# Patient Record
Sex: Female | Born: 1935 | Race: White | Hispanic: No | Marital: Married | Smoking: Former smoker
Health system: Southern US, Community
[De-identification: ages and names within clinical notes are randomized; demographics above are authoritative.]

## PROBLEM LIST (undated history)

## (undated) DIAGNOSIS — J439 Emphysema, unspecified: Secondary | ICD-10-CM

## (undated) DIAGNOSIS — J45909 Unspecified asthma, uncomplicated: Secondary | ICD-10-CM

## (undated) DIAGNOSIS — C3490 Malignant neoplasm of unspecified part of unspecified bronchus or lung: Secondary | ICD-10-CM

## (undated) DIAGNOSIS — E039 Hypothyroidism, unspecified: Secondary | ICD-10-CM

## (undated) DIAGNOSIS — F419 Anxiety disorder, unspecified: Secondary | ICD-10-CM

## (undated) DIAGNOSIS — E785 Hyperlipidemia, unspecified: Secondary | ICD-10-CM

## (undated) DIAGNOSIS — J189 Pneumonia, unspecified organism: Secondary | ICD-10-CM

## (undated) DIAGNOSIS — IMO0002 Reserved for concepts with insufficient information to code with codable children: Secondary | ICD-10-CM

## (undated) DIAGNOSIS — J42 Unspecified chronic bronchitis: Secondary | ICD-10-CM

## (undated) DIAGNOSIS — R197 Diarrhea, unspecified: Secondary | ICD-10-CM

## (undated) DIAGNOSIS — K219 Gastro-esophageal reflux disease without esophagitis: Secondary | ICD-10-CM

## (undated) DIAGNOSIS — N312 Flaccid neuropathic bladder, not elsewhere classified: Secondary | ICD-10-CM

## (undated) DIAGNOSIS — J449 Chronic obstructive pulmonary disease, unspecified: Secondary | ICD-10-CM

## (undated) DIAGNOSIS — Z9981 Dependence on supplemental oxygen: Secondary | ICD-10-CM

## (undated) DIAGNOSIS — M1990 Unspecified osteoarthritis, unspecified site: Secondary | ICD-10-CM

## (undated) HISTORY — DX: Unspecified osteoarthritis, unspecified site: M19.90

## (undated) HISTORY — PX: DILATION AND CURETTAGE OF UTERUS: SHX78

## (undated) HISTORY — DX: Emphysema, unspecified: J43.9

## (undated) HISTORY — DX: Hypothyroidism, unspecified: E03.9

## (undated) HISTORY — DX: Malignant neoplasm of unspecified part of unspecified bronchus or lung: C34.90

## (undated) HISTORY — DX: Unspecified asthma, uncomplicated: J45.909

## (undated) HISTORY — DX: Flaccid neuropathic bladder, not elsewhere classified: N31.2

## (undated) HISTORY — DX: Reserved for concepts with insufficient information to code with codable children: IMO0002

## (undated) HISTORY — PX: BACK SURGERY: SHX140

## (undated) HISTORY — PX: CATARACT EXTRACTION W/ INTRAOCULAR LENS  IMPLANT, BILATERAL: SHX1307

## (undated) HISTORY — DX: Hyperlipidemia, unspecified: E78.5

## (undated) HISTORY — DX: Chronic obstructive pulmonary disease, unspecified: J44.9

## (undated) HISTORY — PX: ABDOMINAL HYSTERECTOMY: SHX81

---

## 1969-02-18 HISTORY — PX: LUMBAR DISC SURGERY: SHX700

## 1999-08-19 ENCOUNTER — Encounter: Payer: Self-pay | Admitting: Obstetrics and Gynecology

## 1999-08-19 ENCOUNTER — Encounter: Admission: RE | Admit: 1999-08-19 | Discharge: 1999-08-19 | Payer: Self-pay | Admitting: Obstetrics and Gynecology

## 1999-11-01 ENCOUNTER — Other Ambulatory Visit: Admission: RE | Admit: 1999-11-01 | Discharge: 1999-11-01 | Payer: Self-pay | Admitting: Obstetrics and Gynecology

## 2000-11-27 ENCOUNTER — Encounter: Admission: RE | Admit: 2000-11-27 | Discharge: 2000-11-27 | Payer: Self-pay | Admitting: Obstetrics and Gynecology

## 2000-11-27 ENCOUNTER — Encounter: Payer: Self-pay | Admitting: Obstetrics and Gynecology

## 2001-06-05 ENCOUNTER — Encounter: Payer: Self-pay | Admitting: Internal Medicine

## 2001-06-05 ENCOUNTER — Encounter: Admission: RE | Admit: 2001-06-05 | Discharge: 2001-06-05 | Payer: Self-pay | Admitting: Internal Medicine

## 2001-11-28 ENCOUNTER — Encounter: Payer: Self-pay | Admitting: Obstetrics and Gynecology

## 2001-11-28 ENCOUNTER — Encounter: Admission: RE | Admit: 2001-11-28 | Discharge: 2001-11-28 | Payer: Self-pay | Admitting: Obstetrics and Gynecology

## 2001-12-04 ENCOUNTER — Encounter: Admission: RE | Admit: 2001-12-04 | Discharge: 2001-12-04 | Payer: Self-pay | Admitting: Obstetrics and Gynecology

## 2001-12-04 ENCOUNTER — Encounter: Payer: Self-pay | Admitting: Obstetrics and Gynecology

## 2002-12-10 ENCOUNTER — Encounter: Admission: RE | Admit: 2002-12-10 | Discharge: 2002-12-10 | Payer: Self-pay | Admitting: Obstetrics and Gynecology

## 2002-12-10 ENCOUNTER — Encounter: Payer: Self-pay | Admitting: Obstetrics and Gynecology

## 2003-06-21 DIAGNOSIS — C3490 Malignant neoplasm of unspecified part of unspecified bronchus or lung: Secondary | ICD-10-CM

## 2003-06-21 HISTORY — PX: LYMPH NODE DISSECTION: SHX5087

## 2003-06-21 HISTORY — DX: Malignant neoplasm of unspecified part of unspecified bronchus or lung: C34.90

## 2003-08-22 ENCOUNTER — Encounter: Admission: RE | Admit: 2003-08-22 | Discharge: 2003-08-22 | Payer: Self-pay | Admitting: Internal Medicine

## 2003-09-11 ENCOUNTER — Ambulatory Visit (HOSPITAL_COMMUNITY): Admission: RE | Admit: 2003-09-11 | Discharge: 2003-09-11 | Payer: Self-pay | Admitting: Thoracic Surgery

## 2003-09-19 HISTORY — PX: VIDEO ASSISTED THORACOSCOPY (VATS)/THOROCOTOMY: SHX6173

## 2003-09-22 ENCOUNTER — Encounter (INDEPENDENT_AMBULATORY_CARE_PROVIDER_SITE_OTHER): Payer: Self-pay | Admitting: *Deleted

## 2003-09-22 ENCOUNTER — Inpatient Hospital Stay (HOSPITAL_COMMUNITY): Admission: RE | Admit: 2003-09-22 | Discharge: 2003-09-28 | Payer: Self-pay | Admitting: Thoracic Surgery

## 2003-10-03 ENCOUNTER — Encounter: Admission: RE | Admit: 2003-10-03 | Discharge: 2003-10-03 | Payer: Self-pay | Admitting: Thoracic Surgery

## 2003-10-23 ENCOUNTER — Encounter: Admission: RE | Admit: 2003-10-23 | Discharge: 2003-10-23 | Payer: Self-pay | Admitting: Thoracic Surgery

## 2003-10-26 ENCOUNTER — Inpatient Hospital Stay (HOSPITAL_COMMUNITY): Admission: EM | Admit: 2003-10-26 | Discharge: 2003-10-29 | Payer: Self-pay | Admitting: Emergency Medicine

## 2003-11-06 ENCOUNTER — Encounter: Admission: RE | Admit: 2003-11-06 | Discharge: 2003-11-06 | Payer: Self-pay | Admitting: Thoracic Surgery

## 2003-11-13 ENCOUNTER — Encounter: Admission: RE | Admit: 2003-11-13 | Discharge: 2003-11-13 | Payer: Self-pay | Admitting: Thoracic Surgery

## 2003-12-04 ENCOUNTER — Encounter: Admission: RE | Admit: 2003-12-04 | Discharge: 2003-12-04 | Payer: Self-pay | Admitting: Thoracic Surgery

## 2003-12-15 ENCOUNTER — Encounter: Admission: RE | Admit: 2003-12-15 | Discharge: 2003-12-15 | Payer: Self-pay | Admitting: Internal Medicine

## 2004-01-14 ENCOUNTER — Encounter: Admission: RE | Admit: 2004-01-14 | Discharge: 2004-01-14 | Payer: Self-pay | Admitting: Thoracic Surgery

## 2004-03-15 ENCOUNTER — Ambulatory Visit (HOSPITAL_COMMUNITY): Admission: RE | Admit: 2004-03-15 | Discharge: 2004-03-15 | Payer: Self-pay | Admitting: Oncology

## 2004-03-16 ENCOUNTER — Encounter: Admission: RE | Admit: 2004-03-16 | Discharge: 2004-03-16 | Payer: Self-pay | Admitting: Thoracic Surgery

## 2004-03-20 HISTORY — PX: VIDEO BRONCHOSCOPY: SHX5072

## 2004-03-31 ENCOUNTER — Ambulatory Visit (HOSPITAL_COMMUNITY): Admission: RE | Admit: 2004-03-31 | Discharge: 2004-03-31 | Payer: Self-pay | Admitting: Thoracic Surgery

## 2004-03-31 ENCOUNTER — Encounter (INDEPENDENT_AMBULATORY_CARE_PROVIDER_SITE_OTHER): Payer: Self-pay | Admitting: Specialist

## 2004-04-07 ENCOUNTER — Ambulatory Visit (HOSPITAL_COMMUNITY): Admission: RE | Admit: 2004-04-07 | Discharge: 2004-04-07 | Payer: Self-pay | Admitting: Oncology

## 2004-04-28 ENCOUNTER — Ambulatory Visit: Payer: Self-pay | Admitting: Oncology

## 2004-06-29 ENCOUNTER — Encounter: Admission: RE | Admit: 2004-06-29 | Discharge: 2004-06-29 | Payer: Self-pay | Admitting: Thoracic Surgery

## 2004-07-20 ENCOUNTER — Ambulatory Visit: Payer: Self-pay | Admitting: Oncology

## 2004-07-21 ENCOUNTER — Ambulatory Visit (HOSPITAL_COMMUNITY): Admission: RE | Admit: 2004-07-21 | Discharge: 2004-07-21 | Payer: Self-pay | Admitting: Oncology

## 2004-08-27 ENCOUNTER — Encounter: Admission: RE | Admit: 2004-08-27 | Discharge: 2004-08-27 | Payer: Self-pay | Admitting: Internal Medicine

## 2004-09-28 ENCOUNTER — Encounter: Admission: RE | Admit: 2004-09-28 | Discharge: 2004-09-28 | Payer: Self-pay | Admitting: Thoracic Surgery

## 2004-11-23 ENCOUNTER — Ambulatory Visit: Payer: Self-pay | Admitting: Oncology

## 2004-11-24 ENCOUNTER — Ambulatory Visit (HOSPITAL_COMMUNITY): Admission: RE | Admit: 2004-11-24 | Discharge: 2004-11-24 | Payer: Self-pay | Admitting: Oncology

## 2004-12-27 ENCOUNTER — Encounter: Admission: RE | Admit: 2004-12-27 | Discharge: 2004-12-27 | Payer: Self-pay | Admitting: Internal Medicine

## 2005-03-29 ENCOUNTER — Ambulatory Visit: Payer: Self-pay | Admitting: Oncology

## 2005-03-30 ENCOUNTER — Encounter: Admission: RE | Admit: 2005-03-30 | Discharge: 2005-03-30 | Payer: Self-pay | Admitting: Thoracic Surgery

## 2005-04-01 ENCOUNTER — Encounter: Admission: RE | Admit: 2005-04-01 | Discharge: 2005-04-01 | Payer: Self-pay | Admitting: Oncology

## 2005-04-05 IMAGING — CT NM MISC PROCEDURE
5 series · 25 of 25 positions shown · IV contrast ([ID])
Comparison: None.

CLINICAL DATA: Right upper lobe pulmonary nodule seen on CT.
 FDG PET-CT TUMOR IMAGING (SKULL BASE TO THIGHS)
TECHNIQUE: 16.5 mCi F-18 FDG were administered via the left arm.  Full-ring PET imaging was performed from the skull base through the mid-thighs 55 minutes after injection.  CT data was obtained and used for attenuation correction and anatomic localization only.  (This was not acquired as a diagnostic CT examination.)

[Series 1: pet ac · axial · 3.3mm · 4.69mm/px · z∈[-870,+0]mm · 7 of 267 slices shown]
[im 1/267]
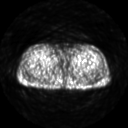
[im 45/267]
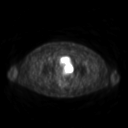
[im 89/267]
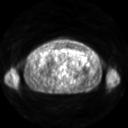
[im 134/267]
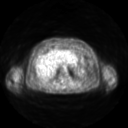
[im 178/267]
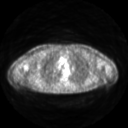
[im 222/267]
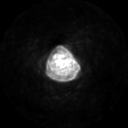
[im 267/267]
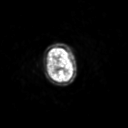

[Series 2: ct images · axial · 3.8mm · 0.98mm/px · z∈[-870,+0]mm · 8 of 267 slices shown]
[im 1/267  soft-tissue]
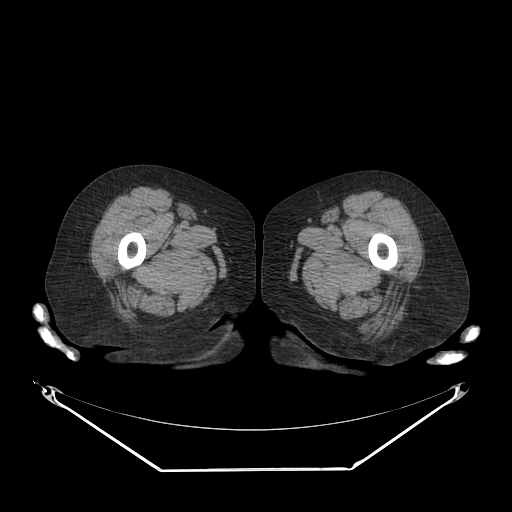
[im 39/267  soft-tissue]
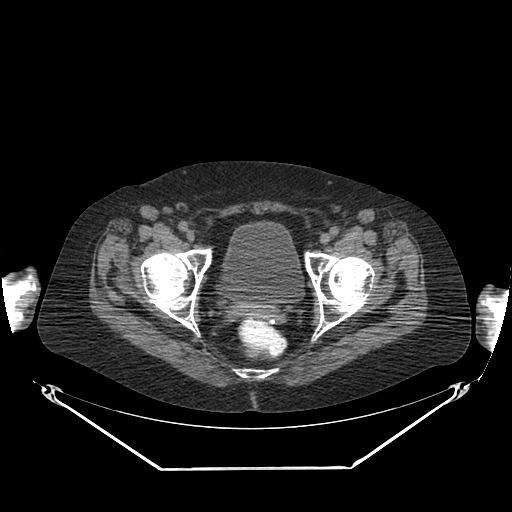
[im 77/267  soft-tissue]
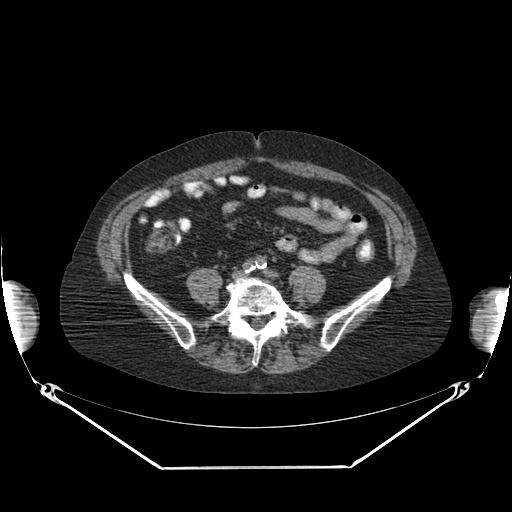
[im 115/267  soft-tissue]
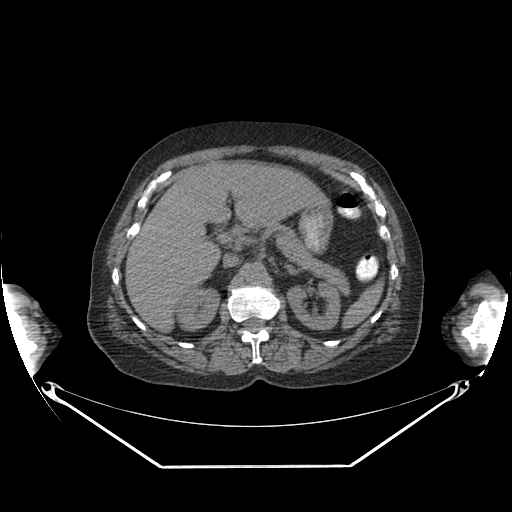
[im 153/267  soft-tissue]
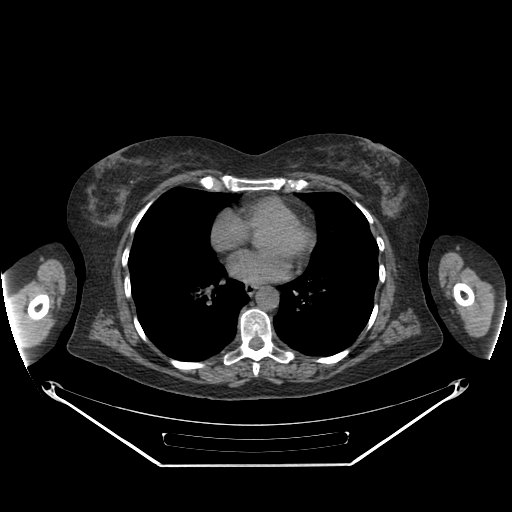
[im 191/267  soft-tissue]
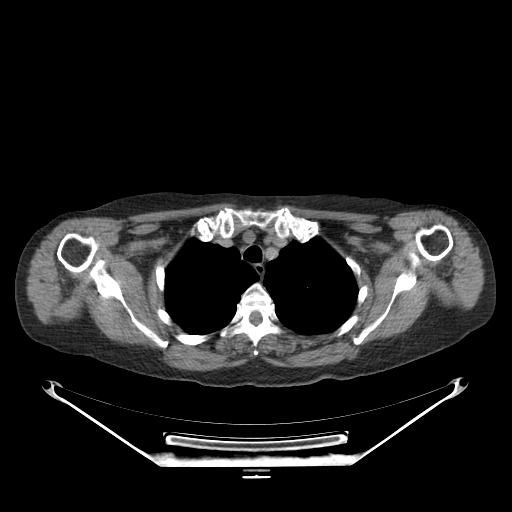
[im 229/267  soft-tissue]
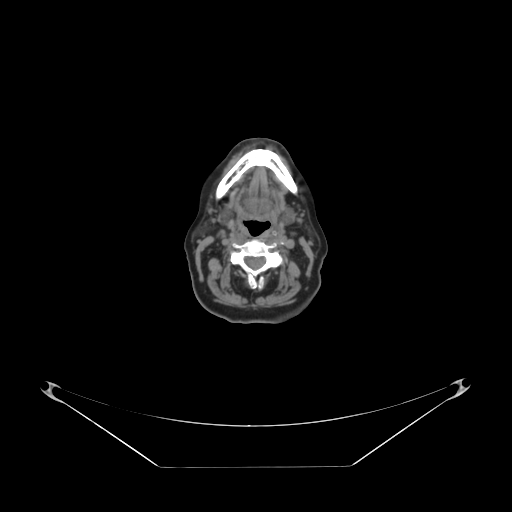
[im 267/267  soft-tissue]
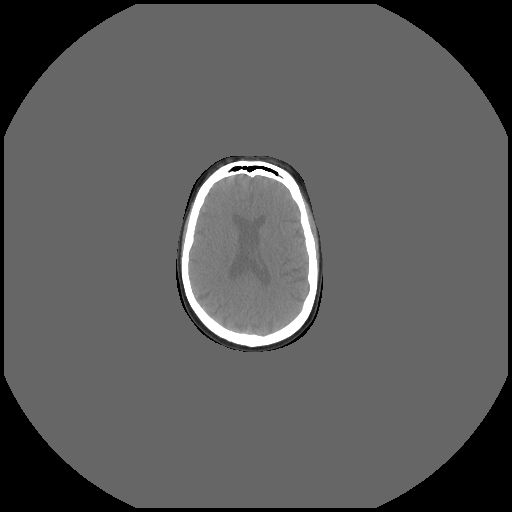

[Series 2: pet nac · axial · 3.3mm · 4.69mm/px · z∈[-870,+0]mm · 8 of 267 slices shown]
[im 1/267]
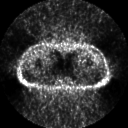
[im 39/267]
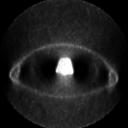
[im 77/267]
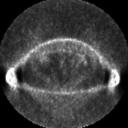
[im 115/267]
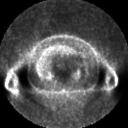
[im 153/267]
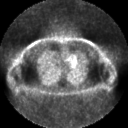
[im 191/267]
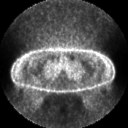
[im 229/267]
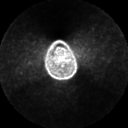
[im 267/267]
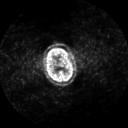

[Series 100: reformatted · axial · 3.8mm · 0.49mm/px · 1 of 1 slices shown]
[im 1/1  soft-tissue]
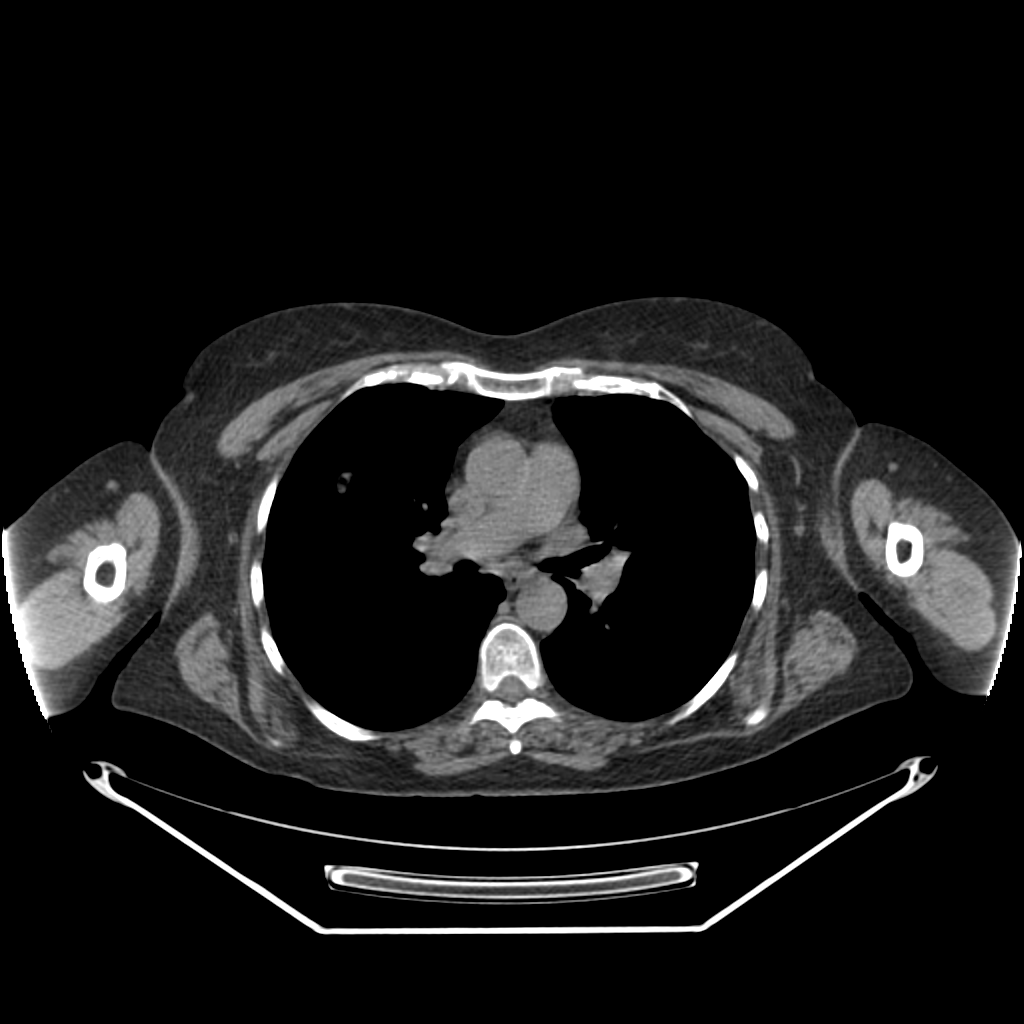

[Series 100: average · axial · 3.3mm · 1.17mm/px · 1 of 2 slices shown]
[im 1/2]
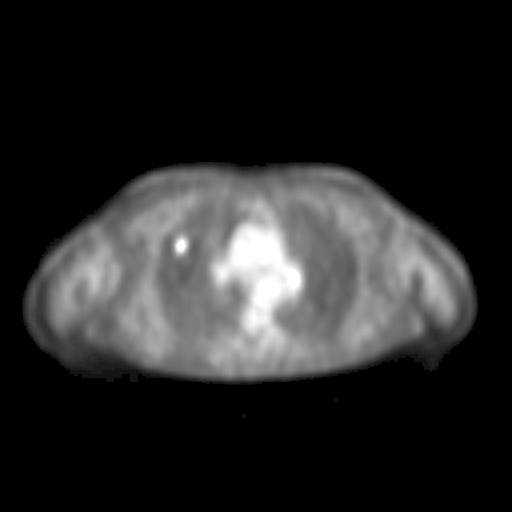

[25 of 25 positions shown; findings below may reference images not displayed]

FINDINGS: A focal area of increased FDG activity is seen corresponding to the spiculated right upper lobe nodule seen by CT.  This has a maximum SUV of 7.7 g/mL, and is suspicious for malignancy.
 There is no evidence of abnormal FDG activity within the hilar or mediastinal regions or elsewhere in the thorax.  There is no evidence of abnormal FDG activity elsewhere within the neck, abdomen, or pelvis. 
 IMPRESSION
 1.  Malignant-range FDG uptake in spiculated right upper lobe nodule seen on prior CT.  This is suspicious for primary bronchogenic carcinoma. 
 2.  No evidence of local or distant metastatic disease.

## 2005-04-16 IMAGING — CR DG CHEST 2V
2 series · 2 of 2 positions shown · non-contrast
Comparison: none

CLINICAL DATA: Right upper lobe mass, cough, bronchitis.  Recent ex-smoker.
 TWO VIEW CHEST
 Normal sized heart.  Hyperexpanded lungs with mild diffuse accentuation of the interstitial markings and mild central peribronchial thickening.  The recently demonstrated right upper lobe nodule, seen at CT on 08/22/03, is poorly visualized, measuring 1.3 cm in maximum diameter.  Mild thoracic spine degenerative changes.
 IMPRESSION
 1.  1.3 cm nodule in the inferior aspect of the right upper lobe. 
 2.  Changes of COPD and chronic bronchitis.

[view not recorded (1 of 2)]
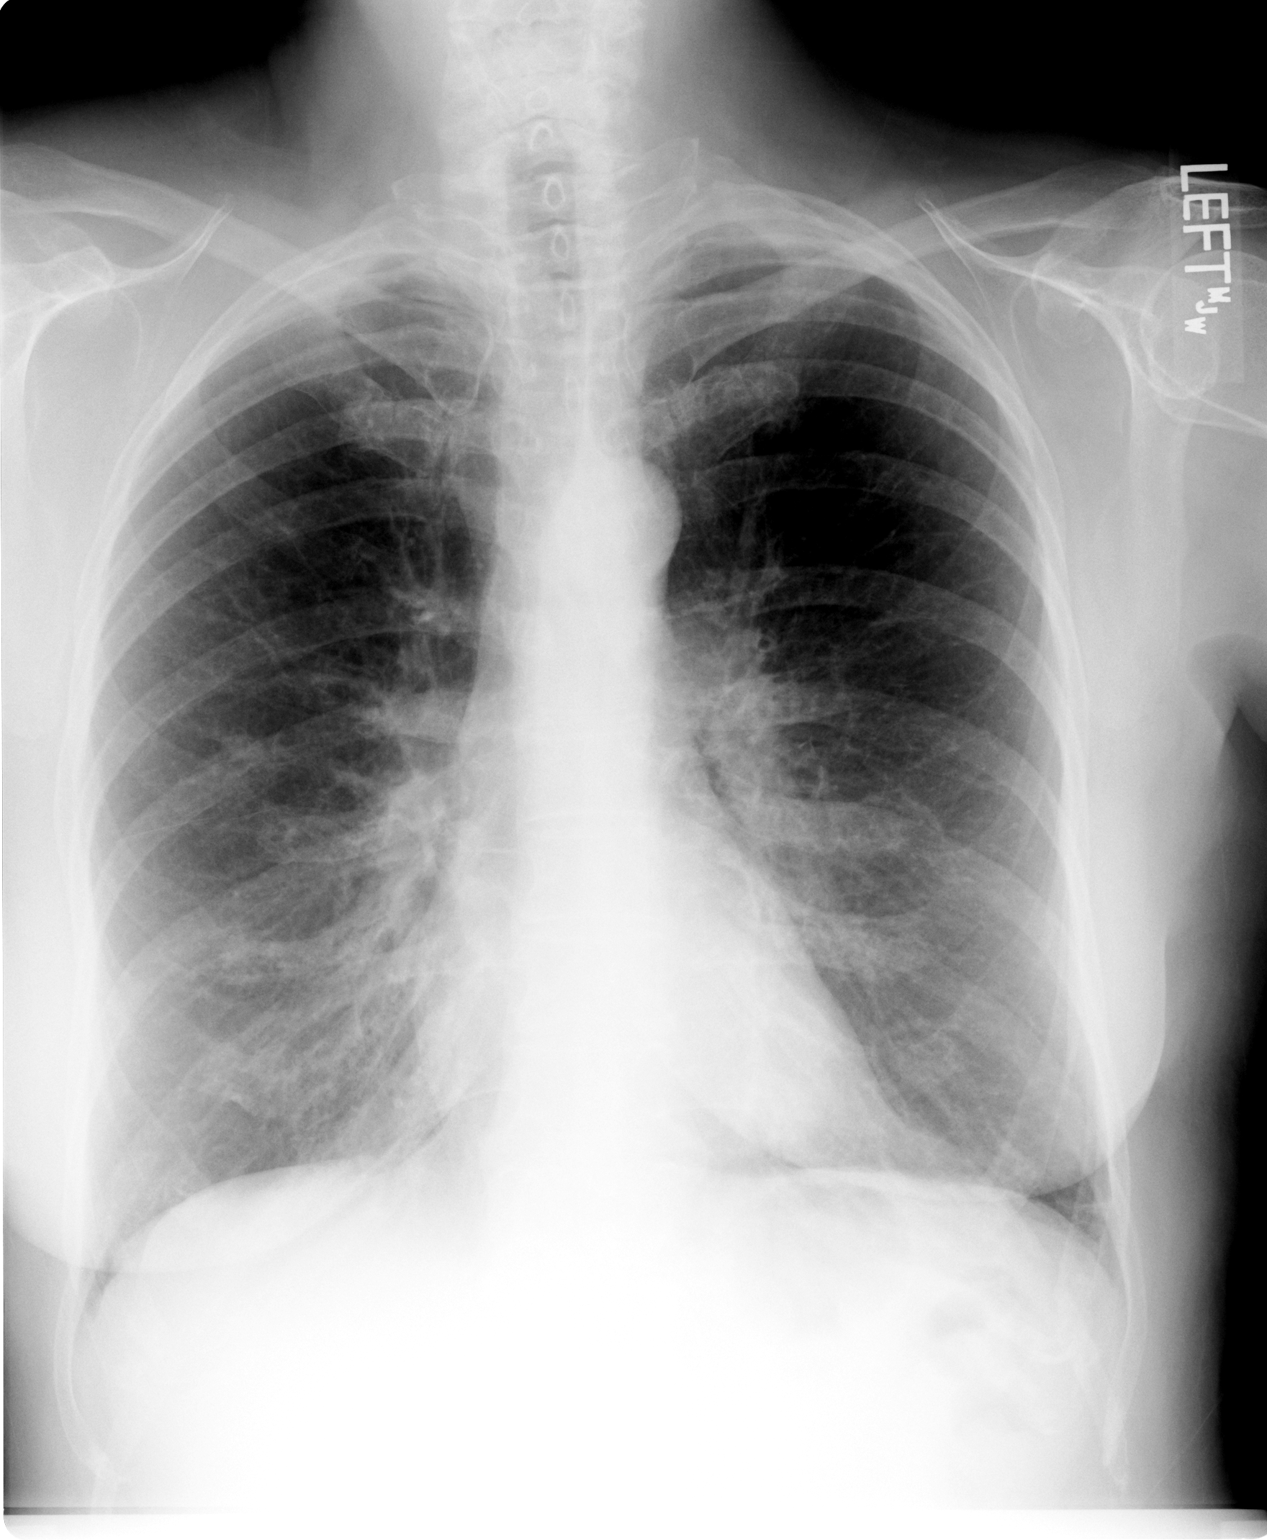

[view not recorded (2 of 2)]
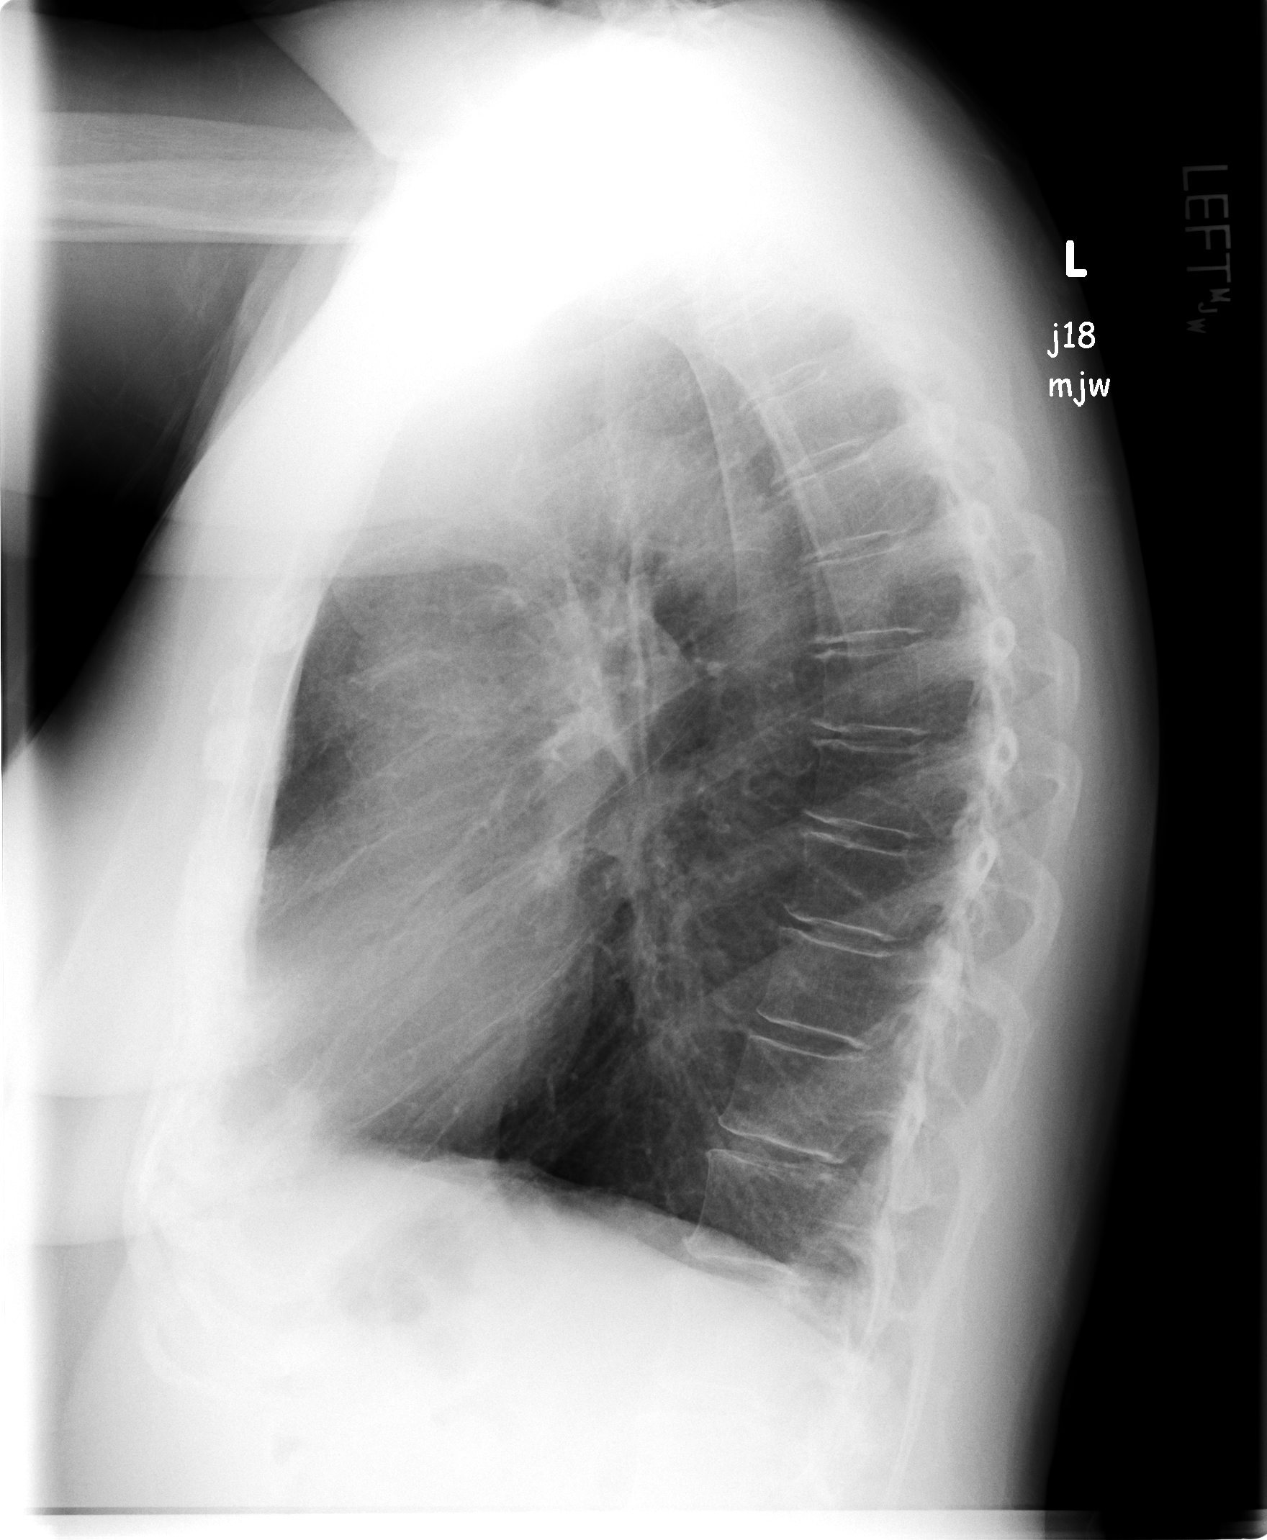

[2 of 2 positions shown; findings below may reference images not displayed]

## 2005-04-16 IMAGING — CR DG CHEST 1V PORT
1 series · 1 of 1 positions shown · non-contrast
Comparison: none

CLINICAL DATA: Post right VATS. 
 PORTABLE CHEST ? 09/22/03 ([DATE] hours)
 Compared to a portable film from earlier today, right VATS has been performed.  Two right chest tubes are present.   No definite pneumothorax is noted. 
 IMPRESSION 
 Two right chest tubes are present status-post right VATS. No pneumothorax.

[view not recorded]
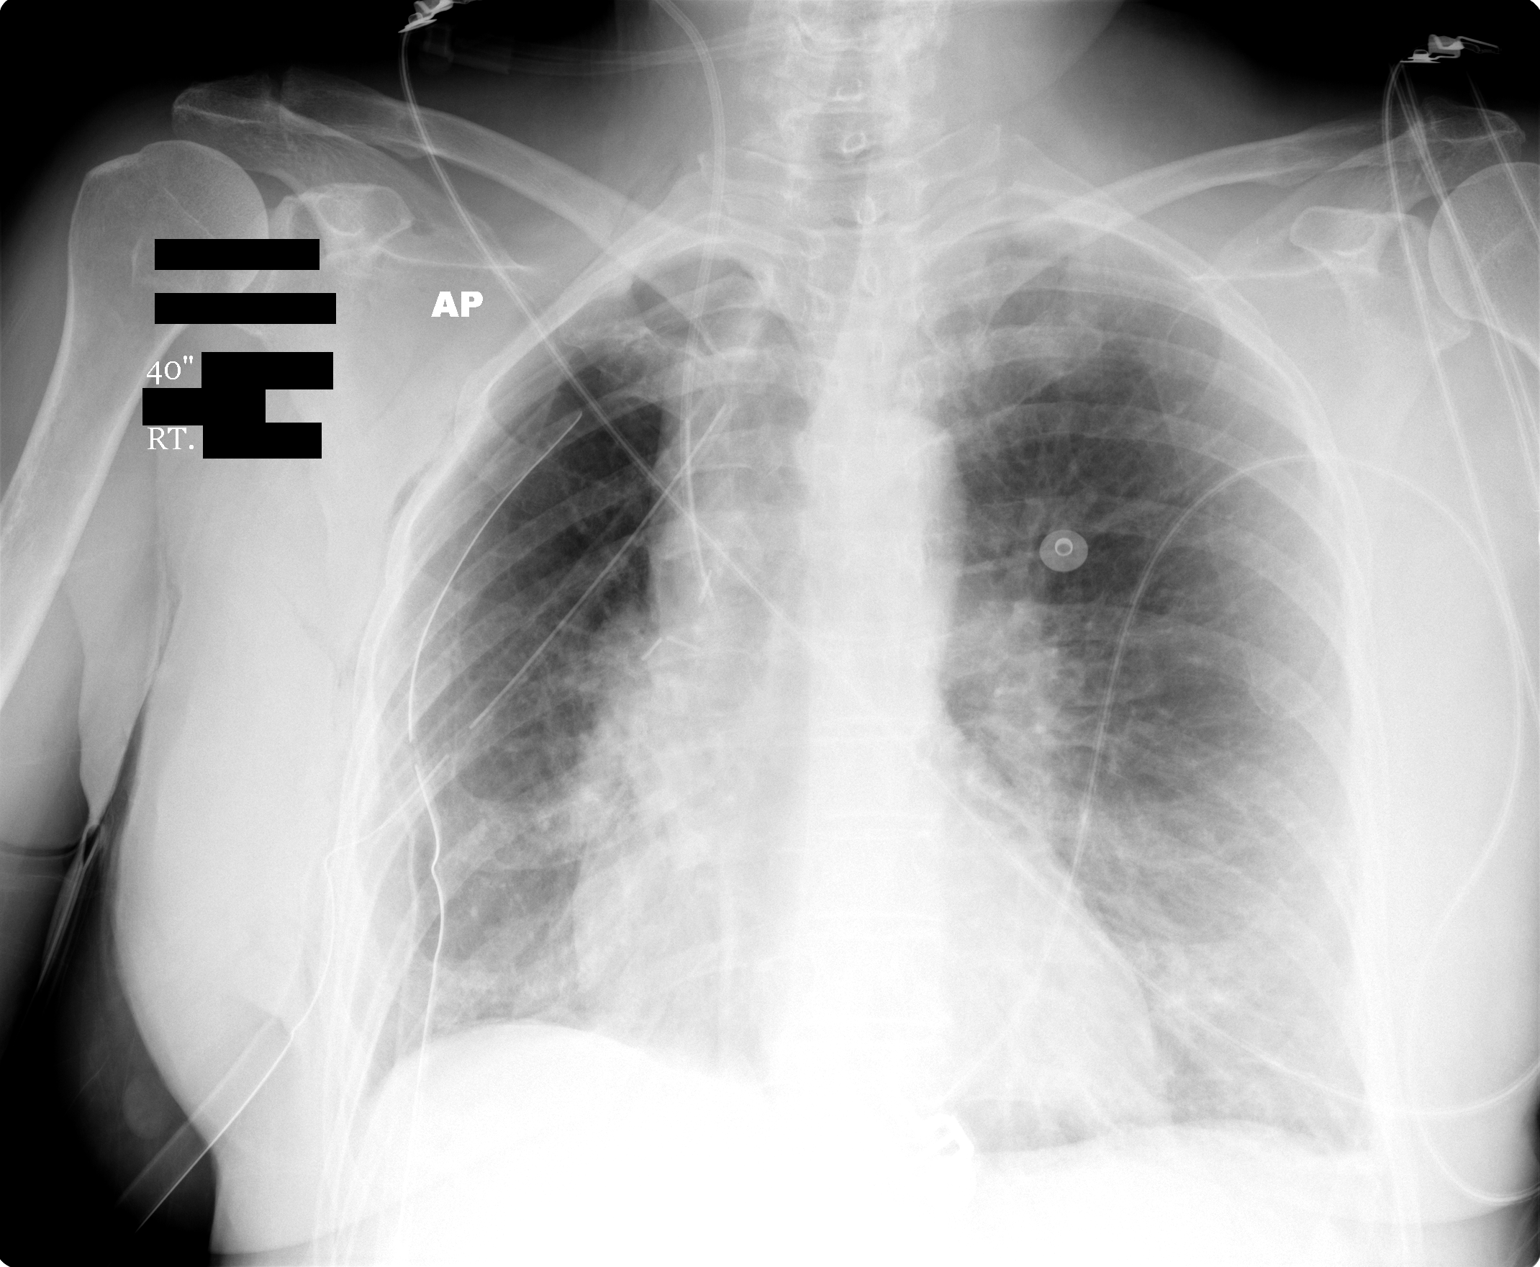

[1 of 1 positions shown; findings below may reference images not displayed]

## 2005-04-17 IMAGING — CR DG CHEST 1V PORT
1 series · 1 of 1 positions shown · non-contrast
Comparison: Portable chest yesterday.

CLINICAL DATA: Postop right upper lobectomy for lung mass.
 PORTABLE CHEST, ONE VIEW ? 09/23/03, 2092 HOURS

[view not recorded]
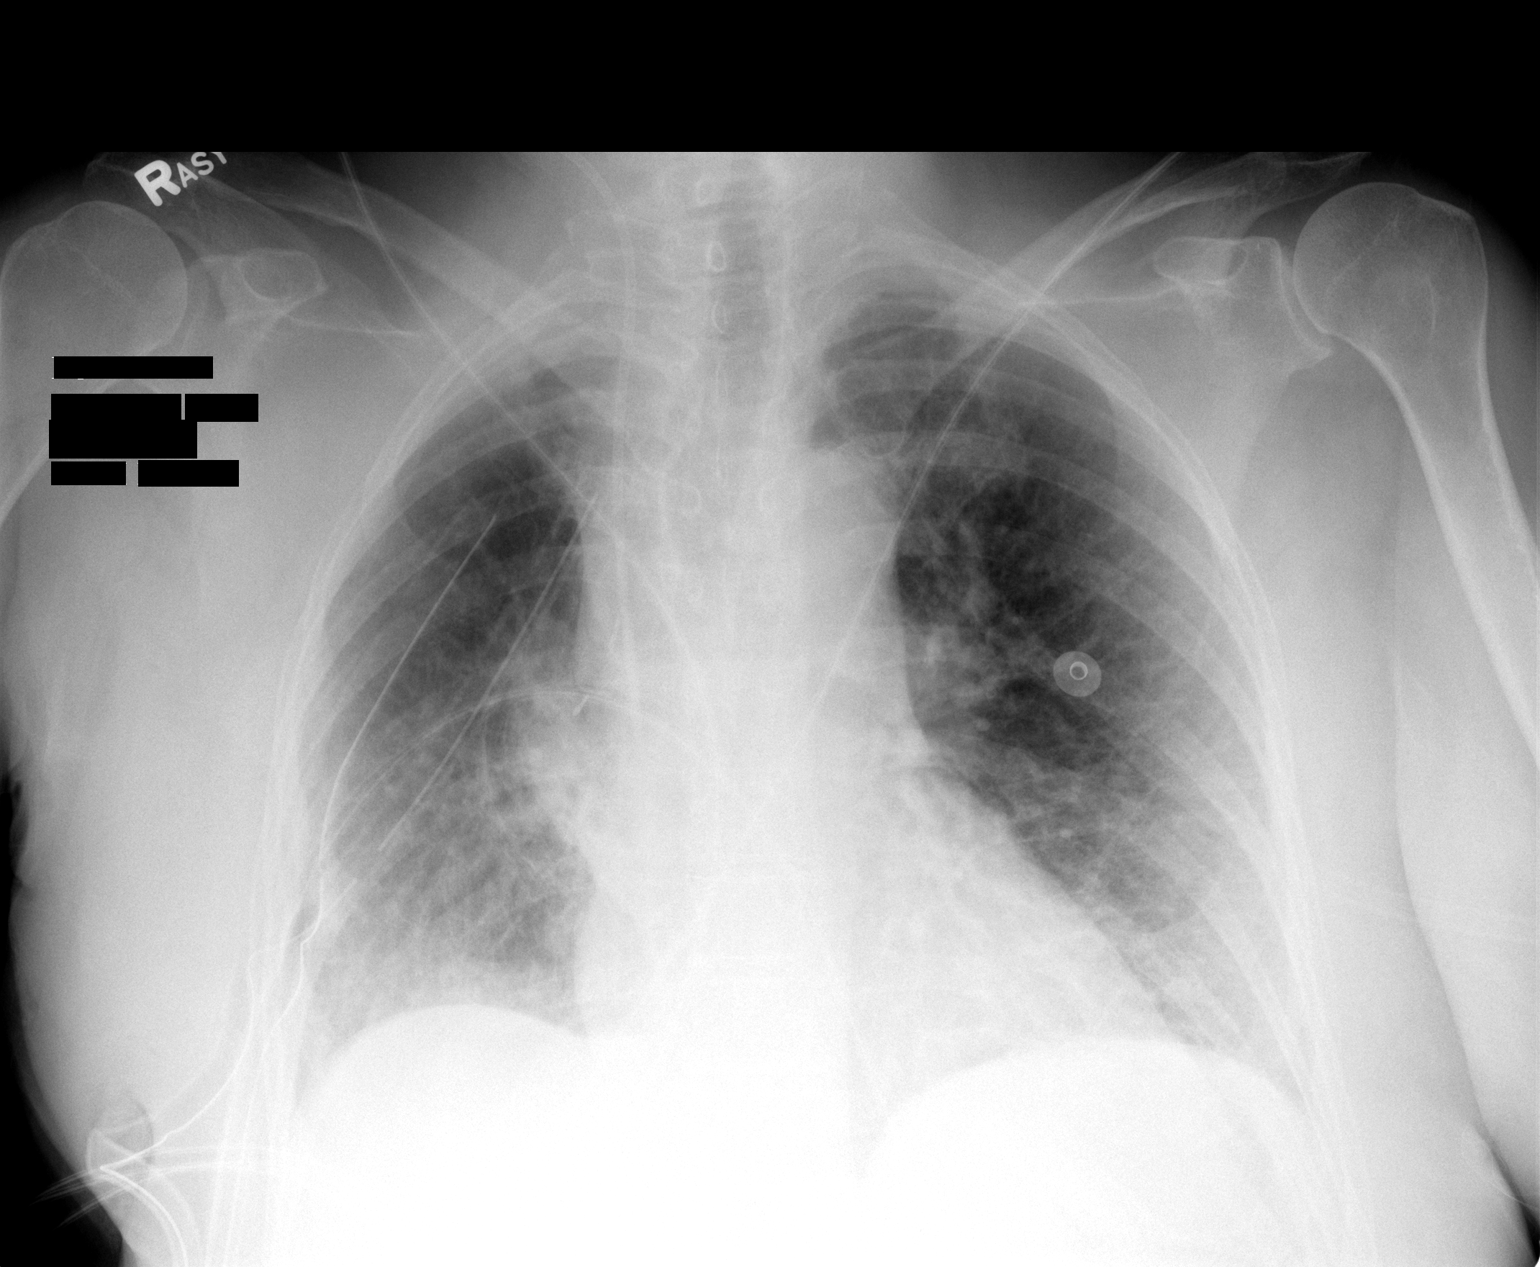

[1 of 1 positions shown; findings below may reference images not displayed]

Two right chest tubes remain in place with no pneumothorax.  Inspiration is suboptimal on the current examination.  I believe this accounts for the crowded bronchovascular markings diffusely.  There are no confluent areas of consolidation.  Right jugular central venous catheter tip remains in the SVC.
IMPRESSION: Tubes and lines satisfactory.  No pneumothorax.  Suboptimal inspiration causing mild bibasilar atelectasis.

## 2005-04-18 IMAGING — CR DG CHEST 1V PORT
1 series · 1 of 1 positions shown · non-contrast
Comparison: none

CLINICAL DATA: Status-post lung surgery.  Followup.
 PORTABLE CHEST, ONE VIEW ([DATE] HOURS) 
 Compared to 09/23/03.  Two right thoracostomy tubes without pneumothorax.  Right jugular central venous catheter tip SVC.  Volume loss right chest with mediastinal shift to right.  Diffuse infiltrates bilaterally, probably mild edema. 
 IMPRESSION
 Probable mild diffuse edema.  No pneumothorax.

[view not recorded]
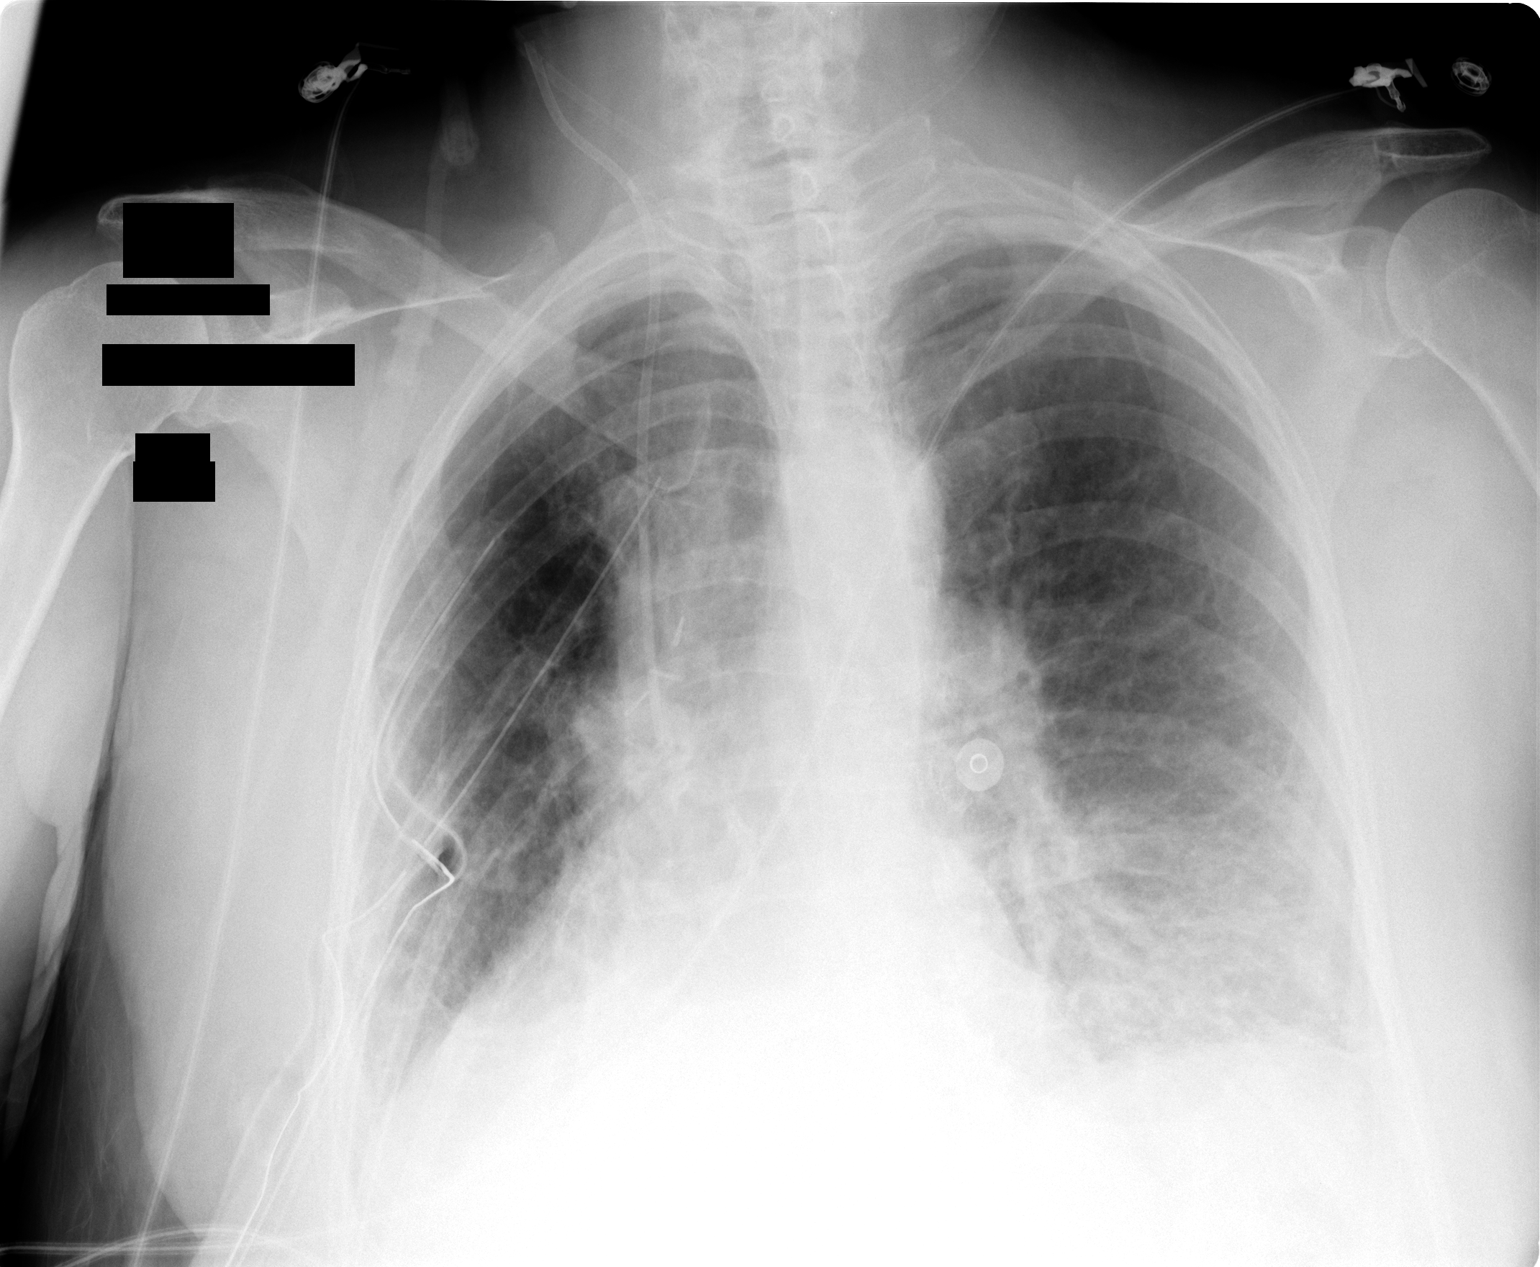

[1 of 1 positions shown; findings below may reference images not displayed]

## 2005-04-19 IMAGING — CR DG CHEST 1V PORT
1 series · 1 of 1 positions shown · non-contrast
Comparison: 09/24/03.

CLINICAL DATA: Right upper lobe mass.  Check on chest tubes.
 CHEST, PORTABLE ONE VIEW ? 09/25/03, 0020

[view not recorded]
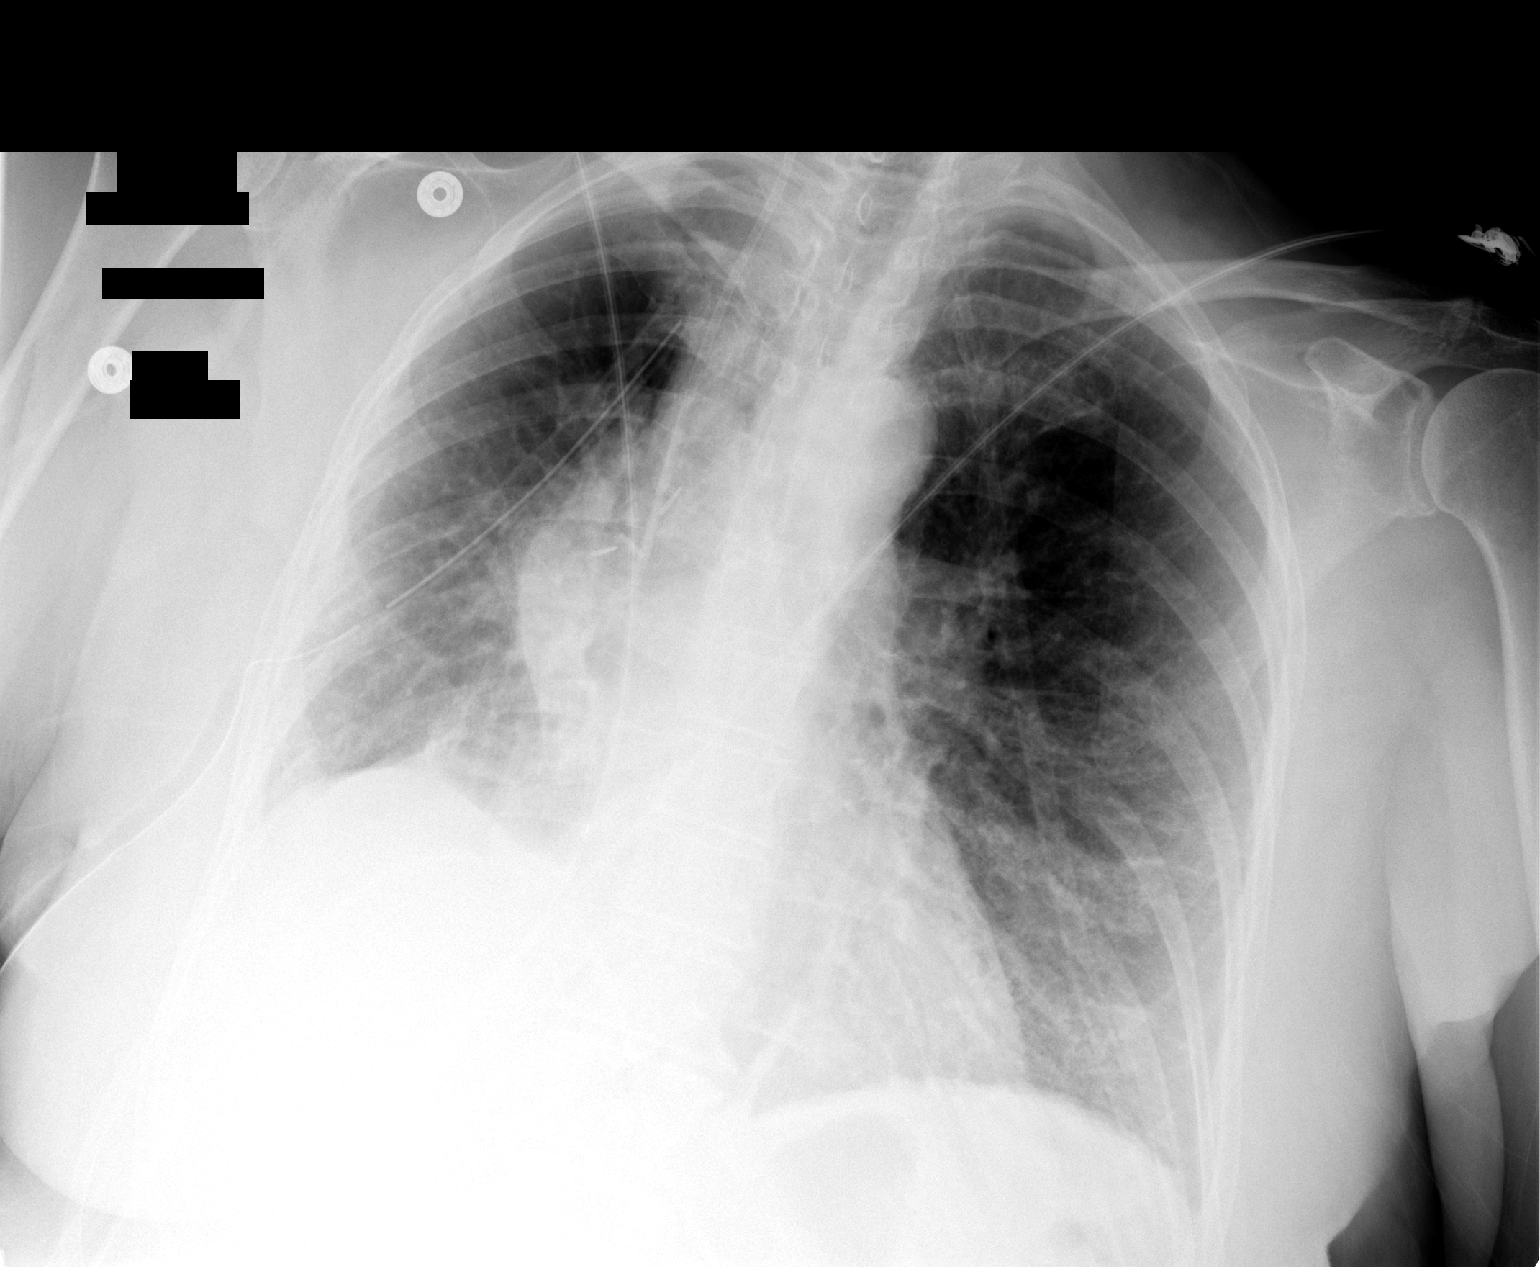

[1 of 1 positions shown; findings below may reference images not displayed]

Right-sided chest tube remains in place, unchanged.  No pneumothorax.  Postoperative changes are seen in the right hilum.  Slight improved aeration in the right lung base, compatible with a decrease in atelectasis.  There may be mild edema still present.
IMPRESSION: Slight improved aeration in the right base.  Otherwise no change.

## 2005-04-19 IMAGING — CR DG CHEST 1V PORT
1 series · 1 of 1 positions shown · non-contrast
Comparison: none

CLINICAL DATA: 70-year-old, chest tube removal.
 PORTABLE CHEST
 A single portable view of the chest is compared to an earlier film from the same date.  The right-sided chest tube has been removed.  No definite pneumothorax is seen.  The right IJ catheter is stable.  The heart and mediastinal contours are stable.
 IMPRESSION 
 Removal of the right-sided chest tube with no definite pneumothorax.

[view not recorded]
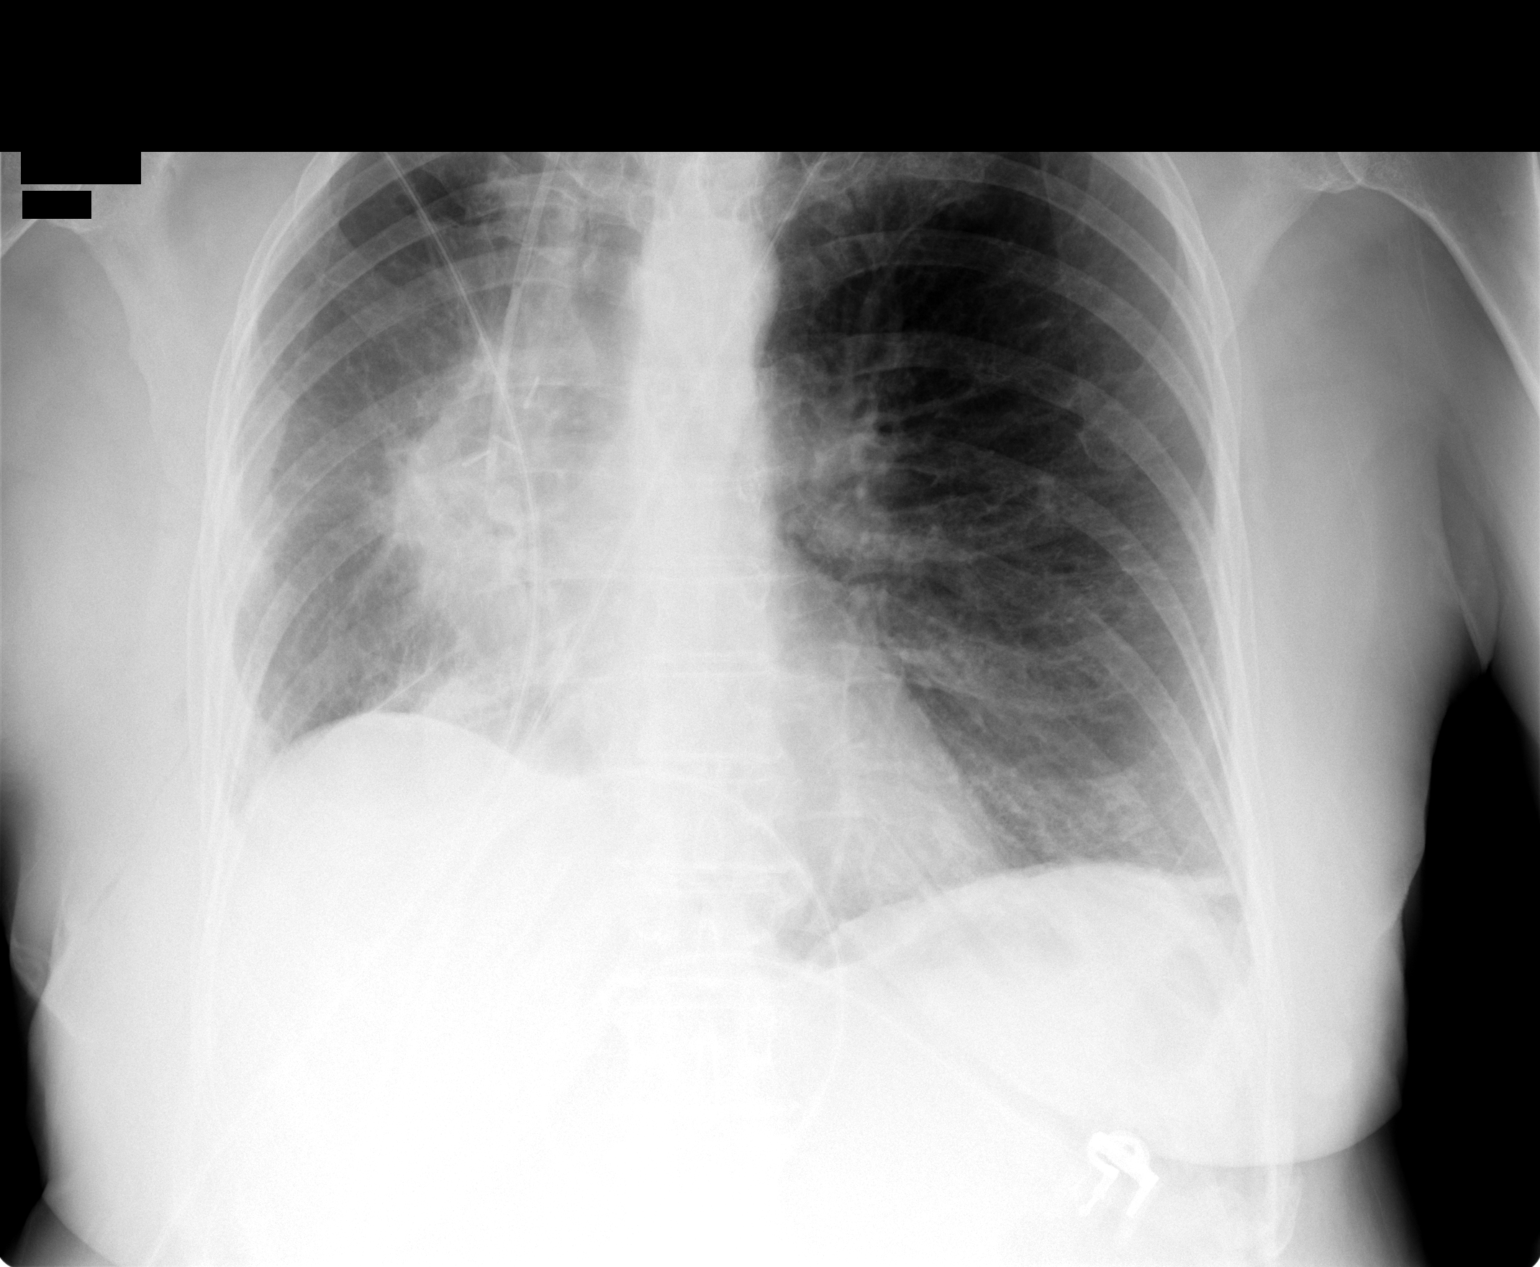

[1 of 1 positions shown; findings below may reference images not displayed]

## 2005-04-20 IMAGING — CR DG CHEST 2V
2 series · 2 of 2 positions shown · non-contrast
Comparison: none

CLINICAL DATA: Lung lesion. Follow-up.
 TWO VIEW CHEST 
 Comparison 09/25/03.
 Right jugular central venous catheter unchanged.  Heart size normal.  Volume loss right chest, right basilar atelectasis and enlarged hilum. Bronchitic changes and underlying COPD. Right pleural effusion. Overall no significant interval change.  Small loculated hilar pneumothorax at posterior upper right chest stable.
 IMPRESSION
 No interval change.

[view not recorded (1 of 2)]
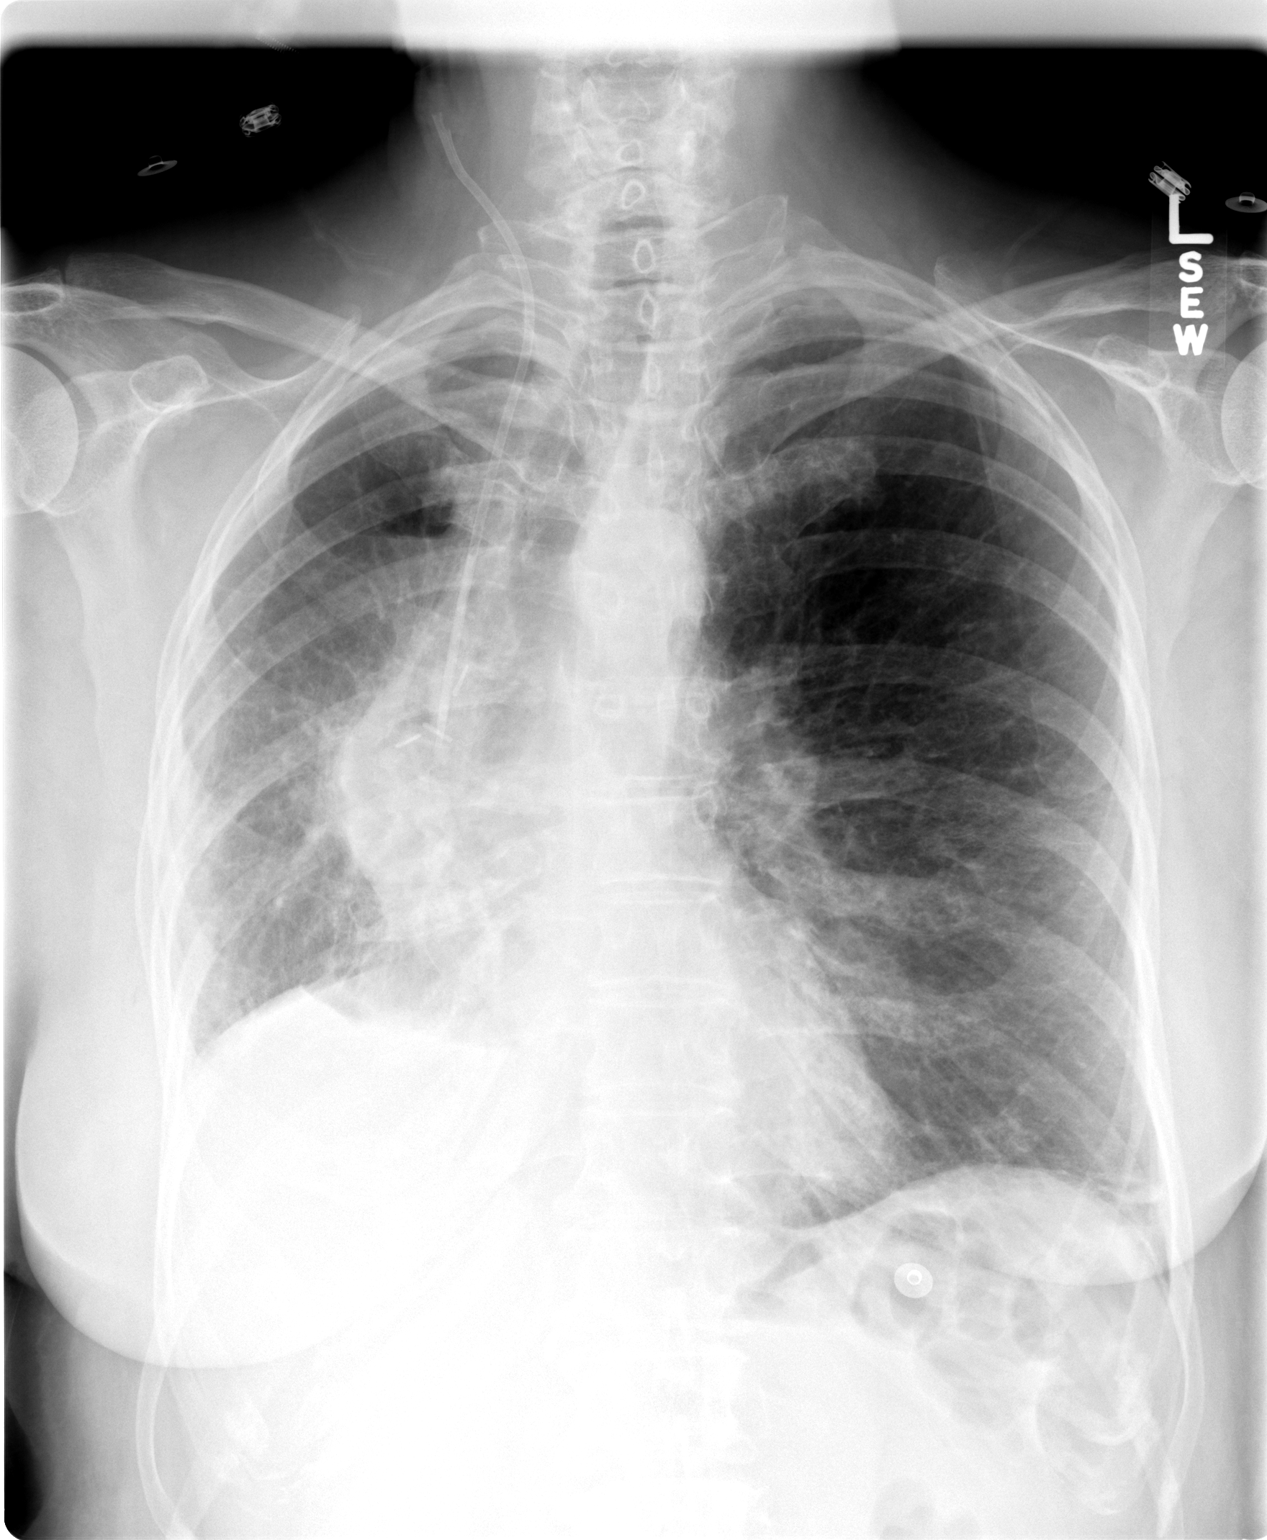

[view not recorded (2 of 2)]
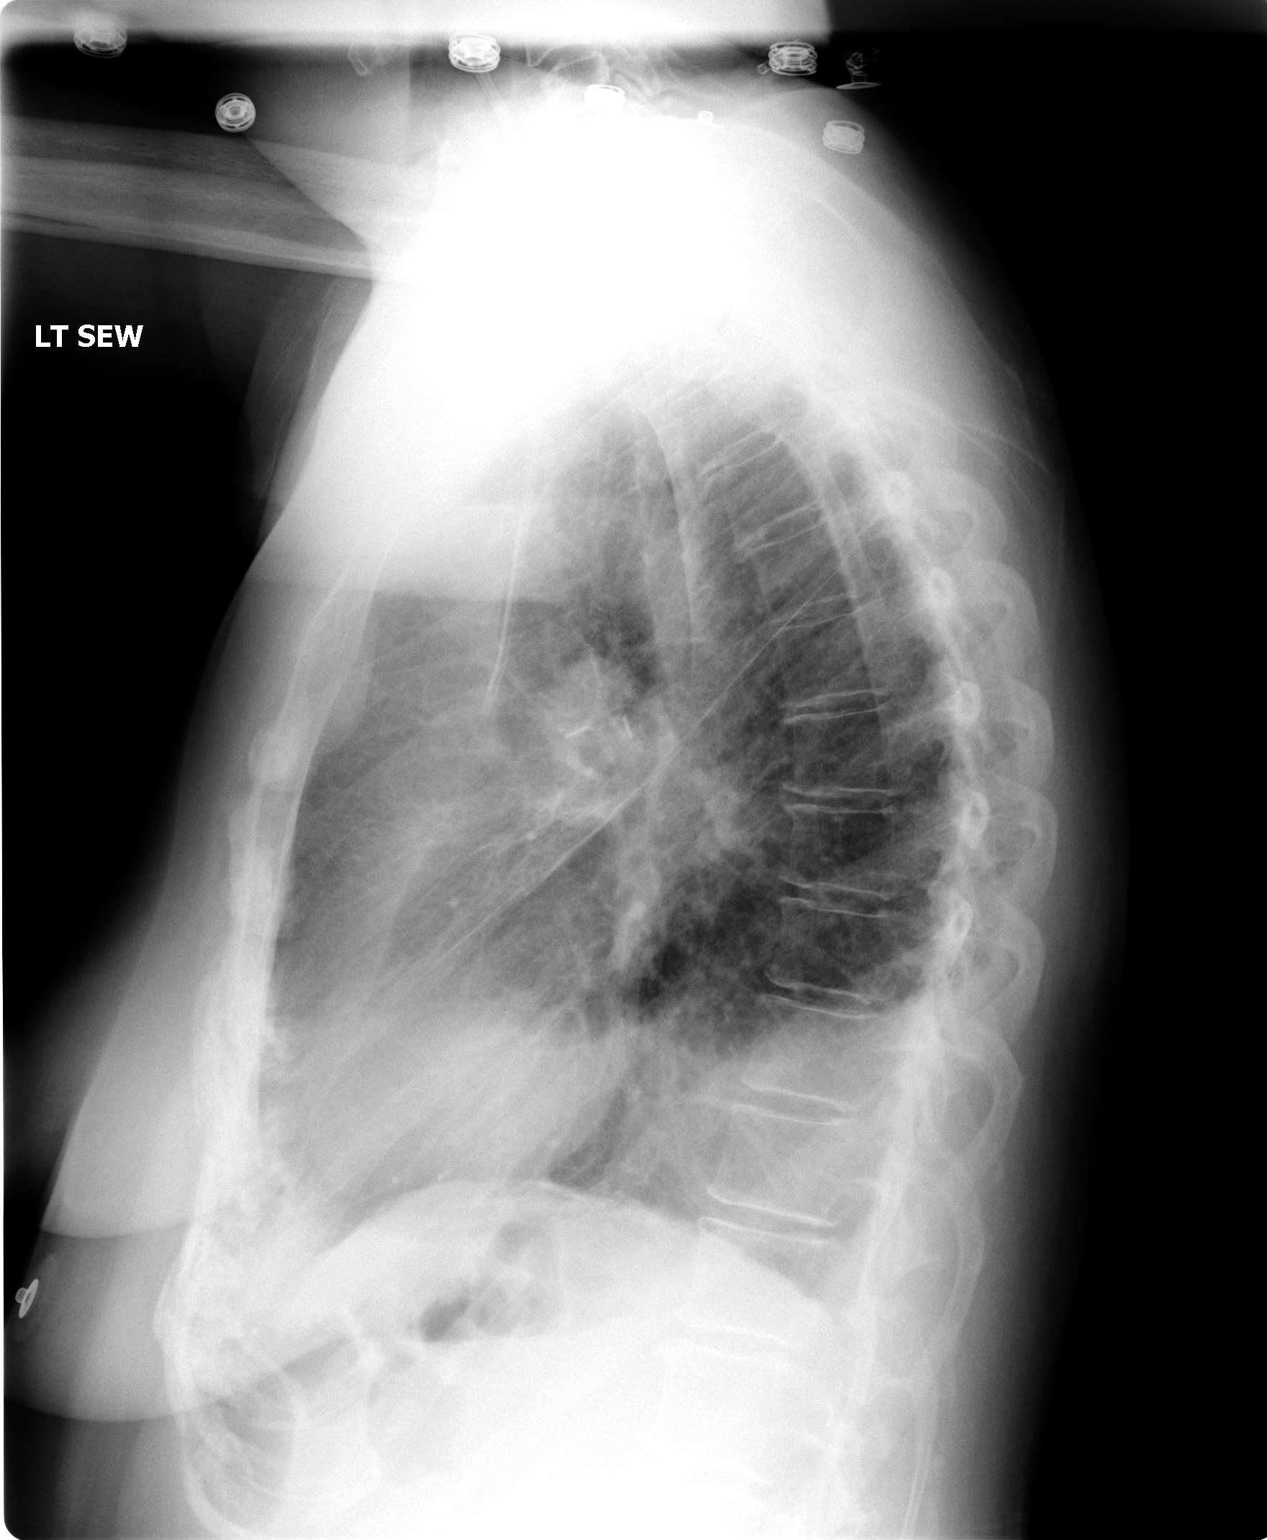

[2 of 2 positions shown; findings below may reference images not displayed]

## 2005-04-20 IMAGING — CT CT HEAD WO/W CM
2 of 3 series · 17 of 30 positions shown, 20 images · IV contrast (100 ML OMNI 300)
Comparison: none

CLINICAL DATA: Diagnosis of squamous cell carcinoma of the lung.
 COMPUTERIZED CRANIAL TOMOGRAPHY, WITH AND WITHOUT CONTRAST ?09/26/03
 Scans were performed before and after intravenous administration of 100 cc of Omnipaque 300.  
 There is no evidence of hemorrhage, infarction, mass lesion, or other significant abnormality.  There is no pathologic enhancement after intravenous contrast administration.  The ventricles are normal in size and configuration.  The internal auditory canals are slightly asymmetric but there is no evidence of a mass or an enhancement in those regions after contrast administration. 
 IMPRESSION 
 No evidence of metastatic disease to the brain or other significant abnormality.

[Series 2: brain · axial · 0.47mm/px · z∈[+147,+268]mm · 10 of 40 slices shown, 13 images (1 of 2)]
[im 4/40  brain]
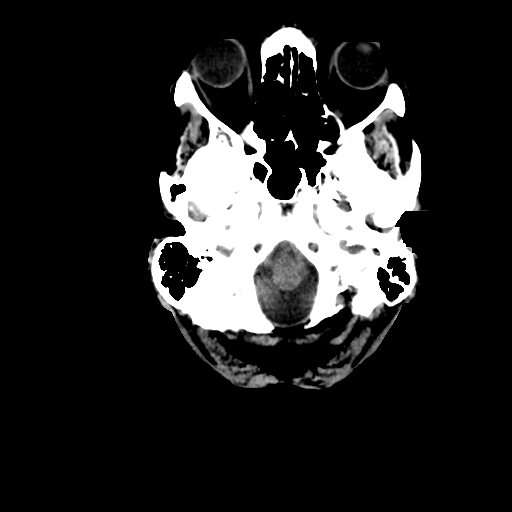
[im 4/40  bone]
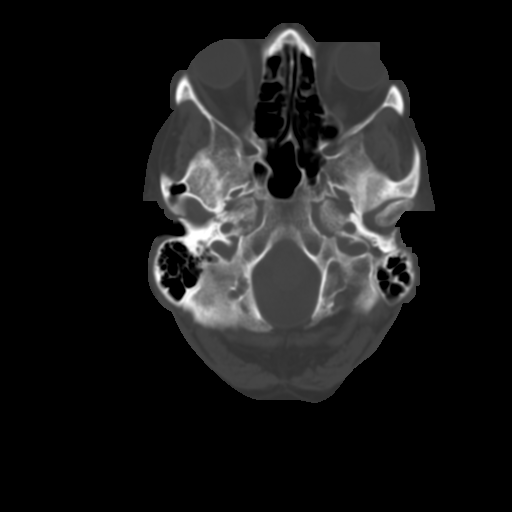
[im 8/40  brain]
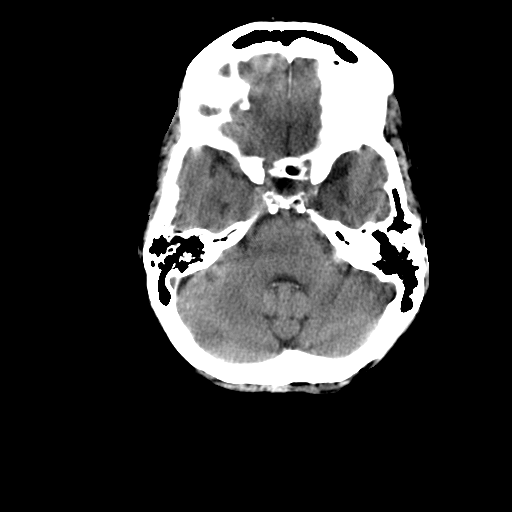
[im 11/40  brain]
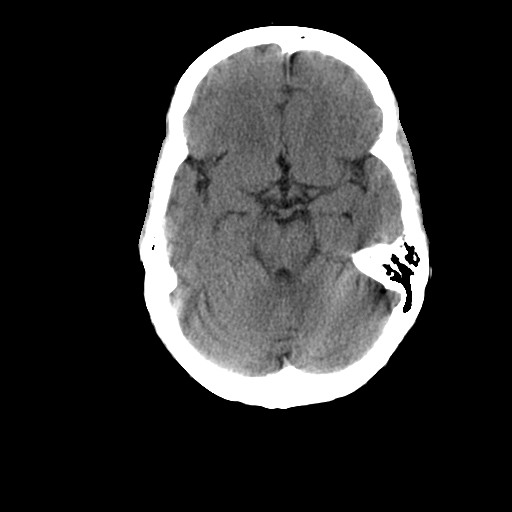
[im 15/40  brain]
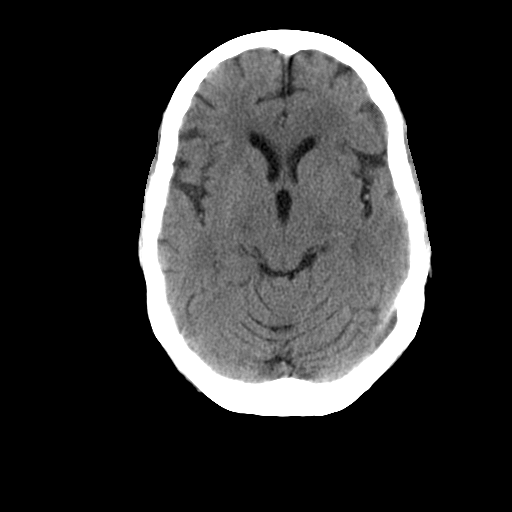
[im 18/40  brain]
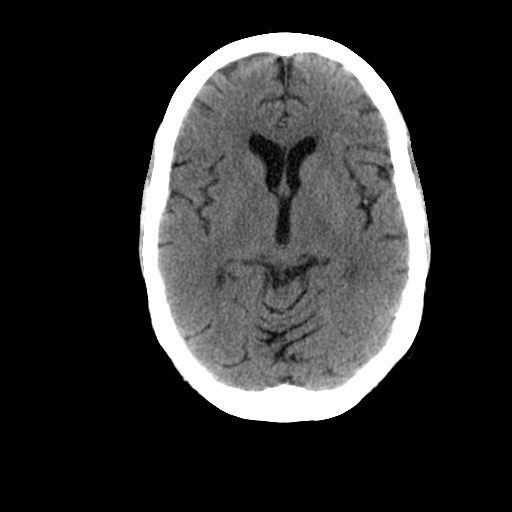
[im 18/40  bone]
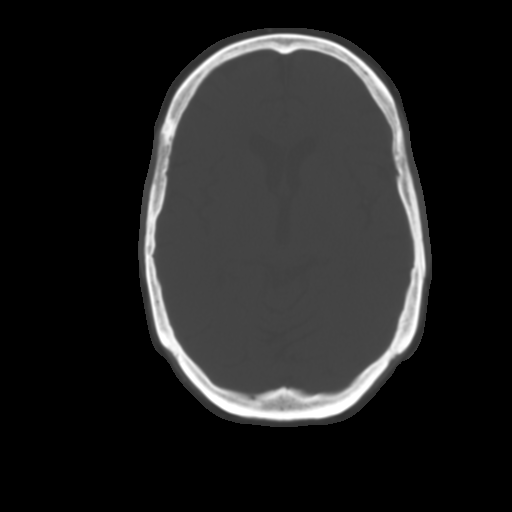
[im 22/40  brain]
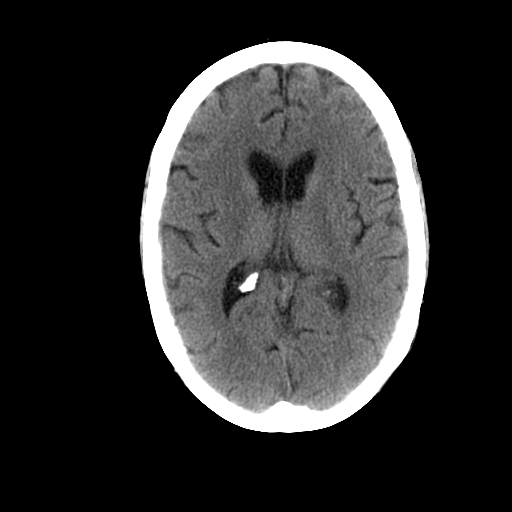
[im 25/40  brain]
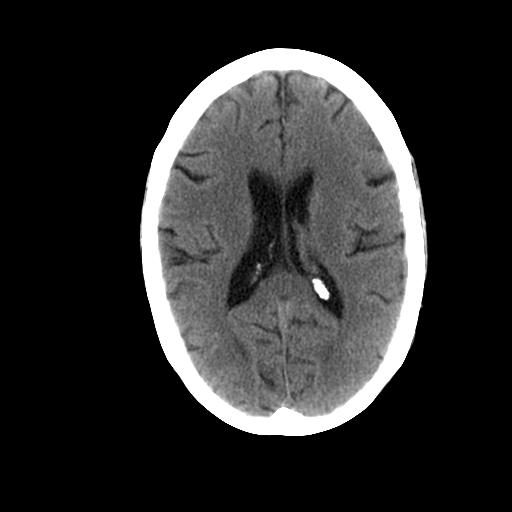
[im 29/40  brain]
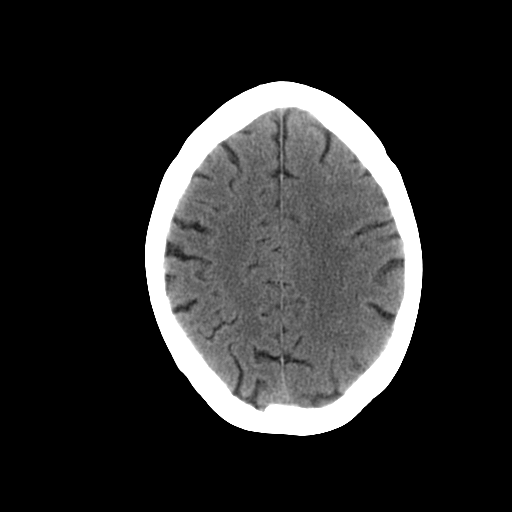
[im 32/40  brain]
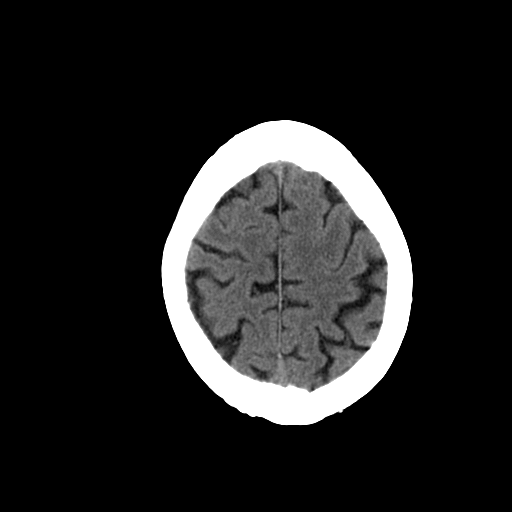
[im 32/40  bone]
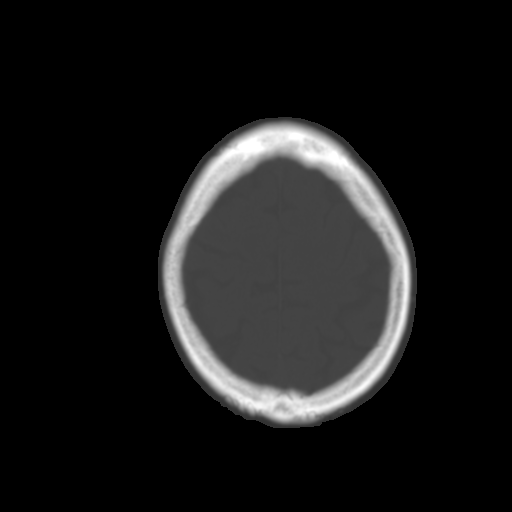
[im 36/40  brain]
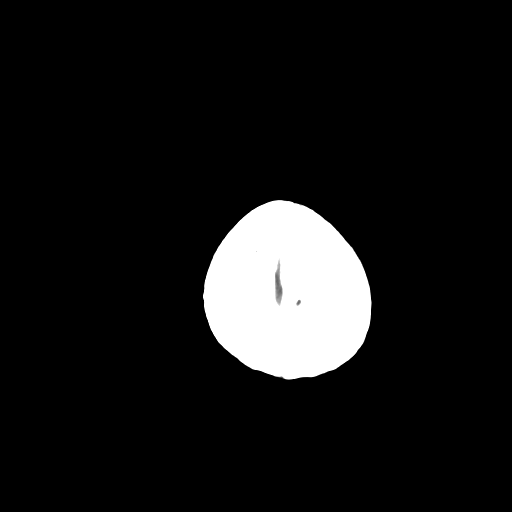

[Series 3: brain · axial · 0.47mm/px · z∈[+147,+268]mm · 7 of 32 slices shown (2 of 2)]
[im 4/32  brain]
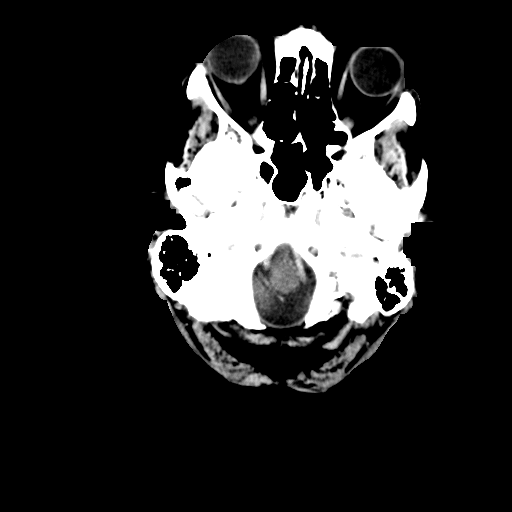
[im 8/32  brain]
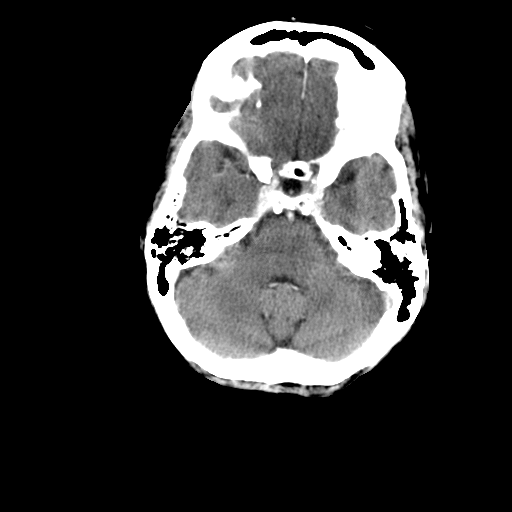
[im 12/32  brain]
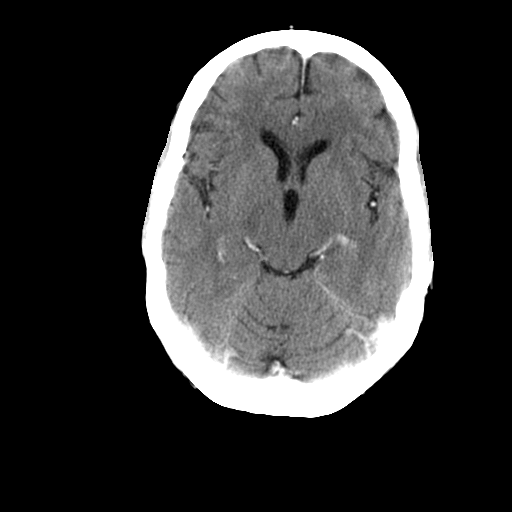
[im 16/32  brain]
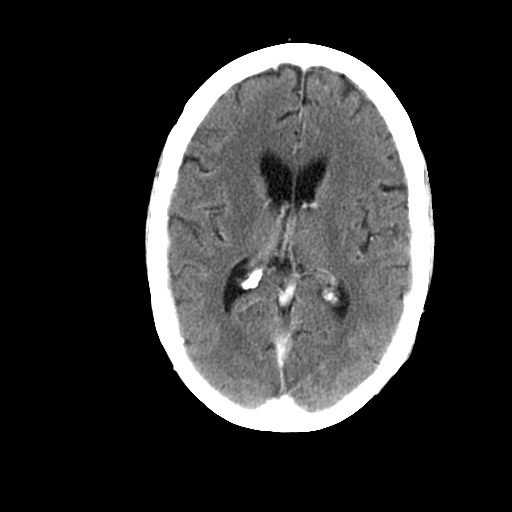
[im 20/32  brain]
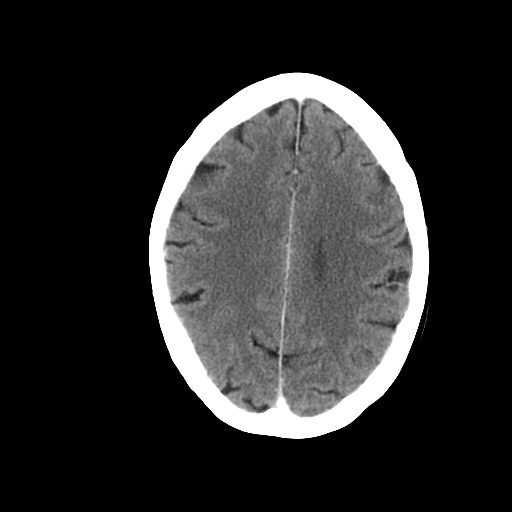
[im 24/32  brain]
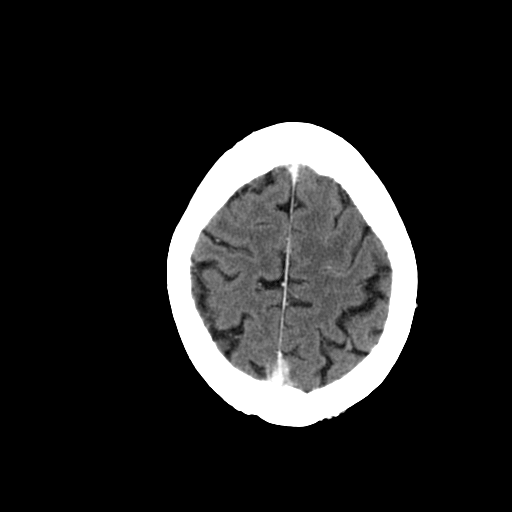
[im 28/32  brain]
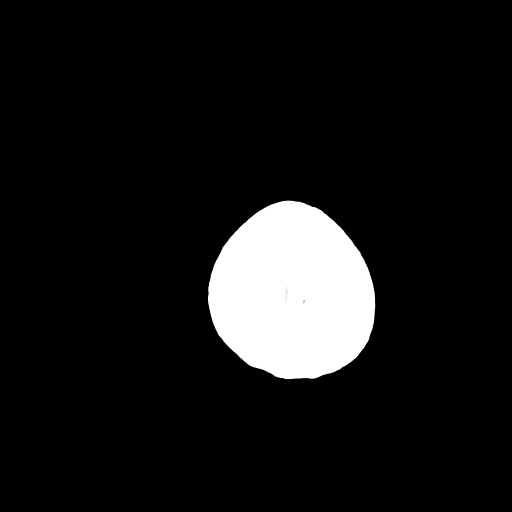

[17 of 30 positions shown; findings below may reference images not displayed]

## 2005-04-21 IMAGING — CR DG CHEST 2V
2 series · 2 of 2 positions shown · non-contrast
Comparison: none

CLINICAL DATA: Right upper lobe mass, status post lobectomy/wedge resection.
 TWO-VIEW CHEST, 09/27/03
 Comparing 09/26/03.

[view not recorded (1 of 2)]
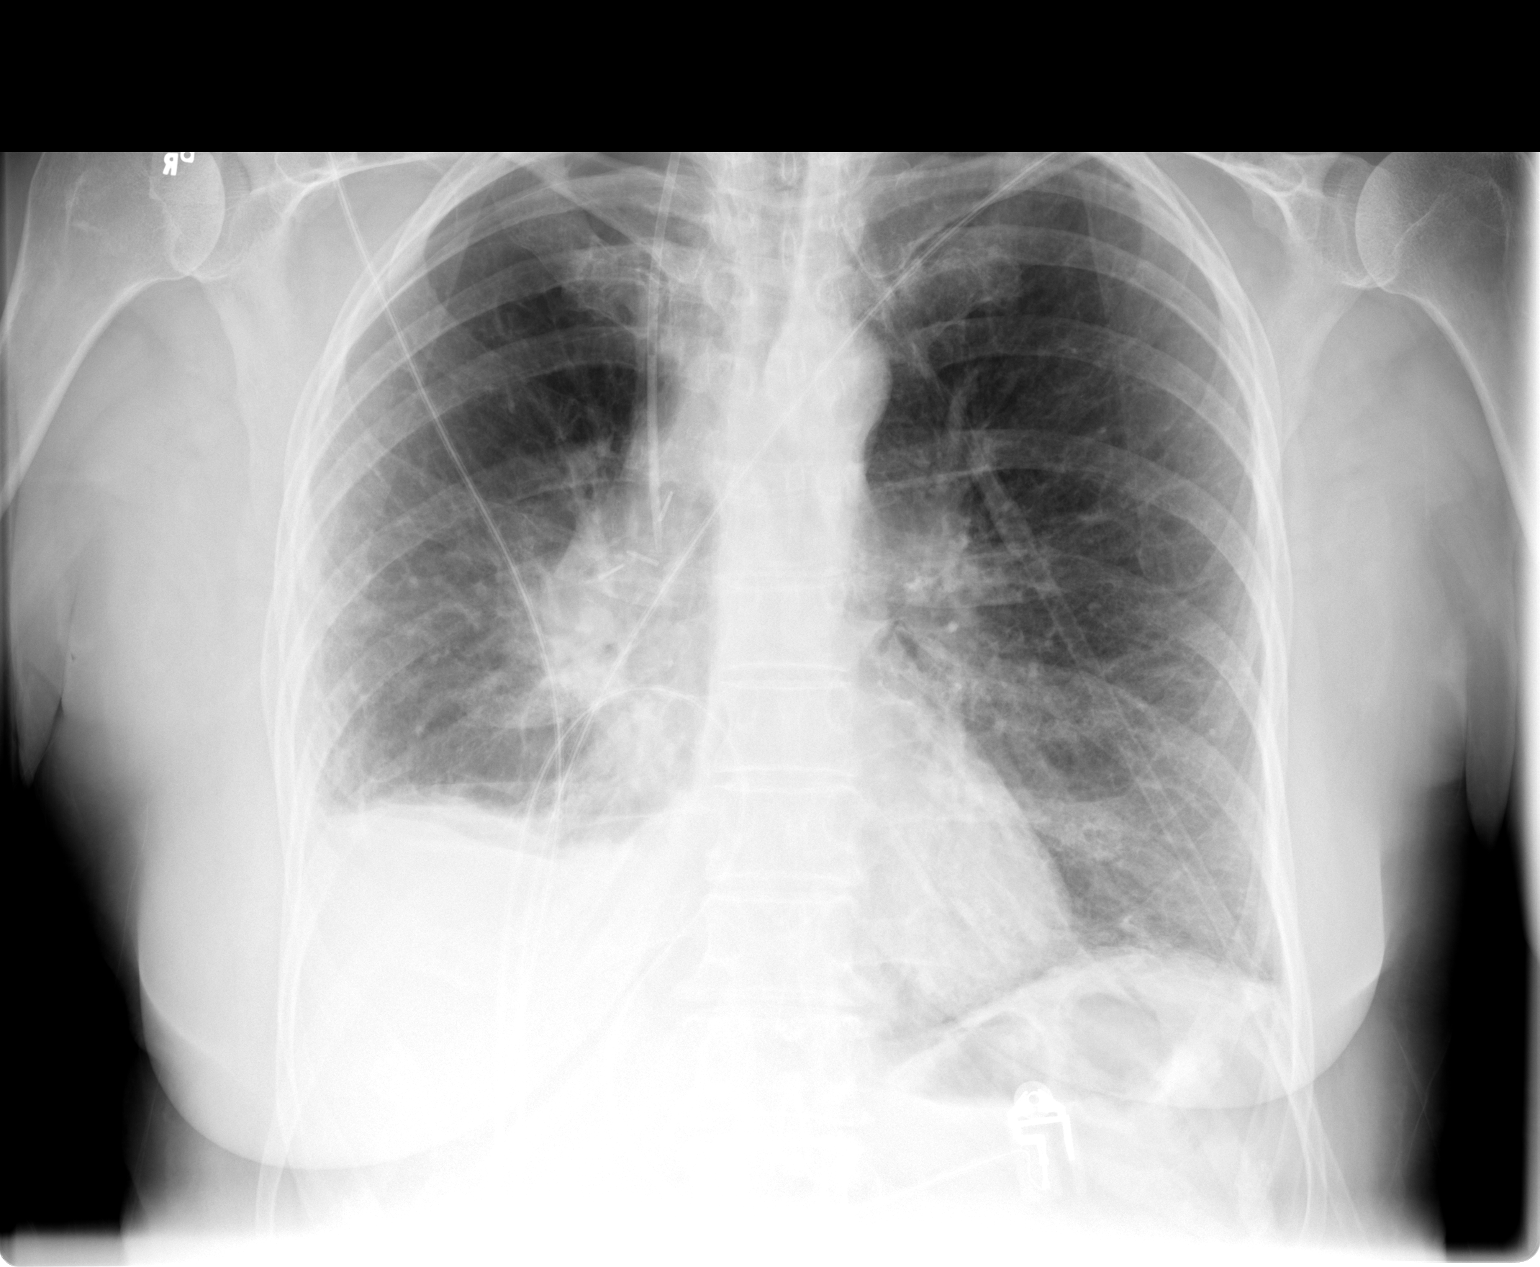

[view not recorded (2 of 2)]
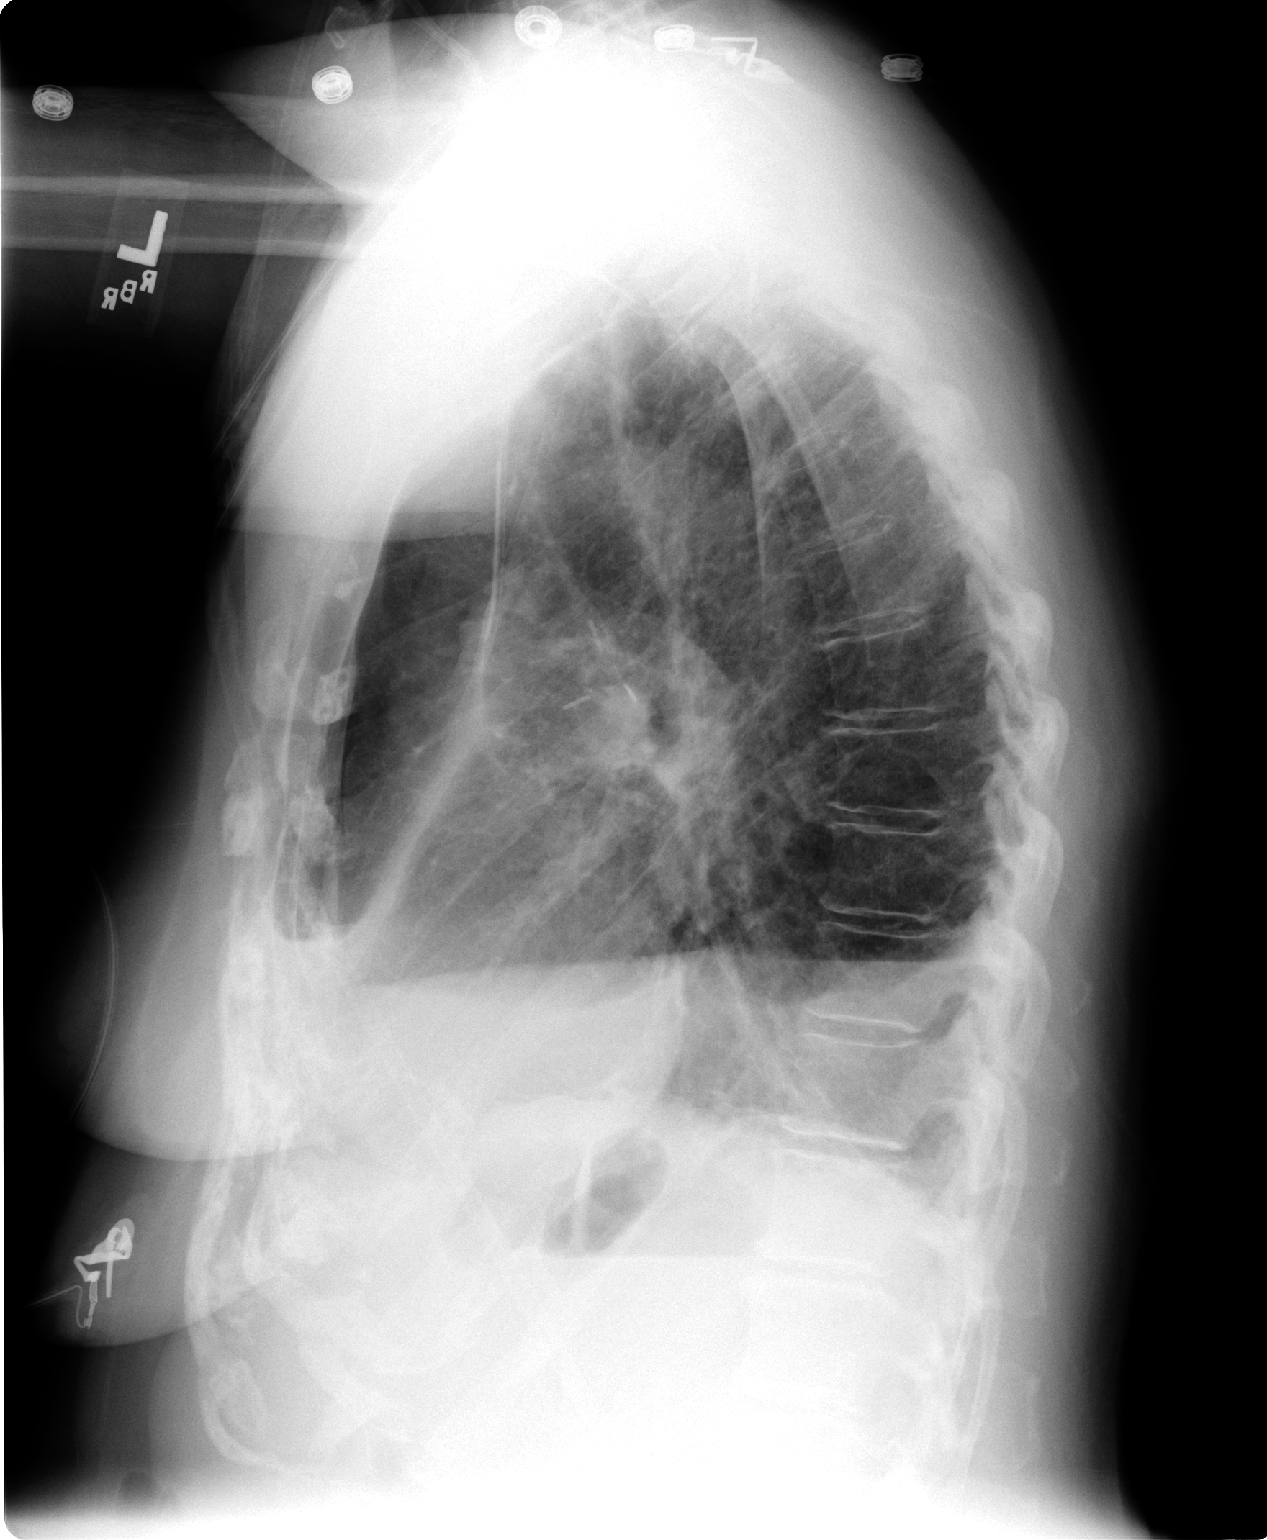

[2 of 2 positions shown; findings below may reference images not displayed]

FINDINGS: There is a right hydropneumothorax, with fluid in the right pleural space at the lung base, now assuming air-fluid levels and with the air component of the pneumothorax at about 10 percent.  The implication is that the previously loculated pneumothorax no longer appears loculated. 
 The right-sided central line remains in place.  Postoperative findings at the right hilum.  There is some right basilar atelectasis associated with the pleural effusion and also some subsegmental atelectasis at the left lung base.
 IMPRESSION 
 The dominant change is that the previously identified pneumothorax no longer appears loculated, and in association with the pleural effusion has formed some air-fluid levels at the right lung base.  Postoperative findings on the right.
 Mild interstitial prominence, stable.  
 Left basilar subsegmental atelectasis.

## 2005-04-27 IMAGING — CR DG CHEST 2V
2 series · 2 of 2 positions shown · non-contrast
Comparison: none

CLINICAL DATA: Lung lesion removal. 
 TWO VIEW CHEST: 
 Two views of the chest compared to prior films from 09/27/03.  There is loss of volume in the right lung with elevation of the right hemidiaphragm and a persistent hydropneumothorax on the right side.  The left lung remains relatively clear.

[view not recorded (1 of 2)]
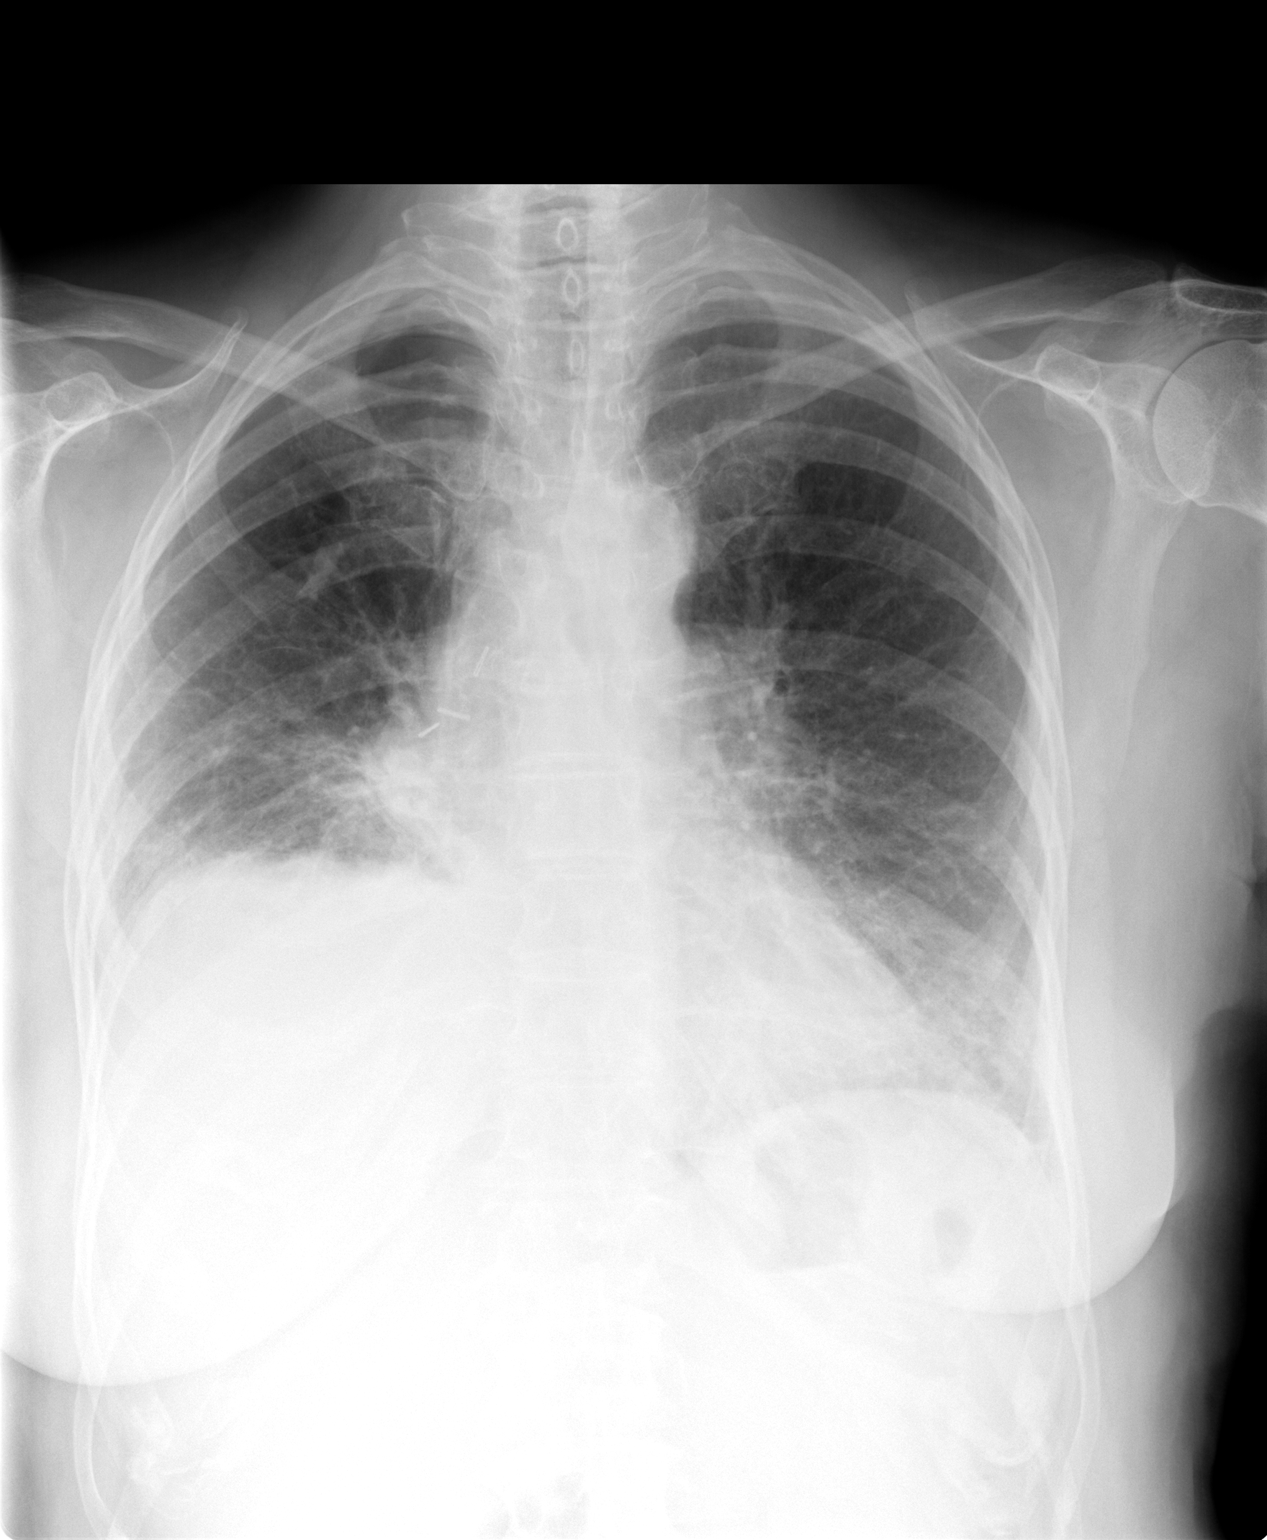

[view not recorded (2 of 2)]
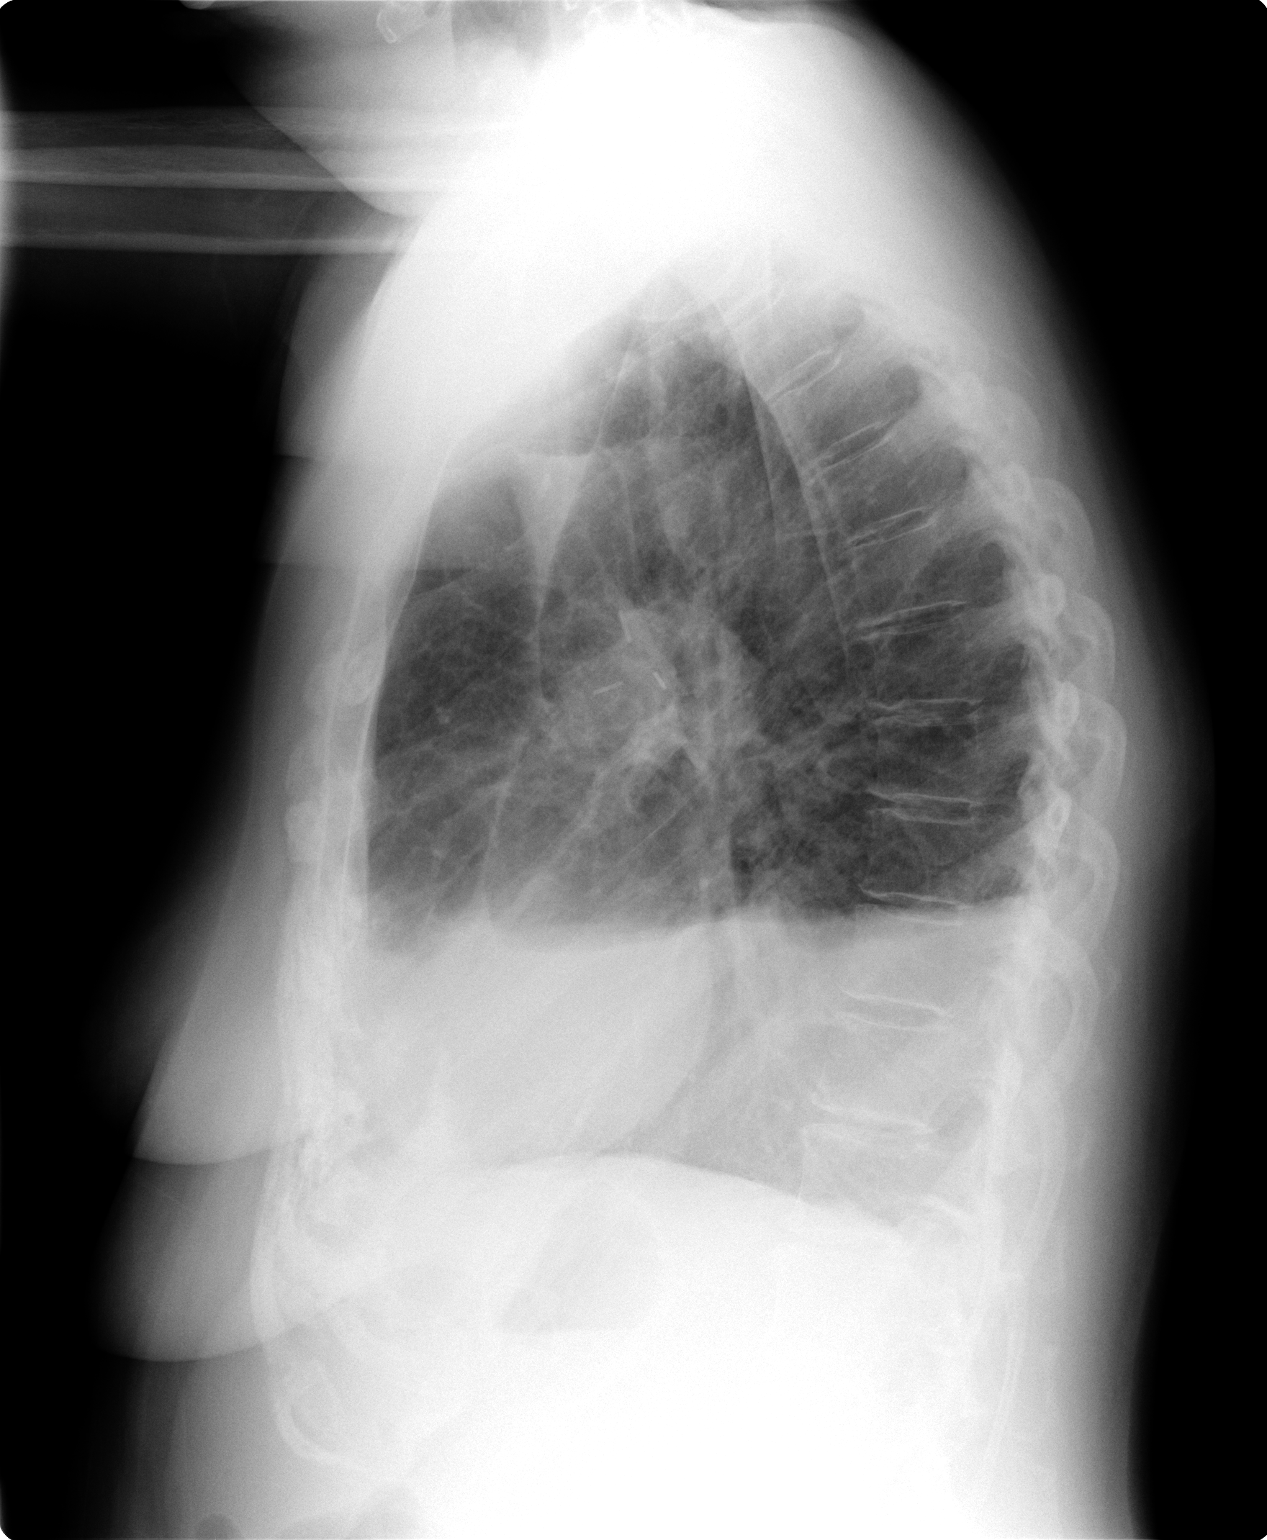

[2 of 2 positions shown; findings below may reference images not displayed]

IMPRESSION: 1.  Persistent complex right sided hydropneumothorax.  There is persistent loss of volume in the right lung with basilar atelectasis.  
 2.  Left lung remains relatively clear.

## 2005-05-17 IMAGING — CT CT CHEST W/ CM
1 of 2 series · 14 of 30 positions shown, 18 images · IV contrast (75 ML OMNI 300)
Comparison: none

CLINICAL DATA: Evaluate right pleural effusion.
CT CHEST WITH CONTRAST
Multidetector helical scans through the chest were performed after IV contrast media was given.  75 cc of Omnipaque 300 were given as the contrast media.  This study is compared to a prior CT of 08/22/03. 
In the interval, the nodule in the right upper lung field has been resected.  There is now a moderate to large right pleural effusion present with some of it possibly loculated anteromedially in the right lower hemithorax.  Also, the previously noted subcarinal nodes appear to have increased in the interval.  On image #27, the subcarinal node measures 16 x 20 mm compared to 9 mm previously.  Also, superior mediastinal paratracheal nodes appear larger.  One of the largest nodes lies between the right common carotid artery and right subclavian artery on image #17 measuring 7 x 16 mm.  Also, right paratracheal nodes higher in the right chest appear larger with a node on image #9 measuring 15 x 10 mm and not being discernible on the prior study.  On lung window images, no new lung lesion is seen.  There is some volume loss on the right as a result of the moderate to large right pleural effusion.  The portion of the upper abdomen that is visualized appears normal. 
IMPRESSION
1.  Moderate to large right pleural effusion which may be partially loculated anteromedially at the right lung base.  
2.  Definite increase in size of mediastinal nodes, as described above.  These could be hyperplastic, but neoplastic involvement is a definite consideration in view of the patient's history.

[Series 2: — · axial · 0.70mm/px · z∈[-255,+25]mm · 14 of 66 slices shown, 18 images]
[im 5/66  mediastinal]
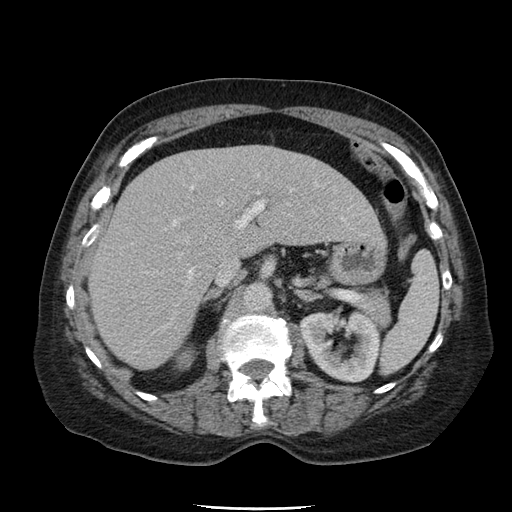
[im 5/66  lung]
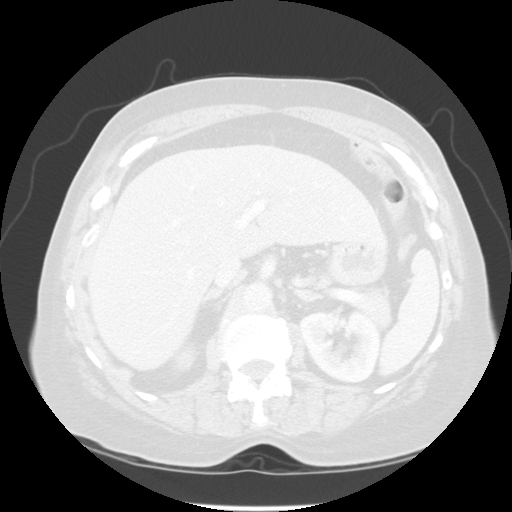
[im 10/66  lung]
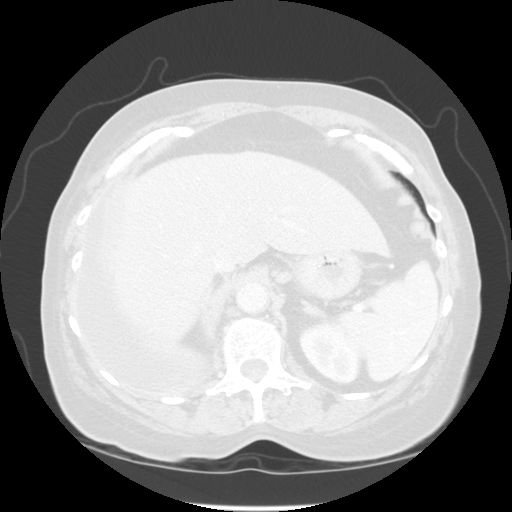
[im 14/66  lung]
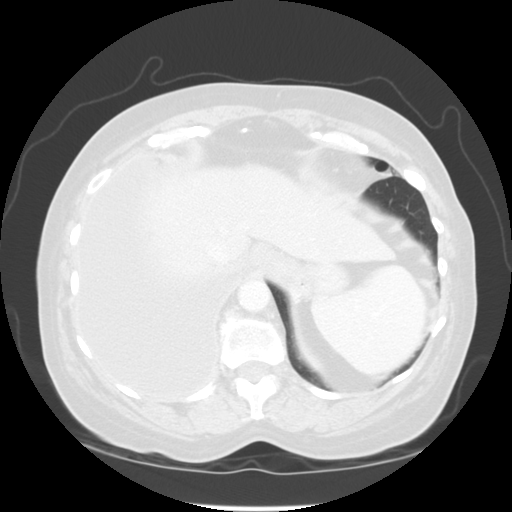
[im 19/66  lung]
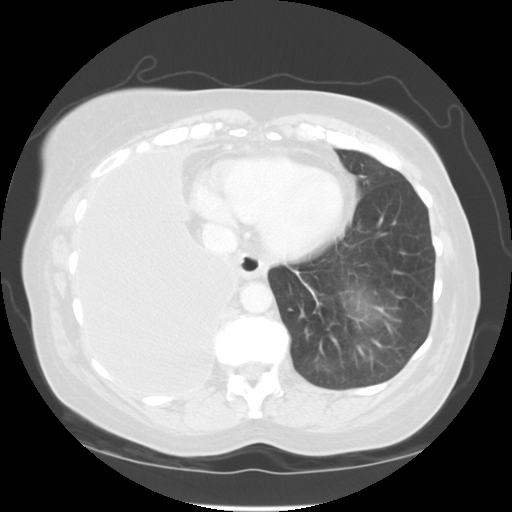
[im 24/66  mediastinal]
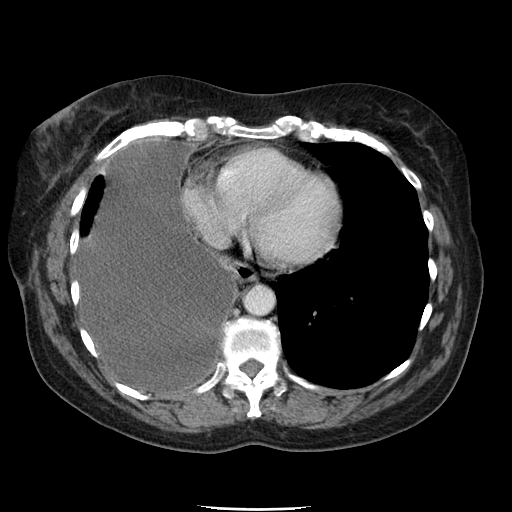
[im 24/66  lung]
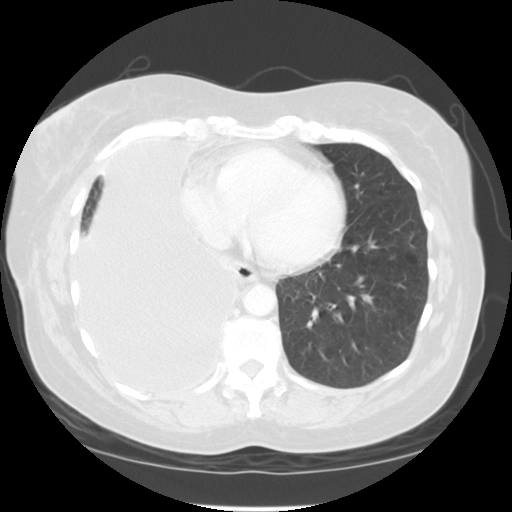
[im 28/66  lung]
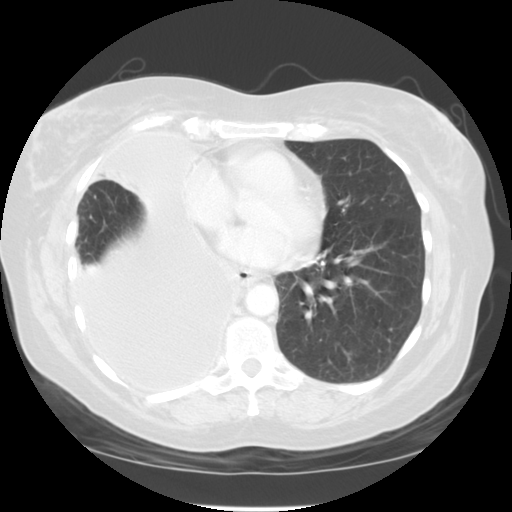
[im 32/66  lung]
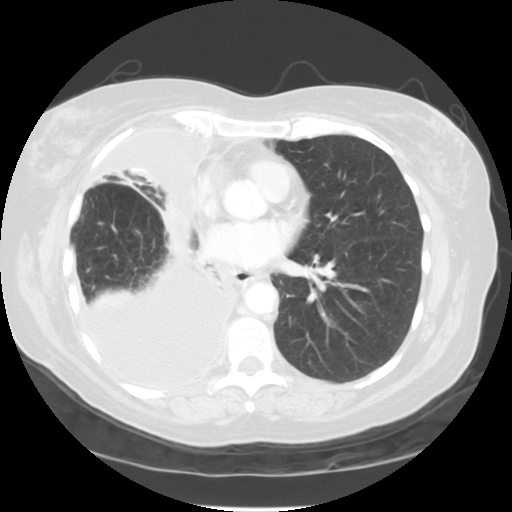
[im 33/66  lung]
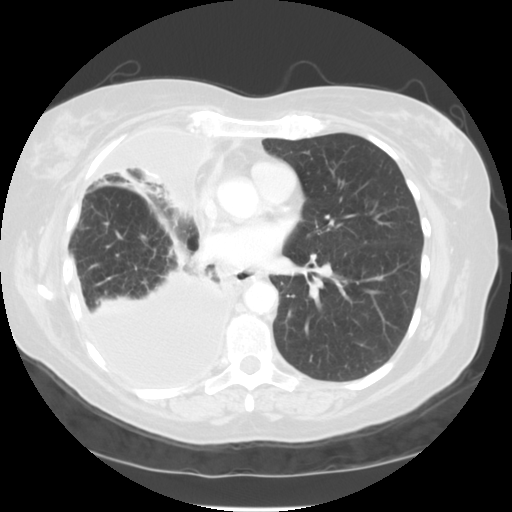
[im 38/66  mediastinal]
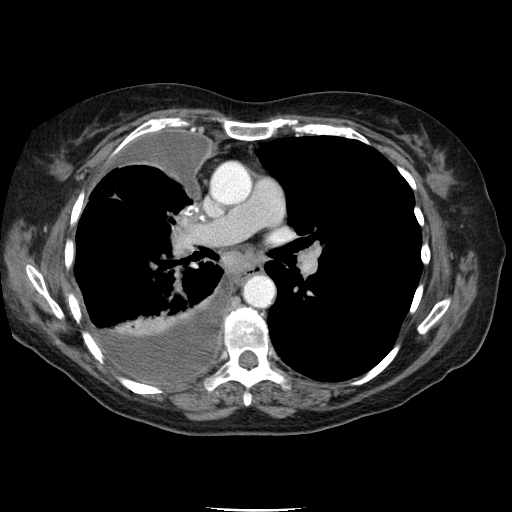
[im 38/66  lung]
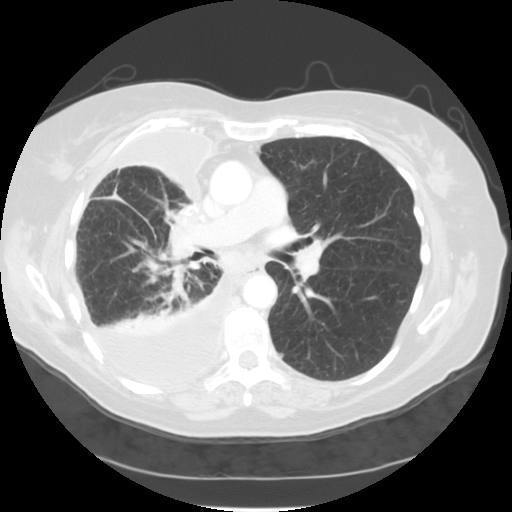
[im 42/66  lung]
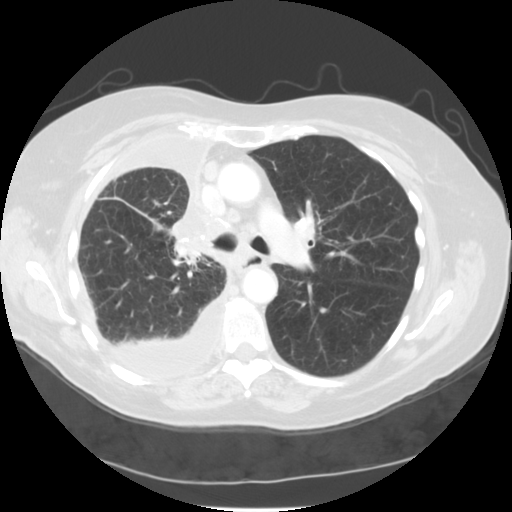
[im 47/66  lung]
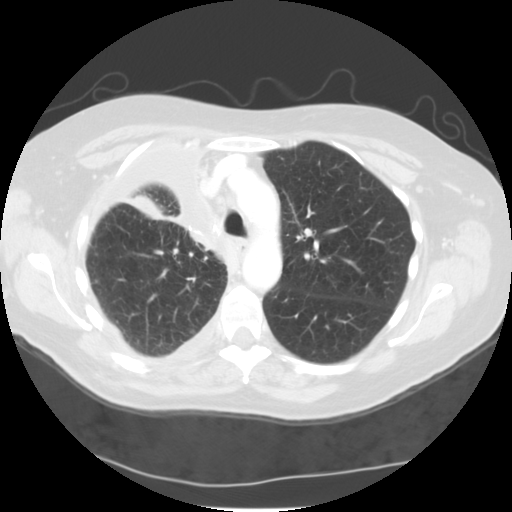
[im 52/66  lung]
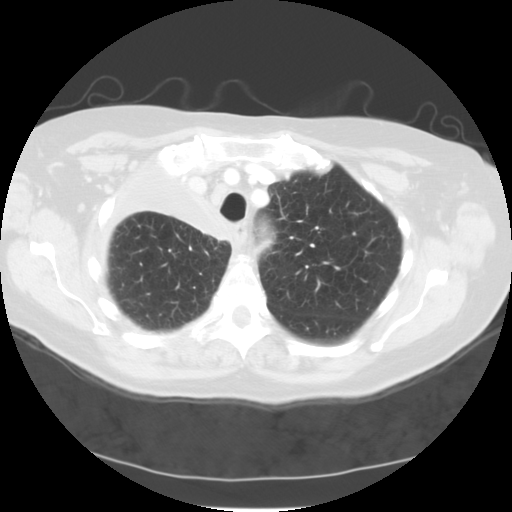
[im 56/66  mediastinal]
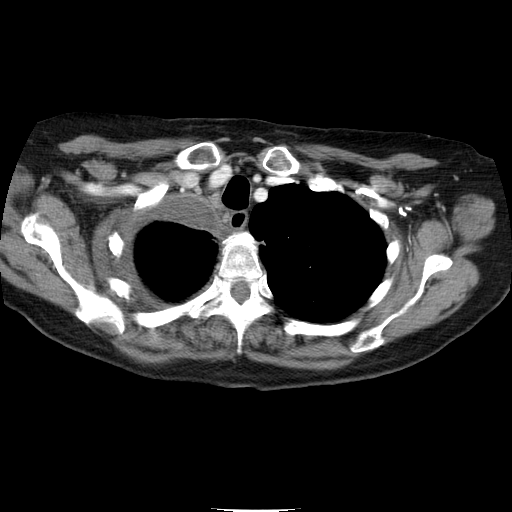
[im 56/66  lung]
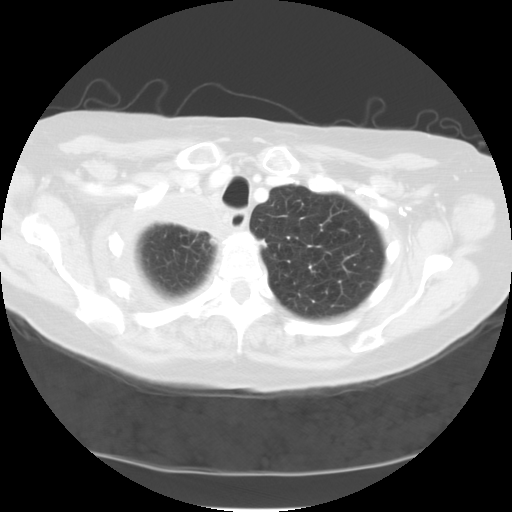
[im 61/66  lung]
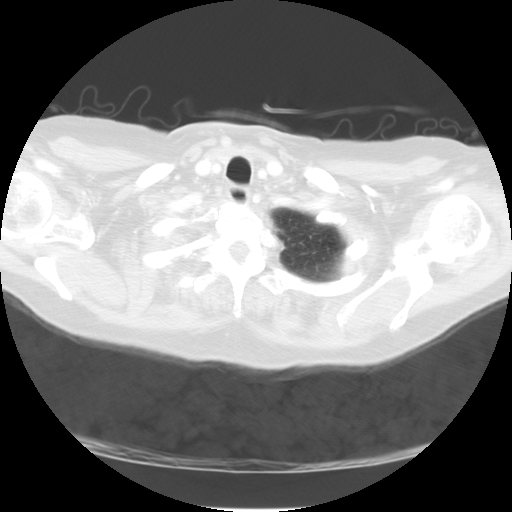

[14 of 30 positions shown; findings below may reference images not displayed]

## 2005-05-20 IMAGING — CR DG CHEST 2V
2 series · 2 of 2 positions shown · non-contrast
Comparison: none

CLINICAL DATA: Pleural effusion.  Chest pain. 
 CHEST 2 VIEWS 
 Comparison to a CT of 10/23/03 and a chest x-ray of 10/02/03.
 The left chest remains clear, showing only some scarring but no focal pulmonary pathology or pleural fluid.  On the right, the patient has had lobectomy.  There continues to be a large amount of pleural fluid surrounding the remaining lung.  There is more pleural fluid than was seen on 10/03/03.  I don?t think there has likely been much change since the CT scan of three days ago.  
 IMPRESSION
 1.  Persistent pleural fluid in the right chest.

[view not recorded (1 of 2)]
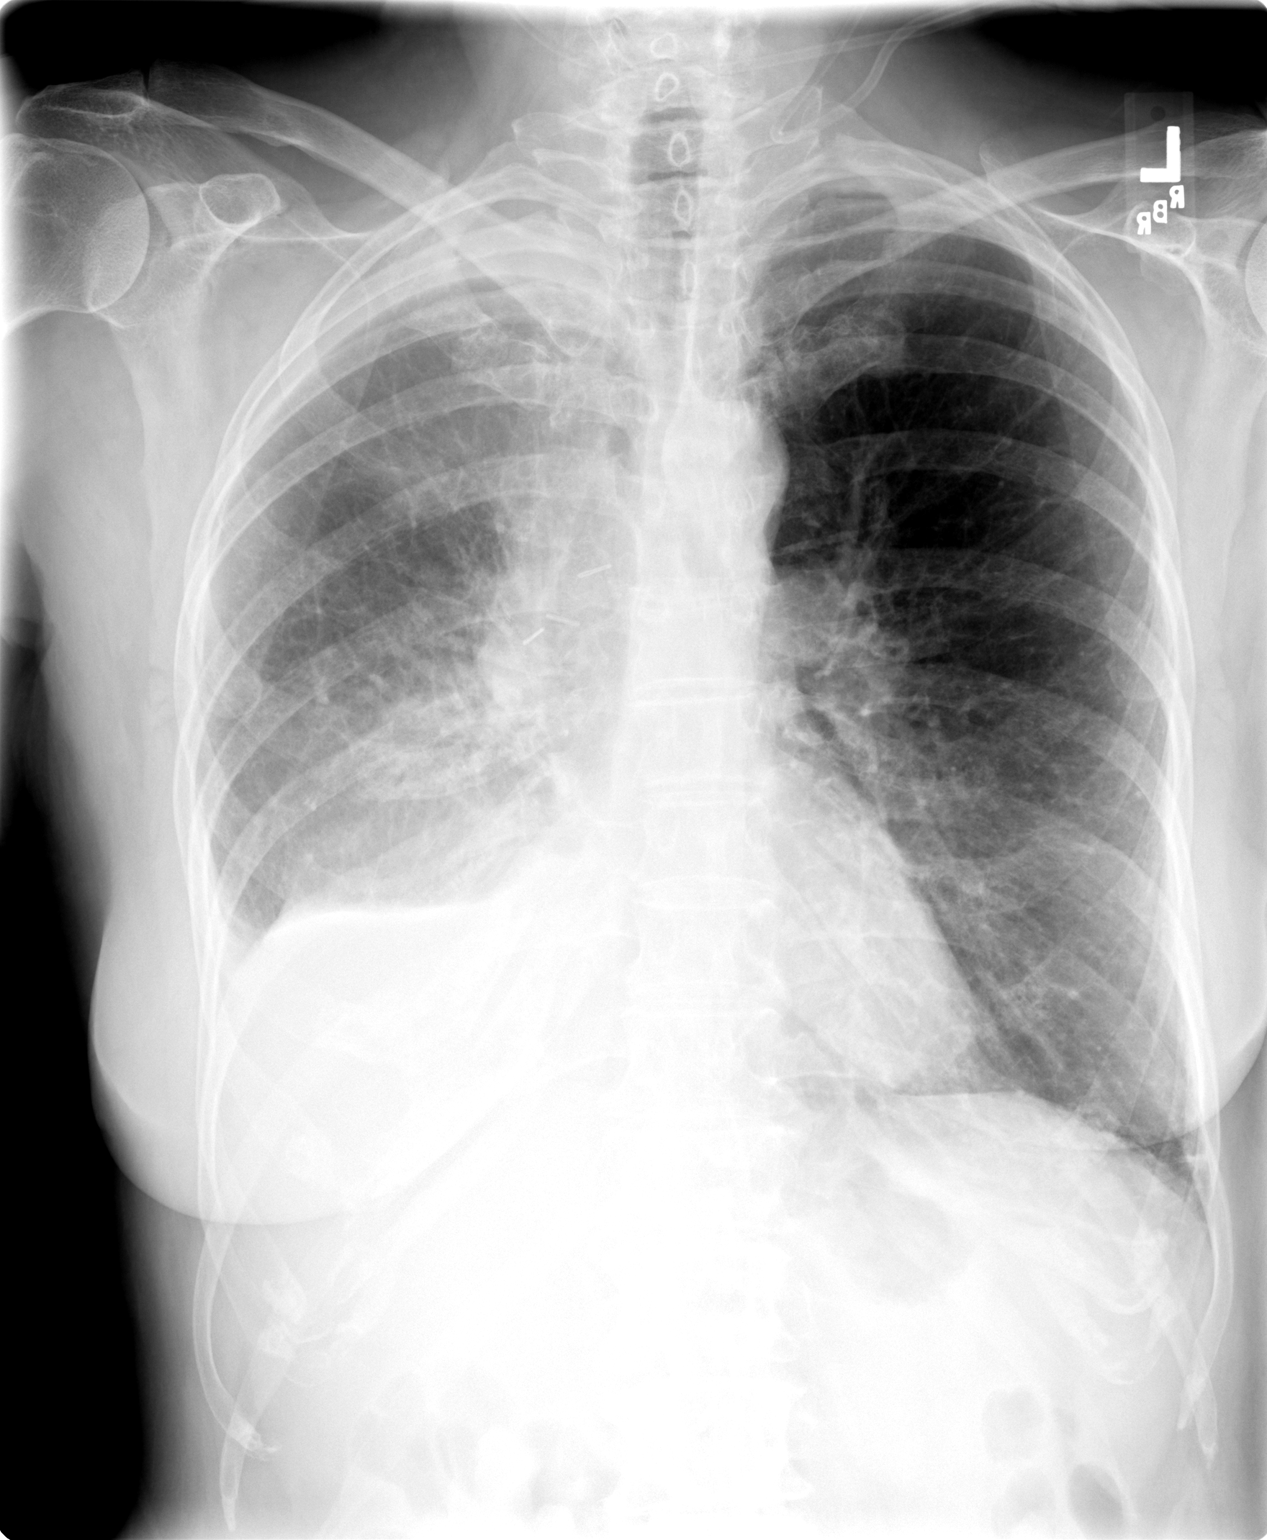

[view not recorded (2 of 2)]
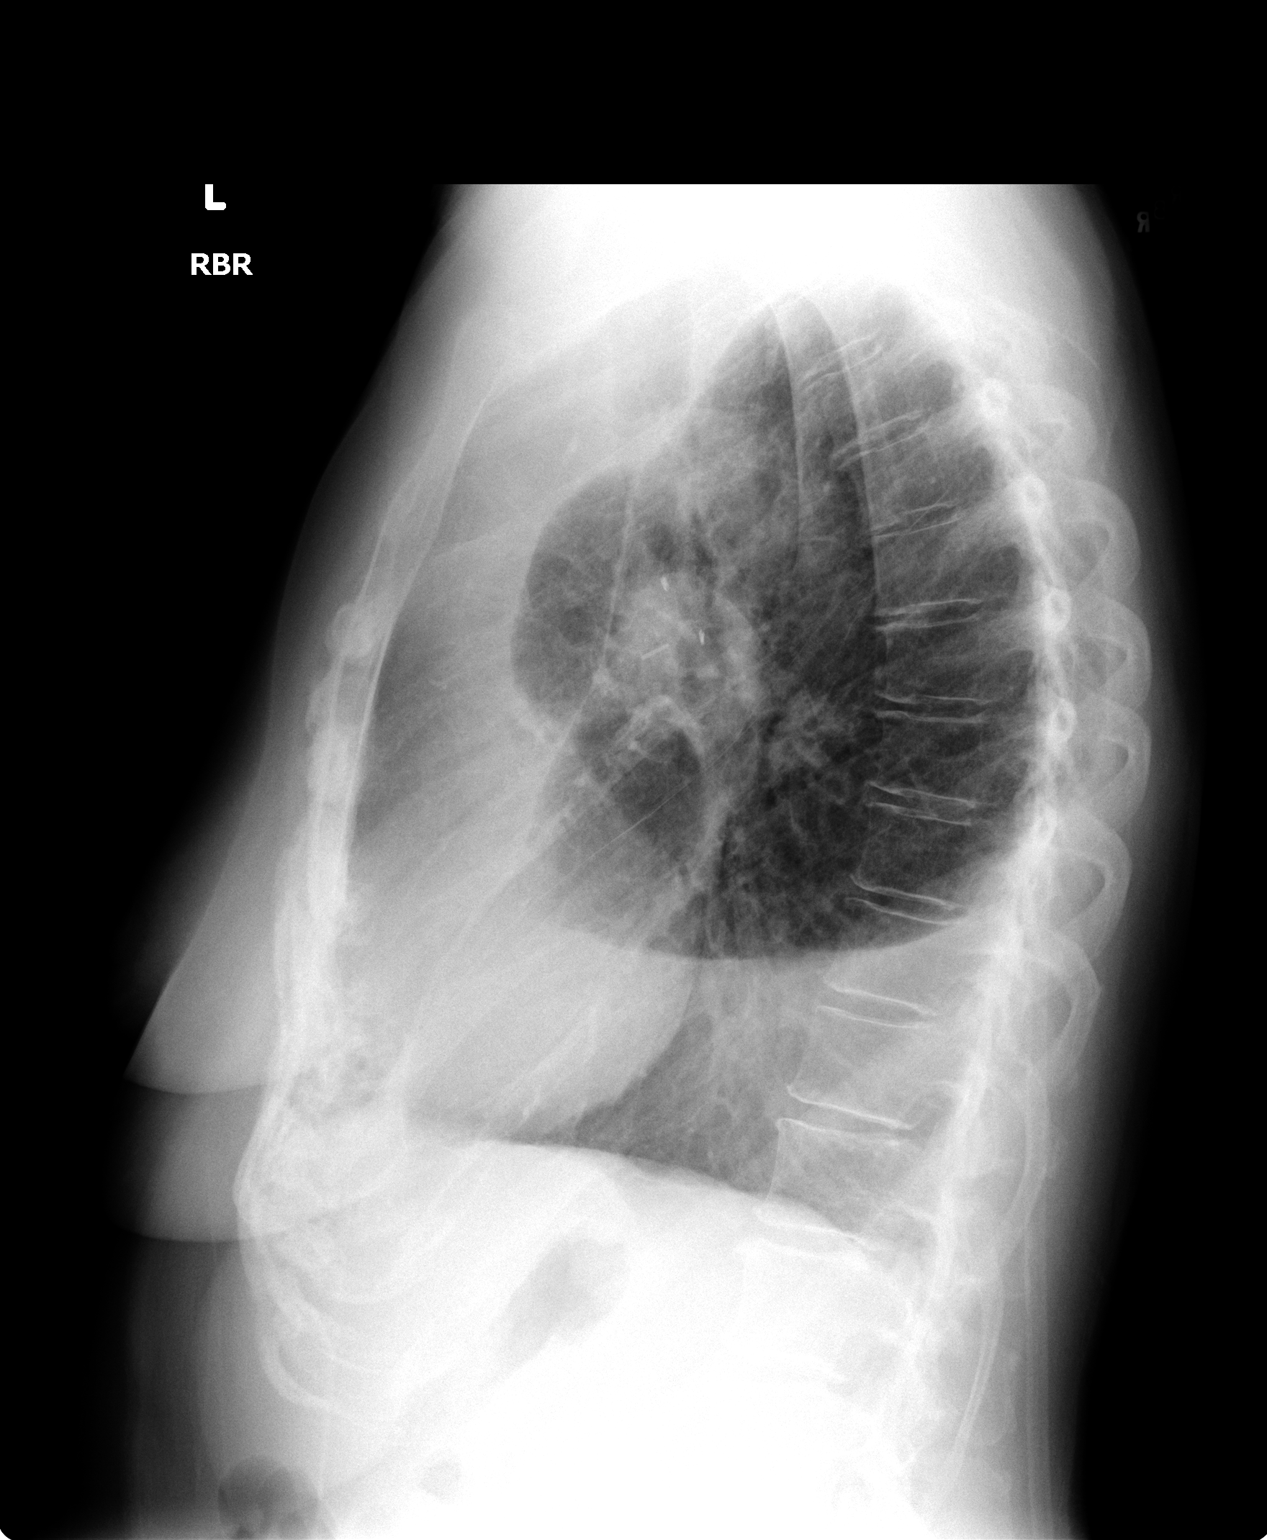

[2 of 2 positions shown; findings below may reference images not displayed]

## 2005-05-22 IMAGING — CR DG CHEST 2V
2 series · 2 of 2 positions shown · non-contrast
Comparison: 10/26/03.

CLINICAL DATA: follow-up pleural effusion
 TWO VIEW CHEST

[view not recorded (1 of 2)]
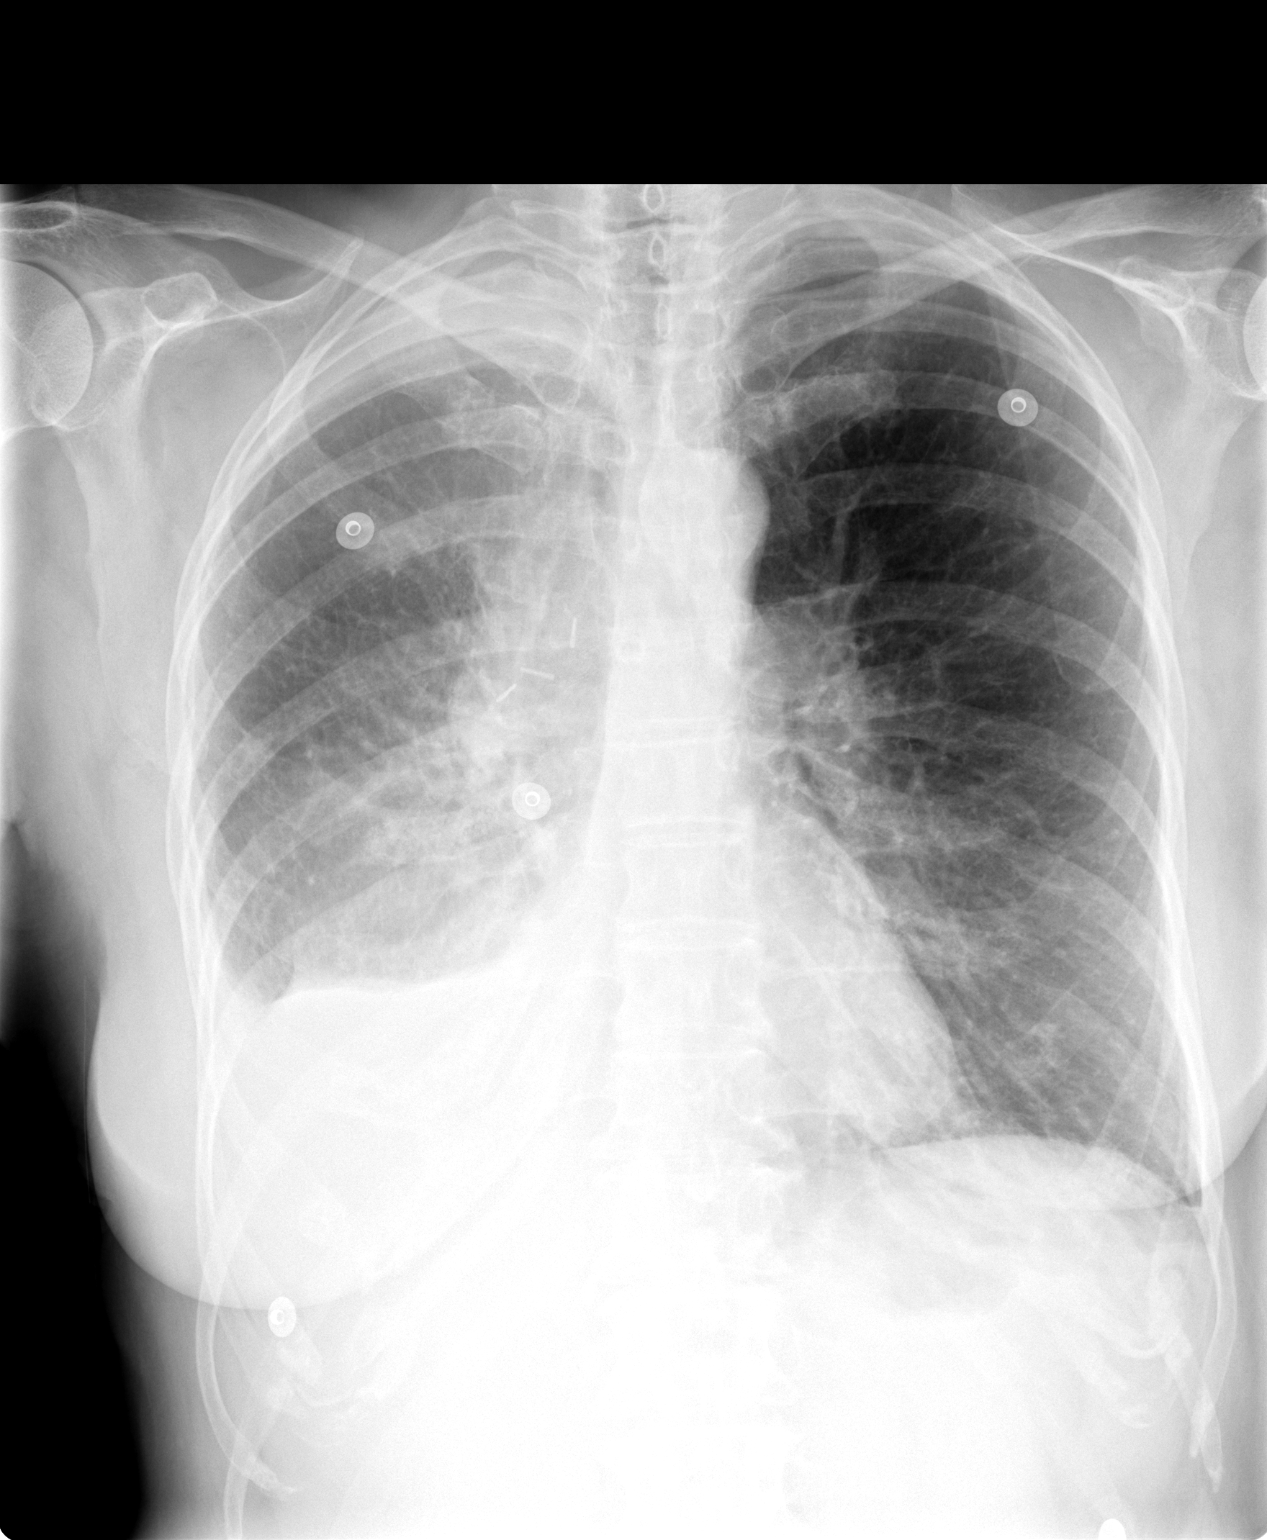

[view not recorded (2 of 2)]
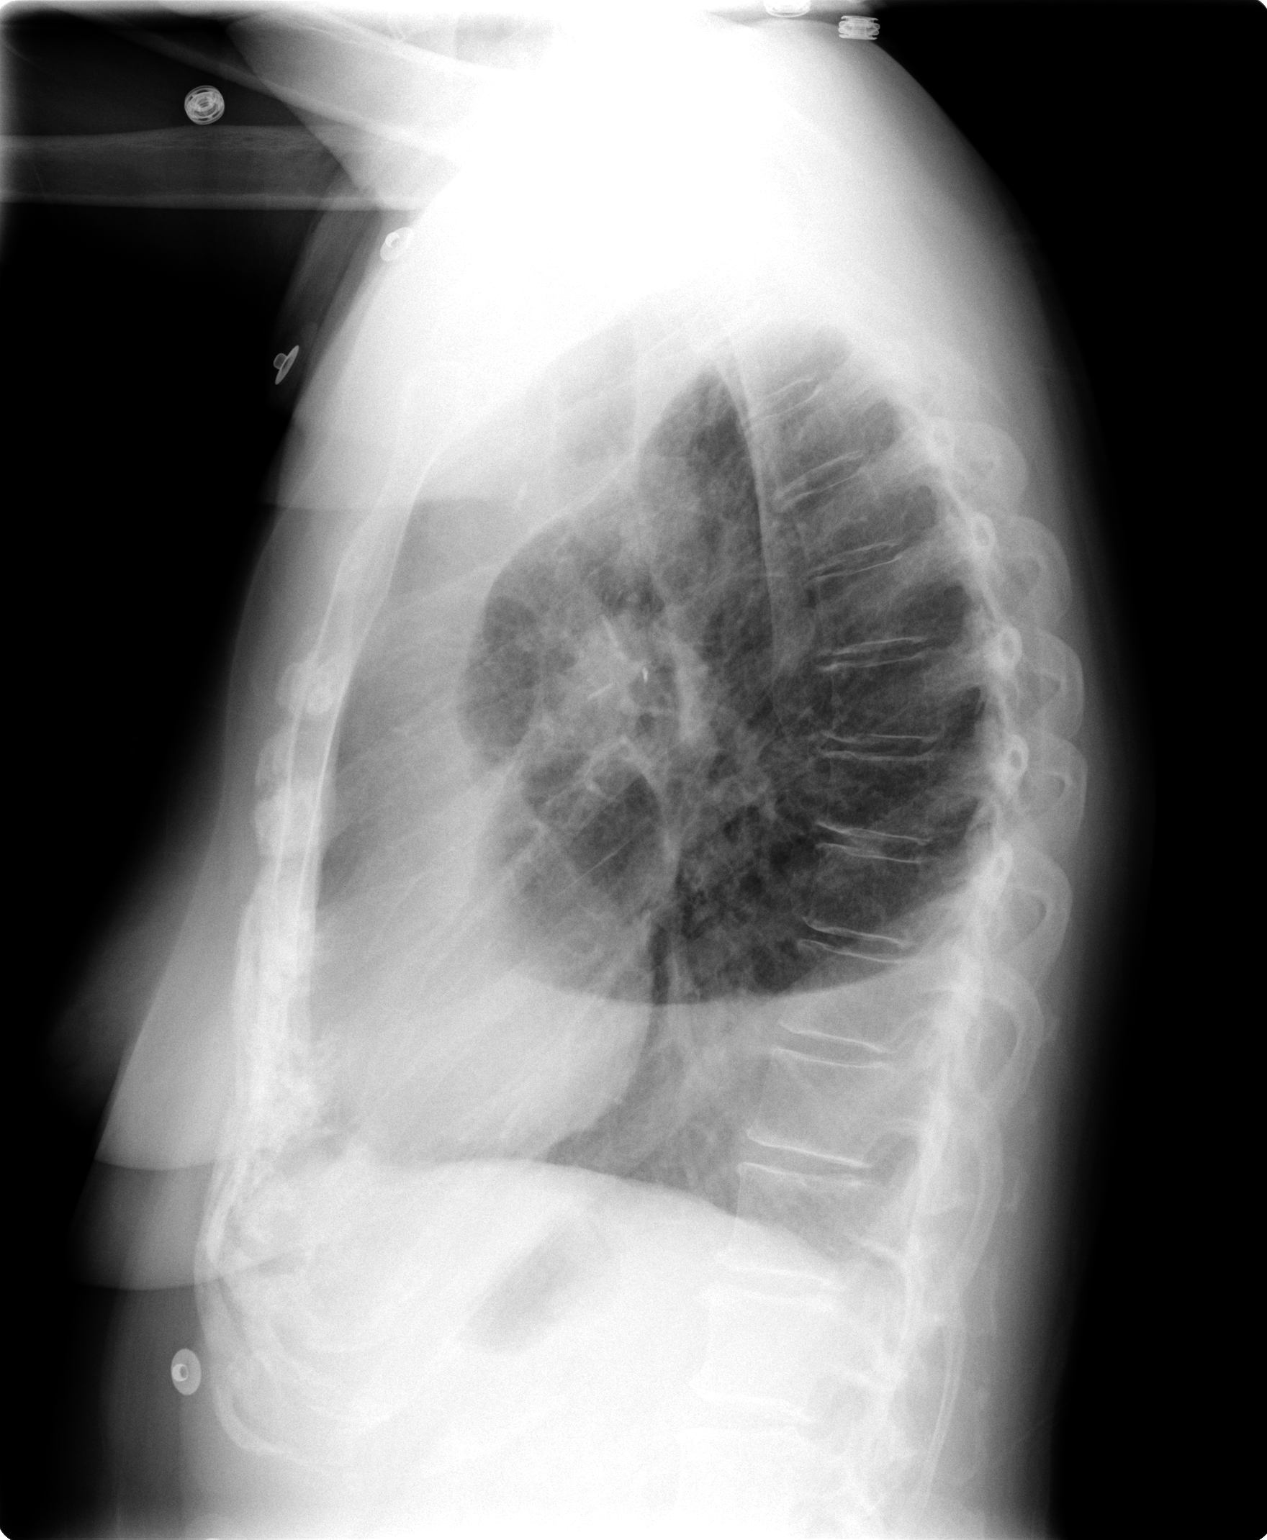

[2 of 2 positions shown; findings below may reference images not displayed]

There is a persistent right pleural effusion which is not significantly changed.  There are right basilar atelectatic changes stable. The left lung is clear. Previous right lung surgery.
 IMPRESSION
 No significant change in aeration with right pleural effusion again noted.

## 2005-05-31 IMAGING — CR DG CHEST 2V
2 series · 2 of 2 positions shown · non-contrast
Comparison: none

CLINICAL DATA: Lung lesion. Status-post right lobectomy for lung cancer six weeks ago. 
TWO VIEW CHEST
Comparing chest CT 10/23/03 and prior chest x-ray of 10/28/03.

[view not recorded (1 of 2)]
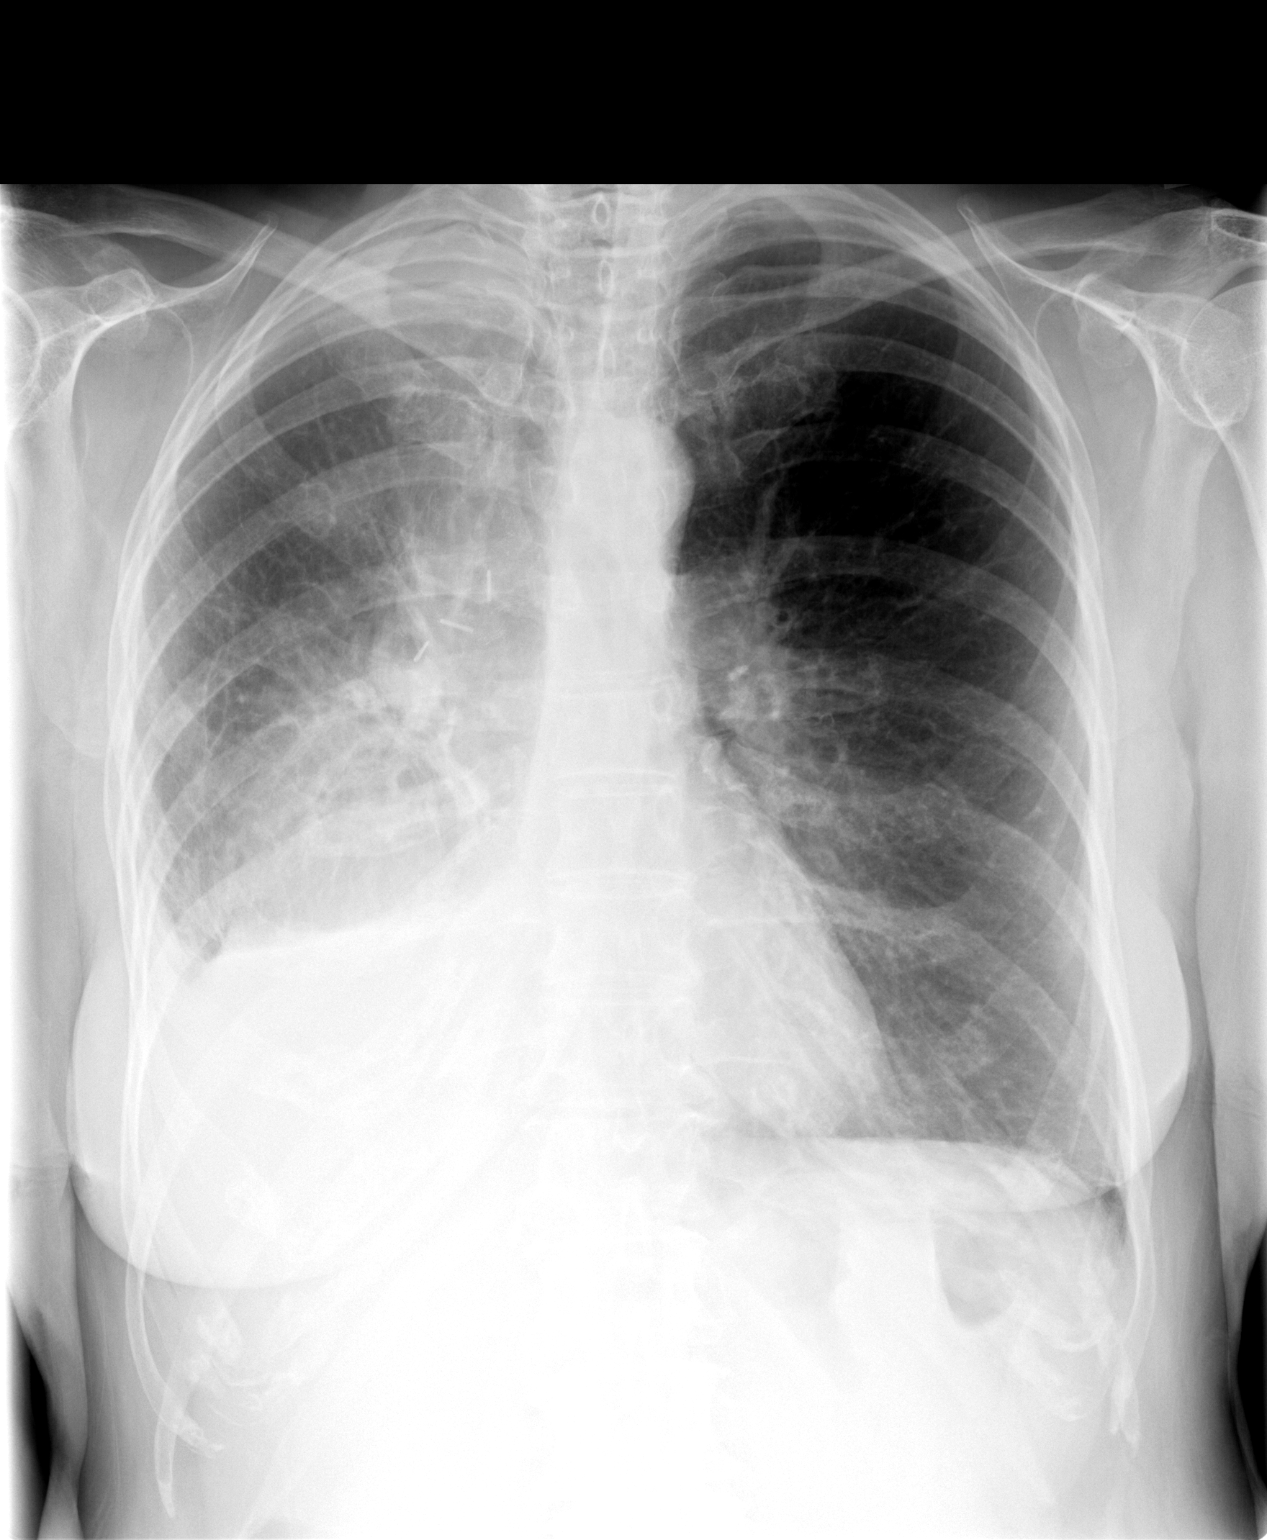

[view not recorded (2 of 2)]
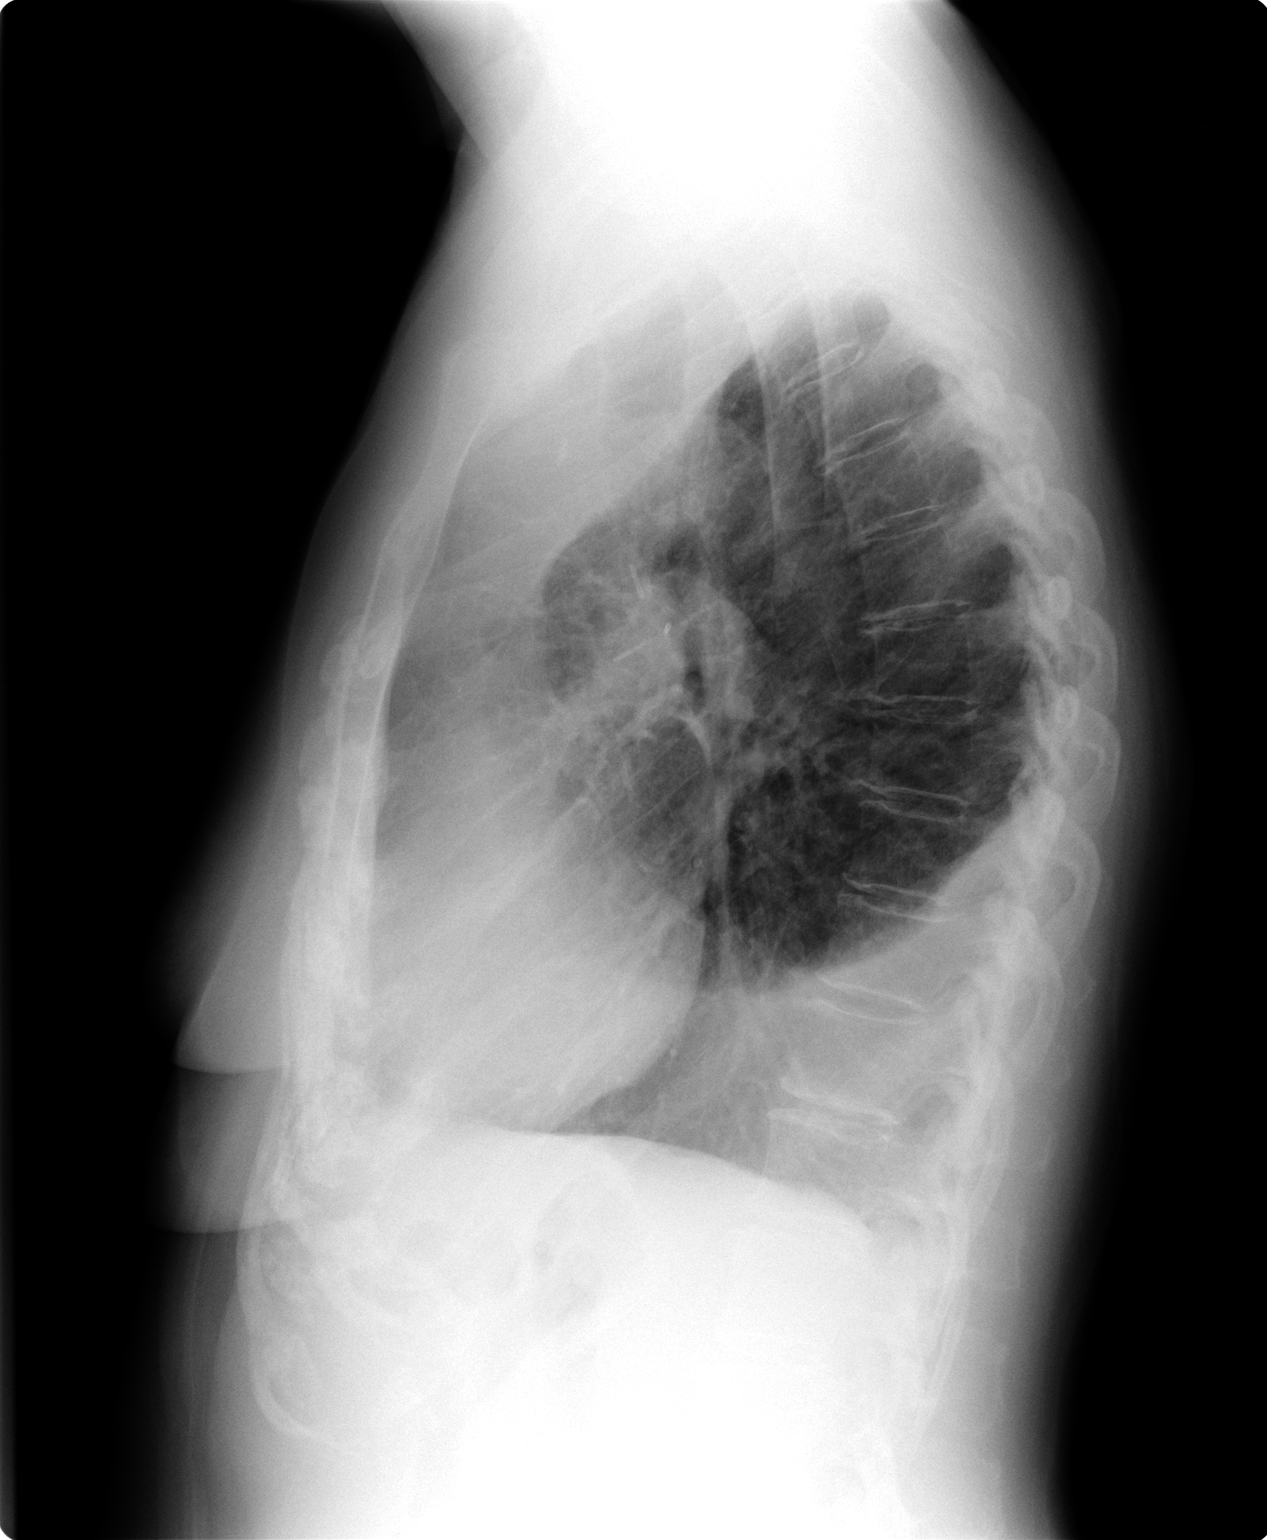

[2 of 2 positions shown; findings below may reference images not displayed]

FINDINGS: Right lobectomy noted.  Stable right pleural effusion.  Some of this is likely loculated anteriorly.  There is associated passive atelectasis.
IMPRESSION
1.  Status-post right lobectomy with prominent right pleural effusion which is stable.

## 2005-06-07 IMAGING — CR DG CHEST 2V
2 series · 2 of 2 positions shown · non-contrast
Comparison: none

CLINICAL DATA: Lung lesion, status post surgery eight weeks ago.
 CHEST, TWO VIEWS
 Comparison 11/06/03. 
 Postoperative changes are noted on the right lung.  Elevation of the right hemidiaphragm and moderate right effusion again noted. No change since prior study.  Left lung remains clear.   Heart is normal size. 
 IMPRESSION
 Stable postoperative changes on the right.  Stable moderate right effusion.

[view not recorded (1 of 2)]
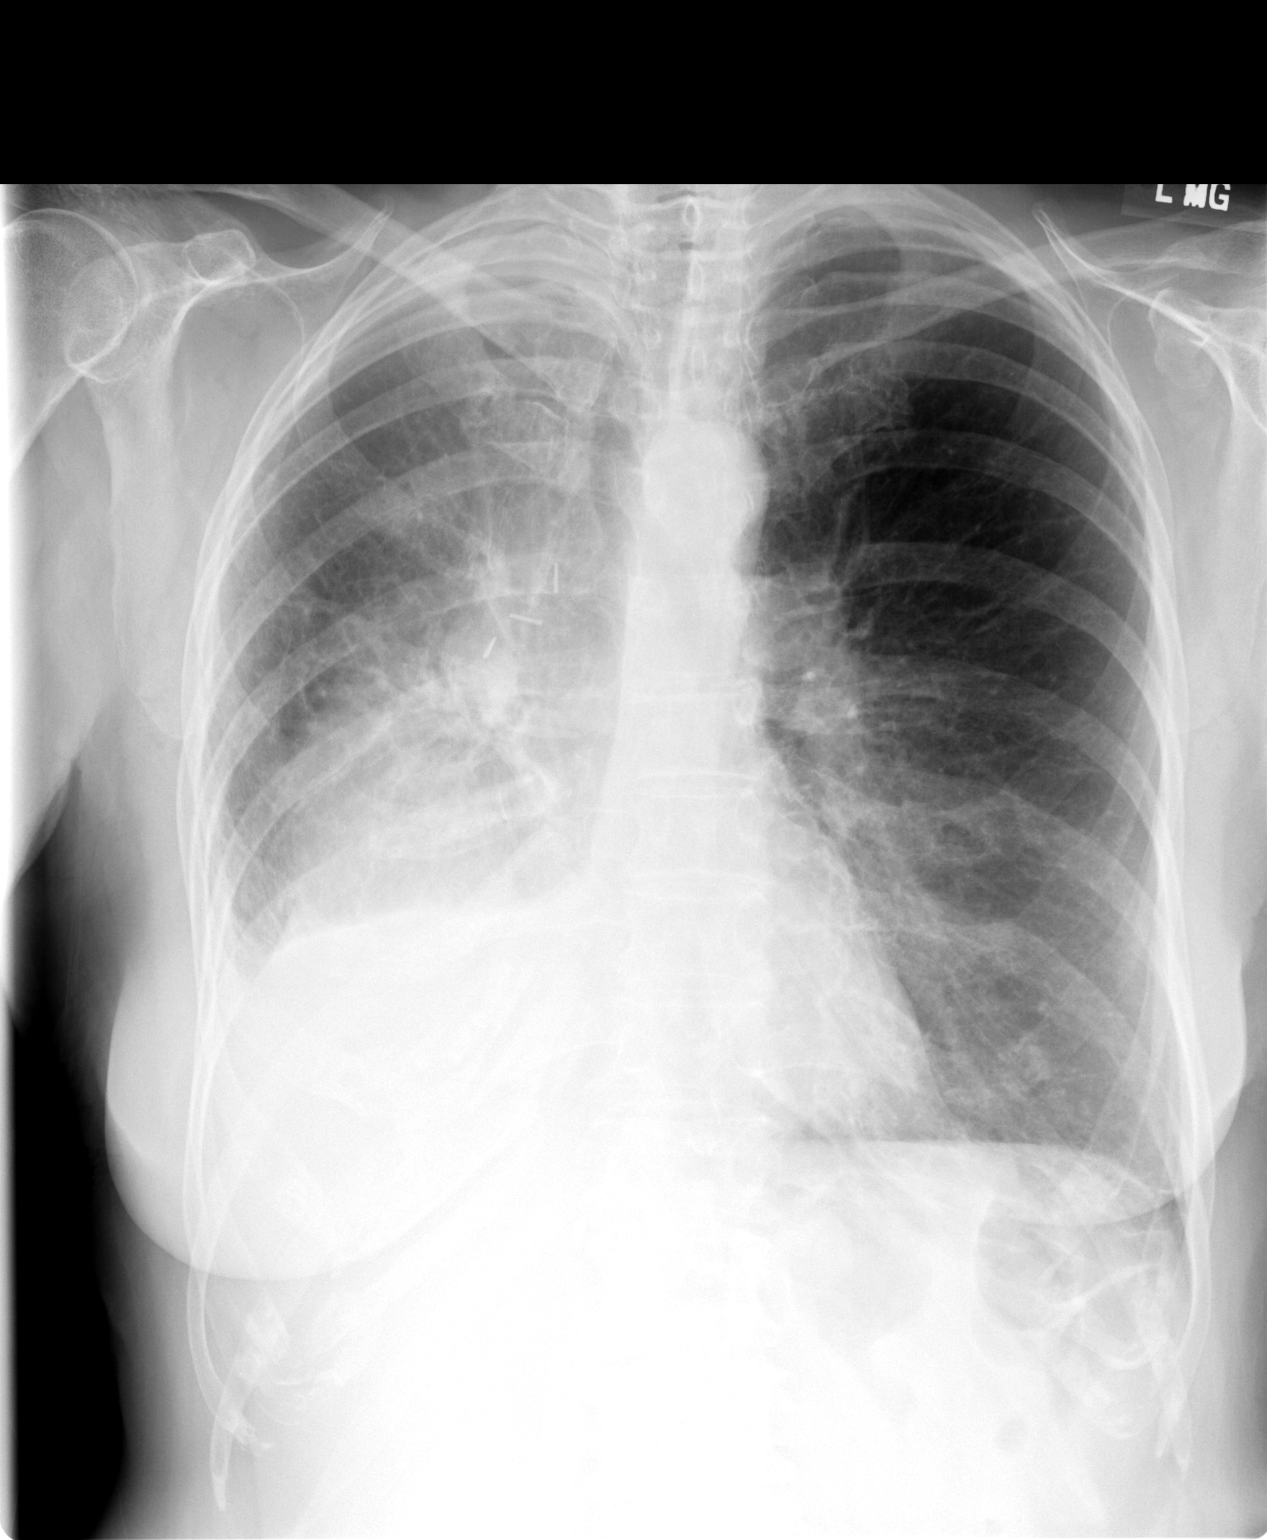

[view not recorded (2 of 2)]
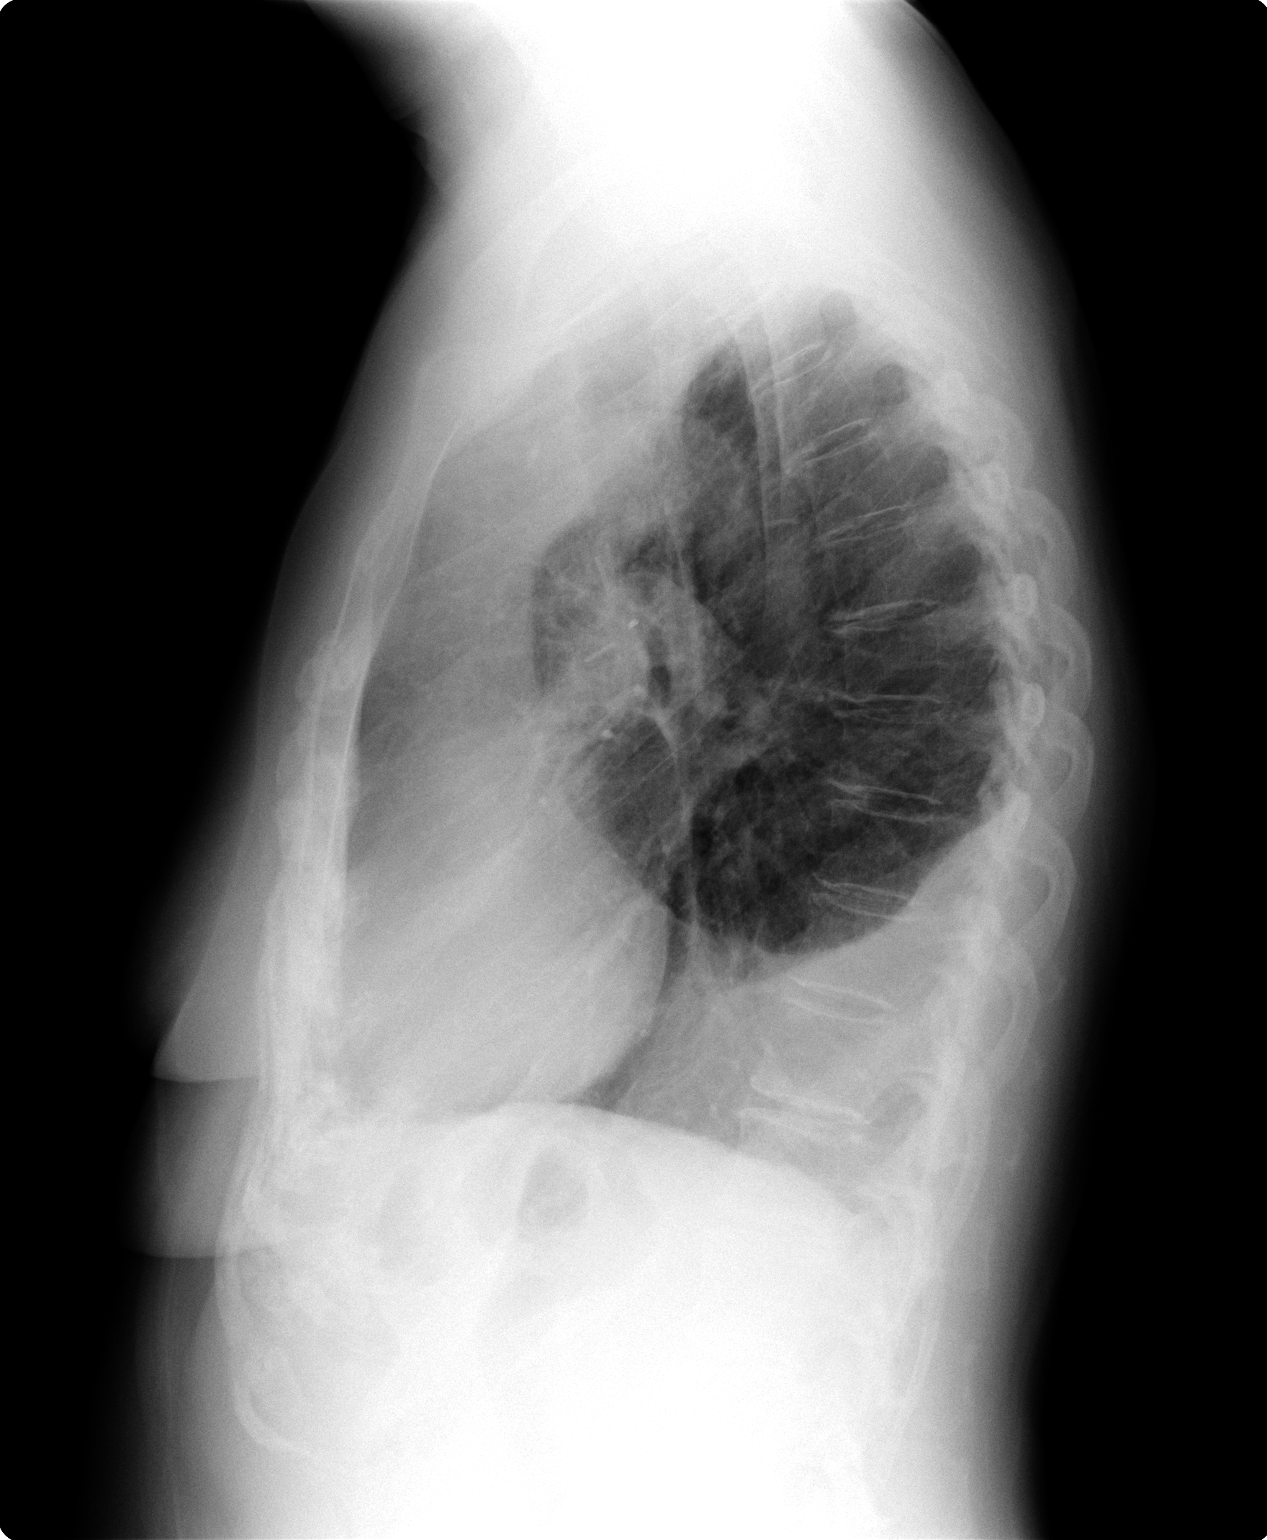

[2 of 2 positions shown; findings below may reference images not displayed]

## 2005-06-28 IMAGING — CR DG CHEST 2V
2 series · 2 of 2 positions shown · non-contrast
Comparison: none

CLINICAL DATA: Lung lesion. 
 CHEST 
 Two views of the chest are compared to a chest x-ray of 11/13/03. There is little change in volume loss on the right with a small right pleural effusion and surgical clips overlying the right hilum. The left lung is clear.  Heart size is stable. 
 IMPRESSION
 No change in opacity at the right lung base consistent with volume loss and effusion.

[view not recorded (1 of 2)]
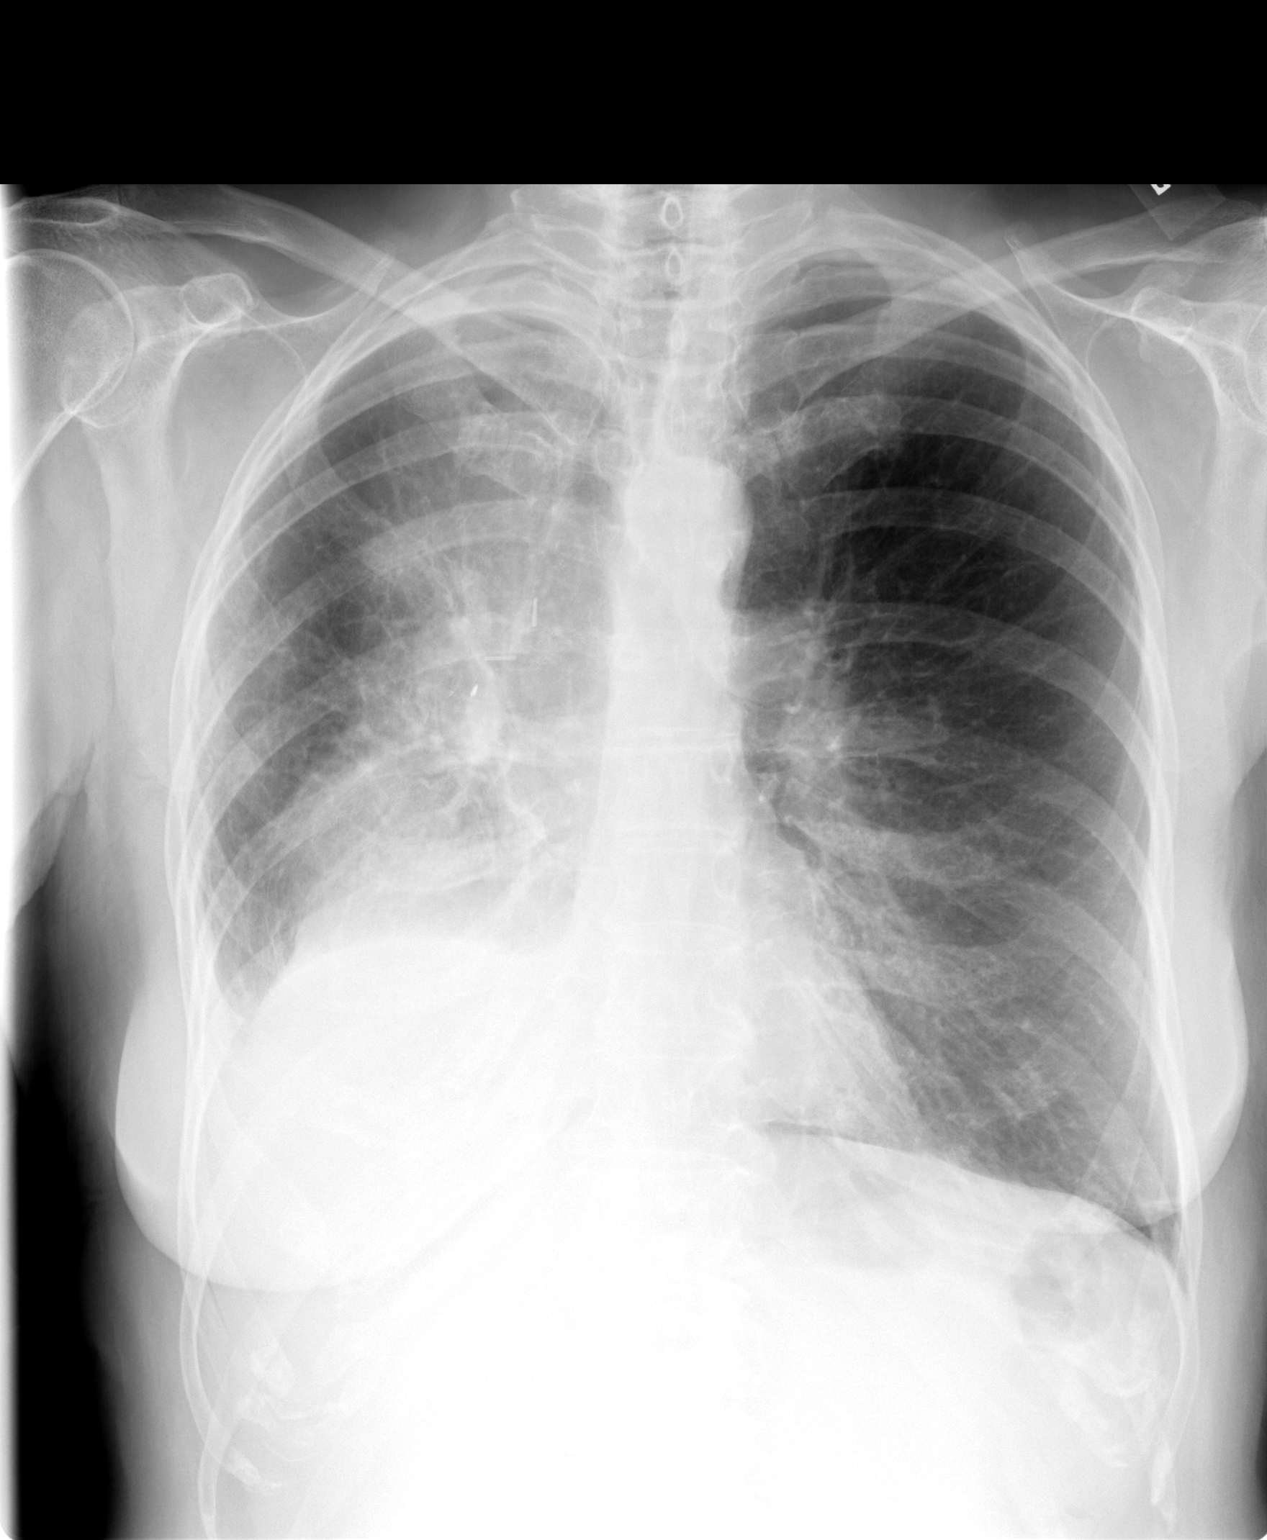

[view not recorded (2 of 2)]
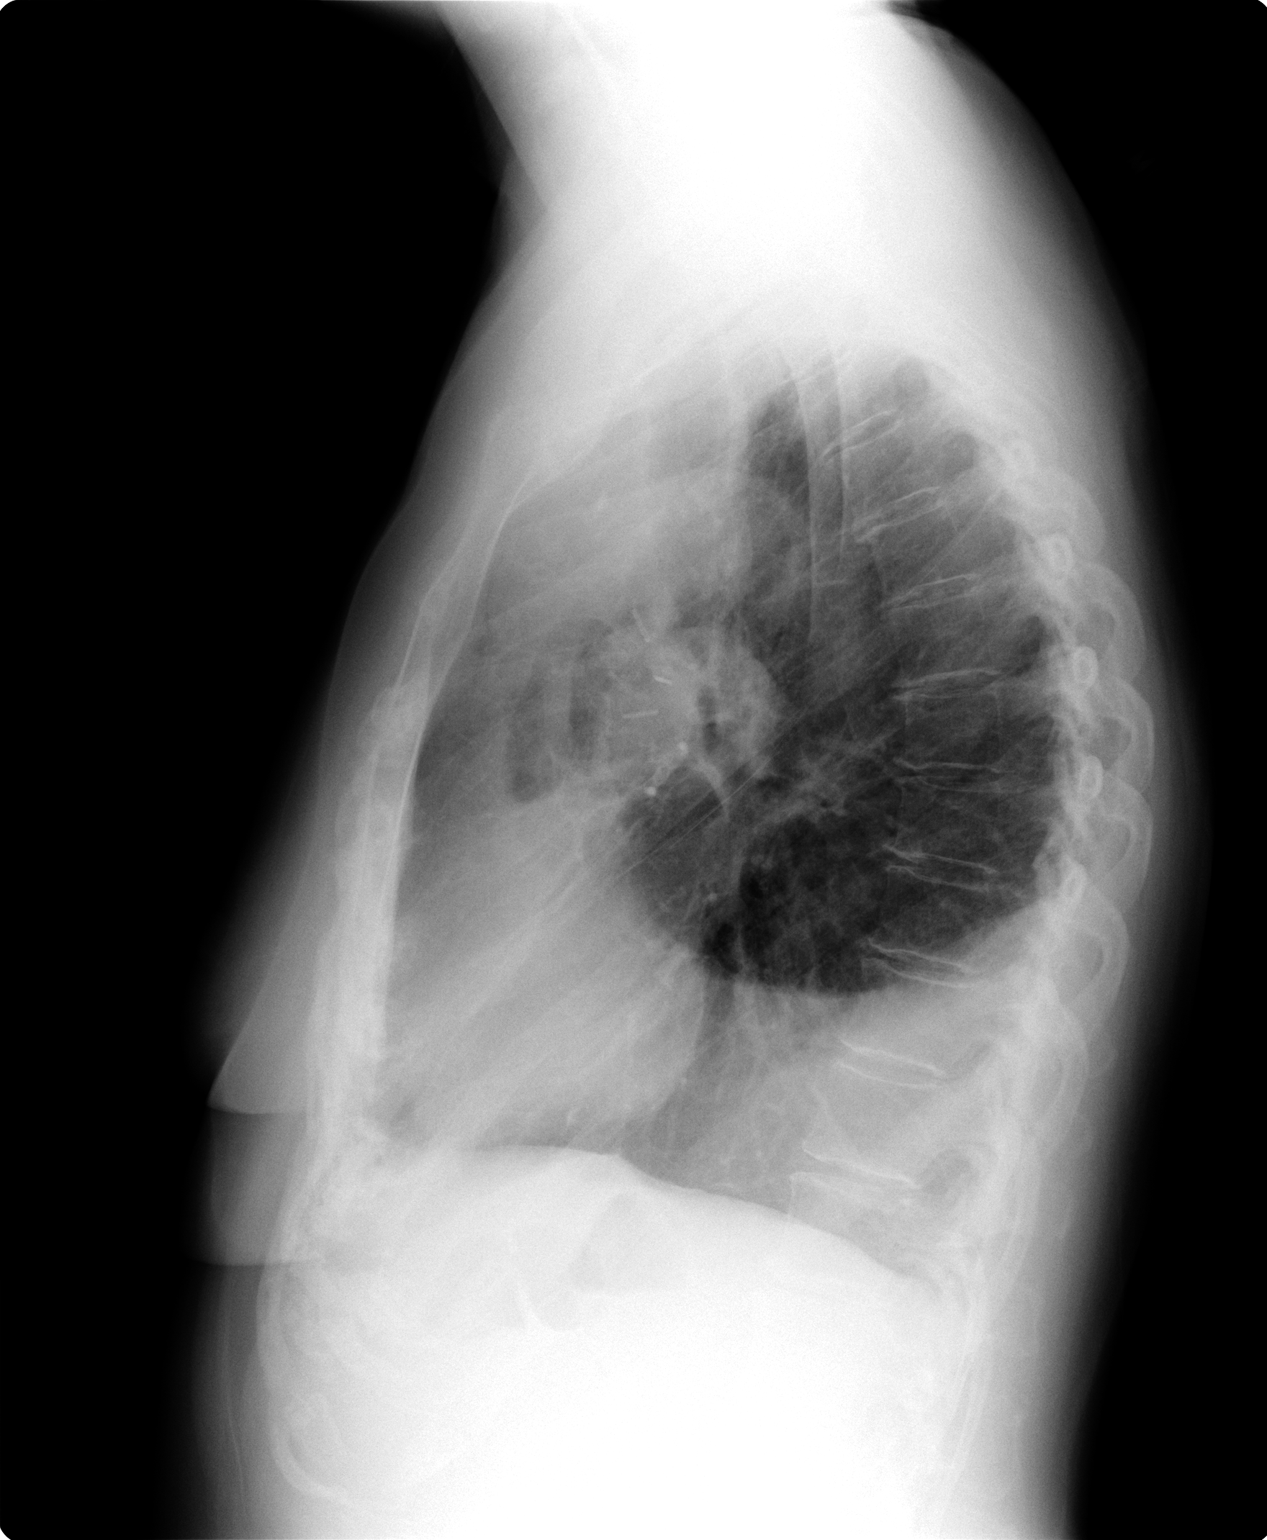

[2 of 2 positions shown; findings below may reference images not displayed]

## 2005-08-08 IMAGING — CR DG CHEST 2V
2 series · 2 of 2 positions shown · non-contrast
Comparison: none

CLINICAL DATA: Right lung cancer ? surgery in Friday September, 2003.  Some cough and shortness of breath.
 CHEST, TWO VIEWS
 Comparison 12/04/03.
 Overall improvement of aeration of the right lung but there is still some residual atelectasis.  Left lung clear.  
 IMPRESSION
 Postoperative changes on the right with some improvement of aeration.  No definite acute process.

[view not recorded (1 of 2)]
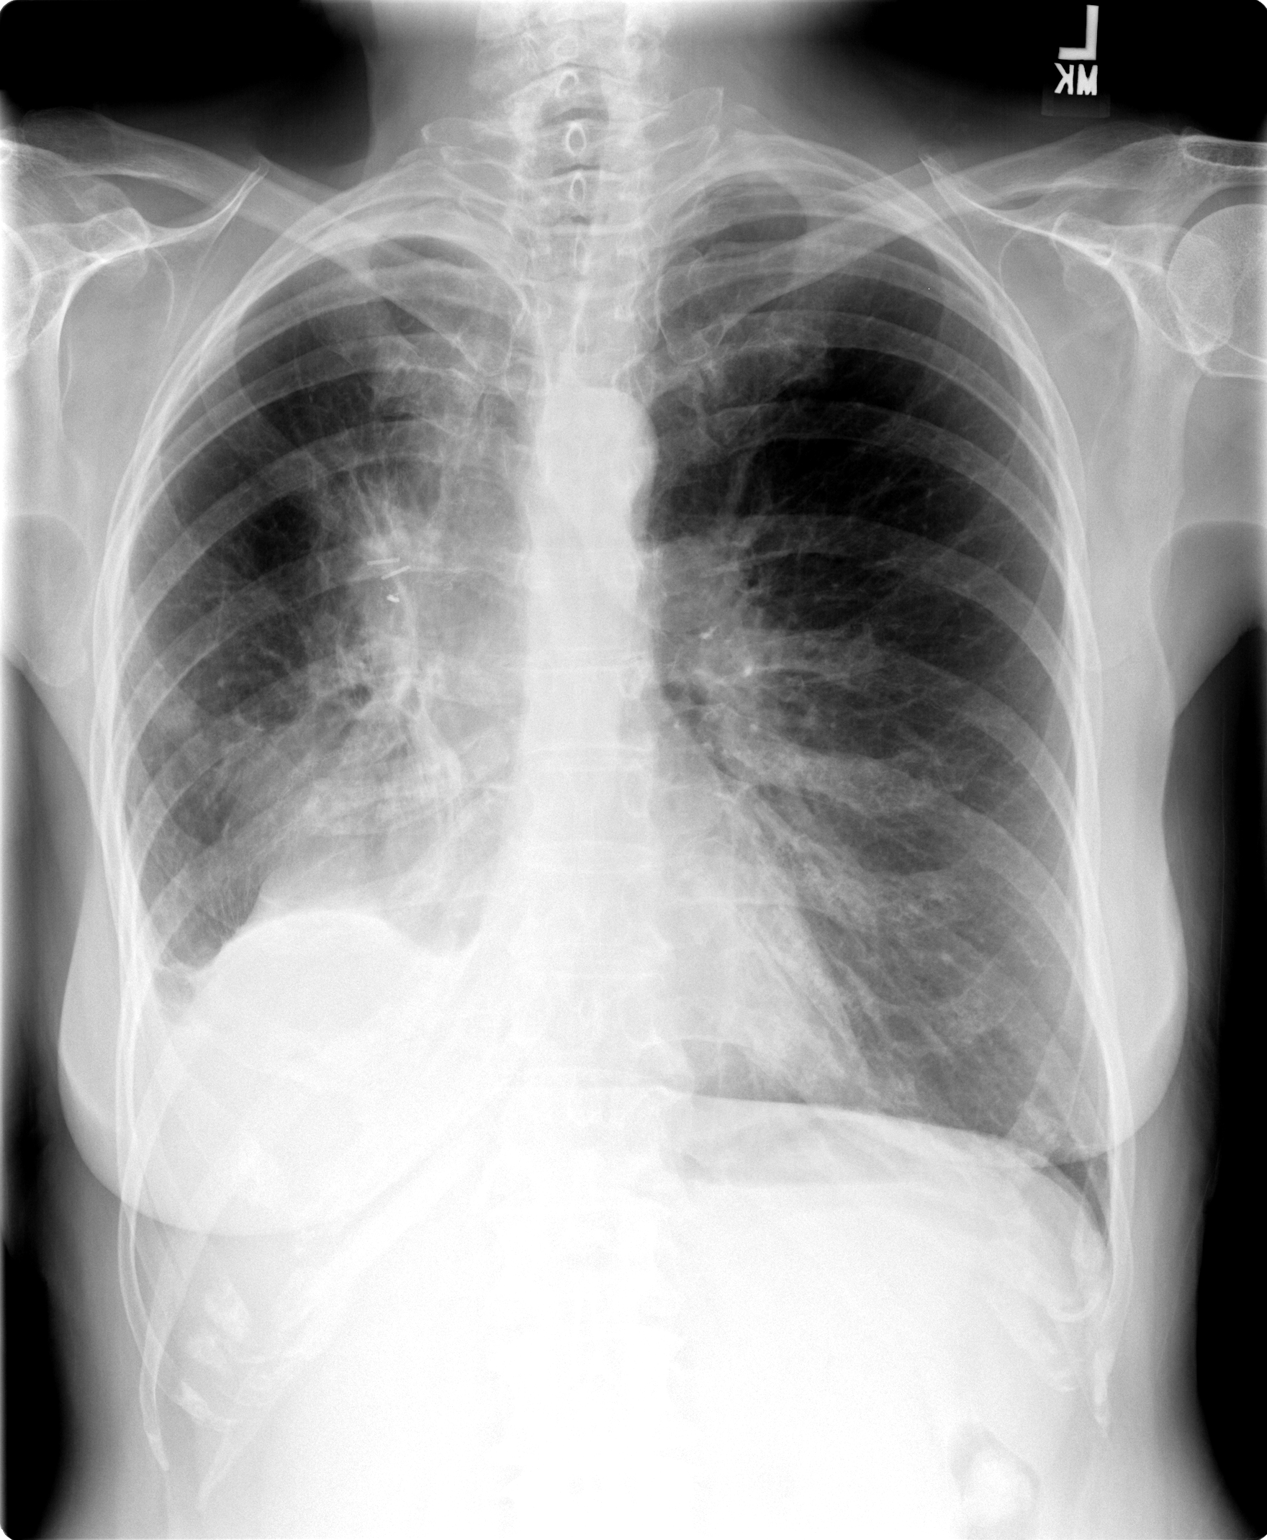

[view not recorded (2 of 2)]
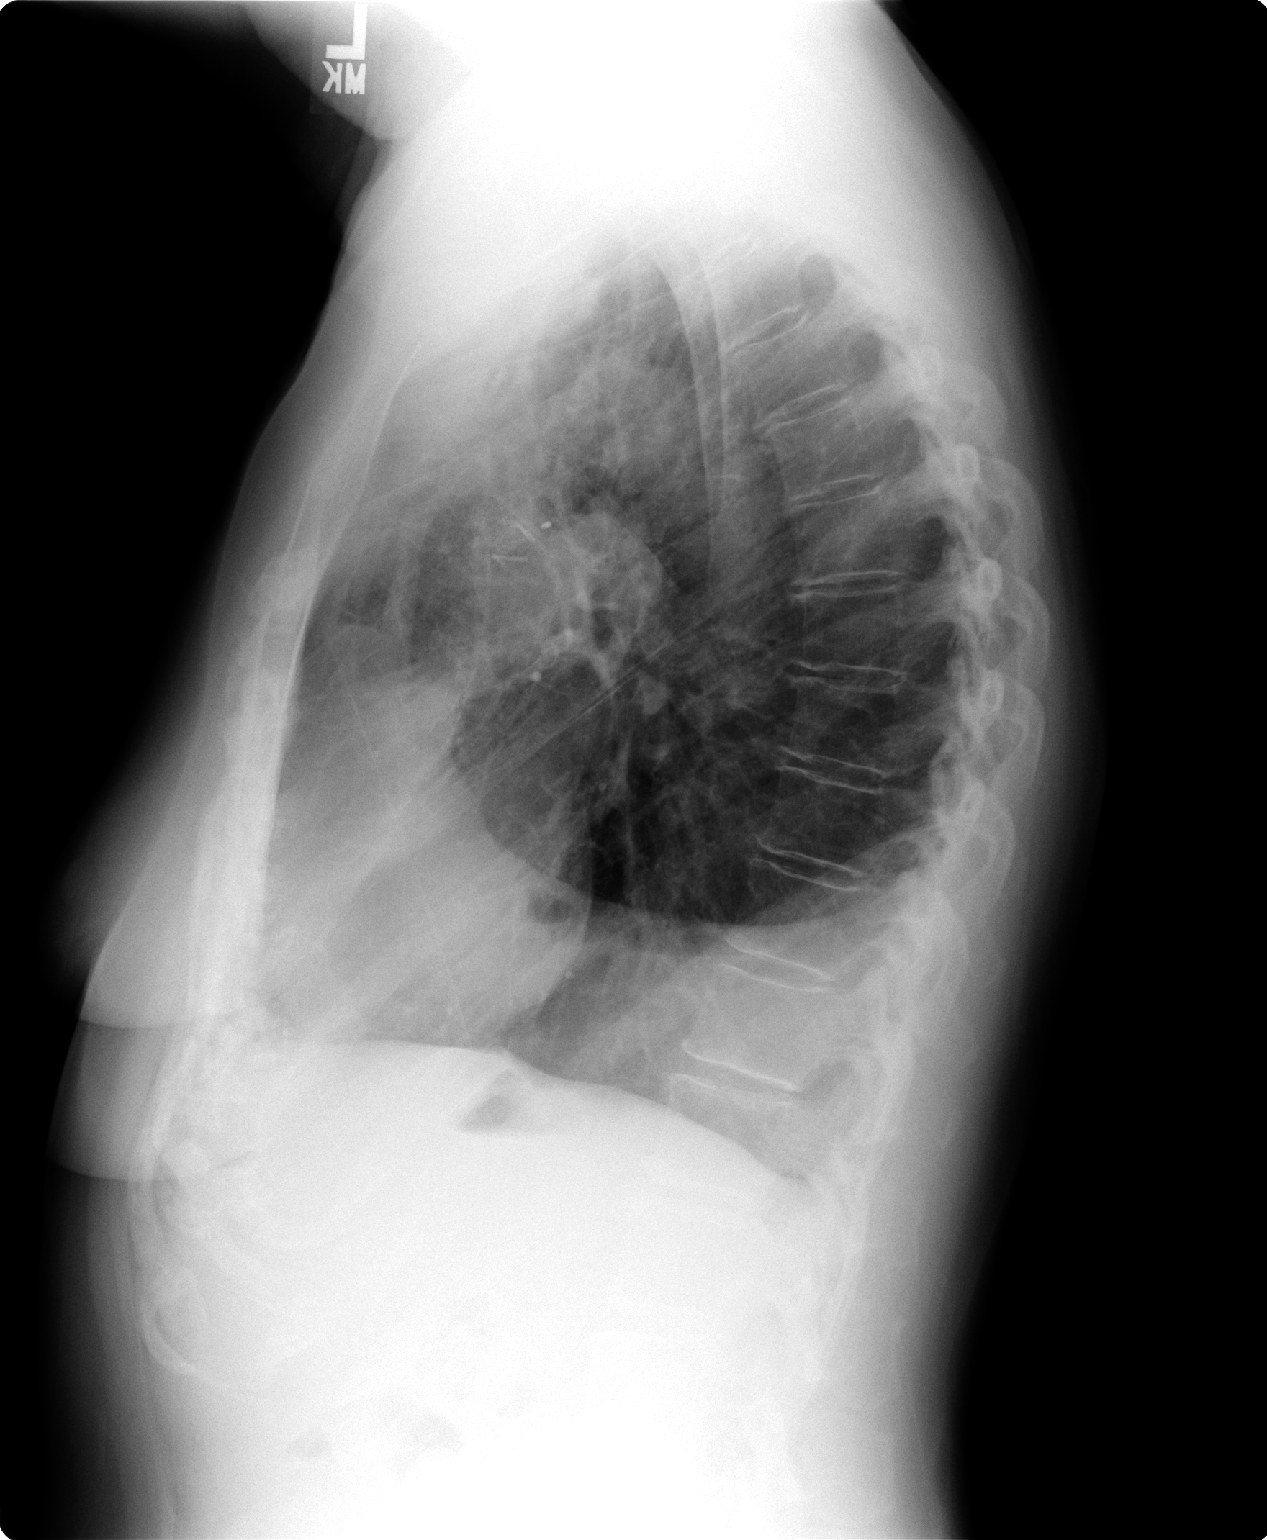

[2 of 2 positions shown; findings below may reference images not displayed]

## 2005-09-27 ENCOUNTER — Ambulatory Visit: Payer: Self-pay | Admitting: Oncology

## 2005-09-28 ENCOUNTER — Encounter: Admission: RE | Admit: 2005-09-28 | Discharge: 2005-09-28 | Payer: Self-pay | Admitting: Thoracic Surgery

## 2005-09-28 LAB — COMPREHENSIVE METABOLIC PANEL
ALT: 19 U/L (ref 0–40)
CO2: 31 mEq/L (ref 19–32)
Calcium: 9 mg/dL (ref 8.4–10.5)
Chloride: 101 mEq/L (ref 96–112)
Creatinine, Ser: 0.8 mg/dL (ref 0.4–1.2)

## 2005-09-28 LAB — CBC WITH DIFFERENTIAL/PLATELET
Basophils Absolute: 0 10*3/uL (ref 0.0–0.1)
Eosinophils Absolute: 0.1 10*3/uL (ref 0.0–0.5)
HCT: 36.7 % (ref 34.8–46.6)
LYMPH%: 28.7 % (ref 14.0–48.0)
MCV: 98.6 fL (ref 81.0–101.0)
MONO#: 0.6 10*3/uL (ref 0.1–0.9)
MONO%: 9.9 % (ref 0.0–13.0)
NEUT#: 3.6 10*3/uL (ref 1.5–6.5)
NEUT%: 59.6 % (ref 39.6–76.8)
Platelets: 278 10*3/uL (ref 145–400)
WBC: 6 10*3/uL (ref 3.9–10.0)

## 2005-09-28 LAB — LACTATE DEHYDROGENASE: LDH: 178 U/L (ref 94–250)

## 2005-10-08 IMAGING — CT CT CHEST W/ CM
1 of 2 series · 14 of 29 positions shown, 18 images · IV contrast (omnipaque)
Comparison: 01/14/04.

CLINICAL DATA: Lung cancer. 
CT OF THE CHEST WITH CONTRAST 03/15/04
TECHNIQUE: Contiguous axial CT images were obtained from the lung apices through the bases following intravenous administration of 100 cc of Omnipaque 300 IV contrast.

[Series 4: — · axial · 0.65mm/px · z∈[-274,+26]mm · 14 of 72 slices shown, 18 images]
[im 6/72  mediastinal]
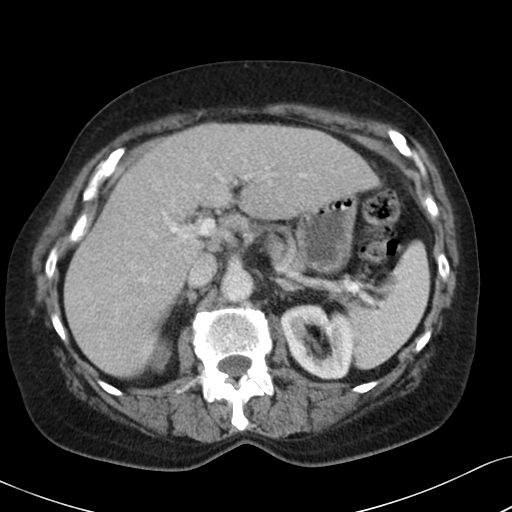
[im 6/72  lung]
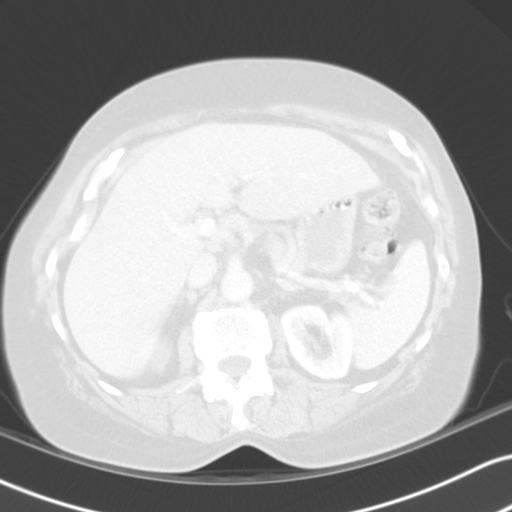
[im 11/72  lung]
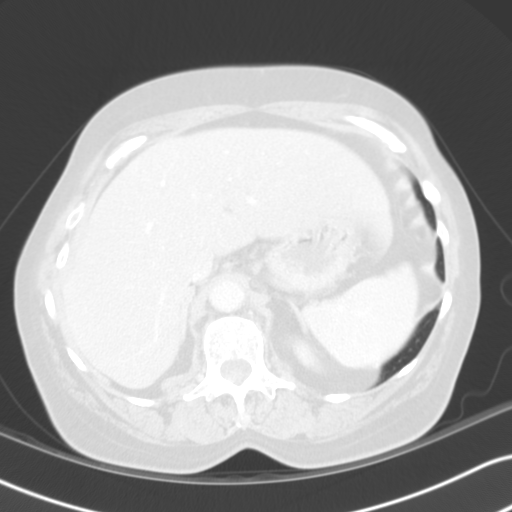
[im 16/72  lung]
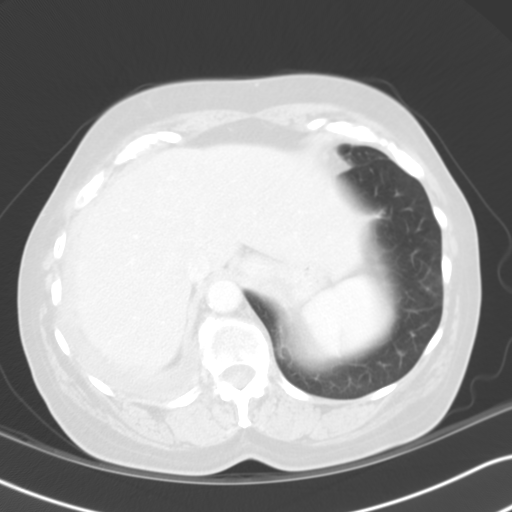
[im 21/72  lung]
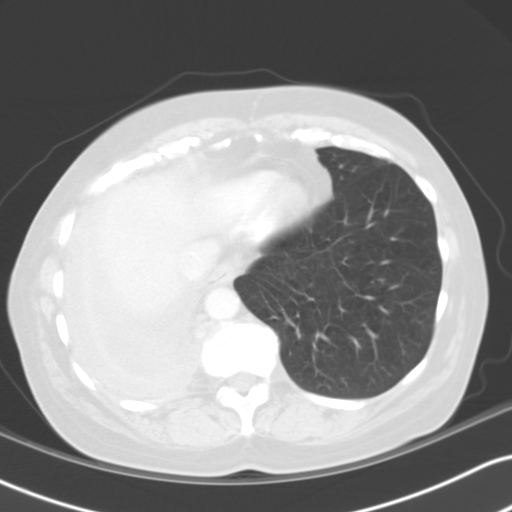
[im 26/72  mediastinal]
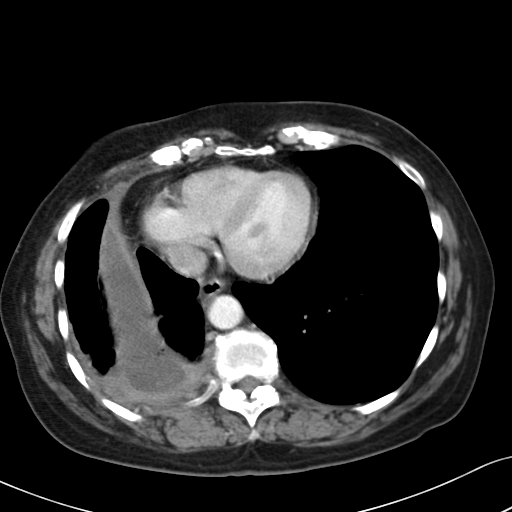
[im 26/72  lung]
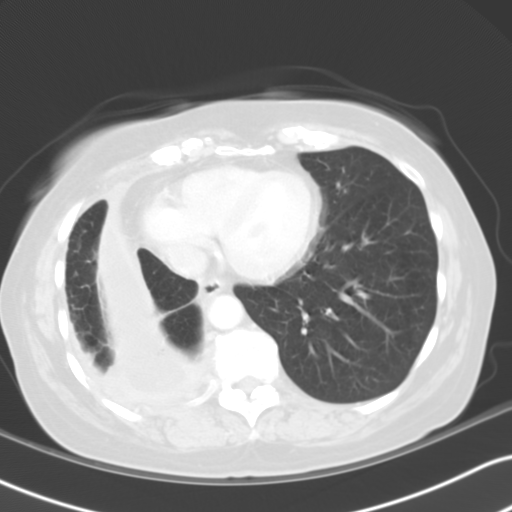
[im 31/72  lung]
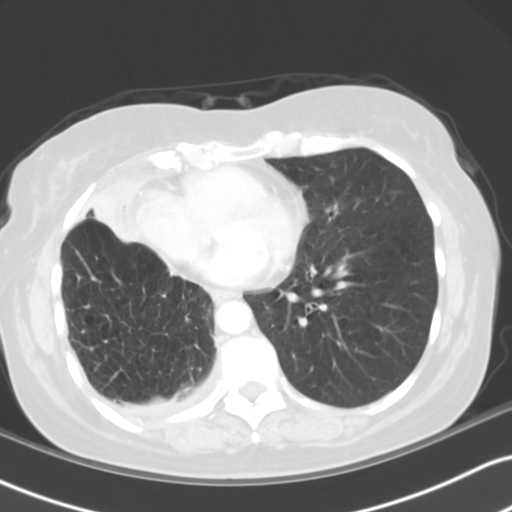
[im 35/72  lung]
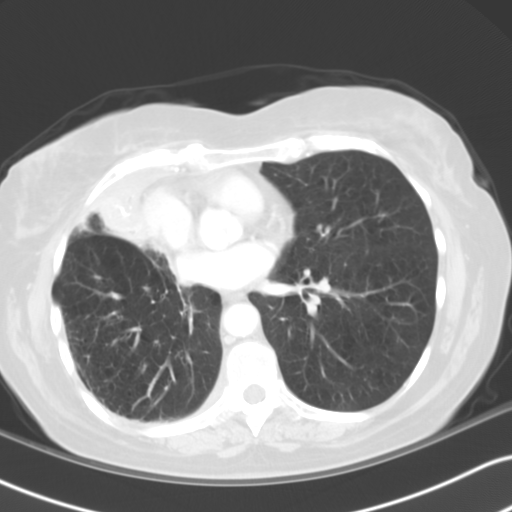
[im 36/72  lung]
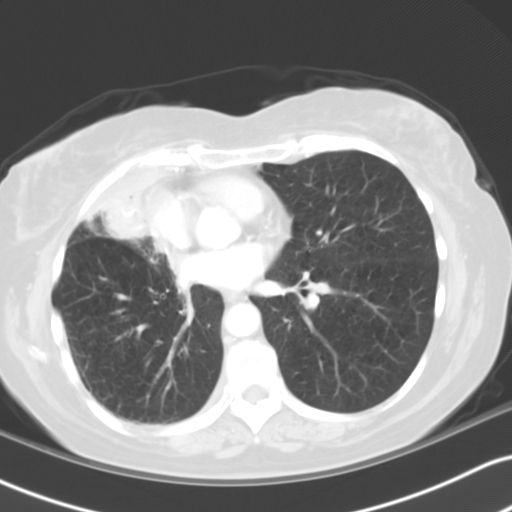
[im 41/72  mediastinal]
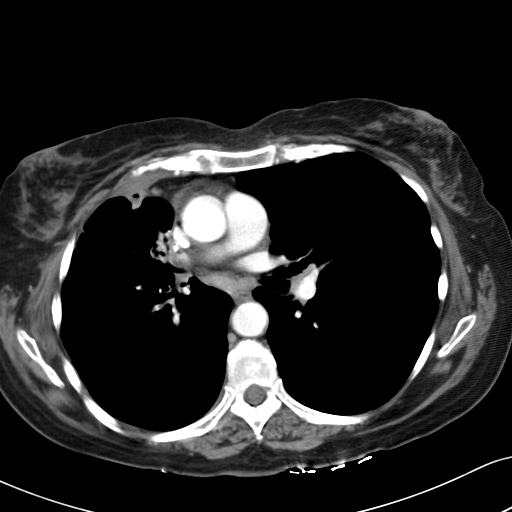
[im 41/72  lung]
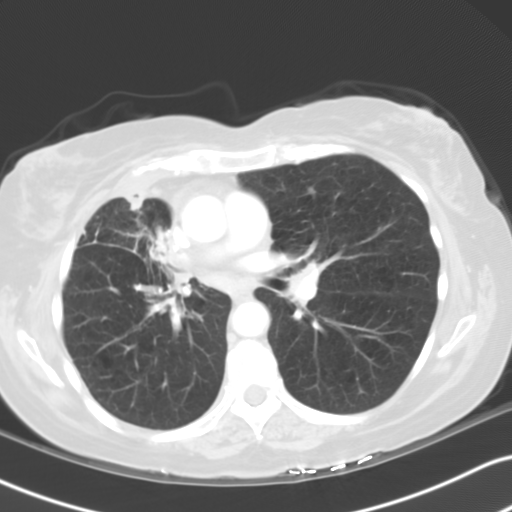
[im 46/72  lung]
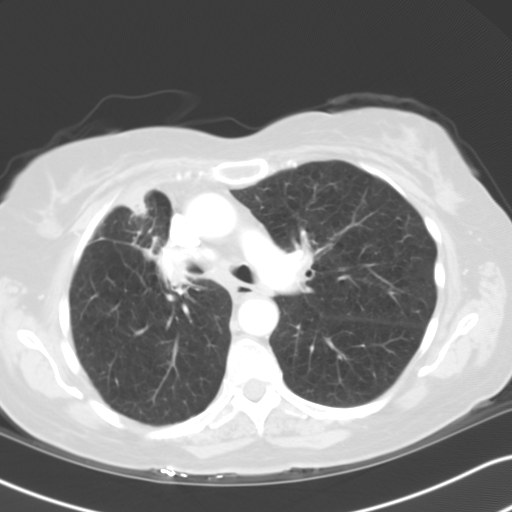
[im 51/72  lung]
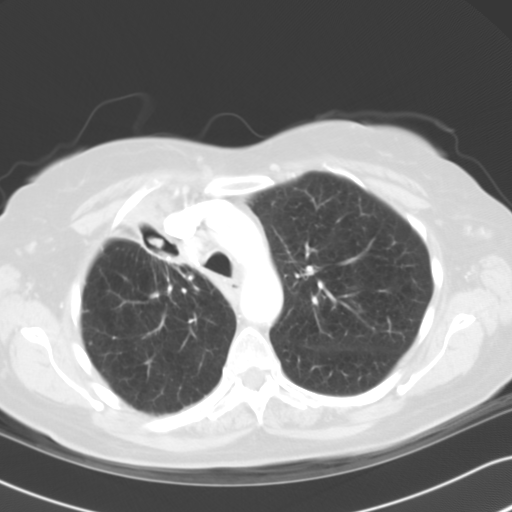
[im 56/72  lung]
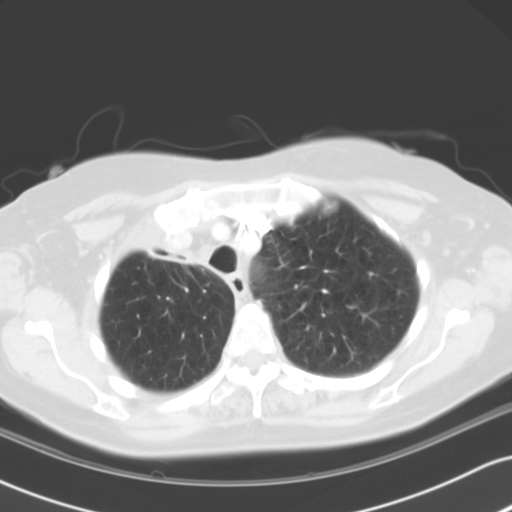
[im 61/72  mediastinal]
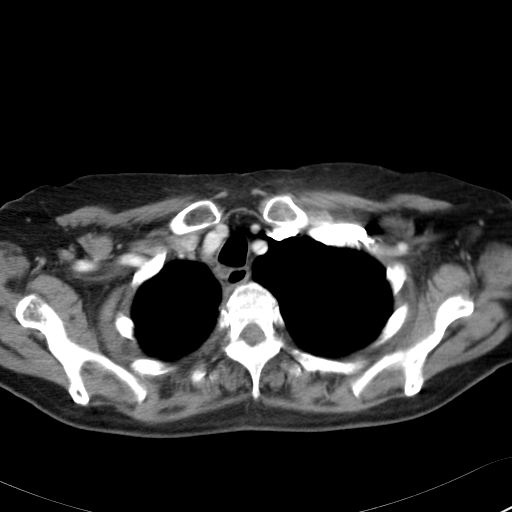
[im 61/72  lung]
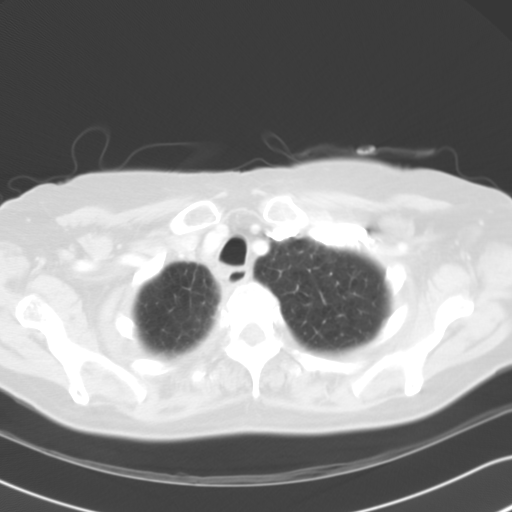
[im 66/72  lung]
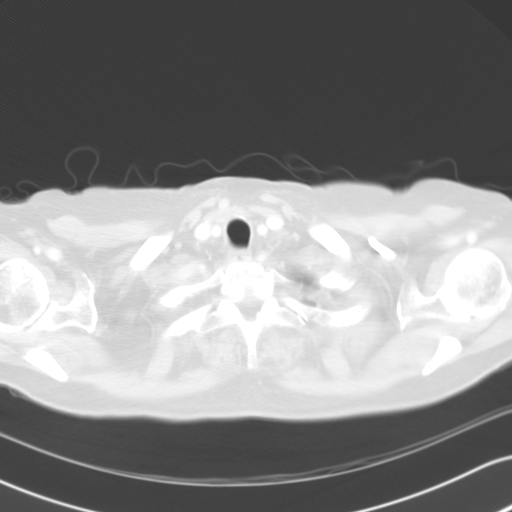

[14 of 29 positions shown; findings below may reference images not displayed]

FINDINGS: There is a stable 3 mm pleural-based nodule along the left lower lobe medially on image #34 of series 5.  Underlying centrilobular emphysema is present.  Postoperative findings noted on the right side.  
There is reduction in size of the right pleural effusion compared to the prior exam.  However, there is now a suggestion of loculation inferiorly.  In addition, in what is assumed to represent either right middle lobe or less likely residuum of the right upper lobe, there is a cavity with an internal filling defect, for example as shown on image #23 of series 5.  I cannot exclude a lung sequestrum within a cavitary lesion as can be encountered in aspergillosis or possibly nocardiosis.  The adjacent mediastinal lymph nodes including right paratracheal lymph nodes appear small and reactive and are reduced in size compared to the prior exam.  There is a suggestion of some tiny loculations of gas along a loculation of the right pleural effusion.  No obvious extrathoracic sinus tract is noted. 
IMPRESSION
1.  Reduced size of right pleural effusion, but the pleural effusion currently appears loculated.  There is an apparent cavity with a filling defect which could represent atelectatic lung projecting within the cavity, but could also represent a sequestrum or fungus ball.  We do not demonstrate the typical halo sign of invasive aspergillosis at this time.
2.  Emphysema.

## 2005-10-09 IMAGING — CR DG CHEST 2V
2 series · 2 of 2 positions shown · non-contrast
Comparison: 01/14/04.

CLINICAL DATA: Lung lesion status post right sided surgery, [DATE].  Now with congestion, shortness of breath, and cough.
 CHEST, TWO VIEWS ? 03/16/04

[view not recorded (1 of 2)]
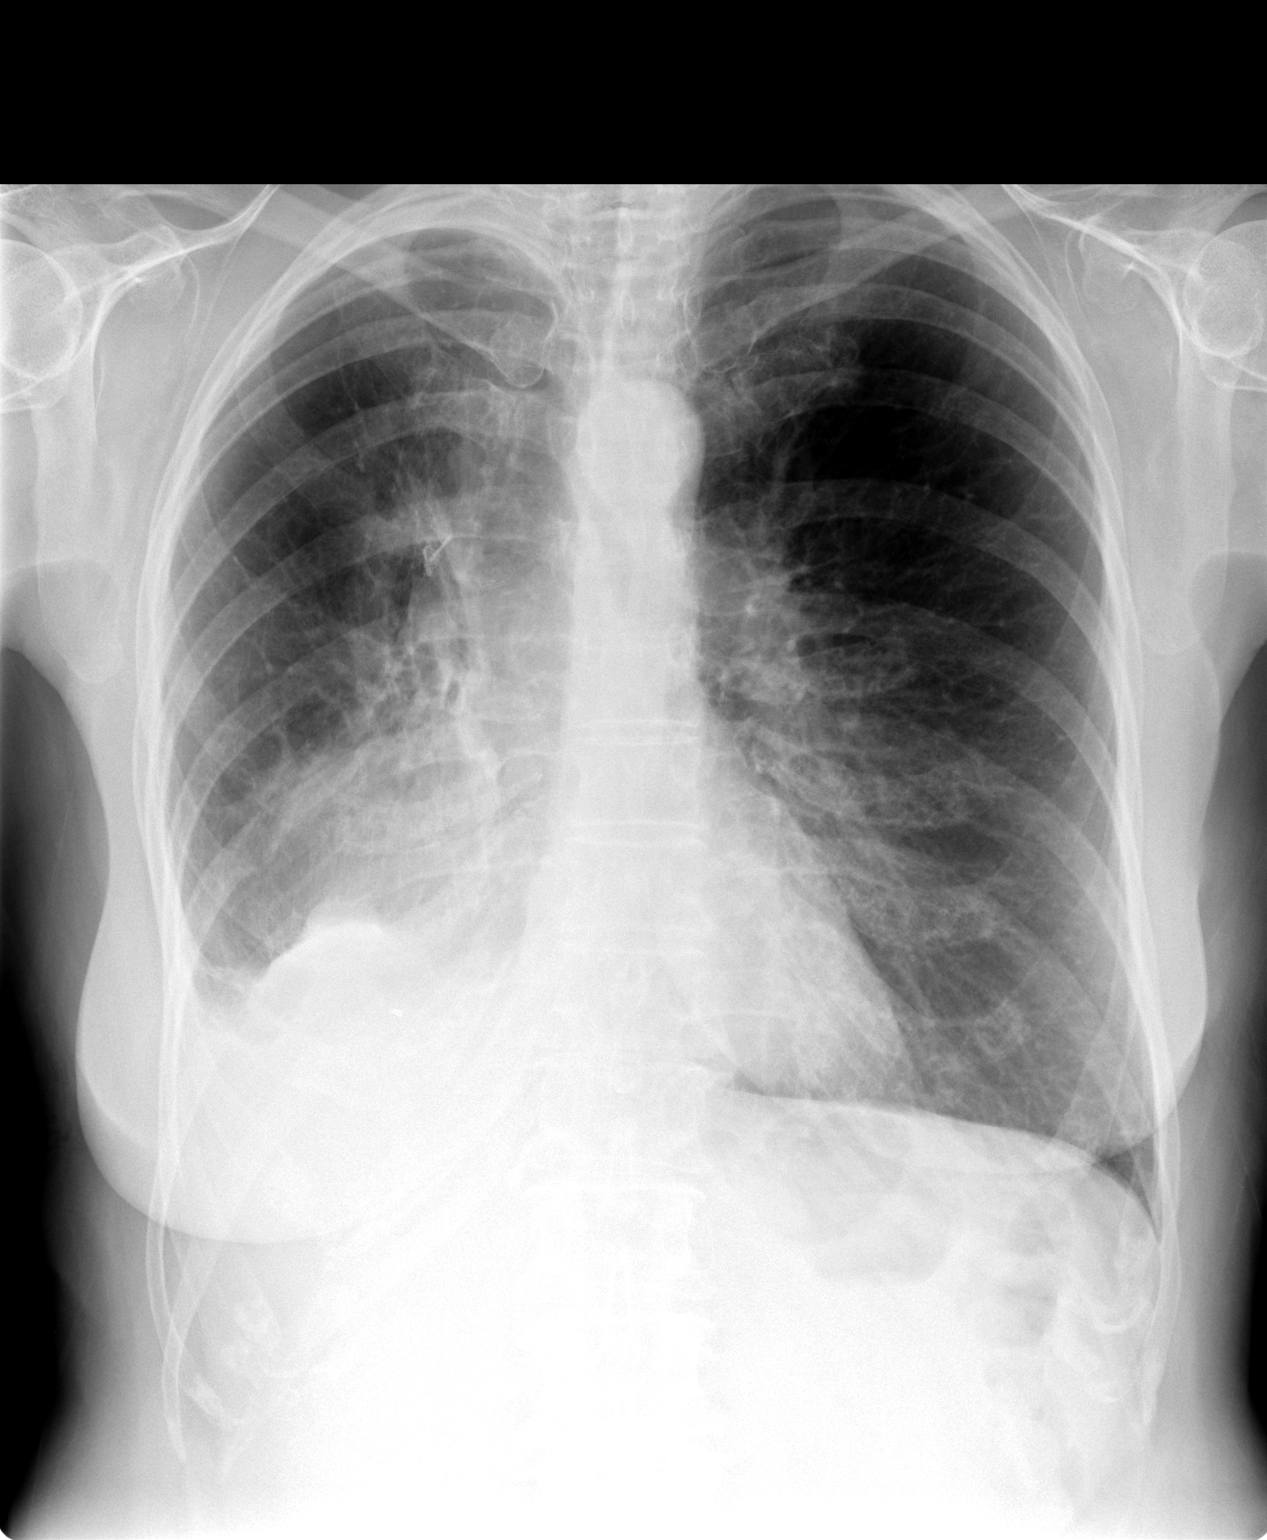

[view not recorded (2 of 2)]
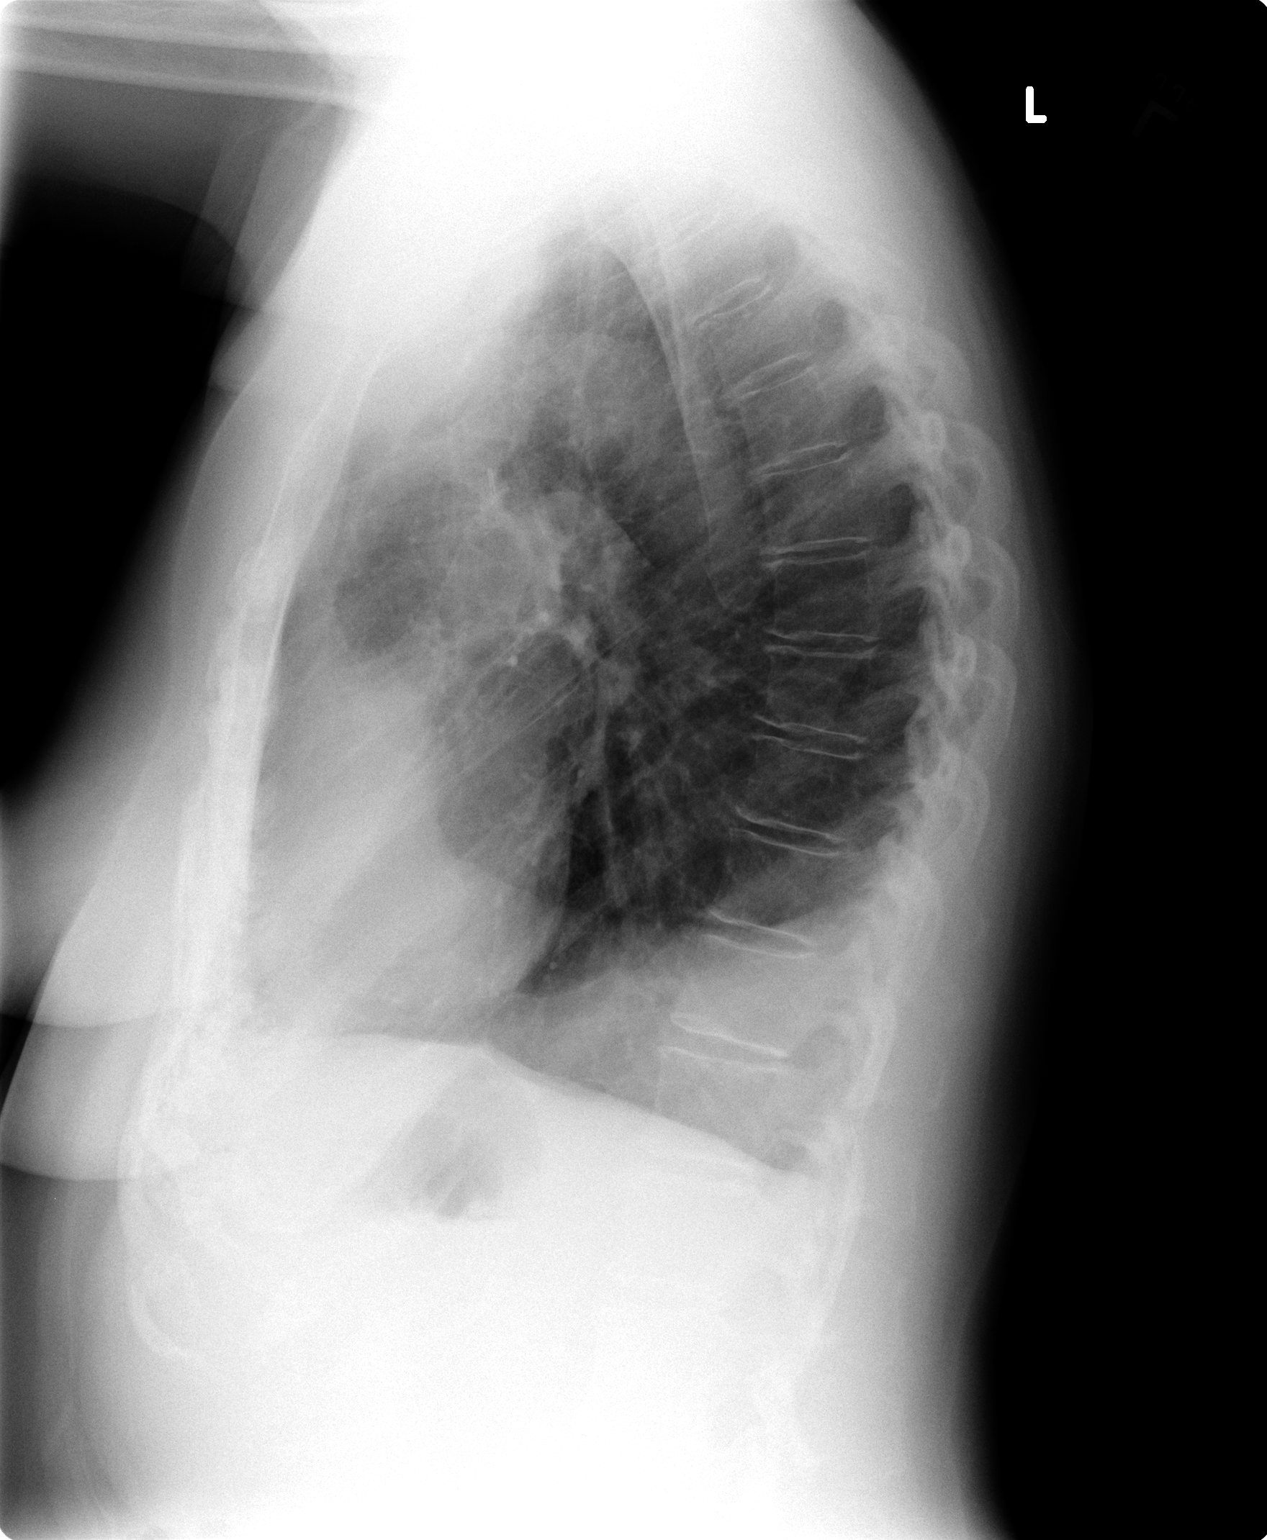

[2 of 2 positions shown; findings below may reference images not displayed]

Postoperative changes are again noted on the right.  Somewhat rounded density is now noted at the right base.  Cannot completely exclude right basilar nodule or mass.  COPD changes present.  
 IMPRESSION
 1.  Postoperative changes on the right.
 2.  New rounded density in the right base.  Cannot completely exclude mass.  This could be further evaluated with chest CT.

## 2005-10-24 IMAGING — CR DG CHEST 2V
2 series · 2 of 2 positions shown · non-contrast
Comparison: none

CLINICAL DATA: Preop for right lung lesion.
 TWO VIEW CHEST 
 Comparison 03/16/04. 
 Extensive right basilar pleural parenchymal changes as before.  There is still a possible rounded density at the right base.  Left lung remains clear.  Underlying chronic lung changes stable. 
 IMPRESSION
 Extensive pleural parenchymal changes on the right ? no significant change.

[view not recorded (1 of 2)]
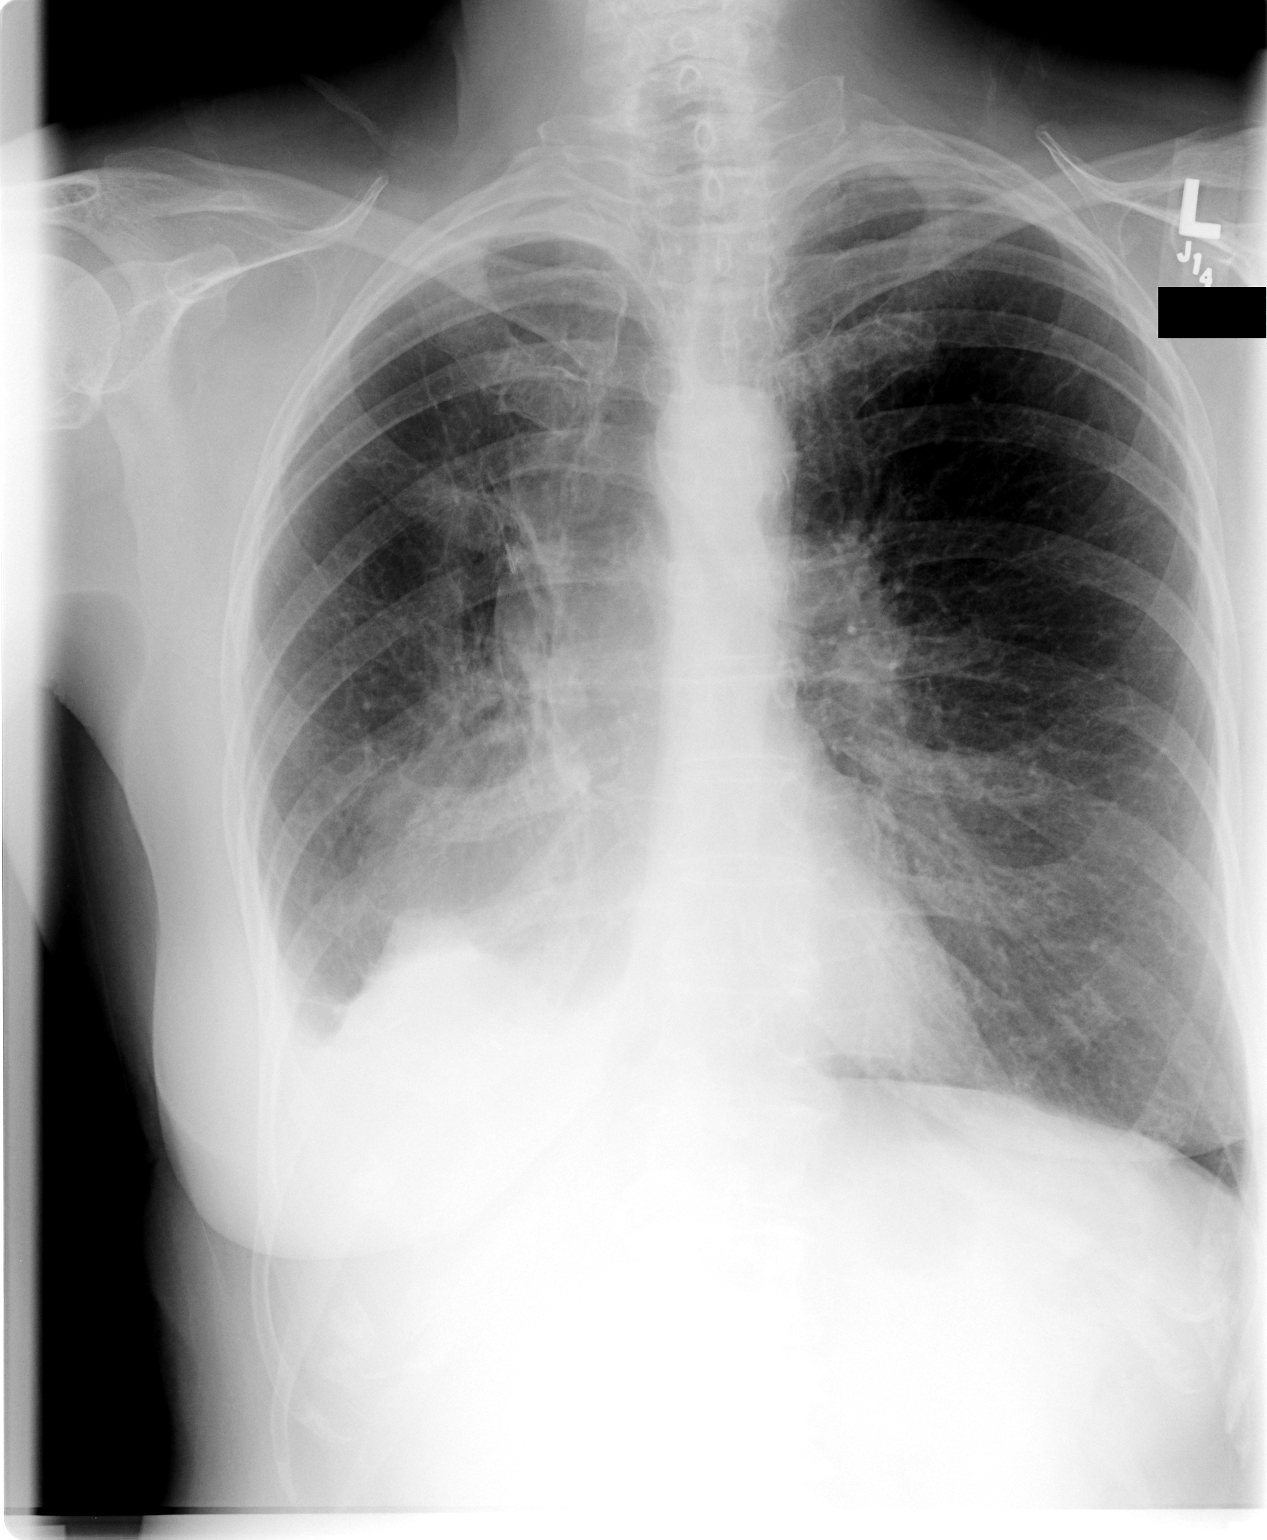

[view not recorded (2 of 2)]
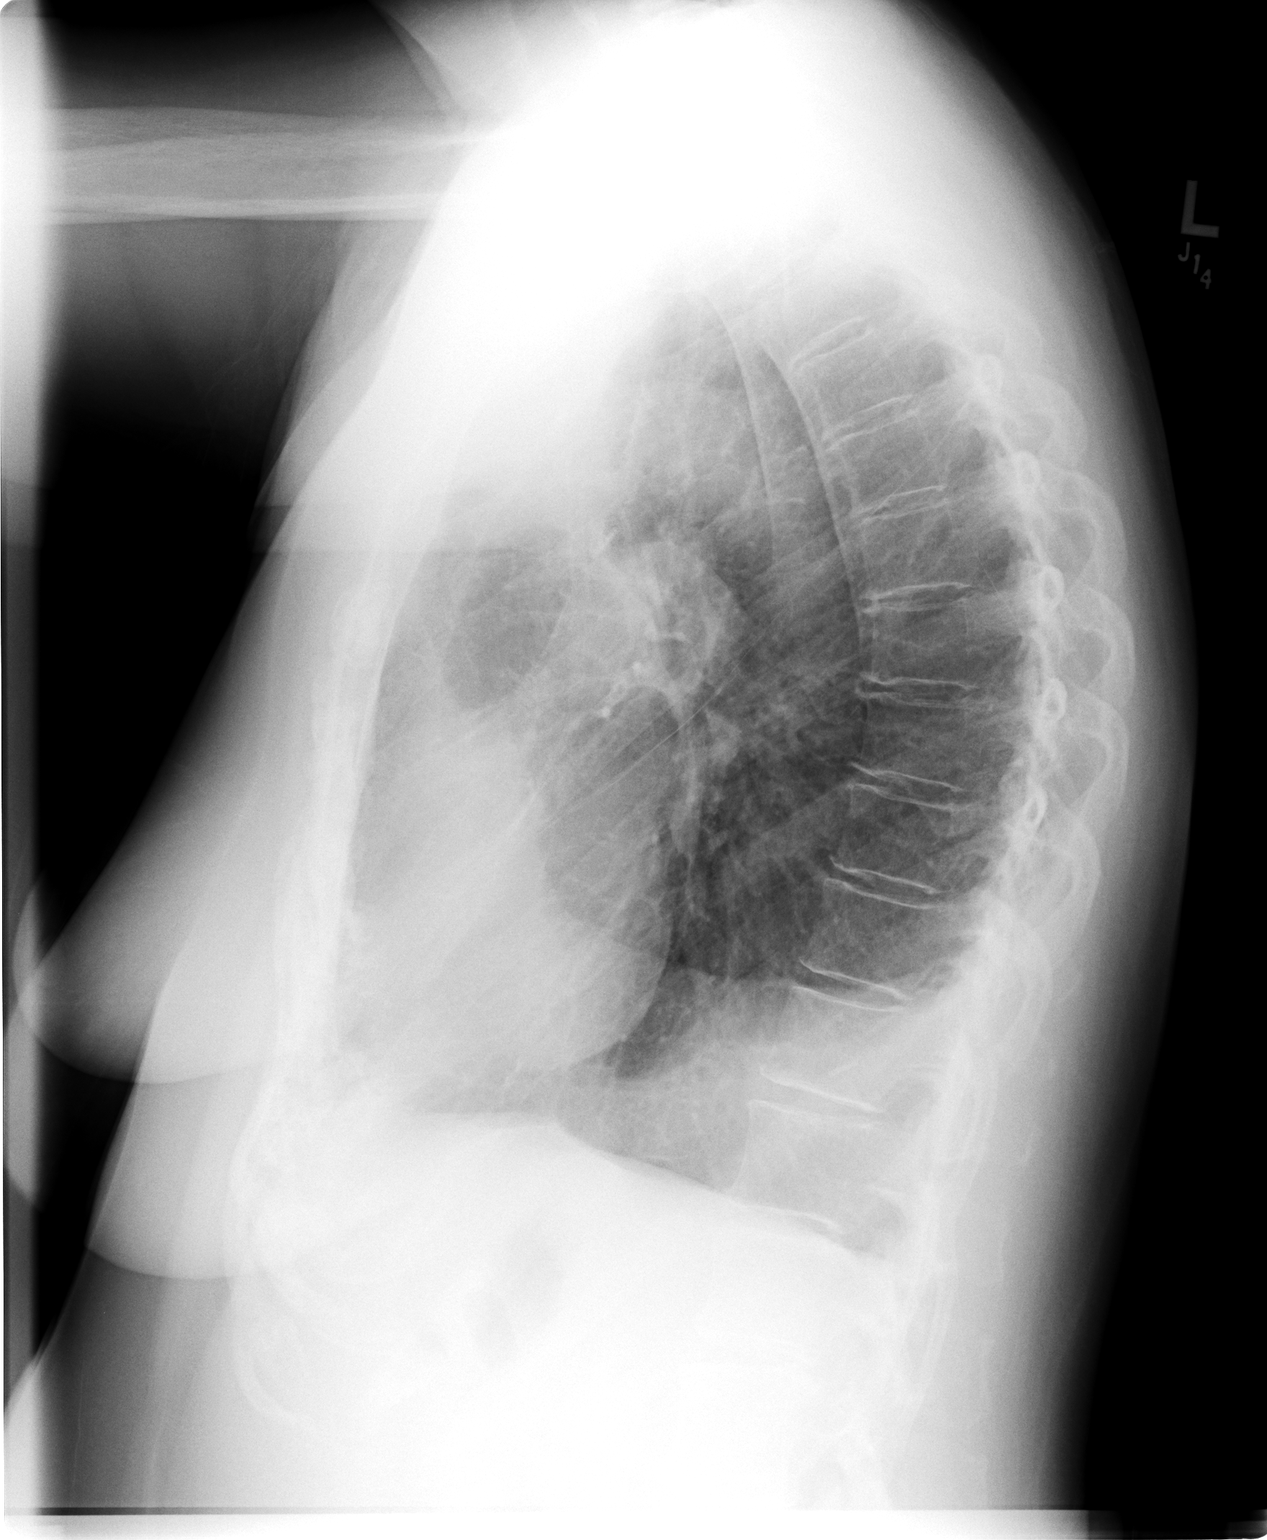

[2 of 2 positions shown; findings below may reference images not displayed]

## 2005-10-24 IMAGING — CR DG ELBOW 2V*R*
2 series · 2 of 2 positions shown · non-contrast
Comparison: none

CLINICAL DATA: Right elbow pain.  Lung lesion.
 RIGHT ELBOW TWO VIEWS
 Two views were done with portable apparatus.  No fractures or lytic lesions to bone are identified.  No joint effusion.  There is some degenerative spurring of the proximal ulna.  
 Soft tissues normal.
 IMPRESSION
 Degenerative changes--no fracture or lytic/destructive lesions.

[view not recorded (1 of 2)]
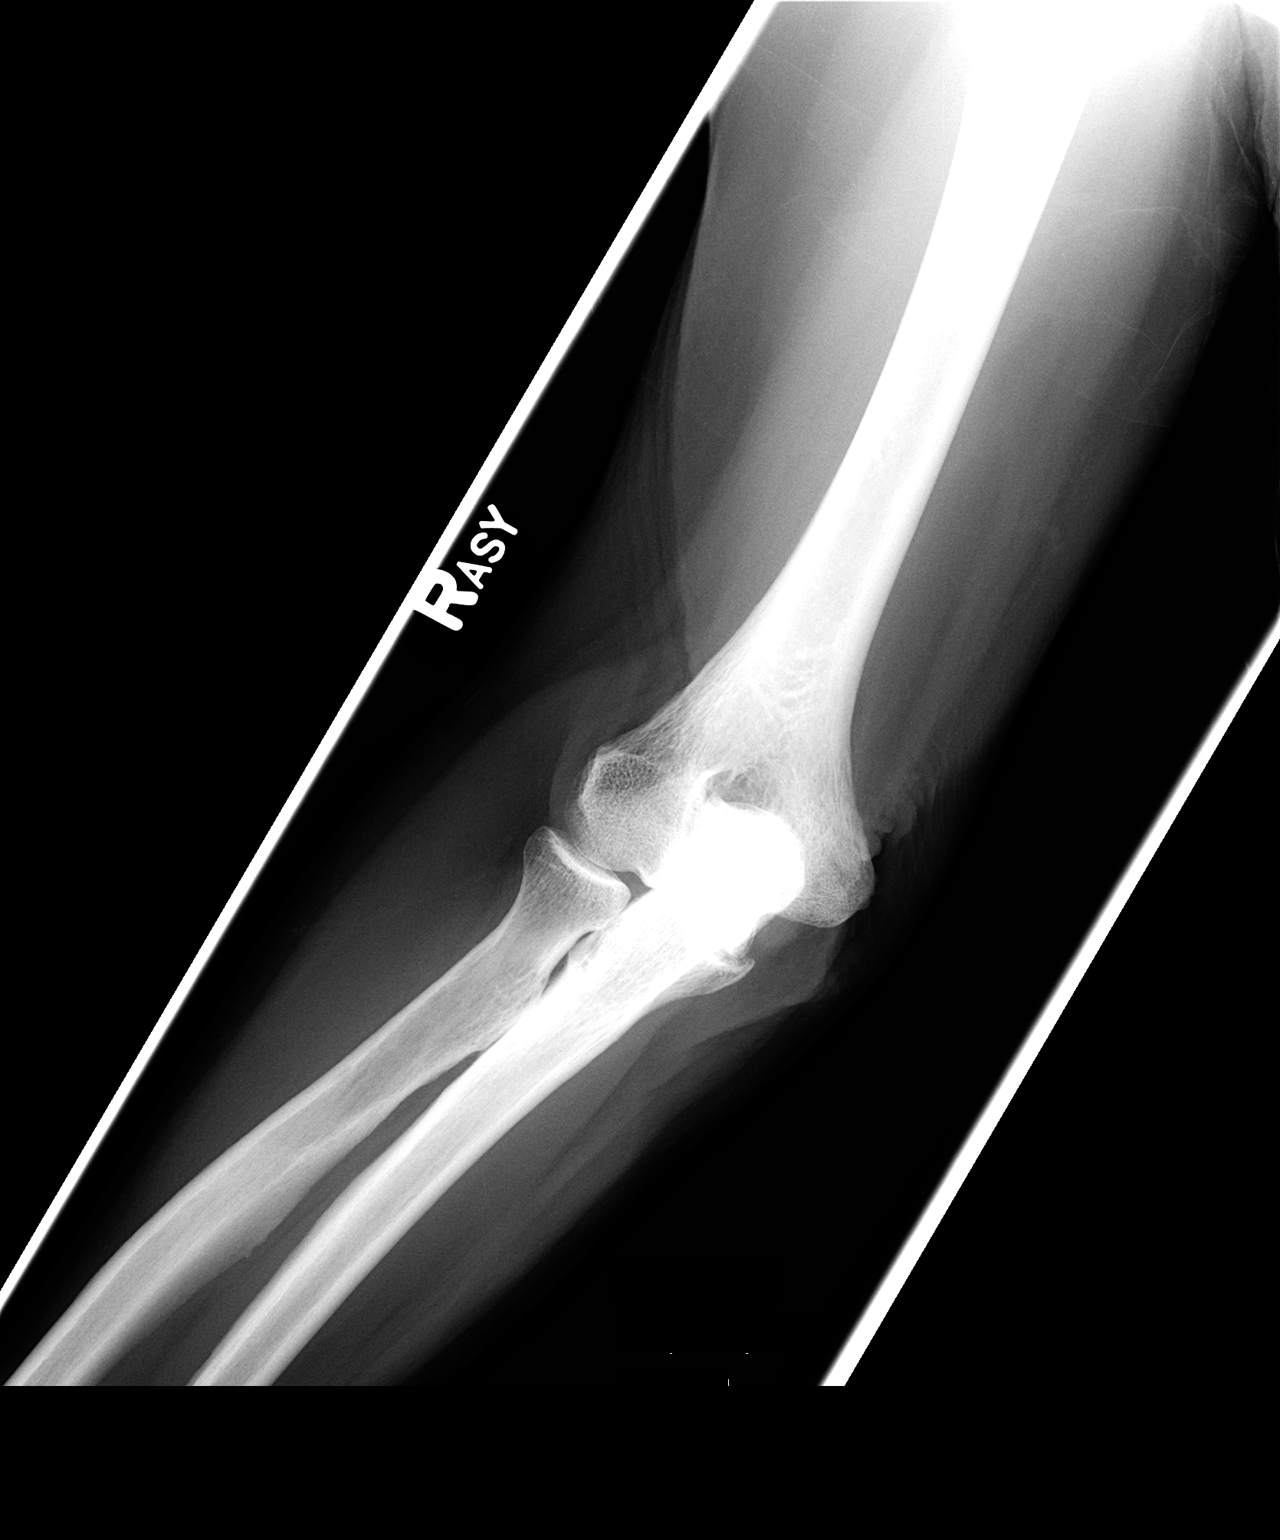

[view not recorded (2 of 2)]
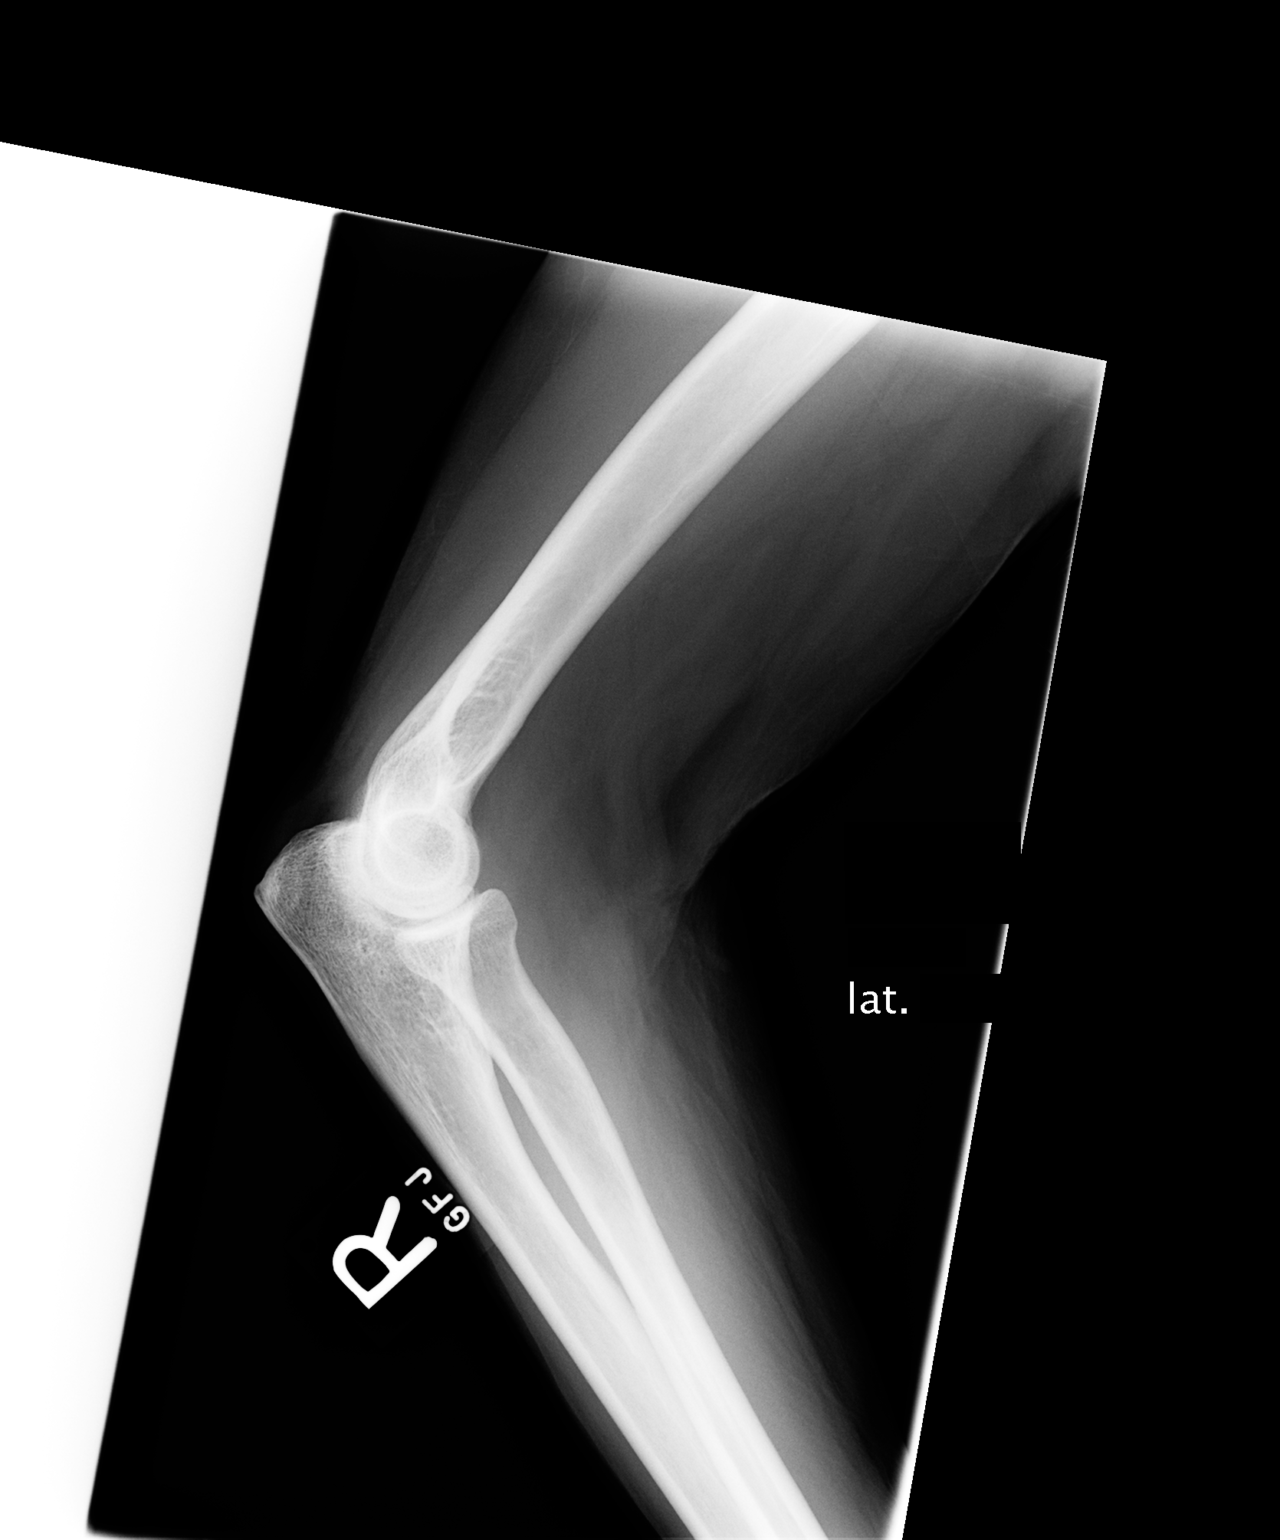

[2 of 2 positions shown; findings below may reference images not displayed]

## 2006-01-02 ENCOUNTER — Encounter: Admission: RE | Admit: 2006-01-02 | Discharge: 2006-01-02 | Payer: Self-pay | Admitting: Internal Medicine

## 2006-02-13 IMAGING — CR DG CHEST 2V
2 series · 2 of 2 positions shown · non-contrast
Comparison: none

CLINICAL DATA: Follow-up carcinoma of the lung.  Patient has had shortness of breath and upper respiratory symptoms for 4-5 days.  Patient is a non-smoker.  
 TWO VIEWS OF THE CHEST:
 PA and lateral views of the chest are made and are compared to the previous studies of 06/29/2004 and show again the opacities at the right inferior hilus, right base.  There is also blunting of the right costophrenic angle.  There is diffuse peribronchial thickening with some flattening of the hemidiaphragms.  Mild increase in AP diameter of the chest suggests a degree of chronic obstructive lung change.  No definite acute infiltrate, consolidation, or pneumothorax is seen.  Heart appears to be within the normal limit.  Aorta is minimally elongated and calcified.  The bones show osteopenia.

[view not recorded (1 of 2)]
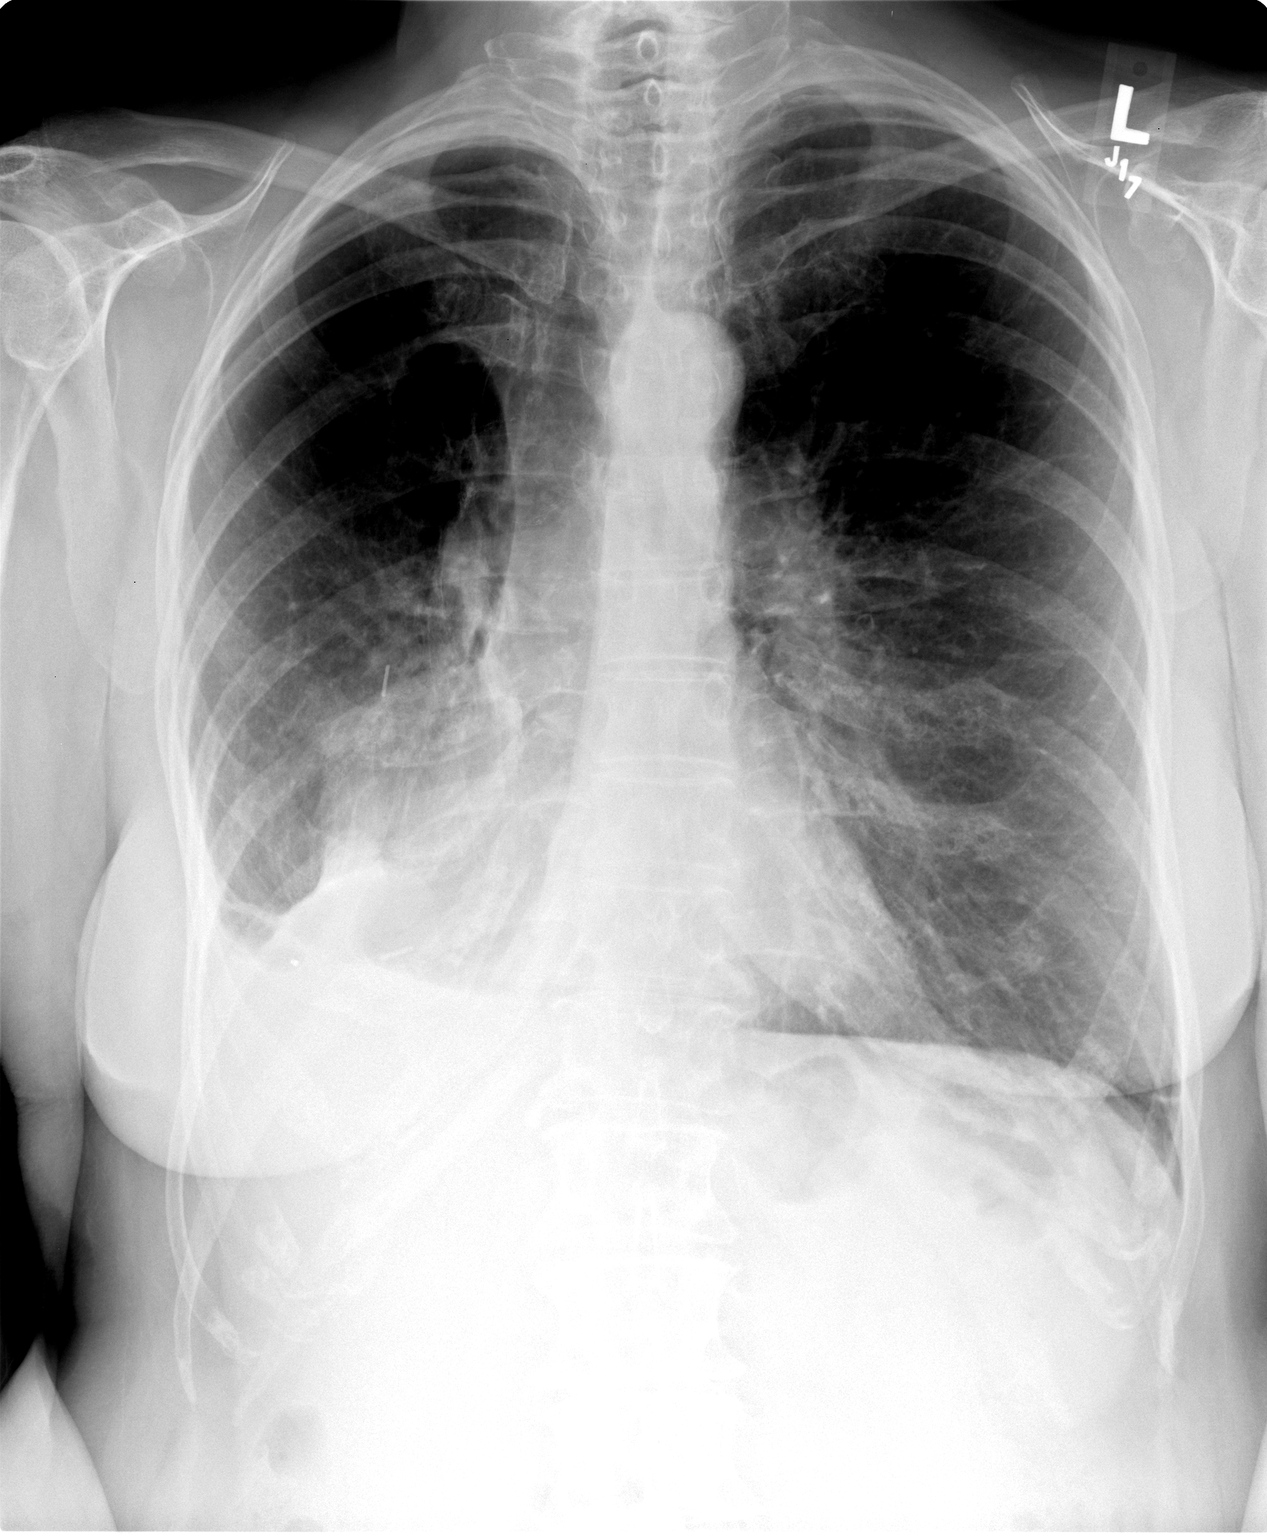

[view not recorded (2 of 2)]
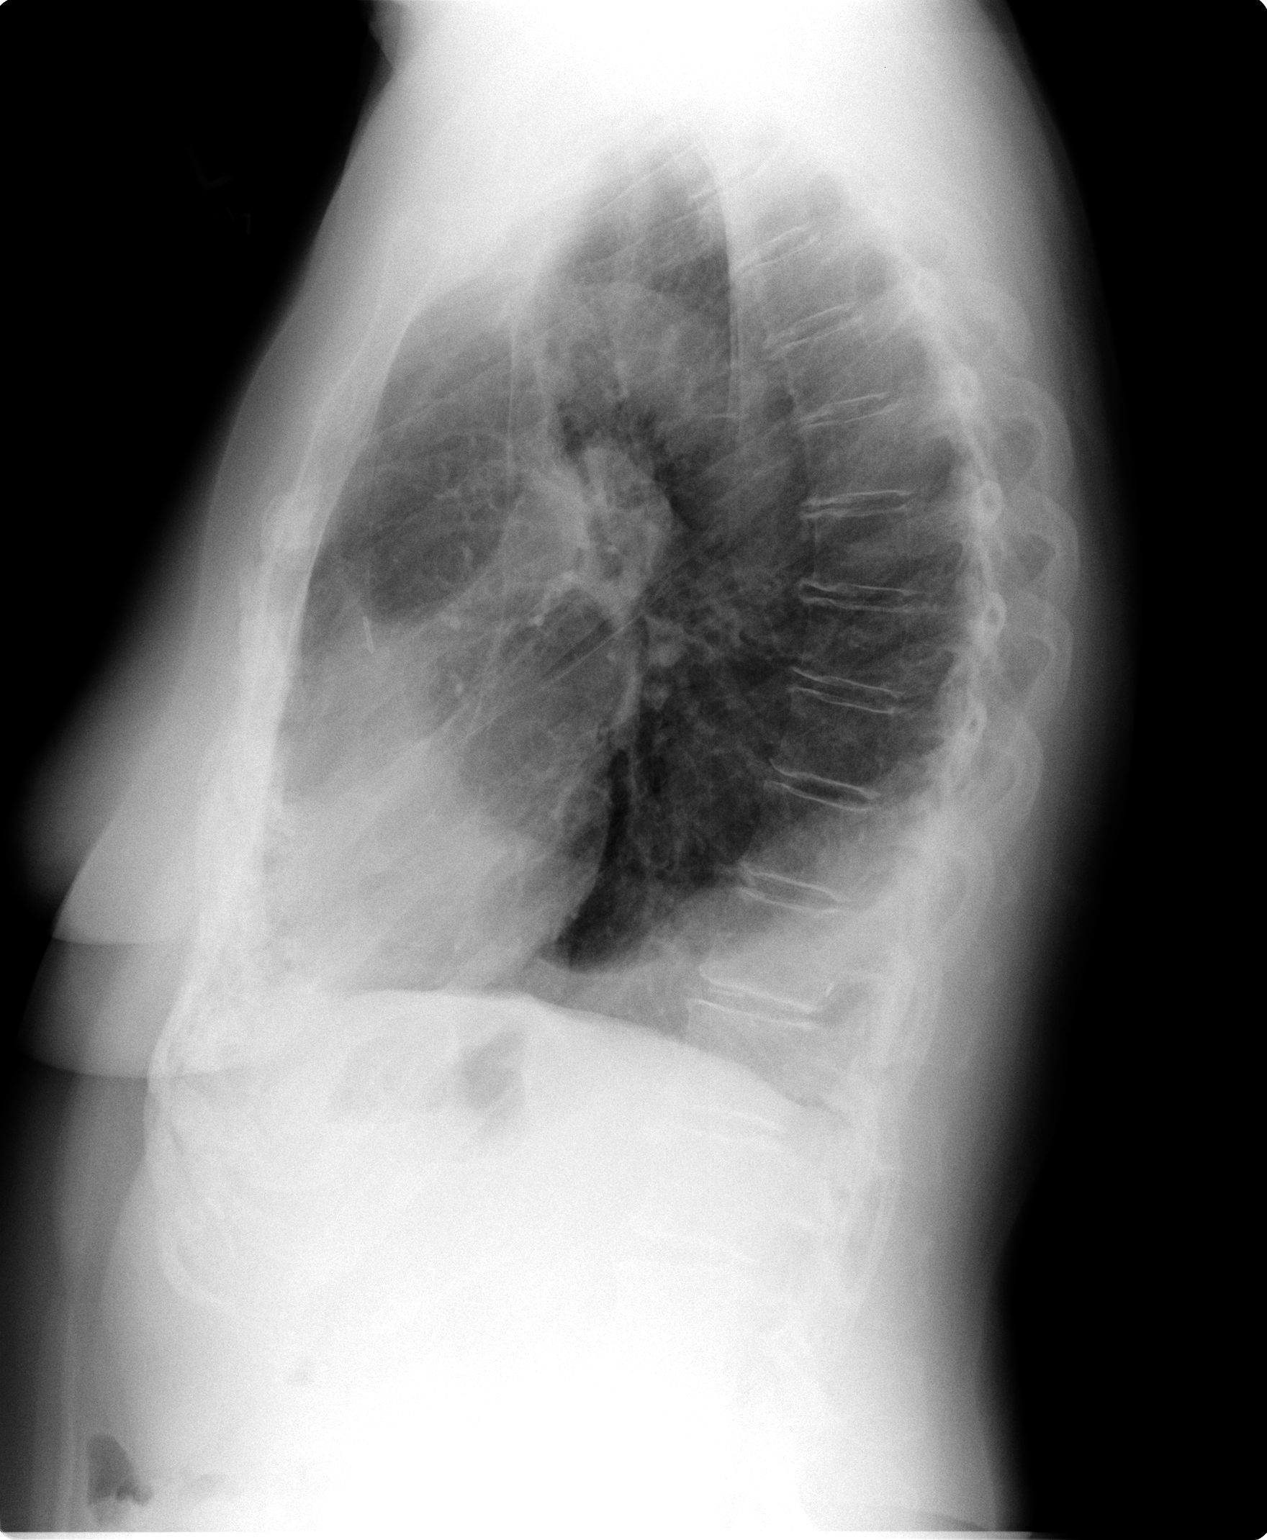

[2 of 2 positions shown; findings below may reference images not displayed]

IMPRESSION: No significant interval change.  Again seen are areas of scarring and density associated with the right lower lung and diffuse peribronchial thickening compatible with a chronic bronchitis and mild COPD.

## 2006-03-27 ENCOUNTER — Ambulatory Visit: Payer: Self-pay | Admitting: Oncology

## 2006-03-29 LAB — CBC WITH DIFFERENTIAL/PLATELET
EOS%: 0.2 % (ref 0.0–7.0)
Eosinophils Absolute: 0 10*3/uL (ref 0.0–0.5)
LYMPH%: 13.8 % — ABNORMAL LOW (ref 14.0–48.0)
MCH: 32.8 pg (ref 26.0–34.0)
MCV: 95.8 fL (ref 81.0–101.0)
MONO%: 9.3 % (ref 0.0–13.0)
NEUT#: 9.1 10*3/uL — ABNORMAL HIGH (ref 1.5–6.5)
Platelets: 262 10*3/uL (ref 145–400)
RBC: 3.96 10*6/uL (ref 3.70–5.32)
RDW: 13.7 % (ref 11.3–14.5)

## 2006-03-29 LAB — COMPREHENSIVE METABOLIC PANEL
AST: 65 U/L — ABNORMAL HIGH (ref 0–37)
Alkaline Phosphatase: 97 U/L (ref 39–117)
BUN: 7 mg/dL (ref 6–23)
Glucose, Bld: 117 mg/dL — ABNORMAL HIGH (ref 70–99)
Potassium: 4.2 mEq/L (ref 3.5–5.3)
Sodium: 133 mEq/L — ABNORMAL LOW (ref 135–145)
Total Bilirubin: 0.7 mg/dL (ref 0.3–1.2)

## 2006-04-05 ENCOUNTER — Encounter: Admission: RE | Admit: 2006-04-05 | Discharge: 2006-04-05 | Payer: Self-pay | Admitting: Thoracic Surgery

## 2006-04-23 IMAGING — CT CT CHEST W/O CM
1 of 2 series · 14 of 30 positions shown, 18 images · IV contrast (agent unspecified)
Comparison: none

CLINICAL DATA: History of lung CA.  Followup.
TECHNIQUE: Multidetector helical scans through the chest were performed in the unenhanced state. 
This scan is compared to a prior CT of the chest of 02/24/04. 
CT CHEST WITHOUT CONTRAST:
The air collection within the anterior right upper hemithorax containing soft tissue internally appears to have resolved in the interval.  Pleural thickening is noted along the right anterior lower hemithorax and posteriorly at the right lung base with only a small fluid collection remaining which appears loculated.  No mediastinal or hilar adenopathy is seen with only a few small pretracheal nodes present.  Changes of COPD again are noted diffusely.

[Series 2: — · axial · 0.70mm/px · z∈[-301,-1]mm · 14 of 70 slices shown, 18 images]
[im 5/70  mediastinal]
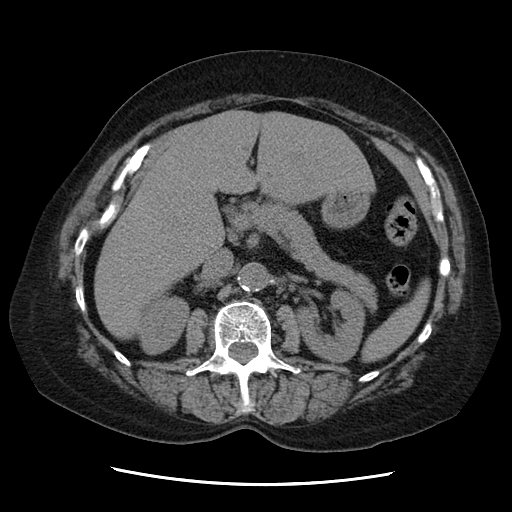
[im 5/70  lung]
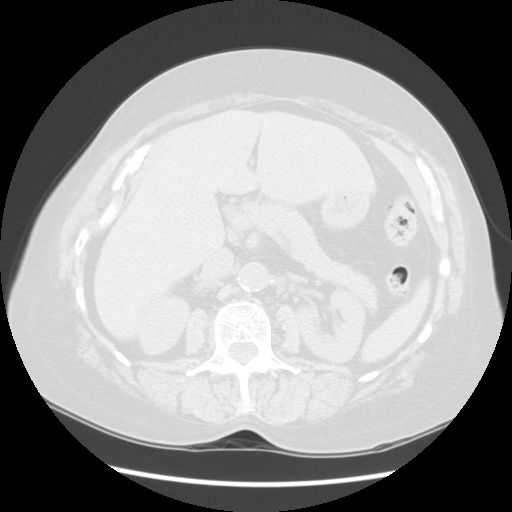
[im 10/70  lung]
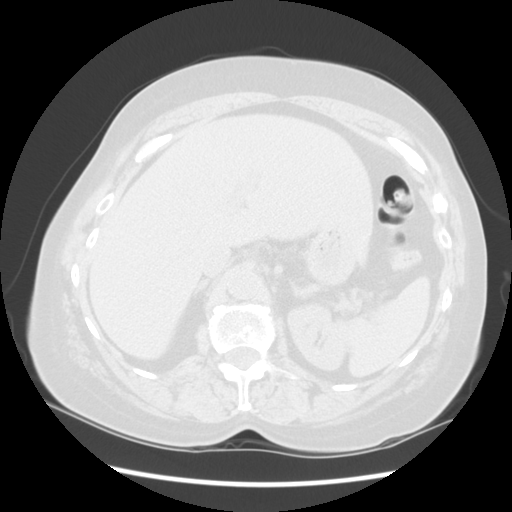
[im 15/70  lung]
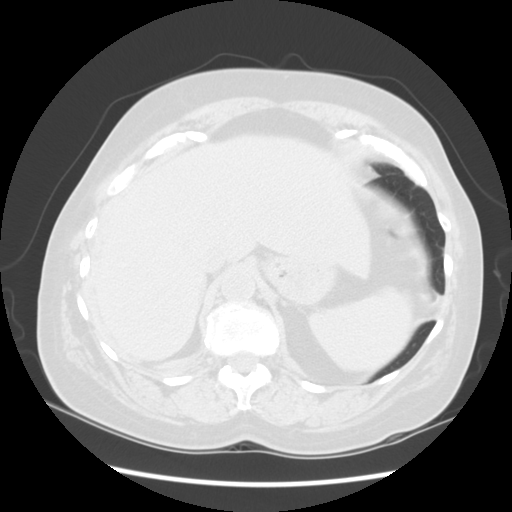
[im 20/70  lung]
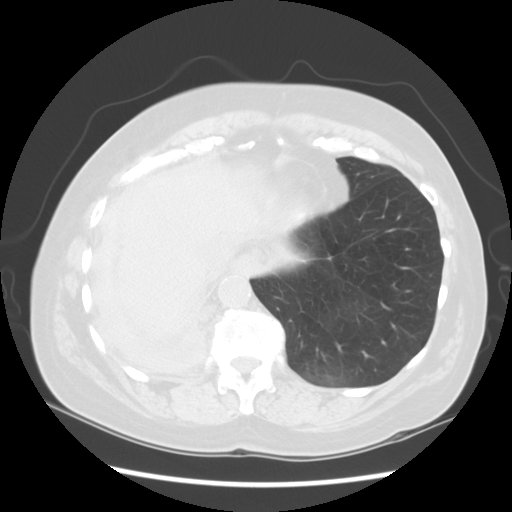
[im 25/70  mediastinal]
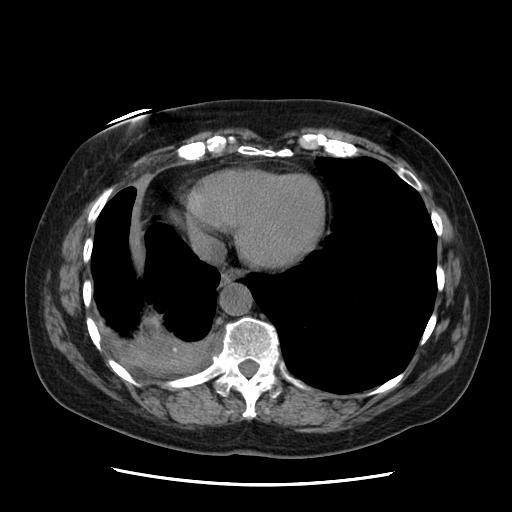
[im 25/70  lung]
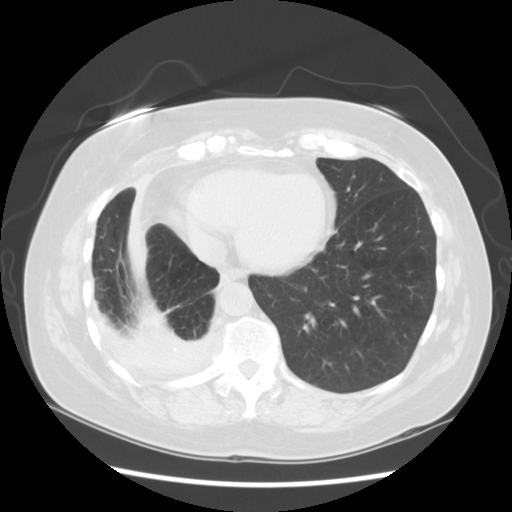
[im 30/70  lung]
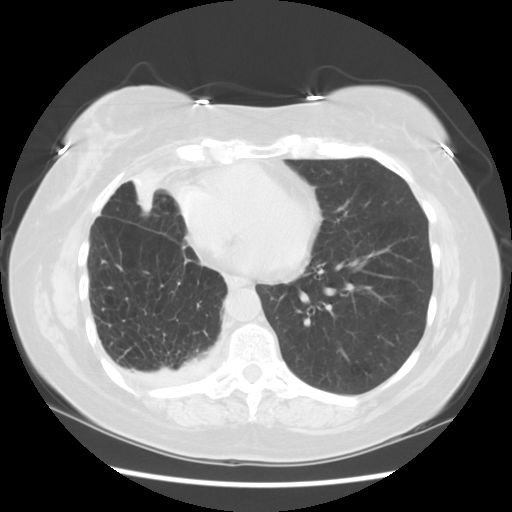
[im 34/70  lung]
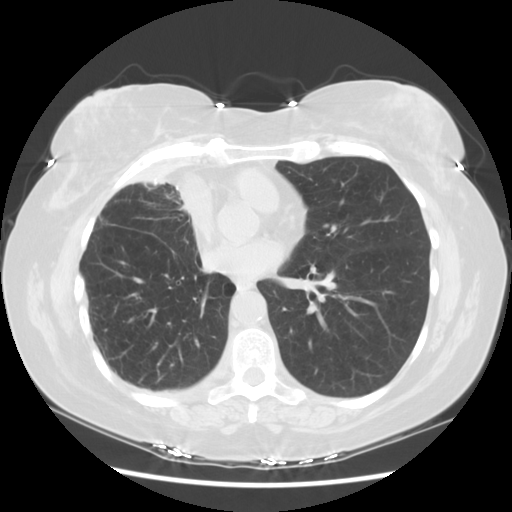
[im 35/70  lung]
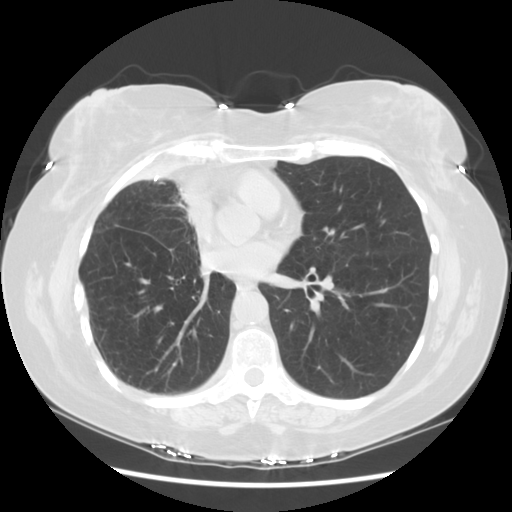
[im 40/70  mediastinal]
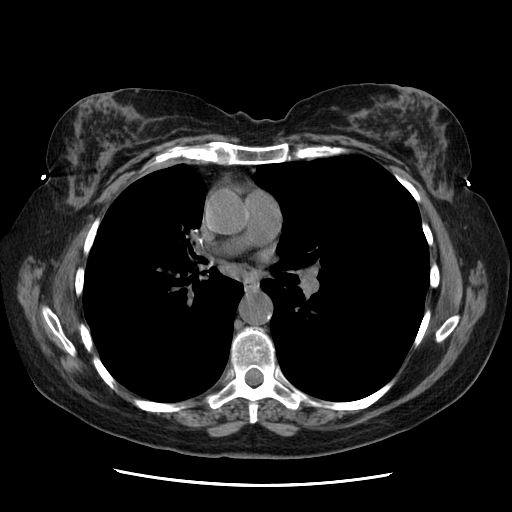
[im 40/70  lung]
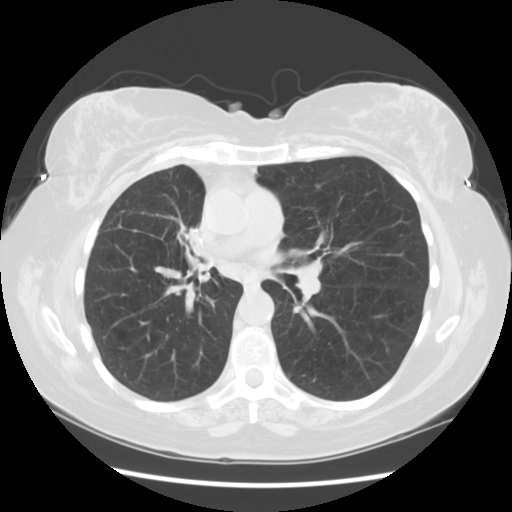
[im 45/70  lung]
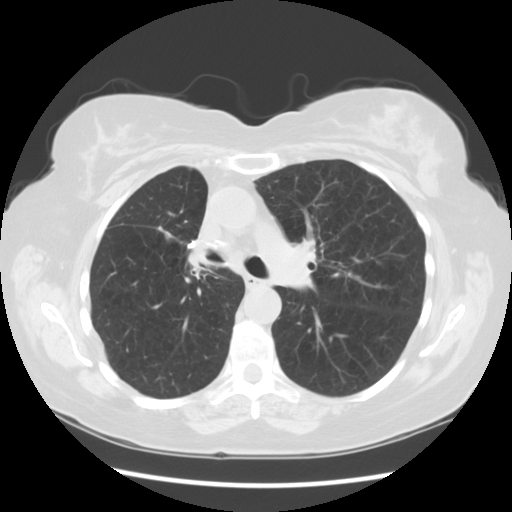
[im 50/70  lung]
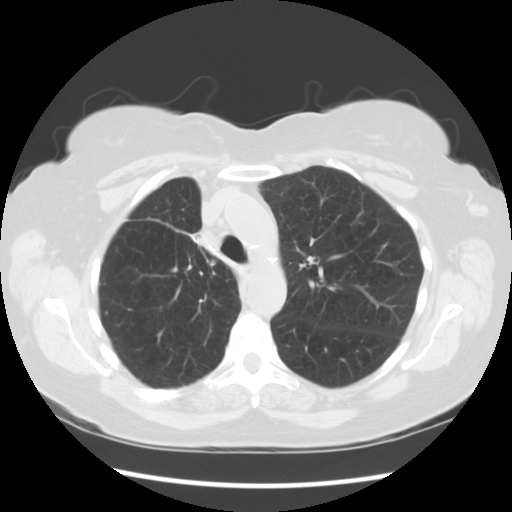
[im 55/70  lung]
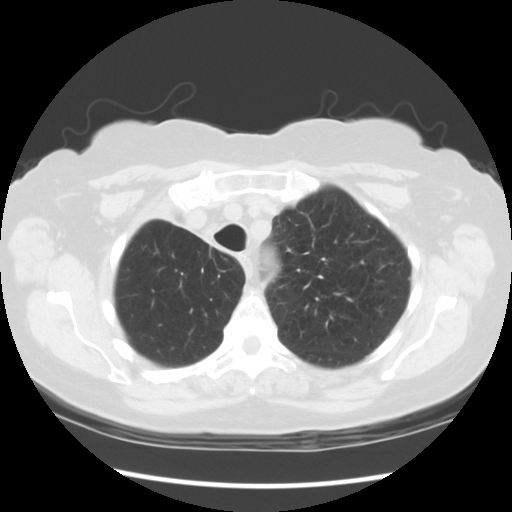
[im 60/70  mediastinal]
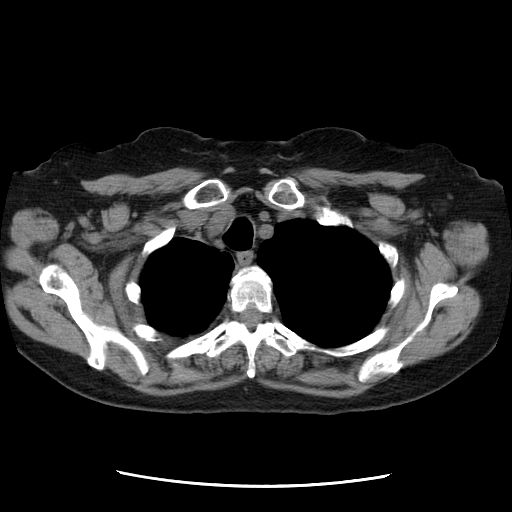
[im 60/70  lung]
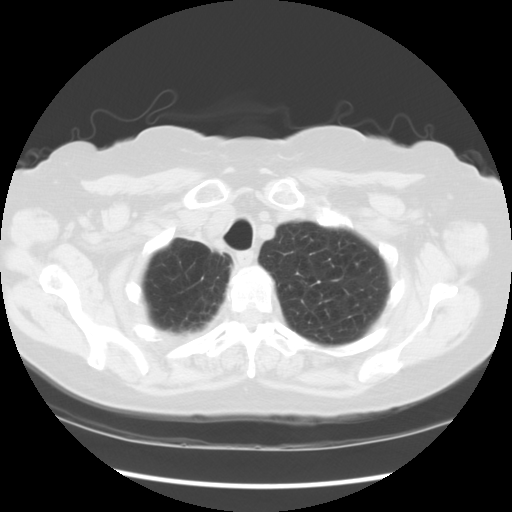
[im 65/70  lung]
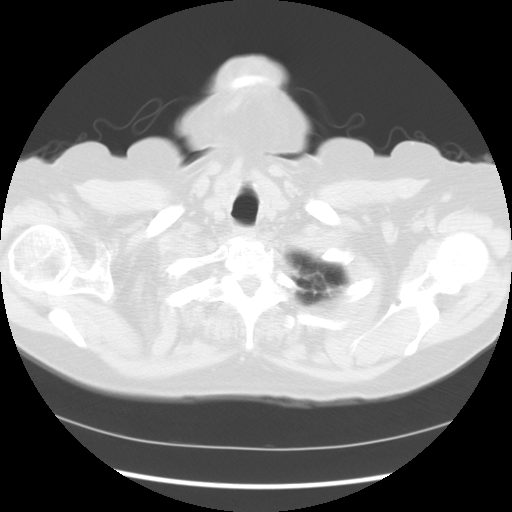

[14 of 30 positions shown; findings below may reference images not displayed]

IMPRESSION: Resolution of air collection in the right upper anterior hemithorax which contained soft tissue density on CT of 03/05/04.  Pleural scarring is noted on the right with only a small loculated effusion at the right lung base.  Left lung is clear.

## 2006-05-26 ENCOUNTER — Encounter: Admission: RE | Admit: 2006-05-26 | Discharge: 2006-05-26 | Payer: Self-pay | Admitting: Gastroenterology

## 2006-06-19 IMAGING — CR DG CHEST 2V
2 series · 2 of 2 positions shown · non-contrast
Comparison: CT chest 09/28/04.

CLINICAL DATA: Lung cancer.  
 2-VIEW CHEST RADIOGRAPH:

[view not recorded (1 of 2)]
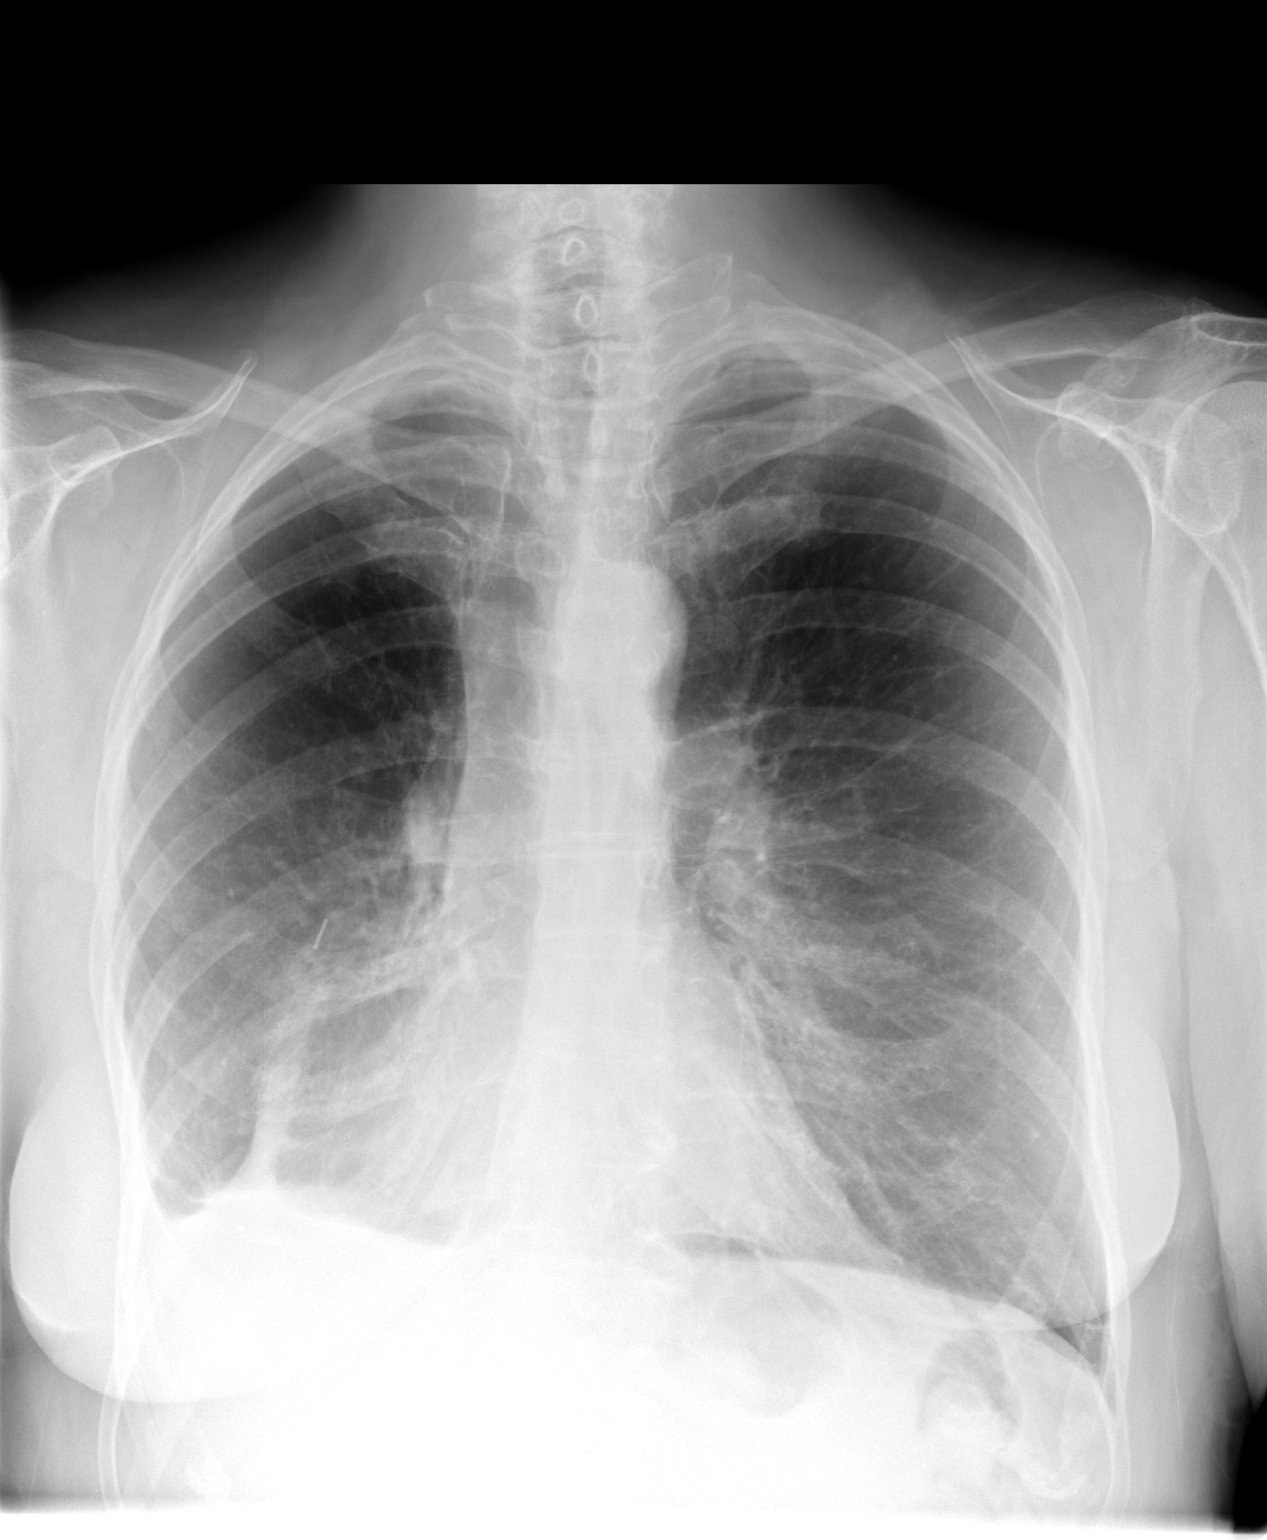

[view not recorded (2 of 2)]
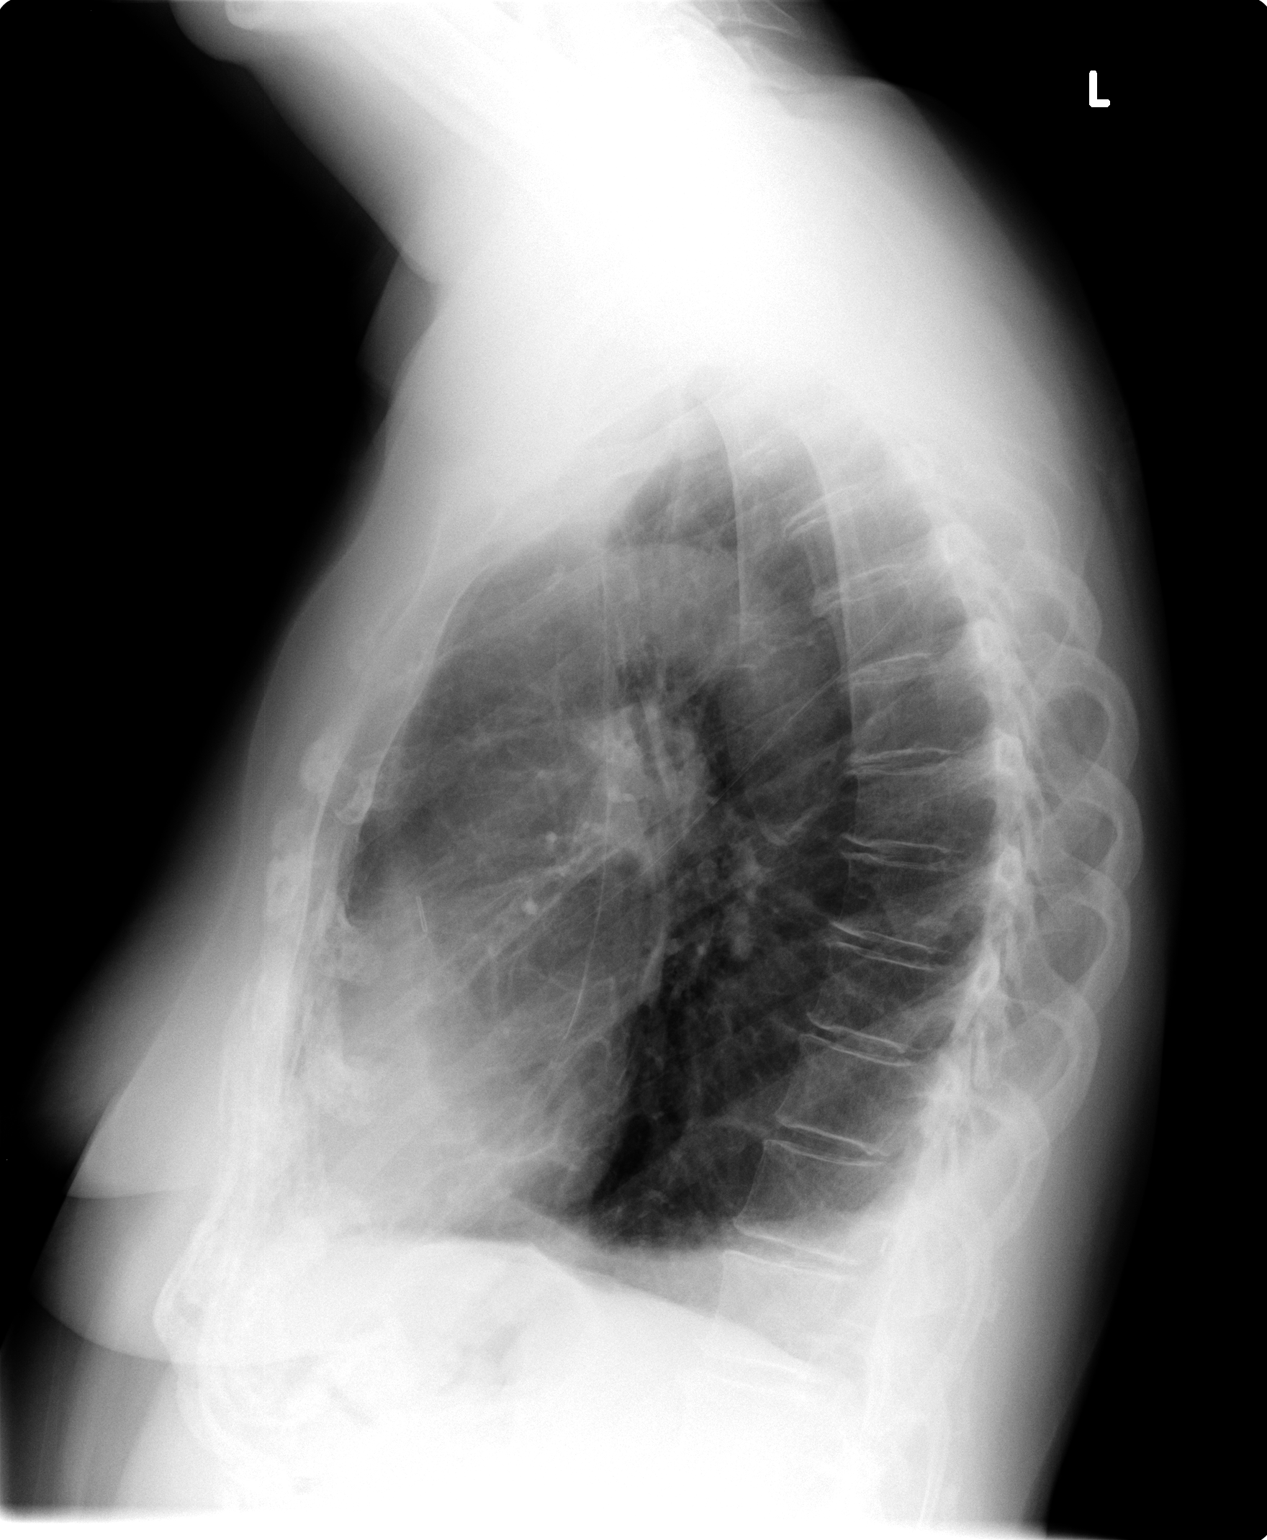

[2 of 2 positions shown; findings below may reference images not displayed]

FINDINGS: Prior wedge resection noted.  There is persistent scarring at the right lung base along with stable pleural thickening at the right lung base.  Underlying emphysema is present.  There is also underlying chronic interstitial opacity.
IMPRESSION: Stable right basilar scarring compared to the prior CT of 09/28/04.

## 2006-07-27 ENCOUNTER — Ambulatory Visit: Payer: Self-pay | Admitting: Oncology

## 2006-08-01 LAB — CBC WITH DIFFERENTIAL/PLATELET
BASO%: 0.3 % (ref 0.0–2.0)
Basophils Absolute: 0 10*3/uL (ref 0.0–0.1)
EOS%: 1.7 % (ref 0.0–7.0)
HGB: 13.3 g/dL (ref 11.6–15.9)
MCH: 33.7 pg (ref 26.0–34.0)
MCHC: 35.4 g/dL (ref 32.0–36.0)
RDW: 12.8 % (ref 11.3–14.5)
lymph#: 1.7 10*3/uL (ref 0.9–3.3)

## 2006-08-01 LAB — COMPREHENSIVE METABOLIC PANEL
ALT: 28 U/L (ref 0–35)
AST: 48 U/L — ABNORMAL HIGH (ref 0–37)
Albumin: 3.9 g/dL (ref 3.5–5.2)
Calcium: 9.8 mg/dL (ref 8.4–10.5)
Chloride: 98 mEq/L (ref 96–112)
Potassium: 4.3 mEq/L (ref 3.5–5.3)
Sodium: 136 mEq/L (ref 135–145)
Total Protein: 7.3 g/dL (ref 6.0–8.3)

## 2006-08-02 ENCOUNTER — Ambulatory Visit (HOSPITAL_COMMUNITY): Admission: RE | Admit: 2006-08-02 | Discharge: 2006-08-02 | Payer: Self-pay | Admitting: Oncology

## 2006-08-30 ENCOUNTER — Ambulatory Visit: Payer: Self-pay | Admitting: Thoracic Surgery

## 2006-10-23 IMAGING — CR DG CHEST 2V
2 series · 2 of 2 positions shown · non-contrast
Comparison: 11/24/04.

CLINICAL DATA: History of lung cancer. 
 CHEST, TWO VIEWS:

[view not recorded (1 of 2)]
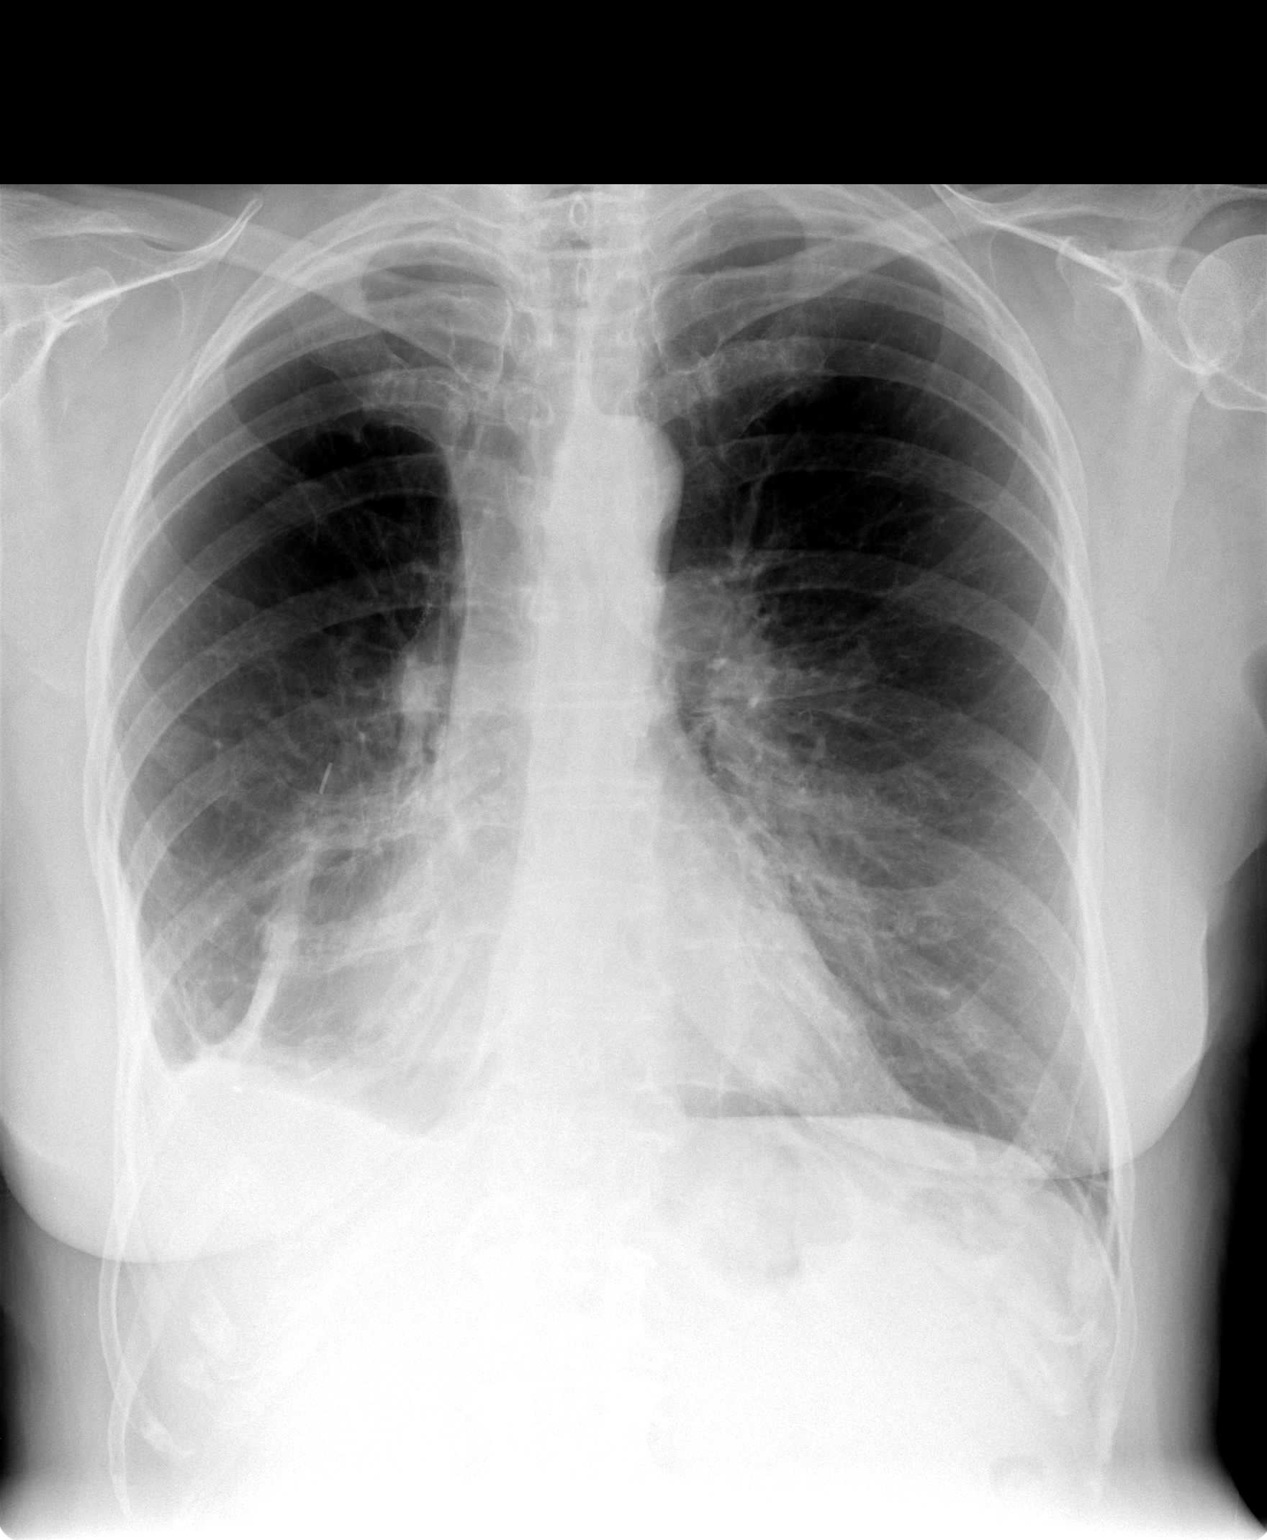

[view not recorded (2 of 2)]
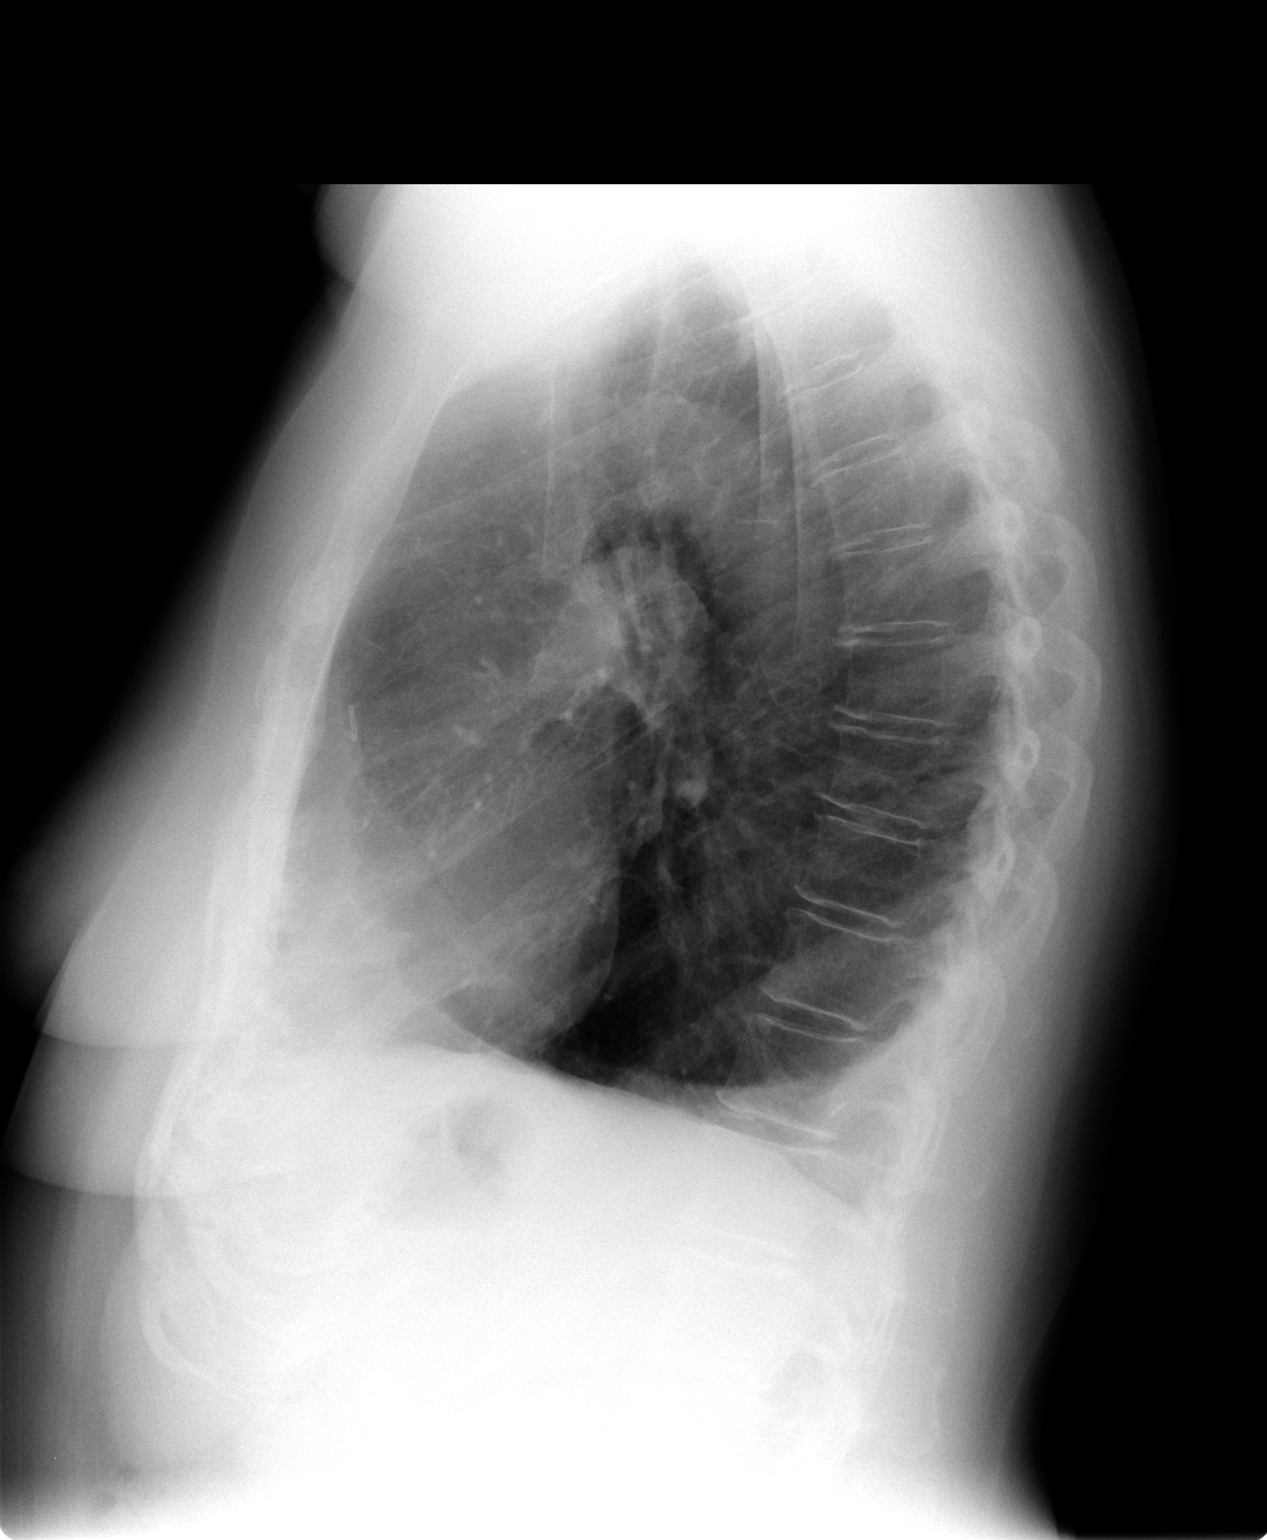

[2 of 2 positions shown; findings below may reference images not displayed]

Post operative changes on the right with scarring in the right lung base similar to the prior study.  There is COPD and hyperinflation.  There is no recurrent mass or heart failure.
IMPRESSION: Stable exam without evidence of recurrent tumor.

## 2006-10-25 IMAGING — CT CT CHEST W/O CM
1 series · 16 of 33 positions shown, 20 images · IV contrast (agent unspecified)
Comparison: 09/28/04.

CLINICAL DATA: Lung cancer status post right partial lobectomy.   Now with some shortness of breath.  
CT CHEST W/O CONTRAST:
TECHNIQUE: Multidetector CT imaging of the chest was performed following the standard protocol without IV contrast. 
No pathologically enlarged axillary lymphadenopathy.  
No pathologically enlarged right paratracheal, precarinal, prevascular, AP window, or hilar lymphadenopathy. 
Post surgical changes and pleural thickening again noted at the right lower lung zone.  
There are marked emphysematous changes affecting both lungs. 
Tiny 1mm nodule within the left lower lobe (image 38), stable. 
Tiny right lower lobe nodules are also unchanged (image 24).

[Series 2: — · axial · 0.70mm/px · z∈[-324,+11]mm · 16 of 73 slices shown, 20 images]
[im 3/73  mediastinal]
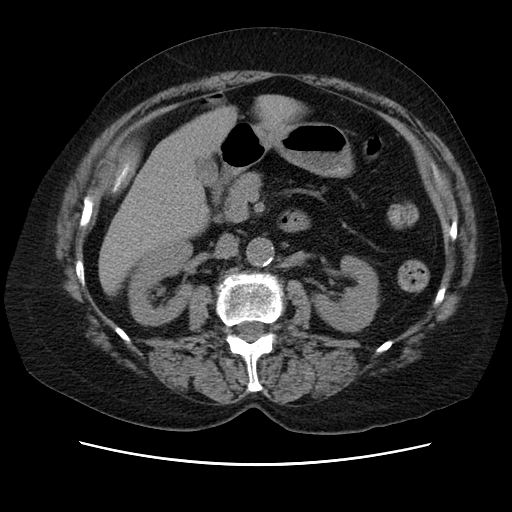
[im 3/73  lung]
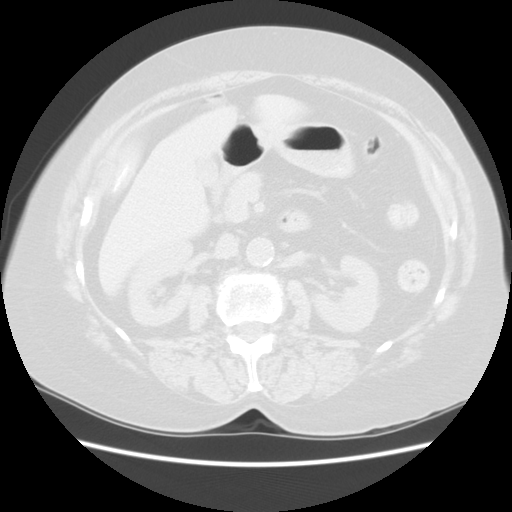
[im 9/73  lung]
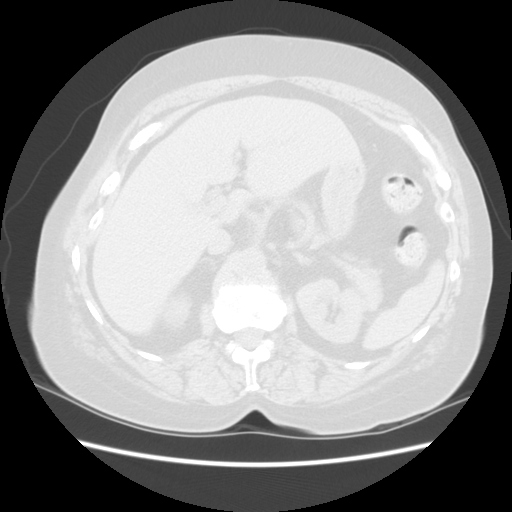
[im 14/73  lung]
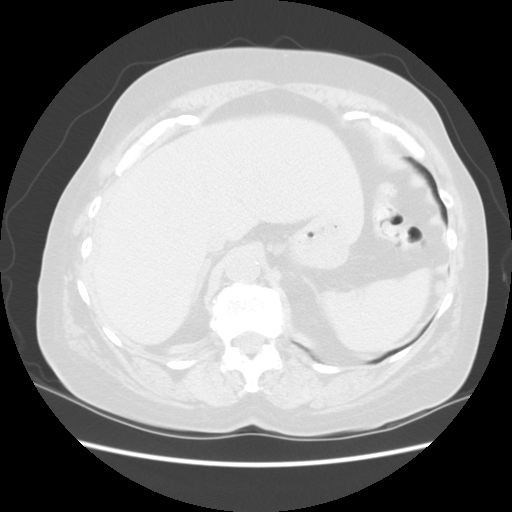
[im 17/73  lung]
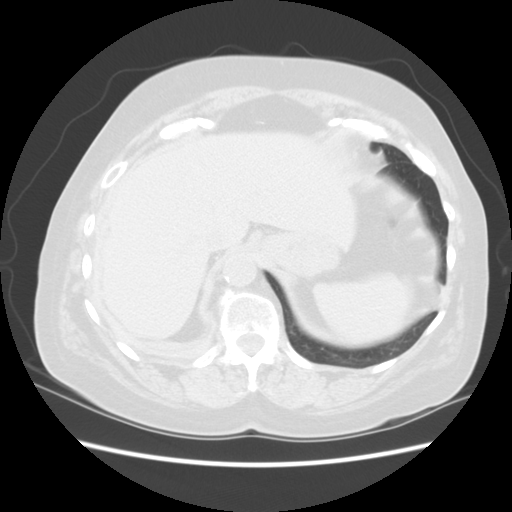
[im 22/73  mediastinal]
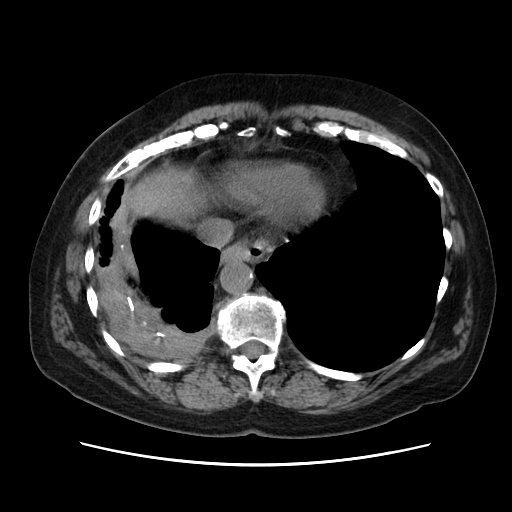
[im 22/73  lung]
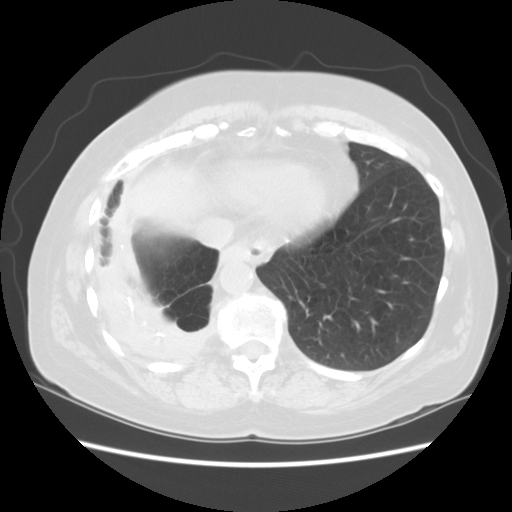
[im 27/73  lung]
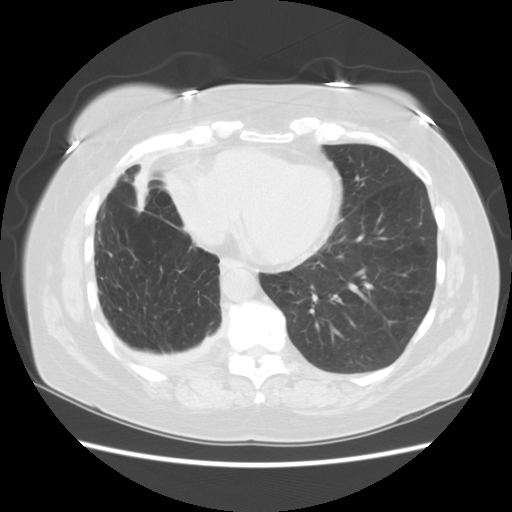
[im 30/73  lung]
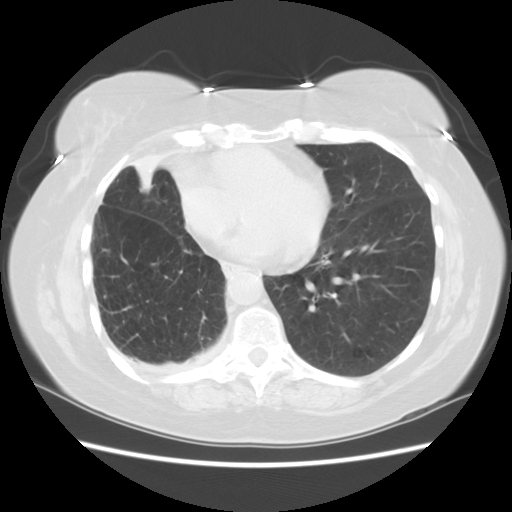
[im 35/73  lung]
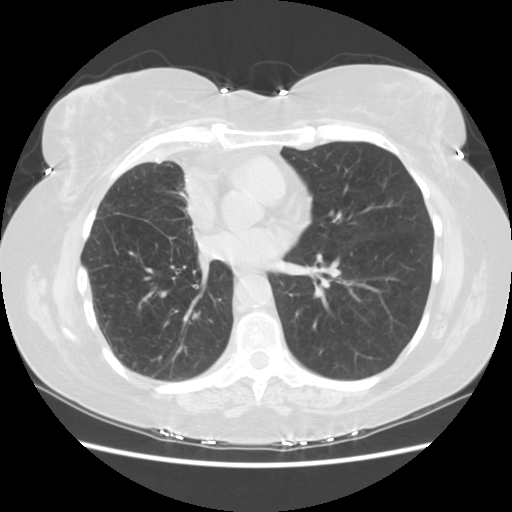
[im 39/73  mediastinal]
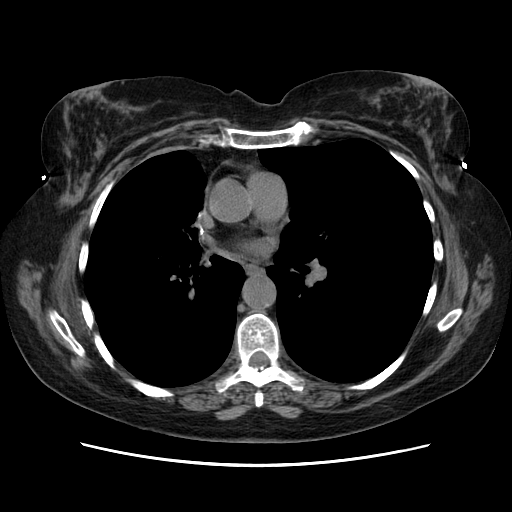
[im 39/73  lung]
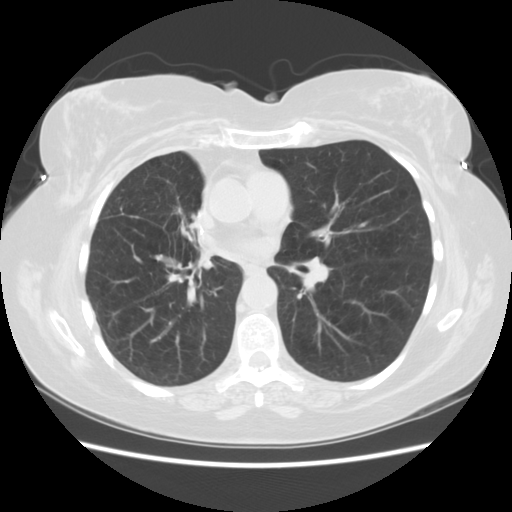
[im 43/73  lung]
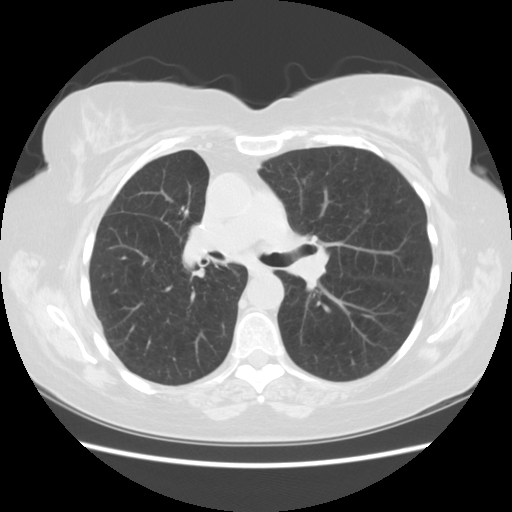
[im 46/73  lung]
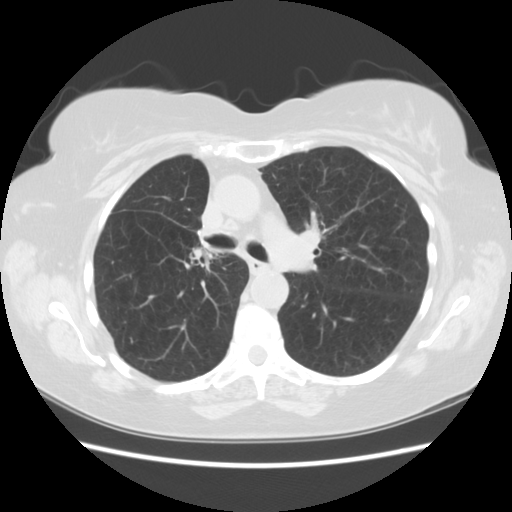
[im 51/73  lung]
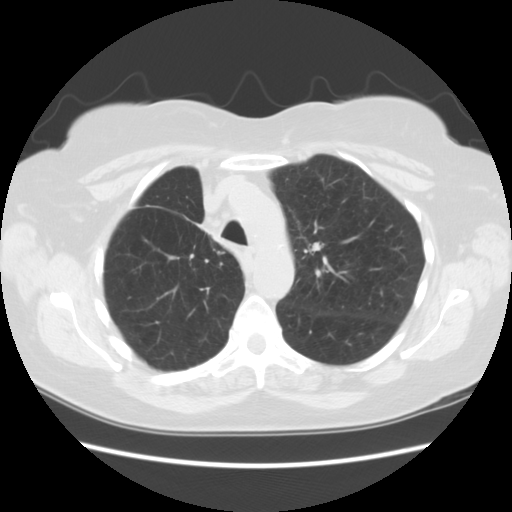
[im 57/73  mediastinal]
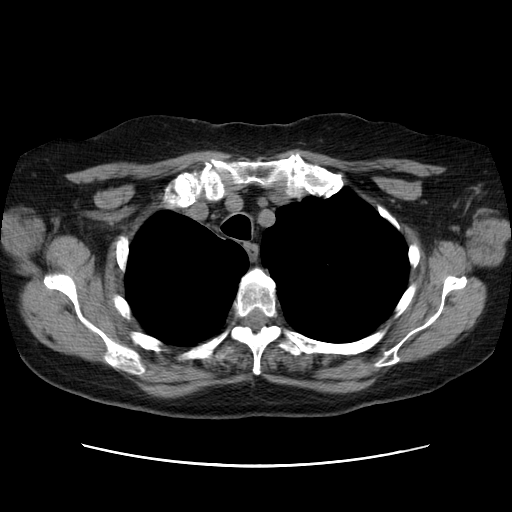
[im 57/73  lung]
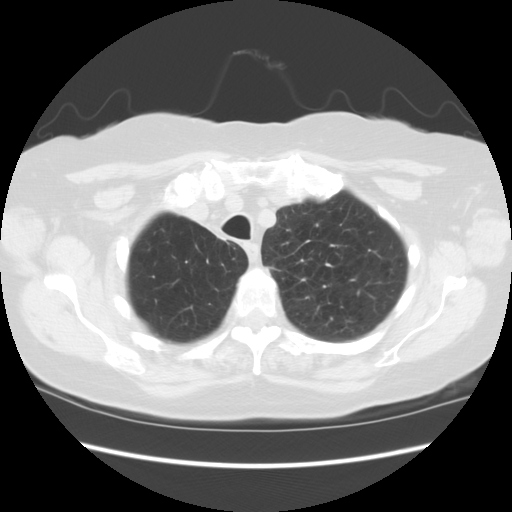
[im 59/73  lung]
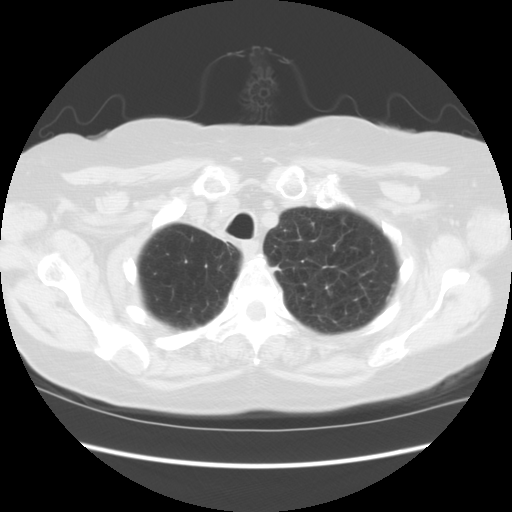
[im 65/73  lung]
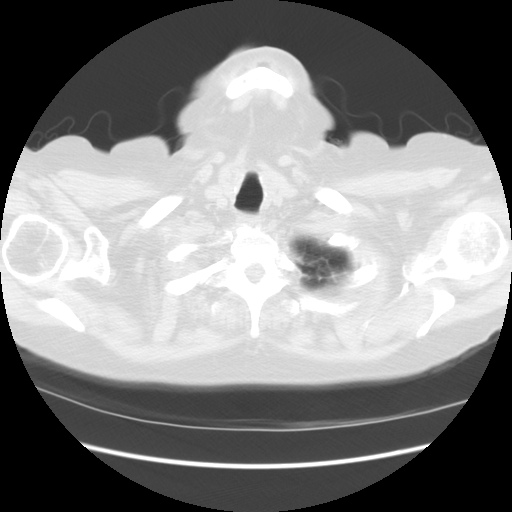
[im 70/73  lung]
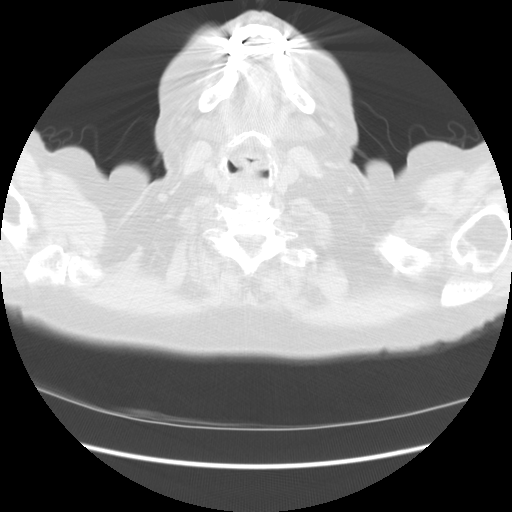

[16 of 33 positions shown; findings below may reference images not displayed]

IMPRESSION: Stable exam.

## 2006-11-23 ENCOUNTER — Ambulatory Visit: Payer: Self-pay | Admitting: Oncology

## 2006-11-28 ENCOUNTER — Ambulatory Visit (HOSPITAL_COMMUNITY): Admission: RE | Admit: 2006-11-28 | Discharge: 2006-11-28 | Payer: Self-pay | Admitting: Oncology

## 2006-11-28 LAB — COMPREHENSIVE METABOLIC PANEL
CO2: 31 mEq/L (ref 19–32)
Calcium: 8.8 mg/dL (ref 8.4–10.5)
Chloride: 97 mEq/L (ref 96–112)
Creatinine, Ser: 0.88 mg/dL (ref 0.40–1.20)
Glucose, Bld: 108 mg/dL — ABNORMAL HIGH (ref 70–99)
Sodium: 134 mEq/L — ABNORMAL LOW (ref 135–145)
Total Bilirubin: 0.8 mg/dL (ref 0.3–1.2)
Total Protein: 7.6 g/dL (ref 6.0–8.3)

## 2006-11-28 LAB — CBC WITH DIFFERENTIAL/PLATELET
Basophils Absolute: 0 10*3/uL (ref 0.0–0.1)
EOS%: 1.8 % (ref 0.0–7.0)
LYMPH%: 24.6 % (ref 14.0–48.0)
MCH: 33.2 pg (ref 26.0–34.0)
MCV: 94.8 fL (ref 81.0–101.0)
MONO%: 13.5 % — ABNORMAL HIGH (ref 0.0–13.0)
RBC: 4.01 10*6/uL (ref 3.70–5.32)
RDW: 12.8 % (ref 11.3–14.5)

## 2006-11-28 LAB — LACTATE DEHYDROGENASE: LDH: 147 U/L (ref 94–250)

## 2006-12-20 ENCOUNTER — Ambulatory Visit: Payer: Self-pay | Admitting: Thoracic Surgery

## 2007-02-02 ENCOUNTER — Encounter: Admission: RE | Admit: 2007-02-02 | Discharge: 2007-02-02 | Payer: Self-pay | Admitting: Internal Medicine

## 2007-02-07 ENCOUNTER — Encounter: Admission: RE | Admit: 2007-02-07 | Discharge: 2007-02-07 | Payer: Self-pay | Admitting: Internal Medicine

## 2007-04-23 IMAGING — CR DG CHEST 2V
2 series · 2 of 2 positions shown · non-contrast
Comparison: CT chest 04/01/05 and chest x-ray 03/30/05.

CLINICAL DATA: Post-op lung lesion.  
 TWO VIEW CHEST:

[w chest pa]
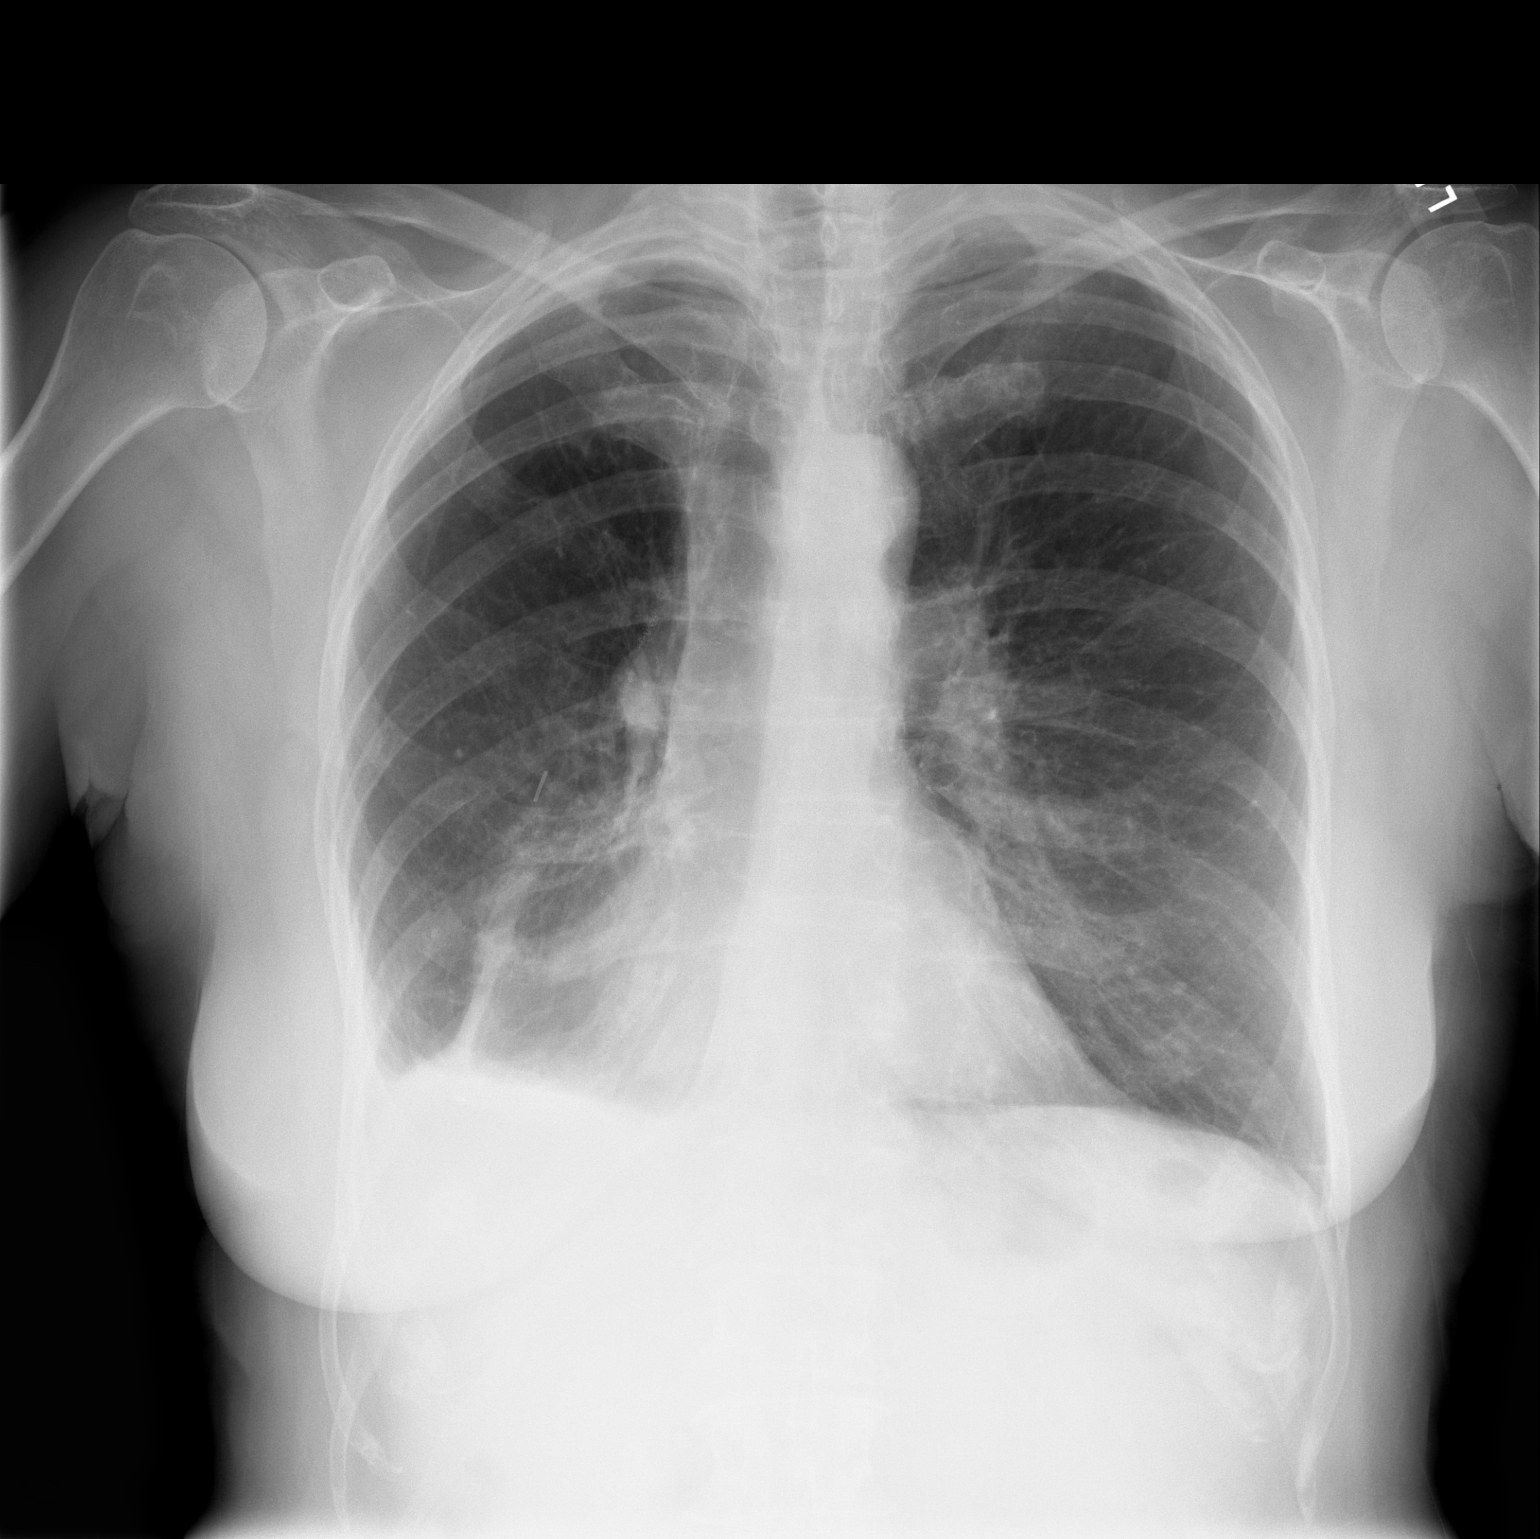

[w chest lat]
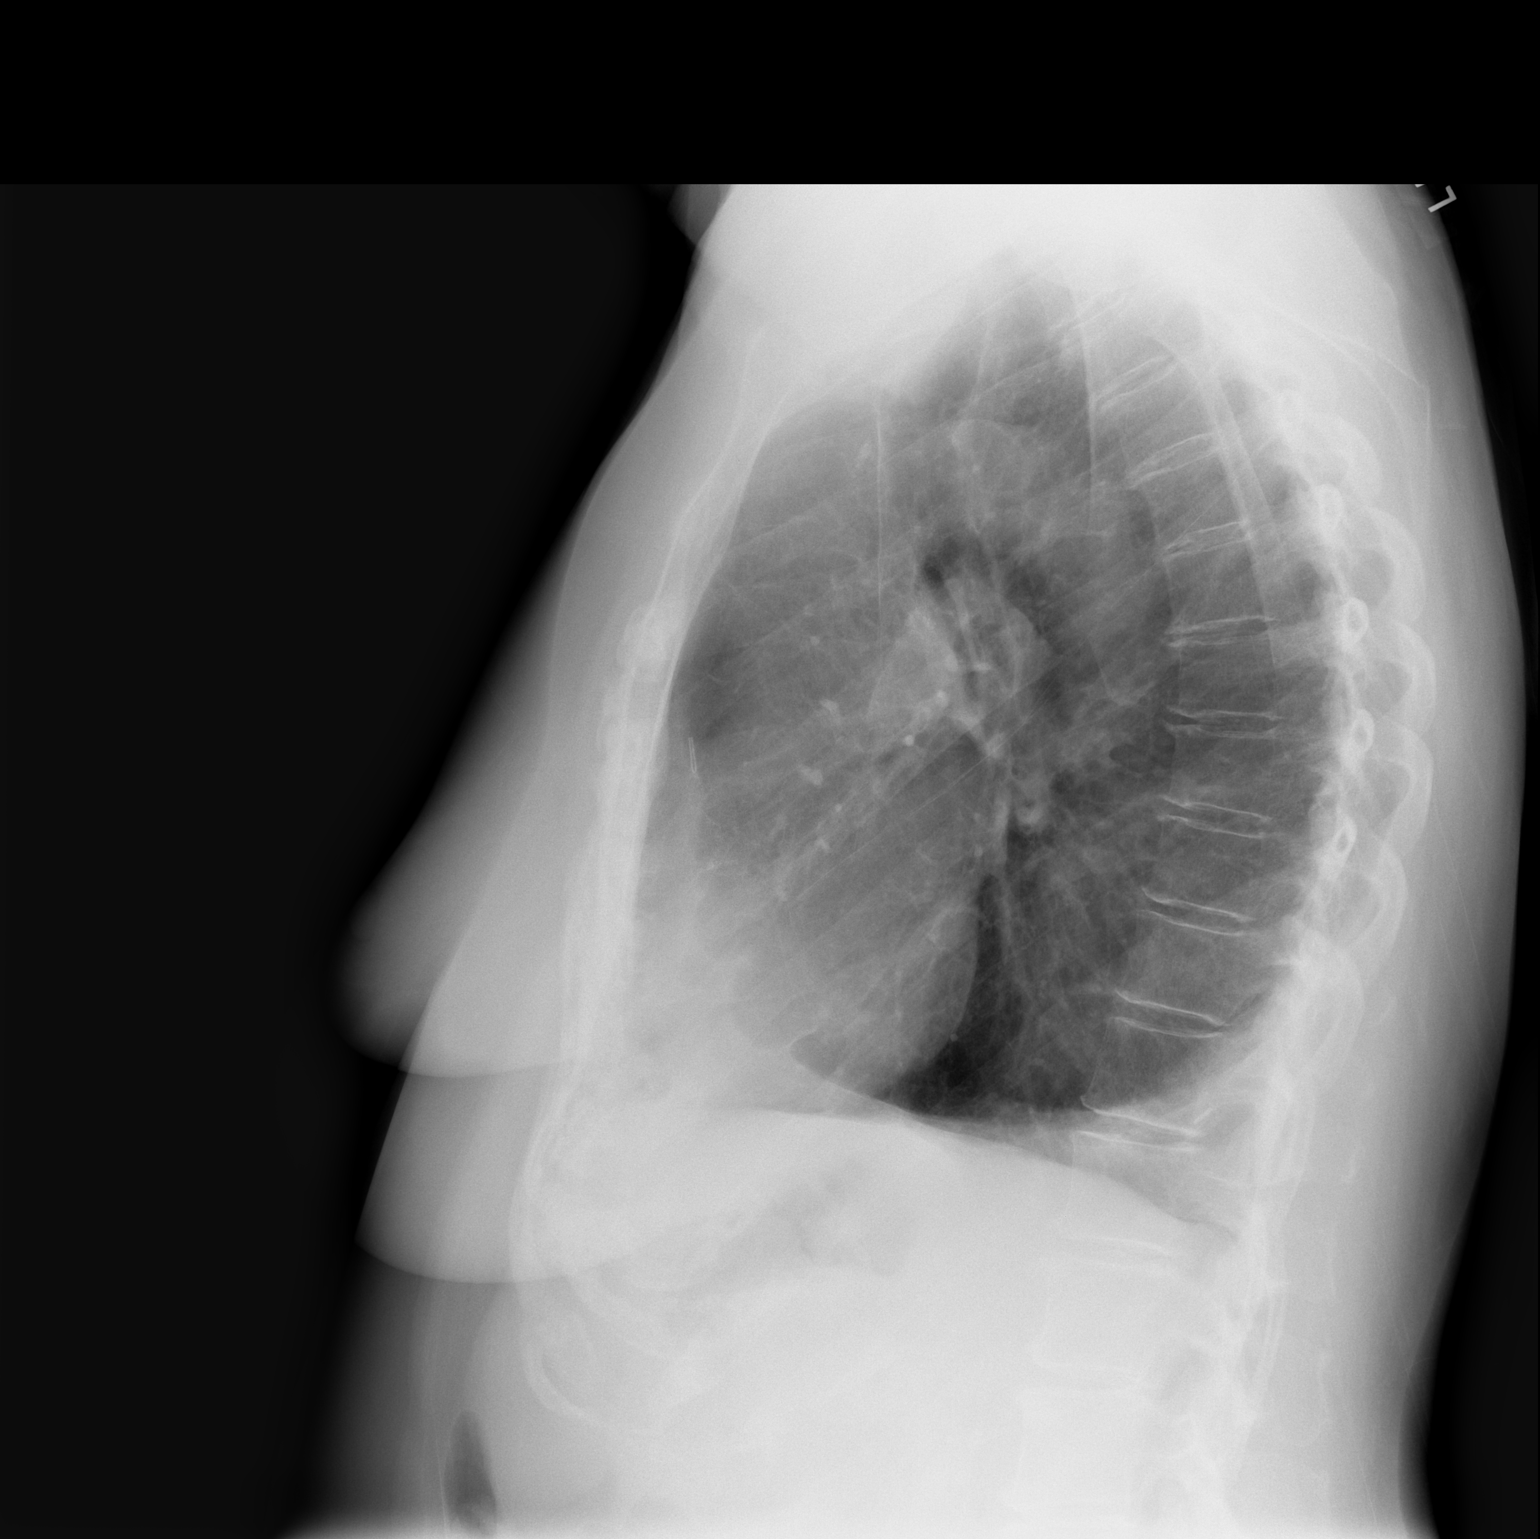

[2 of 2 positions shown; findings below may reference images not displayed]

FINDINGS: Post operative changes and volume loss are again seen in the right hemithorax.  Left lung is clear.
IMPRESSION: Post operative changes in the right hemithorax without acute finding.

## 2007-05-24 ENCOUNTER — Ambulatory Visit: Payer: Self-pay | Admitting: Oncology

## 2007-05-28 ENCOUNTER — Ambulatory Visit (HOSPITAL_COMMUNITY): Admission: RE | Admit: 2007-05-28 | Discharge: 2007-05-28 | Payer: Self-pay | Admitting: Oncology

## 2007-05-28 LAB — COMPREHENSIVE METABOLIC PANEL
ALT: 20 U/L (ref 0–35)
AST: 50 U/L — ABNORMAL HIGH (ref 0–37)
Albumin: 3.2 g/dL — ABNORMAL LOW (ref 3.5–5.2)
CO2: 31 mEq/L (ref 19–32)
Calcium: 8.7 mg/dL (ref 8.4–10.5)
Chloride: 101 mEq/L (ref 96–112)
Creatinine, Ser: 0.86 mg/dL (ref 0.40–1.20)
Potassium: 4.1 mEq/L (ref 3.5–5.3)
Sodium: 136 mEq/L (ref 135–145)
Total Protein: 7.1 g/dL (ref 6.0–8.3)

## 2007-05-28 LAB — CBC WITH DIFFERENTIAL/PLATELET
BASO%: 0.5 % (ref 0.0–2.0)
EOS%: 3.1 % (ref 0.0–7.0)
HCT: 38 % (ref 34.8–46.6)
MCHC: 35.3 g/dL (ref 32.0–36.0)
MONO#: 0.4 10*3/uL (ref 0.1–0.9)
NEUT%: 57.1 % (ref 39.6–76.8)
RDW: 13.2 % (ref 11.3–14.5)
WBC: 4.6 10*3/uL (ref 3.9–10.0)
lymph#: 1.4 10*3/uL (ref 0.9–3.3)

## 2007-05-28 LAB — LACTATE DEHYDROGENASE: LDH: 229 U/L (ref 94–250)

## 2007-09-07 ENCOUNTER — Ambulatory Visit: Payer: Self-pay | Admitting: Oncology

## 2007-09-11 ENCOUNTER — Ambulatory Visit (HOSPITAL_COMMUNITY): Admission: RE | Admit: 2007-09-11 | Discharge: 2007-09-11 | Payer: Self-pay | Admitting: Oncology

## 2007-09-11 ENCOUNTER — Encounter: Payer: Self-pay | Admitting: Internal Medicine

## 2007-09-18 ENCOUNTER — Ambulatory Visit (HOSPITAL_COMMUNITY): Admission: RE | Admit: 2007-09-18 | Discharge: 2007-09-18 | Payer: Self-pay | Admitting: Oncology

## 2007-09-18 ENCOUNTER — Encounter: Payer: Self-pay | Admitting: Internal Medicine

## 2007-09-25 ENCOUNTER — Encounter: Payer: Self-pay | Admitting: Internal Medicine

## 2007-10-10 ENCOUNTER — Ambulatory Visit: Payer: Self-pay | Admitting: Internal Medicine

## 2007-10-10 DIAGNOSIS — J438 Other emphysema: Secondary | ICD-10-CM | POA: Insufficient documentation

## 2007-10-10 DIAGNOSIS — C3410 Malignant neoplasm of upper lobe, unspecified bronchus or lung: Secondary | ICD-10-CM | POA: Insufficient documentation

## 2007-10-10 DIAGNOSIS — J302 Other seasonal allergic rhinitis: Secondary | ICD-10-CM

## 2007-10-29 IMAGING — CT CT CHEST W/O CM
2 of 3 series · 15 of 36 positions shown, 18 images · IV contrast (agent unspecified)
Comparison: 04/01/05.

CLINICAL DATA: Follow up right lung CA.  Lobectomy 2993. 
CT CHEST WITHOUT CONTRAST:
TECHNIQUE: Multidetector CT imaging of the chest was performed following the standard protocol without IV contrast.

[Series 3: routine chest · axial · 0.64mm/px · z∈[-269,+11]mm · 12 of 66 slices shown, 15 images]
[im 5/66  mediastinal]
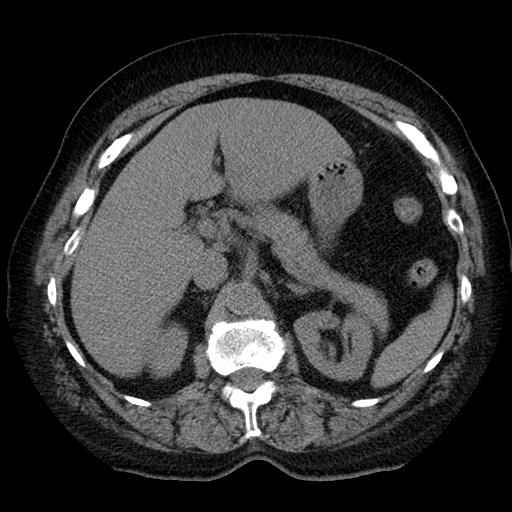
[im 5/66  lung]
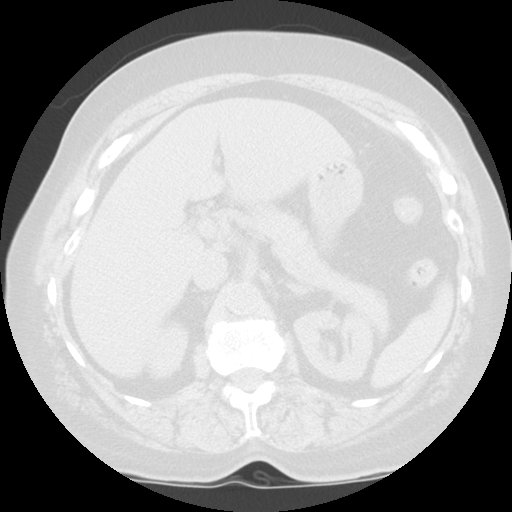
[im 10/66  lung]
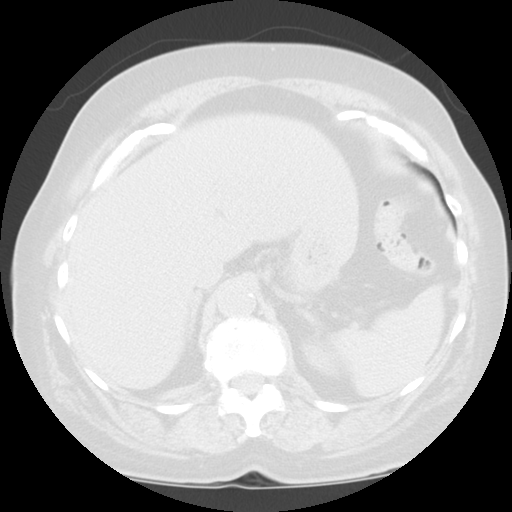
[im 15/66  lung]
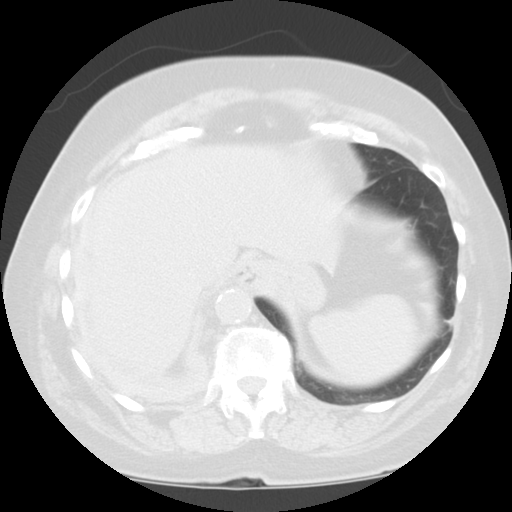
[im 20/66  lung]
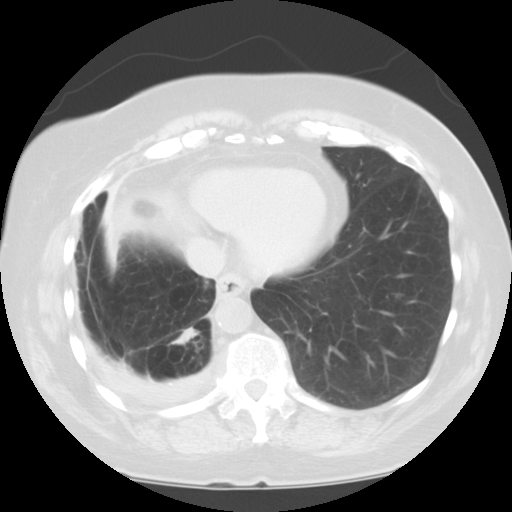
[im 25/66  mediastinal]
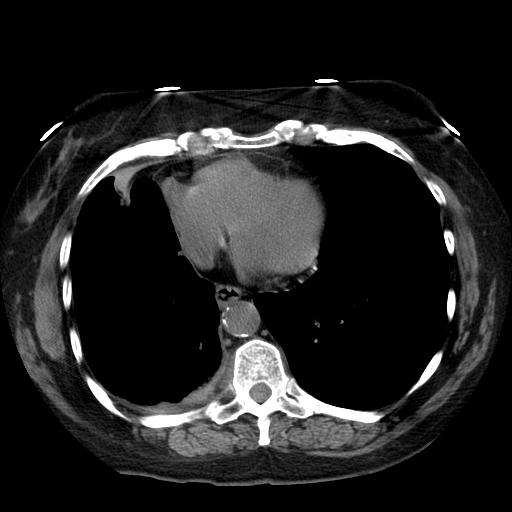
[im 25/66  lung]
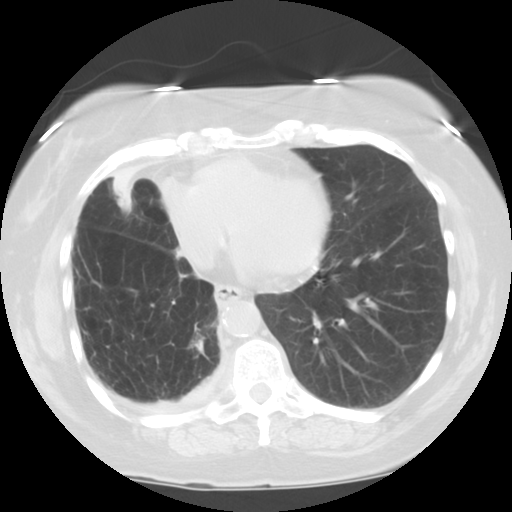
[im 29/66  lung]
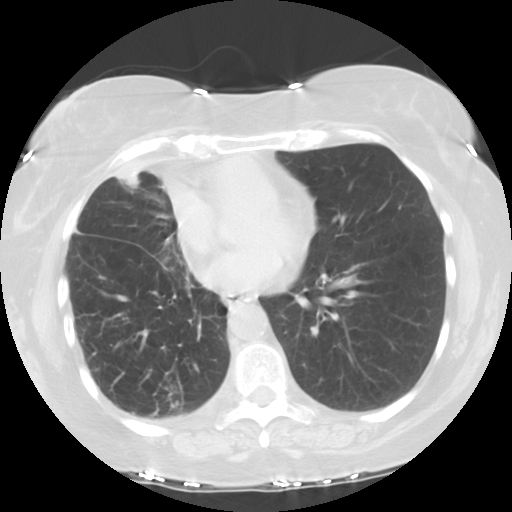
[im 37/66  lung]
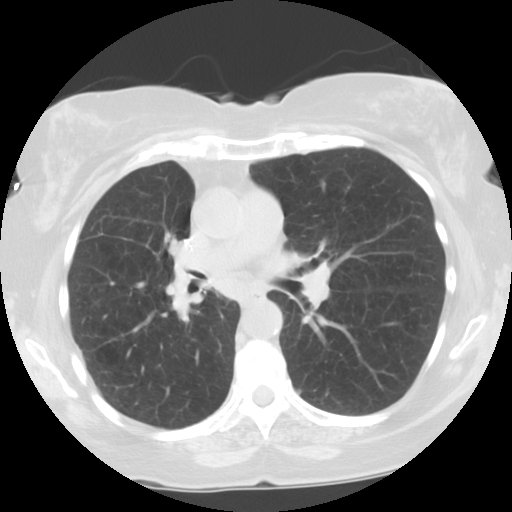
[im 41/66  lung]
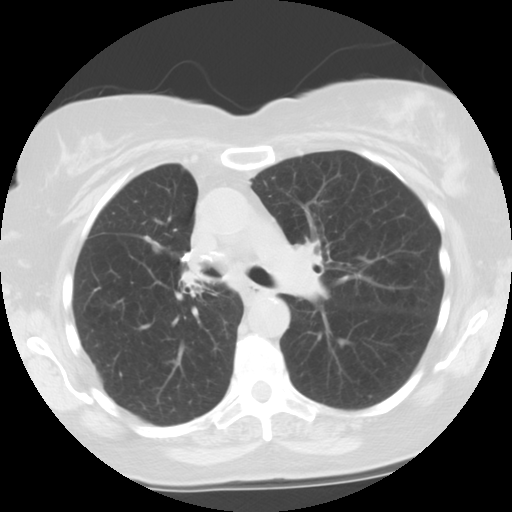
[im 46/66  mediastinal]
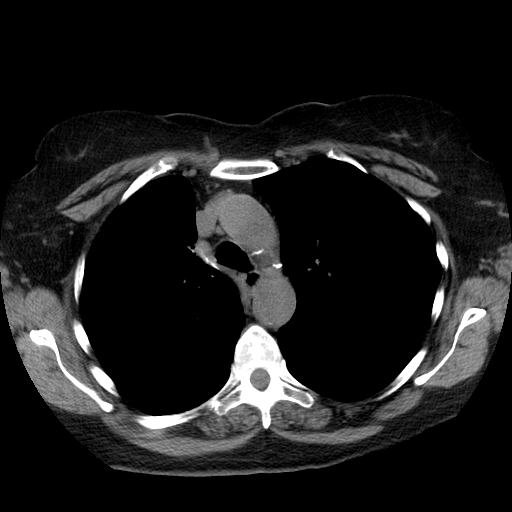
[im 46/66  lung]
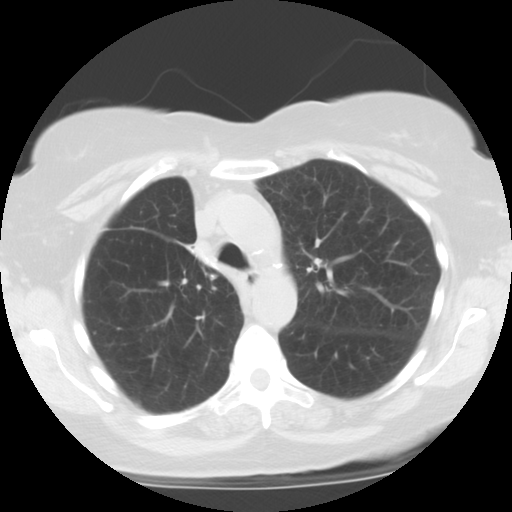
[im 51/66  lung]
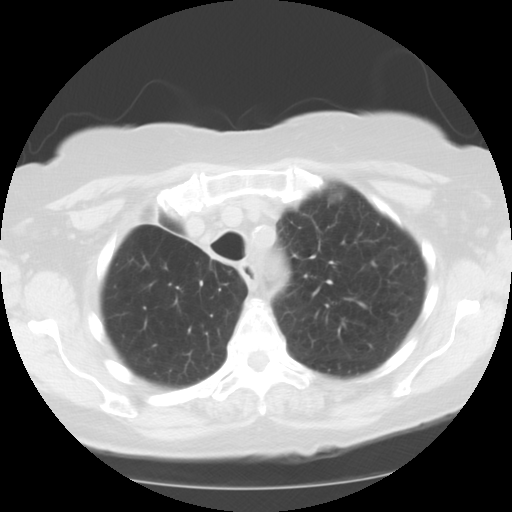
[im 56/66  lung]
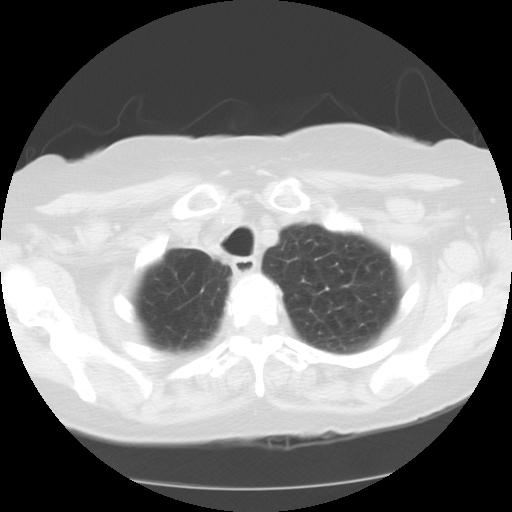
[im 61/66  lung]
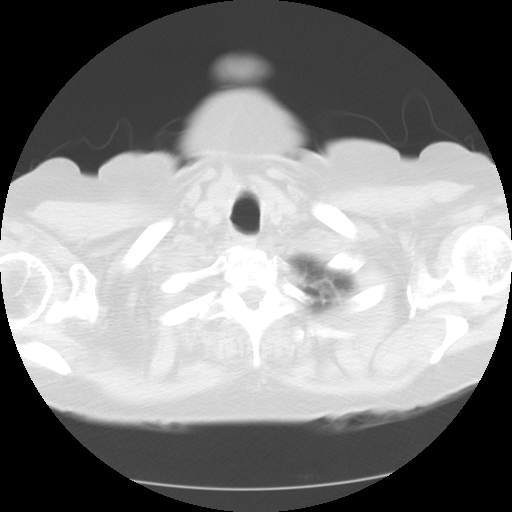

[Series 602: sagittal body · sagittal · 0.64mm/px · 3 of 133 slices shown]
[im 27/133  lung]
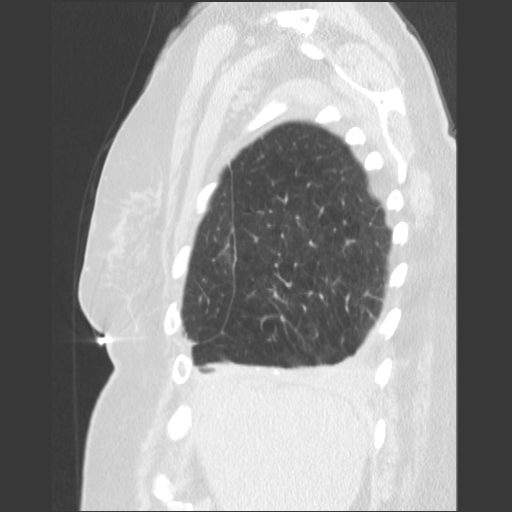
[im 53/133  lung]
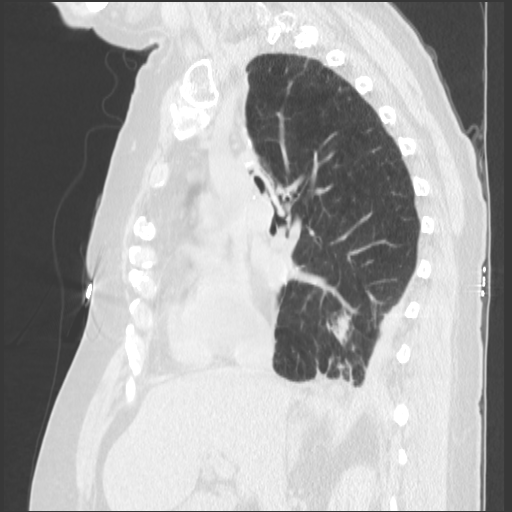
[im 80/133  lung]
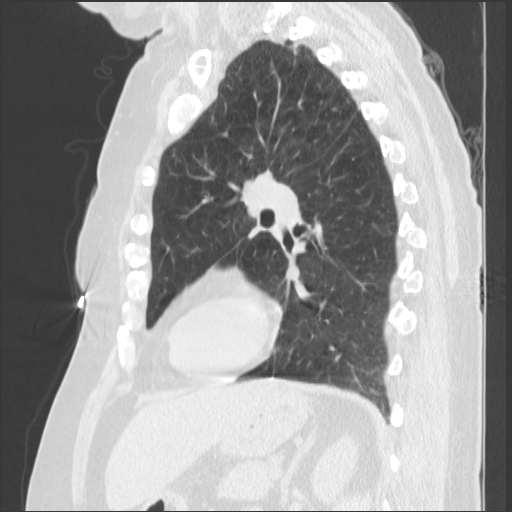

[15 of 36 positions shown; findings below may reference images not displayed]

FINDINGS: Centrilobular emphysema.  Post surgical pleural thickening right lower lung zone.   Previously noted minute nodule left lower lobe is unchanged (see image #34).  Two minute nodules lateral aspect of the right upper lung zone unchanged.  Ill-defined parenchymal densities in the medial aspect of the right lower lung zone and also the right lower lobe appear more impressive than noted on prior exam .An active inflammatory focus process cannot be excluded.  Ill-defined nodular like density measuring approximately 8 x 12mm in the medial aspect of the right lower lobe is a new finding (see image #43).   The etiology for this finding is uncertain.  Ill-defined pleuroparenchymal density at the right posterior base represents a new finding as well.
IMPRESSION: There are some new densities in the right lung particularly the right lower lung zone, the etiology of which is uncertain.  There is a density on image #43 which has a nodular like appearance and therefore the possibility of lung carcinoma cannot be excluded.  For follow up I at least recommend CT scan in the not to distant future for example at 4 to 6 months. Alternatively, consider a PET scan.

## 2007-10-31 ENCOUNTER — Ambulatory Visit: Payer: Self-pay | Admitting: Internal Medicine

## 2007-12-06 ENCOUNTER — Telehealth: Payer: Self-pay | Admitting: Internal Medicine

## 2007-12-12 ENCOUNTER — Ambulatory Visit: Payer: Self-pay | Admitting: Internal Medicine

## 2007-12-14 ENCOUNTER — Ambulatory Visit: Payer: Self-pay | Admitting: Oncology

## 2007-12-18 ENCOUNTER — Encounter: Payer: Self-pay | Admitting: Internal Medicine

## 2007-12-18 LAB — CBC WITH DIFFERENTIAL/PLATELET
Basophils Absolute: 0 10*3/uL (ref 0.0–0.1)
Eosinophils Absolute: 0.1 10*3/uL (ref 0.0–0.5)
HCT: 36.9 % (ref 34.8–46.6)
LYMPH%: 34.6 % (ref 14.0–48.0)
MCV: 96 fL (ref 81.0–101.0)
MONO#: 0.6 10*3/uL (ref 0.1–0.9)
MONO%: 9.8 % (ref 0.0–13.0)
NEUT#: 3.2 10*3/uL (ref 1.5–6.5)
NEUT%: 54.1 % (ref 39.6–76.8)
Platelets: 239 10*3/uL (ref 145–400)
WBC: 5.9 10*3/uL (ref 3.9–10.0)

## 2007-12-18 LAB — COMPREHENSIVE METABOLIC PANEL
BUN: 8 mg/dL (ref 6–23)
CO2: 27 mEq/L (ref 19–32)
Creatinine, Ser: 0.84 mg/dL (ref 0.40–1.20)
Glucose, Bld: 93 mg/dL (ref 70–99)
Sodium: 138 mEq/L (ref 135–145)
Total Bilirubin: 0.7 mg/dL (ref 0.3–1.2)
Total Protein: 7.8 g/dL (ref 6.0–8.3)

## 2007-12-20 ENCOUNTER — Encounter: Payer: Self-pay | Admitting: Internal Medicine

## 2007-12-20 ENCOUNTER — Ambulatory Visit (HOSPITAL_COMMUNITY): Admission: RE | Admit: 2007-12-20 | Discharge: 2007-12-20 | Payer: Self-pay | Admitting: Oncology

## 2007-12-25 ENCOUNTER — Encounter: Payer: Self-pay | Admitting: Internal Medicine

## 2008-03-03 ENCOUNTER — Ambulatory Visit: Payer: Self-pay | Admitting: Internal Medicine

## 2008-03-03 ENCOUNTER — Encounter: Admission: RE | Admit: 2008-03-03 | Discharge: 2008-03-03 | Payer: Self-pay | Admitting: Internal Medicine

## 2008-03-05 ENCOUNTER — Encounter: Payer: Self-pay | Admitting: Internal Medicine

## 2008-03-12 ENCOUNTER — Ambulatory Visit: Payer: Self-pay | Admitting: Internal Medicine

## 2008-03-12 DIAGNOSIS — J45909 Unspecified asthma, uncomplicated: Secondary | ICD-10-CM | POA: Insufficient documentation

## 2008-03-12 DIAGNOSIS — E039 Hypothyroidism, unspecified: Secondary | ICD-10-CM

## 2008-03-13 ENCOUNTER — Encounter: Payer: Self-pay | Admitting: Internal Medicine

## 2008-05-07 ENCOUNTER — Ambulatory Visit: Payer: Self-pay | Admitting: Internal Medicine

## 2008-05-22 ENCOUNTER — Ambulatory Visit: Payer: Self-pay | Admitting: Internal Medicine

## 2008-05-28 ENCOUNTER — Ambulatory Visit: Payer: Self-pay | Admitting: Internal Medicine

## 2008-06-03 ENCOUNTER — Ambulatory Visit: Payer: Self-pay | Admitting: Internal Medicine

## 2008-06-03 DIAGNOSIS — H28 Cataract in diseases classified elsewhere: Secondary | ICD-10-CM | POA: Insufficient documentation

## 2008-06-03 DIAGNOSIS — R350 Frequency of micturition: Secondary | ICD-10-CM | POA: Insufficient documentation

## 2008-06-03 LAB — CONVERTED CEMR LAB
Basophils Relative: 0.2 % (ref 0.0–3.0)
CO2: 32 meq/L (ref 19–32)
Chloride: 99 meq/L (ref 96–112)
Creatinine, Ser: 1 mg/dL (ref 0.4–1.2)
Direct LDL: 172.6 mg/dL
Eosinophils Absolute: 0.1 10*3/uL (ref 0.0–0.7)
Glucose, Bld: 109 mg/dL — ABNORMAL HIGH (ref 70–99)
HCT: 41 % (ref 36.0–46.0)
HDL: 75.1 mg/dL (ref >39.0)
Hemoglobin, Urine: NEGATIVE
Hemoglobin: 14.1 g/dL (ref 12.0–15.0)
Hgb A1c MFr Bld: 5.1 % (ref 4.6–6.0)
Ketones, ur: NEGATIVE mg/dL
Monocytes Absolute: 0.8 10*3/uL (ref 0.1–1.0)
Neutro Abs: 4.5 10*3/uL (ref 1.4–7.7)
Neutrophils Relative %: 63.2 % (ref 43.0–77.0)
RBC / HPF: NONE SEEN
RBC: 4.18 M/uL (ref 3.87–5.11)
RDW: 11.8 % (ref 11.5–14.6)
Total Bilirubin: 0.9 mg/dL (ref 0.3–1.2)
Total CHOL/HDL Ratio: 3.5
Total Protein, Urine: NEGATIVE mg/dL
Urine Glucose: NEGATIVE mg/dL
Urobilinogen, UA: 0.2 (ref 0.0–1.0)
WBC: 7.2 10*3/uL (ref 4.5–10.5)

## 2008-06-22 IMAGING — CT CT CHEST W/ CM
1 of 2 series · 14 of 30 positions shown, 18 images · IV contrast (omnipaque)
Comparison: 08/02/2006 and 04/05/2006

CLINICAL DATA: Lung cancer diagnosed in 5008. Status post partial right lobectomy. Follow up. 
CHEST CT WITH CONTRAST:
TECHNIQUE: Multidetector CT imaging of the chest was performed following the standard protocol during bolus administration of intravenous contrast.
Contrast:  80 cc Omnipaque 300

[Series 2: chest_routine 5.0 b40f st · axial · 0.66mm/px · z∈[-344,-39]mm · 14 of 73 slices shown, 18 images]
[im 6/73  mediastinal]
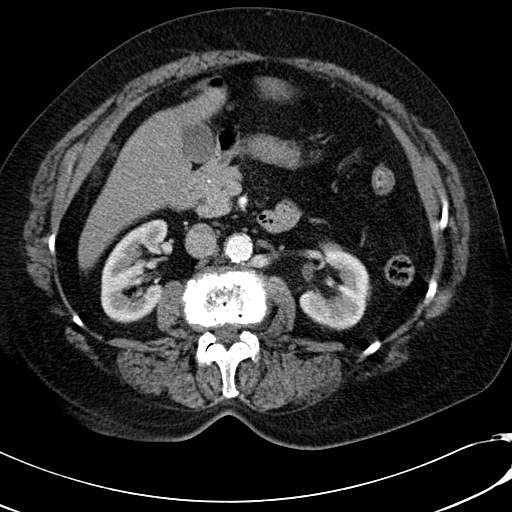
[im 6/73  lung]
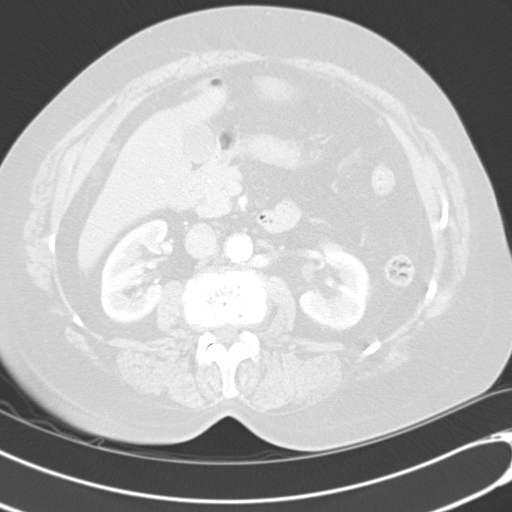
[im 11/73  lung]
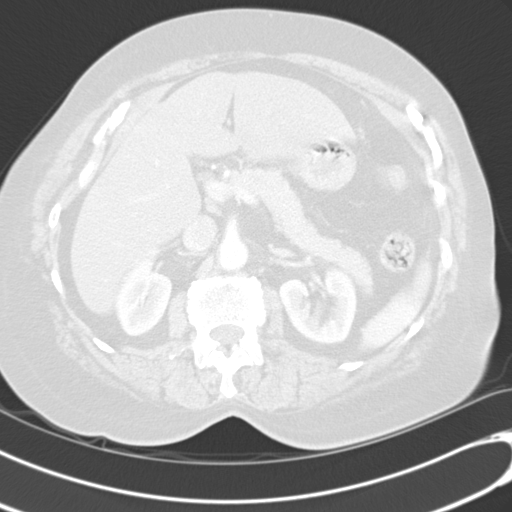
[im 16/73  lung]
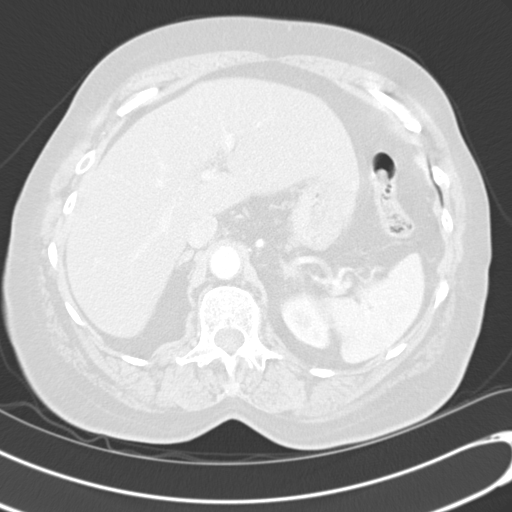
[im 21/73  lung]
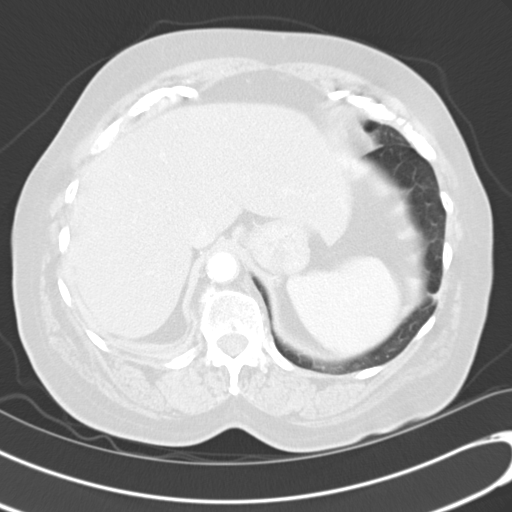
[im 26/73  mediastinal]
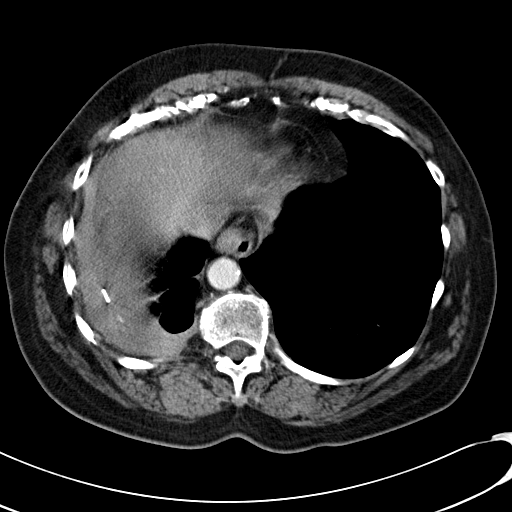
[im 26/73  lung]
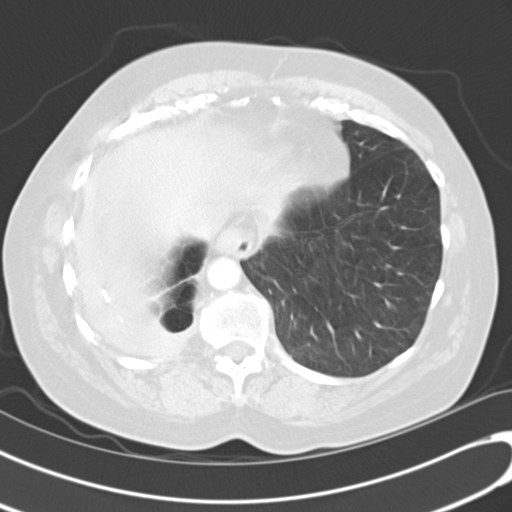
[im 31/73  lung]
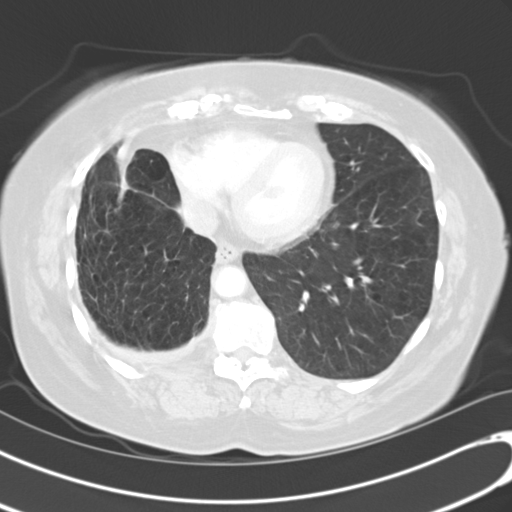
[im 35/73  lung]
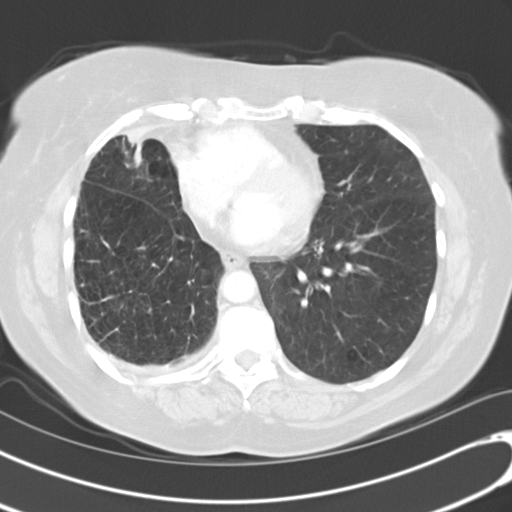
[im 37/73  lung]
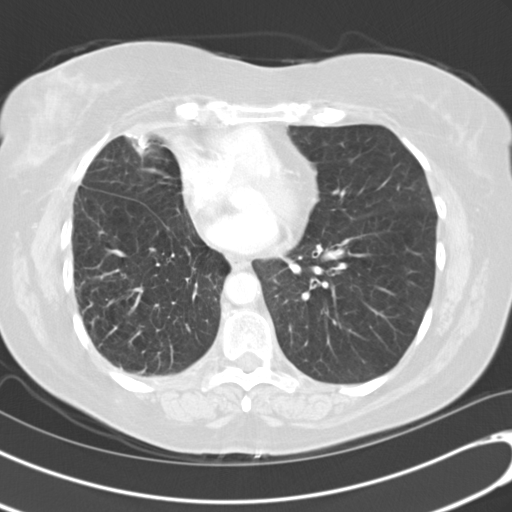
[im 42/73  mediastinal]
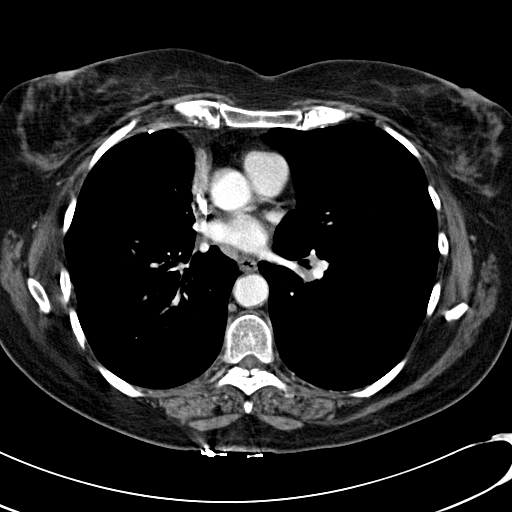
[im 42/73  lung]
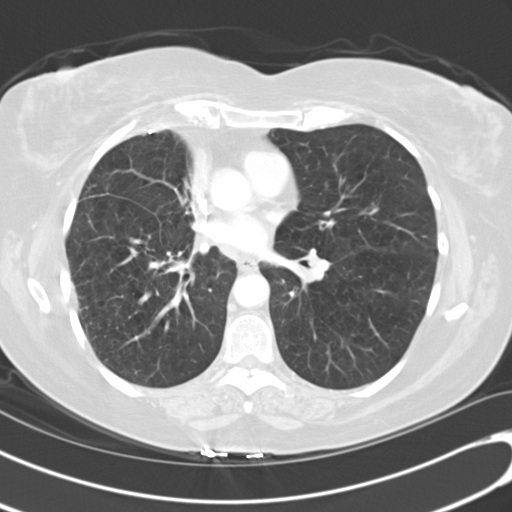
[im 47/73  lung]
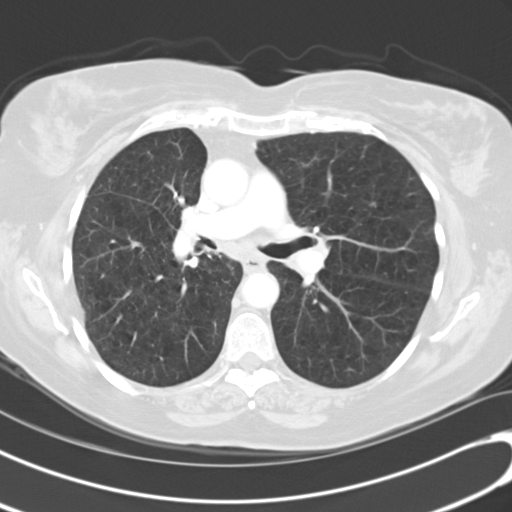
[im 52/73  lung]
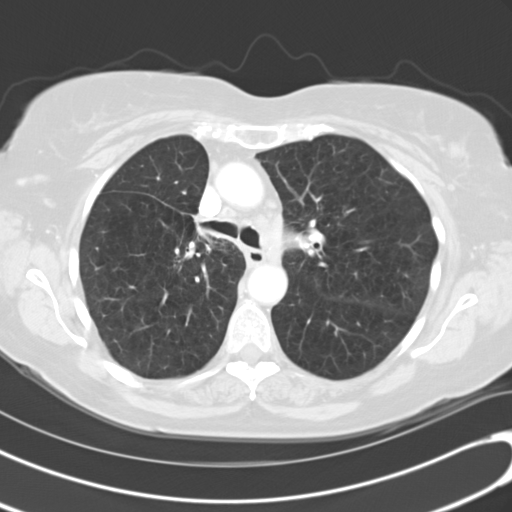
[im 57/73  lung]
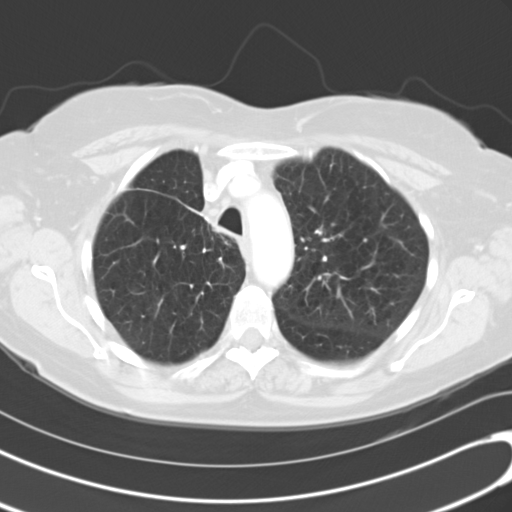
[im 62/73  mediastinal]
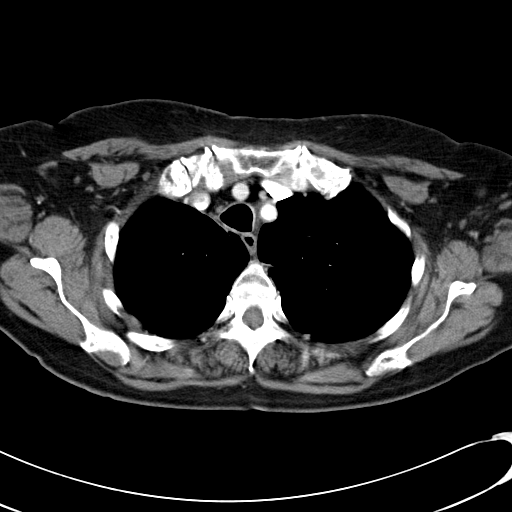
[im 62/73  lung]
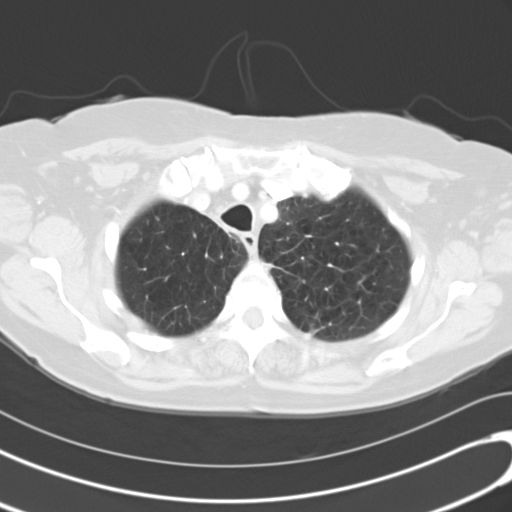
[im 67/73  lung]
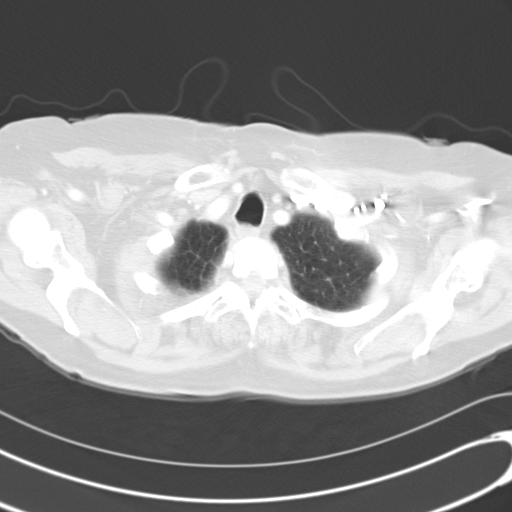

[14 of 30 positions shown; findings below may reference images not displayed]

FINDINGS: There are stable postsurgical changes in the right hemithorax status post right upper lobe resection. Nodular thickening of the minor fissure measuring up to 9 mm in diameter on image 25 is unchanged. There are no pulmonary nodules. Emphysematous changes throughout the lungs are unchanged. The right-sided pleural thickening appears stable. There is no pleural effusion or pericardial effusion.
A prominent 1.9 x 1.0 cm subcarinal lymph node on image 27 is unchanged. There are also prominent lymph nodes in the AP window measuring up to 7 mm in short axis diameter. These appear stable as well. There is no progressive adenopathy.
There is a stable small hiatal hernia. No adrenal mass or abnormality of the visualized liver is demonstrated.
IMPRESSION: Stable postoperative chest with pleural thickening on the right and stable prominent mediastinal lymph nodes.  No evidence of local recurrence or definite metastatic disease.

## 2008-06-23 ENCOUNTER — Ambulatory Visit: Payer: Self-pay | Admitting: Oncology

## 2008-06-25 ENCOUNTER — Ambulatory Visit (HOSPITAL_COMMUNITY): Admission: RE | Admit: 2008-06-25 | Discharge: 2008-06-25 | Payer: Self-pay | Admitting: Oncology

## 2008-06-25 ENCOUNTER — Encounter: Payer: Self-pay | Admitting: Internal Medicine

## 2008-06-25 LAB — CBC WITH DIFFERENTIAL/PLATELET
BASO%: 0.4 % (ref 0.0–2.0)
Basophils Absolute: 0 10*3/uL (ref 0.0–0.1)
EOS%: 1.9 % (ref 0.0–7.0)
Eosinophils Absolute: 0.1 10*3/uL (ref 0.0–0.5)
HCT: 39.2 % (ref 34.8–46.6)
HGB: 13.3 g/dL (ref 11.6–15.9)
LYMPH%: 31.6 % (ref 14.0–48.0)
MCH: 33.2 pg (ref 26.0–34.0)
MCHC: 33.9 g/dL (ref 32.0–36.0)
MCV: 97.9 fL (ref 81.0–101.0)
MONO#: 0.6 10*3/uL (ref 0.1–0.9)
MONO%: 12.1 % (ref 0.0–13.0)
NEUT#: 2.6 10*3/uL (ref 1.5–6.5)
NEUT%: 54 % (ref 39.6–76.8)
Platelets: 206 10*3/uL (ref 145–400)
RBC: 4.01 10*6/uL (ref 3.70–5.32)
RDW: 12.7 % (ref 11.3–14.5)
WBC: 4.8 10*3/uL (ref 3.9–10.0)
lymph#: 1.5 10*3/uL (ref 0.9–3.3)

## 2008-06-25 LAB — COMPREHENSIVE METABOLIC PANEL
AST: 41 U/L — ABNORMAL HIGH (ref 0–37)
Alkaline Phosphatase: 77 U/L (ref 39–117)
BUN: 5 mg/dL — ABNORMAL LOW (ref 6–23)
Glucose, Bld: 106 mg/dL — ABNORMAL HIGH (ref 70–99)
Potassium: 4.2 mEq/L (ref 3.5–5.3)
Sodium: 136 mEq/L (ref 135–145)
Total Bilirubin: 0.6 mg/dL (ref 0.3–1.2)

## 2008-06-30 ENCOUNTER — Encounter: Payer: Self-pay | Admitting: Internal Medicine

## 2008-09-23 ENCOUNTER — Ambulatory Visit: Payer: Self-pay | Admitting: Internal Medicine

## 2008-09-24 ENCOUNTER — Encounter: Payer: Self-pay | Admitting: Internal Medicine

## 2008-09-30 ENCOUNTER — Telehealth (INDEPENDENT_AMBULATORY_CARE_PROVIDER_SITE_OTHER): Payer: Self-pay | Admitting: *Deleted

## 2008-10-27 ENCOUNTER — Telehealth: Payer: Self-pay | Admitting: Internal Medicine

## 2008-10-29 ENCOUNTER — Ambulatory Visit: Payer: Self-pay | Admitting: Internal Medicine

## 2008-10-29 DIAGNOSIS — E7800 Pure hypercholesterolemia, unspecified: Secondary | ICD-10-CM | POA: Insufficient documentation

## 2008-10-29 LAB — CONVERTED CEMR LAB
AST: 51 units/L — ABNORMAL HIGH (ref 0–37)
Alkaline Phosphatase: 89 units/L (ref 39–117)
Basophils Absolute: 0.3 10*3/uL — ABNORMAL HIGH (ref 0.0–0.1)
Bilirubin, Direct: 0.2 mg/dL (ref 0.0–0.3)
Chloride: 100 meq/L (ref 96–112)
Cholesterol: 241 mg/dL — ABNORMAL HIGH (ref 0–200)
Eosinophils Absolute: 0 10*3/uL (ref 0.0–0.7)
Hemoglobin: 13.8 g/dL (ref 12.0–15.0)
Hgb A1c MFr Bld: 5.1 % (ref 4.6–6.5)
Ketones, ur: NEGATIVE mg/dL
Lymphocytes Relative: 19.4 % (ref 12.0–46.0)
MCHC: 34.9 g/dL (ref 30.0–36.0)
MCV: 98.2 fL (ref 78.0–100.0)
Monocytes Absolute: 0.6 10*3/uL (ref 0.1–1.0)
Monocytes Relative: 9.4 % (ref 3.0–12.0)
Neutrophils Relative %: 66 % (ref 43.0–77.0)
Platelets: 181 10*3/uL (ref 150.0–400.0)
Potassium: 4.2 meq/L (ref 3.5–5.1)
Specific Gravity, Urine: 1.01 (ref 1.000–1.030)
TSH: 4.19 microintl units/mL (ref 0.35–5.50)
Total Bilirubin: 0.9 mg/dL (ref 0.3–1.2)
Total Protein, Urine: NEGATIVE mg/dL
Urine Glucose: NEGATIVE mg/dL
Urobilinogen, UA: 0.2 (ref 0.0–1.0)
VLDL: 34.4 mg/dL (ref 0.0–40.0)

## 2008-11-04 ENCOUNTER — Telehealth: Payer: Self-pay | Admitting: Internal Medicine

## 2008-11-15 ENCOUNTER — Encounter: Payer: Self-pay | Admitting: Internal Medicine

## 2008-11-24 ENCOUNTER — Ambulatory Visit: Payer: Self-pay | Admitting: Internal Medicine

## 2008-11-27 ENCOUNTER — Ambulatory Visit: Payer: Self-pay | Admitting: Internal Medicine

## 2008-11-27 LAB — CONVERTED CEMR LAB: HDL goal, serum: 40 mg/dL

## 2008-12-01 ENCOUNTER — Telehealth: Payer: Self-pay | Admitting: Internal Medicine

## 2008-12-19 ENCOUNTER — Ambulatory Visit: Payer: Self-pay | Admitting: Oncology

## 2008-12-24 ENCOUNTER — Encounter: Payer: Self-pay | Admitting: Internal Medicine

## 2008-12-24 ENCOUNTER — Ambulatory Visit (HOSPITAL_COMMUNITY): Admission: RE | Admit: 2008-12-24 | Discharge: 2008-12-24 | Payer: Self-pay | Admitting: Oncology

## 2008-12-24 LAB — COMPREHENSIVE METABOLIC PANEL
Alkaline Phosphatase: 70 U/L (ref 39–117)
BUN: 5 mg/dL — ABNORMAL LOW (ref 6–23)
Creatinine, Ser: 0.89 mg/dL (ref 0.40–1.20)
Glucose, Bld: 126 mg/dL — ABNORMAL HIGH (ref 70–99)
Sodium: 137 mEq/L (ref 135–145)
Total Bilirubin: 0.9 mg/dL (ref 0.3–1.2)

## 2008-12-24 LAB — CBC WITH DIFFERENTIAL/PLATELET
Basophils Absolute: 0 10*3/uL (ref 0.0–0.1)
Eosinophils Absolute: 0.1 10*3/uL (ref 0.0–0.5)
HCT: 38.3 % (ref 34.8–46.6)
LYMPH%: 26.3 % (ref 14.0–49.7)
MCV: 98.6 fL (ref 79.5–101.0)
MONO%: 10.4 % (ref 0.0–14.0)
NEUT#: 3.3 10*3/uL (ref 1.5–6.5)
NEUT%: 61.6 % (ref 38.4–76.8)
Platelets: 209 10*3/uL (ref 145–400)
RBC: 3.89 10*6/uL (ref 3.70–5.45)

## 2008-12-29 ENCOUNTER — Telehealth: Payer: Self-pay | Admitting: Internal Medicine

## 2008-12-29 ENCOUNTER — Encounter: Payer: Self-pay | Admitting: Internal Medicine

## 2008-12-30 ENCOUNTER — Telehealth: Payer: Self-pay | Admitting: Internal Medicine

## 2009-03-03 ENCOUNTER — Telehealth: Payer: Self-pay | Admitting: Family Medicine

## 2009-03-06 ENCOUNTER — Encounter: Admission: RE | Admit: 2009-03-06 | Discharge: 2009-03-06 | Payer: Self-pay | Admitting: Internal Medicine

## 2009-03-11 ENCOUNTER — Encounter: Admission: RE | Admit: 2009-03-11 | Discharge: 2009-03-11 | Payer: Self-pay | Admitting: Internal Medicine

## 2009-03-11 ENCOUNTER — Encounter: Payer: Self-pay | Admitting: Internal Medicine

## 2009-04-01 ENCOUNTER — Ambulatory Visit: Payer: Self-pay | Admitting: Internal Medicine

## 2009-04-01 LAB — CONVERTED CEMR LAB
ALT: 26 units/L (ref 0–35)
AST: 49 units/L — ABNORMAL HIGH (ref 0–37)
Albumin: 4 g/dL (ref 3.5–5.2)
Alkaline Phosphatase: 80 units/L (ref 39–117)
Basophils Absolute: 0.1 10*3/uL (ref 0.0–0.1)
Bilirubin Urine: NEGATIVE
CO2: 33 meq/L — ABNORMAL HIGH (ref 19–32)
Calcium: 9.6 mg/dL (ref 8.4–10.5)
Eosinophils Relative: 1.6 % (ref 0.0–5.0)
Glucose, Bld: 93 mg/dL (ref 70–99)
Hemoglobin, Urine: NEGATIVE
Hgb A1c MFr Bld: 5.2 % (ref 4.6–6.5)
Lymphs Abs: 2 10*3/uL (ref 0.7–4.0)
RBC: 3.81 M/uL — ABNORMAL LOW (ref 3.87–5.11)
RDW: 12.5 % (ref 11.5–14.6)
Specific Gravity, Urine: <=1.005 (ref 1.000–1.030)
Total Bilirubin: 0.9 mg/dL (ref 0.3–1.2)
Total Protein, Urine: NEGATIVE mg/dL
Urine Glucose: NEGATIVE mg/dL
WBC: 7.3 10*3/uL (ref 4.5–10.5)
pH: 7 (ref 5.0–8.0)

## 2009-04-02 ENCOUNTER — Encounter: Payer: Self-pay | Admitting: Internal Medicine

## 2009-04-05 IMAGING — CT CT CHEST W/ CM
2 of 3 series · 15 of 36 positions shown, 18 images · IV contrast (omnipaque)
Comparison: CT 05/28/07.

CLINICAL DATA: Shortness of breath, follow-up lung nodule, history of right upper lobectomy for lung cancer. 
CHEST CT WITH CONTRAST ? 09/11/07:
TECHNIQUE: Multidetector CT imaging of the chest was performed following the standard protocol during bolus administration of intravenous contrast.
Contrast:  80 cc Omnipaque 300.

[Series 2: chest routine 5.0 b40f · axial · 0.73mm/px · z∈[-312,-28]mm · 12 of 67 slices shown, 15 images]
[im 5/67  mediastinal]
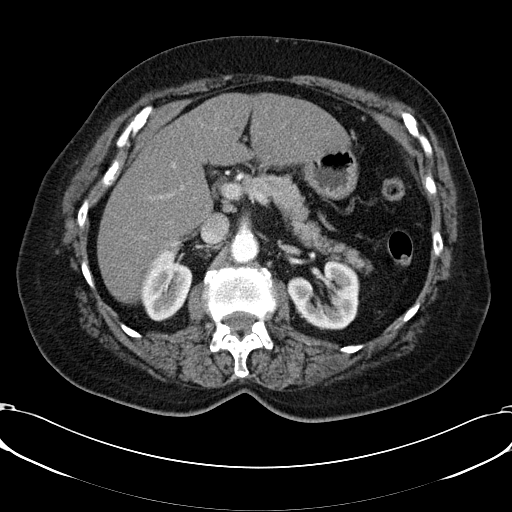
[im 5/67  lung]
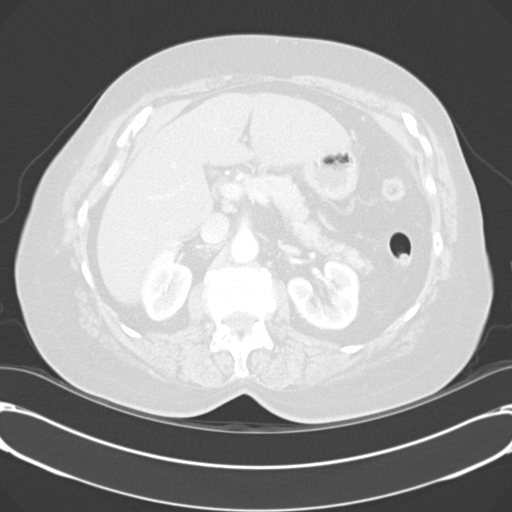
[im 10/67  lung]
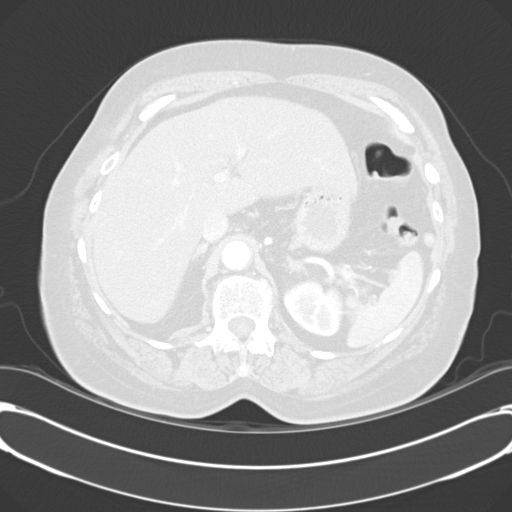
[im 15/67  lung]
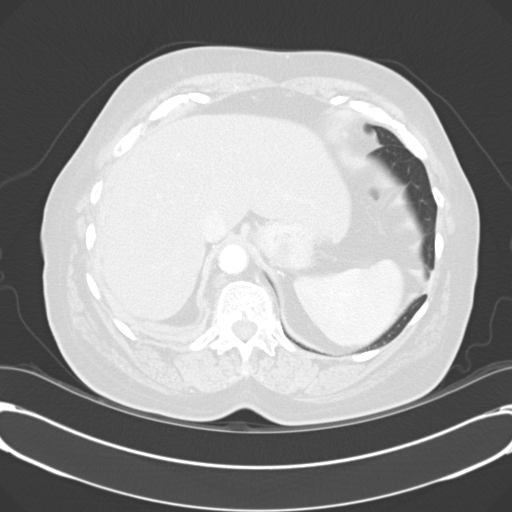
[im 20/67  lung]
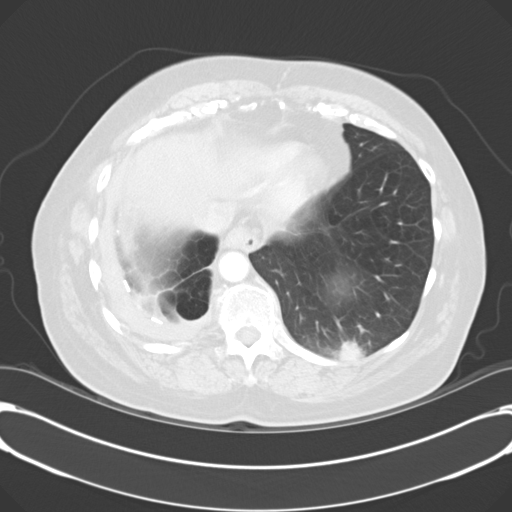
[im 25/67  mediastinal]
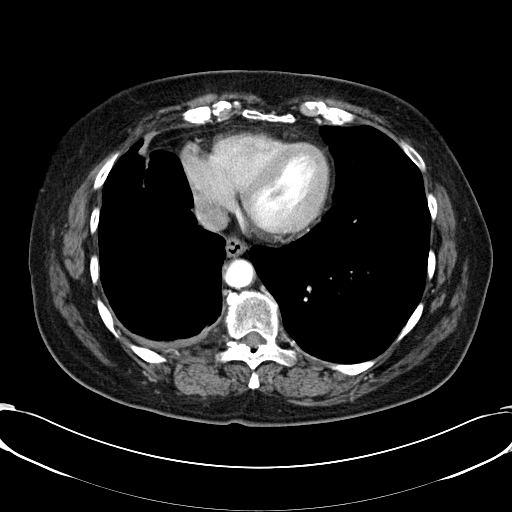
[im 25/67  lung]
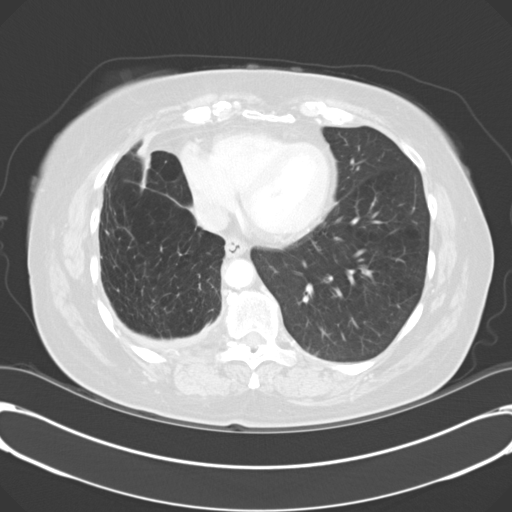
[im 30/67  lung]
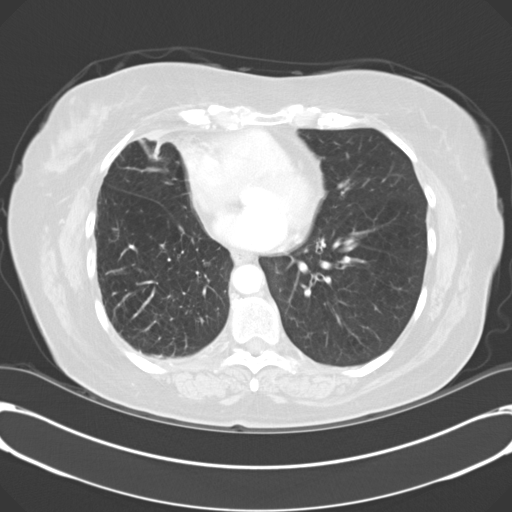
[im 37/67  lung]
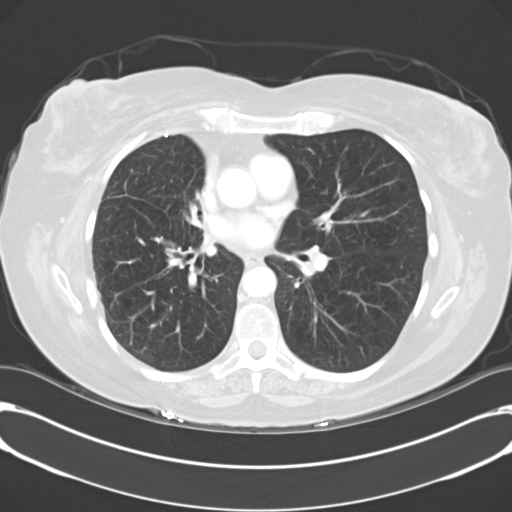
[im 42/67  lung]
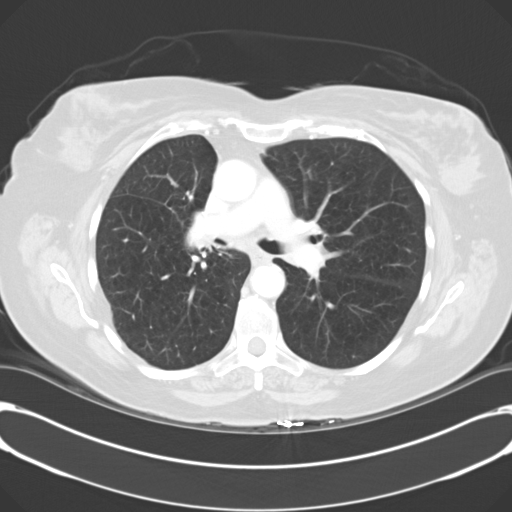
[im 47/67  mediastinal]
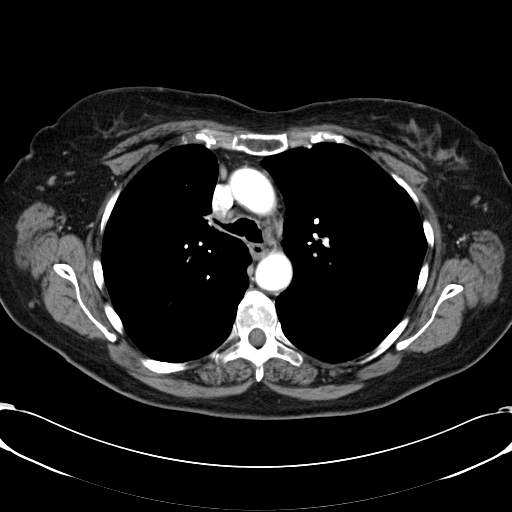
[im 47/67  lung]
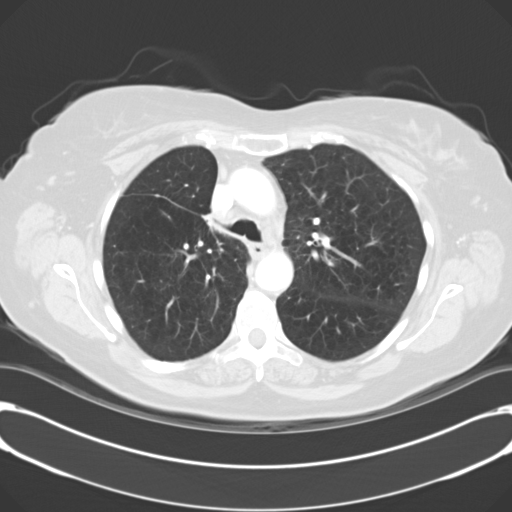
[im 52/67  lung]
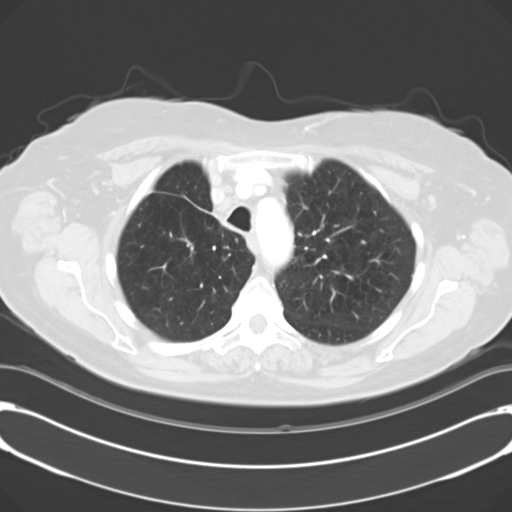
[im 57/67  lung]
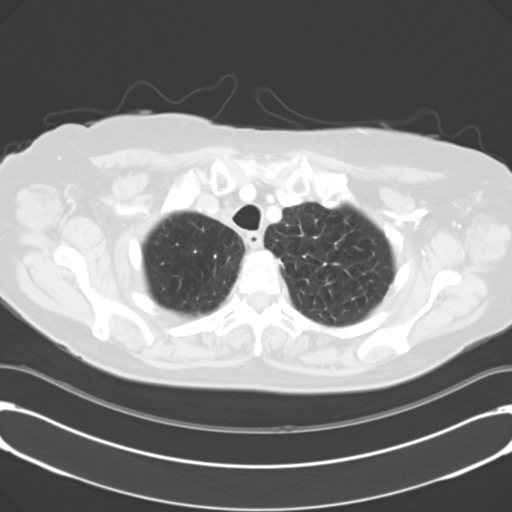
[im 62/67  lung]
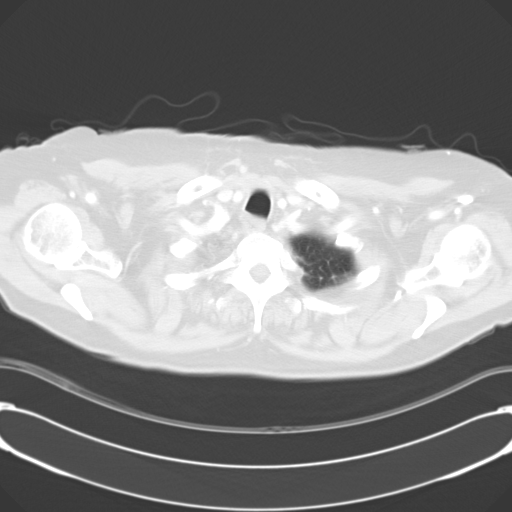

[Series 602: <mpr thick range> · coronal · 0.73mm/px · 3 of 84 slices shown]
[im 17/84  lung]
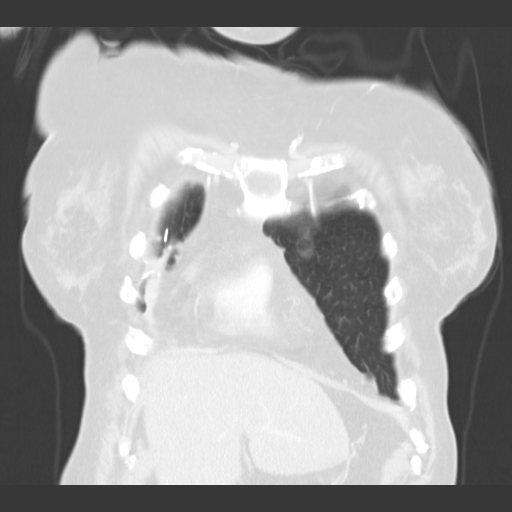
[im 34/84  lung]
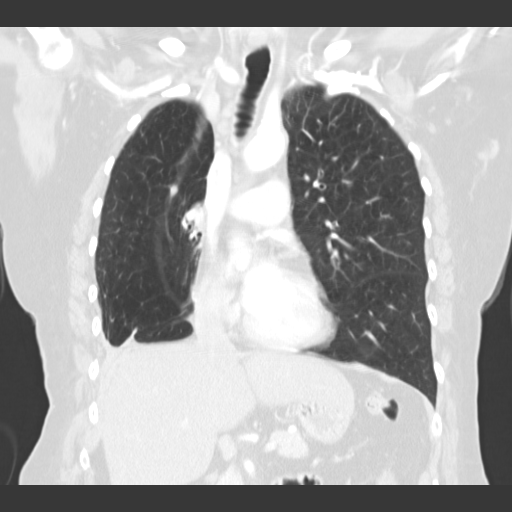
[im 50/84  lung]
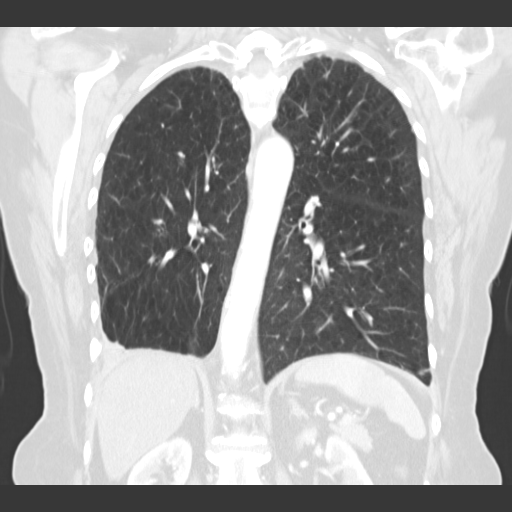

[15 of 36 positions shown; findings below may reference images not displayed]

FINDINGS: The previously seen nodule in the superior segment left lower lobe is no longer identified.  However, there is a new peripheral left lower lobe spiculated mass measuring 1.7 x 1.5 cm.  This abuts the pleura but makes acute angles with it, suggesting pulmonary parenchymal origin.  Diffuse emphysematous changes are again identified.  There is stable nodularity along the right major fissure on image 25.  Central airways are patent.  Small pretracheal lymph node is stable.  Heart size is normal.   No pericardial effusion.  Right pleural thickening is stable.   No axillary lymphadenopathy. On incomplete imaging of the upper abdomen, the adrenal glands are normal.  Degenerative changes are noted in the spine.  No lytic or sclerotic osseous lesion is seen to suggest metastasis.
IMPRESSION: 1.  New spiculated left lower lobe pulmonary parenchymal mass, worrisome for metastasis or new primary bronchogenic carcinoma.  Infectious/inflammatory etiology would be much less likely.  Correlate with any clinical sign/symptom suggesting infection and/or consider PET CT for further evaluation.  
2.  The previously seen left lower lobe nodule is not identified on today?s exam. 
The findings were called to Dr. Kolxi by Dr. Cryer on 09/11/07 at [DATE] a.m.

## 2009-04-12 IMAGING — PT NM PET TUM IMG SKULL BASE T - THIGH
7 series · 25 of 25 positions shown · non-contrast
Comparison: Whole body bone scan 04/07/2004, PET CT 09/11/2003 and
chest CT dated 09/11/2007.

CLINICAL DATA: Staging recurrent lung cancer.  Postop right upper
lobe resection in 3666.

NUCLEAR MEDICINE PET CT - SKULL BASE THROUGH THIGHS
TECHNIQUE: 18.7 mCi F-18 FDG was injected intravenously via the
right antecubital fossa.  Full-ring PET imaging was performed from
the skull base through the mid-thighs 80  minutes after injection.
CT data was obtained and used for attenuation correction and
anatomic localization only.  (This was not acquired as a diagnostic
CT examination.)

[Series 1: pet ac · axial · 3.3mm · 4.69mm/px · z∈[-870,+0]mm · 5 of 267 slices shown]
[im 1/267]
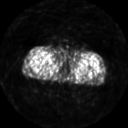
[im 67/267]
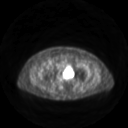
[im 134/267]
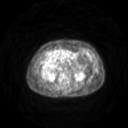
[im 200/267]
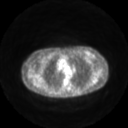
[im 267/267]
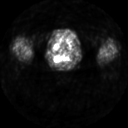

[Series 2: ct images · axial · 3.8mm · 0.98mm/px · z∈[-870,+0]mm · 5 of 267 slices shown]
[im 1/267]
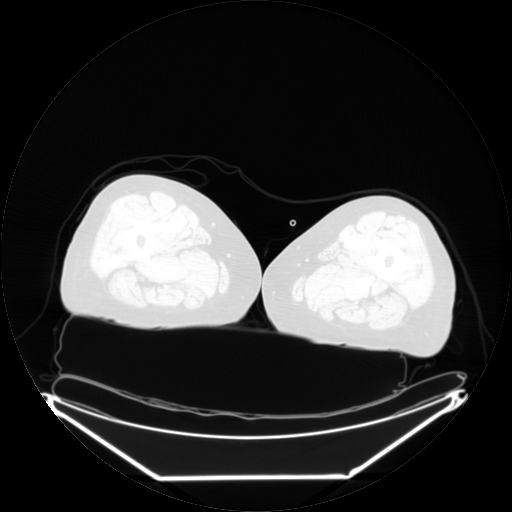
[im 67/267]
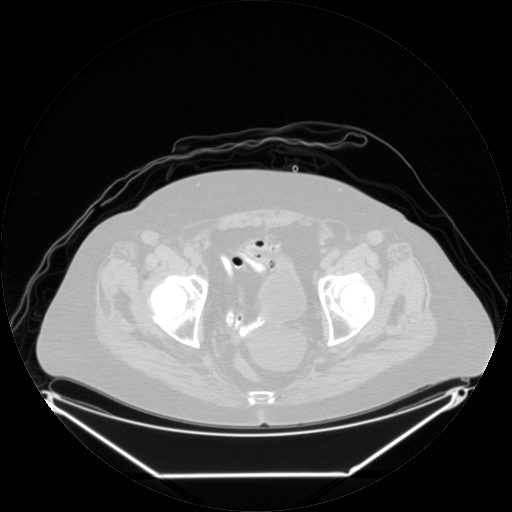
[im 134/267]
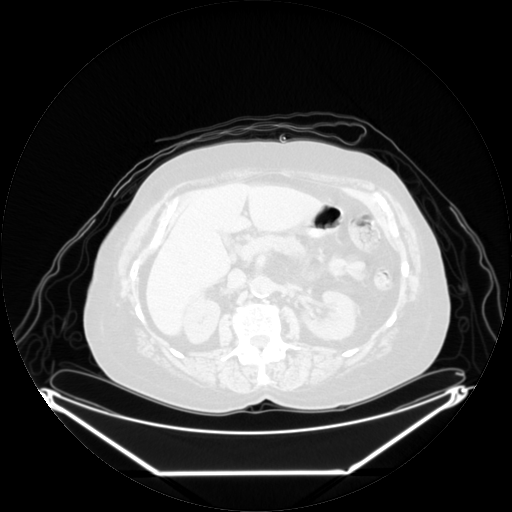
[im 200/267]
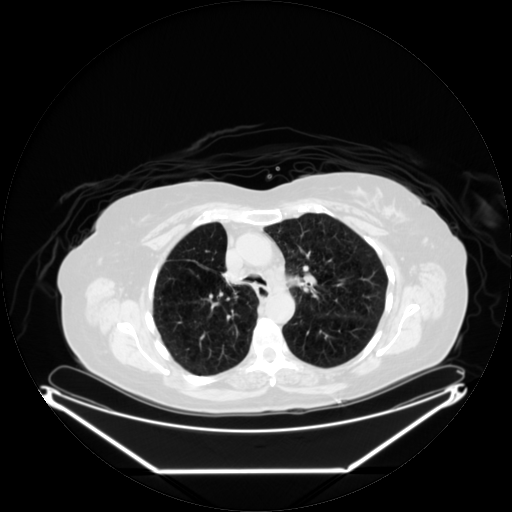
[im 267/267]
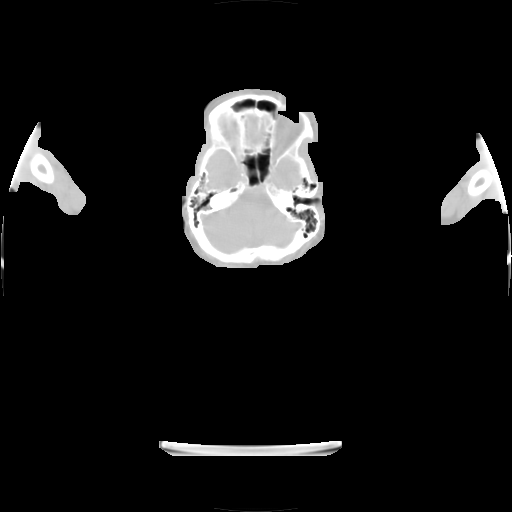

[Series 2: pet nac · axial · 3.3mm · 4.69mm/px · z∈[-870,+0]mm · 5 of 267 slices shown]
[im 1/267]
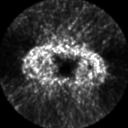
[im 67/267]
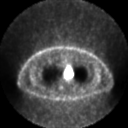
[im 134/267]
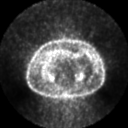
[im 200/267]
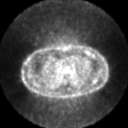
[im 267/267]
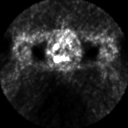

[Series 123: mip · coronal · 3.3mm · 4.69mm/px · 1 of 30 slices shown]
[im 1/30]
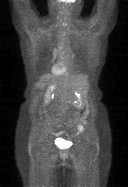

[Series 150: reformatted · axial · 3.3mm · 1.02mm/px · 1 of 3 slices shown (1 of 3)]
[im 1/3]
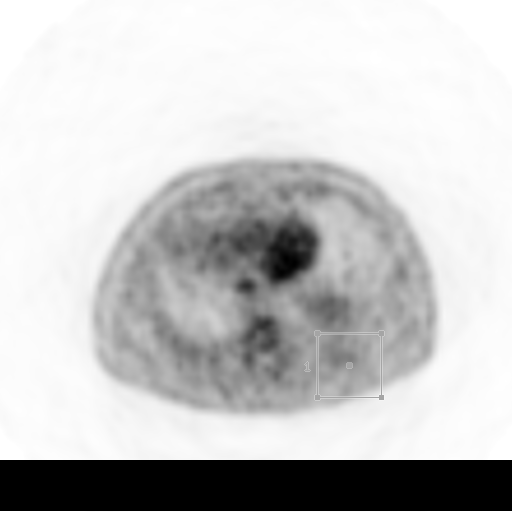

[Series 151: reformatted · axial · 3.3mm · 3.91mm/px · z∈[-870,+0]mm · 6 of 265 slices shown (2 of 3)]
[im 1/265]
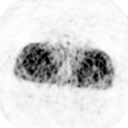
[im 53/265]
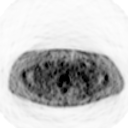
[im 106/265]
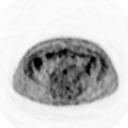
[im 159/265]
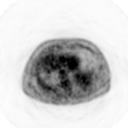
[im 212/265]
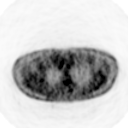
[im 265/265]
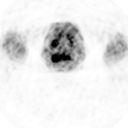

[Series 153: reformatted · coronal · 4.7mm · 6.98mm/px · 2 of 77 slices shown (3 of 3)]
[im 1/77]
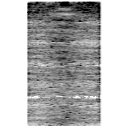
[im 77/77]
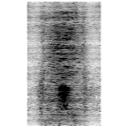

[25 of 25 positions shown; findings below may reference images not displayed]

FINDINGS: There is no abnormal metabolic activity associated with
the subpleural nodule in the left lower lobe demonstrated on the
recent CT.  This measures approximately 1.7 cm in diameter on image
106 . No other abnormal pulmonary parenchymal activity is present.
There is no abnormal activity associated with the postoperative
pleural thickening on the right.  No hypermetabolic mediastinal or
hilar nodal activity is demonstrated.  There is slightly prominent
left hilar activity with a maximal SUV of 3.0.  This is
nonspecific.

 There is no hypermetabolic nodal activity in the neck, abdomen or
pelvis.  No hepatic or adrenal metastases are demonstrated.

On the CT images through the pelvis, there is a low density
(probably cystic) lesion on the left measuring 6.0 x 4.6 cm on
image 195.  There is no apparent solid component or abnormal
metabolic activity associated with this lesion.  With the aid of
retrospection, there was a smaller low density lesion in this area
on the prior PET CT from August 2003, but that measured 2.8 cm in
greatest dimension at that time.
IMPRESSION: 1.  The subpleural nodule in the left lower lobe demonstrates no
hypermetabolic activity.  This supports a benign etiology, possibly
"rounded atelectasis."  CT followup in 6 months is recommended.
2.  No typical metastases are identified.  Low-level left hilar
activity is nonspecific and without associated enlarged lymph nodes
on the recent diagnostic CT.
3.  Enlarging low density structure in the left pelvis demonstrates
no hypermetabolic activity and may reflect a peritoneal cyst or
other extraovarian process.  However, cystic neoplasm of the ovary
is difficult to completely exclude, and pelvic ultrasound
correlation is recommended.

## 2009-05-06 ENCOUNTER — Ambulatory Visit: Payer: Self-pay | Admitting: Internal Medicine

## 2009-05-11 ENCOUNTER — Ambulatory Visit: Payer: Self-pay | Admitting: Internal Medicine

## 2009-05-11 ENCOUNTER — Telehealth: Payer: Self-pay | Admitting: Internal Medicine

## 2009-05-25 ENCOUNTER — Ambulatory Visit: Payer: Self-pay | Admitting: Internal Medicine

## 2009-06-04 ENCOUNTER — Encounter: Payer: Self-pay | Admitting: Internal Medicine

## 2009-06-04 ENCOUNTER — Ambulatory Visit (HOSPITAL_COMMUNITY): Admission: RE | Admit: 2009-06-04 | Discharge: 2009-06-04 | Payer: Self-pay | Admitting: Oncology

## 2009-06-11 ENCOUNTER — Ambulatory Visit (HOSPITAL_COMMUNITY): Admission: RE | Admit: 2009-06-11 | Discharge: 2009-06-11 | Payer: Self-pay | Admitting: Oncology

## 2009-06-15 ENCOUNTER — Telehealth: Payer: Self-pay | Admitting: Internal Medicine

## 2009-06-20 HISTORY — PX: OTHER SURGICAL HISTORY: SHX169

## 2009-07-03 ENCOUNTER — Telehealth: Payer: Self-pay | Admitting: Internal Medicine

## 2009-07-14 IMAGING — CT CT CHEST W/ CM
2 of 3 series · 15 of 36 positions shown, 18 images · IV contrast (omniscan)
Comparison: PET CT 09/18/2007, chest CT 09/11/2007

CLINICAL DATA: Prior right upper lobectomy for lung cancer.  Follow
up left lower lobe lung mass which was not hypermetabolic on PET CT
following abnormal chest CT 09/11/2007

CT CHEST WITH CONTRAST
TECHNIQUE: Multidetector CT imaging of the chest was performed
following the standard protocol during bolus administration of
intravenous contrast.
Contrast: 80 ml Omniscan 300 IV contrast

[Series 2: chest routine 5.0 b40f · axial · 0.67mm/px · z∈[-359,-54]mm · 12 of 73 slices shown, 15 images]
[im 6/73  mediastinal]
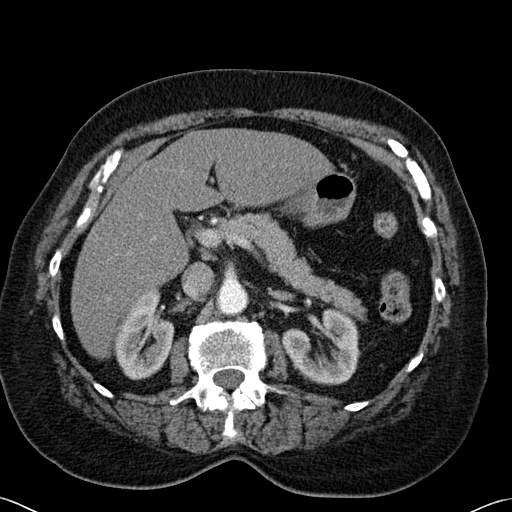
[im 6/73  lung]
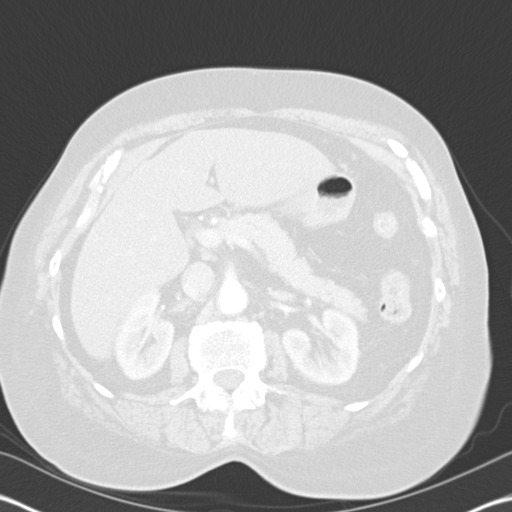
[im 11/73  lung]
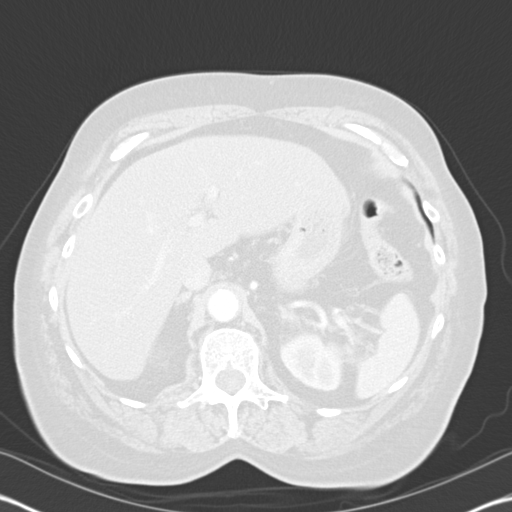
[im 17/73  lung]
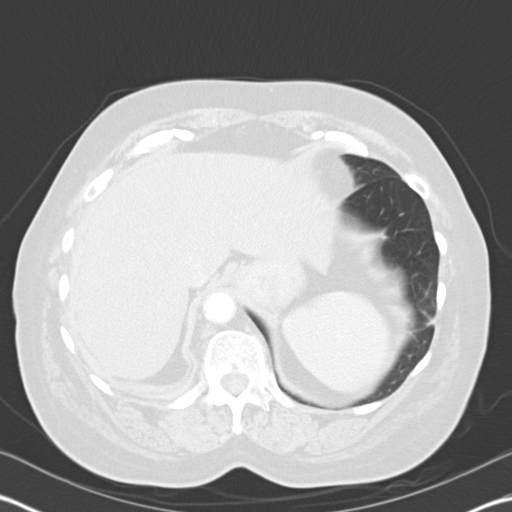
[im 22/73  lung]
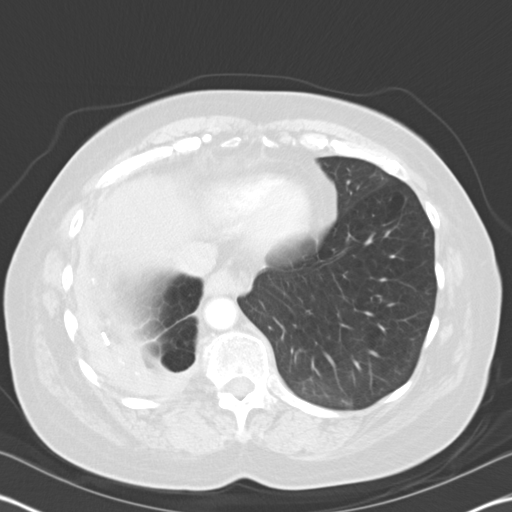
[im 27/73  mediastinal]
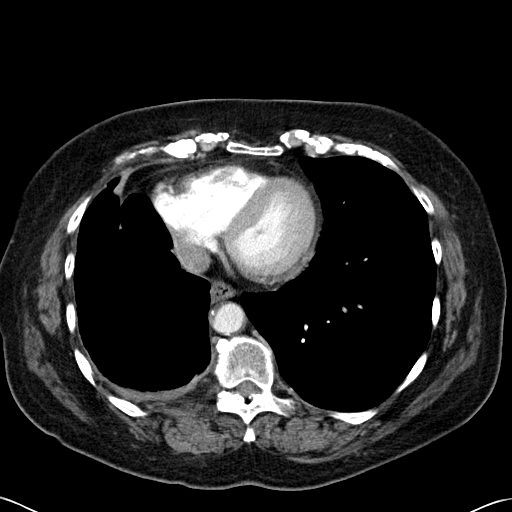
[im 27/73  lung]
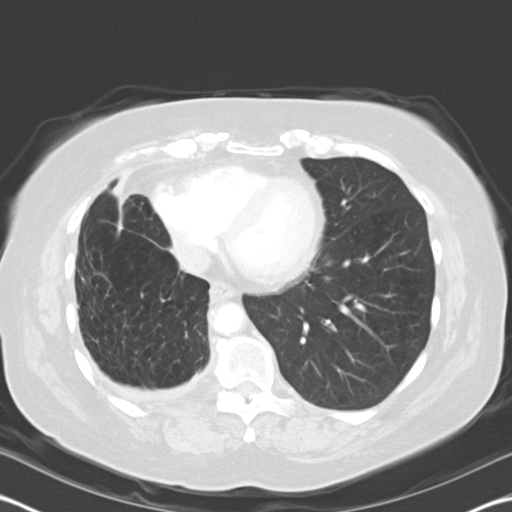
[im 33/73  lung]
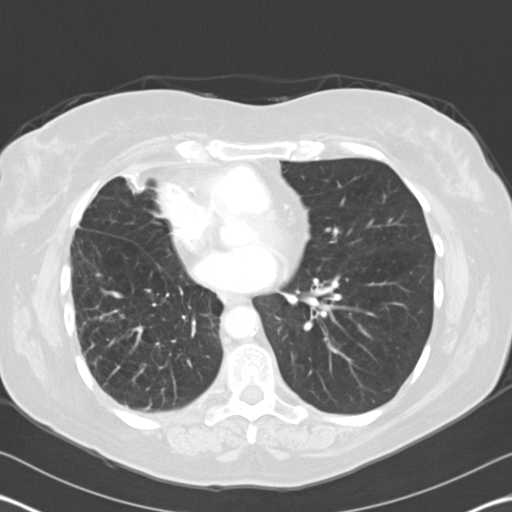
[im 41/73  lung]
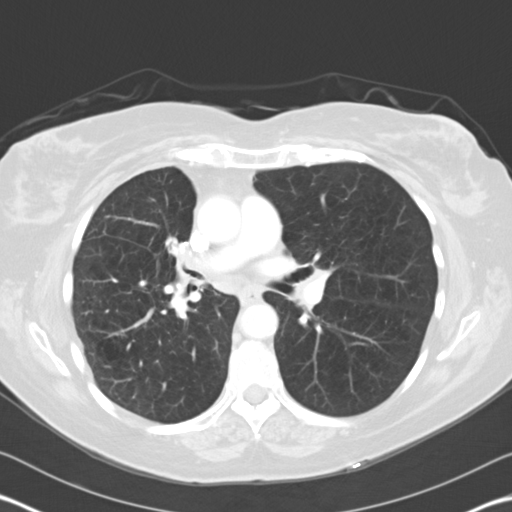
[im 46/73  lung]
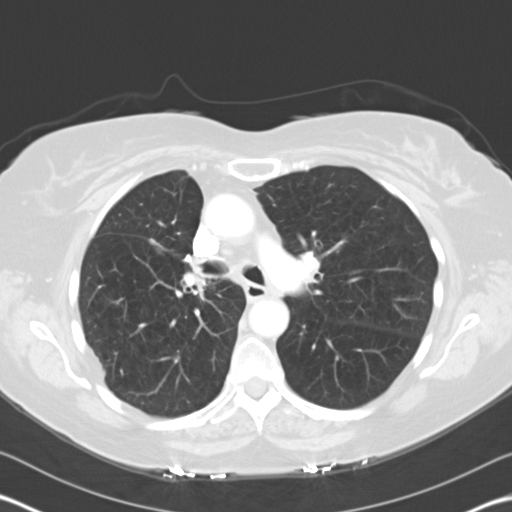
[im 51/73  mediastinal]
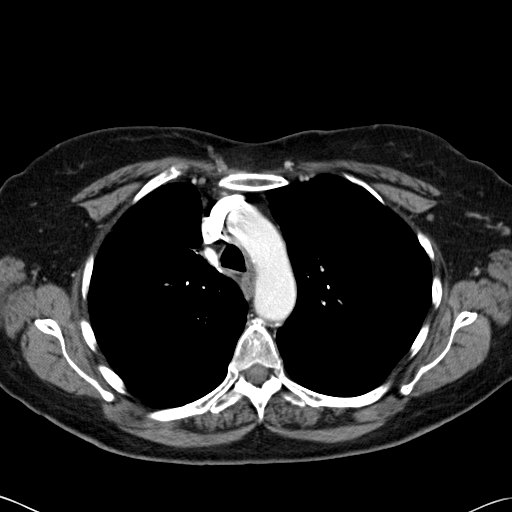
[im 51/73  lung]
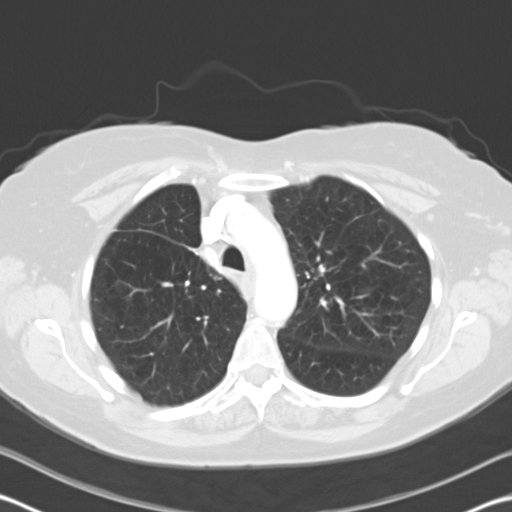
[im 57/73  lung]
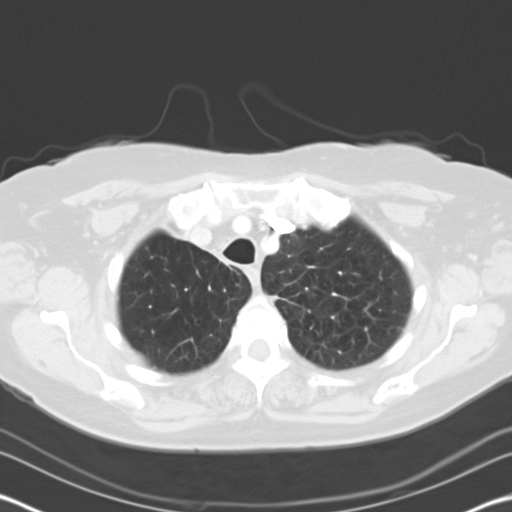
[im 62/73  lung]
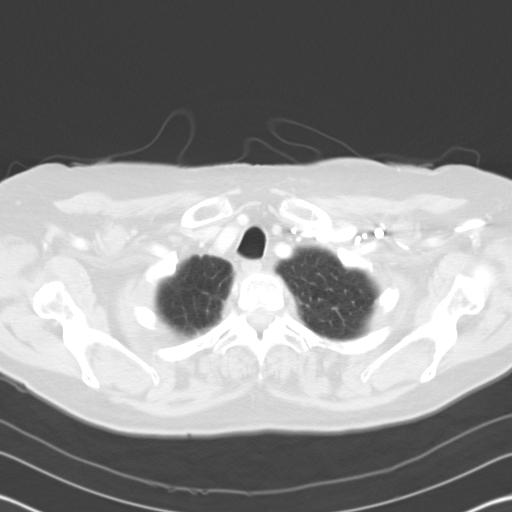
[im 67/73  lung]
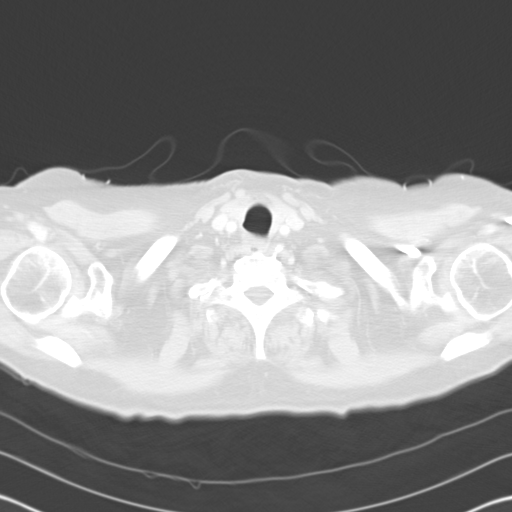

[Series 602: <mpr thick range> · coronal · 0.71mm/px · 3 of 83 slices shown]
[im 17/83  lung]
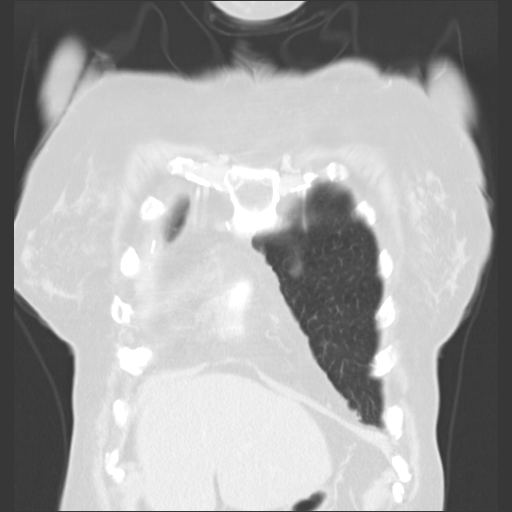
[im 33/83  lung]
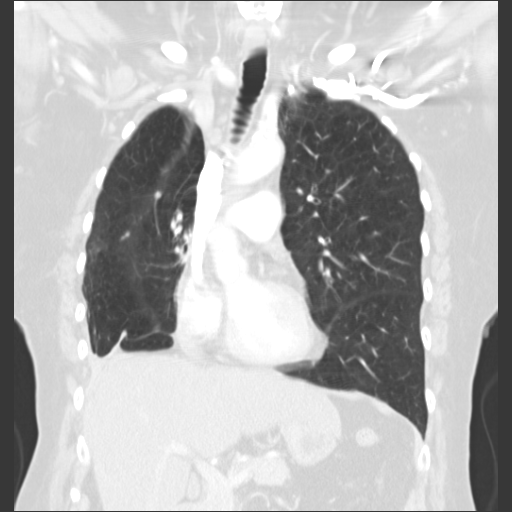
[im 50/83  lung]
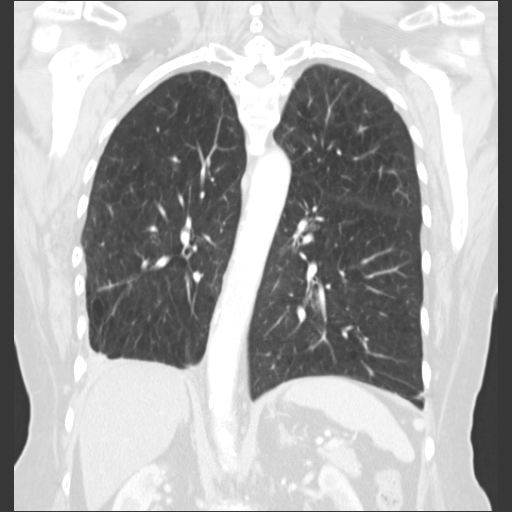

[15 of 36 positions shown; findings below may reference images not displayed]

FINDINGS: The previously seen mass like pleural parenchymal nodular
opacity in the left lower lobe has nearly resolved, with only
residual plate like opacity.  Emphysematous change is identified.
Postoperative changes are noted in the right hemithorax.  1.0 x
cm nodule abutting the right major fissure is stable.  Central
airways are patent.

Heart size is normal.  No pericardial or pleural effusion.  No
mediastinal or hilar lymphadenopathy.  Great vessels are normal
caliber.

No lytic or sclerotic osseous lesion identified.  Disc degenerative
change identified.
IMPRESSION: Near complete interval resolution of previously seen left lower
lobe mass like opacity, likely reflecting atelectasis or resolved
infectious/inflammatory etiology.

Right-sided postsurgical change, stable nodule abutting the major
fissure.

## 2009-07-15 ENCOUNTER — Telehealth: Payer: Self-pay | Admitting: Internal Medicine

## 2009-07-15 DIAGNOSIS — J984 Other disorders of lung: Secondary | ICD-10-CM

## 2009-07-22 ENCOUNTER — Encounter: Payer: Self-pay | Admitting: Internal Medicine

## 2009-07-22 ENCOUNTER — Ambulatory Visit: Admission: RE | Admit: 2009-07-22 | Discharge: 2009-07-22 | Payer: Self-pay | Admitting: Gynecologic Oncology

## 2009-07-23 ENCOUNTER — Ambulatory Visit (HOSPITAL_COMMUNITY): Admission: RE | Admit: 2009-07-23 | Discharge: 2009-07-23 | Payer: Self-pay | Admitting: Gynecologic Oncology

## 2009-07-30 ENCOUNTER — Ambulatory Visit: Payer: Self-pay | Admitting: Internal Medicine

## 2009-07-30 LAB — CONVERTED CEMR LAB
Blood in Urine, dipstick: NEGATIVE
Glucose, Urine, Semiquant: NEGATIVE
Ketones, urine, test strip: NEGATIVE
Nitrite: NEGATIVE
Protein, U semiquant: NEGATIVE
Urobilinogen, UA: 0.2

## 2009-08-06 ENCOUNTER — Encounter: Admission: RE | Admit: 2009-08-06 | Discharge: 2009-08-06 | Payer: Self-pay | Admitting: Internal Medicine

## 2009-08-10 ENCOUNTER — Telehealth: Payer: Self-pay | Admitting: Internal Medicine

## 2009-08-10 DIAGNOSIS — N312 Flaccid neuropathic bladder, not elsewhere classified: Secondary | ICD-10-CM

## 2009-09-04 ENCOUNTER — Encounter: Payer: Self-pay | Admitting: Internal Medicine

## 2009-09-11 ENCOUNTER — Encounter: Payer: Self-pay | Admitting: Internal Medicine

## 2009-09-11 ENCOUNTER — Encounter: Admission: RE | Admit: 2009-09-11 | Discharge: 2009-09-11 | Payer: Self-pay | Admitting: Urology

## 2009-09-29 ENCOUNTER — Encounter: Payer: Self-pay | Admitting: Internal Medicine

## 2009-09-30 ENCOUNTER — Encounter: Admission: RE | Admit: 2009-09-30 | Discharge: 2009-09-30 | Payer: Self-pay | Admitting: Internal Medicine

## 2009-10-01 ENCOUNTER — Ambulatory Visit: Payer: Self-pay | Admitting: Internal Medicine

## 2009-10-01 DIAGNOSIS — J4489 Other specified chronic obstructive pulmonary disease: Secondary | ICD-10-CM | POA: Insufficient documentation

## 2009-10-01 DIAGNOSIS — J449 Chronic obstructive pulmonary disease, unspecified: Secondary | ICD-10-CM

## 2009-10-07 ENCOUNTER — Telehealth (INDEPENDENT_AMBULATORY_CARE_PROVIDER_SITE_OTHER): Payer: Self-pay | Admitting: *Deleted

## 2009-10-20 ENCOUNTER — Encounter: Payer: Self-pay | Admitting: Internal Medicine

## 2009-10-30 ENCOUNTER — Ambulatory Visit: Payer: Self-pay | Admitting: Internal Medicine

## 2009-10-30 LAB — CONVERTED CEMR LAB
AST: 33 units/L (ref 0–37)
Albumin: 3.4 g/dL — ABNORMAL LOW (ref 3.5–5.2)
Alkaline Phosphatase: 102 units/L (ref 39–117)
Basophils Relative: 1 % (ref 0.0–3.0)
CO2: 33 meq/L — ABNORMAL HIGH (ref 19–32)
Calcium: 9.8 mg/dL (ref 8.4–10.5)
Eosinophils Relative: 1.7 % (ref 0.0–5.0)
GFR calc non Af Amer: 78.95 mL/min (ref >60)
HDL: 74.6 mg/dL (ref >39.00)
Hemoglobin: 13.1 g/dL (ref 12.0–15.0)
Lymphocytes Relative: 22.1 % (ref 12.0–46.0)
Monocytes Relative: 9.2 % (ref 3.0–12.0)
Neutro Abs: 5.1 10*3/uL (ref 1.4–7.7)
RBC: 3.91 M/uL (ref 3.87–5.11)
Sodium: 138 meq/L (ref 135–145)
Total CHOL/HDL Ratio: 2
Total Protein: 7.1 g/dL (ref 6.0–8.3)

## 2009-11-01 ENCOUNTER — Encounter: Payer: Self-pay | Admitting: Internal Medicine

## 2009-11-06 ENCOUNTER — Ambulatory Visit: Payer: Self-pay | Admitting: Internal Medicine

## 2009-11-13 ENCOUNTER — Telehealth: Payer: Self-pay | Admitting: Internal Medicine

## 2009-11-17 ENCOUNTER — Ambulatory Visit: Payer: Self-pay | Admitting: Internal Medicine

## 2009-11-24 ENCOUNTER — Encounter: Payer: Self-pay | Admitting: Internal Medicine

## 2009-11-30 IMAGING — CR DG CHEST 2V
2 series · 2 of 2 positions shown · non-contrast
Comparison: CT chest 12/20/2007 and CT chest 09/11/2007

CLINICAL DATA: Cough, chest pain, shortness of breath and
hemoptysis.  History lung cancer.

CHEST - 2 VIEW

[view not recorded (1 of 2)]
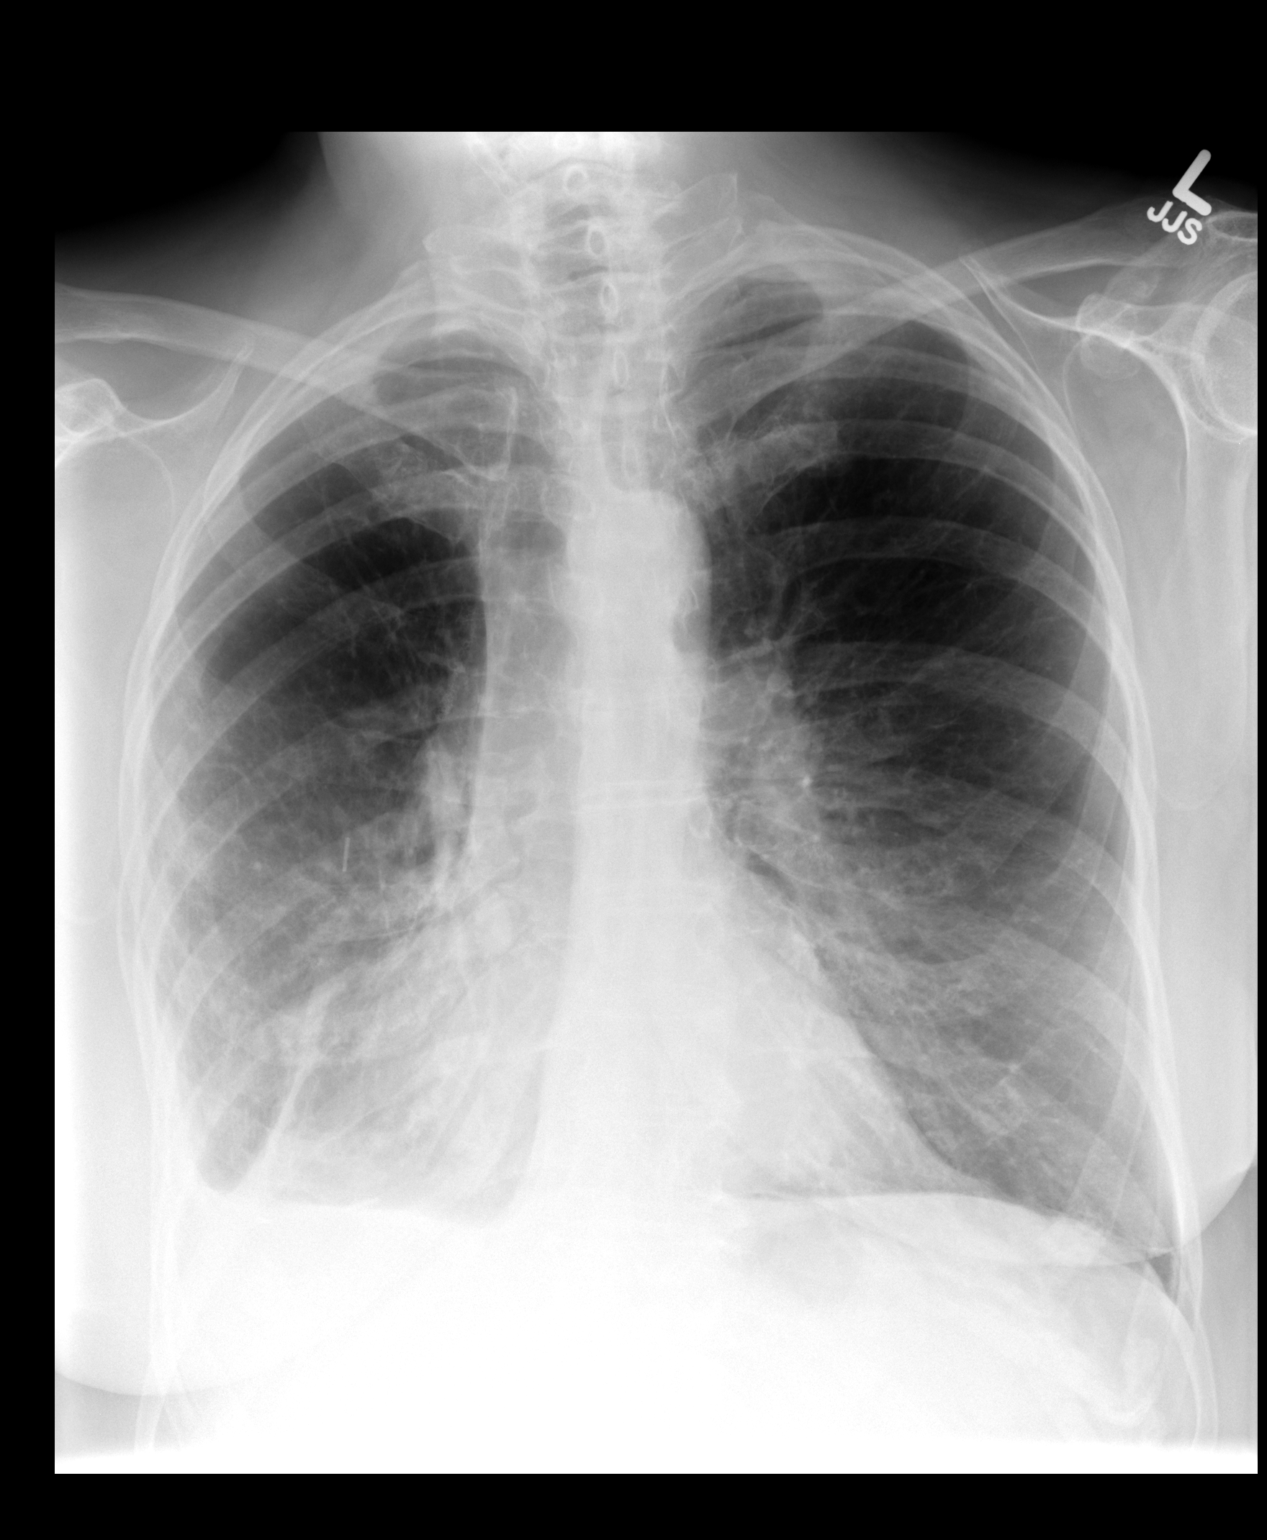

[view not recorded (2 of 2)]
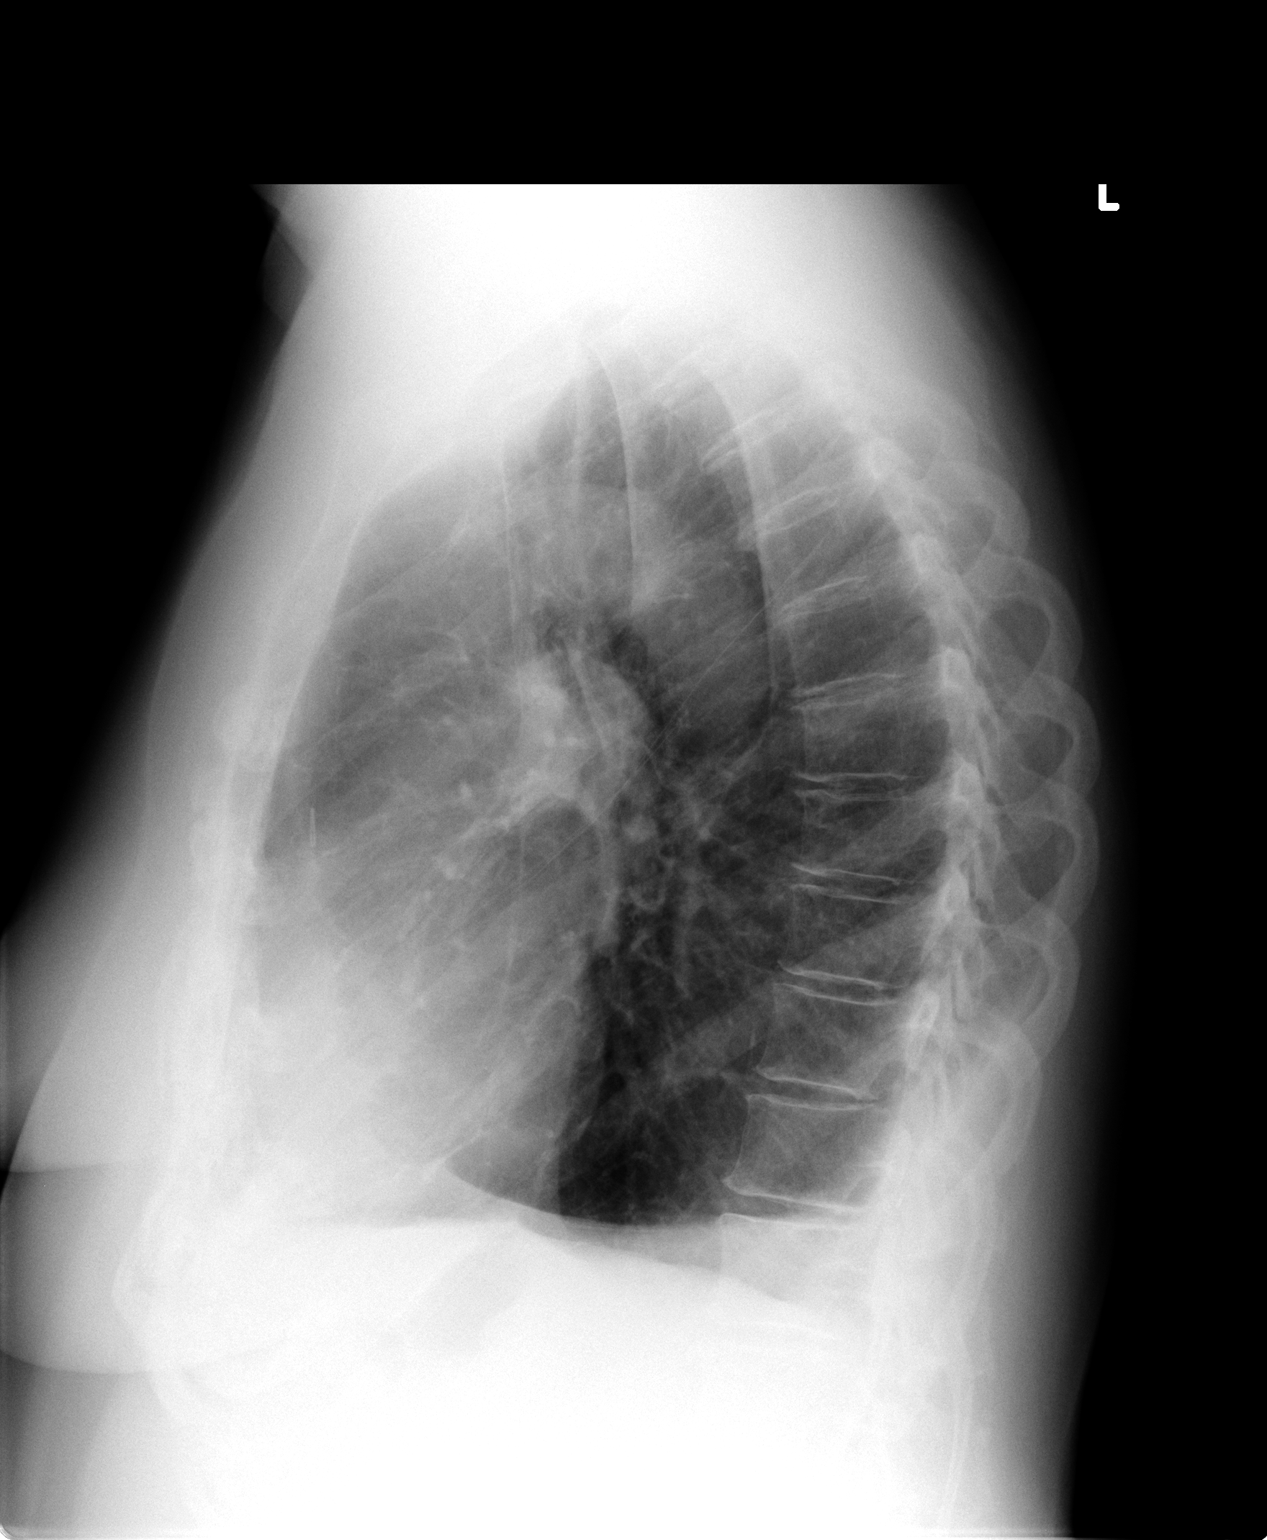

[2 of 2 positions shown; findings below may reference images not displayed]

FINDINGS: Trachea is midline, given slight patient rotation.
Postoperative changes of right upper lobectomy are seen.  When
compared with CT chest 12/20/2007, there is increased opacity in
the right middle lobe.  Lungs are emphysematous.  No pleural fluid.
IMPRESSION: 1.  Suspect right middle lobe air space disease superimposed on
postoperative changes of right upper lobectomy and emphysema.

## 2009-12-15 IMAGING — CR DG CHEST 2V
2 series · 2 of 2 positions shown · non-contrast
Comparison: 05/07/2008

CLINICAL DATA: Right side chest pain.  Cough.  History of surgery
for lung CA.  Previous smoker.  COPD.

CHEST - 2 VIEW

[view not recorded (1 of 2)]
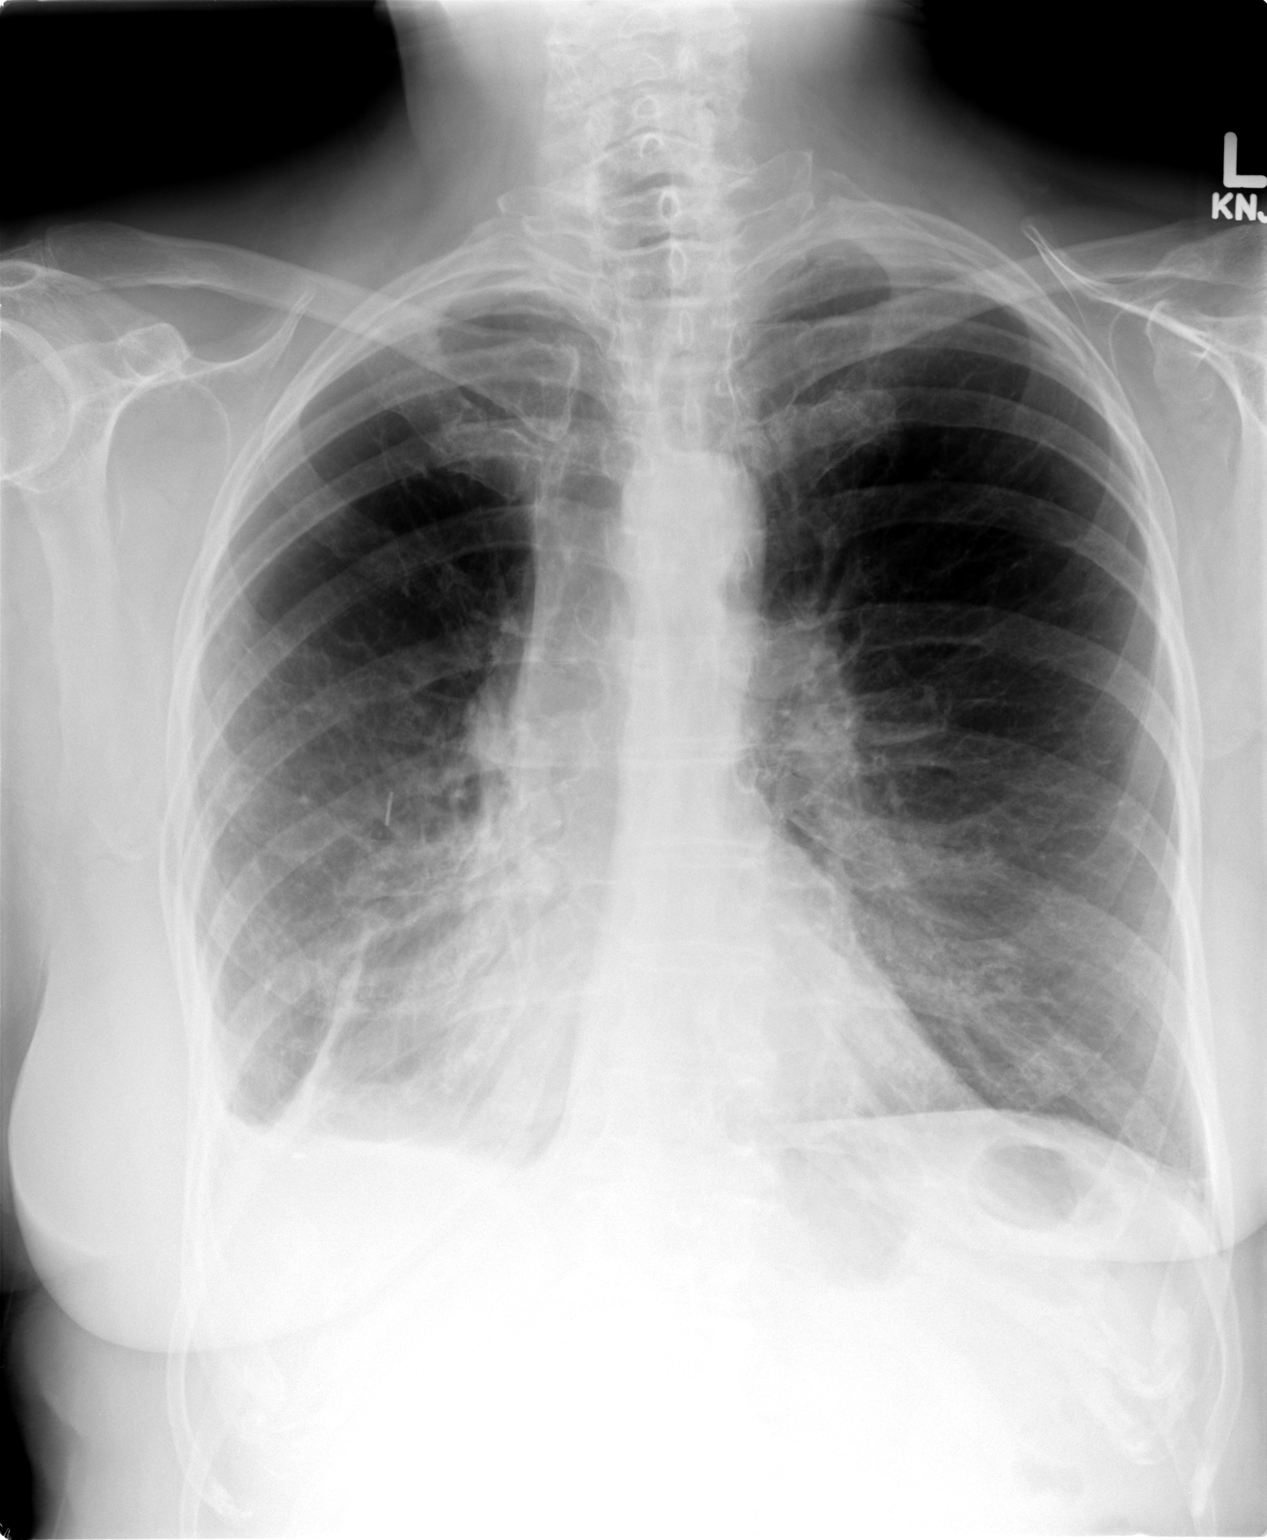

[view not recorded (2 of 2)]
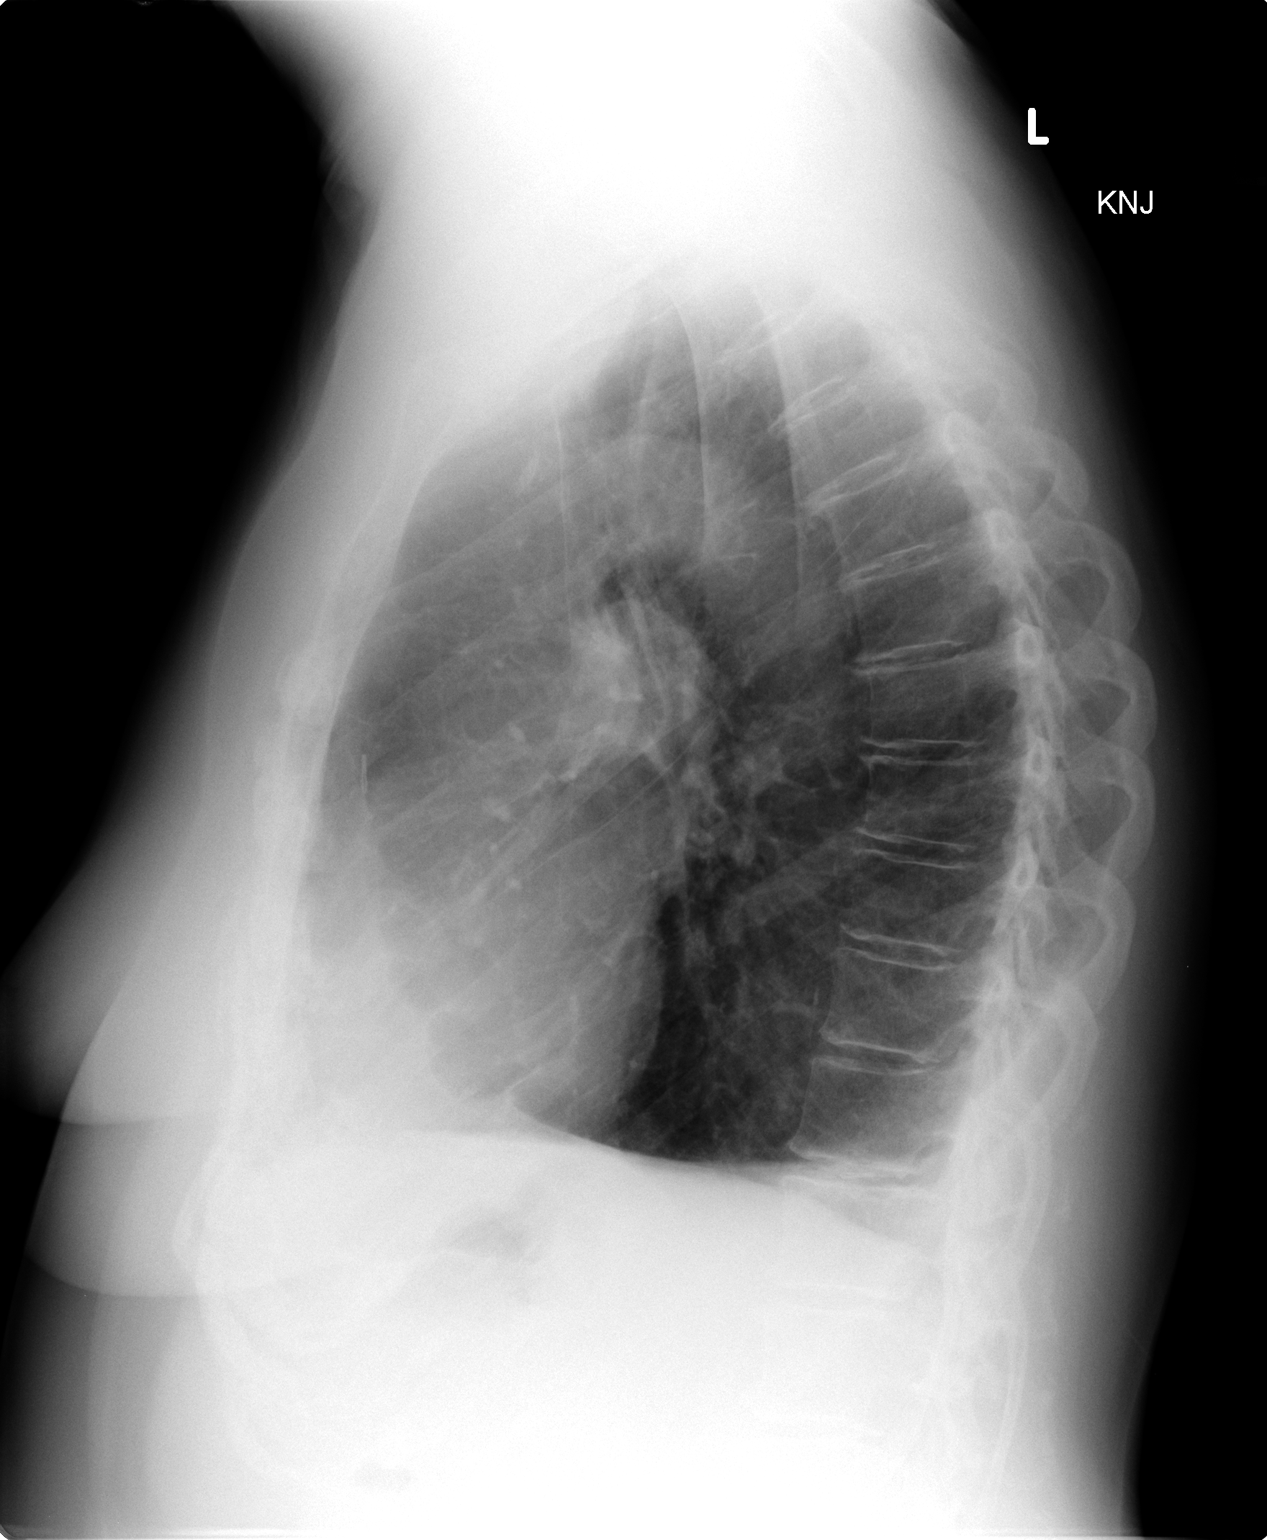

[2 of 2 positions shown; findings below may reference images not displayed]

FINDINGS: Pleuroparenchymal scarring on the right.  Improved
aeration of the lungs since 05/07/2008.  No acute chest findings.
Blunting of the right costophrenic angle is compatible with pleural
scarring/thickening.
IMPRESSION: No acute chest findings.  Chronic pleuroparenchymal changes of the
right lower chest.

## 2009-12-17 ENCOUNTER — Ambulatory Visit: Payer: Self-pay | Admitting: Oncology

## 2009-12-22 ENCOUNTER — Encounter: Payer: Self-pay | Admitting: Internal Medicine

## 2009-12-22 ENCOUNTER — Ambulatory Visit (HOSPITAL_COMMUNITY): Admission: RE | Admit: 2009-12-22 | Discharge: 2009-12-22 | Payer: Self-pay | Admitting: Oncology

## 2009-12-22 LAB — CBC WITH DIFFERENTIAL/PLATELET
Basophils Absolute: 0 10*3/uL (ref 0.0–0.1)
Eosinophils Absolute: 0 10*3/uL (ref 0.0–0.5)
HCT: 38 % (ref 34.8–46.6)
HGB: 12.8 g/dL (ref 11.6–15.9)
MONO#: 0.5 10*3/uL (ref 0.1–0.9)
NEUT%: 50.9 % (ref 38.4–76.8)
WBC: 5.2 10*3/uL (ref 3.9–10.3)
lymph#: 2 10*3/uL (ref 0.9–3.3)

## 2009-12-22 LAB — COMPREHENSIVE METABOLIC PANEL
ALT: 23 U/L (ref 0–35)
BUN: 7 mg/dL (ref 6–23)
CO2: 28 mEq/L (ref 19–32)
Calcium: 10.2 mg/dL (ref 8.4–10.5)
Chloride: 98 mEq/L (ref 96–112)
Creatinine, Ser: 0.75 mg/dL (ref 0.40–1.20)

## 2009-12-22 LAB — LACTATE DEHYDROGENASE: LDH: 213 U/L (ref 94–250)

## 2009-12-29 ENCOUNTER — Encounter: Payer: Self-pay | Admitting: Internal Medicine

## 2010-01-12 ENCOUNTER — Telehealth: Payer: Self-pay | Admitting: Internal Medicine

## 2010-01-18 IMAGING — CT CT CHEST W/ CM
1 of 2 series · 14 of 32 positions shown, 18 images · IV contrast (agent unspecified)
Comparison: Plain film 05/22/2008.  Chest CT 12/20/2007.  PET
09/18/2007.

CLINICAL DATA: Lung cancer.  Chronic shortness of breath and cough.
Right lung resection only.

CT CHEST WITH CONTRAST
TECHNIQUE: Multidetector CT imaging of the chest was performed
following the standard protocol during bolus administration of
intravenous contrast.
Contrast: 80 ml Umnipaque-KHH

[Series 2: chest_routine 5.0 b40f st · axial · 0.79mm/px · z∈[-342,-37]mm · 14 of 73 slices shown, 18 images]
[im 6/73  mediastinal]
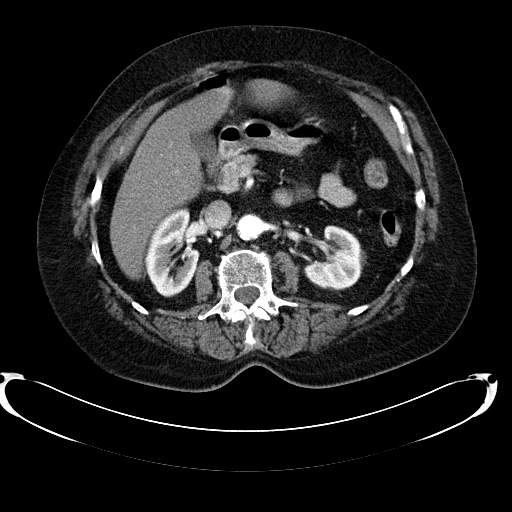
[im 6/73  lung]
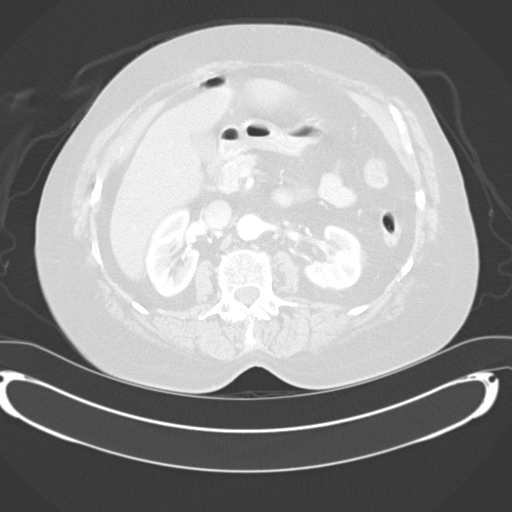
[im 12/73  lung]
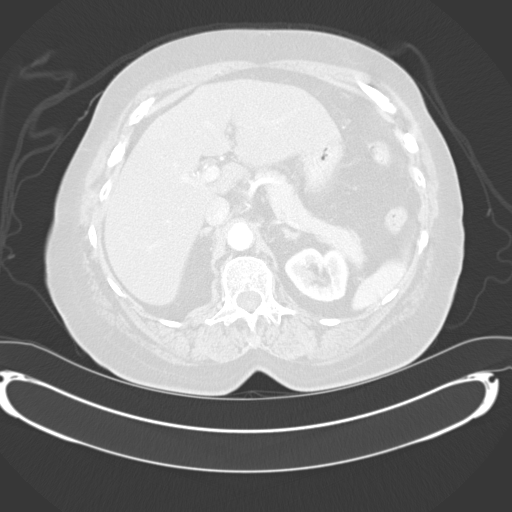
[im 17/73  lung]
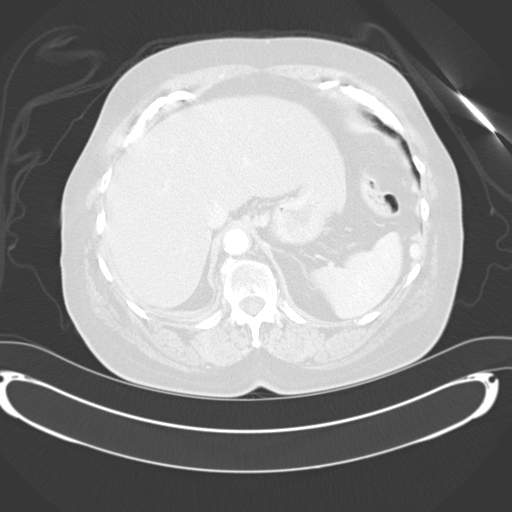
[im 23/73  lung]
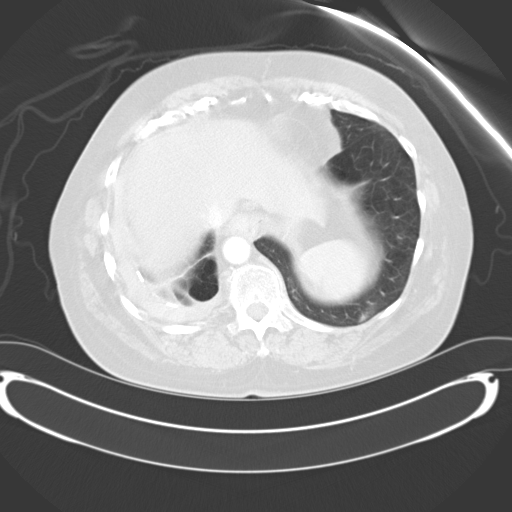
[im 28/73  mediastinal]
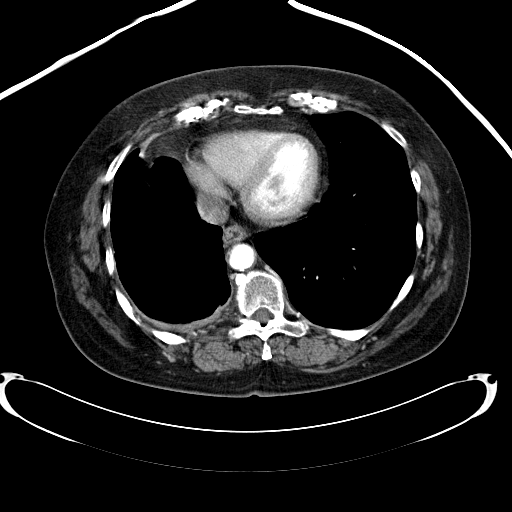
[im 28/73  lung]
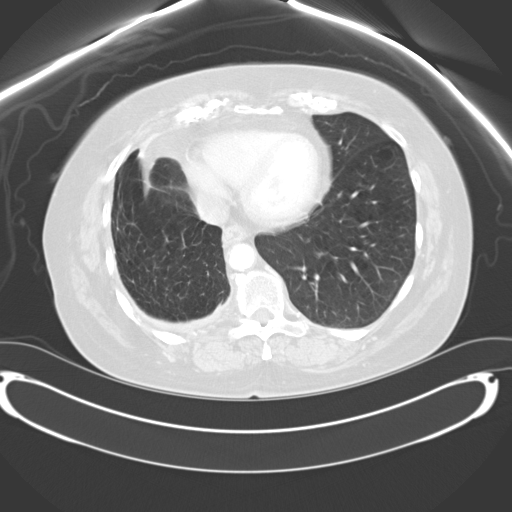
[im 34/73  lung]
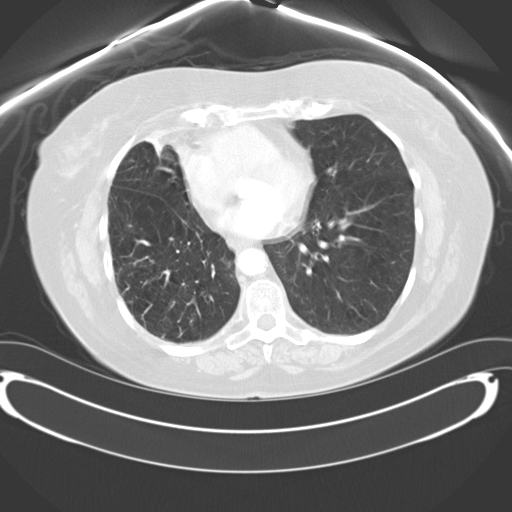
[im 36/73  lung]
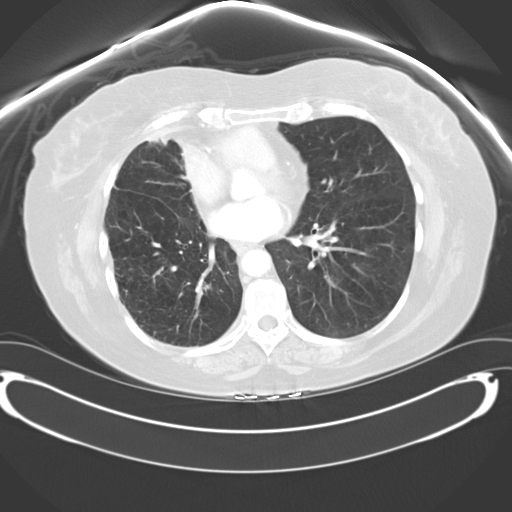
[im 37/73  lung]
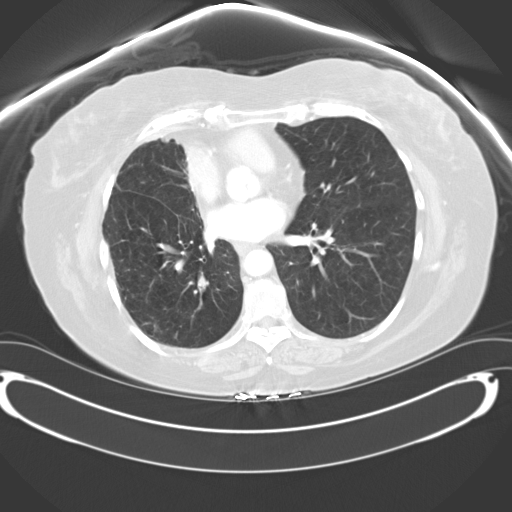
[im 39/73  mediastinal]
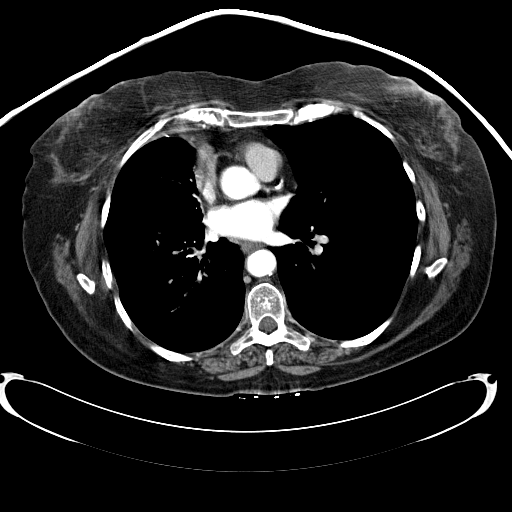
[im 39/73  lung]
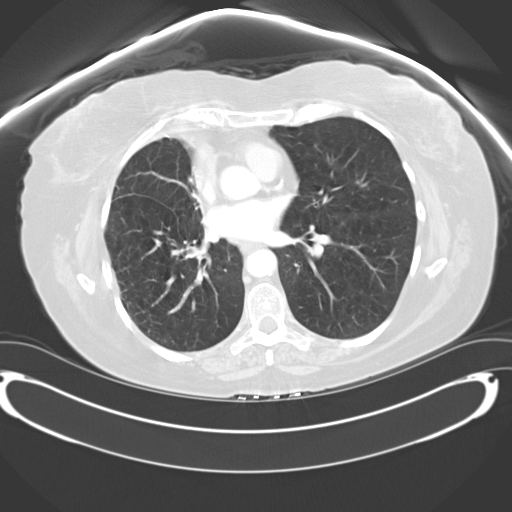
[im 45/73  lung]
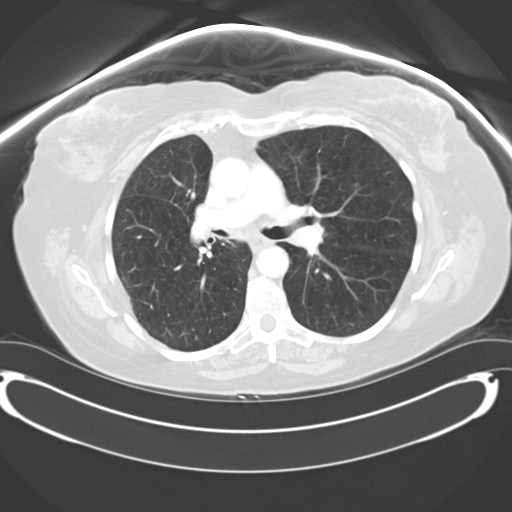
[im 50/73  lung]
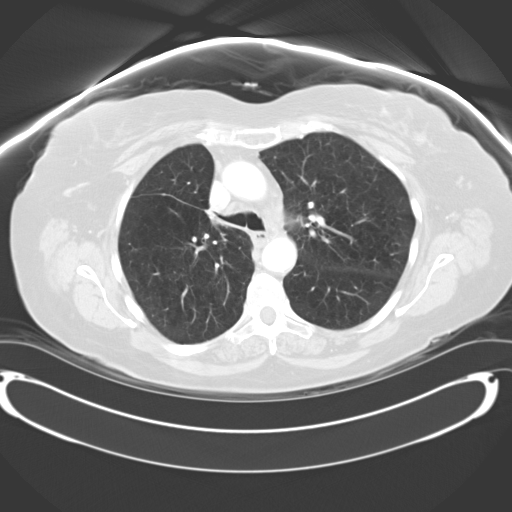
[im 56/73  lung]
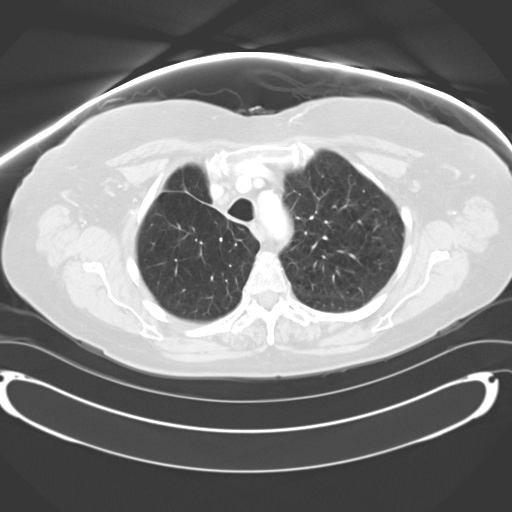
[im 61/73  mediastinal]
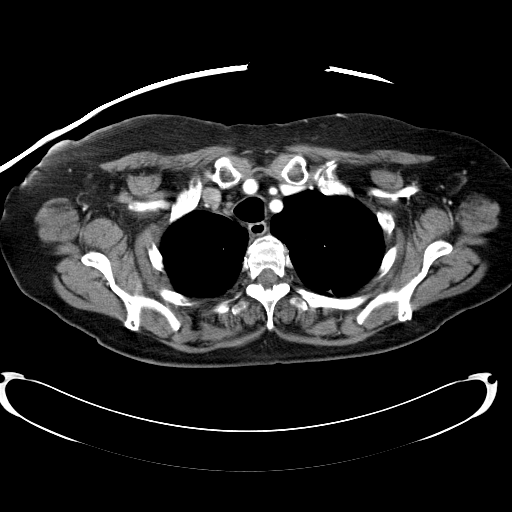
[im 61/73  lung]
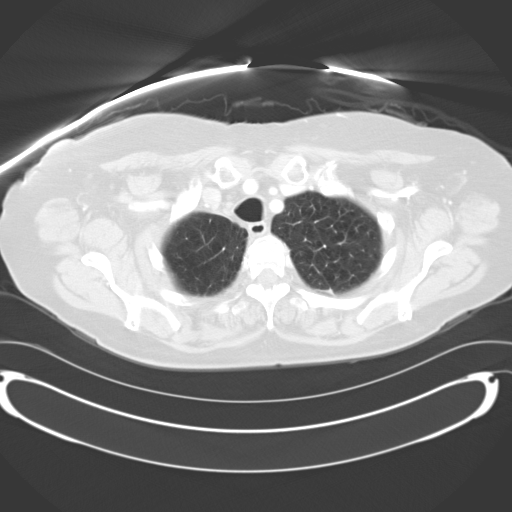
[im 67/73  lung]
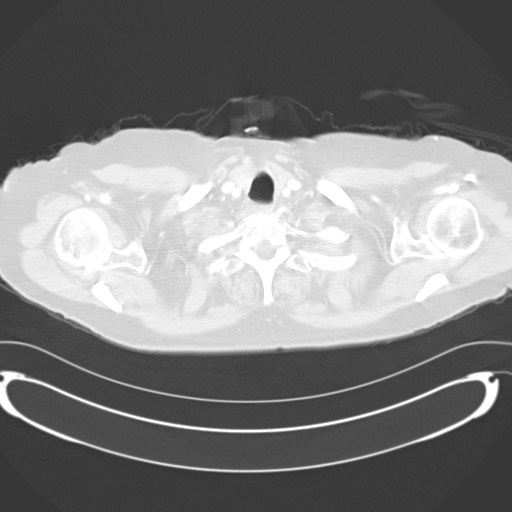

[14 of 32 positions shown; findings below may reference images not displayed]

FINDINGS: Lung windows demonstrate moderate centrilobular
emphysema.  Surgical sutures at the right upper lobe.

8 x 8 mm nodule along the right major fissure is similar to 1.0 x
0.6 on the prior exam.  Image 27.
Minimal subpleural irregularity in the posterior right lung on
image 23 which is stable.
Patchy areas of scarring in the left lower lobe are unchanged.
Superior segment left lower lobe subpleural nodule, stable on image
32 at approximately 2 mm.

Soft tissue windows demonstrate normal heart size.  No pericardial
or left-sided pleural effusion.  LAD coronary artery
atherosclerosis.

Small right-sided pleural effusion which is unchanged.  Small
mediastinal lymph nodes which are stable. No retroperitoneal or
retrocrural adenopathy. Tiny hiatal hernia.

Limited abdominal imaging demonstrates moderate fatty infiltration
of the liver.  Subtle hypoattenuation adjacent the falciform
ligament within the medial segment left liver lobe on image 64.
Felt to be similar to on the 05/28/2007 exam and may represent a
too small to characterize lesion or an area of focal fat
deposition.

Numerous right-sided renal arteries.  Retroaortic left renal vein
which is a normal anatomic variant.  Normal adrenal glands.
IMPRESSION: 1. No acute process or evidence of metastatic disease in the chest.
2.  Surgical changes in the right lung with a perifissural stable
nodule.
3.  Fatty infiltration of the liver with a left hepatic lobe too
small to characterize lesion versus focal fat deposition.  Felt to
be similar over prior exams. Recommend attention on follow-up.

## 2010-02-05 ENCOUNTER — Ambulatory Visit (HOSPITAL_COMMUNITY): Admission: RE | Admit: 2010-02-05 | Discharge: 2010-02-05 | Payer: Self-pay | Admitting: Gynecologic Oncology

## 2010-02-08 ENCOUNTER — Telehealth: Payer: Self-pay | Admitting: Internal Medicine

## 2010-02-10 ENCOUNTER — Ambulatory Visit: Payer: Self-pay | Admitting: Internal Medicine

## 2010-02-10 LAB — CONVERTED CEMR LAB
ALT: 30 units/L (ref 0–35)
Albumin: 3.6 g/dL (ref 3.5–5.2)
Alkaline Phosphatase: 89 units/L (ref 39–117)
Basophils Relative: 0.4 % (ref 0.0–3.0)
Bilirubin Urine: NEGATIVE
CO2: 32 meq/L (ref 19–32)
Calcium: 9.7 mg/dL (ref 8.4–10.5)
Chloride: 94 meq/L — ABNORMAL LOW (ref 96–112)
Direct LDL: 112.2 mg/dL
Eosinophils Absolute: 0 10*3/uL (ref 0.0–0.7)
Eosinophils Relative: 0.3 % (ref 0.0–5.0)
Hemoglobin, Urine: NEGATIVE
Hemoglobin: 12.6 g/dL (ref 12.0–15.0)
Ketones, ur: NEGATIVE mg/dL
Lymphocytes Relative: 23 % (ref 12.0–46.0)
MCHC: 34.3 g/dL (ref 30.0–36.0)
MCV: 100.2 fL — ABNORMAL HIGH (ref 78.0–100.0)
Neutro Abs: 5.9 10*3/uL (ref 1.4–7.7)
Nitrite: NEGATIVE
RBC: 3.68 M/uL — ABNORMAL LOW (ref 3.87–5.11)
Sodium: 135 meq/L (ref 135–145)
Total CHOL/HDL Ratio: 3
Total Protein, Urine: NEGATIVE mg/dL
Total Protein: 7 g/dL (ref 6.0–8.3)
Urine Glucose: NEGATIVE mg/dL
WBC: 8.5 10*3/uL (ref 4.5–10.5)
pH: 7 (ref 5.0–8.0)

## 2010-02-11 ENCOUNTER — Encounter: Payer: Self-pay | Admitting: Internal Medicine

## 2010-03-01 ENCOUNTER — Encounter: Payer: Self-pay | Admitting: Internal Medicine

## 2010-03-09 ENCOUNTER — Encounter: Payer: Self-pay | Admitting: Internal Medicine

## 2010-03-16 ENCOUNTER — Ambulatory Visit: Payer: Self-pay | Admitting: Internal Medicine

## 2010-03-17 ENCOUNTER — Telehealth (INDEPENDENT_AMBULATORY_CARE_PROVIDER_SITE_OTHER): Payer: Self-pay | Admitting: *Deleted

## 2010-03-29 ENCOUNTER — Telehealth (INDEPENDENT_AMBULATORY_CARE_PROVIDER_SITE_OTHER): Payer: Self-pay | Admitting: *Deleted

## 2010-03-31 ENCOUNTER — Encounter: Admission: RE | Admit: 2010-03-31 | Discharge: 2010-03-31 | Payer: Self-pay | Admitting: Internal Medicine

## 2010-03-31 LAB — HM MAMMOGRAPHY

## 2010-05-06 ENCOUNTER — Ambulatory Visit: Payer: Self-pay | Admitting: Internal Medicine

## 2010-05-06 LAB — CONVERTED CEMR LAB
AST: 47 units/L — ABNORMAL HIGH (ref 0–37)
Albumin: 3.6 g/dL (ref 3.5–5.2)
Alkaline Phosphatase: 89 units/L (ref 39–117)
Bilirubin Urine: NEGATIVE
Bilirubin, Direct: 0.1 mg/dL (ref 0.0–0.3)
CO2: 30 meq/L (ref 19–32)
Chloride: 94 meq/L — ABNORMAL LOW (ref 96–112)
Glucose, Bld: 101 mg/dL — ABNORMAL HIGH (ref 70–99)
Hemoglobin, Urine: NEGATIVE
Nitrite: NEGATIVE
Potassium: 4.4 meq/L (ref 3.5–5.1)
Sodium: 134 meq/L — ABNORMAL LOW (ref 135–145)
Total Protein, Urine: NEGATIVE mg/dL
Total Protein: 7.1 g/dL (ref 6.0–8.3)
Urine Glucose: NEGATIVE mg/dL
pH: 7.5 (ref 5.0–8.0)

## 2010-05-07 ENCOUNTER — Encounter: Payer: Self-pay | Admitting: Internal Medicine

## 2010-05-26 ENCOUNTER — Telehealth: Payer: Self-pay | Admitting: Internal Medicine

## 2010-05-31 ENCOUNTER — Telehealth: Payer: Self-pay | Admitting: Internal Medicine

## 2010-06-16 ENCOUNTER — Ambulatory Visit: Payer: Self-pay | Admitting: Oncology

## 2010-06-22 ENCOUNTER — Ambulatory Visit (HOSPITAL_COMMUNITY)
Admission: RE | Admit: 2010-06-22 | Discharge: 2010-06-22 | Payer: Self-pay | Source: Home / Self Care | Attending: Oncology | Admitting: Oncology

## 2010-06-22 LAB — COMPREHENSIVE METABOLIC PANEL
ALT: 23 U/L (ref 0–35)
AST: 47 U/L — ABNORMAL HIGH (ref 0–37)
Albumin: 4.1 g/dL (ref 3.5–5.2)
Alkaline Phosphatase: 74 U/L (ref 39–117)
BUN: 8 mg/dL (ref 6–23)
CO2: 31 mEq/L (ref 19–32)
Calcium: 10.9 mg/dL — ABNORMAL HIGH (ref 8.4–10.5)
Chloride: 97 mEq/L (ref 96–112)
Creatinine, Ser: 0.89 mg/dL (ref 0.40–1.20)
Glucose, Bld: 108 mg/dL — ABNORMAL HIGH (ref 70–99)
Potassium: 4.3 mEq/L (ref 3.5–5.3)
Sodium: 137 mEq/L (ref 135–145)
Total Bilirubin: 0.6 mg/dL (ref 0.3–1.2)
Total Protein: 7.6 g/dL (ref 6.0–8.3)

## 2010-06-22 LAB — LACTATE DEHYDROGENASE: LDH: 178 U/L (ref 94–250)

## 2010-06-22 LAB — CBC WITH DIFFERENTIAL/PLATELET
BASO%: 0.2 % (ref 0.0–2.0)
Basophils Absolute: 0 10*3/uL (ref 0.0–0.1)
EOS%: 0.4 % (ref 0.0–7.0)
Eosinophils Absolute: 0 10*3/uL (ref 0.0–0.5)
LYMPH%: 24.7 % (ref 14.0–49.7)
MCHC: 34 g/dL (ref 31.5–36.0)
WBC: 7.1 10*3/uL (ref 3.9–10.3)

## 2010-06-24 ENCOUNTER — Encounter: Payer: Self-pay | Admitting: Internal Medicine

## 2010-06-28 ENCOUNTER — Telehealth: Payer: Self-pay | Admitting: Internal Medicine

## 2010-06-29 ENCOUNTER — Encounter: Payer: Self-pay | Admitting: Internal Medicine

## 2010-06-30 LAB — PTH, INTACT AND CALCIUM
Calcium, Total (PTH): 9.9 mg/dL (ref 8.4–10.5)
PTH: 66.1 pg/mL (ref 14.0–72.0)

## 2010-07-10 ENCOUNTER — Other Ambulatory Visit: Payer: Self-pay | Admitting: Oncology

## 2010-07-10 DIAGNOSIS — Z85118 Personal history of other malignant neoplasm of bronchus and lung: Secondary | ICD-10-CM

## 2010-07-11 ENCOUNTER — Encounter: Payer: Self-pay | Admitting: Thoracic Surgery

## 2010-07-11 ENCOUNTER — Encounter: Payer: Self-pay | Admitting: Urology

## 2010-07-19 IMAGING — CT CT CHEST W/ CM
2 of 3 series · 15 of 36 positions shown, 18 images · IV contrast (agent unspecified)
Comparison: 06/25/2008 and PET of 09/18/2007.

CLINICAL DATA: History of lung cancer.  Surgery only.  Right-sided
chest pain.

CT CHEST WITH CONTRAST
TECHNIQUE: Multidetector CT imaging of the chest was performed
following the standard protocol during bolus administration of
intravenous contrast.
Contrast: 80 ml Lmnipaque-7CC

[Series 2: chest with st · axial · 0.77mm/px · z∈[-307,-37]mm · 12 of 64 slices shown, 15 images]
[im 5/64  mediastinal]
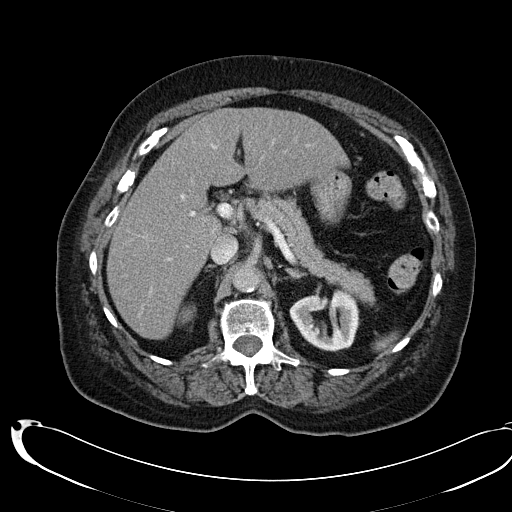
[im 5/64  lung]
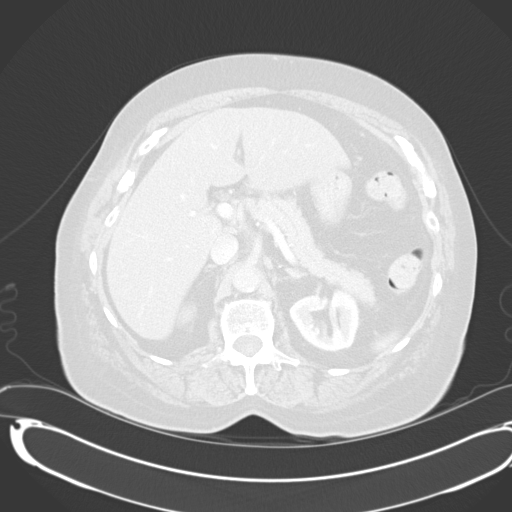
[im 10/64  lung]
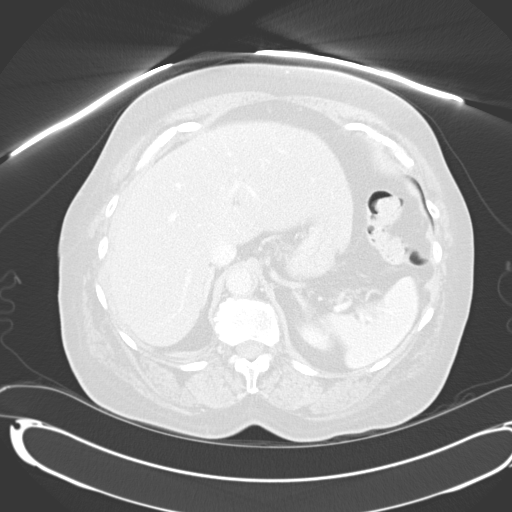
[im 15/64  lung]
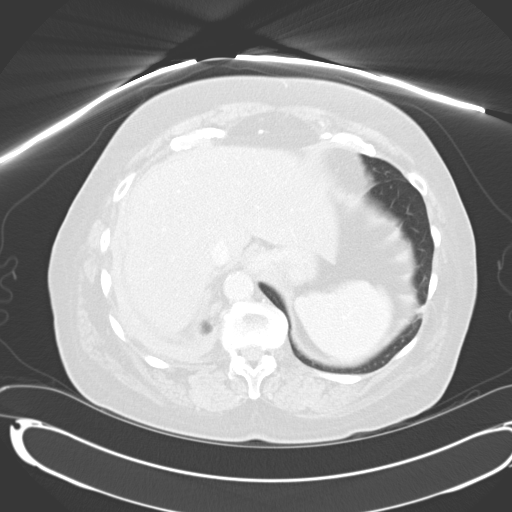
[im 19/64  lung]
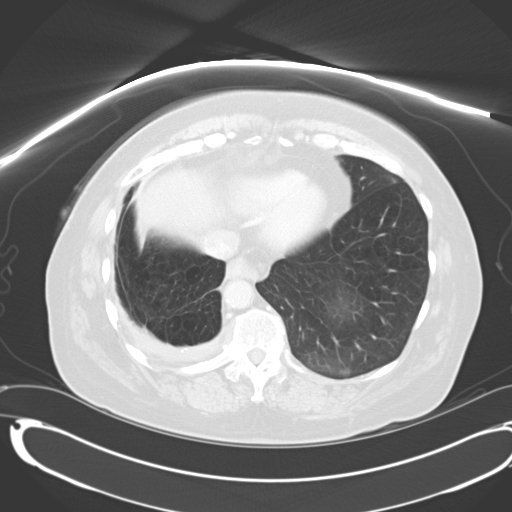
[im 24/64  mediastinal]
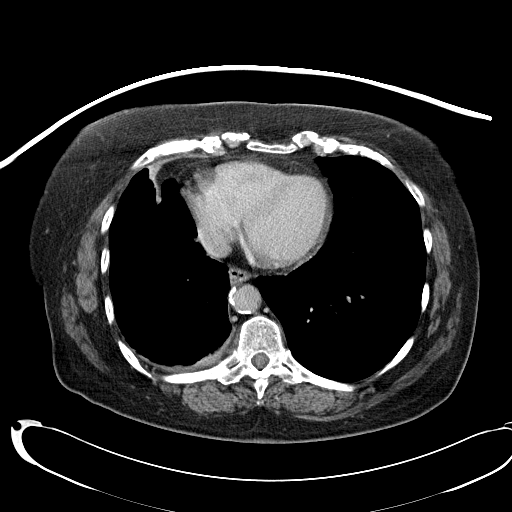
[im 24/64  lung]
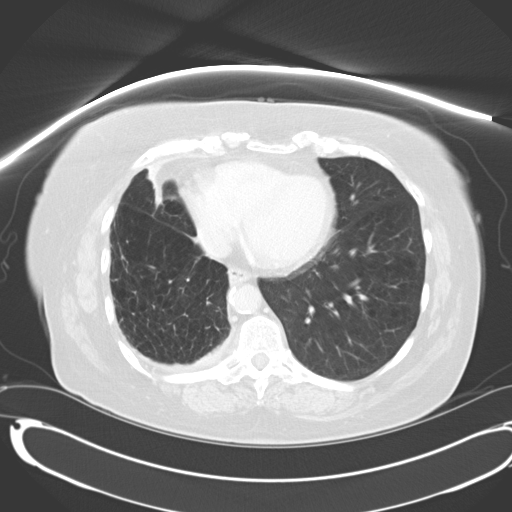
[im 29/64  lung]
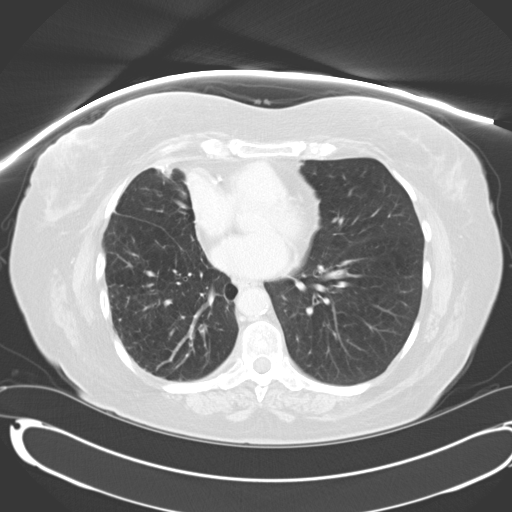
[im 36/64  lung]
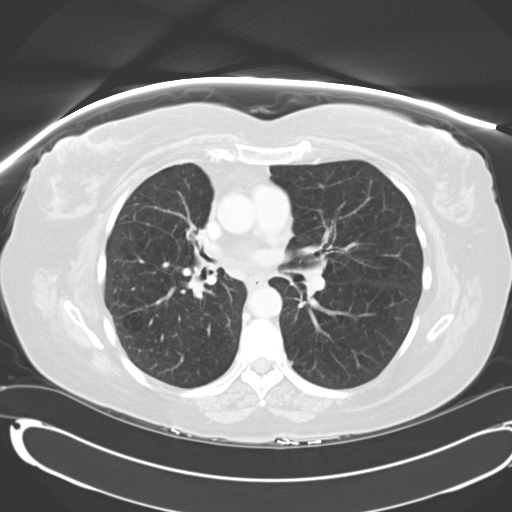
[im 40/64  lung]
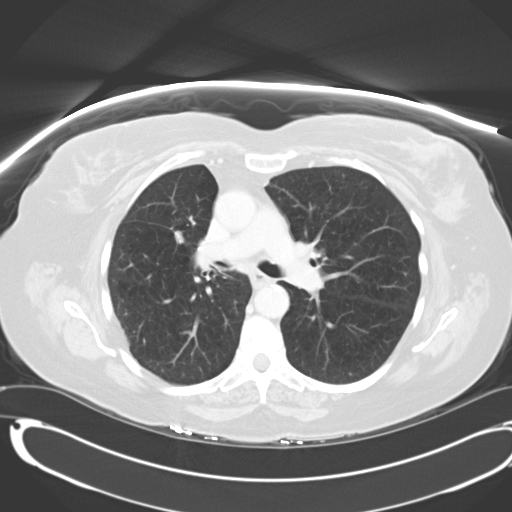
[im 45/64  mediastinal]
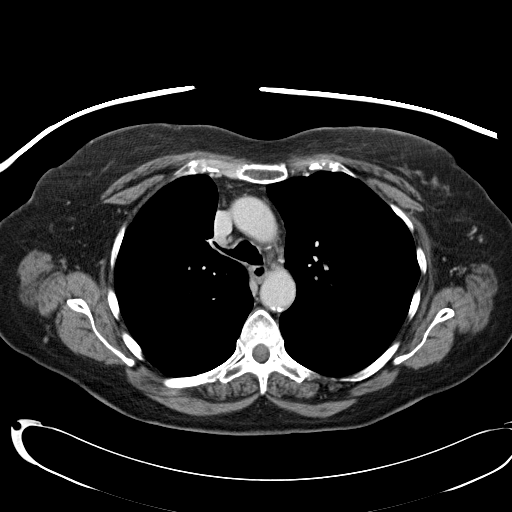
[im 45/64  lung]
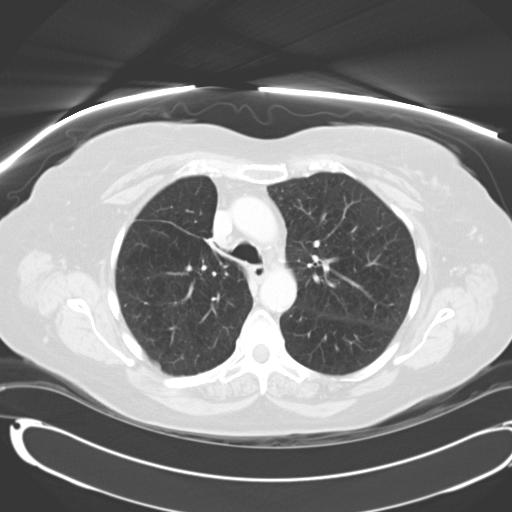
[im 50/64  lung]
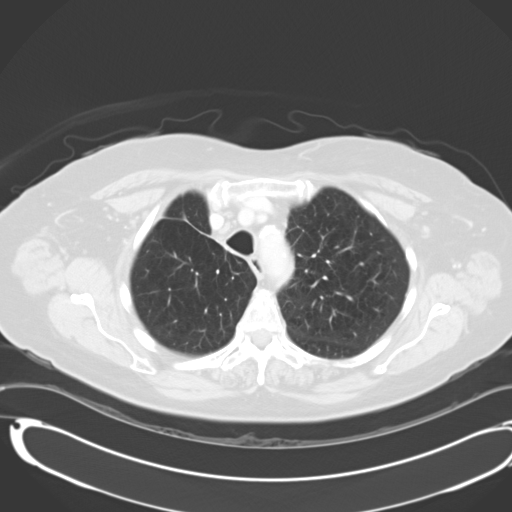
[im 54/64  lung]
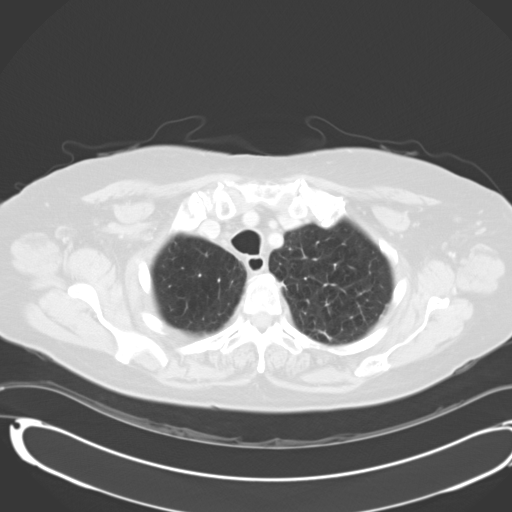
[im 59/64  lung]
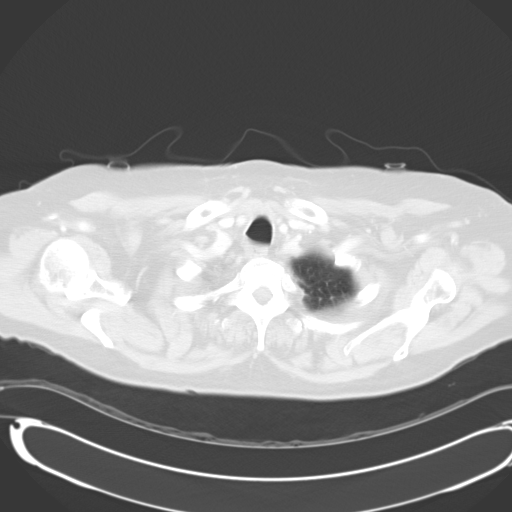

[Series 602: <mpr thick range> · coronal · 0.77mm/px · 3 of 78 slices shown]
[im 16/78  lung]
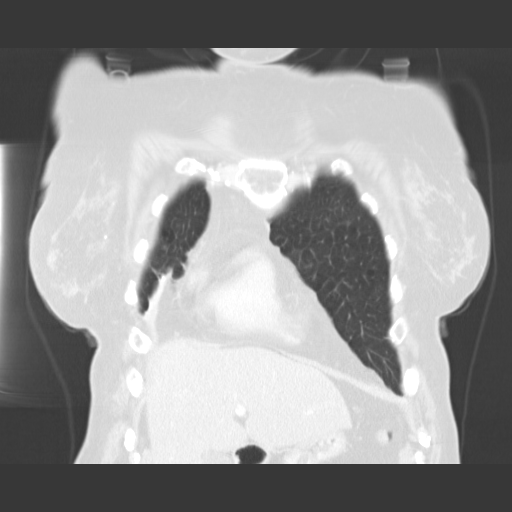
[im 31/78  lung]
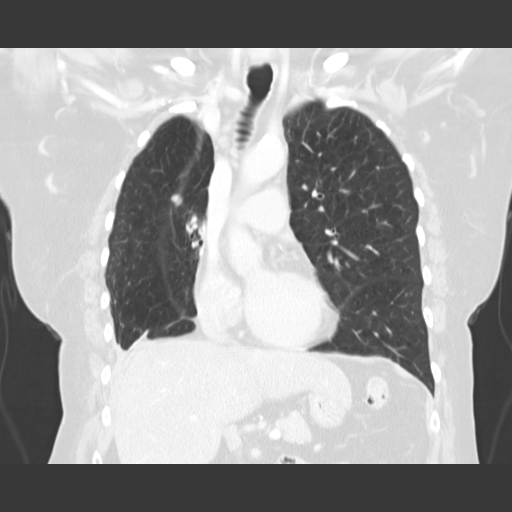
[im 47/78  lung]
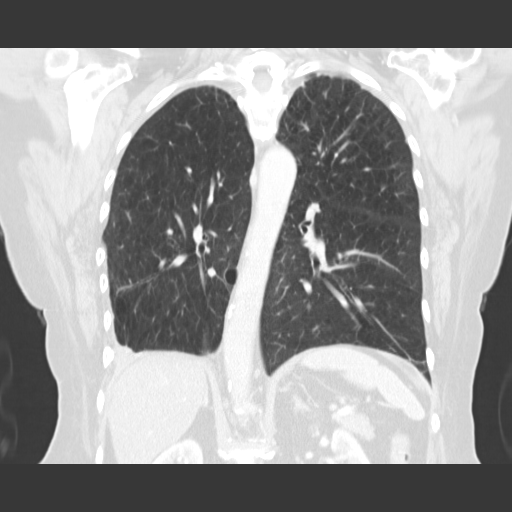

[15 of 36 positions shown; findings below may reference images not displayed]

FINDINGS: Lung windows demonstrate surgical changes of at least
partial right upper lobectomy.  Moderate centrilobular emphysema.
Similar configuration of the fissural based nodule anteriorly on
the right which measures 1.0 x 0.9 cm.  Felt to be similar on the
prior exam (when remeasured).

Soft tissue windows demonstrate normal heart size.  Right-sided
pleural fluid/thickening which is similar to on the prior exam.
Right and LAD coronary artery atherosclerosis again identified.

Small mediastinal lymph nodes which are similar. No mediastinal or
hilar adenopathy.

Limited abdominal imaging demonstrates mild fatty infiltration of
the liver.  A small right retrocrural lymph node is similar back to
12/20/2007 and likely reactive.  The previously described
hypoattenuation adjacent the falciform ligament is similar and
likely related to focal fat deposition.  Tiny hiatal hernia.
Normal adrenal glands. No acute osseous abnormality.
IMPRESSION: 1. No significant change since the prior exam.
2. No acute process or evidence of metastatic disease in the chest.
3.  Surgical changes on the right with a similar fissural based
nodule.
4.  Fatty infiltration of the liver with similar appearance of
probable more focal fat adjacent the falciform ligament.

## 2010-07-21 ENCOUNTER — Other Ambulatory Visit: Payer: Medicare Other

## 2010-07-21 ENCOUNTER — Other Ambulatory Visit: Payer: Self-pay | Admitting: Internal Medicine

## 2010-07-21 ENCOUNTER — Ambulatory Visit (INDEPENDENT_AMBULATORY_CARE_PROVIDER_SITE_OTHER): Payer: Medicare Other | Admitting: Internal Medicine

## 2010-07-21 ENCOUNTER — Encounter: Payer: Self-pay | Admitting: Internal Medicine

## 2010-07-21 ENCOUNTER — Telehealth: Payer: Self-pay | Admitting: Internal Medicine

## 2010-07-21 DIAGNOSIS — R110 Nausea: Secondary | ICD-10-CM

## 2010-07-21 DIAGNOSIS — R1084 Generalized abdominal pain: Secondary | ICD-10-CM

## 2010-07-21 LAB — CBC WITH DIFFERENTIAL/PLATELET
Basophils Relative: 0.6 % (ref 0.0–3.0)
Eosinophils Relative: 0.2 % (ref 0.0–5.0)
Lymphocytes Relative: 27.1 % (ref 12.0–46.0)
MCV: 100.7 fl — ABNORMAL HIGH (ref 78.0–100.0)
Monocytes Absolute: 0.7 10*3/uL (ref 0.1–1.0)
Neutrophils Relative %: 64 % (ref 43.0–77.0)
RBC: 4.12 Mil/uL (ref 3.87–5.11)
WBC: 8.9 10*3/uL (ref 4.5–10.5)

## 2010-07-21 LAB — BASIC METABOLIC PANEL
Chloride: 96 mEq/L (ref 96–112)
Creatinine, Ser: 1.1 mg/dL (ref 0.4–1.2)
Potassium: 4.8 mEq/L (ref 3.5–5.1)
Sodium: 137 mEq/L (ref 135–145)

## 2010-07-21 LAB — URINALYSIS, ROUTINE W REFLEX MICROSCOPIC
Ketones, ur: NEGATIVE
Total Protein, Urine: NEGATIVE
Urine Glucose: NEGATIVE
pH: 6.5 (ref 5.0–8.0)

## 2010-07-21 LAB — HEPATIC FUNCTION PANEL
ALT: 29 U/L (ref 0–35)
Alkaline Phosphatase: 86 U/L (ref 39–117)
Bilirubin, Direct: 0.3 mg/dL (ref 0.0–0.3)
Total Protein: 7.5 g/dL (ref 6.0–8.3)

## 2010-07-22 NOTE — Progress Notes (Signed)
Summary: xanax refill  Phone Note Refill Request Message from:  Fax from Pharmacy on July 03, 2009 3:18 PM  Refills Requested: Medication #1:  ALPRAZOLAM 0.5 MG  TABS one tablet as needed   Supply Requested: 1 month   Last Refilled: 05/25/2009  Is this ok to refill ?  [facility] [address]  Initial call taken by: Rock Nephew CMA,  July 03, 2009 3:19 PM  Follow-up for Phone Call        yes Follow-up by: [name] MD,  July 03, 2009 3:34 PM    Prescriptions: ALPRAZOLAM 0.5 MG  TABS (ALPRAZOLAM) one tablet as needed  #60 x 3   Entered by:   Rock Nephew CMA   Authorized by:   [name] MD   Signed by:   Rock Nephew CMA on 07/07/2009   Method used:   Telephoned to ...       [facility]  [address] [id number] ([facility]ail)       [address]       [place], [place]  [address]       Ph: [phone] or [phone]       Fax: [phone]   RxID:   [id number]

## 2010-07-22 NOTE — Medication Information (Signed)
Summary: [facility]   Imported By: Lennie Odor 06/29/2010 14:07:58  _____________________________________________________________________  External Attachment:    Type:   Image     Comment:   External Document

## 2010-07-22 NOTE — Letter (Signed)
Summary: [facility]  [facility]   Imported By: Sherian Rein 08/03/2009 10:28:34  _____________________________________________________________________  External Attachment:    Type:   Image     Comment:   External Document

## 2010-07-22 NOTE — Assessment & Plan Note (Signed)
Summary: PER PT CATARACT SURGERY CLEARANCE---STC   Vital Signs:  Patient profile:   77 year old female Height:      67 inches Weight:      178.50 pounds BMI:     28.06 O2 Sat:      92 % on Room air Temp:     97.7 degrees F oral Pulse rate:   99 / minute Pulse rhythm:   regular Resp:     16 per minute BP sitting:   110 / 68  (left arm) Cuff size:   large  Vitals Entered By: Rock Nephew CMA (February 10, 2010 2:12 PM)  Nutrition Counseling: Patient's BMI is greater than 25 and therefore counseled on weight management options.  O2 Flow:  Room air  Primary Care Provider:  [name] MD   History of Present Illness:  Follow-Up Visit      This is a 77 year old woman who presents for Follow-up visit.  The patient complains of SOB and DOE, but denies chest pain, palpitations, dizziness, syncope, low blood sugar symptoms, high blood sugar symptoms, edema, PND, and orthopnea.  Since the last visit the patient notes no new problems or concerns.  The patient reports taking meds as prescribed and dietary compliance.  When questioned about possible medication side effects, the patient notes none.  She needs preop clearance to undergo cataract surgery.  Preventive Screening-Counseling & Management  Alcohol-Tobacco     Alcohol drinks/day: 0     Smoking Status: quit     Year Quit: 2003     Passive Smoke Exposure: no     Tobacco Counseling: to remain off tobacco products  Hep-HIV-STD-Contraception     Hepatitis Risk: no risk noted     HIV Risk: no risk noted     STD Risk: no risk noted      Drug Use:  no.    Medications Prior to Update: 1)  Alprazolam 0.5 Mg  Tabs (Alprazolam) .... Two Tablet As Needed 2)  Calcium 600/vitamin D 600-400 Mg-Unit  Tabs (Calcium Carbonate-Vitamin D) .... Take 1 Tablet By Mouth Once A Day 3)  Centrum Silver   Tabs (Multiple Vitamins-Minerals) .... Take 1 Tablet By Mouth Once A Day 4)  Levoxyl 100 Mcg  Tabs (Levothyroxine Sodium) .... Take 1 Tablet  By Mouth Once A Day 5)  Oxygen .... 2l At Bedtime and As Needed 6)  Xopenex 1.25 Mg/61ml  Nebu (Levalbuterol Hcl) .... Inhale 1 Vial Via Hhn Three Times A Day and As Needed 7)  Aspirin Adult Low Strength 81 Mg Tbec (Aspirin) .... Take 1 By Mouth Once Daily 8)  Lovastatin 40 Mg Tabs (Lovastatin) .... Once Daily 9)  Advair Diskus 250-50 Mcg/dose Aepb (Fluticasone-Salmeterol) .... One Puff Two Times A Day For Asthma 10)  Doxycycline Hyclate 100 Mg Tabs (Doxycycline Hyclate) .... One By Mouth Two Times A Day For 10 Days 11)  Qvar 80 Mcg/act Aers (Beclomethasone Dipropionate) .... Inhale 2 Puffs Two Times A Day and Rinse Mouth Well After Use  Current Medications (verified): 1)  Alprazolam 0.5 Mg  Tabs (Alprazolam) .... Two Tablet As Needed 2)  Calcium 600/vitamin D 600-400 Mg-Unit  Tabs (Calcium Carbonate-Vitamin D) .... Take 1 Tablet By Mouth Once A Day 3)  Centrum Silver   Tabs (Multiple Vitamins-Minerals) .... Take 1 Tablet By Mouth Once A Day 4)  Levoxyl 100 Mcg  Tabs (Levothyroxine Sodium) .... Take 1 Tablet By Mouth Once A Day 5)  Oxygen .... 2l At Bedtime  and As Needed 6)  Xopenex 1.25 Mg/47ml  Nebu (Levalbuterol Hcl) .... Inhale 1 Vial Via Hhn Three Times A Day and As Needed 7)  Aspirin Adult Low Strength 81 Mg Tbec (Aspirin) .... Take 1 By Mouth Once Daily 8)  Lovastatin 40 Mg Tabs (Lovastatin) .... Once Daily 9)  Qvar 80 Mcg/act Aers (Beclomethasone Dipropionate) .... Inhale 2 Puffs Two Times A Day and Rinse Mouth Well After Use  Allergies (verified): 1)  ! Penicillin 2)  ! Sulfa 3)  ! Codeine 4)  ! Nitrofurantoin Monohyd Macro (Nitrofurantoin Monohyd Macro)  Past History:  Past Medical History: Last updated: 10/01/2009 Allergic Rhinitis COPD Hx bronchogenic carcinoma  resected RUL RUL nodule Pelvic cyst Bladder atony  Past Surgical History: Last updated: 10/01/2009 Right upper lobectomy 2005- Dr. Edwyna Shell hysterectomy spine surgery Needle aspiration pelvic cyst- Dr  Retta Diones 2011  Family History: Last updated: 10/10/2007 father-heart disease brother and sister-rheumatism brother died- leukemiaa written for her at a [employer] [facility] is a  Social History: Last updated: 10/29/2008 Pt is married with children. Pt is retired from school system, [employer]. Quit smoking 2001.  Smoked for 42 yrs x 1ppd. Alcohol use-no Drug use-no Regular exercise-no  Risk Factors: Alcohol Use: 0 (02/10/2010) Exercise: no (10/29/2008)  Risk Factors: Smoking Status: quit (02/10/2010) Passive Smoke Exposure: no (02/10/2010)  Family History: Reviewed history from 10/10/2007 and no changes required. father-heart disease brother and sister-rheumatism brother died- leukemiaa written for her at a [employer] is a  Social History: Reviewed history from 10/29/2008 and no changes required. Pt is married with children. Pt is retired from school system, [employer]. Quit smoking 2001.  Smoked for 42 yrs x 1ppd. Alcohol use-no Drug use-no Regular exercise-no HIV Risk:  no risk noted STD Risk:  no risk noted  Review of Systems       The patient complains of weight gain.  The patient denies anorexia, fever, weight loss, chest pain, syncope, peripheral edema, prolonged cough, headaches, hemoptysis, abdominal pain, hematuria, suspicious skin lesions, transient blindness, and enlarged lymph nodes.   GU:  Complains of nocturia, urinary frequency, and urinary hesitancy; denies abnormal vaginal bleeding, discharge, dysuria, and hematuria. Endo:  Denies cold intolerance, excessive hunger, excessive thirst, excessive urination, heat intolerance, polyuria, and weight change.  Physical Exam  General:  alert, well-developed, well-nourished, well-hydrated, appropriate dress, healthy-appearing, cooperative to examination, and good hygiene.   Head:  normocephalic and atraumatic.   Mouth:  Oral mucosa and oropharynx without lesions or exudates.  Teeth in good repair. Neck:  No deformities,  masses, or tenderness noted. Lungs:  She has late, bilateral end-expiratory wheezes but good air movement and no decreased breath sounds. There are no rales. Heart:  normal rate, regular rhythm, no murmur, no gallop, and no rub.   Abdomen:  soft, non-tender, normal bowel sounds, no distention, no masses, no guarding, no rigidity, no rebound tenderness, no hepatomegaly, and no splenomegaly.   Msk:  normal ROM, no joint tenderness, no joint swelling, no joint warmth, no redness over joints, no joint deformities, no joint instability, and no crepitation.   Pulses:  R and L carotid,radial,femoral,dorsalis pedis and posterior tibial pulses are full and equal bilaterally Extremities:  No clubbing, cyanosis, edema, or deformity noted with normal full range of motion of all joints.   Neurologic:  No cranial nerve deficits noted. Station and gait are normal. Plantar reflexes are down-going bilaterally. DTRs are symmetrical throughout. Sensory, motor and coordinative functions appear intact. Skin:  turgor normal, color normal, no rashes,  no suspicious lesions, no ecchymoses, no petechiae, no purpura, no ulcerations, and no edema.   Cervical Nodes:  no anterior cervical adenopathy and no posterior cervical adenopathy.   Axillary Nodes:  no R axillary adenopathy and no L axillary adenopathy.   Inguinal Nodes:  no R inguinal adenopathy and no L inguinal adenopathy.   Psych:  Cognition and judgment appear intact. Alert and cooperative with normal attention span and concentration. No apparent delusions, illusions, hallucinations   Impression & Recommendations:  Problem # 1:  CATARACT ASSOCIATED WITH OTHER SYNDROMES (ICD-366.44) she is low risk for cataract surgery with local anesthesia and IV sedation, see note that I sent to [facility]  Problem # 2:  ATONY OF BLADDER (ICD-596.4) Assessment: Unchanged she has a little more than 400cc of urine in her bladder after the she tried to empty it after her  recent CT scan, this has been managed by Urology Orders: Venipuncture (04540) TLB-Lipid Panel (80061-LIPID) TLB-BMP (Basic Metabolic Panel-BMET) (80048-METABOL) TLB-CBC Platelet - w/Differential (85025-CBCD) TLB-Hepatic/Liver Function Pnl (80076-HEPATIC) TLB-TSH (Thyroid Stimulating Hormone) (84443-TSH) TLB-A1C / Hgb A1C (Glycohemoglobin) (83036-A1C) TLB-Udip w/ Micro (81001-URINE)  Problem # 3:  HYPERGLYCEMIA, BORDERLINE (ICD-790.29) Assessment: Unchanged  Orders: Venipuncture ([id number]) TLB-Lipid Panel (80061-LIPID) TLB-BMP (Basic Metabolic Panel-BMET) (80048-METABOL) TLB-CBC Platelet - w/Differential (85025-CBCD) TLB-Hepatic/Liver Function Pnl (80076-HEPATIC) TLB-TSH (Thyroid Stimulating Hormone) (84443-TSH) TLB-A1C / Hgb A1C (Glycohemoglobin) (83036-A1C) TLB-Udip w/ Micro (81001-URINE)  Labs Reviewed: Creat: 0.8 (10/30/2009)     Problem # 4:  EMPHYSEMA (ICD-492.8) Assessment: Unchanged  Problem # 5:  PREOPERATIVE EXAMINATION (ICD-V72.84) Assessment: New  Complete Medication List: 1)  Alprazolam 0.5 Mg Tabs (Alprazolam) .... Two tablet as needed 2)  Calcium 600/vitamin D 600-400 Mg-unit Tabs (Calcium carbonate-vitamin d) .... Take 1 tablet by mouth once a day 3)  Centrum Silver Tabs (Multiple vitamins-minerals) .... Take 1 tablet by mouth once a day 4)  Levoxyl 100 Mcg Tabs (Levothyroxine sodium) .... Take 1 tablet by mouth once a day 5)  Oxygen  .... 2l at bedtime and as needed 6)  Xopenex 1.25 Mg/8ml Nebu (Levalbuterol hcl) .... Inhale 1 vial via hhn three times a day and as needed 7)  Aspirin Adult Low Strength 81 Mg Tbec (Aspirin) .... Take 1 by mouth once daily 8)  Lovastatin 40 Mg Tabs (Lovastatin) .... Once daily 9)  Qvar 80 Mcg/act Aers (Beclomethasone dipropionate) .... Inhale 2 puffs two times a day and rinse mouth well after use  Patient Instructions: 1)  Please schedule a follow-up appointment in 3 months. 2)  It is important that you exercise regularly at  least 20 minutes 5 times a week. If you develop chest pain, have severe difficulty breathing, or feel very tired , stop exercising immediately and seek medical attention. 3)  You need to lose weight. Consider a lower calorie diet and regular exercise.  4)  Check your blood sugars regularly. If your readings are usually above 200 or below 70 you should contact our office. 5)  It is important that your Diabetic A1c level is checked every 3 months. 6)  See your eye doctor yearly to check for diabetic eye damage. 7)  Check your feet each night for sore areas, calluses or signs of infection. 8)  Check your Blood Pressure regularly. If it is above 130/80: you should make an appointment.

## 2010-07-22 NOTE — Progress Notes (Signed)
Summary: xopenex refill  Phone Note Call from Patient Call back at Home Phone [phone]   Caller: Patient Call For: young Summary of Call: pt wants 4 boxes (the "full rx") for xopenex since her co-pay is 50.00 each time they bring this out to her. [facility] [place] Initial call taken by: Tivis Ringer, CNA,  March 29, 2010 2:27 PM  Follow-up for Phone Call        New RX called to [facility] for full 1 month supply on Xopenex 1.25. The patient is aware. Follow-up by: Michel Bickers CMA,  March 29, 2010 5:02 PM    Prescriptions: XOPENEX 1.25 MG/3ML  NEBU (LEVALBUTEROL HCL) Inhale 1 vial via [facility] three times a day and as needed  #100 x 11   Entered by:   Michel Bickers CMA   Authorized by:   Waymon Budge MD   Signed by:   Michel Bickers CMA on 03/29/2010   Method used:   Telephoned to ...       [facility]* (retail)       [address]       [place], [place]  [address]       Ph: [phone]       Fax: [phone]   RxID:   [id number]

## 2010-07-22 NOTE — Letter (Signed)
Summary: Lipid Letter  [facility]  [address] [place], [place] [address]   Phone: [phone]  Fax: [phone]    11/01/2009  [name] [address] [place], [place]  [address]  Dear [name]:  We have carefully reviewed your last lipid profile from 10/30/2009 and the results are noted below with a summary of recommendations for lipid management.    Cholesterol:       163     Goal: <200   HDL "good" Cholesterol:   01.60     Goal: >40   LDL "bad" Cholesterol:   71     Goal: <100   Triglycerides:       86.0     Goal: <150        TLC Diet (Therapeutic Lifestyle Change): Saturated Fats & Transfatty acids should be kept < 7% of total calories ***Reduce Saturated Fats Polyunstaurated Fat can be up to 10% of total calories Monounsaturated Fat Fat can be up to 20% of total calories Total Fat should be no greater than 25-35% of total calories Carbohydrates should be 50-60% of total calories Protein should be approximately 15% of total calories Fiber should be at least 20-30 grams a day ***Increased fiber may help lower LDL Total Cholesterol should be < 200mg /day Consider adding plant stanol/sterols to diet (example: Benacol spread) ***A higher intake of unsaturated fat may reduce Triglycerides and Increase HDL    Adjunctive Measures (may lower LIPIDS and reduce risk of Heart Attack) include: Aerobic Exercise (20-30 minutes 3-4 times a week) Limit Alcohol Consumption Weight Reduction Aspirin 75-81 mg a day by mouth (if not allergic or contraindicated) Dietary Fiber 20-30 grams a day by mouth     Current Medications: 1)    Alprazolam 0.5 Mg  Tabs (Alprazolam) .... One tablet as needed 2)    Calcium 600/vitamin D 600-400 Mg-unit  Tabs (Calcium carbonate-vitamin d) .... Take 1 tablet by mouth once a day 3)    Centrum Silver   Tabs (Multiple vitamins-minerals) .... Take 1 tablet by mouth once a day 4)    Levoxyl 100 Mcg  Tabs (Levothyroxine sodium) ....  Take 1 tablet by mouth once a day 5)    Oxygen  .... 2l at bedtime and as needed 6)    Xopenex 1.25 Mg/63ml  Nebu (Levalbuterol hcl) .... Inhale 1 vial via hhn three times a day and as needed 7)    Aspirin Adult Low Strength 81 Mg Tbec (Aspirin) .... Take 1 by mouth once daily 8)    Lovastatin 40 Mg Tabs (Lovastatin) .... Once daily 9)    Prednisone 10 Mg Tabs (Prednisone) .[name].. 1 daily 10)    Advair Diskus 250-50 Mcg/dose Aepb (Fluticasone-salmeterol) .... One puff two times a day for asthma 11)    Avelox 400 Mg Tabs (Moxifloxacin hcl) .... One by mouth once daily for 7 days  If you have any questions, please call. We appreciate being able to work with you.   Sincerely,    [facility] [name] MD

## 2010-07-22 NOTE — Assessment & Plan Note (Signed)
Summary: 3 MTH FU---STC   Vital Signs:  Patient profile:   77 year old female Menstrual status:  hysterectomy Height:      67 inches Weight:      180 pounds BMI:     28.29 O2 Sat:      94 % on Room air Temp:     97.6 degrees F oral Pulse rate:   64 / minute Pulse rhythm:   regular Resp:     16 per minute BP sitting:   124 / 70  (left arm) Cuff size:   large  Vitals Entered By: Rock Nephew CMA (May 06, 2010 2:49 PM)  Nutrition Counseling: Patient's BMI is greater than 25 and therefore counseled on weight management options.  O2 Flow:  Room air CC: follow-up visit, Lipid Management Is Patient Diabetic? No Pain Assessment Patient in pain? no          Menstrual Status hysterectomy   Primary Care Provider:  [name] MD  CC:  follow-up visit and Lipid Management.  History of Present Illness:  Follow-Up Visit      This is a 77 year old woman who presents for Follow-up visit.  The patient denies chest pain, palpitations, dizziness, syncope, low blood sugar symptoms, high blood sugar symptoms, edema, SOB, DOE, PND, and orthopnea.  Since the last visit the patient notes no new problems or concerns.  The patient reports taking meds as prescribed, monitoring BP, monitoring blood sugars, and dietary compliance.  When questioned about possible medication side effects, the patient notes none.    Lipid Management History:      Positive NCEP/ATP III risk factors include female age 75 years old or older, diabetes, and hypertension.  Negative NCEP/ATP III risk factors include no history of early menopause without estrogen hormone replacement, HDL cholesterol greater than 60, no family history for ischemic heart disease, non-tobacco-user status, no ASHD (atherosclerotic heart disease), no prior stroke/TIA, no peripheral vascular disease, and no history of aortic aneurysm.        The patient states that she knows about the "Therapeutic Lifestyle Change" diet.  Her compliance  with the TLC diet is good.  The patient expresses understanding of adjunctive measures for cholesterol lowering.  Adjunctive measures started by the patient include aerobic exercise, fiber, limit alcohol consumpton, and weight reduction.  She expresses no side effects from her lipid-lowering medication.  The patient denies any symptoms to suggest myopathy or liver disease.     Preventive Screening-Counseling & Management  Alcohol-Tobacco     Alcohol drinks/day: 0     Alcohol Counseling: not indicated; patient does not drink     Smoking Status: quit     Year Quit: 2003     Passive Smoke Exposure: no     Tobacco Counseling: to remain off tobacco products  Hep-HIV-STD-Contraception     Hepatitis Risk: no risk noted     HIV Risk: no risk noted     STD Risk: no risk noted      Drug Use:  no.    Clinical Review Panels:  Prevention   Last Mammogram:  BI-RADS CATEGORY 3:  Probably benign finding(s) - short interval^MM DIGITAL DIAGNOSTIC BILAT (03/31/2010)  Immunizations   Last Tetanus Booster:  Td (11/17/2009)   Last Flu Vaccine:  Fluvax 3+ (03/16/2010)   Last H1N1 Vaccine 1:  Given (05/28/2008)   Last Pneumovax:  Pneumovax (02/21/2006)  Lipid Management   Cholesterol:  206 (02/10/2010)   LDL (bad choesterol):  71 (10/30/2009)  HDL (good cholesterol):  80.10 (02/10/2010)  Diabetes Management   HgBA1C:  5.3 (02/10/2010)   Creatinine:  0.7 (02/10/2010)   Last Flu Vaccine:  Fluvax 3+ (03/16/2010)   Last Pneumovax:  Pneumovax (02/21/2006)  CBC   WBC:  8.5 (02/10/2010)   RBC:  3.68 (02/10/2010)   Hgb:  12.6 (02/10/2010)   Hct:  36.9 (02/10/2010)   Platelets:  261.0 (02/10/2010)   MCV  100.2 (02/10/2010)   MCHC  34.3 (02/10/2010)   RDW  14.6 (02/10/2010)   PMN:  69.1 (02/10/2010)   Lymphs:  23.0 (02/10/2010)   Monos:  7.2 (02/10/2010)   Eosinophils:  0.3 (02/10/2010)   Basophil:  0.4 (02/10/2010)  Complete Metabolic Panel   Glucose:  92 (02/10/2010)   Sodium:  135  (02/10/2010)   Potassium:  4.2 (02/10/2010)   Chloride:  94 (02/10/2010)   CO2:  32 (02/10/2010)   BUN:  6 (02/10/2010)   Creatinine:  0.7 (02/10/2010)   Albumin:  3.6 (02/10/2010)   Total Protein:  7.0 (02/10/2010)   Calcium:  9.7 (02/10/2010)   Total Bili:  0.6 (02/10/2010)   Alk Phos:  89 (02/10/2010)   SGPT (ALT):  30 (02/10/2010)   SGOT (AST):  44 (02/10/2010)   Medications Prior to Update: 1)  Alprazolam 0.5 Mg  Tabs (Alprazolam) .... Two Tablet As Needed 2)  Calcium 600/vitamin D 600-400 Mg-Unit  Tabs (Calcium Carbonate-Vitamin D) .... Take 1 Tablet By Mouth Once A Day 3)  Centrum Silver   Tabs (Multiple Vitamins-Minerals) .... Take 1 Tablet By Mouth Once A Day 4)  Levoxyl 100 Mcg  Tabs (Levothyroxine Sodium) .... Take 1 Tablet By Mouth Once A Day 5)  Oxygen .... 2l At Bedtime and As Needed 6)  Xopenex 1.25 Mg/46ml  Nebu (Levalbuterol Hcl) .... Inhale 1 Vial Via Hhn Three Times A Day and As Needed 7)  Aspirin Adult Low Strength 81 Mg Tbec (Aspirin) .... Take 1 By Mouth Once Daily 8)  Lovastatin 40 Mg Tabs (Lovastatin) .... Once Daily 9)  Qvar 80 Mcg/act Aers (Beclomethasone Dipropionate) .... Inhale 2 Puffs Two Times A Day and Rinse Mouth Well After Use 10)  Tessalon Perles 100 Mg Caps (Benzonatate) .[name].. 1-2 Four Times A Day As Needed Cough 11)  Prednisone 10 Mg Tabs (Prednisone) .[name].. 1 Daily X 7 Days 12)  Zithromax Z-Pak 250 Mg Tabs (Azithromycin) .... 2 Today Then One Daily  Current Medications (verified): 1)  Alprazolam 0.5 Mg  Tabs (Alprazolam) .... Two Tablet As Needed 2)  Calcium 600/vitamin D 600-400 Mg-Unit  Tabs (Calcium Carbonate-Vitamin D) .... Take 1 Tablet By Mouth Once A Day 3)  Centrum Silver   Tabs (Multiple Vitamins-Minerals) .... Take 1 Tablet By Mouth Once A Day 4)  Levoxyl 100 Mcg  Tabs (Levothyroxine Sodium) .... Take 1 Tablet By Mouth Once A Day 5)  Oxygen .... 2l At Bedtime and As Needed 6)  Xopenex 1.25 Mg/1ml  Nebu (Levalbuterol Hcl) .... Inhale 1  Vial Via Hhn Three Times A Day and As Needed 7)  Aspirin Adult Low Strength 81 Mg Tbec (Aspirin) .... Take 1 By Mouth Once Daily 8)  Lovastatin 40 Mg Tabs (Lovastatin) .... Once Daily 9)  Qvar 80 Mcg/act Aers (Beclomethasone Dipropionate) .... Inhale 2 Puffs Two Times A Day and Rinse Mouth Well After Use 10)  Tessalon Perles 100 Mg Caps (Benzonatate) .[name].. 1-2 Four Times A Day As Needed Cough 11)  Prednisone 10 Mg Tabs (Prednisone) .[name].. 1 Daily X 7 Days 12)  Zithromax Z-Pak 250 Mg Tabs (Azithromycin) .... 2 Today Then One Daily  Allergies (verified): 1)  ! Penicillin 2)  ! Sulfa 3)  ! Codeine 4)  ! Nitrofurantoin Monohyd Macro (Nitrofurantoin Monohyd Macro) 5)  ! Ampicillin  Past History:  Past Medical History: Last updated: 10/01/2009 Allergic Rhinitis COPD Hx bronchogenic carcinoma  resected RUL RUL nodule Pelvic cyst Bladder atony  Past Surgical History: Last updated: 10/01/2009 Right upper lobectomy 2005- Dr. Edwyna Shell hysterectomy spine surgery Needle aspiration pelvic cyst- Dr Retta Diones 2011  Family History: Last updated: 10/10/2007 father-heart disease brother and sister-rheumatism brother died- leukemiaa written for her at a [employer] is a  Social History: Last updated: 10/29/2008 Pt is married with children. Pt is retired from school system, [employer]. Quit smoking 2001.  Smoked for 42 yrs x 1ppd. Alcohol use-no Drug use-no Regular exercise-no  Risk Factors: Alcohol Use: 0 (05/06/2010) Exercise: no (10/29/2008)  Risk Factors: Smoking Status: quit (05/06/2010) Passive Smoke Exposure: no (05/06/2010)  Family History: Reviewed history from 10/10/2007 and no changes required. father-heart disease brother and sister-rheumatism brother died- leukemiaa written for her at a [employer] is a  Social History: Reviewed history from 10/29/2008 and no changes required. Pt is married with children. Pt is retired from school system, [employer]. Quit smoking 2001.   Smoked for 42 yrs x 1ppd. Alcohol use-no Drug use-no Regular exercise-no  Review of Systems  The patient denies anorexia, fever, weight loss, chest pain, dyspnea on exertion, peripheral edema, prolonged cough, headaches, hemoptysis, abdominal pain, hematuria, difficulty walking, and depression.   GU:  Complains of urinary frequency; denies discharge, dysuria, hematuria, nocturia, and urinary hesitancy. Endo:  Complains of polyuria; denies cold intolerance, excessive hunger, excessive thirst, excessive urination, heat intolerance, and weight change.  Physical Exam  General:  alert, well-developed, well-nourished, well-hydrated, appropriate dress, healthy-appearing, cooperative to examination, and good hygiene.   Head:  normocephalic, atraumatic, no abnormalities observed, and no abnormalities palpated.   Mouth:  good dentition, no gingival abnormalities, no dental plaque, and pharynx pink and moist.   Neck:  supple, full ROM, no masses, no thyromegaly, no JVD, normal carotid upstroke, no carotid bruits, and no cervical lymphadenopathy.   Lungs:  normal respiratory effort, no intercostal retractions, no accessory muscle use, and normal breath sounds.   Heart:  normal rate, regular rhythm, no murmur, no gallop, and no rub.   Abdomen:  soft, non-tender, normal bowel sounds, no distention, no masses, no guarding, no rigidity, no rebound tenderness, no hepatomegaly, and no splenomegaly.   Msk:  normal ROM, no joint tenderness, no joint swelling, no joint warmth, no redness over joints, no joint deformities, no joint instability, and no crepitation.   Pulses:  R and L carotid,radial,femoral,dorsalis pedis and posterior tibial pulses are full and equal bilaterally Extremities:  No clubbing, cyanosis, edema, or deformity noted with normal full range of motion of all joints.   Neurologic:  No cranial nerve deficits noted. Station and gait are normal. Plantar reflexes are down-going bilaterally. DTRs are  symmetrical throughout. Sensory, motor and coordinative functions appear intact. Skin:  turgor normal, color normal, no rashes, no suspicious lesions, no ecchymoses, no petechiae, no purpura, no ulcerations, and no edema.   Cervical Nodes:  no anterior cervical adenopathy and no posterior cervical adenopathy.   Psych:  Cognition and judgment appear intact. Alert and cooperative with normal attention span and concentration. No apparent delusions, illusions, hallucinations   Impression & Recommendations:  Problem # 1:  HYPERGLYCEMIA, BORDERLINE (ICD-790.29) Assessment Unchanged  Orders: Venipuncture (14782) TLB-BMP (Basic Metabolic Panel-BMET) (80048-METABOL) TLB-Hepatic/Liver Function Pnl (80076-HEPATIC) TLB-TSH (Thyroid Stimulating Hormone) (84443-TSH) TLB-A1C / Hgb A1C (Glycohemoglobin) (83036-A1C) TLB-Udip w/ Micro (81001-URINE) T-Urine Culture (Spectrum Order) [id number])  Labs Reviewed: Creat: 0.7 (02/10/2010)     Problem # 2:  HYPOTHYROIDISM (ICD-244.9) Assessment: Unchanged  Her updated medication list for this problem includes:    Levoxyl 100 Mcg Tabs (Levothyroxine sodium) .[name]... Take 1 tablet by mouth once a day  Orders: Venipuncture (78469) TLB-BMP (Basic Metabolic Panel-BMET) (80048-METABOL) TLB-Hepatic/Liver Function Pnl (80076-HEPATIC) TLB-TSH (Thyroid Stimulating Hormone) (84443-TSH) TLB-A1C / Hgb A1C (Glycohemoglobin) (83036-A1C) TLB-Udip w/ Micro (81001-URINE) T-Urine Culture (Spectrum Order) [id number])  Labs Reviewed: TSH: 0.98 (02/10/2010)    HgBA1c: 5.3 (02/10/2010) Chol: 206 (02/10/2010)   HDL: 80.10 (02/10/2010)   LDL: 71 (10/30/2009)   TG: 114.0 (02/10/2010)  Problem # 3:  PURE HYPERCHOLESTEROLEMIA (ICD-272.0) Assessment: Unchanged  Her updated medication list for this problem includes:    Lovastatin 40 Mg Tabs (Lovastatin) ..... Once daily  Orders: Venipuncture (44010) TLB-BMP (Basic Metabolic Panel-BMET) (80048-METABOL) TLB-Hepatic/Liver  Function Pnl (80076-HEPATIC) TLB-TSH (Thyroid Stimulating Hormone) (84443-TSH) TLB-A1C / Hgb A1C (Glycohemoglobin) (83036-A1C) TLB-Udip w/ Micro (81001-URINE) T-Urine Culture (Spectrum Order) [id number])  Labs Reviewed: SGOT: 44 (02/10/2010)   SGPT: 30 (02/10/2010)  Lipid Goals: Chol Goal: 200 (11/27/2008)   HDL Goal: 40 (11/27/2008)   LDL Goal: 100 (11/27/2008)   TG Goal: 150 (11/27/2008)  10 Yr Risk Heart Disease: 9 % Prior 10 Yr Risk Heart Disease: Not enough information (11/27/2008)   HDL:80.10 (02/10/2010), 74.60 (10/30/2009)  LDL:71 (10/30/2009), DEL (34/74/2595)  Chol:206 (02/10/2010), 163 (10/30/2009)  Trig:114.0 (02/10/2010), 86.0 (10/30/2009)  Complete Medication List: 1)  Alprazolam 0.5 Mg Tabs (Alprazolam) .... Two tablet as needed 2)  Calcium 600/vitamin D 600-400 Mg-unit Tabs (Calcium carbonate-vitamin d) .... Take 1 tablet by mouth once a day 3)  Centrum Silver Tabs (Multiple vitamins-minerals) .... Take 1 tablet by mouth once a day 4)  Levoxyl 100 Mcg Tabs (Levothyroxine sodium) .... Take 1 tablet by mouth once a day 5)  Oxygen  .... 2l at bedtime and as needed 6)  Xopenex 1.25 Mg/23ml Nebu (Levalbuterol hcl) .... Inhale 1 vial via hhn three times a day and as needed 7)  Aspirin Adult Low Strength 81 Mg Tbec (Aspirin) .... Take 1 by mouth once daily 8)  Lovastatin 40 Mg Tabs (Lovastatin) .... Once daily 9)  Qvar 80 Mcg/act Aers (Beclomethasone dipropionate) .... Inhale 2 puffs two times a day and rinse mouth well after use 10)  Tessalon Perles 100 Mg Caps (Benzonatate) .[name].. 1-2 four times a day as needed cough 11)  Prednisone 10 Mg Tabs (Prednisone) .[name].. 1 daily x 7 days 12)  Zithromax Z-pak 250 Mg Tabs (Azithromycin) .... 2 today then one daily  Lipid Assessment/Plan:      Based on NCEP/ATP III, the patient's risk factor category is "history of diabetes".  The patient's lipid goals are as follows: Total cholesterol goal is 200; LDL cholesterol goal is 100; HDL  cholesterol goal is 40; Triglyceride goal is 150.    Patient Instructions: 1)  Please schedule a follow-up appointment in 3 months. 2)  It is important that you exercise regularly at least 20 minutes 5 times a week. If you develop chest pain, have severe difficulty breathing, or feel very tired , stop exercising immediately and seek medical attention. 3)  You need to lose weight. Consider a lower calorie diet and regular exercise.  4)  Check your blood sugars regularly. If your  readings are usually above 200 or below 70 you should contact our office. 5)  It is important that your Diabetic A1c level is checked every 3 months. 6)  See your eye doctor yearly to check for diabetic eye damage. 7)  Check your feet each night for sore areas, calluses or signs of infection. 8)  Check your Blood Pressure regularly. If it is above 130/80: you should make an appointment.   Orders Added: 1)  Venipuncture [36415] 2)  TLB-BMP (Basic Metabolic Panel-BMET) [80048-METABOL] 3)  TLB-Hepatic/Liver Function Pnl [80076-HEPATIC] 4)  TLB-TSH (Thyroid Stimulating Hormone) [84443-TSH] 5)  TLB-A1C / Hgb A1C (Glycohemoglobin) [83036-A1C] 6)  TLB-Udip w/ Micro [81001-URINE] 7)  T-Urine Culture (Spectrum Order) [[id number]] 8)  Est. Patient Level III [09811]

## 2010-07-22 NOTE — Progress Notes (Signed)
  Phone Note Refill Request Message from:  Patient on Nov 13, 2009 9:31 AM  Refills Requested: Medication #1:  ALPRAZOLAM 0.5 MG  TABS one tablet as needed   Supply Requested: 6 months   Last Refilled: 07/07/2009   Notes: last given #60 Is this ok? [facility] [address]   Follow-up for Phone Call        yes Follow-up by: [name] MD,  Nov 13, 2009 10:12 AM    Prescriptions: ALPRAZOLAM 0.5 MG  TABS (ALPRAZOLAM) one tablet as needed  #60 x 5   Entered by:   Rock Nephew CMA   Authorized by:   [name] MD   Signed by:   Rock Nephew CMA on 11/13/2009   Method used:   Telephoned to ...       [facility]  [address] [id number] ([facility]ail)       [address]       [place], [place]  [address]       Ph: [phone] or [phone]       Fax: [phone]   RxID:   [id number]

## 2010-07-22 NOTE — Assessment & Plan Note (Signed)
Summary: 1 month/ mbw   Copy to:  [place] Primary Provider/Referring Provider:  [name] MD  CC:  1 month follow up visit-Breathing-no changes; gives out of breathe easily still.[name]  History of Present Illness:  May 25, 2009- Abnormal CXR, Hx RUL CA, COPD, Allergic Rhinitis ...............Marland Kitchenhusband here Since last here she has felt better- Fever, chills and productive cough have resolved. Her "usual cough" remains. No more DOE than her usual with brisk acitivity. Denies hemoptysis now, or adenopathy. She took Avelox. CT 05/11/09- compared with 12/24/08- Increased subcarinal lymph node and questionable adenopathy R hilum, with incr bronchial wall thickening and mild nodularity RLL- infectious/ inflammatory.  October 01, 2009- Abnormal CXR, Hx RUL CA, COPD, Allergic rhinitis, RUL nodule/ PET 12/10...dtr here Since last here had benign pelvic cyst drained- Dr Retta Diones. Had "probably benign" right breast nodule on MM. Cough and congestion more blamed now on pollen if outside. More easily dyspneic if without O2 during day and she sleeps with it at 2 L. Denies fever, palpitation. After morning meds she coughs up clear mucus. No waking for breathing. She is on Spiriva. Being treated for impaired bladder emptying. Not using her rescue inhaler.  Nov 06, 2009- Abnl CXR, Hx RUL Ca, COPD, Allergic rhinitis, RUL nodule/ PET.....dtr here Since last here, she feels better. She doesn't use the Xopenex neb more than once daily.  She isn't clear why Dr Yetta Barre changed Qvar to Advair which makes her shakey without improving her dyspnea. She denies productive cough, wheeze or chest pain. CXR 10/01/09- stable with some scarring at right lung base.   Current Medications (verified): 1)  Alprazolam 0.5 Mg  Tabs (Alprazolam) .... One Tablet As Needed 2)  Calcium 600/vitamin D 600-400 Mg-Unit  Tabs (Calcium Carbonate-Vitamin D) .... Take 1 Tablet By Mouth Once A Day 3)  Centrum Silver   Tabs (Multiple  Vitamins-Minerals) .... Take 1 Tablet By Mouth Once A Day 4)  Levoxyl 100 Mcg  Tabs (Levothyroxine Sodium) .... Take 1 Tablet By Mouth Once A Day 5)  Oxygen .... 2l At Bedtime and As Needed 6)  Xopenex 1.25 Mg/34ml  Nebu (Levalbuterol Hcl) .... Inhale 1 Vial Via Hhn Three Times A Day and As Needed 7)  Aspirin Adult Low Strength 81 Mg Tbec (Aspirin) .... Take 1 By Mouth Once Daily 8)  Lovastatin 40 Mg Tabs (Lovastatin) .... Once Daily 9)  Advair Diskus 250-50 Mcg/dose Aepb (Fluticasone-Salmeterol) .... One Puff Two Times A Day For Asthma  Allergies (verified): 1)  ! Penicillin 2)  ! Sulfa 3)  ! Codeine 4)  ! Nitrofurantoin Monohyd Macro (Nitrofurantoin Monohyd Macro)  Past History:  Past Medical History: Last updated: 10/01/2009 Allergic Rhinitis COPD Hx bronchogenic carcinoma  resected RUL RUL nodule Pelvic cyst Bladder atony  Past Surgical History: Last updated: 10/01/2009 Right upper lobectomy 2005- Dr. Edwyna Shell hysterectomy spine surgery Needle aspiration pelvic cyst- Dr Retta Diones 2011  Family History: Last updated: 10/10/2007 father-heart disease brother and sister-rheumatism brother died- leukemiaa written for her at a [employer] is a  Social History: Last updated: 10/29/2008 Pt is married with children. Pt is retired from school system, [employer]. Quit smoking 2001.  Smoked for 42 yrs x 1ppd. Alcohol use-no Drug use-no Regular exercise-no  Risk Factors: Alcohol Use: 0 (07/30/2009) Exercise: no (10/29/2008)  Risk Factors: Smoking Status: quit (07/30/2009) Passive Smoke Exposure: no (07/30/2009)  Review of Systems      See HPI       The patient complains of dyspnea on exertion.  The patient denies anorexia, fever, weight loss, weight gain, vision loss, decreased hearing, hoarseness, chest pain, syncope, peripheral edema, prolonged cough, headaches, hemoptysis, abdominal pain, and severe indigestion/heartburn.    Vital Signs:  Patient profile:   77 year  old female Height:      67 inches Weight:      181 pounds BMI:     28.45 O2 Sat:      92 % on Room air Pulse rate:   102 / minute BP sitting:   110 / 60  (left arm) Cuff size:   regular  Vitals Entered By: Reynaldo Minium CMA (Nov 06, 2009 2:51 PM)  O2 Flow:  Room air  Physical Exam  Additional Exam:  General: A/Ox3; pleasant and cooperative, NAD, on room air- resting sat 92% SKIN: no rash, lesions NODES: no lymphadenopathy HEENT: [place]/AT, EOM- WNL, Conjuctivae- clear, PERRLA, TM-WNL, Nose- clear, Throat- clear and wnl, talkative, Mallampati  II NECK: Supple w/ fair ROM, JVD- none, normal carotid impulses w/o bruits Thyroid- CHEST: clear to P&A, but coughed once after deep breath and sounded wheezey with that. HEART: RRR, no m/g/r heard ABDOMEN: XLK:GMWN, nl pulses, no edema  NEURO: Grossly intact to observation        Impression & Recommendations:  Problem # 1:  COPD (ICD-496) She will decide if Advair is cough effective by using it for a few weeks then dropping back to Qvar. She still has a little cough which sounds wheezey, so she will probably need some regualar bronchodilator at least at times.  Problem # 2:  LUNG NODULE (ICD-518.89) This appears to be a stable old scar. We reviewed CXR together.  Other Orders: Est. Patient Level III (02725)  Patient Instructions: 1)  Please schedule a follow-up appointment in 6 months. 2)  Please call sooner if needed. Let us know if you prefer Advair or Qvar.

## 2010-07-22 NOTE — Progress Notes (Signed)
Summary: RESULTS  Phone Note Call from Patient Call back at Home Phone [phone]   Reason for Call: Lab or Test Results Summary of Call: Patient is requesting results of u/s.  Initial call taken by: Lamar Sprinkles, CMA,  August 10, 2009 2:41 PM  Follow-up for Phone Call        there is still a moderate amount of fluid in the left pelvis Follow-up by: [name] MD,  August 10, 2009 2:46 PM  Additional Follow-up for Phone Call Additional follow up Details #1::        Pt informed, she continues to have siginfigant pain when standing and walking. Describes it also as a pulling feeling. While at u/s the tech had pt empty bladder. Tech told her that the bladder did not empty and she was told to try to empty it again. Does pt need to referral to specialist?  Additional Follow-up by: Lamar Sprinkles, CMA,  August 10, 2009 6:46 PM  New Problems: ATONY OF BLADDER (ICD-596.4)   Additional Follow-up for Phone Call Additional follow up Details #2::    yes, urology Follow-up by: [name] MD,  August 11, 2009 7:53 AM  New Problems: ATONY OF BLADDER (ICD-596.4)  Appended Document: RESULTS Called to notify patient and number is busy.  Appended Document: RESULTS Patient notified.

## 2010-07-22 NOTE — Progress Notes (Signed)
Summary: RESULTS  Phone Note Call from Patient Call back at Home Phone [phone]   Summary of Call: Patient is requesting results of labs.  Initial call taken by: Lamar Sprinkles, CMA,  May 26, 2010 3:34 PM  Follow-up for Phone Call        they look good Follow-up by: [name] MD,  May 26, 2010 3:37 PM  Additional Follow-up for Phone Call Additional follow up Details #1::        Pt c/o increase in muscle pain since being on lovastatin.  She says she informed MD about this before and was told to continue med.   Should she continue med? When is she due for next lipid check?  Additional Follow-up by: Lamar Sprinkles, CMA,  May 26, 2010 5:36 PM    Additional Follow-up for Phone Call Additional follow up Details #2::    1. no, stop lovastatin 2. in 3-4 months Follow-up by: [name] MD,  May 27, 2010 7:45 AM  Additional Follow-up for Phone Call Additional follow up Details #3:: Details for Additional Follow-up Action Taken: Pt informed, transferred to scheduler for office visit  Additional Follow-up by: Lamar Sprinkles, CMA,  May 27, 2010 12:34 PM  New/Updated Medications: LOVASTATIN 40 MG TABS (LOVASTATIN) once daily -HOLD MED 05/26/10

## 2010-07-22 NOTE — Progress Notes (Signed)
Summary: pending surgery/ info for dr young  Phone Note Call from Patient Call back at [facility] Phone [phone]   Caller: Patient Call For: young Summary of Call: pt will be on a respirator (per pt's conversation w/ dr Drucilla Chalet) per pt. when having surgery on 2/2 at [facility] (cyst on lower abdomen). pt wants to make sure that dr young was sent this info from dr Ambrose Mantle. his ph# is [phone]. pt says that she had left a msg w/ dr Yetta Barre - her pcp as well, but wanted to make sure dr young had this info as well. pt says that dr Rockney Ghee (from [facility]) will perform this surgery at [facility].  Initial call taken by: Tivis Ringer, CNA,  July 15, 2009 4:12 PM  Follow-up for Phone Call        have you reviewed any notes from Dr.Henley about the pt upcoming surgery? Please advise. If not I can have them resent. Thanks. Carron Curie CMA  July 15, 2009 4:15 PM   Additional Follow-up for Phone Call Additional follow up Details #1::        I don't find any notes from Dr Ambrose Mantle. Additional Follow-up by: Waymon Budge MD,  July 16, 2009 1:20 PM  New Problems: LUNG NODULE (ICD-518.89)   Additional Follow-up for Phone Call Additional follow up Details #2::    Pt wants CY to review the U/S that was done by Dr. Yetta Barre. Pt just wanted to make CY aware of upcoming surgery on 07/22/09 and if he had any concerns about her breathing issues during surgery. I have printed the U/S report for CY to review. Carron Curie CMA  July 16, 2009 2:29 PM   Additional Follow-up for Phone Call Additional follow up Details #3:: Details for Additional Follow-up Action Taken: I spoke with her. She isn't clear, but it sounds as if she is to meet a visiting doctor Feb 2 and they will make decisons about management of her left adnexal mass. I discussed with her the CXR, CT and PET with question of subcarinal adenopathy and small R?UL mass in this woman with COPD and hx RLL resection for ca 6 years ago. She  understands increased risk of pulmonary complications with anticipated surgery. she will follow up for lungs afterward. She also sees Dr Yetta Barre and Dr Cyndie Chime. Additional Follow-up by: Waymon Budge MD,  July 17, 2009 1:48 PM  New Problems: LUNG NODULE [phone])

## 2010-07-22 NOTE — Progress Notes (Signed)
Summary: test results  Phone Note Call from Patient Call back at Home Phone [phone]   Caller: Patient Call For: young Summary of Call: Wants cxr results. Initial call taken by: Darletta Moll,  October 07, 2009 2:29 PM  Follow-up for Phone Call        Spoke with pt and notified hr of cxr results per Dr Roxy Cedar append.  Pt verbalized understanding. Follow-up by: Vernie Murders,  October 07, 2009 2:44 PM

## 2010-07-22 NOTE — Letter (Signed)
Summary: [facility]  [facility]   Imported By: Lester Harrisburg 03/05/2010 12:55:03  _____________________________________________________________________  External Attachment:    Type:   Image     Comment:   External Document

## 2010-07-22 NOTE — Progress Notes (Signed)
Summary: RF Prudy Feeler  Phone Note Call from Patient Call back at [facility] Phone [phone]   Summary of Call: Patient is requesting a call.  Initial call taken by: Lamar Sprinkles, CMA,  May 31, 2010 12:34 PM  Follow-up for Phone Call        Patient is requesting refill of alprazolam. Ok to call into [facility] [address]  Follow-up by: Lamar Sprinkles, CMA,  May 31, 2010 1:35 PM  Additional Follow-up for Phone Call Additional follow up Details #1::        yes Additional Follow-up by: [name] MD,  May 31, 2010 2:17 PM    Additional Follow-up for Phone Call Additional follow up Details #2::    called in, Pt informed  Follow-up by: Lamar Sprinkles, CMA,  May 31, 2010 2:20 PM  Prescriptions: ALPRAZOLAM 0.5 MG  TABS (ALPRAZOLAM) two tablet as needed  #60 x 5   Entered by:   Lamar Sprinkles, CMA   Authorized by:   [name] MD   Signed by:   Lamar Sprinkles, CMA on 05/31/2010   Method used:   Telephoned to ...       [id number] (retail)       [address]       [place], [place]  [address]       Ph: [phone] or [phone]       Fax: [phone]   RxID:   [id number]

## 2010-07-22 NOTE — Letter (Signed)
Summary: Lipid Letter  [facility]  [address] [place], [place] [address]   Phone: [phone]  Fax: [phone]    02/11/2010  [name] [address] [place], [place]  [address]  Dear [name]:  We have carefully reviewed your last lipid profile from 10/30/2009 and the results are noted below with a summary of recommendations for lipid management.    Cholesterol:       206     Goal: <200   HDL "good" Cholesterol:   25.36     Goal: >40   LDL "bad" Cholesterol:   112     Goal: <100   Triglycerides:       114.0     Goal: <150        TLC Diet (Therapeutic Lifestyle Change): Saturated Fats & Transfatty acids should be kept < 7% of total calories ***Reduce Saturated Fats Polyunstaurated Fat can be up to 10% of total calories Monounsaturated Fat Fat can be up to 20% of total calories Total Fat should be no greater than 25-35% of total calories Carbohydrates should be 50-60% of total calories Protein should be approximately 15% of total calories Fiber should be at least 20-30 grams a day ***Increased fiber may help lower LDL Total Cholesterol should be < 200mg /day Consider adding plant stanol/sterols to diet (example: Benacol spread) ***A higher intake of unsaturated fat may reduce Triglycerides and Increase HDL    Adjunctive Measures (may lower LIPIDS and reduce risk of Heart Attack) include: Aerobic Exercise (20-30 minutes 3-4 times a week) Limit Alcohol Consumption Weight Reduction Aspirin 75-81 mg a day by mouth (if not allergic or contraindicated) Dietary Fiber 20-30 grams a day by mouth     Current Medications: 1)    Alprazolam 0.5 Mg  Tabs (Alprazolam) .... Two tablet as needed 2)    Calcium 600/vitamin D 600-400 Mg-unit  Tabs (Calcium carbonate-vitamin d) .... Take 1 tablet by mouth once a day 3)    Centrum Silver   Tabs (Multiple vitamins-minerals) .... Take 1 tablet by mouth once a day 4)    Levoxyl 100 Mcg  Tabs (Levothyroxine sodium) ....  Take 1 tablet by mouth once a day 5)    Oxygen  .... 2l at bedtime and as needed 6)    Xopenex 1.25 Mg/50ml  Nebu (Levalbuterol hcl) .... Inhale 1 vial via hhn three times a day and as needed 7)    Aspirin Adult Low Strength 81 Mg Tbec (Aspirin) .... Take 1 by mouth once daily 8)    Lovastatin 40 Mg Tabs (Lovastatin) .... Once daily 9)    Qvar 80 Mcg/act Aers (Beclomethasone dipropionate) .... Inhale 2 puffs two times a day and rinse mouth well after use  If you have any questions, please call. We appreciate being able to work with you.   Sincerely,     [facility] [name] MD

## 2010-07-22 NOTE — Letter (Signed)
Summary: [facility]   Imported By: Sherian Rein 10/23/2009 09:08:07  _____________________________________________________________________  External Attachment:    Type:   Image     Comment:   External Document

## 2010-07-22 NOTE — Letter (Signed)
Summary: CMN for Oxygen / [facility]  CMN for Oxygen / [facility]   Imported By: Lennie Odor 03/18/2010 15:56:24  _____________________________________________________________________  External Attachment:    Type:   Image     Comment:   External Document

## 2010-07-22 NOTE — Letter (Addendum)
Summary: [facility]  [facility]   Imported By: Sherian Rein 07/08/2010 09:06:09  _____________________________________________________________________  External Attachment:    Type:   Image     Comment:   External Document

## 2010-07-22 NOTE — Letter (Signed)
Summary: Results Follow-up Letter  [facility]  [address] [place], [place] [address]   Phone: [phone]  Fax: [phone]    11/01/2009  [address] [place], [place]  [address]  Dear Ms. Pieri,   The following are the results of your recent test(s):  Test     Result     Kidney     normal Liver       one slightly elevated enzyme CBC       normal Thyroid     normal Blood sugars   normal  _________________________________________________________  Please call for an appointment as directed _________________________________________________________ _________________________________________________________ _________________________________________________________  Sincerely,  Sanda Linger MD [facility]

## 2010-07-22 NOTE — Assessment & Plan Note (Signed)
Summary: HEADACHE--STIFF NECK---D/T---STC   Vital Signs:  Patient profile:   77 year old female Height:      67 inches Weight:      182 pounds BMI:     28.61 O2 Sat:      98 % on Room air Temp:     97.6 degrees F oral Pulse rate:   89 / minute Pulse rhythm:   regular Resp:     16 per minute BP sitting:   112 / 66  (left arm) Cuff size:   large  Vitals Entered By: Rock Nephew CMA (Oct 30, 2009 10:15 AM)  Nutrition Counseling: Patient's BMI is greater than 25 and therefore counseled on weight management options.  O2 Flow:  Room air CC: cough w/ green mucus, URI symptoms Is Patient Diabetic? No   Primary Care Provider:  [name] MD  CC:  cough w/ green mucus and URI symptoms.  History of Present Illness:  URI Symptoms      This is a 77 year old woman who presents with URI symptoms.  The symptoms began 3 days ago.  The severity is described as moderate.  The patient reports sore throat and productive cough, but denies nasal congestion, clear nasal discharge, purulent nasal discharge, earache, and sick contacts.  Associated symptoms include dyspnea and wheezing.  The patient denies fever, stiff neck, rash, vomiting, diarrhea, use of an antipyretic, and response to antipyretic.  The patient denies seasonal symptoms, headache, muscle aches, and severe fatigue.  The patient denies the following risk factors for Strep sinusitis: unilateral facial pain, unilateral nasal discharge, double sickening, Strep exposure, tender adenopathy, and absence of cough.    Current Medications (verified): 1)  Alprazolam 0.5 Mg  Tabs (Alprazolam) .... One Tablet As Needed 2)  Calcium 600/vitamin D 600-400 Mg-Unit  Tabs (Calcium Carbonate-Vitamin D) .... Take 1 Tablet By Mouth Once A Day 3)  Centrum Silver   Tabs (Multiple Vitamins-Minerals) .... Take 1 Tablet By Mouth Once A Day 4)  Levoxyl 100 Mcg  Tabs (Levothyroxine Sodium) .... Take 1 Tablet By Mouth Once A Day 5)  Oxygen .... 2l At Bedtime  and As Needed 6)  Xopenex 1.25 Mg/26ml  Nebu (Levalbuterol Hcl) .... Inhale 1 Vial Via Hhn Three Times A Day and As Needed 7)  Qvar 80 Mcg/act  Aers (Beclomethasone Dipropionate) .... 2 Puffs and Rinse Twice Daily 8)  Aspirin Adult Low Strength 81 Mg Tbec (Aspirin) .... Take 1 By Mouth Once Daily 9)  Lovastatin 40 Mg Tabs (Lovastatin) .... Once Daily 10)  Prednisone 10 Mg Tabs (Prednisone) .[name].. 1 Daily  Allergies: 1)  ! Penicillin 2)  ! Sulfa 3)  ! Codeine 4)  ! Nitrofurantoin Monohyd Macro (Nitrofurantoin Monohyd Macro)  Past History:  Past Medical History: Reviewed history from 10/01/2009 and no changes required. Allergic Rhinitis COPD Hx bronchogenic carcinoma  resected RUL RUL nodule Pelvic cyst Bladder atony  Past Surgical History: Reviewed history from 10/01/2009 and no changes required. Right upper lobectomy 2005- Dr. Edwyna Shell hysterectomy spine surgery Needle aspiration pelvic cyst- Dr Retta Diones 2011  Family History: Reviewed history from 10/10/2007 and no changes required. father-heart disease brother and sister-rheumatism brother died- leukemiaa written for her at a [facility] Dear is a  Social History: Reviewed history from 10/29/2008 and no changes required. Pt is married with children. Pt is retired from school system, [employer]. Quit smoking 2001.  Smoked for 42 yrs x 1ppd. Alcohol use-no Drug use-no Regular exercise-no  Review of Systems  The patient complains of dyspnea on exertion and prolonged cough.  The patient denies anorexia, fever, weight loss, chest pain, peripheral edema, headaches, hemoptysis, abdominal pain, hematuria, difficulty walking, depression, and enlarged lymph nodes.   Resp:  Complains of cough, shortness of breath, sputum productive, and wheezing; denies chest discomfort, chest pain with inspiration, coughing up blood, excessive snoring, and pleuritic. Endo:  Denies cold intolerance, excessive hunger, excessive thirst, excessive  urination, heat intolerance, polyuria, and weight change.  Physical Exam  General:  alert, well-developed, well-nourished, well-hydrated, appropriate dress, healthy-appearing, cooperative to examination, and good hygiene.   Head:  normocephalic and atraumatic.   Ears:  R ear normal and L ear normal.   Nose:  no external deformity, no nasal discharge, no mucosal pallor, no mucosal edema, no airflow obstruction, no intranasal foreign body, no nasal polyps, no active bleeding or clots, no sinus percussion tenderness, and no septum abnormalities.   Mouth:  Oral mucosa and oropharynx without lesions or exudates.  Teeth in good repair. Neck:  supple, full ROM, no masses, no thyromegaly, and no thyroid nodules or tenderness.   Lungs:  She has bilateral, expiratory rhonchi but good air movement and no rales.  normal respiratory effort, no intercostal retractions, no accessory muscle use, no dullness, and no crackles.   Heart:  normal rate, regular rhythm, no murmur, no gallop, and no rub.   Abdomen:  soft, non-tender, normal bowel sounds, no distention, no masses, no guarding, no rigidity, no rebound tenderness, no hepatomegaly, and no splenomegaly.   Msk:  normal ROM, no joint tenderness, no joint swelling, no joint warmth, no redness over joints, no joint deformities, no joint instability, no crepitation, and no muscle atrophy.   Pulses:  R and L carotid,radial,femoral,dorsalis pedis and posterior tibial pulses are full and equal bilaterally Extremities:  No clubbing, cyanosis, edema, or deformity noted with normal full range of motion of all joints.   Neurologic:  No cranial nerve deficits noted. Station and gait are normal. Plantar reflexes are down-going bilaterally. DTRs are symmetrical throughout. Sensory, motor and coordinative functions appear intact. Skin:  Intact without suspicious lesions or rashes Cervical Nodes:  No lymphadenopathy noted Psych:  Cognition and judgment appear intact. Alert  and cooperative with normal attention span and concentration. No apparent delusions, illusions, hallucinations   Impression & Recommendations:  Problem # 1:  COPD (ICD-496) Assessment Deteriorated  The following medications were removed from the medication list:    Qvar 80 Mcg/act Aers (Beclomethasone dipropionate) .[name]... 2 puffs and rinse twice daily    Spiriva Handihaler 18 Mcg Caps (Tiotropium bromide monohydrate) ..... One puff once daily Her updated medication list for this problem includes:    Xopenex 1.25 Mg/42ml Nebu (Levalbuterol hcl) ..... Inhale 1 vial via hhn three times a day and as needed    Advair Diskus 250-50 Mcg/dose Aepb (Fluticasone-salmeterol) ..... One puff two times a day for asthma  Problem # 2:  HYPERGLYCEMIA, BORDERLINE (ICD-790.29) Assessment: Unchanged  Orders: Venipuncture ([address]) TLB-Lipid Panel (80061-LIPID) TLB-BMP (Basic Metabolic Panel-BMET) (80048-METABOL) TLB-CBC Platelet - w/Differential (85025-CBCD) TLB-Hepatic/Liver Function Pnl (80076-HEPATIC) TLB-TSH (Thyroid Stimulating Hormone) (84443-TSH) TLB-A1C / Hgb A1C (Glycohemoglobin) (83036-A1C)  Labs Reviewed: Creat: 0.9 (04/01/2009)     Problem # 3:  PURE HYPERCHOLESTEROLEMIA (ICD-272.0) Assessment: Unchanged  Her updated medication list for this problem includes:    Lovastatin 40 Mg Tabs (Lovastatin) ..... Once daily  Orders: Venipuncture ([address]) TLB-Lipid Panel (80061-LIPID) TLB-BMP (Basic Metabolic Panel-BMET) (80048-METABOL) TLB-CBC Platelet - w/Differential (85025-CBCD) TLB-Hepatic/Liver Function Pnl (80076-HEPATIC) TLB-TSH (  Thyroid Stimulating Hormone) (84443-TSH) TLB-A1C / Hgb A1C (Glycohemoglobin) (83036-A1C)  Labs Reviewed: SGOT: 49 (04/01/2009)   SGPT: 26 (04/01/2009)  Lipid Goals: Chol Goal: 200 (11/27/2008)   HDL Goal: 40 (11/27/2008)   LDL Goal: 100 (11/27/2008)   TG Goal: 150 (11/27/2008)  Prior 10 Yr Risk Heart Disease: Not enough information (11/27/2008)   HDL:61.00  (10/29/2008), 75.1 (06/03/2008)  LDL:DEL (06/03/2008)  Chol:241 (10/29/2008), 262 (06/03/2008)  Trig:172.0 (10/29/2008), 109 (06/03/2008)  Problem # 4:  HYPOTHYROIDISM (ICD-244.9) Assessment: Unchanged  Her updated medication list for this problem includes:    Levoxyl 100 Mcg Tabs (Levothyroxine sodium) .[name]... Take 1 tablet by mouth once a day  Orders: Venipuncture ([address]) TLB-Lipid Panel (80061-LIPID) TLB-BMP (Basic Metabolic Panel-BMET) (80048-METABOL) TLB-CBC Platelet - w/Differential (85025-CBCD) TLB-Hepatic/Liver Function Pnl (80076-HEPATIC) TLB-TSH (Thyroid Stimulating Hormone) (84443-TSH) TLB-A1C / Hgb A1C (Glycohemoglobin) (83036-A1C)  Labs Reviewed: TSH: 3.87 (04/01/2009)    HgBA1c: 5.2 (04/01/2009) Chol: 241 (10/29/2008)   HDL: 61.00 (10/29/2008)   LDL: DEL (06/03/2008)   TG: 172.0 (10/29/2008)  Problem # 5:  ASTHMA, UNSPECIFIED, UNSPECIFIED STATUS (ICD-493.90) Assessment: Deteriorated  The following medications were removed from the medication list:    Qvar 80 Mcg/act Aers (Beclomethasone dipropionate) .[name]... 2 puffs and rinse twice daily    Spiriva Handihaler 18 Mcg Caps (Tiotropium bromide monohydrate) ..... One puff once daily Her updated medication list for this problem includes:    Xopenex 1.25 Mg/35ml Nebu (Levalbuterol hcl) ..... Inhale 1 vial via hhn three times a day and as needed    Prednisone 10 Mg Tabs (Prednisone) .[name]... 1 daily    Advair Diskus 250-50 Mcg/dose Aepb (Fluticasone-salmeterol) ..... One puff two times a day for asthma  Problem # 6:  BRONCHITIS-ACUTE (ICD-466.0) Assessment: New  The following medications were removed from the medication list:    Qvar 80 Mcg/act Aers (Beclomethasone dipropionate) .[name]... 2 puffs and rinse twice daily    Spiriva Handihaler 18 Mcg Caps (Tiotropium bromide monohydrate) ..... One puff once daily Her updated medication list for this problem includes:    Xopenex 1.25 Mg/51ml Nebu (Levalbuterol hcl) ..... Inhale 1 vial  via hhn three times a day and as needed    Advair Diskus 250-50 Mcg/dose Aepb (Fluticasone-salmeterol) ..... One puff two times a day for asthma    Avelox 400 Mg Tabs (Moxifloxacin hcl) ..... One by mouth once daily for 7 days  Complete Medication List: 1)  Alprazolam 0.5 Mg Tabs (Alprazolam) .... One tablet as needed 2)  Calcium 600/vitamin D 600-400 Mg-unit Tabs (Calcium carbonate-vitamin d) .... Take 1 tablet by mouth once a day 3)  Centrum Silver Tabs (Multiple vitamins-minerals) .... Take 1 tablet by mouth once a day 4)  Levoxyl 100 Mcg Tabs (Levothyroxine sodium) .... Take 1 tablet by mouth once a day 5)  Oxygen  .... 2l at bedtime and as needed 6)  Xopenex 1.25 Mg/10ml Nebu (Levalbuterol hcl) .... Inhale 1 vial via hhn three times a day and as needed 7)  Aspirin Adult Low Strength 81 Mg Tbec (Aspirin) .... Take 1 by mouth once daily 8)  Lovastatin 40 Mg Tabs (Lovastatin) .... Once daily 9)  Prednisone 10 Mg Tabs (Prednisone) .[name].. 1 daily 10)  Advair Diskus 250-50 Mcg/dose Aepb (Fluticasone-salmeterol) .... One puff two times a day for asthma 11)  Avelox 400 Mg Tabs (Moxifloxacin hcl) .... One by mouth once daily for 7 days  Patient Instructions: 1)  Please schedule a follow-up appointment in 2 weeks. Prescriptions: AVELOX 400 MG TABS (MOXIFLOXACIN HCL) One by  mouth once daily for 7 days  #7 x 0   Entered and Authorized by:   [name] MD   Signed by:   [name] MD on 10/30/2009   Method used:   Samples Given   RxID:   [id number] ADVAIR DISKUS 250-50 MCG/DOSE AEPB (FLUTICASONE-SALMETEROL) One puff two times a day for asthma  #6 inhs x 0   Entered and Authorized by:   [name] MD   Signed by:   [name] MD on 10/30/2009   Method used:   Samples Given   RxID:   [id number]

## 2010-07-22 NOTE — Letter (Signed)
Summary: [facility]  [facility]   Imported By: Lennie Odor 10/07/2009 11:41:30  _____________________________________________________________________  External Attachment:    Type:   Image     Comment:   External Document

## 2010-07-22 NOTE — Progress Notes (Signed)
Summary: Confirm rad reports  Phone Note Call from Patient Call back at Home Phone [phone]   Summary of Call: Spoke with patient and she wanted to confirm that Dr. Yetta Barre saw her radiology reports. I notified patient that he did sign those that he had seen them. I also advised patient to speak with her pulmonary MD in regards to her lungs and surgery/procedure on 07/22/09. Patient is aware of her office visit in feb 2011 with jones and will wait for return call from pulmonary. Initial call taken by: Lucious Groves,  July 15, 2009 4:24 PM

## 2010-07-22 NOTE — Progress Notes (Signed)
Summary: CXR results  Phone Note Call from Patient Call back at Home Phone [phone]   Caller: Patient Call For: YOUNG Summary of Call: results cxr.  Initial call taken by: Tivis Ringer, CNA,  March 17, 2010 12:02 PM  Follow-up for Phone Call        Pt informed of CXR results as stated in append from CY on 9.27.11 -- She verbalized understanding. Follow-up by: Gweneth Dimitri RN,  March 17, 2010 12:07 PM

## 2010-07-22 NOTE — Assessment & Plan Note (Signed)
Summary: increased sob/tightness in chest/mg   Copy to:  [facility] Primary Rendi Mapel/Referring Deyana Wnuk:  [name] MD  CC:  Accute visit-SOB, wheezing, nausea, not hungry, and sore in chest from coughing-productive-yellow..  History of Present Illness:  October 01, 2009- Abnormal CXR, Hx RUL CA, COPD, Allergic rhinitis, RUL nodule/ PET 12/10...dtr here Since last here had benign pelvic cyst drained- Dr Retta Diones. Had "probably benign" right breast nodule on MM. Cough and congestion more blamed now on pollen if outside. More easily dyspneic if without O2 during day and she sleeps with it at 2 L. Denies fever, palpitation. After morning meds she coughs up clear mucus. No waking for breathing. She is on Spiriva. Being treated for impaired bladder emptying. Not using her rescue inhaler.  Nov 06, 2009- Abnl CXR, Hx RUL Ca, COPD, Allergic rhinitis, RUL nodule/ PET.....dtr here Since last here, she feels better. She doesn't use the Xopenex neb more than once daily.  She isn't clear why Dr Yetta Barre changed Qvar to Advair which makes her shakey without improving her dyspnea. She denies productive cough, wheeze or chest pain. CXR 10/01/09- stable with some scarring at right lung base.  March 16, 2010-   Abnl CXR, Hx RUL Ca, COPD, Allergic rhinitis, RUL nodule/ PET                   .[name]..............Marland Kitchenhusand here Increased cough and more short of breath, needing to use her oxygen more, over past week. Blames Qvar for easy bruising. Coughed as she got into bed 3 nights ago. Coughed till she retched up her supper. This morning coughed and refluxed her coffee. Morning chest congestion. Using Xopenex neb once daily.; Stopped Advair- made her mouth sore. Pain to right of lower sternum x 4-5 days.   Asthma History    Asthma Control Assessment:    Age range: 12+ years    Symptoms: >2 days/week    Nighttime Awakenings: 0-2/month    Interferes w/ normal activity: some limitations    SABA use (not for  EIB): >2 days/week    Asthma Control Assessment: Not Well Controlled   Preventive Screening-Counseling & Management  Alcohol-Tobacco     Alcohol drinks/day: 0     Smoking Status: quit     Year Quit: 2003     Passive Smoke Exposure: no     Tobacco Counseling: to remain off tobacco products  Current Medications (verified): 1)  Alprazolam 0.5 Mg  Tabs (Alprazolam) .... Two Tablet As Needed 2)  Calcium 600/vitamin D 600-400 Mg-Unit  Tabs (Calcium Carbonate-Vitamin D) .... Take 1 Tablet By Mouth Once A Day 3)  Centrum Silver   Tabs (Multiple Vitamins-Minerals) .... Take 1 Tablet By Mouth Once A Day 4)  Levoxyl 100 Mcg  Tabs (Levothyroxine Sodium) .... Take 1 Tablet By Mouth Once A Day 5)  Oxygen .... 2l At Bedtime and As Needed 6)  Xopenex 1.25 Mg/86ml  Nebu (Levalbuterol Hcl) .... Inhale 1 Vial Via Hhn Three Times A Day and As Needed 7)  Aspirin Adult Low Strength 81 Mg Tbec (Aspirin) .... Take 1 By Mouth Once Daily 8)  Lovastatin 40 Mg Tabs (Lovastatin) .... Once Daily 9)  Qvar 80 Mcg/act Aers (Beclomethasone Dipropionate) .... Inhale 2 Puffs Two Times A Day and Rinse Mouth Well After Use  Allergies (verified): 1)  ! Penicillin 2)  ! Sulfa 3)  ! Codeine 4)  ! Nitrofurantoin Monohyd Macro (Nitrofurantoin Monohyd Macro)  Past History:  Past Medical History: Last updated: 10/01/2009 Allergic Rhinitis  COPD Hx bronchogenic carcinoma  resected RUL RUL nodule Pelvic cyst Bladder atony  Past Surgical History: Last updated: 10/01/2009 Right upper lobectomy 2005- Dr. Edwyna Shell hysterectomy spine surgery Needle aspiration pelvic cyst- Dr Retta Diones 2011  Family History: Last updated: 10/10/2007 father-heart disease brother and sister-rheumatism brother died- leukemiaa written for her at a [employer] is a  Social History: Last updated: 10/29/2008 Pt is married with children. Pt is retired from school system, [employer]. Quit smoking 2001.  Smoked for 42 yrs x 1ppd. Alcohol  use-no Drug use-no Regular exercise-no  Risk Factors: Alcohol Use: 0 (03/16/2010) Exercise: no (10/29/2008)  Risk Factors: Smoking Status: quit (03/16/2010) Passive Smoke Exposure: no (03/16/2010)  Review of Systems      See HPI       The patient complains of shortness of breath with activity, productive cough, non-productive cough, chest pain, and acid heartburn.  The patient denies shortness of breath at rest, coughing up blood, irregular heartbeats, indigestion, loss of appetite, weight change, abdominal pain, difficulty swallowing, sore throat, tooth/dental problems, nasal congestion/difficulty breathing through nose, and sneezing.    Vital Signs:  Patient profile:   77 year old female Height:      67 inches Weight:      182 pounds BMI:     28.61 O2 Sat:      94 % on Room air Pulse rate:   101 / minute BP sitting:   140 / 70  (left arm) Cuff size:   regular  Vitals Entered By: Reynaldo Minium CMA (March 16, 2010 3:24 PM)  O2 Flow:  Room air CC: Accute visit-SOB, wheezing,nausea, not hungry, sore in chest from coughing-productive-yellow.   Physical Exam  Additional Exam:  General: A/Ox3; pleasant and cooperative, NAD, on room air- resting sat 94% SKIN: no rash, lesions NODES: no lymphadenopathy HEENT: [place]/AT, EOM- WNL, Conjuctivae- clear, PERRLA, TM-WNL, Nose- clear, Throat- clear and wnl, talkative, Mallampati  II NECK: Supple w/ fair ROM, JVD- none, normal carotid impulses w/o bruits Thyroid- CHEST: wheeze, tender to presure lower sternum HEART: RRR, no m/g/r heard ABDOMEN:soft AVW:UJWJ, nl pulses, no edema  NEURO: Grossly intact to observation        Impression & Recommendations:  Problem # 1:  COPD (ICD-496) Question if she may have aspirated to explain the increased cough and tussive anterior chest wall pain. Will get CXR and give tesalon. She is either refluxing to cause cough, coughibg until she retches, or both. Reflux precautions and cough control  discussed. Flu vax discussion.  Problem # 2:  ATONY OF BLADDER (ICD-596.4) Avoiding Spiriva because of this.  Problem # 3:  LUNG CANCER, UPPER LOBE (ICD-162.3) We have not yet seen a recurrence.  Medications Added to Medication List This Visit: 1)  Tessalon Perles 100 Mg Caps (Benzonatate) .[name].. 1-2 four times a day as needed cough  Other Orders: Est. Patient Level IV (19147) Flu Vaccine 80yrs + MEDICARE PATIENTS (W2956) Administration Flu vaccine - MCR (G0008) T-2 View CXR (71020TC)  Patient Instructions: 1)  Keep November appointment- earlier if needed 2)  Flu vax 3)  Script for cough medicine sent to your drug store Prescriptions: TESSALON PERLES 100 MG CAPS (BENZONATATE) 1-2 four times a day as needed cough  #30 x 2   Entered and Authorized by:   Waymon Budge MD   Signed by:   Waymon Budge MD on 03/16/2010   Method used:   [facility]  [address] [id number]       [address]       [place], [place]  [address]       Ph: [phone] or [phone]       Fax: [phone]   RxID:   [id number]   Flu Vaccine Consent Questions     Do you have a history of severe allergic reactions to this vaccine? no    Any prior history of allergic reactions to egg and/or gelatin? no    Do you have a sensitivity to the preservative Thimersol? no    Do you have a past history of Guillan-Barre Syndrome? no    Do you currently have an acute febrile illness? no    Have you ever had a severe reaction to latex? no    Vaccine information given and explained to patient? yes    Are you currently pregnant? no    Lot Number:[id number]   Exp Date:12/18/2010   Site Given  Left Deltoid IMflu Reynaldo Minium CMA  March 16, 2010 5:35 PM

## 2010-07-22 NOTE — Progress Notes (Signed)
  Phone Note Call from Patient Call back at Home Phone [phone]   Summary of Call: Patient is requesting a call back regarding something we billed.  Initial call taken by: Lamar Sprinkles, CMA,  January 12, 2010 3:48 PM  Follow-up for Phone Call        Patient had billing charge, referred to [facility] billing for answers to questions Follow-up by: Lamar Sprinkles, CMA,  January 13, 2010 2:02 PM

## 2010-07-22 NOTE — Progress Notes (Signed)
  Phone Note Refill Request Message from:  Fax from Pharmacy on June 28, 2010 11:47 AM  Refills Requested: Medication #1:  ALPRAZOLAM 0.5 MG  TABS two tablet as needed   Dosage confirmed as above?Dosage Confirmed   Supply Requested: 3 months  Medication #2:  LEVOXYL 100 MCG  TABS Take 1 tablet by mouth once a day   Dosage confirmed as above?Dosage Confirmed   Supply Requested: 3 months rx faxed to [facility] [phone]     Prescriptions: ALPRAZOLAM 0.5 MG  TABS (ALPRAZOLAM) two tablet as needed  #180 x 3   Entered by:   Rock Nephew CMA   Authorized by:   [name] MD   Signed by:   Rock Nephew CMA on 06/28/2010   Method used:   Handwritten   RxID:   [id number] LEVOXYL 100 MCG  TABS (LEVOTHYROXINE SODIUM) Take 1 tablet by mouth once a day  #90 x 4   Entered by:   Rock Nephew CMA   Authorized by:   [name] MD   Signed by:   Rock Nephew CMA on 06/28/2010   Method used:   Handwritten   RxID:   [id number]   Appended Document:  Spoke with [facility] who stated that controls can only be filled for six mos. Gave new info of 57ms/1rf instead of the original 3/la

## 2010-07-22 NOTE — Assessment & Plan Note (Signed)
Summary: 2 MO ROV  / # [name]   Vital Signs:  Patient profile:   77 year old female Height:      67 inches Weight:      184 pounds BMI:     28.92 O2 Sat:      90 % on Room air Temp:     97.8 degrees F oral Pulse rate:   81 / minute Pulse rhythm:   regular Resp:     16 per minute BP sitting:   126 / 70  (left arm) Cuff size:   large  Vitals Entered By: Rock Nephew CMA (July 30, 2009 1:17 PM)  Nutrition Counseling: Patient's BMI is greater than 25 and therefore counseled on weight management options.  O2 Flow:  Room air CC: low back pain, urinary freq   Primary Care Provider:  [name] MD  CC:  low back pain and urinary freq.  History of Present Illness: She returns for a f/up after a CT guided drainage of a benign (normal pathology) cyst in her left adnexa. She still has discomfort in the area and is concerned it may have accumulated again.  Preventive Screening-Counseling & Management  Alcohol-Tobacco     Alcohol drinks/day: 0     Smoking Status: quit     Year Quit: 2003     Passive Smoke Exposure: no  Hep-HIV-STD-Contraception     HIV Risk: no  Current Medications (verified): 1)  Alprazolam 0.5 Mg  Tabs (Alprazolam) .... One Tablet As Needed 2)  Calcium 600/vitamin D 600-400 Mg-Unit  Tabs (Calcium Carbonate-Vitamin D) .... Take 1 Tablet By Mouth Once A Day 3)  Estrace 0.5 Mg Tabs (Estradiol) .... Qd 4)  Centrum Silver   Tabs (Multiple Vitamins-Minerals) .... Take 1 Tablet By Mouth Once A Day 5)  Levoxyl 100 Mcg  Tabs (Levothyroxine Sodium) .... Take 1 Tablet By Mouth Once A Day 6)  Oxygen .... 2l At Bedtime and As Needed 7)  Xopenex 1.25 Mg/15ml  Nebu (Levalbuterol Hcl) .... Inhale 1 Vial Via Hhn Three Times A Day and As Needed 8)  Qvar 80 Mcg/act  Aers (Beclomethasone Dipropionate) .... 2 Puffs and Rinse Twice Daily 9)  Spiriva Handihaler 18 Mcg Caps (Tiotropium Bromide Monohydrate) .... One Puff Once Daily 10)  Aspirin Adult Low Strength 81 Mg Tbec  (Aspirin) .... Take 1 By Mouth Once Daily 11)  Lovastatin 40 Mg Tabs (Lovastatin) .... Once Daily  Allergies (verified): 1)  ! Penicillin 2)  ! Sulfa 3)  ! Codeine  Past History:  Past Medical History: Reviewed history from 10/10/2007 and no changes required. Allergic Rhinitis Emphysema bronchogenic carcinoma  Past Surgical History: Reviewed history from 10/10/2007 and no changes required. Right upper lobectomy 2005- Dr. Edwyna Shell hysterectomy spine surgery  Family History: Reviewed history from 10/10/2007 and no changes required. father-heart disease brother and sister-rheumatism brother died- leukemiaa written for her at a [employer] is a  Social History: Reviewed history from 10/29/2008 and no changes required. Pt is married with children. Pt is retired from school system, [employer]. Quit smoking 2001.  Smoked for 42 yrs x 1ppd. Alcohol use-no Drug use-no Regular exercise-no  Review of Systems  The patient denies anorexia, fever, weight loss, chest pain, prolonged cough, hemoptysis, hematuria, abnormal bleeding, and enlarged lymph nodes.   General:  Denies chills, fatigue, fever, loss of appetite, malaise, sweats, and weight loss. GI:  Complains of abdominal pain; denies diarrhea, loss of appetite, nausea, vomiting, vomiting blood, and yellowish skin color. GU:  Complains of urinary frequency; denies discharge, dysuria, hematuria, nocturia, and urinary hesitancy.  Physical Exam  General:  alert, well-developed, well-nourished, well-hydrated, appropriate dress, healthy-appearing, cooperative to examination, and good hygiene.   Mouth:  Oral mucosa and oropharynx without lesions or exudates.  Teeth in good repair. Neck:  supple, full ROM, no masses, no thyromegaly, no JVD, no carotid bruits, and no cervical lymphadenopathy.   Lungs:  normal respiratory effort, no intercostal retractions, no accessory muscle use, normal breath sounds, no dullness, no crackles, and no  wheezes.   Heart:  normal rate, regular rhythm, no murmur, no gallop, and no rub.   Abdomen:  soft, non-tender, normal bowel sounds, no distention, no masses, no guarding, no rigidity, no rebound tenderness, no hepatomegaly, and no splenomegaly.   Msk:  normal ROM, no joint tenderness, no joint swelling, no joint warmth, and no redness over joints.   Extremities:  No clubbing, cyanosis, edema, or deformity noted with normal full range of motion of all joints.   Skin:  the sight of the needle insertion is seen over the left gluteal area at the ilio-sacral junction and is a small (2cm) area of ecchymosis with no ttp, erythema, warmth, or exudate. Axillary Nodes:  no R axillary adenopathy and no L axillary adenopathy.   Psych:  Cognition and judgment appear intact. Alert and cooperative with normal attention span and concentration. No apparent delusions, illusions, hallucinations   Impression & Recommendations:  Problem # 1:  ADNEXAL MASS, LEFT (ICD-789.39) Assessment New pathology is negative but will re-scan to see if it has reaccumulated Orders: Radiology Referral (Radiology)  Problem # 2:  ELEVATED BLOOD PRESSURE WITHOUT DIAGNOSIS OF HYPERTENSION (ICD-796.2) Assessment: Improved  BP today: 126/70 Prior BP: 136/82 (05/25/2009)  Prior 10 Yr Risk Heart Disease: Not enough information (11/27/2008)  Labs Reviewed: Creat: 0.9 (04/01/2009) Chol: 241 (10/29/2008)   HDL: 61.00 (10/29/2008)   LDL: DEL (06/03/2008)   TG: 172.0 (10/29/2008)  Instructed in low sodium diet (DASH Handout) and behavior modification.    Complete Medication List: 1)  Alprazolam 0.5 Mg Tabs (Alprazolam) .... One tablet as needed 2)  Calcium 600/vitamin D 600-400 Mg-unit Tabs (Calcium carbonate-vitamin d) .... Take 1 tablet by mouth once a day 3)  Estrace 0.5 Mg Tabs (Estradiol) .... Qd 4)  Centrum Silver Tabs (Multiple vitamins-minerals) .... Take 1 tablet by mouth once a day 5)  Levoxyl 100 Mcg Tabs  (Levothyroxine sodium) .... Take 1 tablet by mouth once a day 6)  Oxygen  .... 2l at bedtime and as needed 7)  Xopenex 1.25 Mg/44ml Nebu (Levalbuterol hcl) .... Inhale 1 vial via hhn three times a day and as needed 8)  Qvar 80 Mcg/act Aers (Beclomethasone dipropionate) .... 2 puffs and rinse twice daily 9)  Spiriva Handihaler 18 Mcg Caps (Tiotropium bromide monohydrate) .... One puff once daily 10)  Aspirin Adult Low Strength 81 Mg Tbec (Aspirin) .... Take 1 by mouth once daily 11)  Lovastatin 40 Mg Tabs (Lovastatin) .... Once daily  Other Orders: UA Dipstick w/o Micro (manual) (81191)  Patient Instructions: 1)  Please schedule a follow-up appointment in 2 weeks.  Laboratory Results   Urine Tests    Routine Urinalysis   Color: lt. yellow Appearance: Clear Glucose: negative   (Normal Range: Negative) Bilirubin: negative   (Normal Range: Negative) Ketone: negative   (Normal Range: Negative) Spec. Gravity: <1.005   (Normal Range: 1.003-1.035) Blood: negative   (Normal Range: Negative) pH: 8.0   (Normal Range: 5.0-8.0) Protein: negative   (  Normal Range: Negative) Urobilinogen: 0.2   (Normal Range: 0-1) Nitrite: negative   (Normal Range: Negative) Leukocyte Esterace: negative   (Normal Range: Negative)

## 2010-07-22 NOTE — Assessment & Plan Note (Signed)
Summary: 3 months/apc   Copy to:  [facility] Primary Provider/Referring Provider:  [name] MD  CC:  follow up visit-cough-clear and wheezing x 3 days.[name]  History of Present Illness: 05/06/09-URI Symptoms      This is a 77 year old woman who presents with URI symptoms.  The symptoms began 5 days ago.  The severity is described as moderate.  The patient reports nasal congestion, purulent nasal discharge, sore throat, productive cough, and sick contacts, but denies earache.  Associated symptoms include fever, fever of 100.5-103 degrees, wheezing, and response to antipyretic.  The patient denies stiff neck, dyspnea, rash, vomiting, and diarrhea.  The patient denies headache, muscle aches, and severe fatigue.  The patient denies the following risk factors for Strep sinusitis: unilateral facial pain, unilateral nasal discharge, poor response to decongestant, double sickening, and tooth pain.    May 25, 2009- Abnormal CXR, Hx RUL CA, COPD, Allergic Rhinitis ...............Marland Kitchenhusband here Since last here she has felt better- Fever, chills and productive cough have resolved. Her "usual cough" remains. No more DOE than her usual with brisk acitivity. Denies hemoptysis now, or adenopathy. She took Avelox. CT 05/11/09- compared with 12/24/08- Increased subcarinal lymph node and questionable adenopathy R hilum, with incr bronchial wall thickening and mild nodularity RLL- infectious/ inflammatory.  October 01, 2009- Abnormal CXR, Hx RUL CA, COPD, Allergic rhinitis, RUL nodule/ PET 12/10...dtr here Since last here had benign pelvic cyst drained- Dr Retta Diones. Had "probably benign" right breast nodule on MM. Cough and congestion more blamed now on pollen if outside. More easily dyspneic if without O2 during day and she sleeps with it at 2 L. Denies fever, palpitation. After morning meds she coughs up clear mucus. No waking for breathing. She is on Spiriva. Being treated for impaired bladder emptying. Not  using her rescue inhaler.       Current Medications (verified): 1)  Alprazolam 0.5 Mg  Tabs (Alprazolam) .... One Tablet As Needed 2)  Calcium 600/vitamin D 600-400 Mg-Unit  Tabs (Calcium Carbonate-Vitamin D) .... Take 1 Tablet By Mouth Once A Day 3)  Centrum Silver   Tabs (Multiple Vitamins-Minerals) .... Take 1 Tablet By Mouth Once A Day 4)  Levoxyl 100 Mcg  Tabs (Levothyroxine Sodium) .... Take 1 Tablet By Mouth Once A Day 5)  Oxygen .... 2l At Bedtime and As Needed 6)  Xopenex 1.25 Mg/34ml  Nebu (Levalbuterol Hcl) .... Inhale 1 Vial Via Hhn Three Times A Day and As Needed 7)  Qvar 80 Mcg/act  Aers (Beclomethasone Dipropionate) .... 2 Puffs and Rinse Twice Daily 8)  Spiriva Handihaler 18 Mcg Caps (Tiotropium Bromide Monohydrate) .... One Puff Once Daily 9)  Aspirin Adult Low Strength 81 Mg Tbec (Aspirin) .... Take 1 By Mouth Once Daily 10)  Lovastatin 40 Mg Tabs (Lovastatin) .... Once Daily  Allergies (verified): 1)  ! Penicillin 2)  ! Sulfa 3)  ! Codeine  Past History:  Family History: Last updated: 10/10/2007 father-heart disease brother and sister-rheumatism brother died- leukemiaa written for her at a [employer] is a  Social History: Last updated: 10/29/2008 Pt is married with children. Pt is retired from school system, [employer]. Quit smoking 2001.  Smoked for 42 yrs x 1ppd. Alcohol use-no Drug use-no Regular exercise-no  Risk Factors: Alcohol Use: 0 (07/30/2009) Exercise: no (10/29/2008)  Risk Factors: Smoking Status: quit (07/30/2009) Passive Smoke Exposure: no (07/30/2009)  Past Medical History: Allergic Rhinitis COPD Hx bronchogenic carcinoma  resected RUL RUL nodule Pelvic cyst Bladder atony  Past Surgical History: Right upper lobectomy 2005- Dr. Edwyna Shell hysterectomy spine surgery Needle aspiration pelvic cyst- Dr Retta Diones 2011  Review of Systems      See HPI       The patient complains of dyspnea on exertion and prolonged cough.  The  patient denies anorexia, fever, weight loss, weight gain, vision loss, decreased hearing, hoarseness, chest pain, syncope, peripheral edema, headaches, hemoptysis, abdominal pain, and severe indigestion/heartburn.    Vital Signs:  Patient profile:   77 year old female Height:      67 inches Weight:      187.50 pounds BMI:     29.47 O2 Sat:      94 % on Room air Pulse rate:   88 / minute BP sitting:   160 / 84  (left arm) Cuff size:   regular  Vitals Entered By: Reynaldo Minium CMA (October 01, 2009 4:06 PM)  O2 Flow:  Room air  Physical Exam  Additional Exam:  General: A/Ox3; pleasant and cooperative, NAD, on room air- resting sat 94% SKIN: no rash, lesions NODES: no lymphadenopathy HEENT: [place]/AT, EOM- WNL, Conjuctivae- clear, PERRLA, TM-WNL, Nose- clear, Throat- clear and wnl, talkative, Mallampati  II NECK: Supple w/ fair ROM, JVD- none, normal carotid impulses w/o bruits Thyroid- CHEST: bilateral inspiratory wheeze. HEART: RRR, no m/g/r heard ABDOMEN: XLK:GMWN, nl pulses, no edema  NEURO: Grossly intact to observation        Impression & Recommendations:  Problem # 1:  LUNG NODULE (ICD-518.89) Key concern is possible lung cancer recurrence. We will repeat CXR for this and consider need for CT when she returns.  Orders: Est. Patient Level III (02725) T-2 View CXR (71020TC)  Problem # 2:  COPD (ICD-496) I doubt that Spiriva is aggravating her bladder atony problem, but will let her try off Spiriva for comparison. Since she already has some wheeze, I am concerned about leaving her unprotected. I will have her take low dose prednisone til the bladder issue is resovlved.  Theophylline trial could be considered.  Medications Added to Medication List This Visit: 1)  Prednisone 10 Mg Tabs (Prednisone) .[name].. 1 daily  Other Orders: Prescription Created Electronically [phone])  Patient Instructions: 1)  Please schedule a follow-up appointment in 1 month. 2)  A chest x-ray  has been recommended.  Your imaging study may require preauthorization.  3)  Try off Spiriva for a week to see how much that affects your breathing and your bladder. 4)  If your breathing seems worse, start the prednisone- script sent to drug store. Prescriptions: PREDNISONE 10 MG TABS (PREDNISONE) 1 daily  #30 x 0   Entered and Authorized by:   Waymon Budge MD   Signed by:   Waymon Budge MD on 10/01/2009   Method used:   [facility]  [address] [id number]       [address]       [place], [place]  [address]       Ph: [phone] or [phone]       Fax: [phone]   RxID:   [id number]

## 2010-07-22 NOTE — Assessment & Plan Note (Signed)
Summary: 6 months/apc   Copy to:  [facility] Primary Provider/Referring Provider:  [name] MD  CC:  6 month follow up visit-COPD; recent stomach virus-last about 3-4 days; breathing about the same-I noticed some labored breathing when pt entered room today.[name]  History of Present Illness:  Nov 06, 2009- Abnl CXR, Hx RUL Ca, COPD, Allergic rhinitis, RUL nodule/ PET.....dtr here Since last here, she feels better. She doesn't use the Xopenex neb more than once daily.  She isn't clear why Dr Yetta Barre changed Qvar to Advair which makes her shakey without improving her dyspnea. She denies productive cough, wheeze or chest pain. CXR 10/01/09- stable with some scarring at right lung base.  March 16, 2010-   Abnl CXR, Hx RUL Ca, COPD, Allergic rhinitis, RUL nodule/ PET                   .[name]..............Marland Kitchenhusand here Increased cough and more short of breath, needing to use her oxygen more, over past week. Blames Qvar for easy bruising. Coughed as she got into bed 3 nights ago. Coughed till she retched up her supper. This morning coughed and refluxed her coffee. Morning chest congestion. Using Xopenex neb once daily.; Stopped Advair- made her mouth sore. Pain to right of lower sternum x 4-5 days.   May 06, 2010- Abnl CXR, Hx RUL Ca, COPD, Allergic rhinitis, RUL nodule/ PET  ....husband here Nurse-CC: 6 month follow up visit-COPD; recent stomach virus-last about 3-4 days; breathing about the same-I noticed some labored breathing when pt entered room today. Breathing stable, though not as good im current rain. Had incidental gastroenteritis- lasted a week.  CXR-  Stable COPD, w/ right pleural thickening and some fibrosis, not changed. Got flu shot.  Daily morning cough- clear phlegm. Uses neb 1-2x/ day. Avoids Spiriva due to hx bladder atony. Uses O2 at night and as needed- needing it more lately.     Preventive Screening-Counseling & Management  Alcohol-Tobacco     Alcohol drinks/day:  0     Smoking Status: quit     Year Quit: 2003     Passive Smoke Exposure: no     Tobacco Counseling: to remain off tobacco products  Current Medications (verified): 1)  Alprazolam 0.5 Mg  Tabs (Alprazolam) .... Two Tablet As Needed 2)  Calcium 600/vitamin D 600-400 Mg-Unit  Tabs (Calcium Carbonate-Vitamin D) .... Take 1 Tablet By Mouth Once A Day 3)  Centrum Silver   Tabs (Multiple Vitamins-Minerals) .... Take 1 Tablet By Mouth Once A Day 4)  Levoxyl 100 Mcg  Tabs (Levothyroxine Sodium) .... Take 1 Tablet By Mouth Once A Day 5)  Oxygen .... 2l At Bedtime and As Needed 6)  Xopenex 1.25 Mg/12ml  Nebu (Levalbuterol Hcl) .... Inhale 1 Vial Via Hhn Three Times A Day and As Needed 7)  Aspirin Adult Low Strength 81 Mg Tbec (Aspirin) .... Take 1 By Mouth Once Daily 8)  Lovastatin 40 Mg Tabs (Lovastatin) .... Once Daily 9)  Qvar 80 Mcg/act Aers (Beclomethasone Dipropionate) .... Inhale 2 Puffs Two Times A Day and Rinse Mouth Well After Use 10)  Tessalon Perles 100 Mg Caps (Benzonatate) .[name].. 1-2 Four Times A Day As Needed Cough  Allergies (verified): 1)  ! Penicillin 2)  ! Sulfa 3)  ! Codeine 4)  ! Nitrofurantoin Monohyd Macro (Nitrofurantoin Monohyd Macro) 5)  ! Ampicillin  Past History:  Past Medical History: Last updated: 10/01/2009 Allergic Rhinitis COPD Hx bronchogenic carcinoma  resected RUL RUL nodule Pelvic cyst  Bladder atony  Family History: Last updated: 10/10/2007 father-heart disease brother and sister-rheumatism brother died- leukemiaa written for her at a [employer] is a  Social History: Last updated: 10/29/2008 Pt is married with children. Pt is retired from school system, [employer]. Quit smoking 2001.  Smoked for 42 yrs x 1ppd. Alcohol use-no Drug use-no Regular exercise-no  Risk Factors: Alcohol Use: 0 (05/06/2010) Exercise: no (10/29/2008)  Risk Factors: Smoking Status: quit (05/06/2010) Passive Smoke Exposure: no (05/06/2010)  Past Surgical  History: Right upper lobectomy 2005- Dr. Edwyna Shell hysterectomy spine surgery Needle aspiration pelvic cyst- Dr Retta Diones 2011 cataract surgery.  Review of Systems      See HPI       The patient complains of shortness of breath with activity, productive cough, and fever.  The patient denies non-productive cough, coughing up blood, chest pain, irregular heartbeats, acid heartburn, indigestion, loss of appetite, weight change, abdominal pain, difficulty swallowing, sore throat, tooth/dental problems, headaches, nasal congestion/difficulty breathing through nose, and sneezing.    Vital Signs:  Patient profile:   77 year old female Height:      67 inches Weight:      182.25 pounds BMI:     28.65 O2 Sat:      95 % on Room air Pulse rate:   83 / minute BP sitting:   112 / 72  (left arm) Cuff size:   regular  Vitals Entered By: Reynaldo Minium CMA (May 06, 2010 1:41 PM)  O2 Flow:  Room air CC: 6 month follow up visit-COPD; recent stomach virus-last about 3-4 days; breathing about the same-I noticed some labored breathing when pt entered room today.   Physical Exam  Additional Exam:  General: A/Ox3; pleasant and cooperative, NAD, on room air- resting sat 94% SKIN: no rash, lesions NODES: no lymphadenopathy HEENT: [place]/AT, EOM- WNL, Conjuctivae- clear, PERRLA, TM-WNL, Nose- clear, Throat- clear and wnl, talkative, Mallampati  II NECK: Supple w/ fair ROM, JVD- none, normal carotid impulses w/o bruits Thyroid- CHEST: wheeze, tender to presure lower sternum HEART: RRR, no m/g/r heard ABDOMEN:soft ZOX:WRUE, nl pulses, no edema  NEURO: Grossly intact to observation        EKG  Procedure date:  03/16/2010  Findings:      DG CHEST 2 VIEW - [id number]   Clinical Data: History of COPD, emphysema, and lung nodule.  Follow- up.   CHEST - 2 VIEW   Comparison: 12/22/2009 study.   Findings: The cardiac silhouette is normal size and shape. There is a surgical clip in the right  hilar region.  Chronic volume loss, fibrosis, and pleural thickening are present in the right inferior hemithorax.  Left lung is free of infiltrates.  No pneumothorax is seen.  No left pleural disease is evident.  There is generalized hyperinflation configuration with flattening of the diaphragm on lateral image.  Nonaneurysmal aortic calcification is present. There is minimal degenerative spondylosis.  There is osteopenic appearance of the bones.  No adenopathy is seen.   IMPRESSION: Hyperinflation consistent with obstructive pulmonary disease. Chronic volume loss, pulmonary fibrosis, and pleural thickening in the inferior right hemithorax are stable.  No new lesion is evident.   Read By:  Crawford Givens,  M.D.     Released By:  Crawford Givens,  M.D.   Impression & Recommendations:  Problem # 1:  COPD (ICD-496) Persistent moderate bronchitis w/o obvious infection. I will let her try a small boost in steroid dose for a few days and give  a standby antibiotitic.  General concerns for stability of COPD with incoming winter weather were discussed. She did have flu vax in September, pneumovax 2007.  Problem # 2:  LUNG NODULE (ICD-518.89) With hx of lung cancer, we continue surveillance. No apparent change on latest CXR, reviewed. Imaging shows COPD and fibrosis. She continues follow-up with Dr Cyndie Chime for Oncology.  Medications Added to Medication List This Visit: 1)  Prednisone 10 Mg Tabs (Prednisone) .[name].. 1 daily x 7 days 2)  Zithromax Z-pak 250 Mg Tabs (Azithromycin) .... 2 today then one daily  Other Orders: Est. Patient Level III (60454)  Patient Instructions: 1)  Please schedule a follow-up appointment in 3 months. 2)  Script for prednisone taper and for antibiotic sent to drug store 3)  cc Dr Cyndie Chime Prescriptions: [name] Z-PAK 250 MG TABS (AZITHROMYCIN) 2 today then one daily  #1 pak x 0   Entered and Authorized by:   Waymon Budge MD   Signed by:   Waymon Budge  MD on 05/06/2010   Method used:   [facility]  [address] [id number]       [address]       [place], [place]  [address]       Ph: [phone] or [phone]       Fax: [phone]   RxID:   [id number] PREDNISONE 10 MG TABS (PREDNISONE) 1 daily x 7 days  #7 x 0   Entered and Authorized by:   Waymon Budge MD   Signed by:   Waymon Budge MD on 05/06/2010   Method used:   [facility]  [address] [id number]       [address]       [place], [place]  [address]       Ph: [phone] or [phone]       Fax: [phone]   RxID:   [id number]      EKG  Procedure date:  03/16/2010  Findings:      DG CHEST 2 VIEW - [id number]   Clinical Data: History of COPD, emphysema, and lung nodule.  Follow- up.   CHEST - 2 VIEW   Comparison: 12/22/2009 study.   Findings: The cardiac silhouette is normal size and shape. There is a surgical clip in the right hilar region.  Chronic volume loss, fibrosis, and pleural thickening are present in the right inferior hemithorax.  Left lung is free of infiltrates.  No pneumothorax is seen.  No left pleural disease is evident.  There is generalized hyperinflation configuration with flattening of the diaphragm on lateral image.  Nonaneurysmal aortic calcification is present. There is minimal degenerative spondylosis.  There is osteopenic appearance of the bones.  No adenopathy is seen.   IMPRESSION: Hyperinflation consistent with obstructive pulmonary disease. Chronic volume loss, pulmonary fibrosis, and pleural thickening in the inferior right hemithorax are stable.  No new lesion is evident.   Read By:  Crawford Givens,  M.D.     Released By:  Crawford Givens,  M.D.

## 2010-07-22 NOTE — Letter (Signed)
[facility]   Imported By: Sherian Rein 09/11/2009 10:15:17  _____________________________________________________________________  External Attachment:    Type:   Image     Comment:   External Document

## 2010-07-22 NOTE — Assessment & Plan Note (Signed)
Summary: SORE THROAT/ COUGH/NO FEVER / NWS   Vital Signs:  Patient profile:   77 year old female Height:      67 inches Weight:      177.50 pounds BMI:     27.90 O2 Sat:      93 % on Room air Temp:     97.1 degrees F oral Pulse rate:   100 / minute Pulse rhythm:   regular Resp:     20 per minute BP sitting:   102 / 60  (left arm) Cuff size:   large  Vitals Entered By: Rock Nephew CMA (Nov 17, 2009 10:40 AM)  O2 Flow:  Room air  Primary Care Provider:  [name] MD  CC:  URI symptoms.  History of Present Illness:  URI Symptoms      This is a 77 year old woman who presents with URI symptoms.  The symptoms began 1 week ago.  The severity is described as moderate.  The patient reports sore throat and productive cough, but denies nasal congestion, clear nasal discharge, purulent nasal discharge, earache, and sick contacts.  Associated symptoms include dyspnea and wheezing.  The patient denies fever, stiff neck, rash, vomiting, diarrhea, use of an antipyretic, and response to antipyretic.  The patient also reports sneezing and seasonal symptoms.  The patient denies headache, muscle aches, and severe fatigue.  The patient denies the following risk factors for Strep sinusitis: unilateral facial pain, unilateral nasal discharge, double sickening, tooth pain, Strep exposure, tender adenopathy, and absence of cough.    Dog bite on left forearm from 5 days ago. She thinks it is healing well. It was the family dog.  Preventive Screening-Counseling & Management  Alcohol-Tobacco     Alcohol drinks/day: 0     Smoking Status: quit     Year Quit: 2003     Passive Smoke Exposure: no  Hep-HIV-STD-Contraception     Hepatitis Risk: no risk noted     HIV Risk: no      Drug Use:  no.    Clinical Review Panels:  Lipid Management   Cholesterol:  163 (10/30/2009)   LDL (bad choesterol):  71 (10/30/2009)   HDL (good cholesterol):  74.60 (10/30/2009)  Diabetes Management   HgBA1C:   5.5 (10/30/2009)   Creatinine:  0.8 (10/30/2009)   Last Flu Vaccine:  Fluvax 3+ (04/01/2009)   Last Pneumovax:  Pneumovax (02/21/2006)  CBC   WBC:  7.8 (10/30/2009)   RBC:  3.91 (10/30/2009)   Hgb:  13.1 (10/30/2009)   Hct:  38.1 (10/30/2009)   Platelets:  234.0 (10/30/2009)   MCV  97.5 (10/30/2009)   MCHC  34.4 (10/30/2009)   RDW  13.4 (10/30/2009)   PMN:  66.0 (10/30/2009)   Lymphs:  22.1 (10/30/2009)   Monos:  9.2 (10/30/2009)   Eosinophils:  1.7 (10/30/2009)   Basophil:  1.0 (10/30/2009)  Complete Metabolic Panel   Glucose:  83 (10/30/2009)   Sodium:  138 (10/30/2009)   Potassium:  4.3 (10/30/2009)   Chloride:  95 (10/30/2009)   CO2:  33 (10/30/2009)   BUN:  8 (10/30/2009)   Creatinine:  0.8 (10/30/2009)   Albumin:  3.4 (10/30/2009)   Total Protein:  7.1 (10/30/2009)   Calcium:  9.8 (10/30/2009)   Total Bili:  0.6 (10/30/2009)   Alk Phos:  102 (10/30/2009)   SGPT (ALT):  48 (10/30/2009)   SGOT (AST):  33 (10/30/2009)   Medications Prior to Update: 1)  Alprazolam 0.5 Mg  Tabs (  Alprazolam) .... One Tablet As Needed 2)  Calcium 600/vitamin D 600-400 Mg-Unit  Tabs (Calcium Carbonate-Vitamin D) .... Take 1 Tablet By Mouth Once A Day 3)  Centrum Silver   Tabs (Multiple Vitamins-Minerals) .... Take 1 Tablet By Mouth Once A Day 4)  Levoxyl 100 Mcg  Tabs (Levothyroxine Sodium) .... Take 1 Tablet By Mouth Once A Day 5)  Oxygen .... 2l At Bedtime and As Needed 6)  Xopenex 1.25 Mg/70ml  Nebu (Levalbuterol Hcl) .... Inhale 1 Vial Via Hhn Three Times A Day and As Needed 7)  Aspirin Adult Low Strength 81 Mg Tbec (Aspirin) .... Take 1 By Mouth Once Daily 8)  Lovastatin 40 Mg Tabs (Lovastatin) .... Once Daily 9)  Advair Diskus 250-50 Mcg/dose Aepb (Fluticasone-Salmeterol) .... One Puff Two Times A Day For Asthma  Current Medications (verified): 1)  Alprazolam 0.5 Mg  Tabs (Alprazolam) .... Two Tablet As Needed 2)  Calcium 600/vitamin D 600-400 Mg-Unit  Tabs (Calcium  Carbonate-Vitamin D) .... Take 1 Tablet By Mouth Once A Day 3)  Centrum Silver   Tabs (Multiple Vitamins-Minerals) .... Take 1 Tablet By Mouth Once A Day 4)  Levoxyl 100 Mcg  Tabs (Levothyroxine Sodium) .... Take 1 Tablet By Mouth Once A Day 5)  Oxygen .... 2l At Bedtime and As Needed 6)  Xopenex 1.25 Mg/43ml  Nebu (Levalbuterol Hcl) .... Inhale 1 Vial Via Hhn Three Times A Day and As Needed 7)  Aspirin Adult Low Strength 81 Mg Tbec (Aspirin) .... Take 1 By Mouth Once Daily 8)  Lovastatin 40 Mg Tabs (Lovastatin) .... Once Daily 9)  Advair Diskus 250-50 Mcg/dose Aepb (Fluticasone-Salmeterol) .... One Puff Two Times A Day For Asthma 10)  Doxycycline Hyclate 100 Mg Tabs (Doxycycline Hyclate) .... One By Mouth Two Times A Day For 10 Days  Allergies (verified): 1)  ! Penicillin 2)  ! Sulfa 3)  ! Codeine 4)  ! Nitrofurantoin Monohyd Macro (Nitrofurantoin Monohyd Macro)  Past History:  Past Medical History: Reviewed history from 10/01/2009 and no changes required. Allergic Rhinitis COPD Hx bronchogenic carcinoma  resected RUL RUL nodule Pelvic cyst Bladder atony  Past Surgical History: Reviewed history from 10/01/2009 and no changes required. Right upper lobectomy 2005- Dr. Edwyna Shell hysterectomy spine surgery Needle aspiration pelvic cyst- Dr Retta Diones 2011  Family History: Reviewed history from 10/10/2007 and no changes required. father-heart disease brother and sister-rheumatism brother died- leukemiaa written for her at a [employer] is a  Social History: Reviewed history from 10/29/2008 and no changes required. Pt is married with children. Pt is retired from school system, [employer]. Quit smoking 2001.  Smoked for 42 yrs x 1ppd. Alcohol use-no Drug use-no Regular exercise-no Hepatitis Risk:  no risk noted  Review of Systems General:  Denies chills, fatigue, fever, loss of appetite, malaise, sweats, and weight loss. Resp:  Complains of cough, shortness of breath, sputum  productive, and wheezing; denies chest discomfort, chest pain with inspiration, coughing up blood, and pleuritic. MS:  Denies joint pain, joint redness, joint swelling, loss of strength, muscle aches, and thoracic pain. Endo:  Denies cold intolerance, excessive hunger, excessive thirst, excessive urination, heat intolerance, polyuria, and weight change.  Physical Exam  General:  alert, well-developed, well-nourished, well-hydrated, appropriate dress, healthy-appearing, cooperative to examination, and good hygiene.   Head:  normocephalic and atraumatic.   Ears:  R ear normal and L ear normal.   Nose:  no external deformity, no nasal discharge, no mucosal pallor, no mucosal edema, no airflow obstruction, no intranasal  foreign body, no nasal polyps, no active bleeding or clots, no sinus percussion tenderness, and no septum abnormalities.   Mouth:  Oral mucosa and oropharynx without lesions or exudates.  Teeth in good repair. Neck:  No deformities, masses, or tenderness noted. Lungs:  She has late, bilateral end-expiratory wheezes but good air movement and no decreased breath sounds. There are no rales. Heart:  normal rate, regular rhythm, no murmur, no gallop, and no rub.   Abdomen:  soft, non-tender, normal bowel sounds, no distention, no masses, no guarding, no rigidity, no rebound tenderness, no hepatomegaly, and no splenomegaly.   Msk:  left forearm shows a healing would that measures 3 cm and is granulating with no erythema, exudate, induration, fluctuance, warmth, or streaking. normal ROM, no joint tenderness, no joint swelling, no joint warmth, no redness over joints, no joint deformities, no joint instability, no crepitation, and no muscle atrophy.   Pulses:  R and L carotid,radial,femoral,dorsalis pedis and posterior tibial pulses are full and equal bilaterally Extremities:  No clubbing, cyanosis, edema, or deformity noted with normal full range of motion of all joints.   Neurologic:  No  cranial nerve deficits noted. Station and gait are normal. Plantar reflexes are down-going bilaterally. DTRs are symmetrical throughout. Sensory, motor and coordinative functions appear intact. Skin:  Intact without suspicious lesions or rashes Cervical Nodes:  no anterior cervical adenopathy and no posterior cervical adenopathy.   Axillary Nodes:  no R axillary adenopathy and no L axillary adenopathy.   Inguinal Nodes:  no R inguinal adenopathy and no L inguinal adenopathy.   Psych:  Cognition and judgment appear intact. Alert and cooperative with normal attention span and concentration. No apparent delusions, illusions, hallucinations   Impression & Recommendations:  Problem # 1:  DOG BITE (ICD-E906.0) Assessment New  Tdap, start Doxy.  Orders: Prescription Created Electronically [phone])  Problem # 2:  BRONCHITIS-ACUTE (ICD-466.0) Assessment: Deteriorated  Her updated medication list for this problem includes:    Xopenex 1.25 Mg/42ml Nebu (Levalbuterol hcl) ..... Inhale 1 vial via hhn three times a day and as needed    Advair Diskus 250-50 Mcg/dose Aepb (Fluticasone-salmeterol) ..... One puff two times a day for asthma    Doxycycline Hyclate 100 Mg Tabs (Doxycycline hyclate) ..... One by mouth two times a day for 10 days  Orders: Prescription Created Electronically [phone])  Problem # 3:  COPD (ICD-496) Assessment: Deteriorated give depo-medrol IM Her updated medication list for this problem includes:    Xopenex 1.25 Mg/51ml Nebu (Levalbuterol hcl) ..... Inhale 1 vial via hhn three times a day and as needed    Advair Diskus 250-50 Mcg/dose Aepb (Fluticasone-salmeterol) ..... One puff two times a day for asthma  Problem # 4:  ASTHMA, UNSPECIFIED, UNSPECIFIED STATUS (ICD-493.90) Assessment: Deteriorated  Her updated medication list for this problem includes:    Xopenex 1.25 Mg/52ml Nebu (Levalbuterol hcl) ..... Inhale 1 vial via hhn three times a day and as needed    Advair  Diskus 250-50 Mcg/dose Aepb (Fluticasone-salmeterol) ..... One puff two times a day for asthma  Problem # 5:  HYPOTHYROIDISM (ICD-244.9) Assessment: Unchanged  Her updated medication list for this problem includes:    Levoxyl 100 Mcg Tabs (Levothyroxine sodium) .[name]... Take 1 tablet by mouth once a day  Labs Reviewed: TSH: 1.19 (10/30/2009)    HgBA1c: 5.5 (10/30/2009) Chol: 163 (10/30/2009)   HDL: 74.60 (10/30/2009)   LDL: 71 (10/30/2009)   TG: 86.0 (10/30/2009)  Complete Medication List: 1)  Alprazolam 0.5 Mg  Tabs (Alprazolam) .... Two tablet as needed 2)  Calcium 600/vitamin D 600-400 Mg-unit Tabs (Calcium carbonate-vitamin d) .... Take 1 tablet by mouth once a day 3)  Centrum Silver Tabs (Multiple vitamins-minerals) .... Take 1 tablet by mouth once a day 4)  Levoxyl 100 Mcg Tabs (Levothyroxine sodium) .... Take 1 tablet by mouth once a day 5)  Oxygen  .... 2l at bedtime and as needed 6)  Xopenex 1.25 Mg/78ml Nebu (Levalbuterol hcl) .... Inhale 1 vial via hhn three times a day and as needed 7)  Aspirin Adult Low Strength 81 Mg Tbec (Aspirin) .... Take 1 by mouth once daily 8)  Lovastatin 40 Mg Tabs (Lovastatin) .... Once daily 9)  Advair Diskus 250-50 Mcg/dose Aepb (Fluticasone-salmeterol) .... One puff two times a day for asthma 10)  Doxycycline Hyclate 100 Mg Tabs (Doxycycline hyclate) .... One by mouth two times a day for 10 days  Patient Instructions: 1)  Please schedule a follow-up appointment in 2 weeks. 2)  Take your antibiotic as prescribed until ALL of it is gone, but stop if you develop a rash or swelling and contact our office as soon as possible. Prescriptions: ALPRAZOLAM 0.5 MG  TABS (ALPRAZOLAM) two tablet as needed  #60 x 5   Entered by:   Rock Nephew CMA   Authorized by:   [name] MD   Signed by:   Rock Nephew CMA on 11/17/2009   Method used:   [facility] ...       [id number]       [address]       [place], [place]  [address]       Ph: [phone] or [phone]       Fax: [phone]   RxID:   [id number] DOXYCYCLINE HYCLATE 100 MG TABS (DOXYCYCLINE HYCLATE) One by mouth two times a day for 10 days  #20 x 1   Entered and Authorized by:   [name] MD   Signed by:   [name] MD on 11/17/2009   Method used:   [facility]  [address] [id number]       [address]       [place], [place]  [address]       Ph: [phone] or [phone]       Fax: [phone]   RxID:   [id number]   Appended Document: SORE THROAT/ COUGH/NO FEVER / NWS    Clinical Lists Changes  Orders: Added new Service order of TD Toxoids IM 7 YR + (42706) - Signed Added new Service order of Admin 1st Vaccine (23762) - Signed Added new Service order of Admin of Therapeutic Inj  intramuscular or subcutaneous (83151) - Signed Added new Service order of Depo- Medrol 40mg  (J1030) - Signed Added new Service order of Depo- Medrol 80mg  (J1040) - Signed Observations: Added new observation of TD BOOST VIS: 05/08/07 version given Nov 17, 2009. (11/17/2009 13:09) Added new observation of TD BOOSTERLO: [id number] (11/17/2009 13:09) Added new observation of TD BOOST EXP: 07/03/2011 (11/17/2009 13:09) Added new observation of TD BOOSTERBY: Lakisha Archie CMA (11/17/2009 13:09) Added new observation of TD BOOSTERRT: IM (11/17/2009 13:09) Added new observation of TDBOOSTERDSE: 0.5 ml (11/17/2009 13:09) Added new observation of TD BOOSTERMF: Sanofi Pasteur (11/17/2009 13:09) Added new observation of TD BOOST SIT: left deltoid (11/17/2009 13:09)  Added new observation of TD BOOSTER: Td (11/17/2009 13:09)       Immunizations Administered:  Tetanus Vaccine:    Vaccine Type: Td    Site: left deltoid    Mfr: Sanofi Pasteur    Dose: 0.5 ml    Route: IM    Given by: Rock Nephew CMA    Exp. Date: 07/03/2011    Lot #: [id number]    VIS given: 05/08/07 version  given Nov 17, 2009.    Medication Administration  Injection # 1:    Medication: Depo- Medrol 80mg     Diagnosis: BRONCHITIS-ACUTE (ICD-466.0)    Route: IM    Site: R deltoid    Exp Date: 09/2012    Lot #: [id number]    Mfr: pfizer    Patient tolerated injection without complications    Given by: Rock Nephew CMA (Nov 17, 2009 1:10 PM)  Injection # 2:    Medication: Depo- Medrol 40mg     Diagnosis: BRONCHITIS-ACUTE (ICD-466.0)    Route: IM    Site: R deltoid    Exp Date: 09/2012    Lot #: [id number]    Mfr: pfizer    Patient tolerated injection without complications    Given by: Rock Nephew CMA (Nov 17, 2009 1:10 PM)  Orders Added: 1)  TD Toxoids IM 7 YR + [90714] 2)  Admin 1st Vaccine [90471] 3)  Admin of Therapeutic Inj  intramuscular or subcutaneous [96372] 4)  Depo- Medrol 40mg  [J1030] 5)  Depo- Medrol 80mg  [J1040]

## 2010-07-22 NOTE — Letter (Signed)
Summary: [facility]   Imported By: Sherian Rein 11/27/2009 13:39:47  _____________________________________________________________________  External Attachment:    Type:   Image     Comment:   External Document

## 2010-07-22 NOTE — Letter (Signed)
Summary: Results Follow-up Letter  [facility]  [address] [place], [place] [address]   Phone: [phone]  Fax: [phone]    02/11/2010  [address] [place], [place]  [address]  Dear Ms. Dovel,   The following are the results of your recent test(s):  Test     Result     CBC       normal Liver       one slightly elevated liver enzyme Kidney     normal Thyroid     normal Blood sugars   normal Urine       normal   _________________________________________________________  Please call for an appointment as directed _________________________________________________________ _________________________________________________________ _________________________________________________________  Sincerely,  Sanda Linger MD [facility]

## 2010-07-22 NOTE — Letter (Signed)
[facility]   Imported By: Sherian Rein 01/11/2010 12:13:04  _____________________________________________________________________  External Attachment:    Type:   Image     Comment:   External Document

## 2010-07-22 NOTE — Letter (Signed)
Summary: Medical Clearance / [facility]  Medical Clearance / [facility]   Imported By: Lennie Odor 02/11/2010 15:40:53  _____________________________________________________________________  External Attachment:    Type:   Image     Comment:   External Document

## 2010-07-23 ENCOUNTER — Encounter: Payer: Self-pay | Admitting: Internal Medicine

## 2010-07-23 ENCOUNTER — Ambulatory Visit (INDEPENDENT_AMBULATORY_CARE_PROVIDER_SITE_OTHER): Payer: Medicare Other | Admitting: Internal Medicine

## 2010-07-23 ENCOUNTER — Ambulatory Visit (INDEPENDENT_AMBULATORY_CARE_PROVIDER_SITE_OTHER)
Admission: RE | Admit: 2010-07-23 | Discharge: 2010-07-23 | Disposition: A | Discharge status: Home / Self Care | Payer: Medicare Other | Source: Ambulatory Visit | Attending: Internal Medicine | Admitting: Internal Medicine

## 2010-07-23 DIAGNOSIS — N390 Urinary tract infection, site not specified: Secondary | ICD-10-CM

## 2010-07-23 DIAGNOSIS — R1084 Generalized abdominal pain: Secondary | ICD-10-CM

## 2010-07-23 DIAGNOSIS — E039 Hypothyroidism, unspecified: Secondary | ICD-10-CM

## 2010-07-23 DIAGNOSIS — R109 Unspecified abdominal pain: Secondary | ICD-10-CM

## 2010-07-23 MED ORDER — IOHEXOL 300 MG/ML  SOLN
100.0000 mL | Freq: Once | INTRAMUSCULAR | Status: AC | PRN
  Administered 2010-07-23: 100 mL via INTRAVENOUS

## 2010-07-28 NOTE — Progress Notes (Signed)
Summary: EVAL w/PLOT TODAY  Phone Note Call from Patient   Summary of Call: Pt c/o left sided pain, increased over last 2 days. No CP or sob - has some increase in pain w/inspiration. Scheduled for eval today and instructed to call office back or go to ER w/any severe symptoms or change in symptoms.  Initial call taken by: Lamar Sprinkles, CMA,  July 21, 2010 10:36 AM

## 2010-07-28 NOTE — Assessment & Plan Note (Signed)
Summary: left side pain/SD   Vital Signs:  Patient profile:   77 year old female Menstrual status:  hysterectomy Height:      67 inches Weight:      179 pounds BMI:     28.14 O2 Sat:      97 % on Room air Temp:     98.5 degrees F oral Pulse rate:   68 / minute Pulse rhythm:   regular Resp:     16 per minute BP sitting:   120 / 68  (left arm) Cuff size:   regular  Vitals Entered By: Lanier Prude, CMA(AAMA) (July 21, 2010 2:07 PM)  O2 Flow:  Room air CC: Lt side abd pain X 2 days Is Patient Diabetic? No Comments pt is not taking Calcium/vit d, Tessalon pearles, Prednisone or Zpak   Primary Care Provider:  [name] MD  CC:  Lt side abd pain X 2 days.  History of Present Illness: C/o L abd pain x 2 d - worse today, nausea. The pain was severe, not better w/OTC meds.  She had a soft BM yesterday; she took Pepto. No chills  Current Medications (verified): 1)  Alprazolam 0.5 Mg  Tabs (Alprazolam) .... Two Tablet As Needed 2)  Calcium 600/vitamin D 600-400 Mg-Unit  Tabs (Calcium Carbonate-Vitamin D) .... Take 1 Tablet By Mouth Once A Day 3)  Centrum Silver   Tabs (Multiple Vitamins-Minerals) .... Take 1 Tablet By Mouth Once A Day 4)  Levoxyl 100 Mcg  Tabs (Levothyroxine Sodium) .... Take 1 Tablet By Mouth Once A Day 5)  Oxygen .... 2l At Bedtime and As Needed 6)  Xopenex 1.25 Mg/9ml  Nebu (Levalbuterol Hcl) .... Inhale 1 Vial Via Hhn Three Times A Day and As Needed 7)  Aspirin Adult Low Strength 81 Mg Tbec (Aspirin) .... Take 1 By Mouth Once Daily 8)  Lovastatin 40 Mg Tabs (Lovastatin) .... Once Daily -Hold Med 05/26/10 9)  Qvar 80 Mcg/act Aers (Beclomethasone Dipropionate) .... Inhale 2 Puffs Two Times A Day and Rinse Mouth Well After Use 10)  Tessalon Perles 100 Mg Caps (Benzonatate) .[name].. 1-2 Four Times A Day As Needed Cough 11)  Prednisone 10 Mg Tabs (Prednisone) .[name].. 1 Daily X 7 Days 12)  Zithromax Z-Pak 250 Mg Tabs (Azithromycin) .... 2 Today Then One  Daily  Allergies (verified): 1)  ! Penicillin 2)  ! Sulfa 3)  ! Codeine 4)  ! Nitrofurantoin Monohyd Macro (Nitrofurantoin Monohyd Macro) 5)  ! Ampicillin  Past History:  Past Medical History: Last updated: 10/01/2009 Allergic Rhinitis COPD Hx bronchogenic carcinoma  resected RUL RUL nodule Pelvic cyst Bladder atony  Social History: Last updated: 10/29/2008 Pt is married with children. Pt is retired from school system, [employer]. Quit smoking 2001.  Smoked for 42 yrs x 1ppd. Alcohol use-no Drug use-no Regular exercise-no  Past Surgical History: Right upper lobectomy 2005- Dr. Edwyna Shell hysterectomy - complete spine surgery Needle aspiration pelvic cyst- Dr Retta Diones 2011 cataract surgery.  Review of Systems       The patient complains of dyspnea on exertion.  The patient denies chest pain, syncope, abdominal pain, melena, hematochezia, severe indigestion/heartburn, hematuria, and incontinence.    Physical Exam  General:  alert, well-developed, well-hydrated, appropriate dress, healthy-appearing, cooperative to examination, and good hygiene.  overweight-appearing.  Dyspneic, talkative. Eyes:  vision grossly intact, pupils equal, and pupils round.   Mouth:  good dentition, no gingival abnormalities, no dental plaque, and pharynx pink and moist.  Neck:  supple, full ROM, no masses, no thyromegaly, no JVD, normal carotid upstroke, no carotid bruits, and no cervical lymphadenopathy.   Lungs:  normal respiratory effort, no intercostal retractions, no accessory muscle use, and normal breath sounds.   Heart:  normal rate, regular rhythm, no murmur, no gallop, and no rub.   Abdomen:  soft,tender in LLQ and LUQ , normal bowel sounds, no distention, no masses, no guarding, no rigidity, no rebound tenderness, no hepatomegaly, and no splenomegaly.   Msk:  normal ROM, no joint tenderness, no joint swelling, no joint warmth, no redness over joints, no joint deformities, no joint  instability, and no crepitation.   Extremities:  No clubbing, cyanosis, edema, or deformity noted with normal full range of motion of all joints.   Skin:  turgor normal, color normal, no rashes, no suspicious lesions, no ecchymoses, no petechiae, no purpura, no ulcerations, and no edema.   Psych:  Oriented X3.     Impression & Recommendations:  Problem # 1:  ABDOMINAL PAIN, GENERALIZED (ICD-789.07) L sided Assessment New  Poss diverticulitis vs UTI vs renal colic vs other  Orders: TLB-BMP (Basic Metabolic Panel-BMET) (80048-METABOL) TLB-CBC Platelet - w/Differential (85025-CBCD) TLB-Hepatic/Liver Function Pnl (80076-HEPATIC) TLB-Lipase (83690-LIPASE) TLB-Sedimentation Rate (ESR) (85652-ESR) TLB-Udip ONLY (81003-UDIP) Radiology Referral (Radiology) Empiric antibiotic   Problem # 2:  NAUSEA (ICD-787.02) Assessment: New Promethazine prn Orders: TLB-BMP (Basic Metabolic Panel-BMET) (80048-METABOL) TLB-CBC Platelet - w/Differential (85025-CBCD) TLB-Hepatic/Liver Function Pnl (80076-HEPATIC) TLB-Lipase (83690-LIPASE) TLB-Sedimentation Rate (ESR) (85652-ESR) TLB-Udip ONLY (81003-UDIP) Radiology Referral (Radiology)  Complete Medication List: 1)  Alprazolam 0.5 Mg Tabs (Alprazolam) .... Two tablet as needed 2)  Calcium 600/vitamin D 600-400 Mg-unit Tabs (Calcium carbonate-vitamin d) .... Take 1 tablet by mouth once a day 3)  Centrum Silver Tabs (Multiple vitamins-minerals) .... Take 1 tablet by mouth once a day 4)  Levoxyl 100 Mcg Tabs (Levothyroxine sodium) .... Take 1 tablet by mouth once a day 5)  Oxygen  .... 2l at bedtime and as needed 6)  Xopenex 1.25 Mg/55ml Nebu (Levalbuterol hcl) .... Inhale 1 vial via hhn three times a day and as needed 7)  Aspirin Adult Low Strength 81 Mg Tbec (Aspirin) .... Take 1 by mouth once daily 8)  Lovastatin 40 Mg Tabs (Lovastatin) .... Once daily -hold med 05/26/10 9)  Qvar 80 Mcg/act Aers (Beclomethasone dipropionate) .... Inhale 2 puffs two  times a day and rinse mouth well after use 10)  Tessalon Perles 100 Mg Caps (Benzonatate) .[name].. 1-2 four times a day as needed cough 11)  Prednisone 10 Mg Tabs (Prednisone) .[name].. 1 daily x 7 days 12)  Zithromax Z-pak 250 Mg Tabs (Azithromycin) .... 2 today then one daily 13)  Ciprofloxacin Hcl 500 Mg Tabs (Ciprofloxacin hcl) .[name].. 1 by mouth bid 14)  Promethazine Hcl 25 Mg Tabs (Promethazine hcl) .[name].. 1-2 by mouth four times a day as needed nausea 15)  Hydrocodone-acetaminophen 5-325 Mg Tabs (Hydrocodone-acetaminophen) .[name].. 1-2 by mouth two times a day as needed pain  Patient Instructions: 1)  Please schedule a follow-up appointment on Fri - Dr Yetta Barre 2)  Call if you are not better in a reasonable amount of time or if worse. Go to ER if feeling really bad!  3)  Low residue diet x 10 days Prescriptions: HYDROCODONE-ACETAMINOPHEN 5-325 MG TABS (HYDROCODONE-ACETAMINOPHEN) 1-2 by mouth two times a day as needed pain  #20 x 0   Entered and Authorized by:   Tresa Garter MD   Signed by:   Georgina Quint Plotnikov  MD on 07/21/2010   Method used:   Print then Give to Patient   RxID:   [id number] PROMETHAZINE HCL 25 MG TABS (PROMETHAZINE HCL) 1-2 by mouth four times a day as needed nausea  #60 x 1   Entered and Authorized by:   Tresa Garter MD   Signed by:   Tresa Garter MD on 07/21/2010   Method used:   Electronically to        [facility]  [id number] (retail)       [address]       [place], [place]  [address]       Ph: [phone] or [phone]       Fax: [phone]   RxID:   [id number] CIPROFLOXACIN HCL 500 MG TABS (CIPROFLOXACIN HCL) 1 by mouth bid  #20 x 1   Entered and Authorized by:   Tresa Garter MD   Signed by:   Tresa Garter MD on 07/21/2010   Method used:   Electronically to        [facility]  [address] [id number] (retail)       [address]       [place], [place]  [address]       Ph: [phone] or  [phone]       Fax: [phone]   RxID:   [id number]    Orders Added: 1)  TLB-BMP (Basic Metabolic Panel-BMET) [80048-METABOL] 2)  TLB-CBC Platelet - w/Differential [85025-CBCD] 3)  TLB-Hepatic/Liver Function Pnl [80076-HEPATIC] 4)  TLB-Lipase [83690-LIPASE] 5)  TLB-Sedimentation Rate (ESR) [85652-ESR] 6)  TLB-Udip ONLY [81003-UDIP] 7)  Radiology Referral [Radiology] 8)  Est. Patient Level IV [61607]

## 2010-07-28 NOTE — Assessment & Plan Note (Signed)
Summary: FRI  FU---STC   Vital Signs:  Patient profile:   77 year old female Menstrual status:  hysterectomy Height:      67 inches Weight:      179 pounds O2 Sat:      98 % on Room air Temp:     97.5 degrees F oral Pulse rate:   90 / minute Resp:     16 per minute BP sitting:   112 / 68  (left arm) Cuff size:   regular  Vitals Entered By: Rock Nephew CMA (July 23, 2010 2:15 PM)  O2 Flow:  Room air CC: follow-up visit Is Patient Diabetic? No Pain Assessment Patient in pain? no       Does patient need assistance? Functional Status Self care Ambulation Normal   Primary Care Provider:  [name] MD  CC:  follow-up visit.  History of Present Illness: She returns for f/up and she tells me that her left flank pain is better. I reviewed her labs and CT scan and the only significant finding is WBC's and leukoctye esterase in the urine. She has mild dysuria.  Preventive Screening-Counseling & Management  Alcohol-Tobacco     Alcohol drinks/day: 0     Alcohol Counseling: not indicated; patient does not drink     Smoking Status: quit     Year Quit: 2003     Passive Smoke Exposure: no     Tobacco Counseling: to remain off tobacco products  Hep-HIV-STD-Contraception     Hepatitis Risk: no risk noted     HIV Risk: no risk noted     STD Risk: no risk noted      Drug Use:  no.    Clinical Review Panels:  Prevention   Last Mammogram:  BI-RADS CATEGORY 3:  Probably benign finding(s) - short interval^MM DIGITAL DIAGNOSTIC BILAT (03/31/2010)  Immunizations   Last Tetanus Booster:  Td (11/17/2009)   Last Flu Vaccine:  Fluvax 3+ (03/16/2010)   Last H1N1 Vaccine 1:  Given (05/28/2008)   Last Pneumovax:  Pneumovax (02/21/2006)  Lipid Management   Cholesterol:  206 (02/10/2010)   LDL (bad choesterol):  71 (10/30/2009)   HDL (good cholesterol):  80.10 (02/10/2010)  Diabetes Management   HgBA1C:  5.3 (05/06/2010)   Creatinine:  1.1 (07/21/2010)   Last Flu  Vaccine:  Fluvax 3+ (03/16/2010)   Last Pneumovax:  Pneumovax (02/21/2006)  CBC   WBC:  8.9 (07/21/2010)   RBC:  4.12 (07/21/2010)   Hgb:  14.3 (07/21/2010)   Hct:  41.5 (07/21/2010)   Platelets:  233.0 (07/21/2010)   MCV  100.7 (07/21/2010)   MCHC  34.3 (07/21/2010)   RDW  15.1 (07/21/2010)   PMN:  64.0 (07/21/2010)   Lymphs:  27.1 (07/21/2010)   Monos:  8.1 (07/21/2010)   Eosinophils:  0.2 (07/21/2010)   Basophil:  0.6 (07/21/2010)  Complete Metabolic Panel   Glucose:  104 (07/21/2010)   Sodium:  137 (07/21/2010)   Potassium:  4.8 (07/21/2010)   Chloride:  96 (07/21/2010)   CO2:  33 (07/21/2010)   BUN:  10 (07/21/2010)   Creatinine:  1.1 (07/21/2010)   Albumin:  3.8 (07/21/2010)   Total Protein:  7.5 (07/21/2010)   Calcium:  9.9 (07/21/2010)   Total Bili:  0.9 (07/21/2010)   Alk Phos:  86 (07/21/2010)   SGPT (ALT):  29 (07/21/2010)   SGOT (AST):  45 (07/21/2010)   Medications Prior to Update: 1)  Alprazolam 0.5 Mg  Tabs (Alprazolam) .... Two  Tablet As Needed 2)  Calcium 600/vitamin D 600-400 Mg-Unit  Tabs (Calcium Carbonate-Vitamin D) .... Take 1 Tablet By Mouth Once A Day 3)  Centrum Silver   Tabs (Multiple Vitamins-Minerals) .... Take 1 Tablet By Mouth Once A Day 4)  Levoxyl 100 Mcg  Tabs (Levothyroxine Sodium) .... Take 1 Tablet By Mouth Once A Day 5)  Oxygen .... 2l At Bedtime and As Needed 6)  Xopenex 1.25 Mg/57ml  Nebu (Levalbuterol Hcl) .... Inhale 1 Vial Via Hhn Three Times A Day and As Needed 7)  Aspirin Adult Low Strength 81 Mg Tbec (Aspirin) .... Take 1 By Mouth Once Daily 8)  Lovastatin 40 Mg Tabs (Lovastatin) .... Once Daily -Hold Med 05/26/10 9)  Qvar 80 Mcg/act Aers (Beclomethasone Dipropionate) .... Inhale 2 Puffs Two Times A Day and Rinse Mouth Well After Use 10)  Tessalon Perles 100 Mg Caps (Benzonatate) .[name].. 1-2 Four Times A Day As Needed Cough 11)  Prednisone 10 Mg Tabs (Prednisone) .[name].. 1 Daily X 7 Days 12)  Zithromax Z-Pak 250 Mg Tabs  (Azithromycin) .... 2 Today Then One Daily 13)  Ciprofloxacin Hcl 500 Mg Tabs (Ciprofloxacin Hcl) .[name].. 1 By Mouth Bid 14)  Promethazine Hcl 25 Mg Tabs (Promethazine Hcl) .[name].. 1-2 By Mouth Four Times A Day As Needed Nausea 15)  Hydrocodone-Acetaminophen 5-325 Mg Tabs (Hydrocodone-Acetaminophen) .[name].. 1-2 By Mouth Two Times A Day As Needed Pain  Current Medications (verified): 1)  Alprazolam 0.5 Mg  Tabs (Alprazolam) .... Two Tablet As Needed 2)  Calcium 600/vitamin D 600-400 Mg-Unit  Tabs (Calcium Carbonate-Vitamin D) .... Take 1 Tablet By Mouth Once A Day 3)  Centrum Silver   Tabs (Multiple Vitamins-Minerals) .... Take 1 Tablet By Mouth Once A Day 4)  Levoxyl 100 Mcg  Tabs (Levothyroxine Sodium) .... Take 1 Tablet By Mouth Once A Day 5)  Oxygen .... 2l At Bedtime and As Needed 6)  Xopenex 1.25 Mg/92ml  Nebu (Levalbuterol Hcl) .... Inhale 1 Vial Via Hhn Three Times A Day and As Needed 7)  Aspirin Adult Low Strength 81 Mg Tbec (Aspirin) .... Take 1 By Mouth Once Daily 8)  Qvar 80 Mcg/act Aers (Beclomethasone Dipropionate) .... Inhale 2 Puffs Two Times A Day and Rinse Mouth Well After Use 9)  Tessalon Perles 100 Mg Caps (Benzonatate) .[name].. 1-2 Four Times A Day As Needed Cough 10)  Ciprofloxacin Hcl 500 Mg Tabs (Ciprofloxacin Hcl) .[name].. 1 By Mouth Bid 11)  Promethazine Hcl 25 Mg Tabs (Promethazine Hcl) .[name].. 1-2 By Mouth Four Times A Day As Needed Nausea 12)  Hydrocodone-Acetaminophen 5-325 Mg Tabs (Hydrocodone-Acetaminophen) .[name].. 1-2 By Mouth Two Times A Day As Needed Pain  Allergies: 1)  ! Penicillin 2)  ! Sulfa 3)  ! Codeine 4)  ! Nitrofurantoin Monohyd Macro (Nitrofurantoin Monohyd Macro) 5)  ! Ampicillin 6)  ! Zithromax  Past History:  Past Medical History: Last updated: 10/01/2009 Allergic Rhinitis COPD Hx bronchogenic carcinoma  resected RUL RUL nodule Pelvic cyst Bladder atony  Past Surgical History: Last updated: 07/21/2010 Right upper lobectomy 2005- Dr. Edwyna Shell hysterectomy  - complete spine surgery Needle aspiration pelvic cyst- Dr Retta Diones 2011 cataract surgery.  Family History: Last updated: 10/10/2007 father-heart disease brother and sister-rheumatism brother died- leukemiaa written for her at a [employer] is a  Social History: Last updated: 10/29/2008 Pt is married with children. Pt is retired from school system, [employer]. Quit smoking 2001.  Smoked for 42 yrs x 1ppd. Alcohol use-no Drug use-no Regular exercise-no  Risk Factors: Alcohol  Use: 0 (07/23/2010) Exercise: no (10/29/2008)  Risk Factors: Smoking Status: quit (07/23/2010) Passive Smoke Exposure: no (07/23/2010)  Family History: Reviewed history from 10/10/2007 and no changes required. father-heart disease brother and sister-rheumatism brother died- leukemiaa written for her at a [employer] is a  Social History: Reviewed history from 10/29/2008 and no changes required. Pt is married with children. Pt is retired from school system, [employer]. Quit smoking 2001.  Smoked for 42 yrs x 1ppd. Alcohol use-no Drug use-no Regular exercise-no  Review of Systems  The patient denies anorexia, fever, weight loss, weight gain, chest pain, syncope, dyspnea on exertion, peripheral edema, prolonged cough, headaches, hemoptysis, abdominal pain, hematuria, incontinence, suspicious skin lesions, and enlarged lymph nodes.   GI:  Complains of abdominal pain; denies bloody stools, change in bowel habits, constipation, diarrhea, indigestion, loss of appetite, nausea, vomiting, vomiting blood, and yellowish skin color. GU:  Complains of dysuria; denies abnormal vaginal bleeding, decreased libido, discharge, hematuria, incontinence, nocturia, urinary frequency, and urinary hesitancy.  Physical Exam  General:  alert, well-developed, well-nourished, and well-hydrated.   Head:  normocephalic, atraumatic, no abnormalities observed, and no abnormalities palpated.   Eyes:  vision grossly intact, pupils  equal, and no injection or icterus. Mouth:  Oral mucosa and oropharynx without lesions or exudates.  Teeth in good repair. Neck:  supple, full ROM, no masses, no thyromegaly, no JVD, normal carotid upstroke, no carotid bruits, and no cervical lymphadenopathy.   Lungs:  normal respiratory effort, no intercostal retractions, no accessory muscle use, and normal breath sounds.   Heart:  normal rate, regular rhythm, no murmur, no gallop, and no rub.   Abdomen:  soft, non-tender, normal bowel sounds, no distention, no masses, no guarding, no rigidity, no rebound tenderness, no abdominal hernia, no inguinal hernia, no hepatomegaly, and no splenomegaly.   Msk:  normal ROM, no joint tenderness, no joint swelling, no joint warmth, no redness over joints, no joint deformities, no joint instability, and no crepitation.   Pulses:  R and L carotid,radial,femoral,dorsalis pedis and posterior tibial pulses are full and equal bilaterally Extremities:  No clubbing, cyanosis, edema, or deformity noted with normal full range of motion of all joints.   Neurologic:  No cranial nerve deficits noted. Station and gait are normal. Plantar reflexes are down-going bilaterally. DTRs are symmetrical throughout. Sensory, motor and coordinative functions appear intact. Skin:  turgor normal, color normal, no rashes, no suspicious lesions, no ecchymoses, no petechiae, no purpura, no ulcerations, and no edema.   Cervical Nodes:  no anterior cervical adenopathy and no posterior cervical adenopathy.   Axillary Nodes:  no R axillary adenopathy and no L axillary adenopathy.   Inguinal Nodes:  no R inguinal adenopathy and no L inguinal adenopathy.   Psych:  Oriented X3, memory intact for recent and remote, normally interactive, good eye contact, not anxious appearing, not depressed appearing, not agitated, and not suicidal.     Impression & Recommendations:  Problem # 1:  ABDOMINAL PAIN, GENERALIZED (ICD-789.07) Assessment  Improved  Problem # 2:  UTI (ICD-599.0) Assessment: Improved  The following medications were removed from the medication list:    Zithromax Z-pak 250 Mg Tabs (Azithromycin) .[name]... 2 today then one daily Her updated medication list for this problem includes:    Ciprofloxacin Hcl 500 Mg Tabs (Ciprofloxacin hcl) .[name]... 1 by mouth bid  Encouraged to push clear liquids, get enough rest, and take acetaminophen as needed. To be seen in 10 days if no improvement, sooner if worse.  Problem #  3:  HYPOTHYROIDISM (ICD-244.9) Assessment: Unchanged  Her updated medication list for this problem includes:    Levoxyl 100 Mcg Tabs (Levothyroxine sodium) .[name]... Take 1 tablet by mouth once a day  Labs Reviewed: TSH: 2.65 (05/06/2010)    HgBA1c: 5.3 (05/06/2010) Chol: 206 (02/10/2010)   HDL: 80.10 (02/10/2010)   LDL: 71 (10/30/2009)   TG: 114.0 (02/10/2010)  Complete Medication List: 1)  Alprazolam 0.5 Mg Tabs (Alprazolam) .... Two tablet as needed 2)  Calcium 600/vitamin D 600-400 Mg-unit Tabs (Calcium carbonate-vitamin d) .... Take 1 tablet by mouth once a day 3)  Centrum Silver Tabs (Multiple vitamins-minerals) .... Take 1 tablet by mouth once a day 4)  Levoxyl 100 Mcg Tabs (Levothyroxine sodium) .... Take 1 tablet by mouth once a day 5)  Oxygen  .... 2l at bedtime and as needed 6)  Xopenex 1.25 Mg/34ml Nebu (Levalbuterol hcl) .... Inhale 1 vial via hhn three times a day and as needed 7)  Aspirin Adult Low Strength 81 Mg Tbec (Aspirin) .... Take 1 by mouth once daily 8)  Qvar 80 Mcg/act Aers (Beclomethasone dipropionate) .... Inhale 2 puffs two times a day and rinse mouth well after use 9)  Tessalon Perles 100 Mg Caps (Benzonatate) .[name].. 1-2 four times a day as needed cough 10)  Ciprofloxacin Hcl 500 Mg Tabs (Ciprofloxacin hcl) .[name].. 1 by mouth bid 11)  Promethazine Hcl 25 Mg Tabs (Promethazine hcl) .[name].. 1-2 by mouth four times a day as needed nausea 12)  Hydrocodone-acetaminophen 5-325 Mg Tabs  (Hydrocodone-acetaminophen) .[name].. 1-2 by mouth two times a day as needed pain  Patient Instructions: 1)  Please schedule a follow-up appointment in 1 month. 2)  Drink as much fluid as you can tolerate for the next few days. 3)  It is important that you exercise regularly at least 20 minutes 5 times a week. If you develop chest pain, have severe difficulty breathing, or feel very tired , stop exercising immediately and seek medical attention. 4)  You need to lose weight. Consider a lower calorie diet and regular exercise.  5)  Take your antibiotic as prescribed until ALL of it is gone, but stop if you develop a rash or swelling and contact our office as soon as possible.   Orders Added: 1)  Est. Patient Level III [64332]

## 2010-08-05 ENCOUNTER — Encounter: Payer: Self-pay | Admitting: Internal Medicine

## 2010-08-05 ENCOUNTER — Ambulatory Visit (INDEPENDENT_AMBULATORY_CARE_PROVIDER_SITE_OTHER): Payer: Medicare Other | Admitting: Internal Medicine

## 2010-08-05 DIAGNOSIS — J449 Chronic obstructive pulmonary disease, unspecified: Secondary | ICD-10-CM

## 2010-08-05 DIAGNOSIS — C3410 Malignant neoplasm of upper lobe, unspecified bronchus or lung: Secondary | ICD-10-CM

## 2010-08-05 DIAGNOSIS — J309 Allergic rhinitis, unspecified: Secondary | ICD-10-CM

## 2010-08-17 NOTE — Assessment & Plan Note (Signed)
Summary: rov 3 months//kp   Copy to:  [facility] Primary Provider/Referring Provider:  [name] MD  CC:  Follow up visit-asthma and allergies; doing okay overall.[name]  History of Present Illness: March 16, 2010-   Abnl CXR, Hx RUL Ca, COPD, Allergic rhinitis, RUL nodule/ PET                   .[name]..............Michelle Kitchenhusand here Increased cough and more short of breath, needing to use her oxygen more, over past week. Blames Qvar for easy bruising. Coughed as she got into bed 3 nights ago. Coughed till she retched up her supper. This morning coughed and refluxed her coffee. Morning chest congestion. Using Xopenex neb once daily.; Stopped Advair- made her mouth sore. Pain to right of lower sternum x 4-5 days.   May 06, 2010- Abnl CXR, Hx RUL Ca, COPD, Allergic rhinitis, RUL nodule/ PET  ....husband here Nurse-CC: 6 month follow up visit-COPD; recent stomach virus-last about 3-4 days; breathing about the same-I noticed some labored breathing when pt entered room today. Breathing stable, though not as good im current rain. Had incidental gastroenteritis- lasted a week.  CXR-  Stable COPD, w/ right pleural thickening and some fibrosis, not changed. Got flu shot.  Daily morning cough- clear phlegm. Uses neb 1-2x/ day. Avoids Spiriva due to hx bladder atony. Uses O2 at night and as needed- needing it more lately.   August 05, 2010- Abnl CXR, Hx RUL Ca, COPD, Allergic rhinitis, RUL nodule/ PET ....daughter here Nurse-CC: Follow up visit-asthma and allergies; doing okay overall. CXR- 03/16/10- IMPRESSION: Hyperinflation consistent with obstructive pulmonary disease. Chronic volume loss, pulmonary fibrosis, and pleural thickening in the inferior right hemithorax are stable.  No new lesion is evident.  Read By:  Call, Onalee Hua,  M.D.     .She feels better than last ime with me. Will sit and wear home O2 when needed. Didn't need rescue inhaler more than a few times. Albuterol makes her nervous,  but does ok using her Xopenex neb once or twice. Discussed trying xopenex HFA. Daily morning cough with phlegm- mostly clear. Can't walk far w/o oxygen. Denies chest pain or palpitation.     Preventive Screening-Counseling & Management  Alcohol-Tobacco     Alcohol drinks/day: 0     Alcohol Counseling: not indicated; patient does not drink     Smoking Status: quit     Year Quit: 2003     Passive Smoke Exposure: no     Tobacco Counseling: to remain off tobacco products  Current Medications (verified): 1)  Alprazolam 0.5 Mg  Tabs (Alprazolam) .... Two Tablet As Needed 2)  Centrum Silver   Tabs (Multiple Vitamins-Minerals) .... Take 1 Tablet By Mouth Once A Day 3)  Levoxyl 100 Mcg  Tabs (Levothyroxine Sodium) .... Take 1 Tablet By Mouth Once A Day 4)  Oxygen .... 2l At Bedtime and As Needed 5)  Xopenex 1.25 Mg/68ml  Nebu (Levalbuterol Hcl) .... Inhale 1 Vial Via Hhn Three Times A Day and As Needed 6)  Aspirin Adult Low Strength 81 Mg Tbec (Aspirin) .... Take 1 By Mouth Once Daily 7)  Qvar 80 Mcg/act Aers (Beclomethasone Dipropionate) .... Inhale 2 Puffs Two Times A Day and Rinse Mouth Well After Use 8)  Tessalon Perles 100 Mg Caps (Benzonatate) .[name].. 1-2 Four Times A Day As Needed Cough 9)  Promethazine Hcl 25 Mg Tabs (Promethazine Hcl) .[name].. 1-2 By Mouth Four Times A Day As Needed Nausea 10)  Hydrocodone-Acetaminophen 5-325 Mg Tabs (  Hydrocodone-Acetaminophen) .[name].. 1-2 By Mouth Two Times A Day As Needed Pain  Allergies (verified): 1)  ! Penicillin 2)  ! Sulfa 3)  ! Codeine 4)  ! Nitrofurantoin Monohyd Macro (Nitrofurantoin Monohyd Macro) 5)  ! Ampicillin 6)  ! Zithromax  Past History:  Past Medical History: Last updated: 10/01/2009 Allergic Rhinitis COPD Hx bronchogenic carcinoma  resected RUL RUL nodule Pelvic cyst Bladder atony  Past Surgical History: Last updated: 07/21/2010 Right upper lobectomy 2005- Dr. Edwyna Shell hysterectomy - complete spine surgery Needle aspiration  pelvic cyst- Dr Retta Diones 2011 cataract surgery.  Family History: Last updated: 10/10/2007 father-heart disease brother and sister-rheumatism brother died- leukemiaa written for her at a [employer] [facility] is a  Social History: Last updated: 10/29/2008 Pt is married with children. Pt is retired from school system, [employer]. Quit smoking 2001.  Smoked for 42 yrs x 1ppd. Alcohol use-no Drug use-no Regular exercise-no  Risk Factors: Alcohol Use: 0 (08/05/2010) Exercise: no (10/29/2008)  Risk Factors: Smoking Status: quit (08/05/2010) Passive Smoke Exposure: no (08/05/2010)  Review of Systems      See HPI       The patient complains of shortness of breath with activity and productive cough.  The patient denies shortness of breath at rest, non-productive cough, coughing up blood, chest pain, irregular heartbeats, acid heartburn, indigestion, loss of appetite, weight change, abdominal pain, difficulty swallowing, sore throat, tooth/dental problems, headaches, nasal congestion/difficulty breathing through nose, and sneezing.    Vital Signs:  Patient profile:   77 year old female Menstrual status:  hysterectomy Height:      67 inches Weight:      182.50 pounds BMI:     28.69 O2 Sat:      94 % on Room air Pulse rate:   86 / minute BP sitting:   136 / 72  (left arm) Cuff size:   regular  Vitals Entered By: Reynaldo Minium CMA (August 05, 2010 3:06 PM)  O2 Flow:  Room air CC: Follow up visit-asthma and allergies; doing okay overall.   Physical Exam  Additional Exam:  General: A/Ox3; pleasant and cooperative, NAD, on room air- resting sat 94% SKIN: no rash, lesions NODES: no lymphadenopathy HEENT: [place]/AT, EOM- WNL, Conjuctivae- clear, PERRLA, TM-WNL, Nose- clear, Throat- clear and wnl, talkative, Mallampati  II NECK: Supple w/ fair ROM, JVD- none, normal carotid impulses w/o bruits Thyroid- CHEST: wheeze, tender to presure lower sternum HEART: RRR, no m/g/r  heard ABDOMEN:soft ZOX:WRUE, nl pulses, no edema -trim ankles NEURO: Grossly intact to observation        Impression & Recommendations:  Problem # 1:  COPD (ICD-496) I am hearing wheeze today and it will be good for her to have a rescue inhaler.  She would like to talk about alternatives to her DME company.  She uses oxygen at night and as needed with exertion.   Problem # 2:  LUNG CANCER, UPPER LOBE (ICD-162.3) Hx scarring right lung after cancer treatment,  but not actively bothering her.   Medications Added to Medication List This Visit: 1)  Xopenex Hfa 45 Mcg/act Aero (Levalbuterol tartrate) .... 2 puffs, four times a day as needed rescue inhaler  Other Orders: Est. Patient Level III (45409)  Patient Instructions: 1)  Please schedule a follow-up appointment in 6 months. 2)  See [facility] about alternatives to your home oxygen company 3)  Script  sample Xopenex HFA inhaler- 2 puffs four times a day as needed rescue inhaler 4)  You can continue to use your  nebulizer machine as needed.  Prescriptions: XOPENEX HFA 45 MCG/ACT AERO (LEVALBUTEROL TARTRATE) 2 puffs, four times a day as needed rescue inhaler  #1 x prn   Entered and Authorized by:   Waymon Budge MD   Signed by:   Waymon Budge MD on 08/05/2010   Method used:   Print then Give to Patient   RxID:   [id number]

## 2010-08-25 ENCOUNTER — Encounter: Payer: Self-pay | Admitting: Internal Medicine

## 2010-08-25 ENCOUNTER — Other Ambulatory Visit: Payer: Self-pay | Admitting: Internal Medicine

## 2010-08-25 ENCOUNTER — Ambulatory Visit (INDEPENDENT_AMBULATORY_CARE_PROVIDER_SITE_OTHER): Payer: Medicare Other | Admitting: Internal Medicine

## 2010-08-25 ENCOUNTER — Other Ambulatory Visit: Payer: Medicare Other

## 2010-08-25 DIAGNOSIS — E039 Hypothyroidism, unspecified: Secondary | ICD-10-CM

## 2010-08-25 DIAGNOSIS — R7309 Other abnormal glucose: Secondary | ICD-10-CM

## 2010-08-25 DIAGNOSIS — R350 Frequency of micturition: Secondary | ICD-10-CM

## 2010-08-25 DIAGNOSIS — N390 Urinary tract infection, site not specified: Secondary | ICD-10-CM

## 2010-08-25 DIAGNOSIS — J45909 Unspecified asthma, uncomplicated: Secondary | ICD-10-CM

## 2010-08-25 DIAGNOSIS — E7800 Pure hypercholesterolemia, unspecified: Secondary | ICD-10-CM

## 2010-08-25 DIAGNOSIS — J449 Chronic obstructive pulmonary disease, unspecified: Secondary | ICD-10-CM

## 2010-08-25 DIAGNOSIS — E785 Hyperlipidemia, unspecified: Secondary | ICD-10-CM

## 2010-08-25 DIAGNOSIS — J209 Acute bronchitis, unspecified: Secondary | ICD-10-CM

## 2010-08-25 DIAGNOSIS — J438 Other emphysema: Secondary | ICD-10-CM

## 2010-08-25 LAB — BASIC METABOLIC PANEL
BUN: 8 mg/dL (ref 6–23)
CO2: 32 mEq/L (ref 19–32)
GFR: 67.4 mL/min (ref >60.00)
Glucose, Bld: 105 mg/dL — ABNORMAL HIGH (ref 70–99)
Potassium: 4.4 mEq/L (ref 3.5–5.1)

## 2010-08-25 LAB — CBC WITH DIFFERENTIAL/PLATELET
Basophils Absolute: 0 10*3/uL (ref 0.0–0.1)
Eosinophils Absolute: 0.1 10*3/uL (ref 0.0–0.7)
HCT: 42.2 % (ref 36.0–46.0)
Lymphs Abs: 2.3 10*3/uL (ref 0.7–4.0)
MCHC: 34.2 g/dL (ref 30.0–36.0)
Monocytes Absolute: 0.7 10*3/uL (ref 0.1–1.0)
Monocytes Relative: 9.7 % (ref 3.0–12.0)
Platelets: 223 10*3/uL (ref 150.0–400.0)
RDW: 14.4 % (ref 11.5–14.6)

## 2010-08-25 LAB — HEPATIC FUNCTION PANEL
AST: 66 U/L — ABNORMAL HIGH (ref 0–37)
Total Bilirubin: 0.8 mg/dL (ref 0.3–1.2)

## 2010-08-25 LAB — URINALYSIS, ROUTINE W REFLEX MICROSCOPIC
Nitrite: NEGATIVE
Specific Gravity, Urine: 1.015 (ref 1.000–1.030)
Urobilinogen, UA: 0.2 (ref 0.0–1.0)
pH: 6 (ref 5.0–8.0)

## 2010-08-25 LAB — LIPID PANEL
HDL: 73.6 mg/dL (ref >39.00)
Triglycerides: 180 mg/dL — ABNORMAL HIGH (ref 0.0–149.0)

## 2010-08-25 LAB — TSH: TSH: 2.09 u[IU]/mL (ref 0.35–5.50)

## 2010-08-31 NOTE — Assessment & Plan Note (Addendum)
Summary: FU / CD # NWS   Vital Signs:  Patient profile:   77 year old female Menstrual status:  hysterectomy Height:      67 inches Weight:      180 pounds BMI:     28.29 O2 Sat:      98 % on Room air Temp:     98.0 degrees F oral Pulse rate:   88 / minute Pulse rhythm:   regular Resp:     20 per minute BP sitting:   118 / 52  (left arm) Cuff size:   regular  Vitals Entered By: Lanier Prude, Beverly Gust) (August 25, 2010 10:03 AM)  Nutrition Counseling: Patient's BMI is greater than 25 and therefore counseled on weight management options.  O2 Flow:  Room air CC: f/u, URI symptoms, Lipid Management Is Patient Diabetic? No Pain Assessment Patient in pain? no      Comments pt is not taking Hydroco/APAP   Primary Care Provider:  [name] MD  CC:  f/u, URI symptoms, and Lipid Management.  History of Present Illness:  URI Symptoms      This is a 77 year old woman who presents with URI symptoms.  The symptoms began 2 days ago.  The severity is described as moderate.  The patient reports sore throat and productive cough, but denies nasal congestion, clear nasal discharge, purulent nasal discharge, dry cough, earache, and sick contacts.  Associated symptoms include dyspnea and wheezing.  The patient denies fever, stiff neck, rash, vomiting, diarrhea, use of an antipyretic, and response to antipyretic.  The patient denies headache, muscle aches, and severe fatigue.  The patient denies the following risk factors for Strep sinusitis: unilateral facial pain, unilateral nasal discharge, poor response to decongestant, double sickening, tooth pain, Strep exposure, tender adenopathy, and absence of cough.    Lipid Management History:      Positive NCEP/ATP III risk factors include female age 75 years old or older, diabetes, and hypertension.  Negative NCEP/ATP III risk factors include no history of early menopause without estrogen hormone replacement, HDL cholesterol greater than 60,  no family history for ischemic heart disease, non-tobacco-user status, no ASHD (atherosclerotic heart disease), no prior stroke/TIA, no peripheral vascular disease, and no history of aortic aneurysm.        The patient states that she knows about the "Therapeutic Lifestyle Change" diet.  Her compliance with the TLC diet is good.  The patient expresses understanding of adjunctive measures for cholesterol lowering.  Adjunctive measures started by the patient include fiber, ASA, limit alcohol consumpton, and weight reduction.  She expresses no side effects from her lipid-lowering medication.  The patient denies any symptoms to suggest myopathy or liver disease.     Current Medications (verified): 1)  Alprazolam 0.5 Mg  Tabs (Alprazolam) .... Two Tablet As Needed 2)  Centrum Silver   Tabs (Multiple Vitamins-Minerals) .... Take 1 Tablet By Mouth Once A Day 3)  Levoxyl 100 Mcg  Tabs (Levothyroxine Sodium) .... Take 1 Tablet By Mouth Once A Day 4)  Oxygen .... 2l At Bedtime and As Needed 5)  Xopenex 1.25 Mg/23ml  Nebu (Levalbuterol Hcl) .... Inhale 1 Vial Via Hhn Three Times A Day and As Needed 6)  Xopenex Hfa 45 Mcg/act Aero (Levalbuterol Tartrate) .... 2 Puffs, Four Times A Day As Needed Rescue Inhaler 7)  Aspirin Adult Low Strength 81 Mg Tbec (Aspirin) .... Take 1 By Mouth Once Daily 8)  Qvar 80 Mcg/act Aers (Beclomethasone Dipropionate) .Marland KitchenMarland KitchenMarland Kitchen  Inhale 2 Puffs Two Times A Day and Rinse Mouth Well After Use 9)  Tessalon Perles 100 Mg Caps (Benzonatate) .[name].. 1-2 Four Times A Day As Needed Cough 10)  Promethazine Hcl 25 Mg Tabs (Promethazine Hcl) .[name].. 1-2 By Mouth Four Times A Day As Needed Nausea 11)  Hydrocodone-Acetaminophen 5-325 Mg Tabs (Hydrocodone-Acetaminophen) .[name].. 1-2 By Mouth Two Times A Day As Needed Pain  Allergies (verified): 1)  ! Penicillin 2)  ! Sulfa 3)  ! Codeine 4)  ! Nitrofurantoin Monohyd Macro (Nitrofurantoin Monohyd Macro) 5)  ! Ampicillin 6)  ! Zithromax  Past History:  Past  Medical History: Last updated: 10/01/2009 Allergic Rhinitis COPD Hx bronchogenic carcinoma  resected RUL RUL nodule Pelvic cyst Bladder atony  Past Surgical History: Last updated: 07/21/2010 Right upper lobectomy 2005- Dr. Edwyna Shell hysterectomy - complete spine surgery Needle aspiration pelvic cyst- Dr Retta Diones 2011 cataract surgery.  Family History: Last updated: 10/10/2007 father-heart disease brother and sister-rheumatism brother died- leukemiaa written for her at a [employer] [facility] is a  Social History: Last updated: 10/29/2008 Pt is married with children. Pt is retired from school system, [employer]. Quit smoking 2001.  Smoked for 42 yrs x 1ppd. Alcohol use-no Drug use-no Regular exercise-no  Risk Factors: Alcohol Use: 0 (08/05/2010) Exercise: no (10/29/2008)  Risk Factors: Smoking Status: quit (08/05/2010) Passive Smoke Exposure: no (08/05/2010)  Family History: Reviewed history from 10/10/2007 and no changes required. father-heart disease brother and sister-rheumatism brother died- leukemiaa written for her at a [employer] is a  Social History: Reviewed history from 10/29/2008 and no changes required. Pt is married with children. Pt is retired from school system, [employer]. Quit smoking 2001.  Smoked for 42 yrs x 1ppd. Alcohol use-no Drug use-no Regular exercise-no  Review of Systems  The patient denies anorexia, fever, weight loss, weight gain, chest pain, syncope, dyspnea on exertion, peripheral edema, headaches, hemoptysis, abdominal pain, hematuria, suspicious skin lesions, transient blindness, difficulty walking, depression, enlarged lymph nodes, and angioedema.   GU:  Complains of dysuria and urinary frequency; denies abnormal vaginal bleeding, decreased libido, discharge, genital sores, hematuria, incontinence, nocturia, and urinary hesitancy. Endo:  Denies cold intolerance, excessive hunger, excessive thirst, excessive urination, heat intolerance,  polyuria, and weight change.  Physical Exam  General:  alert, well-developed, well-nourished, and well-hydrated.   Head:  normocephalic, atraumatic, no abnormalities observed, and no abnormalities palpated.   Eyes:  vision grossly intact, pupils equal, and no injection or icterus. Mouth:  Oral mucosa and oropharynx without lesions or exudates.  Teeth in good repair. Neck:  supple, full ROM, no masses, no thyromegaly, no JVD, normal carotid upstroke, no carotid bruits, and no cervical lymphadenopathy.   Lungs:  R wheezes/rhonchi and L wheezes/rhonchi.  she has good air movement and with O2 she has no resp. distress. normal respiratory effort, no intercostal retractions, no accessory muscle use, no dullness, R wheezes, and L wheezes.   Heart:  normal rate, regular rhythm, no murmur, no gallop, and no rub.   Abdomen:  soft, non-tender, normal bowel sounds, no distention, no masses, no guarding, no rigidity, no rebound tenderness, no abdominal hernia, no inguinal hernia, no hepatomegaly, and no splenomegaly.   Msk:  normal ROM, no joint tenderness, no joint swelling, no joint warmth, no redness over joints, no joint deformities, no joint instability, and no crepitation.   Pulses:  R and L carotid,radial,femoral,dorsalis pedis and posterior tibial pulses are full and equal bilaterally Extremities:  No clubbing, cyanosis, edema, or deformity noted with normal full  range of motion of all joints.   Neurologic:  No cranial nerve deficits noted. Station and gait are normal. Plantar reflexes are down-going bilaterally. DTRs are symmetrical throughout. Sensory, motor and coordinative functions appear intact. Skin:  Intact without suspicious lesions or rashes Cervical Nodes:  No lymphadenopathy noted Psych:  Cognition and judgment appear intact. Alert and cooperative with normal attention span and concentration. No apparent delusions, illusions, hallucinations Additional Exam:  she received a jet neb of  albuterol 2.5 mg and ipatroprium and her wheezs have diminished and rhonchi have resolved. she has good air movement.   Impression & Recommendations:  Problem # 1:  BRONCHITIS-ACUTE (ICD-466.0) Assessment New  Her updated medication list for this problem includes:    Xopenex 1.25 Mg/24ml Nebu (Levalbuterol hcl) ..... Inhale 1 vial via hhn three times a day and as needed    Xopenex Hfa 45 Mcg/act Aero (Levalbuterol tartrate) .[name]... 2 puffs, four times a day as needed rescue inhaler    Qvar 80 Mcg/act Aers (Beclomethasone dipropionate) ..... Inhale 2 puffs two times a day and rinse mouth well after use    Tessalon Perles 100 Mg Caps (Benzonatate) .[name]... 1-2 four times a day as needed cough    Avelox 400 Mg Tabs (Moxifloxacin hcl) ..... One by mouth once daily for 10 days  Orders: Nebulizer Tx (16109)  Problem # 2:  UTI (ICD-599.0) Assessment: Deteriorated  Her updated medication list for this problem includes:    Avelox 400 Mg Tabs (Moxifloxacin hcl) ..... One by mouth once daily for 10 days  Orders: Venipuncture (60454) TLB-Lipid Panel (80061-LIPID) TLB-BMP (Basic Metabolic Panel-BMET) (80048-METABOL) TLB-CBC Platelet - w/Differential (85025-CBCD) TLB-Hepatic/Liver Function Pnl (80076-HEPATIC) TLB-TSH (Thyroid Stimulating Hormone) (84443-TSH) T-Urine Culture (Spectrum Order) ([id number]) TLB-Udip w/ Micro (81001-URINE)  Problem # 3:  COPD (ICD-496) Assessment: Deteriorated  try depo-medrol IM Her updated medication list for this problem includes:    Xopenex 1.25 Mg/84ml Nebu (Levalbuterol hcl) ..... Inhale 1 vial via hhn three times a day and as needed    Xopenex Hfa 45 Mcg/act Aero (Levalbuterol tartrate) .[name]... 2 puffs, four times a day as needed rescue inhaler    Qvar 80 Mcg/act Aers (Beclomethasone dipropionate) ..... Inhale 2 puffs two times a day and rinse mouth well after use  Orders: Nebulizer Tx (29562)  Problem # 4:  PURE HYPERCHOLESTEROLEMIA  (ICD-272.0) Assessment: Unchanged  Orders: Venipuncture (13086) TLB-Lipid Panel (80061-LIPID) TLB-BMP (Basic Metabolic Panel-BMET) (80048-METABOL) TLB-CBC Platelet - w/Differential (85025-CBCD) TLB-Hepatic/Liver Function Pnl (80076-HEPATIC) TLB-TSH (Thyroid Stimulating Hormone) (84443-TSH) T-Urine Culture (Spectrum Order) [id number]) TLB-Udip w/ Micro (81001-URINE)  Labs Reviewed: SGOT: 45 (07/21/2010)   SGPT: 29 (07/21/2010)  Lipid Goals: Chol Goal: 200 (11/27/2008)   HDL Goal: 40 (11/27/2008)   LDL Goal: 100 (11/27/2008)   TG Goal: 150 (11/27/2008)  10 Yr Risk Heart Disease: 6 % Prior 10 Yr Risk Heart Disease: 9 % (05/06/2010)   HDL:80.10 (02/10/2010), 74.60 (10/30/2009)  LDL:71 (10/30/2009), DEL (28/41/3244)  Chol:206 (02/10/2010), 163 (10/30/2009)  Trig:114.0 (02/10/2010), 86.0 (10/30/2009)  Problem # 5:  HYPOTHYROIDISM (ICD-244.9) Assessment: Unchanged  Her updated medication list for this problem includes:    Levoxyl 100 Mcg Tabs (Levothyroxine sodium) .[name]... Take 1 tablet by mouth once a day  Orders: Venipuncture (01027) TLB-Lipid Panel (80061-LIPID) TLB-BMP (Basic Metabolic Panel-BMET) (80048-METABOL) TLB-CBC Platelet - w/Differential (85025-CBCD) TLB-Hepatic/Liver Function Pnl (80076-HEPATIC) TLB-TSH (Thyroid Stimulating Hormone) (84443-TSH) T-Urine Culture (Spectrum Order) ([id number]) TLB-Udip w/ Micro (81001-URINE)  Labs Reviewed: TSH: 2.65 (05/06/2010)    HgBA1c: 5.3 (05/06/2010) Chol: 206 (02/10/2010)  HDL: 80.10 (02/10/2010)   LDL: 71 (10/30/2009)   TG: 114.0 (02/10/2010)  Complete Medication List: 1)  Alprazolam 0.5 Mg Tabs (Alprazolam) .... Two tablet as needed 2)  Centrum Silver Tabs (Multiple vitamins-minerals) .... Take 1 tablet by mouth once a day 3)  Levoxyl 100 Mcg Tabs (Levothyroxine sodium) .... Take 1 tablet by mouth once a day 4)  Oxygen  .... 2l at bedtime and as needed 5)  Xopenex 1.25 Mg/69ml Nebu (Levalbuterol hcl) .... Inhale 1 vial  via hhn three times a day and as needed 6)  Xopenex Hfa 45 Mcg/act Aero (Levalbuterol tartrate) .... 2 puffs, four times a day as needed rescue inhaler 7)  Aspirin Adult Low Strength 81 Mg Tbec (Aspirin) .... Take 1 by mouth once daily 8)  Qvar 80 Mcg/act Aers (Beclomethasone dipropionate) .... Inhale 2 puffs two times a day and rinse mouth well after use 9)  Tessalon Perles 100 Mg Caps (Benzonatate) .[name].. 1-2 four times a day as needed cough 10)  Promethazine Hcl 25 Mg Tabs (Promethazine hcl) .[name].. 1-2 by mouth four times a day as needed nausea 11)  Hydrocodone-acetaminophen 5-325 Mg Tabs (Hydrocodone-acetaminophen) .[name].. 1-2 by mouth two times a day as needed pain 12)  Avelox 400 Mg Tabs (Moxifloxacin hcl) .... One by mouth once daily for 10 days  Lipid Assessment/Plan:      Based on NCEP/ATP III, the patient's risk factor category is "history of diabetes".  The patient's lipid goals are as follows: Total cholesterol goal is 200; LDL cholesterol goal is 100; HDL cholesterol goal is 40; Triglyceride goal is 150.    Patient Instructions: 1)  Please schedule a follow-up appointment in 2 weeks. 2)  Take your antibiotic as prescribed until ALL of it is gone, but stop if you develop a rash or swelling and contact our office as soon as possible. 3)  Acute bronchitis symptoms for less than 10 days are not helped by antibiotics. take over the counter cough medications. call if no improvment in  5-7 days, sooner if increasing cough, fever, or new symptoms( shortness of breath, chest pain). Prescriptions: AVELOX 400 MG TABS (MOXIFLOXACIN HCL) One by mouth once daily for 10 days  #10 x 0   Entered and Authorized by:   [name] MD   Signed by:   [name] MD on 08/25/2010   Method used:   Electronically to        [facility]  [address] [id number] ([facility])       [address]       [place], [place]  [address]       Ph: [phone] or [phone]       Fax: [phone]   RxID:    [id number]    Medication Administration  Injection # 1:    Medication: Depo- Medrol 80mg     Route: IM    Site: LUOQ gluteus    Exp Date: 12/18/2012    Lot #: [id number]    Mfr: Pharmacia    Comments: pt rec 120mg     Patient tolerated injection without complications    Given by: Lanier Prude, CMA(AAMA) (August 25, 2010 11:26 AM)  Injection # 2:    Medication: Depo- Medrol 40mg     Comments: same as above    Patient tolerated injection without complications    Given by: Lanier Prude, CMA(AAMA) (August 25, 2010 11:26 AM)  Orders Added: 1)  Venipuncture [56387] 2)  TLB-Lipid  Panel [80061-LIPID] 3)  TLB-BMP (Basic Metabolic Panel-BMET) [80048-METABOL] 4)  TLB-CBC Platelet - w/Differential [85025-CBCD] 5)  TLB-Hepatic/Liver Function Pnl [80076-HEPATIC] 6)  TLB-TSH (Thyroid Stimulating Hormone) [84443-TSH] 7)  T-Urine Culture (Spectrum Order) [[id number]] 8)  TLB-Udip w/ Micro [81001-URINE] 9)  Est. Patient Level IV [09811] 10)  Nebulizer Tx [91478]     Appended Document: FU / CD # NWS    Clinical Lists Changes  Orders: Added new Service order of Nebulizer Tx (29562) - Signed       Medication Administration  Injection # 1:    Medication: DuoNeb     Comments: SAMPLES in office  Orders Added: 1)  Nebulizer Tx [phone]

## 2010-08-31 NOTE — Letter (Signed)
Summary: Lipid Letter  [facility]  [address] [place], [place] [address]   Phone: [phone]  Fax: [phone]    08/25/2010  [name] [address] [place], [place]  [address]  Dear Ms. Pitner:  We have carefully reviewed your last lipid profile from 10/30/2009 and the results are noted below with a summary of recommendations for lipid management.    Cholesterol:       245     Goal: <200   HDL "good" Cholesterol:   27.25     Goal: >40   LDL "bad" Cholesterol:   153     Goal: <100 wow   Triglycerides:       180.0     Goal: <150        TLC Diet (Therapeutic Lifestyle Change): Saturated Fats & Transfatty acids should be kept < 7% of total calories ***Reduce Saturated Fats Polyunstaurated Fat can be up to 10% of total calories Monounsaturated Fat Fat can be up to 20% of total calories Total Fat should be no greater than 25-35% of total calories Carbohydrates should be 50-60% of total calories Protein should be approximately 15% of total calories Fiber should be at least 20-30 grams a day ***Increased fiber may help lower LDL Total Cholesterol should be < 200mg /day Consider adding plant stanol/sterols to diet (example: Benacol spread) ***A higher intake of unsaturated fat may reduce Triglycerides and Increase HDL    Adjunctive Measures (may lower LIPIDS and reduce risk of Heart Attack) include: Aerobic Exercise (20-30 minutes 3-4 times a week) Limit Alcohol Consumption Weight Reduction Aspirin 75-81 mg a day by mouth (if not allergic or contraindicated) Dietary Fiber 20-30 grams a day by mouth     Current Medications: 1)    Alprazolam 0.5 Mg  Tabs (Alprazolam) .... Two tablet as needed 2)    Centrum Silver   Tabs (Multiple vitamins-minerals) .... Take 1 tablet by mouth once a day 3)    Levoxyl 100 Mcg  Tabs (Levothyroxine sodium) .... Take 1 tablet by mouth once a day 4)    Oxygen  .... 2l at bedtime and as needed 5)    Xopenex 1.25 Mg/38ml  Nebu  (Levalbuterol hcl) .... Inhale 1 vial via hhn three times a day and as needed 6)    Xopenex Hfa 45 Mcg/act Aero (Levalbuterol tartrate) .... 2 puffs, four times a day as needed rescue inhaler 7)    Aspirin Adult Low Strength 81 Mg Tbec (Aspirin) .... Take 1 by mouth once daily 8)    Qvar 80 Mcg/act Aers (Beclomethasone dipropionate) .... Inhale 2 puffs two times a day and rinse mouth well after use 9)    Tessalon Perles 100 Mg Caps (Benzonatate) .[name].. 1-2 four times a day as needed cough 10)    Promethazine Hcl 25 Mg Tabs (Promethazine hcl) .[name].. 1-2 by mouth four times a day as needed nausea 11)    Hydrocodone-acetaminophen 5-325 Mg Tabs (Hydrocodone-acetaminophen) .[name].. 1-2 by mouth two times a day as needed pain 12)    Avelox 400 Mg Tabs (Moxifloxacin hcl) .... One by mouth once daily for 10 days  If you have any questions, please call. We appreciate being able to work with you.   Sincerely,    [facility] [name] MD

## 2010-09-03 LAB — APTT: aPTT: 30 seconds (ref 24–37)

## 2010-09-03 LAB — CBC
Platelets: 249 10*3/uL (ref 150–400)
RBC: 3.8 MIL/uL — ABNORMAL LOW (ref 3.87–5.11)
WBC: 7.4 10*3/uL (ref 4.0–10.5)

## 2010-09-03 LAB — PROTIME-INR
INR: 0.94 (ref 0.00–1.49)
Prothrombin Time: 12.8 seconds (ref 11.6–15.2)

## 2010-09-08 ENCOUNTER — Ambulatory Visit (INDEPENDENT_AMBULATORY_CARE_PROVIDER_SITE_OTHER): Payer: Medicare Other | Admitting: Internal Medicine

## 2010-09-08 ENCOUNTER — Encounter: Payer: Self-pay | Admitting: Internal Medicine

## 2010-09-08 ENCOUNTER — Other Ambulatory Visit: Payer: Medicare Other

## 2010-09-08 VITALS — BP 126/80 | HR 95 | Temp 97.7°F | Wt 177.0 lb

## 2010-09-08 DIAGNOSIS — E785 Hyperlipidemia, unspecified: Secondary | ICD-10-CM | POA: Insufficient documentation

## 2010-09-08 DIAGNOSIS — N390 Urinary tract infection, site not specified: Secondary | ICD-10-CM

## 2010-09-08 DIAGNOSIS — J449 Chronic obstructive pulmonary disease, unspecified: Secondary | ICD-10-CM | POA: Insufficient documentation

## 2010-09-08 LAB — CA 125: CA 125: 21.1 U/mL (ref 0.0–30.2)

## 2010-09-08 MED ORDER — ROFLUMILAST 500 MCG PO TABS: 500.0000 ug | ORAL_TABLET | Freq: Every day | ORAL | Status: DC

## 2010-09-08 MED ORDER — ROSUVASTATIN CALCIUM 20 MG PO TABS: 20.0000 mg | ORAL_TABLET | Freq: Every day | ORAL | Status: DC

## 2010-09-08 NOTE — Progress Notes (Signed)
Addended by: Rock Nephew on: 09/08/2010 03:23 PM   Modules accepted: Orders

## 2010-09-08 NOTE — Patient Instructions (Signed)

## 2010-09-08 NOTE — Progress Notes (Signed)
  Subjective:    Patient ID: [name], female    DOB: 03-09-1933, 77 y.o.   MRN: [id number]  Urinary Tract Infection  This is a chronic problem. The current episode started in the past 7 days. The problem occurs intermittently. The problem has been unchanged. The quality of the pain is described as burning. The pain is at a severity of 2/10. The patient is experiencing no pain. There has been no fever. She is not sexually active. There is no history of pyelonephritis. Associated symptoms include frequency and urgency. Pertinent negatives include no chills, discharge, flank pain, hematuria, hesitancy, nausea, sweats or vomiting. She has tried antibiotics for the symptoms. The treatment provided mild relief. Her past medical history is significant for recurrent UTIs and a urological procedure.  Hyperlipidemia This is a chronic problem. The current episode started more than 1 year ago. The problem is uncontrolled. Recent lipid tests were reviewed and are high. Exacerbating diseases include obesity. She has no history of liver disease. Factors aggravating her hyperlipidemia include fatty foods. Pertinent negatives include no chest pain, focal sensory loss, focal weakness, leg pain, myalgias or shortness of breath. She is currently on no antihyperlipidemic treatment. The current treatment provides mild improvement of lipids. Compliance problems include medication cost and adherence to diet.  Risk factors for coronary artery disease include no known risk factors.      Review of Systems  Constitutional: Negative for chills.  Respiratory: Positive for cough and wheezing. Negative for apnea, choking, chest tightness, shortness of breath and stridor.   Cardiovascular: Negative for chest pain.  Gastrointestinal: Negative for nausea and vomiting.  Genitourinary: Positive for urgency and frequency. Negative for hesitancy, hematuria and flank pain.  Musculoskeletal: Negative for myalgias.  Neurological:  Negative for focal weakness.       Objective:   Physical Exam  Constitutional: She is oriented to person, place, and time. She appears well-developed. No distress.  HENT:  Head: Normocephalic and atraumatic.  Eyes: Conjunctivae and EOM are normal. Pupils are equal, round, and reactive to light.  Neck: Normal range of motion. Neck supple. No thyromegaly present.  Cardiovascular: Normal rate, regular rhythm, normal heart sounds and intact distal pulses.  Exam reveals no gallop and no friction rub.   No murmur heard. Pulmonary/Chest: Effort normal. No accessory muscle usage. Not tachypneic. No respiratory distress. She has no wheezes. She has rhonchi in the right middle field and the left middle field. She has no rales. She exhibits no tenderness.  Abdominal: She exhibits no distension and no mass. There is no hepatosplenomegaly. There is no tenderness. There is no rebound, no guarding and no CVA tenderness.  Musculoskeletal: She exhibits no edema and no tenderness.  Lymphadenopathy:    She has no cervical adenopathy.  Neurological: She is alert and oriented to person, place, and time. She has normal reflexes. She displays normal reflexes. No cranial nerve deficit. She exhibits normal muscle tone. Coordination normal.  Skin: Skin is warm and dry. No rash noted. She is not diaphoretic. No erythema. No pallor.  Psychiatric: She has a normal mood and affect. Her behavior is normal. Judgment and thought content normal.          Assessment & Plan:

## 2010-09-08 NOTE — Assessment & Plan Note (Signed)
Her recent UA was positive for wbc's, will check a culture today

## 2010-09-23 ENCOUNTER — Telehealth: Payer: Self-pay | Admitting: Internal Medicine

## 2010-09-23 NOTE — Telephone Encounter (Signed)
Pt requests results of labs.

## 2010-09-28 NOTE — Telephone Encounter (Signed)
High cholesterol, otherwise normal

## 2010-10-22 ENCOUNTER — Ambulatory Visit (INDEPENDENT_AMBULATORY_CARE_PROVIDER_SITE_OTHER): Payer: Medicare Other | Admitting: Internal Medicine

## 2010-10-22 ENCOUNTER — Other Ambulatory Visit (INDEPENDENT_AMBULATORY_CARE_PROVIDER_SITE_OTHER): Payer: Medicare Other

## 2010-10-22 ENCOUNTER — Encounter: Payer: Self-pay | Admitting: Internal Medicine

## 2010-10-22 VITALS — BP 112/62 | HR 80 | Temp 98.7°F | Resp 20 | Wt 171.0 lb

## 2010-10-22 DIAGNOSIS — N390 Urinary tract infection, site not specified: Secondary | ICD-10-CM

## 2010-10-22 DIAGNOSIS — J449 Chronic obstructive pulmonary disease, unspecified: Secondary | ICD-10-CM

## 2010-10-22 DIAGNOSIS — J4489 Other specified chronic obstructive pulmonary disease: Secondary | ICD-10-CM

## 2010-10-22 DIAGNOSIS — E039 Hypothyroidism, unspecified: Secondary | ICD-10-CM

## 2010-10-22 LAB — URINALYSIS, ROUTINE W REFLEX MICROSCOPIC
Hgb urine dipstick: NEGATIVE
Ketones, ur: NEGATIVE
Urine Glucose: NEGATIVE
Urobilinogen, UA: 0.2 (ref 0.0–1.0)

## 2010-10-22 NOTE — Progress Notes (Signed)
Subjective:    Patient ID: [name], female    DOB: 1932-09-02, 77 y.o.   MRN: [id number]  Urinary Tract Infection  This is a recurrent problem. The current episode started 1 to 4 weeks ago. The problem occurs intermittently. The problem has been unchanged. The quality of the pain is described as burning. The pain is at a severity of 0/10. The patient is experiencing no pain. There has been no fever. She is not sexually active. There is no history of pyelonephritis. Associated symptoms include frequency and nausea (she has had this intermittently since she started Daliresp). Pertinent negatives include no chills, discharge, flank pain, hematuria, hesitancy, possible pregnancy, sweats, urgency or vomiting. She has tried nothing for the symptoms.      Review of Systems  Constitutional: Negative for fever, chills, diaphoresis, activity change, appetite change, fatigue and unexpected weight change.  HENT: Negative for facial swelling, neck pain and neck stiffness.   Respiratory: Positive for shortness of breath (chronic, stable, and unchanged) and wheezing (at her baseline with no recent exacerbations). Negative for apnea, cough, choking, chest tightness and stridor.   Cardiovascular: Negative for chest pain, palpitations and leg swelling.  Gastrointestinal: Positive for nausea (she has had this intermittently since she started Daliresp). Negative for vomiting, abdominal pain, diarrhea, constipation, blood in stool and abdominal distention.  Genitourinary: Positive for dysuria and frequency. Negative for hesitancy, urgency, hematuria, flank pain, decreased urine volume, enuresis, difficulty urinating, genital sores, menstrual problem, pelvic pain and dyspareunia.  Musculoskeletal: Negative for back pain, joint swelling, arthralgias and gait problem.  Skin: Negative for color change, pallor and rash.  Neurological: Positive for dizziness (she attributes this to Daliresp). Negative for  seizures, syncope, facial asymmetry, speech difficulty, weakness, light-headedness, numbness and headaches.  Hematological: Negative for adenopathy. Does not bruise/bleed easily.  Psychiatric/Behavioral: Negative for hallucinations, behavioral problems, confusion, self-injury, dysphoric mood, decreased concentration and agitation. The patient is not nervous/anxious and is not hyperactive.        Objective:   Physical Exam  Constitutional: She is oriented to person, place, and time. She appears well-developed and well-nourished. No distress.  HENT:  Head: Normocephalic and atraumatic.  Right Ear: External ear normal.  Left Ear: External ear normal.  Nose: Nose normal.  Mouth/Throat: Oropharynx is clear and moist. No oropharyngeal exudate.  Eyes: Conjunctivae and EOM are normal. Pupils are equal, round, and reactive to light. Right eye exhibits no discharge. Left eye exhibits no discharge. No scleral icterus.  Neck: Normal range of motion. Neck supple. No JVD present. No tracheal deviation present. No thyromegaly present.  Cardiovascular: Normal rate, regular rhythm, normal heart sounds and intact distal pulses.  Exam reveals no gallop and no friction rub.   No murmur heard. Pulmonary/Chest: Effort normal. No accessory muscle usage or stridor. Not tachypneic. No respiratory distress. She has no decreased breath sounds. She has no wheezes. She has rhonchi in the right middle field and the left middle field. She has no rales. She exhibits no tenderness.  Abdominal: Soft. Bowel sounds are normal. She exhibits no distension and no mass. There is no tenderness. There is no rebound and no guarding.  Musculoskeletal: Normal range of motion. She exhibits no edema and no tenderness.  Lymphadenopathy:    She has no cervical adenopathy.  Neurological: She is alert and oriented to person, place, and time. She has normal reflexes. No cranial nerve deficit. Coordination normal.  Skin: Skin is warm and dry.  No rash noted. She is not diaphoretic.  No erythema. No pallor.  Psychiatric: She has a normal mood and affect. Her behavior is normal. Judgment and thought content normal.        Lab Results  Component Value Date   WBC 7.2 08/25/2010   HGB 14.4 08/25/2010   HCT 42.2 08/25/2010   PLT 223.0 08/25/2010   CHOL 245* 08/25/2010   TRIG 180.0* 08/25/2010   HDL 73.60 08/25/2010   LDLDIRECT 153.5 08/25/2010   ALT 31 08/25/2010   AST 66* 08/25/2010   NA 140 08/25/2010   K 4.4 08/25/2010   CL 99 08/25/2010   CREATININE 0.9 08/25/2010   BUN 8 08/25/2010   CO2 32 08/25/2010   TSH 2.09 08/25/2010   INR 0.94 02/05/2010   HGBA1C 5.3 05/06/2010    Assessment & Plan:

## 2010-10-22 NOTE — Patient Instructions (Signed)
Chronic Obstructive Pulmonary Disease (COPD) Chronic obstructive pulmonary disease (COPD) is a condition in which airflow from the lungs is restricted. The lungs can never return to normal, but there are measures you can take which will improve them and make you feel better. CAUSES  Smoking.   Breathing in irritants (pollution, cigarette smoke, strong odors, aerosol sprays, paint fumes).   History of lung infections.  TREATMENT  Treatment focuses on making you comfortable (supportive care).  HOME CARE INSTRUCTIONS  If you smoke, stop smoking. The carbon monoxide buildup in the blood robs you of your already short oxygen supply.   Take medicines (antibiotics) that kill germs as directed.   Avoid antihistamines and cough syrups. They dry up your system and slow down the elimination of secretions. This decreases respiratory capacity and may lead to infections.   Drink enough water and fluids to keep your urine clear or pale yellow. This loosens secretions.   Use humidifiers at home and at your bedside if they do not make breathing difficult.   Receive all protective vaccines your caregiver suggests, especially pneumococcal and influenza.   Use home oxygen as suggested.  SEEK MEDICAL CARE IF:  You develop pus-like mucus (sputum).   You have an oral temperature above 100.5.   Breathing is more labored or exercise becomes difficult to do.   You are running out of the medicine you take for your breathing.  SEEK IMMEDIATE MEDICAL CARE IF:  You have a rapid heart rate.   You have agitation, confusion, tremors, or are in a stupor (family members may need to observe this).   It becomes difficult to breathe.   You develop chest pain.  You have an oral temperature above 100.5Urinary Tract Infection (UTI) Infections of the urinary tract can start in several places. A bladder infection (cystitis), a kidney infection (pyelonephritis), and a prostate infection (prostatitis) are different  types of urinary tract infections. They usually get better if treated with medicines (antibiotics) that kill germs. Take all the medicine until it is gone. You or your child may feel better in a few days, but TAKE ALL MEDICINE or the infection may not respond and may become more difficult to treat. HOME CARE INSTRUCTIONS Drink enough water and fluids to keep the urine clear or pale yellow. Cranberry juice is especially recommended, in addition to large amounts of water.  Avoid caffeine, tea, and carbonated beverages. They tend to irritate the bladder.  Alcohol may irritate the prostate.  Only take over-the-counter or prescription medicines for pain, discomfort, or fever as directed by your caregiver.  FINDING OUT THE RESULTS OF YOUR TEST Not all test results are available during your visit. If your or your child's test results are not back during the visit, make an appointment with your caregiver to find out the results. Do not assume everything is normal if you have not heard from your caregiver or the medical facility. It is important for you to follow up on all test results. TO PREVENT FURTHER INFECTIONS: Empty the bladder often. Avoid holding urine for long periods of time.  After a bowel movement, women should cleanse from front to back. Use each tissue only once.  Empty the bladder before and after sexual intercourse.  SEEK MEDICAL CARE IF: There is back pain.  You or your child has an oral temperature above 100.5.  Your baby is older than 3 months with a rectal temperature of 100.5 F (38.1 C) or higher for more than 1 day.  Your  or your child's problems (symptoms) are no better in 3 days. Return sooner if you or your child is getting worse.  SEEK IMMEDIATE MEDICAL CARE IF: There is severe back pain or lower abdominal pain.  You or your child develops chills.  You or your child has an oral temperature above 100.5, not controlled by medicine.  Your baby is older than 3 months with a  rectal temperature of 102 F (38.9 C) or higher.  Your baby is 77 months old or younger with a rectal temperature of 100.4 F (38 C) or higher.  There is nausea or vomiting.  There is continued burning or discomfort with urination.  MAKE SURE YOU: Understand these instructions.  Will watch this condition.  Will get help right away if you or your child is not doing well or gets worse.  Document Released: 03/16/2005 Document Re-Released: 08/31/2009  [facility] Patient Information 2011 [place], [place]., not controlled by medicine.  MAKE SURE YOU:   Understand these instructions.   Will watch your condition.   Will get help right away if you are not doing well or get worse.  Document Released: 03/16/2005 Document Re-Released: 08/31/2009 [facility] Patient Information 2011 [place], [place].

## 2010-10-24 ENCOUNTER — Encounter: Payer: Self-pay | Admitting: Internal Medicine

## 2010-10-24 NOTE — Assessment & Plan Note (Signed)
Her most recent TSH shows that she is euthyroid

## 2010-10-24 NOTE — Assessment & Plan Note (Signed)
Check UA and culture 

## 2010-10-24 NOTE — Assessment & Plan Note (Signed)
Stop daliresp due to side effects, continue the other meds

## 2010-10-26 LAB — CULTURE, URINE COMPREHENSIVE: Colony Count: 40000

## 2010-11-02 NOTE — Letter (Signed)
December 20, 2006   Genene Churn. Cyndie Chime, MD.   Re:  [name]             DOB:  26-May-1933   Dear [name]:   I saw [name] back in the office today.  Her CT scan showed no  evidence of recurrence over three years since her surgery.  She is doing  remarkably well.  Her blood pressure is 138/77, pulse 99, respirations  18, and SATs are 92%.  Lungs were clear to auscultation and percussion.  The incision was well-healed, and there was no supraclavicular or  axillary adenopathy.  Apparently you will see her again in six months  with a CT scan, and I will happy to see her again if she has any future  problems, but I will let you continue the long term followup.  I  appreciate the opportunity of seeing [name].   [name], M.D.  Electronically Signed   DPB/MEDQ  D:  12/20/2006  T:  12/20/2006  Job:  [id number]   cc:   [name], M.D.

## 2010-11-05 NOTE — Op Note (Signed)
[name], [name]             ACCOUNT NO.:  [id number]   MEDICAL RECORD NO.:  [id number]          PATIENT TYPE:  OIB   LOCATION:  2899                         FACILITY:  [facility]   PHYSICIAN:  Ines Bloomer, M.D. DATE OF BIRTH:  06-20-1933   DATE OF PROCEDURE:  03/31/2004  DATE OF DISCHARGE:                                 OPERATIVE REPORT   PREOPERATIVE DIAGNOSIS:  Persistent cough, status post right upper  lobectomy.   POSTOPERATIVE DIAGNOSIS:  Persistent cough, status post right upper  lobectomy.   OPERATION PERFORMED:  Video bronchoscopy.   SURGEON:  Ines Bloomer, M.D.   ANESTHESIA:  1% Xylocaine, Cetacaine and general anesthesia.   DESCRIPTION OF PROCEDURE:  After local anesthesia with Cetacaine, Xylocaine  and IV sedation, the video bronchoscope was passed into the endotracheal  tube.  The carina was in the midline, the left main stem, left upper lobe  and left lower lobe orifices.  On the right side there was some distortion  of the bronchus intermedius but it could easily pass through the bronchus  intermedius and see the right middle lobe and right lower lobe.  Then the  stump of the right upper lobe was evaluated and there were two orifices.  The patient had previously had an anterior branch and then an apical  posterior branch to the right upper lobe, and they had been ligated  separately with the anterior branch coming off separately from the other  branch of the trachea.  Both of these were seen but the patient became very  sleepy from the sedation and had to be intubated for a short period of time  so the fiberoptic bronchoscope was passed through the endotracheal tube and  both orifices were probed and pictures were taken.  There was no evidence of  any type of bronchopleural fistula.  The stumps appeared to be healing  satisfactorily.  The video bronchoscope was removed and cultures were taken.  The patient was then transferred to the recovery room in  stable condition.       DPB/MEDQ  D:  03/31/2004  T:  03/31/2004  Job:  [id number]

## 2010-11-05 NOTE — Discharge Summary (Signed)
[name], [name]                       ACCOUNT NO.:  [id number]   MEDICAL RECORD NO.:  [id number]                   PATIENT TYPE:  INP   LOCATION:  2002                                 FACILITY:  [facility]   PHYSICIAN:  Ines Bloomer, M.D.              DATE OF BIRTH:  October 15, 1932   DATE OF ADMISSION:  10/26/2003  DATE OF DISCHARGE:  10/29/2003                                 DISCHARGE SUMMARY   ADMITTING DIAGNOSIS:  Symptomatic right pleural effusion.   PAST MEDICAL HISTORY:  1. Squamous cell carcinoma of the lung, status post right upper lobectomy     April 2005 by Dr. Edwyna Shell.  2. Hypothyroidism.  3. History of ruptured lumbar disk with surgical repair.  4. Other surgical history includes a hysterectomy at age 48.   ALLERGIES:  1. PENICILLIN.  2. SULFA.  3. CODEINE.  4. AMPICILLIN.   DISCHARGE DIAGNOSIS:  Postsurgical right pleural effusion, improved.   BRIEF HISTORY:  [name] is a 77 year old Caucasian female.  She was  hospitalized last month for right upper lobe lung mass which was resected  September 22, 2003 by Dr. Edwyna Shell.  Final pathology was returned as squamous cell  carcinoma, staged at stage IA.  She was followed up by Dr. Edwyna Shell at the  office and a CT scan revealed right pleural fluid collection.  A  thoracentesis was attempted in the office but no fluid was obtained.  On Oct 26, 2003, she noted increased shortness of breath and discomfort.  She  presented to the emergency department at [facility] and was  evaluated by Dr. Tyrone Sage.  He felt she required admission for further  evaluation of her pleural effusion as well as optimizing her pain control.   HOSPITAL COURSE:  On Oct 26, 2003, [name] was admitted to [facility] under the care of [place].  Laboratory studies were obtained as were  serial chest x-rays for the next three days.  She was also started on  supplemental oxygen therapy to maintain her oxygen saturations greater than  90%.  [name]' general condition improved over the next 48 hours.  Her  chest x-ray on May 9 was reviewed by Dr. Edwyna Shell and he felt it was improved.  Her supplemental oxygen had been discontinued.  O2 saturation was 96% on  room air.  This morning, May 10, on morning rounds Dr. Edwyna Shell reviewed her  chest x-ray and this was stable.  She clinically is feeling much better.  She is ambulating without difficulty without oxygen.  She is tolerating her  diet.  Her pain is well controlled.  Dr. Edwyna Shell plans to repeat her  laboratory studies in the morning and then discharge home.   CONDITION ON DISCHARGE:  Improved.   DISCHARGE MEDICATIONS:  She is to resume her home medicines of:  1. Synthroid 100 mcg daily.  2. Premarin, her dose prior to admission.  3.  Lasix 40 mg daily.   PAIN MANAGEMENT:  She may continue to use Tylenol Extra Strength 1-2 tablets  p.r.n.   ACTIVITY:  Limited only by no heavy lifting.   DIET:  Not restricted.   WOUND CARE:  Not applicable.   FOLLOW UP:  Dr. Edwyna Shell would like to see her next week at the [facility] office on  Thursday, May 19, at 4:30 in the afternoon.  She will be asked to have a  chest x-ray at [facility] at 3:30 in the afternoon and to bring her chest x-ray  with her for Dr. Edwyna Shell to review.      Toribio Harbour, N.P.                  Ines Bloomer, M.D.    CTK/MEDQ  D:  10/28/2003  T:  10/29/2003  Job:  [id number]

## 2010-11-05 NOTE — H&P (Signed)
NAMEFINLEY, [name]                       ACCOUNT NO.:  [id number]   MEDICAL RECORD NO.:  [id number]                   PATIENT TYPE:  INP   LOCATION:  3315                                 FACILITY:  [facility]   PHYSICIAN:  Ines Bloomer, M.D.              DATE OF BIRTH:  1932/10/21   DATE OF ADMISSION:  10/26/2003  DATE OF DISCHARGE:                                HISTORY & PHYSICAL   CHIEF COMPLAINT:  Right-sided pain with decreased appetite, increasing  weakness, nausea and also noted by home nurse to be pale.   HISTORY OF THE PRESENT ILLNESS:  This is a 77 year old who was hospitalized  last month with a right upper lobe mass diagnosed as squamous cell carcinoma  and underwent a right upper lobe lobectomy by Dr. Edwyna Shell on September 22, 2003.  She was found on pathology to be a stage 1A, T1N0M0.  She initially did well  after discharge for the first two to three weeks; however, did require some  home oxygen.  Her home nurse noted approximately two weeks ago that she was  much more pale and reportedly per the patient, she underwent some blood  tests at Dr. Annie Sable which noted that she had a hyponatremia as well as  was noted to be somewhat anemic.  She is not aware of the exact figures  involved.  She continued to have difficulty with right-sided chest  discomfort.  She also noted nausea and some vomiting.  She has a cough;  however, it is nonproductive.  She did have one episode of a low-grade fever  but for the most part has had no significant constitutional symptoms.  She  was seen by Dr. Merril Abbe last week and a chest x-ray was obtained and this  revealed a right-sided possible effusion.  In consultation with Dr. Edwyna Shell a  CT scan was then obtained and the patient brought this scan last week to Dr.  Edwyna Shell and the an attempt at thoracentesis was made but apparently he was  unable to get but a very small amount of fluid.  On today's day she is  having more pain and called Dr.  Tyrone Sage, the on call physician, who told  her to come to the emergency room and she is to be admitted for further  evaluation and treatment.   ALLERGIES:  1. PENICILLIN.  2. SULFA.  3. CODEINE.  4. AMOXYCILLIN.   PAST MEDICAL HISTORY/PAST SURGICAL HISTORY:  1. Hypothyroidism.  2. History of a ruptured lumbar disc, status post surgical repair.  3. History of a hysterectomy at age 31.  4. Right upper lobe lobectomy and thoracotomy for squamous cell cancer as     described above in April of 2005.   MEDICATIONS CURRENTLY:  1. Ultram 50 mg q. 6.  2. Tylenol p.r.n.  3. Synthroid 100 mcg q. day.  4. Premarin 0.9 q. day.  5. Lasix 40 mg q. day.  FAMILY HISTORY:  Father is deceased from cardiac disease as well as a  history of a CVA, he had both MI and congestive heart failure prior to his  death.  His one brother who had a cerebral hemorrhage felt to be secondary  to hypertension.  One brother who has coronary artery disease and has had a  bypass and one sister who has arthritis.   SOCIAL HISTORY:  She is married with two children.  Prior to her thoracotomy  she was using about 2-3 ounces of alcohol a day.  She does have a heavy  tobacco history of approximately 45 years but quit recently and has been  trying to use the nicotine patch.   REVIEW OF SYMPTOMS:  Is essentially otherwise noncontributory.  She did have  one episode during the past two weeks that required catheterization for  urinary retention.  She just for the most part feels very poorly.   PHYSICAL EXAMINATION:  VITAL SIGNS: Temperature 97.6, blood pressure 145/99,  pulse 96, respirations 22.  GENERAL: This is an ill-appearing female in no acute distress.  She does  appear pale.  NECK: Supple.  No jugular venous distention.  No lymphadenopathy.  No  tracheal deviation.  LUNGS: Clear; however, less air exchange is noted on the right compared with  the left.  CARDIAC: Regular rate and rhythm.  S1 and S2.   ABDOMINAL EXAM: Soft, nontender.  No masses.  No organomegaly.  NEURO: Grossly nonfocal.  GENITOURINARY/RECTAL: Deferred.  EXTREMITIES: No clubbing, cyanosis or edema.  She has palpable pulses  throughout.  Incision is well-healed.  There is no drainage.   ASSESSMENT:  Large pleural effusion on the right following thoracotomy for  right upper lobe lobectomy.  Possibly could be loculated or an empyema.   PLAN:  For admission for further evaluation and treatment with baseline lab  studies and followup x-rays.      Rowe Clack, P.A.-C.                    Ines Bloomer, M.D.    Sherryll Burger  D:  10/26/2003  T:  10/27/2003  Job:  [id number]   cc:   Ines Bloomer, M.D.  [address]  [place]  [place] [address]

## 2010-11-05 NOTE — Op Note (Signed)
[name], [name]                       ACCOUNT NO.:  [id number]   MEDICAL RECORD NO.:  [id number]                   PATIENT TYPE:  INP   LOCATION:  3306                                 FACILITY:  [facility]   PHYSICIAN:  Ines Bloomer, M.D.              DATE OF BIRTH:  [birth year]   DATE OF PROCEDURE:  09/22/2003  DATE OF DISCHARGE:                                 OPERATIVE REPORT   PREOPERATIVE DIAGNOSIS:  Right upper lobe mass.   POSTOPERATIVE DIAGNOSIS:  Squamous cell cancer of right upper lobe.   OPERATION PERFORMED:  Right video assisted thoracoscopic surgery, right  thoracotomy, right upper lobectomy with node  dissection.   SURGEON:  Ines Bloomer, M.D.   ASSISTANT:  Rowe Clack, P.A.-C.   ANESTHESIA:  General.   INDICATIONS FOR PROCEDURE:  This patient was found to have right upper lobe  lesion that was about 1 cm in size.  PET scan was positive as __________  lesion.  Brought to the operating room for resection.   DESCRIPTION OF PROCEDURE:  After general anesthesia, the patient was turned  to the right lateral thoracotomy position was prepped and draped in the  usual sterile manner.  A dual lumen tube was inserted.  Two trocar sites  were made in the anterior and posterior axillary line at the 7th intercostal  space.  Two trocars were inserted.  A 30 degree scope was inserted.  No  spread of the cancer could be seen.  The lesion appeared to be deep in the  left upper lobe.  A posterolateral thoracotomy was made after removing the  scope, the latissimus was divided with the electrocautery.  The serratus was  reflected anteriorly and the fifth intercostal space was entered and two  Tuffiers were placed at right angles.  The lesion was palpated in the  anterior segment of the left upper lobe.  The minor fissure was partially  divided with two applications of the EZ-45 stapler and the superior portion  of the major fissure was divided with one application.  Attention was turned  posteriorly and the posterior mediastinum was dissected out, dissecting out  the posterior branch of the right upper lobe which was ligated with 2-0  silk, doubly clipped and divided.  Some 11R nodes were dissected free.  The  dissection was brought superiorly, dissecting out two branches, the apical  posterior branch and the anterior branch.  The anterior branch actually came  off separately __________  and had a branch going between the apical  posterior branch of the bronchus and the anterior and posterior branch of  the bronchus.  The apical posterior branch of the pulmonary artery was  looped with a vascular tape, stapled and divided.  Attention was then turned  to the superior pulmonary vein and the apical posterior branch of the  superior pulmonary vein was stapled and divided.  Finally, the rest of the  superior pulmonary vein to the right upper lobe was looped with a vascular  tape, stapled and divided with an autosuture stapler.  After all branches  had been divided, the attention was turned to the major fissure and  dissection was carried down to the pulmonary artery and the superior portion  of the major fissure was dissected out and looped with a staple and divided  with the EZ-45 stapler.  Then the bronchus was dissected out.  As mentioned,  the bronchus was at an apical posterior branch and an anterior posterior  branch and these had to be stapled separately using the EZ-45 stapler to  staple the superior one and the TL-30 Ethicon stapler to staple the inferior  one and then dividing the inferior one distal to the staple line with a  knife.  Then the rest of the minor fissure was divided with the EZ-45  stapler.  There was another small branch going to the upper lobe which was  ligated with 2-0 silk, clipped and divided.  Part of the suture line was  overrun with 3-0 Vicryl and then Bioglue was applied to the staple line and  the suture line as well as  the bronchus.  The lung was re-expanded.  The  inferior pulmonary ligament was taken down with electrocautery.  Two chest  tubes were brought into the trocar sites and tied in place with 0 silk.  The  chest was closed with three pericostals.  The On-Q catheters were brought in  medially and placed in the paravertebral area. A Marcaine block was done in  the usual fashion.  The pericostals were then tied down with 0 chromic and  muscle was closed with #1 Vicryl, subcutaneous tissue with 2-0 Vicryl and  Dermabond for the skin.  The patient was then transferred to the recovery  room in stable condition.                                               Ines Bloomer, M.D.    DPB/MEDQ  D:  09/22/2003  T:  09/23/2003  Job:  [id number]

## 2010-11-05 NOTE — Consult Note (Signed)
[name], [name]                       ACCOUNT NO.:  [id number]   MEDICAL RECORD NO.:  [id number]                   PATIENT TYPE:  INP   LOCATION:  3306                                 FACILITY:  [facility]   PHYSICIAN:  Genene Churn. Cyndie Chime, M.D.          DATE OF BIRTH:  December 22, 1932   DATE OF CONSULTATION:  09/23/2003  DATE OF DISCHARGE:                                   CONSULTATION   REFERRING PHYSICIAN:  Dr. Ines Bloomer.   REASON FOR CONSULT:  Lung cancer.   HISTORY OF PRESENT ILLNESS:  [name] is a pleasant 77 year old female  asked to see in consultation by Dr. Edwyna Shell, for evaluation of lung cancer.  The patient had a recent history of recurrent bronchitis, since November  2004.  She was treated with antibiotics and steroids, as well as MDI without  changes.  A chest x-ray was finally performed, demonstrating a right upper  lobe nodule, confirmed with a CT of the chest with contrast on August 22, 2003.  This revealed an 11 x 12-mm irregular nodule in the right upper lobe,  noncalcified, suspicious for malignancy; also seen, a small area of nodular  thickening in the right major fissure, as well as a 7-mm right hilar lymph  node and a 9-mm subcarinal node.  She was referred to Dr. Edwyna Shell.  He  ordered a followup PET scan, which showed a malignant uptake in the  spiculated right upper lobe nodule suspicious for primary bronchogenic  carcinoma.  No evidence of local or distal metastasis.  Her maximum SUV was  7.7.  He then recommended right upper lobectomy with node dissection.  Frozen specimen was consistent with squamous cell carcinoma; the formal  pathology reported a 1.4-cm poorly differentiated squamous cell carcinoma of  the right upper lobe, with multiple nodes at levels 10, 11 and 12 negative,  no vascular invasion, T1, N0, M0, stage IA.   PAST MEDICAL HISTORY:  1. Lung cancer, as above.  2. Hypothyroidism.  3. Recurrent bronchitis and sinusitis since November  2004.  4. Chronic back pain.   SURGERIES/PROCEDURES:  1. Status post right upper lobectomy with node dissection, Dr. Edwyna Shell, September 19, 2003.  2. Status post back surgery.   ALLERGIES:  PENICILLIN, SULFA, CODEINE and AMPICILLIN.   CURRENT MEDICATIONS:  1. Dalmane 15 ounces p.o. nightly p.r.n.  2. Nicotine patch q.24 h.  3. Synthroid 100 mcg daily.   REVIEW OF SYSTEMS:  She denies any fever or chills.  No fatigue.  No  significant weight loss or decrease in appetite.  No headaches or blurred  vision.  She does have some dyspnea on exertion but no shortness of breath  at rest.  No productive cough.  No hematemesis.  No chest pain or  palpitations.  No abdominal pain, nausea, vomiting, diarrhea, constipation,  hematochezia, dysphagia, dysuria, gross hematuria, calf tenderness,  dysesthesias or peripheral edema.   FAMILY HISTORY:  Mother died at 18 of possible heart disease.  Father died  of massive MI; he also had a history of CHF and CVA.  One sister alive with  arthritis.  One brother alive with a history of CVA and CAD, status post  CABG.  One brother died with cerebral hemorrhage.   SOCIAL HISTORY:  The patient is married.  She has 2 children.  She is  retired from __________.  She quit 1 pack a day of cigarettes which she  smoked for 45 years; at the time of hospitalization, she was on a half-pack  a day.  Her main caretaker is her spouse, [name], telephone number -- [phone].  She drinks 2-3 cocktails a day.  The patient lives in [place].   PHYSICAL EXAMINATION:  GENERAL:  On physical exam, this is a well-developed,  well-nourished 77 year old white female in no acute distress, alert and  oriented x3.  VITAL SIGNS:  Blood pressure 90/50, pulse 75, respirations 16, temperature  97.6.  Preop weight 176, height 5 feet 6 inches.  Pulse oximetry 92% on 2 L.  HEENT:  Normocephalic, atraumatic.  PERRLA.  Oral mucosa dry.  No visible  thrush.  NECK:  Neck supple.  No  JVD.   No cervical or supraclavicular masses.  CHEST:  Chest symmetrical on inspiration.  Lungs with decreased breath  sounds at the right at the surgical site.  She also has some audible  wheezing, no rhonchi, no rales.  BREASTS:  No axillary masses.  Breasts without masses.  CARDIOVASCULAR:  Regular rate and rhythm without murmurs, rubs, or gallops.  ABDOMEN:  Abdomen soft and nontender.  Bowel sounds x4.  No palpable spleen  or liver.  GU AND RECTAL:  Deferred.  She has a Foley.  EXTREMITIES:  No clubbing, cyanosis, or edema.  SKIN:  Skin without visible bleeding.  NEUROLOGIC:  Nonfocal.   LABORATORIES:  Hemoglobin 12, hematocrit 34.7, white count 11.5, platelets  208,000, MCV 98.3.  PT 12.8, PTT 31, INR 0.9.  Sodium 133, potassium 4.5,  BUN 3, creatinine 0.9, glucose 128, total bilirubin 0.5, alkaline  phosphatase 93, AST 44, ALT 25, total protein 6.6, albumin 3.3, calcium 8.1.   ASSESSMENT AND PLAN:  Dr. Genene Churn. [place] has seen and evaluated this  patient with the recent diagnosis of T1, N0, M0 stage IA poorly  differentiated squamous cell carcinoma of the right upper lobe with multiple  nodes at levels 10, 11 and 12 negative, and no vascular invasion.  Dr.  Cyndie Chime recommends observation alone.  No adjuvant chemotherapy or  radiotherapy will be indicated.  The daughter was at the room when the  pathology was discussed by Dr. Cyndie Chime.   COMMENT:  Thank you very much for allowing [place] to participate in the care of  [name].     [name], P.A.                        Genene Churn. Cyndie Chime, M.D.    SW/MEDQ  D:  09/25/2003  T:  09/25/2003  Job:  [id number]   cc:   Ike Bene, M.D.  [address]  [place]  [place] [address]  Fax: [phone]   Malachi Pro. Ambrose Mantle, M.D.  [address]  [place] [address]  Fax: [phone]   Ines Bloomer, M.D.  [address]  [place]  [place] [address]

## 2010-11-05 NOTE — H&P (Signed)
NAME:  [name], [name]                       ACCOUNT NO.:  [id number]   MEDICAL RECORD NO.:  [id number]                   PATIENT TYPE:  INP   LOCATION:                                       FACILITY:  [facility]   PHYSICIAN:  Ines Bloomer, M.D.              DATE OF BIRTH:  Sep 05, 1932   DATE OF ADMISSION:  09/22/2003  DATE OF DISCHARGE:                                HISTORY & PHYSICAL   CHIEF COMPLAINT:  Right upper lobe lung lesion.   HISTORY OF PRESENT ILLNESS:  The patient is a 77 year old white female who  has had intermittent episodes of recurrent bronchitis since around  Thanksgiving of 2004.  She was initially seen by Dr. Merril Abbe and was  treated with antibiotics and subsequently improved.  However, she states  that once the antibiotics were completed, she once again developed symptoms  of cough with purulent sputum production and shortness of breath.  Her  symptoms persisted, and in January she returned to Dr. Merril Abbe for  followup and was started on Cipro.  She completed her course of Cipro  without significant improvement and was seen back in followup.  She  underwent a chest x-ray which showed a right upper lobe lung nodule.  Her  antibiotics was switched to Avelox, and she was started on a steroid taper  as well as meter dosed inhaler of albuterol.  She underwent a followup CT  scan which confirmed an 11 x 12 mm irregular right upper lobe lung nodule  with some enlargement of hilar and subcarinal lymph nodes.  Because of these  findings, she was referred to Dr. Dewayne Shorter for further evaluation.  The CT  scan was reviewed and was felt to be suspicious for malignancy.  She was  sent for a PET scan, and this showed increased uptake in the area of the  right upper lobe nodule with a SUV of 7.7, which is concerning for  malignancy.  She continues to have coughing spells with some clear and some  purulent sputum production, as well as shortness of breath.  She denies  fevers, chills, weight loss, hemoptysis, or dyspnea on exertion.  She states  that she is not as short of breath as she was when she initially presented.  She does have a history of smoking, and continues to smoke about a 1/2 pack  of cigarettes per day.  Pulmonary function studies performed in our office  showed a FVC of 2.19, or 66% of predicted, and an FEV1 of 1.33, or 51% of  predicted.  It was Dr. Scheryl Darter opinion that she should undergo a right VATS  at this time with a probable right upper lobectomy.   PAST MEDICAL HISTORY:  1. Hypothyroidism.  2. History of a ruptured lumbar disk.  She denies any history of coronary artery disease, hypertension, diabetes  mellitus, hyperlipidemia, CVA, cancer, or COPD.   PAST SURGICAL HISTORY:  1.  Back surgery in the remote past for a ruptured disk.  2. Hysterectomy at age 66.   CURRENT MEDICATIONS:  1. Premarin 0.9 mg daily.  2. Synthroid 100 mcg daily.  3. Multivitamin daily.   ALLERGIES:  1. PENICILLIN (she does not recall the reaction).  2. SULFA, causes GI upset.  3. CODEINE, causes GI upset.  4. AMPICILLIN, causes GI upset.   FAMILY HISTORY:  Her father died of a myocardial infarction and also had a  history of a CVA and congestive heart failure.  She had one brother who died  of a cerebral hemorrhage who also had uncontrolled hypertension.  She has  one brother who has coronary artery disease and is status post CABG, and has  also had a CVA.  She has one sister who has arthritis, but no other chronic  medical problems.   SOCIAL HISTORY:  She is married and has two children.  She resides with her  husband in [place].  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  She previously smoked one pack of cigarettes per day, but is now  down to about 1/2 pack per day, and has smoked for 45 years.  She reports  consuming 2 to 3 alcoholic beverages per day.   REVIEW OF SYSTEMS:  See history of present illness for  pertinent positives  and negatives.  Also, she reports that she still has sinus drainage  despite several rounds of antibiotics.  She also has problems with  indigestion and intestinal gas.  She has intermittent back pain as well as  left leg numbness which comes and goes and is thought to be secondary to her  previous back problems.  She denies fevers, chills, weight loss, fatigue,  TIA symptoms, visual symptoms, amaurosis fugax, dysphagia, chest pain,  palpitations, abdominal pain, nausea, vomiting, diarrhea, constipation,  reflux symptoms, hemoptysis, hematemesis, hematochezia, melena, dysuria,  nocturia, hematuria, lower extremity edema, lower extremity claudication  symptoms, rest pain, intolerance to heat or cold, anxiety or depression.   PHYSICAL EXAMINATION:  VITAL SIGNS:  Blood pressure 120/72, pulse is 80 and  regular, respirations 18 and unlabored.  GENERAL:  This is a well-developed, well-nourished white female in no acute  distress.  HEENT:  Normocephalic, atraumatic.  Pupils equal, round, reactive to light  and accommodation.  Extraocular movements were intact.  TMs and canals are  clear, although both TMs are slightly retracted.  Nares patent bilaterally.  Oropharynx is clear with moist mucous membranes.  NECK:  Supple without lymphadenopathy, thyromegaly, or carotid bruits.  HEART:  Regular rate and rhythm without murmurs, rubs, or gallops.  LUNGS:  Clear to auscultation.  ABDOMEN:  Soft, nontender, nondistended, with active bowel sounds in all  quadrants.  No masses or hepatosplenomegaly.  EXTREMITIES:  No cyanosis, clubbing, or edema.  She has 2+ palpable femoral,  dorsalis pedis, and posterior tibial pulses bilaterally.  NEUROLOGIC:  Cranial nerves II-XII grossly intact.  She is alert and  oriented x3.  Gait is within normal limits.   ASSESSMENT AND PLAN:  This is a 77 year old female with a history of tobacco use who presents with a right upper lobe lung lesion.   She will be admitted  to [facility] on September 22, 2003, by Dr. Dewayne Shorter, and will undergo  a right VATS with a probable right upper lobectomy.      Coral Ceo, P.A.  Ines Bloomer, M.D.    GC/MEDQ  D:  09/17/2003  T:  09/17/2003  Job:  [id number]   cc:   Ike Bene, M.D.  [address]  [place]  [place] [address]  Fax: [phone]

## 2010-11-05 NOTE — Discharge Summary (Signed)
NAMEFALLAN, MCCAREY                       ACCOUNT NO.:  [id number]   MEDICAL RECORD NO.:  [id number]                   PATIENT TYPE:  INP   LOCATION:  3306                                 FACILITY:  [facility]   PHYSICIAN:  Ines Bloomer, M.D.              DATE OF BIRTH:  1932-07-10   DATE OF ADMISSION:  09/22/2003  DATE OF DISCHARGE:  09/28/2003                                 DISCHARGE SUMMARY   HISTORY OF PRESENT ILLNESS:  The patient is a 77 year old white female who  has had intermittent episodes of recurrent bronchitis since around  Thanksgiving of 2004.  She was initially seen by Dr. Merril Abbe, and was  treated with antibiotics, and subsequently improved.  However, once the  antibiotics were completed, she once again developed symptoms of cough with  purulent sputum and shortness of breath.  Her symptoms persisted, and in  January she returned to Dr. Merril Abbe for follow up and was started on  Cipro.  She completed a course of Cipro without significant improvement, and  was seen back in follow up.  A chest x-ray showed a right upper lobe lung  nodule.  Her antibiotics were switched to Avelox, and she was started on a  steroid taper, as well as a metered dose inhaler of albuterol.  A CT scan  was performed which confirmed an 11 x 12 mm irregular right upper lobe lung  nodule with some enlargement of hilar and subcarinal lymph nodes.  Due to  these findings, she was referred to Dr. Dewayne Shorter for further evaluation.   The CT scan was reviewed, and was felt to be suspicious for malignancy.  Subsequent PET scan showed an increased uptake in the area of the right  upper lobe nodule with an SUV of 7.7.  It was Dr. Scheryl Darter opinion that she  should undergo right video assisted thoracoscopy with probable right upper  lobe lobectomy, and she was admitted to this hospitalization for the  procedure.   PAST MEDICAL HISTORY:  1. Hypothyroidism.  2. History of ruptured lumbar disk.   PAST SURGICAL HISTORY:  1. Back surgery.  2. Hysterectomy at age 80.   MEDICATIONS ON ADMISSION:  1. Premarin 0.9 mg daily.  2. Synthroid 100 mcg daily.  3. Multivitamin daily.   ALLERGIES AND INTOLERANCES:  1. PENICILLIN; she does not recall the reaction.  2. SULFA, GI upset.  3. CODEINE, GI upset.  4. AMPICILLIN, GI upset.   FAMILY HISTORY/SOCIAL HISTORY/REVIEW OF SYMPTOMS/PHYSICAL EXAMINATION:  Please see the history and physical done at the time of admission.   HOSPITAL COURSE:  The patient was admitted and electively taken to the  operating room, where she underwent the following procedure - right video  assisted thoracoscopy with thoracotomy, right upper lobe lobectomy, with  lymph node sampling.  The patient tolerated the procedure well, and was  taken to the post anesthesia care unit in stable condition.   POSTOPERATIVE  HOSPITAL COURSE:  The patient did well, responding to the  surgery.  She remained hemodynamically stable.  All routine lines, monitors,  and drainage devices were discontinued in the standard fashion.  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  She was categorized as a T1, N0, M0, stage IA, and it  was Dr. Patsy Lager opinion that observation alone would be warranted at  this time.  The patient was attempted to be weaned from oxygen, but still  required some, and home health care arrangements were made for both nursing,  as well as home oxygen, and the patient would be followed as an outpatient,  as she was deemed otherwise quite stable for discharge.   MEDICATIONS ON DISCHARGE:  1. Resume her home medications.  2. Avelox 400 mg daily for 5 days.  3. She was encouraged to continue nicotine patches as needed.  4. For pain, Ultram 50 mg 1-2 q.6h. as needed.   INSTRUCTIONS:  The patient received written instructions in  regard to  medications, activity, diet, wound care, and follow up.   FOLLOW UP:  Dr. Edwyna Shell on Thursday, October 02, 2003, with a chest x-ray.   CONDITION ON DISCHARGE:  Stable.   FINAL DIAGNOSIS:  Lung carcinoma.   OTHER DIAGNOSES:  As previously listed.      Rowe Clack, P.A.-C.                    Ines Bloomer, M.D.    Sherryll Burger  D:  11/10/2003  T:  11/11/2003  Job:  [id number]   cc:   Genene Churn. Cyndie Chime, M.D.  [address] [facility]  [place]  [place] [address]  Fax: [phone]

## 2010-11-29 IMAGING — CR DG CHEST 2V
2 series · 2 of 2 positions shown · non-contrast
Comparison: 12/24/2008 chest CT scan and 05/22/2008 chest x-ray.

CLINICAL DATA: Cough.  History of lung cancer.  COPD.  Follow-up
evaluation.

CHEST - 2 VIEW

[view not recorded (1 of 2)]
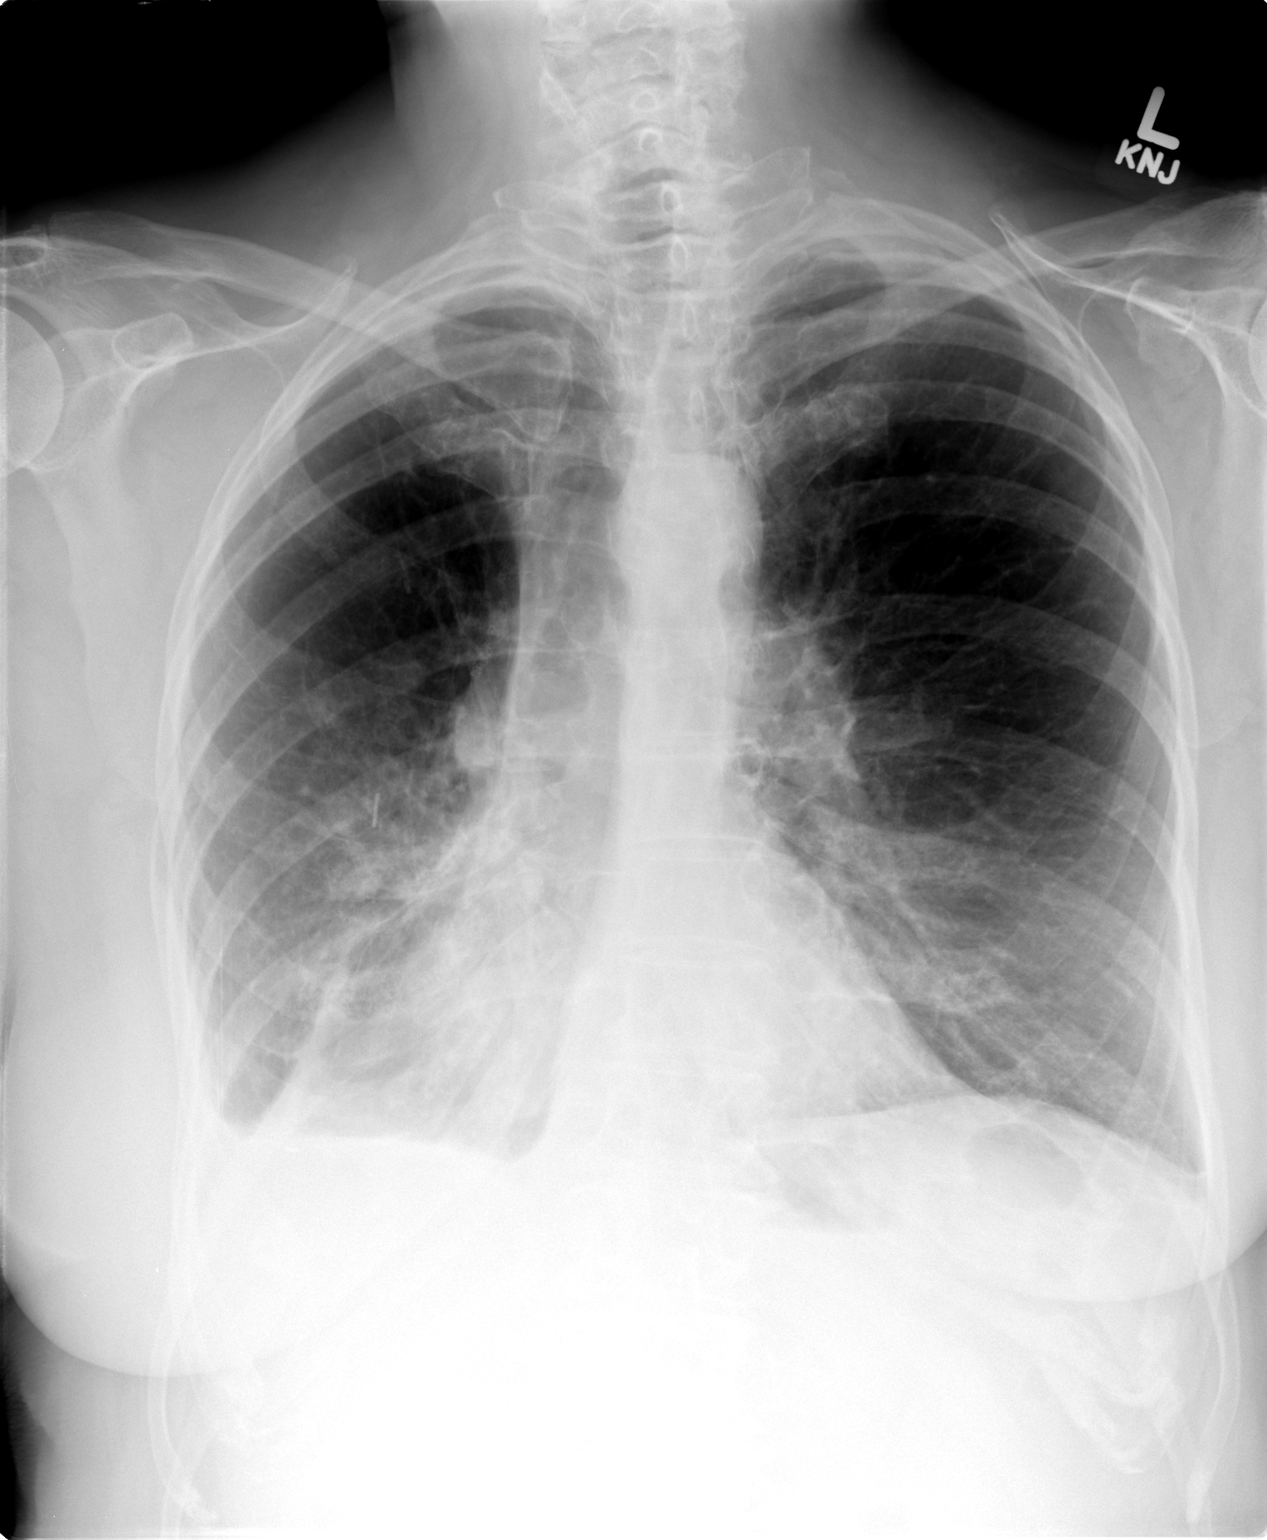

[view not recorded (2 of 2)]
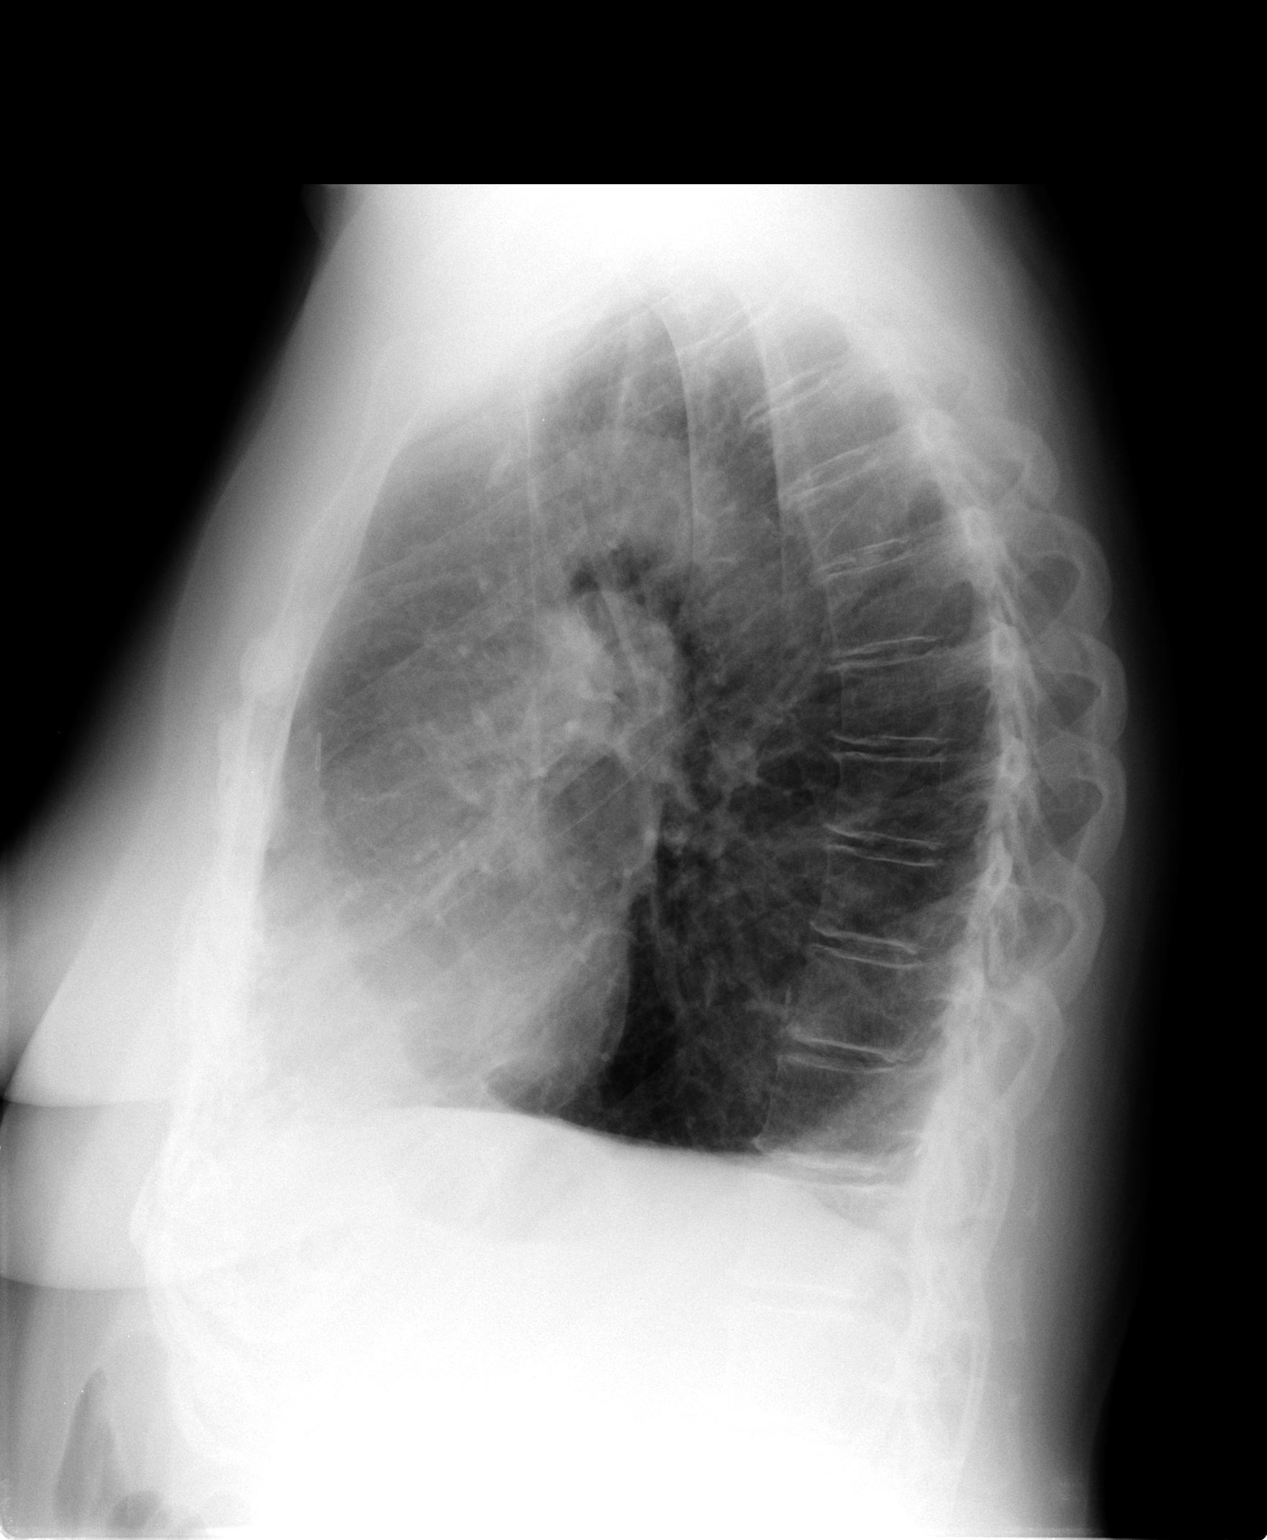

[2 of 2 positions shown; findings below may reference images not displayed]

FINDINGS: There are changes of COPD - stable.  There are scarring
changes noted within the right lung base - stable.  There is a
vague area of nodularity seen within the right mid to upper lung
zone which appears more prominent on today's chest x-ray.  Consider
chest CT scan for further evaluation of this.  The left lung is
unchanged.  There is no evidence for mediastinal or hilar
adenopathy.
IMPRESSION: Scarring changes within the right lung consistent with the
patient's previous right thoracotomy.  New vague area of nodularity
noted within the right mid to upper lung zone.  Consider chest CT
scan for further evaluation of this.

## 2010-12-04 IMAGING — CT CT CHEST W/ CM
2 of 4 series · 15 of 36 positions shown, 18 images · IV contrast (Omnipaque 300)
Comparison: Chest x-ray 05/06/2009 and CT chest 12/24/2008

CLINICAL DATA: Abnormal chest x-ray.  Lung cancer.  Cough, fever,
nausea and vomiting.  Hemoptysis and substernal pain.

CT CHEST WITH CONTRAST
TECHNIQUE: Multidetector CT imaging of the chest was performed
following the standard protocol during bolus administration of
intravenous contrast.
Contrast: 80 ml Mmnipaque-NGG

[Series 2: chest routine with · axial · 0.80mm/px · z∈[-327,-47]mm · 12 of 68 slices shown, 15 images]
[im 6/68  mediastinal]
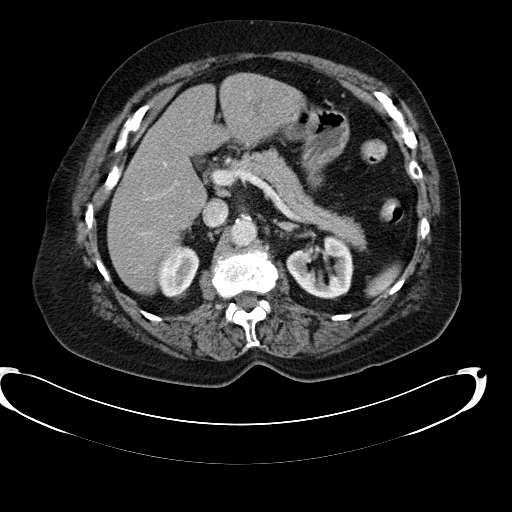
[im 6/68  lung]
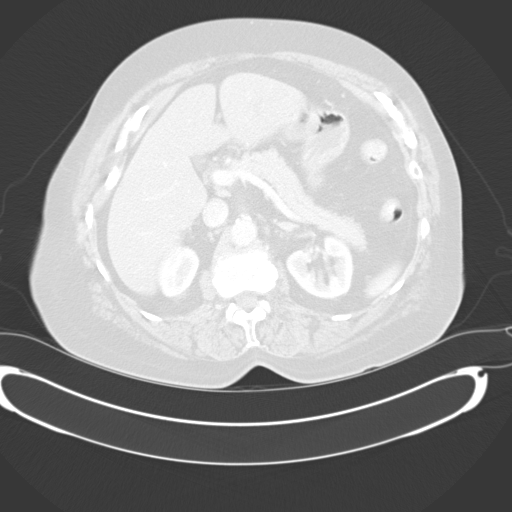
[im 11/68  lung]
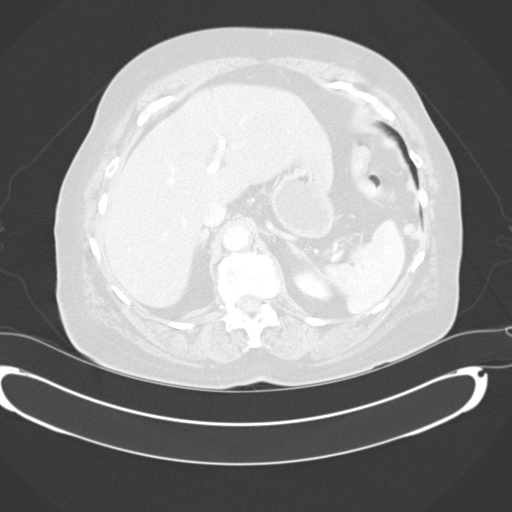
[im 16/68  lung]
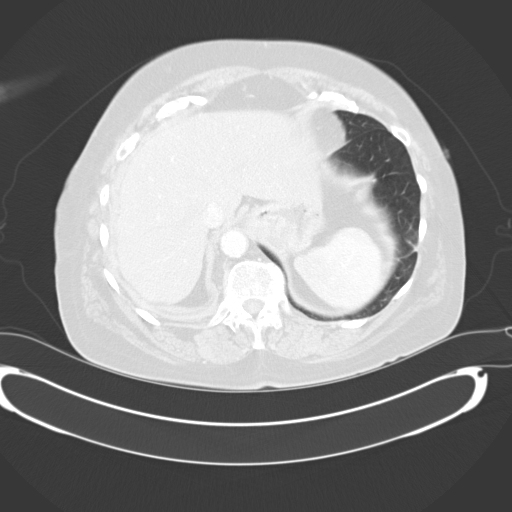
[im 21/68  lung]
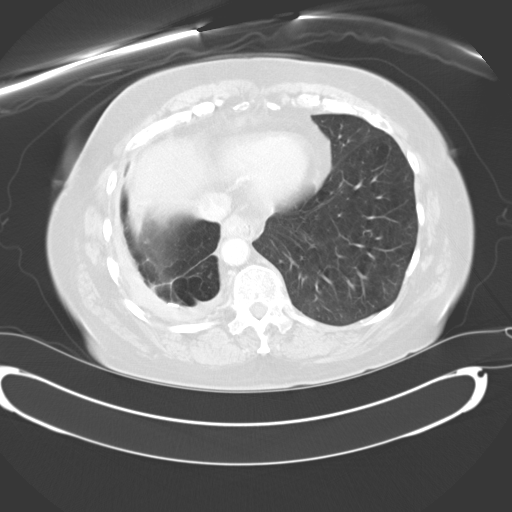
[im 26/68  mediastinal]
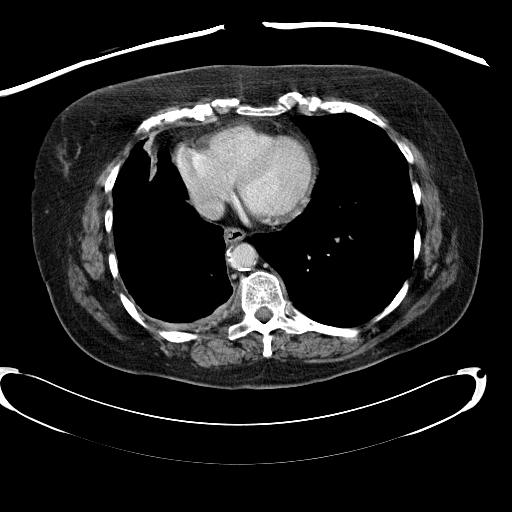
[im 26/68  lung]
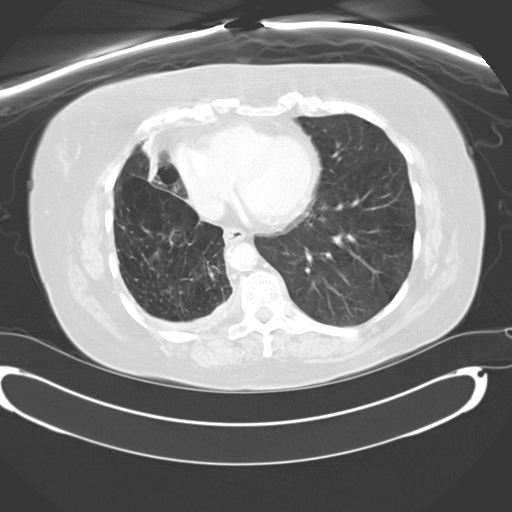
[im 31/68  lung]
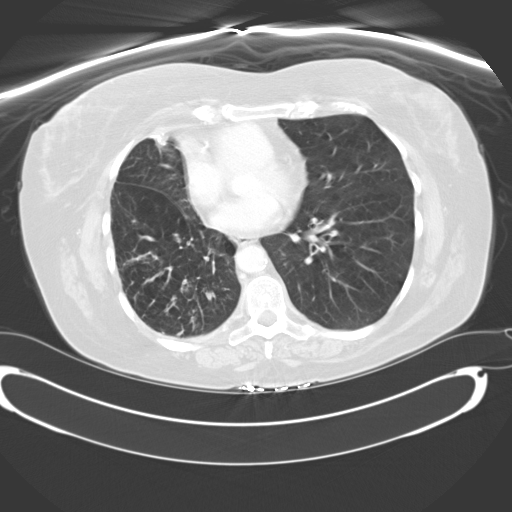
[im 37/68  lung]
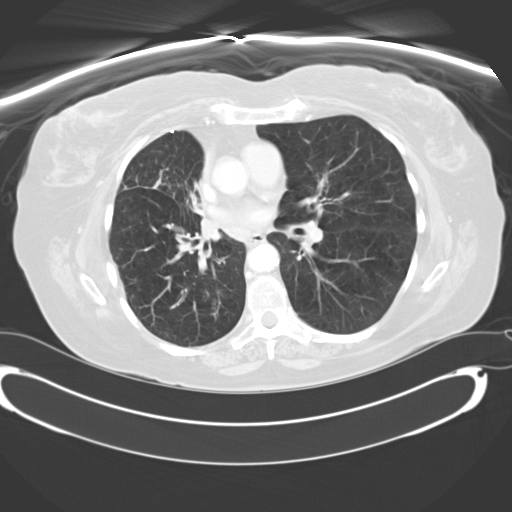
[im 42/68  lung]
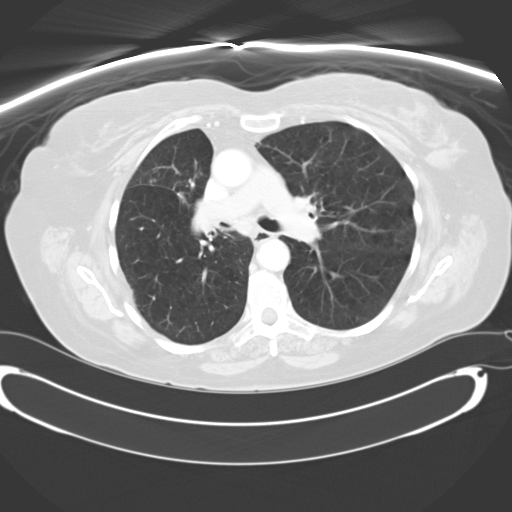
[im 47/68  mediastinal]
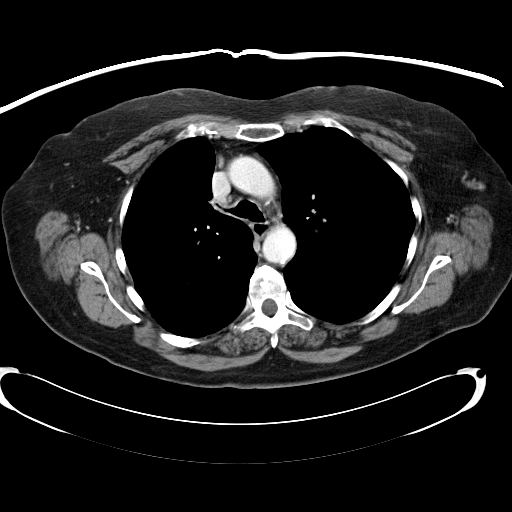
[im 47/68  lung]
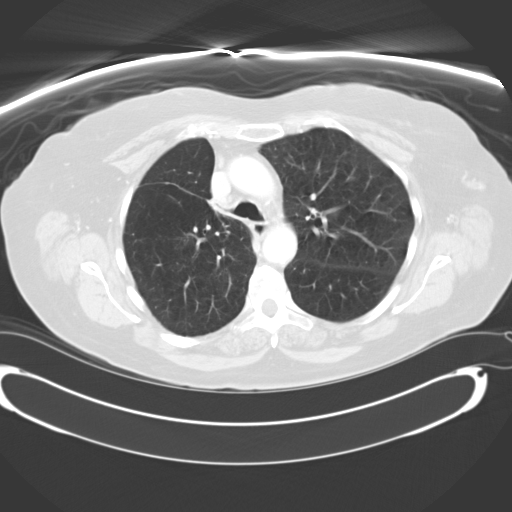
[im 52/68  lung]
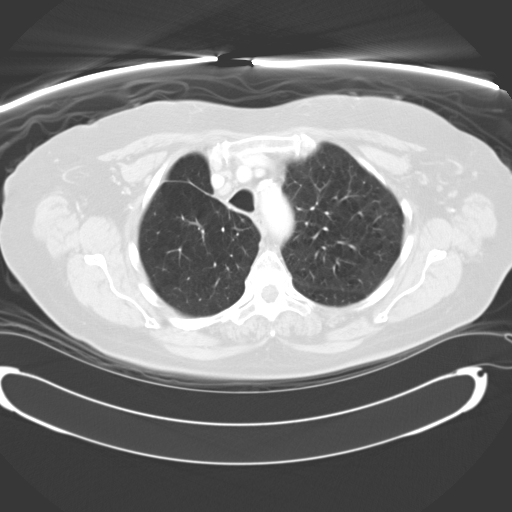
[im 57/68  lung]
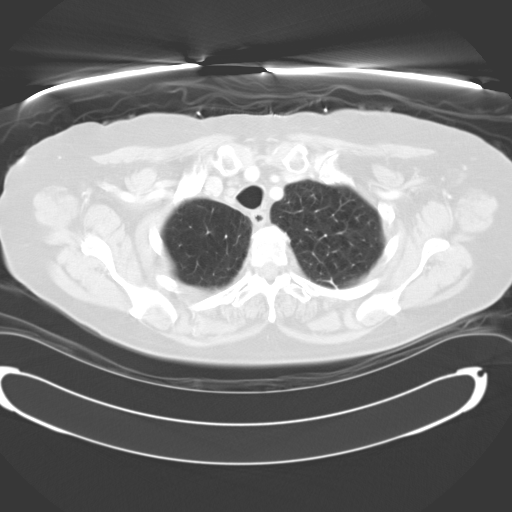
[im 62/68  lung]
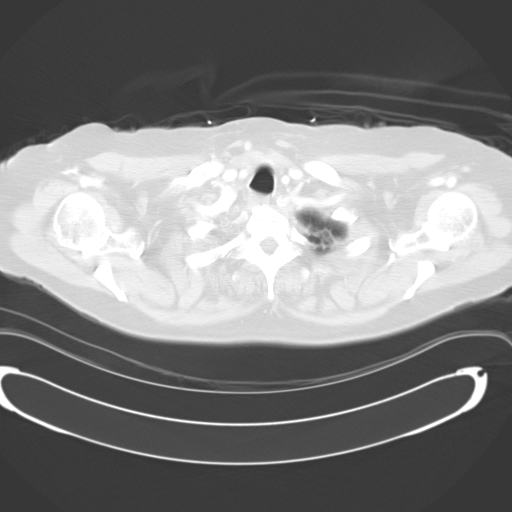

[Series 602: <mpr range> · coronal · 0.80mm/px · 3 of 133 slices shown]
[im 27/133  lung]
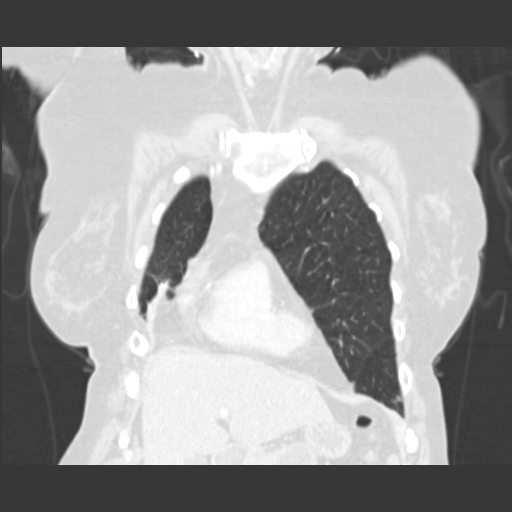
[im 53/133  lung]
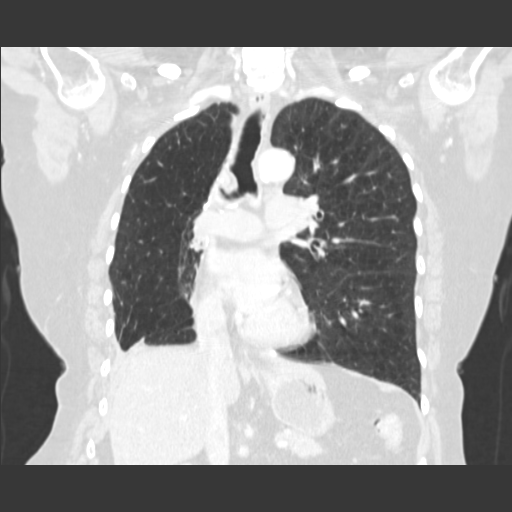
[im 80/133  lung]
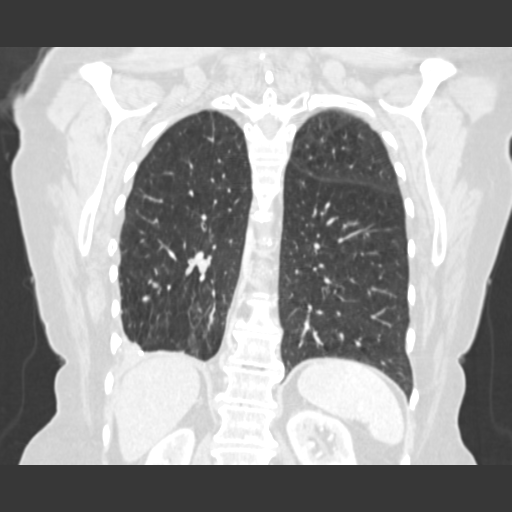

[15 of 36 positions shown; findings below may reference images not displayed]

FINDINGS: Subcarinal lymph node has increased in size, measuring 12
mm in short axis (previously 7 mm).  There may be developing right
hilar adenopathy, measuring approximately 8 mm.  Other mediastinal
lymph nodes are small in size.  No axillary adenopathy.  Heart size
normal.  No pericardial effusion.  Coronary artery calcification.
Small hiatal hernia.

Postop changes are seen in the right hemithorax.  Nodularity is
seen along the right major fissure, measuring up to 9 x 7 mm,
stable.  Pleural thickening is seen at the base of the right
hemithorax.  Lungs are emphysematous.  Bronchial wall thickening
and mild nodularity in the right lower lobe appear slightly
worsened than on 12/24/2008.  Cannot exclude an 8 mm nodule in the
right lower lobe (image 35).  No pleural fluid.

Incidental imaging of the upper abdomen shows a tiny low density
lesion in the periphery of the left hepatic lobe, stable.  No
worrisome lytic or sclerotic lesions.
IMPRESSION: 1.  Increase in size of subcarinal lymph node. Question developing
right hilar adenopathy.
2.  Postoperative changes in the right hemithorax with stable
nodularity along the right major fissure.
3.  Cannot exclude a small nodule in the right lower lobe.
Attention on follow-up exams is warranted.
4.  Increasing bronchial wall thickening and mild
peribronchovascular nodularity in the right lower lobe may be
infectious or inflammatory.

## 2010-12-07 ENCOUNTER — Ambulatory Visit (INDEPENDENT_AMBULATORY_CARE_PROVIDER_SITE_OTHER): Payer: Medicare Other | Admitting: Internal Medicine

## 2010-12-07 ENCOUNTER — Other Ambulatory Visit (INDEPENDENT_AMBULATORY_CARE_PROVIDER_SITE_OTHER): Payer: Medicare Other | Admitting: Internal Medicine

## 2010-12-07 ENCOUNTER — Encounter: Payer: Self-pay | Admitting: Internal Medicine

## 2010-12-07 ENCOUNTER — Other Ambulatory Visit: Payer: Medicare Other

## 2010-12-07 ENCOUNTER — Ambulatory Visit (INDEPENDENT_AMBULATORY_CARE_PROVIDER_SITE_OTHER)
Admission: RE | Admit: 2010-12-07 | Discharge: 2010-12-07 | Disposition: A | Discharge status: Home / Self Care | Payer: Medicare Other | Source: Ambulatory Visit | Attending: Internal Medicine | Admitting: Internal Medicine

## 2010-12-07 VITALS — BP 132/70 | HR 93 | Temp 97.4°F | Ht 67.0 in | Wt 173.8 lb

## 2010-12-07 DIAGNOSIS — J441 Chronic obstructive pulmonary disease with (acute) exacerbation: Secondary | ICD-10-CM

## 2010-12-07 DIAGNOSIS — R300 Dysuria: Secondary | ICD-10-CM

## 2010-12-07 DIAGNOSIS — Z0000 Encounter for general adult medical examination without abnormal findings: Secondary | ICD-10-CM

## 2010-12-07 LAB — URINALYSIS, ROUTINE W REFLEX MICROSCOPIC
Bilirubin Urine: NEGATIVE
Specific Gravity, Urine: 1.02 (ref 1.000–1.030)
Urobilinogen, UA: 0.2 (ref 0.0–1.0)
pH: 6 (ref 5.0–8.0)

## 2010-12-07 MED ORDER — METHYLPREDNISOLONE ACETATE 80 MG/ML IJ SUSP
120.0000 mg | Freq: Once | INTRAMUSCULAR | Status: AC
  Administered 2010-12-07: 120 mg via INTRAMUSCULAR

## 2010-12-07 MED ORDER — LEVOFLOXACIN 500 MG PO TABS: 500.0000 mg | ORAL_TABLET | Freq: Every day | ORAL | Status: AC

## 2010-12-07 NOTE — Progress Notes (Signed)
  Subjective:    Patient ID: [name], female    DOB: [birth year], 77 y.o.   MRN: [id number]  HPI  Multiple concerns complains of cough - change in nature 3 days ago - green compared to usual clear Onset 3 days ago No fever or CP but increase in dyspnea on exertion and wheeze Reports compliance with COPd meds and rx'd -   Also frequent urination with "bubbles" No hematuria or dysuria No flank pain, similar symptoms with prior UTI  Past Medical History  Diagnosis Date  . Cancer   . Allergic rhinitis   . COPD (chronic obstructive pulmonary disease)   . BC (bronchogenic carcinoma)     Resected RUL  . Lung nodule   . Pelvic cyst   . Bladder atony   . Unspecified hypothyroidism   . Hyperlipidemia     Review of Systems  Constitutional: Positive for fatigue. Negative for fever.  Respiratory: Negative for shortness of breath.   Cardiovascular: Negative for palpitations.       Objective:   Physical Exam BP 132/70  Pulse 93  Temp(Src) 97.4 F (36.3 C) (Oral)  Ht 5\' 7"  (1.702 m)  Wt 173 lb 12.8 oz (78.835 kg)  BMI 27.22 kg/m2  SpO2 98% Physical Exam  Constitutional: She is oriented to person, place, and time. She appears well-developed and well-nourished. No distress.  dtr at side HENT: Head: Normocephalic and atraumatic. Ears; B TMs ok, no erythema or effusion; Nose: Nose normal.  Mouth/Throat: Oropharynx is clear and moist. No oropharyngeal exudate.   Cardiovascular: Normal rate, regular rhythm and normal heart sounds.  No murmur heard. No BLE edema. Pulmonary/Chest: Effort normal and breath sounds rhonchorous R>L base. No respiratory distress. She has exp wheezes. Psychiatric: She has a normal mood and affect. Her behavior is normal. Judgment and thought content normal.   Lab Results  Component Value Date   WBC 7.2 08/25/2010   HGB 14.4 08/25/2010   HCT 42.2 08/25/2010   PLT 223.0 08/25/2010   CHOL 245* 08/25/2010   TRIG 180.0* 08/25/2010   HDL 73.60 08/25/2010   LDLDIRECT 153.5 08/25/2010   ALT 31 08/25/2010   AST 66* 08/25/2010   NA 140 08/25/2010   K 4.4 08/25/2010   CL 99 08/25/2010   CREATININE 0.9 08/25/2010   BUN 8 08/25/2010   CO2 32 08/25/2010   TSH 2.09 08/25/2010   INR 0.94 02/05/2010   HGBA1C 5.3 05/06/2010       Last CXR 1/12 - report reviewed, chronic bronch   Assessment & Plan:  Acute COPD exac with chronic bronchitis - check CXR now and add empiric Levaquin - steroid shot for bronchospam - cont home COPD meds and follow up pulm/onc as planned  Urinary symptoms - check UA - abx as rx'd for pulm infx would cover UTI if present - advised hydration

## 2010-12-07 NOTE — Patient Instructions (Addendum)
It was good to see you today. Test(s) ordered today. Your results will be called to you after review (48-72hours after test completion). If any changes need to be made, you will be notified at that time. Levaquin for infection and steroid shot given today - Your prescription(s) have been submitted to your pharmacy. Please take as directed and contact our office if you believe you are having problem(s) with the medication(s). Continue home medications + oxygen as ongoing Please schedule followup in 6-8 weeks with Dr. Yetta Barre to review, call sooner if problems.

## 2010-12-18 IMAGING — CR DG CHEST 2V
2 series · 2 of 2 positions shown · non-contrast
Comparison: 05/06/2009

CLINICAL DATA: Pneumonia, lung cancer status post resection,
emphysema, 486,

CHEST - 2 VIEW

[view not recorded (1 of 2)]
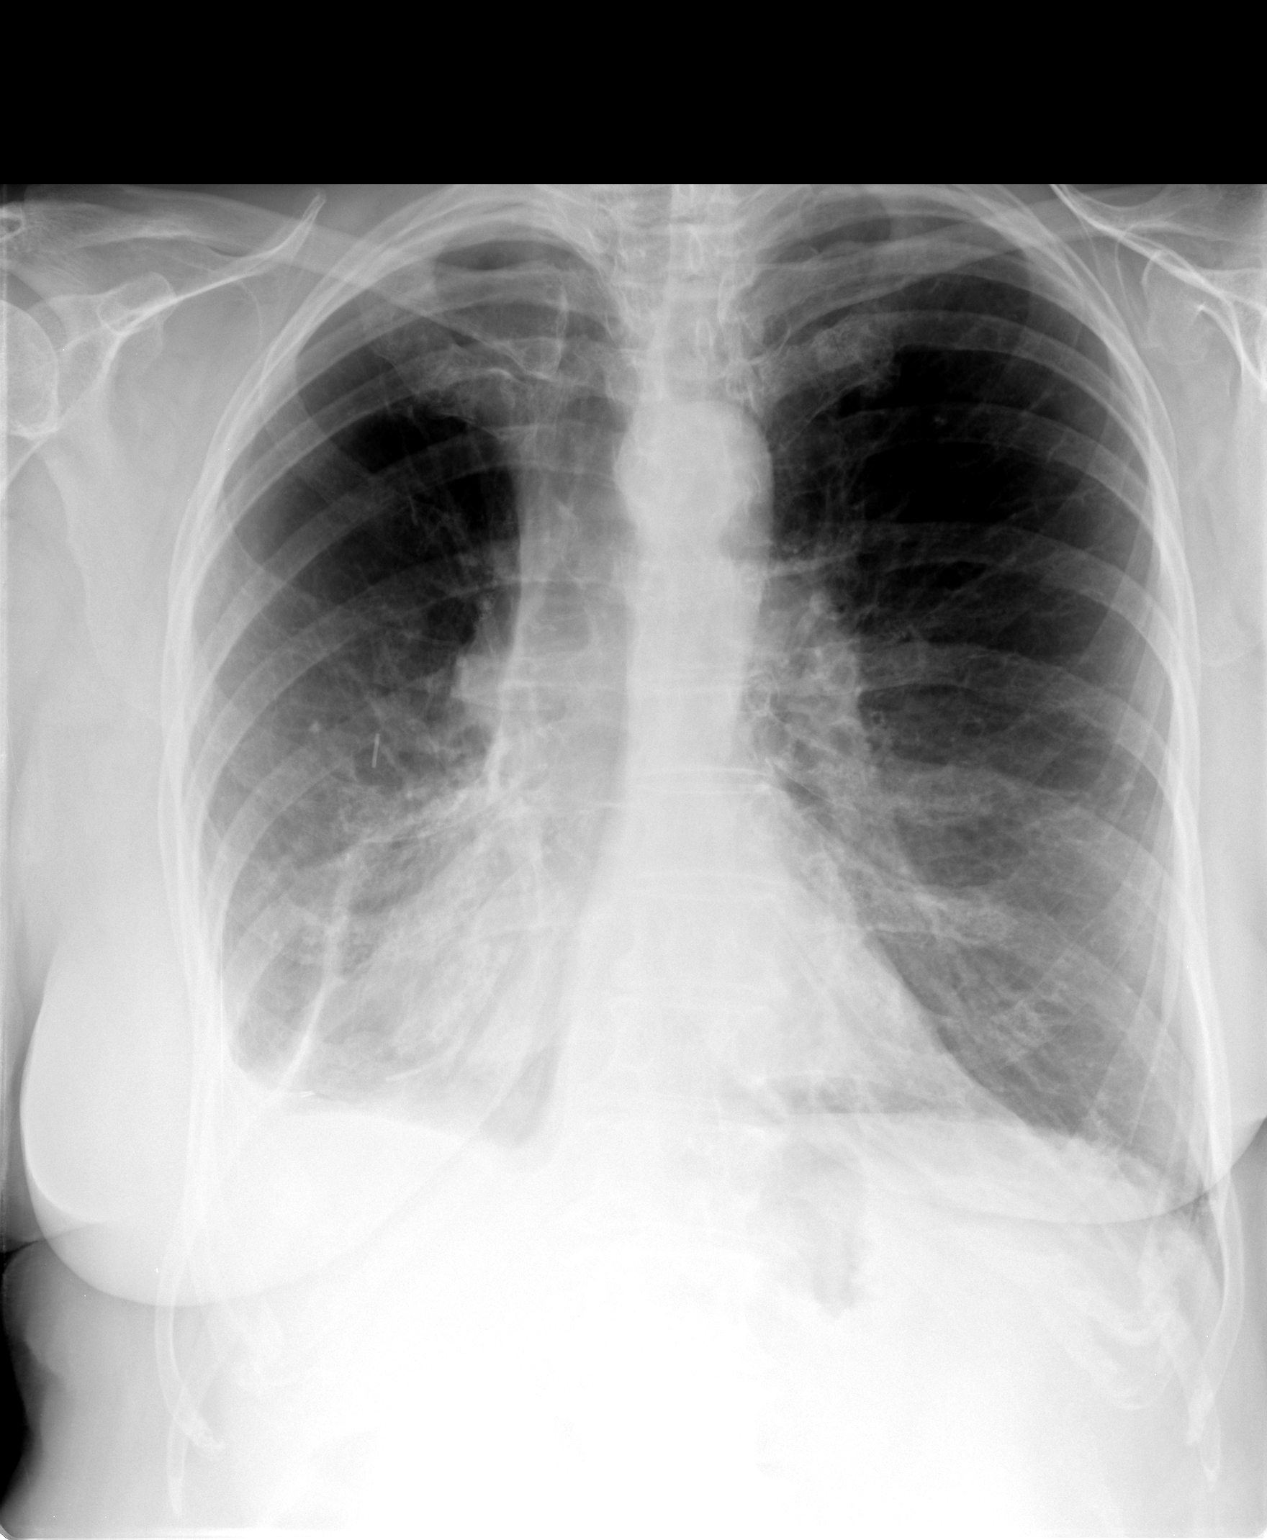

[view not recorded (2 of 2)]
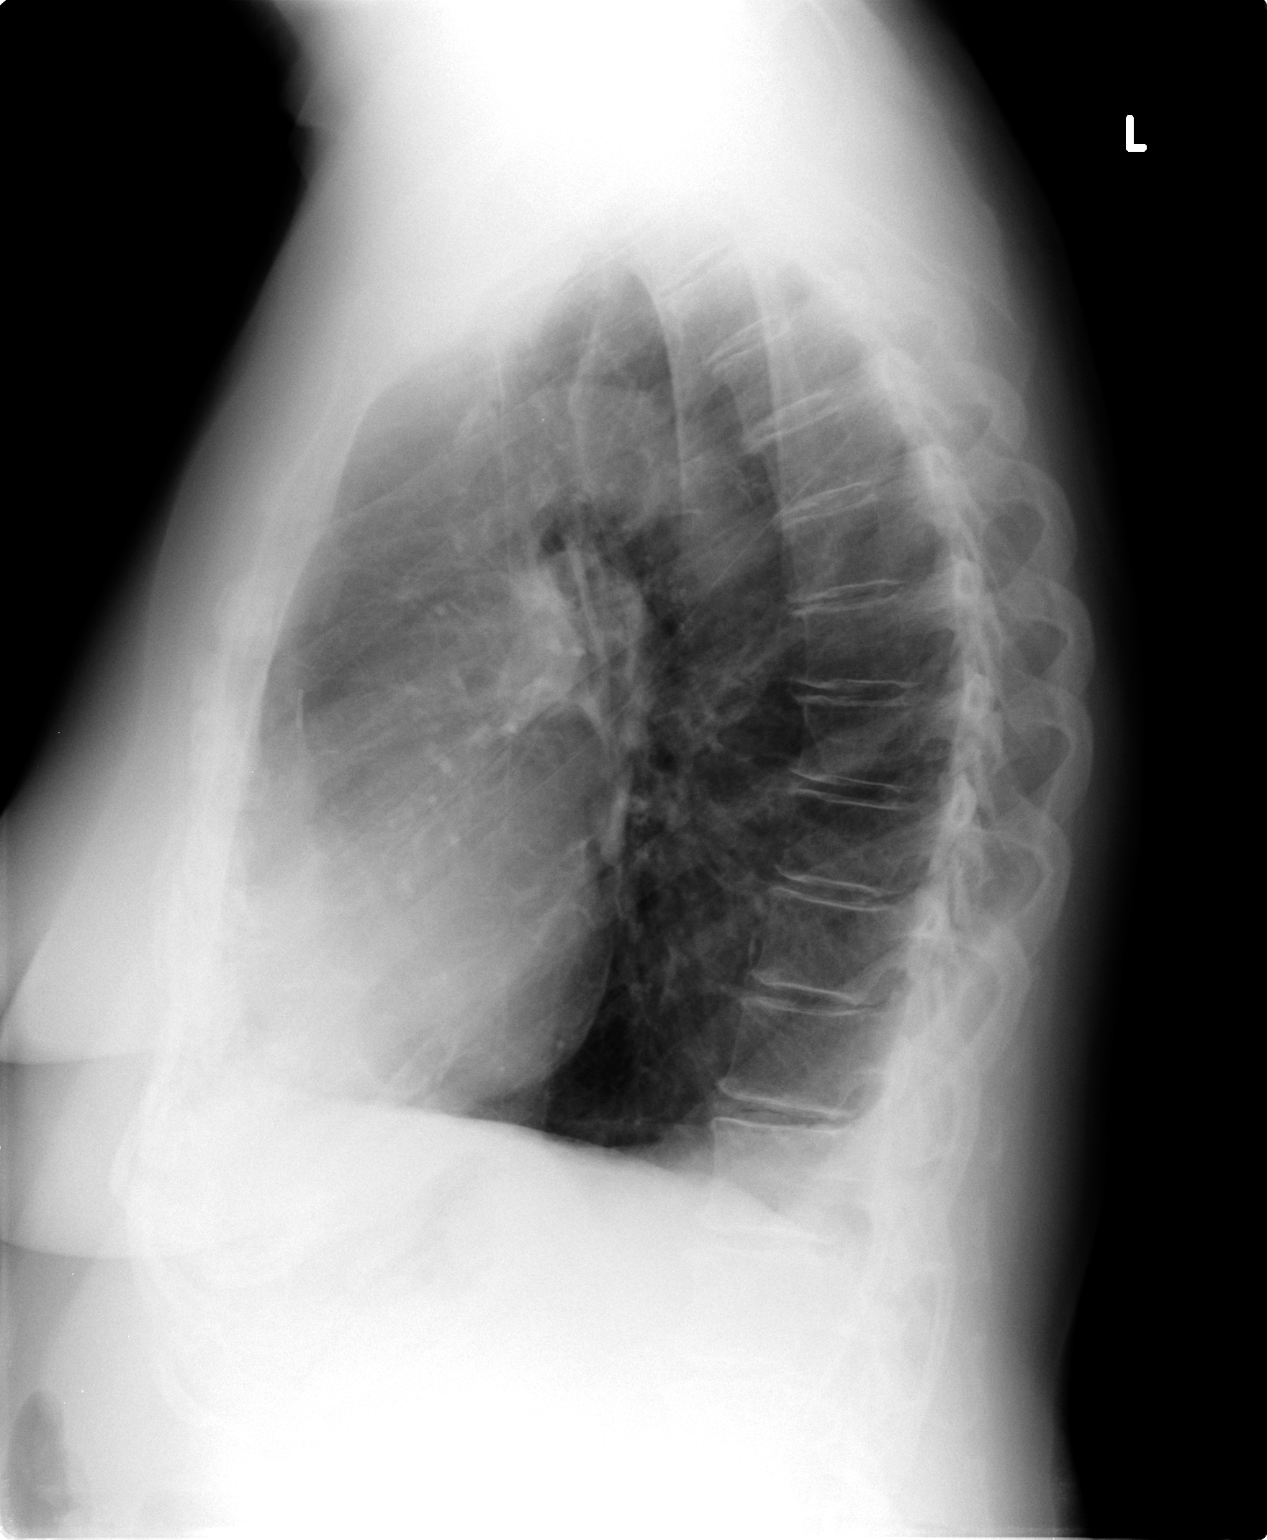

[2 of 2 positions shown; findings below may reference images not displayed]

FINDINGS: Borderline cardiac enlargement.
Postsurgical changes lower right chest with volume loss and slight
mediastinal shift to the right.
Associated pleural thickening and/or fluid right base stable.
Mediastinal contours and pulmonary vascularity otherwise normal.
Underlying COPD.
No definite acute infiltrate or pleural effusion.
Bones appear diffusely demineralized.
IMPRESSION: Minimal cardiac enlargement.
COPD with stable postsurgical changes lower right hemithorax.
No acute abnormalities.

## 2010-12-21 ENCOUNTER — Encounter (HOSPITAL_BASED_OUTPATIENT_CLINIC_OR_DEPARTMENT_OTHER): Payer: Medicare Other | Admitting: Oncology

## 2010-12-21 ENCOUNTER — Other Ambulatory Visit: Payer: Self-pay | Admitting: Oncology

## 2010-12-21 ENCOUNTER — Ambulatory Visit (HOSPITAL_COMMUNITY)
Admission: RE | Admit: 2010-12-21 | Discharge: 2010-12-21 | Disposition: A | Discharge status: Home / Self Care | Payer: Medicare Other | Source: Ambulatory Visit | Attending: Oncology | Admitting: Oncology

## 2010-12-21 ENCOUNTER — Encounter (HOSPITAL_COMMUNITY): Payer: Self-pay

## 2010-12-21 DIAGNOSIS — Z79899 Other long term (current) drug therapy: Secondary | ICD-10-CM | POA: Insufficient documentation

## 2010-12-21 DIAGNOSIS — M47817 Spondylosis without myelopathy or radiculopathy, lumbosacral region: Secondary | ICD-10-CM | POA: Insufficient documentation

## 2010-12-21 DIAGNOSIS — Z7982 Long term (current) use of aspirin: Secondary | ICD-10-CM | POA: Insufficient documentation

## 2010-12-21 DIAGNOSIS — M47814 Spondylosis without myelopathy or radiculopathy, thoracic region: Secondary | ICD-10-CM | POA: Insufficient documentation

## 2010-12-21 DIAGNOSIS — R059 Cough, unspecified: Secondary | ICD-10-CM | POA: Insufficient documentation

## 2010-12-21 DIAGNOSIS — Z85118 Personal history of other malignant neoplasm of bronchus and lung: Secondary | ICD-10-CM

## 2010-12-21 DIAGNOSIS — C3490 Malignant neoplasm of unspecified part of unspecified bronchus or lung: Secondary | ICD-10-CM | POA: Insufficient documentation

## 2010-12-21 DIAGNOSIS — Z902 Acquired absence of lung [part of]: Secondary | ICD-10-CM | POA: Insufficient documentation

## 2010-12-21 DIAGNOSIS — R05 Cough: Secondary | ICD-10-CM | POA: Insufficient documentation

## 2010-12-21 DIAGNOSIS — J438 Other emphysema: Secondary | ICD-10-CM | POA: Insufficient documentation

## 2010-12-21 DIAGNOSIS — R0789 Other chest pain: Secondary | ICD-10-CM | POA: Insufficient documentation

## 2010-12-21 LAB — CMP (CANCER CENTER ONLY)
ALT(SGPT): 22 U/L (ref 10–47)
AST: 34 U/L (ref 11–38)
Albumin: 3.6 g/dL (ref 3.3–5.5)
Alkaline Phosphatase: 74 U/L (ref 26–84)
Potassium: 4 mEq/L (ref 3.3–4.7)
Sodium: 143 mEq/L (ref 128–145)
Total Protein: 7.5 g/dL (ref 6.4–8.1)

## 2010-12-21 LAB — CBC WITH DIFFERENTIAL/PLATELET
EOS%: 0.6 % (ref 0.0–7.0)
MCH: 34.5 pg — ABNORMAL HIGH (ref 25.1–34.0)
MCV: 100.9 fL (ref 79.5–101.0)
MONO%: 9.3 % (ref 0.0–14.0)
NEUT#: 4.1 10*3/uL (ref 1.5–6.5)
RBC: 3.68 10*6/uL — ABNORMAL LOW (ref 3.70–5.45)
RDW: 14.3 % (ref 11.2–14.5)

## 2010-12-28 ENCOUNTER — Encounter (HOSPITAL_BASED_OUTPATIENT_CLINIC_OR_DEPARTMENT_OTHER): Payer: Medicare Other | Admitting: Oncology

## 2010-12-28 DIAGNOSIS — J449 Chronic obstructive pulmonary disease, unspecified: Secondary | ICD-10-CM

## 2010-12-28 DIAGNOSIS — Z85118 Personal history of other malignant neoplasm of bronchus and lung: Secondary | ICD-10-CM

## 2010-12-28 DIAGNOSIS — E039 Hypothyroidism, unspecified: Secondary | ICD-10-CM

## 2010-12-28 IMAGING — CT NM PET TUM IMG RESTAG (PS) SKULL BASE T - THIGH
6 series · 25 of 25 positions shown · IV contrast ([ID])
Comparison: PET CT 09/18/2007, chest CT 05/11/2009

CLINICAL DATA: Subsequent treatment strategy for lung cancer,
enlarging mediastinal lymph nodes.

NUCLEAR MEDICINE PET CT RESTAGING (PS) SKULL BASE TO THIGH
TECHNIQUE: 17.6 mCi F-18 FDG was injected intravenously via the
right antecubital IV site.  Full-ring PET imaging was performed
from the skull base through the mid-thighs 57  minutes after
injection.  CT data was obtained and used for attenuation
correction and anatomic localization only.  (This was not acquired
as a diagnostic CT examination.)
Fasting Blood Glucose:  98

[Series 1: pet ac · axial · 3.3mm · 4.69mm/px · z∈[-878,-8]mm · 5 of 267 slices shown]
[im 1/267]
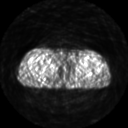
[im 67/267]
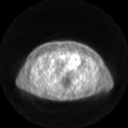
[im 134/267]
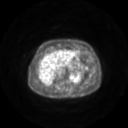
[im 200/267]
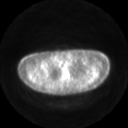
[im 267/267]
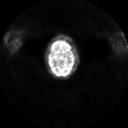

[Series 2: ct images · axial · 3.8mm · 0.98mm/px · z∈[-878,-8]mm · 6 of 267 slices shown]
[im 1/267]
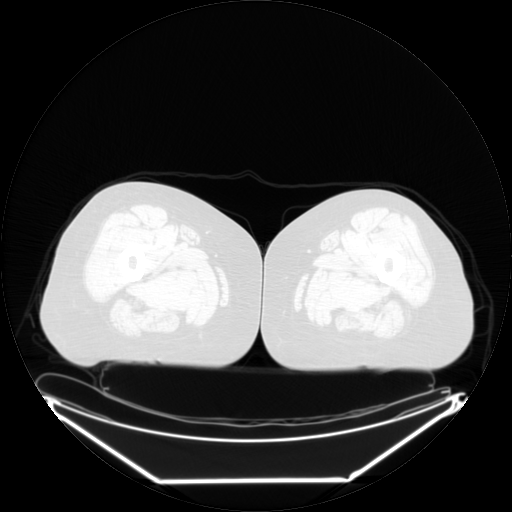
[im 54/267]
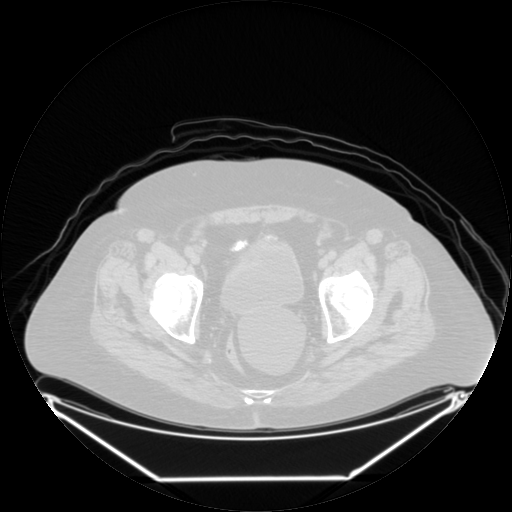
[im 107/267]
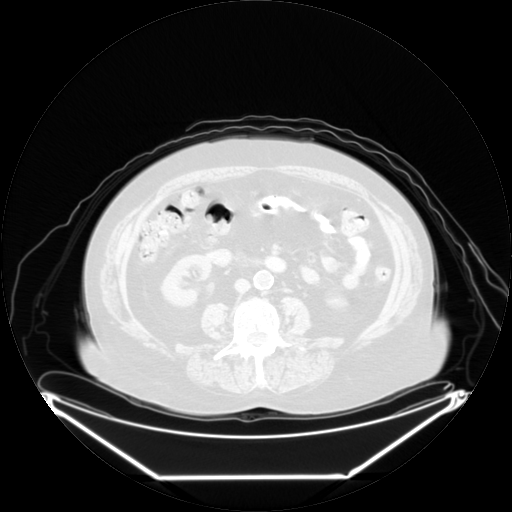
[im 160/267]
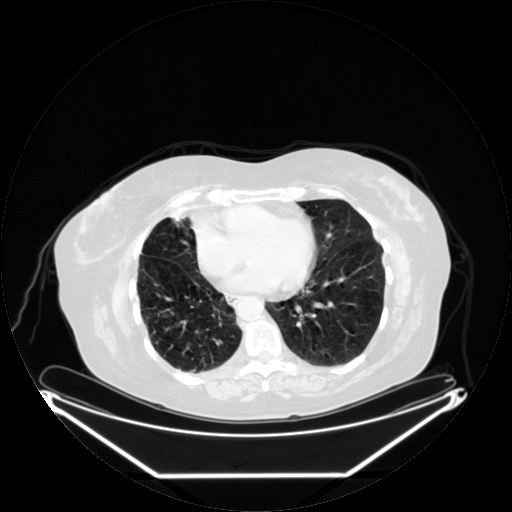
[im 213/267]
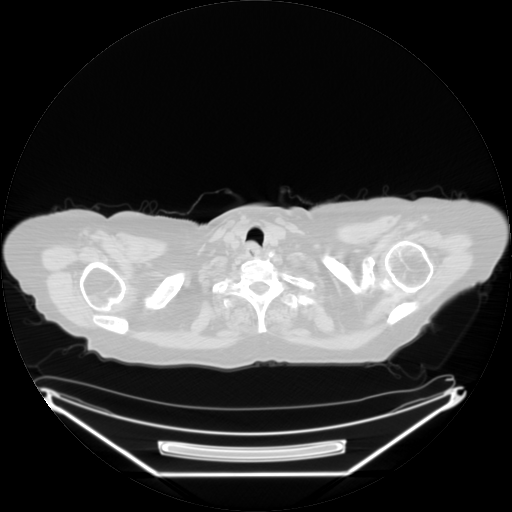
[im 267/267  brain]
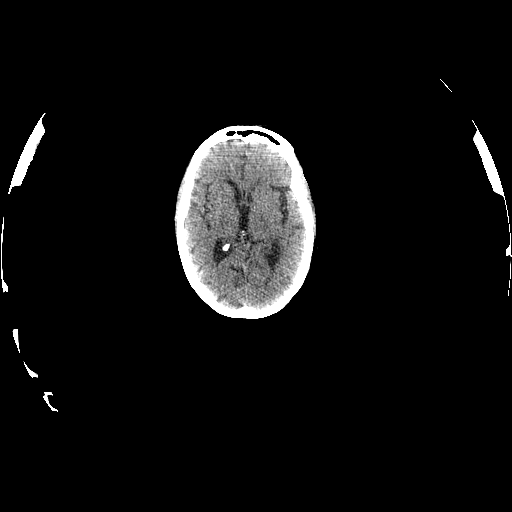

[Series 2: pet nac · axial · 3.3mm · 4.69mm/px · z∈[-878,-8]mm · 6 of 267 slices shown]
[im 1/267]
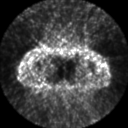
[im 54/267]
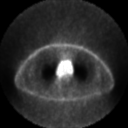
[im 107/267]
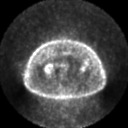
[im 160/267]
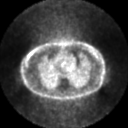
[im 213/267]
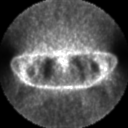
[im 267/267]
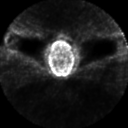

[Series 123: mip · coronal · 3.3mm · 4.69mm/px · 1 of 30 slices shown]
[im 1/30]
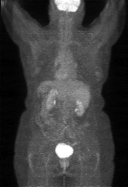

[Series 151: reformatted · axial · 3.3mm · 3.91mm/px · z∈[-878,-8]mm · 6 of 265 slices shown (1 of 2)]
[im 1/265]
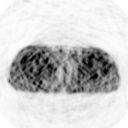
[im 53/265]
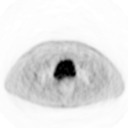
[im 106/265]
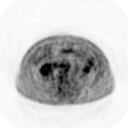
[im 159/265]
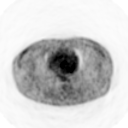
[im 212/265]
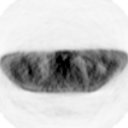
[im 265/265]
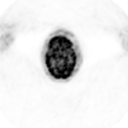

[Series 153: reformatted · coronal · 4.7mm · 6.98mm/px · 1 of 68 slices shown (2 of 2)]
[im 1/68]
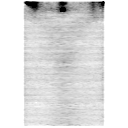

[25 of 25 positions shown; findings below may reference images not displayed]

FINDINGS: There is minimal focal increased FDG uptake in the right
upper hemithorax, likely corresponding to an adjacent pulmonary
nodule, when allowing for probable misregistration artifact from
patient motion, S U V maximum 1.3.  No other area of definite focal
abnormal F D G uptake is noted in the neck, chest, abdomen, or
pelvis.

Physiologic F D G uptake noted at the base of brain, nondilated
renal collecting systems, bowel, and myocardium.

Review of CT images obtained for attenuation correction purposes
demonstrates stable right pleural thickening and evidence of prior
right upper lobectomy.  Diffuse emphysematous changes are noted.
8.1 x 4.9 cm cystic pelvic mass abutting the posterior aspect of
the vaginal wall/left adnexa again noted, increased since the prior
study.
IMPRESSION: Minimal focal F D G uptake in the right upper hemithorax likely
corresponding to a nodule seen in this area as on prior studies,
allowing for motion/misregistration artifact.  This is nonspecific,
especially given the small size of this nodule, but the change is
concerning for possible malignancy.  No other area of abnormal F D
G uptake is noted.

Increase in size of left pelvic cystic lesion.  Due to its size,
surgical consultation is now recommended.

## 2011-01-03 ENCOUNTER — Telehealth: Payer: Self-pay

## 2011-01-03 MED ORDER — ALPRAZOLAM 0.5 MG PO TABS: 1.0000 mg | ORAL_TABLET | ORAL | Status: DC | PRN

## 2011-01-03 NOTE — Telephone Encounter (Signed)
yes

## 2011-01-03 NOTE — Telephone Encounter (Signed)
Received fax request from [facility] is this ok to refill? Alprazolam 0.5mg  bid 90 day supply

## 2011-01-03 NOTE — Telephone Encounter (Signed)
Refill approved and fax

## 2011-01-04 IMAGING — US US PELVIS COMPLETE MODIFY
1 series · 14 of 25 positions shown · non-contrast
Comparison: Pet CT scan 06/04/2009.

CLINICAL DATA: Cystic pelvic mass noted on PET CT scan.

ULTRASOUND PELVIS COMPLETE - MODIFY,TRANSVAGINAL ULTRASOUND OF
PELVIS
TECHNIQUE: Routine

[Series 1: us pelvis complete modify · 0.38mm/px · 14 of 34 slices shown]
[im 1/34]
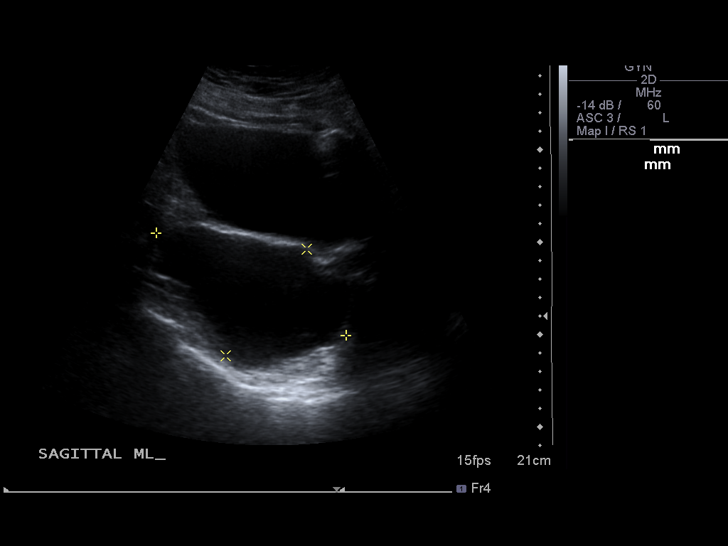
[im 3/34]
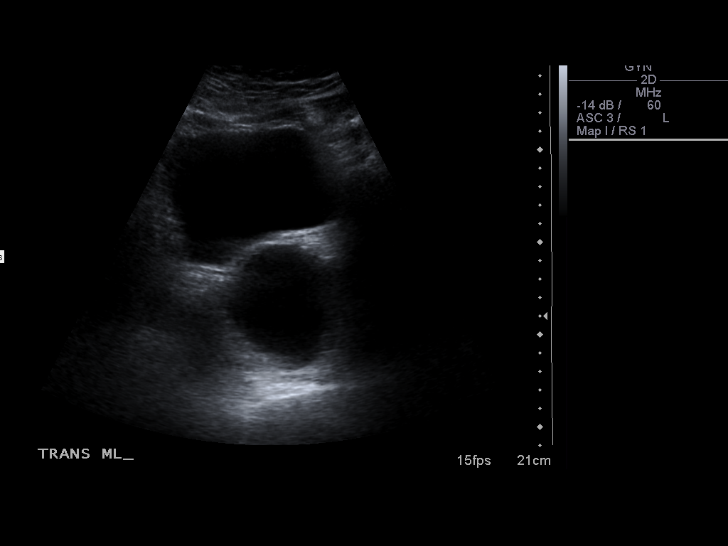
[im 6/34]
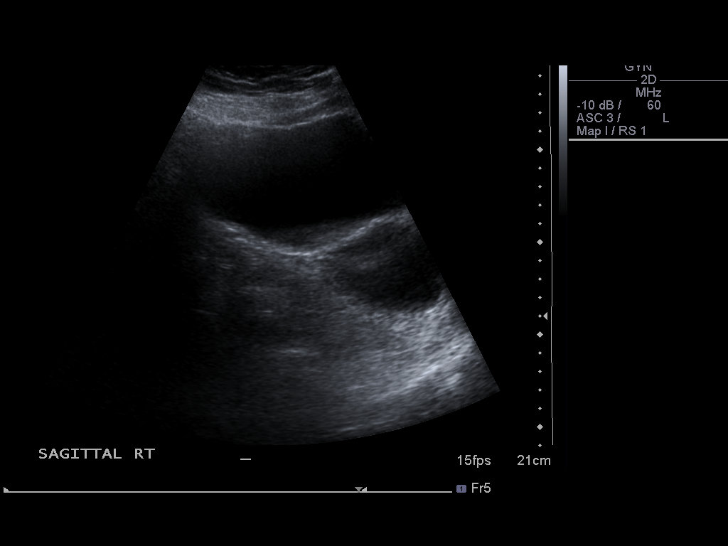
[im 9/34]
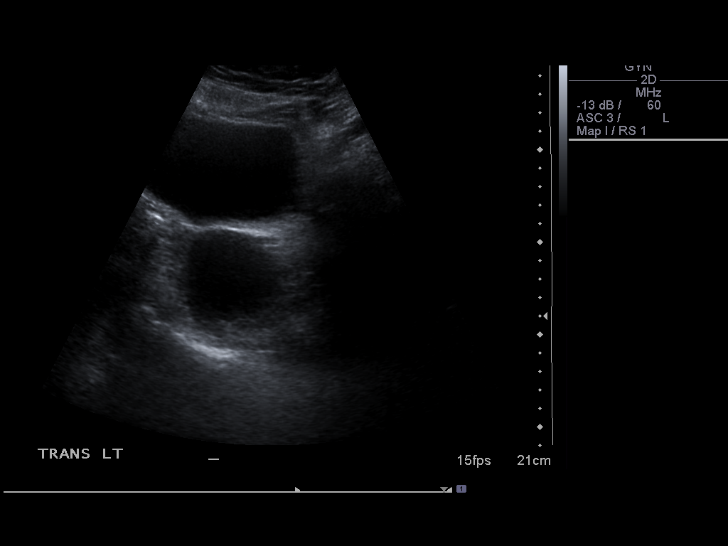
[im 12/34]
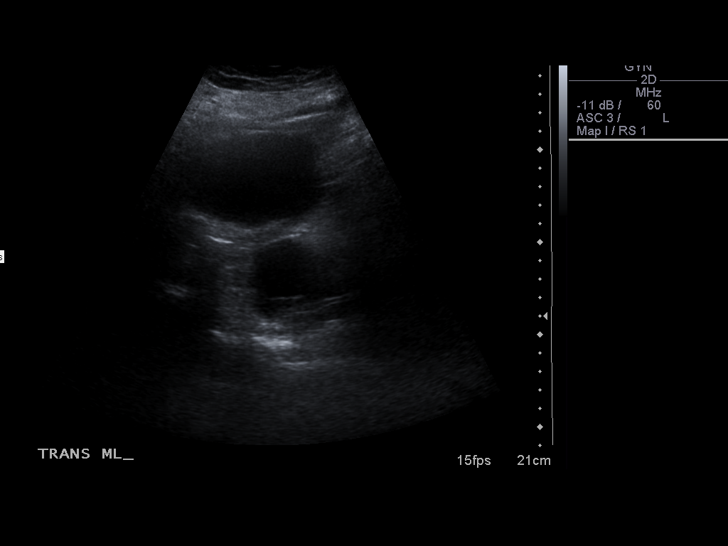
[im 13/34]
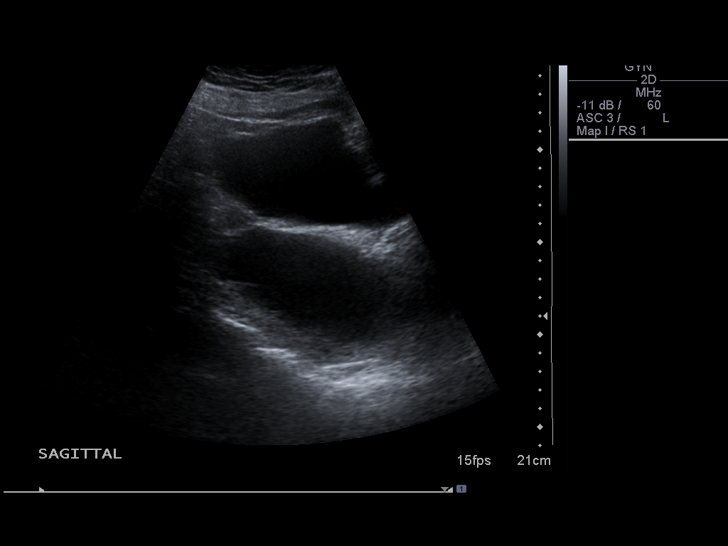
[im 16/34]
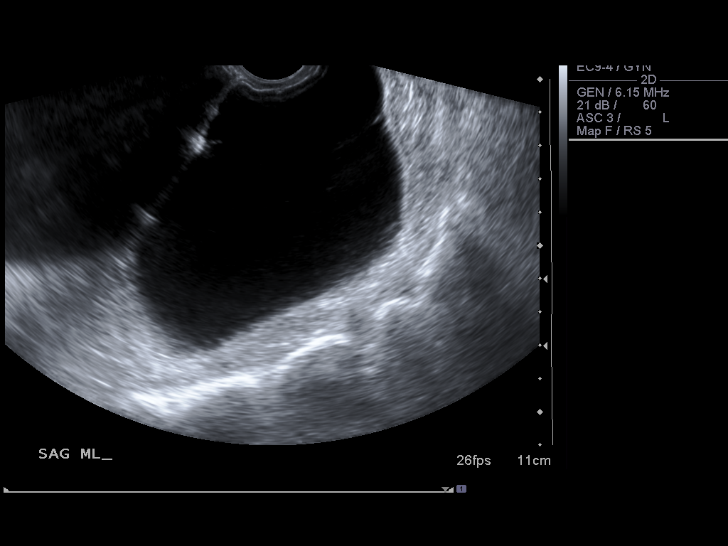
[im 18/34]
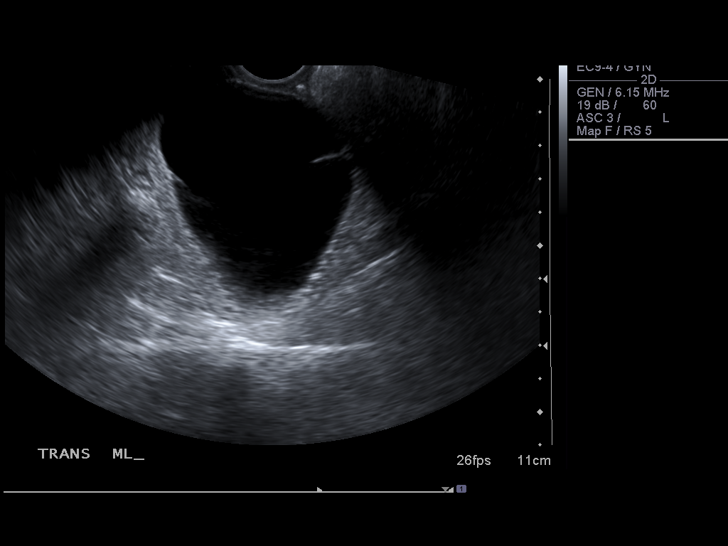
[im 21/34]
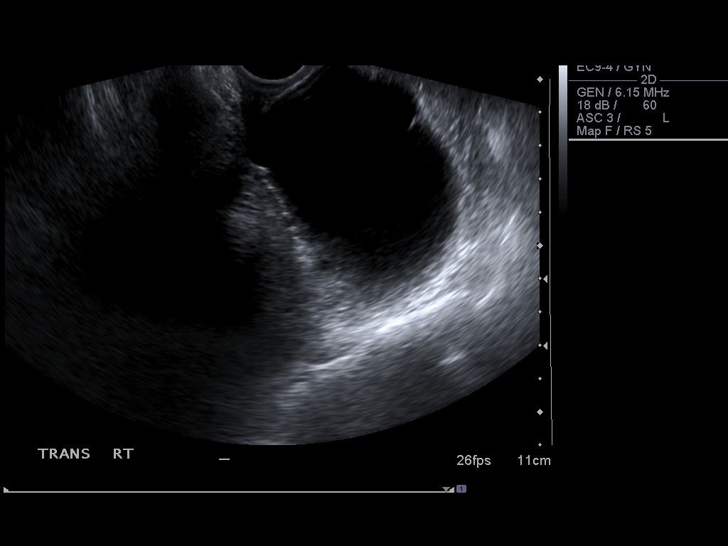
[im 23/34]
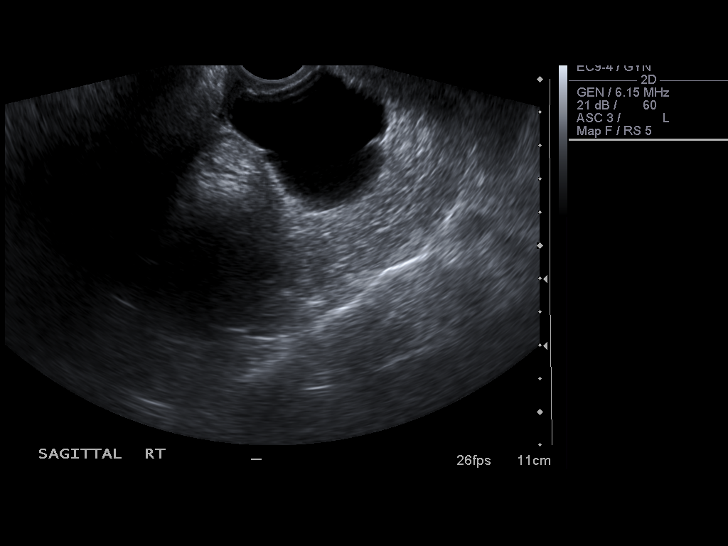
[im 25/34]
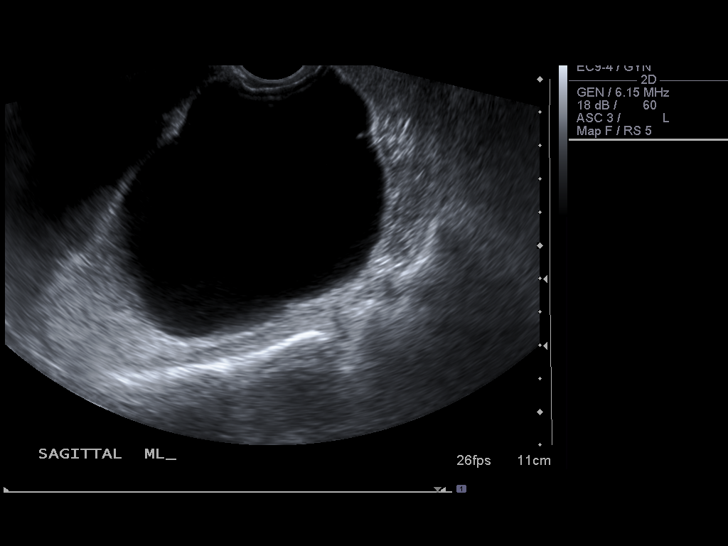
[im 28/34]
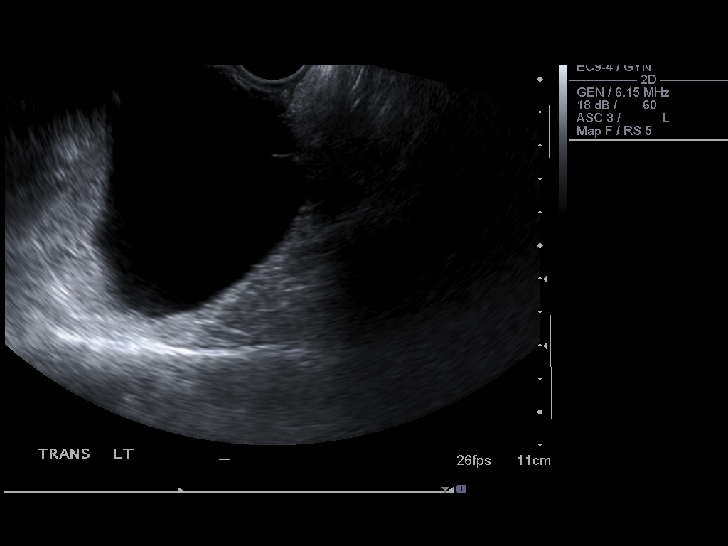
[im 31/34]
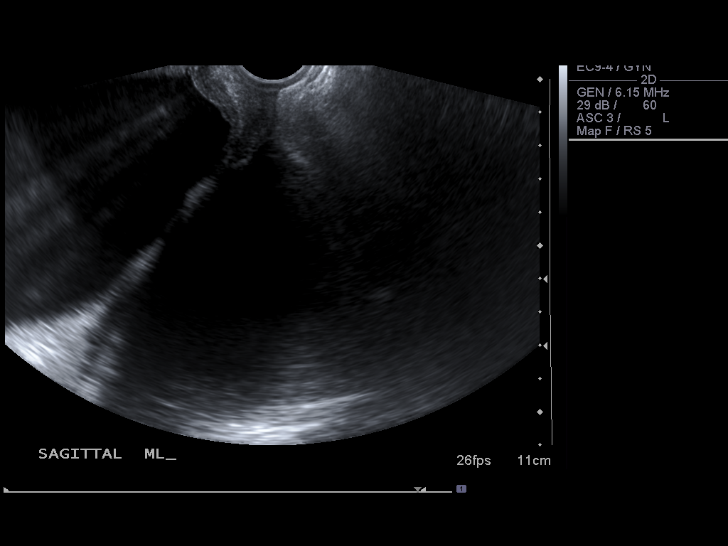
[im 34/34]
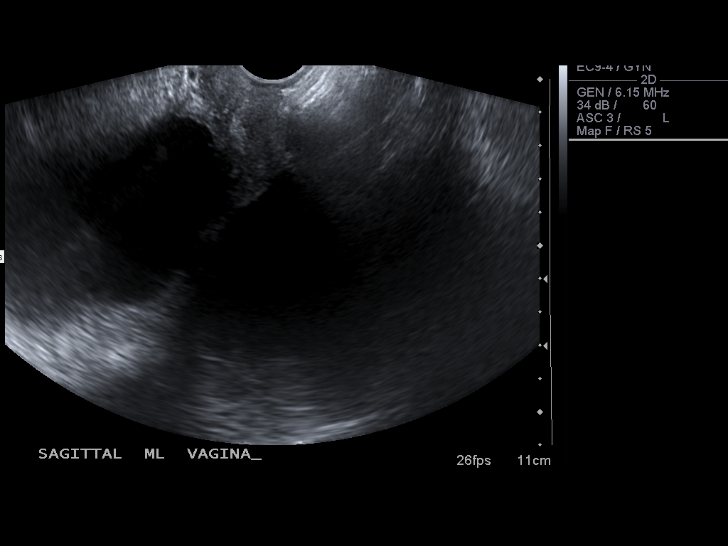

[14 of 25 positions shown; findings below may reference images not displayed]

FINDINGS: Status post hysterectomy.  Status post right ovarian
resection.

In the left adnexa, there is a large cyst measuring 10.2 x 6.8 x
6.0 cm.  It has several septations but otherwise appears to be a
simple cyst with no mural nodules, layering debris, or wall
hypervascularity.  No definite left ovarian tissue is
sonographically evident. If clinically warranted, this lesion could
be percutaneously aspirated from a transgluteal approach.
IMPRESSION: 1.  Status post hysterectomy and right nephrectomy.
3.  There is a large cystic lesion in the left adnexa as described
above.

## 2011-01-07 ENCOUNTER — Telehealth: Payer: Self-pay

## 2011-01-07 NOTE — Telephone Encounter (Signed)
Received call from [facility] verifing xanax refills. I advised that this was approved on 7/16 180# and 3 rf. Per pharmacist this can only be refilled x 1. I advised that system would be updated

## 2011-01-14 ENCOUNTER — Encounter: Payer: Self-pay | Admitting: Internal Medicine

## 2011-01-14 ENCOUNTER — Other Ambulatory Visit: Payer: Self-pay

## 2011-01-14 ENCOUNTER — Ambulatory Visit (INDEPENDENT_AMBULATORY_CARE_PROVIDER_SITE_OTHER): Payer: Medicare Other | Admitting: Internal Medicine

## 2011-01-14 DIAGNOSIS — J45901 Unspecified asthma with (acute) exacerbation: Secondary | ICD-10-CM

## 2011-01-14 DIAGNOSIS — J209 Acute bronchitis, unspecified: Secondary | ICD-10-CM | POA: Insufficient documentation

## 2011-01-14 DIAGNOSIS — J441 Chronic obstructive pulmonary disease with (acute) exacerbation: Secondary | ICD-10-CM

## 2011-01-14 DIAGNOSIS — J45909 Unspecified asthma, uncomplicated: Secondary | ICD-10-CM

## 2011-01-14 DIAGNOSIS — E785 Hyperlipidemia, unspecified: Secondary | ICD-10-CM

## 2011-01-14 DIAGNOSIS — J449 Chronic obstructive pulmonary disease, unspecified: Secondary | ICD-10-CM

## 2011-01-14 DIAGNOSIS — J988 Other specified respiratory disorders: Secondary | ICD-10-CM

## 2011-01-14 DIAGNOSIS — J4489 Other specified chronic obstructive pulmonary disease: Secondary | ICD-10-CM

## 2011-01-14 MED ORDER — DEXAMETHASONE 1.5 MG PO KIT: 1.0000 | PACK | ORAL | Status: DC

## 2011-01-14 MED ORDER — ROSUVASTATIN CALCIUM 20 MG PO TABS: 20.0000 mg | ORAL_TABLET | Freq: Every day | ORAL | Status: DC

## 2011-01-14 MED ORDER — MOXIFLOXACIN HCL 400 MG PO TABS: 400.0000 mg | ORAL_TABLET | Freq: Every day | ORAL | Status: AC

## 2011-01-14 MED ORDER — METHYLPREDNISOLONE 4 MG PO KIT: PACK | ORAL | Status: AC

## 2011-01-14 MED ORDER — MOXIFLOXACIN HCL 400 MG PO TABS: 400.0000 mg | ORAL_TABLET | Freq: Every day | ORAL | Status: DC

## 2011-01-14 MED ORDER — BUDESONIDE-FORMOTEROL FUMARATE 160-4.5 MCG/ACT IN AERO: 2.0000 | INHALATION_SPRAY | Freq: Two times a day (BID) | RESPIRATORY_TRACT | Status: DC

## 2011-01-14 NOTE — Assessment & Plan Note (Signed)
She is not doing well on Qvar so I changed her to symbicort today

## 2011-01-14 NOTE — Progress Notes (Signed)
Subjective:    Patient ID: [name], female    DOB: 04/06/1933, 77 y.o.   MRN: [id number]  URI  This is a new problem. The current episode started in the past 7 days. The problem has been gradually worsening. There has been no fever. Associated symptoms include coughing (productive of yellow/green phelgm), a sore throat and wheezing. Pertinent negatives include no abdominal pain, chest pain, congestion, diarrhea, dysuria, ear pain, headaches, joint pain, joint swelling, nausea, neck pain, plugged ear sensation, rash, rhinorrhea, sinus pain, sneezing, swollen glands or vomiting. She has tried inhaler use for the symptoms. The treatment provided mild relief.      Review of Systems  Constitutional: Positive for chills. Negative for fever, diaphoresis, activity change, appetite change, fatigue and unexpected weight change.  HENT: Positive for sore throat. Negative for hearing loss, ear pain, congestion, facial swelling, rhinorrhea, sneezing, mouth sores, trouble swallowing, neck pain, neck stiffness, voice change, sinus pressure, tinnitus and ear discharge.   Eyes: Negative for photophobia and visual disturbance.  Respiratory: Positive for cough (productive of yellow/green phelgm), shortness of breath and wheezing. Negative for apnea, choking, chest tightness and stridor.   Cardiovascular: Negative for chest pain, palpitations and leg swelling.  Gastrointestinal: Negative for nausea, vomiting, abdominal pain and diarrhea.  Genitourinary: Negative.  Negative for dysuria.  Musculoskeletal: Negative for myalgias, back pain, joint pain, joint swelling, arthralgias and gait problem.  Skin: Negative for color change, pallor, rash and wound.  Neurological: Negative.  Negative for headaches.  Hematological: Negative for adenopathy. Does not bruise/bleed easily.  Psychiatric/Behavioral: Negative.        Objective:   Physical Exam  Vitals reviewed. Constitutional: She is oriented to person,  place, and time. She appears well-developed and well-nourished. No distress.  HENT:  Head: Normocephalic and atraumatic.  Right Ear: External ear normal.  Left Ear: External ear normal.  Nose: Nose normal.  Mouth/Throat: Oropharynx is clear and moist. No oropharyngeal exudate.  Eyes: Conjunctivae and EOM are normal. Pupils are equal, round, and reactive to light. Right eye exhibits no discharge. Left eye exhibits no discharge. No scleral icterus.  Neck: Normal range of motion. Neck supple. No JVD present. No tracheal deviation present. No thyromegaly present.  Cardiovascular: Normal rate, regular rhythm, normal heart sounds and intact distal pulses.  Exam reveals no gallop and no friction rub.   No murmur heard. Pulmonary/Chest: Effort normal. No accessory muscle usage or stridor. Not tachypneic. No respiratory distress. She has no decreased breath sounds. She has wheezes in the right middle field and the left middle field. She has rhonchi in the right middle field and the left middle field. She has no rales. She exhibits no tenderness.       After she received a neb treatment with 2.5 mg of albuterol she continues to have good air movement with no wheezes and rare exp rhonchi  Abdominal: Soft. Bowel sounds are normal. She exhibits no distension and no mass. There is no tenderness. There is no rebound and no guarding.  Musculoskeletal: Normal range of motion. She exhibits no edema and no tenderness.  Lymphadenopathy:    She has no cervical adenopathy.  Neurological: She is alert and oriented to person, place, and time. She has normal reflexes. She displays normal reflexes. No cranial nerve deficit. She exhibits normal muscle tone. Coordination normal.  Skin: Skin is warm and dry. No rash noted. She is not diaphoretic. No erythema. No pallor.  Psychiatric: She has a normal mood and affect. Her  behavior is normal. Judgment and thought content normal.      Lab Results  Component Value Date    WBC 7.2 08/25/2010   HGB 12.7 12/21/2010   HCT 37.2 12/21/2010   PLT 231 12/21/2010   CHOL 245* 08/25/2010   TRIG 180.0* 08/25/2010   HDL 73.60 08/25/2010   LDLDIRECT 153.5 08/25/2010   ALT 31 08/25/2010   AST 34 12/21/2010   NA 143 12/21/2010   K 4.0 12/21/2010   CL 97* 12/21/2010   CREATININE 0.9 12/21/2010   BUN 10 12/21/2010   CO2 29 12/21/2010   TSH 2.09 08/25/2010   INR 0.94 02/05/2010   HGBA1C 5.3 05/06/2010      Assessment & Plan:   No problem-specific assessment & plan notes found for this encounter.

## 2011-01-14 NOTE — Assessment & Plan Note (Signed)
meds changed today

## 2011-01-14 NOTE — Assessment & Plan Note (Signed)
She is producing purulent phlegm so I have started her on Avelox for bact infection

## 2011-01-14 NOTE — Assessment & Plan Note (Signed)
She is doing well on crestor 

## 2011-01-14 NOTE — Assessment & Plan Note (Signed)
She has had a flare of asthma with wheezing so I gave her an albuterol treatment and have asker her to stop a dexpak to decrease the inflammation

## 2011-01-14 NOTE — Patient Instructions (Signed)
Asthma, Adult Asthma is caused by narrowing of the air passages in the lungs. It may be triggered by pollen, dust, animal dander, molds, some foods, respiratory infections, exposure to smoke, exercise, emotional stress or other allergens (things that cause allergic reactions or allergies). Repeat attacks are common. HOME CARE INSTRUCTIONS  Use prescription medications as ordered by your caregiver.   Avoid pollen, dust, animal dander, molds, smoke and other things that cause attacks at home and at work.   You may have fewer attacks if you decrease dust in your home. Electrostatic air cleaners may help.   It may help to replace your pillows or mattress with materials less likely to cause allergies.   Talk to your caregiver about an action plan for managing asthma attacks at home, including, the use of a peak flow meter which measures the severity of your asthma attack. An action plan can help minimize or stop the attack without having to seek medical care.   If you are not on a fluid restriction, drink 8 to 10 glasses of water each day.   Always have a plan prepared for seeking medical attention, including, calling your physician, accessing local emergency care, and calling 911 (in the [place] for a severe attack.   Discuss possible exercise routines with your caregiver.   If animal dander is the cause of asthma, you may need to get rid of pets.  SEEK MEDICAL CARE IF:  You have wheezing and shortness of breath even if taking medicine to prevent attacks.   An oral temperature above 100.5 develops.   You have muscle aches, chest pain or thickening of sputum.   Your sputum changes from clear or white to yellow, green, gray or bloody.   You have any problems that may be related to the medicine you are taking (such as a rash, itching, swelling or trouble breathing).  SEEK IMMEDIATE MEDICAL CARE IF:  Your usual medicines do not stop your wheezing or there is increased coughing and/or  shortness of breath.   You have increased difficulty breathing.  You have an oral temperature above 100.5Bronchitis Bronchitis is the body's way of reacting to injury and/or infection (inflammation) of the bronchi. Bronchi are the air tubes that extend from the windpipe into the lungs. If the inflammation becomes severe, it may cause shortness of breath.  CAUSES Inflammation may be caused by: A virus.  Germs (bacteria).  Dust.  Allergens.  Pollutants and many other irritants.  The cells lining the bronchial tree are covered with tiny hairs (cilia). These constantly beat upward, away from the lungs, toward the mouth. This keeps the lungs free of pollutants. When these cells become too irritated and are unable to do their job, mucus begins to develop. This causes the characteristic cough of bronchitis. The cough clears the lungs when the cilia are unable to do their job. Without either of these protective mechanisms, the mucus would settle in the lungs. Then you would develop pneumonia. Smoking is a common cause of bronchitis and can contribute to pneumonia. Stopping this habit is the single most important thing you can do to help yourself. TREATMENT Your caregiver may prescribe an antibiotic if the cough is caused by bacteria. Also, medicines that open up your airways make it easier to breathe. Your caregiver may also recommend or prescribe an expectorant. It will loosen the mucus to be coughed up. Only take over-the-counter or prescription medicines for pain, discomfort, or fever as directed by your caregiver.  Removing whatever causes the   problem (smoking, for example) is critical to preventing the problem from getting worse.  Cough suppressants may be prescribed for relief of cough symptoms.  Inhaled medicines may be prescribed to help with symptoms now and to help prevent problems from returning.  For those with recurrent (chronic) bronchitis, there may be a need for steroid medicines.  SEEK  IMMEDIATE MEDICAL CARE IF: During treatment, you develop more pus-like mucus (purulent sputum).  You or your child has an oral temperature above 100.5, not controlled by medicine.  Your baby is older than 3 months with a rectal temperature of 102 F (38.9 C) or higher.  Your baby is 3 months old or younger with a rectal temperature of 100.4 F (38 C) or higher.  You become progressively more ill.  You have increased difficulty breathing, wheezing, or shortness of breath.  It is necessary to seek immediate medical care if you are elderly or sick from any other disease. MAKE SURE YOU: Understand these instructions.  Will watch your condition.  Will get help right away if you are not doing well or get worse.  Document Released: 06/06/2005 Document Re-Released: 08/31/2009  ExitCare Patient Information 2011 [employer] not controlled by medicine.  MAKE SURE YOU:  Understand these instructions.   Will watch your condition.   Will get help right away if you are not doing well or get worse.  Document Released: 06/06/2005 Document Re-Released: 06/28/2009 Exit[employer] Patient Information 2011 [employer] 

## 2011-01-17 ENCOUNTER — Telehealth: Payer: Self-pay | Admitting: *Deleted

## 2011-01-17 NOTE — Telephone Encounter (Signed)
Patient requesting a call back regarding last OV.

## 2011-01-18 NOTE — Telephone Encounter (Signed)
Spoke w/pt -   1.She says she thought that there was a "shot" on the table when she had her breathing treatment and she left w/o getting injection? Did you want patient to get steroid injection at OV?   2. C/o dizziness since being on symbicort - has fallen x 2. Cough was not productive yesterday but today she is coughing up green mucus again.

## 2011-01-19 ENCOUNTER — Ambulatory Visit: Payer: Medicare Other | Admitting: Internal Medicine

## 2011-01-19 NOTE — Telephone Encounter (Signed)
No shot needed, she was given an Rx for oral steroids

## 2011-01-19 NOTE — Telephone Encounter (Signed)
Pt continues to cough up "green stuff" more today and still wheezing. No fever or SOB, still has 5 more days of avelox left. Advised her to take her rx's & mucinex as directed. She will call office back if no improvement by Friday.

## 2011-01-28 ENCOUNTER — Other Ambulatory Visit: Payer: Self-pay | Admitting: Internal Medicine

## 2011-01-28 ENCOUNTER — Other Ambulatory Visit (INDEPENDENT_AMBULATORY_CARE_PROVIDER_SITE_OTHER): Payer: Medicare Other

## 2011-01-28 ENCOUNTER — Ambulatory Visit (INDEPENDENT_AMBULATORY_CARE_PROVIDER_SITE_OTHER): Payer: Medicare Other | Admitting: Internal Medicine

## 2011-01-28 ENCOUNTER — Encounter: Payer: Self-pay | Admitting: Internal Medicine

## 2011-01-28 VITALS — BP 130/62 | HR 90 | Temp 98.8°F | Resp 20

## 2011-01-28 DIAGNOSIS — E785 Hyperlipidemia, unspecified: Secondary | ICD-10-CM

## 2011-01-28 DIAGNOSIS — E039 Hypothyroidism, unspecified: Secondary | ICD-10-CM

## 2011-01-28 DIAGNOSIS — G629 Polyneuropathy, unspecified: Secondary | ICD-10-CM

## 2011-01-28 DIAGNOSIS — G609 Hereditary and idiopathic neuropathy, unspecified: Secondary | ICD-10-CM

## 2011-01-28 DIAGNOSIS — J449 Chronic obstructive pulmonary disease, unspecified: Secondary | ICD-10-CM

## 2011-01-28 DIAGNOSIS — R7309 Other abnormal glucose: Secondary | ICD-10-CM

## 2011-01-28 LAB — COMPREHENSIVE METABOLIC PANEL
ALT: 32 U/L (ref 0–35)
CO2: 32 mEq/L (ref 19–32)
GFR: 92.6 mL/min (ref >60.00)
Potassium: 3.8 mEq/L (ref 3.5–5.1)
Sodium: 138 mEq/L (ref 135–145)
Total Bilirubin: 0.7 mg/dL (ref 0.3–1.2)
Total Protein: 7.6 g/dL (ref 6.0–8.3)

## 2011-01-28 LAB — CBC WITH DIFFERENTIAL/PLATELET
Basophils Relative: 1.1 % (ref 0.0–3.0)
Eosinophils Relative: 0.3 % (ref 0.0–5.0)
HCT: 40.8 % (ref 36.0–46.0)
Hemoglobin: 13.5 g/dL (ref 12.0–15.0)
MCV: 101.9 fl — ABNORMAL HIGH (ref 78.0–100.0)
Monocytes Absolute: 0.8 10*3/uL (ref 0.1–1.0)
Neutrophils Relative %: 70.9 % (ref 43.0–77.0)
RBC: 4 Mil/uL (ref 3.87–5.11)
WBC: 8.1 10*3/uL (ref 4.5–10.5)

## 2011-01-28 LAB — VITAMIN B12: Vitamin B-12: 868 pg/mL (ref 211–911)

## 2011-01-28 LAB — LIPID PANEL: HDL: 121.2 mg/dL (ref >39.00)

## 2011-01-28 NOTE — Progress Notes (Signed)
Subjective:    Patient ID: [name], female    DOB: [birth year], 77 y.o.   MRN: [id number]  HPI She returns for f/up and tells me that her lungs feel much better but today her main complaint is numbness and tingling in her feet and lower legs - this has been worsening for several months.  Review of Systems  Constitutional: Negative for fever, chills, diaphoresis, activity change, appetite change, fatigue and unexpected weight change.  HENT: Negative.   Eyes: Negative.   Respiratory: Positive for shortness of breath. Negative for apnea, choking, chest tightness, wheezing and stridor. Cough: clear phlegm.   Cardiovascular: Negative for chest pain, palpitations and leg swelling.  Gastrointestinal: Negative.   Genitourinary: Negative.   Musculoskeletal: Negative.   Neurological: Positive for numbness. Negative for dizziness, tremors, seizures, syncope, facial asymmetry, speech difficulty, weakness, light-headedness and headaches.  Hematological: Negative for adenopathy. Does not bruise/bleed easily.  Psychiatric/Behavioral: Negative.        Objective:   Physical Exam  Vitals reviewed. Constitutional: She appears well-developed and well-nourished. No distress.  HENT:  Head: Normocephalic and atraumatic.  Right Ear: External ear normal.  Left Ear: External ear normal.  Nose: Nose normal.  Mouth/Throat: Oropharynx is clear and moist. No oropharyngeal exudate.  Eyes: Conjunctivae and EOM are normal. Pupils are equal, round, and reactive to light. Right eye exhibits no discharge. Left eye exhibits no discharge. No scleral icterus.  Neck: Normal range of motion. Neck supple. No JVD present. No tracheal deviation present. No thyromegaly present.  Cardiovascular: Normal rate, regular rhythm, normal heart sounds and intact distal pulses.  Exam reveals no gallop and no friction rub.   No murmur heard. Pulmonary/Chest: Effort normal and breath sounds normal. No stridor. No respiratory  distress. She has no wheezes. She has no rales. She exhibits no tenderness.  Abdominal: Soft. Bowel sounds are normal. She exhibits no distension and no mass. There is no tenderness. There is no rebound and no guarding.  Musculoskeletal: Normal range of motion. She exhibits no edema and no tenderness.  Lymphadenopathy:    She has no cervical adenopathy.  Neurological: She is alert. She has normal strength. She is not disoriented. She displays no atrophy, no tremor and normal reflexes. No cranial nerve deficit or sensory deficit. She exhibits normal muscle tone. She displays a negative Romberg sign. She displays no seizure activity. Coordination and gait normal.  Reflex Scores:      Tricep reflexes are 1+ on the right side and 1+ on the left side.      Bicep reflexes are 1+ on the right side and 1+ on the left side.      Brachioradialis reflexes are 1+ on the right side and 1+ on the left side.      Patellar reflexes are 1+ on the right side and 1+ on the left side.      Achilles reflexes are 1+ on the right side and 1+ on the left side. Skin: Skin is warm and dry. No rash noted. She is not diaphoretic. No erythema. No pallor.  Psychiatric: She has a normal mood and affect. Her behavior is normal. Judgment and thought content normal.      Lab Results  Component Value Date   WBC 7.2 08/25/2010   HGB 12.7 12/21/2010   HCT 37.2 12/21/2010   PLT 231 12/21/2010   CHOL 245* 08/25/2010   TRIG 180.0* 08/25/2010   HDL 73.60 08/25/2010   LDLDIRECT 153.5 08/25/2010   ALT 31 08/25/2010  AST 34 12/21/2010   NA 143 12/21/2010   K 4.0 12/21/2010   CL 97* 12/21/2010   CREATININE 0.9 12/21/2010   BUN 10 12/21/2010   CO2 29 12/21/2010   TSH 2.09 08/25/2010   INR 0.94 02/05/2010   HGBA1C 5.3 05/06/2010      Assessment & Plan:

## 2011-01-28 NOTE — Patient Instructions (Signed)
Neuropathy     You have symptoms of neuropathy. Neuropathy means your peripheral nerves are not working normally. There are many different causes of peripheral nerve disorders. These include:   Injury   Infections   Diabetes   Vitamin deficiency  Poor circulation   Alcoholism   Exposure to toxins   Drug effects   Tumors, as well as liver and kidney disease, can also cause neuropathy. Blood tests and special studies of nerve function may help confirm the diagnosis. The symptoms of neuropathy are similar, no matter what the cause. Some of these are:   Tingling, burning, pain, and numbness in the extremities.   Weakness and loss of muscle tone and size.     Treatment includes adopting healthy life habits. A good diet, vitamin supplements and mild pain medication may be needed. Avoid known toxins such as alcohol, tobacco, and recreational drugs.  Neuropathy symptoms may not improve very quickly. While it can cause chronic symptoms, neuropathy is rarely severe or fatal unless complicated by other diseases. Anti-convulsant medicines are helpful in some types of neuropathy.  Make a follow-up appointment with your caregiver to be sure you are getting better with treatment.  Call your caregiver if you are not better after one week or if you have worse symptoms.      SEEK IMMEDIATE MEDICAL CARE IF:   You have breathing problems.   You have severe or uncontrolled pain.   You notice extreme weakness or you feel faint.     Document Released: 07/14/2004  Document Re-Released: 04/03/2007  ExitCare Patient Information 2011 [employer]

## 2011-01-29 ENCOUNTER — Encounter: Payer: Self-pay | Admitting: Internal Medicine

## 2011-01-29 NOTE — Assessment & Plan Note (Signed)
I will check labs for secondary causes and will ask her to do a NCS/EMG to get some diagnostic information

## 2011-01-29 NOTE — Assessment & Plan Note (Signed)
I will check her FLP today 

## 2011-01-29 NOTE — Assessment & Plan Note (Signed)
She is due for an A1C test 

## 2011-01-29 NOTE — Assessment & Plan Note (Signed)
I will check her TSH today 

## 2011-01-29 NOTE — Assessment & Plan Note (Signed)
It sounds like this is stable +/- improved

## 2011-01-31 ENCOUNTER — Telehealth: Payer: Self-pay

## 2011-01-31 NOTE — Telephone Encounter (Signed)
Patient called [facility] on triage requesting a call back.

## 2011-02-01 LAB — PROTEIN ELECTROPHORESIS, SERUM
Alpha-1-Globulin: 4.8 % (ref 2.9–4.9)
Alpha-2-Globulin: 12.2 % — ABNORMAL HIGH (ref 7.1–11.8)
Beta 2: 4.1 % (ref 3.2–6.5)
Gamma Globulin: 19.2 % — ABNORMAL HIGH (ref 11.1–18.8)

## 2011-02-01 LAB — METHYLMALONIC ACID, SERUM: Methylmalonic Acid, Quantitative: 148 nmol/L (ref 87–318)

## 2011-02-01 MED ORDER — MOXIFLOXACIN HCL 400 MG PO TABS: 400.0000 mg | ORAL_TABLET | Freq: Every day | ORAL | Status: AC

## 2011-02-01 NOTE — Telephone Encounter (Signed)
Patient notified and rx sent to pharmacy/[facility]

## 2011-02-01 NOTE — Telephone Encounter (Signed)
Spoke with patient who states that at appt she thought that antibiotic would not be needing. She now c/o brown/yellow mucous and would now like to have an antibiotic called in for her pharmacy. Thanks

## 2011-02-02 ENCOUNTER — Other Ambulatory Visit: Payer: Self-pay | Admitting: Internal Medicine

## 2011-02-02 ENCOUNTER — Encounter: Payer: Self-pay | Admitting: Internal Medicine

## 2011-02-02 DIAGNOSIS — G629 Polyneuropathy, unspecified: Secondary | ICD-10-CM

## 2011-02-03 ENCOUNTER — Ambulatory Visit (INDEPENDENT_AMBULATORY_CARE_PROVIDER_SITE_OTHER)
Admission: RE | Admit: 2011-02-03 | Discharge: 2011-02-03 | Disposition: A | Discharge status: Home / Self Care | Payer: Medicare Other | Source: Ambulatory Visit | Attending: Internal Medicine | Admitting: Internal Medicine

## 2011-02-03 ENCOUNTER — Encounter: Payer: Self-pay | Admitting: Internal Medicine

## 2011-02-03 ENCOUNTER — Ambulatory Visit (INDEPENDENT_AMBULATORY_CARE_PROVIDER_SITE_OTHER): Payer: Medicare Other | Admitting: Internal Medicine

## 2011-02-03 ENCOUNTER — Other Ambulatory Visit: Payer: Medicare Other

## 2011-02-03 VITALS — BP 118/74 | HR 94 | Ht 67.0 in | Wt 168.8 lb

## 2011-02-03 DIAGNOSIS — J441 Chronic obstructive pulmonary disease with (acute) exacerbation: Secondary | ICD-10-CM

## 2011-02-03 DIAGNOSIS — B370 Candidal stomatitis: Secondary | ICD-10-CM

## 2011-02-03 DIAGNOSIS — J45901 Unspecified asthma with (acute) exacerbation: Secondary | ICD-10-CM

## 2011-02-03 MED ORDER — METHYLPREDNISOLONE ACETATE 80 MG/ML IJ SUSP
80.0000 mg | Freq: Once | INTRAMUSCULAR | Status: AC
  Administered 2011-02-03: 80 mg via INTRAMUSCULAR

## 2011-02-03 MED ORDER — BENZONATATE 100 MG PO CAPS: 100.0000 mg | ORAL_CAPSULE | Freq: Four times a day (QID) | ORAL | Status: DC | PRN

## 2011-02-03 MED ORDER — HYDROCODONE-HOMATROPINE 5-1.5 MG/5ML PO SYRP: 2.5000 mL | ORAL_SOLUTION | Freq: Four times a day (QID) | ORAL | Status: AC | PRN

## 2011-02-03 NOTE — Progress Notes (Signed)
Subjective:    Patient ID: [name], female    DOB: 10-01-1932, 77 y.o.   MRN: [id number]  HPI [birth year]77 year old female former smoker followed for history of right upper lobe cancer, COPD, allergic rhinitis right upper lobe/PET Here with husband Last here -August 05, 2010- note reviewed Increased  coughing 2 weeks, but struggling all summer. Better at beach. Here few days ago to see Dr Yetta Barre.   Had significantly productive cough- purulent, sometimes  with black spots. Uses nebulizer 2-3x/day, O2 2l/ [employer]. On Avelox now with Symbicort from Dr Yetta Barre. Mouth is getting sore. Still actively wheezing and short of breath.  CXR December 07, 2010- clear except old surgical changes.  She is not sure if she began this flare with cold or allergy. Low fever once or twice in past month. CT w/cm- 12/21/10- normal heart size coronary calcification. Prior right upper lobectomy. Stable right millimeter nodule at right minor fissure. No suspicious nodules. Centrilobular emphysema.  SATURATION QUALIFICATIONS: Patient Saturations on Room Air while Ambulating = 85% Patient Saturations on 2 Liters of oxygen while Ambulating = 96%[name],CMA   Review of Systems Constitutional:   No-   weight loss, night sweats, , chills, fatigue, lassitude. HEENT:   No-  headaches, difficulty swallowing, tooth/dental problems, sore throat,       No-  sneezing, itching, ear ache, nasal congestion, post nasal drip,  CV:  No-   chest pain, orthopnea, PND, swelling in lower extremities, anasarca, dizziness, palpitations Resp:    shortness of breath with exertion or at rest.                 productive cough,   non-productive cough,  No-  coughing up of blood.              some   change in color of mucus.    Skin: No-   rash or lesions. GI:  No-   heartburn, indigestion, abdominal pain, nausea, vomiting, diarrhea,                 change in bowel habits, loss of appetite GU: No-   dysuria, change in color of  urine, no urgency or frequency.  No- flank pain. MS:  No-   joint pain or swelling.  No- decreased range of motion.  No- back pain. Neuro- grossly normal to observation, Or:  Psych:  No- change in mood or affect. No depression or anxiety.  No memory loss.     Objective:   Physical Exam General- Alert, Oriented, Affect-appropriate, Distress- none acute, but   ill appearing ;  PO2 2L  96% at rest Skin- rash-none, lesions- none, excoriation- none Lymphadenopathy- none Head- atraumatic            Eyes- Gross vision intact, PERRLA, conjunctivae clear secretions            Ears- Hearing, canals normal            Nose- Clear, no-Septal dev, mucus, polyps, erosion, perforation             Throat- Mallampati II , mucosa clear , drainage- none, tonsils- atrophic Neck- flexible , trachea midline, no stridor , thyroid nl, carotid no bruit Chest - symmetrical excursion , unlabored           Heart/CV- RRR , no murmur , no gallop  , no rub, nl s1 s2                           -  JVD- none , edema- none, stasis changes- none, varices- none           Lung- bilateral wheeze, harsh cough , dullness-none, rub- none           Chest wall-  Abd- tender-no, distended-no, bowel sounds-present, HSM- no Br/ Gen/ Rectal- Not done, not indicated Extrem- cyanosis- none, clubbing, none, atrophy- none, strength- nl Neuro- grossly intact to observation         Assessment & Plan:

## 2011-02-03 NOTE — Patient Instructions (Addendum)
Script Duke's Magic Mouthwash    150 ml     1 tablespoon swish and swallow 4 x daily  Order- CXR  Asthma with COPD             Find report of chest CT done at [facility] in June- done and noted  Sputum culture - routine, Fungal and AFB with smear and sensitivities  Today- Depo 80  Finish the current Avelox

## 2011-02-03 NOTE — Assessment & Plan Note (Addendum)
Nonspecific, possibly multifactorial exacerbation, not responding easily.  Plan- sputum cultures           Depo 80           CXR- dx asthma with COPD

## 2011-02-06 LAB — RESPIRATORY CULTURE OR RESPIRATORY AND SPUTUM CULTURE: Organism ID, Bacteria: NORMAL

## 2011-02-11 ENCOUNTER — Telehealth: Payer: Self-pay | Admitting: Internal Medicine

## 2011-02-11 NOTE — Telephone Encounter (Signed)
Pt aware of results 

## 2011-02-11 NOTE — Progress Notes (Signed)
Quick Note:  Pt aware of results. ______ 

## 2011-02-14 ENCOUNTER — Encounter: Payer: Self-pay | Admitting: Internal Medicine

## 2011-02-15 IMAGING — CT CT GUIDANCE NEEDLE PLACEMENT
1 of 5 series · 14 of 32 positions shown, 19 images · non-contrast
Comparison: none

CLINICAL DATA: 10 cm symptomatic cystic lesion of the posterior
left pelvis associated with the left adnexa.  The patient presents
for percutaneous drainage of the cyst.

[Series 2: rtn pelvis st · axial · 0.98mm/px · z∈[-326,-132]mm · 14 of 45 slices shown, 19 images]
[im 3/45  soft-tissue]
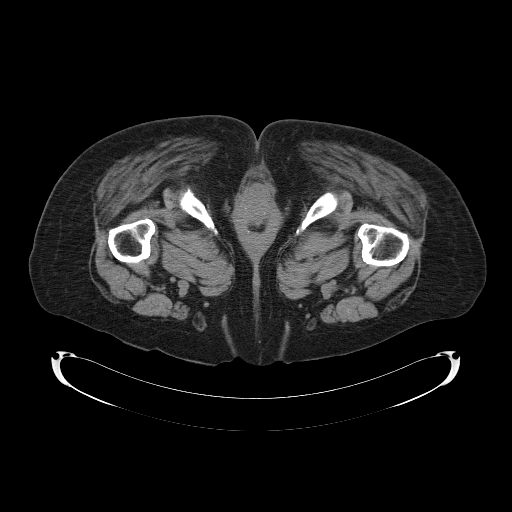
[im 3/45  bone]
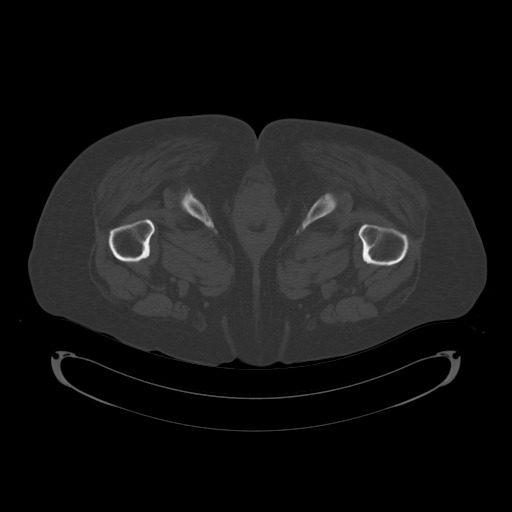
[im 6/45  soft-tissue]
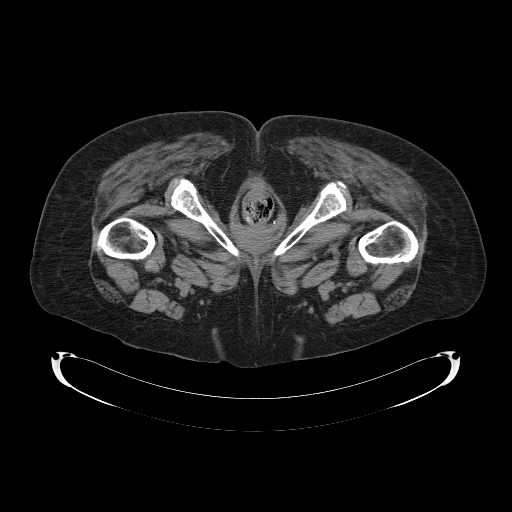
[im 11/45  soft-tissue]
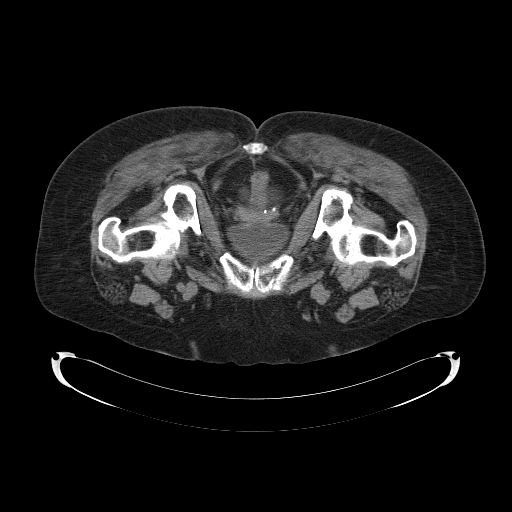
[im 13/45  soft-tissue]
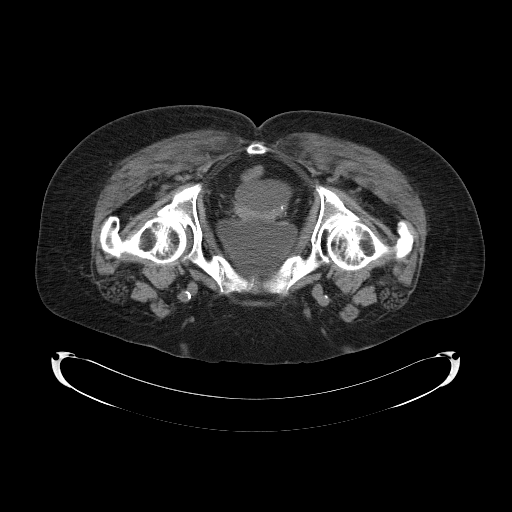
[im 16/45  soft-tissue]
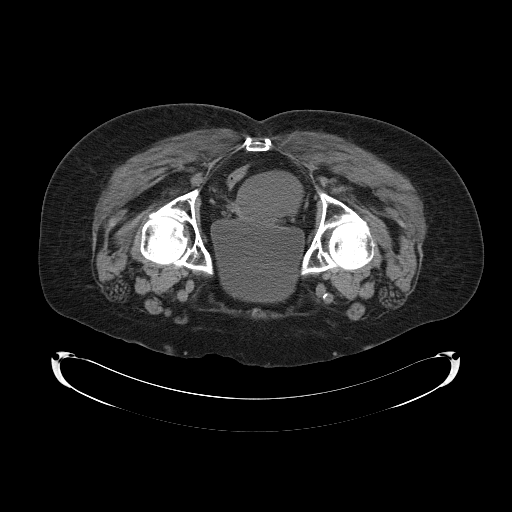
[im 19/45  soft-tissue]
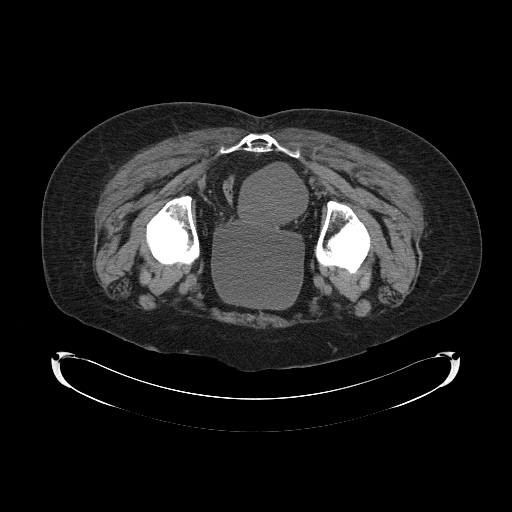
[im 24/45  soft-tissue]
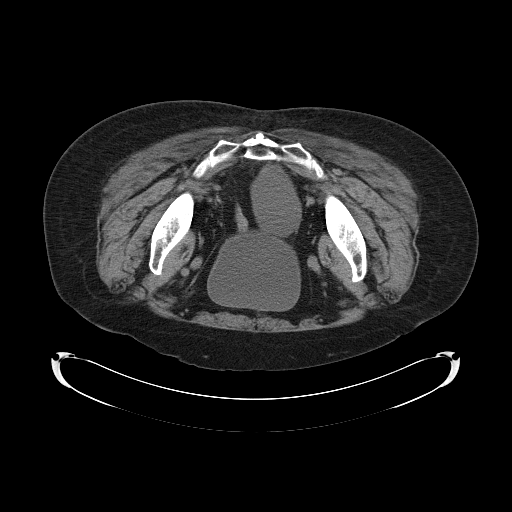
[im 26/45  soft-tissue]
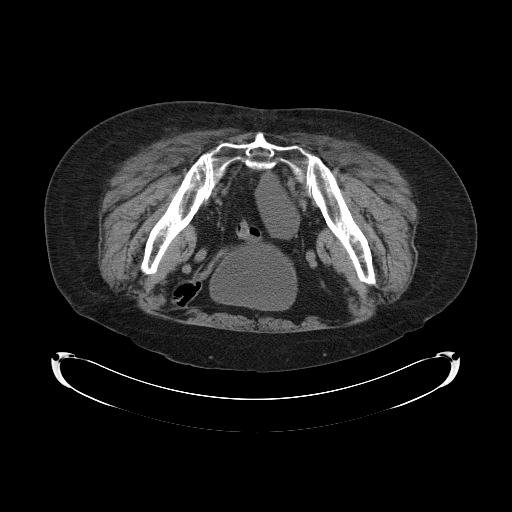
[im 29/45  soft-tissue]
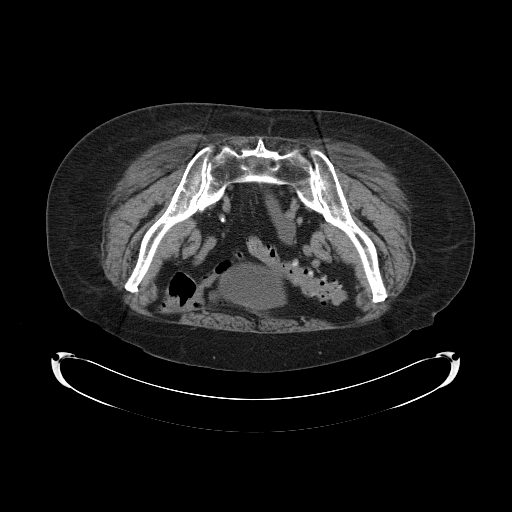
[im 29/45  bone]
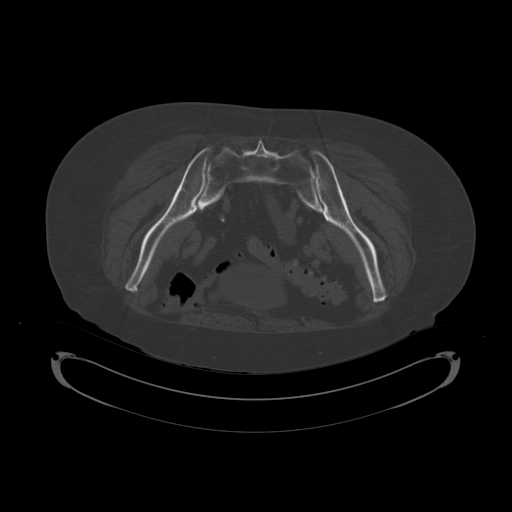
[im 32/45  soft-tissue]
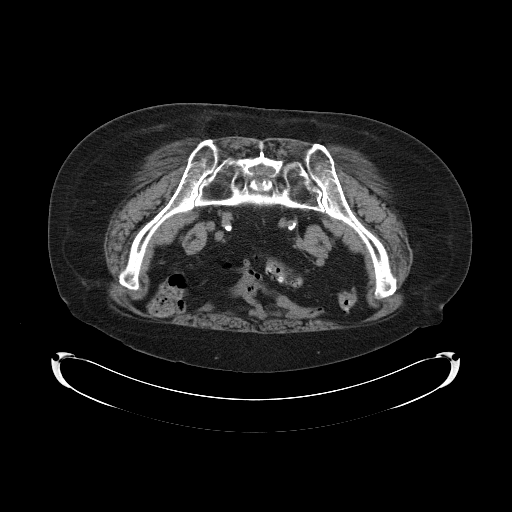
[im 34/45  soft-tissue]
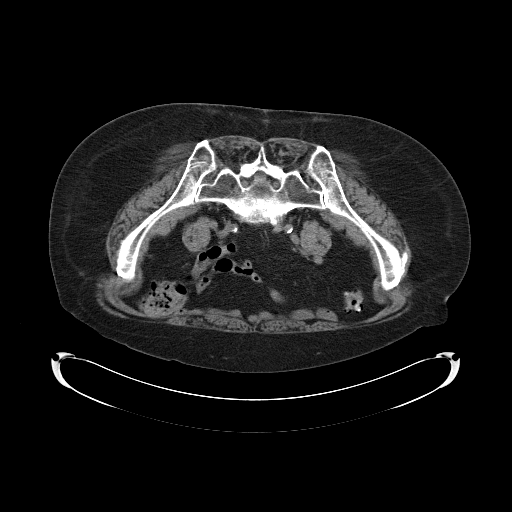
[im 34/45  lung]
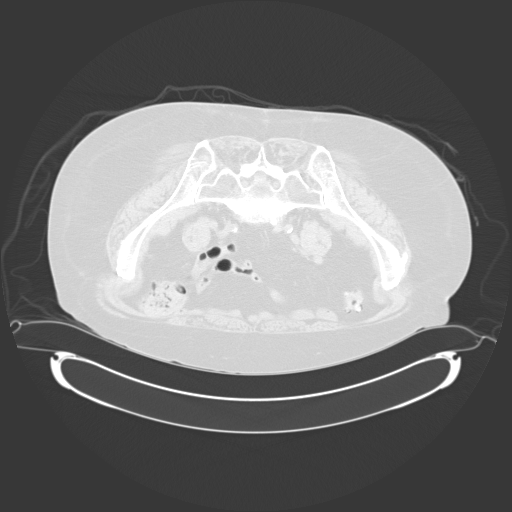
[im 37/45  lung]
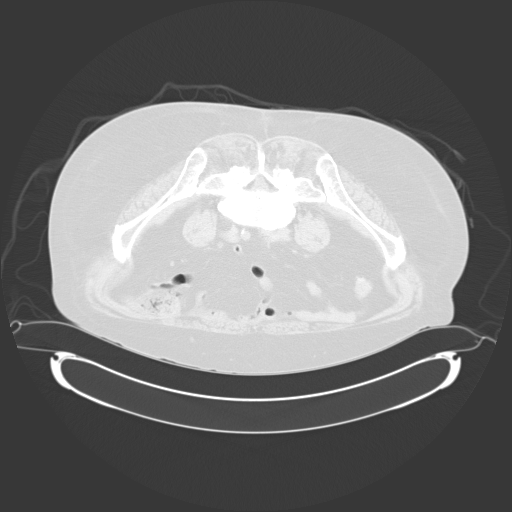
[im 39/45  soft-tissue]
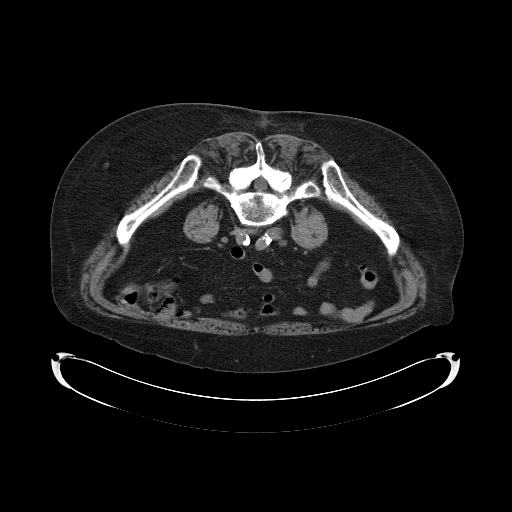
[im 39/45  lung]
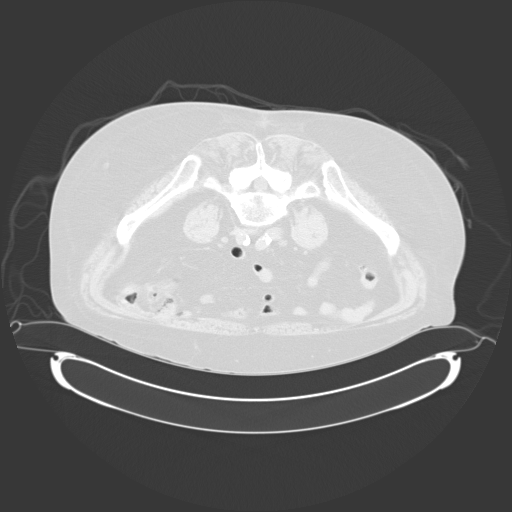
[im 42/45  soft-tissue]
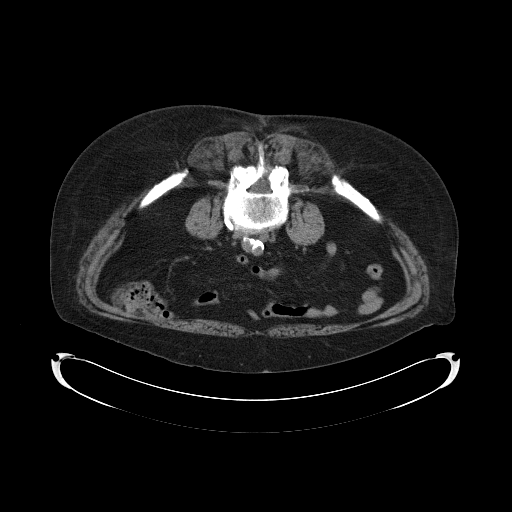
[im 42/45  lung]
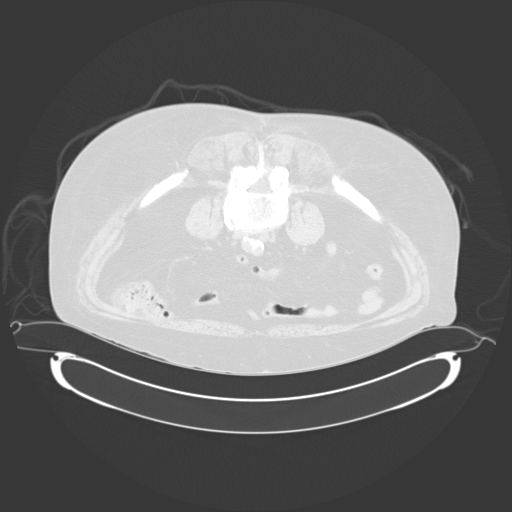

[14 of 32 positions shown; findings below may reference images not displayed]

CT GUIDED DRAINAGE OF PELVIC CYSTIC LESION

Sedation: Versed 2.0 mg IV, Fentanyl 100 mcg IV

Total Moderate Sedation Time: 10 minutes.

Procedure:  The procedure, risks, benefits, and alternatives were
explained to the patient.  Questions regarding the procedure were
encouraged and answered. The patient understands and consents to
the procedure.

The left transgluteal region was prepped with betadine in a sterile
fashion, and a sterile drape was applied covering the operative
field.  A sterile gown and sterile gloves were used for the
procedure. Local anesthesia was provided with 1% Lidocaine.

A 5-Jyotika Schubert centesis catheter was utilized for drainage.  This
was advanced over a 19 gauge needle into the cyst of the posterior
pelvis via a medial left transgluteal approach.  Aspiration of
fluid was performed after confirming catheter position by CT.  As
much fluid as could be removed was performed.  The catheter was
then removed.  All of the fluid was sent for cytologic analysis.

Complications: None
FINDINGS: Aspiration yielded dark brown fluid.  A total of 200 ml
of fluid was able to be removed resulting in near complete collapse
of the cyst.  There were no complications.
IMPRESSION: Aspiration of the posterior left pelvic cystic lesion yielding 200
ml of dark brown fluid.  This resulted in near complete
decompression of the cyst cavity.  All of the aspirated fluid was
sent for cytologic analysis.

## 2011-03-01 IMAGING — US US PELVIS COMPLETE
1 series · 13 of 25 positions shown · non-contrast
Comparison: 06/11/2009

CLINICAL DATA: Status post percutaneous drainage of left adnexal
fluid collection on 07/23/2009 with pain since aspiration.  Status
post hysterectomy and bilateral oophorectomy.

TRANSABDOMINAL AND TRANSVAGINAL ULTRASOUND OF PELVIS
TECHNIQUE: Both transabdominal and transvaginal ultrasound
examinations of the pelvis were performed including evaluation of
the adnexal regions, and pelvic cul-de-sac.

[Series 1: us pelvis complete · 0.47mm/px · 13 of 44 slices shown]
[im 1/44]
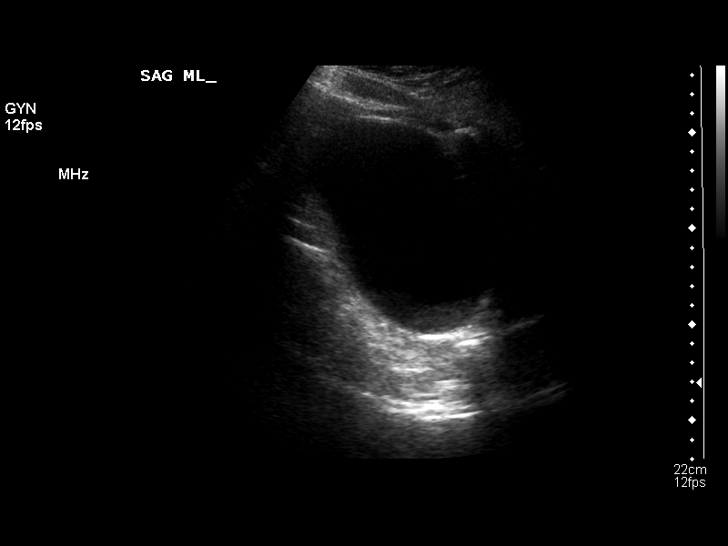
[im 4/44]
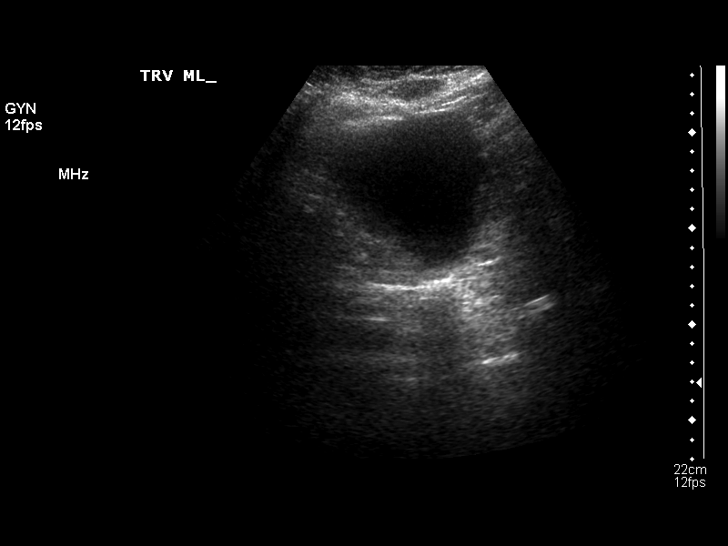
[im 8/44]
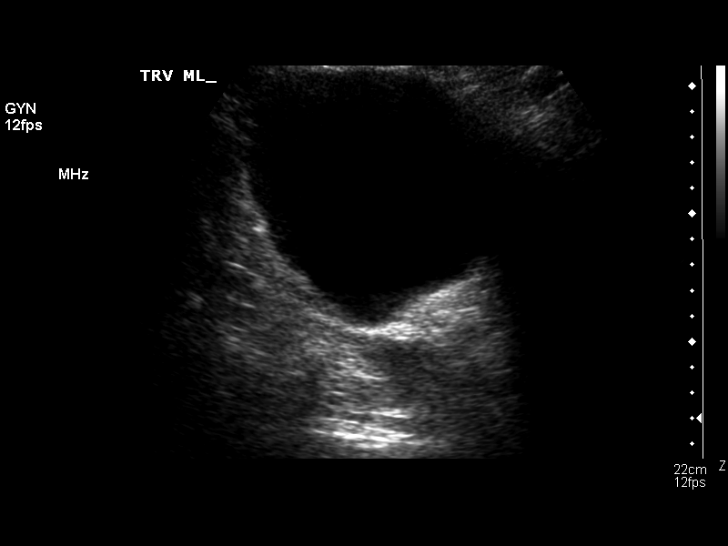
[im 11/44]
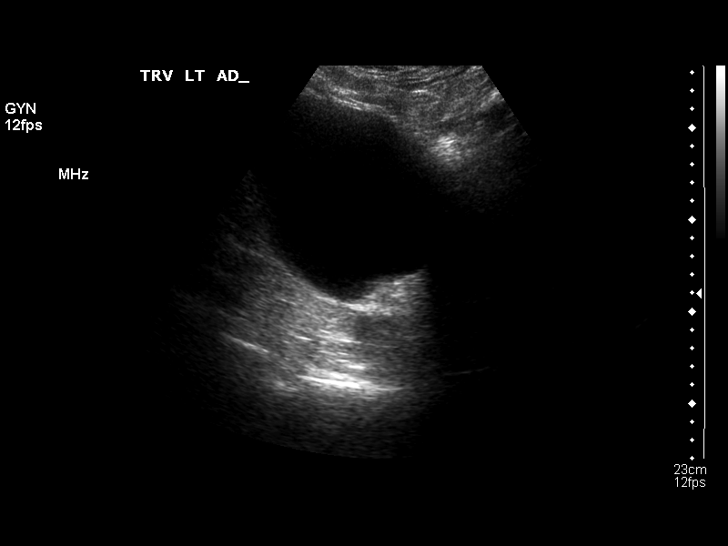
[im 15/44]
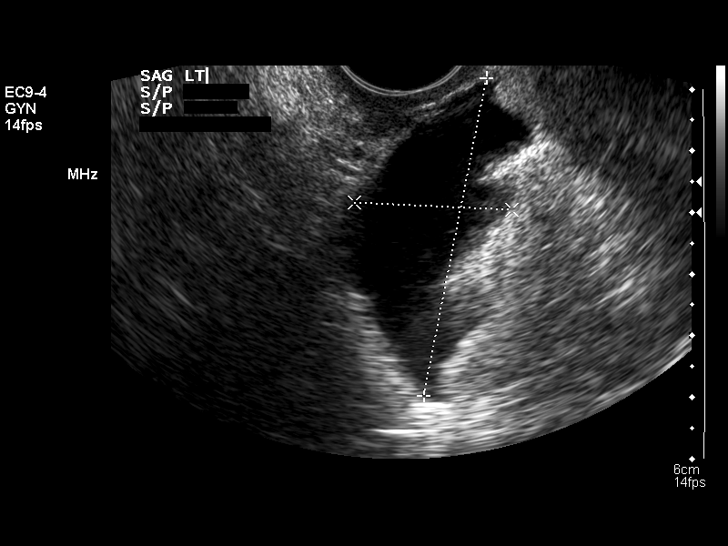
[im 18/44]
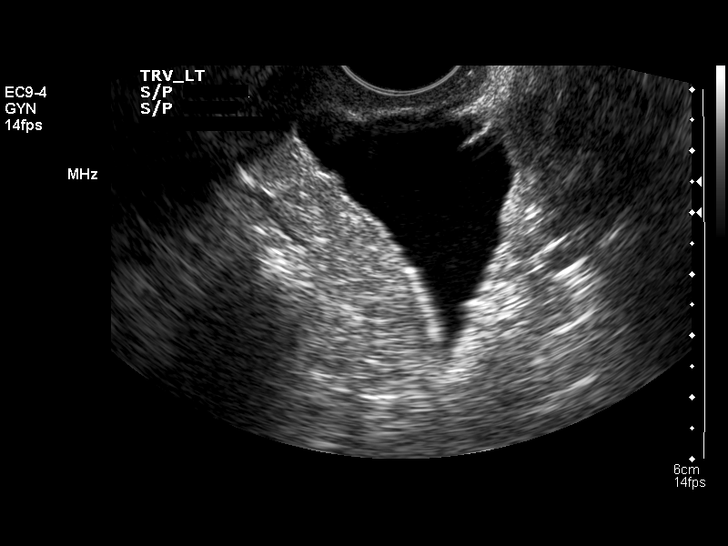
[im 22/44]
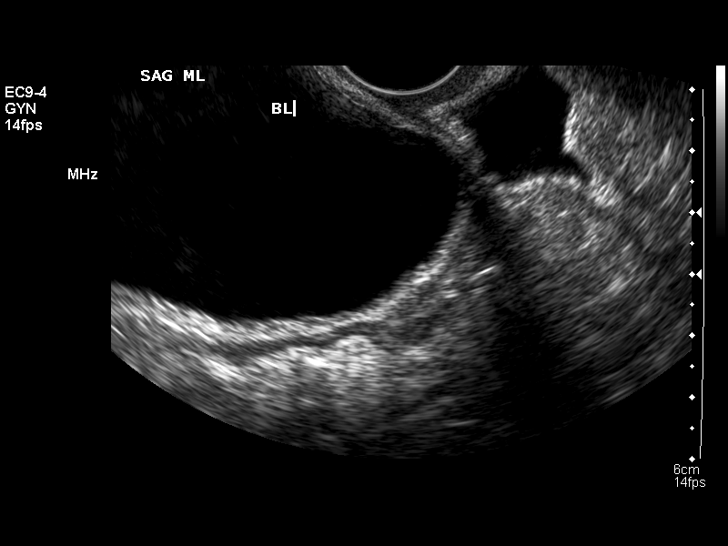
[im 26/44]
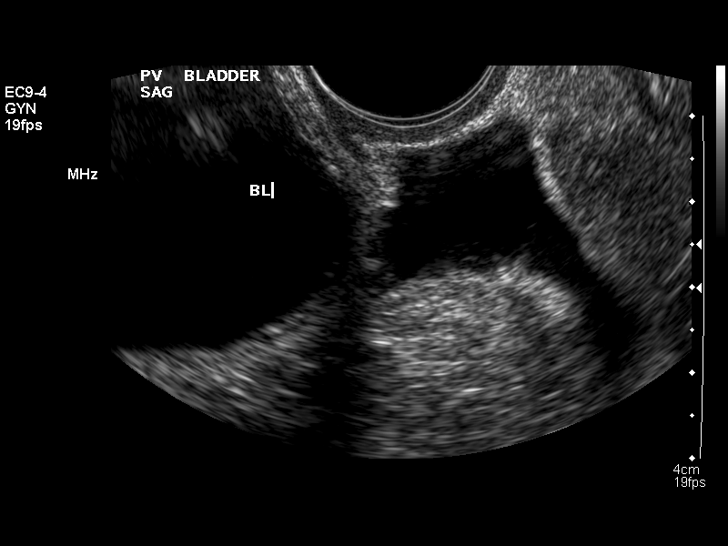
[im 29/44]
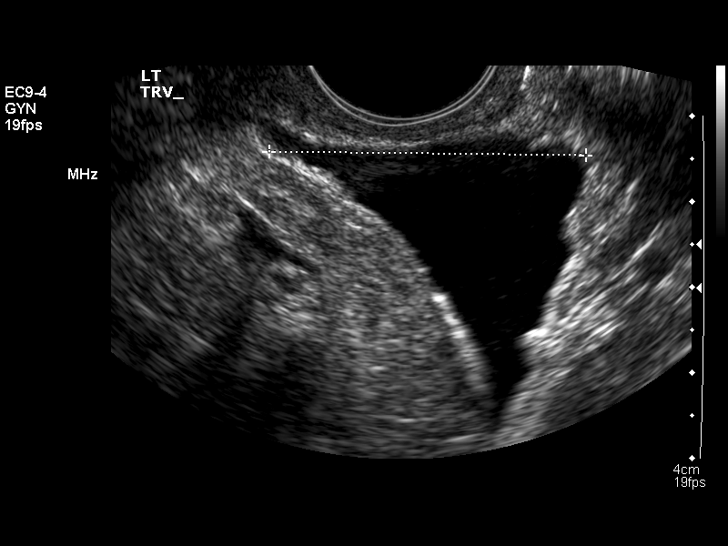
[im 33/44]
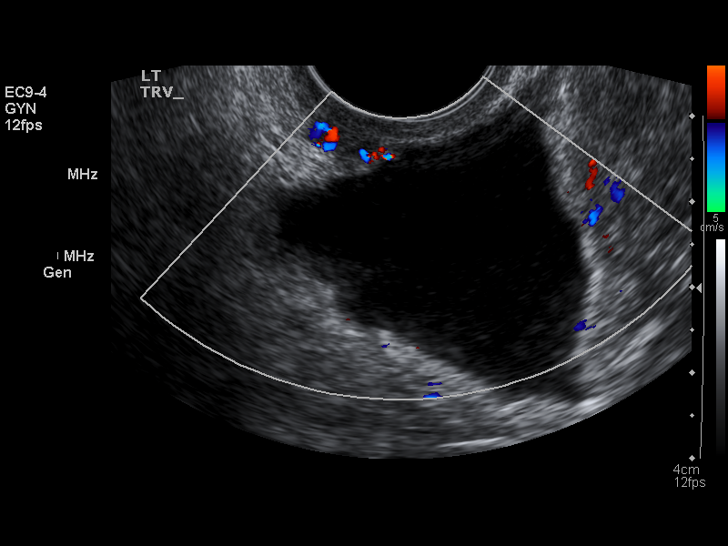
[im 36/44]
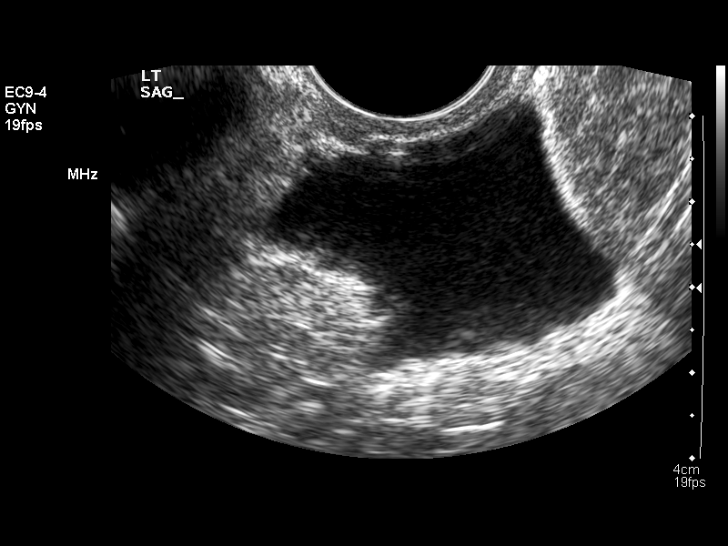
[im 40/44]
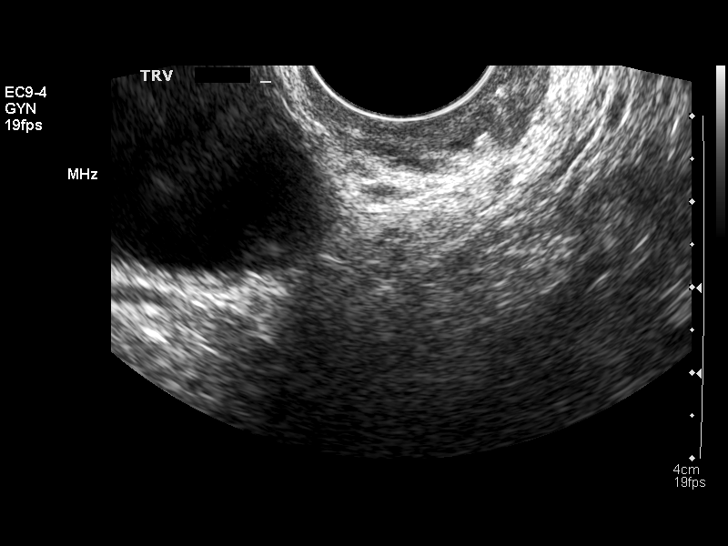
[im 44/44]
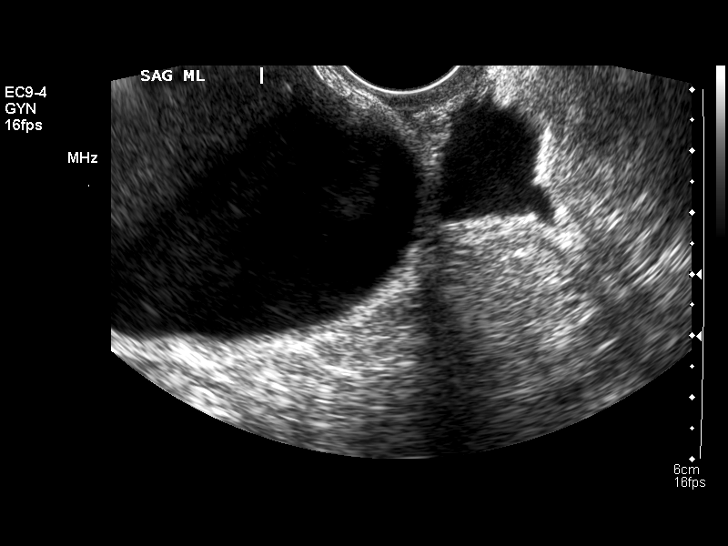

[13 of 25 positions shown; findings below may reference images not displayed]

FINDINGS: Uterus is surgically absent.  A normal vaginal cuff is noted

Endometrium not applicable

Right Ovary is surgically absent

Left Ovary is surgically absent

Other Findings:  In the left adnexa there is a simple fluid
collection identified which measures 4.1 x 3.6 x 3.7 cm.  This does
not have a clearly defined surrounding rim and sonographically
appears to represent free fluid.  This may represent residua from
the patient's prior area of aspiration on 06/11/2009 and result
from a collapsed loculated area of fluid.  No echoes are identified
within the fluid and no internal septations are seen to suggest
this is complex in nature.

Pelvic masses are noted
IMPRESSION: Moderate amount of simple fluid in the left adnexa as described
above.  This may correlate with some residual fluid from the area
of prior aspiration.  No features are present to suggest this
contains protein or infected fluid.

## 2011-03-18 ENCOUNTER — Other Ambulatory Visit: Payer: Self-pay | Admitting: Internal Medicine

## 2011-03-18 DIAGNOSIS — R921 Mammographic calcification found on diagnostic imaging of breast: Secondary | ICD-10-CM

## 2011-03-22 ENCOUNTER — Ambulatory Visit (INDEPENDENT_AMBULATORY_CARE_PROVIDER_SITE_OTHER): Payer: Medicare Other | Admitting: Internal Medicine

## 2011-03-22 ENCOUNTER — Encounter: Payer: Self-pay | Admitting: Internal Medicine

## 2011-03-22 VITALS — BP 134/72 | HR 95 | Temp 98.4°F | Ht 67.0 in | Wt 167.4 lb

## 2011-03-22 DIAGNOSIS — G629 Polyneuropathy, unspecified: Secondary | ICD-10-CM

## 2011-03-22 DIAGNOSIS — Z23 Encounter for immunization: Secondary | ICD-10-CM

## 2011-03-22 DIAGNOSIS — E785 Hyperlipidemia, unspecified: Secondary | ICD-10-CM

## 2011-03-22 DIAGNOSIS — E039 Hypothyroidism, unspecified: Secondary | ICD-10-CM

## 2011-03-22 DIAGNOSIS — G609 Hereditary and idiopathic neuropathy, unspecified: Secondary | ICD-10-CM

## 2011-03-22 NOTE — Assessment & Plan Note (Signed)
Lab Results  Component Value Date   TSH 1.62 01/28/2011   The current medical regimen is effective;  continue present plan and medications.

## 2011-03-22 NOTE — Progress Notes (Signed)
  Subjective:    Patient ID: [name], female    DOB: [birth year], 77 y.o.   MRN: [id number]  HPI Here for follow up test results Continued leg symptoms described as "cool and numb" rather than pain - intermittent "not bad now" No back pain  ?re statin meds  Past Medical History  Diagnosis Date  . Allergic rhinitis   . COPD (chronic obstructive pulmonary disease)   . BC (bronchogenic carcinoma) 2005    Resected RUL  . Pelvic cyst   . Bladder atony   . Unspecified hypothyroidism   . Hyperlipidemia     Review of Systems  Constitutional: Negative for fever and fatigue.  Respiratory: Negative for cough and choking.   Cardiovascular: Negative for chest pain and leg swelling.  Musculoskeletal: Negative for joint swelling and gait problem.       Objective:   Physical Exam BP 134/72  Pulse 95  Temp(Src) 98.4 F (36.9 C) (Oral)  Ht 5\' 7"  (1.702 m)  Wt 167 lb 6.4 oz (75.932 kg)  BMI 26.22 kg/m2  SpO2 95% Wt Readings from Last 3 Encounters:  03/22/11 167 lb 6.4 oz (75.932 kg)  02/03/11 168 lb 12.8 oz (76.567 kg)  01/14/11 171 lb (77.565 kg)   Constitutional: [name] appears well-developed and well-nourished. No distress. Dtr at side Neck: Normal range of motion. Neck supple. No JVD present. No thyromegaly present.  Cardiovascular: Normal rate, regular rhythm and normal heart sounds.  No murmur heard. No BLE edema. Pulmonary/Chest: Effort normal and breath sounds diminished. No respiratory distress. [name] has no wheezes. Musculoskeletal: Back: full range of motion of thoracic and lumbar spine. Non tender to palpation. Negative straight leg raise. DTR's are symmetrically intact. Sensation intact in all dermatomes of the lower extremities. Full strength to manual muscle testing  - able to heel toe walk without difficulty and ambulates with normal gait. Skin: Skin is warm and dry. No rash noted. No erythema. + petechia and ecchymoses   Psychiatric: [name] has a normal mood and  affect. Her behavior is normal. Judgment and thought content normal.   Lab Results  Component Value Date   WBC 8.1 01/28/2011   HGB 13.5 01/28/2011   HCT 40.8 01/28/2011   PLT 198.0 01/28/2011   CHOL 202* 01/28/2011   TRIG 92.0 01/28/2011   HDL 121.20 01/28/2011   LDLDIRECT 66.9 01/28/2011   ALT 32 01/28/2011   AST 34 01/28/2011   NA 138 01/28/2011   K 3.8 01/28/2011   CL 99 01/28/2011   CREATININE 0.7 01/28/2011   BUN 6 01/28/2011   CO2 32 01/28/2011   TSH 1.62 01/28/2011   INR 0.94 02/05/2010   HGBA1C 5.4 01/28/2011      Assessment & Plan:  See problem list. Medications and labs reviewed today.

## 2011-03-22 NOTE — Assessment & Plan Note (Addendum)
Prev on Lipitor - stopped due to myalgia - increase lipids prompted resume of med tx - On crestor - inc muscle fatigue so decrease dose (protected by high HDL) Consider resume Lipitor if $$ issues

## 2011-03-22 NOTE — Patient Instructions (Signed)
It was good to see you today. We have reviewed your prior records including labs and tests today Medications reviewed, reduce dose of Crestor to 10mg  (1/2 tab 20mg ) daily - no other changes for now Flu shot today Please schedule followup in 3-4 months, call sooner if problems.

## 2011-03-22 NOTE — Assessment & Plan Note (Signed)
Labs and NCS results reviewed 01/2011: indicates no periph neuropathy but L>R S1 radiculopathy Consider additional imaging if increasing or recurrent leg symptoms but little concern to pt at this time Time spent with pt/family today 25 minutes, greater than 50% time spent counseling patient on test results and medication review. Also review of prior records

## 2011-03-24 NOTE — Progress Notes (Signed)
Quick Note:  Pt aware of results. ______ 

## 2011-04-04 ENCOUNTER — Ambulatory Visit
Admission: RE | Admit: 2011-04-04 | Discharge: 2011-04-04 | Disposition: A | Discharge status: Home / Self Care | Payer: Medicare Other | Source: Ambulatory Visit | Attending: Internal Medicine | Admitting: Internal Medicine

## 2011-04-04 DIAGNOSIS — R921 Mammographic calcification found on diagnostic imaging of breast: Secondary | ICD-10-CM

## 2011-04-06 IMAGING — US US PELVIS COMPLETE
1 series · 13 of 25 positions shown · non-contrast
Comparison: [DATE] and [DATE]

CLINICAL DATA: Pelvic pain with standing and walking for 1 month.
Post total abdominal hysterectomy.  Cystic pelvic structure
aspirated and May 2010.

TRANSABDOMINAL AND TRANSVAGINAL ULTRASOUND OF PELVIS
TECHNIQUE: Both transabdominal and transvaginal ultrasound
examinations of the pelvis were performed including evaluation of
the uterus, ovaries, adnexal regions, and pelvic cul-de-sac.

[Series 1: us pelvis complete · 0.42mm/px · 41 acquisitions, 13 frames shown]
[im 1/41]
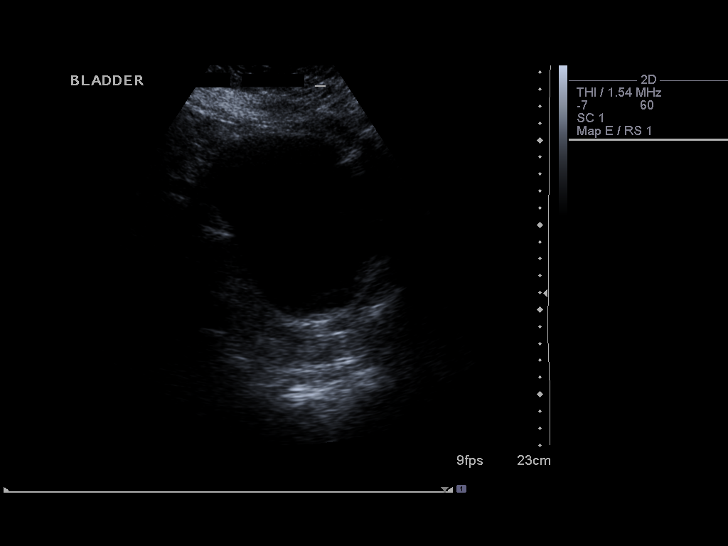
[im 4/41]
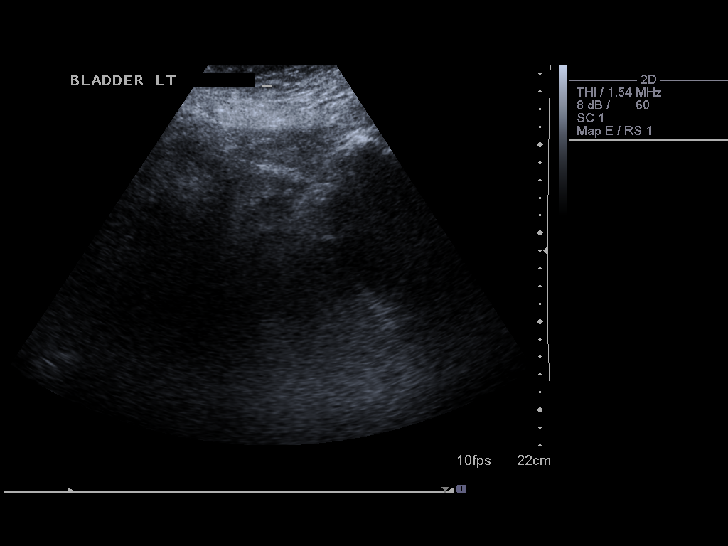
[im 7/41]
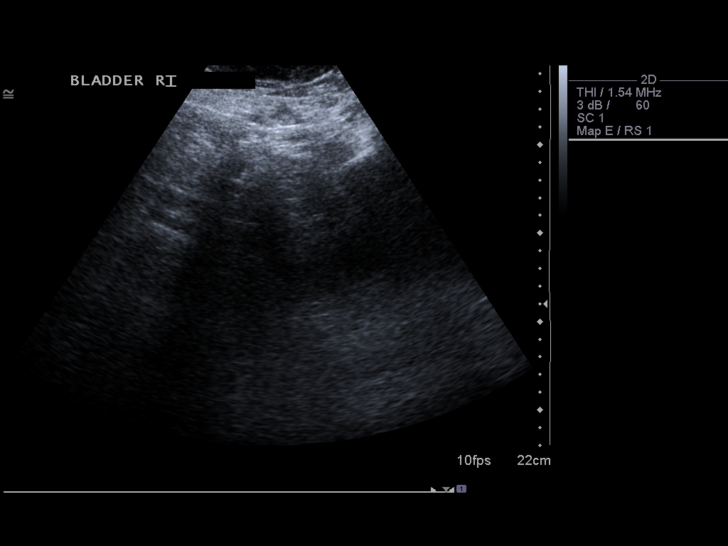
[im 11/41]
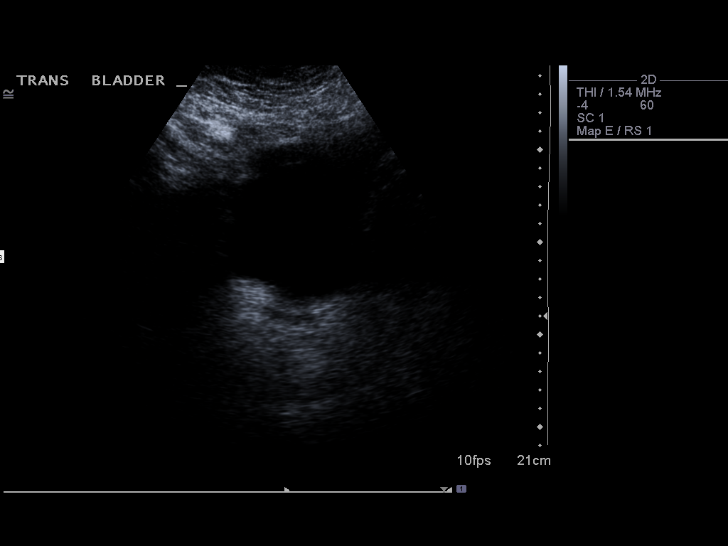
[im 14/41]
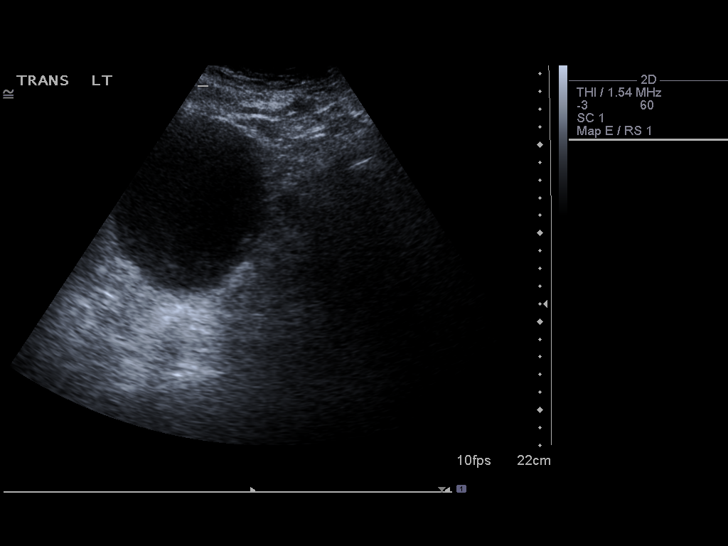
[im 17/41]
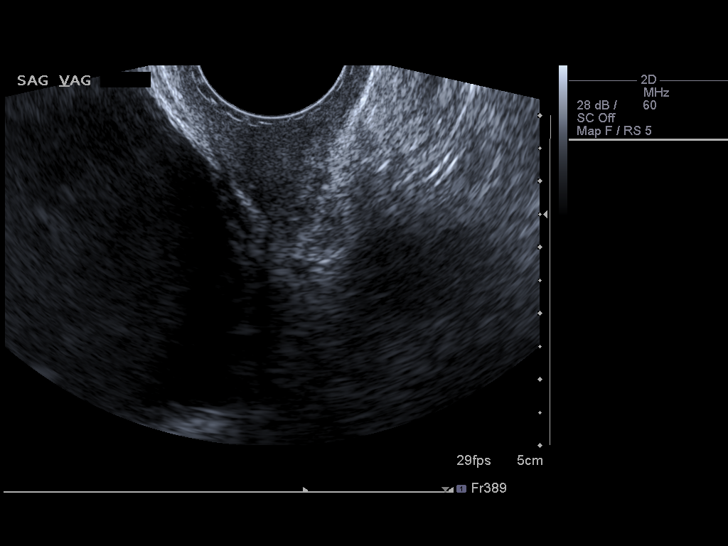
[im 21/41]
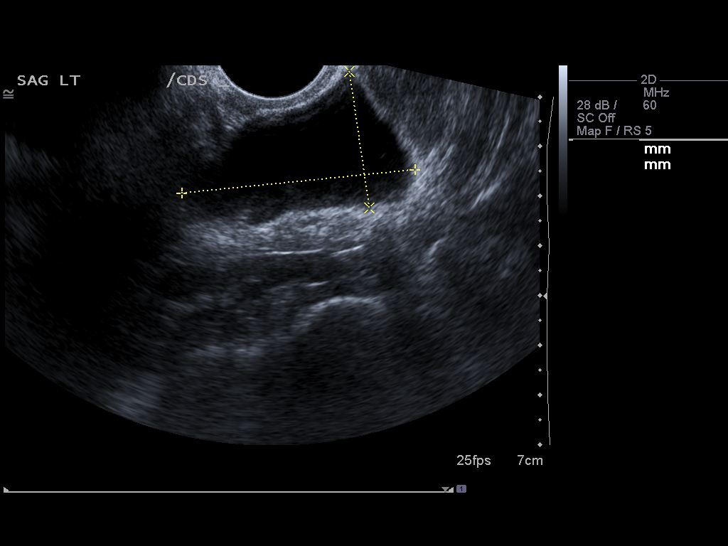
[im 24/41]
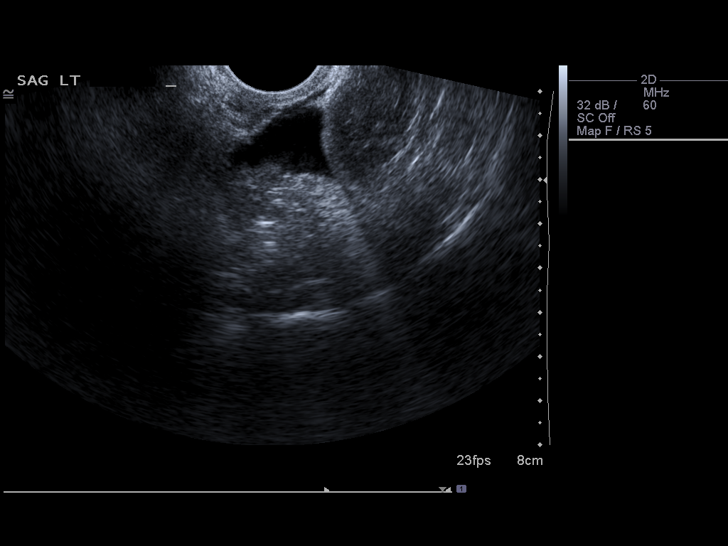
[im 27/41]
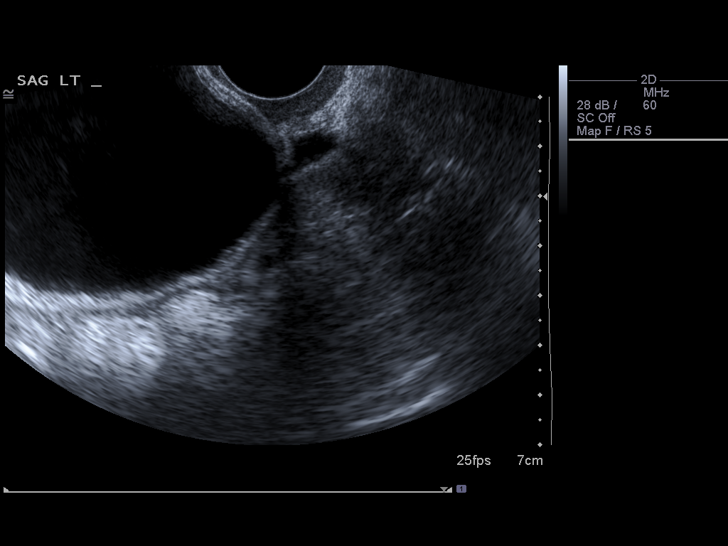
[im 31/41]
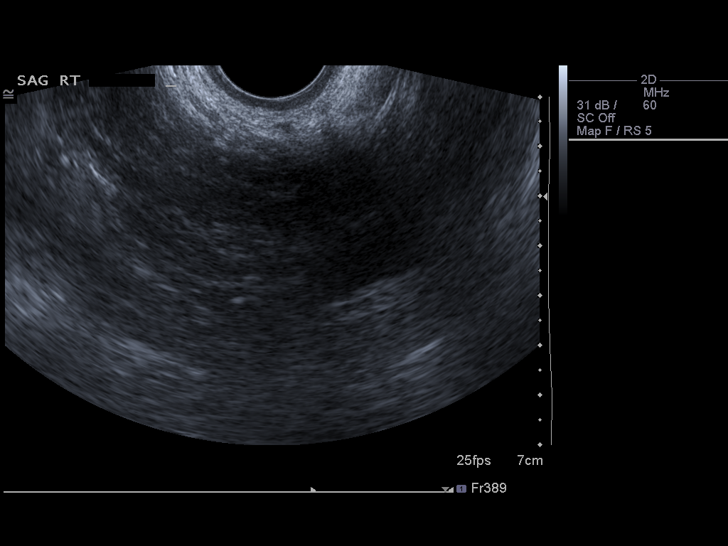
[im 34/41]
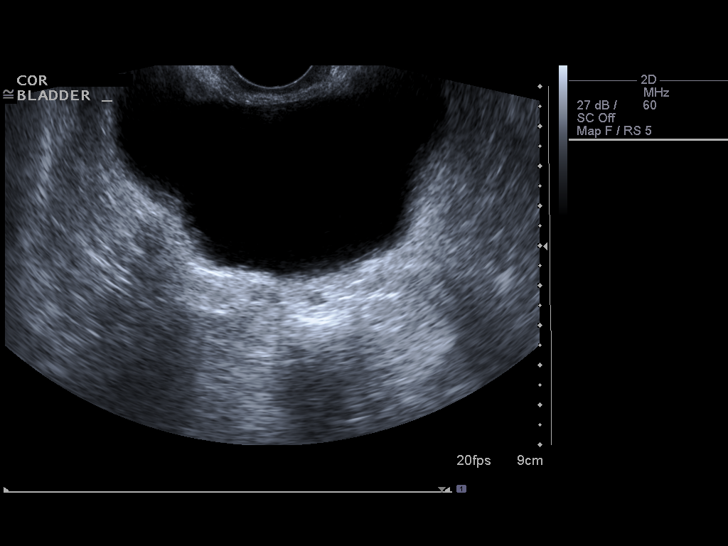
[im 37/41]
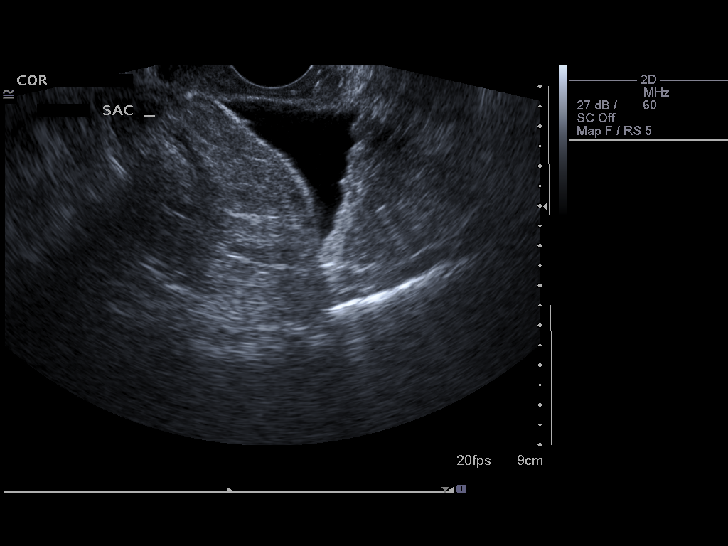
[im 41/41]
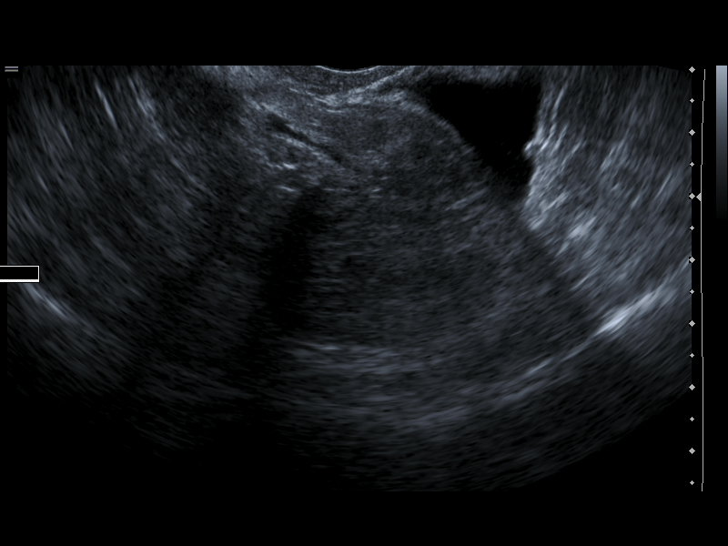

[13 of 25 positions shown; findings below may reference images not displayed]

FINDINGS: Uterus is surgically absent.  A normal vaginal cuff is seen.

Endometrium not applicable

Right Ovary surgically absent

Left Ovary surgically absent

Other Findings:  There is a moderate amount of simple free fluid
noted in the cul-de-sac extending into the left adnexal region
measuring 3.4 x 4.7 x 2.8 cm.   This may represent residua from the
previously aspirated cystic structure  could represent a collapsed
loculated area of fluid, although the fluid appears to correspond
to the position of the adjacent structures and may be free in
nature.  The overall volume is similar to that seen on the prior
exam on the shape has changed somewhat. No internal echoes are
identified and no thickening of the surrounding wall are seen to
suggest associated infection sonographically.

No pelvic masses are seen
IMPRESSION: Persistent pelvic fluid collection which is largely unchanged in
volume in comparison with the prior exam.  This may be residual
fluid from the patient's prior aspiration.  No definite adverse
features are noted.

It should be noted that the patient's bladder appears quite full on
the endovaginal exam and may correspond with an inability to
completely empty the bladder.

## 2011-04-19 ENCOUNTER — Telehealth: Payer: Self-pay | Admitting: Internal Medicine

## 2011-04-19 NOTE — Telephone Encounter (Signed)
Per CY-okay to resume the QVAR she had before and give prn refills.

## 2011-04-19 NOTE — Telephone Encounter (Signed)
I spoke with the pt and she states that Dr. Yetta Barre placed her on Symbicort in July 2012. She is about to run out of the medication and wants to know if it is ok to go back on qvar. She states she has some samples left of qvar from when she was on it previously. She states she does not feel like symbicort worked as well for her as qvar. Please advise if ok for pt to go abck on qvar. Thanks. Carron Curie, CMA Allergies  Allergen Reactions  . Daliresp (Roflumilast) Nausea Only  . Spiriva Handihaler     Urinary retention  . Ampicillin     REACTION: GI upset  . Azithromycin     REACTION: diarrhea  . Codeine   . Nitrofurantoin     REACTION: Upset GI  . Penicillins   . Sulfonamide Derivatives

## 2011-04-19 NOTE — Telephone Encounter (Signed)
Pt is aware of okay to go back on QVAR 80 per CY and has appt on 05-24-11 at 2 pm with CY to follow up visit.

## 2011-04-22 ENCOUNTER — Telehealth: Payer: Self-pay | Admitting: *Deleted

## 2011-04-22 NOTE — Telephone Encounter (Signed)
Message copied by Vernie Murders on Fri Apr 22, 2011  5:59 PM ------      Message from: Rene Paci A      Created: Thu Jan 13, 2011  9:56 PM      Regarding: RE: NEW PATIENT       Ok with me if ok with TJ            ----- Message -----         From: Lamar Sprinkles, CMA         Sent: 01/13/2011   4:14 PM           To: Rene Paci, MD      Subject: NEW PATIENT                                              Patient requesting to transfer care from Dr Yetta Barre to Dr Felicity Coyer.

## 2011-04-26 IMAGING — CR DG CHEST 2V
2 series · 2 of 2 positions shown · non-contrast
Comparison: Chest x-ray of 05/25/2009

CLINICAL DATA: Cough, wheezing, history of lung carcinoma

CHEST - 2 VIEW

[view not recorded (1 of 2)]
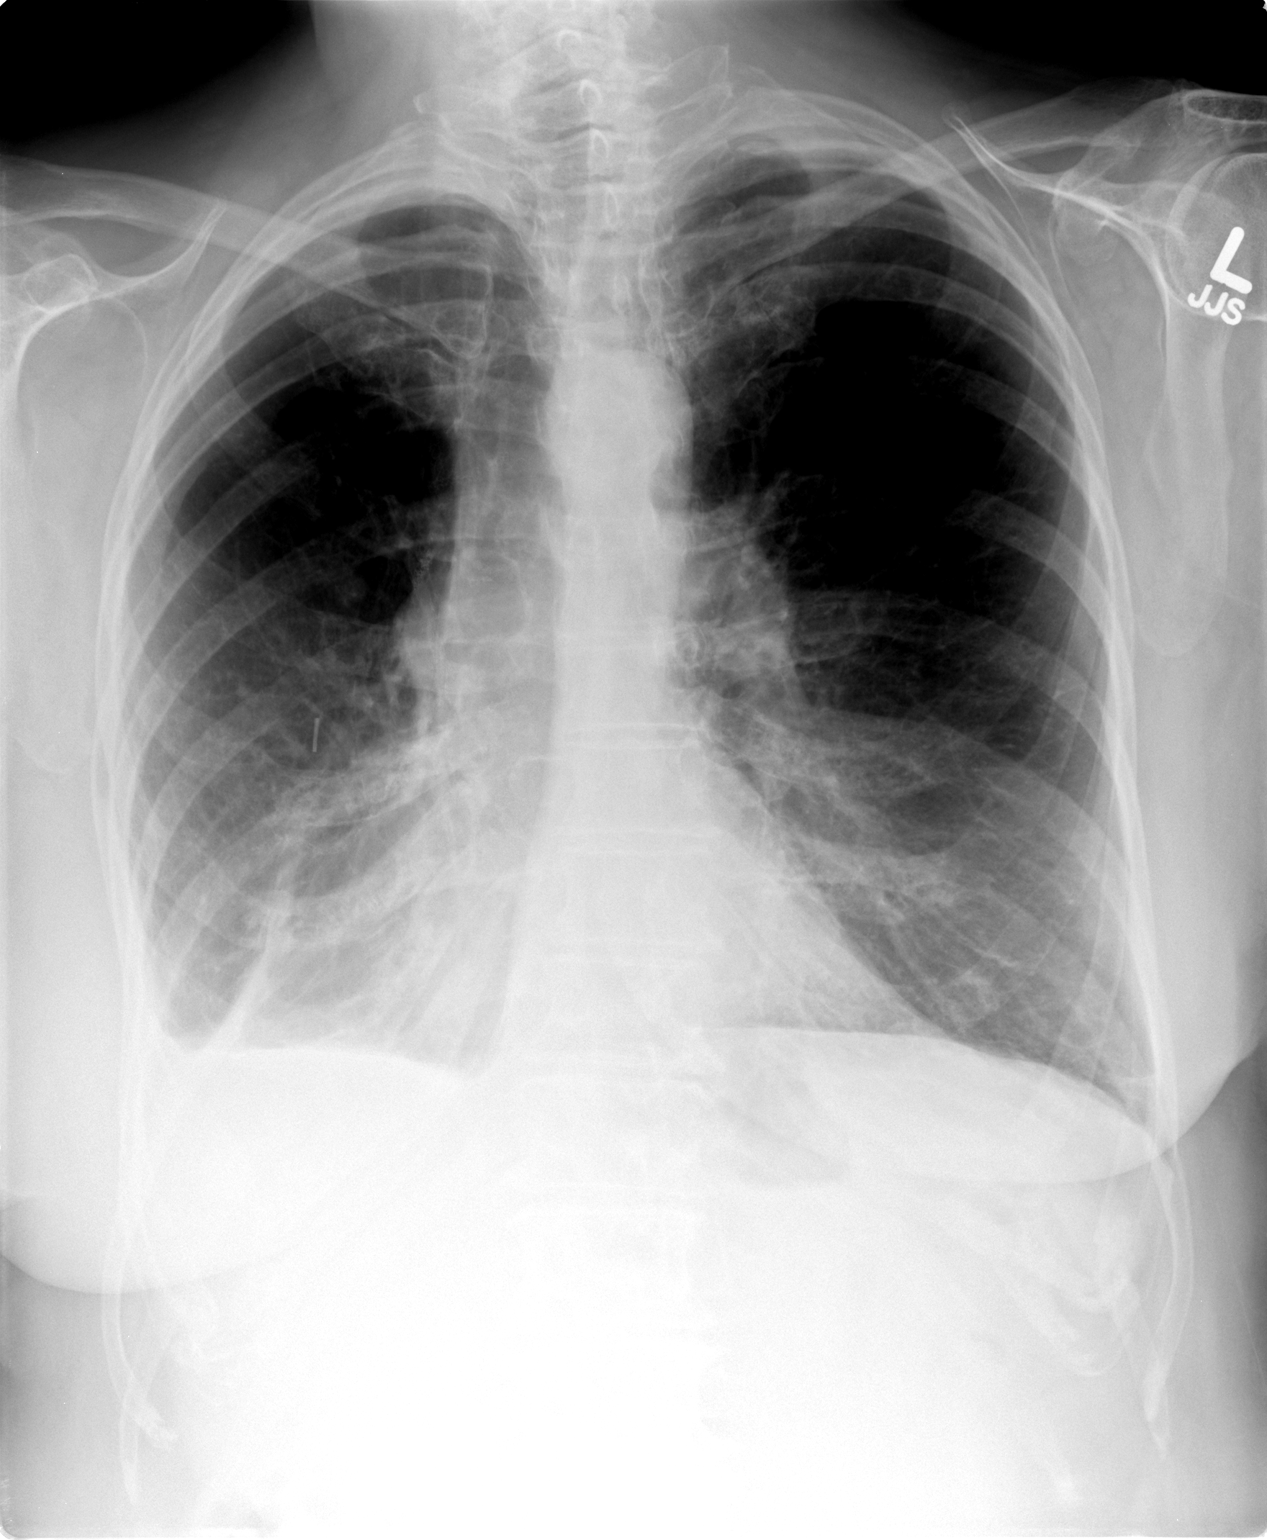

[view not recorded (2 of 2)]
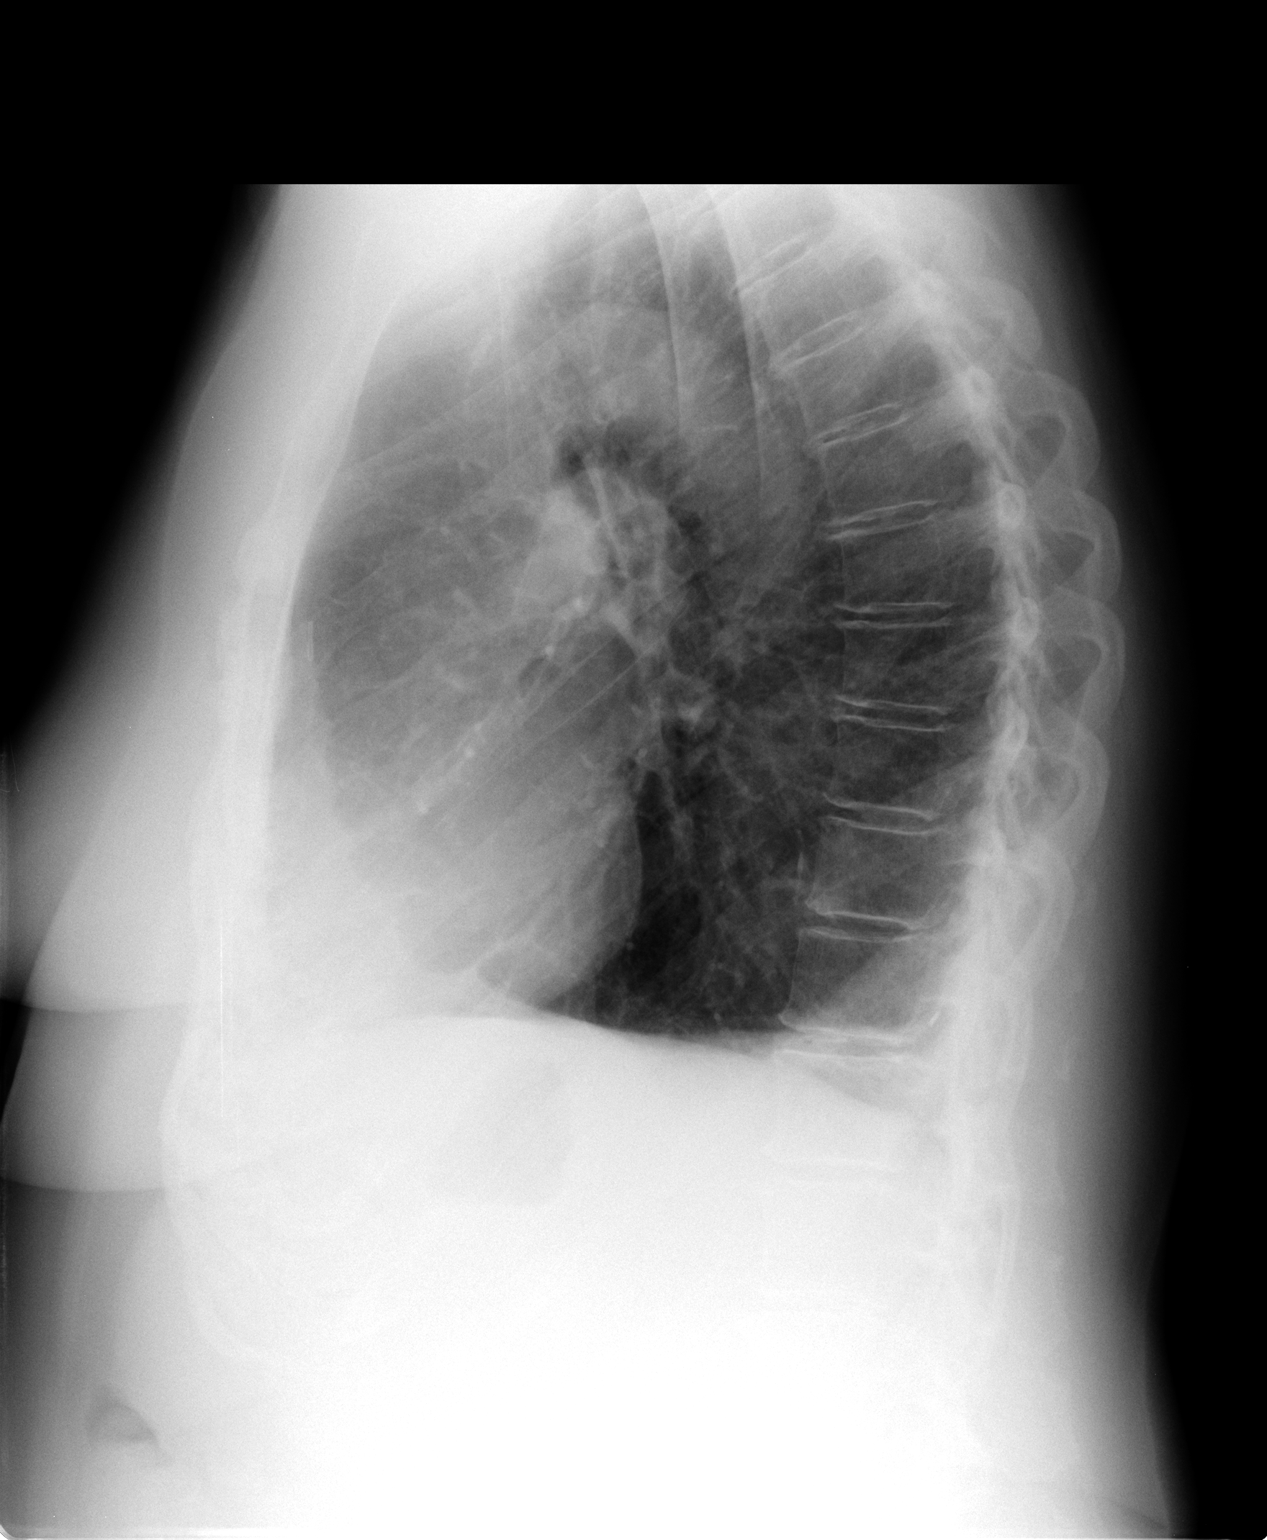

[2 of 2 positions shown; findings below may reference images not displayed]

FINDINGS: Pleuroparenchymal scarring at the right lung base appears
stable. A poorly defined nodular past in the right mid lung appears
stable compared to prior chest x-ray. The left lung is clear.
Mediastinal contours are stable.  The heart is within normal limits
in size.  No bony abnormalities seen.
IMPRESSION: Stable pleural and parenchymal scarring at the right lung base.  No
active process.

## 2011-05-11 ENCOUNTER — Telehealth: Payer: Self-pay | Admitting: *Deleted

## 2011-05-11 MED ORDER — ALPRAZOLAM 0.5 MG PO TABS: 1.0000 mg | ORAL_TABLET | Freq: Every evening | ORAL | Status: DC | PRN

## 2011-05-11 NOTE — Telephone Encounter (Signed)
30 day supply with 5 refills to send to her local pharmacy

## 2011-05-11 NOTE — Telephone Encounter (Signed)
Notified pt md ok refills faxed [facility]...05/11/11@4 :49pm/LMB

## 2011-05-11 NOTE — Telephone Encounter (Signed)
Haven't received mail order on her alprazolam since July. Pt states she is canceling services with them because she can never speak with anyone. Pt states she is completely out requesting md to [facility]....05/11/11@3 :30pm/LMB

## 2011-05-24 ENCOUNTER — Ambulatory Visit (INDEPENDENT_AMBULATORY_CARE_PROVIDER_SITE_OTHER): Payer: Medicare Other | Admitting: Internal Medicine

## 2011-05-24 ENCOUNTER — Encounter: Payer: Self-pay | Admitting: Internal Medicine

## 2011-05-24 ENCOUNTER — Ambulatory Visit (INDEPENDENT_AMBULATORY_CARE_PROVIDER_SITE_OTHER)
Admission: RE | Admit: 2011-05-24 | Discharge: 2011-05-24 | Disposition: A | Discharge status: Home / Self Care | Payer: Medicare Other | Source: Ambulatory Visit | Attending: Internal Medicine | Admitting: Internal Medicine

## 2011-05-24 ENCOUNTER — Other Ambulatory Visit: Payer: Self-pay | Admitting: Internal Medicine

## 2011-05-24 VITALS — BP 120/72 | HR 98 | Ht 65.0 in | Wt 171.8 lb

## 2011-05-24 DIAGNOSIS — J441 Chronic obstructive pulmonary disease with (acute) exacerbation: Secondary | ICD-10-CM

## 2011-05-24 DIAGNOSIS — J40 Bronchitis, not specified as acute or chronic: Secondary | ICD-10-CM

## 2011-05-24 MED ORDER — DOXYCYCLINE HYCLATE 100 MG PO TABS: ORAL_TABLET | ORAL | Status: DC

## 2011-05-24 MED ORDER — LEVALBUTEROL HCL 0.63 MG/3ML IN NEBU
0.6300 mg | INHALATION_SOLUTION | Freq: Once | RESPIRATORY_TRACT | Status: AC
  Administered 2011-05-24: 0.63 mg via RESPIRATORY_TRACT

## 2011-05-24 MED ORDER — METHYLPREDNISOLONE ACETATE 80 MG/ML IJ SUSP
80.0000 mg | Freq: Once | INTRAMUSCULAR | Status: AC
  Administered 2011-05-24: 80 mg via INTRAMUSCULAR

## 2011-05-24 NOTE — Patient Instructions (Signed)
Neb xop 0.63  Depo 80  Script doxycycline antibiotic  Continue Qvar for now. We can decide later whether to continue that or go back to Symbicort.  Order CXR

## 2011-05-24 NOTE — Progress Notes (Signed)
Patient ID: [name], female    DOB: 21-May-1933, 77 y.o.   MRN: [id number]  HPI 816/12-74 year old female former smoker followed for history of right upper lobe cancer, COPD, allergic rhinitis  Here with husband Last here -August 05, 2010- note reviewed Increased  coughing 2 weeks, but struggling all summer. Better at beach. Here few days ago to see Dr Yetta Barre.   Had significantly productive cough- purulent, sometimes  with black spots. Uses nebulizer 2-3x/day, O2 2l/ [employer]. On Avelox now with Symbicort from Dr Yetta Barre. Mouth is getting sore. Still actively wheezing and short of breath.  CXR December 07, 2010- clear except old surgical changes.  She is not sure if she began this flare with cold or allergy. Low fever once or twice in past month. CT w/cm- 12/21/10- normal heart size coronary calcification. Prior right upper lobectomy. Stable right millimeter nodule at right minor fissure. No suspicious nodules. Centrilobular emphysema.  05/24/11- 77 year old female former smoker followed for history of right upper lobe cancer, COPD, allergic rhinitis. Here with husband Has had flu shot. She was feeling well until one week ago when she developed sore throat, temperature to 99.7, chills and malaise, cough productive of purulent blood-streaked sputum. Has home nebulizer being used only once daily with Xopenex. She denies swollen glands, fluid retention, GI upset or chest pain.  SATURATION QUALIFICATIONS: Patient Saturations on Room Air while Ambulating = 85% Patient Saturations on 2 Liters of oxygen while Ambulating = 96%[name],CMA   Review of Systems Constitutional:   No-   weight loss, night sweats, , chills, fatigue, lassitude. HEENT:   No-  headaches, difficulty swallowing, tooth/dental problems, +sore throat,       No-  sneezing, itching, ear ache, +nasal congestion, post nasal drip,  CV:  No-   chest pain, orthopnea, PND, swelling in lower extremities, anasarca,  dizziness, palpitations Resp:    +shortness of breath with exertion or at rest.                 +productive cough,   non-productive cough,    coughing up of blood- trace.              some   change in color of mucus.    Skin: No-   rash or lesions. GI:  No-   heartburn, indigestion, abdominal pain, nausea, vomiting, diarrhea,                 change in bowel habits, loss of appetite GU: No-   dysuria, change in color of urine, no urgency or frequency.  No- flank pain. MS:  No-   joint pain or swelling.  No- decreased range of motion.  No- back pain. Neuro- grossly normal to observation, Or:  Psych:  No- change in mood or affect. No depression or anxiety.  No memory loss.     Objective:   Physical Exam General- Alert, Oriented, Affect-appropriate, Distress- none acute, but   ill appearing ;  O2 2L  96% at rest Skin- rash-none, lesions- none, excoriation- none Lymphadenopathy- none Head- atraumatic            Eyes- Gross vision intact, PERRLA, conjunctivae clear secretions            Ears- Hearing, canals normal            Nose- Clear, no-Septal dev, mucus, polyps, erosion, perforation             Throat- Mallampati IV , mucosa clear ,  drainage- none, tonsils- atrophic Neck- flexible , trachea midline, no stridor , thyroid nl, carotid no bruit Chest - symmetrical excursion , unlabored           Heart/CV- RRR , no murmur , no gallop  , no rub, nl s1 s2                           - JVD- none , edema- none, stasis changes- none, varices- none           Lung- bilateral wheeze, harsh cough , dullness-none, rub- none           Chest wall-  Abd- tender-no, distended-no, bowel sounds-present, HSM- no Br/ Gen/ Rectal- Not done, not indicated Extrem- cyanosis- none, clubbing, none, atrophy- none, strength- nl Neuro- grossly intact to observation

## 2011-05-27 ENCOUNTER — Telehealth: Payer: Self-pay | Admitting: Internal Medicine

## 2011-05-27 NOTE — Assessment & Plan Note (Signed)
Acute bronchitic exacerbation of COPD. Both with the blood reflects hemorrhagic bronchitis. Plan-chest x-ray, doxycycline, nebulizer Xopenex, Depo-Medrol 80 mg, continue Qvar

## 2011-05-27 NOTE — Telephone Encounter (Signed)
Spoke with pt and notified of cxr results per Dr Maple Hudson. She verbalized understanding. States that she is only some better since last ov. Still has prod cough with yellow sputum. No hemoptysis. SOB the same. No fever. She states that she is taking her doxy as prescribed. I advised will call back on Monday 03/31/11 if not improving and also to seek emergent care over the w/e if worsens. Pt verbalized understanding and agrees with this plan.

## 2011-06-01 NOTE — Telephone Encounter (Signed)
Pt states she took the last dose of Doxy on Tues., 05/31/11. Her sputum is now clear but cough and congestion is not gone but better. Pt verbalized understanding that the abx will continue to work in her system for several more days. She said that for now, she will wait to see how she feels in a few days and if not continuing to improve she will call back to leave a new msg.

## 2011-06-02 ENCOUNTER — Telehealth: Payer: Self-pay | Admitting: Pulmonary Disease

## 2011-06-02 MED ORDER — BECLOMETHASONE DIPROPIONATE 80 MCG/ACT IN AERS: 2.0000 | INHALATION_SPRAY | Freq: Two times a day (BID) | RESPIRATORY_TRACT | Status: DC

## 2011-06-02 NOTE — Telephone Encounter (Signed)
Ok to refill Qvar 80     # 1, refill prn     2 puffs and rinse, twice daily

## 2011-06-02 NOTE — Telephone Encounter (Signed)
Patient finished antibiotic also wants refill for Qvar or Symbicort. Please advise. Thanks, sara

## 2011-06-02 NOTE — Telephone Encounter (Signed)
Pt aware of Rx sent to [facility] [address] for 1 month supply at a time until patient can figure out who to use for 90 day supply.

## 2011-06-27 ENCOUNTER — Ambulatory Visit (INDEPENDENT_AMBULATORY_CARE_PROVIDER_SITE_OTHER)
Admission: RE | Admit: 2011-06-27 | Discharge: 2011-06-27 | Disposition: A | Discharge status: Home / Self Care | Payer: Medicare Other | Source: Ambulatory Visit | Attending: Internal Medicine | Admitting: Internal Medicine

## 2011-06-27 ENCOUNTER — Telehealth: Payer: Self-pay | Admitting: Internal Medicine

## 2011-06-27 ENCOUNTER — Other Ambulatory Visit (INDEPENDENT_AMBULATORY_CARE_PROVIDER_SITE_OTHER): Payer: Medicare Other

## 2011-06-27 DIAGNOSIS — R042 Hemoptysis: Secondary | ICD-10-CM

## 2011-06-27 LAB — CBC WITH DIFFERENTIAL/PLATELET
Basophils Relative: 1.3 % (ref 0.0–3.0)
Eosinophils Relative: 0.8 % (ref 0.0–5.0)
Lymphocytes Relative: 19.5 % (ref 12.0–46.0)
MCV: 98.3 fl (ref 78.0–100.0)
Monocytes Absolute: 0.7 10*3/uL (ref 0.1–1.0)
Monocytes Relative: 8.6 % (ref 3.0–12.0)
Neutrophils Relative %: 69.8 % (ref 43.0–77.0)
RBC: 3.9 Mil/uL (ref 3.87–5.11)
WBC: 8 10*3/uL (ref 4.5–10.5)

## 2011-06-27 NOTE — Telephone Encounter (Signed)
I spoke with pt and she c/o cough w/ white phkem mixed with string bright red blood in it x 2 days, chest hurts when she breathes, nasal congestion, PND, nausea but no vomiting. Pt denies ant fever, chills, sweats, body aches. Pt states she took mucniex 2 days ago and when she did that is when her phlem turned form brown to white w/ blood in it. Pt is scheduled to come in and see CDY tomorrow at 2:15 but is requesting to be seen today if possible. Please advise Dr. Maple Hudson, thanks  Allergies  Allergen Reactions  . Daliresp (Roflumilast) Nausea Only  . Spiriva Handihaler     Urinary retention  . Ampicillin     REACTION: GI upset  . Azithromycin     REACTION: diarrhea  . Codeine   . Nitrofurantoin     REACTION: Upset GI  . Penicillins   . Sulfonamide Derivatives

## 2011-06-27 NOTE — Telephone Encounter (Signed)
Per CY-okay to have patient come by today and do CXR and CBC-diff; keep appt with CY tomorrow. If gets worse through the day or night go to closest ER or Urgent Care.   CBC-DIFF CXR  Dx hemoptysis

## 2011-06-27 NOTE — Telephone Encounter (Signed)
I spoke with pt and is aware of cdy recs. Pt states she was going to go ahead and come to have cxr and lab done. Pt aware if she worsens then she needs to go to ED or UC. She voiced her understanding. Orders have been placed.

## 2011-06-28 ENCOUNTER — Ambulatory Visit (INDEPENDENT_AMBULATORY_CARE_PROVIDER_SITE_OTHER): Payer: Medicare Other | Admitting: Internal Medicine

## 2011-06-28 ENCOUNTER — Encounter: Payer: Self-pay | Admitting: Internal Medicine

## 2011-06-28 VITALS — BP 132/68 | HR 82 | Ht 65.0 in | Wt 171.0 lb

## 2011-06-28 DIAGNOSIS — J309 Allergic rhinitis, unspecified: Secondary | ICD-10-CM

## 2011-06-28 DIAGNOSIS — J441 Chronic obstructive pulmonary disease with (acute) exacerbation: Secondary | ICD-10-CM

## 2011-06-28 MED ORDER — DOXYCYCLINE HYCLATE 100 MG PO TABS: ORAL_TABLET | ORAL | Status: DC

## 2011-06-28 MED ORDER — METHYLPREDNISOLONE ACETATE 80 MG/ML IJ SUSP
80.0000 mg | Freq: Once | INTRAMUSCULAR | Status: AC
  Administered 2011-06-28: 80 mg via INTRAMUSCULAR

## 2011-06-28 NOTE — Progress Notes (Signed)
Patient ID: [name], female    DOB: September 21, 1932, 78 y.o.   MRN: [id number]  HPI 816/12-79 year old female former smoker followed for history of right upper lobe cancer, COPD, allergic rhinitis  Here with husband Last here -August 05, 2010- note reviewed Increased  coughing 2 weeks, but struggling all summer. Better at beach. Here few days ago to see Dr Yetta Barre.   Had significantly productive cough- purulent, sometimes  with black spots. Uses nebulizer 2-3x/day, O2 2l/ [employer]. On Avelox now with Symbicort from Dr Yetta Barre. Mouth is getting sore. Still actively wheezing and short of breath.  CXR December 07, 2010- clear except old surgical changes.  She is not sure if she began this flare with cold or allergy. Low fever once or twice in past month. CT w/cm- 12/21/10- normal heart size coronary calcification. Prior right upper lobectomy. Stable right millimeter nodule at right minor fissure. No suspicious nodules. Centrilobular emphysema.  05/24/11- 78 year old female former smoker followed for history of right upper lobe cancer, COPD, allergic rhinitis. Here with husband Has had flu shot. She was feeling well until one week ago when she developed sore throat, temperature to 99.7, chills and malaise, cough productive of purulent blood-streaked sputum. Has home nebulizer being used only once daily with Xopenex. She denies swollen glands, fluid retention, GI upset or chest pain.  06/28/11- 78 year old female former smoker followed for history of right upper lobe cancer, COPD, allergic rhinitis. Here with husband Reports- wheezing, chest congestion, runny nose, cough with green/red mucus tinged with blood.  She got well after last visit and been sick again. Did have flu vaccine. Now 3 or 4 days of rhinorrhea, chills, productive cough with discolored sputum. 3 days ago had brief episode of pink in sputum with hard cough and. Cough and sputum production are less today. Occasional tussive soreness  at the xiphoid. Frequent heartburn. Doxycycline and seemed to help but is completed now. Does have home nebulizer.  Had chest x-ray January 7: *RADIOLOGY REPORT* 06/27/11- Clinical Data: 78 year old with hemoptysis.  CHEST - 2 VIEW  Comparison: Chest radiograph 05/24/2011  Findings: Chronic volume loss and postoperative changes in the  right hemithorax. The upper lungs are clear with increased  lucency. Findings suggest emphysematous changes. Stable linear  density at the right lung base is suggestive for scar. The heart  and mediastinum are stable. Slightly increased interstitial  densities in the right mid chest are similar to the prior  examination. No focal airspace disease.  IMPRESSION:  Stable chest radiograph findings. No acute changes.  Original Report Authenticated By: Richarda Overlie, M.D.    Review of Systems Constitutional:   No-   weight loss, night sweats, , chills, fatigue, lassitude. HEENT:   No-  headaches, difficulty swallowing, tooth/dental problems, +sore throat,     No-  sneezing, itching, ear ache, +nasal congestion, post nasal drip,  CV:  +  chest pain, orthopnea, PND, swelling in lower extremities, anasarca, dizziness, palpitations Resp:    +shortness of breath with exertion or at rest.                 +productive cough,   non-productive cough,    coughing up of blood- trace.              some   change in color of mucus.    Skin: No-   rash or lesions. GI:  No-   heartburn, indigestion, abdominal pain, nausea, vomiting, diarrhea,  change in bowel habits, loss of appetite GU:  MS:  No-   joint pain or swelling.  No- decreased range of motion.  No- back pain. Neuro- grossly normal to observation, Or:  Psych:  No- change in mood or affect. No depression or anxiety.  No memory loss.     Objective:   Physical Exam General- Alert, Oriented, Affect-appropriate, Distress- none acute, but   ill appearing ;  O2 2L  96% at rest Skin- rash-none, lesions- none,  excoriation- none Lymphadenopathy- none Head- atraumatic            Eyes- Gross vision intact, PERRLA, conjunctivae clear secretions            Ears- Hearing, canals normal            Nose- Clear, no-Septal dev, mucus, polyps, erosion, perforation             Throat- Mallampati IV , mucosa clear , drainage- none, tonsils- atrophic Neck- flexible , trachea midline, no stridor , thyroid nl, carotid no bruit Chest - symmetrical excursion , unlabored           Heart/CV- RRR , no murmur , no gallop  , no rub, nl s1 s2                           - JVD- none , edema- none, stasis changes- none, varices- none           Lung- bilateral wheeze, harsh cough , dullness-none, rub- none           Chest wall-  Abd-  Br/ Gen/ Rectal- Not done, not indicated Extrem- cyanosis- none, clubbing, none, atrophy- none, strength- nl Neuro- grossly intact to observation

## 2011-06-28 NOTE — Progress Notes (Signed)
Quick Note:  Pt aware via visit with CY on 06-28-2011 ______

## 2011-06-28 NOTE — Progress Notes (Signed)
Quick Note:  Pt aware via visit with CY on 06-28-2011 ______ 

## 2011-06-28 NOTE — Patient Instructions (Addendum)
Depo 80  Script doxycycline sent  Please let me know if you don't get better, and especially if you still keep seeing blood in the sputum

## 2011-06-29 ENCOUNTER — Ambulatory Visit (INDEPENDENT_AMBULATORY_CARE_PROVIDER_SITE_OTHER): Payer: Medicare Other | Admitting: Internal Medicine

## 2011-06-29 ENCOUNTER — Encounter: Payer: Self-pay | Admitting: Internal Medicine

## 2011-06-29 ENCOUNTER — Other Ambulatory Visit (INDEPENDENT_AMBULATORY_CARE_PROVIDER_SITE_OTHER): Payer: Medicare Other

## 2011-06-29 VITALS — BP 100/62 | HR 92 | Temp 97.5°F | Wt 169.1 lb

## 2011-06-29 DIAGNOSIS — W19XXXA Unspecified fall, initial encounter: Secondary | ICD-10-CM

## 2011-06-29 DIAGNOSIS — G629 Polyneuropathy, unspecified: Secondary | ICD-10-CM

## 2011-06-29 DIAGNOSIS — E039 Hypothyroidism, unspecified: Secondary | ICD-10-CM

## 2011-06-29 DIAGNOSIS — S0003XA Contusion of scalp, initial encounter: Secondary | ICD-10-CM

## 2011-06-29 DIAGNOSIS — I951 Orthostatic hypotension: Secondary | ICD-10-CM

## 2011-06-29 DIAGNOSIS — G609 Hereditary and idiopathic neuropathy, unspecified: Secondary | ICD-10-CM

## 2011-06-29 DIAGNOSIS — S1093XA Contusion of unspecified part of neck, initial encounter: Secondary | ICD-10-CM

## 2011-06-29 LAB — BASIC METABOLIC PANEL
CO2: 34 mEq/L — ABNORMAL HIGH (ref 19–32)
Chloride: 99 mEq/L (ref 96–112)
Glucose, Bld: 106 mg/dL — ABNORMAL HIGH (ref 70–99)
Potassium: 4.2 mEq/L (ref 3.5–5.1)
Sodium: 140 mEq/L (ref 135–145)

## 2011-06-29 LAB — TSH: TSH: 0.76 u[IU]/mL (ref 0.35–5.50)

## 2011-06-29 NOTE — Progress Notes (Signed)
  Subjective:    Patient ID: [name], female    DOB: December 12, 1932, 78 y.o.   MRN: [id number]  HPI  Here for follow up  Complains of accidental fall this morning Hit head and left arm against wall> no head or eye pain but persisting bruise/swelling over L eye Frequent orthostatic symptoms with position changes Prior fall 06/10/11 after standing from toliet> nose/forehead hit floor falling forward No confusion, vision changes but increasing "unsteady feeling and falling events" No incontinence or seizure - no loss of consciousness before or during fall  Also reviewed chronic medical issues: COPD - follows with pulm for same - wearing O2 qhs - the patient reports compliance with medication(s) as prescribed. Denies adverse side effects.  Dyslipidemia - on statin - the patient reports compliance with medication(s) as prescribed. Denies adverse side effects.  Hypothyroid - the patient reports compliance with medication(s) as prescribed. Denies adverse side effects.   Past Medical History  Diagnosis Date  . Allergic rhinitis   . COPD (chronic obstructive pulmonary disease)   . BC (bronchogenic carcinoma) 2005    Resected RUL  . Pelvic cyst   . Bladder atony   . Unspecified hypothyroidism   . Hyperlipidemia     Review of Systems  Constitutional: Negative for fever and fatigue.  Respiratory: Negative for cough and choking.   Cardiovascular: Negative for chest pain and leg swelling.  Musculoskeletal: Negative for joint swelling and gait problem.       Objective:   Physical Exam  BP 100/62  Pulse 92  Temp(Src) 97.5 F (36.4 C) (Oral)  Wt 169 lb 1.9 oz (76.712 kg)  SpO2 95% Wt Readings from Last 3 Encounters:  06/29/11 169 lb 1.9 oz (76.712 kg)  06/28/11 171 lb (77.565 kg)  05/24/11 171 lb 12.8 oz (77.928 kg)   Constitutional: She appears well-developed and well-nourished. No distress. Dtr at side Neck: Normal range of motion. Neck supple. No JVD present. No  thyromegaly present.  Cardiovascular: Normal rate, regular rhythm and normal heart sounds.  No murmur heard. No BLE edema. Pulmonary/Chest: Effort normal and breath sounds diminished. No respiratory distress. She has no wheezes. Skin: large hematoma over L upper eyelid/brow region - No erythema. + petechia and ecchymoses   Neuro: AAOx4, CN2-12, poor balance but speech fluent without dysarthria, follows commands and MAE - walks with cane and dtr assisting balance Psychiatric: She has a minimally anxious mood and affect. Her behavior is normal. Judgment and thought content normal.   Lab Results  Component Value Date   WBC 8.0 06/27/2011   HGB 12.7 06/27/2011   HCT 38.3 06/27/2011   PLT 225.0 06/27/2011   CHOL 202* 01/28/2011   TRIG 92.0 01/28/2011   HDL 121.20 01/28/2011   LDLDIRECT 66.9 01/28/2011   ALT 32 01/28/2011   AST 34 01/28/2011   NA 138 01/28/2011   K 3.8 01/28/2011   CL 99 01/28/2011   CREATININE 0.7 01/28/2011   BUN 6 01/28/2011   CO2 32 01/28/2011   TSH 1.62 01/28/2011   INR 0.94 02/05/2010   HGBA1C 5.4 01/28/2011      Assessment & Plan:  Falls, orthostatic hypotension (symptomatic) - Scalp hematoma -   Check head ct Check labs Start daily florinef if labs/head ct normal  See problem list. Medications and labs reviewed today.

## 2011-06-29 NOTE — Patient Instructions (Signed)
It was good to see you today. we'll make referral to head ct . Our office will contact you regarding appointment(s) once made. Test(s) ordered today. Your results will be called to you after review (48-72hours after test completion). If any changes need to be made, you will be notified at that time. Start florinef daily if labs normal -  Please schedule followup in 4 months, call sooner if problems.

## 2011-06-30 ENCOUNTER — Ambulatory Visit (INDEPENDENT_AMBULATORY_CARE_PROVIDER_SITE_OTHER)
Admission: RE | Admit: 2011-06-30 | Discharge: 2011-06-30 | Disposition: A | Discharge status: Home / Self Care | Payer: Medicare Other | Source: Ambulatory Visit | Attending: Cardiology | Admitting: Cardiology

## 2011-06-30 DIAGNOSIS — W19XXXA Unspecified fall, initial encounter: Secondary | ICD-10-CM

## 2011-06-30 DIAGNOSIS — S1093XA Contusion of unspecified part of neck, initial encounter: Secondary | ICD-10-CM

## 2011-06-30 DIAGNOSIS — S0003XA Contusion of scalp, initial encounter: Secondary | ICD-10-CM

## 2011-06-30 DIAGNOSIS — I951 Orthostatic hypotension: Secondary | ICD-10-CM

## 2011-06-30 NOTE — Assessment & Plan Note (Signed)
Labs and NCS results reviewed 01/2011: indicates no periph neuropathy but L>R S1 radiculopathy Consider additional imaging if increasing or recurrent leg symptoms but little concern to pt at this time Given recurrent fall events, may consider neuro eval if labs/head ct ok and no improvement on florinef for orthostatic component

## 2011-06-30 NOTE — Assessment & Plan Note (Signed)
Lab Results  Component Value Date   TSH 0.76 06/29/2011   The current medical regimen is effective;  continue present plan and medications.  

## 2011-07-01 ENCOUNTER — Telehealth: Payer: Self-pay | Admitting: Internal Medicine

## 2011-07-01 ENCOUNTER — Other Ambulatory Visit: Payer: Self-pay | Admitting: Internal Medicine

## 2011-07-01 MED ORDER — FLUDROCORTISONE ACETATE 0.1 MG PO TABS: 0.1000 mg | ORAL_TABLET | Freq: Every day | ORAL | Status: DC

## 2011-07-01 NOTE — Telephone Encounter (Signed)
I spoke with [facility] and was advised they do have pt's rx. I advised pt of this and she voiced her understanding and had no questions

## 2011-07-01 NOTE — Telephone Encounter (Signed)
Pt called back re: xopenex. She says she just spoke to someone at [facility] and was told that this rx was denied. Pt wants to know "why dr young doesn't want her to take this" or if a new rx for xopenex will be called in. Pt needs this delivered to her today if that's the case so she needs a call back asap. Hazel Sams

## 2011-07-02 NOTE — Assessment & Plan Note (Addendum)
Ongoing asthmatic bronchitis following a series of viral respiratory infections. Very slight hemoptysis needs to be watched but with her hard coughing, it is probably benign hemorrhagic bronchitis. Plan-Depo-Medrol, refill prescription doxycycline.

## 2011-07-05 ENCOUNTER — Other Ambulatory Visit: Payer: Self-pay | Admitting: Internal Medicine

## 2011-07-05 ENCOUNTER — Telehealth: Payer: Self-pay | Admitting: Internal Medicine

## 2011-07-05 NOTE — Telephone Encounter (Signed)
Spoke with pt and she states that she still has not received xopenex nebs. I called the pharmacy again and was advised that she needs PA done. Will await fax.

## 2011-07-07 NOTE — Telephone Encounter (Signed)
Looked through the [employer] and still have not received fax from [employer].  Called [facility] and waited on hold for over 5 mins for the pharmacist to get them to refax the PA #.  Will call back.

## 2011-07-08 MED ORDER — LEVALBUTEROL HCL 1.25 MG/3ML IN NEBU: INHALATION_SOLUTION | RESPIRATORY_TRACT | Status: DC

## 2011-07-08 NOTE — Telephone Encounter (Signed)
I called and spoke with the pharmacist, April. She states that the Xopenex needs to be filed under pt's MCR Part B and they do not do that. The patient will need to either get this medication at [facility] or through her DME company.  Patient notified that she will need to get the Xopenex Nebs at [facility] or DME and she wants this sent to [facility] on [address]. RX printed and given to Florentina Addison so CDY can sign and we will fax this to [facility].

## 2011-07-11 ENCOUNTER — Telehealth: Payer: Self-pay | Admitting: Internal Medicine

## 2011-07-11 NOTE — Telephone Encounter (Signed)
The pt called and stated she still has a knot on her head and is hoping to get advice on what to do. She states she fell two weeks ago and was evaluated, but is concerned the knot has not gone down.    Thanks!

## 2011-07-12 ENCOUNTER — Telehealth: Payer: Self-pay | Admitting: Internal Medicine

## 2011-07-12 NOTE — Telephone Encounter (Signed)
Pt stated that when her husband tried to pick up Rx, they were going to charge her more than $300.00 for it.  Would like to know if we called in the generic or the name brand.  Requests we get this fixed because she can't afford the $300.00.  Pt wants this fixed today & to be called by Florentina Addison.  Antionette Fairy

## 2011-07-12 NOTE — Telephone Encounter (Signed)
Spoke with pt. She states that when she picked up her rx for xopenex 07/08/11, they only gave her 1 box and stated that they would have the rest in stock for her today. Her spouse went to pick up the remainder of the rx and was advised that med will cost 300$ and "needs an okay from Dr. Maple Hudson".  I have called and spoke with pharmacist and was advised that xopenex needs a PA Will await fax and call to initiate.  Pt aware of the above

## 2011-07-13 NOTE — Telephone Encounter (Signed)
Hematoma may take up to 6 weeks to resolve - small knot may remain even after that time - if problems such as increasing size or pain please come for ROV - thanks

## 2011-07-13 NOTE — Telephone Encounter (Signed)
I spoke with [facility] and is going to re fax the PA form to the triage fax #. Will await fax

## 2011-07-14 ENCOUNTER — Other Ambulatory Visit: Payer: Self-pay

## 2011-07-14 MED ORDER — LEVOTHYROXINE SODIUM 100 MCG PO TABS: 100.0000 ug | ORAL_TABLET | Freq: Every day | ORAL | Status: DC

## 2011-07-14 NOTE — Telephone Encounter (Signed)
I spoke with [facility] pharmacy-pt has Part B medicare coverage and should be ran under that first; if denied then we can try for PA but need that documented first. Pt will go to pharmacy now; understands to have pharmacy call me if denied so I can continue with PA process.

## 2011-07-14 NOTE — Telephone Encounter (Signed)
Spoke with patient-aware that CY will need to fill out and then we will fax back to insurance company. Aware that [employer] has 72 hours to decide.

## 2011-07-14 NOTE — Telephone Encounter (Signed)
Pt wants a status update.  [name]

## 2011-07-14 NOTE — Telephone Encounter (Signed)
Form received and placed on CY look-at to sign. Carron Curie, CMA

## 2011-07-15 NOTE — Telephone Encounter (Signed)
I spoke with pt and she states and she states pt states she states her husband took her card down to the [facility] but is not sure what happened. She states she has to speak with her husband regarding this. She states her husband did not show her medicare part b card and will do that this weekend

## 2011-07-17 IMAGING — CR DG CHEST 2V
2 series · 2 of 2 positions shown · non-contrast
Comparison: 10/01/2009

CLINICAL DATA: Lung cancer

CHEST - 2 VIEW

[w chest pa]
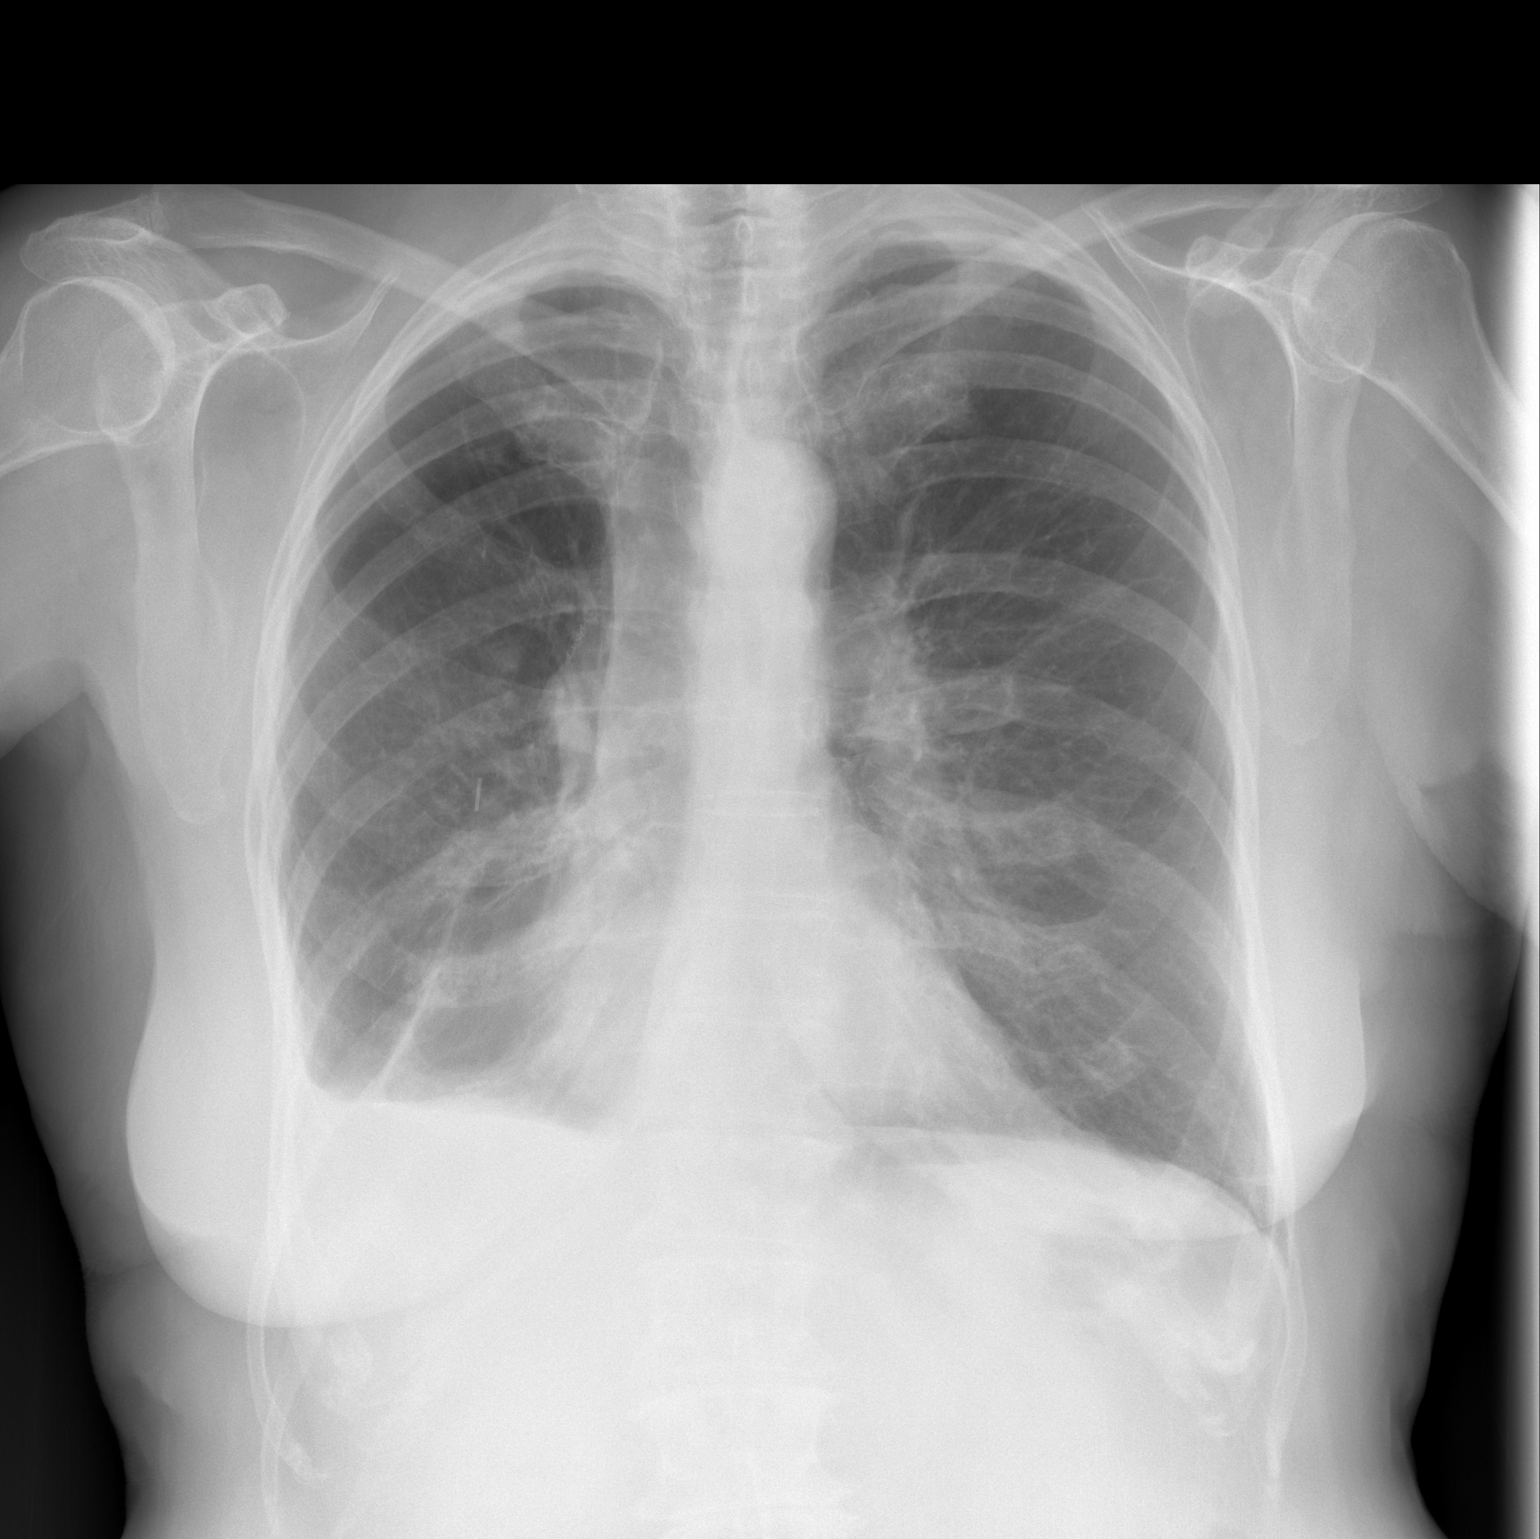

[w chest lat]
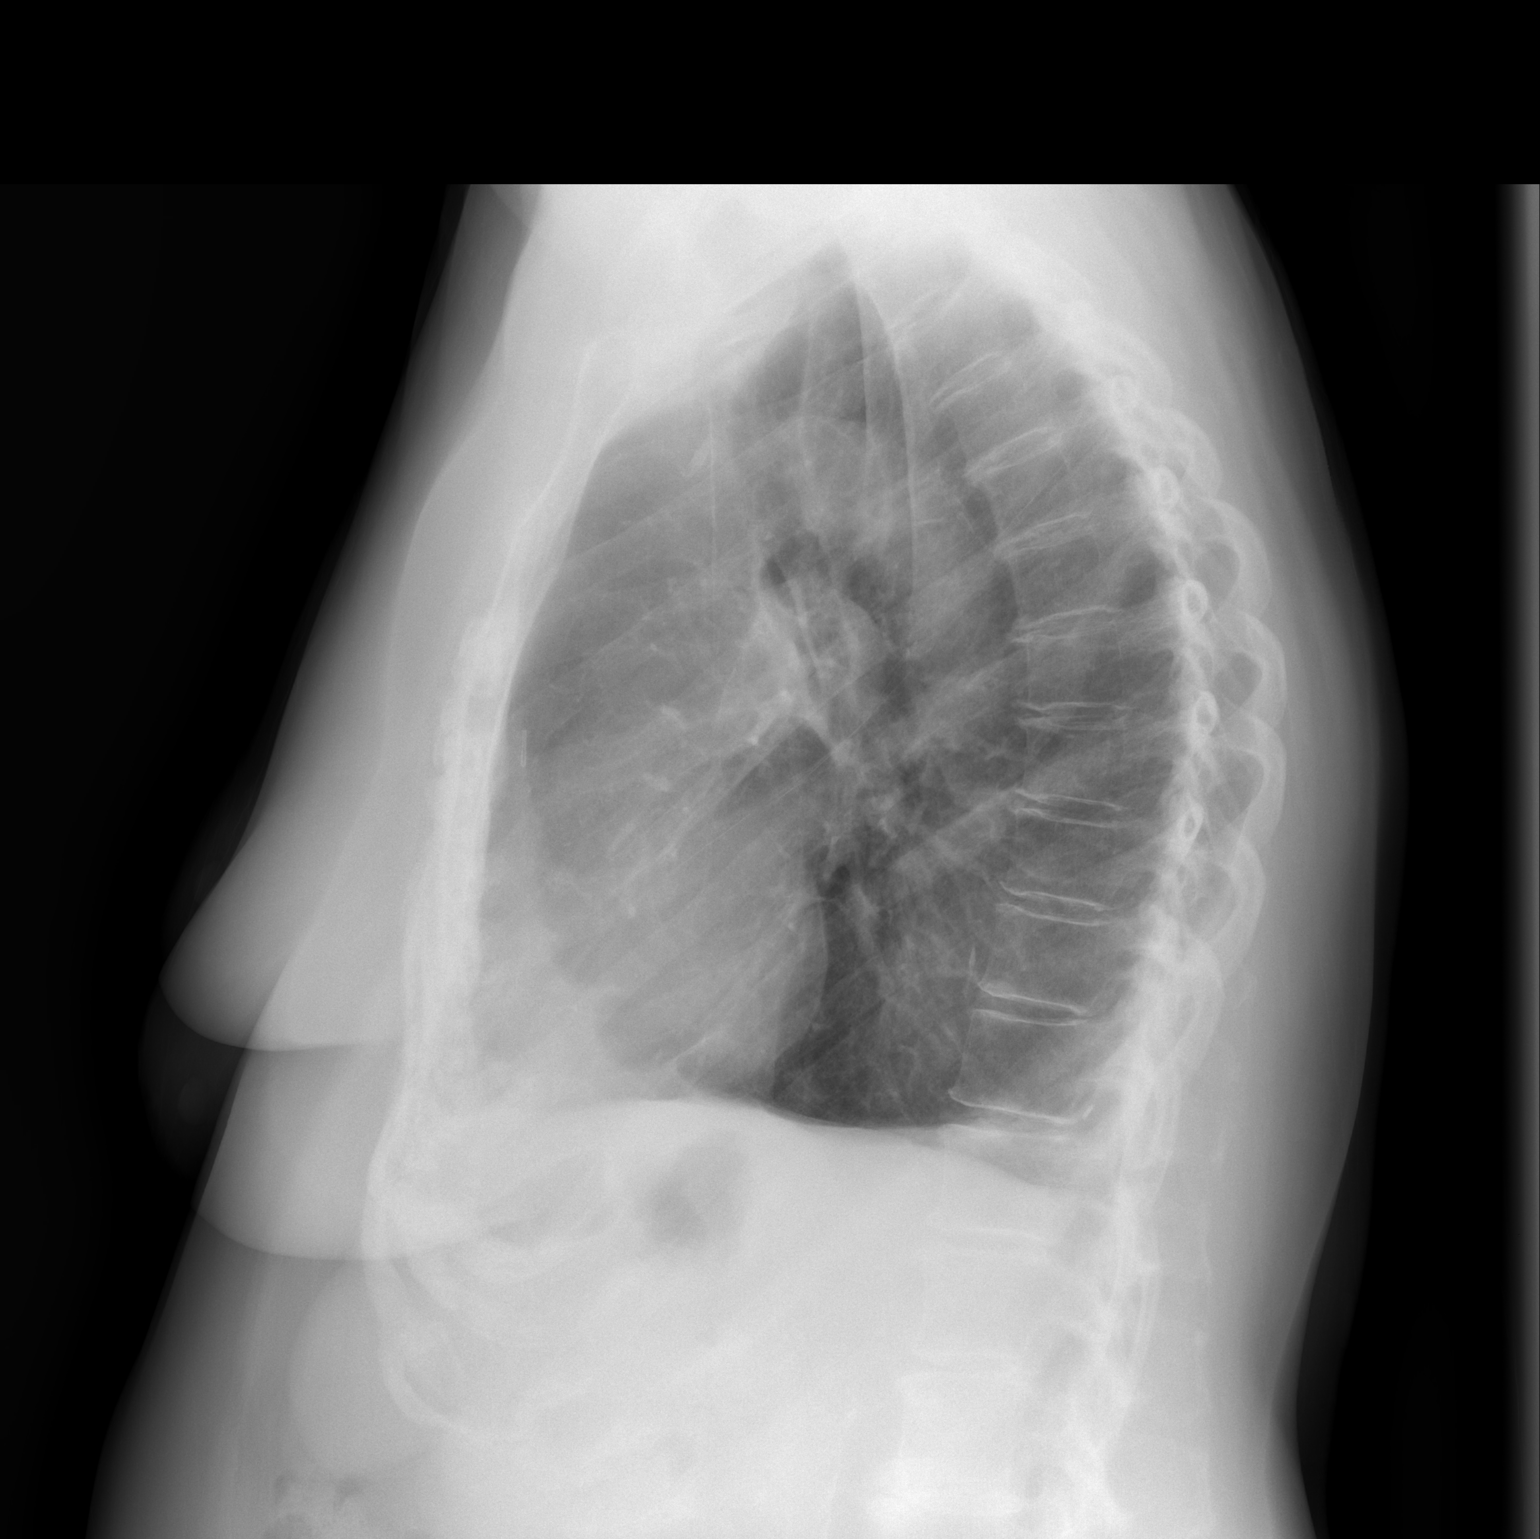

[2 of 2 positions shown; findings below may reference images not displayed]

FINDINGS: Heart size is normal.

Stable pleural and parenchymal scarring in the right lung base.

The left lung is clear.

No airspace consolidation identified.

There is no edema.
IMPRESSION: 1.  Stable pleural and parenchymal scarring in the right lung base.

## 2011-07-18 NOTE — Telephone Encounter (Signed)
I called to check with patient-she has not been to her pharmacy yet to show the Medicare Part B card due to weather. She will do this asap and call me back with approval or denial.

## 2011-07-19 NOTE — Telephone Encounter (Signed)
Patient calling Katie back.  She states pharmacy faxed form to CY to fill out for approval.  Requesting to speak to [facility] about this.  [phone]

## 2011-07-22 NOTE — Telephone Encounter (Signed)
Spoke with pharmacy-I gave information to [facility] pharmacy about patients Medicare Part B coverage-this went through and patient has copay of $19.26. Pt is aware and states her husband is on his way to pick up medication.

## 2011-07-22 NOTE — Telephone Encounter (Signed)
Per Florentina Addison she is handling this.

## 2011-07-28 ENCOUNTER — Telehealth: Payer: Self-pay

## 2011-07-28 DIAGNOSIS — R300 Dysuria: Secondary | ICD-10-CM

## 2011-07-28 NOTE — Telephone Encounter (Signed)
Pt called stating Florinef is causing severe itching all over her body.  Pt is unsure of the reason that she was prescribed this medication but is requesting to discontinue medication, at least on a trial basis to determine if it is the cause of itching. Pt is also requesting an order for a UA for dysuria and itching.   FYI - pt says she is still experiencing dizziness but has not had another fainting spell.

## 2011-07-29 NOTE — Telephone Encounter (Signed)
Ok to try off florinef -  it was prescribed to help treat dizzy spells/passing out events by treating her low BP Ok for UA - will call with results and possible tx after review - thanks

## 2011-07-29 NOTE — Telephone Encounter (Signed)
Pt advised to HOLD medication x 1 week and report back. UA ordered, pt informed

## 2011-08-02 ENCOUNTER — Telehealth: Payer: Self-pay | Admitting: *Deleted

## 2011-08-02 DIAGNOSIS — E785 Hyperlipidemia, unspecified: Secondary | ICD-10-CM

## 2011-08-02 MED ORDER — ROSUVASTATIN CALCIUM 20 MG PO TABS: 10.0000 mg | ORAL_TABLET | Freq: Every day | ORAL | Status: DC

## 2011-08-02 NOTE — Telephone Encounter (Signed)
Notified pt with md response & recommendations. Inform pt sent crestor to her pharmacy... 08/02/11@2 :37/LMB

## 2011-08-02 NOTE — Telephone Encounter (Signed)
Left msg on vm stating coming to have urine check. Want to have cholesterol check also, not sure when was the last time she had check. Pt states she is out of her meds... 08/02/11@11 :52am/LMB

## 2011-08-02 NOTE — Telephone Encounter (Signed)
Ok to check UA, will call after review lipids checked in 01/2011 - will plan to check lipids at next OV  ok to refill meds

## 2011-08-03 ENCOUNTER — Ambulatory Visit (INDEPENDENT_AMBULATORY_CARE_PROVIDER_SITE_OTHER): Payer: Medicare Other | Admitting: Internal Medicine

## 2011-08-03 ENCOUNTER — Other Ambulatory Visit (INDEPENDENT_AMBULATORY_CARE_PROVIDER_SITE_OTHER): Payer: Medicare Other

## 2011-08-03 ENCOUNTER — Encounter: Payer: Self-pay | Admitting: Internal Medicine

## 2011-08-03 VITALS — BP 128/62 | HR 80 | Temp 96.4°F | Wt 169.1 lb

## 2011-08-03 DIAGNOSIS — R32 Unspecified urinary incontinence: Secondary | ICD-10-CM

## 2011-08-03 DIAGNOSIS — E785 Hyperlipidemia, unspecified: Secondary | ICD-10-CM

## 2011-08-03 DIAGNOSIS — R300 Dysuria: Secondary | ICD-10-CM

## 2011-08-03 DIAGNOSIS — Z124 Encounter for screening for malignant neoplasm of cervix: Secondary | ICD-10-CM

## 2011-08-03 LAB — URINALYSIS, ROUTINE W REFLEX MICROSCOPIC
Ketones, ur: NEGATIVE
Specific Gravity, Urine: <=1.005 (ref 1.000–1.030)
Total Protein, Urine: NEGATIVE
Urine Glucose: NEGATIVE
pH: 7 (ref 5.0–8.0)

## 2011-08-03 MED ORDER — ATORVASTATIN CALCIUM 40 MG PO TABS: 40.0000 mg | ORAL_TABLET | Freq: Every day | ORAL | Status: DC

## 2011-08-03 NOTE — Patient Instructions (Signed)
It was good to see you today. We have reviewed your prior records including labs and tests today Stop crestor and resume atorvastatin for cholesterol - Your prescription(s) have been submitted to your pharmacy. Please take as directed and contact our office if you believe you are having problem(s) with the medication(s). we'll make referral to gynecologist as discussed . Our office will contact you regarding appointment(s) once made.  Give time for the buise and "knot" to fade - ice or tylenol as needed

## 2011-08-03 NOTE — Assessment & Plan Note (Signed)
Prev on Lipitor - stopped due to myalgia - increase lipids prompted resume of med tx - On crestor - inc muscle fatigue so decrease dose (protected by high HDL) will resume Lipitor now due to $$ issues 01/2011 labs reviewed with pt today

## 2011-08-03 NOTE — Progress Notes (Signed)
  Subjective:    Patient ID: [name], female    DOB: 11-May-1933, 78 y.o.   MRN: [id number]  HPI  Here for follow up  accidental fall x 2 06/28/11 and 06/10/11 - no falls in interim  Mild hematoma persisting over L eye, tender to touch Frequent orthostatic symptoms with position changes> but intolerant of midodrine trial No confusion, vision changes but increasing "unsteady feeling and falling events" No incontinence or seizure - no loss of consciousness before or during fall  Also reviewed chronic medical issues: COPD - follows with pulm for same - wearing O2 qhs - the patient reports compliance with medication(s) as prescribed. Denies adverse side effects.  Dyslipidemia - on statin - the patient reports compliance with medication(s) as prescribed. Denies adverse side effects. Request change to generic  Hypothyroid - the patient reports compliance with medication(s) as prescribed. Denies adverse side effects.   Past Medical History  Diagnosis Date  . Allergic rhinitis   . COPD (chronic obstructive pulmonary disease)   . BC (bronchogenic carcinoma) 2005    Resected RUL  . Pelvic cyst   . Bladder atony   . Unspecified hypothyroidism   . Hyperlipidemia     Review of Systems  Constitutional: Negative for fever and fatigue.  Respiratory: Negative for cough and choking.   Cardiovascular: Negative for chest pain and leg swelling.  Musculoskeletal: Negative for joint swelling and gait problem.       Objective:   Physical Exam  BP 128/62  Pulse 80  Temp(Src) 96.4 F (35.8 C) (Oral)  Wt 169 lb 1.9 oz (76.712 kg)  SpO2 98% Wt Readings from Last 3 Encounters:  08/03/11 169 lb 1.9 oz (76.712 kg)  06/29/11 169 lb 1.9 oz (76.712 kg)  06/28/11 171 lb (77.565 kg)   Constitutional: She appears well-developed and well-nourished. No distress. Spouse at side Neck: Normal range of motion. Neck supple. No JVD present. No thyromegaly present.  Cardiovascular: Normal rate,  regular rhythm and normal heart sounds.  No murmur heard. No BLE edema. Pulmonary/Chest: Effort normal and breath sounds diminished. No respiratory distress. She has no wheezes. Skin: resolving hematoma over L upper eyelid/brow region - No erythema, petechia and ecchymoses   Neuro: AAOx4, CN2-12,  Normal balance, speech fluent without dysarthria, follows commands and MAE - walks independent Psychiatric: She has a minimally anxious mood and affect. Her behavior is normal. Judgment and thought content normal.   Lab Results  Component Value Date   WBC 8.0 06/27/2011   HGB 12.7 06/27/2011   HCT 38.3 06/27/2011   PLT 225.0 06/27/2011   CHOL 202* 01/28/2011   TRIG 92.0 01/28/2011   HDL 121.20 01/28/2011   LDLDIRECT 66.9 01/28/2011   ALT 32 01/28/2011   AST 34 01/28/2011   NA 140 06/29/2011   K 4.2 06/29/2011   CL 99 06/29/2011   CREATININE 0.8 06/29/2011   BUN 9 06/29/2011   CO2 34* 06/29/2011   TSH 0.76 06/29/2011   INR 0.94 02/05/2010   HGBA1C 5.4 01/28/2011      Assessment & Plan:   See problem list. Medications and labs reviewed today.

## 2011-08-19 ENCOUNTER — Telehealth: Payer: Self-pay

## 2011-08-19 MED ORDER — ALPRAZOLAM 0.5 MG PO TABS: 1.0000 mg | ORAL_TABLET | Freq: Every evening | ORAL | Status: DC | PRN

## 2011-08-19 NOTE — Telephone Encounter (Signed)
[facility] to [facility], pt informed.

## 2011-08-19 NOTE — Telephone Encounter (Signed)
Pt has refills for [facility]am [facility] [address] but she is no longer using this pharmacy. Pt is requesting remaining refills (5) to be sent to [facility]. Molli Knock to change?

## 2011-08-19 NOTE — Telephone Encounter (Signed)
Yes - [facility] and ask to [facility] remaining [facility]ills, then call in remaining refills to [facility] as requested  Also please change her preferred pharmacy in our EMR

## 2011-08-31 IMAGING — CT CT PELVIS W/O CM
1 of 2 series · 14 of 32 positions shown, 19 images · non-contrast
Comparison: 07/23/2009

CLINICAL DATA: History of prior aspiration and drainage of
posterior left pelvic cyst located to the left of the rectum on
07/23/2009.  Aspiration at that time yielded 200 ml of fluid.  The
patient has recurrent pelvic discomfort with possible recurrence of
the cyst.  She presents for evaluation prior to potential drainage
and sclerosis of the residual cyst cavity.

CT PELVIS WITHOUT CONTRAST
TECHNIQUE: Multidetector CT imaging of the pelvis was performed
following the standard protocol without intravenous contrast.

[Series 2: rtn pelvis st · axial · 0.60mm/px · z∈[+984,+1139]mm · 14 of 37 slices shown, 19 images]
[im 3/37  soft-tissue]
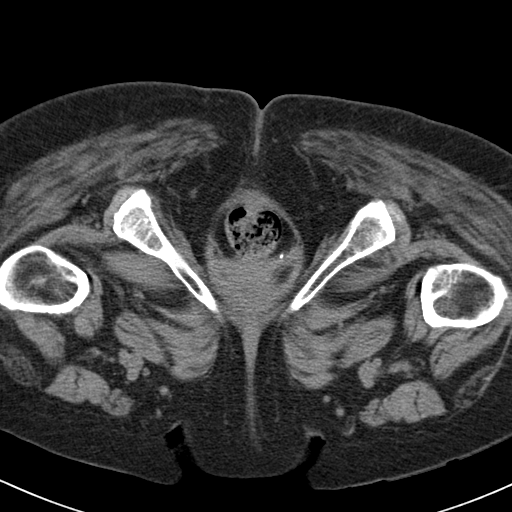
[im 3/37  bone]
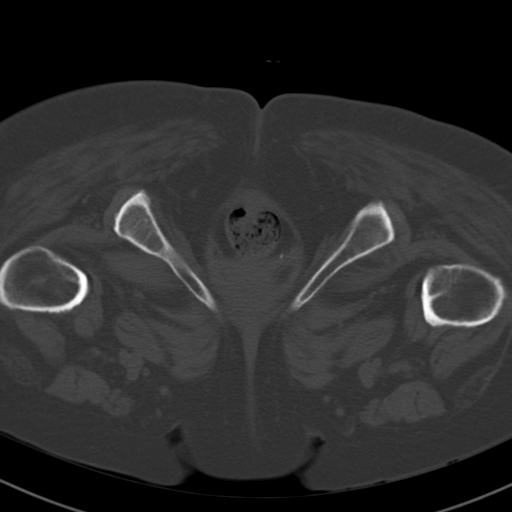
[im 5/37  soft-tissue]
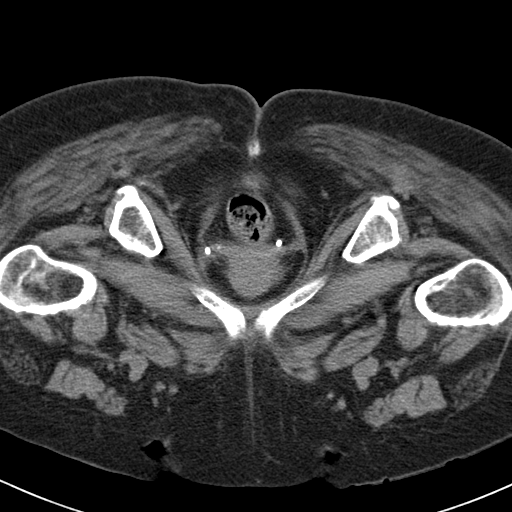
[im 7/37  soft-tissue]
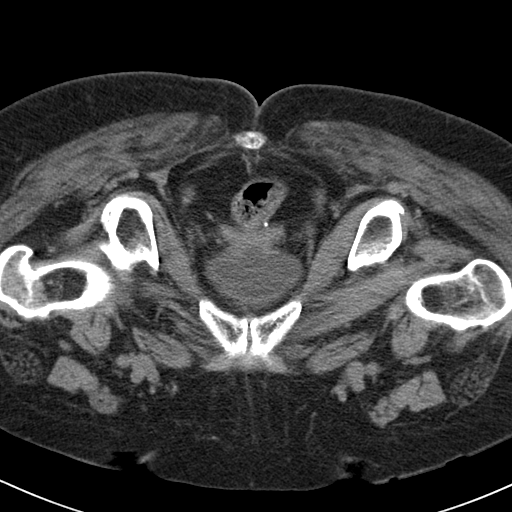
[im 12/37  soft-tissue]
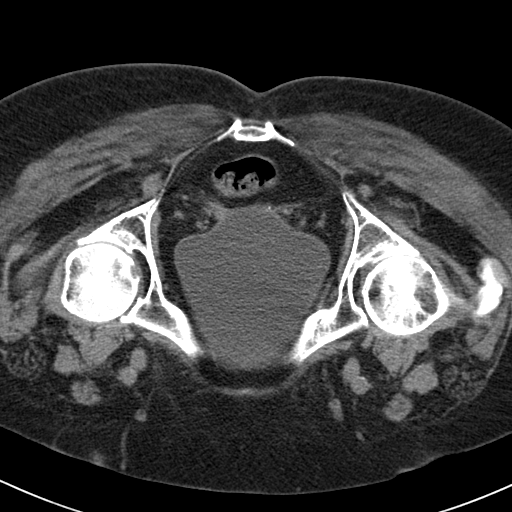
[im 14/37  soft-tissue]
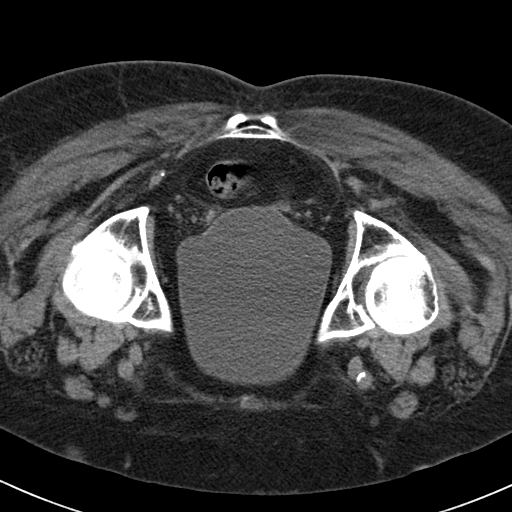
[im 16/37  soft-tissue]
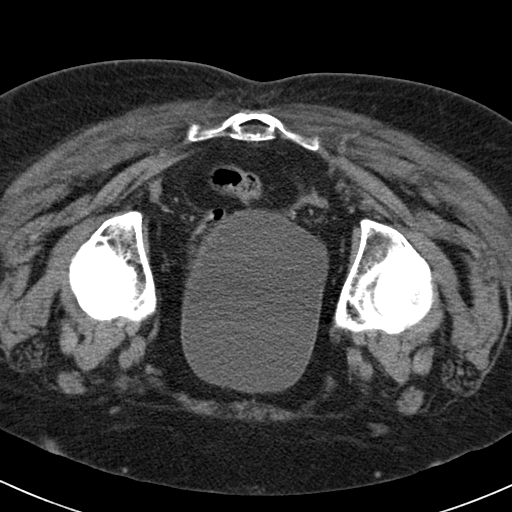
[im 19/37  soft-tissue]
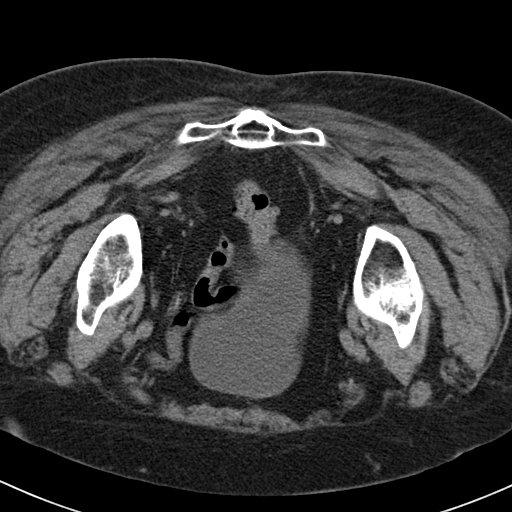
[im 21/37  soft-tissue]
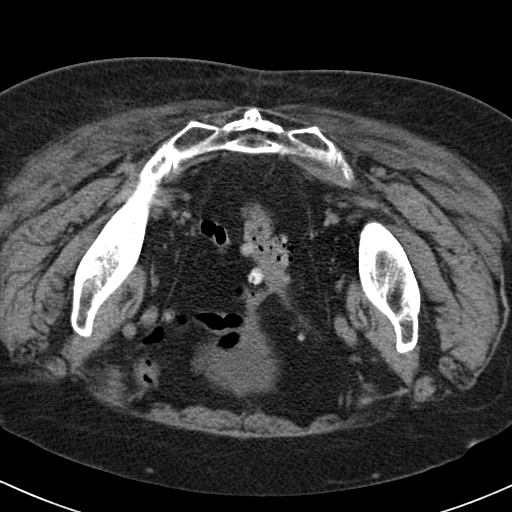
[im 23/37  soft-tissue]
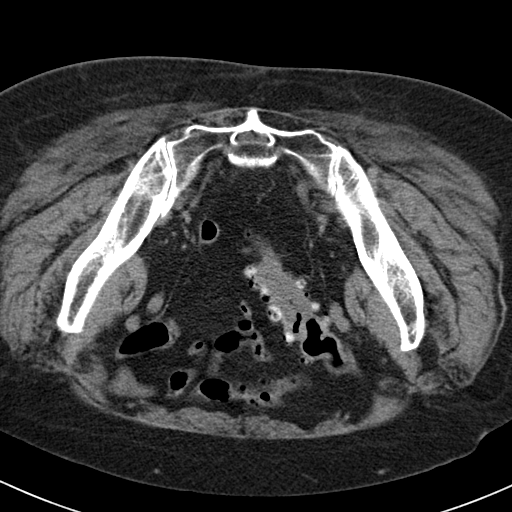
[im 23/37  bone]
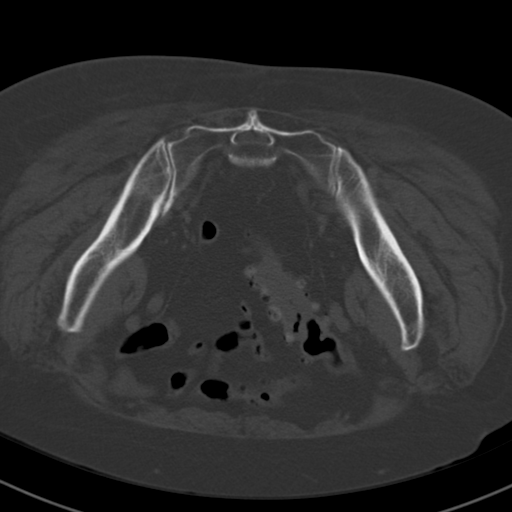
[im 25/37  soft-tissue]
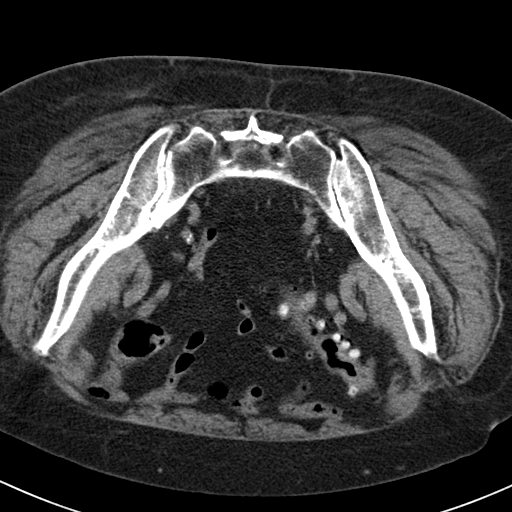
[im 28/37  lung]
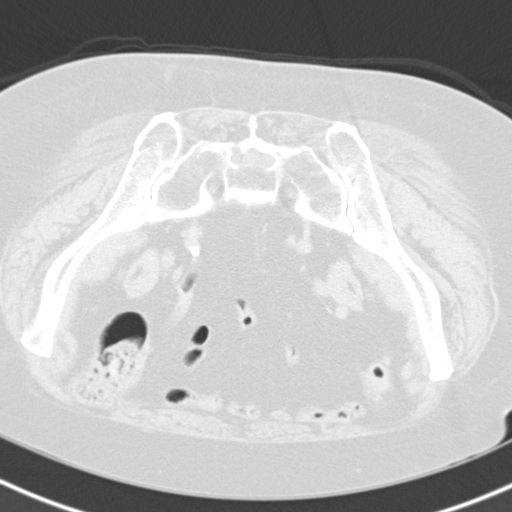
[im 30/37  soft-tissue]
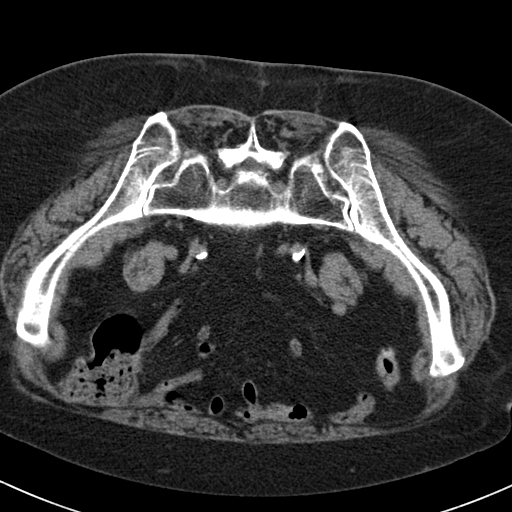
[im 30/37  lung]
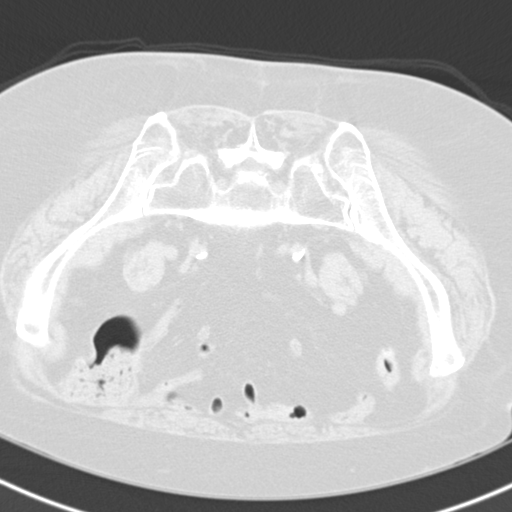
[im 32/37  soft-tissue]
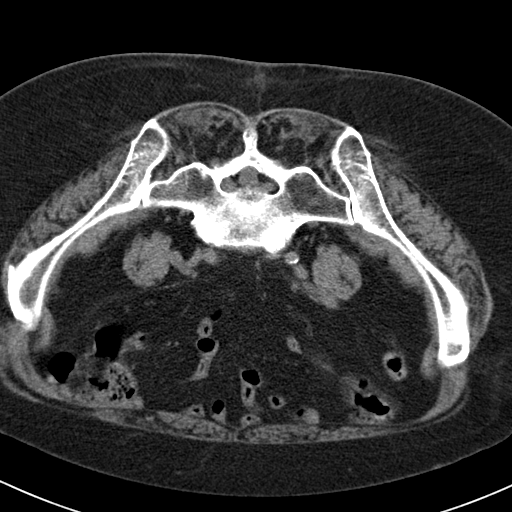
[im 32/37  lung]
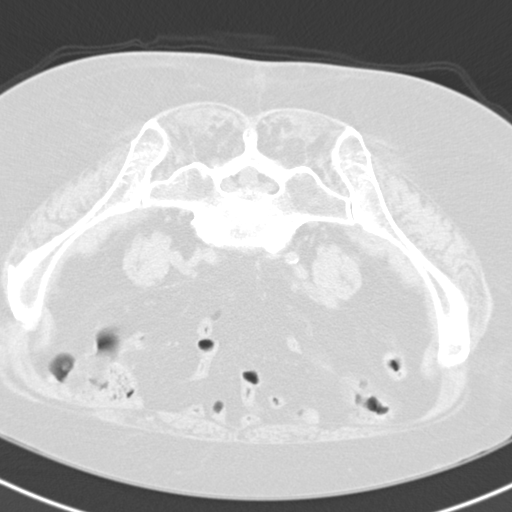
[im 34/37  soft-tissue]
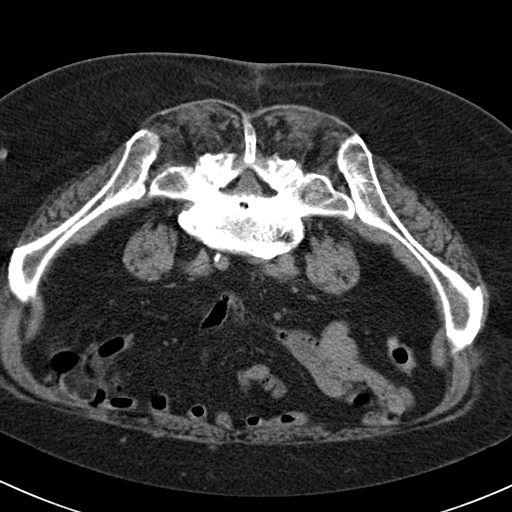
[im 34/37  lung]
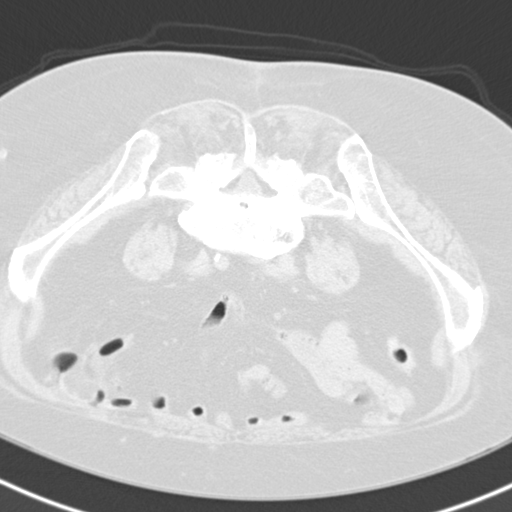

[14 of 32 positions shown; findings below may reference images not displayed]

FINDINGS: The patient was initially scanned in a prone position
after being asked to void.  She was then additionally scanned in a
supine position after attempting to void.

There is no evidence of recurrent cystic fluid collection in the
posterior pelvis at the site of previously drained collection.
During initial scanning, the bladder was noted to be moderately
distended.  The patient reattempted to void.  However, she was
unable to urinate at all.  Based on estimated dimensions of the
bladder of 9.1 x 11.2 x 8.2 cm, estimated volume of urine in the
bladder is 435 ml.

The patient states that she has had previous urinary retention and
urinary tract infection which is follow by Dr. Ver.
IMPRESSION: 1.  No recurrence of the previously drained posterior pelvic cystic
fluid collection.  Aspiration therefore did not have to be
performed.
2.  Evidence of urinary retention with moderately distended bladder
present during scanning.  The patient could not void additionally
and estimated volume of retained urine at the time of the scan in
the bladder is 435 ml.

## 2011-09-14 ENCOUNTER — Telehealth: Payer: Self-pay | Admitting: Internal Medicine

## 2011-09-14 NOTE — Telephone Encounter (Signed)
Pt aware that it is okay to take loratadine with her other medications.

## 2011-09-23 ENCOUNTER — Ambulatory Visit: Payer: Medicare Other | Admitting: Internal Medicine

## 2011-10-09 IMAGING — CR DG CHEST 2V
2 series · 2 of 2 positions shown · non-contrast
Comparison: 12/22/2009 study.

CLINICAL DATA: History of COPD, emphysema, and lung nodule.  Follow-
up.

CHEST - 2 VIEW

[view not recorded (1 of 2)]
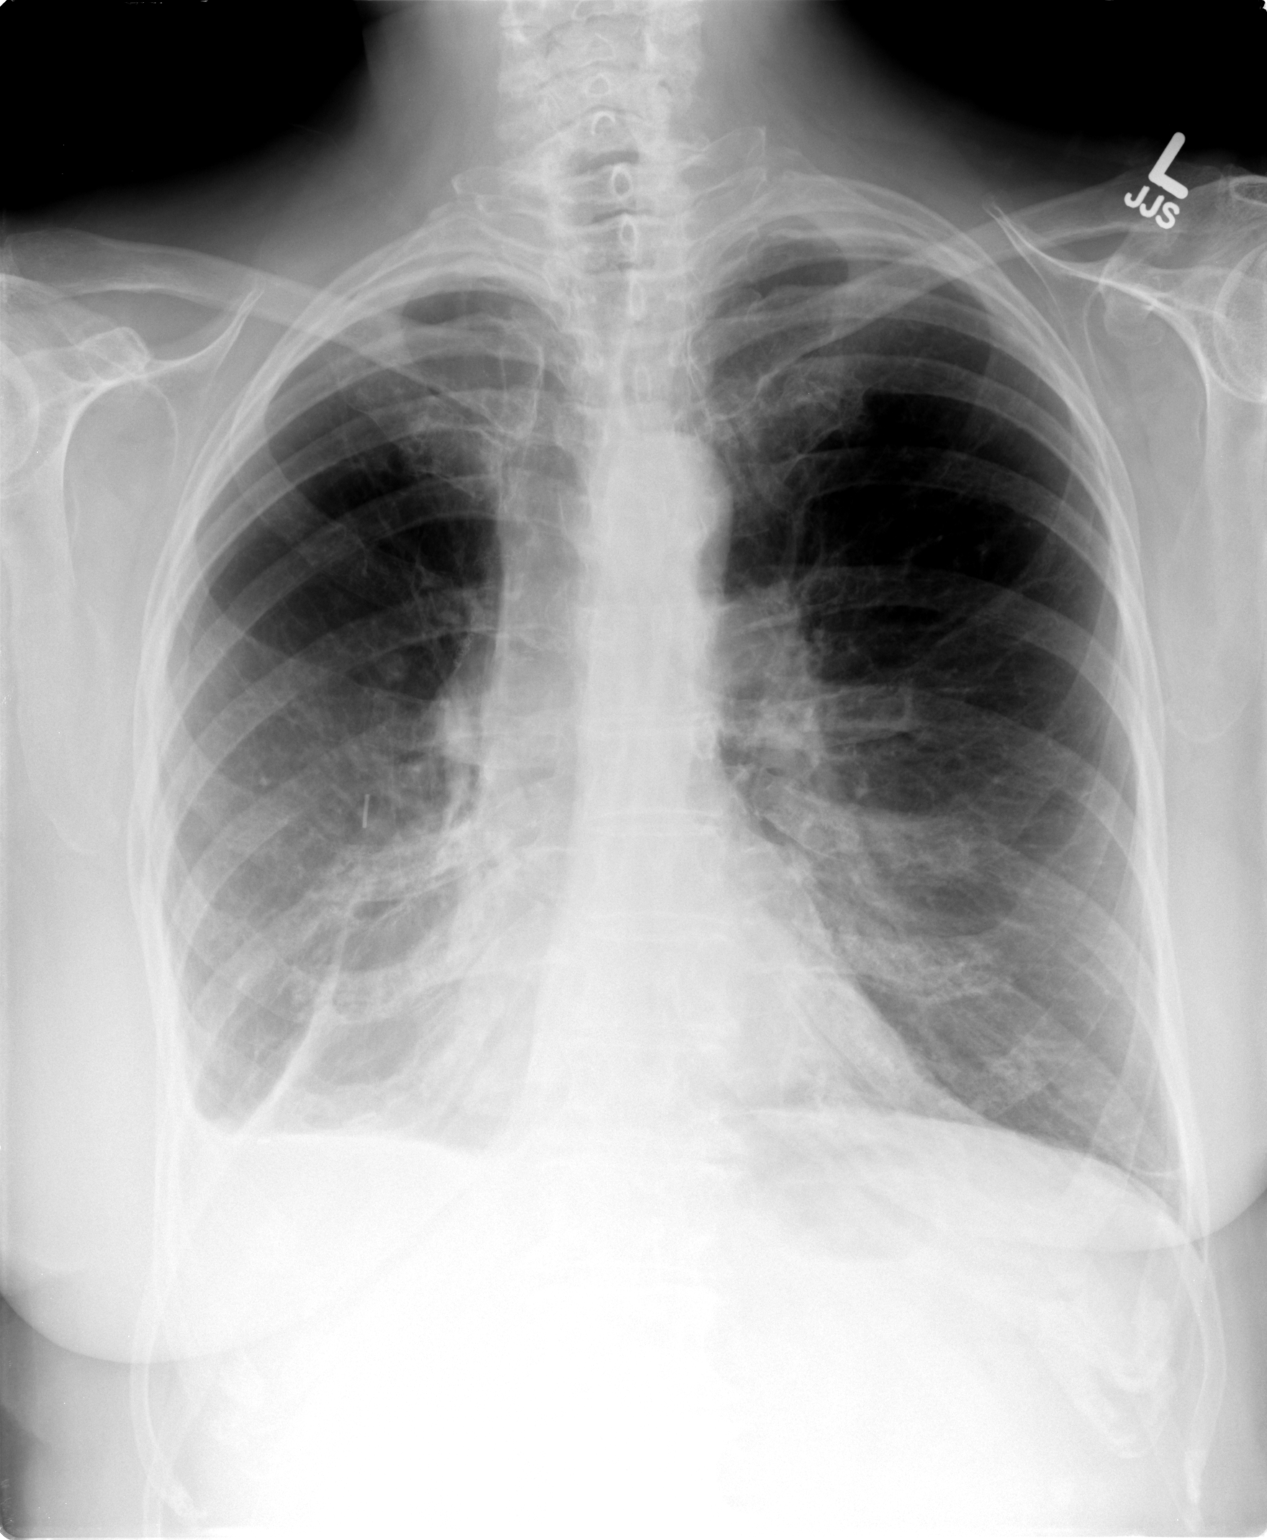

[view not recorded (2 of 2)]
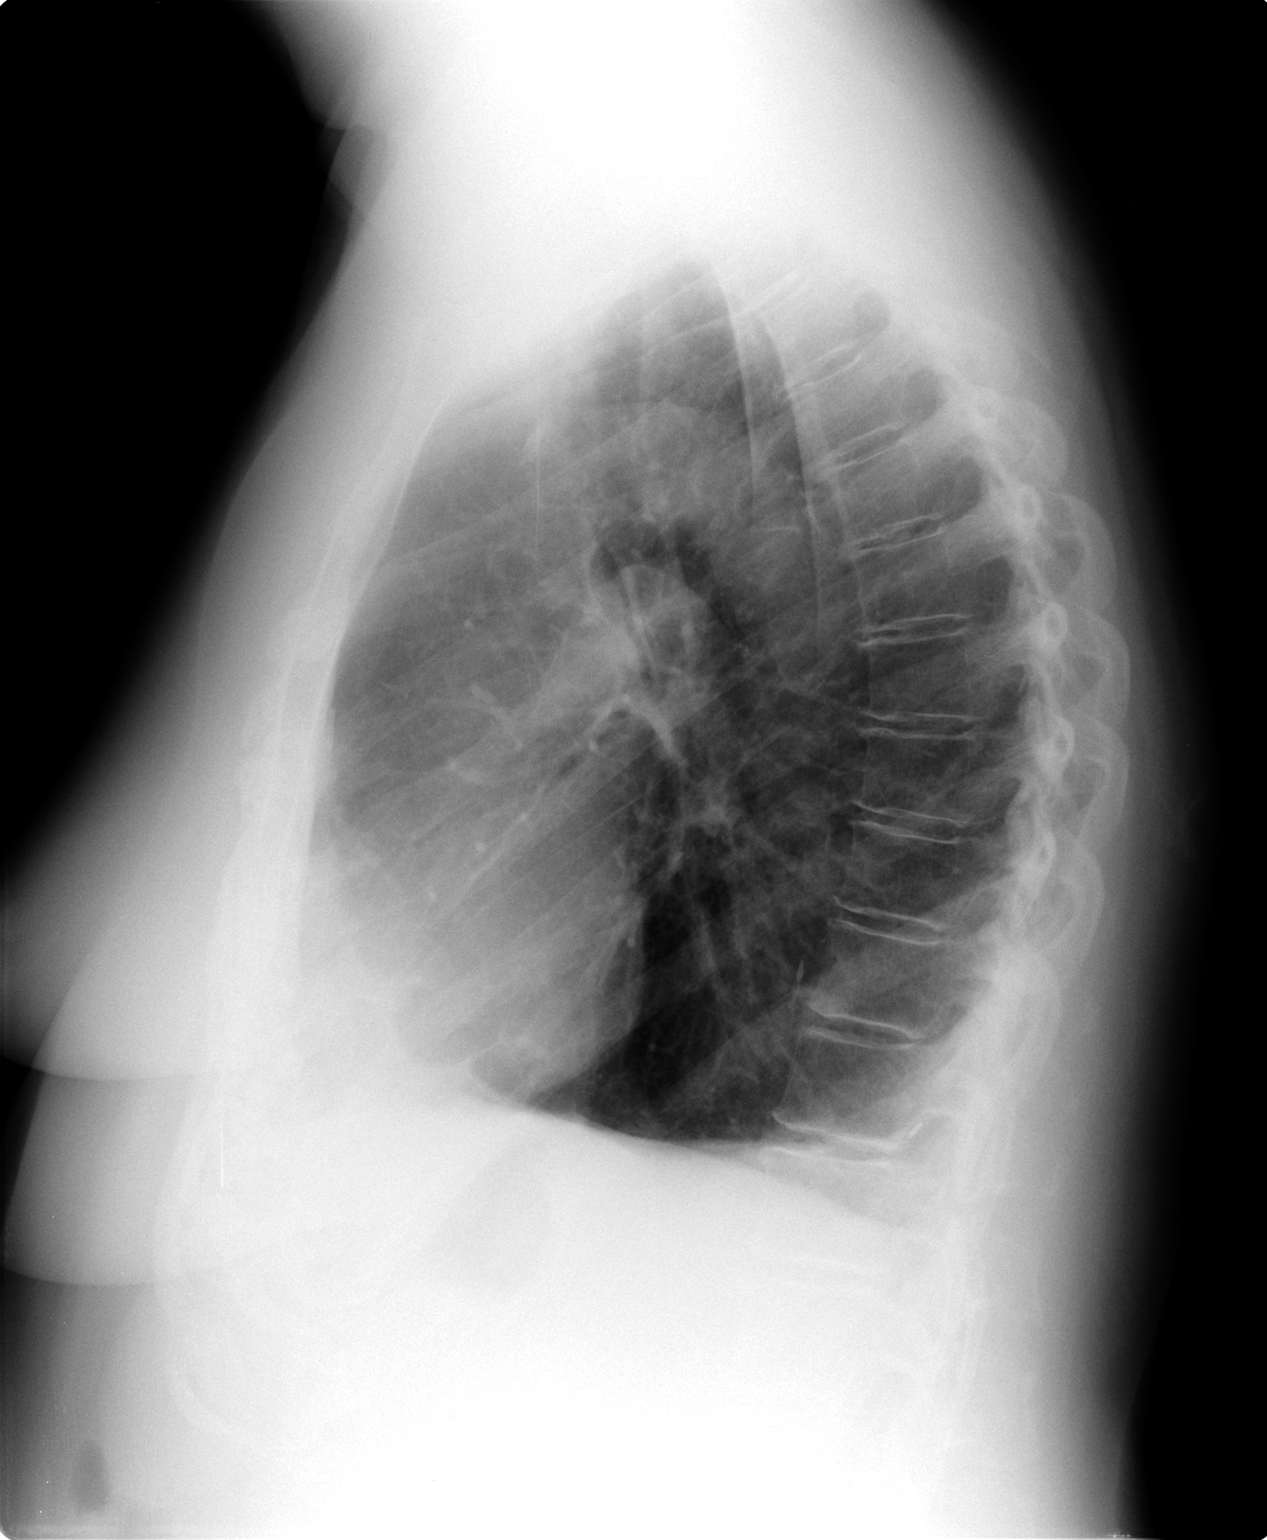

[2 of 2 positions shown; findings below may reference images not displayed]

FINDINGS: The cardiac silhouette is normal size and shape. There is
a surgical clip in the right hilar region.  Chronic volume loss,
fibrosis, and pleural thickening are present in the right inferior
hemithorax.  Left lung is free of infiltrates.  No pneumothorax is
seen.  No left pleural disease is evident.  There is generalized
hyperinflation configuration with flattening of the diaphragm on
lateral image.  Nonaneurysmal aortic calcification is present.
There is minimal degenerative spondylosis.  There is osteopenic
appearance of the bones.  No adenopathy is seen.
IMPRESSION: Hyperinflation consistent with obstructive pulmonary disease.
Chronic volume loss, pulmonary fibrosis, and pleural thickening in
the inferior right hemithorax are stable.  No new lesion is
evident.

## 2011-10-13 ENCOUNTER — Telehealth: Payer: Self-pay | Admitting: Oncology

## 2011-10-13 ENCOUNTER — Ambulatory Visit (INDEPENDENT_AMBULATORY_CARE_PROVIDER_SITE_OTHER): Payer: Medicare Other | Admitting: Internal Medicine

## 2011-10-13 ENCOUNTER — Other Ambulatory Visit: Payer: Self-pay | Admitting: Oncology

## 2011-10-13 ENCOUNTER — Encounter: Payer: Self-pay | Admitting: Internal Medicine

## 2011-10-13 VITALS — BP 136/74 | HR 91 | Ht 65.0 in | Wt 170.6 lb

## 2011-10-13 DIAGNOSIS — C3410 Malignant neoplasm of upper lobe, unspecified bronchus or lung: Secondary | ICD-10-CM

## 2011-10-13 DIAGNOSIS — J4489 Other specified chronic obstructive pulmonary disease: Secondary | ICD-10-CM

## 2011-10-13 DIAGNOSIS — J309 Allergic rhinitis, unspecified: Secondary | ICD-10-CM

## 2011-10-13 DIAGNOSIS — J449 Chronic obstructive pulmonary disease, unspecified: Secondary | ICD-10-CM

## 2011-10-13 MED ORDER — METHYLPREDNISOLONE ACETATE 80 MG/ML IJ SUSP
80.0000 mg | Freq: Once | INTRAMUSCULAR | Status: AC
  Administered 2011-10-13: 80 mg via INTRAMUSCULAR

## 2011-10-13 NOTE — Patient Instructions (Signed)
[address]  [address]ing

## 2011-10-13 NOTE — Progress Notes (Signed)
Patient ID: [name], female    DOB: [birth year], 78 y.o.   MRN: [id number]  HPI [birth year]-78 year old female former smoker followed for history of right upper lobe cancer, COPD, allergic rhinitis  Here with husband Last here -August 05, 2010- note reviewed Increased  coughing 2 weeks, but struggling all summer. Better at beach. Here few days ago to see Dr Yetta Barre.   Had significantly productive cough- purulent, sometimes  with black spots. Uses nebulizer 2-3x/day, O2 2l/ [employer]. On Avelox now with Symbicort from Dr Yetta Barre. Mouth is getting sore. Still actively wheezing and short of breath.  CXR December 07, 2010- clear except old surgical changes.  She is not sure if she began this flare with cold or allergy. Low fever once or twice in past month. CT w/cm- 12/21/10- normal heart size coronary calcification. Prior right upper lobectomy. Stable right millimeter nodule at right minor fissure. No suspicious nodules. Centrilobular emphysema.  05/24/11- 78 year old female former smoker followed for history of right upper lobe cancer, COPD, allergic rhinitis. Here with husband Has had flu shot. She was feeling well until one week ago when she developed sore throat, temperature to 99.7, chills and malaise, cough productive of purulent blood-streaked sputum. Has home nebulizer being used only once daily with Xopenex. She denies swollen glands, fluid retention, GI upset or chest pain.  06/28/11- 78 year old female former smoker followed for history of right upper lobe cancer, COPD, allergic rhinitis. Here with husband Reports- wheezing, chest congestion, runny nose, cough with green/red mucus tinged with blood.  She got well after last visit and been sick again. Did have flu vaccine. Now 3 or 4 days of rhinorrhea, chills, productive cough with discolored sputum. 3 days ago had brief episode of pink in sputum with hard cough and. Cough and sputum production are less today. Occasional tussive soreness  at the xiphoid. Frequent heartburn. Doxycycline and seemed to help but is completed now. Does have home nebulizer.  Had chest x-ray January 7: *RADIOLOGY REPORT* 06/27/11- Clinical Data: 78 year old with hemoptysis.  CHEST - 2 VIEW  Comparison: Chest radiograph 05/24/2011  Findings: Chronic volume loss and postoperative changes in the  right hemithorax. The upper lungs are clear with increased  lucency. Findings suggest emphysematous changes. Stable linear  density at the right lung base is suggestive for scar. The heart  and mediastinum are stable. Slightly increased interstitial  densities in the right mid chest are similar to the prior  examination. No focal airspace disease.  IMPRESSION:  Stable chest radiograph findings. No acute changes.  Original Report Authenticated By: Richarda Overlie, M.D.   10/13/11- 78 year old female former smoker followed for history of right upper lobe cancer, COPD, allergic rhinitis. Here with husband Always has SOB, wheezing, cough, and congestion-has gotten worse due to pollen-eyes bothering her and runny nose(sneezing) Increased congestion in head and nose. She had had a pneumonia in December and then had fallen at home in January, bruising her face. Using nebulizer twice a day. Complains portable oxygen is too heavy and is thinking about changing to [facility]  Review of Systems-see HPI Constitutional:   No-   weight loss, night sweats, , chills, fatigue, lassitude. HEENT:   No-  headaches, difficulty swallowing, tooth/dental problems, +sore throat,     No-  sneezing, itching, ear ache, +nasal congestion, post nasal drip,  CV:  +  chest pain, orthopnea, PND, swelling in lower extremities, anasarca, dizziness, palpitations Resp:    +shortness of breath with exertion or at  rest.                 +productive cough,   non-productive cough,    coughing up of blood- trace.              some   change in color of mucus.    Skin: No-   rash or lesions. GI:   No-   heartburn, indigestion, abdominal pain, nausea, vomiting,  GU:  MS:  No-   joint pain or swelling.   Neuro- nothing unusual Psych:  No- change in mood or affect. No depression or anxiety.  No memory loss.     Objective:   Physical Exam General- Alert, Oriented, Affect-appropriate, Distress- none acute, but   ill appearing ;  O2 2L  96% at rest Skin- rash-none, lesions- none, excoriation- none Lymphadenopathy- none Head- atraumatic            Eyes- Gross vision intact, PERRLA, conjunctivae clear secretions            Ears- Hearing, canals normal            Nose- Clear, no-Septal dev, mucus, polyps, erosion, perforation             Throat- Mallampati IV , mucosa clear , drainage- none, tonsils- atrophic Neck- flexible , trachea midline, no stridor , thyroid nl, carotid no bruit Chest - symmetrical excursion , unlabored           Heart/CV- RRR , no murmur , no gallop  , no rub, nl s1 s2                           - JVD- none , edema- none, stasis changes- none, varices- none           Lung- bilateral wheeze, slight cough , dullness-none, rub- none           Chest wall-  Abd-  Br/ Gen/ Rectal- Not done, not indicated Extrem- cyanosis- none, clubbing, none, atrophy- none, strength- nl Neuro- grossly intact to observation

## 2011-10-13 NOTE — Telephone Encounter (Signed)
Called pt, left message, regarding Chest xray , same day as lab, also reminded pt of appt with MD after a few days.

## 2011-10-15 NOTE — Assessment & Plan Note (Signed)
Seasonal allergic rhinitis due to concurrent elevated tree and grass pollens. Plan-Depo-Medrol

## 2011-10-15 NOTE — Assessment & Plan Note (Signed)
Exacerbation of COPD reflecting pollens. I don't think she has an infection. Plan-Depo-Medrol

## 2011-10-17 ENCOUNTER — Ambulatory Visit: Payer: Medicare Other | Admitting: Internal Medicine

## 2011-10-19 ENCOUNTER — Ambulatory Visit (INDEPENDENT_AMBULATORY_CARE_PROVIDER_SITE_OTHER): Payer: Medicare Other | Admitting: Internal Medicine

## 2011-10-19 ENCOUNTER — Encounter: Payer: Self-pay | Admitting: Internal Medicine

## 2011-10-19 ENCOUNTER — Ambulatory Visit (INDEPENDENT_AMBULATORY_CARE_PROVIDER_SITE_OTHER)
Admission: RE | Admit: 2011-10-19 | Discharge: 2011-10-19 | Disposition: A | Discharge status: Home / Self Care | Payer: Medicare Other | Source: Ambulatory Visit | Attending: Internal Medicine | Admitting: Internal Medicine

## 2011-10-19 VITALS — BP 122/70 | HR 77 | Temp 97.9°F | Ht 66.0 in | Wt 164.7 lb

## 2011-10-19 DIAGNOSIS — E785 Hyperlipidemia, unspecified: Secondary | ICD-10-CM

## 2011-10-19 DIAGNOSIS — M542 Cervicalgia: Secondary | ICD-10-CM

## 2011-10-19 DIAGNOSIS — E039 Hypothyroidism, unspecified: Secondary | ICD-10-CM

## 2011-10-19 MED ORDER — CYCLOBENZAPRINE HCL 5 MG PO TABS: 2.5000 mg | ORAL_TABLET | Freq: Every evening | ORAL | Status: DC | PRN

## 2011-10-19 NOTE — Patient Instructions (Signed)
It was good to see you today. We have reviewed your interval medical history including labs and tests today. Medications reviewed, use low dose muscle relaxer for your neck pain at night as needed -no other changes at this time. Test(s) xray neck ordered today. Your results will be called to you after review (48-72hours after test completion). If any changes need to be made, you will be notified at that time. Your prescription(s) have been submitted to your pharmacy. Please take as directed and contact our office if you believe you are having problem(s) with the medication(s). Please schedule followup in 3-4 months for thyroid and cholesterol check, call sooner if problems.

## 2011-10-19 NOTE — Assessment & Plan Note (Signed)
Prev on Lipitor - stopped due to myalgia - increase lipids prompted resume of med tx - then crestor caused muscle fatigue so decrease dose (protected by high HDL) resumed generic Lipitor 07/2011 now due to $$ issues 01/2011 labs reviewed with pt/family today

## 2011-10-19 NOTE — Progress Notes (Signed)
  Subjective:    Patient ID: [name], female    DOB: Dec 21, 1932, 78 y.o.   MRN: [id number]  HPI  Here for follow up- reviewed chronic medical issues:  COPD - recent flares reviewed, follows with pulm for same - wearing O2 qhs - the patient reports compliance with medication(s) as prescribed. Denies adverse side effects.  Dyslipidemia - on statin - the patient reports compliance with medication(s) as prescribed. Denies adverse side effects. Request change to generic  Hypothyroid - the patient reports compliance with medication(s) as prescribed. Denies adverse side effects.  accidental fall x 2 06/28/11 and 06/10/11 - no falls in interim  Frequent orthostatic symptoms with position changes> but intolerant of prior midodrine trial No confusion, vision changes, no incontinence or seizure - no loss of consciousness before or during fall - residual left sided neck spasm and tightness    Past Medical History  Diagnosis Date  . Allergic rhinitis   . COPD (chronic obstructive pulmonary disease)   . BC (bronchogenic carcinoma) 2005    Resected RUL  . Pelvic cyst   . Bladder atony   . Unspecified hypothyroidism   . Hyperlipidemia     Review of Systems  Constitutional: Negative for fever and fatigue.  Respiratory: Negative for cough and choking.   Cardiovascular: Negative for chest pain and leg swelling.  Musculoskeletal: Negative for joint swelling and gait problem.       Objective:   Physical Exam  BP 122/70  Pulse 77  Temp(Src) 97.9 F (36.6 C) (Oral)  Ht 5\' 6"  (1.676 m)  Wt 164 lb 11.2 oz (74.707 kg)  BMI 26.58 kg/m2  SpO2 91% Wt Readings from Last 3 Encounters:  10/19/11 164 lb 11.2 oz (74.707 kg)  10/13/11 170 lb 9.6 oz (77.384 kg)  08/03/11 169 lb 1.9 oz (76.712 kg)   Constitutional: She appears well-developed and well-nourished. No distress. Spouse at side Neck: Normal range of motion. Neck supple, but mild spasm on L side. No JVD present. No thyromegaly  present.  Cardiovascular: Normal rate, regular rhythm and normal heart sounds.  No murmur heard. No BLE edema. Pulmonary/Chest: Effort normal and breath sounds diminished. No respiratory distress. She has mild insp and exp wheezes.  Neuro: AAOx4, CN2-12,  Normal balance, speech fluent without dysarthria, follows commands and MAE - walks independent Psychiatric: She has a minimally anxious mood and affect. Her behavior is normal. Judgment and thought content normal.   Lab Results  Component Value Date   WBC 8.0 06/27/2011   HGB 12.7 06/27/2011   HCT 38.3 06/27/2011   PLT 225.0 06/27/2011   CHOL 202* 01/28/2011   TRIG 92.0 01/28/2011   HDL 121.20 01/28/2011   LDLDIRECT 66.9 01/28/2011   ALT 32 01/28/2011   AST 34 01/28/2011   NA 140 06/29/2011   K 4.2 06/29/2011   CL 99 06/29/2011   CREATININE 0.8 06/29/2011   BUN 9 06/29/2011   CO2 34* 06/29/2011   TSH 0.76 06/29/2011   INR 0.94 02/05/2010   HGBA1C 5.4 01/28/2011      Assessment & Plan:   See problem list. Medications and labs reviewed today.  Neck pain - suspect spasm related to probable DDD - given falls 06/2011, will check DG c-spine. Add low dose muscle relaxer qhs prn

## 2011-10-19 NOTE — Assessment & Plan Note (Signed)
Lab Results  Component Value Date   TSH 0.76 06/29/2011   The current medical regimen is effective;  continue present plan and medications.  

## 2011-11-16 ENCOUNTER — Other Ambulatory Visit: Payer: Self-pay | Admitting: Internal Medicine

## 2011-11-16 NOTE — Telephone Encounter (Signed)
[facility]ed [facility] to [facility]... 11/16/11@2 :24pm/LMB

## 2011-11-21 ENCOUNTER — Telehealth: Payer: Self-pay | Admitting: Internal Medicine

## 2011-11-21 MED ORDER — DOXYCYCLINE HYCLATE 100 MG PO TABS: ORAL_TABLET | ORAL | Status: DC

## 2011-11-21 NOTE — Telephone Encounter (Signed)
Per CY-okay to give Doxycycline 100 mg #8 take 2 today then 1 daily no refills.    Pt is aware of Rx sent to pharmacy.

## 2011-11-21 NOTE — Telephone Encounter (Signed)
Called and spoke with pt and she stated that her symptoms started yesterday---wheezing, cough with some light yellow sputum.  Denies any fever.   Pt is requesting an abx.  CY please advise. Thanks  Allergies  Allergen Reactions  . Daliresp (Roflumilast) Nausea Only  . Tiotropium Bromide Monohydrate     Urinary retention  . Ampicillin     REACTION: GI upset  . Azithromycin     REACTION: diarrhea  . Codeine   . Fludrocortisone Acetate Itching  . Nitrofurantoin     REACTION: Upset GI  . Penicillins   . Sulfonamide Derivatives

## 2011-12-07 ENCOUNTER — Encounter: Payer: Self-pay | Admitting: Internal Medicine

## 2011-12-07 ENCOUNTER — Ambulatory Visit (INDEPENDENT_AMBULATORY_CARE_PROVIDER_SITE_OTHER): Payer: Medicare Other | Admitting: Internal Medicine

## 2011-12-07 VITALS — BP 140/82 | HR 84 | Temp 98.2°F | Ht 65.0 in | Wt 169.8 lb

## 2011-12-07 DIAGNOSIS — R300 Dysuria: Secondary | ICD-10-CM

## 2011-12-07 MED ORDER — PROMETHAZINE HCL 25 MG PO TABS: 25.0000 mg | ORAL_TABLET | Freq: Four times a day (QID) | ORAL | Status: DC | PRN

## 2011-12-07 MED ORDER — CIPROFLOXACIN HCL 250 MG PO TABS: 250.0000 mg | ORAL_TABLET | Freq: Two times a day (BID) | ORAL | Status: AC

## 2011-12-07 NOTE — Progress Notes (Signed)
  Subjective:    Patient ID: [name], female    DOB: 04-Jun-1933, 78 y.o.   MRN: [id number]  HPI  complains of freq urination Onset 5 days ago Typical of prior UTI symptoms  Unable to give sample for testing  Past Medical History  Diagnosis Date  . Allergic rhinitis   . COPD (chronic obstructive pulmonary disease)   . BC (bronchogenic carcinoma) 2005    Resected RUL  . Pelvic cyst   . Bladder atony   . Unspecified hypothyroidism   . Hyperlipidemia      Review of Systems  Constitutional: Positive for fatigue. Negative for fever.  Genitourinary: Positive for urgency, frequency and flank pain. Negative for hematuria and pelvic pain.  Musculoskeletal: Negative for joint swelling.       Objective:   Physical Exam BP 140/82  Pulse 84  Temp 98.2 F (36.8 C) (Oral)  Ht 5\' 5"  (1.651 m)  Wt 169 lb 12.8 oz (77.021 kg)  BMI 28.26 kg/m2  SpO2 98% Constitutional: She appears well-developed and well-nourished. No distress. spouse at side Neck: Normal range of motion. Neck supple. No JVD present. No thyromegaly present.  Cardiovascular: Normal rate, regular rhythm and normal heart sounds.  No murmur heard. No BLE edema. Pulmonary/Chest: Effort normal and breath sounds normal. No respiratory distress. She has no wheezes.  Abdominal: Soft. Bowel sounds are normal. She exhibits no distension. There is no tenderness. no masses Skin: Skin is warm and dry. No rash noted. No erythema.      Assessment & Plan:  Dysuria - classic UTI symptoms with hx same Empiric cipro x 7 d To call if unimproved, sooner if worse for Ua/Ucx as needed

## 2011-12-07 NOTE — Patient Instructions (Addendum)
It was good to see you today. Cipro antibiotics 2x/day x 1 week for UTI symptoms  Also refill on phenergan as needed for nausea -  Your prescription(s) have been submitted to your pharmacy. Please take as directed and contact our office if you believe you are having problem(s) with the medication(s). Other Medications reviewed, no changes at this time.

## 2011-12-12 ENCOUNTER — Telehealth: Payer: Self-pay

## 2011-12-12 ENCOUNTER — Other Ambulatory Visit (INDEPENDENT_AMBULATORY_CARE_PROVIDER_SITE_OTHER): Payer: Medicare Other

## 2011-12-12 DIAGNOSIS — N390 Urinary tract infection, site not specified: Secondary | ICD-10-CM

## 2011-12-12 LAB — URINALYSIS, ROUTINE W REFLEX MICROSCOPIC
Bilirubin Urine: NEGATIVE
Leukocytes, UA: NEGATIVE
Nitrite: NEGATIVE
Total Protein, Urine: NEGATIVE
pH: 6.5 (ref 5.0–8.0)

## 2011-12-12 NOTE — Telephone Encounter (Signed)
Pt called stating there has been no improvement in urinary sxs since OV - c/o urinary pressure with urgency.  Pt is requesting advisement from MD, does pt need UA/Ucx per last OV?

## 2011-12-12 NOTE — Telephone Encounter (Signed)
yes - Ua/Ucx please- will determine if different abx needed after review of same - thanks

## 2011-12-12 NOTE — Telephone Encounter (Signed)
Notified pt with md response & labs entered in epic... 12/12/11@ 2:31pm/LMB

## 2011-12-14 LAB — CULTURE, URINE COMPREHENSIVE: Organism ID, Bacteria: NO GROWTH

## 2011-12-15 ENCOUNTER — Other Ambulatory Visit: Payer: Self-pay

## 2011-12-15 DIAGNOSIS — R109 Unspecified abdominal pain: Secondary | ICD-10-CM

## 2011-12-15 MED ORDER — HYDROCODONE-ACETAMINOPHEN 5-325 MG PO TABS: 1.0000 | ORAL_TABLET | Freq: Three times a day (TID) | ORAL | Status: DC | PRN

## 2011-12-15 NOTE — Telephone Encounter (Signed)
Pt advised of Rx (Called in) and CT scan

## 2011-12-15 NOTE — Telephone Encounter (Signed)
i have ordered CT scan to eval L flank pain - also Norco may be called in (listed on Meds but not sent yet) to use as needed for pain symptoms

## 2011-12-15 NOTE — Telephone Encounter (Signed)
Pt called stating she is still experiencing LT sided flank pain. Pt is concerned that UTI has not resolved or pain may be symptom of something worse that a UTI. Pt is requesting advisement from MD.

## 2011-12-16 ENCOUNTER — Telehealth: Payer: Self-pay | Admitting: Internal Medicine

## 2011-12-16 ENCOUNTER — Other Ambulatory Visit: Payer: Self-pay | Admitting: Internal Medicine

## 2011-12-16 DIAGNOSIS — Z79899 Other long term (current) drug therapy: Secondary | ICD-10-CM

## 2011-12-16 NOTE — Telephone Encounter (Signed)
Dr Felicity Coyer put need current labs. Pt is scheduled for  CT  12/21/11 @ 10am. Patient is informed of appt and that she need to come in and have labs done.

## 2011-12-19 ENCOUNTER — Other Ambulatory Visit (INDEPENDENT_AMBULATORY_CARE_PROVIDER_SITE_OTHER): Payer: Medicare Other

## 2011-12-19 ENCOUNTER — Telehealth: Payer: Self-pay | Admitting: *Deleted

## 2011-12-19 ENCOUNTER — Telehealth: Payer: Self-pay

## 2011-12-19 DIAGNOSIS — Z79899 Other long term (current) drug therapy: Secondary | ICD-10-CM

## 2011-12-19 LAB — BASIC METABOLIC PANEL
CO2: 30 mEq/L (ref 19–32)
Calcium: 9.8 mg/dL (ref 8.4–10.5)
Creatinine, Ser: 0.7 mg/dL (ref 0.4–1.2)
GFR: 80.95 mL/min (ref >60.00)
Glucose, Bld: 99 mg/dL (ref 70–99)
Sodium: 134 mEq/L — ABNORMAL LOW (ref 135–145)

## 2011-12-19 NOTE — Telephone Encounter (Signed)
Pt called stating that she is concerned that LT side pains she has been experiencing could be a return of a LT pelvic cyst she has 07/2009. Pt is requesting advisement from MD.

## 2011-12-19 NOTE — Telephone Encounter (Signed)
Received call from anita in the lab. Pt is there to have labs done no orders in computer. Having CTdoen on 12/21/11. Will place order for Bmet... 12/19/11@10 :43/LMB

## 2011-12-19 NOTE — Telephone Encounter (Signed)
Noted - already entered - thx

## 2011-12-20 NOTE — Telephone Encounter (Signed)
The scheduled CT should be able to help [place] determine if there is recurrent cyst - no tx change at this time recommended

## 2011-12-20 NOTE — Telephone Encounter (Signed)
Pt advised.

## 2011-12-21 ENCOUNTER — Ambulatory Visit (INDEPENDENT_AMBULATORY_CARE_PROVIDER_SITE_OTHER)
Admission: RE | Admit: 2011-12-21 | Discharge: 2011-12-21 | Disposition: A | Discharge status: Home / Self Care | Payer: Medicare Other | Source: Ambulatory Visit | Attending: Internal Medicine | Admitting: Internal Medicine

## 2011-12-21 DIAGNOSIS — R109 Unspecified abdominal pain: Secondary | ICD-10-CM

## 2011-12-21 MED ORDER — IOHEXOL 300 MG/ML  SOLN
100.0000 mL | Freq: Once | INTRAMUSCULAR | Status: AC | PRN
Start: 2011-12-21 — End: 2011-12-21
  Administered 2011-12-21: 100 mL via INTRAVENOUS

## 2011-12-27 ENCOUNTER — Telehealth: Payer: Self-pay | Admitting: Internal Medicine

## 2011-12-27 ENCOUNTER — Ambulatory Visit: Payer: Medicare Other | Admitting: Internal Medicine

## 2011-12-27 NOTE — Telephone Encounter (Signed)
Caller: Leeba/Patient; PCP: Rene Paci; CB#: [phone]; Call regarding Abdominal Pain On Left Side; Pain worse getting up and down/ with movement.  States pain takes her breath when getting up or down; rates it at 9. No pain at night; it starts when she gets up and starts moving.  Asks what further tests can be done.  Last seen 6/19. See in 72 hours per Abdominal Pain protocol as patient has been previously evaluated for this pain. Appt. at 11:15 on 12/29/11 with Dr. Felicity Coyer.

## 2011-12-29 ENCOUNTER — Ambulatory Visit (INDEPENDENT_AMBULATORY_CARE_PROVIDER_SITE_OTHER): Payer: Medicare Other | Admitting: Internal Medicine

## 2011-12-29 ENCOUNTER — Encounter: Payer: Self-pay | Admitting: Internal Medicine

## 2011-12-29 ENCOUNTER — Ambulatory Visit (INDEPENDENT_AMBULATORY_CARE_PROVIDER_SITE_OTHER)
Admission: RE | Admit: 2011-12-29 | Discharge: 2011-12-29 | Disposition: A | Discharge status: Home / Self Care | Payer: Medicare Other | Source: Ambulatory Visit | Attending: Internal Medicine | Admitting: Internal Medicine

## 2011-12-29 VITALS — BP 128/68 | HR 86 | Temp 97.4°F | Ht 65.0 in | Wt 169.1 lb

## 2011-12-29 DIAGNOSIS — M25559 Pain in unspecified hip: Secondary | ICD-10-CM

## 2011-12-29 DIAGNOSIS — M25552 Pain in left hip: Secondary | ICD-10-CM

## 2011-12-29 NOTE — Patient Instructions (Addendum)
It was good to see you today. We have reviewed your interval records including labs and tests today Test(s) ordered today - hip xray. Your results will be called to you after review (48-72hours after test completion). If any changes need to be made, you will be notified at that time. If hip is unremarkable on xray, will look into back as cause of pain Medications reviewed, no changes at this time. Call if refills needed

## 2011-12-29 NOTE — Progress Notes (Signed)
Subjective:    Patient ID: [name], female    DOB: January 18, 1933, 78 y.o.   MRN: [id number]  HPI  complains continued LLQ pain Ongoing >6 weeks Present daily but not constant pain Initially releived with tylenol - now requiring rx norco (1/2 tab prn) Improve with rest and lying supine - no pain until getting out of bed in AM Worse with standing from seated position and certain "bend forward" actions Not improved with treatment for UTI and CT a/p unremarkable No back pain, fall on left side 06/2011  Past Medical History  Diagnosis Date  . Allergic rhinitis   . COPD (chronic obstructive pulmonary disease)   . Pelvic cyst   . Bladder atony   . Unspecified hypothyroidism   . Hyperlipidemia   . BC (bronchogenic carcinoma) 2005    Resected RUL     Review of Systems  Constitutional: Positive for fatigue. Negative for fever.  Gastrointestinal: Positive for abdominal pain.  Genitourinary: Positive for urgency. Negative for frequency, hematuria, flank pain and pelvic pain.  Musculoskeletal: Positive for gait problem. Negative for back pain and joint swelling.       Objective:   Physical Exam  BP 128/68  Pulse 86  Temp 97.4 F (36.3 C) (Oral)  Ht 5\' 5"  (1.651 m)  Wt 169 lb 1.9 oz (76.712 kg)  BMI 28.14 kg/m2  SpO2 95% Constitutional: She appears well-developed and well-nourished. No distress. spouse at side Neck: Normal range of motion. Neck supple. No JVD present. No thyromegaly present.  Cardiovascular: Normal rate, regular rhythm and normal heart sounds.  No murmur heard. No BLE edema. Pulmonary/Chest: Effort normal and breath sounds normal. No respiratory distress. She has no wheezes.  Abdominal: Soft. Bowel sounds are normal. She exhibits no distension. There is no tenderness. no masses MSkel: L hip tender to palpation over groin - but normal FROM including internal/external rotation - gait normal Skin: Skin is warm and dry. No rash noted. No erythema.  Lab  Results  Component Value Date   WBC 8.0 06/27/2011   HGB 12.7 06/27/2011   HCT 38.3 06/27/2011   PLT 225.0 06/27/2011   GLUCOSE 99 12/19/2011   CHOL 202* 01/28/2011   TRIG 92.0 01/28/2011   HDL 121.20 01/28/2011   LDLDIRECT 66.9 01/28/2011   LDLCALC 71 10/30/2009   ALT 32 01/28/2011   AST 34 01/28/2011   NA 134* 12/19/2011   K 4.2 12/19/2011   CL 95* 12/19/2011   CREATININE 0.7 12/19/2011   BUN 8 12/19/2011   CO2 30 12/19/2011   TSH 0.76 06/29/2011   INR 0.94 02/05/2010   HGBA1C 5.4 01/28/2011   Ct Abdomen Pelvis W Contrast  12/21/2011  *RADIOLOGY REPORT*  Clinical Data: Abdominal and pelvic pain.  CT ABDOMEN AND PELVIS WITH CONTRAST  Technique:  Multidetector CT imaging of the abdomen and pelvis was performed following the standard protocol during bolus administration of intravenous contrast.  Contrast: OMNIPAQUE IOHEXOL 300 MG/ML  SOLN  Comparison: 07/23/2010.  Findings: The lung bases demonstrate stable emphysematous changes, surgical changes and chronic pleural thickening.  A small hiatal hernia is stable.  The stomach is not well distended but no gross abnormalities are seen.  The duodenum, small bowel and colon are unremarkable except for stable advanced diverticulosis of the sigmoid colon.  The liver is unremarkable and stable.  No worrisome hepatic lesions or intrahepatic ductal dilatation.  Tiny cysts are unchanged.  The gallbladder is unremarkable.  No common bile duct dilatation.  The pancreas  is normal and stable.  The spleen is normal in size.  No focal lesions.  The adrenal glands and kidneys demonstrate no significant findings.  There are dense atherosclerotic calcifications involving the aorta and branch vessels but no focal aneurysm or dissection.  No mesenteric or retroperitoneal masses or adenopathy.  The uterus is surgically absent.  The bladder appears normal.  No pelvic mass, adenopathy or free pelvic fluid collections.  No inguinal mass or hernia.  Stable degenerative changes and osteoporosis  involving the spine.  IMPRESSION:  1.  Chronic emphysematous changes and right basilar surgical changes, scarring and pleural thickening. 2.  No acute abdominal/pelvic findings, mass lesions or adenopathy.  Original Report Authenticated By: P. Loralie Champagne, M.D.     Assessment & Plan:  L groin pain - ?hip source Check DG today - consider MRI vs back eval if hip appears unremarkable Continue ongoing pain meds Labs/imaging results reviewed in depth with pt/spouse

## 2011-12-30 NOTE — Addendum Note (Signed)
Addended by: Rene Paci A on: 12/30/2011 08:17 AM   Modules accepted: Orders

## 2012-01-03 ENCOUNTER — Other Ambulatory Visit: Payer: Self-pay | Admitting: Internal Medicine

## 2012-01-03 NOTE — Telephone Encounter (Signed)
Faxed script back to [facility]... 01/03/12@2 :34pm/LMB

## 2012-01-06 ENCOUNTER — Other Ambulatory Visit: Payer: Self-pay | Admitting: Oncology

## 2012-01-06 ENCOUNTER — Other Ambulatory Visit (HOSPITAL_BASED_OUTPATIENT_CLINIC_OR_DEPARTMENT_OTHER): Payer: Medicare Other | Admitting: Lab

## 2012-01-06 ENCOUNTER — Ambulatory Visit (HOSPITAL_COMMUNITY)
Admission: RE | Admit: 2012-01-06 | Discharge: 2012-01-06 | Disposition: A | Discharge status: Home / Self Care | Payer: Medicare Other | Source: Ambulatory Visit | Attending: Oncology | Admitting: Oncology

## 2012-01-06 DIAGNOSIS — Z85118 Personal history of other malignant neoplasm of bronchus and lung: Secondary | ICD-10-CM

## 2012-01-06 DIAGNOSIS — N83209 Unspecified ovarian cyst, unspecified side: Secondary | ICD-10-CM

## 2012-01-06 DIAGNOSIS — R0602 Shortness of breath: Secondary | ICD-10-CM | POA: Insufficient documentation

## 2012-01-06 DIAGNOSIS — J449 Chronic obstructive pulmonary disease, unspecified: Secondary | ICD-10-CM

## 2012-01-06 DIAGNOSIS — C3490 Malignant neoplasm of unspecified part of unspecified bronchus or lung: Secondary | ICD-10-CM | POA: Insufficient documentation

## 2012-01-06 DIAGNOSIS — J4489 Other specified chronic obstructive pulmonary disease: Secondary | ICD-10-CM

## 2012-01-06 DIAGNOSIS — E039 Hypothyroidism, unspecified: Secondary | ICD-10-CM

## 2012-01-06 LAB — COMPREHENSIVE METABOLIC PANEL
AST: 28 U/L (ref 0–37)
Albumin: 4.2 g/dL (ref 3.5–5.2)
Alkaline Phosphatase: 101 U/L (ref 39–117)
BUN: 10 mg/dL (ref 6–23)
Calcium: 9.8 mg/dL (ref 8.4–10.5)
Chloride: 96 mEq/L (ref 96–112)
Potassium: 4.4 mEq/L (ref 3.5–5.3)
Sodium: 136 mEq/L (ref 135–145)
Total Protein: 7.5 g/dL (ref 6.0–8.3)

## 2012-01-06 LAB — CBC WITH DIFFERENTIAL/PLATELET
Basophils Absolute: 0 10*3/uL (ref 0.0–0.1)
EOS%: 0.2 % (ref 0.0–7.0)
Eosinophils Absolute: 0 10*3/uL (ref 0.0–0.5)
HGB: 12.7 g/dL (ref 11.6–15.9)
MCH: 32.2 pg (ref 25.1–34.0)
MONO%: 3.7 % (ref 0.0–14.0)
NEUT#: 5.6 10*3/uL (ref 1.5–6.5)
RBC: 3.96 10*6/uL (ref 3.70–5.45)
RDW: 13.7 % (ref 11.2–14.5)
lymph#: 0.8 10*3/uL — ABNORMAL LOW (ref 0.9–3.3)

## 2012-01-10 ENCOUNTER — Encounter: Payer: Self-pay | Admitting: Oncology

## 2012-01-10 ENCOUNTER — Telehealth: Payer: Self-pay | Admitting: *Deleted

## 2012-01-10 ENCOUNTER — Ambulatory Visit (HOSPITAL_BASED_OUTPATIENT_CLINIC_OR_DEPARTMENT_OTHER): Payer: Medicare Other | Admitting: Oncology

## 2012-01-10 VITALS — BP 131/76 | HR 85 | Temp 98.3°F | Ht 65.0 in | Wt 170.4 lb

## 2012-01-10 DIAGNOSIS — C3410 Malignant neoplasm of upper lobe, unspecified bronchus or lung: Secondary | ICD-10-CM

## 2012-01-10 DIAGNOSIS — C3490 Malignant neoplasm of unspecified part of unspecified bronchus or lung: Secondary | ICD-10-CM

## 2012-01-10 NOTE — Progress Notes (Signed)
Hematology and Oncology Follow Up Visit  [name] [id number] 06-Oct-1932 78 y.o. 01/10/2012 3:05 PM   Principle Diagnosis: Encounter Diagnoses  Name Primary?  . Lung cancer, upper lobe Yes  . 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 She is followed with annual exams and chest radiographs and has had no signs of recurrence now out over 8 years. She has oxygen-dependent obstructive airway disease with frequent bouts of bronchitis. She had a episode of "walking pneumonia" back in December 2012 treated as an outpatient. She tells me that in January she had a syncopal episode and fell down and struck her face had a very large ecchymosis. A CT scan of the brain without contrast on 06/30/2011 showed some soft tissue swelling around the left orbit but no evidence for hematoma or tumor. Since that time she has had other problems with atypical left groin pain which she initially related to a recurrent ovarian cyst;  further evaluation was unremarkable:  CT scan of the abdomen and pelvis done 12/21/2011 showed no pathology in the abdomen or pelvis.[name] She was told that she likely has a radiculopathy. She was referred to orthopedic surgery Dr. Charlett Blake and was recently put on a trial of steroids with some improvement in her symptoms.  Medications: reviewed  Allergies:  Allergies  Allergen Reactions  . Daliresp (Roflumilast) Nausea Only  . Tiotropium Bromide Monohydrate     Urinary retention  . Ampicillin     REACTION: GI upset  . Azithromycin     REACTION: diarrhea  . Codeine   . Fludrocortisone Acetate Itching  . Nitrofurantoin     REACTION: Upset GI  . Penicillins   . Sulfonamide Derivatives     Review of Systems: Constitutional:   Chronic fatigue especially when she doesn't use her  oxygen Respiratory: Dyspnea on minimal exertion Cardiovascular:  No chest pain or palpitations Gastrointestinal: No abdominal pain no change in bowel habit Genito-Urinary: Not questioned  Musculoskeletal: Not questioned Neurologic: See above Skin: No rash Remaining ROS negative.  Physical Exam: Blood pressure 131/76, pulse 85, temperature 98.3 F (36.8 C), temperature source Oral, height 5\' 5"  (1.651 m), weight 170 lb 6.4 oz (77.293 kg). Wt Readings from Last 3 Encounters:  01/10/12 170 lb 6.4 oz (77.293 kg)  12/29/11 169 lb 1.9 oz (76.712 kg)  12/07/11 169 lb 12.8 oz (77.021 kg)     General appearance: Well-nourished Caucasian woman HENNT: Pharynx no erythema or exudate Lymph nodes: No adenopathy Breasts: Not examined Lungs: Diffuse decreased breath sounds, resonant to percussion throughout Heart: Regular rhythm no murmur Abdomen: Soft nontender no mass no organomegaly Extremities: No edema no calf tenderness Vascular: No cyanosis Neurologic: Pupils equal round reactive to light, motor strength is 5 over 5 all extremities, reflexes 1+ symmetric Skin: Scattered small ecchymoses on her forearms  Lab Results: Lab Results  Component Value Date   WBC 6.7 01/06/2012   HGB 12.7 01/06/2012   HCT 38.6 01/06/2012   MCV 97.6 01/06/2012   PLT 221 01/06/2012     Chemistry      Component Value Date/Time   NA 136 01/06/2012 1324   NA 143 12/21/2010 1206   K 4.4 01/06/2012 1324   K 4.0 12/21/2010 1206   CL 96 01/06/2012 1324   CL 97* 12/21/2010 1206   CO2  31 01/06/2012 1324   CO2 29 12/21/2010 1206   BUN 10 01/06/2012 1324   BUN 10 12/21/2010 1206   CREATININE 0.83 01/06/2012 1324   CREATININE 0.9 12/21/2010 1206      Component Value Date/Time   CALCIUM 9.8 01/06/2012 1324   CALCIUM 9.4 12/21/2010 1206   CALCIUM 9.9 06/29/2010 1628   ALKPHOS 101 01/06/2012 1324   ALKPHOS 74 12/21/2010 1206   AST 28 01/06/2012 1324   AST 34 12/21/2010 1206   ALT 15 01/06/2012 1324   BILITOT 0.5 01/06/2012 1324    BILITOT 0.70 12/21/2010 1206       Radiological Studies: Dg Chest 2 View  01/06/2012  *RADIOLOGY REPORT*  Clinical Data: History of lung cancer, shortness of breath, COPD  CHEST - 2 VIEW  Comparison: 06/27/2011; 05/24/2011; chest CT - 12/21/2010  Findings: Grossly unchanged cardiac silhouette and mediastinal contours.  There is chronic blunting of the right costophrenic angle with chronic right basilar linear heterogeneous opacities favored to represent subsegmental atelectasis or scar.  The lungs are hyperexpanded.  No new focal airspace opacities.  No pneumothorax.  Unchanged bones.  IMPRESSION: Stable emphysematous change and chronic postsurgical change of the right lung without definite acute cardiopulmonary disease.  Original Report Authenticated By: Waynard Reeds, M.D.   Dg Hip Complete Left  12/29/2011  *RADIOLOGY REPORT*  Clinical Data: Larey Seat in January with left hip pain  LEFT HIP - COMPLETE 2+ VIEW  Comparison: None.  Findings: There are mild degenerative changes in both hips with slight loss of joint space and superior acetabular spurring.  No acute fracture is seen.  The pelvic rami are intact.  The SI joints appear normal.  There are degenerative changes present in the lower lumbar spine.  Contrast is noted within multiple distal descending and sigmoid colonic diverticula.  IMPRESSION:  1.  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  No acute abnormality. 2.  Degenerative change is noted in the lower lumbar spine.  Original Report Authenticated By: Juline Patch, M.D.   Ct Abdomen Pelvis W Contrast  12/21/2011  *RADIOLOGY REPORT*  Clinical Data: Abdominal and pelvic pain.  CT ABDOMEN AND PELVIS WITH CONTRAST  Technique:  Multidetector CT imaging of the abdomen and pelvis was performed following the standard protocol during bolus administration of intravenous contrast.  Contrast: OMNIPAQUE IOHEXOL 300 MG/ML  SOLN  Comparison: 07/23/2010.  Findings: The lung bases demonstrate stable  emphysematous changes, surgical changes and chronic pleural thickening.  A small hiatal hernia is stable.  The stomach is not well distended but no gross abnormalities are seen.  The duodenum, small bowel and colon are unremarkable except for stable advanced diverticulosis of the sigmoid colon.  The liver is unremarkable and stable.  No worrisome hepatic lesions or intrahepatic ductal dilatation.  Tiny cysts are unchanged.  The gallbladder is unremarkable.  No common bile duct dilatation.  The pancreas is normal and stable.  The spleen is normal in size.  No focal lesions.  The adrenal glands and kidneys demonstrate no significant findings.  There are dense atherosclerotic calcifications involving the aorta and branch vessels but no focal aneurysm or dissection.  No mesenteric or retroperitoneal masses or adenopathy.  The uterus is surgically absent.  The bladder appears normal.  No pelvic mass, adenopathy or free pelvic fluid collections.  No inguinal mass or hernia.  Stable degenerative changes and osteoporosis involving the spine.  IMPRESSION:  1.  Chronic emphysematous changes and right basilar surgical changes, scarring  and pleural thickening. 2.  No acute abdominal/pelvic findings, mass lesions or adenopathy.  Original Report Authenticated By: P. Loralie Champagne, M.D.    Impression and Plan: #1. Stage I well differentiated squamous cell carcinoma right upper lung. She remains free of any obvious recurrence now out over 8 years following surgical resection will that he oh I will 1 and he that he come in today for Ms. and that is his incentive to go visit her again in with you and I think we [employer].  #2. Oxygen-dependent obstructive airway disease   #3. Hypothyroid on replacement  #4. History of a benign ovarian cyst status post previous aspiration procedure.  #5. Question lumbar radiculopathy   CC:. Dr. Rene Paci; Dr. Leda Quail, MD 7/23/20133:05 PM

## 2012-01-10 NOTE — Telephone Encounter (Signed)
gave patient appointment for 01-08-2013 at 2:00pm gave patient  appointment for 01-01-2013 at 2:00pm printed out calendar and gave to the pat ient

## 2012-01-15 IMAGING — CR DG CHEST 2V
2 series · 2 of 2 positions shown · non-contrast
Comparison: 11/13/2009

CLINICAL DATA: Chronic cough and shortness of breath.

CHEST - 2 VIEW

[w chest pa]
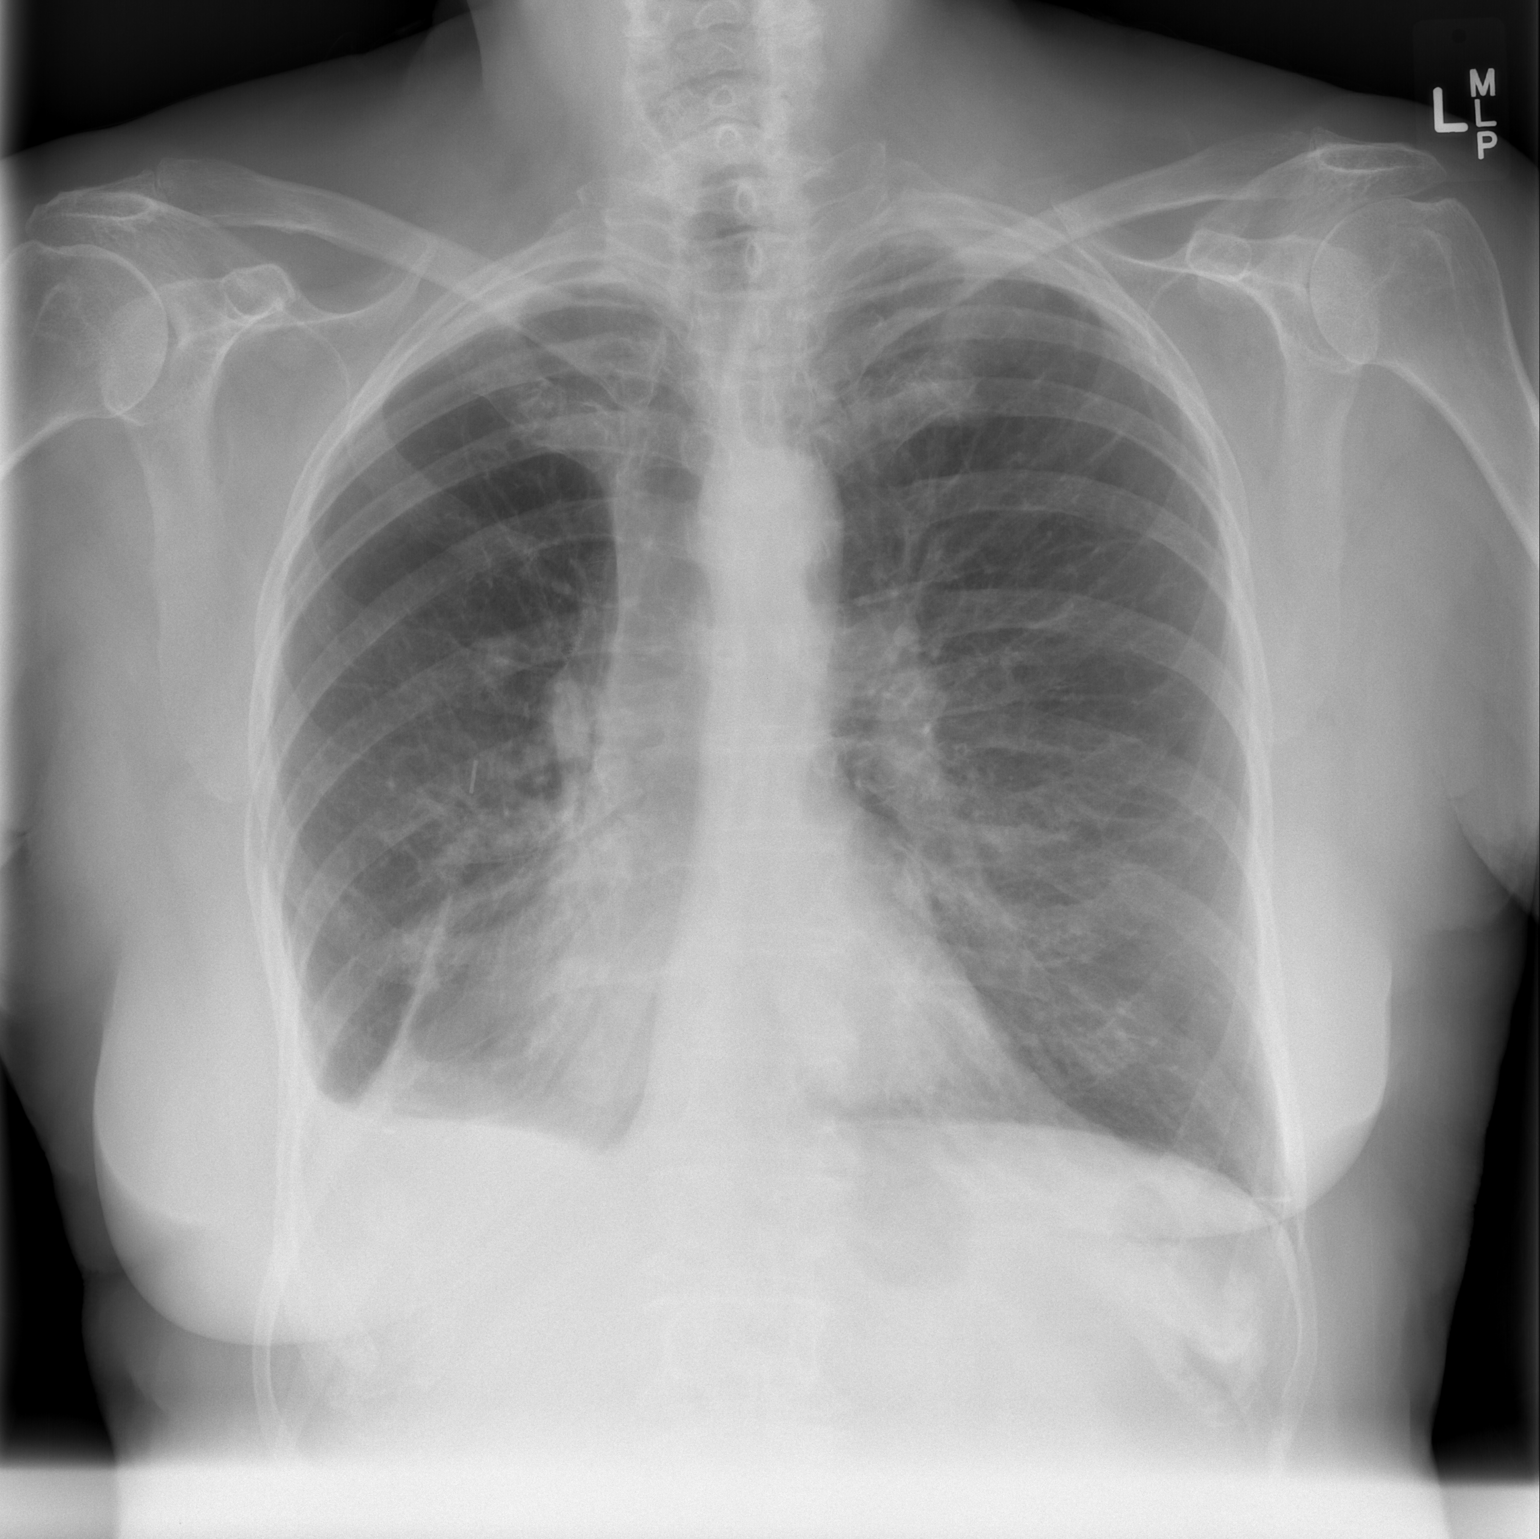

[w chest lat]
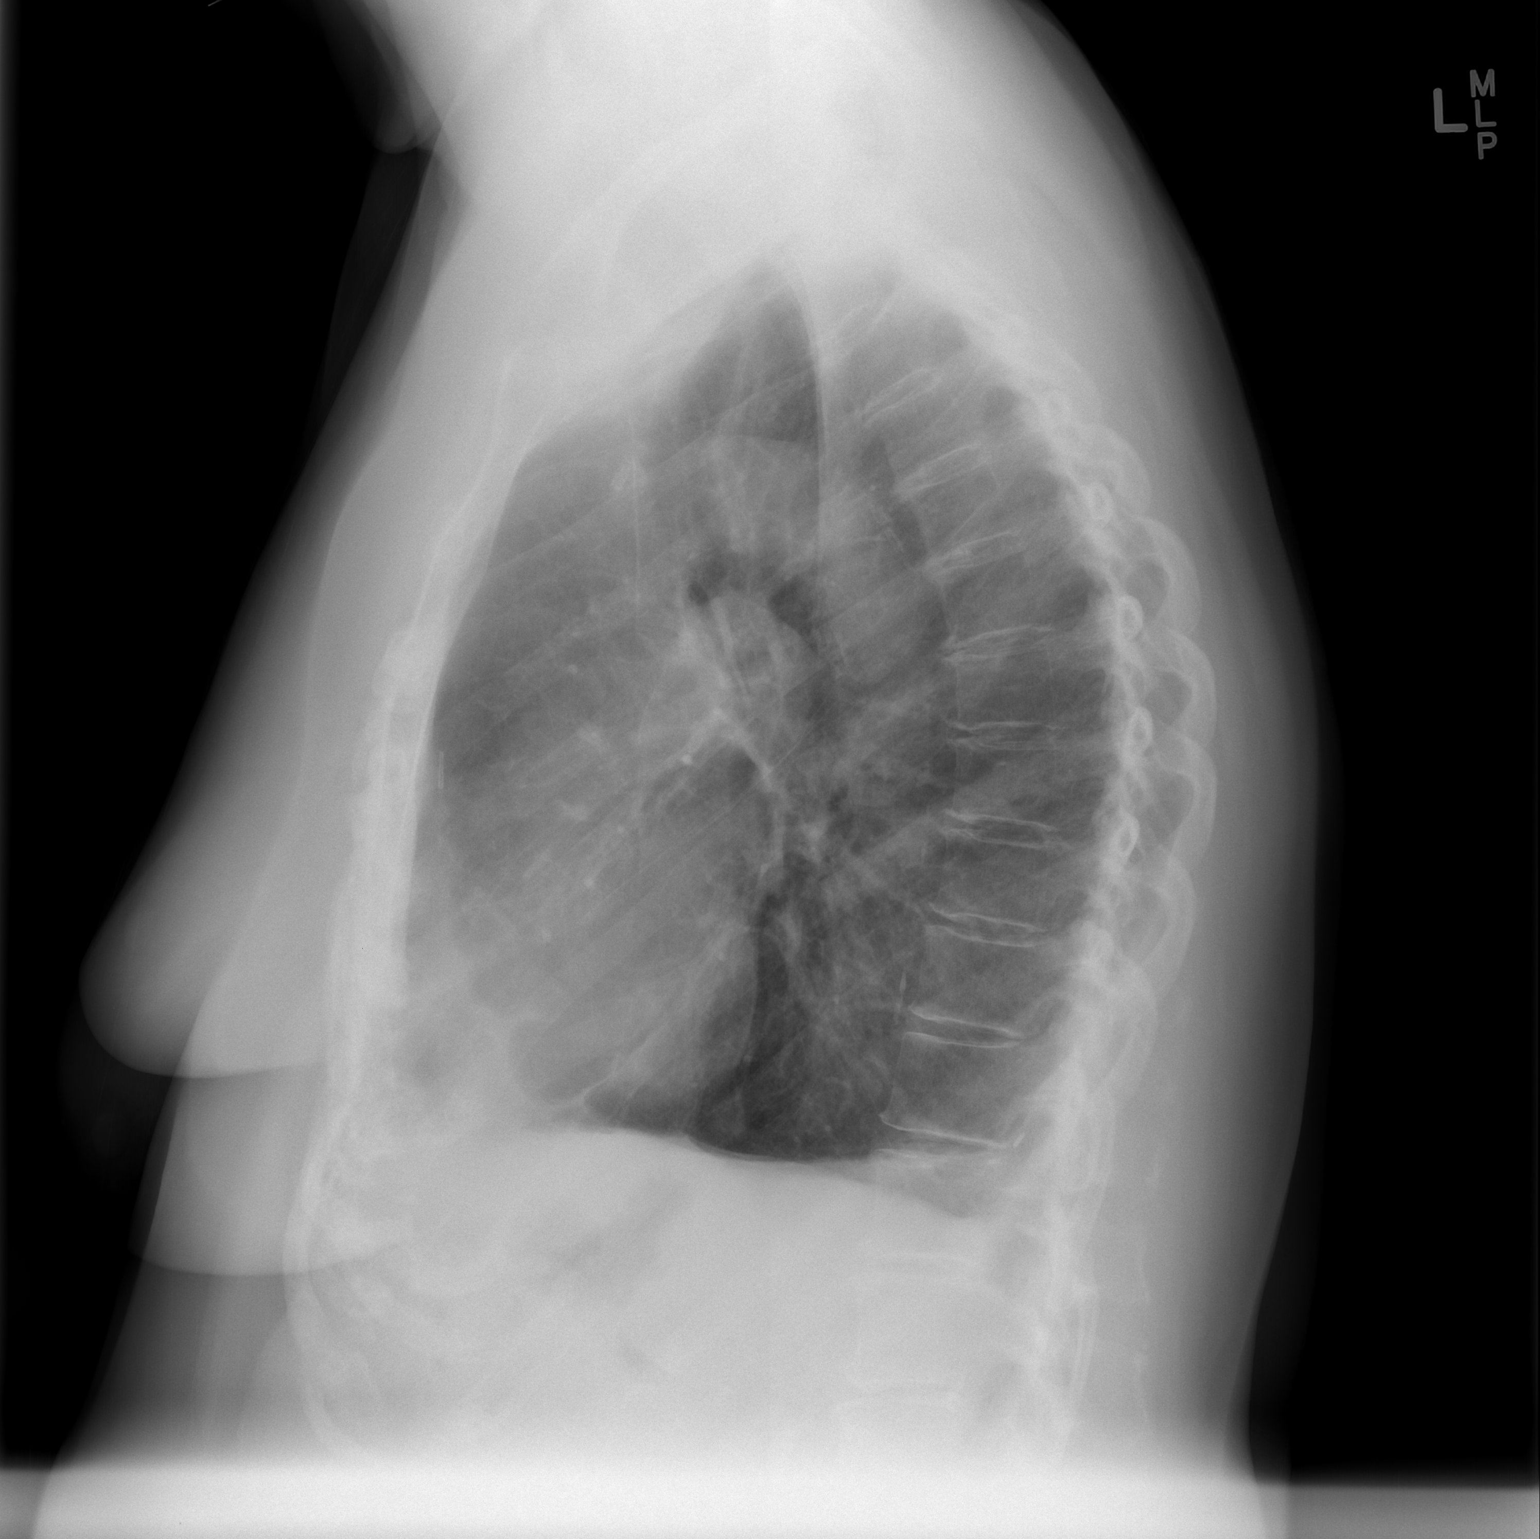

[2 of 2 positions shown; findings below may reference images not displayed]

FINDINGS: The cardiomediastinal silhouette is unremarkable.
Scarring in the right lower lung again noted.
COPD/emphysema again noted.
There is no evidence of focal airspace disease, pulmonary edema,
pulmonary nodule/mass, pleural effusion, or pneumothorax.
No acute bony abnormalities are identified.
IMPRESSION: No evidence of acute cardiopulmonary disease.

Right basilar scarring.

## 2012-01-25 ENCOUNTER — Other Ambulatory Visit: Payer: Self-pay | Admitting: Internal Medicine

## 2012-01-25 NOTE — Telephone Encounter (Signed)
Caller: Karcyn/Patient; PCP: Rene Paci; CB#: [phone]; ; ; Call regarding Back Pain; Seen by Dr. Charlett Blake; had MRI 01/24/12 and was contacted 01/25/12 and told she has a "bulging vertebra causing pressure on the nerve".  Has appointment at [facility] 01/30/12 but cannot be seen sooner than that.  States she has a significant amount of pain, and does not feel she can manage her pain with tylenol and ice packs until Monday 01/30/12.  States she has no change in symptoms; declines new triage.  Requests Rx for pain medication to assist her until seen 01/30/12.  Info to office per patient request for provider review/Rx.  MAY REACH PATIENT AT [phone].

## 2012-01-26 ENCOUNTER — Telehealth: Payer: Self-pay | Admitting: Internal Medicine

## 2012-01-26 MED ORDER — HYDROCODONE-ACETAMINOPHEN 5-325 MG PO TABS: 1.0000 | ORAL_TABLET | Freq: Three times a day (TID) | ORAL | Status: DC | PRN

## 2012-01-26 NOTE — Telephone Encounter (Signed)
Ok for norco refill pending appt - phone in or print/sign ok thanks

## 2012-01-26 NOTE — Telephone Encounter (Signed)
Left message on machine for pt to return my call  

## 2012-01-26 NOTE — Telephone Encounter (Signed)
Left message on machine for pt to return my call regarding Rx. Please advise on MRI, thanks!

## 2012-01-26 NOTE — Telephone Encounter (Signed)
Caller: Lakitha/Patient is calling with a question about Hydroco/Acetaminop 5-325 Mg.The medication was written by Rene Paci. Onset- 2 months per pt.  Afebrile. Pt c/o of back pain. The pain is the same, radiates to her side and down her leg. States she has been referred by Dr. Felicity Coyer to another doctor but they can not see her for f/u until next Monday. Dr. Arta Bruce. Emergent s/s of Back s/s protocol r/o. Pt to see provider within 24hrs. Pt wantts to know if Dr. Felicity Coyer will refill pain medication Hydrocodone until Monday.  Pt would prefer to see Dr. Felicity Coyer if she has to come in and no appts today and she can not come tomorrow am because husband has an appt and no open schedule for provider in the evening. Pt also wants to know if Dr. Felicity Coyer has seen MRI of back.

## 2012-01-27 NOTE — Telephone Encounter (Signed)
Notified pt with md response. Faxing rx to [facility]... 01/27/12@10 :19am/LMB

## 2012-01-27 NOTE — Telephone Encounter (Signed)
Pt has been aware norco was sent to pharmacy (see previous msg) did infom pt md hasn't seen MRI... 01/27/12@11 :58am/LMB

## 2012-01-27 NOTE — Telephone Encounter (Signed)
i have not seen MRI - refill on norco was done 8/8 pending specialist follow up on Mon

## 2012-02-15 IMAGING — CT CT ABD-PELV W/ CM
2 of 5 series · 17 of 46 positions shown, 19 images · IV contrast (Omnipaque 300)
Comparison: Pelvic CT 02/05/2010.  PET CT 06/04/2009.

CLINICAL DATA: Left-sided abdominal pain with nausea and vomiting
for 3 days.  History of right lung cancer and hysterectomy.

CT ABDOMEN AND PELVIS WITH CONTRAST
TECHNIQUE: Multidetector CT imaging of the abdomen and pelvis was
performed following the standard protocol during bolus
administration of intravenous contrast.
Contrast: 100 ml Zmnipaque-622 intravenously.

[Series 2: abd/ pel 5mm · axial · 0.72mm/px · z∈[-464,-98]mm · 14 of 81 slices shown, 16 images]
[im 4/81  soft-tissue]
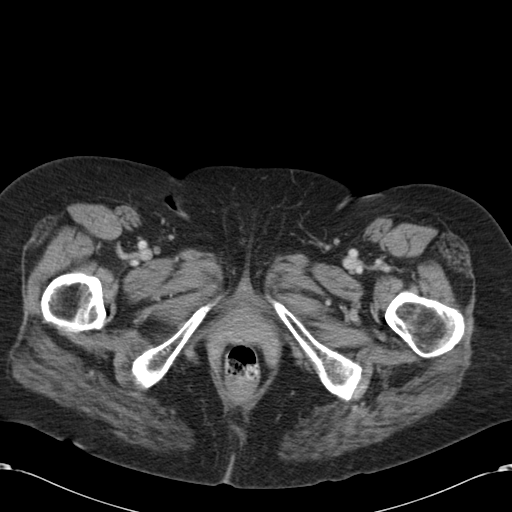
[im 4/81  bone]
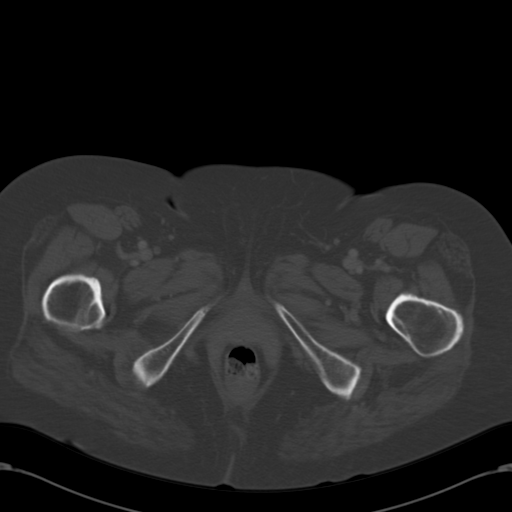
[im 12/81  soft-tissue]
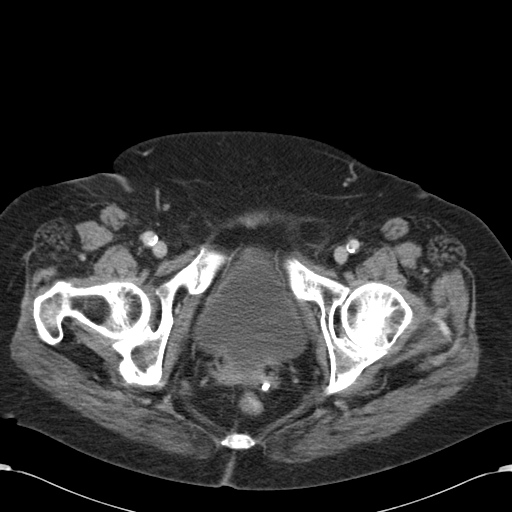
[im 16/81  soft-tissue]
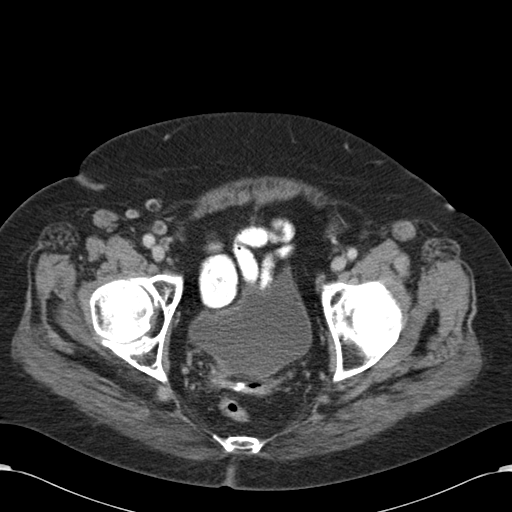
[im 23/81  soft-tissue]
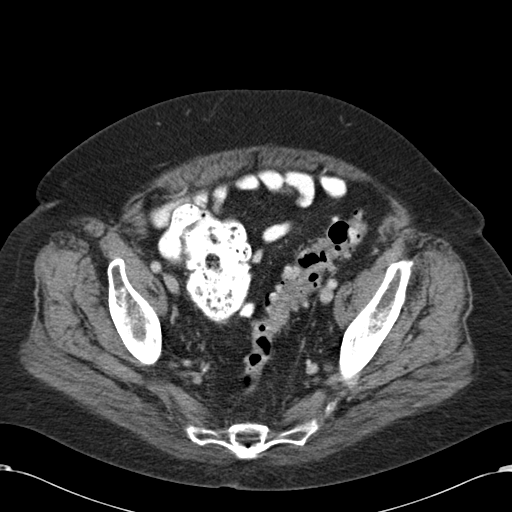
[im 27/81  soft-tissue]
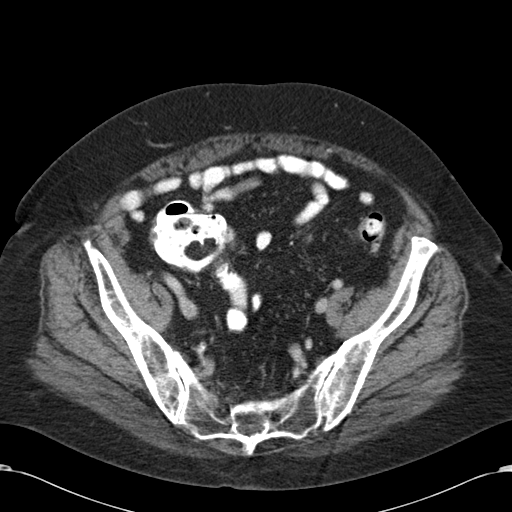
[im 31/81  soft-tissue]
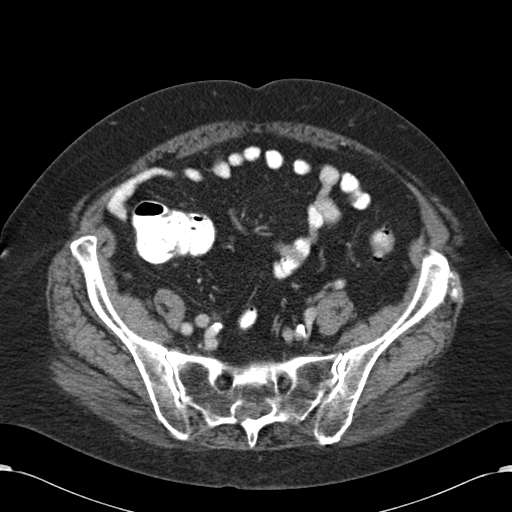
[im 39/81  soft-tissue]
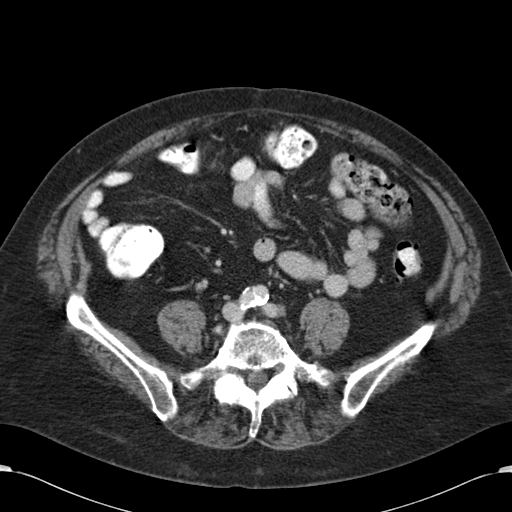
[im 42/81  soft-tissue]
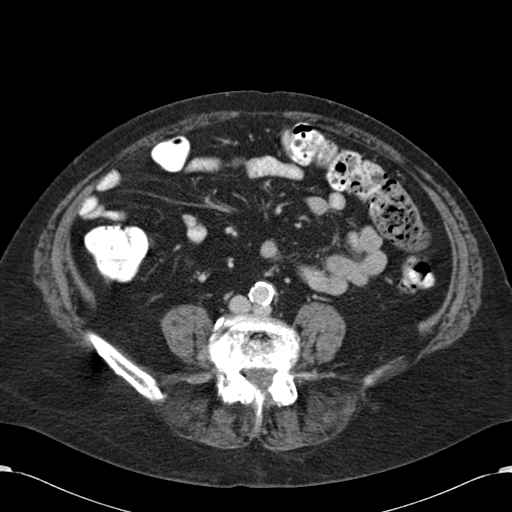
[im 50/81  soft-tissue]
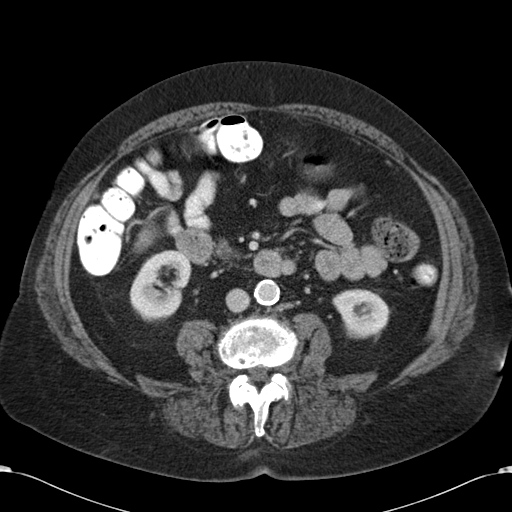
[im 50/81  bone]
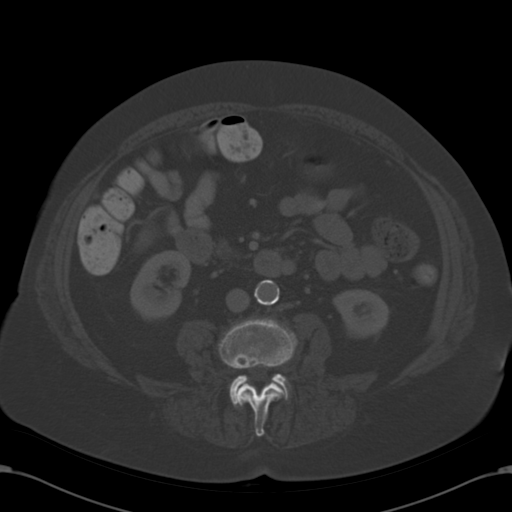
[im 54/81  soft-tissue]
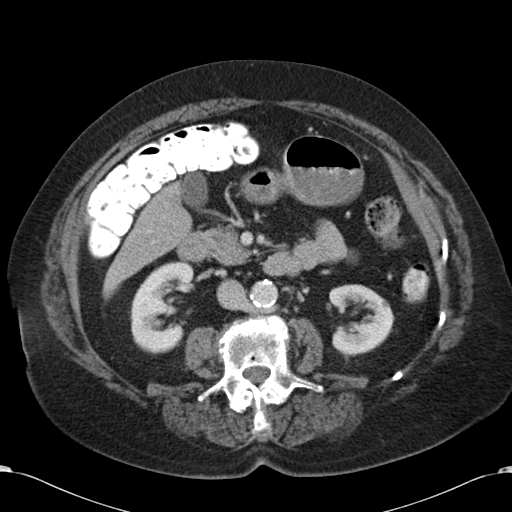
[im 61/81  soft-tissue]
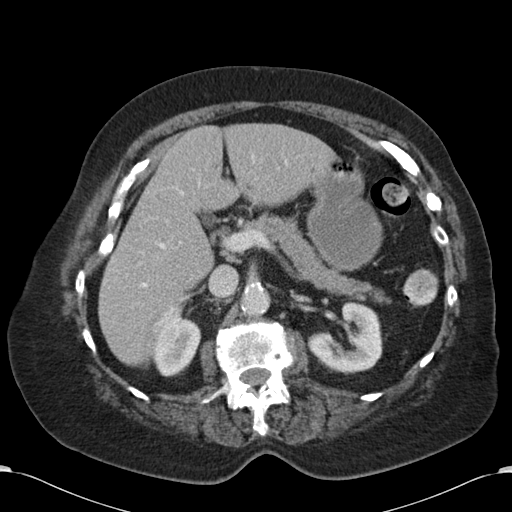
[im 65/81  soft-tissue]
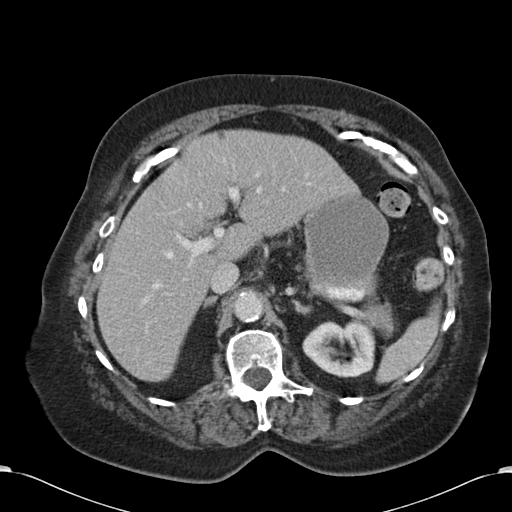
[im 69/81  soft-tissue]
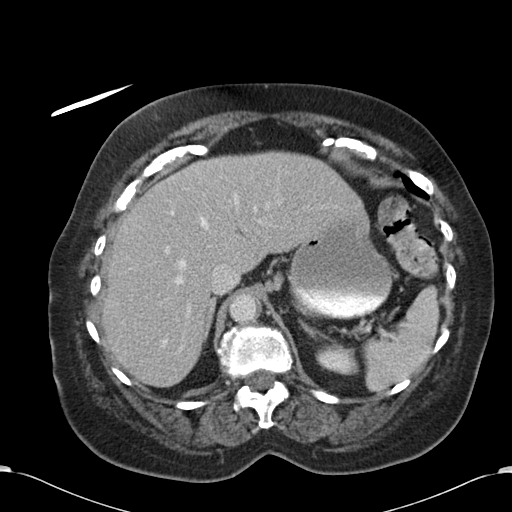
[im 77/81  soft-tissue]
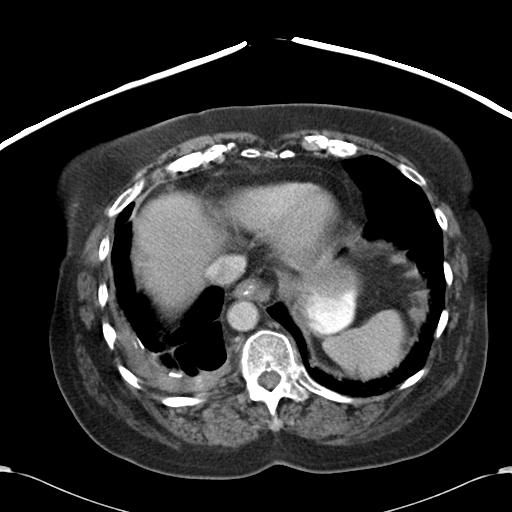

[Series 602: <mpr range> · coronal · 0.82mm/px · 3 of 112 slices shown]
[im 38/112  soft-tissue]
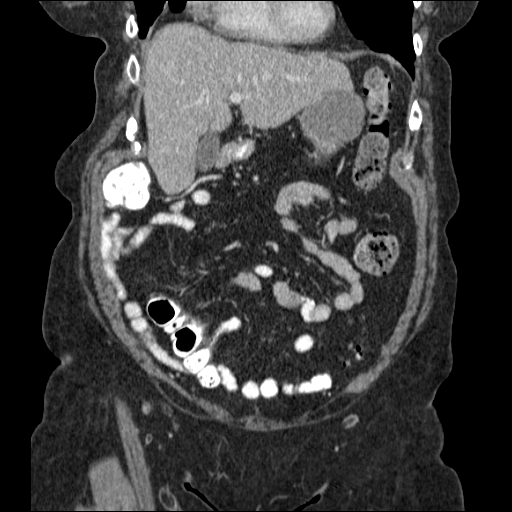
[im 50/112  soft-tissue]
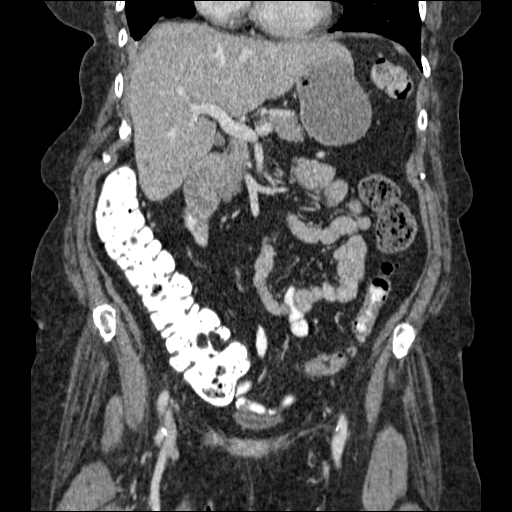
[im 62/112  soft-tissue]
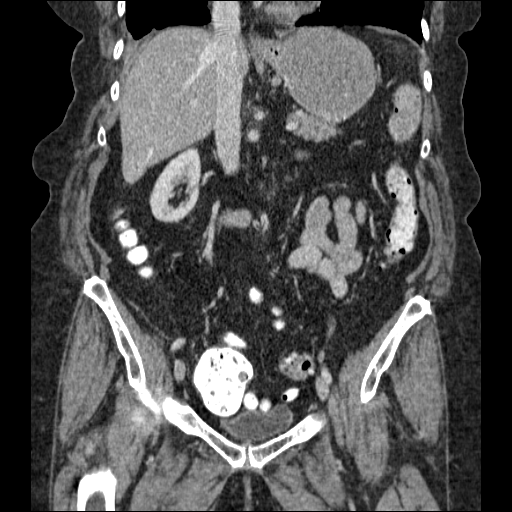

[17 of 46 positions shown; findings below may reference images not displayed]

FINDINGS: There are stable postsurgical changes in the lower right
chest with right pleural thickening.  There is no pleural effusion.

The liver, spleen, gallbladder, pancreas and adrenal glands
demonstrate no suspicious findings.  A small low-density lesion
inferiorly in the right hepatic lobe on image 26 is unchanged.
There are no suspicious renal findings.  There is a tiny low
density left renal lesion on image 28 which is likely a small cyst.
There is no hydronephrosis.

The bowel gas pattern is normal.  There is no evidence of bowel
obstruction.  There are sigmoid colon diverticular changes without
surrounding inflammation.  The appendix is contrast opacified and
extends into the lower pelvis adjacent to the vaginal cuff status
post hysterectomy.  There is no surrounding inflammation.

There is diffuse lumbar spondylosis without evidence of acute
osseous abnormality.  There is diffuse aorto iliac atherosclerosis.
IMPRESSION: 1.  Sigmoid colon diverticulosis without evidence of acute
diverticulitis or surrounding inflammation.
2.  No evidence of bowel obstruction or appendicitis.
3.  Stable postsurgical changes in the lower right hemithorax and
pelvis.

## 2012-02-16 ENCOUNTER — Encounter: Payer: Self-pay | Admitting: Internal Medicine

## 2012-02-16 ENCOUNTER — Ambulatory Visit (INDEPENDENT_AMBULATORY_CARE_PROVIDER_SITE_OTHER): Payer: Medicare Other | Admitting: Internal Medicine

## 2012-02-16 VITALS — BP 124/78 | HR 88 | Ht 65.0 in | Wt 166.6 lb

## 2012-02-16 DIAGNOSIS — J441 Chronic obstructive pulmonary disease with (acute) exacerbation: Secondary | ICD-10-CM

## 2012-02-16 DIAGNOSIS — C3490 Malignant neoplasm of unspecified part of unspecified bronchus or lung: Secondary | ICD-10-CM

## 2012-02-16 DIAGNOSIS — B370 Candidal stomatitis: Secondary | ICD-10-CM

## 2012-02-16 MED ORDER — FLUCONAZOLE 150 MG PO TABS: 150.0000 mg | ORAL_TABLET | Freq: Once | ORAL | Status: AC

## 2012-02-16 NOTE — Progress Notes (Signed)
Patient ID: [name], female    DOB: 1932/07/18, 78 y.o.   MRN: [id number]  HPI 816/12-78 year old female former smoker followed for history of right upper lobe cancer, COPD, allergic rhinitis  Here with husband Last here -August 05, 2010- note reviewed Increased  coughing 2 weeks, but struggling all summer. Better at beach. Here few days ago to see Dr Ronnald Ramp.   Had significantly productive cough- purulent, sometimes  with black spots. Uses nebulizer 2-3x/day, O2 2l/ [employer]. On Avelox now with Symbicort from Dr Ronnald Ramp. Mouth is getting sore. Still actively wheezing and short of breath.  CXR December 07, 2010- clear except old surgical changes.  She is not sure if she began this flare with cold or allergy. Low fever once or twice in past month. CT w/cm- 12/21/10- normal heart size coronary calcification. Prior right upper lobectomy. Stable right millimeter nodule at right minor fissure. No suspicious nodules. Centrilobular emphysema.  05/24/11- 78 year old female former smoker followed for history of right upper lobe cancer, COPD, allergic rhinitis. Here with husband Has had flu shot. She was feeling well until one week ago when she developed sore throat, temperature to 99.7, chills and malaise, cough productive of purulent blood-streaked sputum. Has home nebulizer being used only once daily with Xopenex. She denies swollen glands, fluid retention, GI upset or chest pain.  06/28/11- 78 year old female former smoker followed for history of right upper lobe cancer, COPD, allergic rhinitis. Here with husband Reports- wheezing, chest congestion, runny nose, cough with green/red mucus tinged with blood.  She got well after last visit and been sick again. Did have flu vaccine. Now 3 or 4 days of rhinorrhea, chills, productive cough with discolored sputum. 3 days ago had brief episode of pink in sputum with hard cough and. Cough and sputum production are less today. Occasional tussive soreness  at the xiphoid. Frequent heartburn. Doxycycline and seemed to help but is completed now. Does have home nebulizer.  Had chest x-ray January 7: *RADIOLOGY REPORT* 06/27/11- Clinical Data: 78 year old with hemoptysis.  CHEST - 2 VIEW  Comparison: Chest radiograph 05/24/2011  Findings: Chronic volume loss and postoperative changes in the  right hemithorax. The upper lungs are clear with increased  lucency. Findings suggest emphysematous changes. Stable linear  density at the right lung base is suggestive for scar. The heart  and mediastinum are stable. Slightly increased interstitial  densities in the right mid chest are similar to the prior  examination. No focal airspace disease.  IMPRESSION:  Stable chest radiograph findings. No acute changes.  Original Report Authenticated By: Markus Daft, M.D.   10/13/11- 78 year old female former smoker followed for history of right upper lobe cancer, COPD, allergic rhinitis. Here with husband Always has SOB, wheezing, cough, and congestion-has gotten worse due to pollen-eyes bothering her and runny nose(sneezing) Increased congestion in head and nose. She had had a pneumonia in December and then had fallen at home in January, bruising her face. Using nebulizer twice a day. Complains portable oxygen is too heavy and is thinking about changing to [facility]  02/16/12- 78 year old female former smoker followed for history of right upper lobe NSCCa, COPD, allergic rhinitis. Here with husband Sore throat and tongue-? white spots started 2-3 days ago; last night had cough-productive. Recent cortisone shot by orthopedist. COPD assessment test (CAT) score 13/24 CXR 01/06/12-reviewed with her IMPRESSION:  Stable emphysematous change and chronic postsurgical change of the  right lung without definite acute cardiopulmonary disease.  Original Report Authenticated By:  Waynard Reeds, M.D.   Review of Systems-see HPI Constitutional:   No-   weight loss, night  sweats, , chills, fatigue, lassitude. HEENT:   No-  headaches, difficulty swallowing, tooth/dental problems, +sore throat,     No-  sneezing, itching, ear ache, +nasal congestion, post nasal drip,  CV:  +  chest pain, orthopnea, PND, swelling in lower extremities, anasarca, dizziness, palpitations Resp:    +shortness of breath with exertion or at rest.                 +productive cough,   non-productive cough,    no-coughing up of blood-.              No-   change in color of mucus.    Skin: No-   rash or lesions. GI:  No-   heartburn, indigestion, abdominal pain, nausea, vomiting,  GU:  MS:  +  joint pain or swelling.   Neuro- nothing unusual Psych:  No- change in mood or affect. No depression or anxiety.  No memory loss.     Objective:   Physical Exam General- Alert, Oriented, Affect-appropriate, Distress- none acute, but   ill appearing    Skin- rash-none, lesions- none, excoriation- none Lymphadenopathy- none Head- atraumatic            Eyes- Gross vision intact, PERRLA, conjunctivae clear secretions            Ears- Hearing, canals normal            Nose- Clear, no-Septal dev, mucus, polyps, erosion, perforation             Throat- Mallampati IV , mucosa clear , drainage- none, tonsils- atrophic; mild thrush Neck- flexible , trachea midline, no stridor , thyroid nl, carotid no bruit Chest - symmetrical excursion , unlabored           Heart/CV- RRR , no murmur , no gallop  , no rub, nl s1 s2                           - JVD- none , edema- none, stasis changes- none, varices- none           Lung- + coarse breath sounds, talkative , dullness-none, rub- none           Chest wall-  Abd-  Br/ Gen/ Rectal- Not done, not indicated Extrem- cyanosis- none, clubbing, none, atrophy- none, strength- nl Neuro- grossly intact to observation

## 2012-02-16 NOTE — Patient Instructions (Addendum)
Script sent for diflucan for thrush  Please call as needed

## 2012-02-22 ENCOUNTER — Other Ambulatory Visit (INDEPENDENT_AMBULATORY_CARE_PROVIDER_SITE_OTHER): Payer: Medicare Other

## 2012-02-22 ENCOUNTER — Ambulatory Visit (INDEPENDENT_AMBULATORY_CARE_PROVIDER_SITE_OTHER): Payer: Medicare Other | Admitting: Internal Medicine

## 2012-02-22 ENCOUNTER — Encounter: Payer: Self-pay | Admitting: Internal Medicine

## 2012-02-22 VITALS — BP 124/62 | HR 83 | Temp 97.0°F | Ht 65.0 in | Wt 165.8 lb

## 2012-02-22 DIAGNOSIS — E785 Hyperlipidemia, unspecified: Secondary | ICD-10-CM

## 2012-02-22 DIAGNOSIS — R7309 Other abnormal glucose: Secondary | ICD-10-CM

## 2012-02-22 DIAGNOSIS — R300 Dysuria: Secondary | ICD-10-CM

## 2012-02-22 DIAGNOSIS — J449 Chronic obstructive pulmonary disease, unspecified: Secondary | ICD-10-CM

## 2012-02-22 DIAGNOSIS — E039 Hypothyroidism, unspecified: Secondary | ICD-10-CM

## 2012-02-22 DIAGNOSIS — R269 Unspecified abnormalities of gait and mobility: Secondary | ICD-10-CM

## 2012-02-22 LAB — URINALYSIS, ROUTINE W REFLEX MICROSCOPIC
Hgb urine dipstick: NEGATIVE
Leukocytes, UA: NEGATIVE
Nitrite: NEGATIVE
Specific Gravity, Urine: <=1.005 (ref 1.000–1.030)
Urobilinogen, UA: 0.2 (ref 0.0–1.0)

## 2012-02-22 LAB — LIPID PANEL
Total CHOL/HDL Ratio: 2
Triglycerides: 91 mg/dL (ref 0.0–149.0)

## 2012-02-22 LAB — TSH: TSH: 2.62 u[IU]/mL (ref 0.35–5.50)

## 2012-02-22 LAB — LDL CHOLESTEROL, DIRECT: Direct LDL: 127.6 mg/dL

## 2012-02-22 LAB — HEMOGLOBIN A1C: Hgb A1c MFr Bld: 5.2 % (ref 4.6–6.5)

## 2012-02-22 NOTE — Assessment & Plan Note (Signed)
2LPM [place] O2 qhs and prn - Follows with pulm for same- stable +/- improved  

## 2012-02-22 NOTE — Patient Instructions (Signed)
It was good to see you today. We have reviewed your prior records including labs and tests today Test(s) ordered today. Your results will be called to you after review (48-72hours after test completion). If any changes need to be made, you will be notified at that time. Medications reviewed and updated, no changes at this time. Please schedule followup in 6 months, call sooner if problems.

## 2012-02-22 NOTE — Assessment & Plan Note (Signed)
Prev on Lipitor - stopped due to myalgia - increase lipids prompted resume of med tx - then crestor caused muscle fatigue so decrease dose (protected by high HDL) resumed generic Lipitor 07/2011 10/2011 due to $$ issues

## 2012-02-22 NOTE — Assessment & Plan Note (Signed)
She is due for an A1C test High risk for steroid induced DM Lab Results  Component Value Date   HGBA1C 5.4 01/28/2011

## 2012-02-22 NOTE — Assessment & Plan Note (Signed)
Lab Results  Component Value Date   TSH 0.76 06/29/2011   The current medical regimen is effective;  continue present plan and medications.

## 2012-02-22 NOTE — Progress Notes (Signed)
  Subjective:    Patient ID: [name], female    DOB: Apr 03, 1933, 78 y.o.   MRN: [id number]  HPI  Here for follow up- reviewed chronic medical issues:  COPD - recent flares reviewed, follows with pulm for same - wearing O2 qhs - the patient reports compliance with medication(s) as prescribed. Denies adverse side effects.  Dyslipidemia - on statin - the patient reports compliance with medication(s) as prescribed. Denies adverse side effects. Request change to generic  Hypothyroid - the patient reports compliance with medication(s) as prescribed. Denies adverse side effects.  Gait disorder -accidental fall x 2 06/28/11, 06/10/11 and 01/2012  Frequent orthostatic symptoms with position changes> but intolerant of prior midodrine trial No confusion, vision changes, no incontinence or seizure - no loss of consciousness before or during fall - residual neck spasm and tightness    Past Medical History  Diagnosis Date  . Allergic rhinitis   . COPD (chronic obstructive pulmonary disease)   . Pelvic cyst   . Bladder atony   . Unspecified hypothyroidism   . Hyperlipidemia   . Non-small cell carcinoma of lung, stage 1 2005    1.4 cm Poorly differentiated Squamous cell RUL  T1N0 resected 09/22/2003    Review of Systems  Constitutional: Positive for fatigue. Negative for fever.  Respiratory: Negative for cough and choking.   Cardiovascular: Negative for chest pain and leg swelling.  Musculoskeletal: Negative for joint swelling and gait problem.       Objective:   Physical Exam  BP 124/62  Pulse 83  Temp 97 F (36.1 C) (Oral)  Ht 5\' 5"  (1.651 m)  Wt 165 lb 12.8 oz (75.206 kg)  BMI 27.59 kg/m2  SpO2 95% Wt Readings from Last 3 Encounters:  02/22/12 165 lb 12.8 oz (75.206 kg)  02/16/12 166 lb 9.6 oz (75.569 kg)  01/10/12 170 lb 6.4 oz (77.293 kg)   Constitutional: She appears well-developed and well-nourished. No distress. Spouse at side Neck: Normal range of motion. Neck  supple, but mild spasm. No JVD present. No thyromegaly present.  Cardiovascular: Normal rate, regular rhythm and normal heart sounds.  No murmur heard. No BLE edema. Pulmonary/Chest: Effort normal and breath sounds diminished. No respiratory distress. She has mild insp and exp wheezes.  Neuro: AAOx4, CN2-12,   speech fluent without dysarthria, follows commands and MAE - walks independent Psychiatric: She has a minimally anxious mood and affect. Her behavior is normal. Judgment and thought content normal.   Lab Results  Component Value Date   WBC 6.7 01/06/2012   HGB 12.7 01/06/2012   HCT 38.6 01/06/2012   PLT 221 01/06/2012   CHOL 202* 01/28/2011   TRIG 92.0 01/28/2011   HDL 121.20 01/28/2011   LDLDIRECT 66.9 01/28/2011   ALT 15 01/06/2012   AST 28 01/06/2012   NA 136 01/06/2012   K 4.4 01/06/2012   CL 96 01/06/2012   CREATININE 0.83 01/06/2012   BUN 10 01/06/2012   CO2 31 01/06/2012   TSH 0.76 06/29/2011   INR 0.94 02/05/2010   HGBA1C 5.4 01/28/2011      Assessment & Plan:   See problem list. Medications and labs reviewed today.  Gait disorder - s/p steroid injection L hip with Delbert Harness 01/2012 - improving pain but continued imbalance and falls

## 2012-02-25 NOTE — Assessment & Plan Note (Signed)
Plan-Diflucan. Discussed mouth care while using steroid inhaler.

## 2012-02-25 NOTE — Assessment & Plan Note (Signed)
Mild exacerbation over the last 24 hours Plan-she will continue with her current meds. We discussed warning signs if she needs more help.

## 2012-02-25 NOTE — Assessment & Plan Note (Signed)
No recurrence identified 

## 2012-02-29 ENCOUNTER — Telehealth: Payer: Self-pay | Admitting: Internal Medicine

## 2012-02-29 MED ORDER — LEVOFLOXACIN 750 MG PO TABS: 750.0000 mg | ORAL_TABLET | Freq: Every day | ORAL | Status: AC

## 2012-02-29 NOTE — Telephone Encounter (Signed)
Spoke with pt. She is c/o prod cough with large amounts of green to light yellow sputum x 2 days. She states chest feels sore from cough, but denies CP. She states that her breathing is no worse from usual baseline for her. No f/c/s. She is requesting abx be called in. Has no transportation for appt today. Will forward to doc of the day since CDY out of the office. KC, please advise, thanks! Looks like he has given doxy in the past Allergies  Allergen Reactions  . Daliresp (Roflumilast) Nausea Only  . Tiotropium Bromide Monohydrate     Urinary retention  . Ampicillin     REACTION: GI upset  . Azithromycin     REACTION: diarrhea  . Codeine   . Fludrocortisone Acetate Itching  . Nitrofurantoin     REACTION: Upset GI  . Penicillins   . Sulfonamide Derivatives

## 2012-02-29 NOTE — Telephone Encounter (Signed)
Ok to give levaquin 750mg  one a day for 5 days.

## 2012-02-29 NOTE — Telephone Encounter (Signed)
Called spoke with patient, advised of KC's recs for pt.  Pt verbalized her understanding and denied any questions.  Advised pt to call if her symptoms do not improve or worsen or seek emergency care.  Rx sent to verified pharmacy.

## 2012-03-05 ENCOUNTER — Telehealth: Payer: Self-pay | Admitting: Internal Medicine

## 2012-03-05 MED ORDER — CEFDINIR 300 MG PO CAPS: 300.0000 mg | ORAL_CAPSULE | Freq: Two times a day (BID) | ORAL | Status: DC

## 2012-03-05 NOTE — Telephone Encounter (Signed)
Pt aware, rx sent to [facility].Carron Curie, CMA

## 2012-03-05 NOTE — Telephone Encounter (Signed)
Per CY-okay to give Omnicef 300mg  #14 take 1 po bid no refills.

## 2012-03-05 NOTE — Telephone Encounter (Signed)
I spoke with pt and she stated she has finished Levaquin that was prescribed 02/29/12. She c/o cough w/ yellow-green-sometimes specks of blood in it, chest congestion, nasal congestion, facial pressure, nausea, loss of appetite, occasional low grade fever. She has tried OTC medication w/o relief. This has been going on over a week now. She is requesting to have something else called in for her. Please advise Dr. Maple Hudson thanks  Allergies  Allergen Reactions  . Daliresp (Roflumilast) Nausea Only  . Tiotropium Bromide Monohydrate     Urinary retention  . Ampicillin     REACTION: GI upset  . Azithromycin     REACTION: diarrhea  . Codeine   . Fludrocortisone Acetate Itching  . Nitrofurantoin     REACTION: Upset GI  . Penicillins   . Sulfonamide Derivatives

## 2012-03-30 ENCOUNTER — Other Ambulatory Visit: Payer: Self-pay | Admitting: Internal Medicine

## 2012-04-02 ENCOUNTER — Other Ambulatory Visit: Payer: Self-pay | Admitting: Internal Medicine

## 2012-04-02 NOTE — Telephone Encounter (Signed)
Too soon, Just had rx done oct 11

## 2012-04-04 ENCOUNTER — Encounter: Payer: Self-pay | Admitting: Internal Medicine

## 2012-04-04 ENCOUNTER — Ambulatory Visit (INDEPENDENT_AMBULATORY_CARE_PROVIDER_SITE_OTHER): Payer: Medicare Other | Admitting: Internal Medicine

## 2012-04-04 ENCOUNTER — Other Ambulatory Visit: Payer: Self-pay | Admitting: Internal Medicine

## 2012-04-04 VITALS — BP 132/80 | HR 89 | Temp 98.7°F | Ht 67.0 in | Wt 164.5 lb

## 2012-04-04 DIAGNOSIS — K5732 Diverticulitis of large intestine without perforation or abscess without bleeding: Secondary | ICD-10-CM

## 2012-04-04 DIAGNOSIS — K5792 Diverticulitis of intestine, part unspecified, without perforation or abscess without bleeding: Secondary | ICD-10-CM

## 2012-04-04 MED ORDER — METRONIDAZOLE 500 MG PO TABS: 500.0000 mg | ORAL_TABLET | Freq: Four times a day (QID) | ORAL | Status: DC

## 2012-04-04 MED ORDER — PROMETHAZINE HCL 25 MG PO TABS: 25.0000 mg | ORAL_TABLET | Freq: Three times a day (TID) | ORAL | Status: DC | PRN

## 2012-04-04 MED ORDER — CIPROFLOXACIN HCL 500 MG PO TABS: 500.0000 mg | ORAL_TABLET | Freq: Two times a day (BID) | ORAL | Status: DC

## 2012-04-04 MED ORDER — ALPRAZOLAM 1 MG PO TABS: 1.0000 mg | ORAL_TABLET | Freq: Every evening | ORAL | Status: DC | PRN

## 2012-04-04 NOTE — Patient Instructions (Addendum)
Diverticulitis - patient has diverticulosis by BaE Dec '07. Symtoms and physical exam are c/w acute diverticulitis. She is hemodynamically stable and thinks she can take oral medication  Plan Antibiotics: cipro 500 mg twice a day; flagyl 500 mg 4 times a day  Phenergan 12.5 mg every 6 hours for nausea  Clear liquid diet until symptoms are better  If you cannot keep down the antibiotics you will need to be hospitalized for IV antibiotics.  Diverticulitis A diverticulum is a small pouch or sac on the colon. Diverticulosis is the presence of these diverticula on the colon. Diverticulitis is the irritation (inflammation) or infection of diverticula. CAUSES   The colon and its diverticula contain bacteria. If food particles block the tiny opening to a diverticulum, the bacteria inside can grow and cause an increase in pressure. This leads to infection and inflammation and is called diverticulitis. SYMPTOMS    Abdominal pain and tenderness. Usually, the pain is located on the left side of your abdomen. However, it could be located elsewhere.   Fever.   Bloating.   Feeling sick to your stomach (nausea).   Throwing up (vomiting).   Abnormal stools.  DIAGNOSIS   Your caregiver will take a history and perform a physical exam. Since many things can cause abdominal pain, other tests may be necessary. Tests may include:  Blood tests.   Urine tests.   X-ray of the abdomen.   CT scan of the abdomen.  Sometimes, surgery is needed to determine if diverticulitis or other conditions are causing your symptoms. TREATMENT   Most of the time, you can be treated without surgery. Treatment includes:  Resting the bowels by only having liquids for a few days. As you improve, you will need to eat a low-fiber diet.   Intravenous (IV) fluids if you are losing body fluids (dehydrated).   Antibiotic medicines that treat infections may be given.   Pain and nausea medicine, if needed.   Surgery if the  inflamed diverticulum has burst.  HOME CARE INSTRUCTIONS    Try a clear liquid diet (broth, tea, or water for as long as directed by your caregiver). You may then gradually begin a low-fiber diet as tolerated. A low-fiber diet is a diet with less than 10 grams of fiber. Choose the foods below to reduce fiber in the diet:   White breads, cereals, rice, and pasta.   Cooked fruits and vegetables or soft fresh fruits and vegetables without the skin.   Ground or well-cooked tender beef, ham, veal, lamb, pork, or poultry.   Eggs and seafood.   After your diverticulitis symptoms have improved, your caregiver may put you on a high-fiber diet. A high-fiber diet includes 14 grams of fiber for every 1000 calories consumed. For a standard 2000 calorie diet, you would need 28 grams of fiber. Follow these diet guidelines to help you increase the fiber in your diet. It is important to slowly increase the amount fiber in your diet to avoid gas, constipation, and bloating.   Choose whole-grain breads, cereals, pasta, and brown rice.   Choose fresh fruits and vegetables with the skin on. Do not overcook vegetables because the more vegetables are cooked, the more fiber is lost.   Choose more nuts, seeds, legumes, dried peas, beans, and lentils.   Look for food products that have greater than 3 grams of fiber per serving on the Nutrition Facts label.   Take all medicine as directed by your caregiver.   If your caregiver  has given you a follow-up appointment, it is very important that you go. Not going could result in lasting (chronic) or permanent injury, pain, and disability. If there is any problem keeping the appointment, call to reschedule.  SEEK MEDICAL CARE IF:    Your pain does not improve.   You have a hard time advancing your diet beyond clear liquids.   Your bowel movements do not return to normal.  SEEK IMMEDIATE MEDICAL CARE IF:    Your pain becomes worse.   You have an oral temperature  above 102 F (38.9 C), not controlled by medicine.   You have repeated vomiting.   You have bloody or black, tarry stools.   Symptoms that brought you to your caregiver become worse or are not getting better.  MAKE SURE YOU:    Understand these instructions.   Will watch your condition.   Will get help right away if you are not doing well or get worse.  Document Released: 03/16/2005 Document Revised: 08/29/2011 Document Reviewed: 07/12/2010 [facility] Patient Information 2013 [place], [place].

## 2012-04-04 NOTE — Progress Notes (Signed)
Subjective:    Patient ID: [name], female    DOB: 1933-06-01, 78 y.o.   MRN: [id number]  HPI [name] presents for abdominal pain with N/V starting this past Sunday. She also had 2 days of diarrhea - frequent loose stools. Initially there was mucus in the stool but at she started Pepto Bismal the stools became dark and she couldn't tell if there was persistent mucus.   She was unable to sleep for the last two nights. She usually takes xanax at bedtime but has been out of medication since Friday. She has been dizzy.   The diarrhea has abated but she is still nauseated. She has been taking in copious fluid but has not been able to eat. She does have some mild dysuria. No fever or chills. She has been coughing a lot productive of a white frothy sputum. She does have chronic lung disease and is on multiple medications.   Past Medical History  Diagnosis Date  . Allergic rhinitis   . COPD (chronic obstructive pulmonary disease)   . Pelvic cyst   . Bladder atony   . Unspecified hypothyroidism   . Hyperlipidemia   . Non-small cell carcinoma of lung, stage 1 2005    1.4 cm Poorly differentiated Squamous cell RUL  T1N0 resected 09/22/2003   Past Surgical History  Procedure Date  . Lung lobectomy 2005    Right Upper Dr Edwyna Shell  . Abdominal hysterectomy   . Spine surgery   . Needle aspiration pelvic cyst 2011    Dr Retta Diones  . Cataract extraction    Family History  Problem Relation Age of Onset  . Heart disease Father   . Rheumatologic disease Sister   . Rheumatologic disease Brother   . Cancer Brother     Leukemia   History   Social History  . Marital Status: Married    Spouse Name: N/A    Number of Children: N/A  . Years of Education: N/A   Occupational History  . Retired Chemical engineer, School system    Social History Main Topics  . Smoking status: Former Games developer  . Smokeless tobacco: Not on file   Comment: Quit 2001, Smoked x 42 yrs - 1 PPD  . Alcohol Use:  No  . Drug Use: No  . Sexually Active: Not Currently   Other Topics Concern  . Not on file   Social History Narrative  . No narrative on file    Current Outpatient Prescriptions on File Prior to Visit  Medication Sig Dispense Refill  . ALPRAZolam (XANAX) 0.5 MG tablet TAKE TWO TABLETS BY MOUTH AT BEDTIME AS NEEDED FOR ANXIETY  AND FOR SLEEP  30 tablet  5  . ALPRAZolam (XANAX) 1 MG tablet TAKE ONE TABLET BY MOUTH AT BEDTIME AS NEEDED FOR ANXIETY FOR SLEEP  15 tablet  4  . aspirin 81 MG EC tablet Take 81 mg by mouth daily.        [name] atorvastatin (LIPITOR) 40 MG tablet Take 1 tablet (40 mg total) by mouth daily.  90 tablet  3  . beclomethasone (QVAR) 80 MCG/ACT inhaler Inhale 2 puffs into the lungs 2 (two) times daily.  1 Inhaler  11  . cefdinir (OMNICEF) 300 MG capsule Take 1 capsule (300 mg total) by mouth 2 (two) times daily.  14 capsule  0  . levalbuterol (XOPENEX HFA) 45 MCG/ACT inhaler Inhale 2 puffs into the lungs 4 (four) times daily as needed. As rescue inhaler       .  levalbuterol (XOPENEX) 1.25 MG/3ML nebulizer solution Use 1 vial in nebulizer 3 times daily and as needed.  DX:  496 FILE MCR PART B  360 mL  3  . levothyroxine (SYNTHROID, LEVOTHROID) 100 MCG tablet TAKE ONE TABLET BY MOUTH EVERY DAY  30 tablet  5  . Multiple Vitamins-Minerals (CENTRUM SILVER PO) Take by mouth daily.        . promethazine (PHENERGAN) 25 MG tablet Take 1-2 tablets (25-50 mg total) by mouth 4 (four) times daily as needed for nausea.  30 tablet  0  . benzonatate (TESSALON) 100 MG capsule Take 1-2 capsules (100-200 mg total) by mouth 4 (four) times daily as needed. For cough  20 capsule  2      Review of Systems Constitutional:  Negative for fever, chills, activity change and unexpected weight change.  HEENT:  Negative for hearing loss, ear pain, congestion, neck stiffness and postnasal drip. Negative for sore throat or swallowing problems. Negative for dental complaints.   Eyes: Negative for vision  loss or change in visual acuity.  Respiratory: Negative for chest tightness and wheezing. Negative for DOE.   Cardiovascular: Negative for chest pain or palpitations. No decreased exercise tolerance Gastrointestinal: No change in bowel habit. No bloating or gas. No reflux or indigestion Genitourinary: Negative for urgency, frequency, flank pain and difficulty urinating.  Musculoskeletal: Negative for myalgias, back pain, arthralgias and gait problem.  Neurological: Negative for dizziness, tremors, weakness and headaches.  Hematological: Negative for adenopathy.  Psychiatric/Behavioral: Negative for behavioral problems and dysphoric mood.       Objective:   Physical Exam Filed Vitals:   04/04/12 1705  BP: 132/80  Pulse: 89  Temp: 98.7 F (37.1 C)   Gen'l - ill appearing white woman with pallor but not in acute distress HEENT- C&S clear Cor - RRR Pulm - wheezing more at the right base, prolonged expiratory phase, using abdominal muscles to assist  Abdomen - hypoactive BS, no guarding or rebound, tender to deep palpation at the LLQ. Neuro - alert and oriented.        Assessment & Plan:  Diverticulitis - patient has diverticulosis by BaE Dec '07. Symtoms and physical exam are c/w acute diverticulitis. She is hemodynamically stable and thinks she can take oral medication  Plan Antibiotics: cipro 500 mg twice a day; flagyl 500 mg 4 times a day  Phenergan 12.5 mg every 6 hours for nausea  Clear liquid diet until symptoms are better  If you cannot keep down the antibiotics you will need to be hospitalized for IV antibiotics.

## 2012-04-06 ENCOUNTER — Observation Stay (HOSPITAL_COMMUNITY)
Admission: EM | Admit: 2012-04-06 | Discharge: 2012-04-07 | Disposition: A | Discharge status: Home / Self Care | Payer: Medicare Other | Attending: Emergency Medicine | Admitting: Emergency Medicine

## 2012-04-06 ENCOUNTER — Telehealth: Payer: Self-pay | Admitting: Internal Medicine

## 2012-04-06 ENCOUNTER — Emergency Department (HOSPITAL_COMMUNITY): Payer: Medicare Other

## 2012-04-06 ENCOUNTER — Encounter (HOSPITAL_COMMUNITY): Payer: Self-pay | Admitting: Cardiology

## 2012-04-06 DIAGNOSIS — E860 Dehydration: Secondary | ICD-10-CM | POA: Insufficient documentation

## 2012-04-06 DIAGNOSIS — R630 Anorexia: Secondary | ICD-10-CM | POA: Insufficient documentation

## 2012-04-06 DIAGNOSIS — E785 Hyperlipidemia, unspecified: Secondary | ICD-10-CM | POA: Insufficient documentation

## 2012-04-06 DIAGNOSIS — E039 Hypothyroidism, unspecified: Secondary | ICD-10-CM | POA: Insufficient documentation

## 2012-04-06 DIAGNOSIS — R197 Diarrhea, unspecified: Secondary | ICD-10-CM | POA: Insufficient documentation

## 2012-04-06 DIAGNOSIS — J4489 Other specified chronic obstructive pulmonary disease: Secondary | ICD-10-CM | POA: Insufficient documentation

## 2012-04-06 DIAGNOSIS — J449 Chronic obstructive pulmonary disease, unspecified: Secondary | ICD-10-CM | POA: Insufficient documentation

## 2012-04-06 DIAGNOSIS — R109 Unspecified abdominal pain: Secondary | ICD-10-CM | POA: Insufficient documentation

## 2012-04-06 DIAGNOSIS — R112 Nausea with vomiting, unspecified: Principal | ICD-10-CM | POA: Insufficient documentation

## 2012-04-06 LAB — URINALYSIS, ROUTINE W REFLEX MICROSCOPIC
Bilirubin Urine: NEGATIVE
Glucose, UA: NEGATIVE mg/dL
Hgb urine dipstick: NEGATIVE
Specific Gravity, Urine: 1.005 (ref 1.005–1.030)

## 2012-04-06 LAB — CBC WITH DIFFERENTIAL/PLATELET
Basophils Absolute: 0 10*3/uL (ref 0.0–0.1)
Basophils Relative: 0 % (ref 0–1)
HCT: 37.9 % (ref 36.0–46.0)
Lymphocytes Relative: 15 % (ref 12–46)
MCHC: 33.5 g/dL (ref 30.0–36.0)
Monocytes Absolute: 0.8 10*3/uL (ref 0.1–1.0)
Neutro Abs: 4.1 10*3/uL (ref 1.7–7.7)
Neutrophils Relative %: 71 % (ref 43–77)
Platelets: 207 10*3/uL (ref 150–400)
RDW: 14 % (ref 11.5–15.5)
WBC: 5.7 10*3/uL (ref 4.0–10.5)

## 2012-04-06 LAB — COMPREHENSIVE METABOLIC PANEL
ALT: 18 U/L (ref 0–35)
AST: 31 U/L (ref 0–37)
Albumin: 3.6 g/dL (ref 3.5–5.2)
Calcium: 9.6 mg/dL (ref 8.4–10.5)
Chloride: 91 mEq/L — ABNORMAL LOW (ref 96–112)
Creatinine, Ser: 0.72 mg/dL (ref 0.50–1.10)
Sodium: 130 mEq/L — ABNORMAL LOW (ref 135–145)
Total Bilirubin: 0.7 mg/dL (ref 0.3–1.2)

## 2012-04-06 LAB — OCCULT BLOOD, POC DEVICE: Fecal Occult Bld: NEGATIVE

## 2012-04-06 LAB — URINE MICROSCOPIC-ADD ON

## 2012-04-06 MED ORDER — ONDANSETRON HCL 4 MG/2ML IJ SOLN: 4.0000 mg | Freq: Four times a day (QID) | INTRAMUSCULAR | Status: DC | PRN

## 2012-04-06 MED ORDER — SODIUM CHLORIDE 0.9 % IV SOLN
1000.0000 mL | INTRAVENOUS | Status: DC
  Administered 2012-04-06: 1000 mL via INTRAVENOUS

## 2012-04-06 MED ORDER — IOHEXOL 300 MG/ML  SOLN
20.0000 mL | INTRAMUSCULAR | Status: AC
  Administered 2012-04-06 (×2): 20 mL via ORAL

## 2012-04-06 MED ORDER — ONDANSETRON HCL 4 MG/2ML IJ SOLN
4.0000 mg | Freq: Once | INTRAMUSCULAR | Status: AC
  Administered 2012-04-06: 4 mg via INTRAVENOUS
  Filled 2012-04-06: qty 2

## 2012-04-06 MED ORDER — DIPHENOXYLATE-ATROPINE 2.5-0.025 MG PO TABS
1.0000 | ORAL_TABLET | Freq: Once | ORAL | Status: AC
  Administered 2012-04-06: 1 via ORAL
  Filled 2012-04-06 (×2): qty 1

## 2012-04-06 MED ORDER — CIPROFLOXACIN IN D5W 400 MG/200ML IV SOLN
400.0000 mg | Freq: Once | INTRAVENOUS | Status: AC
  Administered 2012-04-06: 400 mg via INTRAVENOUS
  Filled 2012-04-06: qty 200

## 2012-04-06 MED ORDER — METOPROLOL TARTRATE 1 MG/ML IV SOLN: 5.0000 mg | Freq: Once | INTRAVENOUS | Status: DC

## 2012-04-06 MED ORDER — ACETAMINOPHEN 325 MG PO TABS
650.0000 mg | ORAL_TABLET | ORAL | Status: DC | PRN
  Administered 2012-04-06 – 2012-04-07 (×2): 650 mg via ORAL
  Filled 2012-04-06 (×2): qty 2

## 2012-04-06 MED ORDER — SODIUM CHLORIDE 0.9 % IV BOLUS (SEPSIS)
1000.0000 mL | Freq: Once | INTRAVENOUS | Status: AC
  Administered 2012-04-06: 1000 mL via INTRAVENOUS

## 2012-04-06 MED ORDER — SODIUM CHLORIDE 0.9 % IV SOLN
1000.0000 mL | Freq: Once | INTRAVENOUS | Status: AC
  Administered 2012-04-06: 1000 mL via INTRAVENOUS

## 2012-04-06 MED ORDER — METRONIDAZOLE IN NACL 5-0.79 MG/ML-% IV SOLN
500.0000 mg | Freq: Once | INTRAVENOUS | Status: AC
  Administered 2012-04-06: 500 mg via INTRAVENOUS
  Filled 2012-04-06: qty 100

## 2012-04-06 MED ORDER — ZOLPIDEM TARTRATE 5 MG PO TABS: 2.5000 mg | ORAL_TABLET | Freq: Every evening | ORAL | Status: DC | PRN

## 2012-04-06 MED ORDER — IOHEXOL 300 MG/ML  SOLN
100.0000 mL | Freq: Once | INTRAMUSCULAR | Status: AC | PRN
  Administered 2012-04-06: 100 mL via INTRAVENOUS

## 2012-04-06 NOTE — ED Provider Notes (Signed)
Pt recently diagnosed with diverticulitis currently on cipro/flagyl. However, unable to tolerates meds, therefore being placed in CDU under dehydration protocol.  Pt reports she is having persistent diarrhea for the past 2 days.  Continues to endorse LLQ abd pain.  Has nausea and vomits but that has improved.    On examination she appeared in good health and spirits. Vital signs as documented. Skin warm and dry and without overt rashes. Oral mucosa moist. Neck without JVD. Lungs clear. Heart exam notable for regular rhythm, normal sounds and absence of murmurs, rubs or gallops. Abdomen unremarkable and without evidence of organomegaly, masses, or abdominal aortic enlargement. Tenderness to left lower quadrant without guarding or rebound. Extremities nonedematous.  BP 184/84  Pulse 90  Temp 98.3 F (36.8 C) (Oral)  Resp 16  SpO2 100%  Patient request for pain medication for her headache, tylenol ordered.   12:30 AM Report given to Dr. Lavella Lemons, who will continue dehydration protocol and d/c pt in the AM.  Will order PO trial tonight.  Pt otherwise has improvement of her vomiting although does endorse mild nausea.  Pt reports her diarrhea has also improved.    BP 115/97  Pulse 87  Temp 97.7 F (36.5 C) (Oral)  Resp 20  SpO2 100%  Nursing notes reviewed and considered in documentation  Previous records reviewed and considered  All labs/vitals reviewed and considered  xrays reviewed and considered  Results for orders placed during the hospital encounter of 04/06/12  CBC WITH DIFFERENTIAL      Component Value Range   WBC 5.7  4.0 - 10.5 K/uL   RBC 3.92  3.87 - 5.11 MIL/uL   Hemoglobin 12.7  12.0 - 15.0 g/dL   HCT 16.1  09.6 - 04.5 %   MCV 96.7  78.0 - 100.0 fL   MCH 32.4  26.0 - 34.0 pg   MCHC 33.5  30.0 - 36.0 g/dL   RDW 40.9  81.1 - 91.4 %   Platelets 207  150 - 400 K/uL   Neutrophils Relative 71  43 - 77 %   Neutro Abs 4.1  1.7 - 7.7 K/uL   Lymphocytes Relative 15  12 - 46 %   Lymphs Abs 0.9  0.7 - 4.0 K/uL   Monocytes Relative 14 (*) 3 - 12 %   Monocytes Absolute 0.8  0.1 - 1.0 K/uL   Eosinophils Relative 0  0 - 5 %   Eosinophils Absolute 0.0  0.0 - 0.7 K/uL   Basophils Relative 0  0 - 1 %   Basophils Absolute 0.0  0.0 - 0.1 K/uL  COMPREHENSIVE METABOLIC PANEL      Component Value Range   Sodium 130 (*) 135 - 145 mEq/L   Potassium 3.5  3.5 - 5.1 mEq/L   Chloride 91 (*) 96 - 112 mEq/L   CO2 29  19 - 32 mEq/L   Glucose, Bld 115 (*) 70 - 99 mg/dL   BUN 4 (*) 6 - 23 mg/dL   Creatinine, Ser 7.82  0.50 - 1.10 mg/dL   Calcium 9.6  8.4 - 95.6 mg/dL   Total Protein 7.4  6.0 - 8.3 g/dL   Albumin 3.6  3.5 - 5.2 g/dL   AST 31  0 - 37 U/L   ALT 18  0 - 35 U/L   Alkaline Phosphatase 92  39 - 117 U/L   Total Bilirubin 0.7  0.3 - 1.2 mg/dL   GFR calc non Af Amer 81 (*) >90  mL/min   GFR calc Af Amer >90  >90 mL/min  URINALYSIS, ROUTINE W REFLEX MICROSCOPIC      Component Value Range   Color, Urine YELLOW  YELLOW   APPearance CLEAR  CLEAR   Specific Gravity, Urine 1.005  1.005 - 1.030   pH 7.0  5.0 - 8.0   Glucose, UA NEGATIVE  NEGATIVE mg/dL   Hgb urine dipstick NEGATIVE  NEGATIVE   Bilirubin Urine NEGATIVE  NEGATIVE   Ketones, ur NEGATIVE  NEGATIVE mg/dL   Protein, ur NEGATIVE  NEGATIVE mg/dL   Urobilinogen, UA 0.2  0.0 - 1.0 mg/dL   Nitrite NEGATIVE  NEGATIVE   Leukocytes, UA SMALL (*) NEGATIVE  LACTIC ACID, PLASMA      Component Value Range   Lactic Acid, Venous 1.0  0.5 - 2.2 mmol/L  LIPASE, BLOOD      Component Value Range   Lipase 22  11 - 59 U/L  OCCULT BLOOD, POC DEVICE      Component Value Range   Fecal Occult Bld NEGATIVE    URINE MICROSCOPIC-ADD ON      Component Value Range   Squamous Epithelial / LPF RARE  RARE   WBC, UA 0-2  <3 WBC/hpf   Bacteria, UA RARE  RARE   Ct Abdomen Pelvis W Contrast  04/06/2012  *RADIOLOGY REPORT*  Clinical Data: Nausea, emesis.  CT ABDOMEN AND PELVIS WITH CONTRAST  Technique:  Multidetector CT imaging of the  abdomen and pelvis was performed following the standard protocol during bolus administration of intravenous contrast.  Contrast: OMNIPAQUE IOHEXOL 300 MG/ML  SOLN  Comparison: 12/21/2011  Findings: Calcified pleural plaque on the right. Right posterior complex fluid versus pleural thickening, similar to prior. Emphysematous changes.  Heart size within normal limits.  Small hiatal hernia.  There are a couple hypodensities within the liver, nonspecific however similar to prior.  Unremarkable spleen, biliary system, pancreas, adrenal glands.  Symmetric renal enhancement.  No hydronephrosis or hydroureter  Colonic diverticulosis.  Normal appendix.  No bowel obstruction. No free intraperitoneal air or fluid.  No lymphadenopathy.  There is scattered atherosclerotic calcification of the aorta and its branches. No aneurysmal dilatation.  Retroaortic left renal vein.  Thin-walled bladder.  Absent uterus.  No adnexal mass.  Multilevel degenerative changes of the imaged spine. No acute or aggressive appearing osseous lesion.  IMPRESSION: Chronic pleural thickening or complex fluid along the periphery the right hemithorax is similar to prior.  Emphysema.  Small hiatal hernia.  Colonic diverticulosis.  No CT evidence for diverticulitis.  No bowel obstruction.  Advanced atherosclerotic disease.   Original Report Authenticated By: Waneta Martins, M.D.       Fayrene Helper, PA-C 04/07/12 [phone]

## 2012-04-06 NOTE — Telephone Encounter (Signed)
Caller: [name]/Patient, PCP Felicity Coyer,  Patient states she was in the office on Wednesday for abdominal pain and n/v/d.  She had been sick since Monday 04/02/12.  She was diagnosed with Diverticulitis.  She has been taking the medication as directed. She continues to vomit despite Cipro and phenergan.  "Nothing helps".  She has not slept but a few hours since Monday.  Diarrhea every hour , loose stools, stools are black. "black Water". Constant vomiting with intake and phenergan is not helping.   Yesterday, lightheaded and dizzy, blurred vision and weak. Voice is clear  She states that Dr. Debby Bud told her if she was not better in a few days she would have to go to the ER.  Reviewed Epic and Dr. Debby Bud instructions. Antibiotics: cipro 500 mg twice a day; flagyl 500 mg 4 times a day, Phenergan 12.5 mg every 6 hours for nausea, Clear liquid diet until symptoms are better, If you cannot keep down the antibiotics you will need to be hospitalized for IV antibiotics".  Medication, history reviewed per Epic . Advised patient to go to ER as directed. Emergent s/sx ruled out per Gastrointestinal Bleeding Protocol withe the excepiton to "Passing red, black or tarry material from rectum and new s/sx of hypovolemia. ER/911.  Patient states husband will take her. Care Advice and call back parameters reviewed with patient. Understanding expressed.

## 2012-04-06 NOTE — ED Notes (Signed)
Diarrhea diaper cleaned .

## 2012-04-06 NOTE — ED Provider Notes (Signed)
History     CSN: [id number]  Arrival date & time 04/06/12  1031   First MD Initiated Contact with Patient 04/06/12 1058      Chief Complaint  Patient presents with  . Dehydration  . Nausea  . Emesis    (Consider location/radiation/quality/duration/timing/severity/associated sxs/prior treatment) HPI Comments: Patient present with left lower quadrant pain and she's had for the past 4 days associated with nausea, vomiting diarrhea. She saw her PCP 3 days ago and diagnosed with diverticulitis and put on antibiotics. Her abdominal pain, nausea vomiting diarrhea continued. She denies any fevers. She endorses black tarry stools are watery. She has multiple episodes of emesis and is unable to tolerate clears. No previous abdominal. No history of diverticulitis. No chest pain or shortness of breath. No urinary symptoms.  The history is provided by the patient.    Past Medical History  Diagnosis Date  . Allergic rhinitis   . COPD (chronic obstructive pulmonary disease)   . Pelvic cyst   . Bladder atony   . Unspecified hypothyroidism   . Hyperlipidemia   . Non-small cell carcinoma of lung, stage 1 2005    1.4 cm Poorly differentiated Squamous cell RUL  T1N0 resected 09/22/2003    Past Surgical History  Procedure Date  . Lung lobectomy 2005    Right Upper Dr Edwyna Shell  . Abdominal hysterectomy   . Spine surgery   . Needle aspiration pelvic cyst 2011    Dr Retta Diones  . Cataract extraction     Family History  Problem Relation Age of Onset  . Heart disease Father   . Rheumatologic disease Sister   . Rheumatologic disease Brother   . Cancer Brother     Leukemia    History  Substance Use Topics  . Smoking status: Former Games developer  . Smokeless tobacco: Not on file   Comment: Quit 2001, Smoked x 42 yrs - 1 PPD  . Alcohol Use: No    OB History    Grav Para Term Preterm Abortions TAB SAB Ect Mult Living                  Review of Systems  Constitutional: Positive for activity  change and appetite change. Negative for fever.  HENT: Negative for congestion and rhinorrhea.   Respiratory: Negative for chest tightness.   Cardiovascular: Negative for chest pain.  Gastrointestinal: Positive for nausea, vomiting, abdominal pain and diarrhea.  Genitourinary: Negative for dysuria.  Musculoskeletal: Negative for back pain.  Skin: Negative for rash.  Neurological: Negative for dizziness and headaches.    Allergies  Daliresp; Tiotropium bromide monohydrate; Ampicillin; Azithromycin; Codeine; Fludrocortisone acetate; Nitrofurantoin; Penicillins; and Sulfonamide derivatives  Home Medications   Current Outpatient Rx  Name Route Sig Dispense Refill  . ALPRAZOLAM 1 MG PO TABS Oral Take 1 tablet (1 mg total) by mouth at bedtime as needed for sleep. 15 tablet 4    Rx sent 01/03/2012 #30 with 5 refills.  . ATORVASTATIN CALCIUM 40 MG PO TABS Oral Take 1 tablet (40 mg total) by mouth daily. 90 tablet 3  . BECLOMETHASONE DIPROPIONATE 80 MCG/ACT IN AERS Inhalation Inhale 2 puffs into the lungs 2 (two) times daily. 1 Inhaler 11  . CIPROFLOXACIN HCL 500 MG PO TABS Oral Take 1 tablet (500 mg total) by mouth 2 (two) times daily. 20 tablet 0  . LEVALBUTEROL TARTRATE 45 MCG/ACT IN AERO Inhalation Inhale 2 puffs into the lungs 4 (four) times daily as needed. As rescue inhaler    .  LEVALBUTEROL HCL 1.25 MG/0.5ML IN NEBU Nebulization Take 1 ampule by nebulization See admin instructions. May take three times a day - normally only takes one treatment daily    . LEVOTHYROXINE SODIUM 100 MCG PO TABS Oral Take 100 mcg by mouth daily.    [name] METRONIDAZOLE 500 MG PO TABS Oral Take 1 tablet (500 mg total) by mouth 4 (four) times daily. 40 tablet 0  . CENTRUM SILVER PO Oral Take 1 tablet by mouth daily.     [name] PROMETHAZINE HCL 25 MG PO TABS Oral Take 1-2 tablets (25-50 mg total) by mouth 4 (four) times daily as needed for nausea. 30 tablet 0    BP 173/84  Pulse 93  Temp 98.3 F (36.8 C) (Oral)  Resp  16  SpO2 98%  Physical Exam  Constitutional: She is oriented to person, place, and time. She appears well-developed and well-nourished. No distress.  HENT:  Head: Normocephalic and atraumatic.  Mouth/Throat: Oropharynx is clear and moist. No oropharyngeal exudate.  Eyes: Conjunctivae normal are normal. Pupils are equal, round, and reactive to light.  Neck: Normal range of motion. Neck supple.  Cardiovascular: Normal rate, regular rhythm and normal heart sounds.   Pulmonary/Chest: Effort normal and breath sounds normal. No respiratory distress.  Abdominal: Soft. There is tenderness. There is guarding. There is no rebound.       TTP LLQ with guarding  Musculoskeletal: Normal range of motion. She exhibits no edema and no tenderness.  Neurological: She is alert and oriented to person, place, and time. No cranial nerve deficit.  Skin: Skin is warm.    ED Course  Procedures (including critical care time)  Labs Reviewed  CBC WITH DIFFERENTIAL - Abnormal; Notable for the following:    Monocytes Relative 14 (*)     All other components within normal limits  COMPREHENSIVE METABOLIC PANEL - Abnormal; Notable for the following:    Sodium 130 (*)     Chloride 91 (*)     Glucose, Bld 115 (*)     BUN 4 (*)     GFR calc non Af Amer 81 (*)     All other components within normal limits  URINALYSIS, ROUTINE W REFLEX MICROSCOPIC - Abnormal; Notable for the following:    Leukocytes, UA SMALL (*)     All other components within normal limits  LACTIC ACID, PLASMA  LIPASE, BLOOD  OCCULT BLOOD, POC DEVICE  URINE MICROSCOPIC-ADD ON  STOOL CULTURE  CLOSTRIDIUM DIFFICILE BY PCR   Ct Abdomen Pelvis W Contrast  04/06/2012  *RADIOLOGY REPORT*  Clinical Data: Nausea, emesis.  CT ABDOMEN AND PELVIS WITH CONTRAST  Technique:  Multidetector CT imaging of the abdomen and pelvis was performed following the standard protocol during bolus administration of intravenous contrast.  Contrast: OMNIPAQUE  IOHEXOL 300 MG/ML  SOLN  Comparison: 12/21/2011  Findings: Calcified pleural plaque on the right. Right posterior complex fluid versus pleural thickening, similar to prior. Emphysematous changes.  Heart size within normal limits.  Small hiatal hernia.  There are a couple hypodensities within the liver, nonspecific however similar to prior.  Unremarkable spleen, biliary system, pancreas, adrenal glands.  Symmetric renal enhancement.  No hydronephrosis or hydroureter  Colonic diverticulosis.  Normal appendix.  No bowel obstruction. No free intraperitoneal air or fluid.  No lymphadenopathy.  There is scattered atherosclerotic calcification of the aorta and its branches. No aneurysmal dilatation.  Retroaortic left renal vein.  Thin-walled bladder.  Absent uterus.  No adnexal mass.  Multilevel degenerative changes  of the imaged spine. No acute or aggressive appearing osseous lesion.  IMPRESSION: Chronic pleural thickening or complex fluid along the periphery the right hemithorax is similar to prior.  Emphysema.  Small hiatal hernia.  Colonic diverticulosis.  No CT evidence for diverticulitis.  No bowel obstruction.  Advanced atherosclerotic disease.   Original Report Authenticated By: Waneta Martins, M.D.      No diagnosis found.    MDM  LLQ pain with nausea, vomiting, diarrhea, recently treated for diverticulitis.  Symptoms persist, unable to tolerate PO.  IVF, labs, symptom control, CT to evaluate further. D/w PAC [place] in CDU.    Glynn Octave, MD 04/06/12 [phone]

## 2012-04-06 NOTE — ED Provider Notes (Signed)
Patient to move to CDU holding for CT abd/pelvis, admission.  Sign out received from Dr Manus Gunning.  Patient with LLQ pain, N/V/D, not tolerating PO.  Failed outpatient treatment for diverticulitis (cipro, flagyl).  Plan is for CT scan followed by admission.    Patient seen and examined in ED, discussed CT results with her.  No diverticulitis.  Labs are unremarkable.  Pt reports she has not vomited today.  Has had diarrhea only twice today.  Abdomen is soft, TTP LLQ, no guarding, no rebound.  Discussed with Dr Manus Gunning.  Will place patient on dehydration protocol overnight.    3:47 PM Discussed patient with Fayrene Helper, PA-C, who assumes care of patient at change of shift.     Results for orders placed during the hospital encounter of 04/06/12  CBC WITH DIFFERENTIAL      Component Value Range   WBC 5.7  4.0 - 10.5 K/uL   RBC 3.92  3.87 - 5.11 MIL/uL   Hemoglobin 12.7  12.0 - 15.0 g/dL   HCT 16.1  09.6 - 04.5 %   MCV 96.7  78.0 - 100.0 fL   MCH 32.4  26.0 - 34.0 pg   MCHC 33.5  30.0 - 36.0 g/dL   RDW 40.9  81.1 - 91.4 %   Platelets 207  150 - 400 K/uL   Neutrophils Relative 71  43 - 77 %   Neutro Abs 4.1  1.7 - 7.7 K/uL   Lymphocytes Relative 15  12 - 46 %   Lymphs Abs 0.9  0.7 - 4.0 K/uL   Monocytes Relative 14 (*) 3 - 12 %   Monocytes Absolute 0.8  0.1 - 1.0 K/uL   Eosinophils Relative 0  0 - 5 %   Eosinophils Absolute 0.0  0.0 - 0.7 K/uL   Basophils Relative 0  0 - 1 %   Basophils Absolute 0.0  0.0 - 0.1 K/uL  COMPREHENSIVE METABOLIC PANEL      Component Value Range   Sodium 130 (*) 135 - 145 mEq/L   Potassium 3.5  3.5 - 5.1 mEq/L   Chloride 91 (*) 96 - 112 mEq/L   CO2 29  19 - 32 mEq/L   Glucose, Bld 115 (*) 70 - 99 mg/dL   BUN 4 (*) 6 - 23 mg/dL   Creatinine, Ser 7.82  0.50 - 1.10 mg/dL   Calcium 9.6  8.4 - 95.6 mg/dL   Total Protein 7.4  6.0 - 8.3 g/dL   Albumin 3.6  3.5 - 5.2 g/dL   AST 31  0 - 37 U/L   ALT 18  0 - 35 U/L   Alkaline Phosphatase 92  39 - 117 U/L   Total  Bilirubin 0.7  0.3 - 1.2 mg/dL   GFR calc non Af Amer 81 (*) >90 mL/min   GFR calc Af Amer >90  >90 mL/min  URINALYSIS, ROUTINE W REFLEX MICROSCOPIC      Component Value Range   Color, Urine YELLOW  YELLOW   APPearance CLEAR  CLEAR   Specific Gravity, Urine 1.005  1.005 - 1.030   pH 7.0  5.0 - 8.0   Glucose, UA NEGATIVE  NEGATIVE mg/dL   Hgb urine dipstick NEGATIVE  NEGATIVE   Bilirubin Urine NEGATIVE  NEGATIVE   Ketones, ur NEGATIVE  NEGATIVE mg/dL   Protein, ur NEGATIVE  NEGATIVE mg/dL   Urobilinogen, UA 0.2  0.0 - 1.0 mg/dL   Nitrite NEGATIVE  NEGATIVE   Leukocytes, UA SMALL (*)  NEGATIVE  LACTIC ACID, PLASMA      Component Value Range   Lactic Acid, Venous 1.0  0.5 - 2.2 mmol/L  LIPASE, BLOOD      Component Value Range   Lipase 22  11 - 59 U/L  OCCULT BLOOD, POC DEVICE      Component Value Range   Fecal Occult Bld NEGATIVE    URINE MICROSCOPIC-ADD ON      Component Value Range   Squamous Epithelial / LPF RARE  RARE   WBC, UA 0-2  <3 WBC/hpf   Bacteria, UA RARE  RARE   Ct Abdomen Pelvis W Contrast  04/06/2012  *RADIOLOGY REPORT*  Clinical Data: Nausea, emesis.  CT ABDOMEN AND PELVIS WITH CONTRAST  Technique:  Multidetector CT imaging of the abdomen and pelvis was performed following the standard protocol during bolus administration of intravenous contrast.  Contrast: OMNIPAQUE IOHEXOL 300 MG/ML  SOLN  Comparison: 12/21/2011  Findings: Calcified pleural plaque on the right. Right posterior complex fluid versus pleural thickening, similar to prior. Emphysematous changes.  Heart size within normal limits.  Small hiatal hernia.  There are a couple hypodensities within the liver, nonspecific however similar to prior.  Unremarkable spleen, biliary system, pancreas, adrenal glands.  Symmetric renal enhancement.  No hydronephrosis or hydroureter  Colonic diverticulosis.  Normal appendix.  No bowel obstruction. No free intraperitoneal air or fluid.  No lymphadenopathy.  There is  scattered atherosclerotic calcification of the aorta and its branches. No aneurysmal dilatation.  Retroaortic left renal vein.  Thin-walled bladder.  Absent uterus.  No adnexal mass.  Multilevel degenerative changes of the imaged spine. No acute or aggressive appearing osseous lesion.  IMPRESSION: Chronic pleural thickening or complex fluid along the periphery the right hemithorax is similar to prior.  Emphysema.  Small hiatal hernia.  Colonic diverticulosis.  No CT evidence for diverticulitis.  No bowel obstruction.  Advanced atherosclerotic disease.   Original Report Authenticated By: Waneta Martins, M.D.       [place], [place] 04/06/12 1547

## 2012-04-06 NOTE — ED Provider Notes (Signed)
Medical screening examination/treatment/procedure(s) were conducted as a shared visit with non-physician practitioner(s) and myself.  I personally evaluated the patient during the encounter   Glynn Octave, MD 04/06/12 1704

## 2012-04-06 NOTE — ED Notes (Addendum)
Dx. Diverticulitis this past wed. Still having n/v, diarrhea. Weak dizzy. Cannot take the medicine. Took cipro last night and kept down. Phenergan did not help with nausea. Stools tarry all weak.

## 2012-04-06 NOTE — ED Notes (Signed)
Pt taken to the bathroom with one assist. No distress noted.

## 2012-04-06 NOTE — ED Notes (Signed)
Pt drinking contrast at this time.

## 2012-04-07 NOTE — ED Provider Notes (Signed)
Pt improved abd soft on my exam Feels well for d/c home We discussed CT results Stable for d/c BP 166/77  Pulse 86  Temp 97.9 F (36.6 C) (Oral)  Resp 16  SpO2 100%   Joya Gaskins, MD 04/07/12 [phone]

## 2012-04-07 NOTE — ED Notes (Signed)
Pt. Tolerating clear oral fluids without any problems.

## 2012-04-07 NOTE — ED Provider Notes (Signed)
Medical screening examination/treatment/procedure(s) were conducted as a shared visit with non-physician practitioner(s) and myself.  I personally evaluated the patient during the encounter   Glynn Octave, MD 04/07/12 [phone]

## 2012-04-11 ENCOUNTER — Encounter: Payer: Self-pay | Admitting: Internal Medicine

## 2012-04-11 ENCOUNTER — Ambulatory Visit (INDEPENDENT_AMBULATORY_CARE_PROVIDER_SITE_OTHER): Payer: Medicare Other | Admitting: Internal Medicine

## 2012-04-11 VITALS — BP 122/68 | HR 90 | Temp 97.1°F | Ht 67.0 in | Wt 166.0 lb

## 2012-04-11 DIAGNOSIS — J449 Chronic obstructive pulmonary disease, unspecified: Secondary | ICD-10-CM

## 2012-04-11 DIAGNOSIS — K5289 Other specified noninfective gastroenteritis and colitis: Secondary | ICD-10-CM

## 2012-04-11 DIAGNOSIS — Z23 Encounter for immunization: Secondary | ICD-10-CM

## 2012-04-11 DIAGNOSIS — R1032 Left lower quadrant pain: Secondary | ICD-10-CM

## 2012-04-11 DIAGNOSIS — K529 Noninfective gastroenteritis and colitis, unspecified: Secondary | ICD-10-CM

## 2012-04-11 DIAGNOSIS — E860 Dehydration: Secondary | ICD-10-CM

## 2012-04-11 NOTE — Assessment & Plan Note (Signed)
2LPM [place] O2 qhs and prn - Follows with pulm for same- stable +/- improved

## 2012-04-11 NOTE — Patient Instructions (Signed)
It was good to see you today. We have reviewed your prior records including labs and tests today Medications reviewed, no changes at this time.  Advance diet as discussed, go slow!  B.R.A.T. Diet Your doctor has recommended the B.R.A.T. diet for you or your child until the condition improves. This is often used to help control diarrhea and vomiting symptoms. If you or your child can tolerate clear liquids, you may have:  Bananas.   Rice.   Applesauce.   Toast (and other simple starches such as crackers, potatoes, noodles).  Be sure to avoid dairy products, meats, and fatty foods until symptoms are better. Fruit juices such as apple, grape, and prune juice can make diarrhea worse. Avoid these. Continue this diet for 2 days or as instructed by your caregiver. Document Released: 06/06/2005 Document Revised: 05/26/2011 Document Reviewed: 11/23/2006 [facility] Patient Information 2012 [place], [place].Dehydration, Adult Dehydration means your body does not have as much fluid as it needs. Your kidneys, brain, and heart will not work properly without the right amount of fluids and salt.   HOME CARE  Ask your doctor how to replace body fluid losses (rehydrate).   Drink enough fluids to keep your pee (urine) clear or pale yellow.   Drink small amounts of fluids often if you feel sick to your stomach (nauseous) or throw up (vomit).   Eat like you normally do.   Avoid:   Foods or drinks high in sugar.   Bubbly (carbonated) drinks.   Juice.   Very hot or cold fluids.   Drinks with caffeine.   Fatty, greasy foods.   Alcohol.   Tobacco.   Eating too much.   Gelatin desserts.   Wash your hands to avoid spreading germs (bacteria, viruses).   Only take medicine as told by your doctor.   Keep all doctor visits as told.  GET HELP RIGHT AWAY IF:    You cannot drink something without throwing up.   You get worse even with treatment.   Your vomit has blood in it or looks  greenish.   Your poop (stool) has blood in it or looks black and tarry.   You have not peed in 6 to 8 hours.   You pee a small amount of very dark pee.   You have a fever.   You pass out (faint).   You have belly (abdominal) pain that gets worse or stays in one spot (localizes).   You have a rash, stiff neck, or bad headache.   You get easily annoyed, sleepy, or are hard to wake up.   You feel weak, dizzy, or very thirsty.  MAKE SURE YOU:    Understand these instructions.   Will watch your condition.   Will get help right away if you are not doing well or get worse.  Document Released: 04/02/2009 Document Revised: 08/29/2011 Document Reviewed: 01/24/2011 [facility] Patient Information 2013 [place], [place].

## 2012-04-11 NOTE — Progress Notes (Signed)
Subjective:    Patient ID: [name], female    DOB: 1933-01-04, 78 y.o.   MRN: [id number]  HPI  Here for followup, left lower quadrant abdominal pain with nausea vomiting and diarrhea Seen by my partner October 17> resume diagnosis diverticulitis, begin empiric Cipro and Flagyl Seen in the emergency room October 18 because of inability to tolerate oral antibiotics with continued pain, nausea and diarrhea symptoms Treated for dehydration, improved remains on clear liquid diet  Past Medical History  Diagnosis Date  . Allergic rhinitis   . COPD (chronic obstructive pulmonary disease)   . Pelvic cyst   . Bladder atony   . Unspecified hypothyroidism   . Hyperlipidemia   . Non-small cell carcinoma of lung, stage 1 2005    1.4 cm Poorly differentiated Squamous cell RUL  T1N0 resected 09/22/2003    Review of Systems  Constitutional: Positive for fatigue. Negative for fever.  Respiratory: Negative for cough.   Cardiovascular: Negative for chest pain and leg swelling.  Gastrointestinal: Negative for abdominal pain, abdominal distention and anal bleeding.       Objective:   Physical Exam BP 122/68  Pulse 90  Temp 97.1 F (36.2 C) (Oral)  Ht 5\' 7"  (1.702 m)  Wt 166 lb (75.297 kg)  BMI 26.00 kg/m2  SpO2 96% Wt Readings from Last 3 Encounters:  04/11/12 166 lb (75.297 kg)  04/04/12 164 lb 8 oz (74.617 kg)  02/22/12 165 lb 12.8 oz (75.206 kg)   Constitutional: She appears well-developed and well-nourished. No distress. spouse at side Eyes: Conjunctivae and EOM are normal. Pupils are equal, round, and reactive to light. No scleral icterus.  Neck: Normal range of motion. Neck supple. No JVD present. No thyromegaly present.  Cardiovascular: Normal rate, regular rhythm and normal heart sounds.  No murmur heard. No BLE edema. Pulmonary/Chest: Effort normal and breath sounds normal. No respiratory distress. She has no wheezes.  Abdominal: Soft. Bowel sounds are normal. She  exhibits no distension. There is no tenderness. no masses Neurological: She is alert and oriented to person, place, and time. No cranial nerve deficit. Coordination normal.  Skin: Skin is warm and dry. No rash noted. No erythema.  Psychiatric: She has a normal mood and affect. Her behavior is normal. Judgment and thought content normal.   Lab Results  Component Value Date   WBC 5.7 04/06/2012   HGB 12.7 04/06/2012   HCT 37.9 04/06/2012   PLT 207 04/06/2012   GLUCOSE 115* 04/06/2012   CHOL 237* 02/22/2012   TRIG 91.0 02/22/2012   HDL 97.80 02/22/2012   LDLDIRECT 127.6 02/22/2012   LDLCALC 71 10/30/2009   ALT 18 04/06/2012   AST 31 04/06/2012   NA 130* 04/06/2012   K 3.5 04/06/2012   CL 91* 04/06/2012   CREATININE 0.72 04/06/2012   BUN 4* 04/06/2012   CO2 29 04/06/2012   TSH 2.62 02/22/2012   INR 0.94 02/05/2010   HGBA1C 5.2 02/22/2012   Ct Abdomen Pelvis W Contrast  04/06/2012  *RADIOLOGY REPORT*  Clinical Data: Nausea, emesis.  CT ABDOMEN AND PELVIS WITH CONTRAST  Technique:  Multidetector CT imaging of the abdomen and pelvis was performed following the standard protocol during bolus administration of intravenous contrast.  Contrast: OMNIPAQUE IOHEXOL 300 MG/ML  SOLN  Comparison: 12/21/2011  Findings: Calcified pleural plaque on the right. Right posterior complex fluid versus pleural thickening, similar to prior. Emphysematous changes.  Heart size within normal limits.  Small hiatal hernia.  There are a  couple hypodensities within the liver, nonspecific however similar to prior.  Unremarkable spleen, biliary system, pancreas, adrenal glands.  Symmetric renal enhancement.  No hydronephrosis or hydroureter  Colonic diverticulosis.  Normal appendix.  No bowel obstruction. No free intraperitoneal air or fluid.  No lymphadenopathy.  There is scattered atherosclerotic calcification of the aorta and its branches. No aneurysmal dilatation.  Retroaortic left renal vein.  Thin-walled bladder.  Absent  uterus.  No adnexal mass.  Multilevel degenerative changes of the imaged spine. No acute or aggressive appearing osseous lesion.  IMPRESSION: Chronic pleural thickening or complex fluid along the periphery the right hemithorax is similar to prior.  Emphysema.  Small hiatal hernia.  Colonic diverticulosis.  No CT evidence for diverticulitis.  No bowel obstruction.  Advanced atherosclerotic disease.   Original Report Authenticated By: Waneta Martins, M.D.       Assessment & Plan:   Gastroenteritis with LLQ pain, nausea and vomiting, diarrhea - much improved - reviewed CT results and ER visit - no need for additional antibiotics  Dehydration with hyponatremia due to gastroenteritis - resolved with IVF in ER  Time spent with pt/family today 26 minutes, greater than 50% time spent counseling patient on gastroenteritis and ER visit + medication review. Also review of prior records

## 2012-05-21 ENCOUNTER — Other Ambulatory Visit: Payer: Self-pay | Admitting: Internal Medicine

## 2012-05-21 DIAGNOSIS — Z1231 Encounter for screening mammogram for malignant neoplasm of breast: Secondary | ICD-10-CM

## 2012-05-23 ENCOUNTER — Ambulatory Visit
Admission: RE | Admit: 2012-05-23 | Discharge: 2012-05-23 | Disposition: A | Discharge status: Home / Self Care | Payer: Medicare Other | Source: Ambulatory Visit | Attending: Internal Medicine | Admitting: Internal Medicine

## 2012-05-23 DIAGNOSIS — Z1231 Encounter for screening mammogram for malignant neoplasm of breast: Secondary | ICD-10-CM

## 2012-06-17 ENCOUNTER — Other Ambulatory Visit: Payer: Self-pay | Admitting: Internal Medicine

## 2012-06-18 ENCOUNTER — Telehealth: Payer: Self-pay | Admitting: Internal Medicine

## 2012-06-18 MED ORDER — BECLOMETHASONE DIPROPIONATE 80 MCG/ACT IN AERS: 2.0000 | INHALATION_SPRAY | Freq: Two times a day (BID) | RESPIRATORY_TRACT | Status: DC

## 2012-06-18 NOTE — Telephone Encounter (Signed)
Rx has been sent in, pt is aware. 

## 2012-06-20 ENCOUNTER — Other Ambulatory Visit: Payer: Self-pay | Admitting: Internal Medicine

## 2012-06-21 NOTE — Telephone Encounter (Signed)
Faxed script back to [facility].../lmb 

## 2012-07-01 IMAGING — CR DG CHEST 2V
2 series · 2 of 2 positions shown · non-contrast
Comparison: 06/22/2010

CLINICAL DATA: COPD exacerbation.  Status post lung cancer
resection.  Shortness of breath with chest pain and cough

CHEST - 2 VIEW

[view not recorded (1 of 2)]
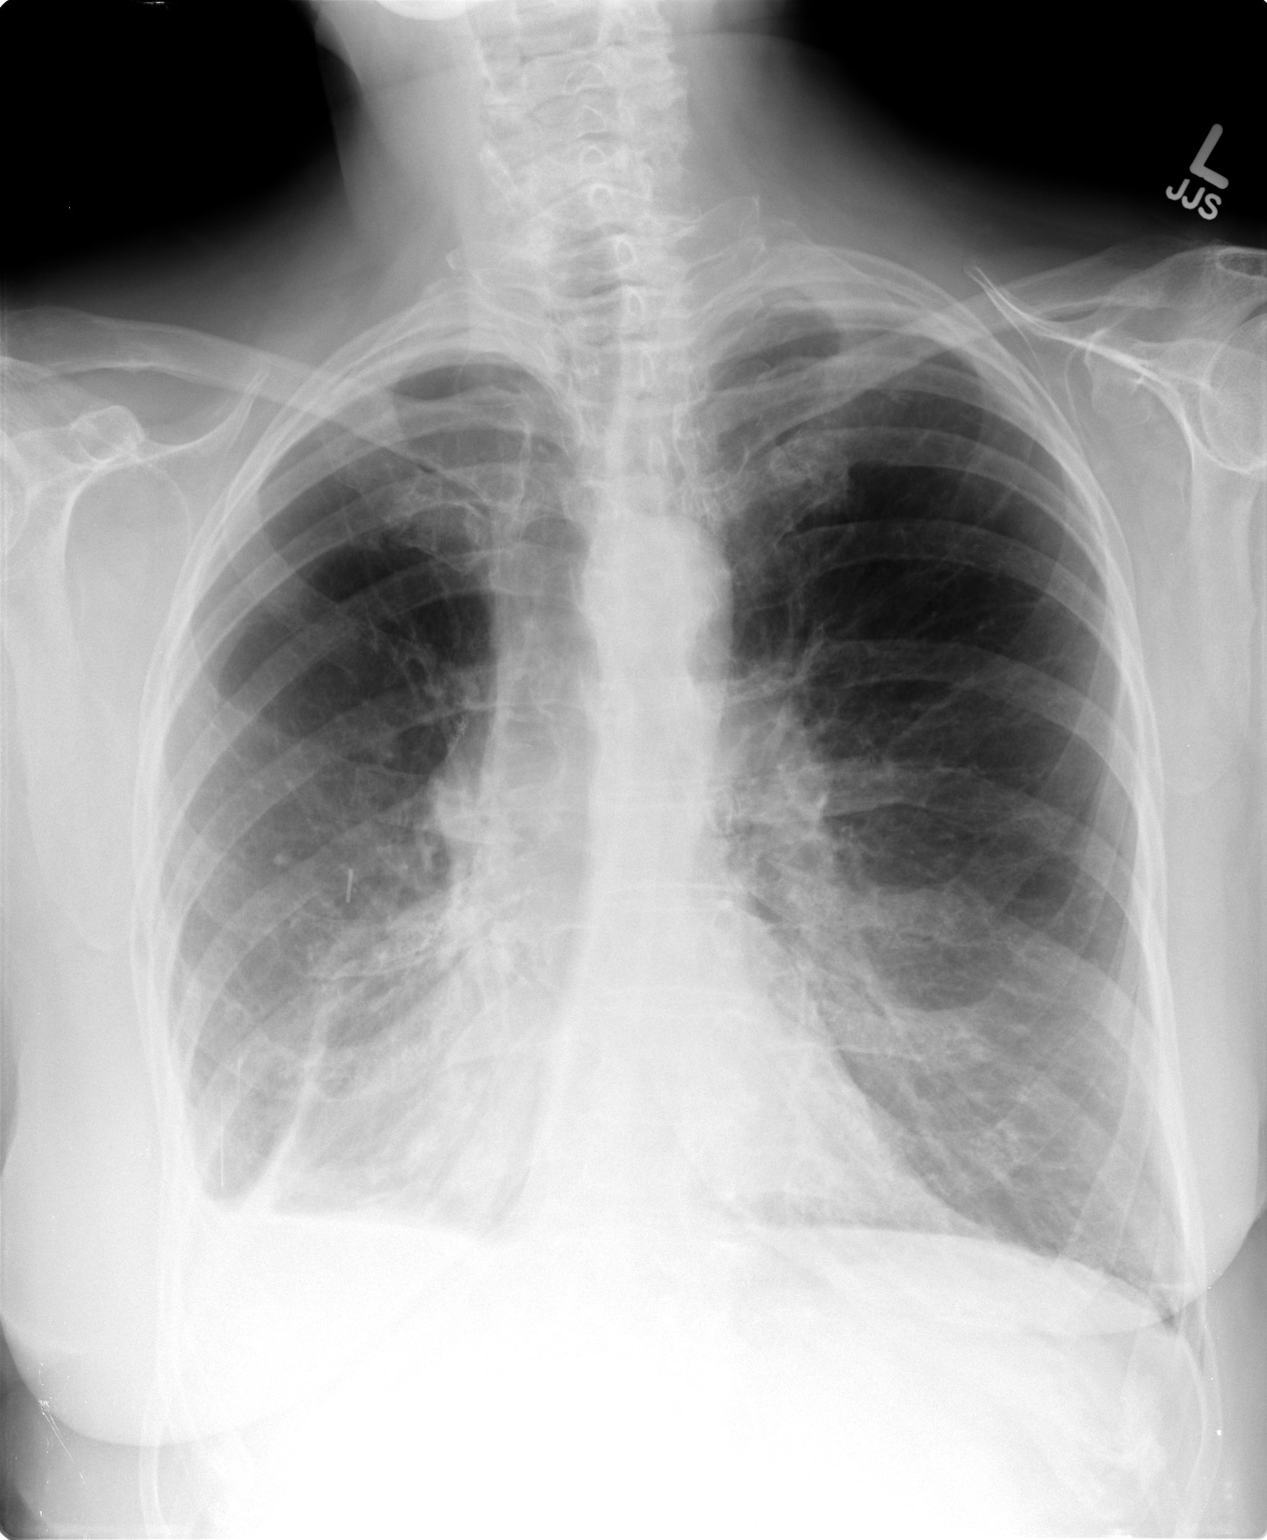

[view not recorded (2 of 2)]
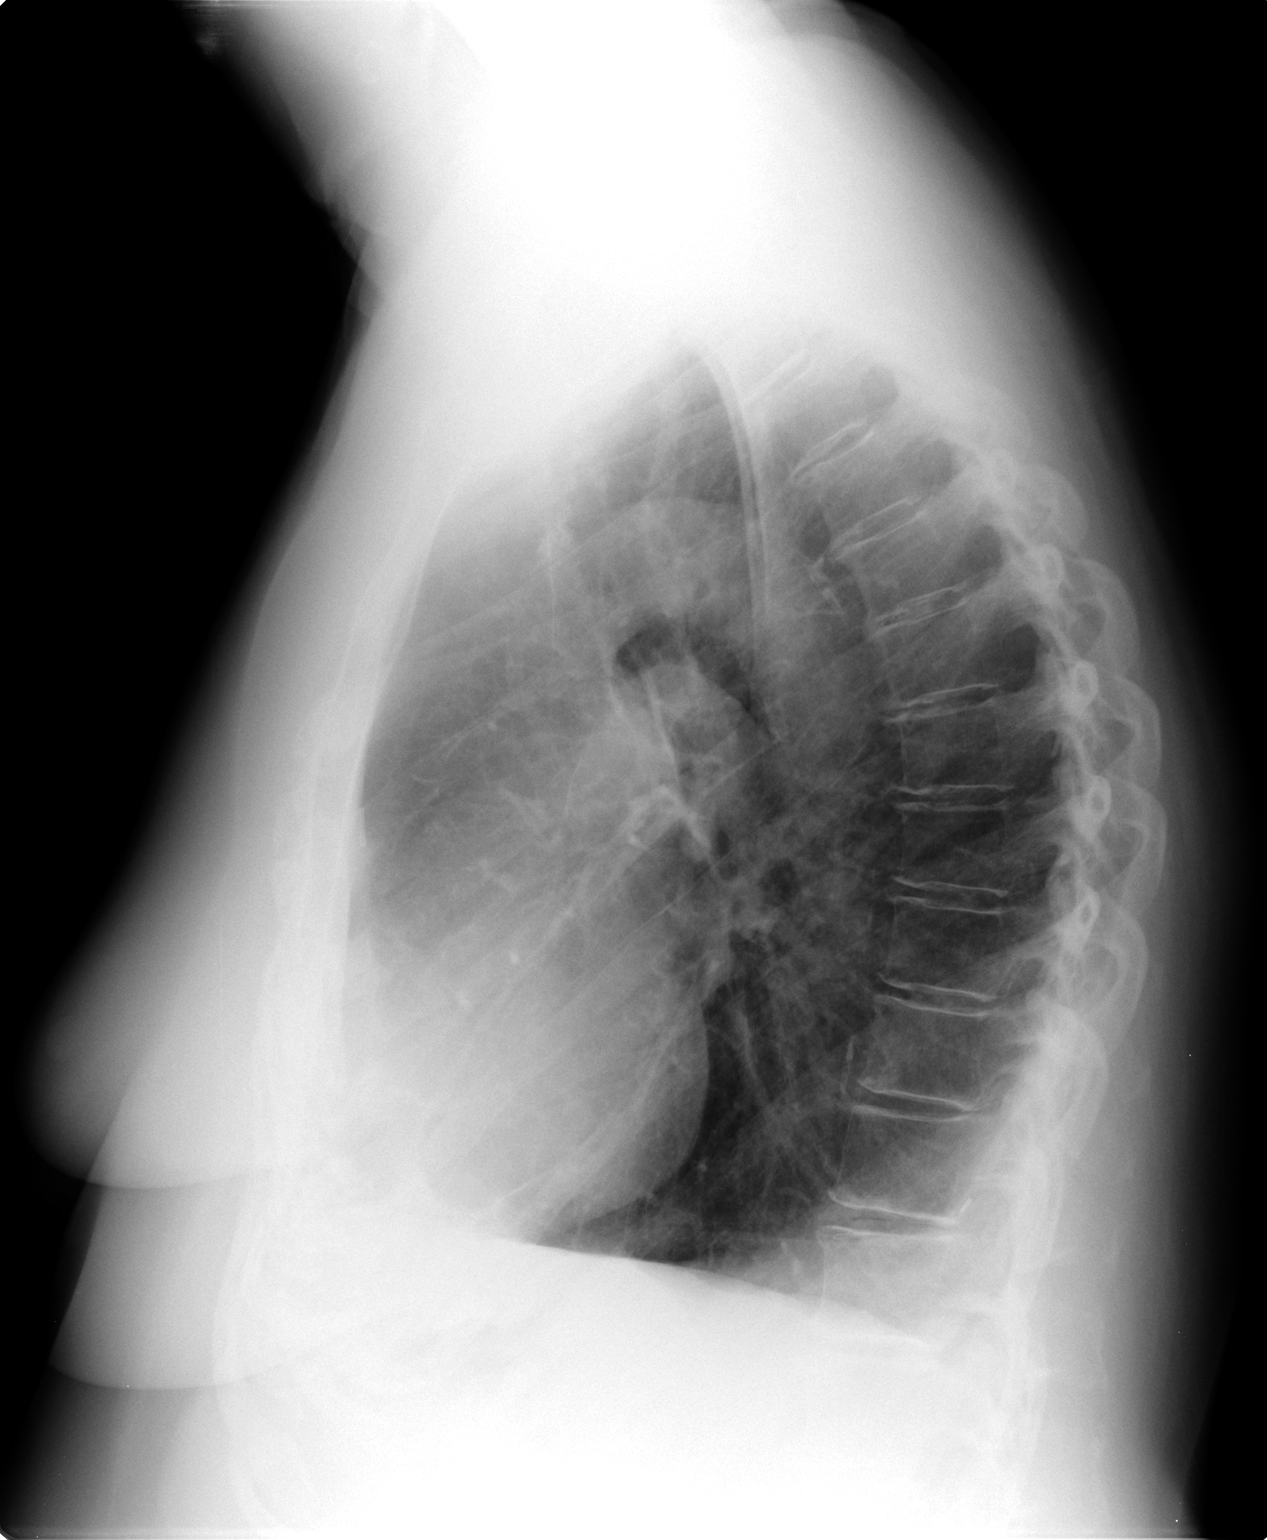

[2 of 2 positions shown; findings below may reference images not displayed]

FINDINGS: The patient is status post partial right pneumonectomy
and postsurgical scarring involving the right lower lobe is noted
and stable.  Underlying changes of COPD are evident and taking this
into consideration heart size is upper limits of normal and stable.
Prominence of the central pulmonary arteries may reflect some
underlying pulmonary arterial hypertension.

The lung fields are otherwise clear with no signs of focal
infiltrate or congestive failure.  No pleural fluid or significant
peribronchial cuffing is seen.

Bony structures are intact.
IMPRESSION: Stable postoperative appearance with no worrisome focal or acute
abnormality suggested.

## 2012-07-15 IMAGING — CT CT CHEST W/ CM
2 of 3 series · 15 of 36 positions shown, 18 images · IV contrast (agent unspecified)
Comparison: Chest radiograph dated 12/07/2010.

CLINICAL DATA: Lung cancer, status post partial right lobectomy.
XRT completed.  No chemotherapy.  Chest pain/cough.

CT CHEST WITH CONTRAST
TECHNIQUE: Multidetector CT imaging of the chest was performed
following the standard protocol during bolus administration of
intravenous contrast.
Contrast: 80 mL Mmnipaque-PAA IV

[Series 2: chest with st · axial · 0.81mm/px · z∈[-322,-46]mm · 12 of 65 slices shown, 15 images]
[im 5/65  mediastinal]
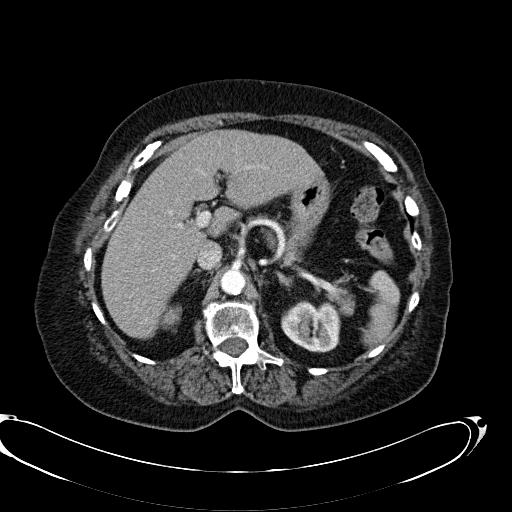
[im 5/65  lung]
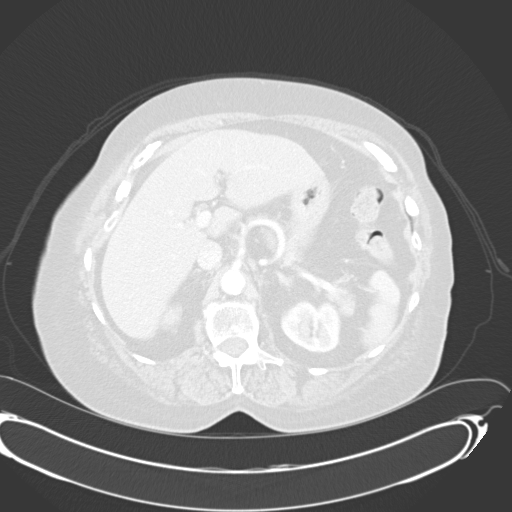
[im 10/65  lung]
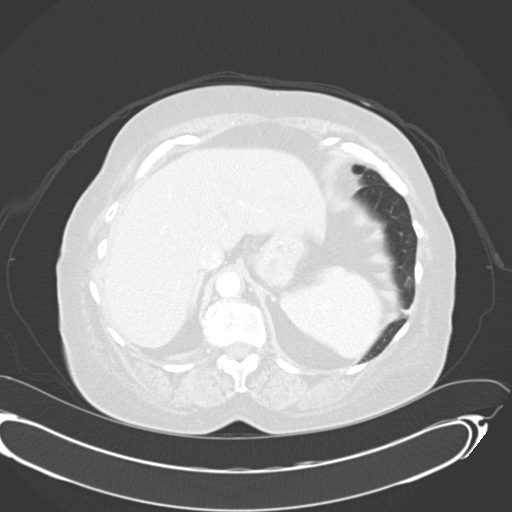
[im 15/65  lung]
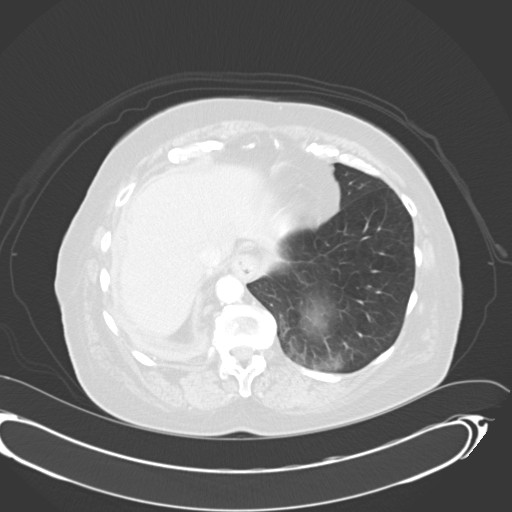
[im 19/65  lung]
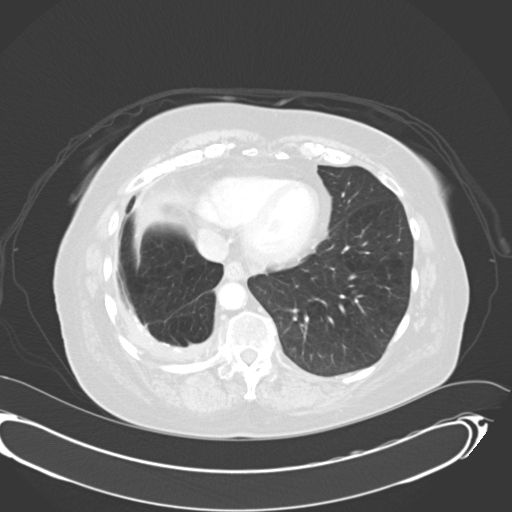
[im 24/65  mediastinal]
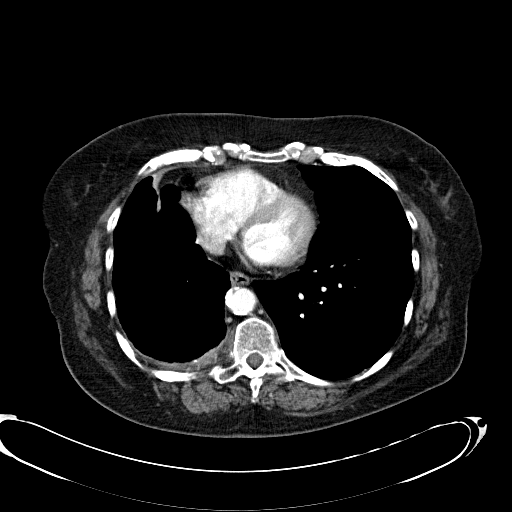
[im 24/65  lung]
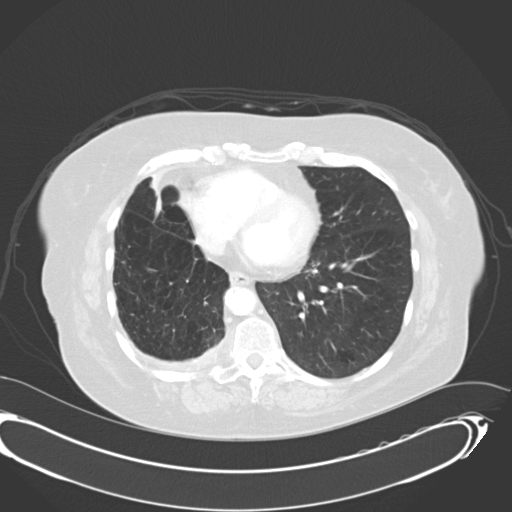
[im 29/65  lung]
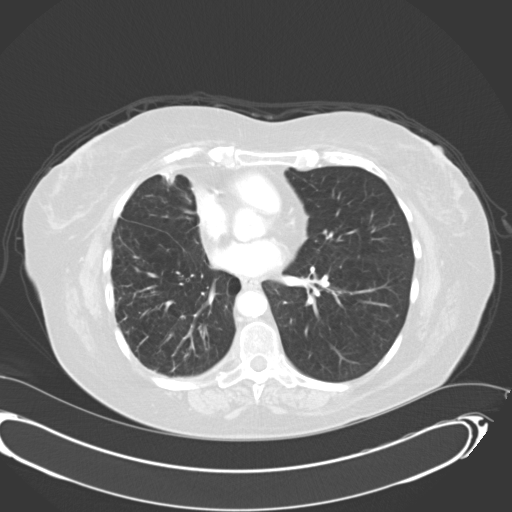
[im 36/65  lung]
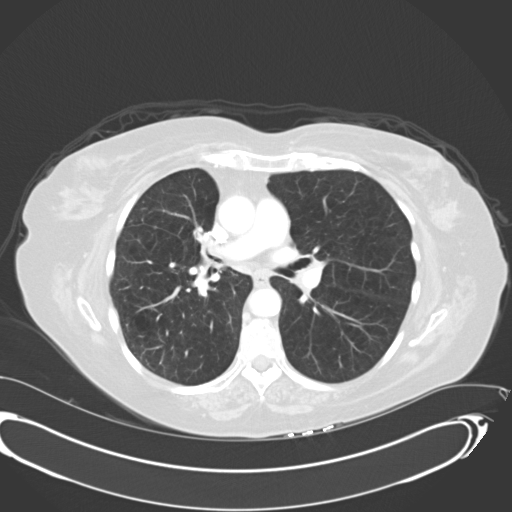
[im 41/65  lung]
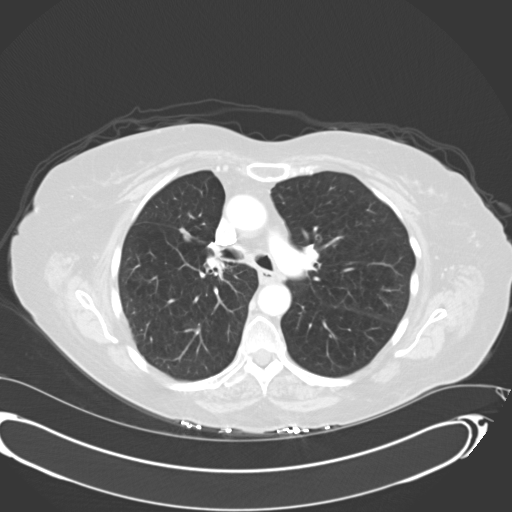
[im 46/65  mediastinal]
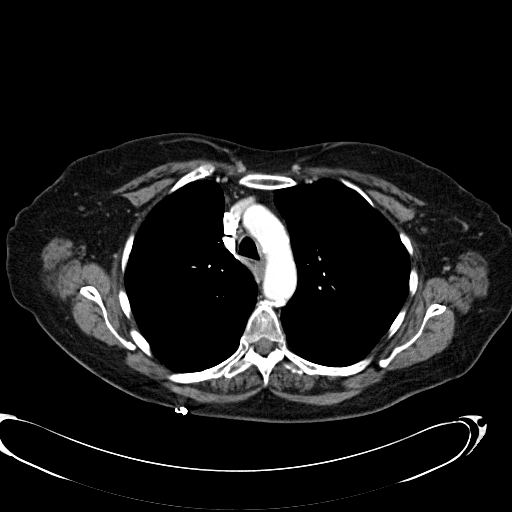
[im 46/65  lung]
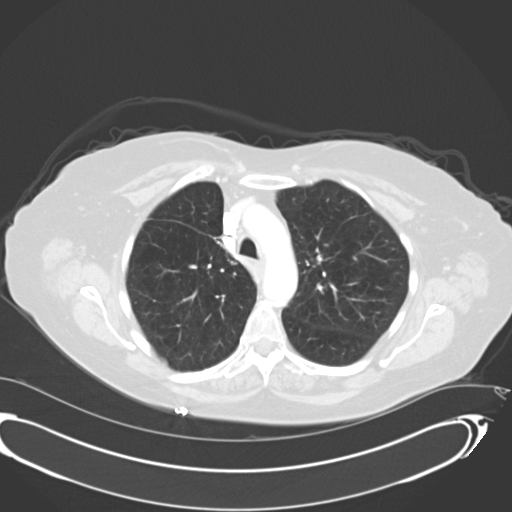
[im 50/65  lung]
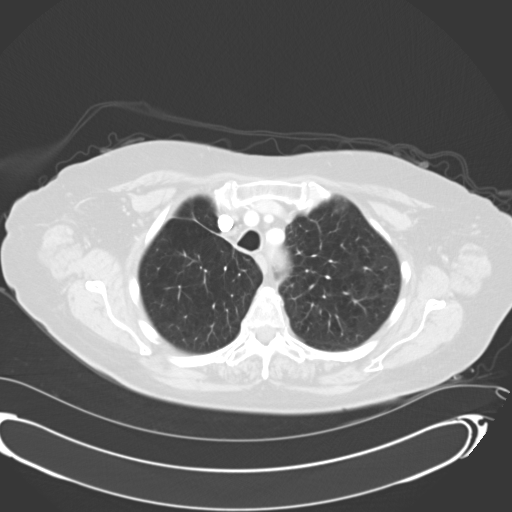
[im 55/65  lung]
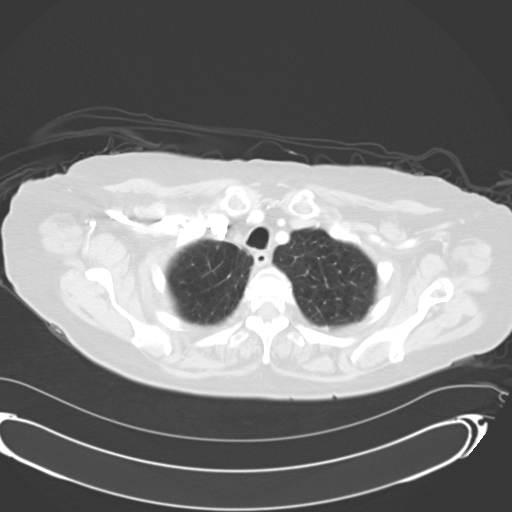
[im 60/65  lung]
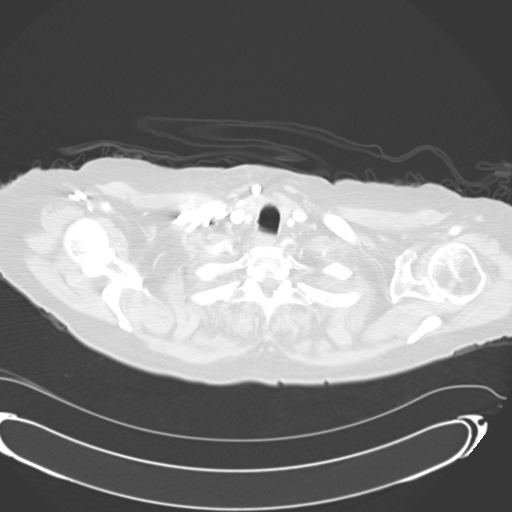

[Series 602: <mpr thick range> · coronal · 0.81mm/px · 3 of 93 slices shown]
[im 19/93  lung]
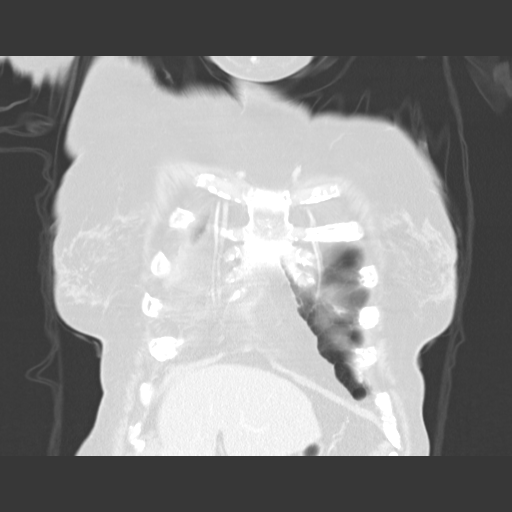
[im 37/93  lung]
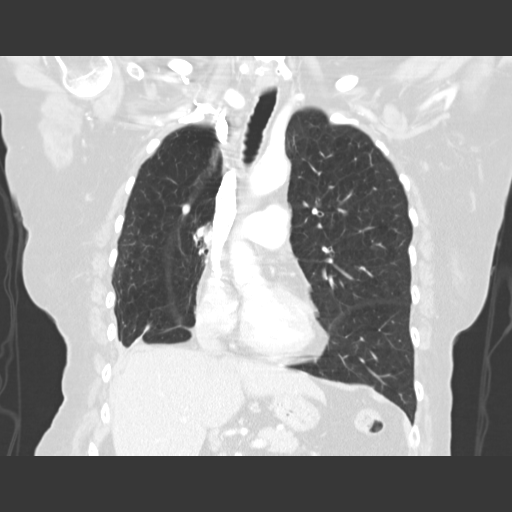
[im 56/93  lung]
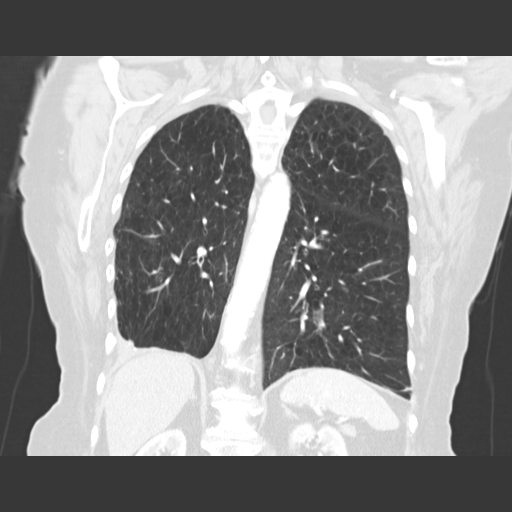

[15 of 36 positions shown; findings below may reference images not displayed]

FINDINGS: Visualized thyroid is unremarkable.

Centrilobular emphysema. Prior right upper lobectomy with
associated volume loss.  Stable 9 mm nodule along the right minor
fissure. No suspicious pulmonary nodules. Stable right pleural
thickening. No pleural effusion or pneumothorax.

The heart is normal in size.  No pericardial effusion.  Coronary
atherosclerosis.  Atherosclerotic calcifications of the aorta.

No suspicious mediastinal, hilar, or axillary lymphadenopathy.

Visualized upper abdomen is within normal limits.

Degenerative changes of the visualized thoracolumbar spine.
IMPRESSION: Postsurgical changes, as described above.  Stable 9 mm nodule along
the right minor fissure.  No suspicious pulmonary nodules.

Centrilobular emphysema.

## 2012-08-04 ENCOUNTER — Other Ambulatory Visit: Payer: Self-pay | Admitting: Internal Medicine

## 2012-08-08 ENCOUNTER — Other Ambulatory Visit: Payer: Self-pay | Admitting: *Deleted

## 2012-08-08 MED ORDER — LEVALBUTEROL HCL 1.25 MG/3ML IN NEBU: 1.2500 mg | INHALATION_SOLUTION | Freq: Three times a day (TID) | RESPIRATORY_TRACT | Status: DC | PRN

## 2012-08-14 ENCOUNTER — Telehealth: Payer: Self-pay | Admitting: Internal Medicine

## 2012-08-14 NOTE — Telephone Encounter (Signed)
Patient Information:  Caller Name: Suellyn  Phone: [phone]  Patient: [name], [name]  Gender: Female  DOB: February 02, 1933  Age: 79 Years  PCP: Rene Paci (Adults only)  Office Follow Up:  Does the office need to follow up with this patient?: No  Instructions For The Office: N/A  RN Note:  1-week history of urinary frequency, urgency, and burning, worsening over past 48 hours.  Afebrile.  Per urinary symptoms protocol, emergent symptoms denied; advised and offered appt in office today.  Declines appt today; requests appt 08/15/12.  Appt scheduled 08/15/12 1115 with Dr. Felicity Coyer.  krs/can  Symptoms  Reason For Call & Symptoms: urinary symptoms  Reviewed Health History In EMR: Yes  Reviewed Medications In EMR: Yes  Reviewed Allergies In EMR: Yes  Reviewed Surgeries / Procedures: Yes  Date of Onset of Symptoms: 08/07/2012  Guideline(s) Used:  Urination Pain - Female  Disposition Per Guideline:   See Today in Office  Reason For Disposition Reached:   Age > 50 years  Advice Given:  N/A  Patient Refused Recommendation:  Patient Refused Appt, Patient Requests Appt At Later Date  appt requested/given 08/15/12 1115 krs/can

## 2012-08-15 ENCOUNTER — Ambulatory Visit (INDEPENDENT_AMBULATORY_CARE_PROVIDER_SITE_OTHER): Payer: Medicare Other | Admitting: Internal Medicine

## 2012-08-15 ENCOUNTER — Encounter: Payer: Self-pay | Admitting: Internal Medicine

## 2012-08-15 VITALS — BP 130/72 | HR 75 | Temp 97.0°F | Wt 160.8 lb

## 2012-08-15 DIAGNOSIS — M1990 Unspecified osteoarthritis, unspecified site: Secondary | ICD-10-CM | POA: Insufficient documentation

## 2012-08-15 DIAGNOSIS — IMO0002 Reserved for concepts with insufficient information to code with codable children: Secondary | ICD-10-CM

## 2012-08-15 DIAGNOSIS — M129 Arthropathy, unspecified: Secondary | ICD-10-CM

## 2012-08-15 DIAGNOSIS — N390 Urinary tract infection, site not specified: Secondary | ICD-10-CM

## 2012-08-15 LAB — POCT URINALYSIS DIPSTICK
Bilirubin, UA: NEGATIVE
Nitrite, UA: NEGATIVE
Urobilinogen, UA: 0.2
pH, UA: 5

## 2012-08-15 MED ORDER — NAPROXEN SODIUM 220 MG PO TABS
220.0000 mg | ORAL_TABLET | Freq: Two times a day (BID) | ORAL | Status: DC | PRN
Start: 2012-08-15 — End: 2012-10-10

## 2012-08-15 MED ORDER — CIPROFLOXACIN HCL 250 MG PO TABS: 250.0000 mg | ORAL_TABLET | Freq: Two times a day (BID) | ORAL | Status: DC

## 2012-08-15 NOTE — Patient Instructions (Addendum)
It was good to see you today. Cipro for bladder infection - 2x/day x 1 week - Your prescription(s) have been submitted to your pharmacy. Please take as directed and contact our office if you believe you are having problem(s) with the medication(s). we'll make referral back to Dr Seymour Bars for your bladder prolapse . Our office will contact you regarding appointment(s) once made. Ok for [employer] of tylenol as needed for arthritis Keep follow up in April as planned for cholesterol and thyroid lab check, call sooner if problems

## 2012-08-15 NOTE — Progress Notes (Signed)
HPI: complains of UTI symptoms Onset >7 days ago, progressively worse associated with dysuria and small volume voiding with increased frequency denies hematuria, flank pain or fever The patient has a history of prior UTI  PMH: reviewed  ROS:  Gen.: No unexpected weight change, no night sweats Lungs: No cough or shortness of breath Cardiovascular: No palpitations or chest pain  PE: BP 130/72  Pulse 75  Temp(Src) 97 F (36.1 C) (Oral)  Wt 160 lb 12.8 oz (72.938 kg)  BMI 25.18 kg/m2  SpO2 92% General: No acute distress, spouse at side Lungs: Clear to auscultation Cardiovascular: Regular rate rhythm, no edema Abdomen: Mild to moderate discomfort of her suprapubic region, no flank tenderness to palpation GU: defer to gyn  Lab Results  Component Value Date   WBC 5.7 04/06/2012   HGB 12.7 04/06/2012   HCT 37.9 04/06/2012   PLT 207 04/06/2012   GLUCOSE 115* 04/06/2012   CHOL 237* 02/22/2012   TRIG 91.0 02/22/2012   HDL 97.80 02/22/2012   LDLDIRECT 127.6 02/22/2012   LDLCALC 71 10/30/2009   ALT 18 04/06/2012   AST 31 04/06/2012   NA 130* 04/06/2012   K 3.5 04/06/2012   CL 91* 04/06/2012   CREATININE 0.72 04/06/2012   BUN 4* 04/06/2012   CO2 29 04/06/2012   TSH 2.62 02/22/2012   INR 0.94 02/05/2010   HGBA1C 5.2 02/22/2012    Assessment/Plan: UTI, classic symptoms with history of same Cystocele, s/p gyn eval 07/2011 - declined pessary at that time but would like to reconsider same  Empiric antibiotic x7 days follow up with gyn Hydration recommended education provided

## 2012-08-15 NOTE — Assessment & Plan Note (Signed)
Describes neck and hand arthralgias, relieved with over-the-counter anti-inflammatory Okay for occasional use of same, alternate with Tylenol arthritis as needed Exam without effusions or gross deformities today

## 2012-08-16 ENCOUNTER — Ambulatory Visit: Payer: Medicare Other | Admitting: Internal Medicine

## 2012-08-22 ENCOUNTER — Other Ambulatory Visit: Payer: Self-pay | Admitting: Internal Medicine

## 2012-08-22 NOTE — Telephone Encounter (Signed)
Faxed script to [facility].../lmb 

## 2012-08-28 IMAGING — CR DG CHEST 2V
2 series · 2 of 2 positions shown · non-contrast
Comparison: Chest x-ray 12/07/2010 and chest CT 01/17/2011.

CLINICAL DATA: Asthma.  COPD.

CHEST - 2 VIEW

[view not recorded (1 of 2)]
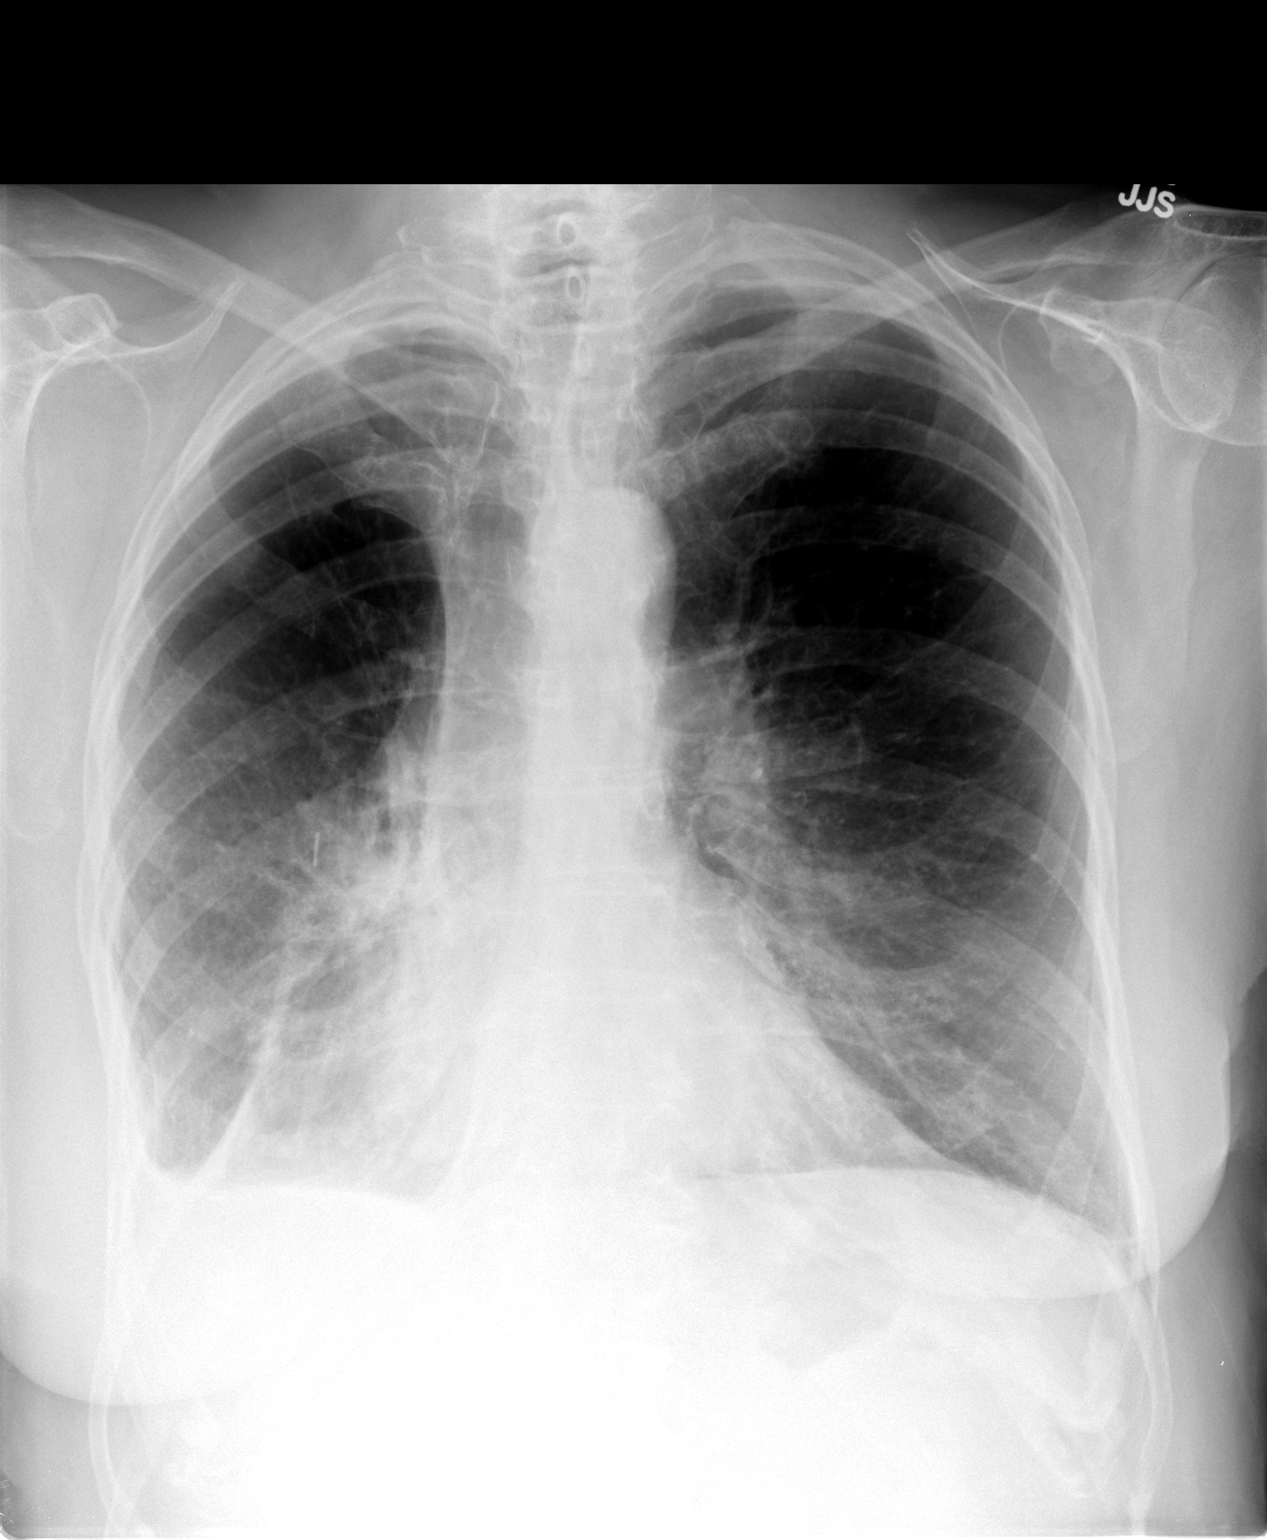

[view not recorded (2 of 2)]
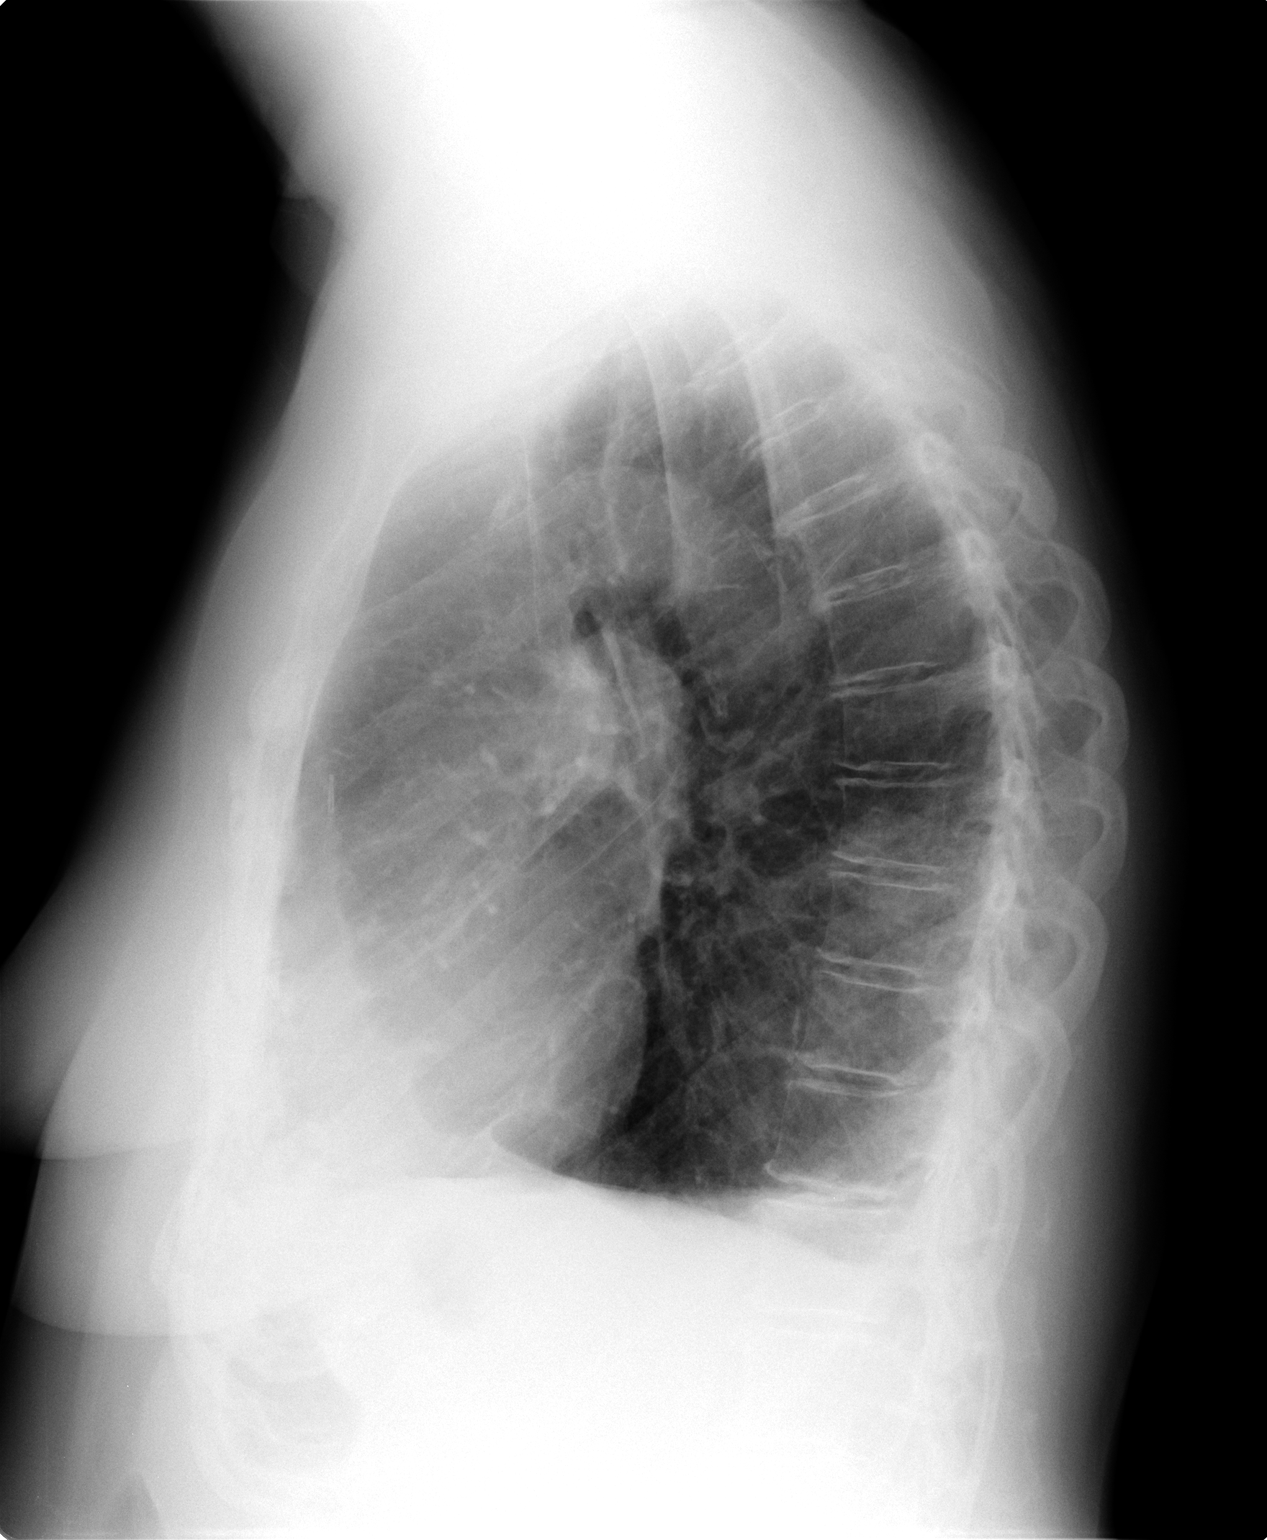

[2 of 2 positions shown; findings below may reference images not displayed]

FINDINGS: There are stable emphysematous changes and chronic right
lung scarring and postoperative changes.  No definite acute
overlying pulmonary process.  No pleural effusion.  The bony thorax
is intact.
IMPRESSION: 1.  Chronic lung changes/COPD and scarring.
2.  Stable postoperative changes.
3.  No acute pulmonary findings.

## 2012-09-03 ENCOUNTER — Ambulatory Visit: Payer: Medicare Other | Admitting: Internal Medicine

## 2012-09-06 ENCOUNTER — Other Ambulatory Visit: Payer: Self-pay | Admitting: Internal Medicine

## 2012-09-06 NOTE — Telephone Encounter (Signed)
Faxed script bck to [facility]...lmb 

## 2012-09-17 ENCOUNTER — Other Ambulatory Visit: Payer: Self-pay | Admitting: Internal Medicine

## 2012-09-20 ENCOUNTER — Other Ambulatory Visit: Payer: Self-pay | Admitting: Internal Medicine

## 2012-09-21 ENCOUNTER — Other Ambulatory Visit: Payer: Self-pay | Admitting: Internal Medicine

## 2012-09-21 NOTE — Telephone Encounter (Signed)
Faxed script back to [facility].../lmb 

## 2012-10-05 ENCOUNTER — Other Ambulatory Visit: Payer: Self-pay | Admitting: Internal Medicine

## 2012-10-08 NOTE — Telephone Encounter (Signed)
Faxed script bck to [facility]...lmb 

## 2012-10-10 ENCOUNTER — Ambulatory Visit (INDEPENDENT_AMBULATORY_CARE_PROVIDER_SITE_OTHER): Payer: Medicare Other | Admitting: Internal Medicine

## 2012-10-10 ENCOUNTER — Encounter: Payer: Self-pay | Admitting: Internal Medicine

## 2012-10-10 VITALS — BP 120/70 | HR 70 | Temp 97.6°F | Wt 156.0 lb

## 2012-10-10 DIAGNOSIS — R350 Frequency of micturition: Secondary | ICD-10-CM

## 2012-10-10 DIAGNOSIS — F419 Anxiety disorder, unspecified: Secondary | ICD-10-CM | POA: Insufficient documentation

## 2012-10-10 DIAGNOSIS — N8111 Cystocele, midline: Secondary | ICD-10-CM

## 2012-10-10 DIAGNOSIS — N8110 Cystocele, unspecified: Secondary | ICD-10-CM | POA: Insufficient documentation

## 2012-10-10 DIAGNOSIS — E039 Hypothyroidism, unspecified: Secondary | ICD-10-CM

## 2012-10-10 DIAGNOSIS — F411 Generalized anxiety disorder: Secondary | ICD-10-CM

## 2012-10-10 LAB — POCT URINALYSIS DIPSTICK
Bilirubin, UA: NEGATIVE
Blood, UA: NEGATIVE
Glucose, UA: NEGATIVE
Spec Grav, UA: <=1.005

## 2012-10-10 MED ORDER — TRAZODONE HCL 50 MG PO TABS: 25.0000 mg | ORAL_TABLET | Freq: Every evening | ORAL | Status: DC | PRN

## 2012-10-10 NOTE — Patient Instructions (Addendum)
It was good to see you today. We have reviewed your prior records including labs and tests today Medications reviewed and updated, try trazodone at bedtime for sleep every night -no other changes recommended at this time. Your prescription(s) have been submitted to your pharmacy. Please take as directed and contact our office if you believe you are having problem(s) with the medication(s). Please schedule followup in 6 months for cholesterol and thyroid check, call sooner if problems. Continue working with Dr Ernestina Penna about your bladder

## 2012-10-10 NOTE — Assessment & Plan Note (Signed)
associated with insomnia - chronic BZ use for same, but insurance limits #/mo Has stopped EtOH use (but still drinks wine) Try traz qhs - new erx done

## 2012-10-10 NOTE — Assessment & Plan Note (Signed)
Lab Results  Component Value Date   TSH 2.62 02/22/2012   The current medical regimen is effective;  continue present plan and medications.

## 2012-10-10 NOTE — Progress Notes (Signed)
  Subjective:    Patient ID: [name], female    DOB: [birth year], 79 y.o.   MRN: [id number]  HPI  Here for follow up- reviewed chronic medical issues:  COPD - no recent flares; follows with pulm for same - wearing O2 qhs - the patient reports compliance with medication(s) as prescribed. Denies adverse side effects.  Dyslipidemia - on statin - the patient reports compliance with medication(s) as prescribed. Denies adverse side effects. Request change to generic  Hypothyroid - the patient reports compliance with medication(s) as prescribed. Denies adverse side effects.  Gait disorder -accidental fall x 2 06/28/11, 06/10/11 and 01/2012  Frequent orthostatic symptoms with position changes> but intolerant of prior midodrine trial No confusion, vision changes, no incontinence or seizure - no loss of consciousness before or during fall - residual neck spasm and tightness Hip injection 01/2012 with [facility] - improved   Past Medical History  Diagnosis Date  . Allergic rhinitis   . COPD (chronic obstructive pulmonary disease)   . Pelvic cyst   . Bladder atony   . Unspecified hypothyroidism   . Hyperlipidemia   . Non-small cell carcinoma of lung, stage 1 2005    1.4 cm Poorly differentiated Squamous cell RUL  T1N0 resected 09/22/2003  . Arthritis     Review of Systems  Constitutional: Positive for fatigue. Negative for fever.  Respiratory: Negative for cough and choking.   Cardiovascular: Negative for chest pain and leg swelling.  Genitourinary: Positive for urgency and frequency. Negative for dysuria, enuresis, difficulty urinating and pelvic pain.  Musculoskeletal: Negative for joint swelling and gait problem.       Objective:   Physical Exam  BP 120/70  Pulse 70  Temp(Src) 97.6 F (36.4 C) (Oral)  Wt 156 lb (70.761 kg)  BMI 24.43 kg/m2  SpO2 98% Wt Readings from Last 3 Encounters:  10/10/12 156 lb (70.761 kg)  08/15/12 160 lb 12.8 oz (72.938 kg)  04/11/12 166 lb  (75.297 kg)   Constitutional: She appears well-developed and well-nourished. No distress. Neck: Normal range of motion. Neck supple, but mild spasm. No JVD present. No thyromegaly present.  Cardiovascular: Normal rate, regular rhythm and normal heart sounds.  No murmur heard. No BLE edema. Pulmonary/Chest: Effort normal and breath sounds diminished. No respiratory distress. She has no wheezes.  Neuro: AAOx4, CN2-12,   speech fluent without dysarthria, follows commands and MAE - walks independent Psychiatric: She has a minimally anxious mood and affect. Her behavior is normal. Judgment and thought content normal.   Lab Results  Component Value Date   WBC 5.7 04/06/2012   HGB 12.7 04/06/2012   HCT 37.9 04/06/2012   PLT 207 04/06/2012   CHOL 237* 02/22/2012   TRIG 91.0 02/22/2012   HDL 97.80 02/22/2012   LDLDIRECT 127.6 02/22/2012   ALT 18 04/06/2012   AST 31 04/06/2012   NA 130* 04/06/2012   K 3.5 04/06/2012   CL 91* 04/06/2012   CREATININE 0.72 04/06/2012   BUN 4* 04/06/2012   CO2 29 04/06/2012   TSH 2.62 02/22/2012   INR 0.94 02/05/2010   HGBA1C 5.2 02/22/2012      Assessment & Plan:   See problem list. Medications and labs reviewed today.  Chronic dysuria and stress incont - known prolapse and working with gyn on same, intol of 1st pessary trial, not yet rescheduled for 2nd trial

## 2012-10-15 ENCOUNTER — Ambulatory Visit (INDEPENDENT_AMBULATORY_CARE_PROVIDER_SITE_OTHER): Payer: Medicare Other | Admitting: Internal Medicine

## 2012-10-15 ENCOUNTER — Encounter: Payer: Self-pay | Admitting: Internal Medicine

## 2012-10-15 VITALS — BP 130/72 | HR 74 | Ht 66.0 in | Wt 159.0 lb

## 2012-10-15 DIAGNOSIS — J45901 Unspecified asthma with (acute) exacerbation: Secondary | ICD-10-CM

## 2012-10-15 DIAGNOSIS — J441 Chronic obstructive pulmonary disease with (acute) exacerbation: Secondary | ICD-10-CM

## 2012-10-15 DIAGNOSIS — C3490 Malignant neoplasm of unspecified part of unspecified bronchus or lung: Secondary | ICD-10-CM

## 2012-10-15 DIAGNOSIS — J449 Chronic obstructive pulmonary disease, unspecified: Secondary | ICD-10-CM

## 2012-10-15 MED ORDER — METHYLPREDNISOLONE ACETATE 80 MG/ML IJ SUSP
80.0000 mg | Freq: Once | INTRAMUSCULAR | Status: AC
  Administered 2012-10-15: 80 mg via INTRAMUSCULAR

## 2012-10-15 NOTE — Patient Instructions (Addendum)
Depo 80  We can continue O2 2L at rest, up to 3L if needed. Consider getting an oximeter at a drug store to check your own oxygen saturation. We would like to stay between 90 and 94% most of the time.  Consider trying a probiotic like Florastor, or Align yogurt, if you are going to be on antibiotics.   Please call as needed

## 2012-10-15 NOTE — Progress Notes (Signed)
Patient ID: [name], female    DOB: 1932/07/18, 79 y.o.   MRN: [id number]  HPI 816/12-79 year old female former smoker followed for history of right upper lobe cancer, COPD, allergic rhinitis  Here with husband Last here -August 05, 2010- note reviewed Increased  coughing 2 weeks, but struggling all summer. Better at beach. Here few days ago to see Dr Ronnald Ramp.   Had significantly productive cough- purulent, sometimes  with black spots. Uses nebulizer 2-3x/day, O2 2l/ [employer]. On Avelox now with Symbicort from Dr Ronnald Ramp. Mouth is getting sore. Still actively wheezing and short of breath.  CXR December 07, 2010- clear except old surgical changes.  She is not sure if she began this flare with cold or allergy. Low fever once or twice in past month. CT w/cm- 12/21/10- normal heart size coronary calcification. Prior right upper lobectomy. Stable right millimeter nodule at right minor fissure. No suspicious nodules. Centrilobular emphysema.  05/24/11- 79 year old female former smoker followed for history of right upper lobe cancer, COPD, allergic rhinitis. Here with husband Has had flu shot. She was feeling well until one week ago when she developed sore throat, temperature to 99.7, chills and malaise, cough productive of purulent blood-streaked sputum. Has home nebulizer being used only once daily with Xopenex. She denies swollen glands, fluid retention, GI upset or chest pain.  06/28/11- 79 year old female former smoker followed for history of right upper lobe cancer, COPD, allergic rhinitis. Here with husband Reports- wheezing, chest congestion, runny nose, cough with green/red mucus tinged with blood.  She got well after last visit and been sick again. Did have flu vaccine. Now 3 or 4 days of rhinorrhea, chills, productive cough with discolored sputum. 3 days ago had brief episode of pink in sputum with hard cough and. Cough and sputum production are less today. Occasional tussive soreness  at the xiphoid. Frequent heartburn. Doxycycline and seemed to help but is completed now. Does have home nebulizer.  Had chest x-ray January 7: *RADIOLOGY REPORT* 06/27/11- Clinical Data: 79 year old with hemoptysis.  CHEST - 2 VIEW  Comparison: Chest radiograph 05/24/2011  Findings: Chronic volume loss and postoperative changes in the  right hemithorax. The upper lungs are clear with increased  lucency. Findings suggest emphysematous changes. Stable linear  density at the right lung base is suggestive for scar. The heart  and mediastinum are stable. Slightly increased interstitial  densities in the right mid chest are similar to the prior  examination. No focal airspace disease.  IMPRESSION:  Stable chest radiograph findings. No acute changes.  Original Report Authenticated By: Markus Daft, M.D.   10/13/11- 79 year old female former smoker followed for history of right upper lobe cancer, COPD, allergic rhinitis. Here with husband Always has SOB, wheezing, cough, and congestion-has gotten worse due to pollen-eyes bothering her and runny nose(sneezing) Increased congestion in head and nose. She had had a pneumonia in December and then had fallen at home in January, bruising her face. Using nebulizer twice a day. Complains portable oxygen is too heavy and is thinking about changing to [facility]  02/16/12- 79 year old female former smoker followed for history of right upper lobe NSCCa, COPD, allergic rhinitis. Here with husband Sore throat and tongue-? white spots started 2-3 days ago; last night had cough-productive. Recent cortisone shot by orthopedist. COPD assessment test (CAT) score 13/24 CXR 01/06/12-reviewed with her IMPRESSION:  Stable emphysematous change and chronic postsurgical change of the  right lung without definite acute cardiopulmonary disease.  Original Report Authenticated By:  Waynard Reeds, M.D.   10/15/12- 69year-old female former smoker followed for history of  right upper lobe NSCCa, COPD, allergic rhinitis.  FOLLOWS ZOX:[id number] wheezing today; ? pollen causing it. Did well through the winter. Using oxygen 2 L/[facility]. Productive cough after being outdoors a lot yesterday. Clear sputum, no fever.  Review of Systems-see HPI Constitutional:   No-   weight loss, night sweats, , chills, fatigue, lassitude. HEENT:   No-  headaches, difficulty swallowing, tooth/dental problems, +sore throat,     No-  sneezing, itching, ear ache, +nasal congestion, post nasal drip,  CV:  No-chest pain, orthopnea, PND, swelling in lower extremities, anasarca, dizziness, palpitations Resp:    +shortness of breath with exertion or at rest.                 +productive cough,   non-productive cough,    no-coughing up of blood-.              No-   change in color of mucus.    Skin: No-   rash or lesions. GI:  No-   heartburn, indigestion, abdominal pain, nausea, vomiting,  GU:  MS:  +  joint pain or swelling.   Neuro- nothing unusual Psych:  No- change in mood or affect. No depression or anxiety.  No memory loss.     Objective:   Physical Exam BP 130/72  Pulse 74  Ht 5\' 6"  (1.676 m)  Wt 159 lb (72.122 kg)  BMI 25.68 kg/m2  SpO2 96%/ O2 2L General- Alert, Oriented, Affect-appropriate, Distress- none acute, +talkative Skin- rash-none, lesions- none, excoriation- none Lymphadenopathy- none Head- atraumatic            Eyes- Gross vision intact, PERRLA, conjunctivae clear secretions            Ears- Hearing, canals normal            Nose- Clear, no-Septal dev, mucus, polyps, erosion, perforation             Throat- Mallampati IV , mucosa clear , drainage- none, tonsils- atrophic; mild thrush Neck- flexible , trachea midline, no stridor , thyroid nl, carotid no bruit Chest - symmetrical excursion , unlabored           Heart/CV- RRR , no murmur , no gallop  , no rub, nl s1 s2                           - JVD- none , edema- none, stasis changes- none,  varices- none           Lung- + bilateral unlabored wheeze, talkative , dullness-none, rub- none           Chest wall-  Abd-  Br/ Gen/ Rectal- Not done, not indicated Extrem- cyanosis- none, clubbing, none, atrophy- none, strength- nl Neuro- grossly intact to observation

## 2012-10-16 ENCOUNTER — Telehealth: Payer: Self-pay

## 2012-10-16 MED ORDER — AMITRIPTYLINE HCL 25 MG PO TABS: 12.5000 mg | ORAL_TABLET | Freq: Every evening | ORAL | Status: DC | PRN

## 2012-10-16 NOTE — Telephone Encounter (Signed)
Stop trazodone Begin amitriptyline half to whole tablet at bedtime as needed, erx done

## 2012-10-16 NOTE — Telephone Encounter (Signed)
Pt called stating that she has taken the Trazodone 0.5 - 1 tab qhs but she has not been able to sleep for the last 3 nights because the medication has the opposite effect - it made her kept her awake. Pt is requesting MD change medication, please advise

## 2012-10-17 NOTE — Telephone Encounter (Signed)
Pt advised to D/C trazodone and start new medication.

## 2012-10-22 NOTE — Assessment & Plan Note (Signed)
Acute exacerbation consistent with being outdoors a lot in the pollen as described. Plan-she'll take a nebulizer treatment when she gets home. Discussed getting a home oximeter for saturation goal between 90 and 94%. May use oxygen 2-3 L. Depo-Medrol today. Oncology will schedule her followup imaging.

## 2012-10-22 NOTE — Assessment & Plan Note (Signed)
No recurrence identified so far.

## 2012-10-23 ENCOUNTER — Other Ambulatory Visit: Payer: Self-pay | Admitting: Internal Medicine

## 2012-10-23 ENCOUNTER — Telehealth: Payer: Self-pay | Admitting: *Deleted

## 2012-10-23 MED ORDER — ALPRAZOLAM 1 MG PO TABS: 1.0000 mg | ORAL_TABLET | Freq: Every evening | ORAL | Status: DC | PRN

## 2012-10-23 NOTE — Telephone Encounter (Signed)
Pt informed of MD's advisement regarding Xanax, rx faxed to [facility].

## 2012-10-23 NOTE — Telephone Encounter (Signed)
Noted side effects - amitriptyline removed from med list Yes, I will renew Xanax, but please remind patient to use half tablet nightly (or as needed) because her insurance will approve only 15 tablets per month - new rx done

## 2012-10-23 NOTE — Telephone Encounter (Signed)
Faxed script back to [facility].../lmb 

## 2012-10-23 NOTE — Telephone Encounter (Signed)
Pt states that Amitriptyline sent in last week to help her sleep caused severe dizziness and lightheadedness. Pt has stopped taking them. Pt wants to know if MD will approve Alprazolam refill to help her sleep.

## 2012-11-14 ENCOUNTER — Other Ambulatory Visit: Payer: Self-pay | Admitting: Internal Medicine

## 2012-11-16 ENCOUNTER — Telehealth: Payer: Self-pay | Admitting: Internal Medicine

## 2012-11-16 MED ORDER — BECLOMETHASONE DIPROPIONATE 80 MCG/ACT IN AERS: 2.0000 | INHALATION_SPRAY | Freq: Two times a day (BID) | RESPIRATORY_TRACT | Status: DC

## 2012-11-16 NOTE — Telephone Encounter (Signed)
Rx has been sent in. Pt is aware. Nothing further was needed. 

## 2012-11-22 ENCOUNTER — Other Ambulatory Visit: Payer: Self-pay | Admitting: Internal Medicine

## 2012-12-17 ENCOUNTER — Telehealth: Payer: Self-pay | Admitting: Internal Medicine

## 2012-12-17 MED ORDER — DOXYCYCLINE HYCLATE 100 MG PO TABS: ORAL_TABLET | ORAL | Status: DC

## 2012-12-17 NOTE — Telephone Encounter (Signed)
Pt aware of recs. RX sent to the pharmacy. Nothing further was needed

## 2012-12-17 NOTE — Telephone Encounter (Signed)
Per CY: Offer doxycycline 100mg  #8 2 today then 1 daily

## 2012-12-17 NOTE — Telephone Encounter (Signed)
I spoke with pt. She c/o scratchy throat, cough w/ yellow-green phlem x Sunday. Denies any wheezing, chest tx, SOB. She is drinking hot tea and using cough drops. She has tess pearles but has not taking anything today. Please advise Dr. Maple Hudson thanks Last 10/15/12 Pending 04/16/13 Allergies  Allergen Reactions  . Daliresp (Roflumilast) Nausea Only  . Tiotropium Bromide Monohydrate     Urinary retention  . Ampicillin     REACTION: GI upset  . Azithromycin     REACTION: diarrhea  . Codeine   . Fludrocortisone Acetate Itching  . Nitrofurantoin     REACTION: Upset GI  . Penicillins   . Sulfonamide Derivatives

## 2012-12-23 ENCOUNTER — Other Ambulatory Visit: Payer: Self-pay | Admitting: Internal Medicine

## 2012-12-31 ENCOUNTER — Telehealth: Payer: Self-pay | Admitting: Oncology

## 2012-12-31 ENCOUNTER — Ambulatory Visit (HOSPITAL_COMMUNITY)
Admission: RE | Admit: 2012-12-31 | Discharge: 2012-12-31 | Disposition: A | Discharge status: Home / Self Care | Payer: Medicare Other | Source: Ambulatory Visit | Attending: Oncology | Admitting: Oncology

## 2012-12-31 ENCOUNTER — Ambulatory Visit (HOSPITAL_BASED_OUTPATIENT_CLINIC_OR_DEPARTMENT_OTHER): Payer: Medicare Other

## 2012-12-31 DIAGNOSIS — C3410 Malignant neoplasm of upper lobe, unspecified bronchus or lung: Secondary | ICD-10-CM | POA: Insufficient documentation

## 2012-12-31 DIAGNOSIS — C3490 Malignant neoplasm of unspecified part of unspecified bronchus or lung: Secondary | ICD-10-CM

## 2012-12-31 NOTE — Telephone Encounter (Signed)
pt called to r/s lab due to husband having appt in [facility] today.Marland KitchenMarland KitchenDone

## 2013-01-01 ENCOUNTER — Other Ambulatory Visit: Payer: Medicare Other | Admitting: Lab

## 2013-01-01 ENCOUNTER — Ambulatory Visit (HOSPITAL_COMMUNITY): Payer: Medicare Other

## 2013-01-04 ENCOUNTER — Telehealth: Payer: Self-pay | Admitting: Internal Medicine

## 2013-01-04 MED ORDER — DOXYCYCLINE HYCLATE 100 MG PO TABS: ORAL_TABLET | ORAL | Status: DC

## 2013-01-04 NOTE — Telephone Encounter (Signed)
Per CDY:  doxycyline 100 mg # 8 take 2 today then 1 daily  -------  Called, spoke with pt.  Informed her of above recs per Dr. Maple Hudson.  She verbalized understanding of this.  She is aware rx sent to [facility] and is to call back if symptoms do not improve or worsen.

## 2013-01-04 NOTE — Telephone Encounter (Signed)
Spoke to pt. Reports cough with production of yellow mucus and a scratchy throat x 3 days. Denies fever/chills. Requests that something be called in her for.  Allergies  Allergen Reactions  . Daliresp (Roflumilast) Nausea Only  . Tiotropium Bromide Monohydrate     Urinary retention  . Ampicillin     REACTION: GI upset  . Azithromycin     REACTION: diarrhea  . Codeine   . Fludrocortisone Acetate Itching  . Nitrofurantoin     REACTION: Upset GI  . Penicillins   . Sulfonamide Derivatives    Last OV 10/15/2012 Pending OV 04/16/2013  CY - please advise. Thanks.

## 2013-01-08 ENCOUNTER — Ambulatory Visit (HOSPITAL_BASED_OUTPATIENT_CLINIC_OR_DEPARTMENT_OTHER): Payer: Medicare Other | Admitting: Oncology

## 2013-01-08 ENCOUNTER — Telehealth: Payer: Self-pay | Admitting: Oncology

## 2013-01-08 VITALS — BP 116/54 | HR 78 | Temp 97.0°F | Resp 20 | Ht 66.0 in | Wt 155.3 lb

## 2013-01-08 DIAGNOSIS — C3490 Malignant neoplasm of unspecified part of unspecified bronchus or lung: Secondary | ICD-10-CM

## 2013-01-08 DIAGNOSIS — C3491 Malignant neoplasm of unspecified part of right bronchus or lung: Secondary | ICD-10-CM

## 2013-01-08 NOTE — Telephone Encounter (Signed)
gv and printed appt sched and avs for pt  °

## 2013-01-11 NOTE — Progress Notes (Signed)
Hematology and Oncology Follow Up Visit  ARNETTA ODEH [id number] 10/31/1932 79 y.o. 01/11/2013 6:52 PM   Principle Diagnosis: Encounter Diagnosis  Name Primary?  . 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 She is followed with annual exams and chest radiographs and has had no signs of recurrence now out over 9 years.  She has oxygen-dependent obstructive airway disease with frequent bouts of bronchitis. She has frequent visits to her primary care physicians and her pulmonologist for exacerbations of her COPD.     Medications: reviewed  Allergies:  Allergies  Allergen Reactions  . Daliresp (Roflumilast) Nausea Only  . Tiotropium Bromide Monohydrate     Urinary retention  . Ampicillin     REACTION: GI upset  . Azithromycin     REACTION: diarrhea  . Codeine   . Fludrocortisone Acetate Itching  . Nitrofurantoin     REACTION: Upset GI  . Penicillins   . Sulfonamide Derivatives     Review of Systems: Constitutional:   Chronic fatigue Respiratory: Dyspnea on minimal exertion. Intermittent chronic cough Cardiovascular:  No chest pain or palpitations Gastrointestinal: No abdominal pain or change in bowel habit. Genito-Urinary: No urinary tract symptoms. No vaginal bleeding. Musculoskeletal: No significant musculoskeletal complaints Neurologic: No headache or change in vision Skin: No rash or ecchymosis Remaining ROS negative.  Physical Exam: Blood pressure 116/54, pulse 78, temperature 97 F (36.1 C), temperature source Oral, resp. rate 20, height 5\' 6"  (1.676 m), weight 155 lb 4.8 oz (70.444 kg). Wt Readings from Last 3 Encounters:  01/08/13 155 lb 4.8 oz (70.444 kg)  10/15/12 159 lb (72.122 kg)  10/10/12 156 lb (70.761 kg)     General appearance: Talkative  Caucasian woman HENNT: Pharynx no erythema exudate or mass Lymph nodes: No lymphadenopathy Breasts: Lungs: Clear to auscultation resonant to percussion Heart: Regular rhythm no murmur Abdomen: Soft, nontender, no mass, no organomegaly Extremities: No edema, no calf tenderness Musculoskeletal: No joint deformities GU: Vascular: No cyanosis Neurologic: Motor strength 5 over 5, reflexes 1+ symmetric Skin: No rash or ecchymosis  Lab Results: Lab Results  Component Value Date   WBC 8.5 12/31/2012   HGB 12.6 12/31/2012   HCT 37.2 12/31/2012   MCV 90.9 12/31/2012   PLT 318 12/31/2012     Chemistry      Component Value Date/Time   NA 138 12/31/2012 0957   NA 130* 04/06/2012 1100   NA 143 12/21/2010 1206   K 4.1 12/31/2012 0957   K 3.5 04/06/2012 1100   K 4.0 12/21/2010 1206   CL 91* 04/06/2012 1100   CL 97* 12/21/2010 1206   CO2 29 12/31/2012 0957   CO2 29 04/06/2012 1100   CO2 29 12/21/2010 1206   BUN 8.3 12/31/2012 0957   BUN 4* 04/06/2012 1100   BUN 10 12/21/2010 1206   CREATININE 0.8 12/31/2012 0957   CREATININE 0.72 04/06/2012 1100   CREATININE 0.9 12/21/2010 1206      Component Value Date/Time   CALCIUM 10.0 12/31/2012 0957   CALCIUM 9.6 04/06/2012 1100   CALCIUM 9.4 12/21/2010 1206   CALCIUM 9.9 06/29/2010 1628   ALKPHOS 119 12/31/2012 0957   ALKPHOS 92 04/06/2012 1100   ALKPHOS 74 12/21/2010 1206   AST 25 12/31/2012 0957   AST 31 04/06/2012 1100   AST  34 12/21/2010 1206   ALT 22 12/31/2012 0957   ALT 18 04/06/2012 1100   ALT 22 12/21/2010 1206   BILITOT 0.36 12/31/2012 0957   BILITOT 0.7 04/06/2012 1100   BILITOT 0.70 12/21/2010 1206       Radiological Studies: Dg Chest 2 View  12/31/2012   *RADIOLOGY REPORT*  Clinical Data: History of lung cancer.  CHEST - 2 VIEW  Comparison: Two-view chest 01/06/2012.  Findings: The heart size is normal.  Volume loss and scarring at the right lung base is stable.  Emphysematous changes are again noted.  No focal airspace disease evident.  There is no  edema or new effusion to suggest failure.  IMPRESSION:  1.  Postoperative changes at the right lung base. 2.  No acute cardiopulmonary disease or evidence for recurrent disease.   Original Report Authenticated By: Marin Roberts, M.D.    Impression: #1. Stage I well differentiated squamous cell carcinoma right upper lung. She remains free of any obvious recurrence now out over 9 years following surgical resection . Current chest x-ray stable. I will see her again in one year. She will graduate from our practice at that time.  #2. Oxygen-dependent obstructive airway disease   #3. Hypothyroid on replacement   #4. History of a benign ovarian cyst status post previous aspiration procedure.   #5. Question lumbar radiculopathy   Plan:   CC:[name]    Levert Feinstein, MD 7/25/20146:52 PM

## 2013-01-19 IMAGING — CR DG CHEST 2V
2 series · 2 of 2 positions shown · non-contrast
Comparison: Chest radiograph 05/24/2011

CLINICAL DATA: 78-year-old with hemoptysis.

CHEST - 2 VIEW

[view not recorded (1 of 2)]
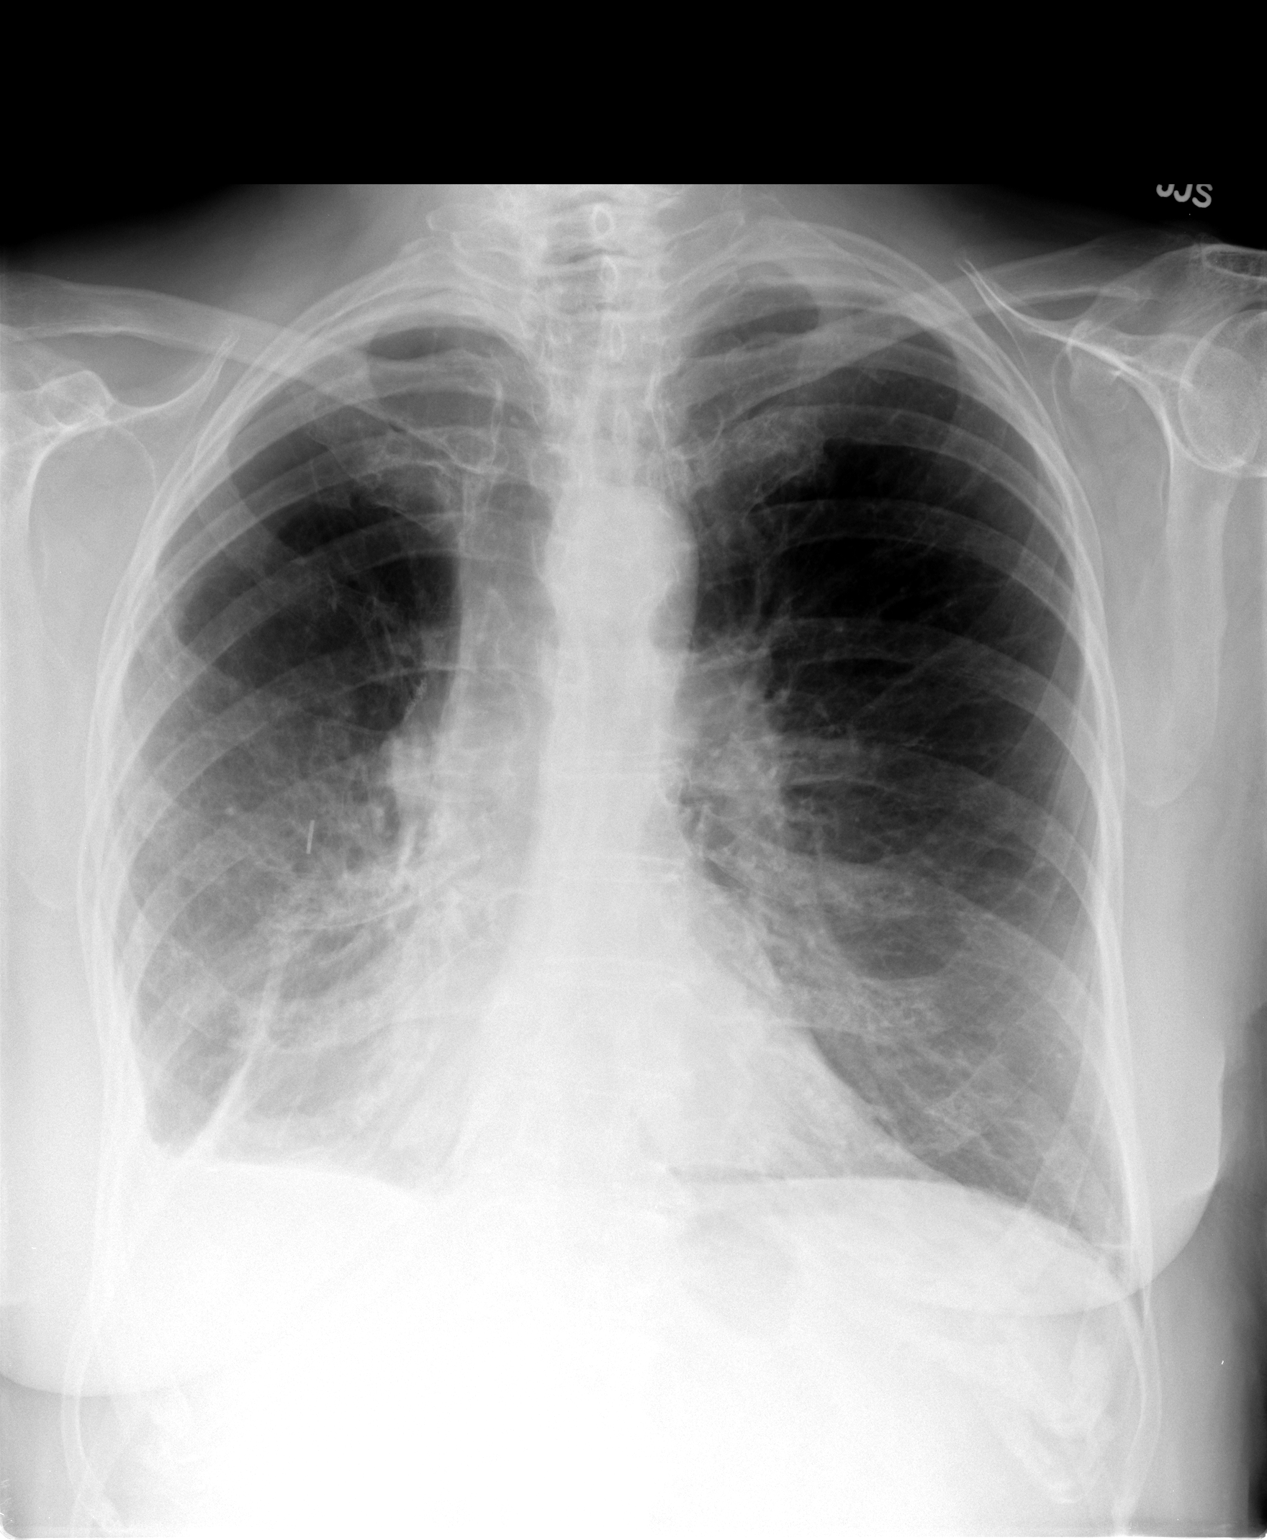

[view not recorded (2 of 2)]
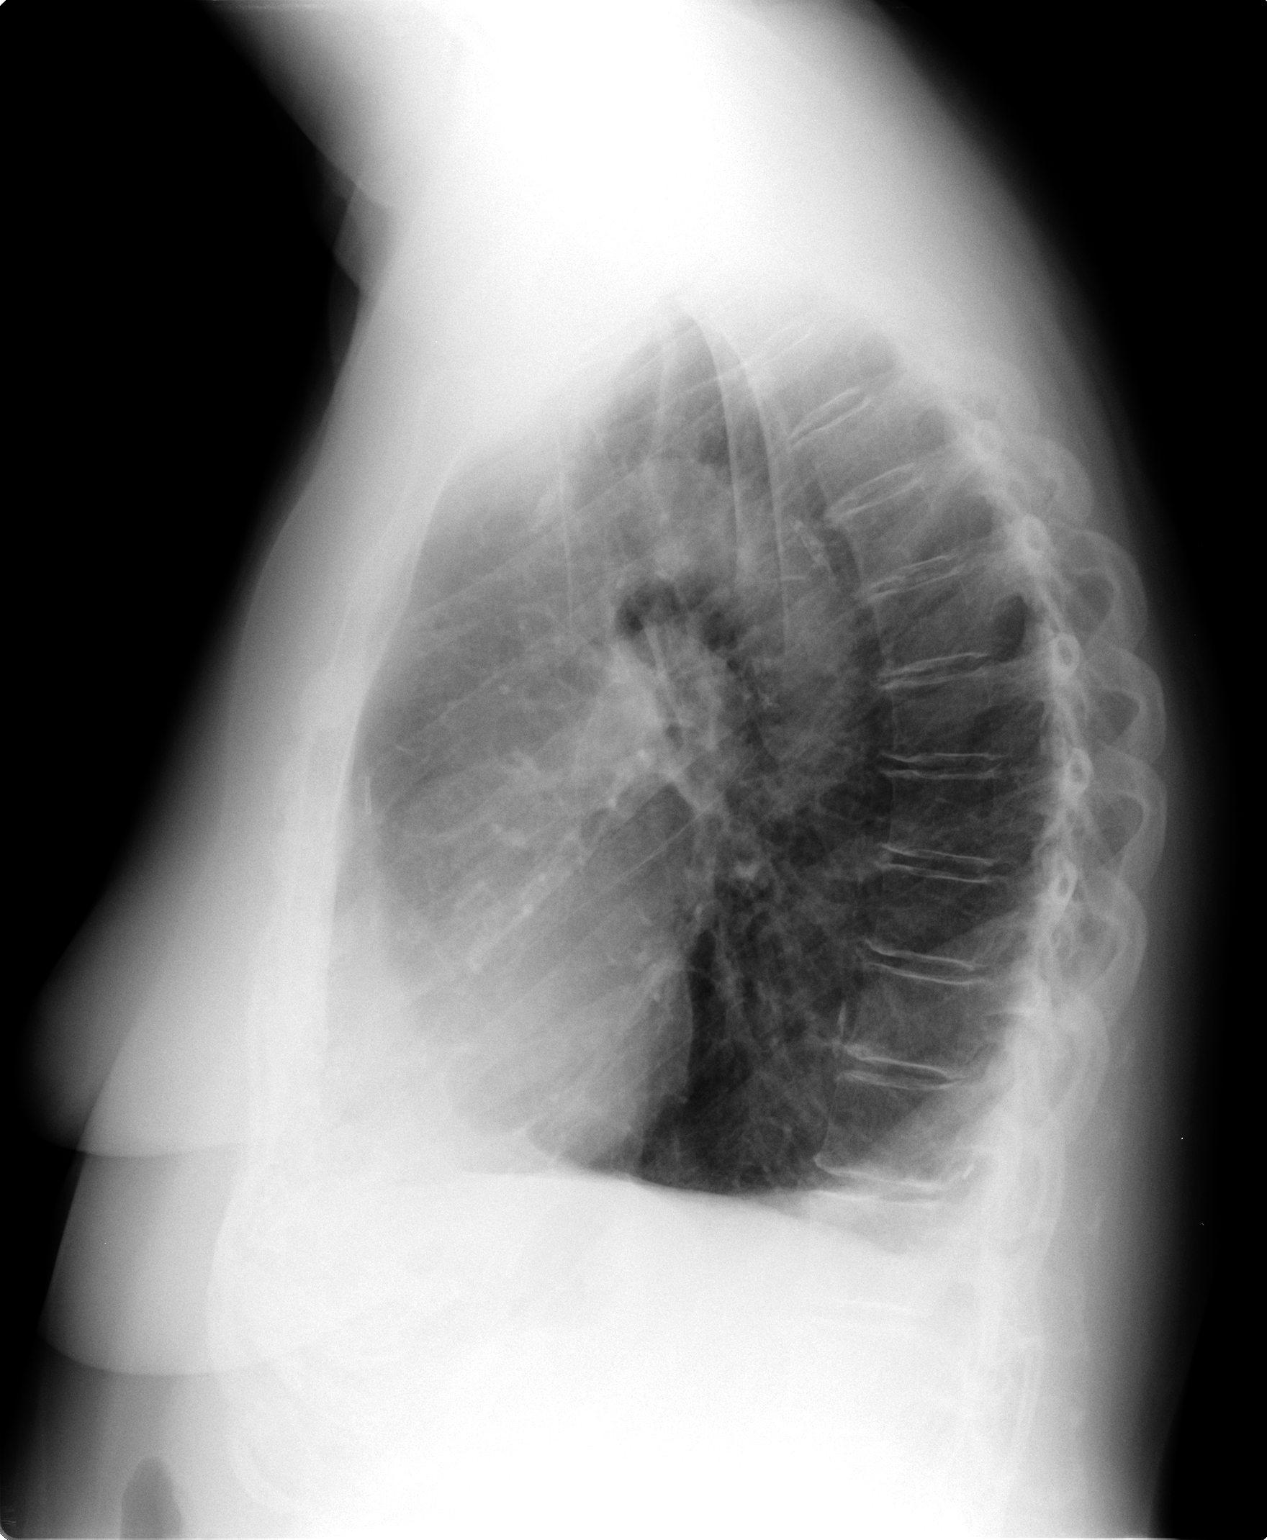

[2 of 2 positions shown; findings below may reference images not displayed]

FINDINGS: Chronic volume loss and postoperative changes in the
right hemithorax.  The upper lungs are clear with increased
lucency.  Findings suggest emphysematous changes.  Stable linear
density at the right lung base is suggestive for scar.  The heart
and mediastinum are stable. Slightly increased interstitial
densities in the right mid chest are similar to the prior
examination.  No focal airspace disease.
IMPRESSION: Stable chest radiograph findings.  No acute changes.

## 2013-01-22 ENCOUNTER — Other Ambulatory Visit: Payer: Self-pay | Admitting: Internal Medicine

## 2013-01-22 IMAGING — CT CT HEAD W/O CM
1 of 2 series · 16 of 30 positions shown, 20 images · non-contrast
Comparison: 09/26/2003.

CLINICAL DATA: Fall with syncope.  Headache and dizziness.
Evaluate for subdural hematoma. History squamous cell carcinoma of
the lung.

CT HEAD WITHOUT CONTRAST
TECHNIQUE: Contiguous axial images were obtained from the base of
the skull through the vertex without contrast.

[Series 2: head_seq -c 4.5 h37s st · axial · 0.43mm/px · z∈[-101,+25]mm · 16 of 32 slices shown, 20 images]
[im 2/32  brain]
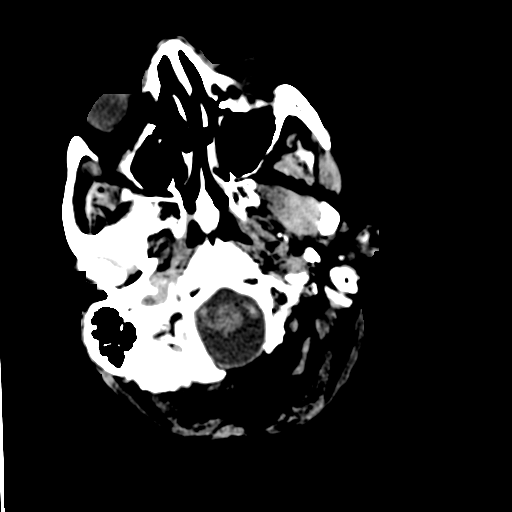
[im 2/32  bone]
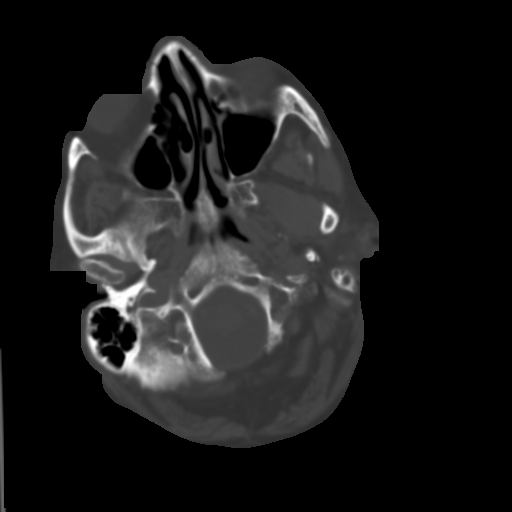
[im 5/32  brain]
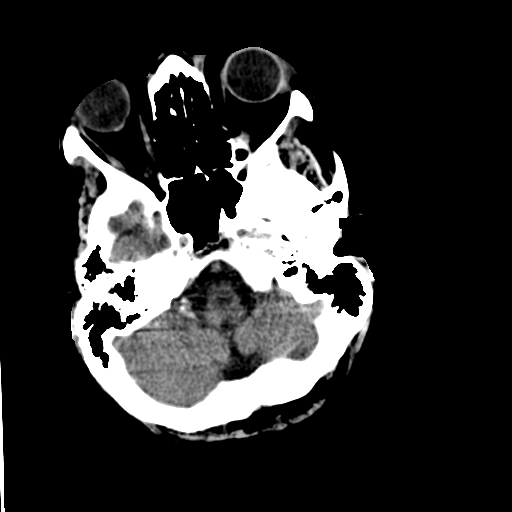
[im 6/32  brain]
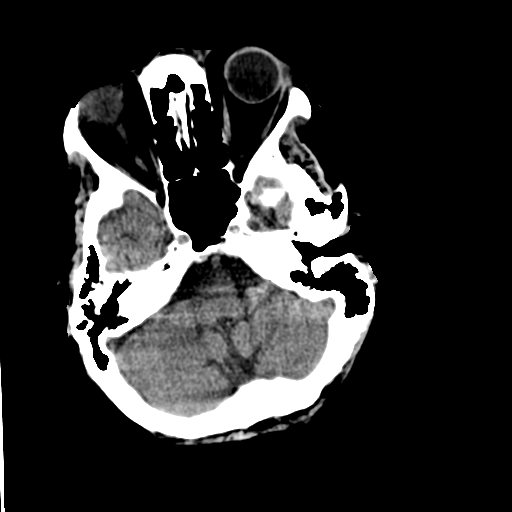
[im 7/32  brain]
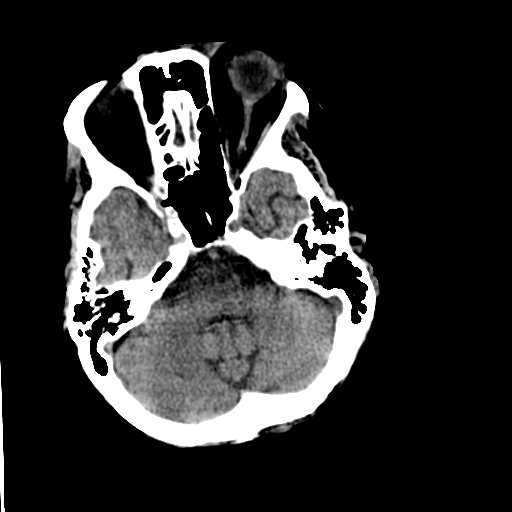
[im 10/32  brain]
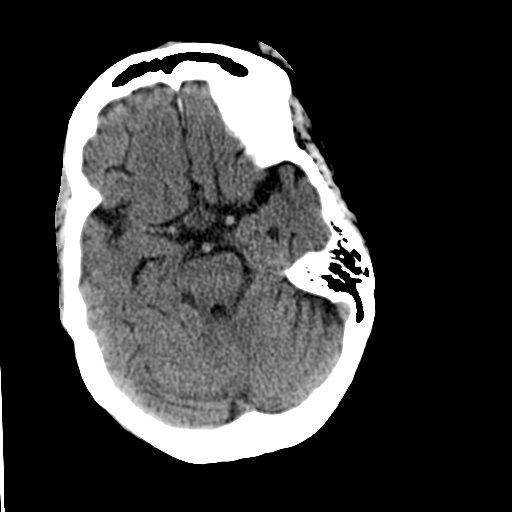
[im 10/32  bone]
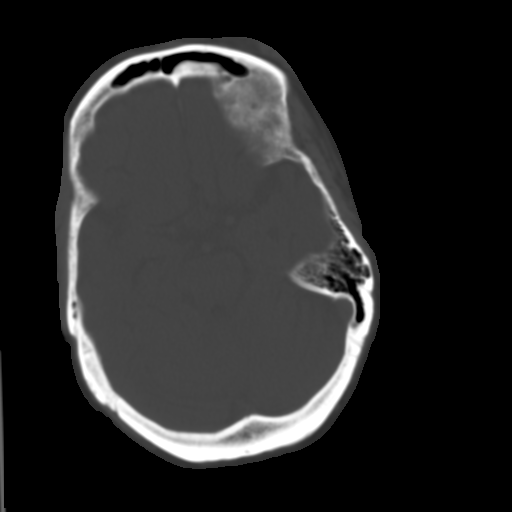
[im 11/32  brain]
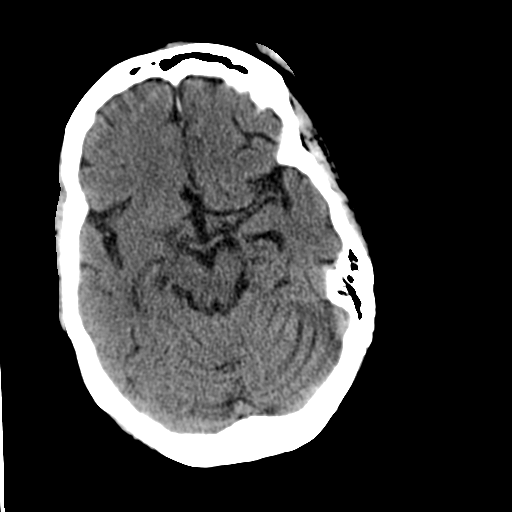
[im 13/32  brain]
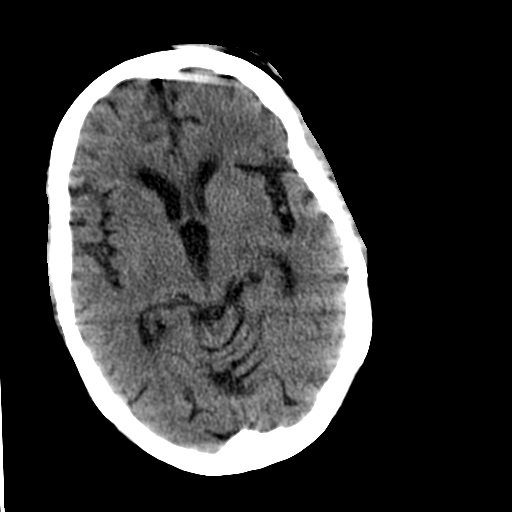
[im 15/32  brain]
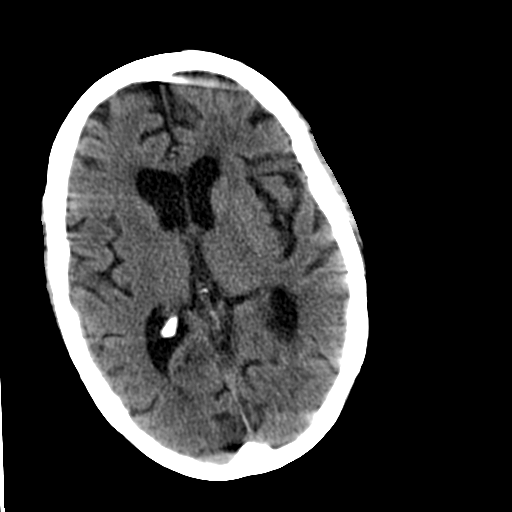
[im 17/32  brain]
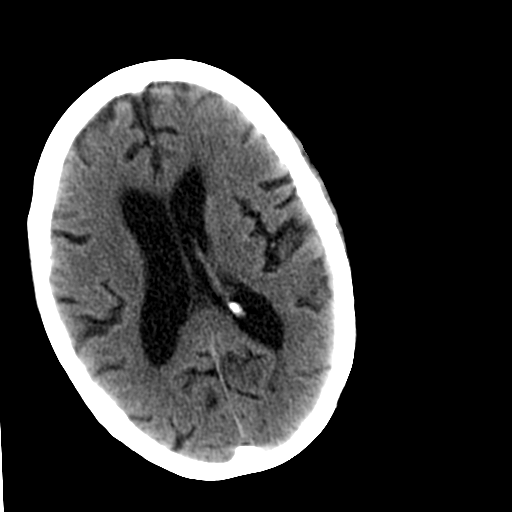
[im 17/32  bone]
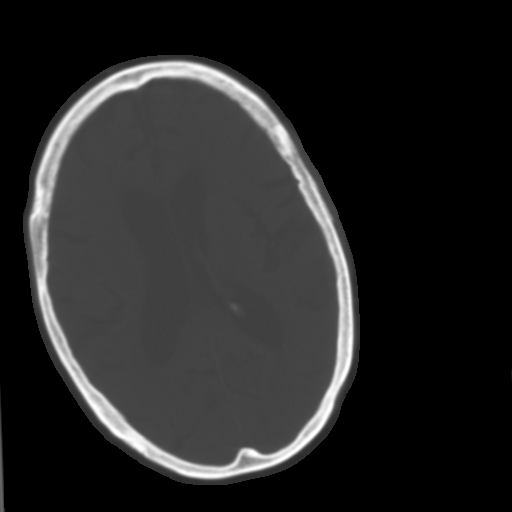
[im 19/32  brain]
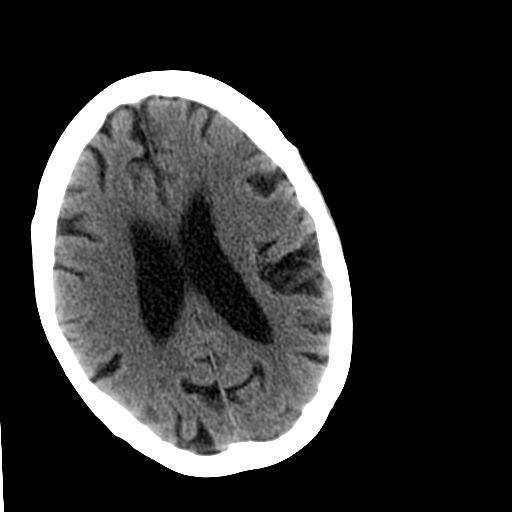
[im 21/32  brain]
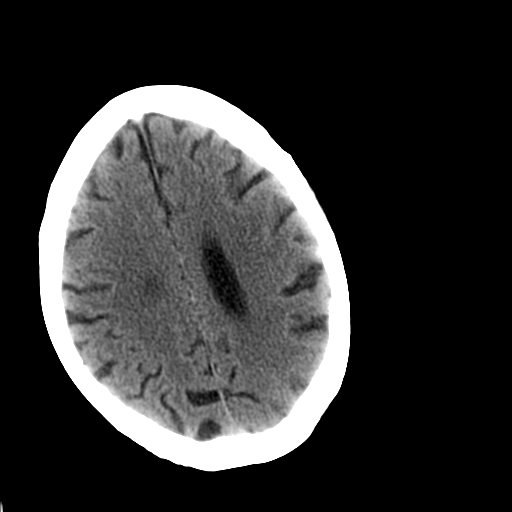
[im 22/32  brain]
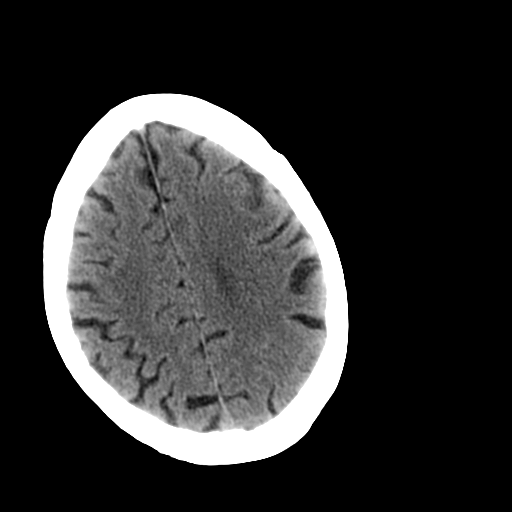
[im 25/32  brain]
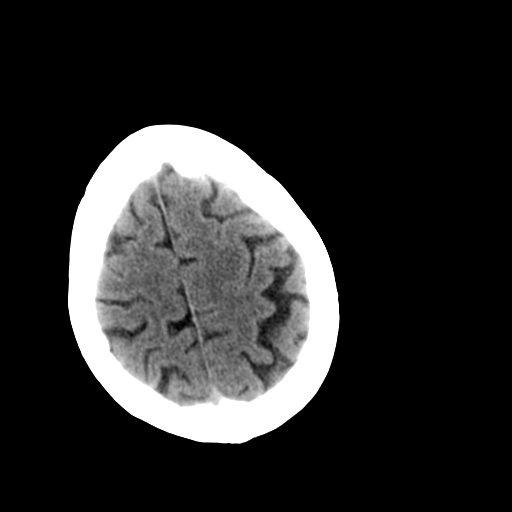
[im 25/32  bone]
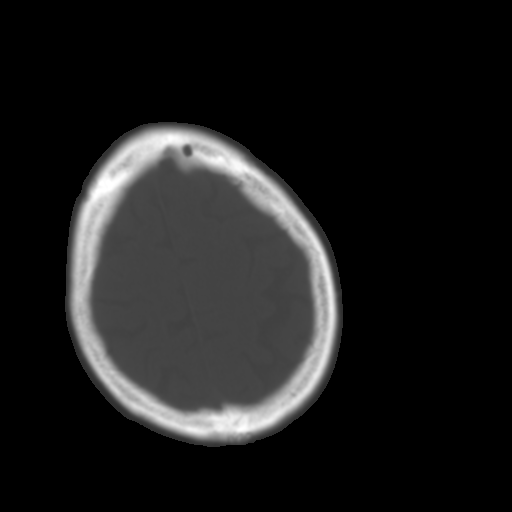
[im 26/32  brain]
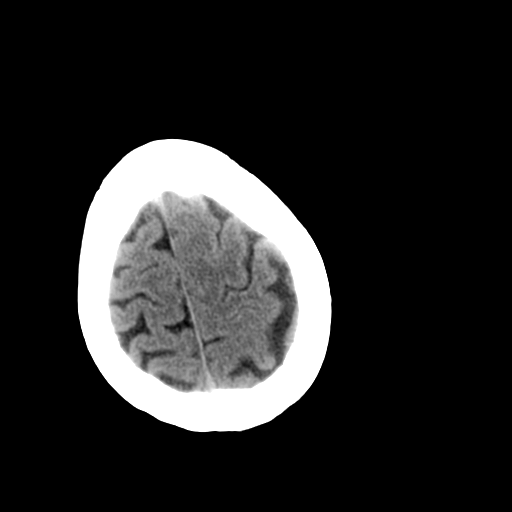
[im 27/32  brain]
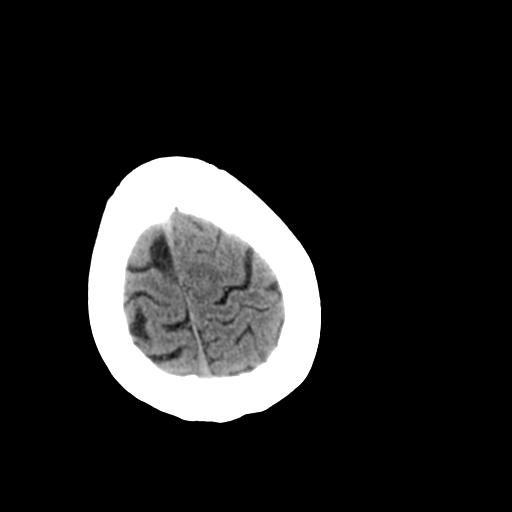
[im 30/32  brain]
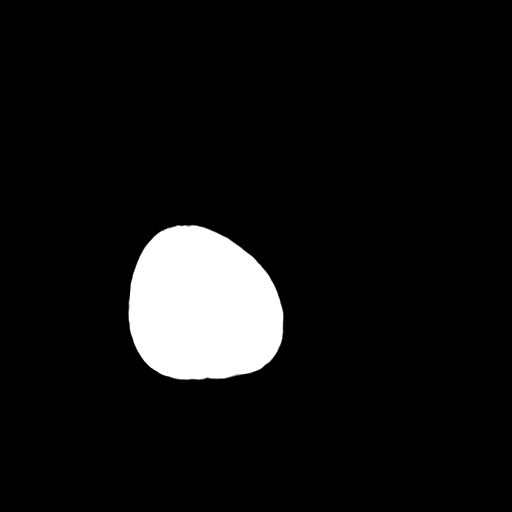

[16 of 30 positions shown; findings below may reference images not displayed]

FINDINGS: Bone windows demonstrate moderate left supraorbital and
frontal scalp soft tissue swelling.  Mild motion artifact on the
initial images, which were repeated.
No underlying skull fracture. Clear mastoid air cells.

Soft tissue windows demonstrate expected cerebral atrophy. No  mass
lesion, hemorrhage, hydrocephalus, acute infarct, intra-axial, or
extra-axial fluid collection.
IMPRESSION: 1.  Left-sided supraorbital/frontal soft tissue swelling, without
acute intracranial abnormality.
2.  Minimal motion degraded exam.

## 2013-02-22 ENCOUNTER — Other Ambulatory Visit: Payer: Self-pay | Admitting: Internal Medicine

## 2013-03-01 ENCOUNTER — Telehealth: Payer: Self-pay | Admitting: Internal Medicine

## 2013-03-01 MED ORDER — DOXYCYCLINE HYCLATE 100 MG PO TABS: ORAL_TABLET | ORAL | Status: DC

## 2013-03-01 NOTE — Telephone Encounter (Signed)
Spoke with the pt  She is c/o sore throat, prod cough with moderate green sputum, increased SOB and chest tightness x 2 days No f/c/s or other co's  Please advise, thanks! Last ov 10/15/12 Next ov 04/16/13  Allergies  Allergen Reactions  . Daliresp [Roflumilast] Nausea Only  . Tiotropium Bromide Monohydrate     Urinary retention  . Ampicillin     REACTION: GI upset  . Azithromycin     REACTION: diarrhea  . Codeine   . Fludrocortisone Acetate Itching  . Nitrofurantoin     REACTION: Upset GI  . Penicillins   . Sulfonamide Derivatives

## 2013-03-01 NOTE — Telephone Encounter (Signed)
Per CY-okay to give Doxycycline 100 mg #8 take 2 today then 1 daily until gone no refills.  

## 2013-03-01 NOTE — Telephone Encounter (Signed)
Called and spoke with pt and she is aware of the doxycycline that has been sent to the pharmacy.  Pt stated that if she is not feeling better by Monday she will call back.  Nothing further is needed.

## 2013-03-04 ENCOUNTER — Ambulatory Visit (INDEPENDENT_AMBULATORY_CARE_PROVIDER_SITE_OTHER): Payer: Medicare Other | Admitting: Internal Medicine

## 2013-03-04 ENCOUNTER — Telehealth: Payer: Self-pay | Admitting: Internal Medicine

## 2013-03-04 ENCOUNTER — Encounter: Payer: Self-pay | Admitting: Internal Medicine

## 2013-03-04 VITALS — BP 122/70 | HR 77 | Ht 66.0 in | Wt 155.6 lb

## 2013-03-04 DIAGNOSIS — J209 Acute bronchitis, unspecified: Secondary | ICD-10-CM

## 2013-03-04 DIAGNOSIS — J441 Chronic obstructive pulmonary disease with (acute) exacerbation: Secondary | ICD-10-CM

## 2013-03-04 MED ORDER — LEVALBUTEROL HCL 0.63 MG/3ML IN NEBU: 0.6300 mg | INHALATION_SOLUTION | Freq: Four times a day (QID) | RESPIRATORY_TRACT | Status: DC

## 2013-03-04 MED ORDER — METHYLPREDNISOLONE ACETATE 80 MG/ML IJ SUSP
80.0000 mg | Freq: Once | INTRAMUSCULAR | Status: AC
  Administered 2013-03-04: 80 mg via INTRAMUSCULAR

## 2013-03-04 MED ORDER — AZITHROMYCIN 250 MG PO TABS: ORAL_TABLET | ORAL | Status: DC

## 2013-03-04 MED ORDER — LEVALBUTEROL HCL 0.63 MG/3ML IN NEBU
0.6300 mg | INHALATION_SOLUTION | Freq: Once | RESPIRATORY_TRACT | Status: AC
  Administered 2013-03-04: 0.63 mg via RESPIRATORY_TRACT

## 2013-03-04 NOTE — Progress Notes (Signed)
Patient ID: [name], female    DOB: 1932/07/18, 79 y.o.   MRN: [id number]  HPI 816/12-79 year old female former smoker followed for history of right upper lobe cancer, COPD, allergic rhinitis  Here with husband Last here -August 05, 2010- note reviewed Increased  coughing 2 weeks, but struggling all summer. Better at beach. Here few days ago to see Dr Ronnald Ramp.   Had significantly productive cough- purulent, sometimes  with black spots. Uses nebulizer 2-3x/day, O2 2l/ [employer]. On Avelox now with Symbicort from Dr Ronnald Ramp. Mouth is getting sore. Still actively wheezing and short of breath.  CXR December 07, 2010- clear except old surgical changes.  She is not sure if she began this flare with cold or allergy. Low fever once or twice in past month. CT w/cm- 12/21/10- normal heart size coronary calcification. Prior right upper lobectomy. Stable right millimeter nodule at right minor fissure. No suspicious nodules. Centrilobular emphysema.  05/24/11- 79 year old female former smoker followed for history of right upper lobe cancer, COPD, allergic rhinitis. Here with husband Has had flu shot. She was feeling well until one week ago when she developed sore throat, temperature to 99.7, chills and malaise, cough productive of purulent blood-streaked sputum. Has home nebulizer being used only once daily with Xopenex. She denies swollen glands, fluid retention, GI upset or chest pain.  06/28/11- 79 year old female former smoker followed for history of right upper lobe cancer, COPD, allergic rhinitis. Here with husband Reports- wheezing, chest congestion, runny nose, cough with green/red mucus tinged with blood.  She got well after last visit and been sick again. Did have flu vaccine. Now 3 or 4 days of rhinorrhea, chills, productive cough with discolored sputum. 3 days ago had brief episode of pink in sputum with hard cough and. Cough and sputum production are less today. Occasional tussive soreness  at the xiphoid. Frequent heartburn. Doxycycline and seemed to help but is completed now. Does have home nebulizer.  Had chest x-ray January 7: *RADIOLOGY REPORT* 06/27/11- Clinical Data: 79 year old with hemoptysis.  CHEST - 2 VIEW  Comparison: Chest radiograph 05/24/2011  Findings: Chronic volume loss and postoperative changes in the  right hemithorax. The upper lungs are clear with increased  lucency. Findings suggest emphysematous changes. Stable linear  density at the right lung base is suggestive for scar. The heart  and mediastinum are stable. Slightly increased interstitial  densities in the right mid chest are similar to the prior  examination. No focal airspace disease.  IMPRESSION:  Stable chest radiograph findings. No acute changes.  Original Report Authenticated By: Markus Daft, M.D.   10/13/11- 79 year old female former smoker followed for history of right upper lobe cancer, COPD, allergic rhinitis. Here with husband Always has SOB, wheezing, cough, and congestion-has gotten worse due to pollen-eyes bothering her and runny nose(sneezing) Increased congestion in head and nose. She had had a pneumonia in December and then had fallen at home in January, bruising her face. Using nebulizer twice a day. Complains portable oxygen is too heavy and is thinking about changing to [facility]  02/16/12- 79 year old female former smoker followed for history of right upper lobe NSCCa, COPD, allergic rhinitis. Here with husband Sore throat and tongue-? white spots started 2-3 days ago; last night had cough-productive. Recent cortisone shot by orthopedist. COPD assessment test (CAT) score 13/24 CXR 01/06/12-reviewed with her IMPRESSION:  Stable emphysematous change and chronic postsurgical change of the  right lung without definite acute cardiopulmonary disease.  Original Report Authenticated By:  Waynard Reeds, M.D.   10/15/12- 54year-old female former smoker followed for history of  right upper lobe NSCCa, COPD, allergic rhinitis.  FOLLOWS ZOX:[id number] wheezing today; ? pollen causing it. Did well through the winter. Using oxygen 2 L/[facility]. Productive cough after being outdoors a lot yesterday. Clear sputum, no fever.  03/04/13- 21year-old female former smoker followed for history of right upper lobe NSCCa, COPD, allergic rhinitis FOLLOWS FOR:  Increased SOB and coughing w/ yellowish mucus since 9/11 Trying to help husband who has NHL. She reports one-week increased cough, green sputum. We sent doxycycline-sputum turning yellow. No fever or GI upset. Nasal congestion without headache. CXR 12/31/12 IMPRESSION:  1. Postoperative changes at the right lung base.  2. No acute cardiopulmonary disease or evidence for recurrent  disease.  Original Report Authenticated By: Marin Roberts, M.D.  Review of Systems-see HPI Constitutional:   No-   weight loss, night sweats, , chills, fatigue, lassitude. HEENT:   No-  headaches, difficulty swallowing, tooth/dental problems, +sore throat,     No-  sneezing, itching, ear ache, +nasal congestion, post nasal drip,  CV:  No-chest pain, orthopnea, PND, swelling in lower extremities, anasarca, dizziness, palpitations Resp:    +shortness of breath with exertion or at rest.                 +productive cough,   non-productive cough,    no-coughing up of blood-.             + change in color of mucus.    Skin: No-   rash or lesions. GI:  No-   heartburn, indigestion, abdominal pain, nausea, vomiting,  GU:  MS:  +  joint pain or swelling.   Neuro- nothing unusual Psych:  No- change in mood or affect. No depression or anxiety.  No memory loss.     Objective:   Physical Exam General- Alert, Oriented, Affect-appropriate, Distress- none acute, +talkative Skin- rash-none, lesions- none, excoriation- none Lymphadenopathy- none Head- atraumatic            Eyes- Gross vision intact, PERRLA, conjunctivae clear secretions             Ears- Hearing, canals normal            Nose- Clear, no-Septal dev, mucus, polyps, erosion, perforation             Throat- Mallampati III-IV , mucosa clear , drainage- none, tonsils- atrophic; mild thrush Neck- flexible , trachea midline, no stridor , thyroid nl, carotid no bruit Chest - symmetrical excursion , unlabored           Heart/CV- RRR , no murmur , no gallop  , no rub, nl s1 s2                           - JVD- none , edema- none, stasis changes- none, varices- none           Lung- + bilateral unlabored wheeze, cough-+, talkative , dullness-none, rub- none           Chest wall-  Abd-  Br/ Gen/ Rectal- Not done, not indicated Extrem- cyanosis- none, clubbing, none, atrophy- none, strength- nl Neuro- grossly intact to observation

## 2013-03-04 NOTE — Telephone Encounter (Signed)
i spoke with pt. She is coming in at 3:30 today to see CDY.

## 2013-03-04 NOTE — Patient Instructions (Addendum)
Neb xop 0.63  Depo 80  Script for Zpak sent. Ok also to finish the doxycycline

## 2013-03-21 ENCOUNTER — Other Ambulatory Visit: Payer: Self-pay | Admitting: Internal Medicine

## 2013-04-10 ENCOUNTER — Other Ambulatory Visit (INDEPENDENT_AMBULATORY_CARE_PROVIDER_SITE_OTHER): Payer: Medicare Other

## 2013-04-10 ENCOUNTER — Encounter: Payer: Self-pay | Admitting: Internal Medicine

## 2013-04-10 ENCOUNTER — Ambulatory Visit (INDEPENDENT_AMBULATORY_CARE_PROVIDER_SITE_OTHER): Payer: Medicare Other | Admitting: Internal Medicine

## 2013-04-10 VITALS — BP 128/70 | HR 70 | Temp 97.6°F | Wt 151.8 lb

## 2013-04-10 DIAGNOSIS — E039 Hypothyroidism, unspecified: Secondary | ICD-10-CM

## 2013-04-10 DIAGNOSIS — E785 Hyperlipidemia, unspecified: Secondary | ICD-10-CM

## 2013-04-10 DIAGNOSIS — Z23 Encounter for immunization: Secondary | ICD-10-CM

## 2013-04-10 DIAGNOSIS — F411 Generalized anxiety disorder: Secondary | ICD-10-CM

## 2013-04-10 DIAGNOSIS — Z0000 Encounter for general adult medical examination without abnormal findings: Secondary | ICD-10-CM

## 2013-04-10 LAB — LIPID PANEL
Cholesterol: 234 mg/dL — ABNORMAL HIGH (ref 0–200)
HDL: 70.4 mg/dL (ref >39.00)
Total CHOL/HDL Ratio: 3
Triglycerides: 73 mg/dL (ref 0.0–149.0)
VLDL: 14.6 mg/dL (ref 0.0–40.0)

## 2013-04-10 LAB — CBC WITH DIFFERENTIAL/PLATELET
Basophils Relative: 0.3 % (ref 0.0–3.0)
Eosinophils Absolute: 0 10*3/uL (ref 0.0–0.7)
Lymphocytes Relative: 26.8 % (ref 12.0–46.0)
Lymphs Abs: 2.1 10*3/uL (ref 0.7–4.0)
MCHC: 33.4 g/dL (ref 30.0–36.0)
MCV: 89.7 fl (ref 78.0–100.0)
Monocytes Absolute: 0.7 10*3/uL (ref 0.1–1.0)
Monocytes Relative: 8.5 % (ref 3.0–12.0)
Neutrophils Relative %: 64 % (ref 43.0–77.0)
Platelets: 275 10*3/uL (ref 150.0–400.0)
RBC: 4.42 Mil/uL (ref 3.87–5.11)
WBC: 7.9 10*3/uL (ref 4.5–10.5)

## 2013-04-10 LAB — BASIC METABOLIC PANEL
BUN: 9 mg/dL (ref 6–23)
CO2: 30 mEq/L (ref 19–32)
Calcium: 9.8 mg/dL (ref 8.4–10.5)
Creatinine, Ser: 0.7 mg/dL (ref 0.4–1.2)
Sodium: 133 mEq/L — ABNORMAL LOW (ref 135–145)

## 2013-04-10 LAB — LDL CHOLESTEROL, DIRECT: Direct LDL: 152.8 mg/dL

## 2013-04-10 MED ORDER — LEVOTHYROXINE SODIUM 100 MCG PO TABS: 100.0000 ug | ORAL_TABLET | Freq: Every day | ORAL | Status: DC

## 2013-04-10 MED ORDER — ALPRAZOLAM 1 MG PO TABS: 1.0000 mg | ORAL_TABLET | Freq: Every evening | ORAL | Status: DC | PRN

## 2013-04-10 MED ORDER — ATORVASTATIN CALCIUM 40 MG PO TABS: 40.0000 mg | ORAL_TABLET | Freq: Every day | ORAL | Status: DC

## 2013-04-10 MED ORDER — BECLOMETHASONE DIPROPIONATE 80 MCG/ACT IN AERS: 2.0000 | INHALATION_SPRAY | Freq: Two times a day (BID) | RESPIRATORY_TRACT | Status: DC

## 2013-04-10 NOTE — Assessment & Plan Note (Signed)
Chronic symptoms, exacerbated by dx lung ca in spouse 10/2012, now ongoing chemo associated with insomnia - chronic BZ use for same, but insurance limits #/mo Has stopped EtOH use (but still drinks wine) Intol of traz and amitiptyline trials spring 2014 

## 2013-04-10 NOTE — Progress Notes (Signed)
Pre-visit discussion using our clinic review tool. No additional management support is needed unless otherwise documented below in the visit note.  

## 2013-04-10 NOTE — Assessment & Plan Note (Signed)
Lab Results  Component Value Date   TSH 2.62 02/22/2012  check TSH annually and prn The current medical regimen is effective;  continue present plan and medications.

## 2013-04-10 NOTE — Progress Notes (Signed)
Subjective:    Patient ID: [name], female    DOB: Sep 12, 1932, 79 y.o.   MRN: [id number]  HPI   Here for medicare wellness  Diet: heart healthy or DM if diabetic Physical activity: sedentary Depression/mood screen: negative Hearing: intact to whispered voice Visual acuity: grossly normal, performs annual eye exam  ADLs: capable Fall risk: none Home safety: good Cognitive evaluation: intact to orientation, naming, recall and repetition EOL planning: adv directives, full code/ I agree  I have personally reviewed and have noted 1. The patient's medical and social history 2. Their use of alcohol, tobacco or illicit drugs 3. Their current medications and supplements 4. The patient's functional ability including ADL's, fall risks, home safety risks and hearing or visual impairment. 5. Diet and physical activities 6. Evidence for depression or mood disorders    Also reviewed chronic medical issues:  COPD - no recent flares; follows with pulm for same - wearing O2 qhs - the patient reports compliance with medication(s) as prescribed. Denies adverse side effects.  Dyslipidemia - on statin - the patient reports compliance with medication(s) as prescribed. Denies adverse side effects. Request change to generic  Hypothyroid - the patient reports compliance with medication(s) as prescribed. Denies adverse side effects.  Gait disorder -accidental fall x 2 06/28/11, 06/10/11 and 01/2012  Frequent orthostatic symptoms with position changes> but intolerant of prior midodrine trial No confusion, vision changes, no incontinence or seizure - no loss of consciousness before or during fall - residual neck spasm and tightness Hip injection 01/2012 with [facility] - improved   Past Medical History  Diagnosis Date  . Allergic rhinitis   . COPD (chronic obstructive pulmonary disease)   . Pelvic cyst   . Bladder atony   . Unspecified hypothyroidism   . Hyperlipidemia   . Non-small cell  carcinoma of lung, stage 1 2005    1.4 cm Poorly differentiated Squamous cell RUL  T1N0 resected 09/22/2003  . Arthritis    FH and SocH also reviewed in EMR  Review of Systems  Constitutional: Positive for fatigue. Negative for fever and unexpected weight change.  Respiratory: Negative for cough and choking.   Cardiovascular: Negative for chest pain and leg swelling.  Gastrointestinal: Negative for abdominal pain and blood in stool.  Genitourinary: Positive for urgency and frequency (chronic). Negative for dysuria, hematuria, enuresis, difficulty urinating and pelvic pain.  Musculoskeletal: Negative for gait problem and joint swelling.  Psychiatric/Behavioral: Positive for sleep disturbance (chronic) and dysphoric mood (situational since 10/2012). Negative for suicidal ideas and behavioral problems. The patient is nervous/anxious.        Objective:   Physical Exam BP 128/70  Pulse 70  Temp(Src) 97.6 F (36.4 C) (Oral)  Wt 151 lb 12.8 oz (68.856 kg)  BMI 24.51 kg/m2  SpO2 98% Wt Readings from Last 3 Encounters:  04/10/13 151 lb 12.8 oz (68.856 kg)  03/04/13 155 lb 9.6 oz (70.58 kg)  01/08/13 155 lb 4.8 oz (70.444 kg)   Constitutional: She appears well-developed and well-nourished. No distress. Neck: Normal range of motion. Neck supple, but mild spasm. No JVD present. No thyromegaly present.  Cardiovascular: Normal rate, regular rhythm and normal heart sounds.  No murmur heard. No BLE edema. Pulmonary/Chest: Effort normal and breath sounds diminished. No respiratory distress. She has no wheezes.  Neuro: AAOx4, CN2-12,   speech fluent without dysarthria, follows commands and MAE - walks independent Psychiatric: She has a minimally anxious mood and affect. Her behavior is normal. Judgment and thought  content normal.   Lab Results  Component Value Date   WBC 8.5 12/31/2012   HGB 12.6 12/31/2012   HCT 37.2 12/31/2012   PLT 318 12/31/2012   CHOL 237* 02/22/2012   TRIG 91.0 02/22/2012    HDL 97.80 02/22/2012   LDLDIRECT 127.6 02/22/2012   ALT 22 12/31/2012   AST 25 12/31/2012   NA 138 12/31/2012   K 4.1 12/31/2012   CL 91* 04/06/2012   CREATININE 0.8 12/31/2012   BUN 8.3 12/31/2012   CO2 29 12/31/2012   TSH 2.62 02/22/2012   INR 0.94 02/05/2010   HGBA1C 5.2 02/22/2012      Assessment & Plan:   AWV/v70.0 - Today patient counseled on age appropriate routine health concerns for screening and prevention, each reviewed and up to date or declined. Immunizations reviewed and up to date or declined. Labs reviewed. Risk factors for depression reviewed and negative. Hearing function and visual acuity are intact. ADLs screened and addressed as needed. Functional ability and level of safety reviewed and appropriate. Education, counseling and referrals performed based on assessed risks today. Patient provided with a copy of personalized plan for preventive services.  Also see problem list. Medications and labs reviewed today.

## 2013-04-10 NOTE — Patient Instructions (Addendum)
It was good to see you today.  We have reviewed your prior records including labs and tests today  Your annual flu shot was given and/or updated today.  Health Maintenance reviewed - all recommended immunizations and age-appropriate screenings are up-to-date.  Test(s) ordered today. Your results will be released to MyChart (or called to you) after review, usually within 72hours after test completion. If any changes need to be made, you will be notified at that same time.  Medications reviewed and updated, no changes recommended at this time.  Please schedule followup in 6 months, call sooner if problems. Hang in there! I know these are rough times! And Happy [place] Ouch!!!   Health Maintenance, Females A healthy lifestyle and preventative care can promote health and wellness.  Maintain regular health, dental, and eye exams.  Eat a healthy diet. Foods like vegetables, fruits, whole grains, low-fat dairy products, and lean protein foods contain the nutrients you need without too many calories. Decrease your intake of foods high in solid fats, added sugars, and salt. Get information about a proper diet from your caregiver, if necessary.  Regular physical exercise is one of the most important things you can do for your health. Most adults should get at least 150 minutes of moderate-intensity exercise (any activity that increases your heart rate and causes you to sweat) each week. In addition, most adults need muscle-strengthening exercises on 2 or more days a week.   Maintain a healthy weight. The body mass index (BMI) is a screening tool to identify possible weight problems. It provides an estimate of body fat based on height and weight. Your caregiver can help determine your BMI, and can help you achieve or maintain a healthy weight. For adults 20 years and older:  A BMI below 18.5 is considered underweight.  A BMI of 18.5 to 24.9 is normal.  A BMI of 25 to 29.9 is considered  overweight.  A BMI of 30 and above is considered obese.  Maintain normal blood lipids and cholesterol by exercising and minimizing your intake of saturated fat. Eat a balanced diet with plenty of fruits and vegetables. Blood tests for lipids and cholesterol should begin at age 24 and be repeated every 5 years. If your lipid or cholesterol levels are high, you are over 50, or you are a high risk for heart disease, you may need your cholesterol levels checked more frequently.Ongoing high lipid and cholesterol levels should be treated with medicines if diet and exercise are not effective.  If you smoke, find out from your caregiver how to quit. If you do not use tobacco, do not start.  If you are pregnant, do not drink alcohol. If you are breastfeeding, be very cautious about drinking alcohol. If you are not pregnant and choose to drink alcohol, do not exceed 1 drink per day. One drink is considered to be 12 ounces (355 mL) of beer, 5 ounces (148 mL) of wine, or 1.5 ounces (44 mL) of liquor.  Avoid use of street drugs. Do not share needles with anyone. Ask for help if you need support or instructions about stopping the use of drugs.  High blood pressure causes heart disease and increases the risk of stroke. Blood pressure should be checked at least every 1 to 2 years. Ongoing high blood pressure should be treated with medicines, if weight loss and exercise are not effective.  If you are 79 to 77 years old, ask your caregiver if you should take aspirin to prevent strokes.  Diabetes screening involves taking a blood sample to check your fasting blood sugar level. This should be done once every 3 years, after age 45, if you are within normal weight and without risk factors for diabetes. Testing should be considered at a younger age or be carried out more frequently if you are overweight and have at least 1 risk factor for diabetes.  Breast cancer screening is essential preventative care for women. You  should practice "breast self-awareness." This means understanding the normal appearance and feel of your breasts and may include breast self-examination. Any changes detected, no matter how small, should be reported to a caregiver. Women in their 59s and 30s should have a clinical breast exam (CBE) by a caregiver as part of a regular health exam every 1 to 3 years. After age 50, women should have a CBE every year. Starting at age 19, women should consider having a mammogram (breast X-ray) every year. Women who have a family history of breast cancer should talk to their caregiver about genetic screening. Women at a high risk of breast cancer should talk to their caregiver about having an MRI and a mammogram every year.  The Pap test is a screening test for cervical cancer. Women should have a Pap test starting at age 75. Between ages 25 and 34, Pap tests should be repeated every 2 years. Beginning at age 50, you should have a Pap test every 3 years as long as the past 3 Pap tests have been normal. If you had a hysterectomy for a problem that was not cancer or a condition that could lead to cancer, then you no longer need Pap tests. If you are between ages 73 and 84, and you have had normal Pap tests going back 10 years, you no longer need Pap tests. If you have had past treatment for cervical cancer or a condition that could lead to cancer, you need Pap tests and screening for cancer for at least 20 years after your treatment. If Pap tests have been discontinued, risk factors (such as a new sexual partner) need to be reassessed to determine if screening should be resumed. Some women have medical problems that increase the chance of getting cervical cancer. In these cases, your caregiver may recommend more frequent screening and Pap tests.  The human papillomavirus (HPV) test is an additional test that may be used for cervical cancer screening. The HPV test looks for the virus that can cause the cell changes on  the cervix. The cells collected during the Pap test can be tested for HPV. The HPV test could be used to screen women aged 44 years and older, and should be used in women of any age who have unclear Pap test results. After the age of 56, women should have HPV testing at the same frequency as a Pap test.  Colorectal cancer can be detected and often prevented. Most routine colorectal cancer screening begins at the age of 24 and continues through age 66. However, your caregiver may recommend screening at an earlier age if you have risk factors for colon cancer. On a yearly basis, your caregiver may provide home test kits to check for hidden blood in the stool. Use of a small camera at the end of a tube, to directly examine the colon (sigmoidoscopy or colonoscopy), can detect the earliest forms of colorectal cancer. Talk to your caregiver about this at age 64, when routine screening begins. Direct examination of the colon should be repeated every 5  to 10 years through age 30, unless early forms of pre-cancerous polyps or small growths are found.  Hepatitis C blood testing is recommended for all people born from [birth year] through 1963 and any individual with known risks for hepatitis C.  Practice safe sex. Use condoms and avoid high-risk sexual practices to reduce the spread of sexually transmitted infections (STIs). Sexually active women aged 21 and younger should be checked for Chlamydia, which is a common sexually transmitted infection. Older women with new or multiple partners should also be tested for Chlamydia. Testing for other STIs is recommended if you are sexually active and at increased risk.  Osteoporosis is a disease in which the bones lose minerals and strength with aging. This can result in serious bone fractures. The risk of osteoporosis can be identified using a bone density scan. Women ages 91 and over and women at risk for fractures or osteoporosis should discuss screening with their caregivers. Ask  your caregiver whether you should be taking a calcium supplement or vitamin D to reduce the rate of osteoporosis.  Menopause can be associated with physical symptoms and risks. Hormone replacement therapy is available to decrease symptoms and risks. You should talk to your caregiver about whether hormone replacement therapy is right for you.  Use sunscreen with a sun protection factor (SPF) of 30 or greater. Apply sunscreen liberally and repeatedly throughout the day. You should seek shade when your shadow is shorter than you. Protect yourself by wearing long sleeves, pants, a wide-brimmed hat, and sunglasses year round, whenever you are outdoors.  Notify your caregiver of new moles or changes in moles, especially if there is a change in shape or color. Also notify your caregiver if a mole is larger than the size of a pencil eraser.  Stay current with your immunizations. Document Released: 12/20/2010 Document Revised: 08/29/2011 Document Reviewed: 12/20/2010 [facility] Patient Information 2014 [place], [place].

## 2013-04-10 NOTE — Assessment & Plan Note (Signed)
Prev on Lipitor - stopped due to myalgia - then increase lipids prompted resume of med tx - crestor caused muscle fatigue so decrease dose (protected by high HDL) resumed generic Lipitor 07/2011 10/2011 due to $$ issues -takes intermittently 

## 2013-04-15 ENCOUNTER — Other Ambulatory Visit: Payer: Self-pay | Admitting: *Deleted

## 2013-04-15 MED ORDER — ALPRAZOLAM 1 MG PO TABS: 1.0000 mg | ORAL_TABLET | Freq: Every evening | ORAL | Status: DC | PRN

## 2013-04-15 NOTE — Telephone Encounter (Signed)
[facility]ed [facility]...lmb 

## 2013-04-16 ENCOUNTER — Ambulatory Visit (INDEPENDENT_AMBULATORY_CARE_PROVIDER_SITE_OTHER): Payer: Medicare Other | Admitting: Internal Medicine

## 2013-04-16 ENCOUNTER — Encounter: Payer: Self-pay | Admitting: Internal Medicine

## 2013-04-16 VITALS — BP 124/76 | HR 70 | Ht 66.0 in | Wt 153.2 lb

## 2013-04-16 DIAGNOSIS — J449 Chronic obstructive pulmonary disease, unspecified: Secondary | ICD-10-CM

## 2013-04-16 DIAGNOSIS — J309 Allergic rhinitis, unspecified: Secondary | ICD-10-CM

## 2013-04-16 NOTE — Patient Instructions (Signed)
We can continue O2 2L for sleep and as needed  We can continue present meds  Please call as needed

## 2013-04-16 NOTE — Progress Notes (Signed)
Patient ID: [name], female    DOB: 1932/07/18, 79 y.o.   MRN: [id number]  HPI 816/12-79 year old female former smoker followed for history of right upper lobe cancer, COPD, allergic rhinitis  Here with husband Last here -August 05, 2010- note reviewed Increased  coughing 2 weeks, but struggling all summer. Better at beach. Here few days ago to see Dr Ronnald Ramp.   Had significantly productive cough- purulent, sometimes  with black spots. Uses nebulizer 2-3x/day, O2 2l/ [employer]. On Avelox now with Symbicort from Dr Ronnald Ramp. Mouth is getting sore. Still actively wheezing and short of breath.  CXR December 07, 2010- clear except old surgical changes.  She is not sure if she began this flare with cold or allergy. Low fever once or twice in past month. CT w/cm- 12/21/10- normal heart size coronary calcification. Prior right upper lobectomy. Stable right millimeter nodule at right minor fissure. No suspicious nodules. Centrilobular emphysema.  05/24/11- 79 year old female former smoker followed for history of right upper lobe cancer, COPD, allergic rhinitis. Here with husband Has had flu shot. She was feeling well until one week ago when she developed sore throat, temperature to 99.7, chills and malaise, cough productive of purulent blood-streaked sputum. Has home nebulizer being used only once daily with Xopenex. She denies swollen glands, fluid retention, GI upset or chest pain.  06/28/11- 79 year old female former smoker followed for history of right upper lobe cancer, COPD, allergic rhinitis. Here with husband Reports- wheezing, chest congestion, runny nose, cough with green/red mucus tinged with blood.  She got well after last visit and been sick again. Did have flu vaccine. Now 3 or 4 days of rhinorrhea, chills, productive cough with discolored sputum. 3 days ago had brief episode of pink in sputum with hard cough and. Cough and sputum production are less today. Occasional tussive soreness  at the xiphoid. Frequent heartburn. Doxycycline and seemed to help but is completed now. Does have home nebulizer.  Had chest x-ray January 7: *RADIOLOGY REPORT* 06/27/11- Clinical Data: 79 year old with hemoptysis.  CHEST - 2 VIEW  Comparison: Chest radiograph 05/24/2011  Findings: Chronic volume loss and postoperative changes in the  right hemithorax. The upper lungs are clear with increased  lucency. Findings suggest emphysematous changes. Stable linear  density at the right lung base is suggestive for scar. The heart  and mediastinum are stable. Slightly increased interstitial  densities in the right mid chest are similar to the prior  examination. No focal airspace disease.  IMPRESSION:  Stable chest radiograph findings. No acute changes.  Original Report Authenticated By: Markus Daft, M.D.   10/13/11- 79 year old female former smoker followed for history of right upper lobe cancer, COPD, allergic rhinitis. Here with husband Always has SOB, wheezing, cough, and congestion-has gotten worse due to pollen-eyes bothering her and runny nose(sneezing) Increased congestion in head and nose. She had had a pneumonia in December and then had fallen at home in January, bruising her face. Using nebulizer twice a day. Complains portable oxygen is too heavy and is thinking about [facility]  02/16/12- 79 year old female former smoker followed for history of right upper lobe NSCCa, COPD, allergic rhinitis. Here with husband Sore throat and tongue-? white spots started 2-3 days ago; last night had cough-productive. Recent cortisone shot by orthopedist. COPD assessment test (CAT) score 13/24 CXR 01/06/12-reviewed with her IMPRESSION:  Stable emphysematous change and chronic postsurgical change of the  right lung without definite acute cardiopulmonary disease.  Original Report Authenticated By:  Waynard Reeds, M.D.   10/15/12- 58year-old female former smoker followed for history of  right upper lobe NSCCa, COPD, allergic rhinitis.  FOLLOWS ZOX:[id number] wheezing today; ? pollen causing it. Did well through the winter. Using oxygen 2 L/[facility]. Productive cough after being outdoors a lot yesterday. Clear sputum, no fever.  03/04/13- 81year-old female former smoker followed for history of right upper lobe Squamous Cell, COPD, allergic rhinitis FOLLOWS FOR:  Increased SOB and coughing w/ yellowish mucus since 9/11 Trying to help husband who has NHL. She reports one-week increased cough, green sputum. We sent doxycycline-sputum turning yellow. No fever or GI upset. Nasal congestion without headache. 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 Postoperative changes at the right lung base.  2. No acute cardiopulmonary disease or evidence for recurrent  disease.  Original Report Authenticated By: Marin Roberts, M.D.  04/16/13- 84year-old female former smoker followed for history of right upper lobe NSCCa, COPD, allergic rhinitis Follows for- Pt states her problems are improving.  Pt c/o mild prod cough with yellowish sputum with activity. Less cough, some nasal congestion. Less dyspnea on exertion. 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 HEENT:   No-  headaches, difficulty swallowing, tooth/dental problems, sore throat,     No-  sneezing, itching, ear ache, +nasal congestion, post nasal drip,  CV:  No-chest pain, orthopnea, PND, swelling in lower extremities, anasarca, dizziness, palpitations Resp:    +shortness of breath with exertion or at rest.                 +productive cough,   non-productive cough,    no-coughing up of blood-.              No- change in color of mucus.    Skin: No-   rash or lesions. GI:  No-   heartburn, indigestion, abdominal pain, nausea, vomiting,  GU:  MS:  +  joint pain or swelling.   Neuro- nothing unusual Psych:  No- change in mood or affect. No depression or anxiety.  No memory loss.     Objective:   Physical Exam General- Alert, Oriented, Affect-appropriate, Distress- none acute, +talkative Skin- rash-none, lesions- none, excoriation- none Lymphadenopathy- none Head- atraumatic            Eyes- Gross vision intact, PERRLA, conjunctivae clear secretions            Ears- Hearing, canals normal            Nose- Clear, no-Septal dev, mucus, polyps, erosion, perforation             Throat- Mallampati III-IV , mucosa clear , drainage- none, tonsils- atrophic; mild thrush Neck- flexible , trachea midline, no stridor , thyroid nl, carotid no bruit Chest - symmetrical excursion , unlabored           Heart/CV- RRR , no murmur , no gallop  , no rub, nl s1 s2                           - JVD- none , edema- none, stasis changes- none, varices- none  Lung- wheeze- none, cough-none, talkative , dullness-none, rub- none           Chest wall-  Abd-  Br/ Gen/ Rectal- Not done, not indicated Extrem- cyanosis- none, clubbing, none, atrophy- none, strength- nl Neuro- grossly intact to observation

## 2013-04-18 ENCOUNTER — Telehealth: Payer: Self-pay | Admitting: Internal Medicine

## 2013-04-18 NOTE — Telephone Encounter (Signed)
Notified pt with md response.../lmb 

## 2013-04-18 NOTE — Telephone Encounter (Signed)
04/18/2013  Pt left message in regards to office visit from last week.  Pt had some questions and would like Dr. Felicity Coyer or her assist to contact.  Pt # [phone]

## 2013-04-18 NOTE — Telephone Encounter (Signed)
Okay to mail results Okay to take Lipitor every other day

## 2013-04-18 NOTE — Telephone Encounter (Signed)
Called pt she states she wasn't able to access mychart. Wanting to get lab results and copy mail to her. Wanting to know if md wanted her to continue taking the lipitor. She has change her eating habits, healthier lifestyle. Med make her muscle ache. Wanting to know what md recommend her to do. Can she take med every other day...Raechel Chute

## 2013-04-29 NOTE — Assessment & Plan Note (Signed)
Mild seasonal nasal congestion

## 2013-04-29 NOTE — Assessment & Plan Note (Signed)
Improving COPD after exacerbation Plan-keep oxygen for now and continue present meds

## 2013-05-09 ENCOUNTER — Telehealth: Payer: Self-pay | Admitting: Internal Medicine

## 2013-05-09 MED ORDER — LEVALBUTEROL HCL 1.25 MG/3ML IN NEBU: 1.2500 mg | INHALATION_SOLUTION | Freq: Three times a day (TID) | RESPIRATORY_TRACT | Status: DC | PRN

## 2013-05-09 NOTE — Telephone Encounter (Signed)
Rx has been sent in for instead of . Pt is aware.

## 2013-05-13 ENCOUNTER — Other Ambulatory Visit: Payer: Self-pay

## 2013-05-13 DIAGNOSIS — Z1231 Encounter for screening mammogram for malignant neoplasm of breast: Secondary | ICD-10-CM

## 2013-06-18 ENCOUNTER — Ambulatory Visit
Admission: RE | Admit: 2013-06-18 | Discharge: 2013-06-18 | Disposition: A | Discharge status: Home / Self Care | Payer: Medicare Other | Source: Ambulatory Visit

## 2013-06-18 DIAGNOSIS — Z1231 Encounter for screening mammogram for malignant neoplasm of breast: Secondary | ICD-10-CM

## 2013-06-25 ENCOUNTER — Other Ambulatory Visit: Payer: Self-pay | Admitting: Internal Medicine

## 2013-06-25 DIAGNOSIS — N63 Unspecified lump in unspecified breast: Secondary | ICD-10-CM

## 2013-06-25 DIAGNOSIS — N644 Mastodynia: Secondary | ICD-10-CM

## 2013-07-04 ENCOUNTER — Telehealth: Payer: Self-pay | Admitting: Internal Medicine

## 2013-07-04 MED ORDER — BENZONATATE 100 MG PO CAPS: 100.0000 mg | ORAL_CAPSULE | Freq: Four times a day (QID) | ORAL | Status: DC | PRN

## 2013-07-04 MED ORDER — PREDNISONE 10 MG PO TABS: ORAL_TABLET | ORAL | Status: DC

## 2013-07-04 NOTE — Telephone Encounter (Signed)
Called and spoke with pt and she is aware of CY recs and that these meds have been sent to the pharmacy.

## 2013-07-04 NOTE — Telephone Encounter (Signed)
Last OV 04-16-13, next 6 months. Pt is c/o having increased productive cough with clear phlegm, and increased chest congestion x 3 days. Pt deneis any fever, aches, chills, sob, wheezing, chest tightness. Pt is asking for a refill on benzonatate and rx for prednisone. Please advise.  Bing, CMA Allergies  Allergen Reactions  . Daliresp [Roflumilast] Nausea Only  . Tiotropium Bromide Monohydrate     Urinary retention  . Ampicillin     REACTION: GI upset  . Azithromycin     REACTION: diarrhea  . Codeine   . Fludrocortisone Acetate Itching  . Nitrofurantoin     REACTION: Upset GI  . Penicillins   . Sulfonamide Derivatives

## 2013-07-04 NOTE — Telephone Encounter (Signed)
Ok refill benzonatate x 3 months  Ok Rx prednisone 10 mg, # 20, 4 X 2 DAYS, 3 X 2 DAYS, 2 X 2 DAYS, 1 X 2 DAYS

## 2013-07-10 ENCOUNTER — Ambulatory Visit
Admission: RE | Admit: 2013-07-10 | Discharge: 2013-07-10 | Disposition: A | Discharge status: Home / Self Care | Payer: Medicare Other | Source: Ambulatory Visit | Attending: Internal Medicine | Admitting: Internal Medicine

## 2013-07-10 DIAGNOSIS — N63 Unspecified lump in unspecified breast: Secondary | ICD-10-CM

## 2013-07-10 DIAGNOSIS — N644 Mastodynia: Secondary | ICD-10-CM

## 2013-07-15 IMAGING — CT CT ABD-PELV W/ CM
2 of 5 series · 17 of 46 positions shown, 19 images · IV contrast (Omnipaque 300)
Comparison: 07/23/2010.

CLINICAL DATA: Abdominal and pelvic pain.

CT ABDOMEN AND PELVIS WITH CONTRAST
TECHNIQUE: Multidetector CT imaging of the abdomen and pelvis was
performed following the standard protocol during bolus
administration of intravenous contrast.
Contrast: 100mL OMNIPAQUE IOHEXOL 300 MG/ML  SOLN

[Series 2: abd/ pel 5mm · axial · 0.70mm/px · z∈[-450,-90]mm · 14 of 82 slices shown, 16 images]
[im 5/82  soft-tissue]
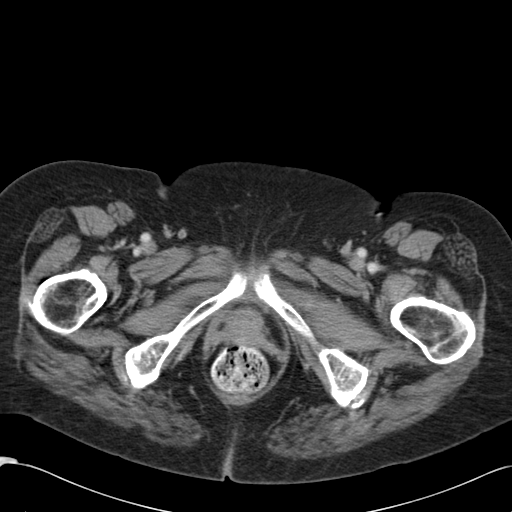
[im 5/82  bone]
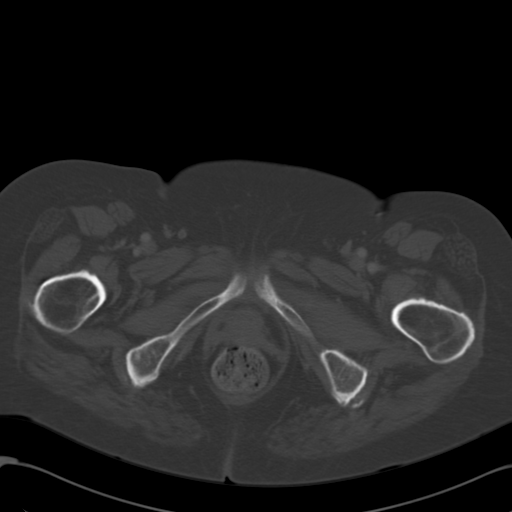
[im 13/82  soft-tissue]
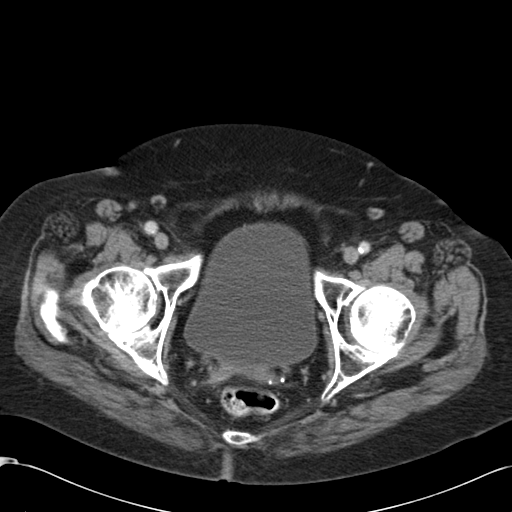
[im 17/82  soft-tissue]
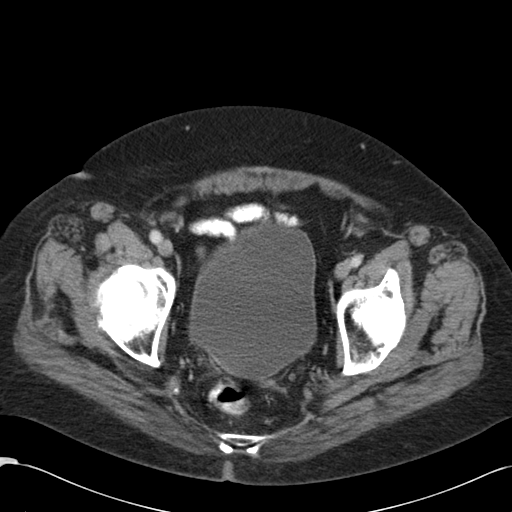
[im 21/82  soft-tissue]
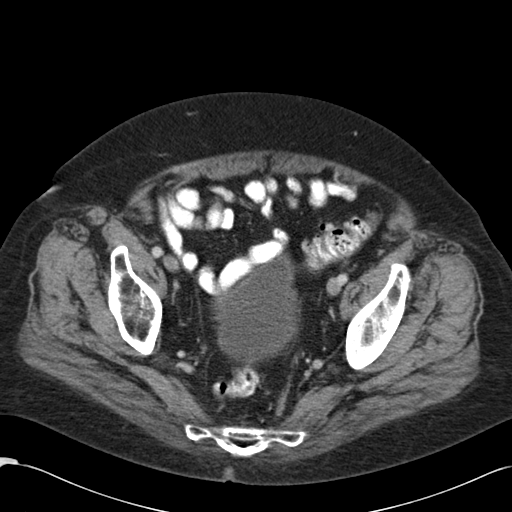
[im 29/82  soft-tissue]
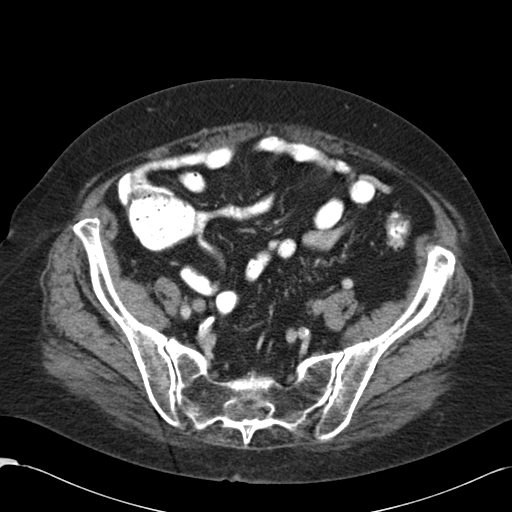
[im 33/82  soft-tissue]
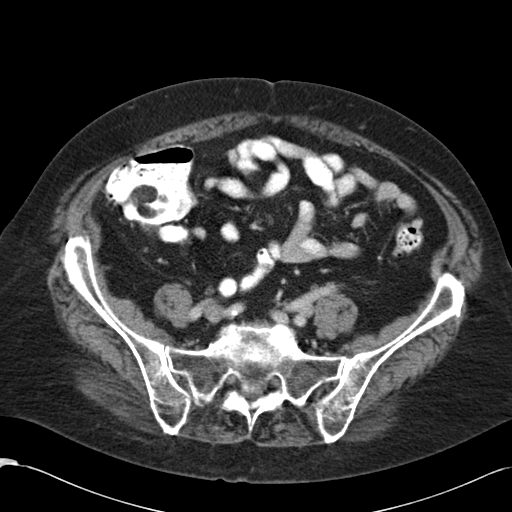
[im 37/82  soft-tissue]
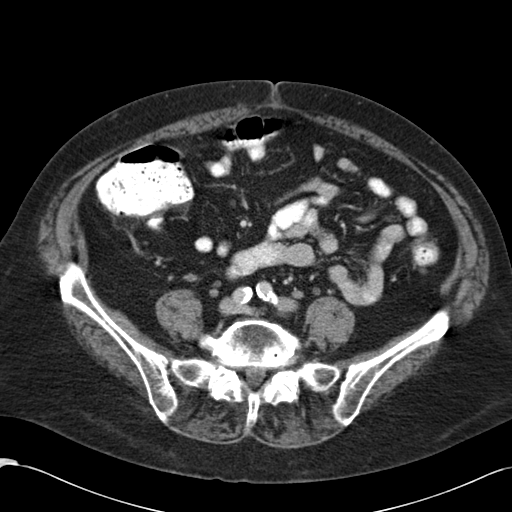
[im 45/82  soft-tissue]
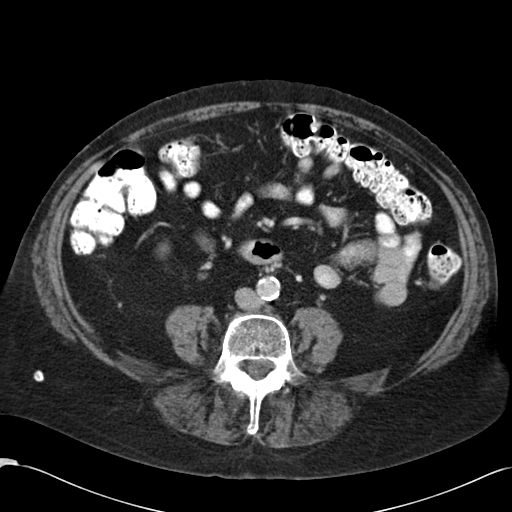
[im 49/82  soft-tissue]
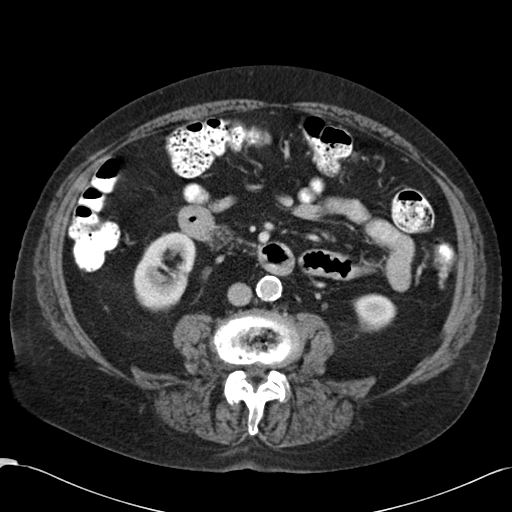
[im 49/82  bone]
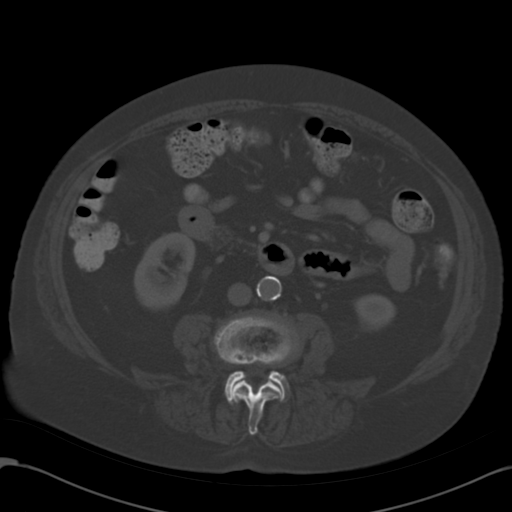
[im 53/82  soft-tissue]
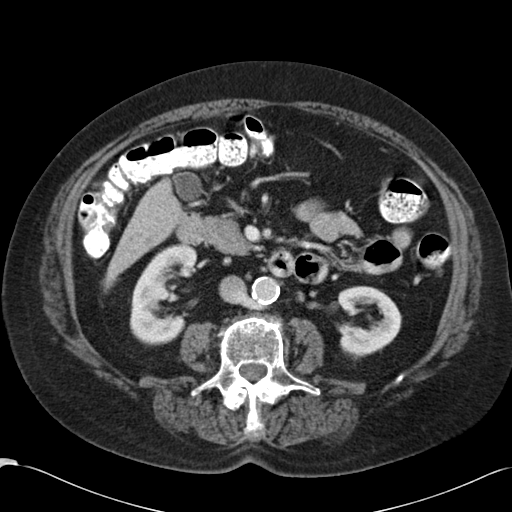
[im 61/82  soft-tissue]
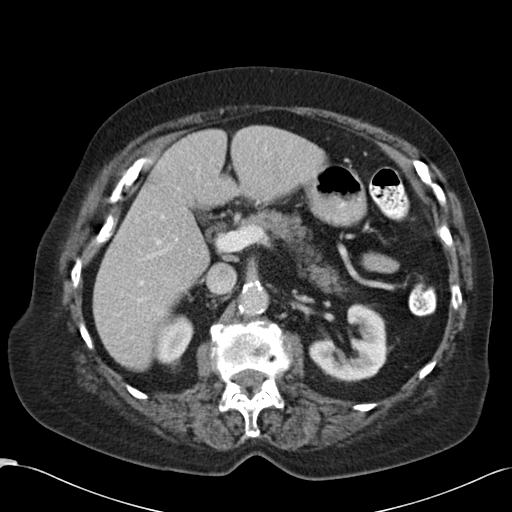
[im 65/82  soft-tissue]
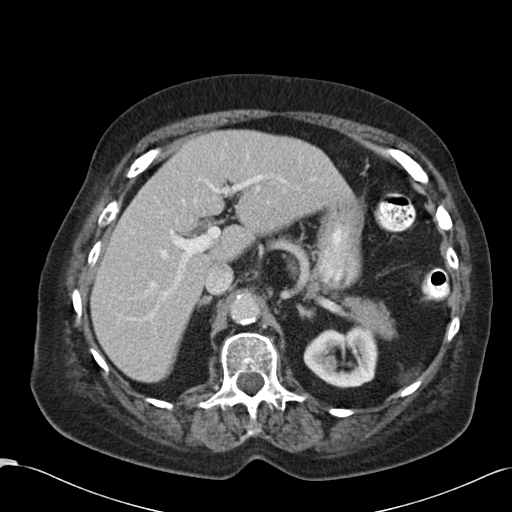
[im 69/82  soft-tissue]
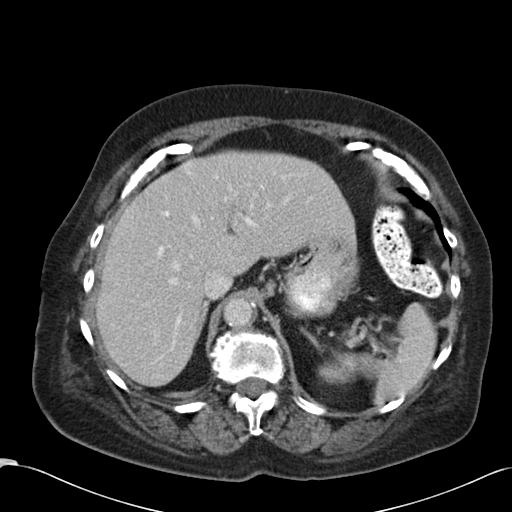
[im 77/82  soft-tissue]
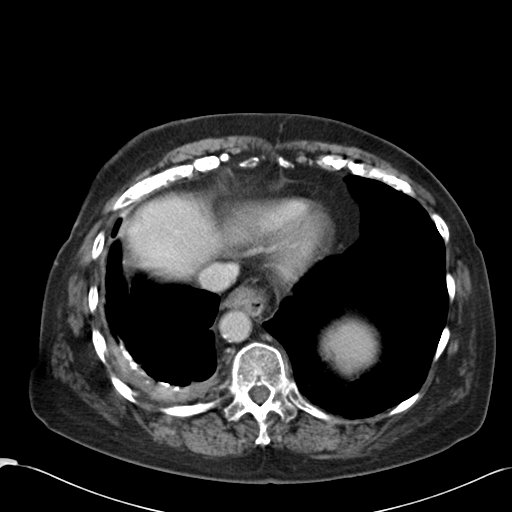

[Series 602: cor · coronal · 0.83mm/px · 3 of 116 slices shown]
[im 39/116  soft-tissue]
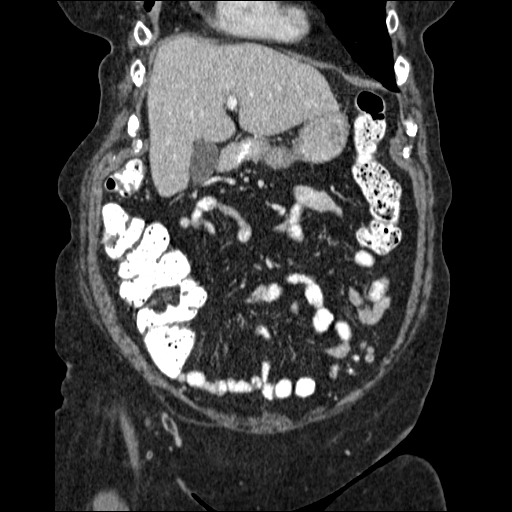
[im 52/116  soft-tissue]
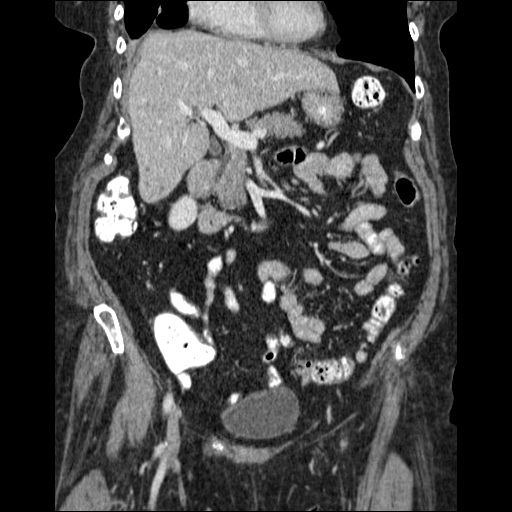
[im 64/116  soft-tissue]
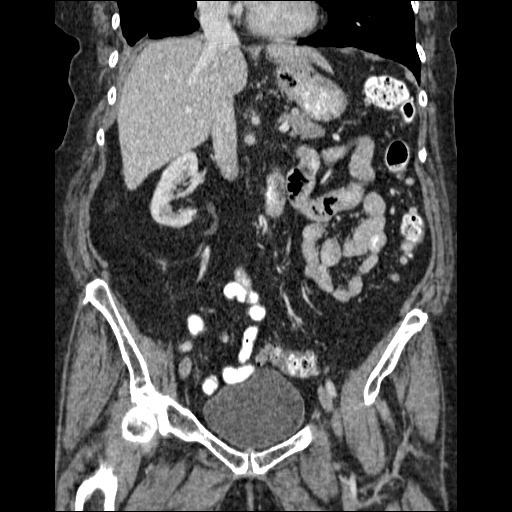

[17 of 46 positions shown; findings below may reference images not displayed]

FINDINGS: The lung bases demonstrate stable emphysematous changes,
surgical changes and chronic pleural thickening.

A small hiatal hernia is stable.  The stomach is not well distended
but no gross abnormalities are seen.  The duodenum, small bowel and
colon are unremarkable except for stable advanced diverticulosis of
the sigmoid colon.

The liver is unremarkable and stable.  No worrisome hepatic lesions
or intrahepatic ductal dilatation.  Tiny cysts are unchanged.  The
gallbladder is unremarkable.  No common bile duct dilatation.  The
pancreas is normal and stable.  The spleen is normal in size.  No
focal lesions.  The adrenal glands and kidneys demonstrate no
significant findings.

There are dense atherosclerotic calcifications involving the aorta
and branch vessels but no focal aneurysm or dissection.  No
mesenteric or retroperitoneal masses or adenopathy.

The uterus is surgically absent.  The bladder appears normal.  No
pelvic mass, adenopathy or free pelvic fluid collections.  No
inguinal mass or hernia.

Stable degenerative changes and osteoporosis involving the spine.
IMPRESSION: 1.  Chronic emphysematous changes and right basilar surgical
changes, scarring and pleural thickening.
2.  No acute abdominal/pelvic findings, mass lesions or adenopathy.

## 2013-07-20 ENCOUNTER — Other Ambulatory Visit: Payer: Self-pay | Admitting: Internal Medicine

## 2013-07-22 ENCOUNTER — Telehealth: Payer: Self-pay | Admitting: Internal Medicine

## 2013-07-22 MED ORDER — PREDNISONE 10 MG PO TABS: ORAL_TABLET | ORAL | Status: DC

## 2013-07-22 MED ORDER — LEVALBUTEROL HCL 1.25 MG/3ML IN NEBU: 1.2500 mg | INHALATION_SOLUTION | Freq: Three times a day (TID) | RESPIRATORY_TRACT | Status: DC | PRN

## 2013-07-22 NOTE — Telephone Encounter (Signed)
Called and spoke with pt. She c/o HA, increase SOB-used o2 all day long yesterday, non stop prod cough-white phlem, chest hurts from coughing x 2 days-worse yesterday. Denies any fever, no chills, no sweats. Taking tess pearls and dayquil as well Please advise Dr. Annamaria Boots thanks  Allergies  Allergen Reactions  . Daliresp [Roflumilast] Nausea Only  . Tiotropium Bromide Monohydrate     Urinary retention  . Ampicillin     REACTION: GI upset  . Azithromycin     REACTION: diarrhea  . Codeine   . Fludrocortisone Acetate Itching  . Nitrofurantoin     REACTION: Upset GI  . Penicillins   . Sulfonamide Derivatives      Current Outpatient Prescriptions on File Prior to Visit  Medication Sig Dispense Refill  . acetaminophen (TYLENOL ARTHRITIS PAIN) 650 MG CR tablet Take 650 mg by mouth as needed for pain.      [name] ALPRAZolam (XANAX) 1 MG tablet Take 1 tablet (1 mg total) by mouth at bedtime as needed for sleep or anxiety.  30 tablet  5  . atorvastatin (LIPITOR) 40 MG tablet Take 1 tablet (40 mg total) by mouth daily.  90 tablet  1  . beclomethasone (QVAR) 80 MCG/ACT inhaler Inhale 2 puffs into the lungs 2 (two) times daily.  1 Inhaler  5  . benzonatate (TESSALON) 100 MG capsule Take 1 capsule (100 mg total) by mouth every 6 (six) hours as needed for cough.  30 capsule  3  . levalbuterol (XOPENEX HFA) 45 MCG/ACT inhaler Inhale 2 puffs into the lungs 4 (four) times daily as needed. As rescue inhaler      . levalbuterol (XOPENEX) 1.25 MG/3ML nebulizer solution Take 1.25 mg by nebulization every 8 (eight) hours as needed. DX  496  180 mL  0  . levothyroxine (SYNTHROID, LEVOTHROID) 100 MCG tablet Take 1 tablet (100 mcg total) by mouth daily before breakfast.  90 tablet  1  . Multiple Vitamins-Minerals (CENTRUM SILVER PO) Take 1 tablet by mouth daily.       . promethazine (PHENERGAN) 25 MG tablet Take 1-2 tablets (25-50 mg total) by mouth 4 (four) times daily as needed for nausea.  30 tablet  0   No  current facility-administered medications on file prior to visit.

## 2013-07-22 NOTE — Telephone Encounter (Signed)
Called and spoke with pt. Aware of recs. RX called in for pt. Nothing further needed

## 2013-07-22 NOTE — Telephone Encounter (Signed)
Doesn't sound like a bacterial infection so will hold off on antibiotic. Offer Pred taper to reduce inflammation   Pred 10 mg, # 20, 4 X 2 DAYS, 3 X 2 DAYS, 2 X 2 DAYS, 1 X 2 DAYS

## 2013-07-23 IMAGING — CR DG HIP (WITH OR WITHOUT PELVIS) 2-3V*L*
3 series · 3 of 3 positions shown · non-contrast
Comparison: None.

CLINICAL DATA: Fell in [REDACTED] with left hip pain

LEFT HIP - COMPLETE 2+ VIEW

[view not recorded (1 of 3)]
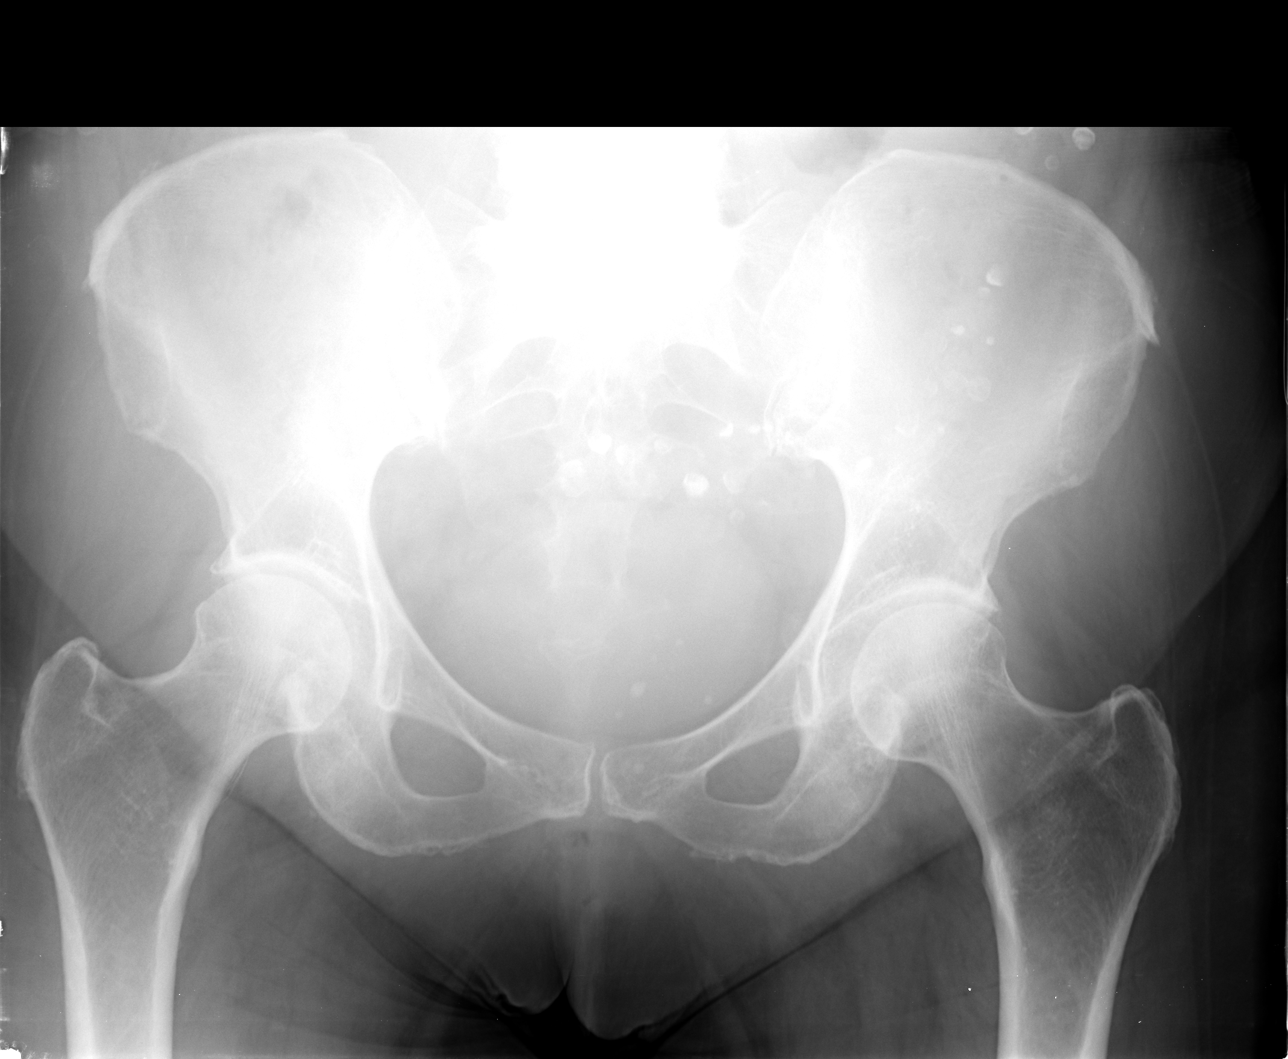

[view not recorded (2 of 3)]
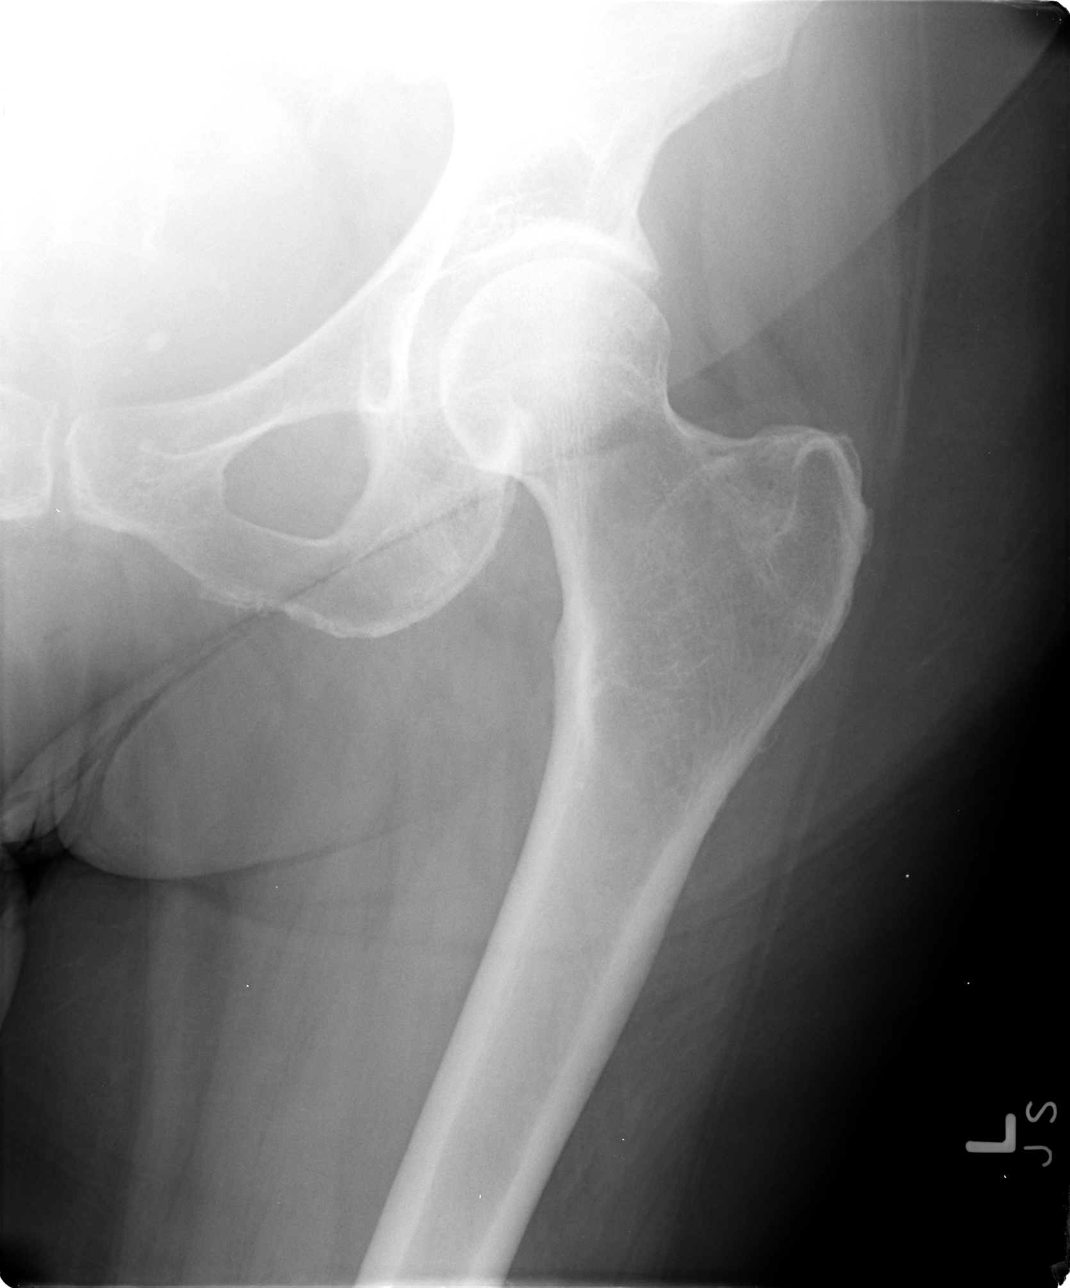

[view not recorded (3 of 3)]
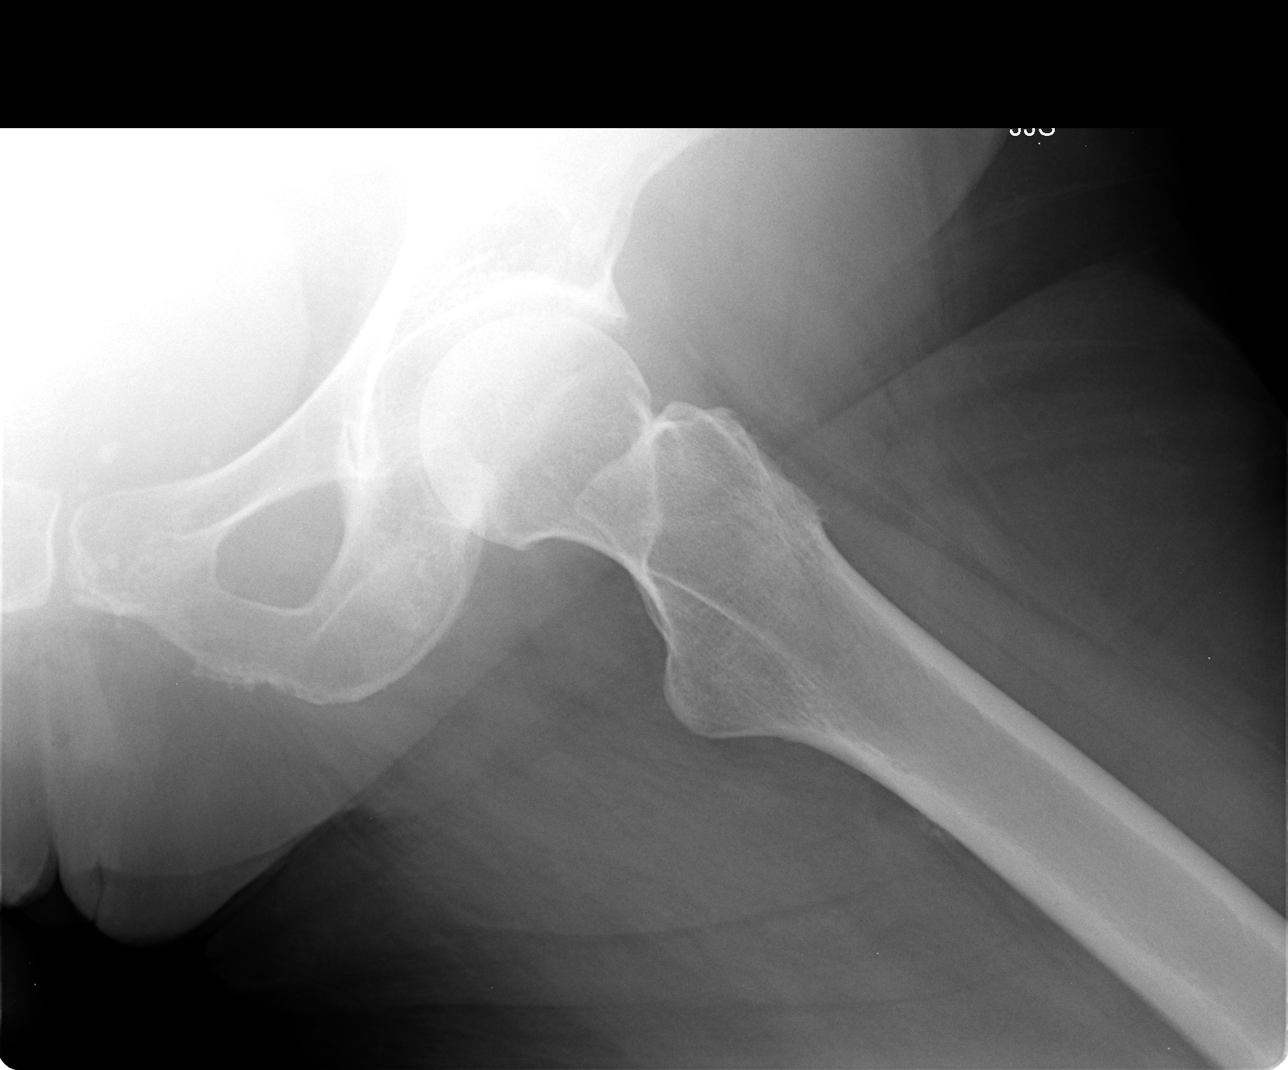

[3 of 3 positions shown; findings below may reference images not displayed]

FINDINGS: There are mild degenerative changes in both hips with
slight loss of joint space and superior acetabular spurring.  No
acute fracture is seen.  The pelvic rami are intact.  The SI joints
appear normal.  There are degenerative changes present in the lower
lumbar spine.  Contrast is noted within multiple distal descending
and sigmoid colonic diverticula.
IMPRESSION: 1.  Mild degenerative joint disease of the hips.  No acute
abnormality.
2.  Degenerative change is noted in the lower lumbar spine.

## 2013-07-24 ENCOUNTER — Telehealth: Payer: Self-pay | Admitting: Internal Medicine

## 2013-07-24 NOTE — Telephone Encounter (Signed)
Pt is returning triage's call.

## 2013-07-24 NOTE — Telephone Encounter (Signed)
[id number] x1 

## 2013-07-24 NOTE — Telephone Encounter (Signed)
I spoke with the pt and she states she has been using the tessalon perles but this has not been helping control her cough. She states she has mucinex and Delsym at home and wanted to know could she take these instead. Pt advised ok to take these medications and to call if no better. Pt denies any fever, any changes on any symptoms as before when she called on 07-22-13. She is on day 2 of pred taper. Boyce Bing, CMA

## 2013-07-26 ENCOUNTER — Ambulatory Visit (INDEPENDENT_AMBULATORY_CARE_PROVIDER_SITE_OTHER): Payer: Medicare Other | Admitting: Internal Medicine

## 2013-07-26 ENCOUNTER — Ambulatory Visit (INDEPENDENT_AMBULATORY_CARE_PROVIDER_SITE_OTHER)
Admission: RE | Admit: 2013-07-26 | Discharge: 2013-07-26 | Disposition: A | Discharge status: Home / Self Care | Payer: Medicare Other | Source: Ambulatory Visit | Attending: Internal Medicine | Admitting: Internal Medicine

## 2013-07-26 ENCOUNTER — Encounter: Payer: Self-pay | Admitting: Internal Medicine

## 2013-07-26 ENCOUNTER — Telehealth: Payer: Self-pay | Admitting: Internal Medicine

## 2013-07-26 VITALS — BP 122/76 | HR 88 | Ht 66.0 in | Wt 156.0 lb

## 2013-07-26 DIAGNOSIS — J441 Chronic obstructive pulmonary disease with (acute) exacerbation: Secondary | ICD-10-CM

## 2013-07-26 DIAGNOSIS — J45901 Unspecified asthma with (acute) exacerbation: Secondary | ICD-10-CM

## 2013-07-26 MED ORDER — MOXIFLOXACIN HCL 400 MG PO TABS: 400.0000 mg | ORAL_TABLET | Freq: Every day | ORAL | Status: DC

## 2013-07-26 MED ORDER — METHYLPREDNISOLONE ACETATE 80 MG/ML IJ SUSP
80.0000 mg | Freq: Once | INTRAMUSCULAR | Status: AC
  Administered 2013-07-26: 80 mg via INTRAMUSCULAR

## 2013-07-26 MED ORDER — LEVALBUTEROL HCL 0.63 MG/3ML IN NEBU
0.6300 mg | INHALATION_SOLUTION | Freq: Once | RESPIRATORY_TRACT | Status: AC
  Administered 2013-07-26: 0.63 mg via RESPIRATORY_TRACT

## 2013-07-26 NOTE — Progress Notes (Signed)
Patient ID: [name], female    DOB: [birth year], 80 y.o.   MRN: [id number]  HPI 816/12-80 year old female former smoker followed for history of right upper lobe cancer, COPD, allergic rhinitis  Here with husband Last here -August 05, 2010- note reviewed Increased  coughing 2 weeks, but struggling all summer. Better at beach. Here few days ago to see Dr Ronnald Ramp.   Had significantly productive cough- purulent, sometimes  with black spots. Uses nebulizer 2-3x/day, O2 2l/ [employer]. On Avelox now with Symbicort from Dr Ronnald Ramp. Mouth is getting sore. Still actively wheezing and short of breath.  CXR December 07, 2010- clear except old surgical changes.  She is not sure if she began this flare with cold or allergy. Low fever once or twice in past month. CT w/cm- 12/21/10- normal heart size coronary calcification. Prior right upper lobectomy. Stable right millimeter nodule at right minor fissure. No suspicious nodules. Centrilobular emphysema.  05/24/11- 80 year old female former smoker followed for history of right upper lobe cancer, COPD, allergic rhinitis. Here with husband Has had flu shot. She was feeling well until one week ago when she developed sore throat, temperature to 99.7, chills and malaise, cough productive of purulent blood-streaked sputum. Has home nebulizer being used only once daily with Xopenex. She denies swollen glands, fluid retention, GI upset or chest pain.  06/28/11- 80 year old female former smoker followed for history of right upper lobe cancer, COPD, allergic rhinitis. Here with husband Reports- wheezing, chest congestion, runny nose, cough with green/red mucus tinged with blood.  She got well after last visit and been sick again. Did have flu vaccine. Now 3 or 4 days of rhinorrhea, chills, productive cough with discolored sputum. 3 days ago had brief episode of pink in sputum with hard cough and. Cough and sputum production are less today. Occasional tussive soreness  at the xiphoid. Frequent heartburn. Doxycycline and seemed to help but is completed now. Does have home nebulizer.  Had chest x-ray January 7: *RADIOLOGY REPORT* 06/27/11- Clinical Data: 80 year old with hemoptysis.  CHEST - 2 VIEW  Comparison: Chest radiograph 05/24/2011  Findings: Chronic volume loss and postoperative changes in the  right hemithorax. The upper lungs are clear with increased  lucency. Findings suggest emphysematous changes. Stable linear  density at the right lung base is suggestive for scar. The heart  and mediastinum are stable. Slightly increased interstitial  densities in the right mid chest are similar to the prior  examination. No focal airspace disease.  IMPRESSION:  Stable chest radiograph findings. No acute changes.  Original Report Authenticated By: Markus Daft, M.D.   10/13/11- 80 year old female former smoker followed for history of right upper lobe cancer, COPD, allergic rhinitis. Here with husband Always has SOB, wheezing, cough, and congestion-has gotten worse due to pollen-eyes bothering her and runny nose(sneezing) Increased congestion in head and nose. She had had a pneumonia in December and then had fallen at home in January, bruising her face. Using nebulizer twice a day. Complains portable oxygen is too heavy and is thinking about [facility] to [facility]  02/16/12- 80 year old female former smoker followed for history of right upper lobe NSCCa, COPD, allergic rhinitis. Here with husband Sore throat and tongue-? white spots started 2-3 days ago; last night had cough-productive. Recent cortisone shot by orthopedist. COPD assessment test (CAT) score 13/24 CXR 01/06/12-reviewed with her IMPRESSION:  Stable emphysematous change and chronic postsurgical change of the  right lung without definite acute cardiopulmonary disease.  Original Report Authenticated By:  Rachel Moulds, M.D.   10/15/12- 76year-old female former smoker followed for history of  right upper lobe NSCCa, COPD, allergic rhinitis.  FOLLOWS [employer]:[id number] wheezing today; ? pollen causing it. Did well through the winter. Using oxygen 2 L/[facility]. Productive cough after being outdoors a lot yesterday. Clear sputum, no fever.  03/04/13- 55year-old female former smoker followed for history of right upper lobe Squamous Cell, COPD, allergic rhinitis FOLLOWS FOR:  Increased SOB and coughing w/ yellowish mucus since 9/11 Trying to help husband who has NHL. She reports one-week increased cough, green sputum. We sent doxycycline-sputum turning yellow. No fever or GI upset. Nasal congestion without headache. 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 Postoperative changes at the right lung base.  2. No acute cardiopulmonary disease or evidence for recurrent  disease.  Original Report Authenticated By: San Morelle, M.D.  04/16/13- 89year-old female former smoker followed for history of right upper lobe NSCCa, COPD, allergic rhinitis Follows for- Pt states her problems are improving.  Pt c/o mild prod cough with yellowish sputum with activity. Less cough, some nasal congestion. Less dyspnea on exertion. 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 HR and BP were high. Did not go to ER.   patient describes cough, white sputum, blowing white from nose, headache, raw throat. Denies fever or myalgias except feels sore across front of chest from coughing. No GI  upset. CXR 12/31/12 IMPRESSION:  1. Postoperative changes at the right lung base.  2. No acute cardiopulmonary disease or evidence for recurrent  disease.  Original Report Authenticated By: San Morelle, M.D.  Review of Systems-see HPI Constitutional:   No-   weight loss, night sweats , chills, fatigue, lassitude. HEENT:   No-  headaches, difficulty swallowing, tooth/dental problems, sore throat,     No-  sneezing, itching, ear ache, +nasal congestion, post nasal drip,  CV:  +chest pain, orthopnea, PND, swelling in lower extremities, anasarca, dizziness, palpitations Resp:    +shortness of breath with exertion or at rest.                 +productive cough,  +-productive cough,    no-coughing up of blood-.             No- change in color of mucus.    Skin: No-   rash or lesions. GI:  No-   heartburn, indigestion, abdominal pain, nausea, vomiting,  GU:  MS:  +  joint pain or swelling.   Neuro- nothing unusual Psych:  No- change in mood or affect. No depression or anxiety.  No memory loss.     Objective:   Physical Exam General- Alert, Oriented, Affect-appropriate, Distress- none acute, wheelchair                 O2 3 L 97% Skin- rash-none, lesions- none, excoriation- none Lymphadenopathy- none Head- atraumatic            Eyes- Gross vision intact, PERRLA, conjunctivae clear secretions  Ears- Hearing, canals normal            Nose- Clear, no-Septal dev, mucus, polyps, erosion, perforation             Throat- Mallampati III-IV , mucosa clear , drainage- none, tonsils- atrophic; mild thrush Neck- flexible , trachea midline, no stridor , thyroid nl, carotid no bruit Chest - symmetrical excursion , unlabored           Heart/CV- RRR , no murmur , no gallop  , no rub, nl s1 s2                           - JVD- none , edema- none, stasis changes- none, varices- none           Lung- wheeze- none, cough-none, talkative , dullness-none, rub- none           Chest wall-  Abd-   Br/ Gen/ Rectal- Not done, not indicated Extrem- cyanosis- none, clubbing, none, atrophy- none, strength- nl Neuro- grossly intact to observation

## 2013-07-26 NOTE — Telephone Encounter (Signed)
Spoke with pt. She was having audible wheezing over the phone, BP 136/78, rapid heart beat, lots of coughing and congestion. She is on prednisone and mucinex. Reports something doesn;t feel right. Wants to be worked in today. She ahd to call 911 yesterday and they gave her breathing [place].  Please advise Dr. Annamaria Boots thanks  Allergies  Allergen Reactions  . Daliresp [Roflumilast] Nausea Only  . Tiotropium Bromide Monohydrate     Urinary retention  . Ampicillin     REACTION: GI upset  . Azithromycin     REACTION: diarrhea  . Codeine   . Fludrocortisone Acetate Itching  . Nitrofurantoin     REACTION: Upset GI  . Penicillins   . Sulfonamide Derivatives

## 2013-07-26 NOTE — Telephone Encounter (Signed)
Spoke with patient-she will be here at 3:45pm to see CY at 4:00pm.

## 2013-07-26 NOTE — Patient Instructions (Signed)
Neb xop 0.63  Depo 80  A probiotic like Activia yogurt or Florastor may be helpful while on antibiotics  Script for Avelox antibiotic sent   Order- CXR dx acute exacerbation of COPD bronchitis

## 2013-07-29 ENCOUNTER — Telehealth: Payer: Self-pay | Admitting: Internal Medicine

## 2013-07-29 NOTE — Telephone Encounter (Signed)
Called and advised pt of cxr result per CY.  Pt verbalized understanding and states is still having a lot of congestion. She has x4 days left antibiotic, have advised pt to complete course and if she still is having congestion and cough to call back and schedule appointment Nothing further is needed at this time

## 2013-07-31 IMAGING — CR DG CHEST 2V
2 series · 2 of 2 positions shown · non-contrast
Comparison: 06/27/2011; 05/24/2011; chest CT - 12/21/2010

CLINICAL DATA: History of lung cancer, shortness of breath, COPD

CHEST - 2 VIEW

[w chest pa]
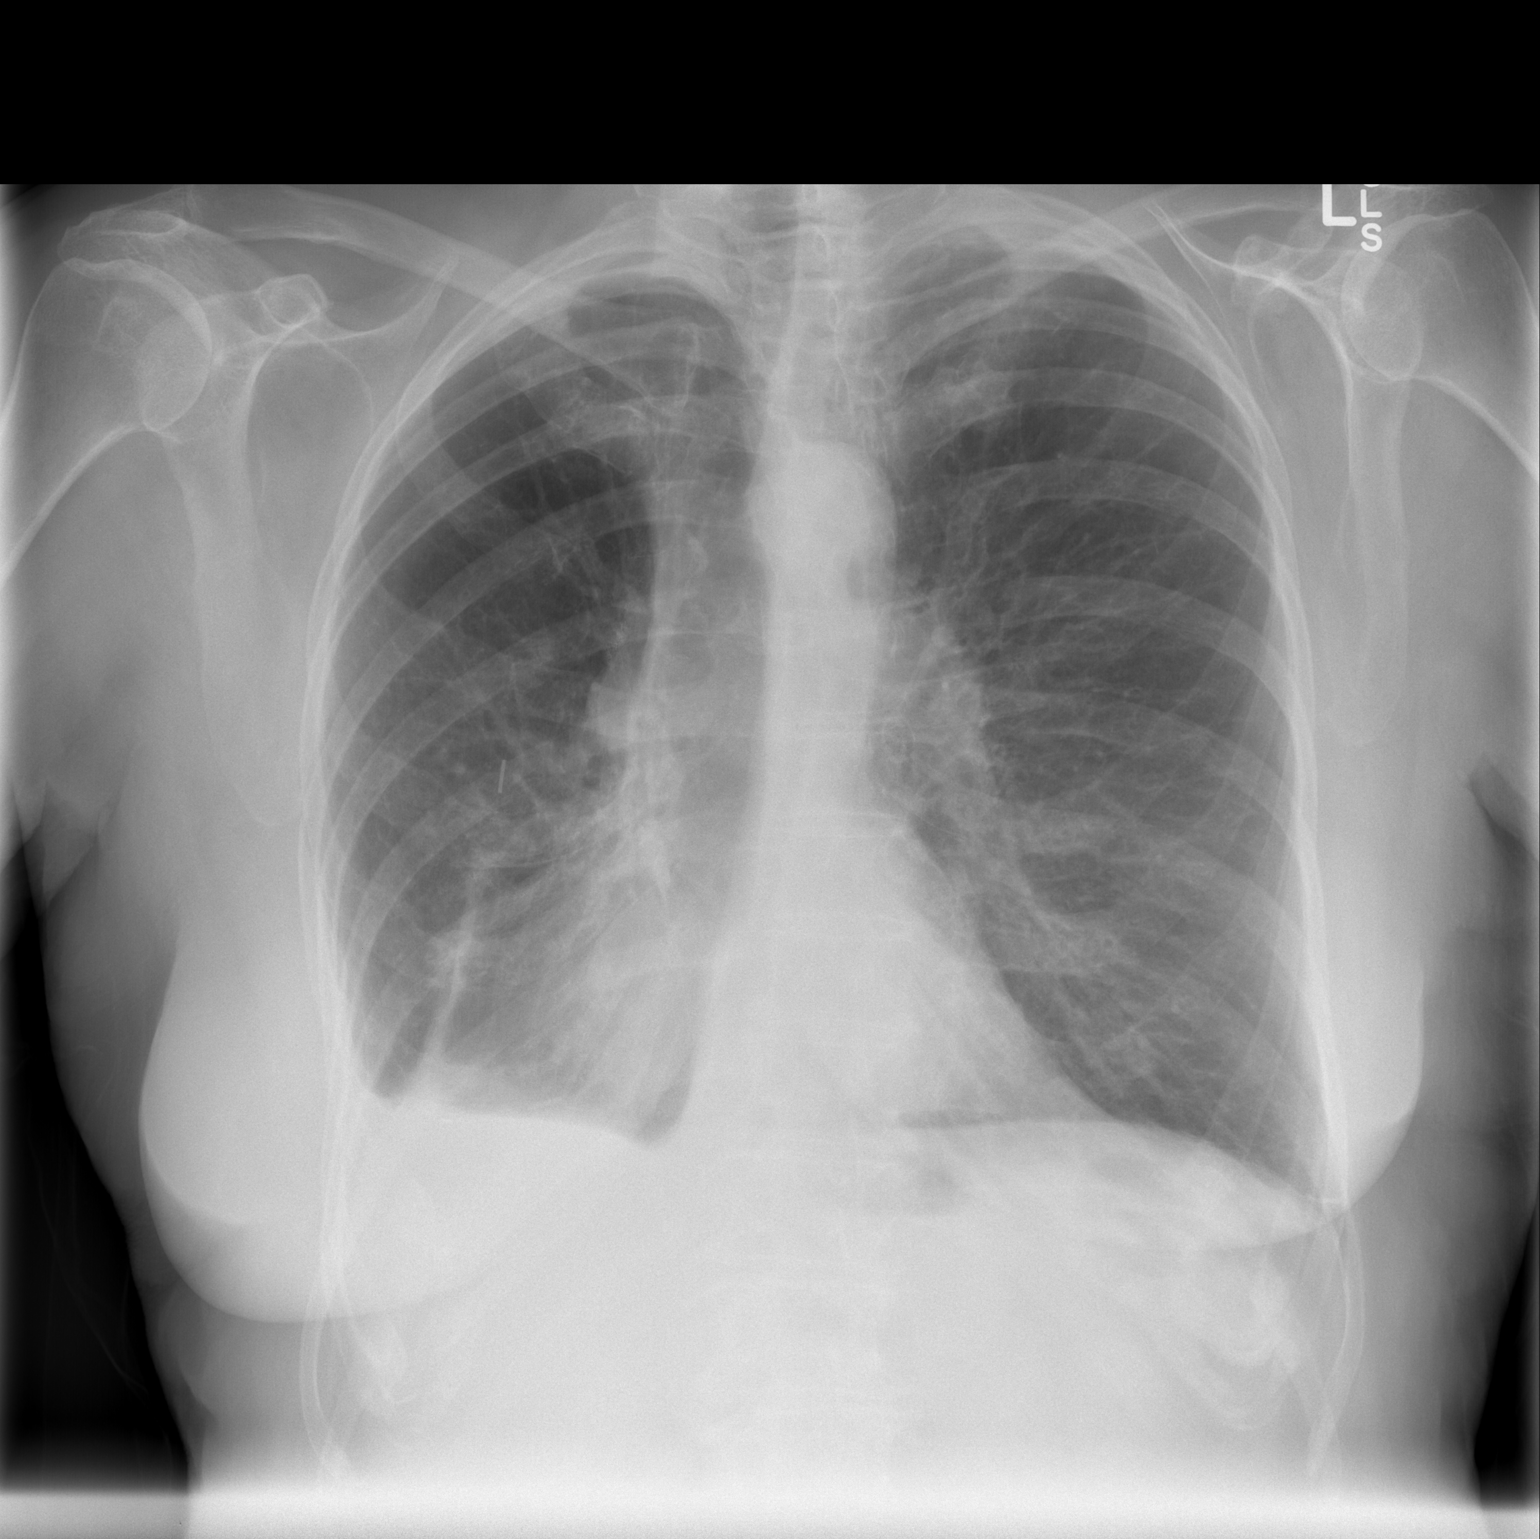

[w chest lat]
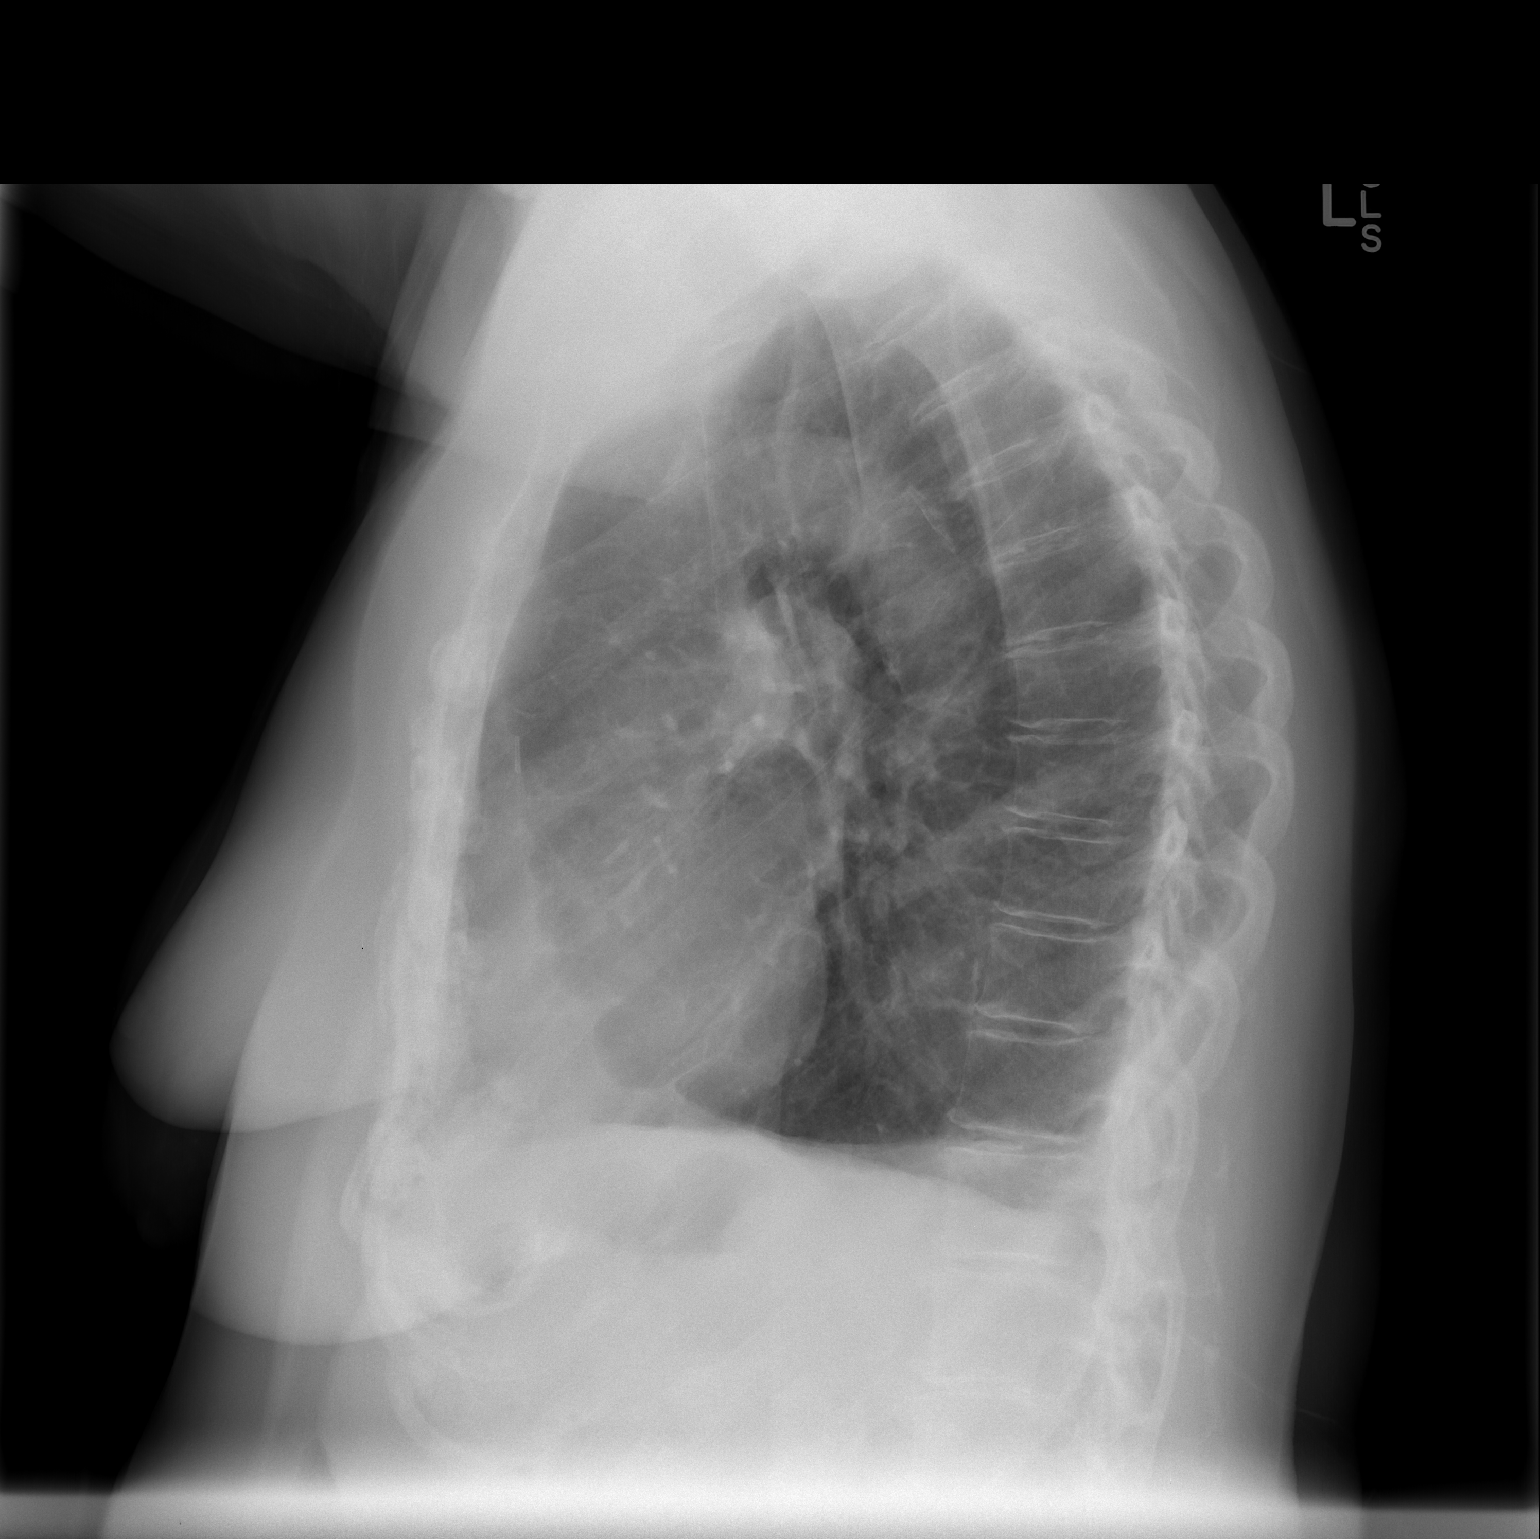

[2 of 2 positions shown; findings below may reference images not displayed]

FINDINGS: Grossly unchanged cardiac silhouette and mediastinal
contours.  There is chronic blunting of the right costophrenic
angle with chronic right basilar linear heterogeneous opacities
favored to represent subsegmental atelectasis or scar.  The lungs
are hyperexpanded.  No new focal airspace opacities.  No
pneumothorax.  Unchanged bones.
IMPRESSION: Stable emphysematous change and chronic postsurgical change of the
right lung without definite acute cardiopulmonary disease.

## 2013-08-01 ENCOUNTER — Telehealth: Payer: Self-pay | Admitting: Internal Medicine

## 2013-08-01 MED ORDER — METHYLPREDNISOLONE 8 MG PO TABS: 8.0000 mg | ORAL_TABLET | Freq: Every day | ORAL | Status: DC

## 2013-08-01 NOTE — Telephone Encounter (Signed)
Spoke with pt. States she is not feeling any better since seeing CY on 07/26/13. Still reports coughing, chest congestion, sinus congestion and nausea. She is having to use oxygen all the time at 2lpm. Wants to know how to proceed.  Allergies  Allergen Reactions  . Daliresp [Roflumilast] Nausea Only  . Tiotropium Bromide Monohydrate     Urinary retention  . Ampicillin     REACTION: GI upset  . Azithromycin     REACTION: diarrhea  . Codeine   . Fludrocortisone Acetate Itching  . Nitrofurantoin     REACTION: Upset GI  . Penicillins   . Sulfonamide Derivatives    Current Outpatient Prescriptions on File Prior to Visit  Medication Sig Dispense Refill  . acetaminophen (TYLENOL ARTHRITIS PAIN) 650 MG CR tablet Take 650 mg by mouth as needed for pain.      [name] ALPRAZolam (XANAX) 1 MG tablet Take 1 tablet (1 mg total) by mouth at bedtime as needed for sleep or anxiety.  30 tablet  5  . atorvastatin (LIPITOR) 40 MG tablet Take 40 mg by mouth every other day.      . beclomethasone (QVAR) 80 MCG/ACT inhaler Inhale 2 puffs into the lungs 2 (two) times daily.  1 Inhaler  5  . benzonatate (TESSALON) 100 MG capsule Take 1 capsule (100 mg total) by mouth every 6 (six) hours as needed for cough.  30 capsule  3  . levalbuterol (XOPENEX HFA) 45 MCG/ACT inhaler Inhale 2 puffs into the lungs 4 (four) times daily as needed. As rescue inhaler      . levalbuterol (XOPENEX) 1.25 MG/3ML nebulizer solution TAKE 1.25 MG BY NEBULIZATION EVERY 8 (EIGHT) HOURS AS NEEDED. DX 496  144 mL  0  . levothyroxine (SYNTHROID, LEVOTHROID) 100 MCG tablet Take 1 tablet (100 mcg total) by mouth daily before breakfast.  90 tablet  1  . moxifloxacin (AVELOX) 400 MG tablet Take 1 tablet (400 mg total) by mouth daily.  7 tablet  0  . Multiple Vitamins-Minerals (CENTRUM SILVER PO) Take 1 tablet by mouth daily.       . predniSONE (DELTASONE) 10 MG tablet Take 4 tabs daily x 2 days, 3 tabs daily x 2 days, 2 tabs daily x 2 days, 1 tab  daily x 2 days then stop  20 tablet  0  . promethazine (PHENERGAN) 25 MG tablet Take 1-2 tablets (25-50 mg total) by mouth 4 (four) times daily as needed for nausea.  30 tablet  0   No current facility-administered medications on file prior to visit.    CY - please advise. Thanks.

## 2013-08-01 NOTE — Telephone Encounter (Signed)
Suggest Delsym otc if needed for cough               Offer medrol, 8 mg, # 10, 1 daily

## 2013-08-01 NOTE — Telephone Encounter (Signed)
Spoke with pt and advised of Dr Bertrum Sol recommendations.  Medrol sent to pharmacy.  Advised to call our office if no better after this treatment.

## 2013-08-17 ENCOUNTER — Telehealth: Payer: Self-pay | Admitting: *Deleted

## 2013-08-17 ENCOUNTER — Encounter: Payer: Self-pay | Admitting: Oncology

## 2013-08-17 NOTE — Telephone Encounter (Signed)
Mailed provider change letter w/ calendar to pt and cancelled Dr. Synthia Innocent appt.

## 2013-08-18 NOTE — Assessment & Plan Note (Signed)
Plan-nebulizer treatment Xopenex, Depo-Medrol, finished prednisone taper, Avelox. Lab for CBC, chest x-ray

## 2013-09-24 ENCOUNTER — Telehealth: Payer: Self-pay | Admitting: Internal Medicine

## 2013-09-24 DIAGNOSIS — S01309A Unspecified open wound of unspecified ear, initial encounter: Secondary | ICD-10-CM

## 2013-09-24 NOTE — Telephone Encounter (Signed)
Pt has a sore on her ear that won't heal.  She wants a referral to dermatology.

## 2013-09-24 NOTE — Telephone Encounter (Signed)
done

## 2013-09-25 ENCOUNTER — Ambulatory Visit (INDEPENDENT_AMBULATORY_CARE_PROVIDER_SITE_OTHER): Payer: Medicare Other | Admitting: Internal Medicine

## 2013-09-25 ENCOUNTER — Other Ambulatory Visit (INDEPENDENT_AMBULATORY_CARE_PROVIDER_SITE_OTHER): Payer: Medicare Other

## 2013-09-25 ENCOUNTER — Encounter: Payer: Self-pay | Admitting: Internal Medicine

## 2013-09-25 VITALS — BP 112/62 | HR 78 | Temp 97.9°F | Wt 149.4 lb

## 2013-09-25 DIAGNOSIS — E039 Hypothyroidism, unspecified: Secondary | ICD-10-CM

## 2013-09-25 DIAGNOSIS — E785 Hyperlipidemia, unspecified: Secondary | ICD-10-CM

## 2013-09-25 DIAGNOSIS — H9209 Otalgia, unspecified ear: Secondary | ICD-10-CM

## 2013-09-25 DIAGNOSIS — F411 Generalized anxiety disorder: Secondary | ICD-10-CM

## 2013-09-25 DIAGNOSIS — H9201 Otalgia, right ear: Secondary | ICD-10-CM

## 2013-09-25 LAB — LIPID PANEL
Cholesterol: 180 mg/dL (ref 0–200)
HDL: 73.1 mg/dL (ref >39.00)
LDL Cholesterol: 89 mg/dL (ref 0–99)
Total CHOL/HDL Ratio: 2
Triglycerides: 89 mg/dL (ref 0.0–149.0)
VLDL: 17.8 mg/dL (ref 0.0–40.0)

## 2013-09-25 LAB — TSH: TSH: 0.36 u[IU]/mL (ref 0.35–5.50)

## 2013-09-25 MED ORDER — LEVOTHYROXINE SODIUM 100 MCG PO TABS: 100.0000 ug | ORAL_TABLET | Freq: Every day | ORAL | Status: DC

## 2013-09-25 NOTE — Progress Notes (Signed)
Subjective:    Patient ID: [name], female    DOB: 10-Nov-1932, 80 y.o.   MRN: [id number]  HPI  Patient here today for follow-up.  Chronic medical issues reviewed.    Also c/o right ear pinnacle swelling and pain for last 2-3 weeks, not associated with fevers, chills, or swollen lymph nodes. No drainage, no hearing loss.  Not relieved by neosporin or alcohol wipes - some symptomatic improvement with hydrocortisone.   COPD - no recent flares; follows with pulm for same - wearing O2 qhs and occasionally during the day - the patient reports compliance with medication(s) as prescribed. Denies adverse side effects.   Dyslipidemia - on statin - the patient reports compliance with medication(s) as prescribed. Denies adverse side effects. Request change to generic   Hypothyroid - the patient reports compliance with medication(s) as prescribed. Denies adverse side effects.   Hip injection 01/2012 with [facility] - improved   Past Medical History  Diagnosis Date  . Allergic rhinitis   . COPD (chronic obstructive pulmonary disease)   . Pelvic cyst   . Bladder atony   . Unspecified hypothyroidism   . Hyperlipidemia   . Non-small cell carcinoma of lung, stage 1 2005    1.4 cm Poorly differentiated Squamous cell RUL  T1N0 resected 09/22/2003  . Arthritis      Review of Systems  Constitutional: Negative for fever, chills, activity change and appetite change.  HENT: Negative.   Respiratory: Negative for chest tightness, shortness of breath and wheezing.   Cardiovascular: Negative for chest pain, palpitations and leg swelling.  Gastrointestinal: Negative for nausea, vomiting, diarrhea and constipation.  Skin:       Right external ear pain and swelling  Neurological: Negative for dizziness, weakness and headaches.       Objective:   Physical Exam  Constitutional: She is oriented to person, place, and time. She appears well-developed and well-nourished. No distress.  HENT:  Head:  Normocephalic and atraumatic.  Left Ear: External ear normal.  Nose: Nose normal.  Mouth/Throat: Oropharynx is clear and moist. No oropharyngeal exudate.  Right ear pinnacle with erythema, crusted scab, no drainage.   Neck: Normal range of motion. Neck supple.  Cardiovascular: Normal rate, regular rhythm and normal heart sounds.   No murmur heard. Pulmonary/Chest: Effort normal and breath sounds normal. No respiratory distress. She has no wheezes.  Abdominal: Soft. Bowel sounds are normal. She exhibits no distension. There is no tenderness.  Musculoskeletal: Normal range of motion. She exhibits no edema and no tenderness.  Lymphadenopathy:    She has no cervical adenopathy.  Neurological: She is alert and oriented to person, place, and time.  Skin: Skin is warm and dry. No rash noted. She is not diaphoretic. No erythema.  Psychiatric: She has a normal mood and affect. Her behavior is normal. Judgment and thought content normal.    Wt Readings from Last 3 Encounters:  09/25/13 149 lb 6.4 oz (67.767 kg)  07/26/13 156 lb (70.761 kg)  04/16/13 153 lb 3.2 oz (69.491 kg)   BP Readings from Last 3 Encounters:  09/25/13 112/62  07/26/13 122/76  04/16/13 124/76   Lab Results  Component Value Date   WBC 7.9 04/10/2013   HGB 13.2 04/10/2013   HCT 39.6 04/10/2013   PLT 275.0 04/10/2013   GLUCOSE 102* 04/10/2013   CHOL 234* 04/10/2013   TRIG 73.0 04/10/2013   HDL 70.40 04/10/2013   LDLDIRECT 152.8 04/10/2013   LDLCALC 71 10/30/2009  ALT 22 12/31/2012   AST 25 12/31/2012   NA 133* 04/10/2013   K 4.0 04/10/2013   CL 95* 04/10/2013   CREATININE 0.7 04/10/2013   BUN 9 04/10/2013   CO2 30 04/10/2013   TSH 0.46 04/10/2013   INR 0.94 02/05/2010   HGBA1C 5.2 02/22/2012       Assessment & Plan:   Right external ear pain - will keep referral to Dr Allyson Sabal with dermatology scheduled for next week for further evaluation.  ?vesicle vs pustule - not manipulated by me today Ok for topical  steroid and use of spouses EMLA as needed  Problem List Items Addressed This Visit   Anxiety state, unspecified     Chronic symptoms, exacerbated by dx lung ca in spouse 10/2012, now ongoing chemo associated with insomnia - chronic BZ use for same, but insurance limits #/mo Has stopped EtOH use (but still drinks wine) Intol of traz and amitiptyline trials spring 2014    Dyslipidemia (Chronic)     Prev on Lipitor - stopped due to myalgia - then increase lipids prompted resume of med tx - crestor caused muscle fatigue so decrease dose (protected by high HDL) resumed generic Lipitor 07/2011 10/2011 due to $$ issues -takes intermittently    Relevant Orders      TSH      Lipid panel   HYPOTHYROIDISM      Lab Results  Component Value Date   TSH 0.46 04/10/2013  check TSH annually and prn The current medical regimen is effective;  continue present plan and medications.     Relevant Medications      levothyroxine (SYNTHROID, LEVOTHROID) tablet   Other Relevant Orders      TSH    Other Visit Diagnoses   Right ear pain    -  Primary

## 2013-09-25 NOTE — Assessment & Plan Note (Signed)
Chronic symptoms, exacerbated by dx lung ca in spouse 10/2012, now ongoing chemo associated with insomnia - chronic BZ use for same, but insurance limits #/mo Has stopped EtOH use (but still drinks wine) Intol of traz and amitiptyline trials spring 2014

## 2013-09-25 NOTE — Progress Notes (Signed)
Pre visit review using our clinic review tool, if applicable. No additional management support is needed unless otherwise documented below in the visit note. 

## 2013-09-25 NOTE — Assessment & Plan Note (Signed)
Prev on Lipitor - stopped due to myalgia - then increase lipids prompted resume of med tx - crestor caused muscle fatigue so decrease dose (protected by high HDL) resumed generic Lipitor 07/2011 10/2011 due to $$ issues -takes intermittently

## 2013-09-25 NOTE — Assessment & Plan Note (Signed)
Lab Results  Component Value Date   TSH 0.46 04/10/2013  check TSH annually and prn The current medical regimen is effective;  continue present plan and medications.

## 2013-09-25 NOTE — Patient Instructions (Signed)
It was good to see you today.  We have reviewed your prior records including labs and tests today  Medications reviewed and updated, no changes recommended at this time.  Keep a sore on your covered with hydrocortisone and Band-Aid, okay to use your spouse's numbing cream as needed Refill on medication(s) as discussed today.  Test(s) ordered today. Your results will be released to [place] (or called to you) after review, usually within 72hours after test completion. If any changes need to be made, you will be notified at that same time.  Please schedule followup in 6 months, call sooner if problems.

## 2013-10-04 ENCOUNTER — Other Ambulatory Visit: Payer: Self-pay | Admitting: Internal Medicine

## 2013-10-04 ENCOUNTER — Telehealth: Payer: Self-pay | Admitting: Internal Medicine

## 2013-10-04 MED ORDER — LEVALBUTEROL HCL 1.25 MG/3ML IN NEBU: INHALATION_SOLUTION | RESPIRATORY_TRACT | Status: DC

## 2013-10-04 NOTE — Telephone Encounter (Signed)
Called and spoke with the pt and she stated that she will need refill of the xopenex for her nebulizer.  This has been sent to the pharmacy and pt is aware.   She has a pending appt with CY next week.

## 2013-10-09 ENCOUNTER — Ambulatory Visit: Payer: Medicare Other | Admitting: Internal Medicine

## 2013-10-11 ENCOUNTER — Other Ambulatory Visit: Payer: Self-pay | Admitting: Internal Medicine

## 2013-10-15 ENCOUNTER — Encounter: Payer: Self-pay | Admitting: Internal Medicine

## 2013-10-15 ENCOUNTER — Ambulatory Visit (INDEPENDENT_AMBULATORY_CARE_PROVIDER_SITE_OTHER): Payer: Medicare Other | Admitting: Internal Medicine

## 2013-10-15 VITALS — BP 120/70 | HR 101 | Ht 66.0 in | Wt 151.2 lb

## 2013-10-15 DIAGNOSIS — J45901 Unspecified asthma with (acute) exacerbation: Principal | ICD-10-CM

## 2013-10-15 DIAGNOSIS — C3490 Malignant neoplasm of unspecified part of unspecified bronchus or lung: Secondary | ICD-10-CM

## 2013-10-15 DIAGNOSIS — J309 Allergic rhinitis, unspecified: Secondary | ICD-10-CM

## 2013-10-15 DIAGNOSIS — J302 Other seasonal allergic rhinitis: Secondary | ICD-10-CM

## 2013-10-15 DIAGNOSIS — J441 Chronic obstructive pulmonary disease with (acute) exacerbation: Secondary | ICD-10-CM

## 2013-10-15 MED ORDER — FLUTTER DEVI
Status: AC
Start: 2013-10-15 — End: ?

## 2013-10-15 NOTE — Progress Notes (Signed)
Patient ID: [name], female    DOB: March 04, 1933, 80 y.o.   MRN: [id number]  HPI 816/12-80 year old female former smoker followed for history of right upper lobe cancer, COPD, allergic rhinitis  Here with husband Last here -August 05, 2010- note reviewed Increased  coughing 2 weeks, but struggling all summer. Better at beach. Here few days ago to see Dr Ronnald Ramp.   Had significantly productive cough- purulent, sometimes  with black spots. Uses nebulizer 2-3x/day, O2 2l/ [employer]. On Avelox now with Symbicort from Dr Ronnald Ramp. Mouth is getting sore. Still actively wheezing and short of breath.  CXR December 07, 2010- clear except old surgical changes.  She is not sure if she began this flare with cold or allergy. Low fever once or twice in past month. CT w/cm- 12/21/10- normal heart size coronary calcification. Prior right upper lobectomy. Stable right millimeter nodule at right minor fissure. No suspicious nodules. Centrilobular emphysema.  05/24/11- 80 year old female former smoker followed for history of right upper lobe cancer, COPD, allergic rhinitis. Here with husband Has had flu shot. She was feeling well until one week ago when she developed sore throat, temperature to 99.7, chills and malaise, cough productive of purulent blood-streaked sputum. Has home nebulizer being used only once daily with Xopenex. She denies swollen glands, fluid retention, GI upset or chest pain.  06/28/11- 80 year old female former smoker followed for history of right upper lobe cancer, COPD, allergic rhinitis. Here with husband Reports- wheezing, chest congestion, runny nose, cough with green/red mucus tinged with blood.  She got well after last visit and been sick again. Did have flu vaccine. Now 3 or 4 days of rhinorrhea, chills, productive cough with discolored sputum. 3 days ago had brief episode of pink in sputum with hard cough and. Cough and sputum production are less today. Occasional tussive soreness  at the xiphoid. Frequent heartburn. Doxycycline and seemed to help but is completed now. Does have home nebulizer.  Had chest x-ray January 7: *RADIOLOGY REPORT* 06/27/11- Clinical Data: 80 year old with hemoptysis.  CHEST - 2 VIEW  Comparison: Chest radiograph 05/24/2011  Findings: Chronic volume loss and postoperative changes in the  right hemithorax. The upper lungs are clear with increased  lucency. Findings suggest emphysematous changes. Stable linear  density at the right lung base is suggestive for scar. The heart  and mediastinum are stable. Slightly increased interstitial  densities in the right mid chest are similar to the prior  examination. No focal airspace disease.  IMPRESSION:  Stable chest radiograph findings. No acute changes.  Original Report Authenticated By: Markus Daft, M.D.   10/13/11- 80 year old female former smoker followed for history of right upper lobe cancer, COPD, allergic rhinitis. Here with husband Always has SOB, wheezing, cough, and congestion-has gotten worse due to pollen-eyes bothering her and runny nose(sneezing) Increased congestion in head and nose. She had had a pneumonia in December and then had fallen at home in January, bruising her face. Using nebulizer twice a day. Complains portable oxygen is too heavy and is thinking about [facility] to [facility]  02/16/12- 80 year old female former smoker followed for history of right upper lobe NSCCa, COPD, allergic rhinitis. Here with husband Sore throat and tongue-? white spots started 2-3 days ago; last night had cough-productive. Recent cortisone shot by orthopedist. COPD assessment test (CAT) score 13/24 CXR 01/06/12-reviewed with her IMPRESSION:  Stable emphysematous change and chronic postsurgical change of the  right lung without definite acute cardiopulmonary disease.  Original Report Authenticated By:  Rachel Moulds, M.D.   10/15/12- 44year-old female former smoker followed for history of  right upper lobe NSCCa, COPD, allergic rhinitis.  FOLLOWS NTI:[id number] wheezing today; ? pollen causing it. Did well through the winter. Using oxygen 2 L/[facility]. Productive cough after being outdoors a lot yesterday. Clear sputum, no fever.  03/04/13- 73year-old female former smoker followed for history of right upper lobe Squamous Cell, COPD, allergic rhinitis FOLLOWS FOR:  Increased SOB and coughing w/ yellowish mucus since 9/11 Trying to help husband who has NHL. She reports one-week increased cough, green sputum. We sent doxycycline-sputum turning yellow. No fever or GI upset. Nasal congestion without headache. 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 Postoperative changes at the right lung base.  2. No acute cardiopulmonary disease or evidence for recurrent  disease.  Original Report Authenticated By: San Morelle, M.D.  04/16/13- 41year-old female former smoker followed for history of right upper lobe NSCCa, COPD, allergic rhinitis Follows for- Pt states her problems are improving.  Pt c/o mild prod cough with yellowish sputum with activity. Less cough, some nasal congestion. Less dyspnea on exertion. 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 HR and BP were high. Did not go to ER.   patient describes cough, white sputum, blowing white from nose, headache, raw throat. Denies fever or myalgias except feels sore across front of chest from coughing. No GI  upset. CXR 12/31/12 IMPRESSION:  1. Postoperative changes at the right lung base.  2. No acute cardiopulmonary disease or evidence for recurrent  disease.  Original Report Authenticated By: San Morelle, M.D.  10/15/13- 20year-old female former smoker followed for history of right upper lobe NSCCa, COPD, allergic rhinitis, complicated by glaucoma   Family here    Husband has cancer FOLLOWS FOR:  Sinus and chest congestion with cough white mucus and sneezing x3 days Exacerbation cleared well with Avelox and that worried. Had been well until 3 days ago now-has sneezing, watery nose blamed on pollen. Damp weather increases cough and head congestion. Baseline is daily chronic productive cough with white sputum in the mornings. CXR 07/29/13 IMPRESSION:  Chronic volume loss and pleural thickening in the right hemithorax.  No acute chest findings.  Electronically Signed  By: Markus Daft M.D.  On: 07/26/2013 17:49  Review of Systems-see HPI Constitutional:   No-   weight loss, night sweats , chills, fatigue, lassitude. HEENT:   No-  headaches, difficulty swallowing, tooth/dental problems, sore throat,     No-  sneezing, itching, ear ache, +nasal congestion, post nasal drip,  CV:  +chest pain, orthopnea, PND, swelling in lower extremities, anasarca, dizziness, palpitations Resp:    +shortness of breath with exertion or at rest.                 +productive cough,  +non-productive cough,    no-coughing up of blood-.             No- change in color of mucus.    Skin: No-   rash or lesions. GI:  No-  heartburn, indigestion, abdominal pain, nausea, vomiting,  GU:  MS:  +  joint pain or swelling.   Neuro- nothing unusual Psych:  No- change in mood or affect. No depression or anxiety.  No memory loss.     Objective:   Physical Exam General- Alert, Oriented, Affect-appropriate, Distress- none acute, wheelchair                 O2 3 L 97% Skin- rash-none, lesions- none, excoriation-  none Lymphadenopathy- none Head- atraumatic            Eyes- Gross vision intact, PERRLA, conjunctivae clear secretions            Ears- Hearing, canals normal            Nose- Clear, no-Septal dev, mucus, polyps, erosion, perforation             Throat- Mallampati III-IV , mucosa clear , drainage- none, tonsils- atrophic; mild thrush Neck- flexible , trachea midline, no stridor , thyroid nl, carotid no bruit Chest - symmetrical excursion , unlabored           Heart/CV- RRR , no murmur , no gallop  , no rub, nl s1 s2                           - JVD- none , edema- none, stasis changes- none, varices- none           Lung- + rhonchi Left, wheeze- none, cough+rattling, talkative , dullness-none, rub- none           Chest wall-  Abd-  Br/ Gen/ Rectal- Not done, not indicated Extrem- cyanosis- none, clubbing, none, atrophy- none, strength- nl Neuro- grossly intact to observation

## 2013-10-15 NOTE — Patient Instructions (Signed)
Script for Flutter device    Blow through 4 times per cycle, 3 cycles per day, as needed to help clear mucus from your airways  We can continue present meds  Please call as needed

## 2013-10-18 ENCOUNTER — Telehealth: Payer: Self-pay

## 2013-10-18 ENCOUNTER — Other Ambulatory Visit: Payer: Self-pay | Admitting: Internal Medicine

## 2013-10-18 NOTE — Telephone Encounter (Signed)
[name] pt-MD is out of the office Patient last OV 09/25/2013 Medication last filled 04/15/2013

## 2013-10-18 NOTE — Telephone Encounter (Signed)
OK #[id number] 

## 2013-10-18 NOTE — Telephone Encounter (Signed)
Patient called [facility] requesting a call back, did not state why.

## 2013-10-18 NOTE — Telephone Encounter (Signed)
Called pt back. She stated that her rx refill of Alprazolam was approved however can not get it filled until the 6th of May because the previous rx ran out on 10/17/13. OV was on 10/11/13. She is asking for a partial fill to get her to the 6th when she is able to have her new rx filled. Please advise.

## 2013-10-30 IMAGING — CT CT ABD-PELV W/ CM
2 of 5 series · 17 of 46 positions shown, 19 images · IV contrast (APPLIED)
Comparison: 12/21/2011

CLINICAL DATA: Nausea, emesis.

CT ABDOMEN AND PELVIS WITH CONTRAST
TECHNIQUE: Multidetector CT imaging of the abdomen and pelvis was
performed following the standard protocol during bolus
administration of intravenous contrast.
Contrast: 100mL OMNIPAQUE IOHEXOL 300 MG/ML  SOLN

[Series 2: abd/pelv with 5.0 b31f st · axial · 0.82mm/px · z∈[-460,-60]mm · 14 of 90 slices shown, 16 images]
[im 5/90  soft-tissue]
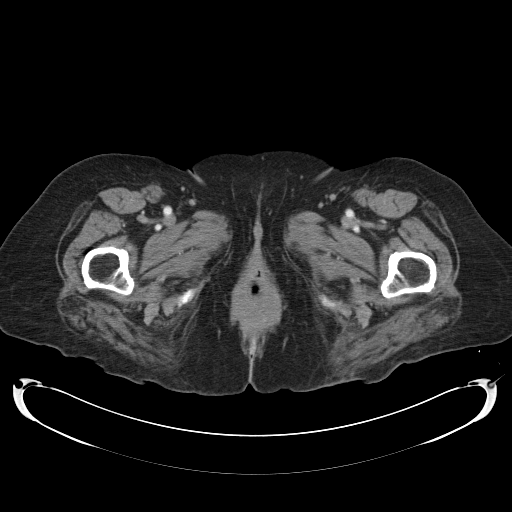
[im 5/90  bone]
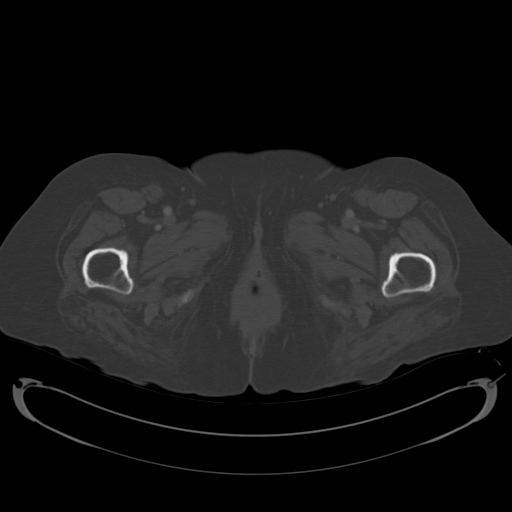
[im 10/90  soft-tissue]
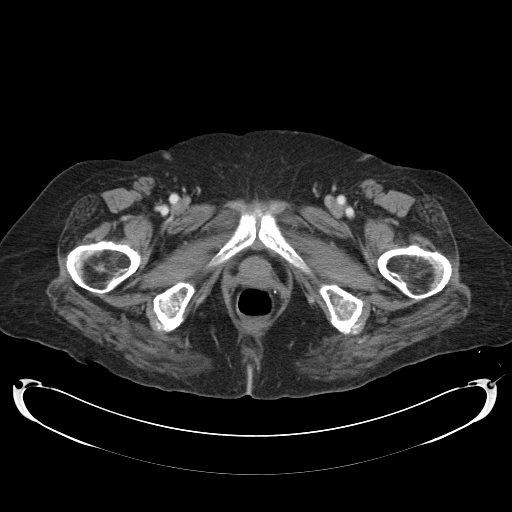
[im 19/90  soft-tissue]
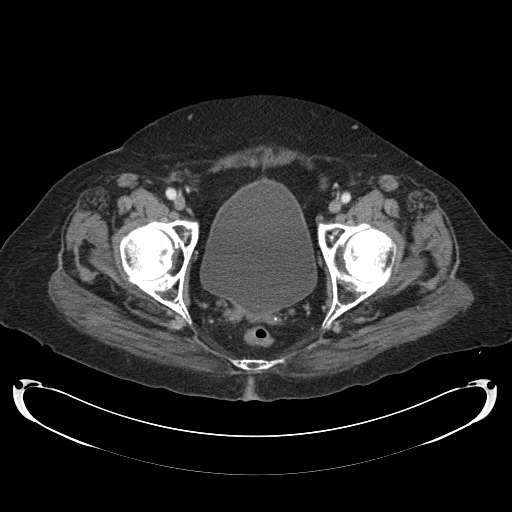
[im 24/90  soft-tissue]
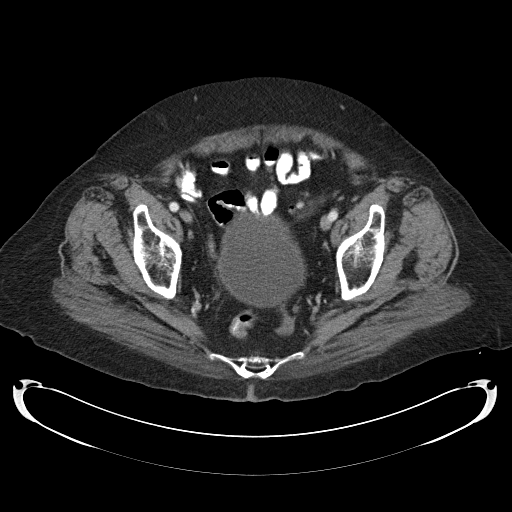
[im 29/90  soft-tissue]
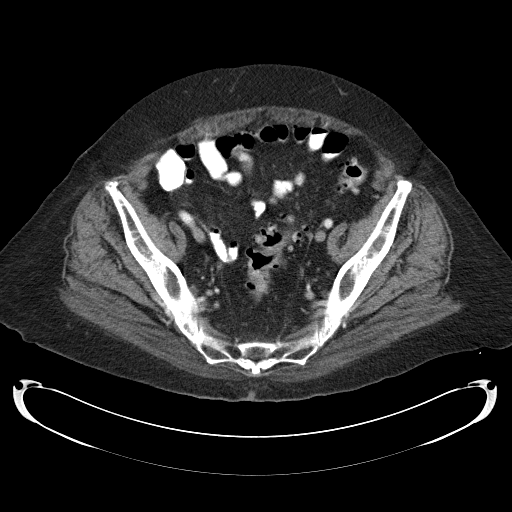
[im 38/90  soft-tissue]
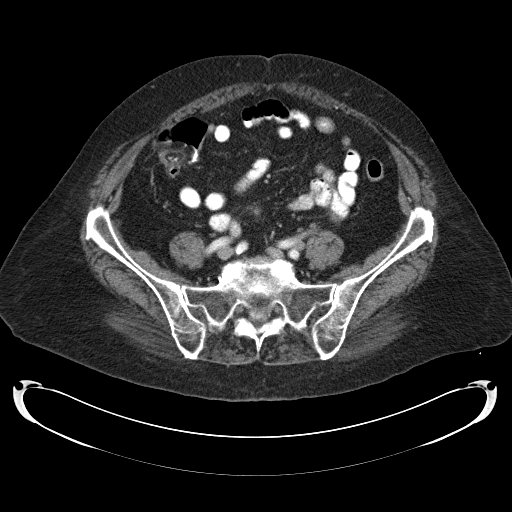
[im 43/90  soft-tissue]
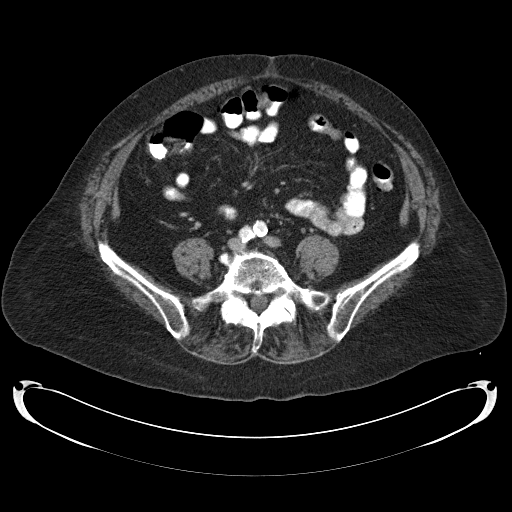
[im 47/90  soft-tissue]
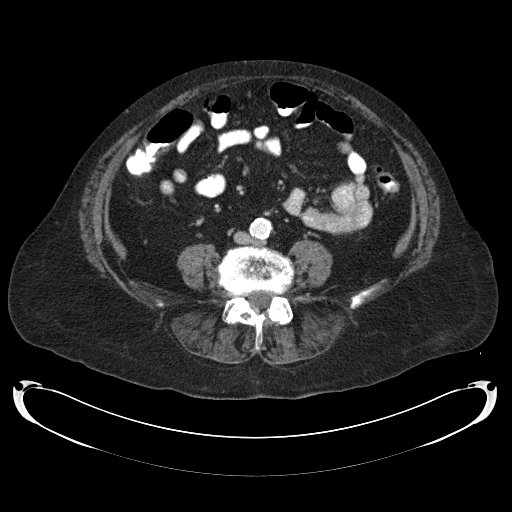
[im 52/90  soft-tissue]
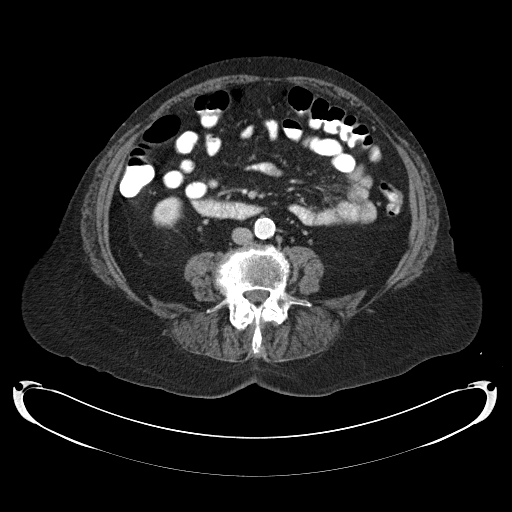
[im 52/90  bone]
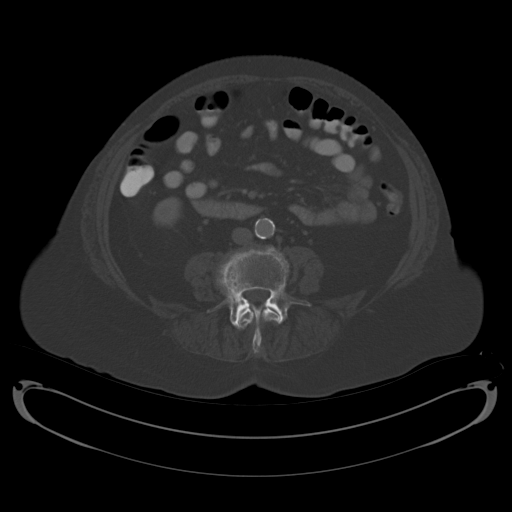
[im 61/90  soft-tissue]
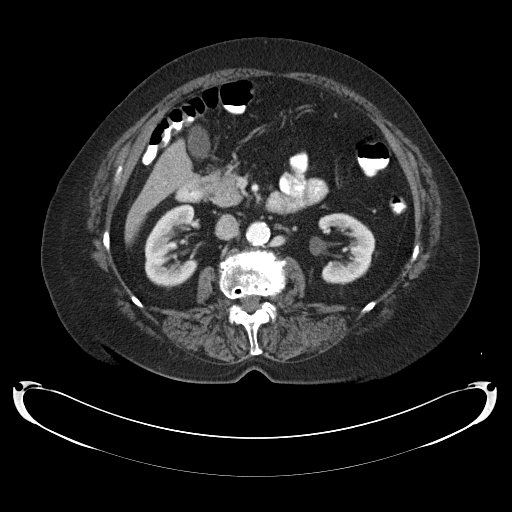
[im 66/90  soft-tissue]
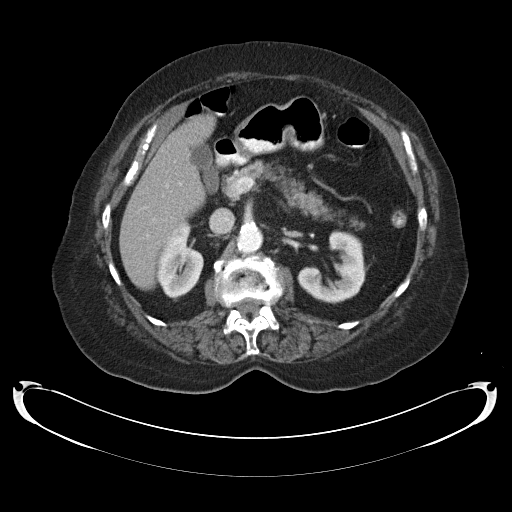
[im 71/90  soft-tissue]
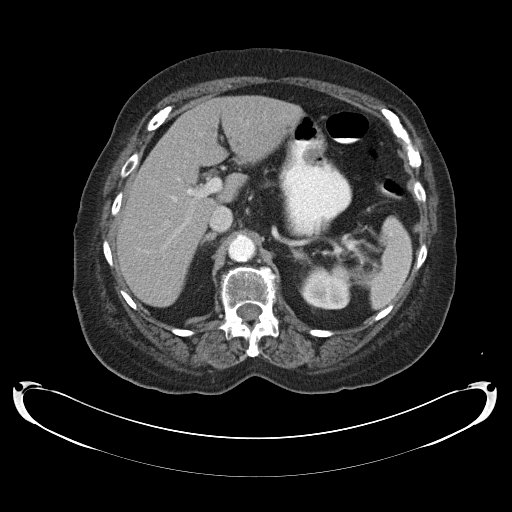
[im 80/90  soft-tissue]
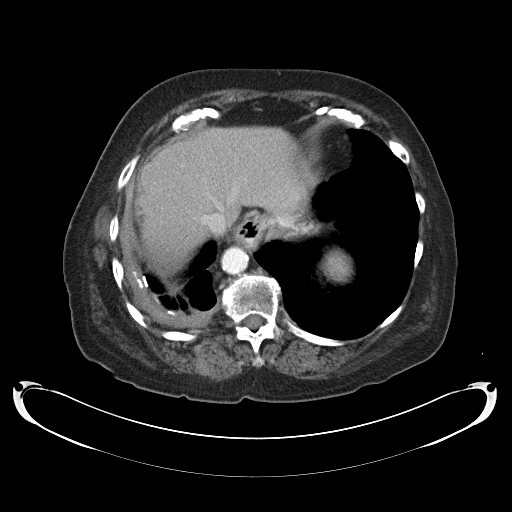
[im 85/90  soft-tissue]
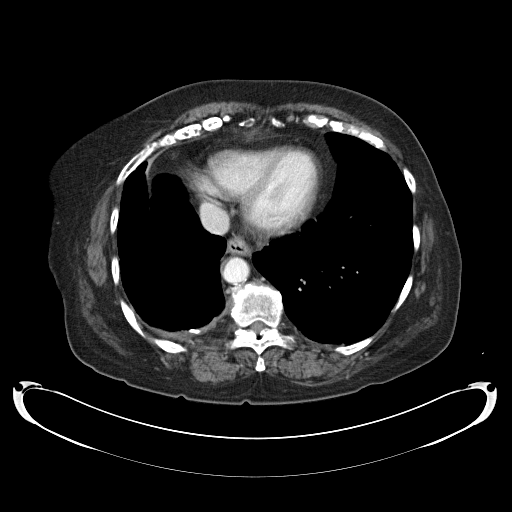

[Series 5: coronals · coronal · 0.90mm/px · 3 of 123 slices shown]
[im 41/123  soft-tissue]
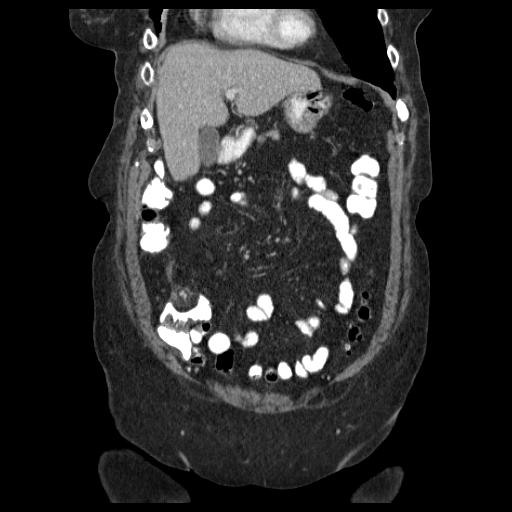
[im 55/123  soft-tissue]
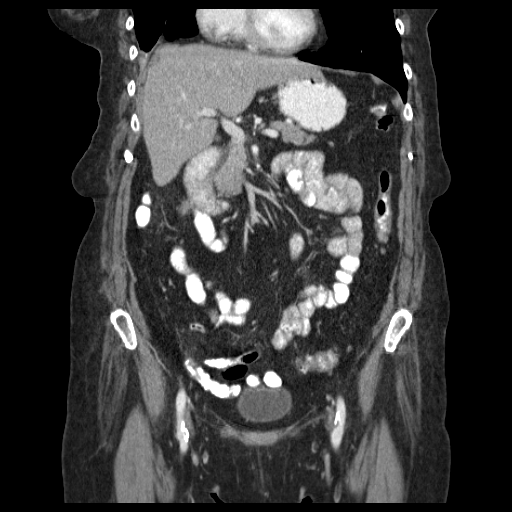
[im 68/123  soft-tissue]
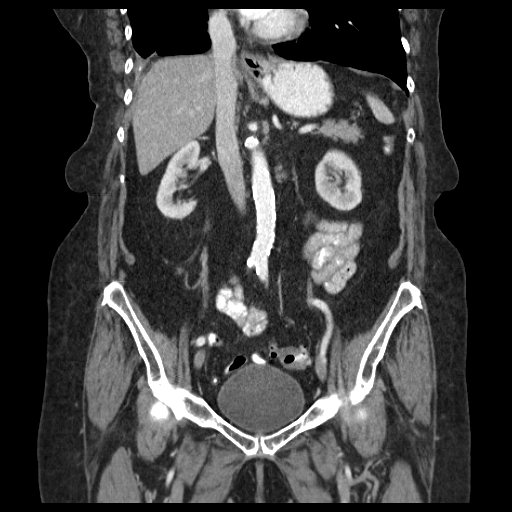

[17 of 46 positions shown; findings below may reference images not displayed]

FINDINGS: Calcified pleural plaque on the right. Right posterior
complex fluid versus pleural thickening, similar to prior.
Emphysematous changes.  Heart size within normal limits.  Small
hiatal hernia.

There are a couple hypodensities within the liver, nonspecific
however similar to prior.  Unremarkable spleen, biliary system,
pancreas, adrenal glands.

Symmetric renal enhancement.  No hydronephrosis or hydroureter

Colonic diverticulosis.  Normal appendix.  No bowel obstruction.
No free intraperitoneal air or fluid.  No lymphadenopathy.

There is scattered atherosclerotic calcification of the aorta and
its branches. No aneurysmal dilatation.  Retroaortic left renal
vein.

Thin-walled bladder.  Absent uterus.  No adnexal mass.

Multilevel degenerative changes of the imaged spine. No acute or
aggressive appearing osseous lesion.
IMPRESSION: Chronic pleural thickening or complex fluid along the periphery the
right hemithorax is similar to prior.  Emphysema.

Small hiatal hernia.

Colonic diverticulosis.  No CT evidence for diverticulitis.  No
bowel obstruction.

Advanced atherosclerotic disease.

## 2013-11-11 NOTE — Assessment & Plan Note (Signed)
Exacerbation controlled. Watch response of chronic bronchitis to spring pollen rhinitis present currently. Plan-flutter device for pulmonary toilet

## 2013-11-11 NOTE — Assessment & Plan Note (Signed)
No recurrence so far 

## 2013-11-11 NOTE — Assessment & Plan Note (Signed)
Plan-antihistamines

## 2013-11-17 ENCOUNTER — Other Ambulatory Visit: Payer: Self-pay | Admitting: Internal Medicine

## 2013-11-18 ENCOUNTER — Other Ambulatory Visit: Payer: Self-pay | Admitting: Internal Medicine

## 2013-11-18 NOTE — Telephone Encounter (Signed)
Last ov 09/25/2013 Med last filled 10/18/2013 #30

## 2013-11-18 NOTE — Telephone Encounter (Signed)
Refill ok as prev ordered today

## 2013-11-18 NOTE — Telephone Encounter (Signed)
[facility].../lmb 

## 2013-12-03 ENCOUNTER — Other Ambulatory Visit: Payer: Self-pay

## 2013-12-03 MED ORDER — ATORVASTATIN CALCIUM 40 MG PO TABS
40.0000 mg | ORAL_TABLET | ORAL | Status: DC
Start: 2013-12-03 — End: 2013-12-05

## 2013-12-03 NOTE — Telephone Encounter (Signed)
Pt called to get a rf on her lipitor. rx sent into pharmacy

## 2013-12-05 ENCOUNTER — Telehealth: Payer: Self-pay | Admitting: Internal Medicine

## 2013-12-05 MED ORDER — LOVASTATIN 20 MG PO TABS: 20.0000 mg | ORAL_TABLET | Freq: Every day | ORAL | Status: DC

## 2013-12-05 NOTE — Telephone Encounter (Signed)
Ok to take the lovastatin 20mg  qd - rx changed, please let us know when new rx refill is needed

## 2013-12-05 NOTE — Telephone Encounter (Signed)
Patient left message for Lucy.  She has a question about her cholesterol medication. Please call.

## 2013-12-05 NOTE — Telephone Encounter (Signed)
Called pt back she wanted to ask md what the difference between the generic lipitor & lovastatin. Her husband was taking lovastatin 20 mg, and she has a whole bottom left wanting to know if she can take...[name]

## 2013-12-06 NOTE — Telephone Encounter (Signed)
Called pt no answer LMOM (H) with md response...[name]

## 2013-12-09 ENCOUNTER — Telehealth: Payer: Self-pay | Admitting: *Deleted

## 2013-12-09 MED ORDER — LOVASTATIN 20 MG PO TABS: 20.0000 mg | ORAL_TABLET | Freq: Every day | ORAL | Status: DC

## 2013-12-09 NOTE — Telephone Encounter (Signed)
Left msg on triage requesting to speak with [name]. Called pt back wanting to make sure she can take the lovastatin inform pt of md response see phone note from Friday. [facility] no longer taking the generic lipitor...[name]

## 2013-12-30 ENCOUNTER — Ambulatory Visit (INDEPENDENT_AMBULATORY_CARE_PROVIDER_SITE_OTHER): Payer: Medicare Other | Admitting: Internal Medicine

## 2013-12-30 ENCOUNTER — Ambulatory Visit (INDEPENDENT_AMBULATORY_CARE_PROVIDER_SITE_OTHER)
Admission: RE | Admit: 2013-12-30 | Discharge: 2013-12-30 | Disposition: A | Discharge status: Home / Self Care | Payer: Medicare Other | Source: Ambulatory Visit | Attending: Internal Medicine | Admitting: Internal Medicine

## 2013-12-30 ENCOUNTER — Other Ambulatory Visit (INDEPENDENT_AMBULATORY_CARE_PROVIDER_SITE_OTHER): Payer: Medicare Other

## 2013-12-30 ENCOUNTER — Telehealth: Payer: Self-pay | Admitting: Internal Medicine

## 2013-12-30 ENCOUNTER — Telehealth: Payer: Self-pay

## 2013-12-30 ENCOUNTER — Encounter: Payer: Self-pay | Admitting: Internal Medicine

## 2013-12-30 VITALS — BP 138/78 | HR 69 | Ht 66.0 in | Wt 151.2 lb

## 2013-12-30 DIAGNOSIS — R109 Unspecified abdominal pain: Secondary | ICD-10-CM

## 2013-12-30 DIAGNOSIS — R52 Pain, unspecified: Secondary | ICD-10-CM

## 2013-12-30 LAB — BASIC METABOLIC PANEL
BUN: 7 mg/dL (ref 6–23)
CO2: 31 mEq/L (ref 19–32)
Calcium: 9.3 mg/dL (ref 8.4–10.5)
Chloride: 100 mEq/L (ref 96–112)
Creatinine, Ser: 0.6 mg/dL (ref 0.4–1.2)
GFR: 100.63 mL/min (ref >60.00)
Glucose, Bld: 85 mg/dL (ref 70–99)
POTASSIUM: 3.8 meq/L (ref 3.5–5.1)
SODIUM: 137 meq/L (ref 135–145)

## 2013-12-30 LAB — URINALYSIS, ROUTINE W REFLEX MICROSCOPIC
Bilirubin Urine: NEGATIVE
HGB URINE DIPSTICK: NEGATIVE
Ketones, ur: NEGATIVE
NITRITE: NEGATIVE
SPECIFIC GRAVITY, URINE: 1.01 (ref 1.000–1.030)
Total Protein, Urine: NEGATIVE
URINE GLUCOSE: NEGATIVE
Urobilinogen, UA: 0.2 (ref 0.0–1.0)
pH: 7 (ref 5.0–8.0)

## 2013-12-30 LAB — CBC WITH DIFFERENTIAL/PLATELET
BASOS PCT: 0.3 % (ref 0.0–3.0)
Basophils Absolute: 0 10*3/uL (ref 0.0–0.1)
EOS PCT: 0.5 % (ref 0.0–5.0)
Eosinophils Absolute: 0 10*3/uL (ref 0.0–0.7)
HCT: 38.5 % (ref 36.0–46.0)
HEMOGLOBIN: 12.6 g/dL (ref 12.0–15.0)
Lymphocytes Relative: 15.2 % (ref 12.0–46.0)
Lymphs Abs: 1 10*3/uL (ref 0.7–4.0)
MCHC: 32.6 g/dL (ref 30.0–36.0)
MCV: 91.8 fl (ref 78.0–100.0)
MONO ABS: 0.5 10*3/uL (ref 0.1–1.0)
MONOS PCT: 8.2 % (ref 3.0–12.0)
NEUTROS ABS: 5 10*3/uL (ref 1.4–7.7)
Neutrophils Relative %: 75.8 % (ref 43.0–77.0)
Platelets: 271 10*3/uL (ref 150.0–400.0)
RBC: 4.2 Mil/uL (ref 3.87–5.11)
RDW: 13.8 % (ref 11.5–15.5)
WBC: 6.7 10*3/uL (ref 4.0–10.5)

## 2013-12-30 MED ORDER — PROMETHAZINE HCL 25 MG PO TABS: 25.0000 mg | ORAL_TABLET | Freq: Four times a day (QID) | ORAL | Status: DC | PRN

## 2013-12-30 MED ORDER — KETOROLAC TROMETHAMINE 15 MG/ML IJ SOLN
15.0000 mg | Freq: Once | INTRAMUSCULAR | Status: AC
  Administered 2013-12-30: 15 mg via INTRAMUSCULAR

## 2013-12-30 MED ORDER — KETOROLAC TROMETHAMINE 15 MG/ML IJ SOLN: 15.0000 mg | Freq: Once | INTRAMUSCULAR | Status: DC

## 2013-12-30 NOTE — Progress Notes (Signed)
Pre visit review using our clinic review tool, if applicable. No additional management support is needed unless otherwise documented below in the visit note. 

## 2013-12-30 NOTE — Progress Notes (Signed)
Subjective:    Patient ID: [name], female    DOB: 02-08-1933, 80 y.o.   MRN: [id number]  Abdominal Pain Associated symptoms include dysuria and nausea (this AM due to pain). Pertinent negatives include no constipation, diarrhea, fever, hematuria or vomiting.    Patient is here for follow up  Reviewed chronic medical issues and interval medical events  Past Medical History  Diagnosis Date  . Allergic rhinitis   . COPD (chronic obstructive pulmonary disease)   . Pelvic cyst   . Bladder atony   . Unspecified hypothyroidism   . Hyperlipidemia   . Non-small cell carcinoma of lung, stage 1 2005    1.4 cm Poorly differentiated Squamous cell RUL  T1N0 resected 09/22/2003  . Arthritis     Review of Systems  Constitutional: Positive for chills. Negative for fever, fatigue and unexpected weight change.  Respiratory: Positive for shortness of breath (chronic).   Gastrointestinal: Positive for nausea (this AM due to pain) and abdominal pain (L flank x 3 days). Negative for vomiting, diarrhea, constipation and abdominal distention.  Genitourinary: Positive for dysuria and flank pain (left x 3 days). Negative for hematuria and difficulty urinating.       Objective:   Physical Exam  BP 138/78  Pulse 69  Ht 5\' 6"  (1.676 m)  Wt 151 lb 4 oz (68.607 kg)  BMI 24.42 kg/m2  SpO2 80% Wt Readings from Last 3 Encounters:  12/30/13 151 lb 4 oz (68.607 kg)  10/15/13 151 lb 3.2 oz (68.584 kg)  09/25/13 149 lb 6.4 oz (67.767 kg)   Constitutional: She appears well-developed and well-nourished. No distress. Dtr at side Neck: Normal range of motion. Neck supple. No JVD present. No thyromegaly present.  Cardiovascular: Normal rate, regular rhythm and normal heart sounds.  No murmur heard. No BLE edema. Pulmonary/Chest: Effort normal and breath sounds normal. No respiratory distress. She has no wheezes.  Mskel: mild R flank pain to palpation Psychiatric: She has a normal mood and affect.  Her behavior is normal. Judgment and thought content normal.   Lab Results  Component Value Date   WBC 7.9 04/10/2013   HGB 13.2 04/10/2013   HCT 39.6 04/10/2013   PLT 275.0 04/10/2013   GLUCOSE 102* 04/10/2013   CHOL 180 09/25/2013   TRIG 89.0 09/25/2013   HDL 73.10 09/25/2013   LDLDIRECT 152.8 04/10/2013   LDLCALC 89 09/25/2013   ALT 22 12/31/2012   AST 25 12/31/2012   NA 133* 04/10/2013   K 4.0 04/10/2013   CL 95* 04/10/2013   CREATININE 0.7 04/10/2013   BUN 9 04/10/2013   CO2 30 04/10/2013   TSH 0.36 09/25/2013   INR 0.94 02/05/2010   HGBA1C 5.2 02/22/2012    Dg Chest 2 View  07/26/2013   CLINICAL DATA:  Heart racing and shortness of breath  EXAM: CHEST  2 VIEW  COMPARISON:  12/31/2012 and 12/07/2010  FINDINGS: There is chronic volume loss and pleural thickening at the right lung base. Findings are related to postoperative changes. No evidence for airspace disease or edema. Heart and mediastinum are stable. Stable lucency in the upper lungs consistent with emphysema. Bony thorax is intact.  IMPRESSION: Chronic volume loss and pleural thickening in the right hemithorax. No acute chest findings.   Electronically Signed   By: Markus Daft M.D.   On: 07/26/2013 17:49       Assessment & Plan:   L flank pain - concern clincially for kidney stone vs PN Check  labs including UA tordol 15mg  IM today + po promethazine prn

## 2013-12-30 NOTE — Patient Instructions (Signed)
It was good to see you today.  We have reviewed your prior records including labs and tests today  Test(s) ordered today. Your results will be released to [place] (or called to you) after review, usually within 72hours after test completion. If any changes need to be made, you will be notified at that same time.  we'll make referral for CT scan today to look for ?kidney stone vs infection Our office will contact you regarding appointment(s) once made.  Medications reviewed and updated, no changes recommended at this time.  Generic Phenergan as needed for nausea symptoms sent to your pharmacy additional pain medication or antibiotics will be determined after review of labs and CT results

## 2013-12-30 NOTE — Telephone Encounter (Signed)
Patient Information:  Caller Name: De  Phone: [phone]  Patient: [name], [name]  Gender: Female  DOB: Nov 01, 1932  Age: 80 Years  PCP: Gwendolyn Grant (Adults only)  Office Follow Up:  Does the office need to follow up with this patient?: No  Instructions For The Office: N/A  RN Note:  Appt. scheduled for this pt. at the [place] office at 09:30am.  Symptoms  Reason For Call & Symptoms: Onset of pain in  her Left  side on 12/29/13. Thinks it is a kidney stone. C/o urgency. Having nausea because of the pain. No change in the color of the urine. No fever.  Reviewed Health History In EMR: Yes  Reviewed Medications In EMR: Yes  Reviewed Allergies In EMR: Yes  Reviewed Surgeries / Procedures: Yes  Date of Onset of Symptoms: 12/29/2013  Guideline(s) Used:  Urination Pain - Female  Disposition Per Guideline:   Go to Office Now  Reason For Disposition Reached:   Side (flank) or lower back pain present  Advice Given:  Call Back If:   You become worse.  Patient Will Follow Care Advice:  YES  Appointment Scheduled:  12/30/2013 09:30:00 Appointment Scheduled Provider:  Gwendolyn Grant (Adults only)

## 2013-12-30 NOTE — Telephone Encounter (Signed)
Called and informed pt and called pharm with rx information.   Cipro 250 mg PO BID x 5 days.

## 2013-12-31 ENCOUNTER — Other Ambulatory Visit: Payer: Medicare Other

## 2013-12-31 ENCOUNTER — Ambulatory Visit (HOSPITAL_COMMUNITY)
Admission: RE | Admit: 2013-12-31 | Discharge: 2013-12-31 | Disposition: A | Discharge status: Home / Self Care | Payer: Medicare Other | Source: Ambulatory Visit | Attending: Oncology | Admitting: Oncology

## 2013-12-31 DIAGNOSIS — C3491 Malignant neoplasm of unspecified part of right bronchus or lung: Secondary | ICD-10-CM

## 2013-12-31 DIAGNOSIS — Z85118 Personal history of other malignant neoplasm of bronchus and lung: Secondary | ICD-10-CM | POA: Insufficient documentation

## 2013-12-31 LAB — COMPREHENSIVE METABOLIC PANEL (CC13)
ALT: 22 U/L (ref 0–55)
AST: 25 U/L (ref 5–34)
Albumin: 3.5 g/dL (ref 3.5–5.0)
Alkaline Phosphatase: 119 U/L (ref 40–150)
Anion Gap: 9 mEq/L (ref 3–11)
BUN: 8.3 mg/dL (ref 7.0–26.0)
CO2: 29 mEq/L (ref 22–29)
Calcium: 10 mg/dL (ref 8.4–10.4)
Chloride: 101 mEq/L (ref 98–109)
Creatinine: 0.8 mg/dL (ref 0.6–1.1)
Glucose: 97 mg/dl (ref 70–140)
Potassium: 4.1 mEq/L (ref 3.5–5.1)
Sodium: 138 mEq/L (ref 136–145)
Total Bilirubin: 0.36 mg/dL (ref 0.20–1.20)
Total Protein: 7.5 g/dL (ref 6.4–8.3)

## 2014-01-01 ENCOUNTER — Telehealth: Payer: Self-pay | Admitting: Internal Medicine

## 2014-01-01 NOTE — Telephone Encounter (Signed)
Patient would like results mailed to her rather than sent through mychart.

## 2014-01-07 ENCOUNTER — Encounter: Payer: Self-pay | Admitting: Internal Medicine

## 2014-01-07 ENCOUNTER — Ambulatory Visit (HOSPITAL_BASED_OUTPATIENT_CLINIC_OR_DEPARTMENT_OTHER): Payer: Medicare Other | Admitting: Internal Medicine

## 2014-01-07 ENCOUNTER — Ambulatory Visit: Payer: Medicare Other | Admitting: Oncology

## 2014-01-07 VITALS — BP 124/46 | HR 82 | Temp 98.4°F | Resp 19 | Ht 66.0 in | Wt 153.3 lb

## 2014-01-07 DIAGNOSIS — Z85118 Personal history of other malignant neoplasm of bronchus and lung: Secondary | ICD-10-CM

## 2014-01-07 DIAGNOSIS — C3491 Malignant neoplasm of unspecified part of right bronchus or lung: Secondary | ICD-10-CM

## 2014-01-07 NOTE — Progress Notes (Signed)
[facility] [phone]   [phone]  OFFICE PROGRESS NOTE  Michelle Grant, MD [address] [facility] [address] [place] [employer] [place] [address]  DIAGNOSIS: Stage IA (T1a, N0, M0) non-small cell lung cancer, squamous cell carcinoma diagnosed in April 2005.  PRIOR THERAPY: Right upper lobectomy in April of 2005  CURRENT THERAPY: Observation.  INTERVAL HISTORY: [name] 80 y.o. female returns to the clinic today for followup visit accompanied by her husband. The patient is a former patient of Dr. Beryle Beams who is here today for evaluation of her history of lung cancer. The patient is feeling fine today with no specific complaints except for left flank pain and she was recently treated for urinary tract infection by her primary care physician. CT of the abdomen last week showed no  Significant abnormalities. The patient denied having any significant chest pain, shortness breath, cough or hemoptysis. She has no weight loss or night sweats. She has been observation for the last 10 years with no evidence for disease recurrence. She had repeat chest x-ray recently and she is here for evaluation and discussion of her imaging results.  MEDICAL HISTORY: Past Medical History  Diagnosis Date  . Allergic rhinitis   . COPD (chronic obstructive pulmonary disease)   . Pelvic cyst   . Bladder atony   . Unspecified hypothyroidism   . Hyperlipidemia   . Non-small cell carcinoma of lung, stage 1 2005    1.4 cm Poorly differentiated Squamous cell RUL  T1N0 resected 09/22/2003  . Arthritis     ALLERGIES:  is allergic to daliresp; tiotropium bromide monohydrate; ampicillin; azithromycin; codeine; fludrocortisone acetate; nitrofurantoin; penicillins; and sulfonamide derivatives.  MEDICATIONS:  Current Outpatient Prescriptions  Medication Sig Dispense Refill  . acetaminophen (TYLENOL ARTHRITIS PAIN) 650 MG CR tablet Take 650 mg by mouth as needed for  pain.      [name] ALPRAZolam (XANAX) 1 MG tablet TAKE 1 TABLET BY MOUTH AT BEDTIME AS NEEDED FOR SLEEP OR ANXIETY  30 tablet  5  . atorvastatin (LIPITOR) 40 MG tablet       . benzonatate (TESSALON) 100 MG capsule Take 1 capsule (100 mg total) by mouth every 6 (six) hours as needed for cough.  30 capsule  3  . levothyroxine (SYNTHROID, LEVOTHROID) 100 MCG tablet Take 1 tablet (100 mcg total) by mouth daily before breakfast.  90 tablet  1  . lovastatin (MEVACOR) 20 MG tablet Take 1 tablet (20 mg total) by mouth at bedtime.  90 tablet  3  . Multiple Vitamins-Minerals (CENTRUM SILVER PO) Take 1 tablet by mouth daily.       [name] QVAR 80 MCG/ACT inhaler INHALE 2 PUFFS INTO THE LUNGS 2 (TWO) TIMES DAILY.  8.7 g  5  . Respiratory Therapy Supplies (FLUTTER) DEVI Blow through 4 times per cycle and repeat 3 cycles per day  1 each  0  . levalbuterol (XOPENEX) 1.25 MG/3ML nebulizer solution TAKE 1.25 MG BY NEBULIZATION EVERY 8 (EIGHT) HOURS AS NEEDED. DX 496  216 mL  0  . promethazine (PHENERGAN) 25 MG tablet Take 1 tablet (25 mg total) by mouth every 6 (six) hours as needed for nausea or vomiting.  30 tablet  0   No current facility-administered medications for this visit.    SURGICAL HISTORY:  Past Surgical History  Procedure Laterality Date  . Lung lobectomy  2005    Right Upper Dr Arlyce Dice  . Abdominal hysterectomy    .  Spine surgery    . Needle aspiration pelvic cyst  2011    Dr Diona Fanti  . Cataract extraction      REVIEW OF SYSTEMS:  A comprehensive review of systems was negative except for: Genitourinary: positive for Left flank pain   PHYSICAL EXAMINATION: General appearance: alert, cooperative, fatigued and no distress Head: Normocephalic, without obvious abnormality, atraumatic Neck: no adenopathy, no JVD, supple, symmetrical, trachea midline and thyroid not enlarged, symmetric, no tenderness/mass/nodules Lymph nodes: Cervical, supraclavicular, and axillary nodes normal. Resp: clear to  auscultation bilaterally Back: symmetric, no curvature. ROM normal. No CVA tenderness. Cardio: regular rate and rhythm, S1, S2 normal, no murmur, click, rub or gallop GI: soft, non-tender; bowel sounds normal; no masses,  no organomegaly Extremities: extremities normal, atraumatic, no cyanosis or edema  ECOG PERFORMANCE STATUS: 1 - Symptomatic but completely ambulatory  Blood pressure 124/46, pulse 82, temperature 98.4 F (36.9 C), temperature source Oral, resp. rate 19, height 5\' 6"  (1.676 m), weight 153 lb 4.8 oz (69.536 kg), SpO2 95.00%.  LABORATORY DATA: Lab Results  Component Value Date   WBC 6.7 12/30/2013   HGB 12.6 12/30/2013   HCT 38.5 12/30/2013   MCV 91.8 12/30/2013   PLT 271.0 12/30/2013      Chemistry      Component Value Date/Time   NA 137 12/30/2013 1042   NA 138 12/31/2012 0956   NA 143 12/21/2010 1206   K 3.8 12/30/2013 1042   K 4.1 12/31/2012 0956   K 4.0 12/21/2010 1206   CL 100 12/30/2013 1042   CL 97* 12/21/2010 1206   CO2 31 12/30/2013 1042   CO2 29 12/31/2012 0956   CO2 29 12/21/2010 1206   BUN 7 12/30/2013 1042   BUN 8.3 12/31/2012 0956   BUN 10 12/21/2010 1206   CREATININE 0.6 12/30/2013 1042   CREATININE 0.8 12/31/2012 0956   CREATININE 0.9 12/21/2010 1206      Component Value Date/Time   CALCIUM 9.3 12/30/2013 1042   CALCIUM 10.0 12/31/2012 0956   CALCIUM 9.4 12/21/2010 1206   CALCIUM 9.9 06/29/2010 1628   ALKPHOS 119 12/31/2012 0956   ALKPHOS 92 04/06/2012 1100   ALKPHOS 74 12/21/2010 1206   AST 25 12/31/2012 0956   AST 31 04/06/2012 1100   AST 34 12/21/2010 1206   ALT 22 12/31/2012 0956   ALT 18 04/06/2012 1100   ALT 22 12/21/2010 1206   BILITOT 0.36 12/31/2012 0956   BILITOT 0.7 04/06/2012 1100   BILITOT 0.70 12/21/2010 1206       RADIOGRAPHIC STUDIES: Ct Abdomen Pelvis Wo Contrast  12/30/2013   CLINICAL DATA:  Left flank and groin pain for 3 days.  Nausea.  EXAM: CT ABDOMEN AND PELVIS WITHOUT CONTRAST  TECHNIQUE: Multidetector CT imaging of the abdomen and pelvis  was performed following the standard protocol without IV contrast.  COMPARISON:  04/06/2012  FINDINGS: Stable degree of right basilar pleural thickening with pleural calcification likely indicating asbestos exposure. Curvilinear bilateral lower lobe scarring or atelectasis again noted.  Moderate atheromatous aortic calcification without aneurysm. No radiopaque renal, ureteral, or bladder calculus is identified. The bladder is unremarkable. No hydroureteronephrosis.  Unenhanced liver, adrenal glands, pancreas, and spleen are unremarkable. A few dependent presumed gallstones are reidentified without other evidence for acute cholecystitis. No ascites or lymphadenopathy. Retro aortic left renal vein incidentally noted. No ascites, free air, or lymphadenopathy is identified.  Normal appendix. Uterus and ovaries not visualized, presumably surgically absent, without adnexal mass.  Colonic diverticuli noted without  evidence for diverticulitis. No bowel wall thickening or focal segmental dilatation.  Mild right third curvature of the thoracolumbar spine noted. Mild multilevel disc degenerative change is noted. No compression fracture. No acute osseous abnormality.  IMPRESSION: No acute intra-abdominal or pelvic pathology.   Electronically Signed   By: Conchita Paris M.D.   On: 12/30/2013 14:34   Dg Chest 2 View  12/31/2013   CLINICAL DATA:  History of resected lung cancer  EXAM: CHEST  2 VIEW  COMPARISON:  07/26/2013  FINDINGS: Scarring is again noted in the right lung base. Mild volume loss is noted in the right lung the left lung is well aerated and clear. No acute bony abnormality is seen. The cardiac shadow is within normal limits.  IMPRESSION: No change from the prior exam.  No acute abnormality seen.   Electronically Signed   By: Inez Catalina M.D.   On: 12/31/2013 14:48    ASSESSMENT AND PLAN: this is a very pleasant 80 years old white female with history of stage IA non-small cell lung cancer diagnosed in April  2005 status post right upper lobectomy and has been observation since that time was no evidence for disease recurrence. I discussed the imaging results with the patient and her husband today. I recommended for her to continue routine followup visit with her primary care physician at this point. I don't see a need for the patient to continue regular visit at the [facility] after 10 years from her surgical resection with no evidence for recurrence. The patient may benefit from annual chest x-ray with her annual physical exam by her primary care physician. I would be happy to see you in the future if needed. The patient voices understanding of current disease status and treatment options and is in agreement with the current care plan.  All questions were answered. The patient knows to call the clinic with any problems, questions or concerns. We can certainly see the patient much sooner if necessary.  Disclaimer: This note was dictated with voice recognition software. Similar sounding words can inadvertently be transcribed and may not be corrected upon review.

## 2014-01-15 ENCOUNTER — Telehealth: Payer: Self-pay | Admitting: *Deleted

## 2014-01-15 DIAGNOSIS — R109 Unspecified abdominal pain: Secondary | ICD-10-CM

## 2014-01-15 NOTE — Telephone Encounter (Signed)
Check UA (order done) and will send antibiotics if abn UA- If no UTI on UA, should see Dr Lynann Bologna as planned for back pain thanks

## 2014-01-15 NOTE — Telephone Encounter (Signed)
Called pt x's 2 no answer LMOM RTC...[name]

## 2014-01-15 NOTE — Telephone Encounter (Signed)
Pt stated she is still having the same sxs she was having on 7/22 with burning when urinate & back pain. MD thought she had a kidney stone but the scan was negative. Pt stated the burning started bck yesterday. Wanting to know does md want her to take another round of the cipro, or to contact Dr. Lynann Bologna concerning the pain in her back...[name]

## 2014-01-16 ENCOUNTER — Other Ambulatory Visit (INDEPENDENT_AMBULATORY_CARE_PROVIDER_SITE_OTHER): Payer: Medicare Other

## 2014-01-16 DIAGNOSIS — R109 Unspecified abdominal pain: Secondary | ICD-10-CM

## 2014-01-16 NOTE — Telephone Encounter (Signed)
Notified pt with md response.../lmb 

## 2014-01-17 ENCOUNTER — Telehealth: Payer: Self-pay | Admitting: Internal Medicine

## 2014-01-17 LAB — URINALYSIS, ROUTINE W REFLEX MICROSCOPIC
BILIRUBIN URINE: NEGATIVE
HGB URINE DIPSTICK: NEGATIVE
Ketones, ur: NEGATIVE
Leukocytes, UA: NEGATIVE
NITRITE: NEGATIVE
PH: 7.5 (ref 5.0–8.0)
RBC / HPF: NONE SEEN (ref 0–?)
Specific Gravity, Urine: <=1.005 — AB (ref 1.000–1.030)
Total Protein, Urine: NEGATIVE
Urine Glucose: NEGATIVE
Urobilinogen, UA: 0.2 (ref 0.0–1.0)
WBC, UA: NONE SEEN (ref 0–?)

## 2014-01-17 NOTE — Telephone Encounter (Signed)
Pt called to check on the lab result. Please call pt Asap when we get it.

## 2014-01-20 NOTE — Telephone Encounter (Signed)
UA clear - no UTI Pt should follow up with Dr. Lynann Bologna for back pain - or OV with Dr Tamala Julian here thanks

## 2014-01-20 NOTE — Telephone Encounter (Signed)
Notified pt with md response.../lmb 

## 2014-01-24 ENCOUNTER — Telehealth: Payer: Self-pay | Admitting: *Deleted

## 2014-01-24 NOTE — Telephone Encounter (Signed)
Left msg triage have a sore/itchy throat. Wanting recommendation on home remedy to use...[name]

## 2014-01-24 NOTE — Telephone Encounter (Signed)
Notified pt with md response.../lmb 

## 2014-01-24 NOTE — Telephone Encounter (Signed)
Zicam Melts or Zinc lozenges as per package label for scratchy throat . Complementary options include  vitamin C 2000 mg daily; & Echinacea for 4-7 days. Report persistent or progressive fever; discolored nasal or chest secretions; or frontal headache or facial  pain.

## 2014-02-07 ENCOUNTER — Other Ambulatory Visit: Payer: Self-pay | Admitting: Internal Medicine

## 2014-02-12 ENCOUNTER — Ambulatory Visit: Payer: Medicare Other | Admitting: Internal Medicine

## 2014-02-12 ENCOUNTER — Ambulatory Visit (INDEPENDENT_AMBULATORY_CARE_PROVIDER_SITE_OTHER): Payer: Medicare Other | Admitting: Family Medicine

## 2014-02-12 VITALS — BP 128/62 | HR 87 | Temp 98.0°F | Resp 16 | Ht 66.0 in | Wt 153.0 lb

## 2014-02-12 DIAGNOSIS — B353 Tinea pedis: Secondary | ICD-10-CM

## 2014-02-12 MED ORDER — TERBINAFINE HCL 250 MG PO TABS: 250.0000 mg | ORAL_TABLET | Freq: Every day | ORAL | Status: DC

## 2014-02-12 MED ORDER — DOXYCYCLINE HYCLATE 100 MG PO CAPS: 100.0000 mg | ORAL_CAPSULE | Freq: Two times a day (BID) | ORAL | Status: DC

## 2014-02-12 NOTE — Progress Notes (Signed)
[facility] [address], [place] [address] [phone]  Date:  02/12/2014   Name:  [name]   DOB:  [birth year]   MRN:  [id number]  PCP:  Gwendolyn Grant, MD    Chief Complaint: redness on top of right foot   History of Present Illness:  [name] is a 80 y.o. very pleasant female patient who presents with the following:  She is here today with a concern regarding her right foot.  She had noted a splint in the skin between her toes.  She tried an antifungal, cortisone, neosporin but nothing seemed to help.  The redness seemed to spread over the top of her foot and this am it seemed swollen.    Overall she feels ok, no fever.    Patient Active Problem List   Diagnosis Date Noted  . Anxiety state, unspecified   . Bladder prolapse, female, acquired   . Arthritis   . Non-small cell carcinoma of lung, stage 1 01/10/2012  . Thrush 02/03/2011  . Neuropathy, peripheral 01/28/2011  . Asthma exacerbation in COPD 01/14/2011  . COPD (chronic obstructive pulmonary disease) 09/08/2010  . Dyslipidemia   . ATONY OF BLADDER 08/10/2009  . HYPERGLYCEMIA, BORDERLINE 10/29/2008  . HYPOTHYROIDISM 03/12/2008  . Seasonal allergic rhinitis 10/10/2007    Past Medical History  Diagnosis Date  . Allergic rhinitis   . COPD (chronic obstructive pulmonary disease)   . Pelvic cyst   . Bladder atony   . Unspecified hypothyroidism   . Hyperlipidemia   . Non-small cell carcinoma of lung, stage 1 2005    1.4 cm Poorly differentiated Squamous cell RUL  T1N0 resected 09/22/2003  . Arthritis   . Asthma   . Emphysema of lung     Past Surgical History  Procedure Laterality Date  . Lung lobectomy  2005    Right Upper Dr Arlyce Dice  . Abdominal hysterectomy    . Spine surgery    . Needle aspiration pelvic cyst  2011    Dr Diona Fanti  . Cataract extraction      History  Substance Use Topics  . Smoking status: Former Research scientist (life sciences)  . Smokeless tobacco: Not on file      Comment: Quit 2001, Smoked x 42 yrs - 1 PPD  . Alcohol Use: No    Family History  Problem Relation Age of Onset  . Heart disease Father   . Rheumatologic disease Sister   . Rheumatologic disease Brother   . Cancer Brother     Leukemia    Allergies  Allergen Reactions  . Daliresp [Roflumilast] Nausea Only  . Tiotropium Bromide Monohydrate     Urinary retention  . Ampicillin     REACTION: GI upset  . Azithromycin     REACTION: diarrhea  . Codeine   . Fludrocortisone Acetate Itching  . Nitrofurantoin     REACTION: Upset GI  . Penicillins   . Sulfonamide Derivatives     Medication list has been reviewed and updated.  Current Outpatient Prescriptions on File Prior to Visit  Medication Sig Dispense Refill  . acetaminophen (TYLENOL ARTHRITIS PAIN) 650 MG CR tablet Take 650 mg by mouth as needed for pain.      [name] ALPRAZolam (XANAX) 1 MG tablet TAKE 1 TABLET BY MOUTH AT BEDTIME AS NEEDED FOR SLEEP OR ANXIETY  30 tablet  5  . atorvastatin (LIPITOR) 40 MG tablet       . levalbuterol (XOPENEX) 1.25 MG/3ML nebulizer  solution TAKE 1.25 (1 VIAL) MG BY NEBULIZATION EVERY 8 (EIGHT) HOURS AS NEEDED. DX 496  144 mL  1  . levothyroxine (SYNTHROID, LEVOTHROID) 100 MCG tablet Take 1 tablet (100 mcg total) by mouth daily before breakfast.  90 tablet  1  . lovastatin (MEVACOR) 20 MG tablet Take 1 tablet (20 mg total) by mouth at bedtime.  90 tablet  3  . Multiple Vitamins-Minerals (CENTRUM SILVER PO) Take 1 tablet by mouth daily.       [name] QVAR 80 MCG/ACT inhaler INHALE 2 PUFFS INTO THE LUNGS 2 (TWO) TIMES DAILY.  8.7 g  5  . Respiratory Therapy Supplies (FLUTTER) DEVI Blow through 4 times per cycle and repeat 3 cycles per day  1 each  0  . benzonatate (TESSALON) 100 MG capsule Take 1 capsule (100 mg total) by mouth every 6 (six) hours as needed for cough.  30 capsule  3  . promethazine (PHENERGAN) 25 MG tablet Take 1 tablet (25 mg total) by mouth every 6 (six) hours as needed for nausea or  vomiting.  30 tablet  0   No current facility-administered medications on file prior to visit.    Review of Systems:  As per HPI- otherwise negative.   Physical Examination: Filed Vitals:   02/12/14 1423  BP: 128/62  Pulse: 87  Temp: 98 F (36.7 C)  Resp: 16   Filed Vitals:   02/12/14 1423  Height: 5\' 6"  (1.676 m)  Weight: 153 lb (69.4 kg)   Body mass index is 24.71 kg/(m^2). Ideal Body Weight: Weight in (lb) to have BMI = 25: 154.6  GEN: WDWN, NAD, Non-toxic, A & O x 3, looks well HEENT: Atraumatic, Normocephalic. Neck supple. No masses, No LAD. Ears and Nose: No external deformity. CV: RRR, No M/G/R. No JVD. No thrill. No extra heart sounds. PULM: CTA B, no wheezes, crackles, rhonchi. No retractions. No resp. distress. No accessory muscle use. EXTR: No c/c/e NEURO Normal gait.  PSYCH: Normally interactive. Conversant. Not depressed or anxious appearing.  Calm demeanor.  Right foot: between the 4th and 5th toes she has moist, cracked skin. Feet have good pulses but show signs of poor venous return- shiny skin, blue tinge to toes bilaterally    Assessment and Plan: Tinea pedis of right foot - Plan: doxycycline (VIBRAMYCIN) 100 MG capsule, terbinafine (LAMISIL) 250 MG tablet  Fungal infection with mild bacterial superinfection. lamisil for 2 weeks (avoid taking phenergan with this) and doxycycline as tolerated.  Follow-up if not better soon- Sooner if worse.      Signed Lamar Blinks, MD

## 2014-02-12 NOTE — Patient Instructions (Signed)
We are going to treat you for a fungal infection between your toes that seems to have led to a bacterial infection as well.  For the fungal infection take the lamisil (terbinafine) once a day for 2 weeks. You can continue to use an anti-fungal cream as needed.   For bacteria we are going to use doxycycline. If you can, take this twice a day.  If this bothers your stomach try just once a day.   Let me know if you are not better in the next few days- Sooner if worse.

## 2014-02-14 ENCOUNTER — Telehealth: Payer: Self-pay

## 2014-02-14 NOTE — Telephone Encounter (Signed)
Advised pt to take this medication with food.

## 2014-02-14 NOTE — Telephone Encounter (Signed)
PA was needed for terbinafine. Completed on phone and received approval through 05/15/14, case # [id number]. Notified pharm.

## 2014-02-14 NOTE — Telephone Encounter (Signed)
Patient is stating that the antibiotic is making her nauseous and she is needing to talk to someone.  Patient states Dr Lorelei Pont told her not to mix the antibiotic with her nausea medicine she has at home.   Best#: [phone]

## 2014-03-29 ENCOUNTER — Emergency Department (HOSPITAL_COMMUNITY): Payer: Medicare Other

## 2014-03-29 ENCOUNTER — Encounter (HOSPITAL_COMMUNITY): Payer: Self-pay | Admitting: Emergency Medicine

## 2014-03-29 ENCOUNTER — Emergency Department (HOSPITAL_COMMUNITY)
Admission: EM | Admit: 2014-03-29 | Discharge: 2014-03-29 | Disposition: A | Discharge status: Home / Self Care | Payer: Medicare Other | Attending: Emergency Medicine | Admitting: Emergency Medicine

## 2014-03-29 DIAGNOSIS — R079 Chest pain, unspecified: Secondary | ICD-10-CM | POA: Diagnosis not present

## 2014-03-29 DIAGNOSIS — E039 Hypothyroidism, unspecified: Secondary | ICD-10-CM | POA: Insufficient documentation

## 2014-03-29 DIAGNOSIS — E785 Hyperlipidemia, unspecified: Secondary | ICD-10-CM | POA: Diagnosis not present

## 2014-03-29 DIAGNOSIS — Z7951 Long term (current) use of inhaled steroids: Secondary | ICD-10-CM | POA: Diagnosis not present

## 2014-03-29 DIAGNOSIS — R0602 Shortness of breath: Secondary | ICD-10-CM

## 2014-03-29 DIAGNOSIS — Z880 Allergy status to penicillin: Secondary | ICD-10-CM | POA: Insufficient documentation

## 2014-03-29 DIAGNOSIS — Z87448 Personal history of other diseases of urinary system: Secondary | ICD-10-CM | POA: Insufficient documentation

## 2014-03-29 DIAGNOSIS — M546 Pain in thoracic spine: Secondary | ICD-10-CM | POA: Insufficient documentation

## 2014-03-29 DIAGNOSIS — M549 Dorsalgia, unspecified: Secondary | ICD-10-CM

## 2014-03-29 DIAGNOSIS — Z7982 Long term (current) use of aspirin: Secondary | ICD-10-CM | POA: Diagnosis not present

## 2014-03-29 DIAGNOSIS — M1990 Unspecified osteoarthritis, unspecified site: Secondary | ICD-10-CM | POA: Diagnosis not present

## 2014-03-29 DIAGNOSIS — Z85118 Personal history of other malignant neoplasm of bronchus and lung: Secondary | ICD-10-CM | POA: Diagnosis not present

## 2014-03-29 DIAGNOSIS — J441 Chronic obstructive pulmonary disease with (acute) exacerbation: Secondary | ICD-10-CM | POA: Insufficient documentation

## 2014-03-29 DIAGNOSIS — Z87891 Personal history of nicotine dependence: Secondary | ICD-10-CM | POA: Insufficient documentation

## 2014-03-29 LAB — BASIC METABOLIC PANEL
Anion gap: 10 (ref 5–15)
BUN: 5 mg/dL — AB (ref 6–23)
CO2: 30 mEq/L (ref 19–32)
CREATININE: 0.72 mg/dL (ref 0.50–1.10)
Calcium: 9.7 mg/dL (ref 8.4–10.5)
Chloride: 98 mEq/L (ref 96–112)
GFR, EST NON AFRICAN AMERICAN: 80 mL/min — AB (ref >90)
GLUCOSE: 97 mg/dL (ref 70–99)
Potassium: 4.2 mEq/L (ref 3.7–5.3)
Sodium: 138 mEq/L (ref 137–147)

## 2014-03-29 LAB — I-STAT TROPONIN, ED: Troponin i, poc: 0 ng/mL (ref 0.00–0.08)

## 2014-03-29 LAB — CBC
HEMATOCRIT: 38.7 % (ref 36.0–46.0)
Hemoglobin: 12.8 g/dL (ref 12.0–15.0)
MCH: 30.7 pg (ref 26.0–34.0)
MCHC: 33.1 g/dL (ref 30.0–36.0)
MCV: 92.8 fL (ref 78.0–100.0)
Platelets: 217 10*3/uL (ref 150–400)
RBC: 4.17 MIL/uL (ref 3.87–5.11)
RDW: 12.8 % (ref 11.5–15.5)
WBC: 6.2 10*3/uL (ref 4.0–10.5)

## 2014-03-29 LAB — D-DIMER, QUANTITATIVE: D-Dimer, Quant: <0.27 ug/mL-FEU (ref 0.00–0.48)

## 2014-03-29 MED ORDER — PREDNISONE 20 MG PO TABS
60.0000 mg | ORAL_TABLET | Freq: Once | ORAL | Status: AC
  Administered 2014-03-29: 60 mg via ORAL
  Filled 2014-03-29: qty 3

## 2014-03-29 MED ORDER — ALBUTEROL SULFATE (2.5 MG/3ML) 0.083% IN NEBU
5.0000 mg | INHALATION_SOLUTION | Freq: Once | RESPIRATORY_TRACT | Status: AC
  Administered 2014-03-29: 5 mg via RESPIRATORY_TRACT
  Filled 2014-03-29: qty 6

## 2014-03-29 MED ORDER — MORPHINE SULFATE 4 MG/ML IJ SOLN
4.0000 mg | Freq: Once | INTRAMUSCULAR | Status: AC
  Administered 2014-03-29: 4 mg via INTRAVENOUS
  Filled 2014-03-29: qty 1

## 2014-03-29 MED ORDER — ONDANSETRON HCL 4 MG/2ML IJ SOLN
4.0000 mg | Freq: Once | INTRAMUSCULAR | Status: AC
  Administered 2014-03-29: 4 mg via INTRAVENOUS
  Filled 2014-03-29: qty 2

## 2014-03-29 MED ORDER — IPRATROPIUM BROMIDE 0.02 % IN SOLN
0.5000 mg | Freq: Once | RESPIRATORY_TRACT | Status: AC
Start: 2014-03-29 — End: 2014-03-29
  Administered 2014-03-29: 0.5 mg via RESPIRATORY_TRACT
  Filled 2014-03-29: qty 2.5

## 2014-03-29 MED ORDER — ASPIRIN 81 MG PO CHEW
324.0000 mg | CHEWABLE_TABLET | Freq: Once | ORAL | Status: AC
  Administered 2014-03-29: 324 mg via ORAL
  Filled 2014-03-29: qty 4

## 2014-03-29 NOTE — ED Provider Notes (Signed)
CSN: [id number]     Arrival date & time 03/29/14  0957 History   First MD Initiated Contact with Patient 03/29/14 1050     Chief Complaint  Patient presents with  . Chest Pain  . Back Pain  . Arm Pain     (Consider location/radiation/quality/duration/timing/severity/associated sxs/prior Treatment) HPI Comments: Patient with a history of COPD and Lung Cancer presents today with a chief complaint of chest pain.  She reports that the pain is located left anterior chest and has been constant for the past 2 days.  Pain radiates to the left scapula.  She took two baby ASA yesterday, but does not feel that it helped the pain.  She reports that the pain is worse with palpation.  She reports that earlier today at 6 AM she began having pain and numbness of her left arm when she woke up from sleep.  She reports associated productive cough and congestion over the past two days.  She denies nausea, vomiting, abdominal pain, dizziness, or syncope.  Denies LE edema.  She denies prior cardiac history.  Denies history of PE or DVT.  Denies prolonged travel or surgeries in the past 4 weeks.  She is not on any estrogen containing medications.  She does have a history of Non-small Cell Carcinoma of the Lung and had right upper lobectomy back in 2005.  She reports that she recently saw Dr. Inda Merlin with Oncology.  Review of his note shows that the patient has not had any evidence of reoccurrence of Cancer and no longer needs to be followed up in the [facility].   The history is provided by the patient.    Past Medical History  Diagnosis Date  . Allergic rhinitis   . COPD (chronic obstructive pulmonary disease)   . Pelvic cyst   . Bladder atony   . Unspecified hypothyroidism   . Hyperlipidemia   . Non-small cell carcinoma of lung, stage 1 2005    1.4 cm Poorly differentiated Squamous cell RUL  T1N0 resected 09/22/2003  . Arthritis   . Asthma   . Emphysema of lung    Past Surgical History  Procedure  Laterality Date  . Lung lobectomy  2005    Right Upper Dr Arlyce Dice  . Abdominal hysterectomy    . Spine surgery    . Needle aspiration pelvic cyst  2011    Dr Diona Fanti  . Cataract extraction     Family History  Problem Relation Age of Onset  . Heart disease Father   . Rheumatologic disease Sister   . Rheumatologic disease Brother   . Cancer Brother     Leukemia   History  Substance Use Topics  . Smoking status: Former Research scientist (life sciences)  . Smokeless tobacco: Not on file     Comment: Quit 2001, Smoked x 42 yrs - 1 PPD  . Alcohol Use: No   OB History   Grav Para Term Preterm Abortions TAB SAB Ect Mult Living                 Review of Systems  All other systems reviewed and are negative.     Allergies  Daliresp; Tiotropium bromide monohydrate; Ampicillin; Azithromycin; Codeine; Fludrocortisone acetate; Nitrofurantoin; Penicillins; and Sulfonamide derivatives  Home Medications   Prior to Admission medications   Medication Sig Start Date End Date Taking? Authorizing Provider  acetaminophen (TYLENOL ARTHRITIS PAIN) 650 MG CR tablet Take 650 mg by mouth as needed for pain.   Yes Historical Provider, MD  ALPRAZolam (XANAX) 1 MG tablet Take 1 mg by mouth at bedtime as needed for anxiety or sleep.   Yes Historical Provider, MD  aspirin EC 81 MG tablet Take 162 mg by mouth once.   Yes Historical Provider, MD  beclomethasone (QVAR) 80 MCG/ACT inhaler Inhale 2 puffs into the lungs 2 (two) times daily.   Yes Historical Provider, MD  benzonatate (TESSALON) 100 MG capsule Take 1 capsule (100 mg total) by mouth every 6 (six) hours as needed for cough. 07/04/13  Yes Deneise Lever, MD  levalbuterol (XOPENEX) 1.25 MG/3ML nebulizer solution Take 1.25 mg by nebulization every 8 (eight) hours as needed for wheezing or shortness of breath.   Yes Historical Provider, MD  levothyroxine (SYNTHROID, LEVOTHROID) 100 MCG tablet Take 1 tablet (100 mcg total) by mouth daily before breakfast. 09/25/13  Yes Rowe Clack, MD  lovastatin (MEVACOR) 20 MG tablet Take 1 tablet (20 mg total) by mouth at bedtime. 12/09/13  Yes Rowe Clack, MD  Multiple Vitamins-Minerals (CENTRUM SILVER PO) Take 1 tablet by mouth daily.    Yes Historical Provider, MD  promethazine (PHENERGAN) 25 MG tablet Take 1 tablet (25 mg total) by mouth every 6 (six) hours as needed for nausea or vomiting. 12/30/13  Yes Rowe Clack, MD  Pseudoephedrine-DM-GG-APAP ([employer] COLD & COUGH PO) Take 2 capsules by mouth once.   Yes Historical Provider, MD  Respiratory Therapy Supplies (FLUTTER) DEVI Blow through 4 times per cycle and repeat 3 cycles per day 10/15/13   Deneise Lever, MD   BP 177/77  Pulse 83  Temp(Src) 97.7 F (36.5 C) (Oral)  Resp 20  SpO2 100% Physical Exam  Nursing note and vitals reviewed. Constitutional: She appears well-developed and well-nourished.  HENT:  Head: Normocephalic and atraumatic.  Mouth/Throat: Oropharynx is clear and moist.  Neck: Normal range of motion. Neck supple.  Cardiovascular: Normal rate, regular rhythm, normal heart sounds and intact distal pulses.   Pulmonary/Chest: Effort normal. No respiratory distress. She has wheezes. She exhibits tenderness.  Abdominal: Soft. Bowel sounds are normal. She exhibits no distension and no mass. There is no tenderness. There is no rebound.  Musculoskeletal: Normal range of motion.       Arms: No LE edema bilaterally  Neurological: She is alert.  Skin: Skin is warm and dry.  Psychiatric: She has a normal mood and affect.    ED Course  Procedures (including critical care time) Labs Review Labs Reviewed  BASIC METABOLIC PANEL - Abnormal; Notable for the following:    BUN 5 (*)    GFR calc non Af Amer 80 (*)    All other components within normal limits  CBC  I-STAT TROPOININ, ED    Imaging Review No results found.   EKG Interpretation   Date/Time:  Saturday March 29 2014 13:29:18 EDT Ventricular Rate:  85 PR Interval:  138 QRS  Duration: 80 QT Interval:  369 QTC Calculation: 439 R Axis:   61 Text Interpretation:  Sinus rhythm Confirmed by ZACKOWSKI  MD, SCOTT  ([id number]) on 03/29/2014 1:38:38 PM      MDM   Final diagnoses:  None   Patient presents with productive cough and chest pain that has been constant for the past 2 days.  EKG unremarkable.  Troponin negative.  Labs unremarkable.  No acute findings on CXR.  D-dimer is negative.  Symptoms improved after given breathing treatments.   Symptoms most consistent with Bronchitis or COPD exacerbation.  Unlikely cardiac etiology due  to the duration of symptoms and the normal troponin and EKG.  Patient stable for discharge.  Return precautions given.   Hyman Bible, PA-C 03/31/14 2215

## 2014-03-29 NOTE — ED Provider Notes (Addendum)
Medical screening examination/treatment/procedure(s) were conducted as a shared visit with non-physician practitioner(s) and myself.  I personally evaluated the patient during the encounter.   EKG Interpretation   Date/Time:  Saturday March 29 2014 13:29:18 EDT Ventricular Rate:  85 PR Interval:  138 QRS Duration: 80 QT Interval:  369 QTC Calculation: 439 R Axis:   61 Text Interpretation:  Sinus rhythm Confirmed by Koichi Platte  MD, Rocklin Soderquist  ([id number]) on 03/29/2014 1:38:38 PM     EKG pending.  Results for orders placed during the hospital encounter of 03/29/14  CBC      Result Value Ref Range   WBC 6.2  4.0 - 10.5 K/uL   RBC 4.17  3.87 - 5.11 MIL/uL   Hemoglobin 12.8  12.0 - 15.0 g/dL   HCT 38.7  36.0 - 46.0 %   MCV 92.8  78.0 - 100.0 fL   MCH 30.7  26.0 - 34.0 pg   MCHC 33.1  30.0 - 36.0 g/dL   RDW 12.8  11.5 - 15.5 %   Platelets 217  150 - 400 K/uL  BASIC METABOLIC PANEL      Result Value Ref Range   Sodium 138  137 - 147 mEq/L   Potassium 4.2  3.7 - 5.3 mEq/L   Chloride 98  96 - 112 mEq/L   CO2 30  19 - 32 mEq/L   Glucose, Bld 97  70 - 99 mg/dL   BUN 5 (*) 6 - 23 mg/dL   Creatinine, Ser 0.72  0.50 - 1.10 mg/dL   Calcium 9.7  8.4 - 10.5 mg/dL   GFR calc non Af Amer 80 (*) >90 mL/min   GFR calc Af Amer >90  >90 mL/min   Anion gap 10  5 - 15  I-STAT TROPOININ, ED      Result Value Ref Range   Troponin i, poc 0.00  0.00 - 0.08 ng/mL   Comment 3            Dg Chest 2 View  03/29/2014   CLINICAL DATA:  Chest pain with shortness of breath and cough. Previous surgery on the right for lung carcinoma.  EXAM: CHEST  2 VIEW  COMPARISON:  December 31, 2013  FINDINGS: There is stable scarring with volume loss on the right. There is underlying emphysema. There is no edema or consolidation. The heart size and pulmonary vascularity are normal. No adenopathy. No bone lesions.  IMPRESSION: Stable scarring on right with volume loss. Underlying emphysema. No edema or consolidation. No  appreciable change compared to prior study.   Electronically Signed   By: Lowella Grip M.D.   On: 03/29/2014 13:06    Patient seen by me. Patient with onset of left-sided chest pain radiates to the back and down left arm with some tingling. This started 2 days ago. Chest x-ray negative except for some underlying emphysema. Patient feels as if she has phlegm in that area this may be all pulmonary. Could be a bronchitis without evidence of pneumonia on the chest x-ray. We'll go and do a d-dimer if elevated we'll proceed with doing CT angios chest. This would be to rule out pulmonary embolus. Patient's troponin is negative. Not likely to be acute cardiac event based on the negative troponin and the duration of the symptoms.   Fredia Sorrow, MD 03/29/14 [place], MD 03/29/14 [place], MD 03/29/14 [id number]

## 2014-03-29 NOTE — ED Notes (Signed)
Pt. Stated, i started having some left side back pain and then it sorted of went to my chest and down my left arm. I've never had that kind of pain like this.

## 2014-03-29 NOTE — ED Notes (Signed)
Pt to xray

## 2014-04-01 ENCOUNTER — Encounter: Payer: Self-pay | Admitting: Internal Medicine

## 2014-04-01 ENCOUNTER — Ambulatory Visit (INDEPENDENT_AMBULATORY_CARE_PROVIDER_SITE_OTHER): Payer: Medicare Other | Admitting: Internal Medicine

## 2014-04-01 VITALS — BP 114/70 | HR 80 | Ht 66.0 in | Wt 158.6 lb

## 2014-04-01 DIAGNOSIS — J45901 Unspecified asthma with (acute) exacerbation: Principal | ICD-10-CM

## 2014-04-01 DIAGNOSIS — J441 Chronic obstructive pulmonary disease with (acute) exacerbation: Secondary | ICD-10-CM

## 2014-04-01 DIAGNOSIS — J449 Chronic obstructive pulmonary disease, unspecified: Secondary | ICD-10-CM

## 2014-04-01 DIAGNOSIS — C3491 Malignant neoplasm of unspecified part of right bronchus or lung: Secondary | ICD-10-CM

## 2014-04-01 NOTE — Progress Notes (Signed)
Patient ID: [name], female    DOB: 1932/11/06, 80 y.o.   MRN: [id number]  HPI 816/12-80 year old female former smoker followed for history of right upper lobe cancer, COPD, allergic rhinitis  Here with husband Last here -August 05, 2010- note reviewed Increased  coughing 2 weeks, but struggling all summer. Better at beach. Here few days ago to see Dr Ronnald Ramp.   Had significantly productive cough- purulent, sometimes  with black spots. Uses nebulizer 2-3x/day, O2 2l/ [employer]. On Avelox now with Symbicort from Dr Ronnald Ramp. Mouth is getting sore. Still actively wheezing and short of breath.  CXR December 07, 2010- clear except old surgical changes.  She is not sure if she began this flare with cold or allergy. Low fever once or twice in past month. CT w/cm- 12/21/10- normal heart size coronary calcification. Prior right upper lobectomy. Stable right millimeter nodule at right minor fissure. No suspicious nodules. Centrilobular emphysema.  05/24/11- 80 year old female former smoker followed for history of right upper lobe cancer, COPD, allergic rhinitis. Here with husband Has had flu shot. She was feeling well until one week ago when she developed sore throat, temperature to 99.7, chills and malaise, cough productive of purulent blood-streaked sputum. Has home nebulizer being used only once daily with Xopenex. She denies swollen glands, fluid retention, GI upset or chest pain.  06/28/11- 80 year old female former smoker followed for history of right upper lobe cancer, COPD, allergic rhinitis. Here with husband Reports- wheezing, chest congestion, runny nose, cough with green/red mucus tinged with blood.  She got well after last visit and been sick again. Did have flu vaccine. Now 3 or 4 days of rhinorrhea, chills, productive cough with discolored sputum. 3 days ago had brief episode of pink in sputum with hard cough and. Cough and sputum production are less today. Occasional tussive soreness  at the xiphoid. Frequent heartburn. Doxycycline and seemed to help but is completed now. Does have home nebulizer.  Had chest x-ray January 7: *RADIOLOGY REPORT* 06/27/11- Clinical Data: 80 year old with hemoptysis.  CHEST - 2 VIEW  Comparison: Chest radiograph 05/24/2011  Findings: Chronic volume loss and postoperative changes in the  right hemithorax. The upper lungs are clear with increased  lucency. Findings suggest emphysematous changes. Stable linear  density at the right lung base is suggestive for scar. The heart  and mediastinum are stable. Slightly increased interstitial  densities in the right mid chest are similar to the prior  examination. No focal airspace disease.  IMPRESSION:  Stable chest radiograph findings. No acute changes.  Original Report Authenticated By: Markus Daft, M.D.   10/13/11- 80 year old female former smoker followed for history of right upper lobe cancer, COPD, allergic rhinitis. Here with husband Always has SOB, wheezing, cough, and congestion-has gotten worse due to pollen-eyes bothering her and runny nose(sneezing) Increased congestion in head and nose. She had had a pneumonia in December and then had fallen at home in January, bruising her face. Using nebulizer twice a day. Complains portable oxygen is too heavy and is thinking about [facility] to [facility]  02/16/12- 80 year old female former smoker followed for history of right upper lobe NSCCa, COPD, allergic rhinitis. Here with husband Sore throat and tongue-? white spots started 2-3 days ago; last night had cough-productive. Recent cortisone shot by orthopedist. COPD assessment test (CAT) score 13/24 CXR 01/06/12-reviewed with her IMPRESSION:  Stable emphysematous change and chronic postsurgical change of the  right lung without definite acute cardiopulmonary disease.  Original Report Authenticated By:  Rachel Moulds, M.D.   10/15/12- 54year-old female former smoker followed for history of  right upper lobe NSCCa, COPD, allergic rhinitis.  FOLLOWS CHY:IFOYDXAJO wheezing today; ? pollen causing it. Did well through the winter. Using oxygen 2 L/[facility]. Productive cough after being outdoors a lot yesterday. Clear sputum, no fever.  03/04/13- 25year-old female former smoker followed for history of right upper lobe Squamous Cell, COPD, allergic rhinitis FOLLOWS FOR:  Increased SOB and coughing w/ yellowish mucus since 9/11 Trying to help husband who has NHL. She reports one-week increased cough, green sputum. We sent doxycycline-sputum turning yellow. No fever or GI upset. Nasal congestion without headache. 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 Postoperative changes at the right lung base.  2. No acute cardiopulmonary disease or evidence for recurrent  disease.  Original Report Authenticated By: San Morelle, M.D.  04/16/13- 25year-old female former smoker followed for history of right upper lobe NSCCa, COPD, allergic rhinitis Follows for- Pt states her problems are improving.  Pt c/o mild prod cough with yellowish sputum with activity. Less cough, some nasal congestion. Less dyspnea on exertion. 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 HR and BP were high. Did not go to ER.   patient describes cough, white sputum, blowing white from nose, headache, raw throat. Denies fever or myalgias except feels sore across front of chest from coughing. No GI  upset. CXR 12/31/12 IMPRESSION:  1. Postoperative changes at the right lung base.  2. No acute cardiopulmonary disease or evidence for recurrent  disease.  Original Report Authenticated By: San Morelle, M.D.  10/15/13- 63year-old female former smoker followed for history of right upper lobe NSCCa, COPD, allergic rhinitis, complicated by glaucoma   Family here    Husband has cancer FOLLOWS FOR:  Sinus and chest congestion with cough white mucus and sneezing x3 days Exacerbation cleared well with Avelox and that worried. Had been well until 3 days ago now-has sneezing, watery nose blamed on pollen. Damp weather increases cough and head congestion. Baseline is daily chronic productive cough with white sputum in the mornings. CXR 07/29/13 IMPRESSION:  Chronic volume loss and pleural thickening in the right hemithorax.  No acute chest findings.  Electronically Signed  By: Markus Daft M.D.  On: 07/26/2013 17:49  04/01/14- 77year-old female former smoker followed for history of right upper lobe NSCCa, COPD, allergic rhinitis, complicated by glaucoma    Husband here, has cancer ACUTE VISIT: cough-productive-pinkish/red in color; recently at ER, CP and upper back pain that went down her arm. SOB as well. D-dimer, CBC, troponin and BMET were unremarkable Pt describes chest congestion starting 4-5 days ago, then sore L scapula x 2 days, maybe low grade fever and chilly one day. Woke with pain leftr upper arm 2 days ago. Went to ER. Taking capsules of otc mucus thinner with red color- blames that for pink sputum and mild queasy. Some occasional wheeze. Tussive soreness R lower anterior costal margin. Achey all over "like flu". Has not had flu shot yet. Shoulder pain is  gone.  CXR 03/29/14 IMPRESSION:  Stable scarring on right with volume loss. Underlying emphysema. No  edema or consolidation. No appreciable change compared to prior  study.  Electronically Signed  By: Lowella Grip M.D.  On:  03/29/2014 13:06  Review of Systems-see HPI Constitutional:   No-   weight loss, night sweats , +chills, fatigue, lassitude. HEENT:   No-  headaches, difficulty swallowing, tooth/dental problems, sore throat,     No-  sneezing, itching, ear ache, +nasal congestion, post nasal drip,  CV:  +chest pain, orthopnea, PND, swelling in lower extremities, anasarca, dizziness, palpitations Resp:    +shortness of breath with exertion or at rest.                 +productive cough,  +non-productive cough,    no-coughing up of blood-.             No- change in color of mucus.    Skin: No-   rash or lesions. GI:  No-   heartburn, indigestion, abdominal pain, +nausea, No-vomiting,  GU:  MS:  +  joint pain or swelling.   Neuro- nothing unusual Psych:  No- change in mood or affect. No depression or anxiety.  No memory loss.     Objective:   Physical Exam General- Alert, Oriented, Affect-appropriate, Distress- none acute,                 Room air 94% Skin- rash-none, lesions- none, excoriation- none Lymphadenopathy- none Head- atraumatic            Eyes- Gross vision intact, PERRLA, conjunctivae clear secretions            Ears- Hearing, canals normal            Nose- Clear, no-Septal dev, mucus, polyps, erosion, perforation             Throat- Mallampati III-IV , mucosa clear , drainage- none, tonsils- atrophic;  Neck- flexible , trachea midline, no stridor , thyroid nl, carotid no bruit Chest - symmetrical excursion , unlabored           Heart/CV- RRR , no murmur , no gallop  , no rub, nl s1 s2                           - JVD- none , edema- none, stasis changes- none, varices- none           Lung- clear/ diminished, wheeze- none, cough+once,slight, talkative , dullness-none,                           rub- none           Chest wall-  Abd-  Br/ Gen/ Rectal- Not done, not indicated Extrem- cyanosis- none, clubbing, none, atrophy- none, strength- nl Neuro- grossly intact to observation

## 2014-04-01 NOTE — Assessment & Plan Note (Signed)
No documented recurrence

## 2014-04-01 NOTE — Assessment & Plan Note (Addendum)
Acute exacerbation. Compatible with viral bronchitis. No clear indication for antibiotic, which would just bother queasy stomach. For now the medication is likely cause of reddish sputum. Too late in course for Tamiflu. Plan- fluids, rest, depomedrol. If much worse- go to ER. If this drags on, then call.

## 2014-04-01 NOTE — Patient Instructions (Signed)
Depomedrol 80  Fluids and rest  If you get much worse, go to ER.  Otherwise, if not getting better, please call us at the office and let us know what is going on.

## 2014-04-04 ENCOUNTER — Telehealth: Payer: Self-pay | Admitting: Internal Medicine

## 2014-04-04 DIAGNOSIS — R042 Hemoptysis: Secondary | ICD-10-CM

## 2014-04-04 NOTE — Telephone Encounter (Signed)
Called and spoke with pt and she stated that she was told by CY to call back to give an update.  She stated that she has not taken any more of the red medication.  She said that she is still getting up red looking sputum.  She is still having some SOB.  Pt wanted to call and make CY aware and get any further recs from him. CY please advise. Thanks  Allergies  Allergen Reactions  . Daliresp [Roflumilast] Nausea Only  . Tiotropium Bromide Monohydrate     Urinary retention  . Ampicillin     REACTION: GI upset  . Azithromycin     REACTION: diarrhea  . Codeine   . Fludrocortisone Acetate Itching  . Nitrofurantoin     REACTION: Upset GI  . Penicillins   . Sulfonamide Derivatives

## 2014-04-04 NOTE — Telephone Encounter (Signed)
Given her history and possibility that there is blood coming from her lung, recommend we schedule  CT chest with contrast for dx hemoptysis, hx of lung cancer

## 2014-04-04 NOTE — Telephone Encounter (Signed)
Spoke with the pt and notified of recs per CDY  Pt verbalized understanding  Order was sent to [facility]

## 2014-04-04 NOTE — Addendum Note (Signed)
Addended by: Rosana Berger on: 04/04/2014 05:24 PM   Modules accepted: Orders

## 2014-04-08 ENCOUNTER — Ambulatory Visit (INDEPENDENT_AMBULATORY_CARE_PROVIDER_SITE_OTHER)
Admission: RE | Admit: 2014-04-08 | Discharge: 2014-04-08 | Disposition: A | Discharge status: Home / Self Care | Payer: Medicare Other | Source: Ambulatory Visit | Attending: Internal Medicine | Admitting: Internal Medicine

## 2014-04-08 DIAGNOSIS — R042 Hemoptysis: Secondary | ICD-10-CM

## 2014-04-08 MED ORDER — IOHEXOL 300 MG/ML  SOLN
80.0000 mL | Freq: Once | INTRAMUSCULAR | Status: AC | PRN
  Administered 2014-04-08: 80 mL via INTRAVENOUS

## 2014-04-10 ENCOUNTER — Telehealth: Payer: Self-pay | Admitting: Internal Medicine

## 2014-04-10 ENCOUNTER — Other Ambulatory Visit: Payer: Self-pay

## 2014-04-10 DIAGNOSIS — R911 Solitary pulmonary nodule: Secondary | ICD-10-CM

## 2014-04-10 MED ORDER — CLARITHROMYCIN 500 MG PO TABS: 500.0000 mg | ORAL_TABLET | Freq: Two times a day (BID) | ORAL | Status: DC

## 2014-04-10 MED ORDER — PROMETHAZINE HCL 25 MG PO TABS
25.0000 mg | ORAL_TABLET | Freq: Four times a day (QID) | ORAL | Status: DC | PRN
Start: 2014-04-10 — End: 2014-06-02

## 2014-04-10 MED ORDER — BECLOMETHASONE DIPROPIONATE 80 MCG/ACT IN AERS: 2.0000 | INHALATION_SPRAY | Freq: Two times a day (BID) | RESPIRATORY_TRACT | Status: DC

## 2014-04-10 NOTE — Telephone Encounter (Signed)
Pt is returning call & can be reached at [phone].  [name]

## 2014-04-10 NOTE — Telephone Encounter (Signed)
CY pt is aware of results.  She is asking if we can call in phenergan.  Per Joellen Jersey this is ok to send in with the abx.  This has been done and order for the ct has been placed to be repeated in 3 months.  Pt is aware.

## 2014-04-10 NOTE — Telephone Encounter (Signed)
Result Notes    Notes Recorded by Deneise Lever, MD on 04/08/2014 at 8:55 PM CT chest- emphysema and atherosclerosis. There are multiple irregular densities in the Right lung. The radiologist favors these being inflammatory, but we need to see them improve after antibiotic, with a repeat CT scan with contrast in 3 months. Plan- order Rx Biaxin 500 mg, # 14, 1 twice daily after meals. Also daily probiotic otc, like Florastor or Align, to help stomach while on antibiotic. And Also order- future CT chest with contrast to be done in 3 months, dx lung nodules.     Called pt and LMTCB x1

## 2014-04-14 ENCOUNTER — Telehealth: Payer: Self-pay | Admitting: Internal Medicine

## 2014-04-14 ENCOUNTER — Encounter: Payer: Medicare Other | Admitting: Internal Medicine

## 2014-04-14 NOTE — Telephone Encounter (Signed)
No sign of another call going out to patient in epic Called spoke with patient, advised of the above.  Pt stated she was just concerned that something had changed with her 10/22 CT results/recs.   Advised pt to continue as recommended when we spoke with her last week and will forward to [facility] to ensure there is no paperwork on pt that required follow up.  Pt stated that if nothing further needed, then she does not need a call back.  Joellen Jersey, are you aware of any additional reason this pt was called?  Thanks!

## 2014-04-14 NOTE — Telephone Encounter (Signed)
Called spoke with pt. Made her aware I do not see where anyone has contacted. Nothing further needed

## 2014-04-14 NOTE — Telephone Encounter (Signed)
Pt is concerned because she called earlier today & hasn't gotten a response yet. [name]

## 2014-04-18 MED ORDER — METHYLPREDNISOLONE ACETATE 80 MG/ML IJ SUSP
80.0000 mg | Freq: Once | INTRAMUSCULAR | Status: AC
  Administered 2014-04-18: 80 mg via INTRAMUSCULAR

## 2014-04-18 NOTE — Addendum Note (Signed)
Addended by: Clayborne Dana C on: 04/18/2014 03:30 PM   Modules accepted: Orders

## 2014-04-30 ENCOUNTER — Other Ambulatory Visit (INDEPENDENT_AMBULATORY_CARE_PROVIDER_SITE_OTHER): Payer: Medicare Other

## 2014-04-30 ENCOUNTER — Ambulatory Visit (INDEPENDENT_AMBULATORY_CARE_PROVIDER_SITE_OTHER): Payer: Medicare Other | Admitting: Internal Medicine

## 2014-04-30 ENCOUNTER — Encounter: Payer: Self-pay | Admitting: Internal Medicine

## 2014-04-30 VITALS — BP 118/68 | HR 80 | Temp 97.5°F | Ht 66.0 in | Wt 150.0 lb

## 2014-04-30 DIAGNOSIS — E785 Hyperlipidemia, unspecified: Secondary | ICD-10-CM

## 2014-04-30 DIAGNOSIS — Z23 Encounter for immunization: Secondary | ICD-10-CM

## 2014-04-30 DIAGNOSIS — M25512 Pain in left shoulder: Secondary | ICD-10-CM

## 2014-04-30 DIAGNOSIS — E039 Hypothyroidism, unspecified: Secondary | ICD-10-CM

## 2014-04-30 DIAGNOSIS — R300 Dysuria: Secondary | ICD-10-CM

## 2014-04-30 DIAGNOSIS — J42 Unspecified chronic bronchitis: Secondary | ICD-10-CM

## 2014-04-30 LAB — URINALYSIS, ROUTINE W REFLEX MICROSCOPIC
BILIRUBIN URINE: NEGATIVE
Hgb urine dipstick: NEGATIVE
KETONES UR: NEGATIVE
Nitrite: NEGATIVE
Total Protein, Urine: NEGATIVE
URINE GLUCOSE: NEGATIVE
UROBILINOGEN UA: 0.2 (ref 0.0–1.0)
pH: 7.5 (ref 5.0–8.0)

## 2014-04-30 LAB — LIPID PANEL
Cholesterol: 240 mg/dL — ABNORMAL HIGH (ref 0–200)
HDL: 62.3 mg/dL (ref >39.00)
LDL Cholesterol: 148 mg/dL — ABNORMAL HIGH (ref 0–99)
NONHDL: 177.7
TRIGLYCERIDES: 150 mg/dL — AB (ref 0.0–149.0)
Total CHOL/HDL Ratio: 4
VLDL: 30 mg/dL (ref 0.0–40.0)

## 2014-04-30 LAB — TSH: TSH: 1.74 u[IU]/mL (ref 0.35–4.50)

## 2014-04-30 MED ORDER — TIZANIDINE HCL 2 MG PO TABS: 2.0000 mg | ORAL_TABLET | Freq: Three times a day (TID) | ORAL | Status: DC | PRN

## 2014-04-30 NOTE — Assessment & Plan Note (Signed)
2LPM [facility] O2 qhs and prn - Follows with pulm for same- stable +/- improved Noted CT chest 03/2014 changes - for follow up 3 mo to monitor changes given hx SCC hx with RULobectomy - pt and dtr aware of same

## 2014-04-30 NOTE — Patient Instructions (Signed)
It was good to see you today.  We have reviewed your prior records including labs and tests today  Your annual flu shot was given and/or updated today.  Test(s) ordered today. Your results will be released to [place] (or called to you) after review, usually within 72hours after test completion. If any changes need to be made, you will be notified at that same time.  Medications reviewed and updated Use generic Zanaflex for shoulder pain and muscle relaxer, especially at bedtime Your prescription(s) have been submitted to your pharmacy. Please take as directed and contact our office if you believe you are having problem(s) with the medication(s). No other changes recommended at this time.  If continued shoulder pain problems despite muscle relaxer and Tylenol or ibuprofen use, call for appointment with Dr. Tamala Julian to consider injection as needed  Please schedule followup in 6 months, call sooner if problems.

## 2014-04-30 NOTE — Progress Notes (Signed)
Subjective:    Patient ID: [name], female    DOB: 07-04-1932, 80 y.o.   MRN: [id number]  HPI  Patient is here for ED follow up from 03/29/14 Reviewed chronic medical issues and interval medical events  Past Medical History  Diagnosis Date  . Allergic rhinitis   . COPD (chronic obstructive pulmonary disease)   . Pelvic cyst   . Bladder atony   . Unspecified hypothyroidism   . Hyperlipidemia   . Non-small cell carcinoma of lung, stage 1 2005    1.4 cm Poorly differentiated Squamous cell RUL  T1N0 resected 09/22/2003  . Arthritis   . Asthma   . Emphysema of lung     Review of Systems  Constitutional: Negative for fever and fatigue.  Respiratory: Positive for cough (chronic).   Cardiovascular: Negative for chest pain and leg swelling.  Musculoskeletal: Positive for arthralgias (L shoulder and shoulder blade). Negative for joint swelling.       Objective:   Physical Exam  BP 118/68 mmHg  Pulse 80  Temp(Src) 97.5 F (36.4 C) (Oral)  Ht 5\' 6"  (1.676 m)  Wt 150 lb (68.04 kg)  BMI 24.22 kg/m2  SpO2 99% Wt Readings from Last 3 Encounters:  04/30/14 150 lb (68.04 kg)  04/01/14 158 lb 9.6 oz (71.94 kg)  02/12/14 153 lb (69.4 kg)   Constitutional: She appears well-developed and well-nourished. No distress, wearing [place] O2. Dtr at side Neck: Normal range of motion. Neck supple. No JVD present. No thyromegaly present.  Cardiovascular: Normal rate, regular rhythm and normal heart sounds.  No murmur heard. No BLE edema. Pulmonary/Chest: Effort normal and breath sounds normal. No respiratory distress. She has no wheezes.  MSkel: L Shoulder: Full range of motion with crepitus. Neurovascularly intact distally. Good strength with stress of rotator cuff but causes pain. Positive impingement signs. Psychiatric: She has a normal mood and affect. Her behavior is normal. Judgment and thought content normal.   Lab Results  Component Value Date   WBC 6.2 03/29/2014   HGB 12.8  03/29/2014   HCT 38.7 03/29/2014   PLT 217 03/29/2014   GLUCOSE 97 03/29/2014   CHOL 180 09/25/2013   TRIG 89.0 09/25/2013   HDL 73.10 09/25/2013   LDLDIRECT 152.8 04/10/2013   LDLCALC 89 09/25/2013   ALT 22 12/31/2012   AST 25 12/31/2012   NA 138 03/29/2014   K 4.2 03/29/2014   CL 98 03/29/2014   CREATININE 0.72 03/29/2014   BUN 5* 03/29/2014   CO2 30 03/29/2014   TSH 0.36 09/25/2013   INR 0.94 02/05/2010   HGBA1C 5.2 02/22/2012    Ct Chest W Contrast  04/08/2014   CLINICAL DATA:  80 year old female with history of lung cancer status post right upper lobectomy presenting with shortness of breath and hemoptysis.  EXAM: CT CHEST WITH CONTRAST  TECHNIQUE: Multidetector CT imaging of the chest was performed during intravenous contrast administration.  CONTRAST:  94mL OMNIPAQUE IOHEXOL 300 MG/ML  SOLN  COMPARISON:  Chest CT 12/21/2010.  FINDINGS: Mediastinum: Heart size is normal. There is no significant pericardial fluid, thickening or pericardial calcification. There is atherosclerosis of the thoracic aorta, the great vessels of the mediastinum and the coronary arteries, including calcified atherosclerotic plaque in the left anterior descending, left circumflex and right coronary arteries. Prominent nodal tissue in the infrahilar region on the right measuring 1.8 x 0.9 cm (image 32 of series 2) increased compared to the prior examination. No other definite pathologically enlarged mediastinal or  left hilar lymph nodes are noted. Small hiatal hernia.  Lungs/Pleura: Status post right upper lobectomy. Mild diffuse bronchial wall thickening with moderate centrilobular emphysema. Compared to the prior examination there is extensive septal thickening and patchy nodularity throughout the right lower lobe, most suggestive of infection. The largest of these nodules include a 1.6 x 1.1 cm nodule (image 34 of series 3), and a 1.2 x 1.3 cm nodule (image 32 of series 3). Pleural calcifications are again  noted in the lower right hemithorax, presumably related to prior surgery. Partially calcified 12 x 7 mm nodule associated with the minor fissure is very similar to the prior examination (allowing for differences in slice selection), and favored to be benign. Chronic pleural thickening in the base of the right hemithorax is unchanged.  Upper Abdomen: Sub cm low-attenuation lesion adjacent to this falciform ligament in the liver is too small characterize, but favored to represent a tiny cyst.  Musculoskeletal: There are no aggressive appearing lytic or blastic lesions noted in the visualized portions of the skeleton.  IMPRESSION: 1. Extensive septal thickening and patchy nodularity throughout the right lung, particularly in the right lower lobe, favored to be related to an atypical infection. Prominent right infrahilar lymph nodes are likely reactive in this setting. However, a repeat contrast enhanced chest CT is recommended in 2-3 months after appropriate trial of antimicrobial therapy to ensure resolution of these findings. 2. Status post right upper lobectomy. 3. Mild diffuse bronchial wall thickening with moderate centrilobular emphysema; imaging findings suggestive of underlying COPD. 4. Atherosclerosis, including 3 vessel coronary artery disease. Assessment for potential risk factor modification, dietary therapy or pharmacologic therapy may be warranted, if clinically indicated. 5. Additional incidental findings, similar prior studies, as above.   Electronically Signed   By: Vinnie Langton M.D.   On: 04/08/2014 17:44       Assessment & Plan:   L shoulder impingement symptoms - tx with muscle relaxer - follow up sports med for consideration of injection if unimproved with continued ibuprofen and tylenol at home  Problem List Items Addressed This Visit    COPD (chronic obstructive pulmonary disease)    2LPM [place] O2 qhs and prn - Follows with pulm for same- stable +/- improved Noted CT chest 03/2014  changes - for follow up 3 mo to monitor changes given hx SCC hx with RULobectomy - pt and dtr aware of same    Dyslipidemia - Primary (Chronic)    Prev on Lipitor - stopped due to myalgia - then increase lipids prompted resume of med tx - crestor caused muscle fatigue so decrease dose (protected by high HDL) resumed generic statin 10/2011 due to $$ issues -takes intermittently Check annually, titrate as needed to goal and as tolerated    Relevant Orders      Lipid panel   Hypothyroidism    Lab Results  Component Value Date   TSH 0.36 09/25/2013  check TSH annually and prn The current medical regimen is effective;  continue present plan and medications.     Relevant Orders      TSH    Other Visit Diagnoses    Left shoulder pain

## 2014-04-30 NOTE — Progress Notes (Signed)
Pre visit review using our clinic review tool, if applicable. No additional management support is needed unless otherwise documented below in the visit note. 

## 2014-04-30 NOTE — Assessment & Plan Note (Signed)
Lab Results  Component Value Date   TSH 0.36 09/25/2013  check TSH annually and prn The current medical regimen is effective;  continue present plan and medications.

## 2014-04-30 NOTE — Assessment & Plan Note (Signed)
Prev on Lipitor - stopped due to myalgia - then increase lipids prompted resume of med tx - crestor caused muscle fatigue so decrease dose (protected by high HDL) resumed generic statin 10/2011 due to $$ issues -takes intermittently Check annually, titrate as needed to goal and as tolerated

## 2014-05-01 MED ORDER — LOVASTATIN 40 MG PO TABS: 40.0000 mg | ORAL_TABLET | Freq: Every day | ORAL | Status: DC

## 2014-05-01 NOTE — Addendum Note (Signed)
Addended by: Gwendolyn Grant A on: 05/01/2014 12:08 PM   Modules accepted: Orders

## 2014-05-02 ENCOUNTER — Telehealth: Payer: Self-pay | Admitting: Internal Medicine

## 2014-05-02 MED ORDER — LEVOTHYROXINE SODIUM 100 MCG PO TABS: 100.0000 ug | ORAL_TABLET | Freq: Every day | ORAL | Status: DC

## 2014-05-02 NOTE — Telephone Encounter (Signed)
Pt request lab result that was done on 04/30/14,pt can not get into her my chart to get the result. Please give her call.

## 2014-05-02 NOTE — Telephone Encounter (Signed)
Spoke to pt and gave lab results.  Pt stated that she is only taking the cholesterol meds (20mg ) every other day.

## 2014-05-16 ENCOUNTER — Other Ambulatory Visit: Payer: Self-pay | Admitting: Internal Medicine

## 2014-05-19 ENCOUNTER — Other Ambulatory Visit: Payer: Self-pay | Admitting: Internal Medicine

## 2014-05-23 ENCOUNTER — Encounter (HOSPITAL_COMMUNITY): Payer: Self-pay | Admitting: Emergency Medicine

## 2014-05-23 ENCOUNTER — Emergency Department (HOSPITAL_COMMUNITY): Payer: Medicare Other

## 2014-05-23 ENCOUNTER — Inpatient Hospital Stay (HOSPITAL_COMMUNITY)
Admission: EM | Admit: 2014-05-23 | Discharge: 2014-05-25 | DRG: 392 | Disposition: A | Discharge status: Home / Self Care | Payer: Medicare Other | Attending: Family Medicine | Admitting: Family Medicine

## 2014-05-23 DIAGNOSIS — C3490 Malignant neoplasm of unspecified part of unspecified bronchus or lung: Secondary | ICD-10-CM | POA: Diagnosis present

## 2014-05-23 DIAGNOSIS — Z85118 Personal history of other malignant neoplasm of bronchus and lung: Secondary | ICD-10-CM

## 2014-05-23 DIAGNOSIS — K5732 Diverticulitis of large intestine without perforation or abscess without bleeding: Principal | ICD-10-CM

## 2014-05-23 DIAGNOSIS — R109 Unspecified abdominal pain: Secondary | ICD-10-CM

## 2014-05-23 DIAGNOSIS — E039 Hypothyroidism, unspecified: Secondary | ICD-10-CM | POA: Diagnosis present

## 2014-05-23 DIAGNOSIS — Z7982 Long term (current) use of aspirin: Secondary | ICD-10-CM

## 2014-05-23 DIAGNOSIS — Z87891 Personal history of nicotine dependence: Secondary | ICD-10-CM

## 2014-05-23 DIAGNOSIS — R1032 Left lower quadrant pain: Secondary | ICD-10-CM | POA: Diagnosis not present

## 2014-05-23 DIAGNOSIS — J45909 Unspecified asthma, uncomplicated: Secondary | ICD-10-CM | POA: Diagnosis present

## 2014-05-23 DIAGNOSIS — M1990 Unspecified osteoarthritis, unspecified site: Secondary | ICD-10-CM | POA: Diagnosis present

## 2014-05-23 DIAGNOSIS — Z9849 Cataract extraction status, unspecified eye: Secondary | ICD-10-CM

## 2014-05-23 DIAGNOSIS — E785 Hyperlipidemia, unspecified: Secondary | ICD-10-CM | POA: Diagnosis present

## 2014-05-23 DIAGNOSIS — K5792 Diverticulitis of intestine, part unspecified, without perforation or abscess without bleeding: Secondary | ICD-10-CM | POA: Diagnosis present

## 2014-05-23 DIAGNOSIS — J449 Chronic obstructive pulmonary disease, unspecified: Secondary | ICD-10-CM | POA: Diagnosis present

## 2014-05-23 DIAGNOSIS — Z79899 Other long term (current) drug therapy: Secondary | ICD-10-CM

## 2014-05-23 LAB — COMPREHENSIVE METABOLIC PANEL
ALT: 15 U/L (ref 0–35)
AST: 24 U/L (ref 0–37)
Albumin: 4.1 g/dL (ref 3.5–5.2)
Alkaline Phosphatase: 93 U/L (ref 39–117)
Anion gap: 13 (ref 5–15)
BILIRUBIN TOTAL: 0.4 mg/dL (ref 0.3–1.2)
BUN: 8 mg/dL (ref 6–23)
CHLORIDE: 93 meq/L — AB (ref 96–112)
CO2: 28 meq/L (ref 19–32)
Calcium: 10 mg/dL (ref 8.4–10.5)
Creatinine, Ser: 0.73 mg/dL (ref 0.50–1.10)
GFR calc Af Amer: >90 mL/min (ref >90)
GFR, EST NON AFRICAN AMERICAN: 79 mL/min — AB (ref >90)
GLUCOSE: 118 mg/dL — AB (ref 70–99)
Potassium: 3.9 mEq/L (ref 3.7–5.3)
Sodium: 134 mEq/L — ABNORMAL LOW (ref 137–147)
Total Protein: 7.7 g/dL (ref 6.0–8.3)

## 2014-05-23 LAB — CBC WITH DIFFERENTIAL/PLATELET
BASOS ABS: 0 10*3/uL (ref 0.0–0.1)
Basophils Relative: 0 % (ref 0–1)
Eosinophils Absolute: 0 10*3/uL (ref 0.0–0.7)
Eosinophils Relative: 0 % (ref 0–5)
HEMATOCRIT: 36.3 % (ref 36.0–46.0)
HEMOGLOBIN: 11.8 g/dL — AB (ref 12.0–15.0)
LYMPHS ABS: 1.4 10*3/uL (ref 0.7–4.0)
LYMPHS PCT: 11 % — AB (ref 12–46)
MCH: 29.9 pg (ref 26.0–34.0)
MCHC: 32.5 g/dL (ref 30.0–36.0)
MCV: 92.1 fL (ref 78.0–100.0)
MONO ABS: 1.1 10*3/uL — AB (ref 0.1–1.0)
MONOS PCT: 8 % (ref 3–12)
NEUTROS ABS: 10.8 10*3/uL — AB (ref 1.7–7.7)
Neutrophils Relative %: 81 % — ABNORMAL HIGH (ref 43–77)
Platelets: 209 10*3/uL (ref 150–400)
RBC: 3.94 MIL/uL (ref 3.87–5.11)
RDW: 12.9 % (ref 11.5–15.5)
WBC: 13.3 10*3/uL — AB (ref 4.0–10.5)

## 2014-05-23 LAB — LIPASE, BLOOD: Lipase: 32 U/L (ref 11–59)

## 2014-05-23 MED ORDER — IOHEXOL 300 MG/ML  SOLN: 25.0000 mL | Freq: Once | INTRAMUSCULAR | Status: AC | PRN

## 2014-05-23 MED ORDER — IOHEXOL 300 MG/ML  SOLN
100.0000 mL | Freq: Once | INTRAMUSCULAR | Status: AC | PRN
  Administered 2014-05-23: 100 mL via INTRAVENOUS

## 2014-05-23 MED ORDER — MORPHINE SULFATE 4 MG/ML IJ SOLN
4.0000 mg | Freq: Once | INTRAMUSCULAR | Status: AC
  Administered 2014-05-23: 4 mg via INTRAVENOUS

## 2014-05-23 MED ORDER — SODIUM CHLORIDE 0.9 % IV BOLUS (SEPSIS)
1000.0000 mL | Freq: Once | INTRAVENOUS | Status: AC
  Administered 2014-05-23: 1000 mL via INTRAVENOUS

## 2014-05-23 MED ORDER — ONDANSETRON HCL 4 MG/2ML IJ SOLN
4.0000 mg | Freq: Once | INTRAMUSCULAR | Status: AC
  Administered 2014-05-23: 4 mg via INTRAVENOUS

## 2014-05-23 NOTE — ED Provider Notes (Signed)
CSN: [id number]     Arrival date & time 05/23/14  2033 History   First MD Initiated Contact with Patient 05/23/14 2212     Chief Complaint  Patient presents with  . Abdominal Pain     (Consider location/radiation/quality/duration/timing/severity/associated sxs/prior Treatment) HPI Comments: Patient is a 80 yo F PMHx significant for COPD, HLD, h/x of diverticulitis presenting to the ED for acute onset LLQ pain with associated nausea, 3 episodes of non-bloody non-bilious emesis, 1 episode of non-bloody diarrhea. Patient endorses associated subjective fevers and chills. No modifying factors identified. Abdominal surgical history includes hysterectomy.  Patient is a 80 y.o. female presenting with abdominal pain.  Abdominal Pain Associated symptoms: chills, diarrhea, nausea and vomiting     Past Medical History  Diagnosis Date  . Allergic rhinitis   . COPD (chronic obstructive pulmonary disease)   . Pelvic cyst   . Bladder atony   . Unspecified hypothyroidism   . Hyperlipidemia   . Non-small cell carcinoma of lung, stage 1 2005    1.4 cm Poorly differentiated Squamous cell RUL  T1N0 resected 09/22/2003  . Arthritis   . Asthma   . Emphysema of lung    Past Surgical History  Procedure Laterality Date  . Lung lobectomy  2005    Right Upper Dr Arlyce Dice  . Abdominal hysterectomy    . Spine surgery    . Needle aspiration pelvic cyst  2011    Dr Diona Fanti  . Cataract extraction     Family History  Problem Relation Age of Onset  . Heart disease Father   . Rheumatologic disease Sister   . Rheumatologic disease Brother   . Cancer Brother     Leukemia   History  Substance Use Topics  . Smoking status: Former Research scientist (life sciences)  . Smokeless tobacco: Not on file     Comment: Quit 2001, Smoked x 42 yrs - 1 PPD  . Alcohol Use: No   OB History    No data available     Review of Systems  Constitutional: Positive for chills.  Gastrointestinal: Positive for nausea, vomiting, abdominal pain and  diarrhea. Negative for blood in stool and anal bleeding.  All other systems reviewed and are negative.     Allergies  Daliresp; Tiotropium bromide monohydrate; Ampicillin; Azithromycin; Codeine; Fludrocortisone acetate; Nitrofurantoin; Penicillins; and Sulfonamide derivatives  Home Medications   Prior to Admission medications   Medication Sig Start Date End Date Taking? Authorizing Provider  acetaminophen (TYLENOL ARTHRITIS PAIN) 650 MG CR tablet Take 650 mg by mouth as needed for pain.   Yes Historical Provider, MD  ALPRAZolam Duanne Moron) 1 MG tablet Take 1 tablet (1 mg total) by mouth at bedtime. 05/19/14  Yes Rowe Clack, MD  aspirin EC 81 MG tablet Take 81 mg by mouth 2 (two) times a week.    Yes Historical Provider, MD  beclomethasone (QVAR) 80 MCG/ACT inhaler Inhale 2 puffs into the lungs 2 (two) times daily. 04/10/14  Yes Rowe Clack, MD  levalbuterol Penne Lash) 1.25 MG/3ML nebulizer solution Take 1.25 mg by nebulization every 8 (eight) hours as needed for wheezing or shortness of breath.   Yes Historical Provider, MD  levothyroxine (SYNTHROID, LEVOTHROID) 100 MCG tablet Take 1 tablet (100 mcg total) by mouth daily before breakfast. 05/02/14  Yes Rowe Clack, MD  lovastatin (MEVACOR) 40 MG tablet Take 1 tablet (40 mg total) by mouth at bedtime. 05/01/14  Yes Rowe Clack, MD  Multiple Vitamin (MULTIVITAMIN WITH MINERALS) TABS tablet Take  1 tablet by mouth daily.   Yes Historical Provider, MD  promethazine (PHENERGAN) 25 MG tablet Take 1 tablet (25 mg total) by mouth every 6 (six) hours as needed for nausea or vomiting. 04/10/14  Yes Deneise Lever, MD  benzonatate (TESSALON) 100 MG capsule Take 1 capsule (100 mg total) by mouth every 6 (six) hours as needed for cough. Patient not taking: Reported on 05/23/2014 07/04/13   Deneise Lever, MD  Multiple Vitamins-Minerals (CENTRUM SILVER PO) Take 1 tablet by mouth daily.     Historical Provider, MD   Pseudoephedrine-DM-GG-APAP ([facility] COLD & COUGH PO) Take 2 capsules by mouth once.    Historical Provider, MD  Respiratory Therapy Supplies (FLUTTER) DEVI Blow through 4 times per cycle and repeat 3 cycles per day 10/15/13   Deneise Lever, MD  tiZANidine (ZANAFLEX) 2 MG tablet Take 1 tablet (2 mg total) by mouth every 8 (eight) hours as needed for muscle spasms. Patient not taking: Reported on 05/23/2014 04/30/14   Rowe Clack, MD   BP 133/53 mmHg  Pulse 83  Temp(Src) 98.8 F (37.1 C) (Oral)  Resp 20 Physical Exam  Constitutional: She is oriented to person, place, and time. She appears well-developed and well-nourished. She appears ill. No distress. Nasal cannula in place.  HENT:  Head: Normocephalic and atraumatic.  Right Ear: External ear normal.  Left Ear: External ear normal.  Nose: Nose normal.  Mouth/Throat: Oropharynx is clear and moist. No oropharyngeal exudate.  Eyes: Conjunctivae are normal.  Neck: Neck supple.  Cardiovascular: Normal rate, regular rhythm and normal heart sounds.   Pulmonary/Chest: Breath sounds normal.  Abdominal: Soft. Normal appearance and bowel sounds are normal. She exhibits no distension. There is tenderness. There is guarding. There is no CVA tenderness.  Generalized tenderness with increased tenderness in suprapubic and left lower quadrant regions  Neurological: She is alert and oriented to person, place, and time.  Skin: Skin is warm and dry. She is not diaphoretic.    ED Course  Procedures (including critical care time) Medications  iohexol (OMNIPAQUE) 300 MG/ML solution 25 mL (not administered)  ciprofloxacin (CIPRO) IVPB 400 mg (not administered)  metroNIDAZOLE (FLAGYL) IVPB 500 mg (500 mg Intravenous New Bag/Given 05/24/14 0029)  ondansetron (ZOFRAN) injection 4 mg (4 mg Intravenous Given 05/23/14 2302)  morphine 4 MG/ML injection 4 mg (4 mg Intravenous Given 05/23/14 2302)  sodium chloride 0.9 % bolus 1,000 mL (0 mLs Intravenous Stopped  05/24/14 0033)  iohexol (OMNIPAQUE) 300 MG/ML solution 100 mL (100 mLs Intravenous Contrast Given 05/23/14 2325)    Labs Review Labs Reviewed  CBC WITH DIFFERENTIAL - Abnormal; Notable for the following:    WBC 13.3 (*)    Hemoglobin 11.8 (*)    Neutrophils Relative % 81 (*)    Neutro Abs 10.8 (*)    Lymphocytes Relative 11 (*)    Monocytes Absolute 1.1 (*)    All other components within normal limits  COMPREHENSIVE METABOLIC PANEL - Abnormal; Notable for the following:    Sodium 134 (*)    Chloride 93 (*)    Glucose, Bld 118 (*)    GFR calc non Af Amer 79 (*)    All other components within normal limits  LIPASE, BLOOD  URINALYSIS, ROUTINE W REFLEX MICROSCOPIC    Imaging Review Ct Abdomen Pelvis W Contrast  05/24/2014   CLINICAL DATA:  Left lower quadrant pain, vomiting, diarrhea  EXAM: CT ABDOMEN AND PELVIS WITH CONTRAST  TECHNIQUE: Multidetector CT imaging of the  abdomen and pelvis was performed using the standard protocol following bolus administration of intravenous contrast.  CONTRAST:  147mL OMNIPAQUE IOHEXOL 300 MG/ML  SOLN  COMPARISON:  12/30/2013  FINDINGS: Lower chest: Trace right pleural effusion with pleural-based calcifications, chronic. Emphysematous changes at the lung bases.  Hepatobiliary: Liver is notable for tiny probable cysts in the inferior right hepatic lobe (series 2/images 26 and 28), although technically indeterminate.  Gallbladder is unremarkable. No intrahepatic or extrahepatic ductal dilatation.  Pancreas: Within normal limits.  Spleen: Within normal limits.  Adrenals/Urinary Tract: Adrenal glands are unremarkable.  Kidneys are within normal limits.  Bladder is unremarkable.  Stomach/Bowel: Stomach is notable for a small hiatal hernia. Contrast in the distal esophagus, suggesting gastroesophageal reflux or esophageal dysmotility.  No evidence of bowel obstruction.  Normal appendix.  Colonic diverticulosis, with wall thickening/inflammatory changes involving the  proximal sigmoid colon (series 2/ image 52), compatible with sigmoid diverticulitis.  No drainable fluid collection/abscess.  No free air.  Vascular/Lymphatic: Atherosclerotic calcifications of the abdominal aorta and branch vessels.  No suspicious abdominopelvic lymphadenopathy.  Reproductive: Status post hysterectomy.  No adnexal masses.  Other: No abdominopelvic ascites.  Musculoskeletal: Degenerative changes of the visualized thoracolumbar spine.  IMPRESSION: Sigmoid diverticulitis.  No drainable fluid collection/abscess.  No free air.   Electronically Signed   By: Julian Hy M.D.   On: 05/24/2014 00:04     EKG Interpretation None      MDM   Final diagnoses:  Abdominal pain  Diverticulitis of large intestine without perforation or abscess without bleeding    Filed Vitals:   05/24/14 0000  BP: 133/53  Pulse:   Temp:   Resp:    I have reviewed nursing notes, vital signs, and all appropriate lab and imaging results for this patient.  Patient with diverticulitis without abscess or perforation noted on CT scan. Leukocytosis. IV Abx will be ordered. Given patient's dehydration, and abdominal examination will admit for antibiotics, pain management, and repeat abdominal examinations. Discussed patient with Dr. Roel Cluck who will admit to [facility]. Patient d/w with Dr. Sabra Heck, agrees with plan.      Harlow Mares, PA-C 05/24/14 1610  Johnna Acosta, MD 05/24/14 0111

## 2014-05-23 NOTE — ED Provider Notes (Signed)
The patient is a 80 year old female with acute onset of left lower quadrant pain at approximately 3:00 in the afternoon, has had intermittent pain since that time which is sharp, worse with movement and palpation of the abdomen and not associated with nausea or vomiting. She has had some loose stool. She denies any other abdominal surgical history except for a hysterectomy when she was younger. On exam the patient has a very tender left lower quadrant with some peritoneal signs on exam, vital signs are otherwise unremarkable, she will need a CT scan to further evaluate for complications of diverticulitis, would consider perforation, abscess, kidney stone though less likely given tenderness.  Medical screening examination/treatment/procedure(s) were conducted as a shared visit with non-physician practitioner(s) and myself.  I personally evaluated the patient during the encounter.  Clinical Impression:   Final diagnoses:  Abdominal pain  Diverticulitis of large intestine without perforation or abscess without bleeding         Johnna Acosta, MD 05/24/14 [phone]

## 2014-05-23 NOTE — ED Notes (Signed)
Pt. reports LLQ pain with emesis and diarrhea onset this afternoon , denies dysuria , no fever or chills.

## 2014-05-24 ENCOUNTER — Encounter (HOSPITAL_COMMUNITY): Payer: Self-pay | Admitting: Internal Medicine

## 2014-05-24 DIAGNOSIS — Z79899 Other long term (current) drug therapy: Secondary | ICD-10-CM | POA: Diagnosis not present

## 2014-05-24 DIAGNOSIS — Z87891 Personal history of nicotine dependence: Secondary | ICD-10-CM | POA: Diagnosis not present

## 2014-05-24 DIAGNOSIS — Z85118 Personal history of other malignant neoplasm of bronchus and lung: Secondary | ICD-10-CM | POA: Diagnosis not present

## 2014-05-24 DIAGNOSIS — M1990 Unspecified osteoarthritis, unspecified site: Secondary | ICD-10-CM | POA: Diagnosis present

## 2014-05-24 DIAGNOSIS — Z7982 Long term (current) use of aspirin: Secondary | ICD-10-CM | POA: Diagnosis not present

## 2014-05-24 DIAGNOSIS — K5732 Diverticulitis of large intestine without perforation or abscess without bleeding: Secondary | ICD-10-CM | POA: Diagnosis present

## 2014-05-24 DIAGNOSIS — J449 Chronic obstructive pulmonary disease, unspecified: Secondary | ICD-10-CM | POA: Diagnosis present

## 2014-05-24 DIAGNOSIS — J42 Unspecified chronic bronchitis: Secondary | ICD-10-CM

## 2014-05-24 DIAGNOSIS — E785 Hyperlipidemia, unspecified: Secondary | ICD-10-CM | POA: Diagnosis present

## 2014-05-24 DIAGNOSIS — Z9849 Cataract extraction status, unspecified eye: Secondary | ICD-10-CM | POA: Diagnosis not present

## 2014-05-24 DIAGNOSIS — E039 Hypothyroidism, unspecified: Secondary | ICD-10-CM | POA: Diagnosis present

## 2014-05-24 DIAGNOSIS — R1032 Left lower quadrant pain: Secondary | ICD-10-CM | POA: Diagnosis present

## 2014-05-24 DIAGNOSIS — J45909 Unspecified asthma, uncomplicated: Secondary | ICD-10-CM | POA: Diagnosis present

## 2014-05-24 DIAGNOSIS — K5792 Diverticulitis of intestine, part unspecified, without perforation or abscess without bleeding: Secondary | ICD-10-CM | POA: Diagnosis present

## 2014-05-24 LAB — CBC
HCT: 35 % — ABNORMAL LOW (ref 36.0–46.0)
Hemoglobin: 11 g/dL — ABNORMAL LOW (ref 12.0–15.0)
MCH: 29.3 pg (ref 26.0–34.0)
MCHC: 31.4 g/dL (ref 30.0–36.0)
MCV: 93.3 fL (ref 78.0–100.0)
PLATELETS: 192 10*3/uL (ref 150–400)
RBC: 3.75 MIL/uL — ABNORMAL LOW (ref 3.87–5.11)
RDW: 13.4 % (ref 11.5–15.5)
WBC: 11.4 10*3/uL — ABNORMAL HIGH (ref 4.0–10.5)

## 2014-05-24 LAB — COMPREHENSIVE METABOLIC PANEL
ALK PHOS: 84 U/L (ref 39–117)
ALT: 14 U/L (ref 0–35)
AST: 21 U/L (ref 0–37)
Albumin: 3.3 g/dL — ABNORMAL LOW (ref 3.5–5.2)
Anion gap: 10 (ref 5–15)
BILIRUBIN TOTAL: 0.6 mg/dL (ref 0.3–1.2)
BUN: 8 mg/dL (ref 6–23)
CHLORIDE: 97 meq/L (ref 96–112)
CO2: 29 mEq/L (ref 19–32)
Calcium: 9.4 mg/dL (ref 8.4–10.5)
Creatinine, Ser: 0.73 mg/dL (ref 0.50–1.10)
GFR calc Af Amer: >90 mL/min (ref >90)
GFR calc non Af Amer: 79 mL/min — ABNORMAL LOW (ref >90)
Glucose, Bld: 103 mg/dL — ABNORMAL HIGH (ref 70–99)
POTASSIUM: 4.3 meq/L (ref 3.7–5.3)
Sodium: 136 mEq/L — ABNORMAL LOW (ref 137–147)
Total Protein: 6.9 g/dL (ref 6.0–8.3)

## 2014-05-24 LAB — URINALYSIS, ROUTINE W REFLEX MICROSCOPIC
BILIRUBIN URINE: NEGATIVE
Glucose, UA: NEGATIVE mg/dL
Hgb urine dipstick: NEGATIVE
KETONES UR: NEGATIVE mg/dL
NITRITE: NEGATIVE
PH: 7 (ref 5.0–8.0)
Protein, ur: NEGATIVE mg/dL
Specific Gravity, Urine: 1.026 (ref 1.005–1.030)
Urobilinogen, UA: 0.2 mg/dL (ref 0.0–1.0)

## 2014-05-24 LAB — TSH: TSH: 0.571 u[IU]/mL (ref 0.350–4.500)

## 2014-05-24 LAB — MAGNESIUM: Magnesium: 2 mg/dL (ref 1.5–2.5)

## 2014-05-24 LAB — URINE MICROSCOPIC-ADD ON

## 2014-05-24 LAB — PHOSPHORUS: Phosphorus: 3.1 mg/dL (ref 2.3–4.6)

## 2014-05-24 MED ORDER — FLUTICASONE PROPIONATE HFA 44 MCG/ACT IN AERO
1.0000 | INHALATION_SPRAY | Freq: Two times a day (BID) | RESPIRATORY_TRACT | Status: DC
  Administered 2014-05-24 – 2014-05-25 (×3): 1 via RESPIRATORY_TRACT
  Filled 2014-05-24 (×2): qty 10.6

## 2014-05-24 MED ORDER — LEVALBUTEROL HCL 1.25 MG/0.5ML IN NEBU
1.2500 mg | INHALATION_SOLUTION | Freq: Four times a day (QID) | RESPIRATORY_TRACT | Status: DC | PRN
  Administered 2014-05-24: 1.25 mg via RESPIRATORY_TRACT
  Filled 2014-05-24 (×3): qty 0.5

## 2014-05-24 MED ORDER — ALPRAZOLAM 0.5 MG PO TABS
1.0000 mg | ORAL_TABLET | Freq: Every day | ORAL | Status: DC
  Administered 2014-05-24: 1 mg via ORAL

## 2014-05-24 MED ORDER — ONDANSETRON HCL 4 MG/2ML IJ SOLN
4.0000 mg | Freq: Once | INTRAMUSCULAR | Status: AC
  Administered 2014-05-24: 4 mg via INTRAVENOUS

## 2014-05-24 MED ORDER — ONDANSETRON HCL 4 MG/2ML IJ SOLN: 4.0000 mg | Freq: Four times a day (QID) | INTRAMUSCULAR | Status: DC | PRN

## 2014-05-24 MED ORDER — HYDROCODONE-ACETAMINOPHEN 5-325 MG PO TABS: 1.0000 | ORAL_TABLET | ORAL | Status: DC | PRN

## 2014-05-24 MED ORDER — SODIUM CHLORIDE 0.9 % IV SOLN
INTRAVENOUS | Status: DC
  Administered 2014-05-24: 03:00:00 via INTRAVENOUS

## 2014-05-24 MED ORDER — SODIUM CHLORIDE 0.9 % IV SOLN
INTRAVENOUS | Status: DC
  Administered 2014-05-24: 18:00:00 via INTRAVENOUS

## 2014-05-24 MED ORDER — ONDANSETRON HCL 4 MG PO TABS: 4.0000 mg | ORAL_TABLET | Freq: Four times a day (QID) | ORAL | Status: DC | PRN

## 2014-05-24 MED ORDER — METRONIDAZOLE IN NACL 5-0.79 MG/ML-% IV SOLN
500.0000 mg | Freq: Once | INTRAVENOUS | Status: AC
  Administered 2014-05-24: 500 mg via INTRAVENOUS

## 2014-05-24 MED ORDER — ACETAMINOPHEN 325 MG PO TABS: 650.0000 mg | ORAL_TABLET | Freq: Four times a day (QID) | ORAL | Status: DC | PRN

## 2014-05-24 MED ORDER — SODIUM CHLORIDE 0.9 % IJ SOLN: 3.0000 mL | Freq: Two times a day (BID) | INTRAMUSCULAR | Status: DC

## 2014-05-24 MED ORDER — LEVOTHYROXINE SODIUM 100 MCG PO TABS
100.0000 ug | ORAL_TABLET | Freq: Every day | ORAL | Status: DC
  Administered 2014-05-24 – 2014-05-25 (×2): 100 ug via ORAL
  Filled 2014-05-24 (×2): qty 1

## 2014-05-24 MED ORDER — GUAIFENESIN ER 600 MG PO TB12
600.0000 mg | ORAL_TABLET | Freq: Two times a day (BID) | ORAL | Status: DC
  Administered 2014-05-24 – 2014-05-25 (×4): 600 mg via ORAL
  Filled 2014-05-24 (×5): qty 1

## 2014-05-24 MED ORDER — MORPHINE SULFATE 4 MG/ML IJ SOLN
4.0000 mg | INTRAMUSCULAR | Status: DC | PRN
  Administered 2014-05-24 (×2): 2 mg via INTRAVENOUS

## 2014-05-24 MED ORDER — TIZANIDINE HCL 2 MG PO TABS
2.0000 mg | ORAL_TABLET | Freq: Three times a day (TID) | ORAL | Status: DC | PRN
  Filled 2014-05-24: qty 1

## 2014-05-24 MED ORDER — PRAVASTATIN SODIUM 40 MG PO TABS
40.0000 mg | ORAL_TABLET | Freq: Every day | ORAL | Status: DC
  Administered 2014-05-24: 40 mg via ORAL
  Filled 2014-05-24 (×3): qty 1

## 2014-05-24 MED ORDER — ACETAMINOPHEN 650 MG RE SUPP: 650.0000 mg | Freq: Four times a day (QID) | RECTAL | Status: DC | PRN

## 2014-05-24 MED ORDER — ASPIRIN EC 81 MG PO TBEC
81.0000 mg | DELAYED_RELEASE_TABLET | ORAL | Status: DC
  Filled 2014-05-24: qty 1

## 2014-05-24 MED ORDER — ALPRAZOLAM 0.5 MG PO TABS
1.0000 mg | ORAL_TABLET | Freq: Every evening | ORAL | Status: DC | PRN
  Administered 2014-05-24: 1 mg via ORAL
  Filled 2014-05-24: qty 2

## 2014-05-24 MED ORDER — METRONIDAZOLE IN NACL 5-0.79 MG/ML-% IV SOLN
500.0000 mg | Freq: Three times a day (TID) | INTRAVENOUS | Status: DC
  Administered 2014-05-24 – 2014-05-25 (×4): 500 mg via INTRAVENOUS
  Filled 2014-05-24 (×8): qty 100

## 2014-05-24 MED ORDER — CIPROFLOXACIN IN D5W 400 MG/200ML IV SOLN
400.0000 mg | Freq: Two times a day (BID) | INTRAVENOUS | Status: DC
  Administered 2014-05-24 – 2014-05-25 (×3): 400 mg via INTRAVENOUS
  Filled 2014-05-24 (×4): qty 200

## 2014-05-24 MED ORDER — CIPROFLOXACIN IN D5W 400 MG/200ML IV SOLN
400.0000 mg | Freq: Once | INTRAVENOUS | Status: AC
  Administered 2014-05-24: 400 mg via INTRAVENOUS

## 2014-05-24 MED ORDER — ENOXAPARIN SODIUM 40 MG/0.4ML ~~LOC~~ SOLN
40.0000 mg | SUBCUTANEOUS | Status: DC
  Administered 2014-05-24: 40 mg via SUBCUTANEOUS
  Filled 2014-05-24 (×2): qty 0.4

## 2014-05-24 NOTE — ED Notes (Signed)
Attempted to call report.  RN unavailable but will call back.

## 2014-05-24 NOTE — Progress Notes (Signed)
Pt was evaluated earlier this AM by my associate. Please refer to her H and P for details regarding assessment and plan.  We'll continue antibiotic coverage and advance diet today. We'll reassess next a.m.  Kaylana Fenstermacher, [employer]

## 2014-05-24 NOTE — Progress Notes (Signed)
Nutrition Brief Note  Patient identified on the Malnutrition Screening Tool (MST) Report  Per pt she has had no recent weight loss. She and her husband lost about 20 lb as she began to cook healthier at home during this time. Pt has since re-gained a few pounds. Pt with good appetite. Reviewed protein sources to include at each meal and how to utilize oral nutrition supplements at home if needed.   Wt Readings from Last 15 Encounters:  05/24/14 139 lb (63.05 kg)  04/30/14 150 lb (68.04 kg)  04/01/14 158 lb 9.6 oz (71.94 kg)  02/12/14 153 lb (69.4 kg)  01/07/14 153 lb 4.8 oz (69.536 kg)  12/30/13 151 lb 4 oz (68.607 kg)  10/15/13 151 lb 3.2 oz (68.584 kg)  09/25/13 149 lb 6.4 oz (67.767 kg)  07/26/13 156 lb (70.761 kg)  04/16/13 153 lb 3.2 oz (69.491 kg)  04/10/13 151 lb 12.8 oz (68.856 kg)  03/04/13 155 lb 9.6 oz (70.58 kg)  01/08/13 155 lb 4.8 oz (70.444 kg)  10/15/12 159 lb (72.122 kg)  10/10/12 156 lb (70.761 kg)    Body mass index is 22.45 kg/(m^2). WNL.   Current diet order is NPO. Labs and medications reviewed.   No nutrition interventions warranted at this time. If nutrition issues arise, please consult RD.   [place], [place], [place] Pager [phone] After Hours Pager

## 2014-05-24 NOTE — H&P (Signed)
PCP:   Gwendolyn Grant, MD  Oncology [facility] Pulmonology [place]  Chief Complaint:  Abdominal pain  HPI: [name] is a 80 y.o. female   has a past medical history of Allergic rhinitis; COPD (chronic obstructive pulmonary disease); Pelvic cyst; Bladder atony; Unspecified hypothyroidism; Hyperlipidemia; Non-small cell carcinoma of lung, stage 1 (2005); Arthritis; Asthma; and Emphysema of lung.   Presented with  Patient developed left lower quadrant pain that was severe. Patient had chills and low grade fever. She had nausea and vomiting. She presented to ERE and CT scan showed Sigmoid diverticulitis. She reports prior hx of similar episode.  In ER she was started on cipro and Flagyl. Reports last BM normal today, no bleeding. Currently much improved.  Hospitalist was called for admission for diverticulitis  Review of Systems:    Pertinent positives include: chills, abdominal pain, nausea, vomiting,  Constitutional:  No weight loss, night sweats, Fevers, fatigue, weight loss  HEENT:  No headaches, Difficulty swallowing,Tooth/dental problems,Sore throat,  No sneezing, itching, ear ache, nasal congestion, post nasal drip,  Cardio-vascular:  No chest pain, Orthopnea, PND, anasarca, dizziness, palpitations.no Bilateral lower extremity swelling  GI:  No heartburn, indigestion,  diarrhea, change in bowel habits, loss of appetite, melena, blood in stool, hematemesis Resp:  no shortness of breath at rest. No dyspnea on exertion, No excess mucus, no productive cough, No non-productive cough, No coughing up of blood.No change in color of mucus.No wheezing. Skin:  no rash or lesions. No jaundice GU:  no dysuria, change in color of urine, no urgency or frequency. No straining to urinate.  No flank pain.  Musculoskeletal:  No joint pain or no joint swelling. No decreased range of motion. No back pain.  Psych:  No change in mood or affect. No depression or anxiety.  No memory loss.  Neuro: no localizing neurological complaints, no tingling, no weakness, no double vision, no gait abnormality, no slurred speech, no confusion  Otherwise ROS are negative except for above, 10 systems were reviewed  Past Medical History: Past Medical History  Diagnosis Date  . Allergic rhinitis   . COPD (chronic obstructive pulmonary disease)   . Pelvic cyst   . Bladder atony   . Unspecified hypothyroidism   . Hyperlipidemia   . Non-small cell carcinoma of lung, stage 1 2005    1.4 cm Poorly differentiated Squamous cell RUL  T1N0 resected 09/22/2003  . Arthritis   . Asthma   . Emphysema of lung    Past Surgical History  Procedure Laterality Date  . Lung lobectomy  2005    Right Upper Dr Arlyce Dice  . Abdominal hysterectomy    . Spine surgery    . Needle aspiration pelvic cyst  2011    Dr Diona Fanti  . Cataract extraction       Medications: Prior to Admission medications   Medication Sig Start Date End Date Taking? Authorizing Provider  acetaminophen (TYLENOL ARTHRITIS PAIN) 650 MG CR tablet Take 650 mg by mouth as needed for pain.   Yes Historical Provider, MD  ALPRAZolam Duanne Moron) 1 MG tablet Take 1 tablet (1 mg total) by mouth at bedtime. 05/19/14  Yes Rowe Clack, MD  aspirin EC 81 MG tablet Take 81 mg by mouth 2 (two) times a week.    Yes Historical Provider, MD  beclomethasone (QVAR) 80 MCG/ACT inhaler Inhale 2 puffs into the lungs 2 (two) times daily. 04/10/14  Yes Rowe Clack, MD  levalbuterol Penne Lash) 1.25 MG/3ML nebulizer  solution Take 1.25 mg by nebulization every 8 (eight) hours as needed for wheezing or shortness of breath.   Yes Historical Provider, MD  levothyroxine (SYNTHROID, LEVOTHROID) 100 MCG tablet Take 1 tablet (100 mcg total) by mouth daily before breakfast. 05/02/14  Yes Rowe Clack, MD  lovastatin (MEVACOR) 40 MG tablet Take 1 tablet (40 mg total) by mouth at bedtime. 05/01/14  Yes Rowe Clack, MD  Multiple Vitamin  (MULTIVITAMIN WITH MINERALS) TABS tablet Take 1 tablet by mouth daily.   Yes Historical Provider, MD  promethazine (PHENERGAN) 25 MG tablet Take 1 tablet (25 mg total) by mouth every 6 (six) hours as needed for nausea or vomiting. 04/10/14  Yes Deneise Lever, MD  benzonatate (TESSALON) 100 MG capsule Take 1 capsule (100 mg total) by mouth every 6 (six) hours as needed for cough. Patient not taking: Reported on 05/23/2014 07/04/13   Deneise Lever, MD  Multiple Vitamins-Minerals (CENTRUM SILVER PO) Take 1 tablet by mouth daily.     Historical Provider, MD  Pseudoephedrine-DM-GG-APAP ([facility] COLD & COUGH PO) Take 2 capsules by mouth once.    Historical Provider, MD  Respiratory Therapy Supplies (FLUTTER) DEVI Blow through 4 times per cycle and repeat 3 cycles per day 10/15/13   Deneise Lever, MD  tiZANidine (ZANAFLEX) 2 MG tablet Take 1 tablet (2 mg total) by mouth every 8 (eight) hours as needed for muscle spasms. Patient not taking: Reported on 05/23/2014 04/30/14   Rowe Clack, MD    Allergies:   Allergies  Allergen Reactions  . Daliresp [Roflumilast] Nausea Only  . Tiotropium Bromide Monohydrate     Urinary retention  . Ampicillin     REACTION: GI upset  . Azithromycin     REACTION: diarrhea  . Codeine   . Fludrocortisone Acetate Itching  . Nitrofurantoin     REACTION: Upset GI  . Penicillins   . Sulfonamide Derivatives     Social History:  Ambulatory   Independently  Lives at home   With family     reports that she has quit smoking. She does not have any smokeless tobacco history on file. She reports that she does not drink alcohol or use illicit drugs.    Family History: family history includes Cancer in her brother; Heart disease in her father; Rheumatologic disease in her brother and sister.    Physical Exam: Patient Vitals for the past 24 hrs:  BP Temp Temp src Pulse Resp  05/24/14 0000 (!) 133/53 mmHg - - - -  05/23/14 2051 130/85 mmHg 98.8 F (37.1 C)  Oral 83 20    1. General:  in No Acute distress 2. Psychological: Alert and   Oriented 3. Head/ENT:   Moist   Mucous Membranes                          Head Non traumatic, neck supple                          Normal  Dentition 4. SKIN: normal  Skin turgor,  Skin clean Dry and intact no rash 5. Heart: Regular rate and rhythm no Murmur, Rub or gallop 6. Lungs: Clear to auscultation bilaterally, no wheezes or crackles   7. Abdomen: Soft, slight left lower quadrant tenderness, slightly distended 8. Lower extremities: no clubbing, cyanosis, or edema 9. Neurologically Grossly intact, moving all 4 extremities equally 10. MSK: Normal range  of motion  body mass index is unknown because there is no weight on file.   Labs on Admission:   Results for orders placed or performed during the hospital encounter of 05/23/14 (from the past 24 hour(s))  CBC with Differential     Status: Abnormal   Collection Time: 05/23/14  8:55 PM  Result Value Ref Range   WBC 13.3 (H) 4.0 - 10.5 K/uL   RBC 3.94 3.87 - 5.11 MIL/uL   Hemoglobin 11.8 (L) 12.0 - 15.0 g/dL   HCT 36.3 36.0 - 46.0 %   MCV 92.1 78.0 - 100.0 fL   MCH 29.9 26.0 - 34.0 pg   MCHC 32.5 30.0 - 36.0 g/dL   RDW 12.9 11.5 - 15.5 %   Platelets 209 150 - 400 K/uL   Neutrophils Relative % 81 (H) 43 - 77 %   Neutro Abs 10.8 (H) 1.7 - 7.7 K/uL   Lymphocytes Relative 11 (L) 12 - 46 %   Lymphs Abs 1.4 0.7 - 4.0 K/uL   Monocytes Relative 8 3 - 12 %   Monocytes Absolute 1.1 (H) 0.1 - 1.0 K/uL   Eosinophils Relative 0 0 - 5 %   Eosinophils Absolute 0.0 0.0 - 0.7 K/uL   Basophils Relative 0 0 - 1 %   Basophils Absolute 0.0 0.0 - 0.1 K/uL  Comprehensive metabolic panel     Status: Abnormal   Collection Time: 05/23/14  8:55 PM  Result Value Ref Range   Sodium 134 (L) 137 - 147 mEq/L   Potassium 3.9 3.7 - 5.3 mEq/L   Chloride 93 (L) 96 - 112 mEq/L   CO2 28 19 - 32 mEq/L   Glucose, Bld 118 (H) 70 - 99 mg/dL   BUN 8 6 - 23 mg/dL   Creatinine, Ser  0.73 0.50 - 1.10 mg/dL   Calcium 10.0 8.4 - 10.5 mg/dL   Total Protein 7.7 6.0 - 8.3 g/dL   Albumin 4.1 3.5 - 5.2 g/dL   AST 24 0 - 37 U/L   ALT 15 0 - 35 U/L   Alkaline Phosphatase 93 39 - 117 U/L   Total Bilirubin 0.4 0.3 - 1.2 mg/dL   GFR calc non Af Amer 79 (L) >90 mL/min   GFR calc Af Amer >90 >90 mL/min   Anion gap 13 5 - 15  Lipase, blood     Status: None   Collection Time: 05/23/14  8:55 PM  Result Value Ref Range   Lipase 32 11 - 59 U/L    UA not obtained  Lab Results  Component Value Date   HGBA1C 5.2 02/22/2012    CrCl cannot be calculated (Unknown ideal weight.).  BNP (last 3 results) No results for input(s): PROBNP in the last 8760 hours.  Other results:  I have pearsonaly reviewed this: ECG REPORT Not obtained   There were no vitals filed for this visit.   Cultures: No results found for: SDES, [place], CULT, REPTSTATUS   Radiological Exams on Admission: Ct Abdomen Pelvis W Contrast  05/24/2014   CLINICAL DATA:  Left lower quadrant pain, vomiting, diarrhea  EXAM: CT ABDOMEN AND PELVIS WITH CONTRAST  TECHNIQUE: Multidetector CT imaging of the abdomen and pelvis was performed using the standard protocol following bolus administration of intravenous contrast.  CONTRAST:  129mL OMNIPAQUE IOHEXOL 300 MG/ML  SOLN  COMPARISON:  12/30/2013  FINDINGS: Lower chest: Trace right pleural effusion with pleural-based calcifications, chronic. Emphysematous changes at the lung bases.  Hepatobiliary: Liver is  notable for tiny probable cysts in the inferior right hepatic lobe (series 2/images 26 and 28), although technically indeterminate.  Gallbladder is unremarkable. No intrahepatic or extrahepatic ductal dilatation.  Pancreas: Within normal limits.  Spleen: Within normal limits.  Adrenals/Urinary Tract: Adrenal glands are unremarkable.  Kidneys are within normal limits.  Bladder is unremarkable.  Stomach/Bowel: Stomach is notable for a small hiatal hernia. Contrast in the  distal esophagus, suggesting gastroesophageal reflux or esophageal dysmotility.  No evidence of bowel obstruction.  Normal appendix.  Colonic diverticulosis, with wall thickening/inflammatory changes involving the proximal sigmoid colon (series 2/ image 52), compatible with sigmoid diverticulitis.  No drainable fluid collection/abscess.  No free air.  Vascular/Lymphatic: Atherosclerotic calcifications of the abdominal aorta and branch vessels.  No suspicious abdominopelvic lymphadenopathy.  Reproductive: Status post hysterectomy.  No adnexal masses.  Other: No abdominopelvic ascites.  Musculoskeletal: Degenerative changes of the visualized thoracolumbar spine.  IMPRESSION: Sigmoid diverticulitis.  No drainable fluid collection/abscess.  No free air.   Electronically Signed   By: Julian Hy M.D.   On: 05/24/2014 00:04    Chart has been reviewed  Assessment/Plan  80 yo F with prior hx of COPD, lung Cancer in remmision and diveticulitis presents with another episode of diverticulitis.   Present on Admission:  . Diverticulitis - bowel rest, IV cipro and flagyl serial abdominal; exams . Non-small cell carcinoma of lung, stage 1 - currently in remmision . COPD (chronic obstructive pulmonary disease) - continue home medications   Prophylaxis: Lovenox ,   CODE STATUS:  FULL CODE   Other plan as per orders.  I have spent a total of 55 min on this admission  Tyreesha Maharaj 05/24/2014, 1:09 AM  [facility]  Pager [phone]   after 2 AM please page floor coverage PA If 7AM-7PM, please contact the day team taking care of the patient  [email or web address]  Password [email or web address]

## 2014-05-25 DIAGNOSIS — K5712 Diverticulitis of small intestine without perforation or abscess without bleeding: Secondary | ICD-10-CM

## 2014-05-25 LAB — CBC
HCT: 33.2 % — ABNORMAL LOW (ref 36.0–46.0)
Hemoglobin: 10.4 g/dL — ABNORMAL LOW (ref 12.0–15.0)
MCH: 29.5 pg (ref 26.0–34.0)
MCHC: 31.3 g/dL (ref 30.0–36.0)
MCV: 94.1 fL (ref 78.0–100.0)
Platelets: 173 10*3/uL (ref 150–400)
RBC: 3.53 MIL/uL — ABNORMAL LOW (ref 3.87–5.11)
RDW: 13.5 % (ref 11.5–15.5)
WBC: 5.8 10*3/uL (ref 4.0–10.5)

## 2014-05-25 MED ORDER — CIPROFLOXACIN HCL 500 MG PO TABS: 500.0000 mg | ORAL_TABLET | Freq: Two times a day (BID) | ORAL | Status: DC

## 2014-05-25 MED ORDER — ONDANSETRON HCL 4 MG PO TABS: 4.0000 mg | ORAL_TABLET | Freq: Four times a day (QID) | ORAL | Status: DC | PRN

## 2014-05-25 MED ORDER — METRONIDAZOLE 500 MG PO TABS: 500.0000 mg | ORAL_TABLET | Freq: Three times a day (TID) | ORAL | Status: DC

## 2014-05-25 NOTE — Discharge Summary (Signed)
Physician Discharge Summary  [name] ZOX:[id number] DOB: 09-06-1932 DOA: 05/23/2014  PCP: Gwendolyn Grant, MD  Admit date: 05/23/2014 Discharge date: 05/25/2014  Time spent: > 35 minutes  Recommendations for Outpatient Follow-up:  1. Please reassess sodium levels 2. Also decide whether or not to continue her prolonged antibiotic coverage based on patient condition  Discharge Diagnoses:  Active Problems:   COPD (chronic obstructive pulmonary disease)   Non-small cell carcinoma of lung, stage 1   Diverticulitis   Discharge Condition: Stable  Diet recommendation: Advance as tolerated to bland diet  Filed Weights   05/24/14 0209  Weight: 63.05 kg (139 lb)    History of present illness:  80 year old who presented with abdominal discomfort, decreased appetite and decreased by mouth intake secondary to acute sigmoid diverticulitis.  Hospital Course:  Acute sigmoid diverticulitis - Discharged on oral Cipro and Flagyl for 9 more days to complete a 10 day total treatment regimen. - WBC normalized on above antibiotics - Zofran for when necessary nausea or emesis  For other known and stable medical conditions listed above and on history of present illness PMH we'll continue home regimen  Procedures:  None  Consultations:  None  Discharge Exam: Filed Vitals:   05/25/14 1355  BP: 137/53  Pulse: 86  Temp: 97.5 F (36.4 C)  Resp: 16    General: Pt in nad, alert and awake Cardiovascular: rrr, no mrg Respiratory: cta bl, no wheezes  Discharge Instructions You were cared for by a hospitalist during your hospital stay. If you have any questions about your discharge medications or the care you received while you were in the hospital after you are discharged, you can call the unit and asked to speak with the hospitalist on call if the hospitalist that took care of you is not available. Once you are discharged, your primary care physician will handle any further  medical issues. Please note that NO REFILLS for any discharge medications will be authorized once you are discharged, as it is imperative that you return to your primary care physician (or establish a relationship with a primary care physician if you do not have one) for your aftercare needs so that they can reassess your need for medications and monitor your lab values.  Discharge Instructions    Call MD for:  redness, tenderness, or signs of infection (pain, swelling, redness, odor or green/yellow discharge around incision site)    Complete by:  As directed      Call MD for:  temperature >100.4    Complete by:  As directed      Diet - low sodium heart healthy    Complete by:  As directed      Discharge instructions    Complete by:  As directed   Please be sure to follow up with your primary care physician in 1-2 weeks or sooner should any new concerns arise.     Increase activity slowly    Complete by:  As directed           Current Discharge Medication List    START taking these medications   Details  ciprofloxacin (CIPRO) 500 MG tablet Take 1 tablet (500 mg total) by mouth 2 (two) times daily. Qty: 18 tablet, Refills: 0    metroNIDAZOLE (FLAGYL) 500 MG tablet Take 1 tablet (500 mg total) by mouth 3 (three) times daily. Qty: 27 tablet, Refills: 0    ondansetron (ZOFRAN) 4 MG tablet Take 1 tablet (4 mg total) by mouth every  6 (six) hours as needed for nausea. Qty: 20 tablet, Refills: 0      CONTINUE these medications which have NOT CHANGED   Details  acetaminophen (TYLENOL ARTHRITIS PAIN) 650 MG CR tablet Take 650 mg by mouth as needed for pain.    ALPRAZolam (XANAX) 1 MG tablet Take 1 tablet (1 mg total) by mouth at bedtime. Qty: 30 tablet, Refills: 3    aspirin EC 81 MG tablet Take 81 mg by mouth 2 (two) times a week.     beclomethasone (QVAR) 80 MCG/ACT inhaler Inhale 2 puffs into the lungs 2 (two) times daily. Qty: 1 Inhaler, Refills: 3    levalbuterol (XOPENEX) 1.25  MG/3ML nebulizer solution Take 1.25 mg by nebulization every 8 (eight) hours as needed for wheezing or shortness of breath.    levothyroxine (SYNTHROID, LEVOTHROID) 100 MCG tablet Take 1 tablet (100 mcg total) by mouth daily before breakfast. Qty: 90 tablet, Refills: 1    lovastatin (MEVACOR) 40 MG tablet Take 1 tablet (40 mg total) by mouth at bedtime. Qty: 90 tablet, Refills: 3    promethazine (PHENERGAN) 25 MG tablet Take 1 tablet (25 mg total) by mouth every 6 (six) hours as needed for nausea or vomiting. Qty: 30 tablet, Refills: 0    benzonatate (TESSALON) 100 MG capsule Take 1 capsule (100 mg total) by mouth every 6 (six) hours as needed for cough. Qty: 30 capsule, Refills: 3    Multiple Vitamins-Minerals (CENTRUM SILVER PO) Take 1 tablet by mouth daily.     Pseudoephedrine-DM-GG-APAP ([facility] COLD & COUGH PO) Take 2 capsules by mouth once.    Respiratory Therapy Supplies (FLUTTER) DEVI Blow through 4 times per cycle and repeat 3 cycles per day Qty: 1 each, Refills: 0    tiZANidine (ZANAFLEX) 2 MG tablet Take 1 tablet (2 mg total) by mouth every 8 (eight) hours as needed for muscle spasms. Qty: 30 tablet, Refills: 0       Allergies  Allergen Reactions  . Daliresp [Roflumilast] Nausea Only  . Tiotropium Bromide Monohydrate     Urinary retention  . Ampicillin     REACTION: GI upset  . Azithromycin     REACTION: diarrhea  . Codeine   . Fludrocortisone Acetate Itching  . Nitrofurantoin     REACTION: Upset GI  . Penicillins   . Sulfonamide Derivatives       The results of significant diagnostics from this hospitalization (including imaging, microbiology, ancillary and laboratory) are listed below for reference.    Significant Diagnostic Studies: Ct Abdomen Pelvis W Contrast  05/24/2014   CLINICAL DATA:  Left lower quadrant pain, vomiting, diarrhea  EXAM: CT ABDOMEN AND PELVIS WITH CONTRAST  TECHNIQUE: Multidetector CT imaging of the abdomen and pelvis was performed using  the standard protocol following bolus administration of intravenous contrast.  CONTRAST:  178mL OMNIPAQUE IOHEXOL 300 MG/ML  SOLN  COMPARISON:  12/30/2013  FINDINGS: Lower chest: Trace right pleural effusion with pleural-based calcifications, chronic. Emphysematous changes at the lung bases.  Hepatobiliary: Liver is notable for tiny probable cysts in the inferior right hepatic lobe (series 2/images 26 and 28), although technically indeterminate.  Gallbladder is unremarkable. No intrahepatic or extrahepatic ductal dilatation.  Pancreas: Within normal limits.  Spleen: Within normal limits.  Adrenals/Urinary Tract: Adrenal glands are unremarkable.  Kidneys are within normal limits.  Bladder is unremarkable.  Stomach/Bowel: Stomach is notable for a small hiatal hernia. Contrast in the distal esophagus, suggesting gastroesophageal reflux or esophageal dysmotility.  No evidence of  bowel obstruction.  Normal appendix.  Colonic diverticulosis, with wall thickening/inflammatory changes involving the proximal sigmoid colon (series 2/ image 52), compatible with sigmoid diverticulitis.  No drainable fluid collection/abscess.  No free air.  Vascular/Lymphatic: Atherosclerotic calcifications of the abdominal aorta and branch vessels.  No suspicious abdominopelvic lymphadenopathy.  Reproductive: Status post hysterectomy.  No adnexal masses.  Other: No abdominopelvic ascites.  Musculoskeletal: Degenerative changes of the visualized thoracolumbar spine.  IMPRESSION: Sigmoid diverticulitis.  No drainable fluid collection/abscess.  No free air.   Electronically Signed   By: Julian Hy M.D.   On: 05/24/2014 00:04    Microbiology: No results found for this or any previous visit (from the past 240 hour(s)).   Labs: Basic Metabolic Panel:  Recent Labs Lab 05/23/14 2055 05/24/14 0543  NA 134* 136*  K 3.9 4.3  CL 93* 97  CO2 28 29  GLUCOSE 118* 103*  BUN 8 8  CREATININE 0.73 0.73  CALCIUM 10.0 9.4  MG  --  2.0   PHOS  --  3.1   Liver Function Tests:  Recent Labs Lab 05/23/14 2055 05/24/14 0543  AST 24 21  ALT 15 14  ALKPHOS 93 84  BILITOT 0.4 0.6  PROT 7.7 6.9  ALBUMIN 4.1 3.3*    Recent Labs Lab 05/23/14 2055  LIPASE 32   No results for input(s): AMMONIA in the last 168 hours. CBC:  Recent Labs Lab 05/23/14 2055 05/24/14 0543 05/25/14 0741  WBC 13.3* 11.4* 5.8  NEUTROABS 10.8*  --   --   HGB 11.8* 11.0* 10.4*  HCT 36.3 35.0* 33.2*  MCV 92.1 93.3 94.1  PLT 209 192 173   Cardiac Enzymes: No results for input(s): CKTOTAL, CKMB, CKMBINDEX, TROPONINI in the last 168 hours. BNP: BNP (last 3 results) No results for input(s): PROBNP in the last 8760 hours. CBG: No results for input(s): GLUCAP in the last 168 hours.     Signed:  Velvet Bathe  [facility] 05/25/2014, 4:09 PM

## 2014-05-25 NOTE — Progress Notes (Signed)
D/c to home with family. Instructions and rx's given and patient demonstrated understanding. Pain denied. Breathing regular and unlabored. VSS

## 2014-05-27 ENCOUNTER — Inpatient Hospital Stay (HOSPITAL_COMMUNITY)
Admission: EM | Admit: 2014-05-27 | Discharge: 2014-05-30 | DRG: 392 | Disposition: A | Discharge status: Home / Self Care | Payer: Medicare Other | Attending: Internal Medicine | Admitting: Internal Medicine

## 2014-05-27 ENCOUNTER — Encounter (HOSPITAL_COMMUNITY): Payer: Self-pay | Admitting: Physical Medicine and Rehabilitation

## 2014-05-27 DIAGNOSIS — Z881 Allergy status to other antibiotic agents status: Secondary | ICD-10-CM

## 2014-05-27 DIAGNOSIS — E871 Hypo-osmolality and hyponatremia: Secondary | ICD-10-CM | POA: Diagnosis present

## 2014-05-27 DIAGNOSIS — Z885 Allergy status to narcotic agent status: Secondary | ICD-10-CM

## 2014-05-27 DIAGNOSIS — N312 Flaccid neuropathic bladder, not elsewhere classified: Secondary | ICD-10-CM | POA: Diagnosis present

## 2014-05-27 DIAGNOSIS — Z882 Allergy status to sulfonamides status: Secondary | ICD-10-CM

## 2014-05-27 DIAGNOSIS — K5732 Diverticulitis of large intestine without perforation or abscess without bleeding: Principal | ICD-10-CM | POA: Diagnosis present

## 2014-05-27 DIAGNOSIS — Z880 Allergy status to penicillin: Secondary | ICD-10-CM

## 2014-05-27 DIAGNOSIS — J449 Chronic obstructive pulmonary disease, unspecified: Secondary | ICD-10-CM | POA: Diagnosis present

## 2014-05-27 DIAGNOSIS — R197 Diarrhea, unspecified: Secondary | ICD-10-CM | POA: Diagnosis not present

## 2014-05-27 DIAGNOSIS — Z87891 Personal history of nicotine dependence: Secondary | ICD-10-CM

## 2014-05-27 DIAGNOSIS — M1990 Unspecified osteoarthritis, unspecified site: Secondary | ICD-10-CM | POA: Diagnosis present

## 2014-05-27 DIAGNOSIS — R110 Nausea: Secondary | ICD-10-CM

## 2014-05-27 DIAGNOSIS — E860 Dehydration: Secondary | ICD-10-CM | POA: Diagnosis present

## 2014-05-27 DIAGNOSIS — Z9981 Dependence on supplemental oxygen: Secondary | ICD-10-CM

## 2014-05-27 DIAGNOSIS — E785 Hyperlipidemia, unspecified: Secondary | ICD-10-CM | POA: Diagnosis present

## 2014-05-27 DIAGNOSIS — E039 Hypothyroidism, unspecified: Secondary | ICD-10-CM | POA: Diagnosis present

## 2014-05-27 DIAGNOSIS — K5792 Diverticulitis of intestine, part unspecified, without perforation or abscess without bleeding: Secondary | ICD-10-CM | POA: Diagnosis present

## 2014-05-27 DIAGNOSIS — D649 Anemia, unspecified: Secondary | ICD-10-CM | POA: Diagnosis present

## 2014-05-27 DIAGNOSIS — J45909 Unspecified asthma, uncomplicated: Secondary | ICD-10-CM | POA: Diagnosis present

## 2014-05-27 DIAGNOSIS — J309 Allergic rhinitis, unspecified: Secondary | ICD-10-CM | POA: Diagnosis present

## 2014-05-27 HISTORY — DX: Diarrhea, unspecified: R19.7

## 2014-05-27 LAB — COMPREHENSIVE METABOLIC PANEL
ALBUMIN: 3.5 g/dL (ref 3.5–5.2)
ALK PHOS: 74 U/L (ref 39–117)
ALT: 20 U/L (ref 0–35)
ANION GAP: 12 (ref 5–15)
AST: 37 U/L (ref 0–37)
BILIRUBIN TOTAL: 0.4 mg/dL (ref 0.3–1.2)
BUN: 3 mg/dL — AB (ref 6–23)
CHLORIDE: 94 meq/L — AB (ref 96–112)
CO2: 26 mEq/L (ref 19–32)
Calcium: 9.4 mg/dL (ref 8.4–10.5)
Creatinine, Ser: 0.74 mg/dL (ref 0.50–1.10)
GFR calc Af Amer: >90 mL/min (ref >90)
GFR calc non Af Amer: 79 mL/min — ABNORMAL LOW (ref >90)
Glucose, Bld: 96 mg/dL (ref 70–99)
POTASSIUM: 4.1 meq/L (ref 3.7–5.3)
Sodium: 132 mEq/L — ABNORMAL LOW (ref 137–147)
Total Protein: 6.9 g/dL (ref 6.0–8.3)

## 2014-05-27 LAB — URINALYSIS, ROUTINE W REFLEX MICROSCOPIC
BILIRUBIN URINE: NEGATIVE
Glucose, UA: NEGATIVE mg/dL
Hgb urine dipstick: NEGATIVE
Ketones, ur: NEGATIVE mg/dL
NITRITE: NEGATIVE
PH: 7 (ref 5.0–8.0)
Protein, ur: NEGATIVE mg/dL
SPECIFIC GRAVITY, URINE: 1.004 — AB (ref 1.005–1.030)
Urobilinogen, UA: 0.2 mg/dL (ref 0.0–1.0)

## 2014-05-27 LAB — CBC WITH DIFFERENTIAL/PLATELET
BASOS ABS: 0 10*3/uL (ref 0.0–0.1)
BASOS PCT: 0 % (ref 0–1)
EOS ABS: 0 10*3/uL (ref 0.0–0.7)
Eosinophils Relative: 1 % (ref 0–5)
HCT: 33.3 % — ABNORMAL LOW (ref 36.0–46.0)
HEMOGLOBIN: 10.8 g/dL — AB (ref 12.0–15.0)
Lymphocytes Relative: 24 % (ref 12–46)
Lymphs Abs: 1.1 10*3/uL (ref 0.7–4.0)
MCH: 29.8 pg (ref 26.0–34.0)
MCHC: 32.4 g/dL (ref 30.0–36.0)
MCV: 91.7 fL (ref 78.0–100.0)
MONOS PCT: 14 % — AB (ref 3–12)
Monocytes Absolute: 0.6 10*3/uL (ref 0.1–1.0)
NEUTROS PCT: 61 % (ref 43–77)
Neutro Abs: 2.7 10*3/uL (ref 1.7–7.7)
Platelets: 218 10*3/uL (ref 150–400)
RBC: 3.63 MIL/uL — ABNORMAL LOW (ref 3.87–5.11)
RDW: 12.9 % (ref 11.5–15.5)
WBC: 4.5 10*3/uL (ref 4.0–10.5)

## 2014-05-27 LAB — URINE MICROSCOPIC-ADD ON

## 2014-05-27 MED ORDER — ALPRAZOLAM 0.5 MG PO TABS
1.0000 mg | ORAL_TABLET | Freq: Every day | ORAL | Status: DC
  Administered 2014-05-27 – 2014-05-29 (×3): 1 mg via ORAL
  Filled 2014-05-27 (×3): qty 2

## 2014-05-27 MED ORDER — FLUTICASONE PROPIONATE HFA 44 MCG/ACT IN AERO
2.0000 | INHALATION_SPRAY | Freq: Two times a day (BID) | RESPIRATORY_TRACT | Status: DC
  Administered 2014-05-28 – 2014-05-30 (×4): 2 via RESPIRATORY_TRACT
  Filled 2014-05-27: qty 10.6

## 2014-05-27 MED ORDER — ACETAMINOPHEN 325 MG PO TABS
650.0000 mg | ORAL_TABLET | Freq: Four times a day (QID) | ORAL | Status: DC | PRN
  Administered 2014-05-29: 650 mg via ORAL
  Filled 2014-05-27 (×2): qty 2

## 2014-05-27 MED ORDER — CIPROFLOXACIN IN D5W 400 MG/200ML IV SOLN
400.0000 mg | Freq: Two times a day (BID) | INTRAVENOUS | Status: DC
  Administered 2014-05-28 – 2014-05-29 (×3): 400 mg via INTRAVENOUS
  Filled 2014-05-27 (×4): qty 200

## 2014-05-27 MED ORDER — BENZONATATE 100 MG PO CAPS
100.0000 mg | ORAL_CAPSULE | Freq: Four times a day (QID) | ORAL | Status: DC | PRN
  Filled 2014-05-27: qty 1

## 2014-05-27 MED ORDER — TIZANIDINE HCL 2 MG PO TABS
2.0000 mg | ORAL_TABLET | Freq: Three times a day (TID) | ORAL | Status: DC | PRN
  Filled 2014-05-27: qty 1

## 2014-05-27 MED ORDER — ASPIRIN EC 81 MG PO TBEC
81.0000 mg | DELAYED_RELEASE_TABLET | ORAL | Status: DC
  Administered 2014-05-29: 81 mg via ORAL
  Filled 2014-05-27: qty 1

## 2014-05-27 MED ORDER — LEVOTHYROXINE SODIUM 100 MCG PO TABS
100.0000 ug | ORAL_TABLET | Freq: Every day | ORAL | Status: DC
  Administered 2014-05-28 – 2014-05-30 (×3): 100 ug via ORAL
  Filled 2014-05-27 (×4): qty 1

## 2014-05-27 MED ORDER — HYDROMORPHONE HCL 1 MG/ML IJ SOLN: 0.5000 mg | INTRAMUSCULAR | Status: DC | PRN

## 2014-05-27 MED ORDER — PROMETHAZINE HCL 25 MG PO TABS
25.0000 mg | ORAL_TABLET | Freq: Four times a day (QID) | ORAL | Status: DC | PRN
  Administered 2014-05-29 (×2): 25 mg via ORAL
  Filled 2014-05-27 (×3): qty 1

## 2014-05-27 MED ORDER — CIPROFLOXACIN IN D5W 400 MG/200ML IV SOLN
400.0000 mg | Freq: Once | INTRAVENOUS | Status: AC
  Administered 2014-05-27: 400 mg via INTRAVENOUS
  Filled 2014-05-27: qty 200

## 2014-05-27 MED ORDER — PRAVASTATIN SODIUM 40 MG PO TABS
40.0000 mg | ORAL_TABLET | Freq: Every day | ORAL | Status: DC
  Administered 2014-05-28 – 2014-05-29 (×2): 40 mg via ORAL
  Filled 2014-05-27 (×3): qty 1

## 2014-05-27 MED ORDER — ALBUTEROL SULFATE (5 MG/ML) 0.5% IN NEBU: 2.5000 mg | INHALATION_SOLUTION | Freq: Four times a day (QID) | RESPIRATORY_TRACT | Status: DC | PRN

## 2014-05-27 MED ORDER — SODIUM CHLORIDE 0.9 % IV BOLUS (SEPSIS)
1000.0000 mL | Freq: Once | INTRAVENOUS | Status: AC
Start: 2014-05-27 — End: 2014-05-27
  Administered 2014-05-27: 1000 mL via INTRAVENOUS

## 2014-05-27 MED ORDER — ACETAMINOPHEN ER 650 MG PO TBCR: 650.0000 mg | EXTENDED_RELEASE_TABLET | ORAL | Status: DC | PRN

## 2014-05-27 MED ORDER — ENOXAPARIN SODIUM 40 MG/0.4ML ~~LOC~~ SOLN
40.0000 mg | SUBCUTANEOUS | Status: DC
  Administered 2014-05-27 – 2014-05-29 (×3): 40 mg via SUBCUTANEOUS
  Filled 2014-05-27 (×5): qty 0.4

## 2014-05-27 MED ORDER — ALBUTEROL SULFATE (2.5 MG/3ML) 0.083% IN NEBU: 2.5000 mg | INHALATION_SOLUTION | Freq: Four times a day (QID) | RESPIRATORY_TRACT | Status: DC | PRN

## 2014-05-27 MED ORDER — LOPERAMIDE HCL 2 MG PO CAPS
2.0000 mg | ORAL_CAPSULE | Freq: Every day | ORAL | Status: DC | PRN
  Administered 2014-05-28: 2 mg via ORAL
  Filled 2014-05-27: qty 1

## 2014-05-27 MED ORDER — SODIUM CHLORIDE 0.9 % IV SOLN
INTRAVENOUS | Status: AC
  Administered 2014-05-27: 22:00:00 via INTRAVENOUS

## 2014-05-27 MED ORDER — METRONIDAZOLE IN NACL 5-0.79 MG/ML-% IV SOLN
500.0000 mg | Freq: Once | INTRAVENOUS | Status: AC
  Administered 2014-05-27: 500 mg via INTRAVENOUS
  Filled 2014-05-27: qty 100

## 2014-05-27 MED ORDER — METRONIDAZOLE IN NACL 5-0.79 MG/ML-% IV SOLN
500.0000 mg | Freq: Three times a day (TID) | INTRAVENOUS | Status: DC
  Administered 2014-05-28 – 2014-05-29 (×4): 500 mg via INTRAVENOUS
  Filled 2014-05-27 (×6): qty 100

## 2014-05-27 NOTE — H&P (Signed)
[name] is an 81 y.o. female.    Pcp: Valerie [name]  Chief Complaint: diarrhea HPI: 81 yo female with Copd apparently has recent admission for diarrhea, and then was discharged home on abx Sunday, and then has had more diarrhea today.  Pt has nausea and can't take her cipro and flagyl due to nausea?  Pt denies fever, chills, abd pain, constipation, brbpr, black stool.  Pt will be admitted obs for diarrhea.    Past Medical History  Diagnosis Date  . Allergic rhinitis   . COPD (chronic obstructive pulmonary disease)   . Pelvic cyst   . Bladder atony   . Unspecified hypothyroidism   . Hyperlipidemia   . Non-small cell carcinoma of lung, stage 1 2005    1.4 cm Poorly differentiated Squamous cell RUL  T1N0 resected 09/22/2003  . Arthritis   . Asthma   . Emphysema of lung   . Diarrhea     Past Surgical History  Procedure Laterality Date  . Lung lobectomy  2005    Right Upper Dr Burney  . Abdominal hysterectomy    . Spine surgery    . Needle aspiration pelvic cyst  2011    Dr Dahlstedt  . Cataract extraction      Family History  Problem Relation Age of Onset  . Heart disease Father   . Rheumatologic disease Sister   . Rheumatologic disease Brother   . Cancer Brother     Leukemia   Social History:  reports that she has quit smoking. She does not have any smokeless tobacco history on file. She reports that she does not drink alcohol or use illicit drugs.  Allergies:  Allergies  Allergen Reactions  . Daliresp [Roflumilast] Nausea Only  . Tiotropium Bromide Monohydrate     Urinary retention  . Ampicillin     REACTION: GI upset  . Azithromycin     REACTION: diarrhea  . Codeine   . Fludrocortisone Acetate Itching  . Nitrofurantoin     REACTION: Upset GI  . Penicillins   . Sulfonamide Derivatives      (Not in a hospital admission)  Results for orders placed or performed during the hospital encounter of 05/27/14 (from the past 48 hour(s))  CBC with  Differential     Status: Abnormal   Collection Time: 05/27/14  3:00 PM  Result Value Ref Range   WBC 4.5 4.0 - 10.5 K/uL   RBC 3.63 (L) 3.87 - 5.11 MIL/uL   Hemoglobin 10.8 (L) 12.0 - 15.0 g/dL   HCT 33.3 (L) 36.0 - 46.0 %   MCV 91.7 78.0 - 100.0 fL   MCH 29.8 26.0 - 34.0 pg   MCHC 32.4 30.0 - 36.0 g/dL   RDW 12.9 11.5 - 15.5 %   Platelets 218 150 - 400 K/uL   Neutrophils Relative % 61 43 - 77 %   Neutro Abs 2.7 1.7 - 7.7 K/uL   Lymphocytes Relative 24 12 - 46 %   Lymphs Abs 1.1 0.7 - 4.0 K/uL   Monocytes Relative 14 (H) 3 - 12 %   Monocytes Absolute 0.6 0.1 - 1.0 K/uL   Eosinophils Relative 1 0 - 5 %   Eosinophils Absolute 0.0 0.0 - 0.7 K/uL   Basophils Relative 0 0 - 1 %   Basophils Absolute 0.0 0.0 - 0.1 K/uL  Comprehensive metabolic panel     Status: Abnormal   Collection Time: 05/27/14  3:00 PM  Result Value Ref Range     Sodium 132 (L) 137 - 147 mEq/L   Potassium 4.1 3.7 - 5.3 mEq/L   Chloride 94 (L) 96 - 112 mEq/L   CO2 26 19 - 32 mEq/L   Glucose, Bld 96 70 - 99 mg/dL   BUN 3 (L) 6 - 23 mg/dL   Creatinine, Ser 0.74 0.50 - 1.10 mg/dL   Calcium 9.4 8.4 - 10.5 mg/dL   Total Protein 6.9 6.0 - 8.3 g/dL   Albumin 3.5 3.5 - 5.2 g/dL   AST 37 0 - 37 U/L   ALT 20 0 - 35 U/L   Alkaline Phosphatase 74 39 - 117 U/L   Total Bilirubin 0.4 0.3 - 1.2 mg/dL   GFR calc non Af Amer 79 (L) >90 mL/min   GFR calc Af Amer >90 >90 mL/min    Comment: (NOTE) The eGFR has been calculated using the CKD EPI equation. This calculation has not been validated in all clinical situations. eGFR's persistently <90 mL/min signify possible Chronic Kidney Disease.    Anion gap 12 5 - 15  Urinalysis, Routine w reflex microscopic     Status: Abnormal   Collection Time: 05/27/14  6:19 PM  Result Value Ref Range   Color, Urine YELLOW YELLOW   APPearance CLEAR CLEAR   Specific Gravity, Urine 1.004 (L) 1.005 - 1.030   pH 7.0 5.0 - 8.0   Glucose, UA NEGATIVE NEGATIVE mg/dL   Hgb urine dipstick  NEGATIVE NEGATIVE   Bilirubin Urine NEGATIVE NEGATIVE   Ketones, ur NEGATIVE NEGATIVE mg/dL   Protein, ur NEGATIVE NEGATIVE mg/dL   Urobilinogen, UA 0.2 0.0 - 1.0 mg/dL   Nitrite NEGATIVE NEGATIVE   Leukocytes, UA TRACE (A) NEGATIVE   No results found.  Review of Systems  Constitutional: Negative for fever, chills, weight loss, malaise/fatigue and diaphoresis.  HENT: Negative for congestion, ear discharge, ear pain, hearing loss, nosebleeds, sore throat and tinnitus.   Eyes: Negative for blurred vision, double vision, photophobia, pain, discharge and redness.  Respiratory: Negative for cough, hemoptysis, sputum production, shortness of breath, wheezing and stridor.   Cardiovascular: Negative for chest pain, palpitations, orthopnea, claudication, leg swelling and PND.  Gastrointestinal: Positive for nausea and diarrhea. Negative for heartburn, vomiting, abdominal pain, constipation, blood in stool and melena.  Genitourinary: Negative for dysuria, urgency, frequency, hematuria and flank pain.  Musculoskeletal: Negative for myalgias, back pain, joint pain, falls and neck pain.  Skin: Negative for itching and rash.  Neurological: Negative for dizziness, tingling, tremors, sensory change, speech change, focal weakness, seizures, loss of consciousness, weakness and headaches.  Endo/Heme/Allergies: Negative for environmental allergies and polydipsia. Does not bruise/bleed easily.  Psychiatric/Behavioral: Negative for depression.    Blood pressure 148/94, pulse 84, temperature 97.7 F (36.5 C), resp. rate 18, SpO2 100 %. Physical Exam  Constitutional: She is oriented to person, place, and time. She appears well-developed and well-nourished.  HENT:  Head: Normocephalic and atraumatic.  Eyes: Conjunctivae and EOM are normal. Pupils are equal, round, and reactive to light.  Neck: Normal range of motion. Neck supple.  Cardiovascular: Normal rate and regular rhythm.  Exam reveals no gallop and no  friction rub.   No murmur heard. Respiratory: Effort normal and breath sounds normal.  GI: Soft. Bowel sounds are normal. She exhibits no distension. There is no tenderness. There is no rebound and no guarding.  Musculoskeletal: Normal range of motion. She exhibits no edema or tenderness.  Neurological: She is alert and oriented to person, place, and time. She has normal reflexes.  No cranial nerve deficit. Coordination normal.  Skin: Skin is warm and dry.     Assessment/Plan Diarrhea , hx of diverticulitis Check stool for fecal leukocytes, culture and c. Diff.  Iv cipro and flagyl for now.  immodium 2mg poq day prn  Nausea zofran 4mg iv q6h prn  Anemia Check cbc in am  Hyponatremia secondary to ? Diarrhea Hydrate gently with ns iv Consider further w/up if not resolving  Copd on home o2 Stable  Hypothyroidism: Cont synthyroid  ,  05/27/2014, 6:53 PM    

## 2014-05-27 NOTE — ED Provider Notes (Signed)
CSN: [id number]     Arrival date & time 05/27/14  1429 History   First MD Initiated Contact with Patient 05/27/14 1620     Chief Complaint  Patient presents with  . Abdominal Pain  . Diarrhea     (Consider location/radiation/quality/duration/timing/severity/associated sxs/prior Treatment) HPI Comments: 80 year old female with history of hypothyroidism, COPD, peripheral neuropathy, recent diagnosis of diverticulitis on CT scan and admission for IV antibiotics due to vomiting and pain presents with nausea and recurrent diarrhea since yesterday. Patient was recently discharged and was doing okay and then had a significant diarrhea episode yesterday which improved throughout the day and then this morning and this afternoon had 2 more large episodes. Patient lives at home with her husband who is sick currently. She has not taken her afternoon evening medicines that she has been nauseated despite taking antiemetics. No C. difficile history. No blood in the stools. No worsening abdominal pain that has improved.  Patient is a 80 y.o. female presenting with abdominal pain and diarrhea. The history is provided by the patient.  Abdominal Pain Associated symptoms: diarrhea, fatigue and nausea   Associated symptoms: no chest pain, no chills, no dysuria, no fever, no shortness of breath and no vomiting   Diarrhea Associated symptoms: abdominal pain   Associated symptoms: no chills, no fever, no headaches and no vomiting     Past Medical History  Diagnosis Date  . Allergic rhinitis   . COPD (chronic obstructive pulmonary disease)   . Pelvic cyst   . Bladder atony   . Unspecified hypothyroidism   . Hyperlipidemia   . Non-small cell carcinoma of lung, stage 1 2005    1.4 cm Poorly differentiated Squamous cell RUL  T1N0 resected 09/22/2003  . Arthritis   . Asthma   . Emphysema of lung   . Diarrhea    Past Surgical History  Procedure Laterality Date  . Lung lobectomy  2005    Right Upper Dr Arlyce Dice   . Abdominal hysterectomy    . Spine surgery    . Needle aspiration pelvic cyst  2011    Dr Diona Fanti  . Cataract extraction     Family History  Problem Relation Age of Onset  . Heart disease Father   . Rheumatologic disease Sister   . Rheumatologic disease Brother   . Cancer Brother     Leukemia   History  Substance Use Topics  . Smoking status: Former Research scientist (life sciences)  . Smokeless tobacco: Not on file     Comment: Quit 2001, Smoked x 42 yrs - 1 PPD  . Alcohol Use: No   OB History    No data available     Review of Systems  Constitutional: Positive for fatigue. Negative for fever and chills.  HENT: Negative for congestion.   Eyes: Negative for visual disturbance.  Respiratory: Negative for shortness of breath.   Cardiovascular: Negative for chest pain.  Gastrointestinal: Positive for nausea, abdominal pain and diarrhea. Negative for vomiting and blood in stool.  Genitourinary: Negative for dysuria and flank pain.  Musculoskeletal: Negative for back pain, neck pain and neck stiffness.  Skin: Negative for rash.  Neurological: Negative for light-headedness and headaches.      Allergies  Daliresp; Tiotropium bromide monohydrate; Ampicillin; Azithromycin; Codeine; Fludrocortisone acetate; Nitrofurantoin; Penicillins; and Sulfonamide derivatives  Home Medications   Prior to Admission medications   Medication Sig Start Date End Date Taking? Authorizing Provider  acetaminophen (TYLENOL ARTHRITIS PAIN) 650 MG CR tablet Take 650 mg by mouth as  needed for pain.   Yes Historical Provider, MD  ALPRAZolam Duanne Moron) 1 MG tablet Take 1 tablet (1 mg total) by mouth at bedtime. 05/19/14  Yes Rowe Clack, MD  beclomethasone (QVAR) 80 MCG/ACT inhaler Inhale 2 puffs into the lungs 2 (two) times daily. 04/10/14  Yes Rowe Clack, MD  ciprofloxacin (CIPRO) 500 MG tablet Take 1 tablet (500 mg total) by mouth 2 (two) times daily. 05/25/14  Yes Velvet Bathe, MD  levalbuterol (XOPENEX) 1.25  MG/3ML nebulizer solution Take 1.25 mg by nebulization every 8 (eight) hours as needed for wheezing or shortness of breath.   Yes Historical Provider, MD  levothyroxine (SYNTHROID, LEVOTHROID) 100 MCG tablet Take 1 tablet (100 mcg total) by mouth daily before breakfast. 05/02/14  Yes Rowe Clack, MD  lovastatin (MEVACOR) 40 MG tablet Take 1 tablet (40 mg total) by mouth at bedtime. 05/01/14  Yes Rowe Clack, MD  metroNIDAZOLE (FLAGYL) 500 MG tablet Take 1 tablet (500 mg total) by mouth 3 (three) times daily. 05/25/14  Yes Velvet Bathe, MD  Multiple Vitamins-Minerals (CENTRUM SILVER PO) Take 1 tablet by mouth daily.    Yes Historical Provider, MD  ondansetron (ZOFRAN) 4 MG tablet Take 1 tablet (4 mg total) by mouth every 6 (six) hours as needed for nausea. 05/25/14  Yes Velvet Bathe, MD  Respiratory Therapy Supplies (FLUTTER) DEVI Blow through 4 times per cycle and repeat 3 cycles per day 10/15/13  Yes Deneise Lever, MD  aspirin EC 81 MG tablet Take 81 mg by mouth 2 (two) times a week.     Historical Provider, MD  benzonatate (TESSALON) 100 MG capsule Take 1 capsule (100 mg total) by mouth every 6 (six) hours as needed for cough. Patient not taking: Reported on 05/23/2014 07/04/13   Deneise Lever, MD  promethazine (PHENERGAN) 25 MG tablet Take 1 tablet (25 mg total) by mouth every 6 (six) hours as needed for nausea or vomiting. Patient not taking: Reported on 05/27/2014 04/10/14   Deneise Lever, MD  tiZANidine (ZANAFLEX) 2 MG tablet Take 1 tablet (2 mg total) by mouth every 8 (eight) hours as needed for muscle spasms. Patient not taking: Reported on 05/23/2014 04/30/14   Rowe Clack, MD   BP 152/70 mmHg  Pulse 87  Temp(Src) 97.8 F (36.6 C) (Oral)  Resp 20  Ht 5\' 7"  (1.702 m)  Wt 153 lb 3.5 oz (69.5 kg)  BMI 23.99 kg/m2  SpO2 100% Physical Exam  Constitutional: She is oriented to person, place, and time. She appears well-developed and well-nourished.  HENT:  Head:  Normocephalic and atraumatic.  Mild dry mucous membranes  Eyes: Conjunctivae are normal. Right eye exhibits no discharge. Left eye exhibits no discharge.  Neck: Normal range of motion. Neck supple. No tracheal deviation present.  Cardiovascular: Normal rate and regular rhythm.   Pulmonary/Chest: Effort normal and breath sounds normal.  Abdominal: Soft. She exhibits no distension. There is tenderness (mild tenderness left lower quadrant no guarding). There is no guarding.  Musculoskeletal: She exhibits no edema.  Neurological: She is alert and oriented to person, place, and time.  Skin: Skin is warm. No rash noted.  Psychiatric: She has a normal mood and affect.  Nursing note and vitals reviewed.   ED Course  Procedures (including critical care time) Labs Review Labs Reviewed  CBC WITH DIFFERENTIAL - Abnormal; Notable for the following:    RBC 3.63 (*)    Hemoglobin 10.8 (*)    HCT 33.3 (*)  Monocytes Relative 14 (*)    All other components within normal limits  COMPREHENSIVE METABOLIC PANEL - Abnormal; Notable for the following:    Sodium 132 (*)    Chloride 94 (*)    BUN 3 (*)    GFR calc non Af Amer 79 (*)    All other components within normal limits  URINALYSIS, ROUTINE W REFLEX MICROSCOPIC - Abnormal; Notable for the following:    Specific Gravity, Urine 1.004 (*)    Leukocytes, UA TRACE (*)    All other components within normal limits  CLOSTRIDIUM DIFFICILE BY PCR  STOOL CULTURE  URINE MICROSCOPIC-ADD ON  CBC WITH DIFFERENTIAL  COMPREHENSIVE METABOLIC PANEL  FECAL LACTOFERRIN    Imaging Review No results found.   EKG Interpretation None      MDM   Final diagnoses:  Acute diverticulitis  Diarrhea  Nausea   Patient presents with recurrent diarrhea and nausea, known diverticulitis history. Abdominal exam benign and pain is improving clinically I do not feel there is an abscess that is formed and I do not feel emergent CT scans indicated today. Blood work  reviewed unremarkable. Plan for IV fluids, IV antibiotics for diverticulitis and reassessment. Patient concerned about going home she does not have much help with her husband being sick. Page triad hospitalist discussed observation for IV antibiotics IV fluids. C. difficile pending.  Triad hospitalist evaluated and admitted the patient.  The patients results and plan were reviewed and discussed.   Any x-rays performed were personally reviewed by myself.   Differential diagnosis were considered with the presenting HPI.  Medications  benzonatate (TESSALON) capsule 100 mg (not administered)  promethazine (PHENERGAN) tablet 25 mg (not administered)  tiZANidine (ZANAFLEX) tablet 2 mg (not administered)  ALPRAZolam (XANAX) tablet 1 mg (1 mg Oral Given 05/27/14 2300)  levothyroxine (SYNTHROID, LEVOTHROID) tablet 100 mcg (not administered)  pravastatin (PRAVACHOL) tablet 40 mg (not administered)  fluticasone (FLOVENT HFA) 44 MCG/ACT inhaler 2 puff (not administered)  aspirin EC tablet 81 mg (not administered)  enoxaparin (LOVENOX) injection 40 mg (40 mg Subcutaneous Given 05/27/14 2338)  0.9 %  sodium chloride infusion ( Intravenous New Bag/Given 05/27/14 2144)  HYDROmorphone (DILAUDID) injection 0.5 mg (not administered)  loperamide (IMODIUM) capsule 2 mg (not administered)  ciprofloxacin (CIPRO) IVPB 400 mg (not administered)  metroNIDAZOLE (FLAGYL) IVPB 500 mg (not administered)  albuterol (PROVENTIL) (2.5 MG/3ML) 0.083% nebulizer solution 2.5 mg (not administered)  acetaminophen (TYLENOL) tablet 650 mg (not administered)  sodium chloride 0.9 % bolus 1,000 mL (0 mLs Intravenous Stopped 05/27/14 1945)  ciprofloxacin (CIPRO) IVPB 400 mg (0 mg Intravenous Stopped 05/27/14 2038)  metroNIDAZOLE (FLAGYL) IVPB 500 mg (0 mg Intravenous Stopped 05/27/14 1921)    Filed Vitals:   05/27/14 1745 05/27/14 1812 05/27/14 1845 05/27/14 2100  BP: 160/70 148/94 115/82 152/70  Pulse: 78 84 83 87  Temp:    97.8  F (36.6 C)  TempSrc:    Oral  Resp:  18  20  Height:    5\' 7"  (1.702 m)  Weight:    153 lb 3.5 oz (69.5 kg)  SpO2: 96% 100% 100% 100%    Final diagnoses:  Acute diverticulitis  Diarrhea  Nausea    Admission/ observation were discussed with the admitting physician, patient and/or family and they are comfortable with the plan.        Mariea Clonts, MD 05/28/14 [phone]

## 2014-05-27 NOTE — ED Notes (Signed)
Attempted report 

## 2014-05-27 NOTE — ED Notes (Signed)
Pt presents to department for evaluation of continued diarrhea. Pt states she was recently diagnosed with diverticulitis. States she began taking prescribed antibiotics yesterday and continues to have diarrhea. Denies pain. Pt is alert and oriented x4. NAD.

## 2014-05-28 ENCOUNTER — Telehealth: Payer: Self-pay | Admitting: *Deleted

## 2014-05-28 DIAGNOSIS — J449 Chronic obstructive pulmonary disease, unspecified: Secondary | ICD-10-CM | POA: Diagnosis present

## 2014-05-28 DIAGNOSIS — E860 Dehydration: Secondary | ICD-10-CM | POA: Diagnosis present

## 2014-05-28 DIAGNOSIS — Z87891 Personal history of nicotine dependence: Secondary | ICD-10-CM | POA: Diagnosis not present

## 2014-05-28 DIAGNOSIS — D649 Anemia, unspecified: Secondary | ICD-10-CM | POA: Diagnosis present

## 2014-05-28 DIAGNOSIS — Z880 Allergy status to penicillin: Secondary | ICD-10-CM | POA: Diagnosis not present

## 2014-05-28 DIAGNOSIS — E039 Hypothyroidism, unspecified: Secondary | ICD-10-CM | POA: Diagnosis present

## 2014-05-28 DIAGNOSIS — K5732 Diverticulitis of large intestine without perforation or abscess without bleeding: Secondary | ICD-10-CM | POA: Diagnosis present

## 2014-05-28 DIAGNOSIS — R197 Diarrhea, unspecified: Secondary | ICD-10-CM | POA: Diagnosis present

## 2014-05-28 DIAGNOSIS — J309 Allergic rhinitis, unspecified: Secondary | ICD-10-CM | POA: Diagnosis present

## 2014-05-28 DIAGNOSIS — K5712 Diverticulitis of small intestine without perforation or abscess without bleeding: Secondary | ICD-10-CM

## 2014-05-28 DIAGNOSIS — Z885 Allergy status to narcotic agent status: Secondary | ICD-10-CM | POA: Diagnosis not present

## 2014-05-28 DIAGNOSIS — Z9981 Dependence on supplemental oxygen: Secondary | ICD-10-CM | POA: Diagnosis not present

## 2014-05-28 DIAGNOSIS — M1990 Unspecified osteoarthritis, unspecified site: Secondary | ICD-10-CM | POA: Diagnosis present

## 2014-05-28 DIAGNOSIS — Z882 Allergy status to sulfonamides status: Secondary | ICD-10-CM | POA: Diagnosis not present

## 2014-05-28 DIAGNOSIS — N312 Flaccid neuropathic bladder, not elsewhere classified: Secondary | ICD-10-CM | POA: Diagnosis present

## 2014-05-28 DIAGNOSIS — E871 Hypo-osmolality and hyponatremia: Secondary | ICD-10-CM | POA: Diagnosis present

## 2014-05-28 DIAGNOSIS — E785 Hyperlipidemia, unspecified: Secondary | ICD-10-CM | POA: Diagnosis present

## 2014-05-28 DIAGNOSIS — J45909 Unspecified asthma, uncomplicated: Secondary | ICD-10-CM | POA: Diagnosis present

## 2014-05-28 DIAGNOSIS — Z881 Allergy status to other antibiotic agents status: Secondary | ICD-10-CM | POA: Diagnosis not present

## 2014-05-28 LAB — COMPREHENSIVE METABOLIC PANEL
ALT: 19 U/L (ref 0–35)
AST: 39 U/L — AB (ref 0–37)
Albumin: 2.9 g/dL — ABNORMAL LOW (ref 3.5–5.2)
Alkaline Phosphatase: 62 U/L (ref 39–117)
Anion gap: 12 (ref 5–15)
BUN: <3 mg/dL — ABNORMAL LOW (ref 6–23)
CALCIUM: 8.8 mg/dL (ref 8.4–10.5)
CO2: 23 meq/L (ref 19–32)
Chloride: 105 mEq/L (ref 96–112)
Creatinine, Ser: 0.65 mg/dL (ref 0.50–1.10)
GFR calc non Af Amer: 82 mL/min — ABNORMAL LOW (ref >90)
Glucose, Bld: 86 mg/dL (ref 70–99)
Potassium: 3.9 mEq/L (ref 3.7–5.3)
SODIUM: 140 meq/L (ref 137–147)
TOTAL PROTEIN: 5.9 g/dL — AB (ref 6.0–8.3)
Total Bilirubin: 0.3 mg/dL (ref 0.3–1.2)

## 2014-05-28 LAB — CBC WITH DIFFERENTIAL/PLATELET
Basophils Absolute: 0 10*3/uL (ref 0.0–0.1)
Basophils Relative: 0 % (ref 0–1)
EOS ABS: 0 10*3/uL (ref 0.0–0.7)
Eosinophils Relative: 1 % (ref 0–5)
HCT: 31.3 % — ABNORMAL LOW (ref 36.0–46.0)
Hemoglobin: 10.1 g/dL — ABNORMAL LOW (ref 12.0–15.0)
LYMPHS PCT: 36 % (ref 12–46)
Lymphs Abs: 1.4 10*3/uL (ref 0.7–4.0)
MCH: 29.6 pg (ref 26.0–34.0)
MCHC: 32.3 g/dL (ref 30.0–36.0)
MCV: 91.8 fL (ref 78.0–100.0)
Monocytes Absolute: 0.7 10*3/uL (ref 0.1–1.0)
Monocytes Relative: 17 % — ABNORMAL HIGH (ref 3–12)
Neutro Abs: 1.8 10*3/uL (ref 1.7–7.7)
Neutrophils Relative %: 46 % (ref 43–77)
PLATELETS: 202 10*3/uL (ref 150–400)
RBC: 3.41 MIL/uL — AB (ref 3.87–5.11)
RDW: 13.1 % (ref 11.5–15.5)
WBC: 4 10*3/uL (ref 4.0–10.5)

## 2014-05-28 LAB — CLOSTRIDIUM DIFFICILE BY PCR: Toxigenic C. Difficile by PCR: NEGATIVE

## 2014-05-28 MED ORDER — CETYLPYRIDINIUM CHLORIDE 0.05 % MT LIQD
7.0000 mL | Freq: Two times a day (BID) | OROMUCOSAL | Status: DC
  Administered 2014-05-28 – 2014-05-30 (×4): 7 mL via OROMUCOSAL

## 2014-05-28 NOTE — Telephone Encounter (Signed)
[facility] Patient Name: [name] Gender: Female DOB: 07-26-1932 Age: 80 Y 26 M 13 D Return Phone Number: [phone] (Primary) Address: [address] City/State/Zip: [place]  [address] Client [place] [facility] - Client Client Site [facility] - Day Physician Gwendolyn Grant Contact Type Call Call Type Triage / Clinical Relationship To Patient Self Return Phone Number [phone] (Primary) Chief Complaint Diarrhea Initial Comment Caller states she has been over in the hospital at [facility] for divertcutis and was in pain. Sunday she woke up with diarrhea. She is at home now and cannot take antibiotics without having diarrhea. This morning it has happened again after drinking coffee and chicken broth. PreDisposition Go to ED Nurse Assessment Nurse: Markus Daft, RN, Sherre Poot Date/Time Eilene Ghazi Time): 05/27/2014 12:53:47 PM Confirm and document reason for call. If symptomatic, describe symptoms. ---Caller states she has been over in the hospital at [facility] for diverticulitis and was having abdominal pain on Friday and given IV antibiotics and discharged Sunday. Sunday she woke up with watery diarrhea, incontinent at times. She is at home now, only on clear liquids, and then had watery diarrhea again last night like in hospital - sat there for 2 hours. Ciprofloxacin 500 mg BID and Flagyl 500 mg TID. She cannot take antibiotics by mouth without having diarrhea. This morning it has happened again after drinking coffee and chicken broth. Has the patient traveled out of the country within the last 30 days? ---No Does the patient require triage? ---Yes Related visit to physician within the last 2 weeks? ---Yes Does the PT have any chronic conditions? (i.e. diabetes, asthma, etc.) ---Yes List chronic conditions. ---Diverticulitis Guidelines Guideline Title Affirmed Question  Affirmed Notes Nurse Date/Time (Eastern Time) Diarrhea [1] Age > 60 years AND [2] > 6 diarrhea stools in past 24 hours > 6 episodes Markus Daft, RN, Windy 05/27/2014 12:56:31 PM Disp. Time Eilene Ghazi Time) Disposition Final User PLEASE NOTE: All timestamps contained within this report are represented as [place] Standard Time. CONFIDENTIALTY NOTICE: This fax transmission is intended only for the addressee. It contains information that is legally privileged, confidential or otherwise protected from use or disclosure. If you are not the intended recipient, you are strictly prohibited from reviewing, disclosing, copying using or disseminating any of this information or taking any action in reliance on or regarding this information. If you have received this fax in error, please notify us immediately by telephone so that we can arrange for its return to [place]. Phone: [phone], Toll-Free: [phone], Fax: [phone] Page: 2 of 2 Call Id: [id number] 05/27/2014 1:03:55 PM See Physician within 4 Hours (or PCP triage) Yes Markus Daft, RN, Kenton Kingfisher Understands: Yes Disagree/Comply: Comply Care Advice Given Per Guideline SEE PHYSICIAN WITHIN 4 HOURS (or PCP triage): * IF NO PCP TRIAGE: You need to be seen. Go to _______________ (ED/UCC or office if it will be open) within the next 3 or 4 hours. Go sooner if you become worse. CLEAR FLUIDS: * Drink more fluids. * Sip water or a sports - rehydration drink (Gatorade or Powerade) CALL BACK IF: * You become worse. CARE ADVICE given per Diarrhea (Adult) guideline. Comments User: Mayford Knife, RN Date/Time Eilene Ghazi Time): 05/27/2014 1:09:41 PM RN contacted office (per directive) and office staff, Malachy Mood, made aware that pt can see any MD in office within 4 hours. She checked MD's schedules, and nothing available. -- RN advised that she go on to the ER. She  will go to [place]. Referrals REFERRED TO PCP OFFICE

## 2014-05-28 NOTE — Care Management Note (Addendum)
    Page 1 of 1   05/30/2014     6:10:30 PM CARE MANAGEMENT NOTE 05/30/2014  Patient:  [name], [name]   Account Number:  [id number]  Date Initiated:  05/28/2014  Documentation initiated by:  Tomi Bamberger  Subjective/Objective Assessment:   dx diverticulitis  admit- from home.     Action/Plan:   Anticipated DC Date:  05/29/2014   Anticipated DC Plan:  [place]  CM consult      Choice offered to / List presented to:             Status of service:  Completed, signed off Medicare Important Message given?  YES (If response is "NO", the following Medicare IM given date fields will be blank) Date Medicare IM given:  05/30/2014 Medicare IM given by:  Tomi Bamberger Date Additional Medicare IM given:   Additional Medicare IM given by:    Discharge Disposition:  HOME/SELF CARE  Per UR Regulation:  Reviewed for med. necessity/level of care/duration of stay  If discussed at [place] of Stay Meetings, dates discussed:    Comments:  05/28/14 [place] [phone] patient is from home, no needs anticipated.

## 2014-05-28 NOTE — Progress Notes (Signed)
UR completed 

## 2014-05-28 NOTE — Progress Notes (Signed)
[facility] PROGRESS NOTE   [name] TJQ:[id number] DOB: 06/16/1933 DOA: 05/27/2014 PCP: Gwendolyn Grant, MD  HPI/Subjective: Complains about nausea, mild abdominal pain and diarrhea. No vomiting.  Assessment/Plan: Principal Problem:   Nausea Active Problems:   Diverticulitis   Diarrhea    Acute diverticulitis Patient was discharged from the hospital on 12/6 to continue Cipro and Flagyl at home. Patient is complaining about nausea and continuing diarrhea. Patient came back to the hospital for further evaluation. C. difficile PCR is negative.  Continue same antibiotics of Cipro and Flagyl. Was on clear liquids, advance to full liquids, I explained to her she will have some diarrhea and abdominal pain which is expected with diverticulitis.  Anemia Patient has chronic anemia, denies any bleeding.  Hyponatremia Presented with sodium of 132, likely secondary to dehydration.  This is resolved after hydration with IV fluids.  COPD Stable, patient is on home oxygen, continue.   Code Status: Full code  Family Communication: Plan discussed with the patient. Disposition Plan: Remains inpatient   Consultants:  None   Procedures:  None   Antibiotics:  Cipro and Flagyl   Objective: Filed Vitals:   05/28/14 0603  BP: 147/66  Pulse: 72  Temp: 98.2 F (36.8 C)  Resp: 17    Intake/Output Summary (Last 24 hours) at 05/28/14 1244 Last data filed at 05/28/14 1029  Gross per 24 hour  Intake   1215 ml  Output   2402 ml  Net  -1187 ml   Filed Weights   05/27/14 2100 05/28/14 0608  Weight: 69.5 kg (153 lb 3.5 oz) 69.083 kg (152 lb 4.8 oz)    Exam: General: Alert and awake, oriented x3, not in any acute distress. HEENT: anicteric sclera, pupils reactive to light and accommodation, EOMI CVS: S1-S2 clear, no murmur rubs or gallops Chest: clear to auscultation bilaterally, no wheezing, rales or rhonchi Abdomen: soft nontender, nondistended, normal  bowel sounds, no organomegaly Extremities: no cyanosis, clubbing or edema noted bilaterally Neuro: Cranial nerves II-XII intact, no focal neurological deficits  Data Reviewed: Basic Metabolic Panel:  Recent Labs Lab 05/23/14 2055 05/24/14 0543 05/27/14 1500 05/28/14 0500  NA 134* 136* 132* 140  K 3.9 4.3 4.1 3.9  CL 93* 97 94* 105  CO2 28 29 26 23   GLUCOSE 118* 103* 96 86  BUN 8 8 3* <3*  CREATININE 0.73 0.73 0.74 0.65  CALCIUM 10.0 9.4 9.4 8.8  MG  --  2.0  --   --   PHOS  --  3.1  --   --    Liver Function Tests:  Recent Labs Lab 05/23/14 2055 05/24/14 0543 05/27/14 1500 05/28/14 0500  AST 24 21 37 39*  ALT 15 14 20 19   ALKPHOS 93 84 74 62  BILITOT 0.4 0.6 0.4 0.3  PROT 7.7 6.9 6.9 5.9*  ALBUMIN 4.1 3.3* 3.5 2.9*    Recent Labs Lab 05/23/14 2055  LIPASE 32   No results for input(s): AMMONIA in the last 168 hours. CBC:  Recent Labs Lab 05/23/14 2055 05/24/14 0543 05/25/14 0741 05/27/14 1500 05/28/14 0500  WBC 13.3* 11.4* 5.8 4.5 4.0  NEUTROABS 10.8*  --   --  2.7 1.8  HGB 11.8* 11.0* 10.4* 10.8* 10.1*  HCT 36.3 35.0* 33.2* 33.3* 31.3*  MCV 92.1 93.3 94.1 91.7 91.8  PLT 209 192 173 218 202   Cardiac Enzymes: No results for input(s): CKTOTAL, CKMB, CKMBINDEX, TROPONINI in the last 168 hours. BNP (last 3 results) No results  for input(s): PROBNP in the last 8760 hours. CBG: No results for input(s): GLUCAP in the last 168 hours.  Micro Recent Results (from the past 240 hour(s))  Clostridium Difficile by PCR     Status: None   Collection Time: 05/27/14  6:19 PM  Result Value Ref Range Status   C difficile by pcr NEGATIVE NEGATIVE Final     Studies: No results found.  Scheduled Meds: . ALPRAZolam  1 mg Oral QHS  . [START ON 05/29/2014] aspirin EC  81 mg Oral Once per day on Mon Thu  . ciprofloxacin  400 mg Intravenous Q12H  . enoxaparin (LOVENOX) injection  40 mg Subcutaneous Q24H  . fluticasone  2 puff Inhalation BID  . levothyroxine   100 mcg Oral QAC breakfast  . metronidazole  500 mg Intravenous Q8H  . pravastatin  40 mg Oral q1800   Continuous Infusions: . sodium chloride 75 mL/hr at 05/27/14 2144       Time spent: 35 minutes    [facility] Pager [phone] If 7PM-7AM, please contact night-coverage at [email or web address], password [facility] 05/28/2014, 12:44 PM  LOS: 1 day

## 2014-05-28 NOTE — Plan of Care (Signed)
Problem: Phase I Progression Outcomes Goal: Voiding-avoid urinary catheter unless indicated Outcome: Completed/Met Date Met:  05/28/14     

## 2014-05-28 NOTE — Plan of Care (Signed)
Problem: Phase I Progression Outcomes Goal: Pain controlled with appropriate interventions Outcome: Progressing     

## 2014-05-29 LAB — BASIC METABOLIC PANEL
ANION GAP: 8 (ref 5–15)
BUN: <3 mg/dL — ABNORMAL LOW (ref 6–23)
CO2: 28 meq/L (ref 19–32)
Calcium: 9.1 mg/dL (ref 8.4–10.5)
Chloride: 106 mEq/L (ref 96–112)
Creatinine, Ser: 0.65 mg/dL (ref 0.50–1.10)
GFR calc Af Amer: >90 mL/min (ref >90)
GFR calc non Af Amer: 82 mL/min — ABNORMAL LOW (ref >90)
Glucose, Bld: 87 mg/dL (ref 70–99)
Potassium: 3.8 mEq/L (ref 3.7–5.3)
Sodium: 142 mEq/L (ref 137–147)

## 2014-05-29 LAB — FECAL LACTOFERRIN, QUANT: FECAL LACTOFERRIN: POSITIVE

## 2014-05-29 LAB — CBC
HCT: 33.5 % — ABNORMAL LOW (ref 36.0–46.0)
HEMOGLOBIN: 10.9 g/dL — AB (ref 12.0–15.0)
MCH: 30.6 pg (ref 26.0–34.0)
MCHC: 32.5 g/dL (ref 30.0–36.0)
MCV: 94.1 fL (ref 78.0–100.0)
Platelets: 206 10*3/uL (ref 150–400)
RBC: 3.56 MIL/uL — ABNORMAL LOW (ref 3.87–5.11)
RDW: 13 % (ref 11.5–15.5)
WBC: 3.5 10*3/uL — AB (ref 4.0–10.5)

## 2014-05-29 MED ORDER — METRONIDAZOLE 500 MG PO TABS
500.0000 mg | ORAL_TABLET | Freq: Three times a day (TID) | ORAL | Status: DC
  Administered 2014-05-29 – 2014-05-30 (×4): 500 mg via ORAL
  Filled 2014-05-29 (×6): qty 1

## 2014-05-29 MED ORDER — CIPROFLOXACIN HCL 500 MG PO TABS
500.0000 mg | ORAL_TABLET | Freq: Two times a day (BID) | ORAL | Status: DC
  Administered 2014-05-29 – 2014-05-30 (×2): 500 mg via ORAL
  Filled 2014-05-29 (×4): qty 1

## 2014-05-29 NOTE — Progress Notes (Signed)
[facility] PROGRESS NOTE   [name] PPI:[id number] DOB: 04/13/1933 DOA: 05/27/2014 PCP: Gwendolyn Grant, MD  HPI/Subjective: Continues to have diarrhea and mild abdominal pain. No vomiting.  Assessment/Plan: Principal Problem:   Nausea Active Problems:   Diverticulitis   Diarrhea    Acute diverticulitis Patient was discharged from the hospital on 12/6 to continue Cipro and Flagyl at home. Patient is complaining about nausea and continuing diarrhea. Patient came back to the hospital for further evaluation. C. difficile PCR is negative.  Continue same antibiotics of Cipro and Flagyl, switched to oral form. Patient tolerated full liquids very well, will advance to soft diet today.  Anemia Patient has chronic anemia, stable, denies any bleeding.  Hyponatremia Presented with sodium of 132, likely secondary to dehydration.  This is resolved after hydration with IV fluids.  COPD Stable, patient is on home oxygen, continue.   Code Status: Full code  Family Communication: Plan discussed with the patient. Disposition Plan: Remains inpatient   Consultants:  None   Procedures:   None   Antibiotics:  Cipro and Flagyl   Objective: Filed Vitals:   05/29/14 0526  BP: 132/60  Pulse: 70  Temp: 98.4 F (36.9 C)  Resp: 18    Intake/Output Summary (Last 24 hours) at 05/29/14 1018 Last data filed at 05/29/14 0931  Gross per 24 hour  Intake   2300 ml  Output   4054 ml  Net  -1754 ml   Filed Weights   05/27/14 2100 05/28/14 0608  Weight: 69.5 kg (153 lb 3.5 oz) 69.083 kg (152 lb 4.8 oz)    Exam: General: Alert and awake, oriented x3, not in any acute distress. HEENT: anicteric sclera, pupils reactive to light and accommodation, EOMI CVS: S1-S2 clear, no murmur rubs or gallops Chest: clear to auscultation bilaterally, no wheezing, rales or rhonchi Abdomen: soft nontender, nondistended, normal bowel sounds, no organomegaly Extremities: no  cyanosis, clubbing or edema noted bilaterally Neuro: Cranial nerves II-XII intact, no focal neurological deficits  Data Reviewed: Basic Metabolic Panel:  Recent Labs Lab 05/23/14 2055 05/24/14 0543 05/27/14 1500 05/28/14 0500 05/29/14 0718  NA 134* 136* 132* 140 142  K 3.9 4.3 4.1 3.9 3.8  CL 93* 97 94* 105 106  CO2 28 29 26 23 28   GLUCOSE 118* 103* 96 86 87  BUN 8 8 3* <3* <3*  CREATININE 0.73 0.73 0.74 0.65 0.65  CALCIUM 10.0 9.4 9.4 8.8 9.1  MG  --  2.0  --   --   --   PHOS  --  3.1  --   --   --    Liver Function Tests:  Recent Labs Lab 05/23/14 2055 05/24/14 0543 05/27/14 1500 05/28/14 0500  AST 24 21 37 39*  ALT 15 14 20 19   ALKPHOS 93 84 74 62  BILITOT 0.4 0.6 0.4 0.3  PROT 7.7 6.9 6.9 5.9*  ALBUMIN 4.1 3.3* 3.5 2.9*    Recent Labs Lab 05/23/14 2055  LIPASE 32   No results for input(s): AMMONIA in the last 168 hours. CBC:  Recent Labs Lab 05/23/14 2055 05/24/14 0543 05/25/14 0741 05/27/14 1500 05/28/14 0500 05/29/14 0718  WBC 13.3* 11.4* 5.8 4.5 4.0 3.5*  NEUTROABS 10.8*  --   --  2.7 1.8  --   HGB 11.8* 11.0* 10.4* 10.8* 10.1* 10.9*  HCT 36.3 35.0* 33.2* 33.3* 31.3* 33.5*  MCV 92.1 93.3 94.1 91.7 91.8 94.1  PLT 209 192 173 218 202 206   Cardiac Enzymes:  No results for input(s): CKTOTAL, CKMB, CKMBINDEX, TROPONINI in the last 168 hours. BNP (last 3 results) No results for input(s): PROBNP in the last 8760 hours. CBG: No results for input(s): GLUCAP in the last 168 hours.  Micro Recent Results (from the past 240 hour(s))  Clostridium Difficile by PCR     Status: None   Collection Time: 05/27/14  6:19 PM  Result Value Ref Range Status   C difficile by pcr NEGATIVE NEGATIVE Final  Stool culture     Status: None (Preliminary result)   Collection Time: 05/28/14  1:20 AM  Result Value Ref Range Status   Specimen Description STOOL  Final   Special Requests NONE  Final   Culture   Final    Culture reincubated for better  growth Performed at [facility]    Report Status PENDING  Incomplete     Studies: No results found.  Scheduled Meds: . ALPRAZolam  1 mg Oral QHS  . antiseptic oral rinse  7 mL Mouth Rinse BID  . aspirin EC  81 mg Oral Once per day on Mon Thu  . ciprofloxacin  400 mg Intravenous Q12H  . enoxaparin (LOVENOX) injection  40 mg Subcutaneous Q24H  . fluticasone  2 puff Inhalation BID  . levothyroxine  100 mcg Oral QAC breakfast  . metronidazole  500 mg Intravenous Q8H  . pravastatin  40 mg Oral q1800   Continuous Infusions:       Time spent: 35 minutes    [facility] A  [facility] Pager [phone] If 7PM-7AM, please contact night-coverage at [email or web address], password [facility] 05/29/2014, 10:18 AM  LOS: 2 days

## 2014-05-29 NOTE — Plan of Care (Signed)
Problem: Phase I Progression Outcomes Goal: OOB as tolerated unless otherwise ordered Outcome: Completed/Met Date Met:  05/29/14     

## 2014-05-30 NOTE — Progress Notes (Signed)
Nsg Discharge Note  Admit Date:  05/27/2014 Discharge date: 05/30/2014   [name] to be D/C'd Home per MD order.  AVS completed.  Copy for chart, and copy for patient signed, and dated. Patient/caregiver able to verbalize understanding.  Discharge Medication:   Medication List    TAKE these medications        ALPRAZolam 1 MG tablet  Commonly known as:  XANAX  Take 1 tablet (1 mg total) by mouth at bedtime.     aspirin EC 81 MG tablet  Take 81 mg by mouth 2 (two) times a week.     beclomethasone 80 MCG/ACT inhaler  Commonly known as:  QVAR  Inhale 2 puffs into the lungs 2 (two) times daily.     benzonatate 100 MG capsule  Commonly known as:  TESSALON  Take 1 capsule (100 mg total) by mouth every 6 (six) hours as needed for cough.     CENTRUM SILVER PO  Take 1 tablet by mouth daily.     ciprofloxacin 500 MG tablet  Commonly known as:  CIPRO  Take 1 tablet (500 mg total) by mouth 2 (two) times daily.     FLUTTER Devi  Blow through 4 times per cycle and repeat 3 cycles per day     levalbuterol 1.25 MG/3ML nebulizer solution  Commonly known as:  XOPENEX  Take 1.25 mg by nebulization every 8 (eight) hours as needed for wheezing or shortness of breath.     levothyroxine 100 MCG tablet  Commonly known as:  SYNTHROID, LEVOTHROID  Take 1 tablet (100 mcg total) by mouth daily before breakfast.     lovastatin 40 MG tablet  Commonly known as:  MEVACOR  Take 1 tablet (40 mg total) by mouth at bedtime.     metroNIDAZOLE 500 MG tablet  Commonly known as:  FLAGYL  Take 1 tablet (500 mg total) by mouth 3 (three) times daily.     ondansetron 4 MG tablet  Commonly known as:  ZOFRAN  Take 1 tablet (4 mg total) by mouth every 6 (six) hours as needed for nausea.     promethazine 25 MG tablet  Commonly known as:  PHENERGAN  Take 1 tablet (25 mg total) by mouth every 6 (six) hours as needed for nausea or vomiting.     tiZANidine 2 MG tablet  Commonly known as:  ZANAFLEX   Take 1 tablet (2 mg total) by mouth every 8 (eight) hours as needed for muscle spasms.     TYLENOL ARTHRITIS PAIN 650 MG CR tablet  Generic drug:  acetaminophen  Take 650 mg by mouth as needed for pain.        Discharge Assessment: Filed Vitals:   05/30/14 1435  BP: 131/82  Pulse: 99  Temp: 98 F (36.7 C)  Resp: 20   Skin clean, dry and intact without evidence of skin break down, no evidence of skin tears noted. IV catheter discontinued intact. Site without signs and symptoms of complications - no redness or edema noted at insertion site, patient denies c/o pain - only slight tenderness at site.  Dressing with slight pressure applied.  D/c Instructions-Education: Discharge instructions given to patient/family with verbalized understanding. D/c education completed with patient/family including follow up instructions, medication list, d/c activities limitations if indicated, with other d/c instructions as indicated by MD - patient able to verbalize understanding, all questions fully answered. Patient instructed to return to ED, call 911, or call MD for any changes in condition.  Patient escorted via [place], and D/C home via private auto.  Dayle Points, RN 05/30/2014 2:53 PM

## 2014-05-30 NOTE — Progress Notes (Signed)
Medicare Important Message given?  YES (If response is "NO", the following Medicare IM given date fields will be blank) Date Medicare IM given:   Medicare IM given by:  Gaynell Eggleton 

## 2014-05-30 NOTE — Discharge Summary (Signed)
Physician Discharge Summary  [name] YKD:[id number] DOB: 1933-06-01 DOA: 05/27/2014  PCP: Gwendolyn Grant, MD  Admit date: 05/27/2014 Discharge date: 05/30/2014  Time spent: 40 minutes  Recommendations for Outpatient Follow-up:  1. Follow-up with primary care physician within one week.  Discharge Diagnoses:  Principal Problem:   Nausea Active Problems:   Diverticulitis   Diarrhea   Discharge Condition: Stable  Diet recommendation: Heart healthy  Filed Weights   05/27/14 2100 05/28/14 0608 05/30/14 0556  Weight: 69.5 kg (153 lb 3.5 oz) 69.083 kg (152 lb 4.8 oz) 68.2 kg (150 lb 5.7 oz)    History of present illness:  80 yo female with COPD apparently has recent admission for diarrhea, diagnosed with diverticulitis and was discharged home on abx Sunday, had diarrhea on the admission day. Pt has nausea and can't take her cipro and flagyl due to nausea? Pt denies fever, chills, abd pain, constipation, brbpr, black stool. Pt will be admitted obs for diarrhea.   Hospital Course:    Acute diverticulitis Patient was discharged from the hospital on 12/6 to continue Cipro and Flagyl at home. Patient is complaining about nausea and continuing diarrhea. Patient came back to the hospital for further evaluation. C. difficile PCR is negative.  Continue same antibiotics of Cipro and Flagyl, initially was on IV and then switched to oral form. Placed on clear liquids initially, then advance to full liquids and regular diet. Patient tolerating regular diet since yesterday, will be discharged home to follow-up with primary care physician. Advised to take the Cipro and Flagyl she had at home from the previous prescription.  Anemia Patient has chronic anemia, stable, denies any bleeding.  Hyponatremia Presented with sodium of 132, likely secondary to dehydration.  This is resolved after hydration with IV fluids.  COPD Stable, patient is on home oxygen,  continue.   Procedures:  None  Consultations:  None  Discharge Exam: Filed Vitals:   05/30/14 0556  BP: 92/60  Pulse: 92  Temp: 97.5 F (36.4 C)  Resp: 18   General: Alert and awake, oriented x3, not in any acute distress. HEENT: anicteric sclera, pupils reactive to light and accommodation, EOMI CVS: S1-S2 clear, no murmur rubs or gallops Chest: clear to auscultation bilaterally, no wheezing, rales or rhonchi Abdomen: soft nontender, nondistended, normal bowel sounds, no organomegaly Extremities: no cyanosis, clubbing or edema noted bilaterally Neuro: Cranial nerves II-XII intact, no focal neurological deficits  Discharge Instructions You were cared for by a hospitalist during your hospital stay. If you have any questions about your discharge medications or the care you received while you were in the hospital after you are discharged, you can call the unit and asked to speak with the hospitalist on call if the hospitalist that took care of you is not available. Once you are discharged, your primary care physician will handle any further medical issues. Please note that NO REFILLS for any discharge medications will be authorized once you are discharged, as it is imperative that you return to your primary care physician (or establish a relationship with a primary care physician if you do not have one) for your aftercare needs so that they can reassess your need for medications and monitor your lab values.  Discharge Instructions    Diet - low sodium heart healthy    Complete by:  As directed      Increase activity slowly    Complete by:  As directed           Current  Discharge Medication List    CONTINUE these medications which have NOT CHANGED   Details  acetaminophen (TYLENOL ARTHRITIS PAIN) 650 MG CR tablet Take 650 mg by mouth as needed for pain.    ALPRAZolam (XANAX) 1 MG tablet Take 1 tablet (1 mg total) by mouth at bedtime. Qty: 30 tablet, Refills: 3     beclomethasone (QVAR) 80 MCG/ACT inhaler Inhale 2 puffs into the lungs 2 (two) times daily. Qty: 1 Inhaler, Refills: 3    ciprofloxacin (CIPRO) 500 MG tablet Take 1 tablet (500 mg total) by mouth 2 (two) times daily. Qty: 18 tablet, Refills: 0    levalbuterol (XOPENEX) 1.25 MG/3ML nebulizer solution Take 1.25 mg by nebulization every 8 (eight) hours as needed for wheezing or shortness of breath.    levothyroxine (SYNTHROID, LEVOTHROID) 100 MCG tablet Take 1 tablet (100 mcg total) by mouth daily before breakfast. Qty: 90 tablet, Refills: 1    lovastatin (MEVACOR) 40 MG tablet Take 1 tablet (40 mg total) by mouth at bedtime. Qty: 90 tablet, Refills: 3    metroNIDAZOLE (FLAGYL) 500 MG tablet Take 1 tablet (500 mg total) by mouth 3 (three) times daily. Qty: 27 tablet, Refills: 0    Multiple Vitamins-Minerals (CENTRUM SILVER PO) Take 1 tablet by mouth daily.     ondansetron (ZOFRAN) 4 MG tablet Take 1 tablet (4 mg total) by mouth every 6 (six) hours as needed for nausea. Qty: 20 tablet, Refills: 0    Respiratory Therapy Supplies (FLUTTER) DEVI Blow through 4 times per cycle and repeat 3 cycles per day Qty: 1 each, Refills: 0    aspirin EC 81 MG tablet Take 81 mg by mouth 2 (two) times a week.     benzonatate (TESSALON) 100 MG capsule Take 1 capsule (100 mg total) by mouth every 6 (six) hours as needed for cough. Qty: 30 capsule, Refills: 3    promethazine (PHENERGAN) 25 MG tablet Take 1 tablet (25 mg total) by mouth every 6 (six) hours as needed for nausea or vomiting. Qty: 30 tablet, Refills: 0    tiZANidine (ZANAFLEX) 2 MG tablet Take 1 tablet (2 mg total) by mouth every 8 (eight) hours as needed for muscle spasms. Qty: 30 tablet, Refills: 0       Allergies  Allergen Reactions  . Daliresp [Roflumilast] Nausea Only  . Tiotropium Bromide Monohydrate     Urinary retention  . Ampicillin     REACTION: GI upset  . Azithromycin     REACTION: diarrhea  . Codeine   .  Fludrocortisone Acetate Itching  . Milk-Related Compounds   . Nitrofurantoin     REACTION: Upset GI  . Penicillins   . Sulfonamide Derivatives    Follow-up Information    Follow up with Gwendolyn Grant, MD In 1 week.   Specialty:  Internal Medicine   Contact information:   [address]  [place] [address] [phone]        The results of significant diagnostics from this hospitalization (including imaging, microbiology, ancillary and laboratory) are listed below for reference.    Significant Diagnostic Studies: Ct Abdomen Pelvis W Contrast  05/24/2014   CLINICAL DATA:  Left lower quadrant pain, vomiting, diarrhea  EXAM: CT ABDOMEN AND PELVIS WITH CONTRAST  TECHNIQUE: Multidetector CT imaging of the abdomen and pelvis was performed using the standard protocol following bolus administration of intravenous contrast.  CONTRAST:  125mL OMNIPAQUE IOHEXOL 300 MG/ML  SOLN  COMPARISON:  12/30/2013  FINDINGS: Lower chest: Trace right pleural effusion with pleural-based calcifications, chronic. Emphysematous changes at the lung bases.  Hepatobiliary: Liver is notable for tiny probable cysts in the inferior right hepatic lobe (series 2/images 26 and 28), although technically indeterminate.  Gallbladder is unremarkable. No intrahepatic or extrahepatic ductal dilatation.  Pancreas: Within normal limits.  Spleen: Within normal limits.  Adrenals/Urinary Tract: Adrenal glands are unremarkable.  Kidneys are within normal limits.  Bladder is unremarkable.  Stomach/Bowel: Stomach is notable for a small hiatal hernia. Contrast in the distal esophagus, suggesting gastroesophageal reflux or esophageal dysmotility.  No evidence of bowel obstruction.  Normal appendix.  Colonic diverticulosis, with wall thickening/inflammatory changes involving the proximal sigmoid colon (series 2/ image 52), compatible with sigmoid diverticulitis.  No drainable fluid collection/abscess.  No free air.   Vascular/Lymphatic: Atherosclerotic calcifications of the abdominal aorta and branch vessels.  No suspicious abdominopelvic lymphadenopathy.  Reproductive: Status post hysterectomy.  No adnexal masses.  Other: No abdominopelvic ascites.  Musculoskeletal: Degenerative changes of the visualized thoracolumbar spine.  IMPRESSION: Sigmoid diverticulitis.  No drainable fluid collection/abscess.  No free air.   Electronically Signed   By: Julian Hy M.D.   On: 05/24/2014 00:04    Microbiology: Recent Results (from the past 240 hour(s))  Clostridium Difficile by PCR     Status: None   Collection Time: 05/27/14  6:19 PM  Result Value Ref Range Status   C difficile by pcr NEGATIVE NEGATIVE Final  Stool culture     Status: None (Preliminary result)   Collection Time: 05/28/14  1:20 AM  Result Value Ref Range Status   Specimen Description STOOL  Final   Special Requests NONE  Final   Culture   Final    NO SUSPICIOUS COLONIES, CONTINUING TO HOLD Note: REDUCED NORMAL FLORA PRESENT Performed at [facility]    Report Status PENDING  Incomplete     Labs: Basic Metabolic Panel:  Recent Labs Lab 05/23/14 2055 05/24/14 0543 05/27/14 1500 05/28/14 0500 05/29/14 0718  NA 134* 136* 132* 140 142  K 3.9 4.3 4.1 3.9 3.8  CL 93* 97 94* 105 106  CO2 28 29 26 23 28   GLUCOSE 118* 103* 96 86 87  BUN 8 8 3* <3* <3*  CREATININE 0.73 0.73 0.74 0.65 0.65  CALCIUM 10.0 9.4 9.4 8.8 9.1  MG  --  2.0  --   --   --   PHOS  --  3.1  --   --   --    Liver Function Tests:  Recent Labs Lab 05/23/14 2055 05/24/14 0543 05/27/14 1500 05/28/14 0500  AST 24 21 37 39*  ALT 15 14 20 19   ALKPHOS 93 84 74 62  BILITOT 0.4 0.6 0.4 0.3  PROT 7.7 6.9 6.9 5.9*  ALBUMIN 4.1 3.3* 3.5 2.9*    Recent Labs Lab 05/23/14 2055  LIPASE 32   No results for input(s): AMMONIA in the last 168 hours. CBC:  Recent Labs Lab 05/23/14 2055 05/24/14 0543 05/25/14 0741 05/27/14 1500 05/28/14 0500  05/29/14 0718  WBC 13.3* 11.4* 5.8 4.5 4.0 3.5*  NEUTROABS 10.8*  --   --  2.7 1.8  --   HGB 11.8* 11.0* 10.4* 10.8* 10.1* 10.9*  HCT 36.3 35.0* 33.2* 33.3* 31.3* 33.5*  MCV 92.1 93.3 94.1 91.7 91.8 94.1  PLT 209 192 173 218 202 206   Cardiac Enzymes: No results for input(s): CKTOTAL, CKMB, CKMBINDEX, TROPONINI in the last 168 hours. BNP: BNP (last 3 results)  No results for input(s): PROBNP in the last 8760 hours. CBG: No results for input(s): GLUCAP in the last 168 hours.     Signed:  Jenica Costilow A  [facility] 05/30/2014, 10:33 AM

## 2014-05-31 LAB — STOOL CULTURE

## 2014-06-02 ENCOUNTER — Telehealth: Payer: Self-pay | Admitting: Internal Medicine

## 2014-06-02 DIAGNOSIS — M7989 Other specified soft tissue disorders: Secondary | ICD-10-CM

## 2014-06-02 NOTE — Telephone Encounter (Signed)
We'll order venous Doppler to rule out DVT while awaiting follow-up with [facility] on 12/18 [facility] will call with appointment and results after review.  Until then, okay to remain off feet

## 2014-06-02 NOTE — Telephone Encounter (Signed)
Spoke with pt to make sure she knew there is an appt tomorrow at 11am.

## 2014-06-02 NOTE — Telephone Encounter (Signed)
Pt released from hospital 12/11 scheduled for post hosp 12/18 with Dr Linna Darner as PCP not available. Pt states her right leg is swollen above the ankle. Pt wants to know if she should use ice pack, or heating pad? More of her leg was swollen last pm., this am less swollen. Does she need to stay off feet?

## 2014-06-03 ENCOUNTER — Ambulatory Visit (HOSPITAL_COMMUNITY): Payer: Medicare Other | Attending: Cardiology | Admitting: Cardiology

## 2014-06-03 DIAGNOSIS — M7989 Other specified soft tissue disorders: Secondary | ICD-10-CM | POA: Insufficient documentation

## 2014-06-03 NOTE — Progress Notes (Signed)
Unilateral lower venous duplex performed

## 2014-06-06 ENCOUNTER — Encounter: Payer: Self-pay | Admitting: Internal Medicine

## 2014-06-06 ENCOUNTER — Ambulatory Visit (INDEPENDENT_AMBULATORY_CARE_PROVIDER_SITE_OTHER): Payer: Medicare Other | Admitting: Internal Medicine

## 2014-06-06 VITALS — BP 116/76 | HR 76 | Temp 98.3°F | Resp 14 | Wt 153.0 lb

## 2014-06-06 DIAGNOSIS — K5712 Diverticulitis of small intestine without perforation or abscess without bleeding: Secondary | ICD-10-CM

## 2014-06-06 DIAGNOSIS — J42 Unspecified chronic bronchitis: Secondary | ICD-10-CM

## 2014-06-06 NOTE — Progress Notes (Signed)
Pre visit review using our clinic review tool, if applicable. No additional management support is needed unless otherwise documented below in the visit note. 

## 2014-06-06 NOTE — Progress Notes (Signed)
   Subjective:    Patient ID: [name], female    DOB: 11/09/1932, 80 y.o.   MRN: [id number]  HPI The hospital records were reviewed; her most recent hospitalization was 12/8-12/11/15. She was treated with parenteral medicines for diverticulitis and discharged on oral medicines  At admission she did have frank diarrhea. C. difficile studies were negative  Her GFR was mildly reduced at 82; BUN was normal. She felt that she had been dehydrated.  On 12/5 her white count was mildly elevated at 11,400. At discharge white count was 3500. She did exhibit some anemia with hemoglobin 10.9 and hematocrit 33.5.    She has taken the Cipro and metronidazole; yesterday she had 2 loose, soft bowel movements without frank diarrhea. Today stool consistency has improved even more  The twinges of pain she been having in her abdomen have resolved over the last 48 hours. Her major symptom is residual weakness. She describes nausea taking the 2 antibiotics for the diverticulitis. She takes an antinausea pill prior to the medications   Review of Systems She denies fever, chills, or sweats  As noted she has no diarrhea. She denies any melena or rectal bleeding.      Objective:   Physical Exam General appearance :adequately nourished; in no distress.  Eyes: No conjunctival inflammation or scleral icterus is present. Ptosis bilaterally  Oral exam: Dental hygiene is good. Lips and gums are healthy appearing.There is no oropharyngeal erythema or exudate noted.   Heart:  Normal rate and regular rhythm. S1 and S2 normal without gallop, murmur, click, rub or other extra sounds     Lungs:Scattered low grade rhonchi present.No increased work of breathing.   Abdomen: bowel sounds normal, soft and non-tender without masses, organomegaly or hernias noted.  No guarding or rebound.  Vascular : all pulses equal but slightly decreased pedal pulses ; no bruits present.  Skin:Warm & dry.  Intact without  suspicious lesions or rashes ; no jaundice . Slight tenting. 3 mm SQ nodule in center of ecchymosis RLQ (S/P Lovenox injections)  Lymphatic: No lymphadenopathy is noted about the head, neck, axilla             Assessment & Plan:  See Current Assessment & Plan in Problem List under specific Diagnosis

## 2014-06-06 NOTE — Patient Instructions (Signed)
Please take a probiotic , Florastor OR Align, every day if the bowels are loose. This will replace the normal bacteria which  are necessary for formation of normal stool and processing of food.

## 2014-06-08 NOTE — Assessment & Plan Note (Signed)
I encouraged completion of dual therapy Probiotic prn for loose stool Warning signs discussed

## 2014-06-08 NOTE — Assessment & Plan Note (Signed)
As per Dr Keturah Barre

## 2014-06-26 ENCOUNTER — Telehealth: Payer: Self-pay | Admitting: Internal Medicine

## 2014-06-26 NOTE — Telephone Encounter (Signed)
-----   Message from Hendricks Limes, MD sent at 06/06/2014  5:18 AM EST ----- Please verify she is coming this afternoon; thanks. [employer]

## 2014-06-30 ENCOUNTER — Ambulatory Visit (INDEPENDENT_AMBULATORY_CARE_PROVIDER_SITE_OTHER)
Admission: RE | Admit: 2014-06-30 | Discharge: 2014-06-30 | Disposition: A | Discharge status: Home / Self Care | Payer: Medicare Other | Source: Ambulatory Visit | Attending: Internal Medicine | Admitting: Internal Medicine

## 2014-06-30 ENCOUNTER — Encounter: Payer: Self-pay | Admitting: Internal Medicine

## 2014-06-30 ENCOUNTER — Telehealth: Payer: Self-pay | Admitting: Internal Medicine

## 2014-06-30 ENCOUNTER — Ambulatory Visit (INDEPENDENT_AMBULATORY_CARE_PROVIDER_SITE_OTHER): Payer: Medicare Other | Admitting: Internal Medicine

## 2014-06-30 VITALS — BP 118/62 | HR 100 | Temp 98.0°F | Ht 69.0 in | Wt 150.0 lb

## 2014-06-30 DIAGNOSIS — J432 Centrilobular emphysema: Secondary | ICD-10-CM

## 2014-06-30 DIAGNOSIS — J189 Pneumonia, unspecified organism: Secondary | ICD-10-CM

## 2014-06-30 MED ORDER — PREDNISONE 10 MG PO TABS: ORAL_TABLET | ORAL | Status: DC

## 2014-06-30 MED ORDER — LEVOFLOXACIN 500 MG PO TABS: 500.0000 mg | ORAL_TABLET | Freq: Every day | ORAL | Status: DC

## 2014-06-30 NOTE — Telephone Encounter (Signed)
Xopenex Nebulizer medication refilled to [facility]. Pt aware. Nothing further needed.

## 2014-06-30 NOTE — Progress Notes (Signed)
Patient ID: [name], female    DOB: 1932/07/18, 81 y.o.   MRN: [id number]  HPI 816/12-81 year old female former smoker followed for history of right upper lobe cancer, COPD, allergic rhinitis  Here with husband Last here -August 05, 2010- note reviewed Increased  coughing 2 weeks, but struggling all summer. Better at beach. Here few days ago to see Dr Ronnald Ramp.   Had significantly productive cough- purulent, sometimes  with black spots. Uses nebulizer 2-3x/day, O2 2l/ [employer]. On Avelox now with Symbicort from Dr Ronnald Ramp. Mouth is getting sore. Still actively wheezing and short of breath.  CXR December 07, 2010- clear except old surgical changes.  She is not sure if she began this flare with cold or allergy. Low fever once or twice in past month. CT w/cm- 12/21/10- normal heart size coronary calcification. Prior right upper lobectomy. Stable right millimeter nodule at right minor fissure. No suspicious nodules. Centrilobular emphysema.  05/24/11- 81 year old female former smoker followed for history of right upper lobe cancer, COPD, allergic rhinitis. Here with husband Has had flu shot. She was feeling well until one week ago when she developed sore throat, temperature to 99.7, chills and malaise, cough productive of purulent blood-streaked sputum. Has home nebulizer being used only once daily with Xopenex. She denies swollen glands, fluid retention, GI upset or chest pain.  06/28/11- 81 year old female former smoker followed for history of right upper lobe cancer, COPD, allergic rhinitis. Here with husband Reports- wheezing, chest congestion, runny nose, cough with green/red mucus tinged with blood.  She got well after last visit and been sick again. Did have flu vaccine. Now 3 or 4 days of rhinorrhea, chills, productive cough with discolored sputum. 3 days ago had brief episode of pink in sputum with hard cough and. Cough and sputum production are less today. Occasional tussive soreness  at the xiphoid. Frequent heartburn. Doxycycline and seemed to help but is completed now. Does have home nebulizer.  Had chest x-ray January 7: *RADIOLOGY REPORT* 06/27/11- Clinical Data: 81 year old with hemoptysis.  CHEST - 2 VIEW  Comparison: Chest radiograph 05/24/2011  Findings: Chronic volume loss and postoperative changes in the  right hemithorax. The upper lungs are clear with increased  lucency. Findings suggest emphysematous changes. Stable linear  density at the right lung base is suggestive for scar. The heart  and mediastinum are stable. Slightly increased interstitial  densities in the right mid chest are similar to the prior  examination. No focal airspace disease.  IMPRESSION:  Stable chest radiograph findings. No acute changes.  Original Report Authenticated By: Markus Daft, M.D.   10/13/11- 81 year old female former smoker followed for history of right upper lobe cancer, COPD, allergic rhinitis. Here with husband Always has SOB, wheezing, cough, and congestion-has gotten worse due to pollen-eyes bothering her and runny nose(sneezing) Increased congestion in head and nose. She had had a pneumonia in December and then had fallen at home in January, bruising her face. Using nebulizer twice a day. Complains portable oxygen is too heavy and is thinking about [facility]  02/16/12- 81 year old female former smoker followed for history of right upper lobe NSCCa, COPD, allergic rhinitis. Here with husband Sore throat and tongue-? white spots started 2-3 days ago; last night had cough-productive. Recent cortisone shot by orthopedist. COPD assessment test (CAT) score 13/24 CXR 01/06/12-reviewed with her IMPRESSION:  Stable emphysematous change and chronic postsurgical change of the  right lung without definite acute cardiopulmonary disease.  Original Report Authenticated By:  Rachel Moulds, M.D.   10/15/12- 53year-old female former smoker followed for history of  right upper lobe NSCCa, COPD, allergic rhinitis.  FOLLOWS DEY:CXKGYJEHU wheezing today; ? pollen causing it. Did well through the winter. Using oxygen 2 L/[facility]. Productive cough after being outdoors a lot yesterday. Clear sputum, no fever.  03/04/13- 15year-old female former smoker followed for history of right upper lobe Squamous Cell, COPD, allergic rhinitis FOLLOWS FOR:  Increased SOB and coughing w/ yellowish mucus since 9/11 Trying to help husband who has NHL. She reports one-week increased cough, green sputum. We sent doxycycline-sputum turning yellow. No fever or GI upset. Nasal congestion without headache. 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 Postoperative changes at the right lung base.  2. No acute cardiopulmonary disease or evidence for recurrent  disease.  Original Report Authenticated By: San Morelle, M.D.  04/16/13- 87year-old female former smoker followed for history of right upper lobe NSCCa, COPD, allergic rhinitis Follows for- Pt states her problems are improving.  Pt c/o mild prod cough with yellowish sputum with activity. Less cough, some nasal congestion. Less dyspnea on exertion. 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 HR and BP were high. Did not go to ER.   patient describes cough, white sputum, blowing white from nose, headache, raw throat. Denies fever or myalgias except feels sore across front of chest from coughing. No GI  upset. CXR 12/31/12 IMPRESSION:  1. Postoperative changes at the right lung base.  2. No acute cardiopulmonary disease or evidence for recurrent  disease.  Original Report Authenticated By: San Morelle, M.D.  10/15/13- 30year-old female former smoker followed for history of right upper lobe NSCCa, COPD, allergic rhinitis, complicated by glaucoma   Family here    Husband has cancer FOLLOWS FOR:  Sinus and chest congestion with cough white mucus and sneezing x3 days Exacerbation cleared well with Avelox and that worried. Had been well until 3 days ago now-has sneezing, watery nose blamed on pollen. Damp weather increases cough and head congestion. Baseline is daily chronic productive cough with white sputum in the mornings. CXR 07/29/13 IMPRESSION:  Chronic volume loss and pleural thickening in the right hemithorax.  No acute chest findings.  Electronically Signed  By: Markus Daft M.D.  On: 07/26/2013 17:49  04/01/14- 50year-old female former smoker followed for history of right upper lobe NSCCa, COPD, allergic rhinitis, complicated by glaucoma    Husband here, has cancer ACUTE VISIT: cough-productive-pinkish/red in color; recently at ER, CP and upper back pain that went down her arm. SOB as well. D-dimer, CBC, troponin and BMET were unremarkable Pt describes chest congestion starting 4-5 days ago, then sore L scapula x 2 days, maybe low grade fever and chilly one day. Woke with pain leftr upper arm 2 days ago. Went to ER. Taking capsules of otc mucus thinner with red color- blames that for pink sputum and mild queasy. Some occasional wheeze. Tussive soreness R lower anterior costal margin. Achey all over "like flu". Has not had flu shot yet. Shoulder pain is  gone.  CXR 03/29/14 IMPRESSION:  Stable scarring on right with volume loss. Underlying emphysema. No  edema or consolidation. No appreciable change compared to prior  study.       06/30/2014 acute  ov/[name] re: cough/ fever/ brown  sputum  Chief Complaint  Patient presents with  . Acute Visit    Pt c/o increased cough for the past 2 days- prod with dark brown/red sputum.  She also c/o HA, body aches, chills and low grade temp.   acutely since 06/29/14 Usually use neb just once a day but increased to three times daily > comfortable at rest p neb  Has flutter valve  But not using   No obvious day to day or daytime variabilty or assoc   cp or chest tightness, subjective wheeze overt sinus or hb symptoms. No unusual exp hx or h/o childhood pna/ asthma or knowledge of premature birth.  Sleeping ok without nocturnal  or early am exacerbation  of respiratory  c/o's or need for noct saba. Also denies any obvious fluctuation of symptoms with weather or environmental changes or other aggravating or alleviating factors except as outlined above   Current Medications, Allergies, Complete Past Medical History, Past Surgical History, Family History, and Social History were reviewed in [employer] record.  ROS  The following are not active complaints unless bolded sore throat, dysphagia, dental problems, itching, sneezing,  nasal congestion or excess/ purulent secretions, ear ache,   fever, chills, sweats, unintended wt loss, pleuritic or exertional cp, hemoptysis,  orthopnea pnd or leg swelling, presyncope, palpitations, heartburn, abdominal pain, anorexia, nausea, vomiting, diarrhea  or change in bowel or urinary habits, change in stools or urine, dysuria,hematuria,  rash, arthralgias, visual complaints, headache, numbness weakness or ataxia or problems with walking or coordination,  change in mood/affect or memory.          Objective:   Physical Exam   Chronically ill wf mod hoarse   Wt Readings from Last 3 Encounters:  06/30/14 150 lb (68.04 kg)  06/06/14 153 lb (69.4 kg)  05/30/14 150 lb 5.7 oz (68.2 kg)    Vital signs reviewed   HEENT: nl dentition, turbinates, and orophanx. Nl external ear canals  without cough reflex   NECK :  without JVD/Nodes/TM/ nl carotid upstrokes bilaterally   LUNGS: no acc muscle use, insp and exp rhonchi bilaterally    CV:  RRR  no s3 or murmur or increase in P2, no edema   ABD:  soft and nontender with nl excursion in the supine position. No bruits or organomegaly, bowel sounds nl  MS:  warm without deformities, calf tenderness, cyanosis or clubbing  SKIN: warm and dry without lesions    NEURO:  alert, approp, no deficits    CXR PA and Lateral:   06/30/2014 :  1. Diffuse infiltrates with nodularity in right lung again noted. Findings most likely infectious including possible granulomatous infection . Associated small right pleural effusion.  2. Right upper lobectomy.

## 2014-06-30 NOTE — Telephone Encounter (Signed)
Patient requesting refill for [facility]val[facility]erol 2.5 [facility], please call patient back at [phone]

## 2014-06-30 NOTE — Patient Instructions (Signed)
xopenex can be used up to every 4 hours if needed   levaquin 500 mg daily  x 7 days  Prednisone 10 mg take  4 each am x 2 days,   2 each am x 2 days,  1 each am x 2 days and stop   Please remember to go to the x-ray department downstairs for your tests - we will call you with the results when they are available.  GERD (REFLUX)  is an extremely common cause of respiratory symptoms just like yours , many times with no obvious heartburn at all.    It can be treated with medication, but also with lifestyle changes including avoidance of late meals, excessive alcohol, smoking cessation, and avoid fatty foods, chocolate, peppermint, colas, red wine, and acidic juices such as orange juice.  NO MINT OR MENTHOL PRODUCTS SO NO COUGH DROPS  USE SUGARLESS CANDY INSTEAD (Jolley ranchers or Stover's or Life Savers) or even ice chips will also do - the key is to swallow to prevent all throat clearing. NO OIL BASED VITAMINS - use powdered substitutes.   For cough mucinex dm up  To 1200 mg every 12 hours and use the flutter valve as much as you can   Let us know if you are not doing better toward the end of the week

## 2014-07-01 ENCOUNTER — Encounter: Payer: Self-pay | Admitting: Internal Medicine

## 2014-07-01 DIAGNOSIS — J189 Pneumonia, unspecified organism: Secondary | ICD-10-CM | POA: Insufficient documentation

## 2014-07-01 NOTE — Assessment & Plan Note (Signed)
Mild flare in setting of bronchopna on R   rec Prednisone 10 mg take  4 each am x 2 days,   2 each am x 2 days,  1 each am x 2 days and stop mucinex dm/ flutter    Each maintenance medication was reviewed in detail including most importantly the difference between maintenance and as needed and under what circumstances the prns are to be used.  Please see instructions for details which were reviewed in writing and the patient given a copy.

## 2014-07-01 NOTE — Progress Notes (Signed)
Quick Note:  Spoke with pt and notified of results per Dr. Melvyn Novas. Pt verbalized understanding. She is scheduled for ct chest that CDY ordered for 07/11/14  Should she come in sooner? Keep CT Chest appt?  ______

## 2014-07-01 NOTE — Assessment & Plan Note (Signed)
Technically could also be consider HCAP   rec levaquin 500 x 7 days then return for cxr/ to er in meantime if worsens

## 2014-07-02 ENCOUNTER — Ambulatory Visit: Payer: Medicare Other | Admitting: Internal Medicine

## 2014-07-02 NOTE — Progress Notes (Signed)
Quick Note:  Spoke with the pt and notified of recs per MW  She verbalized understanding  ______

## 2014-07-10 ENCOUNTER — Ambulatory Visit (INDEPENDENT_AMBULATORY_CARE_PROVIDER_SITE_OTHER)
Admission: RE | Admit: 2014-07-10 | Discharge: 2014-07-10 | Disposition: A | Discharge status: Home / Self Care | Payer: Medicare Other | Source: Ambulatory Visit | Attending: Internal Medicine | Admitting: Internal Medicine

## 2014-07-10 DIAGNOSIS — R911 Solitary pulmonary nodule: Secondary | ICD-10-CM

## 2014-07-10 MED ORDER — IOHEXOL 300 MG/ML  SOLN
80.0000 mL | Freq: Once | INTRAMUSCULAR | Status: AC | PRN
  Administered 2014-07-10: 80 mL via INTRAVENOUS

## 2014-07-11 ENCOUNTER — Inpatient Hospital Stay: Admit: 2014-07-11 | Payer: Medicare Other

## 2014-07-14 ENCOUNTER — Other Ambulatory Visit: Payer: Self-pay | Admitting: Internal Medicine

## 2014-07-14 DIAGNOSIS — R911 Solitary pulmonary nodule: Secondary | ICD-10-CM

## 2014-07-23 ENCOUNTER — Other Ambulatory Visit: Payer: Self-pay

## 2014-07-23 DIAGNOSIS — Z1231 Encounter for screening mammogram for malignant neoplasm of breast: Secondary | ICD-10-CM

## 2014-07-26 IMAGING — CR DG CHEST 2V
3 series · 3 of 3 positions shown · non-contrast
Comparison: Two-view chest 01/06/2012.

CLINICAL DATA: History of lung cancer.

CHEST - 2 VIEW

[w chest pa]
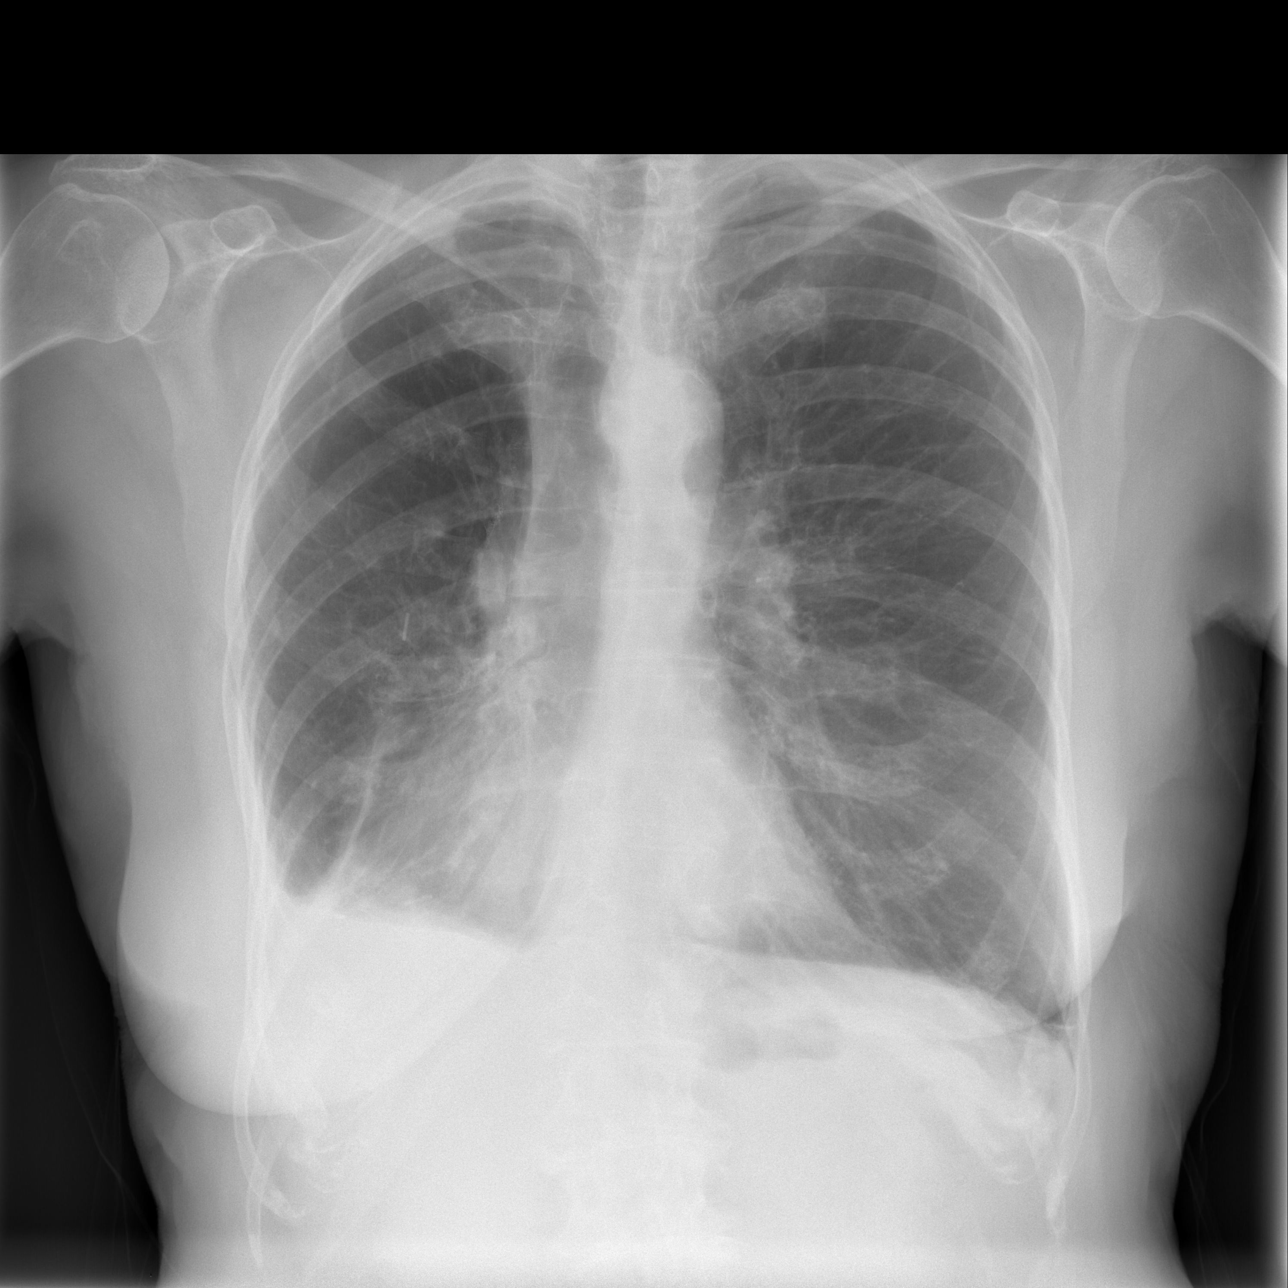

[w chest lat (1 of 2)]
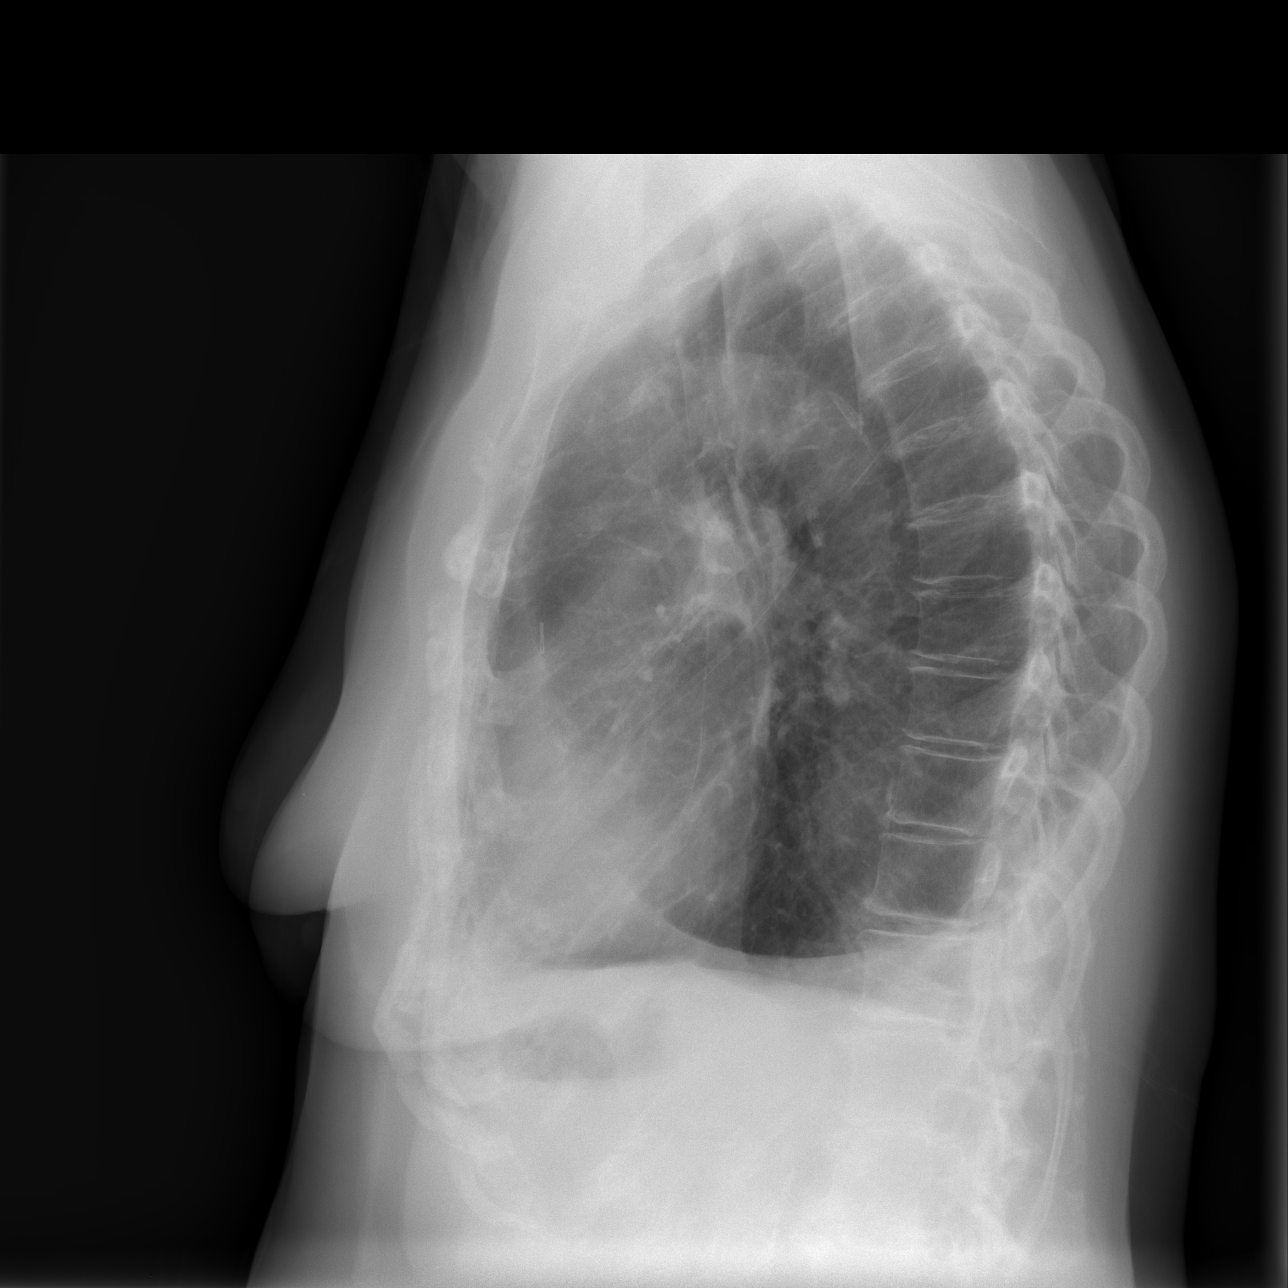

[w chest lat (2 of 2)]
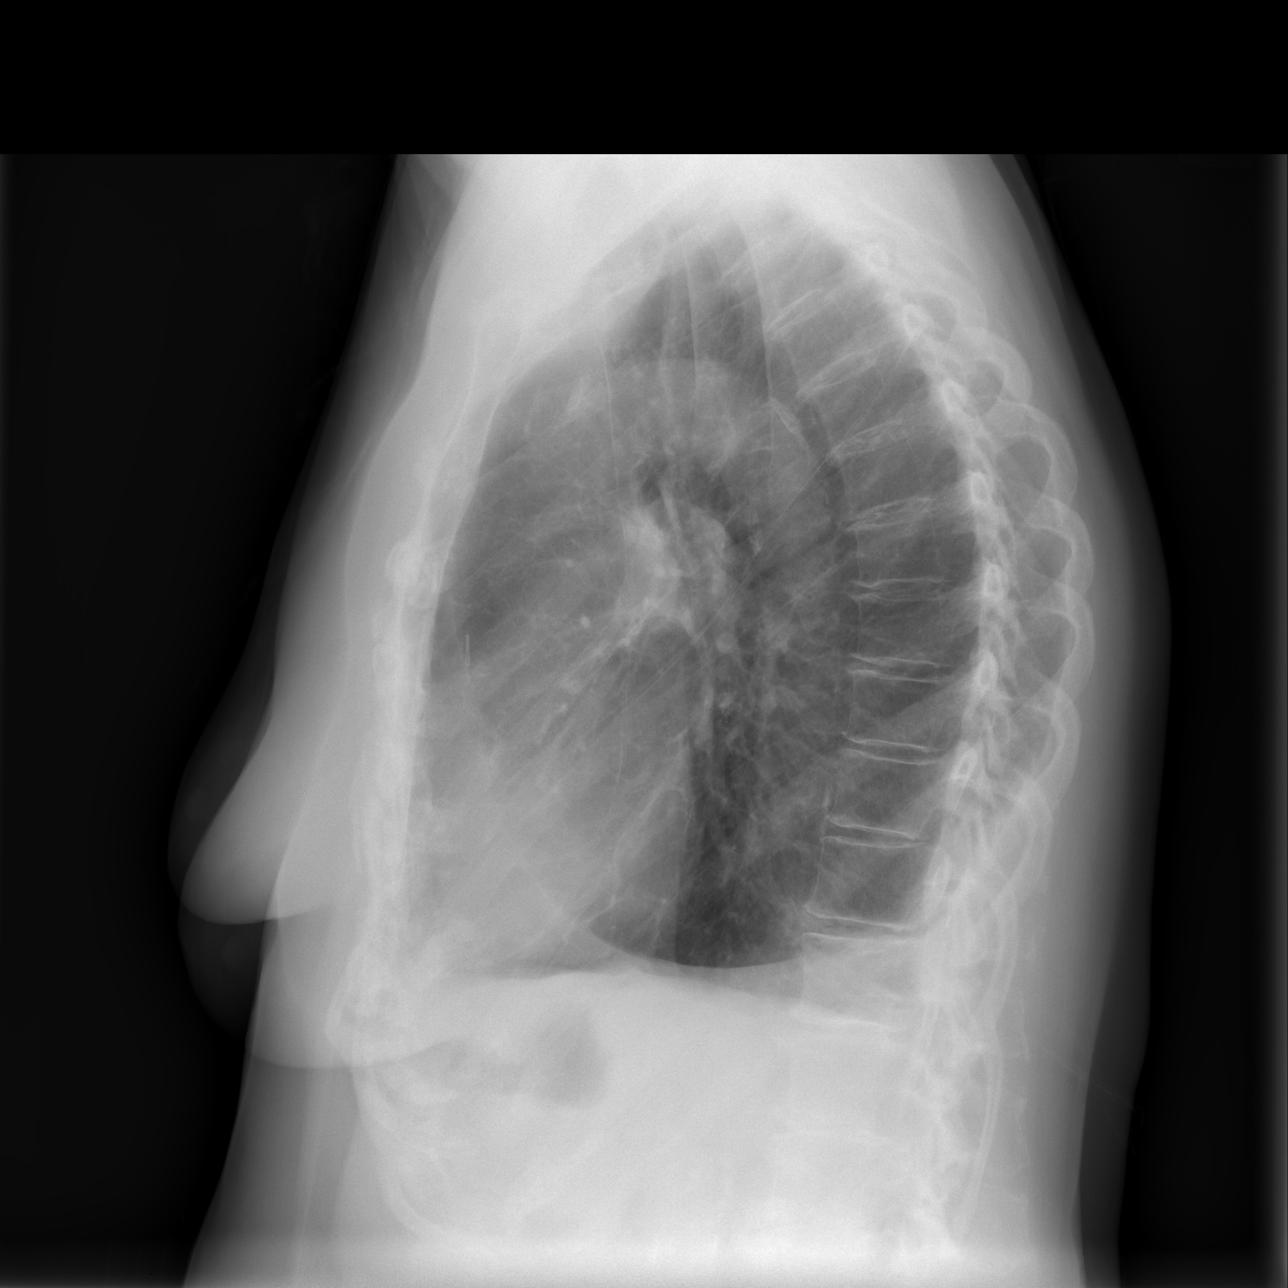

[3 of 3 positions shown; findings below may reference images not displayed]

FINDINGS: The heart size is normal.  Volume loss and scarring at
the right lung base is stable.  Emphysematous changes are again
noted.  No focal airspace disease evident.  There is no edema or
new effusion to suggest failure.
IMPRESSION: 1.  Postoperative changes at the right lung base.
2.  No acute cardiopulmonary disease or evidence for recurrent
disease.

## 2014-07-29 ENCOUNTER — Ambulatory Visit
Admission: RE | Admit: 2014-07-29 | Discharge: 2014-07-29 | Disposition: A | Discharge status: Home / Self Care | Payer: Medicare Other | Source: Ambulatory Visit

## 2014-07-29 DIAGNOSIS — Z1231 Encounter for screening mammogram for malignant neoplasm of breast: Secondary | ICD-10-CM

## 2014-08-23 ENCOUNTER — Other Ambulatory Visit: Payer: Self-pay | Admitting: Internal Medicine

## 2014-09-19 ENCOUNTER — Other Ambulatory Visit: Payer: Self-pay | Admitting: Internal Medicine

## 2014-09-19 NOTE — Telephone Encounter (Signed)
Called refill into pharmacy spoke with alex/pharmacist gave md approval.../lmb

## 2014-09-19 NOTE — Telephone Encounter (Signed)
Pt call requesting refill. Is this ok...[name]

## 2014-10-14 ENCOUNTER — Ambulatory Visit (INDEPENDENT_AMBULATORY_CARE_PROVIDER_SITE_OTHER)
Admission: RE | Admit: 2014-10-14 | Discharge: 2014-10-14 | Disposition: A | Discharge status: Home / Self Care | Payer: Medicare Other | Source: Ambulatory Visit | Attending: Internal Medicine | Admitting: Internal Medicine

## 2014-10-14 DIAGNOSIS — R911 Solitary pulmonary nodule: Secondary | ICD-10-CM

## 2014-10-16 ENCOUNTER — Telehealth: Payer: Self-pay | Admitting: Internal Medicine

## 2014-10-16 NOTE — Telephone Encounter (Signed)
Called and spoke to pt. Informed her of the results per CY. Pt verbalized understanding and denied any further questions or concerns at this time.    Notes Recorded by Deneise Lever, MD on 10/14/2014 at 8:27 PM CT- changes of emphysema and chronic bronchitis in the lungs, and atherosclerosis in the arteries, as before. Nothing new. The nodules have resolved or stabilized, which means they are probably benign.

## 2014-10-16 NOTE — Progress Notes (Signed)
Quick Note:  Called and spoke to pt. Informed her of the results per CY. Pt verbalized understanding and denied any further questions or concerns at this time. ______

## 2014-10-29 ENCOUNTER — Other Ambulatory Visit: Payer: Self-pay | Admitting: *Deleted

## 2014-10-29 ENCOUNTER — Other Ambulatory Visit: Payer: Self-pay | Admitting: Internal Medicine

## 2014-10-29 MED ORDER — LEVALBUTEROL HCL 1.25 MG/3ML IN NEBU: 1.2500 mg | INHALATION_SOLUTION | Freq: Three times a day (TID) | RESPIRATORY_TRACT | Status: DC | PRN

## 2014-10-31 ENCOUNTER — Telehealth: Payer: Self-pay | Admitting: Internal Medicine

## 2014-10-31 NOTE — Telephone Encounter (Signed)
Called patient to see why she was requesting Levaquin. She requested Levalbuterol 144 mL on 10/29/14 from her pharmacy. I told her this had already been submitted. She says the pharmacy told her they did not have a response. I called the pharmacy and it is ready for p/u. Pt aware nothing further needed.

## 2014-11-01 ENCOUNTER — Other Ambulatory Visit: Payer: Self-pay | Admitting: Internal Medicine

## 2014-11-04 ENCOUNTER — Other Ambulatory Visit (INDEPENDENT_AMBULATORY_CARE_PROVIDER_SITE_OTHER): Payer: Medicare Other

## 2014-11-04 ENCOUNTER — Telehealth: Payer: Self-pay | Admitting: Internal Medicine

## 2014-11-04 ENCOUNTER — Encounter: Payer: Self-pay | Admitting: Internal Medicine

## 2014-11-04 ENCOUNTER — Ambulatory Visit (INDEPENDENT_AMBULATORY_CARE_PROVIDER_SITE_OTHER): Payer: Medicare Other | Admitting: Internal Medicine

## 2014-11-04 VITALS — BP 132/58 | HR 82 | Temp 97.8°F | Ht 69.0 in | Wt 158.5 lb

## 2014-11-04 DIAGNOSIS — Z0000 Encounter for general adult medical examination without abnormal findings: Secondary | ICD-10-CM | POA: Diagnosis not present

## 2014-11-04 DIAGNOSIS — J432 Centrilobular emphysema: Secondary | ICD-10-CM

## 2014-11-04 DIAGNOSIS — E039 Hypothyroidism, unspecified: Secondary | ICD-10-CM

## 2014-11-04 DIAGNOSIS — R300 Dysuria: Secondary | ICD-10-CM | POA: Diagnosis not present

## 2014-11-04 DIAGNOSIS — E785 Hyperlipidemia, unspecified: Secondary | ICD-10-CM | POA: Diagnosis not present

## 2014-11-04 DIAGNOSIS — Z23 Encounter for immunization: Secondary | ICD-10-CM | POA: Diagnosis not present

## 2014-11-04 LAB — URINALYSIS, ROUTINE W REFLEX MICROSCOPIC
Bilirubin Urine: NEGATIVE
Hgb urine dipstick: NEGATIVE
Ketones, ur: NEGATIVE
Nitrite: NEGATIVE
PH: 7 (ref 5.0–8.0)
RBC / HPF: NONE SEEN (ref 0–?)
Total Protein, Urine: NEGATIVE
URINE GLUCOSE: NEGATIVE
Urobilinogen, UA: 0.2 (ref 0.0–1.0)

## 2014-11-04 LAB — BASIC METABOLIC PANEL
BUN: 6 mg/dL (ref 6–23)
CO2: 33 meq/L — AB (ref 19–32)
Calcium: 10 mg/dL (ref 8.4–10.5)
Chloride: 96 mEq/L (ref 96–112)
Creatinine, Ser: 0.77 mg/dL (ref 0.40–1.20)
GFR: 76.75 mL/min (ref >60.00)
GLUCOSE: 106 mg/dL — AB (ref 70–99)
POTASSIUM: 3.9 meq/L (ref 3.5–5.1)
SODIUM: 134 meq/L — AB (ref 135–145)

## 2014-11-04 LAB — TSH: TSH: 1.02 u[IU]/mL (ref 0.35–4.50)

## 2014-11-04 MED ORDER — ALPRAZOLAM 1 MG PO TABS: 1.0000 mg | ORAL_TABLET | Freq: Every day | ORAL | Status: DC

## 2014-11-04 MED ORDER — ATORVASTATIN CALCIUM 40 MG PO TABS: 40.0000 mg | ORAL_TABLET | Freq: Every day | ORAL | Status: DC

## 2014-11-04 MED ORDER — PROMETHAZINE HCL 25 MG PO TABS: 25.0000 mg | ORAL_TABLET | Freq: Four times a day (QID) | ORAL | Status: DC | PRN

## 2014-11-04 MED ORDER — LEVOTHYROXINE SODIUM 100 MCG PO TABS: 100.0000 ug | ORAL_TABLET | Freq: Every day | ORAL | Status: DC

## 2014-11-04 NOTE — Progress Notes (Signed)
Subjective:    Patient ID: [name], female    DOB: [birth year], 81 y.o.   MRN: [id number]  HPI   Here for medicare wellness  Diet: heart healthy Physical activity: sedentary Depression/mood screen: negative Hearing: intact to whispered voice Visual acuity: grossly normal, performs annual eye exam  ADLs: capable Fall risk: none Home safety: good Cognitive evaluation: intact to orientation, naming, recall and repetition EOL planning: adv directives, full code/ I agree  I have personally reviewed and have noted 1. The patient's medical and social history 2. Their use of alcohol, tobacco or illicit drugs 3. Their current medications and supplements 4. The patient's functional ability including ADL's, fall risks, home safety risks and hearing or visual impairment. 5. Diet and physical activities 6. Evidence for depression or mood disorders  Also reviewed chronic conditions, interval events and current concerns Recent breathing difficulty with COPD exacerbation reviewed  Past Medical History  Diagnosis Date  . Allergic rhinitis   . COPD (chronic obstructive pulmonary disease)   . Pelvic cyst   . Bladder atony   . Unspecified hypothyroidism   . Hyperlipidemia   . Non-small cell carcinoma of lung, stage 1 2005    1.4 cm Poorly differentiated Squamous cell RUL  T1N0 resected 09/22/2003  . Arthritis   . Asthma   . Emphysema of lung   . Diarrhea    Family History  Problem Relation Age of Onset  . Heart disease Father   . Rheumatologic disease Sister   . Rheumatologic disease Brother   . Cancer Brother     Leukemia   History  Substance Use Topics  . Smoking status: Former Research scientist (life sciences)  . Smokeless tobacco: Not on file     Comment: Quit 2001, Smoked x 42 yrs - 1 PPD  . Alcohol Use: No    Review of Systems  Constitutional: Positive for fatigue. Negative for unexpected weight change.  Respiratory: Positive for cough and shortness of breath. Negative for wheezing.    Cardiovascular: Negative for chest pain, palpitations and leg swelling.  Gastrointestinal: Negative for nausea, abdominal pain and diarrhea.  Genitourinary: Positive for dysuria and frequency. Negative for difficulty urinating.  Neurological: Negative for dizziness, weakness, light-headedness and headaches.  Psychiatric/Behavioral: Negative for dysphoric mood. The patient is not nervous/anxious.   All other systems reviewed and are negative.      Objective:    Physical Exam  Constitutional: She appears well-developed and well-nourished. No distress.  Cardiovascular: Normal rate, regular rhythm and normal heart sounds.   No murmur heard. Pulmonary/Chest: Effort normal and breath sounds normal. No respiratory distress.  Musculoskeletal: She exhibits no edema.    BP 132/58 mmHg  Pulse 82  Temp(Src) 97.8 F (36.6 C) (Oral)  Ht '5\' 9"'$  (1.753 m)  Wt 158 lb 8 oz (71.895 kg)  BMI 23.40 kg/m2  SpO2 96% Wt Readings from Last 3 Encounters:  11/04/14 158 lb 8 oz (71.895 kg)  06/30/14 150 lb (68.04 kg)  06/06/14 153 lb (69.4 kg)     Lab Results  Component Value Date   WBC 3.5* 05/29/2014   HGB 10.9* 05/29/2014   HCT 33.5* 05/29/2014   PLT 206 05/29/2014   GLUCOSE 87 05/29/2014   CHOL 240* 04/30/2014   TRIG 150.0* 04/30/2014   HDL 62.30 04/30/2014   LDLDIRECT 152.8 04/10/2013   LDLCALC 148* 04/30/2014   ALT 19 05/28/2014   AST 39* 05/28/2014   NA 142 05/29/2014   K 3.8 05/29/2014   CL 106 05/29/2014  CREATININE 0.65 05/29/2014   BUN <3* 05/29/2014   CO2 28 05/29/2014   TSH 0.571 05/24/2014   INR 0.94 02/05/2010   HGBA1C 5.2 02/22/2012    Ct Chest Wo Contrast  10/14/2014   CLINICAL DATA:  81 year old female with history of pulmonary nodules noted on prior CT examination. Followup study. History of lung cancer diagnosed in 2006 status post right upper lobectomy. Prior history of smoking.  EXAM: CT CHEST WITHOUT CONTRAST  TECHNIQUE: Multidetector CT imaging of the chest  was performed following the standard protocol without IV contrast.  COMPARISON:  Chest CT 07/10/2014.  FINDINGS: Mediastinum/Lymph Nodes: Heart size is normal. There is no significant pericardial fluid, thickening or pericardial calcification. There is atherosclerosis of the thoracic aorta, the great vessels of the mediastinum and the coronary arteries, including calcified atherosclerotic plaque in the left main, left anterior descending, left circumflex and right coronary arteries. No pathologically enlarged mediastinal or hilar lymph nodes. Please note that accurate exclusion of hilar adenopathy is limited on noncontrast CT scans. Esophagus is unremarkable in appearance. No axillary lymphadenopathy.  Lungs/Pleura: Status post right upper lobectomy. Compensatory hyperexpansion of the right lower and right middle lobes. Some nodularity is again noted along the right major fissure, most notable for a partially calcified 12 x 7 mm nodule (image 24 of series 3), which is unchanged, and considered benign. The patchy nodularity noted in the periphery of the right lower lobe has resolved, as has the associated septal thickening, indicative of an infectious or inflammatory process on the prior study. No new suspicious appearing pulmonary nodules or masses are otherwise noted on today's examination. Diffuse bronchial wall thickening with moderate to severe centrilobular emphysema redemonstrated. Chronic right-sided pleural thickening and calcification is unchanged. No calcified pleural plaques in the left hemithorax.  Upper Abdomen: Tiny calcified granuloma the liver. Sub cm low-attenuation lesion in segment 4A of the liver anteriorly is incompletely characterized on today's noncontrast CT examination, but is similar to prior studies, presumably a tiny cyst. Atherosclerosis.  Musculoskeletal/Soft Tissues: There are no aggressive appearing lytic or blastic lesions noted in the visualized portions of the skeleton.   IMPRESSION: 1. Previously noted nodularity in the right lung has resolved compared to the prior examination, indicative of an infectious or inflammatory process on the prior study. No new suspicious appearing pulmonary nodules or masses are identified on today's examination. All other previously noted subpleural nodularity in the right lung is unchanged, and considered benign. 2. Status post right upper lobectomy with some stable postoperative pleuroparenchymal scarring in the right hemithorax. 3. Atherosclerosis, including left main and 3 vessel coronary artery disease. Assessment for potential risk factor modification, dietary therapy or pharmacologic therapy may be warranted, if clinically indicated. 4. Diffuse bronchial wall thickening with moderate to severe centrilobular emphysema redemonstrated. 5. Additional incidental findings, as above.   Electronically Signed   By: Vinnie Langton M.D.   On: 10/14/2014 15:28       Assessment & Plan:   AWV/z00.00 - Today patient counseled on age appropriate routine health concerns for screening and prevention, each reviewed and up to date or declined. Immunizations reviewed and up to date or declined. Labs ordered and reviewed. Risk factors for depression reviewed and negative. Hearing function and visual acuity are intact. ADLs screened and addressed as needed. Functional ability and level of safety reviewed and appropriate. Education, counseling and referrals performed based on assessed risks today. Patient provided with a copy of personalized plan for preventive services.  Dysuria - check UA -  chronic symptoms with OAB and prolapse from COPD/coughing  Problem List Items Addressed This Visit    COPD (chronic obstructive pulmonary disease)    2LPM [place] O2 qhs and prn - Follows with pulm for same-  Exacerbation with PNA spring 2016 reviewed Noted CT chest 03/2014 changes - follow up 10/14/14 unchanged (to monitor changes given hx SCC hx with RULobectomy) - pt  aware of same      Relevant Orders   Basic metabolic panel   Dyslipidemia (Chronic)    Prev on Lipitor - stopped due to myalgia - then increase lipids prompted resume of med tx - crestor caused muscle fatigue so decrease dose (protected by high HDL) resumed generic statin 10/2011 due to $$ issues - prava, then lova -takes intermittently Wishes to resume atorva now (10/2014) - erx done Check annually, titrate as needed to goal and as tolerated      Relevant Medications   atorvastatin (LIPITOR) 40 MG tablet   Hypothyroidism    Lab Results  Component Value Date   TSH 0.571 05/24/2014  check TSH annually and prn The current medical regimen is effective;  continue present plan and medications.       Relevant Medications   levothyroxine (SYNTHROID, LEVOTHROID) 100 MCG tablet   Other Relevant Orders   TSH    Other Visit Diagnoses    Routine general medical examination at a health care facility    -  Primary    Dysuria        Relevant Orders    Urinalysis, Routine w reflex microscopic        Gwendolyn Grant, MD

## 2014-11-04 NOTE — Assessment & Plan Note (Signed)
Prev on Lipitor - stopped due to myalgia - then increase lipids prompted resume of med tx - crestor caused muscle fatigue so decrease dose (protected by high HDL) resumed generic statin 10/2011 due to $$ issues - prava, then lova -takes intermittently Wishes to resume atorva now (10/2014) - erx done Check annually, titrate as needed to goal and as tolerated

## 2014-11-04 NOTE — Assessment & Plan Note (Signed)
Lab Results  Component Value Date   TSH 0.571 05/24/2014  check TSH annually and prn The current medical regimen is effective;  continue present plan and medications.

## 2014-11-04 NOTE — Telephone Encounter (Signed)
erx done

## 2014-11-04 NOTE — Telephone Encounter (Signed)
Patient requesting refill of promethazine (PHENERGAN) 25 MG tablet [[id number]] sent to [facility] on [address]

## 2014-11-04 NOTE — Patient Instructions (Signed)
It was good to see you today.  We have reviewed your prior records including labs and tests today  Health Maintenance reviewed - Prevnar updated today - all other recommended immunizations and age-appropriate screenings are up-to-date.  Test(s) ordered today. Your results will be released to [place] (or called to you) after review, usually within 72hours after test completion. If any changes need to be made, you will be notified at that same time.  Medications reviewed and updated Change lovastatin to atorvastatin - no other changes recommended at this time.  Please schedule followup in 6 months for semiannual exam and labs, call sooner if problems.

## 2014-11-04 NOTE — Progress Notes (Signed)
Pre visit review using our clinic review tool, if applicable. No additional management support is needed unless otherwise documented below in the visit note. 

## 2014-11-04 NOTE — Assessment & Plan Note (Signed)
2LPM [place] O2 qhs and prn - Follows with pulm for same-  Exacerbation with PNA spring 2016 reviewed Noted CT chest 03/2014 changes - follow up 10/14/14 unchanged (to monitor changes given hx SCC hx with RULobectomy) - pt aware of same

## 2014-11-11 ENCOUNTER — Telehealth: Payer: Self-pay | Admitting: Internal Medicine

## 2014-11-11 NOTE — Telephone Encounter (Signed)
Patient stated that she need some medication called into her her pharmacy, the she has a sore spot in the roof of her mouth, please advise

## 2014-11-12 MED ORDER — NYSTATIN 100000 UNIT/ML MT SUSP: 5.0000 mL | Freq: Four times a day (QID) | OROMUCOSAL | Status: DC

## 2014-11-12 NOTE — Telephone Encounter (Signed)
Notified pt with md response.../lmb 

## 2014-11-12 NOTE — Telephone Encounter (Signed)
Nystatin solution 4x/day, erx done -  if not improved, will need OV with any provider

## 2014-11-14 ENCOUNTER — Telehealth: Payer: Self-pay | Admitting: Internal Medicine

## 2014-11-14 NOTE — Telephone Encounter (Signed)
Please call patient regarding her mouth rinse nystatin. Her sores are not clearing up yet and she is wondering, since the instructions are not clear, is she suppose to swallow the rinse or just spit it out?

## 2014-11-14 NOTE — Telephone Encounter (Signed)
Spoke to pt and informed that she could spit or swallow.

## 2014-11-19 NOTE — Telephone Encounter (Signed)
Patient called regarding

## 2014-11-19 NOTE — Telephone Encounter (Signed)
Patient called regarding the sore on the top of her mouth. She has used most of the mouthwash and the sore is still there. She is wondering what we need to do next and if she should go see a different kind of doctor. Please advise patient.

## 2014-11-20 NOTE — Telephone Encounter (Signed)
Pt contacted and OV scheduled.

## 2014-11-24 ENCOUNTER — Encounter: Payer: Self-pay | Admitting: Family

## 2014-11-24 ENCOUNTER — Ambulatory Visit (INDEPENDENT_AMBULATORY_CARE_PROVIDER_SITE_OTHER): Payer: Medicare Other | Admitting: Family

## 2014-11-24 VITALS — BP 120/68 | HR 78 | Temp 97.8°F | Resp 18 | Ht 69.0 in | Wt 151.8 lb

## 2014-11-24 DIAGNOSIS — K121 Other forms of stomatitis: Secondary | ICD-10-CM | POA: Insufficient documentation

## 2014-11-24 MED ORDER — TRIAMCINOLONE ACETONIDE 0.1 % MT PSTE: 1.0000 "application " | PASTE | Freq: Two times a day (BID) | OROMUCOSAL | Status: DC

## 2014-11-24 NOTE — Patient Instructions (Signed)
Thank you for choosing [facility].  Summary/Instructions:  Your prescription(s) have been submitted to your pharmacy or been printed and provided for you. Please take as directed and contact our office if you believe you are having problem(s) with the medication(s) or have any questions.  If your symptoms worsen or fail to improve, please contact our office for further instruction, or in case of emergency go directly to the emergency room at the closest medical facility.    Oral Ulcers Oral ulcers are painful, shallow sores around the lining of the mouth. They can affect the gums, the inside of the lips, and the cheeks. (Sores on the outside of the lips and on the face are different.) They typically first occur in school-aged children and teenagers. Oral ulcers may also be called canker sores or cold sores. CAUSES  Canker sores and cold sores can be caused by many factors including:  Infection.  Injury.  Sun exposure.  Medications.  Emotional stress.  Food allergies.  Vitamin deficiencies.  Toothpastes containing sodium lauryl sulfate. The herpes virus can be the cause of mouth ulcers. The first infection can be severe and cause 10 or more ulcers on the gums, tongue, and lips with fever and difficulty in swallowing. This infection usually occurs between the ages of 109 and 3 years.  SYMPTOMS  The typical sore is about  inch (6 mm) in size and is an oval or round ulcer with red borders. DIAGNOSIS  Your caregiver can diagnose simple oral ulcers by examination. Additional testing is usually not required.  TREATMENT  Treatment is aimed at pain relief. Generally, oral ulcers resolve by themselves within 1 to 2 weeks without medication and are not contagious unless caused by herpes (and other viruses). Antibiotics are not effective with mouth sores. Avoid direct contact with others until the ulcer is completely healed. See your caregiver for follow-up care as recommended.  Also:  Offer a soft diet.  Encourage plenty of fluids to prevent dehydration. Popsicles and milk shakes can be helpful.  Avoid acidic and salty foods and drinks such as orange juice.  Infants and young children will often refuse to drink because of pain. Using a teaspoon, cup, or syringe to give small amounts of fluids frequently can help prevent dehydration.  Cold compresses on the face may help reduce pain.  Pain medication can help control soreness.  A solution of diphenhydramine mixed with a liquid antacid can be useful to decrease the soreness of ulcers. Consult a caregiver for the dosing.  Liquids or ointments with a numbing ingredient may be helpful when used as recommended.  Older children and teenagers can rinse their mouth with a salt-water mixture (1/2 teaspoon of salt in 8 ounces of water) four times a day. This treatment is uncomfortable but may reduce the time the ulcers are present.  There are many over-the-counter throat lozenges and medications available for oral ulcers. Their effectiveness has not been studied.  Consult your medical caregiver prior to using homeopathic treatments for oral ulcers. SEEK MEDICAL CARE IF:   You think your child needs to be seen.  The pain worsens and you cannot control it.  There are 4 or more ulcers.  The lips and gums begin to bleed and crust.  A single mouth ulcer is near a tooth that is causing a toothache or pain.  Your child has a fever, swollen face, or swollen glands.  The ulcers began after starting a medication.  Mouth ulcers keep reoccurring or last more  than 2 weeks.  You think your child is not taking adequate fluids. SEEK IMMEDIATE MEDICAL CARE IF:   Your child has a high fever.  Your child is unable to swallow or becomes dehydrated.  Your child looks or acts very ill.  An ulcer caused by a chemical your child accidentally put in their mouth. Document Released: 07/14/2004 Document Revised: 10/21/2013  Document Reviewed: 02/26/2009 [facility] Patient Information 2015 [place], [place]. This information is not intended to replace advice given to you by your health care provider. Make sure you discuss any questions you have with your health care provider.

## 2014-11-24 NOTE — Progress Notes (Signed)
Subjective:    Patient ID: [name], female    DOB: 10-12-1932, 81 y.o.   MRN: [id number]  Chief Complaint  Patient presents with  . Mouth Lesions    was seen a few weeks ago due to sores her mouth, has used all of the mouth wash that was prescribed but still feels a sore in her mouth    HPI:  [name] is a 81 y.o. female with a PMH of dyslipidemia, hypothyroidism, COPD, non-small cell carcinoma of the lung, anxiety and diverticulitis who presents today for a follow up office visit.  Associated symptom of a sore located in her mouth has been going on for about a month which has been refractory to the modifying factors of hydrogen peroxide and nystatin rinses. There has been some improvement since treatment started. Does have some pain with eating that has been also improved.    Allergies  Allergen Reactions  . Daliresp [Roflumilast] Nausea Only  . Tiotropium Bromide Monohydrate     Urinary retention  . Ampicillin     REACTION: GI upset  . Azithromycin     REACTION: diarrhea  . Codeine   . Fludrocortisone Acetate Itching  . Milk-Related Compounds   . Nitrofurantoin     REACTION: Upset GI  . Penicillins   . Sulfonamide Derivatives     Current Outpatient Prescriptions on File Prior to Visit  Medication Sig Dispense Refill  . acetaminophen (TYLENOL ARTHRITIS PAIN) 650 MG CR tablet Take 650 mg by mouth as needed for pain.    [name] ALPRAZolam (XANAX) 1 MG tablet Take 1 tablet (1 mg total) by mouth at bedtime. 30 tablet 5  . aspirin EC 81 MG tablet Take 81 mg by mouth 2 (two) times a week.     [name] atorvastatin (LIPITOR) 40 MG tablet Take 1 tablet (40 mg total) by mouth daily. 90 tablet 3  . beclomethasone (QVAR) 80 MCG/ACT inhaler Inhale 2 puffs into the lungs 2 (two) times daily. 8.7 g 5  . levalbuterol (XOPENEX) 1.25 MG/3ML nebulizer solution Take 1.25 mg by nebulization every 8 (eight) hours as needed for wheezing or shortness of breath. 72 mL 2  . levothyroxine  (SYNTHROID, LEVOTHROID) 100 MCG tablet Take 1 tablet (100 mcg total) by mouth daily before breakfast. 90 tablet 1  . Multiple Vitamins-Minerals (CENTRUM SILVER PO) Take 1 tablet by mouth daily.     . promethazine (PHENERGAN) 25 MG tablet Take 1 tablet (25 mg total) by mouth every 6 (six) hours as needed for nausea or vomiting. 30 tablet 0  . Respiratory Therapy Supplies (FLUTTER) DEVI Blow through 4 times per cycle and repeat 3 cycles per day 1 each 0   No current facility-administered medications on file prior to visit.     Review of Systems  Constitutional: Negative for fever and chills.  HENT: Positive for mouth sores.       Objective:    BP 120/68 mmHg  Pulse 78  Temp(Src) 97.8 F (36.6 C) (Oral)  Resp 18  Ht '5\' 9"'$  (1.753 m)  Wt 151 lb 12.8 oz (68.856 kg)  BMI 22.41 kg/m2  SpO2 95% Nursing note and vital signs reviewed.  Physical Exam  Constitutional: She is oriented to person, place, and time. She appears well-developed and well-nourished. No distress.  HENT:  Approximately 1-2 mm oral ulcer with mild redness noted in the center of the hard palate.   Cardiovascular: Normal rate, regular rhythm, normal heart sounds and intact distal pulses.  Pulmonary/Chest: Effort normal and breath sounds normal.  Neurological: She is alert and oriented to person, place, and time.  Skin: Skin is warm and dry.  Psychiatric: She has a normal mood and affect. Her behavior is normal. Judgment and thought content normal.       Assessment & Plan:    Problem List Items Addressed This Visit      Digestive   Oral ulcer - Primary    Oral ulcer of hard palate refractory to nystatin rinse. Start Kenalog mouth paste and OTC Orabase as needed. Appears to be healing slowly. Follow up if symptoms worsen or fail to improve.       Relevant Medications   triamcinolone (KENALOG) 0.1 % paste

## 2014-11-24 NOTE — Assessment & Plan Note (Signed)
Oral ulcer of hard palate refractory to nystatin rinse. Start Kenalog mouth paste and OTC Orabase as needed. Appears to be healing slowly. Follow up if symptoms worsen or fail to improve.

## 2014-11-24 NOTE — Progress Notes (Signed)
Pre visit review using our clinic review tool, if applicable. No additional management support is needed unless otherwise documented below in the visit note. 

## 2014-12-22 ENCOUNTER — Encounter (HOSPITAL_COMMUNITY): Payer: Self-pay | Admitting: *Deleted

## 2014-12-22 ENCOUNTER — Inpatient Hospital Stay (HOSPITAL_COMMUNITY)
Admission: EM | Admit: 2014-12-22 | Discharge: 2014-12-25 | DRG: 195 | Disposition: A | Discharge status: Home / Self Care | Payer: Medicare Other | Attending: Internal Medicine | Admitting: Internal Medicine

## 2014-12-22 ENCOUNTER — Emergency Department (HOSPITAL_COMMUNITY): Payer: Medicare Other

## 2014-12-22 DIAGNOSIS — Z9849 Cataract extraction status, unspecified eye: Secondary | ICD-10-CM

## 2014-12-22 DIAGNOSIS — R05 Cough: Secondary | ICD-10-CM

## 2014-12-22 DIAGNOSIS — Z91018 Allergy to other foods: Secondary | ICD-10-CM

## 2014-12-22 DIAGNOSIS — J45909 Unspecified asthma, uncomplicated: Secondary | ICD-10-CM | POA: Diagnosis present

## 2014-12-22 DIAGNOSIS — Z880 Allergy status to penicillin: Secondary | ICD-10-CM

## 2014-12-22 DIAGNOSIS — Z85118 Personal history of other malignant neoplasm of bronchus and lung: Secondary | ICD-10-CM

## 2014-12-22 DIAGNOSIS — Z79899 Other long term (current) drug therapy: Secondary | ICD-10-CM

## 2014-12-22 DIAGNOSIS — I959 Hypotension, unspecified: Secondary | ICD-10-CM | POA: Diagnosis present

## 2014-12-22 DIAGNOSIS — Z87891 Personal history of nicotine dependence: Secondary | ICD-10-CM

## 2014-12-22 DIAGNOSIS — R059 Cough, unspecified: Secondary | ICD-10-CM

## 2014-12-22 DIAGNOSIS — J189 Pneumonia, unspecified organism: Secondary | ICD-10-CM | POA: Diagnosis not present

## 2014-12-22 DIAGNOSIS — Z90710 Acquired absence of both cervix and uterus: Secondary | ICD-10-CM | POA: Diagnosis not present

## 2014-12-22 DIAGNOSIS — Z7982 Long term (current) use of aspirin: Secondary | ICD-10-CM

## 2014-12-22 DIAGNOSIS — Z888 Allergy status to other drugs, medicaments and biological substances status: Secondary | ICD-10-CM | POA: Diagnosis not present

## 2014-12-22 DIAGNOSIS — Z882 Allergy status to sulfonamides status: Secondary | ICD-10-CM

## 2014-12-22 DIAGNOSIS — Z9981 Dependence on supplemental oxygen: Secondary | ICD-10-CM

## 2014-12-22 DIAGNOSIS — Z9842 Cataract extraction status, left eye: Secondary | ICD-10-CM | POA: Diagnosis not present

## 2014-12-22 DIAGNOSIS — E039 Hypothyroidism, unspecified: Secondary | ICD-10-CM | POA: Diagnosis present

## 2014-12-22 DIAGNOSIS — Z961 Presence of intraocular lens: Secondary | ICD-10-CM | POA: Diagnosis present

## 2014-12-22 DIAGNOSIS — J449 Chronic obstructive pulmonary disease, unspecified: Secondary | ICD-10-CM | POA: Diagnosis present

## 2014-12-22 DIAGNOSIS — Z7951 Long term (current) use of inhaled steroids: Secondary | ICD-10-CM

## 2014-12-22 DIAGNOSIS — Z885 Allergy status to narcotic agent status: Secondary | ICD-10-CM

## 2014-12-22 DIAGNOSIS — E785 Hyperlipidemia, unspecified: Secondary | ICD-10-CM | POA: Diagnosis present

## 2014-12-22 DIAGNOSIS — Z9841 Cataract extraction status, right eye: Secondary | ICD-10-CM | POA: Diagnosis not present

## 2014-12-22 DIAGNOSIS — Z902 Acquired absence of lung [part of]: Secondary | ICD-10-CM | POA: Diagnosis present

## 2014-12-22 HISTORY — DX: Dependence on supplemental oxygen: Z99.81

## 2014-12-22 HISTORY — DX: Unspecified chronic bronchitis: J42

## 2014-12-22 HISTORY — DX: Gastro-esophageal reflux disease without esophagitis: K21.9

## 2014-12-22 HISTORY — DX: Anxiety disorder, unspecified: F41.9

## 2014-12-22 HISTORY — DX: Pneumonia, unspecified organism: J18.9

## 2014-12-22 LAB — BASIC METABOLIC PANEL
Anion gap: 9 (ref 5–15)
BUN: 6 mg/dL (ref 6–20)
CO2: 29 mmol/L (ref 22–32)
Calcium: 9 mg/dL (ref 8.9–10.3)
Chloride: 98 mmol/L — ABNORMAL LOW (ref 101–111)
Creatinine, Ser: 0.79 mg/dL (ref 0.44–1.00)
GFR calc Af Amer: >60 mL/min (ref >60)
GFR calc non Af Amer: >60 mL/min (ref >60)
GLUCOSE: 104 mg/dL — AB (ref 65–99)
POTASSIUM: 4.2 mmol/L (ref 3.5–5.1)
Sodium: 136 mmol/L (ref 135–145)

## 2014-12-22 LAB — CBC WITH DIFFERENTIAL/PLATELET
BASOS ABS: 0 10*3/uL (ref 0.0–0.1)
Basophils Relative: 0 % (ref 0–1)
Eosinophils Absolute: 0 10*3/uL (ref 0.0–0.7)
Eosinophils Relative: 0 % (ref 0–5)
HCT: 37.1 % (ref 36.0–46.0)
Hemoglobin: 11.9 g/dL — ABNORMAL LOW (ref 12.0–15.0)
LYMPHS ABS: 1 10*3/uL (ref 0.7–4.0)
Lymphocytes Relative: 10 % — ABNORMAL LOW (ref 12–46)
MCH: 30.7 pg (ref 26.0–34.0)
MCHC: 32.1 g/dL (ref 30.0–36.0)
MCV: 95.6 fL (ref 78.0–100.0)
MONOS PCT: 7 % (ref 3–12)
Monocytes Absolute: 0.7 10*3/uL (ref 0.1–1.0)
Neutro Abs: 8.7 10*3/uL — ABNORMAL HIGH (ref 1.7–7.7)
Neutrophils Relative %: 83 % — ABNORMAL HIGH (ref 43–77)
PLATELETS: 189 10*3/uL (ref 150–400)
RBC: 3.88 MIL/uL (ref 3.87–5.11)
RDW: 13.5 % (ref 11.5–15.5)
WBC: 10.4 10*3/uL (ref 4.0–10.5)

## 2014-12-22 LAB — I-STAT CG4 LACTIC ACID, ED: Lactic Acid, Venous: 1.94 mmol/L (ref 0.5–2.0)

## 2014-12-22 LAB — STREP PNEUMONIAE URINARY ANTIGEN: Strep Pneumo Urinary Antigen: NEGATIVE

## 2014-12-22 LAB — GLUCOSE, CAPILLARY
Glucose-Capillary: 106 mg/dL — ABNORMAL HIGH (ref 65–99)
Glucose-Capillary: 155 mg/dL — ABNORMAL HIGH (ref 65–99)

## 2014-12-22 MED ORDER — LEVALBUTEROL HCL 1.25 MG/0.5ML IN NEBU
1.2500 mg | INHALATION_SOLUTION | Freq: Four times a day (QID) | RESPIRATORY_TRACT | Status: DC
  Administered 2014-12-23: 1.25 mg via RESPIRATORY_TRACT
  Filled 2014-12-22: qty 0.5

## 2014-12-22 MED ORDER — SODIUM CHLORIDE 0.9 % IV SOLN
INTRAVENOUS | Status: DC
  Administered 2014-12-22: 12:00:00 via INTRAVENOUS

## 2014-12-22 MED ORDER — PROMETHAZINE HCL 25 MG PO TABS: 25.0000 mg | ORAL_TABLET | Freq: Four times a day (QID) | ORAL | Status: DC | PRN

## 2014-12-22 MED ORDER — LEVALBUTEROL HCL 1.25 MG/0.5ML IN NEBU
1.2500 mg | INHALATION_SOLUTION | Freq: Once | RESPIRATORY_TRACT | Status: AC
  Administered 2014-12-22: 1.25 mg via RESPIRATORY_TRACT
  Filled 2014-12-22: qty 0.5

## 2014-12-22 MED ORDER — ALPRAZOLAM 0.5 MG PO TABS
1.0000 mg | ORAL_TABLET | Freq: Every day | ORAL | Status: DC
  Administered 2014-12-22 – 2014-12-24 (×3): 1 mg via ORAL
  Filled 2014-12-22 (×3): qty 2

## 2014-12-22 MED ORDER — ASPIRIN EC 81 MG PO TBEC
81.0000 mg | DELAYED_RELEASE_TABLET | ORAL | Status: DC
  Administered 2014-12-22 – 2014-12-25 (×2): 81 mg via ORAL
  Filled 2014-12-22 (×2): qty 1

## 2014-12-22 MED ORDER — LEVOTHYROXINE SODIUM 100 MCG PO TABS
100.0000 ug | ORAL_TABLET | Freq: Every day | ORAL | Status: DC
  Administered 2014-12-23 – 2014-12-24 (×2): 100 ug via ORAL
  Filled 2014-12-22 (×3): qty 1

## 2014-12-22 MED ORDER — BECLOMETHASONE DIPROPIONATE 80 MCG/ACT IN AERS
2.0000 | INHALATION_SPRAY | Freq: Two times a day (BID) | RESPIRATORY_TRACT | Status: DC
Start: 2014-12-22 — End: 2014-12-22

## 2014-12-22 MED ORDER — ACETAMINOPHEN 325 MG PO TABS
650.0000 mg | ORAL_TABLET | Freq: Once | ORAL | Status: AC
  Administered 2014-12-22: 650 mg via ORAL
  Filled 2014-12-22: qty 2

## 2014-12-22 MED ORDER — ENOXAPARIN SODIUM 40 MG/0.4ML ~~LOC~~ SOLN
40.0000 mg | SUBCUTANEOUS | Status: DC
  Administered 2014-12-22 – 2014-12-25 (×4): 40 mg via SUBCUTANEOUS
  Filled 2014-12-22 (×6): qty 0.4

## 2014-12-22 MED ORDER — ONDANSETRON HCL 4 MG/2ML IJ SOLN
4.0000 mg | Freq: Once | INTRAMUSCULAR | Status: AC
  Administered 2014-12-22: 4 mg via INTRAVENOUS
  Filled 2014-12-22: qty 2

## 2014-12-22 MED ORDER — SODIUM CHLORIDE 0.9 % IV SOLN
INTRAVENOUS | Status: AC
Start: 2014-12-22 — End: 2014-12-23
  Administered 2014-12-22 – 2014-12-23 (×2): via INTRAVENOUS

## 2014-12-22 MED ORDER — GUAIFENESIN ER 600 MG PO TB12
1200.0000 mg | ORAL_TABLET | Freq: Two times a day (BID) | ORAL | Status: DC
  Administered 2014-12-22 – 2014-12-25 (×7): 1200 mg via ORAL
  Filled 2014-12-22 (×9): qty 2

## 2014-12-22 MED ORDER — LEVALBUTEROL HCL 1.25 MG/3ML IN NEBU
1.2500 mg | INHALATION_SOLUTION | Freq: Four times a day (QID) | RESPIRATORY_TRACT | Status: DC
  Administered 2014-12-22: 1.25 mg via RESPIRATORY_TRACT
  Filled 2014-12-22: qty 3
  Filled 2014-12-22: qty 0.5
  Filled 2014-12-22 (×2): qty 3

## 2014-12-22 MED ORDER — LEVALBUTEROL HCL 1.25 MG/3ML IN NEBU
1.2500 mg | INHALATION_SOLUTION | Freq: Four times a day (QID) | RESPIRATORY_TRACT | Status: DC
  Filled 2014-12-22 (×3): qty 3

## 2014-12-22 MED ORDER — METHYLPREDNISOLONE SODIUM SUCC 125 MG IJ SOLR
60.0000 mg | Freq: Two times a day (BID) | INTRAMUSCULAR | Status: DC
  Administered 2014-12-22 – 2014-12-23 (×2): 60 mg via INTRAVENOUS
  Filled 2014-12-22 (×2): qty 0.96
  Filled 2014-12-22 (×2): qty 2

## 2014-12-22 MED ORDER — BUDESONIDE 0.5 MG/2ML IN SUSP
0.5000 mg | Freq: Two times a day (BID) | RESPIRATORY_TRACT | Status: DC
  Administered 2014-12-22 – 2014-12-25 (×6): 0.5 mg via RESPIRATORY_TRACT
  Filled 2014-12-22 (×12): qty 2

## 2014-12-22 MED ORDER — LEVOFLOXACIN IN D5W 500 MG/100ML IV SOLN
500.0000 mg | Freq: Once | INTRAVENOUS | Status: AC
  Administered 2014-12-22: 500 mg via INTRAVENOUS
  Filled 2014-12-22: qty 100

## 2014-12-22 MED ORDER — ATORVASTATIN CALCIUM 40 MG PO TABS
40.0000 mg | ORAL_TABLET | Freq: Every evening | ORAL | Status: DC
  Administered 2014-12-22 – 2014-12-24 (×3): 40 mg via ORAL
  Filled 2014-12-22 (×4): qty 1

## 2014-12-22 MED ORDER — SODIUM CHLORIDE 0.9 % IV BOLUS (SEPSIS)
500.0000 mL | Freq: Once | INTRAVENOUS | Status: AC
  Administered 2014-12-22: 500 mL via INTRAVENOUS

## 2014-12-22 MED ORDER — INSULIN ASPART 100 UNIT/ML ~~LOC~~ SOLN
0.0000 [IU] | Freq: Three times a day (TID) | SUBCUTANEOUS | Status: DC
  Administered 2014-12-23 (×2): 1 [IU] via SUBCUTANEOUS
  Administered 2014-12-23: 3 [IU] via SUBCUTANEOUS
  Administered 2014-12-24 – 2014-12-25 (×4): 1 [IU] via SUBCUTANEOUS

## 2014-12-22 MED ORDER — LEVOFLOXACIN IN D5W 500 MG/100ML IV SOLN
500.0000 mg | INTRAVENOUS | Status: DC
  Administered 2014-12-22 – 2014-12-24 (×3): 500 mg via INTRAVENOUS
  Filled 2014-12-22 (×5): qty 100

## 2014-12-22 NOTE — ED Notes (Signed)
Admitting at bedside 

## 2014-12-22 NOTE — ED Notes (Signed)
After speaking with pt and family at bedside, they have agreed to being admitted to floor for further tx. Dr. Eulis Foster notified.

## 2014-12-22 NOTE — ED Notes (Signed)
Attempted report to 5W. 

## 2014-12-22 NOTE — ED Provider Notes (Signed)
CSN: [id number]     Arrival date & time 12/22/14  0931 History   First MD Initiated Contact with Patient 12/22/14 [phone]     No chief complaint on file.    (Consider location/radiation/quality/duration/timing/severity/associated sxs/prior Treatment) HPI    PCP: Gwendolyn Grant, MD Blood pressure 110/45, pulse 89, temperature 98.3 F (36.8 C), temperature source Oral, resp. rate 22, height '5\' 6"'$  (1.676 m), weight 157 lb (71.215 kg), SpO2 100 %.  [name] is a 81 y.o.female with a significant PMH of allergic rhinitis, COPD (2L [place] oxygen dependent) , hyperlipidemia, emphysema, history of lobectomy presents to the ER with complaints of rigors, low grade temp of 100.5, and coughing. Her first bought of .coughing today had red streaks in it, then it turned to green sputum. Typically her sputum is clear. She is having wheezing and has not taken any medications or eaten any food this morning. She reports having a very busy week since her sister was in town. No lower extremity swelling, diaphoresis,  headache, weakness (general or focal), confusion, change of vision,  neck pain, dysphagia, aphagia, chest pain,   back pain, abdominal pains, nausea, vomiting, diarrhea, dysuria, rash.    Past Medical History  Diagnosis Date  . Allergic rhinitis   . COPD (chronic obstructive pulmonary disease)   . Pelvic cyst   . Bladder atony   . Unspecified hypothyroidism   . Hyperlipidemia   . Non-small cell carcinoma of lung, stage 1 2005    1.4 cm Poorly differentiated Squamous cell RUL  T1N0 resected 09/22/2003  . Arthritis   . Asthma   . Emphysema of lung   . Diarrhea    Past Surgical History  Procedure Laterality Date  . Lung lobectomy  2005    Right Upper Dr Arlyce Dice  . Abdominal hysterectomy    . Spine surgery    . Needle aspiration pelvic cyst  2011    Dr Diona Fanti  . Cataract extraction     Family History  Problem Relation Age of Onset  . Heart disease Father   . Rheumatologic disease  Sister   . Rheumatologic disease Brother   . Cancer Brother     Leukemia   History  Substance Use Topics  . Smoking status: Former Research scientist (life sciences)  . Smokeless tobacco: Not on file     Comment: Quit 2001, Smoked x 42 yrs - 1 PPD  . Alcohol Use: 0.6 oz/week    1 Glasses of wine per week     Comment: occassionally   OB History    No data available     Review of Systems  10 Systems reviewed and are negative for acute change except as noted in the HPI.     Allergies  Daliresp; Tiotropium bromide monohydrate; Ampicillin; Azithromycin; Codeine; Fludrocortisone acetate; Milk-related compounds; Nitrofurantoin; Penicillins; and Sulfonamide derivatives  Home Medications   Prior to Admission medications   Medication Sig Start Date End Date Taking? Authorizing Provider  acetaminophen (TYLENOL ARTHRITIS PAIN) 650 MG CR tablet Take 650 mg by mouth as needed for pain.   Yes Historical Provider, MD  ALPRAZolam Duanne Moron) 1 MG tablet Take 1 tablet (1 mg total) by mouth at bedtime. 11/04/14  Yes Rowe Clack, MD  aspirin EC 81 MG tablet Take 81 mg by mouth 2 (two) times a week.    Yes Historical Provider, MD  atorvastatin (LIPITOR) 40 MG tablet Take 1 tablet (40 mg total) by mouth daily. 11/04/14  Yes Rowe Clack, MD  beclomethasone (  QVAR) 80 MCG/ACT inhaler Inhale 2 puffs into the lungs 2 (two) times daily. 08/25/14  Yes Rowe Clack, MD  levalbuterol Penne Lash) 1.25 MG/3ML nebulizer solution Take 1.25 mg by nebulization every 8 (eight) hours as needed for wheezing or shortness of breath. 10/29/14  Yes Tanda Rockers, MD  levothyroxine (SYNTHROID, LEVOTHROID) 100 MCG tablet Take 1 tablet (100 mcg total) by mouth daily before breakfast. 11/04/14  Yes Rowe Clack, MD  Multiple Vitamins-Minerals (CENTRUM SILVER PO) Take 1 tablet by mouth daily.    Yes Historical Provider, MD  promethazine (PHENERGAN) 25 MG tablet Take 1 tablet (25 mg total) by mouth every 6 (six) hours as needed for nausea  or vomiting. 11/04/14  Yes Rowe Clack, MD  Respiratory Therapy Supplies (FLUTTER) DEVI Blow through 4 times per cycle and repeat 3 cycles per day 10/15/13  Yes Deneise Lever, MD  triamcinolone (KENALOG) 0.1 % paste Use as directed 1 application in the mouth or throat 2 (two) times daily. Patient not taking: Reported on 12/22/2014 11/24/14   [place], FNP   BP 107/43 mmHg  Pulse 94  Temp(Src) 98.3 F (36.8 C) (Oral)  Resp 20  Ht '5\' 6"'$  (1.676 m)  Wt 157 lb (71.215 kg)  BMI 25.35 kg/m2  SpO2 97% Physical Exam  Constitutional: She is oriented to person, place, and time. She appears well-developed and well-nourished. No distress. Nasal cannula in place.  HENT:  Head: Normocephalic and atraumatic.  Right Ear: Tympanic membrane normal.  Left Ear: Tympanic membrane normal.  Nose: Nose normal.  Mouth/Throat: Uvula is midline and oropharynx is clear and moist.  Eyes: Pupils are equal, round, and reactive to light.  Neck: Normal range of motion. Neck supple. No muscular tenderness present.  Cardiovascular: Normal rate and regular rhythm.   Pulmonary/Chest: Effort normal. No accessory muscle usage. No respiratory distress. She has wheezes (diffuse).  Abdominal: Soft. Bowel sounds are normal. There is no tenderness.  Neurological: She is alert and oriented to person, place, and time.  Skin: Skin is warm and dry.  Nursing note and vitals reviewed.   ED Course  Procedures (including critical care time) Labs Review Labs Reviewed  CBC WITH DIFFERENTIAL/PLATELET - Abnormal; Notable for the following:    Hemoglobin 11.9 (*)    Neutrophils Relative % 83 (*)    Neutro Abs 8.7 (*)    Lymphocytes Relative 10 (*)    All other components within normal limits  BASIC METABOLIC PANEL - Abnormal; Notable for the following:    Chloride 98 (*)    Glucose, Bld 104 (*)    All other components within normal limits  I-STAT CG4 LACTIC ACID, ED  I-STAT CG4 LACTIC ACID, ED    Imaging  Review Dg Chest 2 View  12/22/2014   CLINICAL DATA:  Coughing up blood last night. Fever. Generalized chest pain with shortness of breath. History of emphysema.  EXAM: CHEST  2 VIEW  COMPARISON:  06/30/2014 and 10/14/2014  FINDINGS: There is chronic volume loss at the right lung base with pleural thickening and known pleural calcifications. There are increased densities in the right lower lung which are concerning for an acute infectious or inflammatory process. There is a stable linear scar at the right lung base. Again noted is a calcified nodule in the right perihilar region. Lucency in the upper lungs are compatible with emphysema. No significant airspace disease in the left lung. Atherosclerotic calcifications involving the aortic arch.  IMPRESSION: Acute on chronic disease  at the right lung base. Findings concerning for an acute infectious or inflammatory process in the right lower lung.   Electronically Signed   By: Markus Daft M.D.   On: 12/22/2014 10:47     EKG Interpretation   Date/Time:  Monday December 22 2014 09:56:03 EDT Ventricular Rate:  95 PR Interval:  130 QRS Duration: 76 QT Interval:  328 QTC Calculation: 412 R Axis:   70 Text Interpretation:  Sinus rhythm Nonspecific T abnormalities, lateral  leads since last tracing no significant change Confirmed by Eulis Foster  MD,  ELLIOTT ([id number]) on 12/22/2014 10:49:55 AM      MDM   Final diagnoses:  Cough  CAP (community acquired pneumonia)    Dr. Eulis Foster has had face to face contact with the patient. We feel that admission is the best for treatment,  Patient is not in respiratory distress, and a new pneumonia is found. She has been admitted to [facility], telemetry. [place], PA-C  Medications  0.9 %  sodium chloride infusion ( Intravenous New Bag/Given 12/22/14 1159)  acetaminophen (TYLENOL) tablet 650 mg (650 mg Oral Given 12/22/14 1007)  levalbuterol (XOPENEX) nebulizer solution 1.25 mg (1.25 mg Nebulization Given 12/22/14 1104)   levofloxacin (LEVAQUIN) IVPB 500 mg (500 mg Intravenous New Bag/Given 12/22/14 1159)  ondansetron (ZOFRAN) injection 4 mg (4 mg Intravenous Given 12/22/14 1159)  sodium chloride 0.9 % bolus 500 mL (500 mLs Intravenous New Bag/Given 12/22/14 1159)    81 y.o.Marquesa R Fons's evaluation in the Emergency Department is complete. It has been determined that no acute conditions requiring further emergency intervention are present at this time. The patient/guardian have been advised of the diagnosis and plan. We have discussed signs and symptoms that warrant return to the ED, such as changes or worsening in symptoms.  Vital signs are stable at discharge. Filed Vitals:   12/22/14 1300  BP: 107/43  Pulse: 94  Temp:   Resp: 20    Patient/guardian has voiced understanding and agreed to follow-up with the PCP or specialist.    Delos Haring, PA-C 12/22/14 [place], MD 12/22/14 [phone]

## 2014-12-22 NOTE — ED Provider Notes (Signed)
  Face-to-face evaluation   History: She presents complaining of cough with bloody sputum, for 1 day. She has chronic bronchitis and uses both nebulized and inhaled beta agonist. She is on chronic inhaled steroid-dependent. She has had low-grade temperature home.  Physical exam: Elderly, alert, cooperative. Lungs with decreased air movement bilaterally and generalized wheezing.  Medical screening examination/treatment/procedure(s) were conducted as a shared visit with non-physician practitioner(s) and myself.  I personally evaluated the patient during the encounter  Daleen Bo, MD 12/22/14 [phone]

## 2014-12-22 NOTE — Progress Notes (Signed)
Antibiotic consult:  81 yo who was admitted for CAP. Apparently she had some intolerance to PO flagyl the last time that she was here. Her renal fxn is fine here on admission. She'll likely won't require dosing adjustments.  Plan  Cont Levaquin '500mg'$  IV q24 Rx will sign off  Onnie Boer, PharmD Pager: [phone] 12/22/2014 2:57 PM

## 2014-12-22 NOTE — Progress Notes (Signed)
Utilization Review completed. Necola Bluestein RN BSN CM 

## 2014-12-22 NOTE — ED Notes (Signed)
Patient 02 around 89% so nurse said to put her on3l. Now at 93%

## 2014-12-22 NOTE — H&P (Signed)
[facility] History and Physical                                                                                    [name], is a 81 y.o. female  MRN: [id number]   DOB - [birth year]  Admit Date - 12/22/2014  Outpatient Primary MD for the patient is Gwendolyn Grant, MD  Referring Physician:  Delos Haring, ER PA-C  Chief Complaint:  Short of breath   HPI  [name]  is a 81 y.o. female, with COPD on 2 L of oxygen at home. She has a history of lung cancer and is status post lobectomy.  The patient reports that she has had a busy week as her sister is visiting from out of town. She coughs and brings up clear sputum every day but starting Saturday night her sputum was green in color. Today she reports that she has had streaks of red blood in her sputum. This morning she woke at 5:30 AM shaking, freezing and with an elevated temperature of 100.5. She denies any history of aspiration or choking. She reports that she normally gets pneumonia twice a year. She also reports that she is unable to take oral antibiotics. When she was here in December 2015 she was given oral Flagyl for diverticulitis and did not tolerate it well.  In the emergency department chest x-ray shows right lower lobe pneumonia. Initial blood pressure was 82/54 however it responded well to fluids. She requires 3.5 L of oxygen in the emergency department. She will be admitted to a telemetry bed for treatment of right lower lobe pneumonia.  Review of systems   + Temperature of 100.3, rigors, chills. No Headache, No changes with Vision or hearing, No problems swallowing food or Liquids, No Chest pain,+ reports chronic coughing with sputum production. However it is normally not green or red No Abdominal pain, No Nausea or Vomiting, Bowel movements are regular, No Blood in stool or Urine, No dysuria, No new skin rashes or bruises, No new joints pains-aches,  No new weakness, tingling, numbness in any  extremity, No recent weight gain or loss, A full 10 point Review of Systems was done, except as stated above, all other Review of Systems were negative.  Past Medical History  Past Medical History  Diagnosis Date  . Allergic rhinitis   . COPD (chronic obstructive pulmonary disease)   . Pelvic cyst   . Bladder atony   . Unspecified hypothyroidism   . Hyperlipidemia   . Non-small cell carcinoma of lung, stage 1 2005    1.4 cm Poorly differentiated Squamous cell RUL  T1N0 resected 09/22/2003  . Arthritis   . Asthma   . Emphysema of lung   . Diarrhea     Past Surgical History  Procedure Laterality Date  . Lung lobectomy  2005    Right Upper Dr Arlyce Dice  . Abdominal hysterectomy    . Spine surgery    . Needle aspiration pelvic cyst  2011    Dr Diona Fanti  . Cataract extraction        Social History History  Substance Use Topics  . Smoking status: Former Research scientist (life sciences)  .  Smokeless tobacco: Not on file     Comment: Quit 2001, Smoked x 42 yrs - 1 PPD  . Alcohol Use: 0.6 oz/week    1 Glasses of wine per week     Comment: occassionally   quit smoking 11 years ago when she was diagnosed with cancer. Drinks a rare alcoholic beverage. Lives with her husband. Is independent with her ADLs, but will have to stop to use her oxygen and a wheelchair at times when exerting herself.  Family History Family History  Problem Relation Age of Onset  . Heart disease Father   . Rheumatologic disease Sister   . Rheumatologic disease Brother   . Cancer Brother     Leukemia    Prior to Admission medications   Medication Sig Start Date End Date Taking? Authorizing Provider  acetaminophen (TYLENOL ARTHRITIS PAIN) 650 MG CR tablet Take 650 mg by mouth as needed for pain.   Yes Historical Provider, MD  ALPRAZolam Duanne Moron) 1 MG tablet Take 1 tablet (1 mg total) by mouth at bedtime. 11/04/14  Yes Rowe Clack, MD  aspirin EC 81 MG tablet Take 81 mg by mouth 2 (two) times a week.    Yes Historical  Provider, MD  atorvastatin (LIPITOR) 40 MG tablet Take 1 tablet (40 mg total) by mouth daily. 11/04/14  Yes Rowe Clack, MD  beclomethasone (QVAR) 80 MCG/ACT inhaler Inhale 2 puffs into the lungs 2 (two) times daily. 08/25/14  Yes Rowe Clack, MD  levalbuterol Penne Lash) 1.25 MG/3ML nebulizer solution Take 1.25 mg by nebulization every 8 (eight) hours as needed for wheezing or shortness of breath. 10/29/14  Yes Tanda Rockers, MD  levothyroxine (SYNTHROID, LEVOTHROID) 100 MCG tablet Take 1 tablet (100 mcg total) by mouth daily before breakfast. 11/04/14  Yes Rowe Clack, MD  Multiple Vitamins-Minerals (CENTRUM SILVER PO) Take 1 tablet by mouth daily.    Yes Historical Provider, MD  promethazine (PHENERGAN) 25 MG tablet Take 1 tablet (25 mg total) by mouth every 6 (six) hours as needed for nausea or vomiting. 11/04/14  Yes Rowe Clack, MD  Respiratory Therapy Supplies (FLUTTER) DEVI Blow through 4 times per cycle and repeat 3 cycles per day 10/15/13  Yes Deneise Lever, MD    Allergies  Allergen Reactions  . Daliresp [Roflumilast] Nausea Only  . Tiotropium Bromide Monohydrate     Urinary retention  . Ampicillin     REACTION: GI upset  . Azithromycin     REACTION: diarrhea  . Codeine   . Fludrocortisone Acetate Itching  . Milk-Related Compounds   . Nitrofurantoin     REACTION: Upset GI  . Penicillins   . Sulfonamide Derivatives     Physical Exam  Vitals  Blood pressure 108/45, pulse 87, temperature 98.3 F (36.8 C), temperature source Oral, resp. rate 19, height '5\' 6"'$  (1.676 m), weight 71.215 kg (157 lb), SpO2 97 %.   General:  Elderly female, on oxygen via nasal cannula, lying in bed in NAD, daughter at bedside.  Psych:  Normal affect and insight, Not Suicidal or Homicidal, Awake Alert, Oriented X 3.  Neuro:   No F.N deficits, ALL C.Nerves Intact, Strength 5/5 all 4 extremities, Sensation intact all 4 extremities.  ENT:  Ears and Eyes appear Normal,  Conjunctivae clear, PER. Moist oral mucosa without erythema or exudates.  Neck:  Supple, No lymphadenopathy appreciated  Respiratory:  Mild expiratory wheeze noted bilaterally. No accessory muscle use.  Cardiac:  RRR, No Murmurs, no  LE edema noted, no JVD.    Abdomen:  Positive bowel sounds, Soft, Non tender, Non distended,  No masses appreciated  Skin:  No Cyanosis, Normal Skin Turgor, No Skin Rash or Bruise.  Extremities:  Able to move all 4. 5/5 strength in each,  no effusions.  Data Review  CBC  Recent Labs Lab 12/22/14 1000  WBC 10.4  HGB 11.9*  HCT 37.1  PLT 189  MCV 95.6  MCH 30.7  MCHC 32.1  RDW 13.5  LYMPHSABS 1.0  MONOABS 0.7  EOSABS 0.0  BASOSABS 0.0    Chemistries   Recent Labs Lab 12/22/14 1000  NA 136  K 4.2  CL 98*  CO2 29  GLUCOSE 104*  BUN 6  CREATININE 0.79  CALCIUM 9.0     Urinalysis    Component Value Date/Time   COLORURINE YELLOW 11/04/2014 [place] 11/04/2014 1214   LABSPEC <=1.005* 11/04/2014 1214   PHURINE 7.0 11/04/2014 [place] 11/04/2014 1214   GLUCOSEU NEGATIVE 05/27/2014 1819   HGBUR NEGATIVE 11/04/2014 1214   HGBUR negative 07/30/2009 1254   BILIRUBINUR NEGATIVE 11/04/2014 1214   BILIRUBINUR neg 10/10/2012 1334   KETONESUR NEGATIVE 11/04/2014 1214   PROTEINUR NEGATIVE 05/27/2014 1819   PROTEINUR neg 10/10/2012 1334   UROBILINOGEN 0.2 11/04/2014 1214   UROBILINOGEN 0.2 10/10/2012 1334   NITRITE NEGATIVE 11/04/2014 1214   NITRITE neg 10/10/2012 1334   LEUKOCYTESUR TRACE* 11/04/2014 1214    Imaging results:   Dg Chest 2 View  12/22/2014   CLINICAL DATA:  Coughing up blood last night. Fever. Generalized chest pain with shortness of breath. History of emphysema.  EXAM: CHEST  2 VIEW  COMPARISON:  06/30/2014 and 10/14/2014  FINDINGS: There is chronic volume loss at the right lung base with pleural thickening and known pleural calcifications. There are increased densities in the right  lower lung which are concerning for an acute infectious or inflammatory process. There is a stable linear scar at the right lung base. Again noted is a calcified nodule in the right perihilar region. Lucency in the upper lungs are compatible with emphysema. No significant airspace disease in the left lung. Atherosclerotic calcifications involving the aortic arch.  IMPRESSION: Acute on chronic disease at the right lung base. Findings concerning for an acute infectious or inflammatory process in the right lower lung.   Electronically Signed   By: Markus Daft M.D.   On: 12/22/2014 10:47    My personal review of EKG:   Sinus rhythm, normal QT. No significant change since last tracing.   Assessment & Plan  Principal Problem:   CAP (community acquired pneumonia) Active Problems:   Hypotension   Hypothyroidism   Dyslipidemia   COPD (chronic obstructive pulmonary disease)  Community acquired pneumonia Patient with right lower lobe pneumonia on chest x-ray Started on IV Levaquin in the ER. Patient believes she cannot take oral antibiotics but I suspect she simply has an intolerance to oral Flagyl. Check urine for Legionella antigen and strep pneumo antigen, check sputum culture. In addition to Levaquin will add every 6 nebulizers, Mucinex, flutter valve, Solu-Medrol  Hypotension (82/54). Responded well to IV fluids and BP now stable.  History of COPD and lung cancer status post lobectomy She is normally on 2 L of oxygen at home.  Patient is currently wheezing.  Continue home COPD medications. Continue oxygen Solu-Medrol 60 mg every 12, Mucinex, flutter valve, nebulizers and check ambulating oxygen saturations.  Hypothyroidism Continue Synthroid  Dyslipidemia Continue statin.  Consultants Called:  None  Family Communication:   Daughter at bedside  Code Status: Full code  Condition:  Guarded Potential Disposition: To home versus SNFin approximately 72 hours  Time spent in minutes :  [address]  PA-C on 12/22/2014 at 2:18 PM Between 7am to 7pm - Pager - [phone] After 7pm go to [email or web address] - password [email or web address] And look for the night coverage person covering me after hours  [facility]

## 2014-12-22 NOTE — ED Notes (Signed)
Xoponex requested from pharmacy.

## 2014-12-22 NOTE — Progress Notes (Signed)
NURSING PROGRESS NOTE  [name] [id number] Admission Data: 12/22/2014 2:45 PM Attending Provider: Albertine Patricia, MD [id number] Asa Lente, MD Code Status: Full Allergies:  Daliresp; Tiotropium bromide monohydrate; Ampicillin; Azithromycin; Codeine; Fludrocortisone acetate; Milk-related compounds; Nitrofurantoin; Penicillins; and Sulfonamide derivatives Past Medical History:   has a past medical history of Allergic rhinitis; COPD (chronic obstructive pulmonary disease); Pelvic cyst; Bladder atony; Unspecified hypothyroidism; Hyperlipidemia; Non-small cell carcinoma of lung, stage 1 (2005); Arthritis; Asthma; Emphysema of lung; and Diarrhea. Past Surgical History:   has past surgical history that includes Lung lobectomy (2005); Abdominal hysterectomy; Spine surgery; Needle aspiration Pelvic cyst (2011); and Cataract extraction. Social History:   reports that she has quit smoking. She does not have any smokeless tobacco history on file. She reports that she drinks about 0.6 oz of alcohol per week. She reports that she does not use illicit drugs.  [name] is a 81 y.o. female patient admitted from ED:   Last Documented Vital Signs: Blood pressure 109/70, pulse 80, temperature 98 F (36.7 C), temperature source Oral, resp. rate 16, height '5\' 6"'$  (1.676 m), weight 73.029 kg (161 lb), SpO2 92 %.  Cardiac Monitoring:  None ordered.  IV Fluids:  IV in place, occlusive dsg intact without redness, IV cath forearm right, condition patent and no redness normal saline.   Skin: WDL  Patient/Family orientated to room. Information packet given to patient/family. Admission inpatient armband information verified with patient/family to include name and date of birth and placed on patient arm. Side rails up x 2, fall assessment and education completed with patient/family. Patient/family able to verbalize understanding of risk associated with falls and verbalized understanding to call for  assistance before getting out of bed. Call light within reach. Patient/family able to voice and demonstrate understanding of unit orientation instructions.    Will continue to evaluate and treat per MD orders.   Hendricks Limes RN, BS, BSN

## 2014-12-22 NOTE — ED Notes (Signed)
Pt arrives from home via POV. Pt states she woke up at 0600 this morning with a "bad cough" and at first had bloody sputum which then became white and now is thick green secretions. Pt states she has a chronic cough from emphysema, a hx of right upper lobe lung cancer that resulted in a lobectomy. Pt states she also had a temp of 100.5 upon awaking and was experiencing chills. Pt didn't take any meds for fever and is afebrile upon arrival to ED.

## 2014-12-23 DIAGNOSIS — J449 Chronic obstructive pulmonary disease, unspecified: Secondary | ICD-10-CM

## 2014-12-23 LAB — GLUCOSE, CAPILLARY
GLUCOSE-CAPILLARY: 161 mg/dL — AB (ref 65–99)
Glucose-Capillary: 144 mg/dL — ABNORMAL HIGH (ref 65–99)
Glucose-Capillary: 206 mg/dL — ABNORMAL HIGH (ref 65–99)
Glucose-Capillary: 131 mg/dL — ABNORMAL HIGH (ref 65–99)

## 2014-12-23 LAB — LEGIONELLA ANTIGEN, URINE

## 2014-12-23 MED ORDER — LEVALBUTEROL HCL 1.25 MG/0.5ML IN NEBU
1.2500 mg | INHALATION_SOLUTION | Freq: Two times a day (BID) | RESPIRATORY_TRACT | Status: DC
  Administered 2014-12-23 – 2014-12-25 (×4): 1.25 mg via RESPIRATORY_TRACT
  Filled 2014-12-23 (×8): qty 0.5

## 2014-12-23 MED ORDER — CETYLPYRIDINIUM CHLORIDE 0.05 % MT LIQD
7.0000 mL | Freq: Two times a day (BID) | OROMUCOSAL | Status: DC
  Administered 2014-12-23 – 2014-12-25 (×4): 7 mL via OROMUCOSAL

## 2014-12-23 MED ORDER — METHYLPREDNISOLONE SODIUM SUCC 40 MG IJ SOLR
40.0000 mg | Freq: Two times a day (BID) | INTRAMUSCULAR | Status: DC
  Administered 2014-12-23 – 2014-12-24 (×3): 40 mg via INTRAVENOUS
  Filled 2014-12-23 (×5): qty 1

## 2014-12-23 MED ORDER — LEVALBUTEROL HCL 0.63 MG/3ML IN NEBU: 0.6300 mg | INHALATION_SOLUTION | Freq: Four times a day (QID) | RESPIRATORY_TRACT | Status: DC | PRN

## 2014-12-23 NOTE — Progress Notes (Signed)
Spoke with patient about home health needs. Patient states that she has home O2, 3 in 1, shower seat. She states that she can drive and is independent. She denies needing any help at home, stating that she does housework etc without assistance, but does need to take breaks and use her oxygen during exertion. She has assistance from her husband. Cm will follow for further needs.

## 2014-12-23 NOTE — Evaluation (Signed)
Clinical/Bedside Swallow Evaluation Patient Details  Name: [name] MRN: [id number] Date of Birth: 12-02-1932  Today's Date: 12/23/2014 Time: SLP Start Time (ACUTE ONLY): 48 SLP Stop Time (ACUTE ONLY): 1124 SLP Time Calculation (min) (ACUTE ONLY): 21 min  Past Medical History:  Past Medical History  Diagnosis Date  . Allergic rhinitis   . COPD (chronic obstructive pulmonary disease)   . Pelvic cyst   . Bladder atony   . Unspecified hypothyroidism   . Hyperlipidemia   . Non-small cell carcinoma of lung, stage 1 2005    1.4 cm Poorly differentiated Squamous cell RUL  T1N0 resected 09/22/2003  . Asthma   . Emphysema of lung   . Diarrhea   . On home oxygen therapy     "3L q night and prn during the day" (12/22/2014)  . Pneumonia     "this is the 3rd time that I can remember" (12/22/2014)  . Chronic bronchitis   . GERD (gastroesophageal reflux disease)     "w/spicey foods" (12/22/2014)  . Arthritis     "back, hands; hips" (12/22/2014)  . Anxiety    Past Surgical History:  Past Surgical History  Procedure Laterality Date  . Video assisted thoracoscopy (vats)/thorocotomy Right 09/2003    thoracotomy, right upper lobectomy with node dissection; Dr Demetrios Loll 11/02/2010  . Needle aspiration pelvic cyst  2011    Dr Diona Fanti  . Cataract extraction w/ intraocular lens  implant, bilateral Bilateral ~ 2012  . Back surgery    . Lumbar disc surgery  1970's  . Abdominal hysterectomy  ~ 1976  . Dilation and curettage of uterus    . Lymph node dissection  2005    "all my lymph nodes under breasts, went thru my back; Dr. Arlyce Dice did it"  . Video bronchoscopy  03/2004    /notes 11/02/2010   HPI:  [name] is a 81 y.o. female who presents with SOB and productive cough. CXR shows RLL PNA. PMH: COPD on 2 L of oxygen at home, lung cancer s/p lobectomy, asthma, and GERD.   Assessment / Plan / Recommendation Clinical Impression  Pt appears to have good coordination of  respriation/swallow, although some delayed coughing was noted with trials of puree. Pt says that this does not typically happen during meals, and that she has been frequently coughing from current infection. No other overt signs of aspiration were observed across trials, including challenging with the 3 oz water test. Recommend to continue current diet with use of aspiration and reflux precautions. SLP to f/u briefly for tolerance given risk factors for dysphagia and current RLL PNA.    Aspiration Risk  Mild    Diet Recommendation Age appropriate regular solids;Thin   Medication Administration: Whole meds with liquid Compensations: Slow rate;Small sips/bites;Follow solids with liquid    Other  Recommendations Oral Care Recommendations: Oral care BID   Follow Up Recommendations   tba    Frequency and Duration min 1 x/week  1 week   Pertinent Vitals/Pain n/a    SLP Swallow Goals     Swallow Study Prior Functional Status       General Other Pertinent Information: [name] is a 81 y.o. female who presents with SOB and productive cough. CXR shows RLL PNA. PMH: COPD on 2 L of oxygen at home, lung cancer s/p lobectomy, asthma, and GERD. Type of Study: Bedside swallow evaluation Previous Swallow Assessment: none in chart Diet Prior to this Study: Regular;Thin liquids Temperature Spikes Noted: No Respiratory Status: Supplemental  O2 delivered via (comment) ([place]) History of Recent Intubation: No Behavior/Cognition: Alert;Cooperative;Pleasant mood Oral Cavity - Dentition: Missing dentition Self-Feeding Abilities: Able to feed self Patient Positioning: Upright in chair/Tumbleform Baseline Vocal Quality: Normal    Oral/Motor/Sensory Function Overall Oral Motor/Sensory Function: Appears within functional limits for tasks assessed   Ice Chips Ice chips: Not tested   Thin Liquid Thin Liquid: Within functional limits Presentation: Cup;Self Fed;Straw    Nectar Thick Nectar Thick  Liquid: Not tested   Honey Thick Honey Thick Liquid: Not tested   Puree Puree: Impaired Presentation: Self Fed;Spoon Pharyngeal Phase Impairments: Cough - Delayed   Solid    Solid: Within functional limits Presentation: Self Fed      Germain Osgood, M.A. CCC-SLP [phone]  Germain Osgood 12/23/2014,11:40 AM

## 2014-12-23 NOTE — Progress Notes (Signed)
[facility] PROGRESS NOTE  [name] UDJ:[id number] DOB: [birth year] DOA: 12/22/2014 PCP: Gwendolyn Grant, MD  Assessment/Plan: Community acquired pneumonia  -given RLL involvement, need to r/o aspiration -SLP eval -continue IV Levaquin, patient with significant intolerances to multiple Po Abx -FU urine for Legionella antigen and strep pneumo antigen, sputum culture. -Mucinex, flutter valve  Mild COPD exacerbation -no wheezing at this time, cut down solumedrol, xopenex, levaquin -Mucinex, flutter valve, nebulizers and check ambulating oxygen saturations.  Hypotension (82/54). Responded well to IV fluids and BP now stable.  History of COPD and lung cancer status post lobectomy She is normally on 2 L of oxygen at home.   Hypothyroidism Continue Synthroid  Dyslipidemia Continue statin.  Code Status: Full Code Family Communication: none at bedside Disposition Plan: home in 1-2days   HPI/Subjective: Breathing better  Objective: Filed Vitals:   12/23/14 0500  BP: 105/68  Pulse: 75  Temp: 98.4 F (36.9 C)  Resp: 18    Intake/Output Summary (Last 24 hours) at 12/23/14 1223 Last data filed at 12/23/14 1000  Gross per 24 hour  Intake   1875 ml  Output   1850 ml  Net     25 ml   Filed Weights   12/22/14 0953 12/22/14 1441  Weight: 71.215 kg (157 lb) 73.029 kg (161 lb)    Exam:   General:  AAOx3  Cardiovascular: S1s2/RRR  Respiratory:poor air movement  Abdomen: soft, NT, BS present  Musculoskeletal: no edema c/c   Data Reviewed: Basic Metabolic Panel:  Recent Labs Lab 12/22/14 1000  NA 136  K 4.2  CL 98*  CO2 29  GLUCOSE 104*  BUN 6  CREATININE 0.79  CALCIUM 9.0   Liver Function Tests: No results for input(s): AST, ALT, ALKPHOS, BILITOT, PROT, ALBUMIN in the last 168 hours. No results for input(s): LIPASE, AMYLASE in the last 168 hours. No results for input(s): AMMONIA in the last 168 hours. CBC:  Recent Labs Lab  12/22/14 1000  WBC 10.4  NEUTROABS 8.7*  HGB 11.9*  HCT 37.1  MCV 95.6  PLT 189   Cardiac Enzymes: No results for input(s): CKTOTAL, CKMB, CKMBINDEX, TROPONINI in the last 168 hours. BNP (last 3 results) No results for input(s): BNP in the last 8760 hours.  ProBNP (last 3 results) No results for input(s): PROBNP in the last 8760 hours.  CBG:  Recent Labs Lab 12/22/14 1710 12/22/14 2220 12/23/14 0759 12/23/14 1206  GLUCAP 106* 155* 144* 206*    No results found for this or any previous visit (from the past 240 hour(s)).   Studies: Dg Chest 2 View  12/22/2014   CLINICAL DATA:  Coughing up blood last night. Fever. Generalized chest pain with shortness of breath. History of emphysema.  EXAM: CHEST  2 VIEW  COMPARISON:  06/30/2014 and 10/14/2014  FINDINGS: There is chronic volume loss at the right lung base with pleural thickening and known pleural calcifications. There are increased densities in the right lower lung which are concerning for an acute infectious or inflammatory process. There is a stable linear scar at the right lung base. Again noted is a calcified nodule in the right perihilar region. Lucency in the upper lungs are compatible with emphysema. No significant airspace disease in the left lung. Atherosclerotic calcifications involving the aortic arch.  IMPRESSION: Acute on chronic disease at the right lung base. Findings concerning for an acute infectious or inflammatory process in the right lower lung.   Electronically Signed   By: Markus Daft  M.D.   On: 12/22/2014 10:47    Scheduled Meds: . ALPRAZolam  1 mg Oral QHS  . antiseptic oral rinse  7 mL Mouth Rinse BID  . aspirin EC  81 mg Oral Once per day on Mon Thu  . atorvastatin  40 mg Oral QPM  . budesonide (PULMICORT) nebulizer solution  0.5 mg Nebulization BID  . enoxaparin (LOVENOX) injection  40 mg Subcutaneous Q24H  . guaiFENesin  1,200 mg Oral BID  . insulin aspart  0-9 Units Subcutaneous TID WC  .  levalbuterol  1.25 mg Nebulization BID  . levofloxacin (LEVAQUIN) IV  500 mg Intravenous Q24H  . levothyroxine  100 mcg Oral QAC breakfast  . methylPREDNISolone (SOLU-MEDROL) injection  60 mg Intravenous Q12H   Continuous Infusions:  Antibiotics Given (last 72 hours)    Date/Time Action Medication Dose Rate   12/22/14 1627 Given   levofloxacin (LEVAQUIN) IVPB 500 mg 500 mg 100 mL/hr      Principal Problem:   CAP (community acquired pneumonia) Active Problems:   Hypothyroidism   Dyslipidemia   COPD (chronic obstructive pulmonary disease)   Hypotension    Time spent: 46mn    [facility] Pager [phone] If 7PM-7AM, please contact night-coverage at [email or web address], password [facility]7/10/2014, 12:23 PM  LOS: 1 day

## 2014-12-24 LAB — CBC
HCT: 34.4 % — ABNORMAL LOW (ref 36.0–46.0)
HEMOGLOBIN: 10.8 g/dL — AB (ref 12.0–15.0)
MCH: 30.4 pg (ref 26.0–34.0)
MCHC: 31.4 g/dL (ref 30.0–36.0)
MCV: 96.9 fL (ref 78.0–100.0)
Platelets: 202 10*3/uL (ref 150–400)
RBC: 3.55 MIL/uL — AB (ref 3.87–5.11)
RDW: 13.8 % (ref 11.5–15.5)
WBC: 12.5 10*3/uL — ABNORMAL HIGH (ref 4.0–10.5)

## 2014-12-24 LAB — GLUCOSE, CAPILLARY
GLUCOSE-CAPILLARY: 131 mg/dL — AB (ref 65–99)
GLUCOSE-CAPILLARY: 128 mg/dL — AB (ref 65–99)
GLUCOSE-CAPILLARY: 110 mg/dL — AB (ref 65–99)
Glucose-Capillary: 158 mg/dL — ABNORMAL HIGH (ref 65–99)

## 2014-12-24 LAB — BASIC METABOLIC PANEL
Anion gap: 6 (ref 5–15)
BUN: 8 mg/dL (ref 6–20)
CO2: 28 mmol/L (ref 22–32)
Calcium: 9.4 mg/dL (ref 8.9–10.3)
Chloride: 105 mmol/L (ref 101–111)
Creatinine, Ser: 0.76 mg/dL (ref 0.44–1.00)
GFR calc non Af Amer: >60 mL/min (ref >60)
Glucose, Bld: 148 mg/dL — ABNORMAL HIGH (ref 65–99)
POTASSIUM: 4.2 mmol/L (ref 3.5–5.1)
Sodium: 139 mmol/L (ref 135–145)

## 2014-12-24 MED ORDER — METHYLPREDNISOLONE SODIUM SUCC 125 MG IJ SOLR
60.0000 mg | Freq: Three times a day (TID) | INTRAMUSCULAR | Status: DC
  Administered 2014-12-24 – 2014-12-25 (×2): 60 mg via INTRAVENOUS
  Filled 2014-12-24 (×2): qty 0.96
  Filled 2014-12-24: qty 2
  Filled 2014-12-24: qty 0.96
  Filled 2014-12-24: qty 2

## 2014-12-24 MED ORDER — LEVOTHYROXINE SODIUM 100 MCG PO TABS
100.0000 ug | ORAL_TABLET | Freq: Every day | ORAL | Status: DC
  Administered 2014-12-25: 100 ug via ORAL
  Filled 2014-12-24: qty 1

## 2014-12-24 MED ORDER — RISAQUAD PO CAPS
1.0000 | ORAL_CAPSULE | Freq: Every day | ORAL | Status: DC
  Administered 2014-12-24 – 2014-12-25 (×2): 1 via ORAL
  Filled 2014-12-24 (×3): qty 1

## 2014-12-24 MED ORDER — POLYETHYLENE GLYCOL 3350 17 G PO PACK
17.0000 g | PACK | Freq: Once | ORAL | Status: AC
  Administered 2014-12-24: 17 g via ORAL
  Filled 2014-12-24: qty 1

## 2014-12-24 NOTE — Care Management (Signed)
Important Message  Patient Details  Name: [name] MRN: [id number] Date of Birth: 1933-02-08   Medicare Important Message Given:  Yes-second notification given    Delorse Lek 12/24/2014, 12:43 PM

## 2014-12-24 NOTE — Progress Notes (Signed)
[facility] PROGRESS NOTE  [name] WUX:[id number] DOB: Aug 30, 1932 DOA: 12/22/2014 PCP: Gwendolyn Grant, MD  Assessment/Plan: Community acquired pneumonia  -continue IV Levaquin, patient with significant intolerances to multiple Po Abx - SLP evaluation indicate mild risk for aspiration - urine for Legionella antigen and strep pneumo antigen are negative, sputum culture with no growth -Mucinex, flutter valve, nebs  Mild COPD exacerbation -Patient with significant wheezing on physical exam today, will increase her Solu-Medrol, xopenex, levaquin -Mucinex, flutter valve, nebulizers and check ambulating oxygen saturations.  Hypotension (82/54). Responded well to IV fluids and BP now stable.  History of COPD and lung cancer status post lobectomy She is normally on 2 L of oxygen at home.   Hypothyroidism Continue Synthroid  Dyslipidemia Continue statin.  Code Status: Full Code Family Communication: none at bedside Disposition Plan: home in 1-2days   HPI/Subjective: Breathing better, but reports having cough with green phlegm, denies any chest pain.  Objective: Filed Vitals:   12/24/14 1400  BP: 152/68  Pulse: 88  Temp: 98.1 F (36.7 C)  Resp: 18    Intake/Output Summary (Last 24 hours) at 12/24/14 1531 Last data filed at 12/24/14 1359  Gross per 24 hour  Intake    480 ml  Output   1550 ml  Net  -1070 ml   Filed Weights   12/22/14 0953 12/22/14 1441  Weight: 71.215 kg (157 lb) 73.029 kg (161 lb)    Exam:   General:  AAOx3  Cardiovascular: S1s2/RRR  Respiratory:poor air movement, significant wheezing bilaterally  Abdomen: soft, NT, BS present  Musculoskeletal: no edema c/c   Data Reviewed: Basic Metabolic Panel:  Recent Labs Lab 12/22/14 1000 12/24/14 0530  NA 136 139  K 4.2 4.2  CL 98* 105  CO2 29 28  GLUCOSE 104* 148*  BUN 6 8  CREATININE 0.79 0.76  CALCIUM 9.0 9.4   Liver Function Tests: No results for input(s): AST,  ALT, ALKPHOS, BILITOT, PROT, ALBUMIN in the last 168 hours. No results for input(s): LIPASE, AMYLASE in the last 168 hours. No results for input(s): AMMONIA in the last 168 hours. CBC:  Recent Labs Lab 12/22/14 1000 12/24/14 0530  WBC 10.4 12.5*  NEUTROABS 8.7*  --   HGB 11.9* 10.8*  HCT 37.1 34.4*  MCV 95.6 96.9  PLT 189 202   Cardiac Enzymes: No results for input(s): CKTOTAL, CKMB, CKMBINDEX, TROPONINI in the last 168 hours. BNP (last 3 results) No results for input(s): BNP in the last 8760 hours.  ProBNP (last 3 results) No results for input(s): PROBNP in the last 8760 hours.  CBG:  Recent Labs Lab 12/23/14 1206 12/23/14 1636 12/23/14 2231 12/24/14 0747 12/24/14 1205  GLUCAP 206* 131* 161* 131* 128*    No results found for this or any previous visit (from the past 240 hour(s)).   Studies: No results found.  Scheduled Meds: . acidophilus  1 capsule Oral Daily  . ALPRAZolam  1 mg Oral QHS  . antiseptic oral rinse  7 mL Mouth Rinse BID  . aspirin EC  81 mg Oral Once per day on Mon Thu  . atorvastatin  40 mg Oral QPM  . budesonide (PULMICORT) nebulizer solution  0.5 mg Nebulization BID  . enoxaparin (LOVENOX) injection  40 mg Subcutaneous Q24H  . guaiFENesin  1,200 mg Oral BID  . insulin aspart  0-9 Units Subcutaneous TID WC  . levalbuterol  1.25 mg Nebulization BID  . levofloxacin (LEVAQUIN) IV  500 mg Intravenous Q24H  .  levothyroxine  100 mcg Oral QAC breakfast  . methylPREDNISolone (SOLU-MEDROL) injection  60 mg Intravenous 3 times per day  . polyethylene glycol  17 g Oral Once   Continuous Infusions:  Antibiotics Given (last 72 hours)    Date/Time Action Medication Dose Rate   12/22/14 1627 Given   levofloxacin (LEVAQUIN) IVPB 500 mg 500 mg 100 mL/hr   12/23/14 1450 Given   levofloxacin (LEVAQUIN) IVPB 500 mg 500 mg 100 mL/hr   12/24/14 1443 Given   levofloxacin (LEVAQUIN) IVPB 500 mg 500 mg 100 mL/hr      Principal Problem:   CAP (community  acquired pneumonia) Active Problems:   Hypothyroidism   Dyslipidemia   COPD (chronic obstructive pulmonary disease)   Hypotension    Time spent: 30 min    [name]  [facility] Pager [phone]. If 7PM-7AM, please contact night-coverage at [email or web address], password [facility] 12/24/2014, 3:31 PM  LOS: 2 days

## 2014-12-24 NOTE — Progress Notes (Signed)
Speech Language Pathology Treatment: Dysphagia  Patient Details Name: [name] MRN: [id number] DOB: Dec 28, 1932 Today's Date: 12/24/2014 Time: 3545-6256 SLP Time Calculation (min) (ACUTE ONLY): 14 min  Assessment / Plan / Recommendation Clinical Impression  Dysphagia treatment during breakfast. Pt reports frequent coughing in the morning after breathing treatment. No oropharyngeal difficulty observed. SLP educated pt on increased aspiration risk with COPD especially if short of breath during meals and cues to take rest breaks if needed. Modified independence with swallow precautions during meals. Continue regular texture and thin liquids with swallow precautions. ST to sign off.   HPI Other Pertinent Information: [name] is a 81 y.o. female who presents with SOB and productive cough. CXR shows RLL PNA. PMH: COPD on 2 L of oxygen at home, lung cancer s/p lobectomy, asthma, and GERD.   Pertinent Vitals Pain Assessment: No/denies pain  SLP Plan  All goals met;Discharge SLP treatment due to (comment)    Recommendations Diet recommendations: Regular;Thin liquid Liquids provided via: Cup;Straw Medication Administration: Whole meds with liquid Supervision: Patient able to self feed Compensations: Slow rate;Small sips/bites;Follow solids with liquid Postural Changes and/or Swallow Maneuvers: Seated upright 90 degrees;Upright 30-60 min after meal              Oral Care Recommendations: Oral care BID Follow up Recommendations: None Plan: All goals met;Discharge SLP treatment due to (comment)    GO     [name] 12/24/2014, 8:20 AM   [name] [name].Ed [employer] [phone]

## 2014-12-25 LAB — GLUCOSE, CAPILLARY
GLUCOSE-CAPILLARY: 125 mg/dL — AB (ref 65–99)
Glucose-Capillary: 133 mg/dL — ABNORMAL HIGH (ref 65–99)
Glucose-Capillary: 92 mg/dL (ref 65–99)

## 2014-12-25 MED ORDER — PREDNISONE 10 MG PO TABS
ORAL_TABLET | ORAL | Status: DC
Start: 2014-12-25 — End: 2015-01-21

## 2014-12-25 MED ORDER — RISAQUAD PO CAPS: 1.0000 | ORAL_CAPSULE | Freq: Every day | ORAL | Status: DC

## 2014-12-25 MED ORDER — LEVOFLOXACIN 500 MG PO TABS
500.0000 mg | ORAL_TABLET | Freq: Every day | ORAL | Status: DC
  Administered 2014-12-25: 500 mg via ORAL
  Filled 2014-12-25 (×2): qty 1

## 2014-12-25 MED ORDER — METHYLPREDNISOLONE SODIUM SUCC 40 MG IJ SOLR: 40.0000 mg | Freq: Two times a day (BID) | INTRAMUSCULAR | Status: DC

## 2014-12-25 MED ORDER — LEVOFLOXACIN 500 MG PO TABS: 500.0000 mg | ORAL_TABLET | Freq: Every day | ORAL | Status: AC

## 2014-12-25 MED ORDER — ONDANSETRON HCL 4 MG PO TABS: 4.0000 mg | ORAL_TABLET | Freq: Three times a day (TID) | ORAL | Status: DC | PRN

## 2014-12-25 MED ORDER — ONDANSETRON HCL 8 MG PO TABS
8.0000 mg | ORAL_TABLET | Freq: Once | ORAL | Status: AC
  Administered 2014-12-25: 8 mg via ORAL
  Filled 2014-12-25: qty 1
  Filled 2014-12-25: qty 2

## 2014-12-25 NOTE — Discharge Summary (Signed)
ALUNA WHISTON, is a 81 y.o. female  DOB Mar 13, 1933  MRN [id number].  Admission date:  12/22/2014  Admitting Physician  Albertine Patricia, MD  Discharge Date:  12/25/2014   Primary MD  Gwendolyn Grant, MD  Recommendations for primary care physician for things to follow:  - Check CBC, BMP during next visit, check chest x-ray in 4 weeks.   Admission Diagnosis  Cough [R05] CAP (community acquired pneumonia) [J18.9]   Discharge Diagnosis  Cough [R05] CAP (community acquired pneumonia) [J18.9]    Principal Problem:   CAP (community acquired pneumonia) Active Problems:   Hypothyroidism   Dyslipidemia   COPD (chronic obstructive pulmonary disease)   Hypotension      Past Medical History  Diagnosis Date  . Allergic rhinitis   . COPD (chronic obstructive pulmonary disease)   . Pelvic cyst   . Bladder atony   . Unspecified hypothyroidism   . Hyperlipidemia   . Non-small cell carcinoma of lung, stage 1 2005    1.4 cm Poorly differentiated Squamous cell RUL  T1N0 resected 09/22/2003  . Asthma   . Emphysema of lung   . Diarrhea   . On home oxygen therapy     "3L q night and prn during the day" (12/22/2014)  . Pneumonia     "this is the 3rd time that I can remember" (12/22/2014)  . Chronic bronchitis   . GERD (gastroesophageal reflux disease)     "w/spicey foods" (12/22/2014)  . Arthritis     "back, hands; hips" (12/22/2014)  . Anxiety     Past Surgical History  Procedure Laterality Date  . Video assisted thoracoscopy (vats)/thorocotomy Right 09/2003    thoracotomy, right upper lobectomy with node dissection; Dr Demetrios Loll 11/02/2010  . Needle aspiration pelvic cyst  2011    Dr Diona Fanti  . Cataract extraction w/ intraocular lens  implant, bilateral Bilateral ~ 2012  . Back surgery    . Lumbar disc surgery  1970's  . Abdominal hysterectomy  ~ 1976  . Dilation and curettage of uterus    .  Lymph node dissection  2005    "all my lymph nodes under breasts, went thru my back; Dr. Arlyce Dice did it"  . Video bronchoscopy  03/2004    /notes 11/02/2010       History of present illness and  Hospital Course:     Kindly see H&P for history of present illness and admission details, please review complete Labs, Consult reports and Test reports for all details in brief  HPI  from the history and physical done on the day of admission 7/4  Ridhima Golberg is a 81 y.o. female, with COPD on 2 L of oxygen at home. She has a history of lung cancer and is status post lobectomy. The patient reports that she has had a busy week as her sister is visiting from out of town. She coughs and brings up clear sputum every day but starting Saturday night her sputum was green in color. Today she reports that  she has had streaks of red blood in her sputum. This morning she woke at 5:30 AM shaking, freezing and with an elevated temperature of 100.5. She denies any history of aspiration or choking. She reports that she normally gets pneumonia twice a year. She also reports that she is unable to take oral antibiotics. When she was here in December 2015 she was given oral Flagyl for diverticulitis and did not tolerate it well.  In the emergency department chest x-ray shows right lower lobe pneumonia. Initial blood pressure was 82/54 however it responded well to fluids. She requires 3.5 L of oxygen in the emergency department. She will be admitted to a telemetry bed for treatment of right lower lobe pneumonia.  Hospital Course   Community acquired pneumonia  -treated IV Levaquin, transitioned to oral Levaquin on discharge to finish total of 7 days. - SLP evaluation indicate mild risk for aspiration - urine for Legionella antigen and strep pneumo antigen are negative, sputum culture with no growth   Mild COPD exacerbation -Wheezing significantly improved, IV steroids tapered, on oral prednisone.  Hypotension  (82/54). Responded well to IV fluids and BP now stable.  History of COPD and lung cancer status post lobectomy She is normally on 2 L of oxygen at home.   Hypothyroidism Continue Synthroid  Dyslipidemia Continue statin.     Discharge Condition:  stable   Follow UP  Follow-up Information    Follow up with [facility].   Why:  [facility], will call in next 24-48 hours to set up first home visit   Contact information:   [address] [place] [address] [phone]       Schedule an appointment as soon as possible for a visit with Gwendolyn Grant, MD.   Specialty:  Internal Medicine   Why:  Posthospitalization follow-up   Contact information:   [address] [place] [address] [phone]         Discharge Instructions  and  Discharge Medications     Discharge Instructions    Diet - low sodium heart healthy    Complete by:  As directed      Discharge instructions    Complete by:  As directed   Follow with Primary MD Gwendolyn Grant, MD in 7 days   Get CBC, CMP, 2 view Chest X ray checked  by Primary MD next visit.    Activity: As tolerated with Full fall precautions use walker/cane & assistance as needed   Disposition Home    Diet: Heart Healthy ** , with feeding assistance and aspiration precautions.  For Heart failure patients - Check your Weight same time everyday, if you gain over 2 pounds, or you develop in leg swelling, experience more shortness of breath or chest pain, call your Primary MD immediately. Follow Cardiac Low Salt Diet and 1.5 lit/day fluid restriction.   On your next visit with your primary care physician please Get Medicines reviewed and adjusted.   Please request your Prim.MD to go over all Hospital Tests and Procedure/Radiological results at the follow up, please get all Hospital records sent to your Prim MD by signing hospital release before you go home.   If you  experience worsening of your admission symptoms, develop shortness of breath, life threatening emergency, suicidal or homicidal thoughts you must seek medical attention immediately by calling 911 or calling your MD immediately  if symptoms less severe.  You Must read complete instructions/literature  along with all the possible adverse reactions/side effects for all the Medicines you take and that have been prescribed to you. Take any new Medicines after you have completely understood and accpet all the possible adverse reactions/side effects.   Do not drive, operating heavy machinery, perform activities at heights, swimming or participation in water activities or provide baby sitting services if your were admitted for syncope or siezures until you have seen by Primary MD or a Neurologist and advised to do so again.  Do not drive when taking Pain medications.    Do not take more than prescribed Pain, Sleep and Anxiety Medications  Special Instructions: If you have smoked or chewed Tobacco  in the last 2 yrs please stop smoking, stop any regular Alcohol  and or any Recreational drug use.  Wear Seat belts while driving.   Please note  You were cared for by a hospitalist during your hospital stay. If you have any questions about your discharge medications or the care you received while you were in the hospital after you are discharged, you can call the unit and asked to speak with the hospitalist on call if the hospitalist that took care of you is not available. Once you are discharged, your primary care physician will handle any further medical issues. Please note that NO REFILLS for any discharge medications will be authorized once you are discharged, as it is imperative that you return to your primary care physician (or establish a relationship with a primary care physician if you do not have one) for your aftercare needs so that they can reassess your need for medications and monitor your lab  values.     Increase activity slowly    Complete by:  As directed             Medication List    TAKE these medications        acidophilus Caps capsule  Take 1 capsule by mouth daily.     ALPRAZolam 1 MG tablet  Commonly known as:  XANAX  Take 1 tablet (1 mg total) by mouth at bedtime.     aspirin EC 81 MG tablet  Take 81 mg by mouth 2 (two) times a week.     atorvastatin 40 MG tablet  Commonly known as:  LIPITOR  Take 1 tablet (40 mg total) by mouth daily.     beclomethasone 80 MCG/ACT inhaler  Commonly known as:  QVAR  Inhale 2 puffs into the lungs 2 (two) times daily.     CENTRUM SILVER PO  Take 1 tablet by mouth daily.     FLUTTER Devi  Blow through 4 times per cycle and repeat 3 cycles per day     levalbuterol 1.25 MG/3ML nebulizer solution  Commonly known as:  XOPENEX  Take 1.25 mg by nebulization every 8 (eight) hours as needed for wheezing or shortness of breath.     levofloxacin 500 MG tablet  Commonly known as:  LEVAQUIN  Take 1 tablet (500 mg total) by mouth daily.  Start taking on:  12/26/2014     levothyroxine 100 MCG tablet  Commonly known as:  SYNTHROID, LEVOTHROID  Take 1 tablet (100 mcg total) by mouth daily before breakfast.     ondansetron 4 MG tablet  Commonly known as:  ZOFRAN  Take 1 tablet (4 mg total) by mouth every 8 (eight) hours as needed for nausea or vomiting.     predniSONE 10 MG tablet  Commonly known as:  DELTASONE  Please use 4 tablets oral daily 3 days, then 3 tablets oral daily 3 days, then 2 tablets oral daily 3 days , then 1 tablet oral daily 3 days , then stop .     promethazine 25 MG tablet  Commonly known as:  PHENERGAN  Take 1 tablet (25 mg total) by mouth every 6 (six) hours as needed for nausea or vomiting.     TYLENOL ARTHRITIS PAIN 650 MG CR tablet  Generic drug:  acetaminophen  Take 650 mg by mouth as needed for pain.          Diet and Activity recommendation: See Discharge Instructions  above   Consults obtained -  none   Major procedures and Radiology Reports - PLEASE review detailed and final reports for all details, in brief -      Dg Chest 2 View  12/22/2014   CLINICAL DATA:  Coughing up blood last night. Fever. Generalized chest pain with shortness of breath. History of emphysema.  EXAM: CHEST  2 VIEW  COMPARISON:  06/30/2014 and 10/14/2014  FINDINGS: There is chronic volume loss at the right lung base with pleural thickening and known pleural calcifications. There are increased densities in the right lower lung which are concerning for an acute infectious or inflammatory process. There is a stable linear scar at the right lung base. Again noted is a calcified nodule in the right perihilar region. Lucency in the upper lungs are compatible with emphysema. No significant airspace disease in the left lung. Atherosclerotic calcifications involving the aortic arch.  IMPRESSION: Acute on chronic disease at the right lung base. Findings concerning for an acute infectious or inflammatory process in the right lower lung.   Electronically Signed   By: Markus Daft M.D.   On: 12/22/2014 10:47    Micro Results   Results for orders placed or performed during the hospital encounter of 05/27/14  Clostridium Difficile by PCR     Status: None   Collection Time: 05/27/14  6:19 PM  Result Value Ref Range Status   C difficile by pcr NEGATIVE NEGATIVE Final  Stool culture     Status: None   Collection Time: 05/28/14  1:20 AM  Result Value Ref Range Status   Specimen Description STOOL  Final   Special Requests NONE  Final   Culture   Final    NO SALMONELLA, SHIGELLA, CAMPYLOBACTER, YERSINIA, OR E.COLI 0157:[id number] ISOLATED Note: REDUCED NORMAL FLORA PRESENT Performed at [facility]    Report Status 05/31/2014 FINAL  Final     No results found for this or any previous visit (from the past 240 hour(s)).     Today   Subjective:   Shivangi Lutz today has no headache,no  chest abdominal pain,no new weakness tingling or numbness, feels much better wants to go home today.  cough improved, no further green productive phlegm.  Objective:   Blood pressure 151/76, pulse 82, temperature 98.6 F (37 C), temperature source Oral, resp. rate 20, height '5\' 6"'$  (1.676 m), weight 73.029 kg (161 lb), SpO2 98 %.   Intake/Output Summary (Last 24 hours) at 12/25/14 1305 Last data filed at 12/25/14 1132  Gross per 24 hour  Intake    900 ml  Output   2950 ml  Net  -2050 ml    Exam  General: AAOx3  Cardiovascular: S1s2/RRR  Respiratory: air movement significantly improved, wheezing almost gone.  Abdomen: soft, NT, BS present  Musculoskeletal: no edema c/c Data Review   CBC w  Diff: Lab Results  Component Value Date   WBC 12.5* 12/24/2014   WBC 6.7 01/06/2012   HGB 10.8* 12/24/2014   HGB 12.7 01/06/2012   HCT 34.4* 12/24/2014   HCT 38.6 01/06/2012   PLT 202 12/24/2014   PLT 221 01/06/2012   LYMPHOPCT 10* 12/22/2014   LYMPHOPCT 12.4* 01/06/2012   MONOPCT 7 12/22/2014   MONOPCT 3.7 01/06/2012   EOSPCT 0 12/22/2014   EOSPCT 0.2 01/06/2012   BASOPCT 0 12/22/2014   BASOPCT 0.3 01/06/2012    CMP: Lab Results  Component Value Date   NA 139 12/24/2014   NA 138 12/31/2012   NA 143 12/21/2010   K 4.2 12/24/2014   K 4.1 12/31/2012   K 4.0 12/21/2010   CL 105 12/24/2014   CL 97* 12/21/2010   CO2 28 12/24/2014   CO2 29 12/31/2012   CO2 29 12/21/2010   BUN 8 12/24/2014   BUN 8.3 12/31/2012   BUN 10 12/21/2010   CREATININE 0.76 12/24/2014   CREATININE 0.8 12/31/2012   CREATININE 0.9 12/21/2010   PROT 5.9* 05/28/2014   PROT 7.5 12/31/2012   PROT 7.5 12/21/2010   ALBUMIN 2.9* 05/28/2014   ALBUMIN 3.5 12/31/2012   BILITOT 0.3 05/28/2014   BILITOT 0.36 12/31/2012   BILITOT 0.70 12/21/2010   ALKPHOS 62 05/28/2014   ALKPHOS 119 12/31/2012   ALKPHOS 74 12/21/2010   AST 39* 05/28/2014   AST 25 12/31/2012   AST 34 12/21/2010   ALT 19 05/28/2014    ALT 22 12/31/2012   ALT 22 12/21/2010  .   Total Time in preparing paper work, data evaluation and todays exam - 35 minutes  ELGERGAWY, DAWOOD M.D on 12/25/2014 at 1:05 PM  [facility]   Office  [phone]

## 2014-12-25 NOTE — Progress Notes (Signed)
Patient stated her daughter will be coming to pick her up between 4-5PM when she gets out of work.

## 2014-12-25 NOTE — Care Management Note (Signed)
Case Management Note  Patient Details  Name: ZERA MARKWARDT MRN: [id number] Date of Birth: 1932-11-07  Subjective/Objective:                    Action/Plan: Home with ome health  Expected Discharge Date:                  Expected Discharge Plan:  [place]  In-House Referral:     Discharge planning Services  CM Consult  Post Acute Care Choice:  Home Health Choice offered to:  Patient  DME Arranged:    DME Agency:     HH Arranged:  PT [place]:  [facility]  Status of Service:  Completed, signed off  Medicare Important Message Given:  Yes-second notification given Date Medicare IM Given:    Medicare IM give by:    Date Additional Medicare IM Given:    Additional Medicare Important Message give by:     If discussed at [place] of Stay Meetings, dates discussed:    Additional Comments:  Carles Collet, RN 12/25/2014, 12:41 PM

## 2014-12-25 NOTE — Progress Notes (Signed)
Utilization Review completed. Hulda Reddix RN BSN CM 

## 2014-12-25 NOTE — Progress Notes (Signed)
NURSING PROGRESS NOTE  [name] [id number] Discharge Data: 12/25/2014 5:52 PM Attending Provider: Albertine Patricia, MD UNG:[id number] Asa Lente, MD   [name] to be D/C'd Home with husband per MD order via wheelchair by [facility], with hard copies of all prescriptions.     All IV's will be discontinued and monitored for bleeding.  All belongings will be returned to patient for patient to take home.  Last Documented Vital Signs:  Blood pressure 149/89, pulse 94, temperature 98.3 F (36.8 C), temperature source Oral, resp. rate 16, height '5\' 6"'$  (1.676 m), weight 73.029 kg (161 lb), SpO2 98 %.  Hendricks Limes RN, BS, BSN

## 2014-12-25 NOTE — Discharge Instructions (Signed)
Follow with Primary MD Gwendolyn Grant, MD in 7 days   Get CBC, CMP, 2 view Chest X ray checked  by Primary MD next visit.    Activity: As tolerated with Full fall precautions use walker/cane & assistance as needed   Disposition Home    Diet: Heart Healthy  , with feeding assistance and aspiration precautions.  For Heart failure patients - Check your Weight same time everyday, if you gain over 2 pounds, or you develop in leg swelling, experience more shortness of breath or chest pain, call your Primary MD immediately. Follow Cardiac Low Salt Diet and 1.5 lit/day fluid restriction.   On your next visit with your primary care physician please Get Medicines reviewed and adjusted.   Please request your Prim.MD to go over all Hospital Tests and Procedure/Radiological results at the follow up, please get all Hospital records sent to your Prim MD by signing hospital release before you go home.   If you experience worsening of your admission symptoms, develop shortness of breath, life threatening emergency, suicidal or homicidal thoughts you must seek medical attention immediately by calling 911 or calling your MD immediately  if symptoms less severe.  You Must read complete instructions/literature along with all the possible adverse reactions/side effects for all the Medicines you take and that have been prescribed to you. Take any new Medicines after you have completely understood and accpet all the possible adverse reactions/side effects.   Do not drive, operating heavy machinery, perform activities at heights, swimming or participation in water activities or provide baby sitting services if your were admitted for syncope or siezures until you have seen by Primary MD or a Neurologist and advised to do so again.  Do not drive when taking Pain medications.    Do not take more than prescribed Pain, Sleep and Anxiety Medications  Special Instructions: If you have smoked or chewed Tobacco  in  the last 2 yrs please stop smoking, stop any regular Alcohol  and or any Recreational drug use.  Wear Seat belts while driving.   Please note  You were cared for by a hospitalist during your hospital stay. If you have any questions about your discharge medications or the care you received while you were in the hospital after you are discharged, you can call the unit and asked to speak with the hospitalist on call if the hospitalist that took care of you is not available. Once you are discharged, your primary care physician will handle any further medical issues. Please note that NO REFILLS for any discharge medications will be authorized once you are discharged, as it is imperative that you return to your primary care physician (or establish a relationship with a primary care physician if you do not have one) for your aftercare needs so that they can reassess your need for medications and monitor your lab values.

## 2014-12-26 ENCOUNTER — Telehealth: Payer: Self-pay

## 2014-12-26 NOTE — Telephone Encounter (Signed)
Pt is on TCM list.   LVM for pt to call back as soon as possible.  RE: TCM questions and appt with provider in the office needed.

## 2014-12-29 NOTE — Telephone Encounter (Signed)
Patient has called you back. She does have a follow up appointment on Weds.  If you have anything else to talk to her about please call her at [phone]

## 2014-12-29 NOTE — Telephone Encounter (Signed)
Patient has TCM appt for 12/31/2014 with Dr. Linna Darner at 3:15 pm.  Discharged 12/25/14 after admission due to CAP.   Transition Care Management Follow-up Telephone Call  Date discharged? 12/25/14  How have you been since you were released from the hospital? Patient is feeling much better since discharge.   Do you understand why you were in the hospital? Yes  Do you understand the discharge instrcutions? Yes  Where were you discharged to? Home  Items Reviewed:  Medications reviewed: Yes  Allergies reviewed: Yes  Dietary changes reviewed: Yes  Referrals reviewed: Yes  Functional Questionnaire:   Activities of Daily Living (ADLs):   States they are independent in the following: All ADLs States they require assistance with the following: No ADLs -  There was some dizziness at first with the prednisone dose (4 tabs daily for 3 days) and abx dose.  Any transportation issues/concerns?: No concerns but would like to have dtr take her due to husband currently having an appointment at the same time.   Any patient concerns? Patient does not have any concerns currently.   Confirmed importance and date/time of follow-up visits scheduled: Patient confirmed understanding.   Provider Appointment booked with: Dr. Linna Darner  Confirmed with patient if condition begins to worsen call PCP or go to the ER.  Patient states understanding.  Patient was given the office number and encouraged to call back with question or concerns: Yes, patient stated understanding and will call with questions or concerns.

## 2014-12-31 ENCOUNTER — Encounter: Payer: Self-pay | Admitting: Internal Medicine

## 2014-12-31 ENCOUNTER — Ambulatory Visit (INDEPENDENT_AMBULATORY_CARE_PROVIDER_SITE_OTHER): Payer: Medicare Other | Admitting: Internal Medicine

## 2014-12-31 VITALS — BP 130/62 | HR 81 | Temp 98.1°F | Resp 18 | Wt 160.0 lb

## 2014-12-31 DIAGNOSIS — J189 Pneumonia, unspecified organism: Secondary | ICD-10-CM

## 2014-12-31 DIAGNOSIS — T887XXA Unspecified adverse effect of drug or medicament, initial encounter: Secondary | ICD-10-CM

## 2014-12-31 DIAGNOSIS — T50905A Adverse effect of unspecified drugs, medicaments and biological substances, initial encounter: Secondary | ICD-10-CM

## 2014-12-31 DIAGNOSIS — J449 Chronic obstructive pulmonary disease, unspecified: Secondary | ICD-10-CM

## 2014-12-31 NOTE — Progress Notes (Signed)
Pre visit review using our clinic review tool, if applicable. No additional management support is needed unless otherwise documented below in the visit note. 

## 2014-12-31 NOTE — Progress Notes (Signed)
   Subjective:    Patient ID: [name], female    DOB: March 28, 1933, 81 y.o.   MRN: [id number]  HPI She was hospitalized 7/4-12/25/14 with community acquired pneumonia. She was treated with steroids and Levaquin. Her initial symptoms were green sputum with streaky hemoptysis  upon awakening 7/4. She had associated fever and chills with temperature up to 100.5. Blood pressure was 82/54 and degree of hypoxemia was increased over her baseline. Hypotension responded to IV fluids.  Swallowing evaluation suggested mild risk for aspiration. She was discharged on oral steroids which disturb her sleep. She was Rxed a flutter valve which she's been employing. She continues to have some dyspnea but is basically @ her baseline.  She is on O2 at 2 L if her O2 sats are less than 90% or with increased activity. Also she uses O2 @ night.  She continues Xopenex 3 times a day Qvar twice a day   Review of Systems At this time she has a cough with white sputum. She has occasional wheezing. She has no fever or chills.  Chest pain, palpitations, tachycardia, paroxysmal nocturnal dyspnea, claudication or edema are absent.      Objective:   Physical Exam Pertinent or positive findings include:  She does exhibit some use of accessory muscles. She exhibits a slight tachypnea. She has some abnormal breath sounds on the right suggesting pleural etiology. The first heart sound is accentuated. She has no neck vein distention at 10. There is no hepatojugular reflux. She has no edema or cyanosis.   General appearance :adequately nourished; in no distress.  Eyes: No conjunctival inflammation or scleral icterus is present.  Oral exam:  Lips and gums are healthy appearing.There is no oropharyngeal erythema or exudate noted. Dental hygiene is good.  Heart:  Normal rate and regular rhythm. S1 and S2 normal without gallop, murmur, click, rub or other extra sounds     Abdomen: bowel sounds normal, soft and  non-tender without masses, organomegaly or hernias noted.  No guarding or rebound.   Vascular : all pulses equal ; no bruits present.  Skin:Warm & dry.  Intact without suspicious lesions or rashes ; no tenting or jaundice   Lymphatic: No lymphadenopathy is noted about the head, neck, axilla   Neuro: Strength, tone decreased        Assessment & Plan:  #1 status post community acquired pneumonia.  #2 advanced COPD. She appears back to her baseline.  #3 sleep disorder exacerbated by spheroids orally.  See after visit summary.

## 2014-12-31 NOTE — Patient Instructions (Addendum)
To prevent sleep dysfunction follow these instructions for sleep hygiene. Do not read, watch TV, or eat in bed. Do not get into bed until you are ready to turn off the light &  to go to sleep. Do not ingest stimulants ( decongestants, diet pills, nicotine, caffeine) after the evening meal.Do not take daytime naps.Cardiovascular exercise, as walking as tolerated on oxygen is recommended .  Please continue the Xopenex twice a day followed at least 30 minutes later by the Qvar. Xopenex can be used a third time midday if needed.

## 2015-01-07 DIAGNOSIS — J440 Chronic obstructive pulmonary disease with acute lower respiratory infection: Secondary | ICD-10-CM

## 2015-01-13 ENCOUNTER — Telehealth: Payer: Self-pay | Admitting: Internal Medicine

## 2015-01-13 NOTE — Telephone Encounter (Signed)
Please advise 

## 2015-01-13 NOTE — Telephone Encounter (Signed)
Would advise checking with [facility] as he approved order for wheelchair - Perhaps he could addend his note from July 13 to include necessary documentation of need for this specific DME? Otherwise, agree may need office visit to address documentation requirements for authorization of ordered wheelchair. Thanks

## 2015-01-13 NOTE — Telephone Encounter (Signed)
Beth from [facility] called regarding the order that Dr. Linna Darner approved yesterday for the transport wheelchair and they are needing the office visit notes for that. She can be reached at [phone] Notes can be fax to  [phone]

## 2015-01-13 NOTE — Telephone Encounter (Signed)
Pt saw Hopp on 12/31/14 and last saw you on 11/04/14. [facility] is needing office notes stating that the pt has trouble ambulating long distances. I do not see anything from her last OV with Linna Darner that would justify this. Please advise if we need to get pt in for office visit to determine this to send to [facility].

## 2015-01-13 NOTE — Telephone Encounter (Signed)
I saw the patient following hospitalization for community-acquired pneumonia. There is no documentation for wheelchair. She did have exertional dyspnea related to her COPD. I cannot amend that post hosp visit concerning this as this would be fraud. If she needs a wheelchair; information is needed from the home health services and she will need an office visit with one of the providers. This can wait until [facility] and Dr Asa Lente back. [employer]

## 2015-01-14 ENCOUNTER — Telehealth: Payer: Self-pay | Admitting: Internal Medicine

## 2015-01-14 DIAGNOSIS — J449 Chronic obstructive pulmonary disease, unspecified: Secondary | ICD-10-CM

## 2015-01-14 NOTE — Telephone Encounter (Signed)
Pt needs an appt asap with any provider

## 2015-01-14 NOTE — Telephone Encounter (Signed)
I called spoke with pt. She is wanting to know if Dr. Annamaria Boots will order her a wheelchair ( not motorized or electric) through [facility] for when she has to go to the grocery store or places like this that does not have wheelchairs.  She called PCP for this but was told they needed to see her first.  Please advise Dr. Annamaria Boots thanks

## 2015-01-14 NOTE — Telephone Encounter (Signed)
Patient does not want to make an appointment to come in.  States she already had an appointment.  I did explain to her the reason she does need to come in.  Patient states she is going to try her pulmonologist to see if he will order.

## 2015-01-14 NOTE — Telephone Encounter (Signed)
LMOVM to call us back

## 2015-01-15 NOTE — Telephone Encounter (Signed)
[facility] order standard adult wheelchair with foot rests and O2 carrier for dx COPD mixed type

## 2015-01-15 NOTE — Telephone Encounter (Signed)
Spoke with pt, she is aware of order being placed. Wheelchair ordered.  Nothing further needed.

## 2015-01-16 ENCOUNTER — Telehealth: Payer: Self-pay | Admitting: Internal Medicine

## 2015-01-16 NOTE — Telephone Encounter (Signed)
Spoke with the pt and notified of recs per CDY  She verbalized understanding  She will try for ov with [name] first and call for ov here if not able to see hopper soon

## 2015-01-16 NOTE — Telephone Encounter (Signed)
She just was looking for a way to get wheelchair without an office visit. Please tell her this is a Medicare requirement. She needs to go ahead and get an ov with Dr Linna Darner or me, whichever is sooner.

## 2015-01-16 NOTE — Telephone Encounter (Signed)
Spoke with the [place] with [facility]  She states that they received order for wheelchair, but due to medicare guidelines she needs to have face to face visit within 90 days prior to the order  CDY please advise if you are okay with scheduling her for visit to discuss  Looks like pt wanted CDY to write order since Dr Linna Darner would not  Please advise thanks

## 2015-01-16 NOTE — Telephone Encounter (Signed)
lmtcb X1 for Melissa.  

## 2015-01-21 ENCOUNTER — Encounter: Payer: Self-pay | Admitting: Internal Medicine

## 2015-01-21 ENCOUNTER — Telehealth: Payer: Self-pay | Admitting: *Deleted

## 2015-01-21 ENCOUNTER — Ambulatory Visit (INDEPENDENT_AMBULATORY_CARE_PROVIDER_SITE_OTHER): Payer: Medicare Other | Admitting: Internal Medicine

## 2015-01-21 VITALS — BP 142/68 | HR 82 | Temp 98.0°F | Resp 20 | Wt 157.0 lb

## 2015-01-21 DIAGNOSIS — J449 Chronic obstructive pulmonary disease, unspecified: Secondary | ICD-10-CM

## 2015-01-21 DIAGNOSIS — R0609 Other forms of dyspnea: Secondary | ICD-10-CM

## 2015-01-21 DIAGNOSIS — R0902 Hypoxemia: Secondary | ICD-10-CM

## 2015-01-21 NOTE — Telephone Encounter (Signed)
Received call pt states she saw Dr. Linna Darner  About an hour ago. Has ? About a medication that is on her list "Acidolphilus". Inform pt that was rx to her whilw shw was seen at Er only for 7 days. Med is a probiotic. Pt states she didn't know what the med was. Inform pt will remove off med list.../lmb

## 2015-01-21 NOTE — Progress Notes (Signed)
   Subjective:    Patient ID: [name], female    DOB: April 28, 1933, 81 y.o.   MRN: [id number]  HPI She is having difficulty regaining her strength following the hospitalization for pneumonia. She has been ambulating with a cane over the last 2 weeks. The home health Physical Therapist has recommended a wheelchair.  She now gets out of breath with associated desaturations when she cooks dinner. Previously she was on O2 only at night but she has been using it more during the day.  Her cough is productive of clear/white sputum. She will have paroxysms of cough lasting up to an hour. She has occasional wheeze which typically precedes the cough.   She's been using her nebulizer twice a day & Qvar twice a day. She also has a incentive spirometer and flutter valve which she employs 2-3 times per day.  Labs done during her hospitalization for community-acquired pneumonia 12/24/14 revealed a glucose of 148, hemoglobin 10.8, and hematocrit of 34.4. Her hemoglobin had been 11.9 on 7/4.  Review of Systems    She denies purulent sputum or hemoptysis. She's not having fever, chills, or sweats.     Objective:   Physical Exam  Pertinent or positive findings include: She has slight clubbing of the nailbeds without cyanosis. She has isolated DIP osteoarthritic changes. Dorsalis pedis pulses are decreased. Her feet are cool. After walking 280 feet oxygen saturation was 88% and she was short of breath subjectively with audible wheezing.   General appearance :adequately nourished; in no distress.  Eyes: No conjunctival inflammation or scleral icterus is present.  Oral exam:  Lips and gums are healthy appearing.There is no oropharyngeal erythema or exudate noted. Dental hygiene is good.  Heart:  Normal rate and regular rhythm. S1 and S2 normal without gallop, murmur, click, rub or other extra sounds    Lungs:Chest clear to auscultation; no wheezes, rhonchi,rales ,or rubs present..   Abdomen: bowel  sounds normal, soft and non-tender without masses, organomegaly or hernias noted.  No guarding or rebound. .  Vascular : all pulses equal ; no bruits present.  Skin:Warm & dry.  Intact without suspicious lesions or rashes ; no tenting or jaundice   Lymphatic: No lymphadenopathy is noted about the head, neck, axilla.   Neuro: Strength, tone decreased         Assessment & Plan:  #1 advanced COPD, oxygen dependent  #2 exertional dyspnea with oxygen desaturation  Plan: She was asked to wear the oxygen when physically active as well as at night. Light weight wheelchair will be requested.

## 2015-01-21 NOTE — Progress Notes (Signed)
Pre visit review using our clinic review tool, if applicable. No additional management support is needed unless otherwise documented below in the visit note. 

## 2015-01-21 NOTE — Patient Instructions (Signed)
Please employ the oxygen when physically active to prevent oxygen desaturation. This could have significant cardiac risk.

## 2015-01-28 ENCOUNTER — Telehealth: Payer: Self-pay | Admitting: Emergency Medicine

## 2015-01-28 NOTE — Telephone Encounter (Signed)
Spoke with [facility] and sent over RX for wheelchair and last OV notes. Rep stated that there is a team currently working on the wheelchair order. LVM for pt to call back to inform her.

## 2015-01-29 ENCOUNTER — Telehealth: Payer: Self-pay | Admitting: Emergency Medicine

## 2015-01-29 NOTE — Telephone Encounter (Signed)
Pt returned call from yesterday. Informed her that [facility] is in the process of getting her wheelchair order placed.

## 2015-02-02 ENCOUNTER — Telehealth: Payer: Self-pay | Admitting: Emergency Medicine

## 2015-02-02 NOTE — Telephone Encounter (Signed)
Spoke with [facility]. Rep stated that they orders needed to state whether it was a light weight wheel chair or standard, and reasons why it needed to be light weight. They will be sending new orders in and I informed them that Linus Orn will be out of the office all week and we would get them sent back in once he returned.

## 2015-02-17 ENCOUNTER — Other Ambulatory Visit: Payer: Self-pay | Admitting: Internal Medicine

## 2015-02-18 IMAGING — CR DG CHEST 2V
2 series · 2 of 2 positions shown · non-contrast
Comparison: 12/31/2012 and 12/07/2010

CLINICAL DATA: Heart racing and shortness of breath

EXAM:
CHEST  2 VIEW

[view not recorded (1 of 2)]
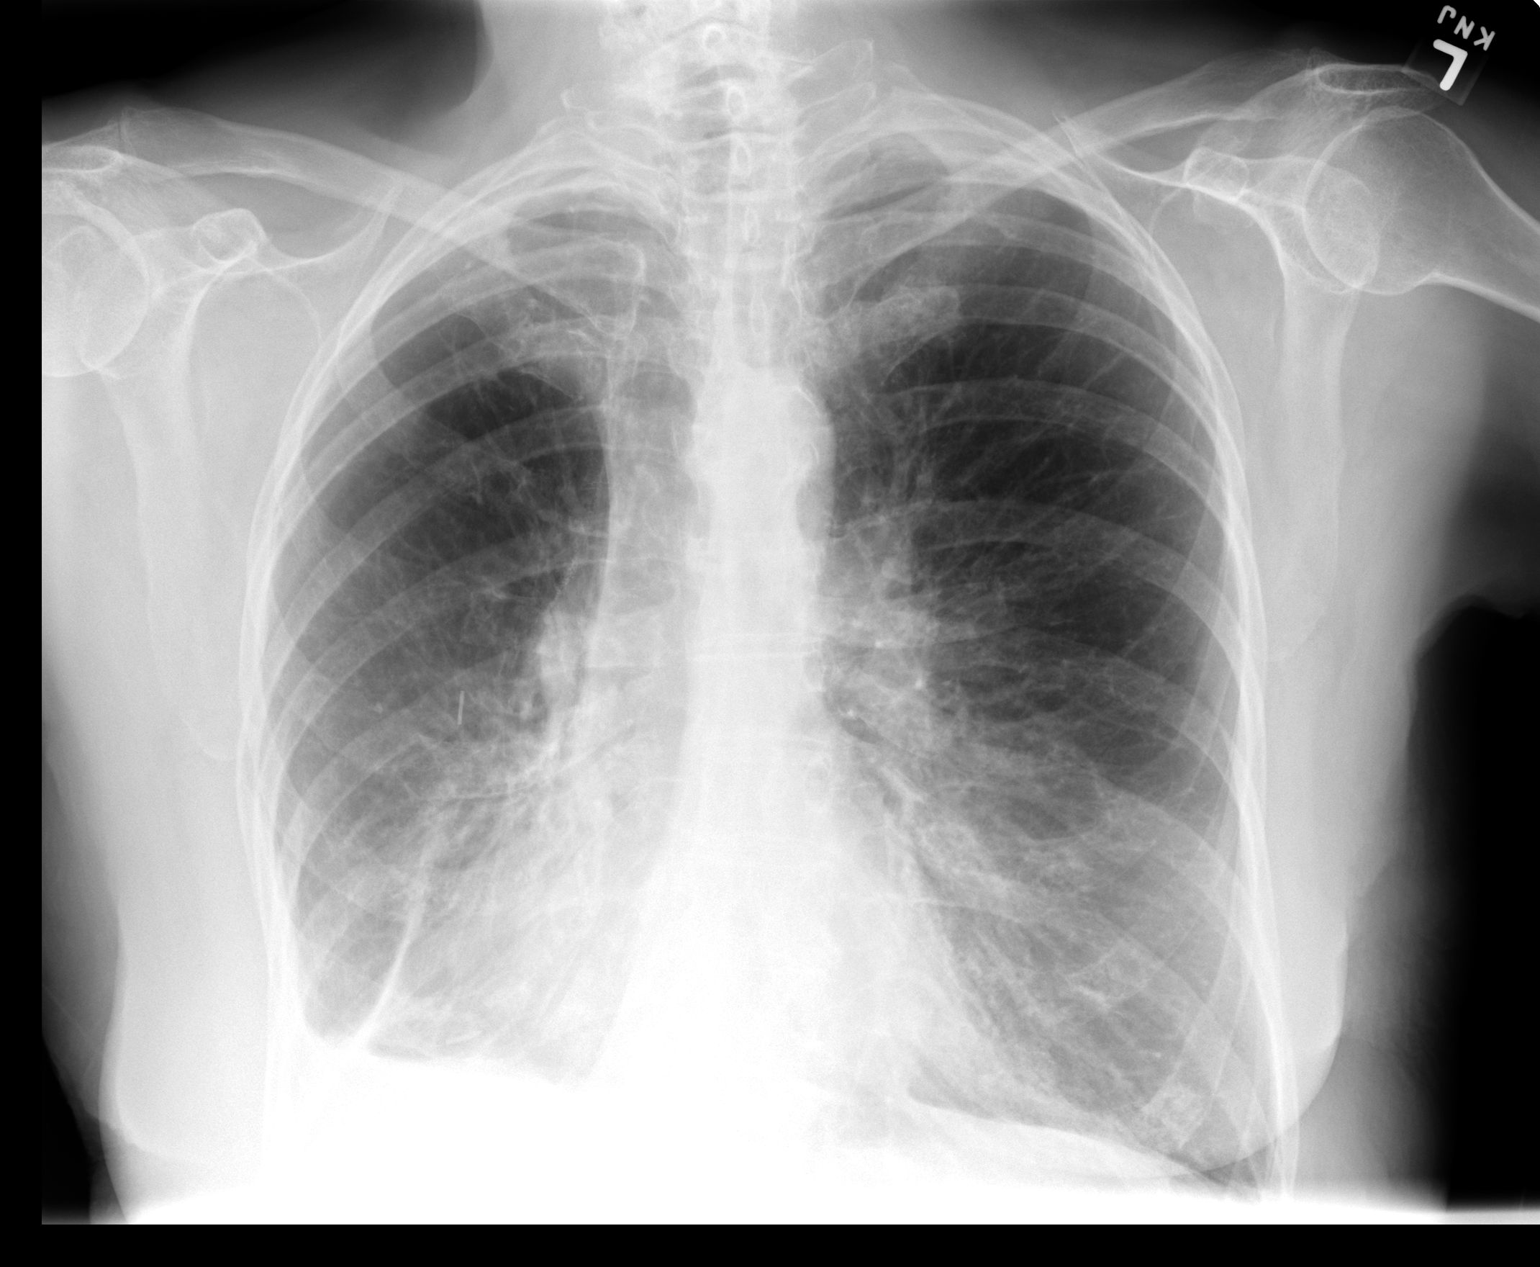

[view not recorded (2 of 2)]
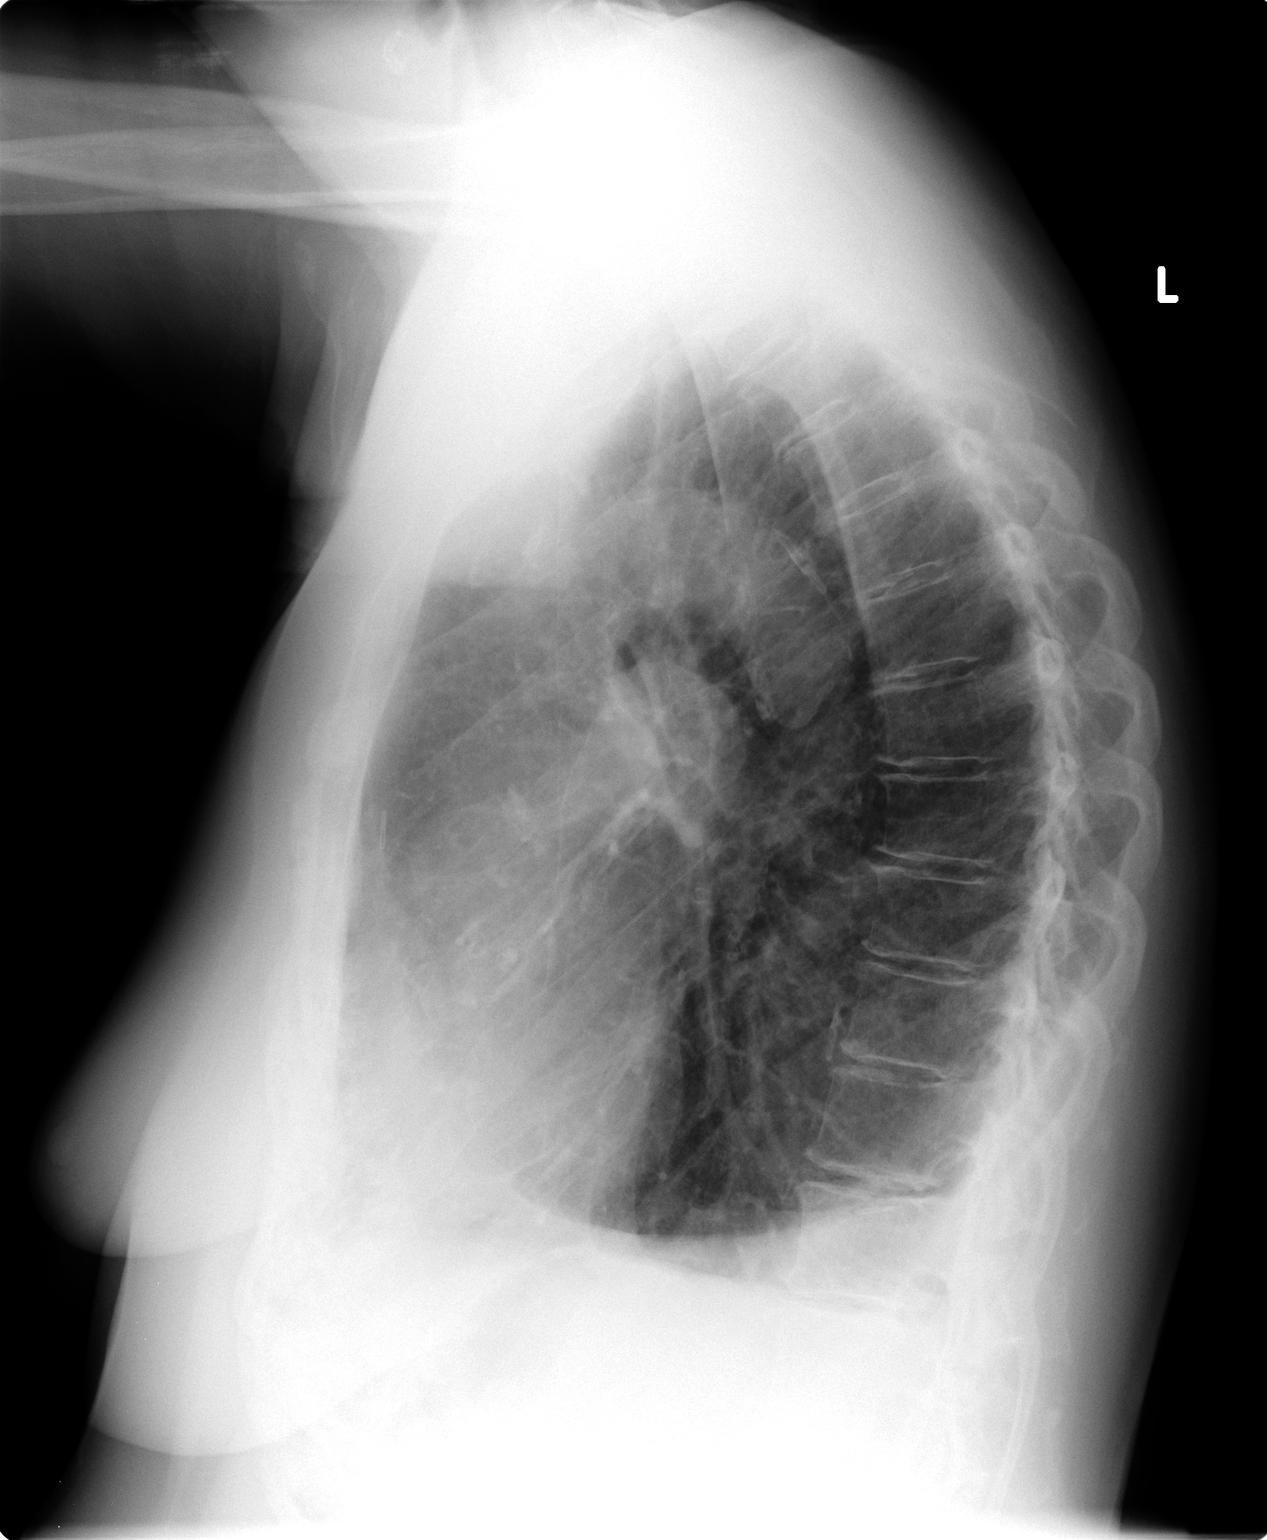

[2 of 2 positions shown; findings below may reference images not displayed]

FINDINGS: There is chronic volume loss and pleural thickening at the right
lung base. Findings are related to postoperative changes. No
evidence for airspace disease or edema. Heart and mediastinum are
stable. Stable lucency in the upper lungs consistent with emphysema.
Bony thorax is intact.
IMPRESSION: Chronic volume loss and pleural thickening in the right hemithorax.
No acute chest findings.

## 2015-02-19 NOTE — Telephone Encounter (Signed)
See note below:  [phone]

## 2015-02-19 NOTE — Telephone Encounter (Signed)
Beth from [facility] is needing some notes stating why she can't ambulate long distances.  Please call her at

## 2015-02-20 NOTE — Telephone Encounter (Signed)
Spoke with [place] from [facility]. She will be sending over the required information for the pt for the wheelchair.

## 2015-02-26 NOTE — Progress Notes (Signed)
   Subjective:    Patient ID: [name], female    DOB: Jun 06, 1933, 81 y.o.   MRN: [id number]  HPI    Review of Systems     Objective:   Physical Exam        Assessment & Plan:  This patient is unable to ambulate any significant distance outside the home due to her advanced, end-stage emphysema/chronic obstructive pulmonary disease which is oxygen dependent. With ambulation she exhibits oxygen  desaturation. She is oxygen dependent essentially 24 hours a day. A wheelchair which could be propelled by a family member or attendant is medically indicated based on these facts.

## 2015-03-05 ENCOUNTER — Ambulatory Visit (INDEPENDENT_AMBULATORY_CARE_PROVIDER_SITE_OTHER): Payer: Medicare Other | Admitting: Internal Medicine

## 2015-03-05 ENCOUNTER — Other Ambulatory Visit (INDEPENDENT_AMBULATORY_CARE_PROVIDER_SITE_OTHER): Payer: Medicare Other

## 2015-03-05 ENCOUNTER — Encounter: Payer: Self-pay | Admitting: Internal Medicine

## 2015-03-05 ENCOUNTER — Ambulatory Visit (INDEPENDENT_AMBULATORY_CARE_PROVIDER_SITE_OTHER)
Admission: RE | Admit: 2015-03-05 | Discharge: 2015-03-05 | Disposition: A | Discharge status: Home / Self Care | Payer: Medicare Other | Source: Ambulatory Visit | Attending: Internal Medicine | Admitting: Internal Medicine

## 2015-03-05 VITALS — BP 124/64 | HR 79 | Ht 66.0 in | Wt 156.8 lb

## 2015-03-05 DIAGNOSIS — J9611 Chronic respiratory failure with hypoxia: Secondary | ICD-10-CM

## 2015-03-05 DIAGNOSIS — C3490 Malignant neoplasm of unspecified part of unspecified bronchus or lung: Secondary | ICD-10-CM | POA: Diagnosis not present

## 2015-03-05 DIAGNOSIS — J42 Unspecified chronic bronchitis: Secondary | ICD-10-CM

## 2015-03-05 DIAGNOSIS — J209 Acute bronchitis, unspecified: Secondary | ICD-10-CM

## 2015-03-05 DIAGNOSIS — J449 Chronic obstructive pulmonary disease, unspecified: Secondary | ICD-10-CM | POA: Diagnosis not present

## 2015-03-05 LAB — CBC WITH DIFFERENTIAL/PLATELET
Basophils Absolute: 0 10*3/uL (ref 0.0–0.1)
Basophils Relative: 0.5 % (ref 0.0–3.0)
EOS PCT: 1 % (ref 0.0–5.0)
Eosinophils Absolute: 0.1 10*3/uL (ref 0.0–0.7)
HEMATOCRIT: 37.8 % (ref 36.0–46.0)
Hemoglobin: 12.4 g/dL (ref 12.0–15.0)
LYMPHS ABS: 1.6 10*3/uL (ref 0.7–4.0)
Lymphocytes Relative: 22.8 % (ref 12.0–46.0)
MCHC: 32.8 g/dL (ref 30.0–36.0)
MCV: 94.6 fl (ref 78.0–100.0)
MONOS PCT: 8.1 % (ref 3.0–12.0)
Monocytes Absolute: 0.6 10*3/uL (ref 0.1–1.0)
NEUTROS ABS: 4.8 10*3/uL (ref 1.4–7.7)
NEUTROS PCT: 67.6 % (ref 43.0–77.0)
PLATELETS: 266 10*3/uL (ref 150.0–400.0)
RBC: 4 Mil/uL (ref 3.87–5.11)
RDW: 13.6 % (ref 11.5–15.5)
WBC: 7 10*3/uL (ref 4.0–10.5)

## 2015-03-05 MED ORDER — BECLOMETHASONE DIPROPIONATE 80 MCG/ACT IN AERS: 2.0000 | INHALATION_SPRAY | Freq: Two times a day (BID) | RESPIRATORY_TRACT | Status: DC

## 2015-03-05 MED ORDER — METHYLPREDNISOLONE ACETATE 80 MG/ML IJ SUSP
80.0000 mg | Freq: Once | INTRAMUSCULAR | Status: AC
  Administered 2015-03-05: 80 mg via INTRAMUSCULAR

## 2015-03-05 NOTE — Progress Notes (Signed)
Patient ID: [name], female    DOB: 1932/07/18, 81 y.o.   MRN: [id number]  HPI 816/12-79 year old female former smoker followed for history of right upper lobe cancer, COPD, allergic rhinitis  Here with husband Last here -August 05, 2010- note reviewed Increased  coughing 2 weeks, but struggling all summer. Better at beach. Here few days ago to see Dr Ronnald Ramp.   Had significantly productive cough- purulent, sometimes  with black spots. Uses nebulizer 2-3x/day, O2 2l/ [employer]. On Avelox now with Symbicort from Dr Ronnald Ramp. Mouth is getting sore. Still actively wheezing and short of breath.  CXR December 07, 2010- clear except old surgical changes.  She is not sure if she began this flare with cold or allergy. Low fever once or twice in past month. CT w/cm- 12/21/10- normal heart size coronary calcification. Prior right upper lobectomy. Stable right millimeter nodule at right minor fissure. No suspicious nodules. Centrilobular emphysema.  05/24/11- 81 year old female former smoker followed for history of right upper lobe cancer, COPD, allergic rhinitis. Here with husband Has had flu shot. She was feeling well until one week ago when she developed sore throat, temperature to 99.7, chills and malaise, cough productive of purulent blood-streaked sputum. Has home nebulizer being used only once daily with Xopenex. She denies swollen glands, fluid retention, GI upset or chest pain.  06/28/11- 81 year old female former smoker followed for history of right upper lobe cancer, COPD, allergic rhinitis. Here with husband Reports- wheezing, chest congestion, runny nose, cough with green/red mucus tinged with blood.  She got well after last visit and been sick again. Did have flu vaccine. Now 3 or 4 days of rhinorrhea, chills, productive cough with discolored sputum. 3 days ago had brief episode of pink in sputum with hard cough and. Cough and sputum production are less today. Occasional tussive soreness  at the xiphoid. Frequent heartburn. Doxycycline and seemed to help but is completed now. Does have home nebulizer.  Had chest x-ray January 7: *RADIOLOGY REPORT* 06/27/11- Clinical Data: 81 year old with hemoptysis.  CHEST - 2 VIEW  Comparison: Chest radiograph 05/24/2011  Findings: Chronic volume loss and postoperative changes in the  right hemithorax. The upper lungs are clear with increased  lucency. Findings suggest emphysematous changes. Stable linear  density at the right lung base is suggestive for scar. The heart  and mediastinum are stable. Slightly increased interstitial  densities in the right mid chest are similar to the prior  examination. No focal airspace disease.  IMPRESSION:  Stable chest radiograph findings. No acute changes.  Original Report Authenticated By: Markus Daft, M.D.   10/13/11- 81 year old female former smoker followed for history of right upper lobe cancer, COPD, allergic rhinitis. Here with husband Always has SOB, wheezing, cough, and congestion-has gotten worse due to pollen-eyes bothering her and runny nose(sneezing) Increased congestion in head and nose. She had had a pneumonia in December and then had fallen at home in January, bruising her face. Using nebulizer twice a day. Complains portable oxygen is too heavy and is [facility]  02/16/12- 81 year old female former smoker followed for history of right upper lobe NSCCa, COPD, allergic rhinitis. Here with husband Sore throat and tongue-? white spots started 2-3 days ago; last night had cough-productive. Recent cortisone shot by orthopedist. COPD assessment test (CAT) score 13/24 CXR 01/06/12-reviewed with her IMPRESSION:  Stable emphysematous change and chronic postsurgical change of the  right lung without definite acute cardiopulmonary disease.  Original Report Authenticated By:  Rachel Moulds, M.D.   10/15/12- 53year-old female former smoker followed for history of  right upper lobe NSCCa, COPD, allergic rhinitis.  FOLLOWS DEY:CXKGYJEHU wheezing today; ? pollen causing it. Did well through the winter. Using oxygen 2 L/[facility]. Productive cough after being outdoors a lot yesterday. Clear sputum, no fever.  03/04/13- 15year-old female former smoker followed for history of right upper lobe Squamous Cell, COPD, allergic rhinitis FOLLOWS FOR:  Increased SOB and coughing w/ yellowish mucus since 9/11 Trying to help husband who has NHL. She reports one-week increased cough, green sputum. We sent doxycycline-sputum turning yellow. No fever or GI upset. Nasal congestion without headache. 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 Postoperative changes at the right lung base.  2. No acute cardiopulmonary disease or evidence for recurrent  disease.  Original Report Authenticated By: San Morelle, M.D.  04/16/13- 87year-old female former smoker followed for history of right upper lobe NSCCa, COPD, allergic rhinitis Follows for- Pt states her problems are improving.  Pt c/o mild prod cough with yellowish sputum with activity. Less cough, some nasal congestion. Less dyspnea on exertion. 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 HR and BP were high. Did not go to ER.   patient describes cough, white sputum, blowing white from nose, headache, raw throat. Denies fever or myalgias except feels sore across front of chest from coughing. No GI  upset. CXR 12/31/12 IMPRESSION:  1. Postoperative changes at the right lung base.  2. No acute cardiopulmonary disease or evidence for recurrent  disease.  Original Report Authenticated By: San Morelle, M.D.  10/15/13- 30year-old female former smoker followed for history of right upper lobe NSCCa, COPD, allergic rhinitis, complicated by glaucoma   Family here    Husband has cancer FOLLOWS FOR:  Sinus and chest congestion with cough white mucus and sneezing x3 days Exacerbation cleared well with Avelox and that worried. Had been well until 3 days ago now-has sneezing, watery nose blamed on pollen. Damp weather increases cough and head congestion. Baseline is daily chronic productive cough with white sputum in the mornings. CXR 07/29/13 IMPRESSION:  Chronic volume loss and pleural thickening in the right hemithorax.  No acute chest findings.  Electronically Signed  By: Markus Daft M.D.  On: 07/26/2013 17:49  04/01/14- 50year-old female former smoker followed for history of right upper lobe NSCCa, COPD, allergic rhinitis, complicated by glaucoma    Husband here, has cancer ACUTE VISIT: cough-productive-pinkish/red in color; recently at ER, CP and upper back pain that went down her arm. SOB as well. D-dimer, CBC, troponin and BMET were unremarkable Pt describes chest congestion starting 4-5 days ago, then sore L scapula x 2 days, maybe low grade fever and chilly one day. Woke with pain leftr upper arm 2 days ago. Went to ER. Taking capsules of otc mucus thinner with red color- blames that for pink sputum and mild queasy. Some occasional wheeze. Tussive soreness R lower anterior costal margin. Achey all over "like flu". Has not had flu shot yet. Shoulder pain is  gone.  CXR 03/29/14 IMPRESSION:  Stable scarring on right with volume loss. Underlying emphysema. No  edema or consolidation. No appreciable change compared to prior  study.    06/30/2014 acute  ov/[facility] re: cough/ fever/ brown sputum   Chief Complaint  Patient presents with  . Acute Visit    Pt c/o increased cough for the past 2 days- prod with dark brown/red sputum.  She also c/o HA, body aches, chills and low grade temp.   acutely since 06/29/14 Usually use neb just once a day but increased to three times daily > comfortable at rest p neb  Has flutter valve  But not using   CXR PA and Lateral:   06/30/2014 :  1. Diffuse infiltrates with nodularity in right lung again noted. Findings most likely infectious including possible granulomatous infection . Associated small right pleural effusion.  03/05/15- 54year-old female former smoker followed for history of right upper lobe resection NSCCa, COPD, allergic rhinitis, complicated by glaucoma    Husband here, has cancer Follows For: pt states shes been having alot of congestion. pt hasd pnuemonia July 4th pt was admitted at [facility] for 4 days.  pt has had to use oxygen most of the time day and night. pt using 2LPN when using O2. pt c/o productive cough white in color. pt had xrays at [facility]. We reviewed chest x-ray showing old postoperative changes on the right with more recent pneumonia.  Using oxygen 2 L for sleep and when necessary/[facility]. Has Flutter device. Cough stays productive, usually white. No blood. Got flu vaccine from primary physician. Gives history of intolerance to oral antibiotics because of GI upset so she needs to get intravenous antibiotics if treated.    CXR 12/22/14 IMPRESSION: Acute on chronic disease at the right lung base. Findings concerning for an acute infectious or inflammatory process in the right lower lung. Electronically Signed  By: Markus Daft M.D.  On: 12/22/2014 10:47    ROS-see HPI   Negative unless "+" Constitutional:    weight loss, night sweats, fevers, chills, fatigue, lassitude. HEENT:    headaches, difficulty swallowing, tooth/dental problems, sore throat,       sneezing, itching, ear ache, nasal congestion, post  nasal drip, snoring CV:    chest pain, orthopnea, PND, swelling in lower extremities, anasarca,                                                        dizziness, palpitations Resp:   + shortness of breath with exertion or at rest.               + productive cough,   non-productive cough, coughing up of blood.              change in color of mucus.  wheezing.   Skin:    rash or lesions. GI:  No-   heartburn, indigestion, abdominal pain, nausea, vomiting,  GU:  MS:   joint pain, stiffness, . Neuro-     nothing unusual Psych:  change in mood or affect.  depression or anxiety.   memory loss.   Objective:   OBJ- Physical Exam General- Alert, Oriented, Affect-appropriate, Distress- none acute,                  saturation 94% room air at  rest (left oxygen in waiting room) Skin- rash-none, lesions- none, excoriation- none Lymphadenopathy- none Head- atraumatic            Eyes- Gross vision intact, PERRLA, conjunctivae and secretions clear            Ears- Hearing, canals-normal            Nose- Clear, no-Septal dev, mucus, polyps, erosion, perforation             Throat- Mallampati II , mucosa clear , drainage- none, tonsils- atrophic Neck- flexible , trachea midline, no stridor , thyroid nl, carotid no bruit Chest - symmetrical excursion , unlabored           Heart/CV- RRR , no murmur , no gallop  , no rub, nl s1 s2                           - JVD- none , edema- none, stasis changes- none, varices- none           Lung- clear to P&A, wheeze- none, cough- none , dullness + right base, rub- none           Chest wall-  Abd-  Br/ Gen/ Rectal- Not done, not indicated Extrem- cyanosis- none, clubbing, none, atrophy- none, strength- nl Neuro- grossly intact to observation

## 2015-03-05 NOTE — Patient Instructions (Signed)
Script for Qvar sent  Order- CXR- dx chronic bronchitis with exacerbation              Lab- CBC w diff                     Sputum C&S- routine, fungal, AFB  Depo 80

## 2015-03-06 ENCOUNTER — Other Ambulatory Visit: Payer: Self-pay | Admitting: Internal Medicine

## 2015-03-06 ENCOUNTER — Other Ambulatory Visit: Payer: Medicare Other

## 2015-03-06 DIAGNOSIS — J209 Acute bronchitis, unspecified: Secondary | ICD-10-CM

## 2015-03-06 DIAGNOSIS — J42 Unspecified chronic bronchitis: Principal | ICD-10-CM

## 2015-03-06 NOTE — Progress Notes (Signed)
Quick Note:  Called and spoke with pt. Reviewed results and recs. Pt voiced understanding and had no further questions. ______ 

## 2015-03-07 DIAGNOSIS — J9611 Chronic respiratory failure with hypoxia: Secondary | ICD-10-CM | POA: Insufficient documentation

## 2015-03-07 NOTE — Assessment & Plan Note (Signed)
She saturates adequately at rest on room air but continues to need oxygen for exertion and sleep

## 2015-03-07 NOTE — Assessment & Plan Note (Signed)
No recurrence. 

## 2015-03-07 NOTE — Assessment & Plan Note (Signed)
Exacerbation of bronchitis component after recent pneumonia, now gradually improving Plan-refill Qvar 80, update chest x-ray, culture sputum, CBC with differential, Depo-Medrol

## 2015-03-09 LAB — RESPIRATORY CULTURE OR RESPIRATORY AND SPUTUM CULTURE
Culture: NORMAL
ORGANISM ID, BACTERIA: NORMAL

## 2015-03-10 ENCOUNTER — Telehealth: Payer: Self-pay | Admitting: Internal Medicine

## 2015-03-10 NOTE — Telephone Encounter (Signed)
Spoke with the pt  I advised sputum results not back yet, takes time to grow  I advised will call her once results are back  She verbalized understanding  Nothing further needed

## 2015-03-22 ENCOUNTER — Other Ambulatory Visit: Payer: Self-pay | Admitting: Internal Medicine

## 2015-03-23 ENCOUNTER — Telehealth: Payer: Self-pay | Admitting: Internal Medicine

## 2015-03-23 NOTE — Telephone Encounter (Signed)
Patient calling to get refill on Neb medication.  Medication was refilled today.  Patient notified. Nothing further needed.

## 2015-04-03 LAB — FUNGUS CULTURE W SMEAR: SMEAR RESULT: NONE SEEN

## 2015-04-18 LAB — AFB CULTURE WITH SMEAR (NOT AT ARMC): Acid Fast Smear: NONE SEEN

## 2015-04-28 ENCOUNTER — Other Ambulatory Visit: Payer: Self-pay | Admitting: Internal Medicine

## 2015-04-29 ENCOUNTER — Other Ambulatory Visit (INDEPENDENT_AMBULATORY_CARE_PROVIDER_SITE_OTHER): Payer: Medicare Other

## 2015-04-29 ENCOUNTER — Encounter: Payer: Self-pay | Admitting: Internal Medicine

## 2015-04-29 ENCOUNTER — Ambulatory Visit (INDEPENDENT_AMBULATORY_CARE_PROVIDER_SITE_OTHER): Payer: Medicare Other | Admitting: Internal Medicine

## 2015-04-29 VITALS — BP 110/80 | HR 98 | Temp 97.9°F | Ht 66.0 in | Wt 155.1 lb

## 2015-04-29 DIAGNOSIS — E785 Hyperlipidemia, unspecified: Secondary | ICD-10-CM | POA: Diagnosis not present

## 2015-04-29 DIAGNOSIS — E039 Hypothyroidism, unspecified: Secondary | ICD-10-CM

## 2015-04-29 DIAGNOSIS — J9611 Chronic respiratory failure with hypoxia: Secondary | ICD-10-CM

## 2015-04-29 DIAGNOSIS — Z23 Encounter for immunization: Secondary | ICD-10-CM

## 2015-04-29 LAB — LIPID PANEL
CHOLESTEROL: 192 mg/dL (ref 0–200)
HDL: 76.7 mg/dL (ref >39.00)
LDL CALC: 88 mg/dL (ref 0–99)
NonHDL: 115.21
TRIGLYCERIDES: 135 mg/dL (ref 0.0–149.0)
Total CHOL/HDL Ratio: 3
VLDL: 27 mg/dL (ref 0.0–40.0)

## 2015-04-29 LAB — TSH: TSH: 0.32 u[IU]/mL — AB (ref 0.35–4.50)

## 2015-04-29 MED ORDER — VITAMIN D 50 MCG (2000 UT) PO TABS: 2000.0000 [IU] | ORAL_TABLET | Freq: Every day | ORAL | Status: AC

## 2015-04-29 NOTE — Assessment & Plan Note (Signed)
Lab Results  Component Value Date   TSH 1.02 11/04/2014  check TSH annually and prn The current medical regimen is effective;  continue present plan and medications.

## 2015-04-29 NOTE — Assessment & Plan Note (Signed)
2LPM [place] O2 qhs and prn - Follows with pulm for same-  Exacerbation with recurrent PNA several times in 2016, reviewed Noted CT chest 03/2014 changes - follow up 10/14/14 unchanged (to monitor changes given hx SCC hx with RULobectomy) - pt aware of same The current medical regimen is effective;  continue present plan and medications.

## 2015-04-29 NOTE — Patient Instructions (Signed)
It was good to see you today.  We have reviewed your prior records including labs and tests today  Flu shot updated today  Test(s) ordered today. Your results will be released to [place] (or called to you) after review, usually within 72hours after test completion. If any changes need to be made, you will be notified at that same time.  Medications reviewed and updated, no changes recommended at this time. Refill on medication(s) as discussed today.  Please schedule followup in 3-6 months with new PCP, call sooner if problems.

## 2015-04-29 NOTE — Progress Notes (Signed)
Subjective:    Patient ID: [name], female    DOB: 31-Oct-1932, 81 y.o.   MRN: [id number]  HPI  Patient here for follow-up. Reviewed chronic medical issues, interval events and current concerns  Past Medical History  Diagnosis Date  . Allergic rhinitis   . COPD (chronic obstructive pulmonary disease) ([place])   . Pelvic cyst   . Bladder atony   . Unspecified hypothyroidism   . Hyperlipidemia   . Non-small cell carcinoma of lung, stage 1 ([place]) 2005    1.4 cm Poorly differentiated Squamous cell RUL  T1N0 resected 09/22/2003  . Asthma   . Emphysema of lung ([place])   . Diarrhea   . On home oxygen therapy     "3L q night and prn during the day" (12/22/2014)  . Pneumonia     "this is the 3rd time that I can remember" (12/22/2014)  . Chronic bronchitis ([place])   . GERD (gastroesophageal reflux disease)     "w/spicey foods" (12/22/2014)  . Arthritis     "back, hands; hips" (12/22/2014)  . Anxiety     Review of Systems  Constitutional: Positive for fatigue. Negative for unexpected weight change.  Respiratory: Positive for cough (chronic, baseline) and wheezing. Negative for chest tightness and shortness of breath.   Cardiovascular: Negative for chest pain and leg swelling.       Objective:    Physical Exam  Constitutional: She is oriented to person, place, and time. She appears well-developed and well-nourished. No distress.  Spouse at side  Cardiovascular: Normal rate, regular rhythm and normal heart sounds.   No murmur heard. Pulmonary/Chest: Effort normal. No respiratory distress. She has wheezes (end exp bilaterally). She exhibits no tenderness.  Decreased air mvmt B  Musculoskeletal: She exhibits no edema.  Neurological: She is alert and oriented to person, place, and time. No cranial nerve deficit. Coordination normal.  Vitals reviewed.   BP 110/80 mmHg  Pulse 98  Temp(Src) 97.9 F (36.6 C) (Oral)  Ht '5\' 6"'$  (1.676 m)  Wt 155 lb 2 oz (70.364 kg)  BMI 25.05 kg/m2   SpO2 80% Wt Readings from Last 3 Encounters:  04/29/15 155 lb 2 oz (70.364 kg)  03/05/15 156 lb 12.8 oz (71.124 kg)  01/21/15 157 lb (71.215 kg)    Lab Results  Component Value Date   WBC 7.0 03/05/2015   HGB 12.4 03/05/2015   HCT 37.8 03/05/2015   PLT 266.0 03/05/2015   GLUCOSE 148* 12/24/2014   CHOL 240* 04/30/2014   TRIG 150.0* 04/30/2014   HDL 62.30 04/30/2014   LDLDIRECT 152.8 04/10/2013   LDLCALC 148* 04/30/2014   ALT 19 05/28/2014   AST 39* 05/28/2014   NA 139 12/24/2014   K 4.2 12/24/2014   CL 105 12/24/2014   CREATININE 0.76 12/24/2014   BUN 8 12/24/2014   CO2 28 12/24/2014   TSH 1.02 11/04/2014   INR 0.94 02/05/2010   HGBA1C 5.2 02/22/2012    Dg Chest 2 View  03/05/2015  CLINICAL DATA:  Shortness of breath.  Cough. EXAM: CHEST  2 VIEW COMPARISON:  None. FINDINGS: Mediastinum hilar structures are stable. Heart size stable. No pulmonary venous congestion. Postsurgical changes right lung. Persistent right base infiltrate and right pleural effusion. No interim clearing. Previously identified pulmonary nodule over the right mid lung appears slightly smaller on today's exam. Continued follow-up chest x-rays recommended to demonstrate clearing. IMPRESSION: Postsurgical changes right lung with pleural parenchymal scarring. Persistent right lower lobe infiltrate and right pleural  effusion. Continued follow-up chest x-rays recommended to demonstrate clearing and/or stability. Nodular density previously identified right mid lung appears smaller on today's exam . Electronically Signed   By: Roebuck   On: 03/05/2015 16:56       Assessment & Plan:   Problem List Items Addressed This Visit    Chronic respiratory failure with hypoxia (HCC) - Primary    2LPM [place] O2 qhs and prn - Follows with pulm for same-  Exacerbation with recurrent PNA several times in 2016, reviewed Noted CT chest 03/2014 changes - follow up 10/14/14 unchanged (to monitor changes given hx SCC hx with  RULobectomy) - pt aware of same The current medical regimen is effective;  continue present plan and medications.      Dyslipidemia (Chronic)    Prev on various statin trials Remote on atrova but stopped for myalgias; then increase lipids prompted resume of med tx in 2012 - crestor caused muscle fatigue so decrease dose (protected by high HDL) resumed generic statin 10/2011 due to $$ issues - prava, then lova -takes intermittently requested to resume atorva 10/2014 and reports has been taking daily since then Check annually, titrate as needed to goal and as tolerated      Relevant Orders   Lipid panel   Hypothyroidism    Lab Results  Component Value Date   TSH 1.02 11/04/2014  check TSH annually and prn The current medical regimen is effective;  continue present plan and medications.       Relevant Orders   TSH    Other Visit Diagnoses    Need for prophylactic vaccination and inoculation against influenza        Relevant Orders    Flu vaccine HIGH DOSE PF (Fluzone High dose)        Gwendolyn Grant, MD

## 2015-04-29 NOTE — Assessment & Plan Note (Signed)
Prev on various statin trials Remote on atrova but stopped for myalgias; then increase lipids prompted resume of med tx in 2012 - crestor caused muscle fatigue so decrease dose (protected by high HDL) resumed generic statin 10/2011 due to $$ issues - prava, then lova -takes intermittently requested to resume atorva 10/2014 and reports has been taking daily since then Check annually, titrate as needed to goal and as tolerated

## 2015-05-01 ENCOUNTER — Telehealth: Payer: Self-pay | Admitting: Internal Medicine

## 2015-05-01 NOTE — Telephone Encounter (Signed)
Pt called in and said that [facility] is faxing over request for a transport chair.  She also would like a call back about her labs   [facility] number [phone]

## 2015-05-04 ENCOUNTER — Telehealth: Payer: Self-pay

## 2015-05-04 NOTE — Telephone Encounter (Signed)
Pt wanted to know if she could take the cholesterol medication every other day.

## 2015-05-04 NOTE — Telephone Encounter (Signed)
Called pt back and she informed same as below.  Sent a message to [facility] requesting information.

## 2015-05-04 NOTE — Telephone Encounter (Signed)
Every other day better than none at all - so,yes. thanks

## 2015-05-04 NOTE — Telephone Encounter (Signed)
Please give patient a call back

## 2015-05-05 NOTE — Telephone Encounter (Signed)
Pt informed of PCP response.

## 2015-05-06 ENCOUNTER — Telehealth: Payer: Self-pay | Admitting: Internal Medicine

## 2015-05-06 DIAGNOSIS — J449 Chronic obstructive pulmonary disease, unspecified: Secondary | ICD-10-CM

## 2015-05-06 NOTE — Telephone Encounter (Signed)
Spoke with pt.  States she has been using the same nebulizer tubing set with medication canister for a while, and it is worn out.  States [facility] advised we would need to send order.  Order placed.  Pt aware and voiced no further questions or concerns at this time.

## 2015-05-20 ENCOUNTER — Telehealth: Payer: Self-pay | Admitting: Internal Medicine

## 2015-05-20 DIAGNOSIS — J449 Chronic obstructive pulmonary disease, unspecified: Secondary | ICD-10-CM

## 2015-05-20 NOTE — Telephone Encounter (Signed)
Called and spoke to pt. Pt is requesting neb supplies and states she now needs an rx. Called her DME, [facility] at [phone]. And was informed that the pt now owns the nebulizer machine and will have to pay out of pocket and need an rx. Order placed for supplies for neb machine. Called and informed pt. Pt verbalized understanding and denied any further questions or concerns at this time.

## 2015-05-22 ENCOUNTER — Other Ambulatory Visit: Payer: Self-pay | Admitting: Internal Medicine

## 2015-05-25 ENCOUNTER — Ambulatory Visit (INDEPENDENT_AMBULATORY_CARE_PROVIDER_SITE_OTHER)
Admission: RE | Admit: 2015-05-25 | Discharge: 2015-05-25 | Disposition: A | Discharge status: Home / Self Care | Payer: Medicare Other | Source: Ambulatory Visit | Attending: Internal Medicine | Admitting: Internal Medicine

## 2015-05-25 ENCOUNTER — Telehealth: Payer: Self-pay | Admitting: Internal Medicine

## 2015-05-25 DIAGNOSIS — J449 Chronic obstructive pulmonary disease, unspecified: Secondary | ICD-10-CM

## 2015-05-25 MED ORDER — PREDNISONE 10 MG PO TABS: ORAL_TABLET | ORAL | Status: DC

## 2015-05-25 NOTE — Telephone Encounter (Signed)
LMOMTCB x 1 

## 2015-05-25 NOTE — Telephone Encounter (Signed)
Spoke with pt, states 02 sats are dropping down to upper 70's on room air X1 week, increased sob, prod cough with white/light yellow mucus.  Denies fever.   Pt requesting further recs.    Pt uses [facility] on [address]    Last ov: 03/05/15 Next ov: 07/06/15  CY please advise on recs.  Thanks!  Allergies  Allergen Reactions  . Daliresp [Roflumilast] Nausea Only  . Tiotropium Bromide Monohydrate     Urinary retention  . Ampicillin     REACTION: GI upset  . Azithromycin     REACTION: diarrhea  . Codeine   . Fludrocortisone Acetate Itching  . Milk-Related Compounds   . Nitrofurantoin     REACTION: Upset GI  . Penicillins   . Sulfonamide Derivatives    Current Outpatient Prescriptions on File Prior to Visit  Medication Sig Dispense Refill  . acetaminophen (TYLENOL ARTHRITIS PAIN) 650 MG CR tablet Take 650 mg by mouth as needed for pain.    [name] ALPRAZolam (XANAX) 1 MG tablet Take 1 tablet (1 mg total) by mouth at bedtime. 90 tablet 0  . aspirin EC 81 MG tablet Take 81 mg by mouth 2 (two) times a week.     [name] atorvastatin (LIPITOR) 40 MG tablet Take 1 tablet (40 mg total) by mouth daily. 90 tablet 3  . beclomethasone (QVAR) 80 MCG/ACT inhaler Inhale 2 puffs into the lungs 2 (two) times daily. 8.7 g 11  . Cholecalciferol (VITAMIN D) 2000 UNITS tablet Take 1 tablet (2,000 Units total) by mouth daily. 30 tablet 11  . levalbuterol (XOPENEX) 1.25 MG/3ML nebulizer solution Take 1.25 mg by nebulization every 8 (eight) hours as needed for wheezing or shortness of breath. 72 mL 2  . levothyroxine (SYNTHROID, LEVOTHROID) 100 MCG tablet Take 1 tablet (100 mcg total) by mouth daily before breakfast. 90 tablet 1  . Multiple Vitamins-Minerals (CENTRUM SILVER PO) Take 1 tablet by mouth daily.     . promethazine (PHENERGAN) 25 MG tablet Take 1 tablet (25 mg total) by mouth every 6 (six) hours as needed for nausea or vomiting. 30 tablet 0  . Respiratory Therapy Supplies (FLUTTER) DEVI Blow through 4 times  per cycle and repeat 3 cycles per day 1 each 0   No current facility-administered medications on file prior to visit.

## 2015-05-25 NOTE — Telephone Encounter (Signed)
LMTCB x1 for pt.  

## 2015-05-25 NOTE — Telephone Encounter (Signed)
Patient calling back stating she missed a call from our office.

## 2015-05-25 NOTE — Telephone Encounter (Signed)
Patient Returned call [phone]

## 2015-05-25 NOTE — Telephone Encounter (Signed)
Called spoke with pt. Aware of recs. I have sent in prednisone. She will have CXR done in the AM. Nothing further needed

## 2015-05-25 NOTE — Telephone Encounter (Signed)
Patient Returned call

## 2015-05-25 NOTE — Telephone Encounter (Signed)
Suggest come by for outpatient CXR  Dx COPD mixed type                                   If not an obvious infection, offer prednisone 10 mg., # 20, 4 X 2 DAYS, 3 X 2 DAYS, 2 X 2 DAYS, 1 X 2 DAYS

## 2015-05-26 ENCOUNTER — Telehealth: Payer: Self-pay | Admitting: Internal Medicine

## 2015-05-26 NOTE — Telephone Encounter (Signed)
Result Notes     Notes Recorded by Deneise Lever, MD on 05/26/2015 at 2:40 PM CXR is stable with only chronic changes including scarring and small nodule which is probably benign. Good report.     Pt notified of results Nothing further is needed

## 2015-06-02 ENCOUNTER — Other Ambulatory Visit: Payer: Self-pay | Admitting: Internal Medicine

## 2015-06-04 ENCOUNTER — Ambulatory Visit (INDEPENDENT_AMBULATORY_CARE_PROVIDER_SITE_OTHER): Payer: Medicare Other | Admitting: Internal Medicine

## 2015-06-04 ENCOUNTER — Encounter: Payer: Self-pay | Admitting: Internal Medicine

## 2015-06-04 VITALS — BP 128/72 | HR 83 | Ht 66.0 in | Wt 157.0 lb

## 2015-06-04 DIAGNOSIS — R079 Chest pain, unspecified: Secondary | ICD-10-CM | POA: Diagnosis not present

## 2015-06-04 DIAGNOSIS — J449 Chronic obstructive pulmonary disease, unspecified: Secondary | ICD-10-CM | POA: Diagnosis not present

## 2015-06-04 DIAGNOSIS — J9611 Chronic respiratory failure with hypoxia: Secondary | ICD-10-CM | POA: Diagnosis not present

## 2015-06-04 MED ORDER — LEVALBUTEROL HCL 1.25 MG/3ML IN NEBU: INHALATION_SOLUTION | RESPIRATORY_TRACT | Status: DC

## 2015-06-04 NOTE — Patient Instructions (Signed)
Script refilling levalbuterol neb solution  Ok to set Oxygen at [facility]  Please call as needed

## 2015-06-04 NOTE — Progress Notes (Signed)
Patient ID: [name], female    DOB: 1932/07/18, 81 y.o.   MRN: [id number]  HPI 816/12-79 year old female former smoker followed for history of right upper lobe cancer, COPD, allergic rhinitis  Here with husband Last here -August 05, 2010- note reviewed Increased  coughing 2 weeks, but struggling all summer. Better at beach. Here few days ago to see Dr Ronnald Ramp.   Had significantly productive cough- purulent, sometimes  with black spots. Uses nebulizer 2-3x/day, O2 2l/ [employer]. On Avelox now with Symbicort from Dr Ronnald Ramp. Mouth is getting sore. Still actively wheezing and short of breath.  CXR December 07, 2010- clear except old surgical changes.  She is not sure if she began this flare with cold or allergy. Low fever once or twice in past month. CT w/cm- 12/21/10- normal heart size coronary calcification. Prior right upper lobectomy. Stable right millimeter nodule at right minor fissure. No suspicious nodules. Centrilobular emphysema.  05/24/11- 81 year old female former smoker followed for history of right upper lobe cancer, COPD, allergic rhinitis. Here with husband Has had flu shot. She was feeling well until one week ago when she developed sore throat, temperature to 99.7, chills and malaise, cough productive of purulent blood-streaked sputum. Has home nebulizer being used only once daily with Xopenex. She denies swollen glands, fluid retention, GI upset or chest pain.  06/28/11- 81 year old female former smoker followed for history of right upper lobe cancer, COPD, allergic rhinitis. Here with husband Reports- wheezing, chest congestion, runny nose, cough with green/red mucus tinged with blood.  She got well after last visit and been sick again. Did have flu vaccine. Now 3 or 4 days of rhinorrhea, chills, productive cough with discolored sputum. 3 days ago had brief episode of pink in sputum with hard cough and. Cough and sputum production are less today. Occasional tussive soreness  at the xiphoid. Frequent heartburn. Doxycycline and seemed to help but is completed now. Does have home nebulizer.  Had chest x-ray January 7: *RADIOLOGY REPORT* 06/27/11- Clinical Data: 81 year old with hemoptysis.  CHEST - 2 VIEW  Comparison: Chest radiograph 05/24/2011  Findings: Chronic volume loss and postoperative changes in the  right hemithorax. The upper lungs are clear with increased  lucency. Findings suggest emphysematous changes. Stable linear  density at the right lung base is suggestive for scar. The heart  and mediastinum are stable. Slightly increased interstitial  densities in the right mid chest are similar to the prior  examination. No focal airspace disease.  IMPRESSION:  Stable chest radiograph findings. No acute changes.  Original Report Authenticated By: Markus Daft, M.D.   10/13/11- 81 year old female former smoker followed for history of right upper lobe cancer, COPD, allergic rhinitis. Here with husband Always has SOB, wheezing, cough, and congestion-has gotten worse due to pollen-eyes bothering her and runny nose(sneezing) Increased congestion in head and nose. She had had a pneumonia in December and then had fallen at home in January, bruising her face. Using nebulizer twice a day. Complains portable oxygen is too heavy and is thinking about [facility]  02/16/12- 81 year old female former smoker followed for history of right upper lobe NSCCa, COPD, allergic rhinitis. Here with husband Sore throat and tongue-? white spots started 2-3 days ago; last night had cough-productive. Recent cortisone shot by orthopedist. COPD assessment test (CAT) score 13/24 CXR 01/06/12-reviewed with her IMPRESSION:  Stable emphysematous change and chronic postsurgical change of the  right lung without definite acute cardiopulmonary disease.  Original Report Authenticated By:  Rachel Moulds, M.D.   10/15/12- 53year-old female former smoker followed for history of  right upper lobe NSCCa, COPD, allergic rhinitis.  FOLLOWS DEY:CXKGYJEHU wheezing today; ? pollen causing it. Did well through the winter. Using oxygen 2 L/[facility]. Productive cough after being outdoors a lot yesterday. Clear sputum, no fever.  03/04/13- 15year-old female former smoker followed for history of right upper lobe Squamous Cell, COPD, allergic rhinitis FOLLOWS FOR:  Increased SOB and coughing w/ yellowish mucus since 9/11 Trying to help husband who has NHL. She reports one-week increased cough, green sputum. We sent doxycycline-sputum turning yellow. No fever or GI upset. Nasal congestion without headache. 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 Postoperative changes at the right lung base.  2. No acute cardiopulmonary disease or evidence for recurrent  disease.  Original Report Authenticated By: San Morelle, M.D.  04/16/13- 87year-old female former smoker followed for history of right upper lobe NSCCa, COPD, allergic rhinitis Follows for- Pt states her problems are improving.  Pt c/o mild prod cough with yellowish sputum with activity. Less cough, some nasal congestion. Less dyspnea on exertion. 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 HR and BP were high. Did not go to ER.   patient describes cough, white sputum, blowing white from nose, headache, raw throat. Denies fever or myalgias except feels sore across front of chest from coughing. No GI  upset. CXR 12/31/12 IMPRESSION:  1. Postoperative changes at the right lung base.  2. No acute cardiopulmonary disease or evidence for recurrent  disease.  Original Report Authenticated By: San Morelle, M.D.  10/15/13- 30year-old female former smoker followed for history of right upper lobe NSCCa, COPD, allergic rhinitis, complicated by glaucoma   Family here    Husband has cancer FOLLOWS FOR:  Sinus and chest congestion with cough white mucus and sneezing x3 days Exacerbation cleared well with Avelox and that worried. Had been well until 3 days ago now-has sneezing, watery nose blamed on pollen. Damp weather increases cough and head congestion. Baseline is daily chronic productive cough with white sputum in the mornings. CXR 07/29/13 IMPRESSION:  Chronic volume loss and pleural thickening in the right hemithorax.  No acute chest findings.  Electronically Signed  By: Markus Daft M.D.  On: 07/26/2013 17:49  04/01/14- 50year-old female former smoker followed for history of right upper lobe NSCCa, COPD, allergic rhinitis, complicated by glaucoma    Husband here, has cancer ACUTE VISIT: cough-productive-pinkish/red in color; recently at ER, CP and upper back pain that went down her arm. SOB as well. D-dimer, CBC, troponin and BMET were unremarkable Pt describes chest congestion starting 4-5 days ago, then sore L scapula x 2 days, maybe low grade fever and chilly one day. Woke with pain leftr upper arm 2 days ago. Went to ER. Taking capsules of otc mucus thinner with red color- blames that for pink sputum and mild queasy. Some occasional wheeze. Tussive soreness R lower anterior costal margin. Achey all over "like flu". Has not had flu shot yet. Shoulder pain is  gone.  CXR 03/29/14 IMPRESSION:  Stable scarring on right with volume loss. Underlying emphysema. No  edema or consolidation. No appreciable change compared to prior  study.    06/30/2014 acute  ov/[facility] re: cough/ fever/ brown sputum   Chief Complaint  Patient presents with  . Acute Visit    Pt c/o increased cough for the past 2 days- prod with dark brown/red sputum.  She also c/o HA, body aches, chills and low grade temp.   acutely since 06/29/14 Usually use neb just once a day but increased to three times daily > comfortable at rest p neb  Has flutter valve  But not using   CXR PA and Lateral:   06/30/2014 :  1. Diffuse infiltrates with nodularity in right lung again noted. Findings most likely infectious including possible granulomatous infection . Associated small right pleural effusion.  03/05/15- 37year-old female former smoker followed for history of right upper lobe resection NSCCa, COPD, allergic rhinitis, complicated by glaucoma    Husband here, has cancer Follows For: pt states shes been having alot of congestion. pt hasd pnuemonia July 4th pt was admitted at [facility] for 4 days.  pt has had to use oxygen most of the time day and night. pt using 2LPN when using O2. pt c/o productive cough white in color. pt had xrays at [facility]. We reviewed chest x-ray showing old postoperative changes on the right with more recent pneumonia.  Using oxygen 2 L for sleep and when necessary/[facility]. Has Flutter device. Cough stays productive, usually white. No blood. Got flu vaccine from primary physician. Gives history of intolerance to oral antibiotics because of GI upset so she needs to get intravenous antibiotics if treated.    CXR 12/22/14 IMPRESSION: Acute on chronic disease at the right lung base. Findings concerning for an acute infectious or inflammatory process in the right lower lung. Electronically Signed  By: Markus Daft M.D.  On: 12/22/2014 10:47    06/04/2015-81 year old female former smoker followed for history of right upper lobe resection/NSCCA, COPD, allergic rhinitis, complicated by glaucoma O2 2 L sleep, exertion/[facility]      flu vaccine UTD   husband here O2 sat sometimes drops  when she is on long hose at home. Discussed turning it up to 3 when that happens. Feeling better since last prednisone taper 4 days ago. Occasional cramp right lower anterior rib cage. Occasional aching left arm and hand cramp, not exertional. No palpitation. Discussed chest x-ray image CXR 05/25/2015 IMPRESSION: 1. Stable right base postsurgical change with pleural parenchymal scarring. 2.Stable right mid lung field tiny pulmonary nodule. Electronically Signed: By: Marcello Moores Register On: 05/25/2015 16:53  ROS-see HPI   Negative unless "+" Constitutional:    weight loss, night sweats, fevers, chills, fatigue, lassitude. HEENT:    headaches, difficulty swallowing, tooth/dental problems, sore throat,       sneezing, itching, ear ache, nasal congestion, post nasal drip, snoring CV:    + chest pain, orthopnea, PND, swelling in lower extremities, anasarca,                                                        dizziness, palpitations Resp:   + shortness of breath with exertion or at rest.               +  productive cough,   non-productive cough, coughing up of blood.              change in color of mucus.  wheezing.   Skin:    rash or lesions. GI:  No-   heartburn, indigestion, abdominal pain, nausea, vomiting,  GU:  MS:   joint pain, stiffness, . Neuro-     nothing unusual Psych:  change in mood or affect.  depression or anxiety.   memory loss.   Objective:   OBJ- Physical Exam General- Alert, Oriented, Affect-appropriate, Distress- none acute,                  saturation 94% room air at rest (left oxygen in waiting room) Skin- rash-none, lesions- none, excoriation- none Lymphadenopathy- none Head- atraumatic            Eyes- Gross vision intact, PERRLA, conjunctivae and secretions clear            Ears- Hearing, canals-normal            Nose- Clear, no-Septal dev, mucus, polyps, erosion, perforation             Throat- Mallampati II , mucosa clear , drainage- none, tonsils-  atrophic Neck- flexible , trachea midline, no stridor , thyroid nl, carotid no bruit Chest - symmetrical excursion , unlabored           Heart/CV- RRR , no murmur , no gallop  , no rub, nl s1 s2                           - JVD- none , edema- none, stasis changes- none, varices- none           Lung- clear to P&A, wheeze + trace at left base, cough- none , dullness-none, rub- none           Chest wall-  Abd-  Br/ Gen/ Rectal- Not done, not indicated Extrem- cyanosis- none, clubbing, none, atrophy- none, strength- nl Neuro- grossly intact to observation

## 2015-06-05 ENCOUNTER — Telehealth: Payer: Self-pay | Admitting: Internal Medicine

## 2015-06-05 DIAGNOSIS — R079 Chest pain, unspecified: Secondary | ICD-10-CM | POA: Insufficient documentation

## 2015-06-05 DIAGNOSIS — J449 Chronic obstructive pulmonary disease, unspecified: Secondary | ICD-10-CM

## 2015-06-05 NOTE — Assessment & Plan Note (Signed)
She remains dependent on oxygen. Plan-increase O2 to 3 L when using longer hose at home

## 2015-06-05 NOTE — Telephone Encounter (Signed)
LMTCB x 1 

## 2015-06-05 NOTE — Assessment & Plan Note (Signed)
Somewhat atypical and nonspecific, but with description of pain sometimes going into left arm we discussed possibility of angina. She will discuss this with her primary physician with consideration of cardiology referral

## 2015-06-05 NOTE — Assessment & Plan Note (Signed)
Recent exacerbation responded to prednisone taper. Now at baseline. Plan-refill Xopenex for her nebulizer machine, needing to avoid overstimulation

## 2015-06-08 NOTE — Telephone Encounter (Signed)
[phone], pt [name]

## 2015-06-08 NOTE — Telephone Encounter (Signed)
Patient needs her oxygen switched from [facility] to another DME company because her insurance company is no longer covered through [employer].  Patient is requesting to be switched to [facility].  Patient also has nebulizer machine, she owns the neb machine.   Patient wears 3L at all times on [employer].  Wants portable machine on rollers.  Current portable machine is too heavy for her to carry.  Put in order for change in oxygen order/DME company. Patient aware of order. Nothing further needed. Closing encounter

## 2015-06-10 ENCOUNTER — Telehealth: Payer: Self-pay | Admitting: Internal Medicine

## 2015-06-10 NOTE — Telephone Encounter (Signed)
Last seen on 06/04/15  Patient Instructions     Script refilling levalbuterol neb solution  Ok to set Oxygen at 3Liter  Please call as needed   Called and spoke with patient. She c/o prod cough with green to yellow colored mucus, chest tightness, and body aches starting this morning. Denies any sinus pressure/drainage, fever, nausea or vomiting. She stated that she would like to know if CY thinks she needs another pred taper so that she is not sick at Christmas. Verified pharmacy as [facility] on [address].  I spoke with Joellen Jersey and she states CY is out of the office this afternoon but offered an ov with CY on 06/11/15 at 10:30 and patient agreed to ov. Patient voiced understanding and had no further questions. Nothing further needed. Will sign off on message.

## 2015-06-11 ENCOUNTER — Ambulatory Visit: Payer: Medicare Other | Admitting: Internal Medicine

## 2015-06-11 ENCOUNTER — Encounter: Payer: Self-pay | Admitting: Internal Medicine

## 2015-06-11 VITALS — BP 134/70 | HR 81 | Temp 98.0°F | Ht 66.0 in | Wt 156.4 lb

## 2015-06-11 DIAGNOSIS — J449 Chronic obstructive pulmonary disease, unspecified: Secondary | ICD-10-CM

## 2015-06-11 NOTE — Patient Instructions (Addendum)
Ok to treat this as a viral infection with bronchitis and gastroenteritis  Stay hydrated, use your home meds as usual.  Please call as needed

## 2015-06-11 NOTE — Progress Notes (Signed)
Patient ID: [name], female    DOB: 1932/07/18, 81 y.o.   MRN: [id number]  HPI 816/12-81 year old female former smoker followed for history of right upper lobe cancer, COPD, allergic rhinitis  Here with husband Last here -August 05, 2010- note reviewed Increased  coughing 2 weeks, but struggling all summer. Better at beach. Here few days ago to see Dr Ronnald Ramp.   Had significantly productive cough- purulent, sometimes  with black spots. Uses nebulizer 2-3x/day, O2 2l/ [employer]. On Avelox now with Symbicort from Dr Ronnald Ramp. Mouth is getting sore. Still actively wheezing and short of breath.  CXR December 07, 2010- clear except old surgical changes.  She is not sure if she began this flare with cold or allergy. Low fever once or twice in past month. CT w/cm- 12/21/10- normal heart size coronary calcification. Prior right upper lobectomy. Stable right millimeter nodule at right minor fissure. No suspicious nodules. Centrilobular emphysema.  05/24/11- 81 year old female former smoker followed for history of right upper lobe cancer, COPD, allergic rhinitis. Here with husband Has had flu shot. She was feeling well until one week ago when she developed sore throat, temperature to 99.7, chills and malaise, cough productive of purulent blood-streaked sputum. Has home nebulizer being used only once daily with Xopenex. She denies swollen glands, fluid retention, GI upset or chest pain.  06/28/11- 81 year old female former smoker followed for history of right upper lobe cancer, COPD, allergic rhinitis. Here with husband Reports- wheezing, chest congestion, runny nose, cough with green/red mucus tinged with blood.  She got well after last visit and been sick again. Did have flu vaccine. Now 3 or 4 days of rhinorrhea, chills, productive cough with discolored sputum. 3 days ago had brief episode of pink in sputum with hard cough and. Cough and sputum production are less today. Occasional tussive soreness  at the xiphoid. Frequent heartburn. Doxycycline and seemed to help but is completed now. Does have home nebulizer.  Had chest x-ray January 7: *RADIOLOGY REPORT* 06/27/11- Clinical Data: 81 year old with hemoptysis.  CHEST - 2 VIEW  Comparison: Chest radiograph 05/24/2011  Findings: Chronic volume loss and postoperative changes in the  right hemithorax. The upper lungs are clear with increased  lucency. Findings suggest emphysematous changes. Stable linear  density at the right lung base is suggestive for scar. The heart  and mediastinum are stable. Slightly increased interstitial  densities in the right mid chest are similar to the prior  examination. No focal airspace disease.  IMPRESSION:  Stable chest radiograph findings. No acute changes.  Original Report Authenticated By: Markus Daft, M.D.   10/13/11- 81 year old female former smoker followed for history of right upper lobe cancer, COPD, allergic rhinitis. Here with husband Always has SOB, wheezing, cough, and congestion-has gotten worse due to pollen-eyes bothering her and runny nose(sneezing) Increased congestion in head and nose. She had had a pneumonia in December and then had fallen at home in January, bruising her face. Using nebulizer twice a day. Complains portable oxygen is too heavy and is thinking about [facility]  02/16/12- 81 year old female former smoker followed for history of right upper lobe NSCCa, COPD, allergic rhinitis. Here with husband Sore throat and tongue-? white spots started 2-3 days ago; last night had cough-productive. Recent cortisone shot by orthopedist. COPD assessment test (CAT) score 13/24 CXR 01/06/12-reviewed with her IMPRESSION:  Stable emphysematous change and chronic postsurgical change of the  right lung without definite acute cardiopulmonary disease.  Original Report Authenticated By:  Rachel Moulds, M.D.   10/15/12- 53year-old female former smoker followed for history of  right upper lobe NSCCa, COPD, allergic rhinitis.  FOLLOWS DEY:CXKGYJEHU wheezing today; ? pollen causing it. Did well through the winter. Using oxygen 2 L/[facility]. Productive cough after being outdoors a lot yesterday. Clear sputum, no fever.  03/04/13- 15year-old female former smoker followed for history of right upper lobe Squamous Cell, COPD, allergic rhinitis FOLLOWS FOR:  Increased SOB and coughing w/ yellowish mucus since 9/11 Trying to help husband who has NHL. She reports one-week increased cough, green sputum. We sent doxycycline-sputum turning yellow. No fever or GI upset. Nasal congestion without headache. 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 Postoperative changes at the right lung base.  2. No acute cardiopulmonary disease or evidence for recurrent  disease.  Original Report Authenticated By: San Morelle, M.D.  04/16/13- 87year-old female former smoker followed for history of right upper lobe NSCCa, COPD, allergic rhinitis Follows for- Pt states her problems are improving.  Pt c/o mild prod cough with yellowish sputum with activity. Less cough, some nasal congestion. Less dyspnea on exertion. 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 HR and BP were high. Did not go to ER.   patient describes cough, white sputum, blowing white from nose, headache, raw throat. Denies fever or myalgias except feels sore across front of chest from coughing. No GI  upset. CXR 12/31/12 IMPRESSION:  1. Postoperative changes at the right lung base.  2. No acute cardiopulmonary disease or evidence for recurrent  disease.  Original Report Authenticated By: San Morelle, M.D.  10/15/13- 30year-old female former smoker followed for history of right upper lobe NSCCa, COPD, allergic rhinitis, complicated by glaucoma   Family here    Husband has cancer FOLLOWS FOR:  Sinus and chest congestion with cough white mucus and sneezing x3 days Exacerbation cleared well with Avelox and that worried. Had been well until 3 days ago now-has sneezing, watery nose blamed on pollen. Damp weather increases cough and head congestion. Baseline is daily chronic productive cough with white sputum in the mornings. CXR 07/29/13 IMPRESSION:  Chronic volume loss and pleural thickening in the right hemithorax.  No acute chest findings.  Electronically Signed  By: Markus Daft M.D.  On: 07/26/2013 17:49  04/01/14- 50year-old female former smoker followed for history of right upper lobe NSCCa, COPD, allergic rhinitis, complicated by glaucoma    Husband here, has cancer ACUTE VISIT: cough-productive-pinkish/red in color; recently at ER, CP and upper back pain that went down her arm. SOB as well. D-dimer, CBC, troponin and BMET were unremarkable Pt describes chest congestion starting 4-5 days ago, then sore L scapula x 2 days, maybe low grade fever and chilly one day. Woke with pain leftr upper arm 2 days ago. Went to ER. Taking capsules of otc mucus thinner with red color- blames that for pink sputum and mild queasy. Some occasional wheeze. Tussive soreness R lower anterior costal margin. Achey all over "like flu". Has not had flu shot yet. Shoulder pain is  gone.  CXR 03/29/14 IMPRESSION:  Stable scarring on right with volume loss. Underlying emphysema. No  edema or consolidation. No appreciable change compared to prior  study.    06/30/2014 acute  ov/[facility] re: cough/ fever/ brown sputum   Chief Complaint  Patient presents with  . Acute Visit    Pt c/o increased cough for the past 2 days- prod with dark brown/red sputum.  She also c/o HA, body aches, chills and low grade temp.   acutely since 06/29/14 Usually use neb just once a day but increased to three times daily > comfortable at rest p neb  Has flutter valve  But not using   CXR PA and Lateral:   06/30/2014 :  1. Diffuse infiltrates with nodularity in right lung again noted. Findings most likely infectious including possible granulomatous infection . Associated small right pleural effusion.  03/05/15- 46year-old female former smoker followed for history of right upper lobe resection NSCCa, COPD, allergic rhinitis, complicated by glaucoma    Husband here, has cancer Follows For: pt states shes been having alot of congestion. pt hasd pnuemonia July 4th pt was admitted at [facility] for 4 days.  pt has had to use oxygen most of the time day and night. pt using 2LPN when using O2. pt c/o productive cough white in color. pt had xrays at [facility]. We reviewed chest x-ray showing old postoperative changes on the right with more recent pneumonia.  Using oxygen 2 L for sleep and when necessary/[facility]. Has Flutter device. Cough stays productive, usually white. No blood. Got flu vaccine from primary physician. Gives history of intolerance to oral antibiotics because of GI upset so she needs to get intravenous antibiotics if treated.    CXR 12/22/14 IMPRESSION: Acute on chronic disease at the right lung base. Findings concerning for an acute infectious or inflammatory process in the right lower lung. Electronically Signed  By: Markus Daft M.D.  On: 12/22/2014 10:47    06/04/2015-81 year old female former smoker followed for history of right upper lobe resection/NSCCA, COPD, allergic rhinitis, complicated by glaucoma O2 2 L sleep, exertion/[facility]      flu vaccine UTD   husband here O2 sat sometimes drops  when she is on long hose at home. Discussed turning it up to 3 when that happens. Feeling better since last prednisone taper 4 days ago. Occasional cramp right lower anterior rib cage. Occasional aching left arm and hand cramp, not exertional. No palpitation. Discussed chest x-ray image CXR 05/25/2015 IMPRESSION: 1. Stable right base postsurgical change with pleural parenchymal scarring. 2.Stable right mid lung field tiny pulmonary nodule. Electronically Signed: By: Marcello Moores Register On: 05/25/2015 16:53  06/11/2015-81 year old female former smoker followed for history right upper lobe resection/And SCCA, COPD, allergic rhinitis, consultative by glaucoma O2 3 L sleep, exertion/changd to [facility]     flu vaccine UTD    Pt c/o body aches, diarrhea x 1 day, chest tightness, CP, increased cough with green/yellow mucus production. Denies fever.        ROS-see HPI   Negative unless "+" Constitutional:    weight loss, night sweats, fevers, chills, fatigue, lassitude. HEENT:    headaches, difficulty swallowing, tooth/dental problems, sore throat,       sneezing, itching, ear ache, nasal congestion, post nasal drip, snoring CV:    + chest pain, orthopnea, PND, swelling in lower extremities, anasarca,  dizziness, palpitations Resp:   + shortness of breath with exertion or at rest.               + productive cough,   non-productive cough, coughing up of blood.              change in color of mucus.  wheezing.   Skin:    rash or lesions. GI:  No-   heartburn, indigestion, abdominal pain, nausea, vomiting,  GU:  MS:   joint pain, stiffness, . Neuro-     nothing unusual Psych:  change in mood or affect.  depression or anxiety.   memory loss.   Objective:   OBJ- Physical Exam General- Alert, Oriented, Affect-appropriate, Distress- none acute,                  saturation 94% room air at rest (left oxygen in waiting room) Skin- rash-none,  lesions- none, excoriation- none Lymphadenopathy- none Head- atraumatic            Eyes- Gross vision intact, PERRLA, conjunctivae and secretions clear            Ears- Hearing, canals-normal            Nose- Clear, no-Septal dev, mucus, polyps, erosion, perforation             Throat- Mallampati II , mucosa clear , drainage- none, tonsils- atrophic Neck- flexible , trachea midline, no stridor , thyroid nl, carotid no bruit Chest - symmetrical excursion , unlabored           Heart/CV- RRR , no murmur , no gallop  , no rub, nl s1 s2                           - JVD- none , edema- none, stasis changes- none, varices- none           Lung- clear to P&A, wheeze + trace at left base, cough- none , dullness-none, rub- none           Chest wall-  Abd-  Br/ Gen/ Rectal- Not done, not indicated Extrem- cyanosis- none, clubbing, none, atrophy- none, strength- nl Neuro- grossly intact to observation

## 2015-06-29 ENCOUNTER — Other Ambulatory Visit: Payer: Self-pay | Admitting: Internal Medicine

## 2015-07-06 ENCOUNTER — Ambulatory Visit (INDEPENDENT_AMBULATORY_CARE_PROVIDER_SITE_OTHER): Payer: PPO | Admitting: Internal Medicine

## 2015-07-06 ENCOUNTER — Encounter: Payer: Self-pay | Admitting: Internal Medicine

## 2015-07-06 VITALS — BP 126/72 | HR 82 | Ht 66.0 in | Wt 158.6 lb

## 2015-07-06 DIAGNOSIS — J449 Chronic obstructive pulmonary disease, unspecified: Secondary | ICD-10-CM

## 2015-07-06 DIAGNOSIS — J9611 Chronic respiratory failure with hypoxia: Secondary | ICD-10-CM | POA: Diagnosis not present

## 2015-07-06 MED ORDER — PREDNISONE 10 MG PO TABS: ORAL_TABLET | ORAL | Status: DC

## 2015-07-06 MED ORDER — ALBUTEROL SULFATE HFA 108 (90 BASE) MCG/ACT IN AERS: 2.0000 | INHALATION_SPRAY | Freq: Four times a day (QID) | RESPIRATORY_TRACT | Status: DC | PRN

## 2015-07-06 NOTE — Assessment & Plan Note (Signed)
Exacerbation probably reflecting weather and recent viral infection Plan-short course prednisone, increased use of nebulizer machine, refill rescue inhaler

## 2015-07-06 NOTE — Patient Instructions (Addendum)
Order- DME [facility]- continue O2 for sleep and exertion 2L/ min. Evaluate for light portable O2 concentrator                  Dx chronic respiratory failure with hypoxia, COPD mixed type  Scripts sent for a short course of prednisone, and for an albuterol rescue inhaler to use if needed  Try using your nebulizer machine 3  Or 4 times daily if you are having more cough and wheeze

## 2015-07-06 NOTE — Progress Notes (Signed)
Patient ID: [name], female    DOB: 1932/07/18, 82 y.o.   MRN: [id number]  HPI 816/12-82 year old female former smoker followed for history of right upper lobe cancer, COPD, allergic rhinitis  Here with husband Last here -August 05, 2010- note reviewed Increased  coughing 2 weeks, but struggling all summer. Better at beach. Here few days ago to see Dr Ronnald Ramp.   Had significantly productive cough- purulent, sometimes  with black spots. Uses nebulizer 2-3x/day, O2 2l/ [employer]. On Avelox now with Symbicort from Dr Ronnald Ramp. Mouth is getting sore. Still actively wheezing and short of breath.  CXR December 07, 2010- clear except old surgical changes.  She is not sure if she began this flare with cold or allergy. Low fever once or twice in past month. CT w/cm- 12/21/10- normal heart size coronary calcification. Prior right upper lobectomy. Stable right millimeter nodule at right minor fissure. No suspicious nodules. Centrilobular emphysema.  05/24/11- 82 year old female former smoker followed for history of right upper lobe cancer, COPD, allergic rhinitis. Here with husband Has had flu shot. She was feeling well until one week ago when she developed sore throat, temperature to 99.7, chills and malaise, cough productive of purulent blood-streaked sputum. Has home nebulizer being used only once daily with Xopenex. She denies swollen glands, fluid retention, GI upset or chest pain.  06/28/11- 82 year old female former smoker followed for history of right upper lobe cancer, COPD, allergic rhinitis. Here with husband Reports- wheezing, chest congestion, runny nose, cough with green/red mucus tinged with blood.  She got well after last visit and been sick again. Did have flu vaccine. Now 3 or 4 days of rhinorrhea, chills, productive cough with discolored sputum. 3 days ago had brief episode of pink in sputum with hard cough and. Cough and sputum production are less today. Occasional tussive soreness  at the xiphoid. Frequent heartburn. Doxycycline and seemed to help but is completed now. Does have home nebulizer.  Had chest x-ray January 7: *RADIOLOGY REPORT* 06/27/11- Clinical Data: 82 year old with hemoptysis.  CHEST - 2 VIEW  Comparison: Chest radiograph 05/24/2011  Findings: Chronic volume loss and postoperative changes in the  right hemithorax. The upper lungs are clear with increased  lucency. Findings suggest emphysematous changes. Stable linear  density at the right lung base is suggestive for scar. The heart  and mediastinum are stable. Slightly increased interstitial  densities in the right mid chest are similar to the prior  examination. No focal airspace disease.  IMPRESSION:  Stable chest radiograph findings. No acute changes.  Original Report Authenticated By: Markus Daft, M.D.   10/13/11- 82 year old female former smoker followed for history of right upper lobe cancer, COPD, allergic rhinitis. Here with husband Always has SOB, wheezing, cough, and congestion-has gotten worse due to pollen-eyes bothering her and runny nose(sneezing) Increased congestion in head and nose. She had had a pneumonia in December and then had fallen at home in January, bruising her face. Using nebulizer twice a day. Complains portable oxygen is too heavy and is thinking about [facility]  02/16/12- 82 year old female former smoker followed for history of right upper lobe NSCCa, COPD, allergic rhinitis. Here with husband Sore throat and tongue-? white spots started 2-3 days ago; last night had cough-productive. Recent cortisone shot by orthopedist. COPD assessment test (CAT) score 13/24 CXR 01/06/12-reviewed with her IMPRESSION:  Stable emphysematous change and chronic postsurgical change of the  right lung without definite acute cardiopulmonary disease.  Original Report Authenticated By:  Rachel Moulds, M.D.   10/15/12- 53year-old female former smoker followed for history of  right upper lobe NSCCa, COPD, allergic rhinitis.  FOLLOWS DEY:CXKGYJEHU wheezing today; ? pollen causing it. Did well through the winter. Using oxygen 2 L/[facility]. Productive cough after being outdoors a lot yesterday. Clear sputum, no fever.  03/04/13- 15year-old female former smoker followed for history of right upper lobe Squamous Cell, COPD, allergic rhinitis FOLLOWS FOR:  Increased SOB and coughing w/ yellowish mucus since 9/11 Trying to help husband who has NHL. She reports one-week increased cough, green sputum. We sent doxycycline-sputum turning yellow. No fever or GI upset. Nasal congestion without headache. 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 Postoperative changes at the right lung base.  2. No acute cardiopulmonary disease or evidence for recurrent  disease.  Original Report Authenticated By: San Morelle, M.D.  04/16/13- 87year-old female former smoker followed for history of right upper lobe NSCCa, COPD, allergic rhinitis Follows for- Pt states her problems are improving.  Pt c/o mild prod cough with yellowish sputum with activity. Less cough, some nasal congestion. Less dyspnea on exertion. 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 HR and BP were high. Did not go to ER.   patient describes cough, white sputum, blowing white from nose, headache, raw throat. Denies fever or myalgias except feels sore across front of chest from coughing. No GI  upset. CXR 12/31/12 IMPRESSION:  1. Postoperative changes at the right lung base.  2. No acute cardiopulmonary disease or evidence for recurrent  disease.  Original Report Authenticated By: San Morelle, M.D.  10/15/13- 30year-old female former smoker followed for history of right upper lobe NSCCa, COPD, allergic rhinitis, complicated by glaucoma   Family here    Husband has cancer FOLLOWS FOR:  Sinus and chest congestion with cough white mucus and sneezing x3 days Exacerbation cleared well with Avelox and that worried. Had been well until 3 days ago now-has sneezing, watery nose blamed on pollen. Damp weather increases cough and head congestion. Baseline is daily chronic productive cough with white sputum in the mornings. CXR 07/29/13 IMPRESSION:  Chronic volume loss and pleural thickening in the right hemithorax.  No acute chest findings.  Electronically Signed  By: Markus Daft M.D.  On: 07/26/2013 17:49  04/01/14- 50year-old female former smoker followed for history of right upper lobe NSCCa, COPD, allergic rhinitis, complicated by glaucoma    Husband here, has cancer ACUTE VISIT: cough-productive-pinkish/red in color; recently at ER, CP and upper back pain that went down her arm. SOB as well. D-dimer, CBC, troponin and BMET were unremarkable Pt describes chest congestion starting 4-5 days ago, then sore L scapula x 2 days, maybe low grade fever and chilly one day. Woke with pain leftr upper arm 2 days ago. Went to ER. Taking capsules of otc mucus thinner with red color- blames that for pink sputum and mild queasy. Some occasional wheeze. Tussive soreness R lower anterior costal margin. Achey all over "like flu". Has not had flu shot yet. Shoulder pain is  gone.  CXR 03/29/14 IMPRESSION:  Stable scarring on right with volume loss. Underlying emphysema. No  edema or consolidation. No appreciable change compared to prior  study.    06/30/2014 acute  ov/[facility] re: cough/ fever/ brown sputum   Chief Complaint  Patient presents with  . Acute Visit    Pt c/o increased cough for the past 2 days- prod with dark brown/red sputum.  She also c/o HA, body aches, chills and low grade temp.   acutely since 06/29/14 Usually use neb just once a day but increased to three times daily > comfortable at rest p neb  Has flutter valve  But not using   CXR PA and Lateral:   06/30/2014 :  1. Diffuse infiltrates with nodularity in right lung again noted. Findings most likely infectious including possible granulomatous infection . Associated small right pleural effusion.  03/05/15- 25year-old female former smoker followed for history of right upper lobe resection NSCCa, COPD, allergic rhinitis, complicated by glaucoma    Husband here, has cancer Follows For: pt states shes been having alot of congestion. pt hasd pnuemonia July 4th pt was admitted at [facility] for 4 days.  pt has had to use oxygen most of the time day and night. pt using 2LPN when using O2. pt c/o productive cough white in color. pt had xrays at [facility]. We reviewed chest x-ray showing old postoperative changes on the right with more recent pneumonia.  Using oxygen 2 L for sleep and when necessary/[facility]. Has Flutter device. Cough stays productive, usually white. No blood. Got flu vaccine from primary physician. Gives history of intolerance to oral antibiotics because of GI upset so she needs to get intravenous antibiotics if treated.    CXR 12/22/14 IMPRESSION: Acute on chronic disease at the right lung base. Findings concerning for an acute infectious or inflammatory process in the right lower lung. Electronically Signed  By: Markus Daft M.D.  On: 12/22/2014 10:47    06/04/2015-82 year old female former smoker followed for history of right upper lobe resection/NSCCA, COPD, allergic rhinitis, complicated by glaucoma O2 2 L sleep, [facility]ion/ [facility]      flu vaccine UTD   husband here O2 sat sometimes drops  when she is on long hose at home. Discussed turning it up to 3 when that happens. Feeling better since last prednisone taper 4 days ago. Occasional cramp right lower anterior rib cage. Occasional aching left arm and hand cramp, not exertional. No palpitation. Discussed chest x-ray image CXR 05/25/2015 IMPRESSION: 1. Stable right base postsurgical change with pleural parenchymal scarring. 2.Stable right mid lung field tiny pulmonary nodule. Electronically Signed: By: Marcello Moores Register On: 05/25/2015 16:53  06/11/2015-82 year old female former smoker followed for history right upper lobe resection/And SCCA, COPD, allergic rhinitis, consultative by glaucoma O2 3 L sleep, exertion/changd to [facility]     flu vaccine UTD    Pt c/o body aches, diarrhea x 1 day, chest tightness, CP, increased cough with green/yellow mucus production. Denies fever.   07/06/2015-82 year old female former smoker followed for history right upper lobe resection/NSCCa, COPD, allergic rhinitis, complicated by glaucoma O2 2 L sleep, exertion/[employer]  contiunous FOLLOWS FOR: Pt states she continues to have cough-productive-white and thick. Pt had to change DME for O2([employer] now) and would like to see a about getting an "oxy go" concentrator if possible. Room air oximetry at rest 90%. Oximetry walk test 07/06/2015-desaturation to 85% while walking on room air. Saturation sustained at 97% on 2 L prongs. Says at home using oxygen almost  continuously-concentrator and portable tanks which are too heavy for her. With ambulation on room air in the home she desaturates to 86% within a few steps. Cough productive white with increased wheeze in the last 2 weeks  ROS-see HPI   Negative unless "+" Constitutional:    weight loss, night sweats, fevers, chills, fatigue, lassitude. HEENT:    headaches, difficulty swallowing, tooth/dental problems, sore throat,       sneezing, itching, ear ache, nasal congestion, post nasal drip,  snoring CV:    + chest pain, orthopnea, PND, swelling in lower extremities, anasarca,                                                        dizziness, palpitations Resp:   + shortness of breath with exertion or at rest.               + productive cough,   non-productive cough, coughing up of blood.              change in color of mucus.  wheezing.   Skin:    rash or lesions. GI:  No-   heartburn, indigestion, abdominal pain, nausea, vomiting,  GU:  MS:   joint pain, stiffness, . Neuro-     nothing unusual Psych:  change in mood or affect.  depression or anxiety.   memory loss.   Objective:   OBJ- Physical Exam General- Alert, Oriented, Affect-appropriate, Distress- none acute,                  saturation 94% room air at rest (left oxygen in waiting room) Skin- rash-none, lesions- none, excoriation- none Lymphadenopathy- none Head- atraumatic            Eyes- Gross vision intact, PERRLA, conjunctivae and secretions clear            Ears- Hearing, canals-normal            Nose- Clear, no-Septal dev, mucus, polyps, erosion, perforation             Throat- Mallampati II , mucosa clear , drainage- none, tonsils- atrophic Neck- flexible , trachea midline, no stridor , thyroid nl, carotid no bruit Chest - symmetrical excursion , unlabored           Heart/CV- RRR , no murmur , no gallop  , no rub, nl s1 s2                           - JVD- none , edema- none, stasis changes- none, varices- none           Lung-  wheeze + bilateral, cough- none , dullness-none, rub- none           Chest wall-  Abd-  Br/ Gen/ Rectal- Not done, not indicated Extrem- cyanosis- none, clubbing, none, atrophy- none, strength- nl Neuro- grossly intact to observation

## 2015-07-06 NOTE — Assessment & Plan Note (Signed)
We reconfirmed her need for supplemental oxygen during exertion, with walk test today. She continues oxygen by concentrator for sleep and with activity in the home Plan-[employer], evaluate for portable oxygen concentrator

## 2015-07-18 DIAGNOSIS — J9611 Chronic respiratory failure with hypoxia: Secondary | ICD-10-CM | POA: Diagnosis not present

## 2015-07-18 DIAGNOSIS — J449 Chronic obstructive pulmonary disease, unspecified: Secondary | ICD-10-CM | POA: Diagnosis not present

## 2015-07-24 ENCOUNTER — Other Ambulatory Visit: Payer: Self-pay | Admitting: Internal Medicine

## 2015-07-26 IMAGING — CR DG CHEST 2V
2 series · 2 of 2 positions shown · non-contrast
Comparison: 07/26/2013

CLINICAL DATA: History of resected lung cancer

EXAM:
CHEST  2 VIEW

[w chest pa]
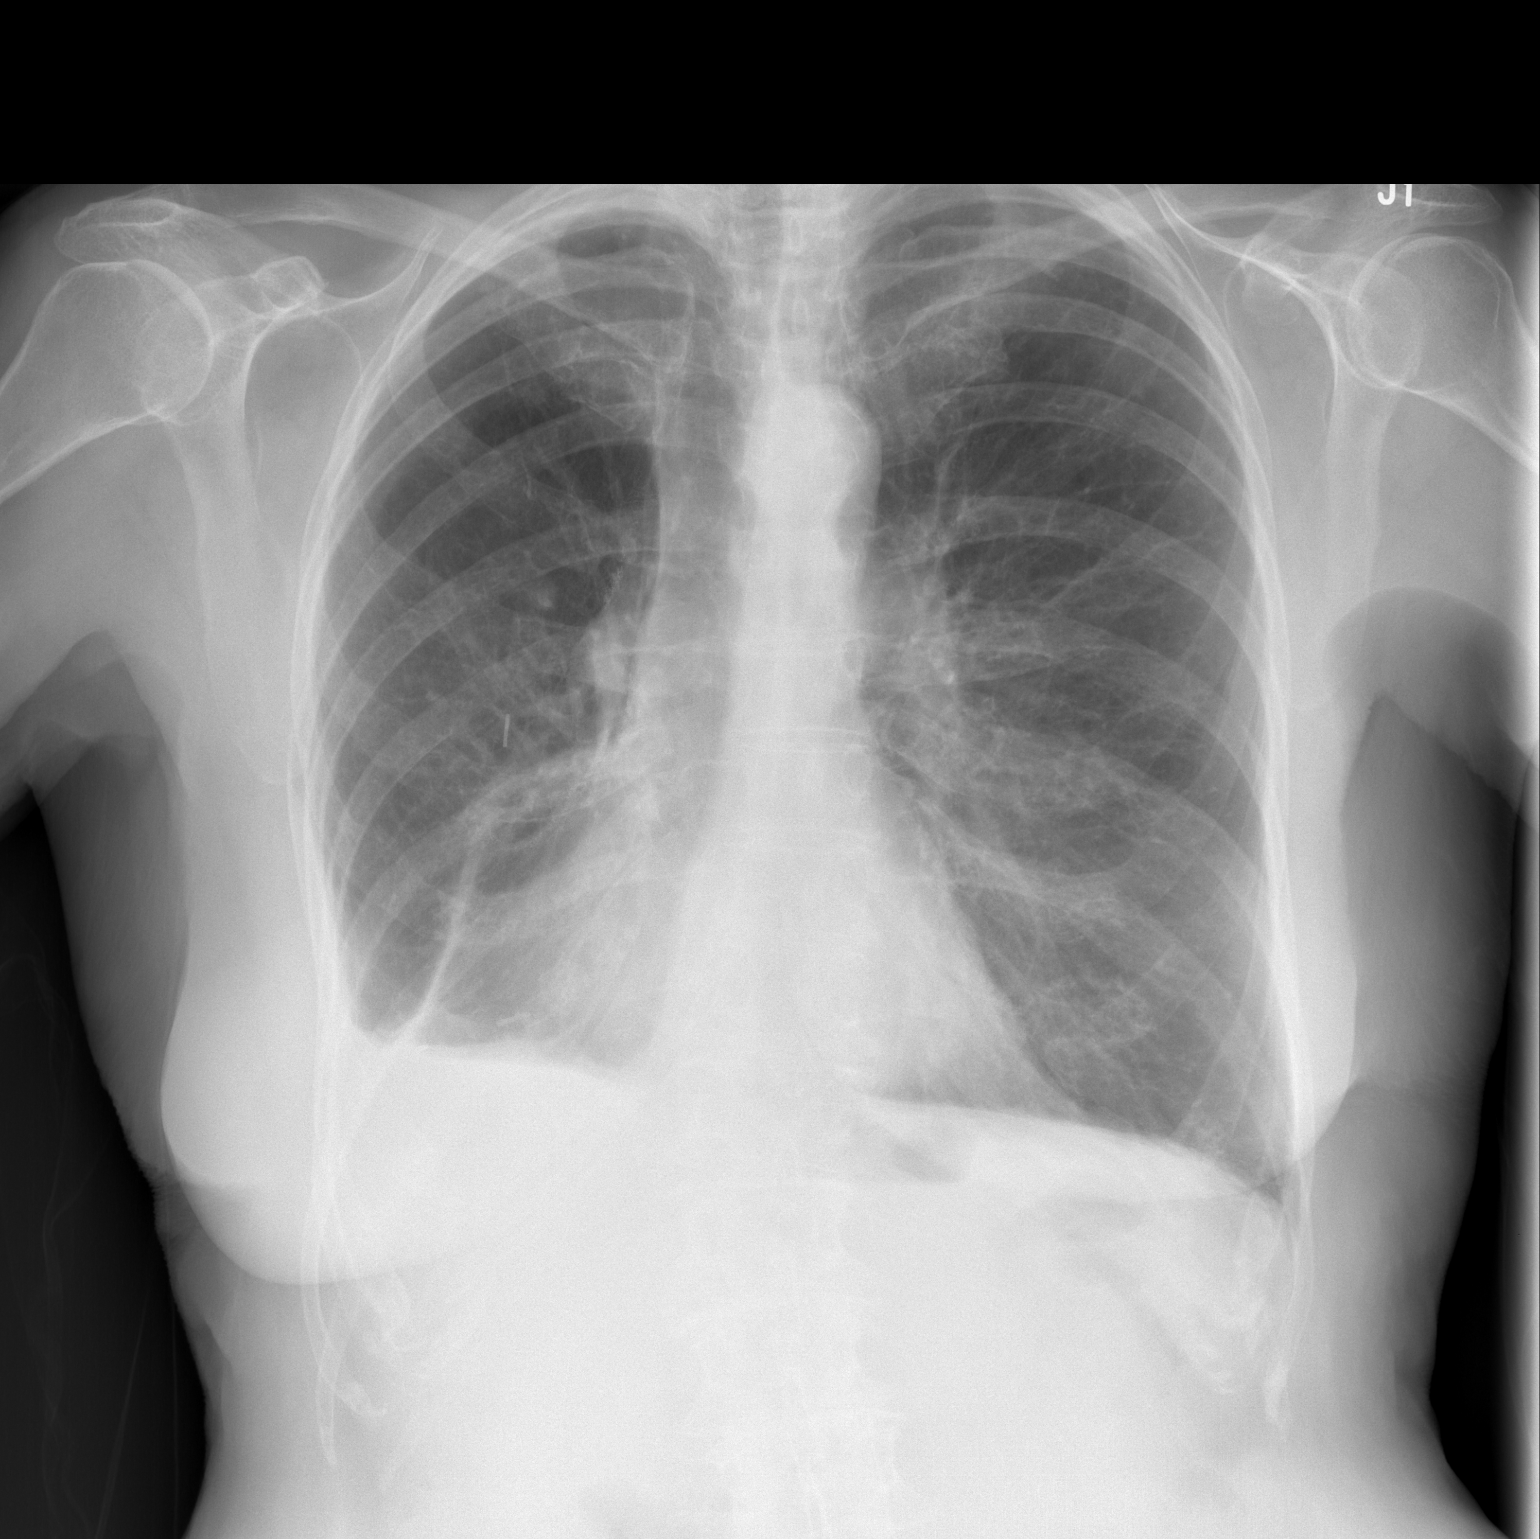

[w chest lat]
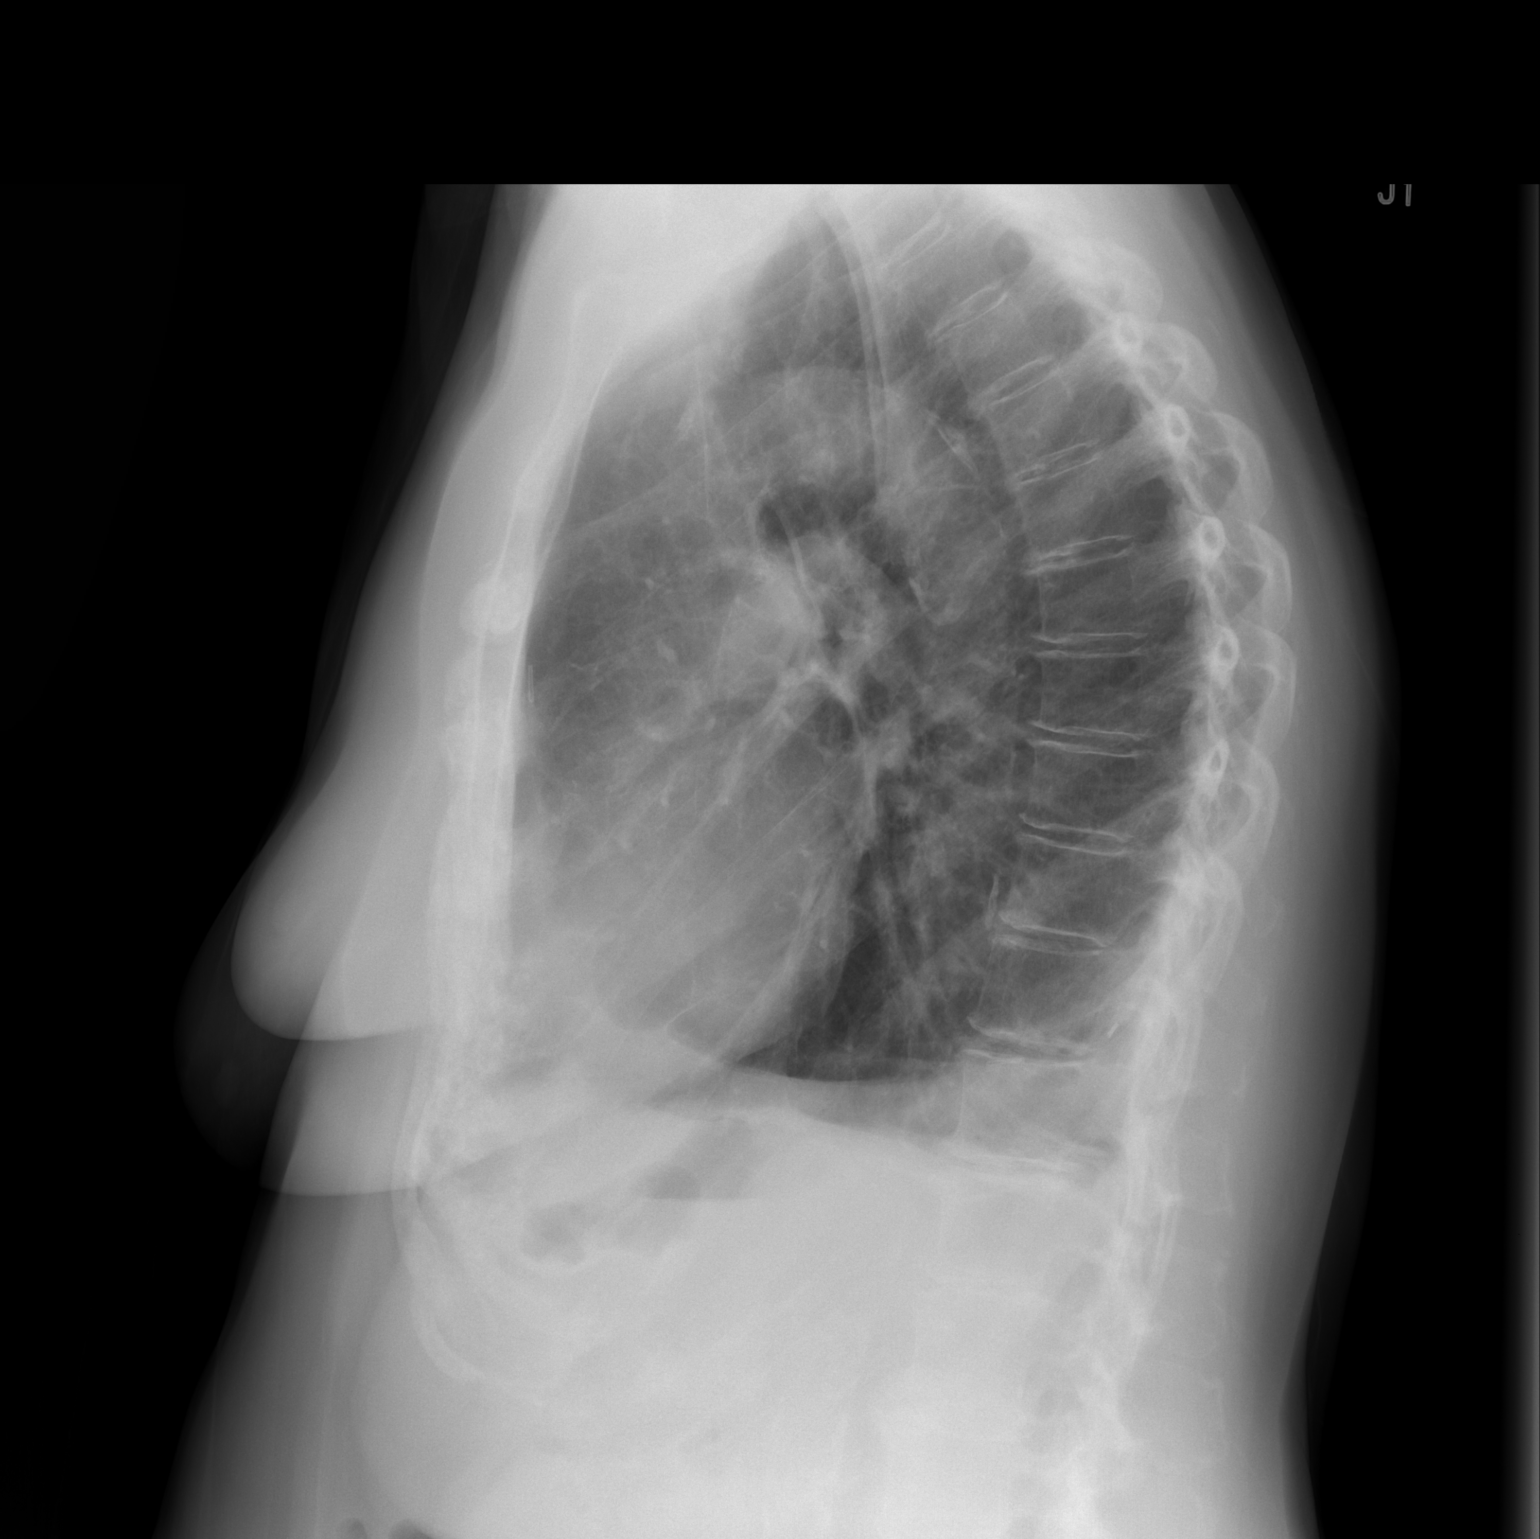

[2 of 2 positions shown; findings below may reference images not displayed]

FINDINGS: Scarring is again noted in the right lung base. Mild volume loss is
noted in the right lung the left lung is well aerated and clear. No
acute bony abnormality is seen. The cardiac shadow is within normal
limits.
IMPRESSION: No change from the prior exam.  No acute abnormality seen.

## 2015-07-31 ENCOUNTER — Ambulatory Visit: Payer: Medicare Other | Admitting: Internal Medicine

## 2015-08-18 DIAGNOSIS — J449 Chronic obstructive pulmonary disease, unspecified: Secondary | ICD-10-CM | POA: Diagnosis not present

## 2015-08-18 DIAGNOSIS — J9611 Chronic respiratory failure with hypoxia: Secondary | ICD-10-CM | POA: Diagnosis not present

## 2015-08-20 ENCOUNTER — Other Ambulatory Visit: Payer: Self-pay | Admitting: Internal Medicine

## 2015-08-21 NOTE — Telephone Encounter (Signed)
Notified pt rx fax to [facility].../lmb

## 2015-08-21 NOTE — Telephone Encounter (Signed)
rx printed.  Should be seen before her next refill ideally

## 2015-08-21 NOTE — Telephone Encounter (Signed)
Received call pt states she is needing a refill on her alprazolam she will be out onf meds on Sunday. Schedule to see Dr. Quay Burow 12/02/15, wanting to see if she will on refill...[name]

## 2015-08-27 ENCOUNTER — Other Ambulatory Visit: Payer: Self-pay | Admitting: Internal Medicine

## 2015-08-28 ENCOUNTER — Telehealth: Payer: Self-pay | Admitting: Internal Medicine

## 2015-08-28 MED ORDER — LEVALBUTEROL HCL 1.25 MG/3ML IN NEBU: INHALATION_SOLUTION | RESPIRATORY_TRACT | Status: DC

## 2015-08-28 NOTE — Telephone Encounter (Signed)
Pt states that she needs a refills of Xop neb sent to [facility] Rx sent with #11m per patient request. Nothing further needed.

## 2015-09-15 DIAGNOSIS — J449 Chronic obstructive pulmonary disease, unspecified: Secondary | ICD-10-CM | POA: Diagnosis not present

## 2015-09-15 DIAGNOSIS — J9611 Chronic respiratory failure with hypoxia: Secondary | ICD-10-CM | POA: Diagnosis not present

## 2015-10-08 ENCOUNTER — Ambulatory Visit (INDEPENDENT_AMBULATORY_CARE_PROVIDER_SITE_OTHER): Payer: PPO | Admitting: Internal Medicine

## 2015-10-08 ENCOUNTER — Encounter: Payer: Self-pay | Admitting: Internal Medicine

## 2015-10-08 VITALS — BP 124/80 | HR 79 | Ht 66.0 in | Wt 156.2 lb

## 2015-10-08 DIAGNOSIS — J9611 Chronic respiratory failure with hypoxia: Secondary | ICD-10-CM | POA: Diagnosis not present

## 2015-10-08 DIAGNOSIS — J449 Chronic obstructive pulmonary disease, unspecified: Secondary | ICD-10-CM | POA: Diagnosis not present

## 2015-10-08 MED ORDER — PREDNISONE 10 MG PO TABS: ORAL_TABLET | ORAL | Status: DC

## 2015-10-08 NOTE — Progress Notes (Signed)
Patient ID: [name], female    DOB: 1932/07/18, 82 y.o.   MRN: [id number]  HPI 816/12-82 year old female former smoker followed for history of right upper lobe cancer, COPD, allergic rhinitis  Here with husband Last here -August 05, 2010- note reviewed Increased  coughing 2 weeks, but struggling all summer. Better at beach. Here few days ago to see Dr Ronnald Ramp.   Had significantly productive cough- purulent, sometimes  with black spots. Uses nebulizer 2-3x/day, O2 2l/ [employer]. On Avelox now with Symbicort from Dr Ronnald Ramp. Mouth is getting sore. Still actively wheezing and short of breath.  CXR December 07, 2010- clear except old surgical changes.  She is not sure if she began this flare with cold or allergy. Low fever once or twice in past month. CT w/cm- 12/21/10- normal heart size coronary calcification. Prior right upper lobectomy. Stable right millimeter nodule at right minor fissure. No suspicious nodules. Centrilobular emphysema.  05/24/11- 82 year old female former smoker followed for history of right upper lobe cancer, COPD, allergic rhinitis. Here with husband Has had flu shot. She was feeling well until one week ago when she developed sore throat, temperature to 99.7, chills and malaise, cough productive of purulent blood-streaked sputum. Has home nebulizer being used only once daily with Xopenex. She denies swollen glands, fluid retention, GI upset or chest pain.  06/28/11- 82 year old female former smoker followed for history of right upper lobe cancer, COPD, allergic rhinitis. Here with husband Reports- wheezing, chest congestion, runny nose, cough with green/red mucus tinged with blood.  She got well after last visit and been sick again. Did have flu vaccine. Now 3 or 4 days of rhinorrhea, chills, productive cough with discolored sputum. 3 days ago had brief episode of pink in sputum with hard cough and. Cough and sputum production are less today. Occasional tussive soreness  at the xiphoid. Frequent heartburn. Doxycycline and seemed to help but is completed now. Does have home nebulizer.  Had chest x-ray January 7: *RADIOLOGY REPORT* 06/27/11- Clinical Data: 82 year old with hemoptysis.  CHEST - 2 VIEW  Comparison: Chest radiograph 05/24/2011  Findings: Chronic volume loss and postoperative changes in the  right hemithorax. The upper lungs are clear with increased  lucency. Findings suggest emphysematous changes. Stable linear  density at the right lung base is suggestive for scar. The heart  and mediastinum are stable. Slightly increased interstitial  densities in the right mid chest are similar to the prior  examination. No focal airspace disease.  IMPRESSION:  Stable chest radiograph findings. No acute changes.  Original Report Authenticated By: Markus Daft, M.D.   10/13/11- 82 year old female former smoker followed for history of right upper lobe cancer, COPD, allergic rhinitis. Here with husband Always has SOB, wheezing, cough, and congestion-has gotten worse due to pollen-eyes bothering her and runny nose(sneezing) Increased congestion in head and nose. She had had a pneumonia in December and then had fallen at home in January, bruising her face. Using nebulizer twice a day. Complains portable oxygen is too heavy and is thinking about [facility]  02/16/12- 82 year old female former smoker followed for history of right upper lobe NSCCa, COPD, allergic rhinitis. Here with husband Sore throat and tongue-? white spots started 2-3 days ago; last night had cough-productive. Recent cortisone shot by orthopedist. COPD assessment test (CAT) score 13/24 CXR 01/06/12-reviewed with her IMPRESSION:  Stable emphysematous change and chronic postsurgical change of the  right lung without definite acute cardiopulmonary disease.  Original Report Authenticated By:  Rachel Moulds, M.D.   10/15/12- 53year-old female former smoker followed for history of  right upper lobe NSCCa, COPD, allergic rhinitis.  FOLLOWS DEY:CXKGYJEHU wheezing today; ? pollen causing it. Did well through the winter. Using oxygen 2 L/[facility]. Productive cough after being outdoors a lot yesterday. Clear sputum, no fever.  03/04/13- 15year-old female former smoker followed for history of right upper lobe Squamous Cell, COPD, allergic rhinitis FOLLOWS FOR:  Increased SOB and coughing w/ yellowish mucus since 9/11 Trying to help husband who has NHL. She reports one-week increased cough, green sputum. We sent doxycycline-sputum turning yellow. No fever or GI upset. Nasal congestion without headache. 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 Postoperative changes at the right lung base.  2. No acute cardiopulmonary disease or evidence for recurrent  disease.  Original Report Authenticated By: San Morelle, M.D.  04/16/13- 87year-old female former smoker followed for history of right upper lobe NSCCa, COPD, allergic rhinitis Follows for- Pt states her problems are improving.  Pt c/o mild prod cough with yellowish sputum with activity. Less cough, some nasal congestion. Less dyspnea on exertion. 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 HR and BP were high. Did not go to ER.   patient describes cough, white sputum, blowing white from nose, headache, raw throat. Denies fever or myalgias except feels sore across front of chest from coughing. No GI  upset. CXR 12/31/12 IMPRESSION:  1. Postoperative changes at the right lung base.  2. No acute cardiopulmonary disease or evidence for recurrent  disease.  Original Report Authenticated By: San Morelle, M.D.  10/15/13- 30year-old female former smoker followed for history of right upper lobe NSCCa, COPD, allergic rhinitis, complicated by glaucoma   Family here    Husband has cancer FOLLOWS FOR:  Sinus and chest congestion with cough white mucus and sneezing x3 days Exacerbation cleared well with Avelox and that worried. Had been well until 3 days ago now-has sneezing, watery nose blamed on pollen. Damp weather increases cough and head congestion. Baseline is daily chronic productive cough with white sputum in the mornings. CXR 07/29/13 IMPRESSION:  Chronic volume loss and pleural thickening in the right hemithorax.  No acute chest findings.  Electronically Signed  By: Markus Daft M.D.  On: 07/26/2013 17:49  04/01/14- 50year-old female former smoker followed for history of right upper lobe NSCCa, COPD, allergic rhinitis, complicated by glaucoma    Husband here, has cancer ACUTE VISIT: cough-productive-pinkish/red in color; recently at ER, CP and upper back pain that went down her arm. SOB as well. D-dimer, CBC, troponin and BMET were unremarkable Pt describes chest congestion starting 4-5 days ago, then sore L scapula x 2 days, maybe low grade fever and chilly one day. Woke with pain leftr upper arm 2 days ago. Went to ER. Taking capsules of otc mucus thinner with red color- blames that for pink sputum and mild queasy. Some occasional wheeze. Tussive soreness R lower anterior costal margin. Achey all over "like flu". Has not had flu shot yet. Shoulder pain is  gone.  CXR 03/29/14 IMPRESSION:  Stable scarring on right with volume loss. Underlying emphysema. No  edema or consolidation. No appreciable change compared to prior  study.    06/30/2014 acute  ov/[facility] re: cough/ fever/ brown sputum   Chief Complaint  Patient presents with  . Acute Visit    Pt c/o increased cough for the past 2 days- prod with dark brown/red sputum.  She also c/o HA, body aches, chills and low grade temp.   acutely since 06/29/14 Usually use neb just once a day but increased to three times daily > comfortable at rest p neb  Has flutter valve  But not using   CXR PA and Lateral:   06/30/2014 :  1. Diffuse infiltrates with nodularity in right lung again noted. Findings most likely infectious including possible granulomatous infection . Associated small right pleural effusion.  03/05/15- 71year-old female former smoker followed for history of right upper lobe resection NSCCa, COPD, allergic rhinitis, complicated by glaucoma    Husband here, has cancer Follows For: pt states shes been having alot of congestion. pt hasd pnuemonia July 4th pt was admitted at [facility] for 4 days.  pt has had to use oxygen most of the time day and night. pt using 2LPN when using O2. pt c/o productive cough white in color. pt had xrays at [facility]. We reviewed chest x-ray showing old postoperative changes on the right with more recent pneumonia.  Using oxygen 2 L for sleep and when necessary/[facility]. Has Flutter device. Cough stays productive, usually white. No blood. Got flu vaccine from primary physician. Gives history of intolerance to oral antibiotics because of GI upset so she needs to get intravenous antibiotics if treated.    CXR 12/22/14 IMPRESSION: Acute on chronic disease at the right lung base. Findings concerning for an acute infectious or inflammatory process in the right lower lung. Electronically Signed  By: Markus Daft M.D.  On: 12/22/2014 10:47    06/04/2015-82 year old female former smoker followed for history of right upper lobe resection/NSCCA, COPD, allergic rhinitis, complicated by glaucoma O2 2 L sleep, exertion/[facility]      flu vaccine UTD   husband here O2 sat sometimes drops  when she is on long hose at home. Discussed turning it up to 3 when that happens. Feeling better since last prednisone taper 4 days ago. Occasional cramp right lower anterior rib cage. Occasional aching left arm and hand cramp, not exertional. No palpitation. Discussed chest x-ray image CXR 05/25/2015 IMPRESSION: 1. Stable right base postsurgical change with pleural parenchymal scarring. 2.Stable right mid lung field tiny pulmonary nodule. Electronically Signed: By: Marcello Moores Register On: 05/25/2015 16:53  06/11/2015-82 year old female former smoker followed for history right upper lobe resection/And SCCA, COPD, allergic rhinitis, consultative by glaucoma O2 3 L sleep, exertion/changd to [employer]     flu vaccine UTD    Pt c/o body aches, diarrhea x 1 day, chest tightness, CP, increased cough with green/yellow mucus production. Denies fever.   07/06/2015-82 year old female former smoker followed for history right upper lobe resection/NSCCa, COPD, allergic rhinitis, complicated by glaucoma O2 2 L sleep, exertion/[employer]  contiunous FOLLOWS FOR: Pt states she continues to have cough-productive-white and thick. Pt had to change DME for O2([employer] now) and would like to see a about getting an "oxy go" concentrator if possible. Room air oximetry at rest 90%. Oximetry walk test 07/06/2015-desaturation to 85% while walking on room air. Saturation sustained at 97% on 2 L prongs. Says at home using oxygen almost  continuously-concentrator and portable tanks which are too heavy for her. With ambulation on room air in the home she desaturates to 86% within a few steps. Cough productive white with increased wheeze in the last 2 weeks  10/08/2015-82 year old female former smoker followed for history right upper lobe resection/NSSCA, COPD, allergic rhinitis, complicated by glaucoma                  Husband here O2 2 L/[employer] FOLLOWS FOR: Pt states her lungs feel full and gets worse x 1 week. SOB and  wheezing as well. Coughing-productive-thick and white in color. Spells of worse coughing with productive thick white sputum. She is using her medicines. Does not feel she has had a cold. Some dependent ankle edema. Occasional nonexertional mid chest pain seems tussive by description.  ROS-see HPI   Negative unless "+" Constitutional:    weight loss, night sweats, fevers, chills, fatigue, lassitude. HEENT:    headaches, difficulty swallowing, tooth/dental problems, sore throat,       sneezing, itching, ear ache, nasal congestion, post nasal drip, snoring CV:    + chest pain, orthopnea, PND, swelling in lower extremities, anasarca,                                                        dizziness, palpitations Resp:   + shortness of breath with exertion or at rest.               + productive cough,   non-productive cough, coughing up of blood.              change in color of mucus.  wheezing.   Skin:    rash or lesions. GI:  No-   heartburn, indigestion, abdominal pain, nausea, vomiting,  GU:  MS:   joint pain, stiffness, . Neuro-     nothing unusual Psych:  change in mood or affect.  depression or anxiety.   memory loss.   Objective:   OBJ- Physical Exam General- Alert, Oriented, Affect-appropriate, Distress- none acute, + Wearing O2 2 L                 saturation 94% room air at rest (left oxygen in waiting room) Skin- rash-none, lesions- none, excoriation- none Lymphadenopathy- none Head- atraumatic            Eyes- Gross vision intact, PERRLA, conjunctivae and secretions clear            Ears- Hearing, canals-normal            Nose- Clear, no-Septal dev, mucus, polyps, erosion, perforation             Throat- Mallampati II , mucosa clear , drainage- none, tonsils- atrophic Neck- flexible , trachea midline, no stridor , thyroid nl, carotid no bruit Chest - symmetrical excursion , unlabored           Heart/CV- RRR , no murmur , no gallop  , no rub, nl s1 s2                            - JVD- none , edema- none, stasis changes- none, varices- none           Lung-  wheeze + bilateral, cough + harsh , dullness-none, rub-  none           Chest wall-  Abd-  Br/ Gen/ Rectal- Not done, not indicated Extrem- cyanosis- none, clubbing, none, atrophy- none, strength- nl Neuro- grossly intact to observation

## 2015-10-08 NOTE — Patient Instructions (Addendum)
Prednisone script sent    We will try 2 daily x 4 days, then one daily till you run out

## 2015-10-09 NOTE — Assessment & Plan Note (Signed)
Acute asthmatic bronchitis exacerbation Plan-add prednisone as discussed, stretching it out as a maintenance low-dose through the rest of pollen season to see if that helps.

## 2015-10-09 NOTE — Assessment & Plan Note (Signed)
Remains oxygen dependent. Her DME did not feel she oxygenated well enough to qualify for a portable oxygen concentrator at the time she was assessed.

## 2015-10-16 DIAGNOSIS — J449 Chronic obstructive pulmonary disease, unspecified: Secondary | ICD-10-CM | POA: Diagnosis not present

## 2015-10-16 DIAGNOSIS — J9611 Chronic respiratory failure with hypoxia: Secondary | ICD-10-CM | POA: Diagnosis not present

## 2015-10-22 IMAGING — CR DG CHEST 2V
2 series · 2 of 2 positions shown · non-contrast
Comparison: December 31, 2013

CLINICAL DATA: Chest pain with shortness of breath and cough.
Previous surgery on the right for lung carcinoma.

EXAM:
CHEST  2 VIEW

[w chest pa]
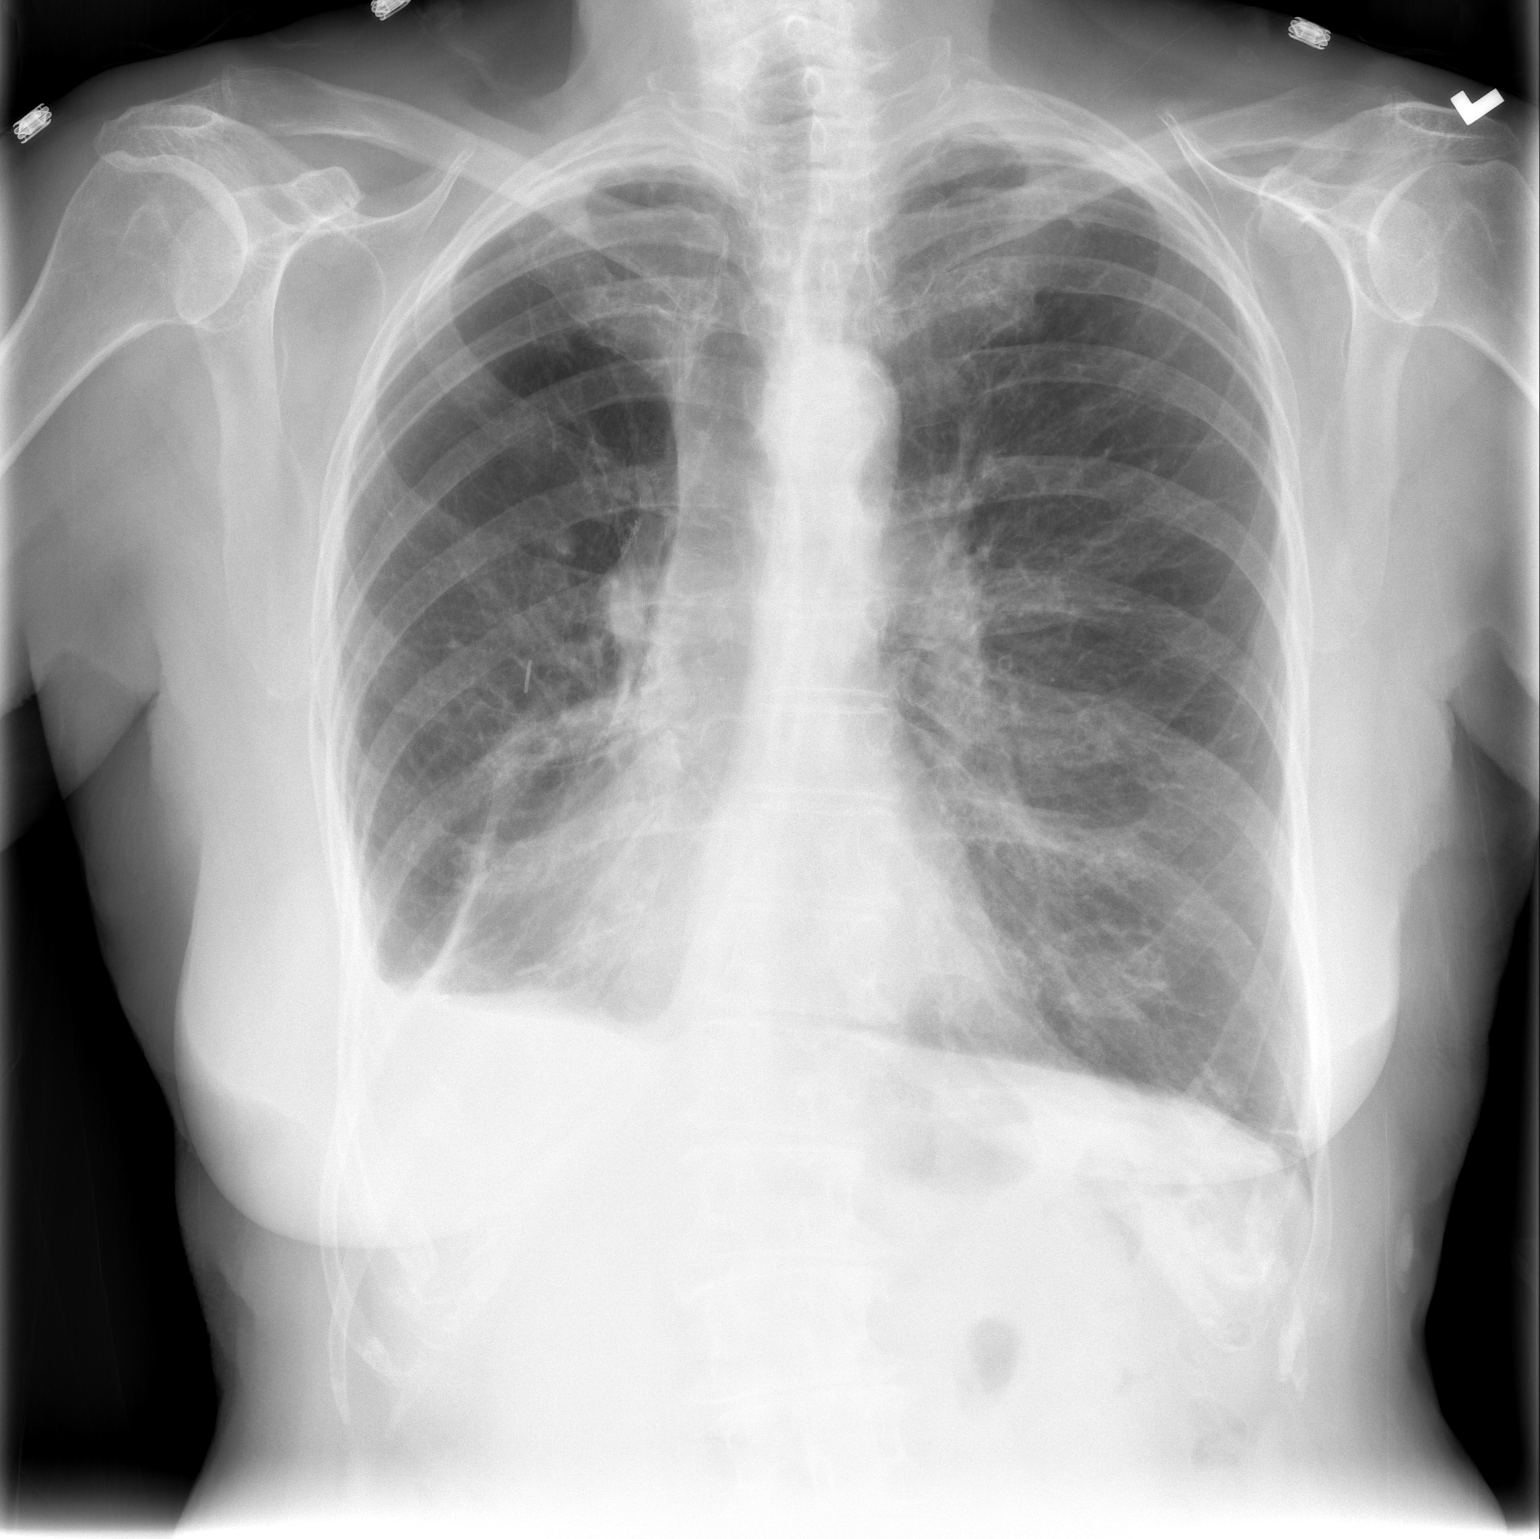

[w chest lat]
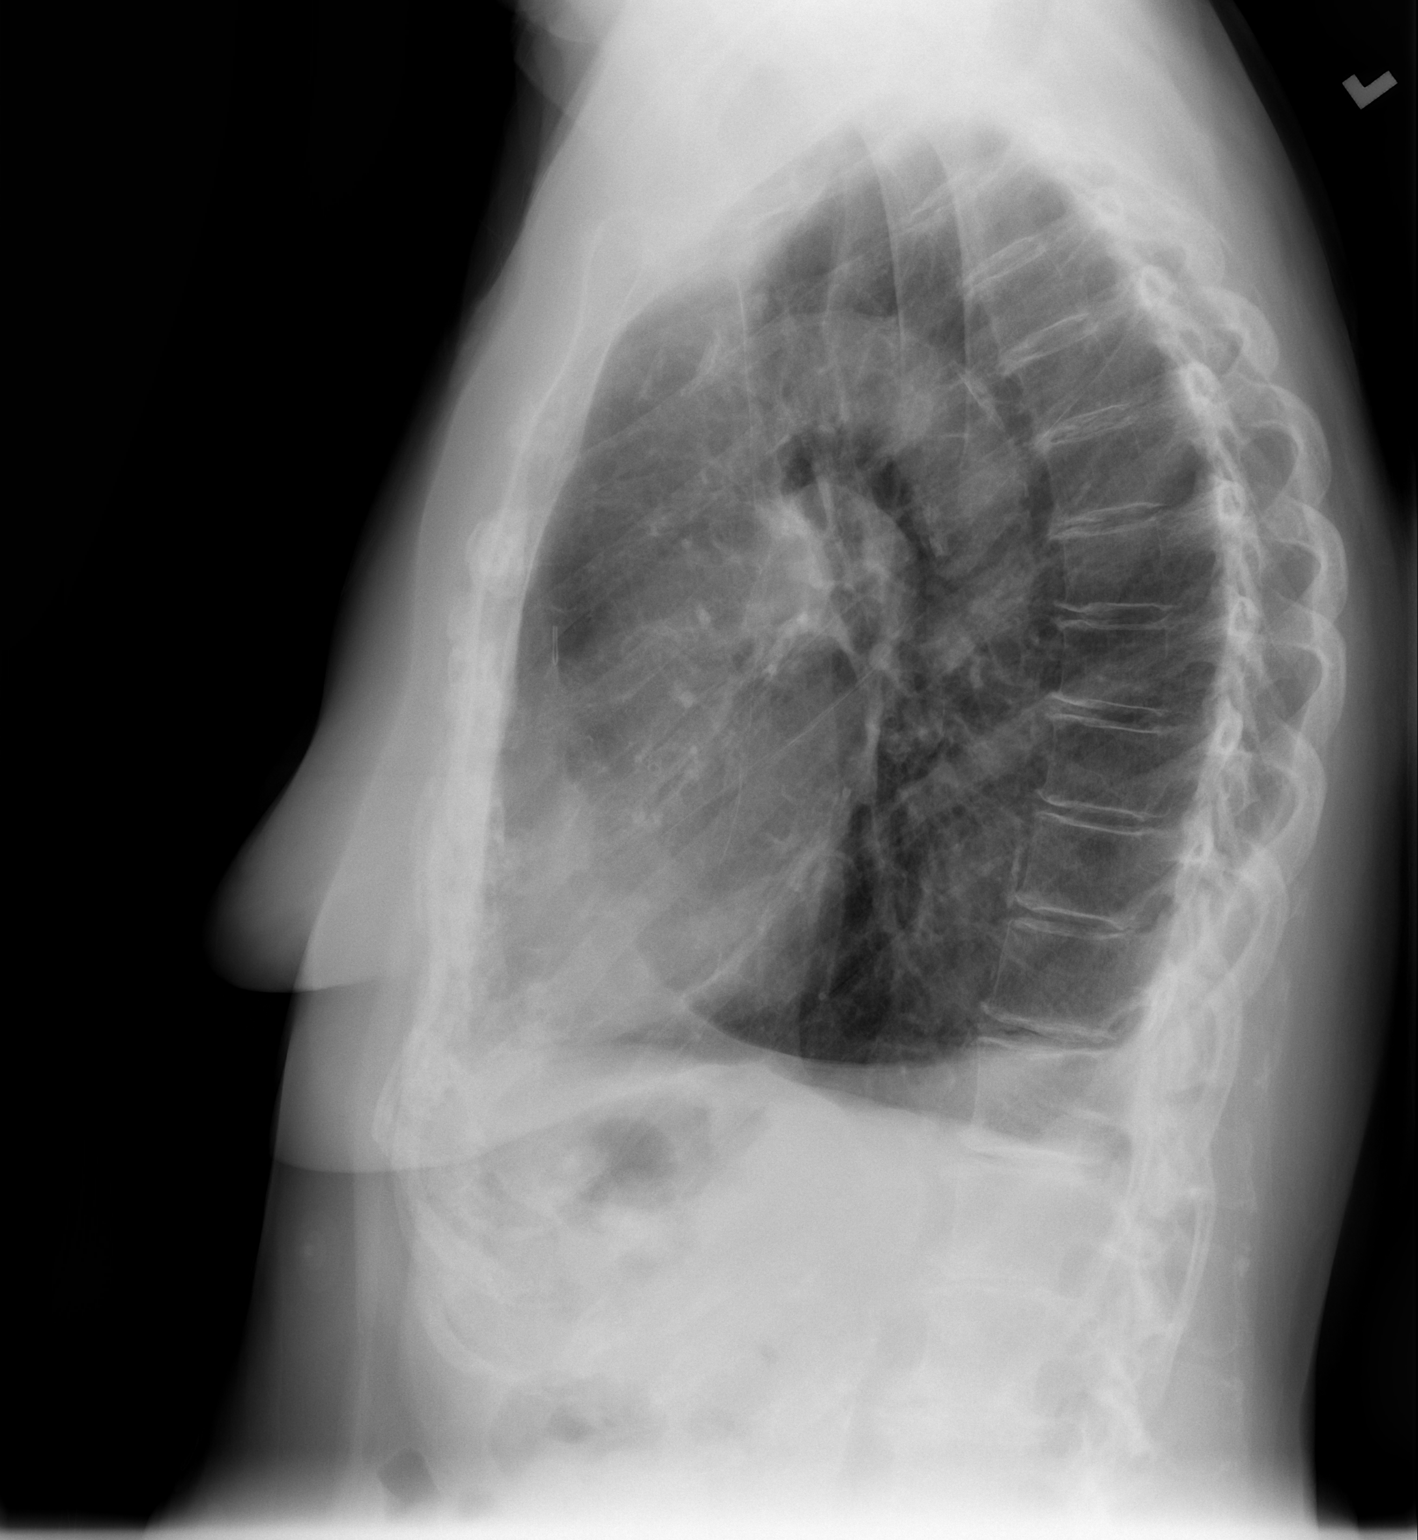

[2 of 2 positions shown; findings below may reference images not displayed]

FINDINGS: There is stable scarring with volume loss on the right. There is
underlying emphysema. There is no edema or consolidation. The heart
size and pulmonary vascularity are normal. No adenopathy. No bone
lesions.
IMPRESSION: Stable scarring on right with volume loss. Underlying emphysema. No
edema or consolidation. No appreciable change compared to prior
study.

## 2015-11-01 IMAGING — CT CT CHEST W/ CM
2 of 4 series · 15 of 36 positions shown, 18 images · IV contrast (omnipaque)
Comparison: Chest CT 12/21/2010.

CLINICAL DATA: 72-year-old female with history of lung cancer
status post right upper lobectomy presenting with shortness of
breath and hemoptysis.

EXAM:
CT CHEST WITH CONTRAST
TECHNIQUE: Multidetector CT imaging of the chest was performed during
intravenous contrast administration.
CONTRAST:  80mL OMNIPAQUE IOHEXOL 300 MG/ML  SOLN

[Series 2: chest routine with · axial · 0.60mm/px · z∈[-316,-50]mm · 12 of 63 slices shown, 15 images]
[im 5/63  mediastinal]
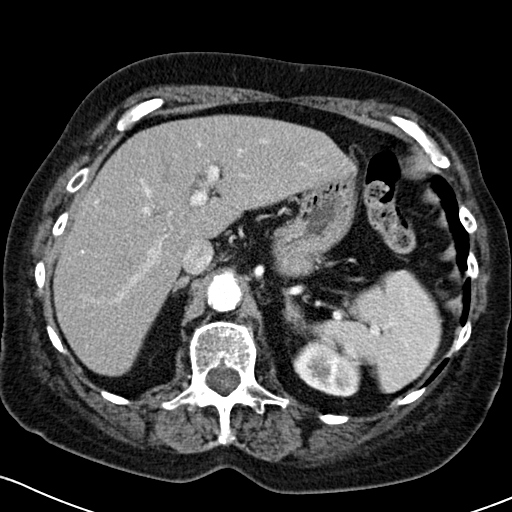
[im 5/63  lung]
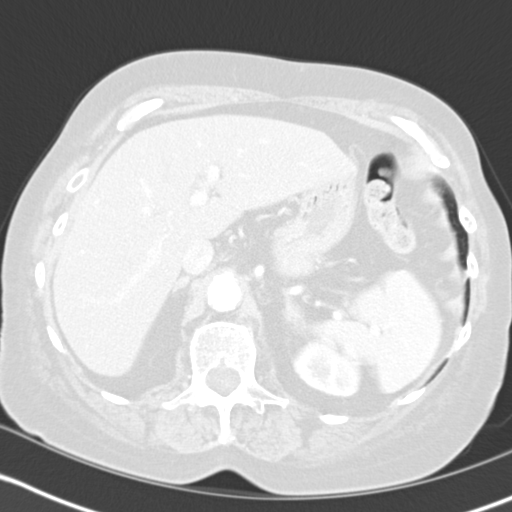
[im 10/63  lung]
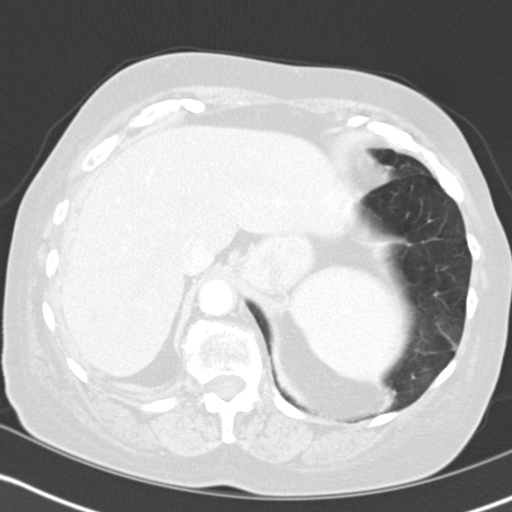
[im 15/63  lung]
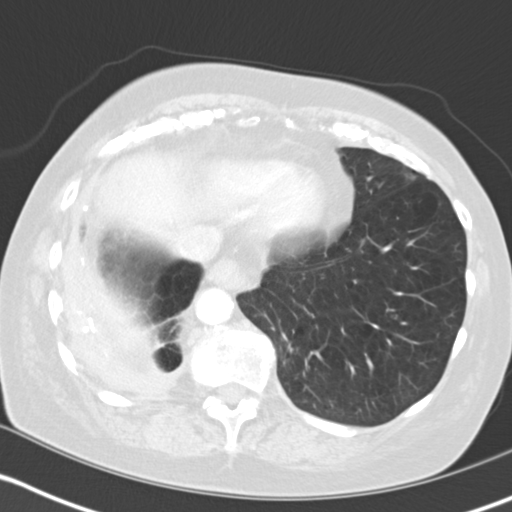
[im 20/63  lung]
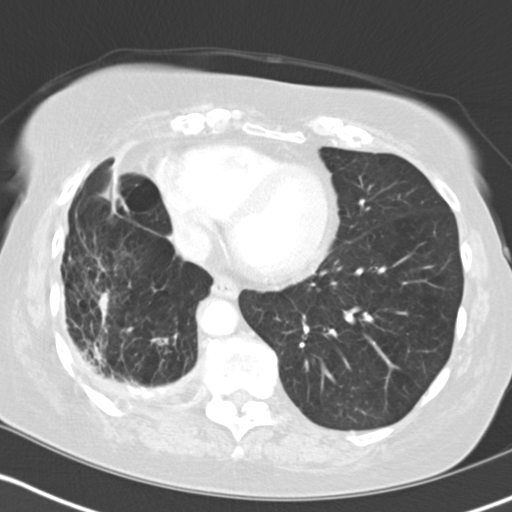
[im 24/63  mediastinal]
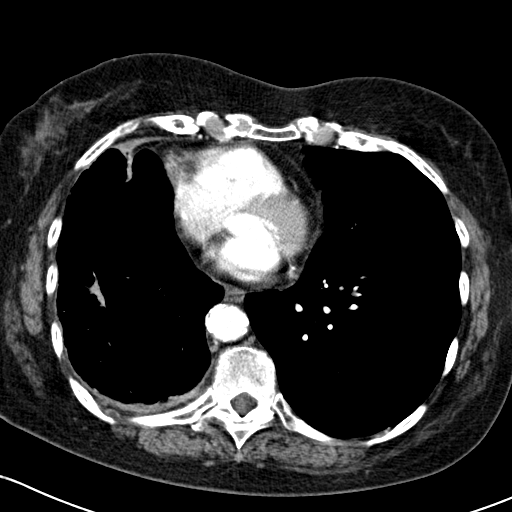
[im 24/63  lung]
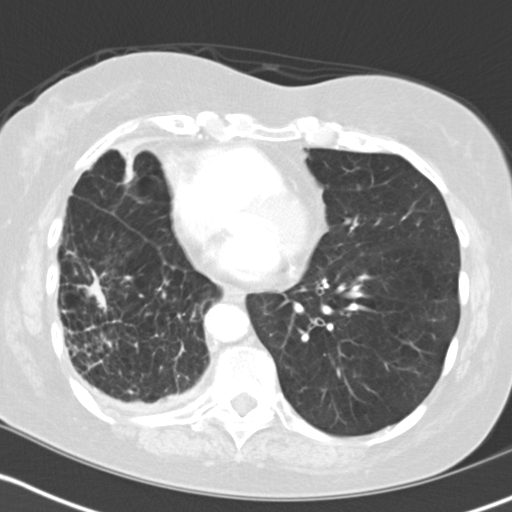
[im 29/63  lung]
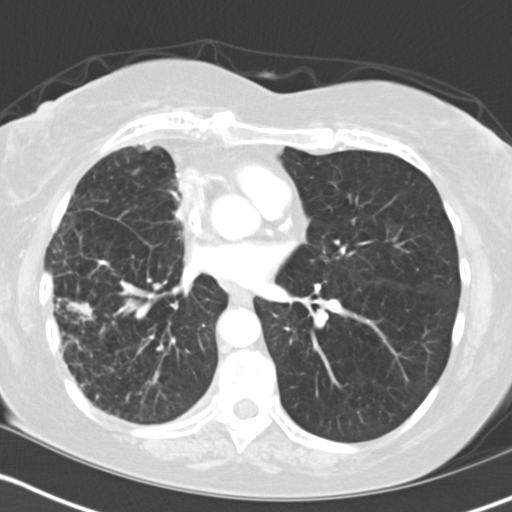
[im 34/63  lung]
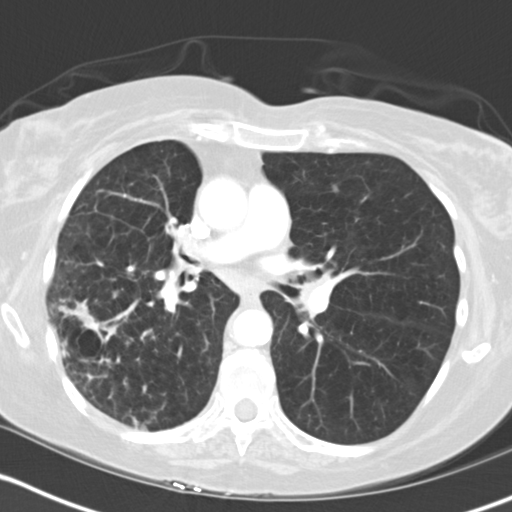
[im 39/63  lung]
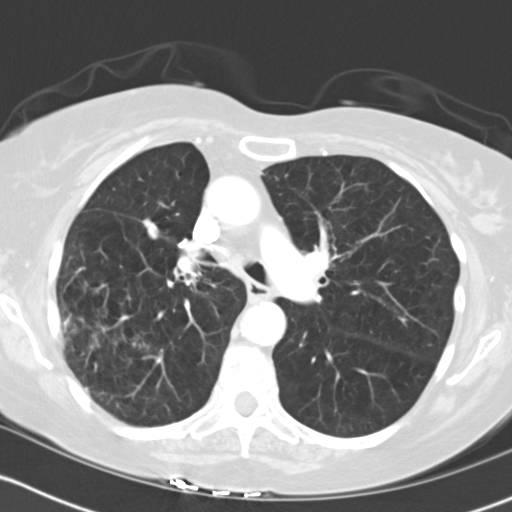
[im 43/63  mediastinal]
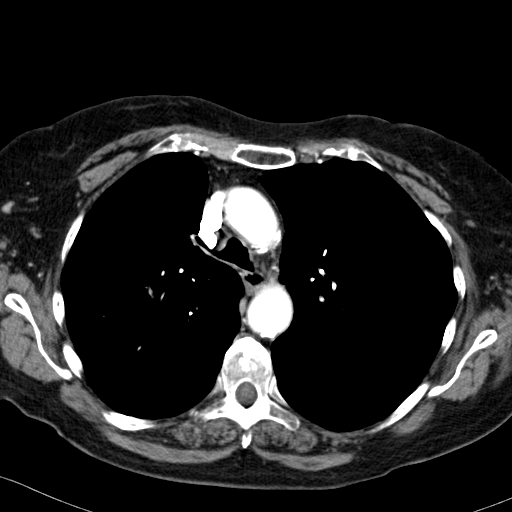
[im 43/63  lung]
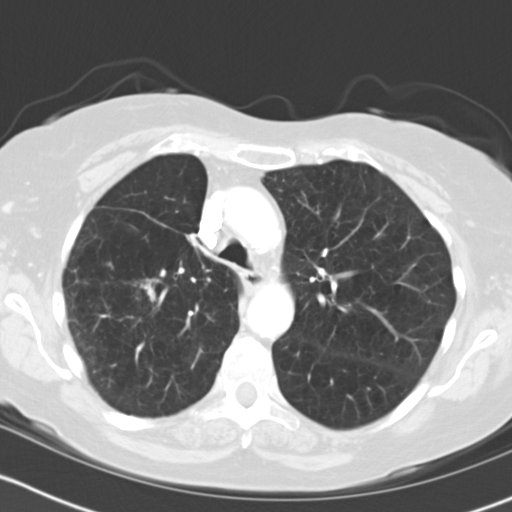
[im 48/63  lung]
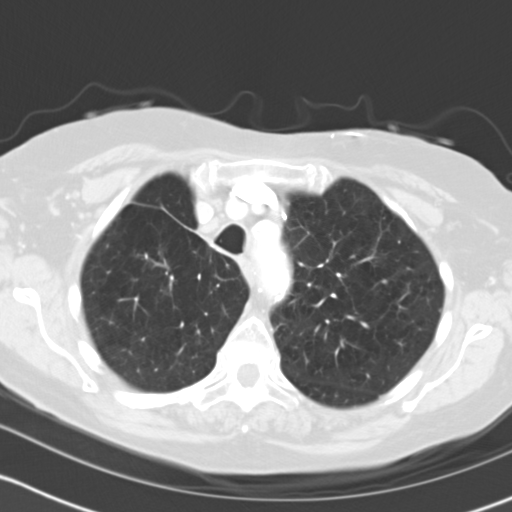
[im 53/63  lung]
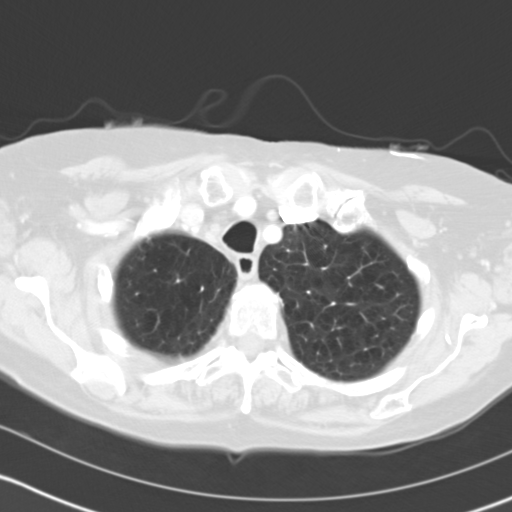
[im 58/63  lung]
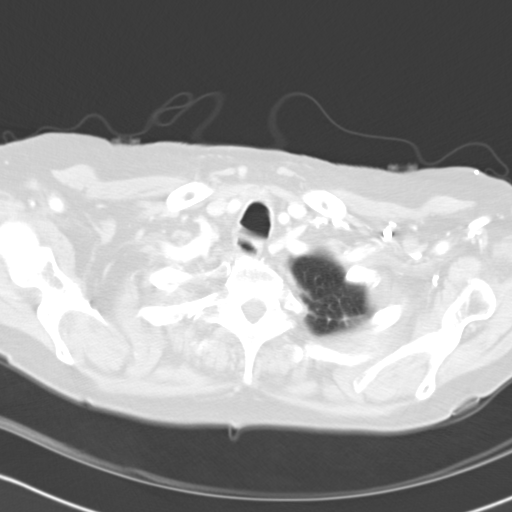

[Series 602: cor · coronal · 0.61mm/px · 3 of 102 slices shown]
[im 21/102  lung]
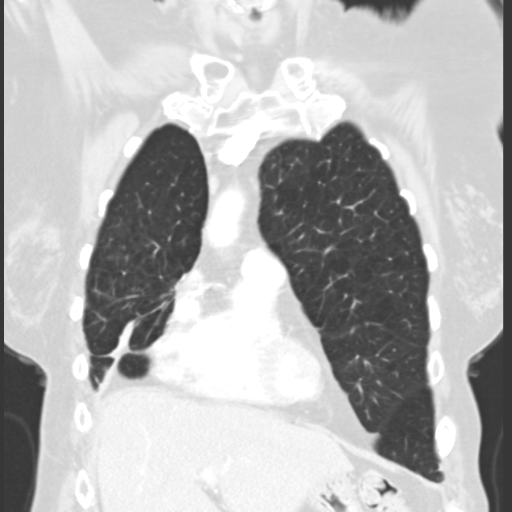
[im 41/102  lung]
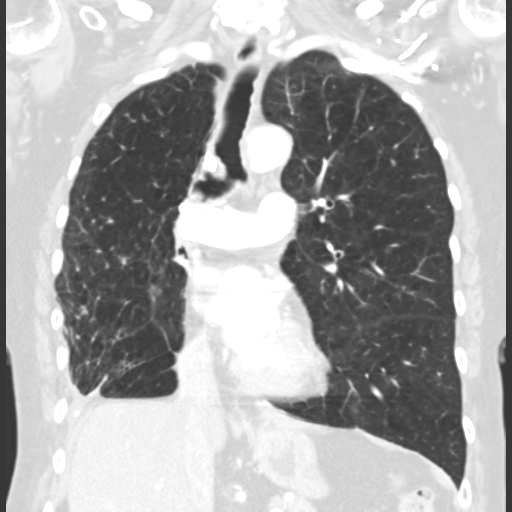
[im 61/102  lung]
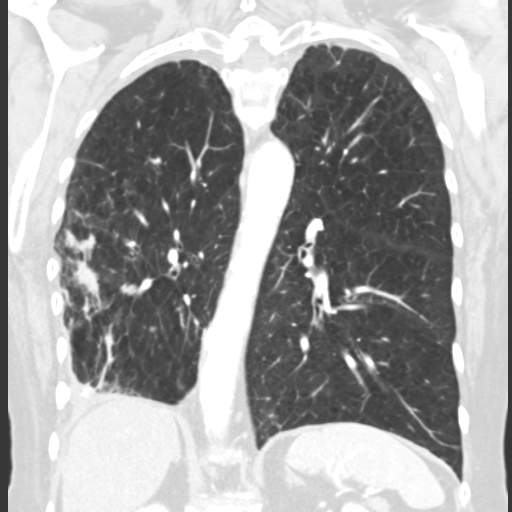

[15 of 36 positions shown; findings below may reference images not displayed]

FINDINGS: Mediastinum: Heart size is normal. There is no significant
pericardial fluid, thickening or pericardial calcification. There is
atherosclerosis of the thoracic aorta, the great vessels of the
mediastinum and the coronary arteries, including calcified
atherosclerotic plaque in the left anterior descending, left
circumflex and right coronary arteries. Prominent nodal tissue in
the infrahilar region on the right measuring 1.8 x 0.9 cm (image 32
of series 2) increased compared to the prior examination. No other
definite pathologically enlarged mediastinal or left hilar lymph
nodes are noted. Small hiatal hernia.

Lungs/Pleura: Status post right upper lobectomy. Mild diffuse
bronchial wall thickening with moderate centrilobular emphysema.
Compared to the prior examination there is extensive septal
thickening and patchy nodularity throughout the right lower lobe,
most suggestive of infection. The largest of these nodules include a
1.6 x 1.1 cm nodule (image 34 of series 3), and a 1.2 x 1.3 cm
nodule (image 32 of series 3). Pleural calcifications are again
noted in the lower right hemithorax, presumably related to prior
surgery. Partially calcified 12 x 7 mm nodule associated with the
minor fissure is very similar to the prior examination (allowing for
differences in slice selection), and favored to be benign. Chronic
pleural thickening in the base of the right hemithorax is unchanged.

Upper Abdomen: Sub cm low-attenuation lesion adjacent to this
falciform ligament in the liver is too small characterize, but
favored to represent a tiny cyst.

Musculoskeletal: There are no aggressive appearing lytic or blastic
lesions noted in the visualized portions of the skeleton.
IMPRESSION: 1. Extensive septal thickening and patchy nodularity throughout the
right lung, particularly in the right lower lobe, favored to be
related to an atypical infection. Prominent right infrahilar lymph
nodes are likely reactive in this setting. However, a repeat
contrast enhanced chest CT is recommended in 2-3 months after
appropriate trial of antimicrobial therapy to ensure resolution of
these findings.
2. Status post right upper lobectomy.
3. Mild diffuse bronchial wall thickening with moderate
centrilobular emphysema; imaging findings suggestive of underlying
COPD.
4. Atherosclerosis, including 3 vessel coronary artery disease.
Assessment for potential risk factor modification, dietary therapy
or pharmacologic therapy may be warranted, if clinically indicated.
5. Additional incidental findings, similar prior studies, as above.

## 2015-11-10 DIAGNOSIS — H524 Presbyopia: Secondary | ICD-10-CM | POA: Diagnosis not present

## 2015-11-11 ENCOUNTER — Other Ambulatory Visit: Payer: Self-pay | Admitting: Internal Medicine

## 2015-11-15 DIAGNOSIS — J449 Chronic obstructive pulmonary disease, unspecified: Secondary | ICD-10-CM | POA: Diagnosis not present

## 2015-11-15 DIAGNOSIS — J9611 Chronic respiratory failure with hypoxia: Secondary | ICD-10-CM | POA: Diagnosis not present

## 2015-11-23 ENCOUNTER — Other Ambulatory Visit: Payer: Self-pay | Admitting: Internal Medicine

## 2015-11-23 NOTE — Telephone Encounter (Signed)
RX faxed to [facility]. Only sent for #[id number], pt has not seen Dr Quay Burow yet.

## 2015-12-02 ENCOUNTER — Ambulatory Visit (INDEPENDENT_AMBULATORY_CARE_PROVIDER_SITE_OTHER)
Admission: RE | Admit: 2015-12-02 | Discharge: 2015-12-02 | Disposition: A | Discharge status: Home / Self Care | Payer: PPO | Source: Ambulatory Visit | Attending: Internal Medicine | Admitting: Internal Medicine

## 2015-12-02 ENCOUNTER — Other Ambulatory Visit (INDEPENDENT_AMBULATORY_CARE_PROVIDER_SITE_OTHER): Payer: PPO

## 2015-12-02 ENCOUNTER — Ambulatory Visit (INDEPENDENT_AMBULATORY_CARE_PROVIDER_SITE_OTHER): Payer: PPO | Admitting: Internal Medicine

## 2015-12-02 ENCOUNTER — Encounter: Payer: Self-pay | Admitting: Internal Medicine

## 2015-12-02 VITALS — BP 138/72 | HR 79 | Temp 98.3°F | Resp 18 | Wt 155.0 lb

## 2015-12-02 DIAGNOSIS — R300 Dysuria: Secondary | ICD-10-CM | POA: Diagnosis not present

## 2015-12-02 DIAGNOSIS — M25559 Pain in unspecified hip: Secondary | ICD-10-CM | POA: Diagnosis not present

## 2015-12-02 DIAGNOSIS — E785 Hyperlipidemia, unspecified: Secondary | ICD-10-CM | POA: Diagnosis not present

## 2015-12-02 DIAGNOSIS — E038 Other specified hypothyroidism: Secondary | ICD-10-CM

## 2015-12-02 DIAGNOSIS — Z780 Asymptomatic menopausal state: Secondary | ICD-10-CM | POA: Diagnosis not present

## 2015-12-02 DIAGNOSIS — Z0000 Encounter for general adult medical examination without abnormal findings: Secondary | ICD-10-CM | POA: Diagnosis not present

## 2015-12-02 DIAGNOSIS — M25551 Pain in right hip: Secondary | ICD-10-CM | POA: Diagnosis not present

## 2015-12-02 DIAGNOSIS — F419 Anxiety disorder, unspecified: Secondary | ICD-10-CM

## 2015-12-02 DIAGNOSIS — M25552 Pain in left hip: Secondary | ICD-10-CM | POA: Diagnosis not present

## 2015-12-02 LAB — CBC WITH DIFFERENTIAL/PLATELET
BASOS ABS: 0 10*3/uL (ref 0.0–0.1)
Basophils Relative: 0.3 % (ref 0.0–3.0)
EOS ABS: 0 10*3/uL (ref 0.0–0.7)
Eosinophils Relative: 0.5 % (ref 0.0–5.0)
HEMATOCRIT: 35.8 % — AB (ref 36.0–46.0)
Hemoglobin: 11.9 g/dL — ABNORMAL LOW (ref 12.0–15.0)
LYMPHS PCT: 27.8 % (ref 12.0–46.0)
Lymphs Abs: 2 10*3/uL (ref 0.7–4.0)
MCHC: 33.2 g/dL (ref 30.0–36.0)
MCV: 93.4 fl (ref 78.0–100.0)
MONO ABS: 0.8 10*3/uL (ref 0.1–1.0)
Monocytes Relative: 10.3 % (ref 3.0–12.0)
NEUTROS ABS: 4.5 10*3/uL (ref 1.4–7.7)
NEUTROS PCT: 61.1 % (ref 43.0–77.0)
PLATELETS: 275 10*3/uL (ref 150.0–400.0)
RBC: 3.83 Mil/uL — ABNORMAL LOW (ref 3.87–5.11)
RDW: 13.4 % (ref 11.5–15.5)
WBC: 7.3 10*3/uL (ref 4.0–10.5)

## 2015-12-02 LAB — COMPREHENSIVE METABOLIC PANEL
ALT: 14 U/L (ref 0–35)
AST: 24 U/L (ref 0–37)
Albumin: 3.9 g/dL (ref 3.5–5.2)
Alkaline Phosphatase: 94 U/L (ref 39–117)
BILIRUBIN TOTAL: 0.6 mg/dL (ref 0.2–1.2)
BUN: 6 mg/dL (ref 6–23)
CALCIUM: 9.6 mg/dL (ref 8.4–10.5)
CHLORIDE: 96 meq/L (ref 96–112)
CO2: 32 meq/L (ref 19–32)
CREATININE: 0.75 mg/dL (ref 0.40–1.20)
GFR: 78.9 mL/min (ref >60.00)
GLUCOSE: 105 mg/dL — AB (ref 70–99)
Potassium: 3.9 mEq/L (ref 3.5–5.1)
SODIUM: 134 meq/L — AB (ref 135–145)
Total Protein: 7.2 g/dL (ref 6.0–8.3)

## 2015-12-02 LAB — LIPID PANEL
Cholesterol: 178 mg/dL (ref 0–200)
HDL: 74 mg/dL (ref >39.00)
LDL Cholesterol: 85 mg/dL (ref 0–99)
NONHDL: 103.59
Total CHOL/HDL Ratio: 2
Triglycerides: 95 mg/dL (ref 0.0–149.0)
VLDL: 19 mg/dL (ref 0.0–40.0)

## 2015-12-02 LAB — TSH: TSH: 1.22 u[IU]/mL (ref 0.35–4.50)

## 2015-12-02 LAB — URINALYSIS, ROUTINE W REFLEX MICROSCOPIC
Bilirubin Urine: NEGATIVE
Hgb urine dipstick: NEGATIVE
Ketones, ur: NEGATIVE
Leukocytes, UA: NEGATIVE
NITRITE: NEGATIVE
PH: 7.5 (ref 5.0–8.0)
RBC / HPF: NONE SEEN (ref 0–?)
TOTAL PROTEIN, URINE-UPE24: NEGATIVE
URINE GLUCOSE: NEGATIVE
Urobilinogen, UA: 0.2 (ref 0.0–1.0)
WBC, UA: NONE SEEN (ref 0–?)

## 2015-12-02 NOTE — Progress Notes (Signed)
Pre visit review using our clinic review tool, if applicable. No additional management support is needed unless otherwise documented below in the visit note. 

## 2015-12-02 NOTE — Assessment & Plan Note (Signed)
She does experience some dysuria and urgency. She feels this is likely related to her bladder prolapse Check urinalysis, urine culture

## 2015-12-02 NOTE — Assessment & Plan Note (Addendum)
Takes xanax nightly Has tried several other medications and they were not effective Continue xanax

## 2015-12-02 NOTE — Patient Instructions (Addendum)
Test(s) ordered today. Your results will be released to [place] (or called to you) after review, usually within 72hours after test completion. If any changes need to be made, you will be notified at that same time.  All other Health Maintenance issues reviewed.   All recommended immunizations and age-appropriate screenings are up-to-date or discussed.  No immunizations administered today.   Medications reviewed and updated.  No changes recommended at this time.   Please followup in one year, sooner if needed   Health Maintenance, Female Adopting a healthy lifestyle and getting preventive care can go a long way to promote health and wellness. Talk with your health care provider about what schedule of regular examinations is right for you. This is a good chance for you to check in with your provider about disease prevention and staying healthy. In between checkups, there are plenty of things you can do on your own. Experts have done a lot of research about which lifestyle changes and preventive measures are most likely to keep you healthy. Ask your health care provider for more information. WEIGHT AND DIET  Eat a healthy diet  Be sure to include plenty of vegetables, fruits, low-fat dairy products, and lean protein.  Do not eat a lot of foods high in solid fats, added sugars, or salt.  Get regular exercise. This is one of the most important things you can do for your health.  Most adults should exercise for at least 150 minutes each week. The exercise should increase your heart rate and make you sweat (moderate-intensity exercise).  Most adults should also do strengthening exercises at least twice a week. This is in addition to the moderate-intensity exercise.  Maintain a healthy weight  Body mass index (BMI) is a measurement that can be used to identify possible weight problems. It estimates body fat based on height and weight. Your health care provider can help determine your BMI and  help you achieve or maintain a healthy weight.  For females 70 years of age and older:   A BMI below 18.5 is considered underweight.  A BMI of 18.5 to 24.9 is normal.  A BMI of 25 to 29.9 is considered overweight.  A BMI of 30 and above is considered obese.  Watch levels of cholesterol and blood lipids  You should start having your blood tested for lipids and cholesterol at 80 years of age, then have this test every 5 years.  You may need to have your cholesterol levels checked more often if:  Your lipid or cholesterol levels are high.  You are older than 80 years of age.  You are at high risk for heart disease.  CANCER SCREENING   Lung Cancer  Lung cancer screening is recommended for adults 70-80 years old who are at high risk for lung cancer because of a history of smoking.  A yearly low-dose CT scan of the lungs is recommended for people who:  Currently smoke.  Have quit within the past 15 years.  Have at least a 30-pack-year history of smoking. A pack year is smoking an average of one pack of cigarettes a day for 1 year.  Yearly screening should continue until it has been 15 years since you quit.  Yearly screening should stop if you develop a health problem that would prevent you from having lung cancer treatment.  Breast Cancer  Practice breast self-awareness. This means understanding how your breasts normally appear and feel.  It also means doing regular breast self-exams. Let your  health care provider know about any changes, no matter how small.  If you are in your 20s or 30s, you should have a clinical breast exam (CBE) by a health care provider every 1-3 years as part of a regular health exam.  If you are 104 or older, have a CBE every year. Also consider having a breast X-ray (mammogram) every year.  If you have a family history of breast cancer, talk to your health care provider about genetic screening.  If you are at high risk for breast cancer, talk  to your health care provider about having an MRI and a mammogram every year.  Breast cancer gene (BRCA) assessment is recommended for women who have family members with BRCA-related cancers. BRCA-related cancers include:  Breast.  Ovarian.  Tubal.  Peritoneal cancers.  Results of the assessment will determine the need for genetic counseling and BRCA1 and BRCA2 testing. Cervical Cancer Your health care provider may recommend that you be screened regularly for cancer of the pelvic organs (ovaries, uterus, and vagina). This screening involves a pelvic examination, including checking for microscopic changes to the surface of your cervix (Pap test). You may be encouraged to have this screening done every 3 years, beginning at age 42.  For women ages 97-65, health care providers may recommend pelvic exams and Pap testing every 3 years, or they may recommend the Pap and pelvic exam, combined with testing for human papilloma virus (HPV), every 5 years. Some types of HPV increase your risk of cervical cancer. Testing for HPV may also be done on women of any age with unclear Pap test results.  Other health care providers may not recommend any screening for nonpregnant women who are considered low risk for pelvic cancer and who do not have symptoms. Ask your health care provider if a screening pelvic exam is right for you.  If you have had past treatment for cervical cancer or a condition that could lead to cancer, you need Pap tests and screening for cancer for at least 20 years after your treatment. If Pap tests have been discontinued, your risk factors (such as having a new sexual partner) need to be reassessed to determine if screening should resume. Some women have medical problems that increase the chance of getting cervical cancer. In these cases, your health care provider may recommend more frequent screening and Pap tests. Colorectal Cancer  This type of cancer can be detected and often  prevented.  Routine colorectal cancer screening usually begins at 80 years of age and continues through 80 years of age.  Your health care provider may recommend screening at an earlier age if you have risk factors for colon cancer.  Your health care provider may also recommend using home test kits to check for hidden blood in the stool.  A small camera at the end of a tube can be used to examine your colon directly (sigmoidoscopy or colonoscopy). This is done to check for the earliest forms of colorectal cancer.  Routine screening usually begins at age 51.  Direct examination of the colon should be repeated every 5-10 years through 80 years of age. However, you may need to be screened more often if early forms of precancerous polyps or small growths are found. Skin Cancer  Check your skin from head to toe regularly.  Tell your health care provider about any new moles or changes in moles, especially if there is a change in a mole's shape or color.  Also tell your health  care provider if you have a mole that is larger than the size of a pencil eraser.  Always use sunscreen. Apply sunscreen liberally and repeatedly throughout the day.  Protect yourself by wearing long sleeves, pants, a wide-brimmed hat, and sunglasses whenever you are outside. HEART DISEASE, DIABETES, AND HIGH BLOOD PRESSURE   High blood pressure causes heart disease and increases the risk of stroke. High blood pressure is more likely to develop in:  People who have blood pressure in the high end of the normal range (130-139/85-89 mm Hg).  People who are overweight or obese.  People who are African American.  If you are 109-50 years of age, have your blood pressure checked every 3-5 years. If you are 22 years of age or older, have your blood pressure checked every year. You should have your blood pressure measured twice--once when you are at a hospital or clinic, and once when you are not at a hospital or clinic.  Record the average of the two measurements. To check your blood pressure when you are not at a hospital or clinic, you can use:  An automated blood pressure machine at a pharmacy.  A home blood pressure monitor.  If you are between 75 years and 79 years old, ask your health care provider if you should take aspirin to prevent strokes.  Have regular diabetes screenings. This involves taking a blood sample to check your fasting blood sugar level.  If you are at a normal weight and have a low risk for diabetes, have this test once every three years after 80 years of age.  If you are overweight and have a high risk for diabetes, consider being tested at a younger age or more often. PREVENTING INFECTION  Hepatitis B  If you have a higher risk for hepatitis B, you should be screened for this virus. You are considered at high risk for hepatitis B if:  You were born in a country where hepatitis B is common. Ask your health care provider which countries are considered high risk.  Your parents were born in a high-risk country, and you have not been immunized against hepatitis B (hepatitis B vaccine).  You have HIV or AIDS.  You use needles to inject street drugs.  You live with someone who has hepatitis B.  You have had sex with someone who has hepatitis B.  You get hemodialysis treatment.  You take certain medicines for conditions, including cancer, organ transplantation, and autoimmune conditions. Hepatitis C  Blood testing is recommended for:  Everyone born from [birth year] through 1963.  Anyone with known risk factors for hepatitis C. Sexually transmitted infections (STIs)  You should be screened for sexually transmitted infections (STIs) including gonorrhea and chlamydia if:  You are sexually active and are younger than 80 years of age.  You are older than 80 years of age and your health care provider tells you that you are at risk for this type of infection.  Your sexual activity  has changed since you were last screened and you are at an increased risk for chlamydia or gonorrhea. Ask your health care provider if you are at risk.  If you do not have HIV, but are at risk, it may be recommended that you take a prescription medicine daily to prevent HIV infection. This is called pre-exposure prophylaxis (PrEP). You are considered at risk if:  You are sexually active and do not regularly use condoms or know the HIV status of your partner(s).  You take  drugs by injection.  You are sexually active with a partner who has HIV. Talk with your health care provider about whether you are at high risk of being infected with HIV. If you choose to begin PrEP, you should first be tested for HIV. You should then be tested every 3 months for as long as you are taking PrEP.  PREGNANCY   If you are premenopausal and you may become pregnant, ask your health care provider about preconception counseling.  If you may become pregnant, take 400 to 800 micrograms (mcg) of folic acid every day.  If you want to prevent pregnancy, talk to your health care provider about birth control (contraception). OSTEOPOROSIS AND MENOPAUSE   Osteoporosis is a disease in which the bones lose minerals and strength with aging. This can result in serious bone fractures. Your risk for osteoporosis can be identified using a bone density scan.  If you are 17 years of age or older, or if you are at risk for osteoporosis and fractures, ask your health care provider if you should be screened.  Ask your health care provider whether you should take a calcium or vitamin D supplement to lower your risk for osteoporosis.  Menopause may have certain physical symptoms and risks.  Hormone replacement therapy may reduce some of these symptoms and risks. Talk to your health care provider about whether hormone replacement therapy is right for you.  HOME CARE INSTRUCTIONS   Schedule regular health, dental, and eye  exams.  Stay current with your immunizations.   Do not use any tobacco products including cigarettes, chewing tobacco, or electronic cigarettes.  If you are pregnant, do not drink alcohol.  If you are breastfeeding, limit how much and how often you drink alcohol.  Limit alcohol intake to no more than 1 drink per day for nonpregnant women. One drink equals 12 ounces of beer, 5 ounces of wine, or 1 ounces of hard liquor.  Do not use street drugs.  Do not share needles.  Ask your health care provider for help if you need support or information about quitting drugs.  Tell your health care provider if you often feel depressed.  Tell your health care provider if you have ever been abused or do not feel safe at home.   This information is not intended to replace advice given to you by your health care provider. Make sure you discuss any questions you have with your health care provider.   Document Released: 12/20/2010 Document Revised: 06/27/2014 Document Reviewed: 05/08/2013 Elsevier Interactive Patient Education [employer].

## 2015-12-02 NOTE — Assessment & Plan Note (Signed)
Increased bilateral pain and falling We'll check x-rays of bilateral hips to make sure there is no small fractures If pain continues will need to see orthopedics or sports medicine

## 2015-12-02 NOTE — Assessment & Plan Note (Signed)
Check tsh  Titrate med dose if needed  

## 2015-12-02 NOTE — Progress Notes (Signed)
Subjective:    Patient ID: [name], female    DOB: October 09, 1932, 82 y.o.   MRN: [id number]  HPI She is here to establish with a new pcp.   She is here for a physical exam.   Recent falls:  She fell when she stepped off a curb.  She fell on her left side and hit her hip.  She had another fall into the bathtub when she was cleaning it.  She fell into the tub another time when her legs got twisted up.  For the past few weeks her right leg feels sore and feels like it it going to give out on her.  She can not lay on her left hip because it is sore.  She does have arthritis.  She wonders about a fracture or something else causing the pain.  She feels her balance is pretty good overall.    She has some soft, mobile lumps on her arms and wonders what they are.    She does a little exercise.  She wears oxygen most of the time.  She can only short period of time without it and then will become symptomatic.  Medications and allergies reviewed with patient and updated if appropriate.  Patient Active Problem List   Diagnosis Date Noted  . Chest pain 06/05/2015  . Chronic respiratory failure with hypoxia ([place]) 03/07/2015  . CAP (community acquired pneumonia) 12/22/2014  . Hypotension 12/22/2014  . Oral ulcer 11/24/2014  . Diverticulitis 05/24/2014  . Anxiety state, unspecified   . Bladder prolapse, female, acquired   . Arthritis   . Non-small cell carcinoma of lung, stage 1 ([place]) 01/10/2012  . Neuropathy, peripheral ([place]) 01/28/2011  . COPD mixed type ([place]) 09/08/2010  . Dyslipidemia   . ATONY OF BLADDER 08/10/2009  . Hypothyroidism 03/12/2008  . Seasonal allergic rhinitis 10/10/2007    Current Outpatient Prescriptions on File Prior to Visit  Medication Sig Dispense Refill  . acetaminophen (TYLENOL ARTHRITIS PAIN) 650 MG CR tablet Take 650 mg by mouth as needed for pain.    [name] albuterol (PROVENTIL HFA;VENTOLIN HFA) 108 (90 Base) MCG/ACT inhaler Inhale 2 puffs into the lungs every  6 (six) hours as needed for wheezing or shortness of breath. 1 Inhaler 12  . ALPRAZolam (XANAX) 1 MG tablet TAKE 1 TABLET BY MOUTH AT BEDTIME 30 tablet 0  . aspirin EC 81 MG tablet Take 81 mg by mouth 2 (two) times a week.     [name] atorvastatin (LIPITOR) 40 MG tablet Take 1 tablet (40 mg total) by mouth daily. 90 tablet 3  . beclomethasone (QVAR) 80 MCG/ACT inhaler Inhale 2 puffs into the lungs 2 (two) times daily. 8.7 g 11  . Cholecalciferol (VITAMIN D) 2000 UNITS tablet Take 1 tablet (2,000 Units total) by mouth daily. 30 tablet 11  . levalbuterol (XOPENEX) 1.25 MG/3ML nebulizer solution USE 1 VIAL IN NEBULIZER EVERY 8 HOURS AS NEEDED 72 mL 3  . levothyroxine (SYNTHROID, LEVOTHROID) 100 MCG tablet Take 1 tablet (100 mcg total) by mouth daily before breakfast. Keep August appt for future refills 90 tablet 0  . Multiple Vitamins-Minerals (CENTRUM SILVER PO) Take 1 tablet by mouth daily.     . promethazine (PHENERGAN) 25 MG tablet Take 1 tablet (25 mg total) by mouth every 6 (six) hours as needed for nausea or vomiting. 30 tablet 0  . Respiratory Therapy Supplies (FLUTTER) DEVI Blow through 4 times per cycle and repeat 3 cycles per day 1 each 0  No current facility-administered medications on file prior to visit.    Past Medical History  Diagnosis Date  . Allergic rhinitis   . COPD (chronic obstructive pulmonary disease) ([place])   . Pelvic cyst   . Bladder atony   . Unspecified hypothyroidism   . Hyperlipidemia   . Non-small cell carcinoma of lung, stage 1 ([place]) 2005    1.4 cm Poorly differentiated Squamous cell RUL  T1N0 resected 09/22/2003  . Asthma   . Emphysema of lung ([place])   . Diarrhea   . On home oxygen therapy     "3L q night and prn during the day" (12/22/2014)  . Pneumonia     "this is the 3rd time that I can remember" (12/22/2014)  . Chronic bronchitis ([place])   . GERD (gastroesophageal reflux disease)     "w/spicey foods" (12/22/2014)  . Arthritis     "back, hands; hips" (12/22/2014)    . Anxiety     Past Surgical History  Procedure Laterality Date  . Video assisted thoracoscopy (vats)/thorocotomy Right 09/2003    thoracotomy, right upper lobectomy with node dissection; Dr Demetrios Loll 11/02/2010  . Needle aspiration pelvic cyst  2011    Dr Diona Fanti  . Cataract extraction w/ intraocular lens  implant, bilateral Bilateral ~ 2012  . Back surgery    . Lumbar disc surgery  1970's  . Abdominal hysterectomy  ~ 1976  . Dilation and curettage of uterus    . Lymph node dissection  2005    "all my lymph nodes under breasts, went thru my back; Dr. Arlyce Dice did it"  . Video bronchoscopy  03/2004    /notes 11/02/2010    Social History   Social History  . Marital Status: Married    Spouse Name: N/A  . Number of Children: N/A  . Years of Education: N/A   Occupational History  . Retired Animator, [employer] [place] History Main Topics  . Smoking status: Former Smoker -- 1.00 packs/day for 42 years    Types: Cigarettes    Quit date: 09/22/2003  . Smokeless tobacco: Never Used  . Alcohol Use: 8.4 oz/week    14 Glasses of wine per week     Comment: 12/22/2014 "I have a large glass of wine qd"  . Drug Use: No  . Sexual Activity: No   Other Topics Concern  . Not on file   Social History Narrative    Family History  Problem Relation Age of Onset  . Heart disease Father   . Rheumatologic disease Sister   . Rheumatologic disease Brother   . Cancer Brother     Leukemia    Review of Systems  Constitutional: Positive for fatigue. Negative for fever, chills and appetite change.  HENT: Negative for hearing loss.   Eyes: Negative for visual disturbance.  Respiratory: Positive for cough, shortness of breath and wheezing.   Cardiovascular: Positive for palpitations (with hypoxia only) and leg swelling (feet - mild). Negative for chest pain.  Gastrointestinal: Negative for nausea, abdominal pain, diarrhea, constipation and blood in stool.       Occ gerd   Genitourinary: Positive for dysuria and frequency. Negative for hematuria.  Neurological: Positive for dizziness (with hypoxia) and light-headedness (with hypoxia). Negative for headaches.  Psychiatric/Behavioral: Negative for dysphoric mood. The patient is nervous/anxious.        Objective:   Filed Vitals:   12/02/15 1325  BP: 138/72  Pulse: 79  Temp: 98.3 F (36.8 C)  Resp: 18   Filed Weights   12/02/15 1325  Weight: 155 lb (70.308 kg)   Body mass index is 25.03 kg/(m^2).   Physical Exam Constitutional: She appears well-developed and well-nourished. No distress.  HENT:  Head: Normocephalic and atraumatic.  Right Ear: External ear normal. Normal ear canal and TM Left Ear: External ear normal.  Normal ear canal and TM Mouth/Throat: Oropharynx is clear and moist.  Eyes: Conjunctivae and EOM are normal.  Neck: Neck supple. No tracheal deviation present. No thyromegaly present.  No carotid bruit  Cardiovascular: Normal rate, regular rhythm and normal heart sounds.   No murmur heard.  No edema. Pulmonary/Chest: Effort normal and breath sounds normal. No respiratory distress. Fine,diffuse expiratory wheezes. She has no rales.  Abdominal: Soft. She exhibits no distension. There is no tenderness.  Lymphadenopathy: She has no cervical adenopathy.  Skin: Skin is warm and dry. She is not diaphoretic. lumps on arms consistent with lipomas Psychiatric: She has a normal mood and affect. Her behavior is normal.       Assessment & Plan:   Physical exam: Screening blood work  ordered Immunizations - discussed shingles vaccine, other vaccines up to date Colonoscopy - not needed at this age 89 last done 2016, no longer needed at this age Dexa - discussed dexa scan - Ordered today Eye exams Up to date Exercise  -doing some exercise - limited by need for oxygen Weight BMI wnl Skin  - lipomas on arms, no other concerns Substance abuse - none Reason. She does have a history  of falls as well that was thought to be possibly related to peripheral neuropathy. Some of her more recent falls or mechanical as well She feels her balance is good overall. Will monitor. She does have decreased activity related to quinine for oxygen and decreased exercise stamina. Can consider balance exercises May need further evaluation of neuropathy  See Problem List for Assessment and Plan of chronic medical problems.   Follow-up annually, sooner if needed

## 2015-12-02 NOTE — Assessment & Plan Note (Addendum)
Check lipid panel  Taking lipitor every other day Eats a healthy diet

## 2015-12-03 LAB — URINE CULTURE

## 2015-12-11 ENCOUNTER — Other Ambulatory Visit: Payer: Self-pay

## 2015-12-11 DIAGNOSIS — Z1231 Encounter for screening mammogram for malignant neoplasm of breast: Secondary | ICD-10-CM

## 2015-12-16 ENCOUNTER — Telehealth: Payer: Self-pay | Admitting: Internal Medicine

## 2015-12-16 ENCOUNTER — Encounter (HOSPITAL_COMMUNITY): Payer: Self-pay | Admitting: Emergency Medicine

## 2015-12-16 ENCOUNTER — Emergency Department (HOSPITAL_COMMUNITY): Payer: PPO

## 2015-12-16 ENCOUNTER — Emergency Department (HOSPITAL_COMMUNITY)
Admission: EM | Admit: 2015-12-16 | Discharge: 2015-12-16 | Disposition: A | Discharge status: Home / Self Care | Payer: PPO | Attending: Emergency Medicine | Admitting: Emergency Medicine

## 2015-12-16 DIAGNOSIS — J449 Chronic obstructive pulmonary disease, unspecified: Secondary | ICD-10-CM | POA: Diagnosis not present

## 2015-12-16 DIAGNOSIS — Z7982 Long term (current) use of aspirin: Secondary | ICD-10-CM | POA: Insufficient documentation

## 2015-12-16 DIAGNOSIS — R0602 Shortness of breath: Secondary | ICD-10-CM | POA: Diagnosis not present

## 2015-12-16 DIAGNOSIS — R079 Chest pain, unspecified: Secondary | ICD-10-CM | POA: Diagnosis not present

## 2015-12-16 DIAGNOSIS — J441 Chronic obstructive pulmonary disease with (acute) exacerbation: Secondary | ICD-10-CM | POA: Diagnosis not present

## 2015-12-16 DIAGNOSIS — J45901 Unspecified asthma with (acute) exacerbation: Secondary | ICD-10-CM | POA: Insufficient documentation

## 2015-12-16 DIAGNOSIS — J9611 Chronic respiratory failure with hypoxia: Secondary | ICD-10-CM | POA: Diagnosis not present

## 2015-12-16 DIAGNOSIS — Z79899 Other long term (current) drug therapy: Secondary | ICD-10-CM | POA: Diagnosis not present

## 2015-12-16 DIAGNOSIS — Z87891 Personal history of nicotine dependence: Secondary | ICD-10-CM | POA: Diagnosis not present

## 2015-12-16 LAB — CBC
HEMATOCRIT: 37.8 % (ref 36.0–46.0)
HEMOGLOBIN: 12.1 g/dL (ref 12.0–15.0)
MCH: 30.7 pg (ref 26.0–34.0)
MCHC: 32 g/dL (ref 30.0–36.0)
MCV: 95.9 fL (ref 78.0–100.0)
Platelets: 256 10*3/uL (ref 150–400)
RBC: 3.94 MIL/uL (ref 3.87–5.11)
RDW: 12.9 % (ref 11.5–15.5)
WBC: 7.1 10*3/uL (ref 4.0–10.5)

## 2015-12-16 LAB — BASIC METABOLIC PANEL
ANION GAP: 9 (ref 5–15)
BUN: 5 mg/dL — ABNORMAL LOW (ref 6–20)
CALCIUM: 9.7 mg/dL (ref 8.9–10.3)
CHLORIDE: 95 mmol/L — AB (ref 101–111)
CO2: 29 mmol/L (ref 22–32)
Creatinine, Ser: 0.76 mg/dL (ref 0.44–1.00)
GFR calc Af Amer: >60 mL/min (ref >60)
GFR calc non Af Amer: >60 mL/min (ref >60)
GLUCOSE: 113 mg/dL — AB (ref 65–99)
POTASSIUM: 3.6 mmol/L (ref 3.5–5.1)
Sodium: 133 mmol/L — ABNORMAL LOW (ref 135–145)

## 2015-12-16 LAB — I-STAT TROPONIN, ED
TROPONIN I, POC: 0.01 ng/mL (ref 0.00–0.08)
Troponin i, poc: 0.03 ng/mL (ref 0.00–0.08)

## 2015-12-16 LAB — D-DIMER, QUANTITATIVE: D-Dimer, Quant: 0.35 ug/mL-FEU (ref 0.00–0.50)

## 2015-12-16 IMAGING — CT CT ABD-PELV W/ CM
2 of 5 series · 16 of 46 positions shown, 18 images · IV contrast (Omni 300)
Comparison: 12/30/2013

CLINICAL DATA: Left lower quadrant pain, vomiting, diarrhea

EXAM:
CT ABDOMEN AND PELVIS WITH CONTRAST
TECHNIQUE: Multidetector CT imaging of the abdomen and pelvis was performed
using the standard protocol following bolus administration of
intravenous contrast.
CONTRAST:  100mL OMNIPAQUE IOHEXOL 300 MG/ML  SOLN

[Series 2: abd/ pelvis 5.0 i30f 1 · axial · 0.69mm/px · z∈[-479,-134]mm · 13 of 79 slices shown, 15 images]
[im 5/79  soft-tissue]
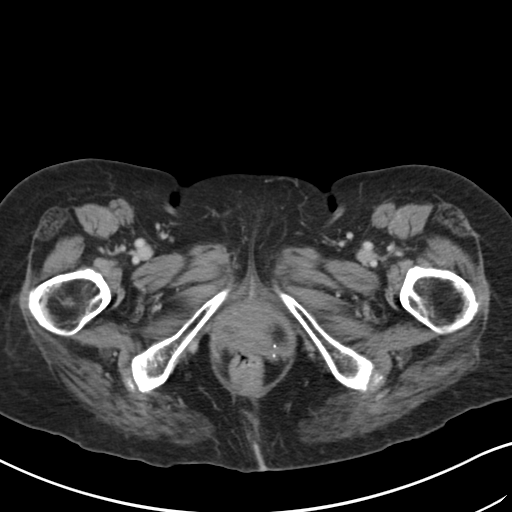
[im 5/79  bone]
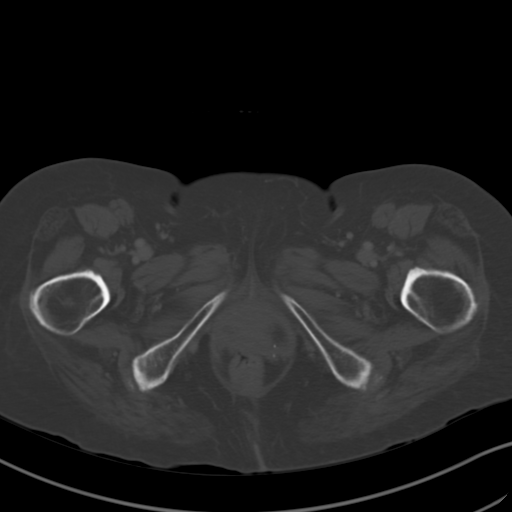
[im 13/79  soft-tissue]
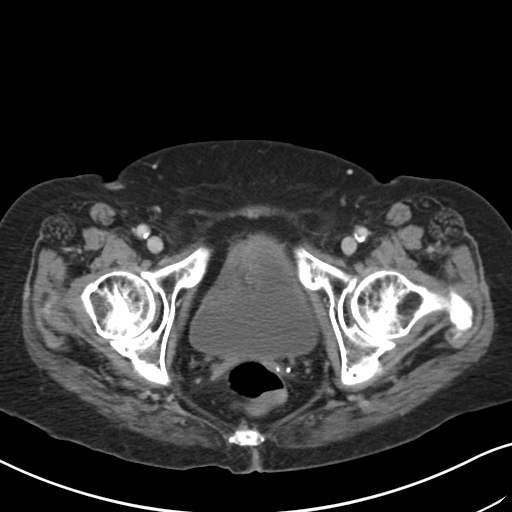
[im 17/79  soft-tissue]
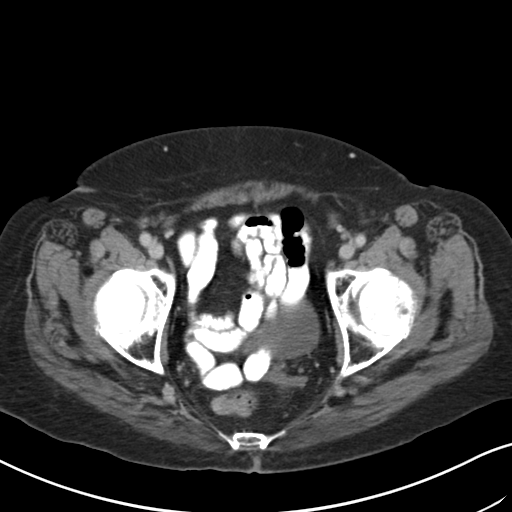
[im 21/79  soft-tissue]
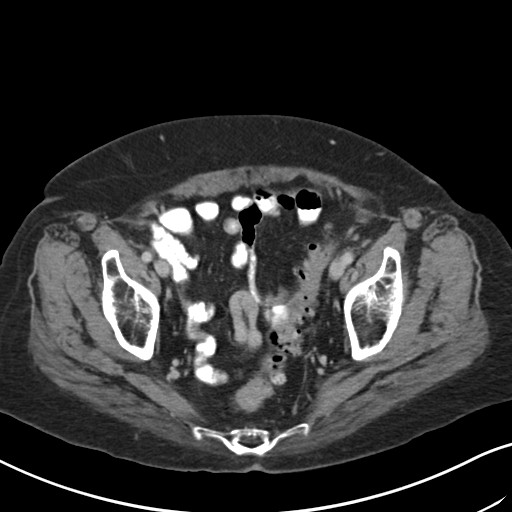
[im 29/79  soft-tissue]
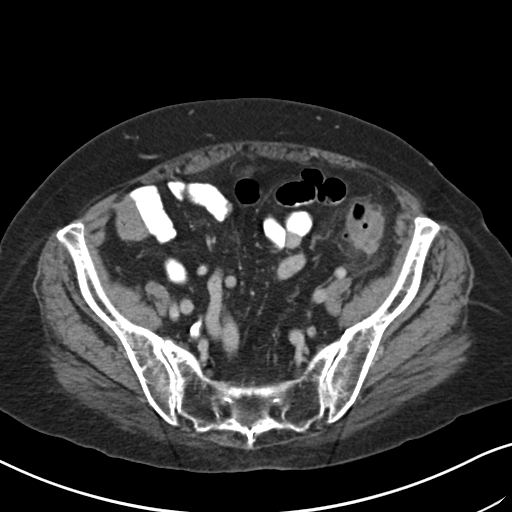
[im 33/79  soft-tissue]
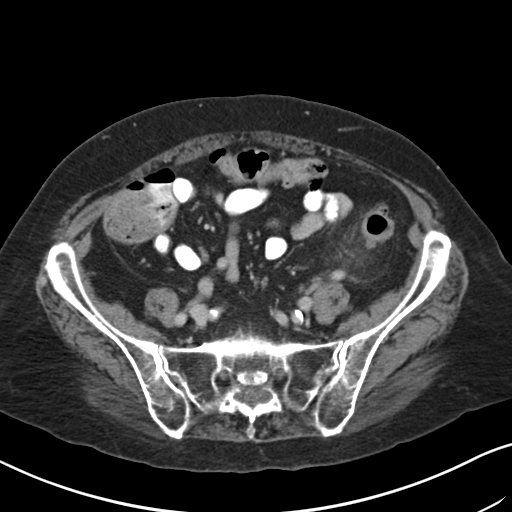
[im 42/79  soft-tissue]
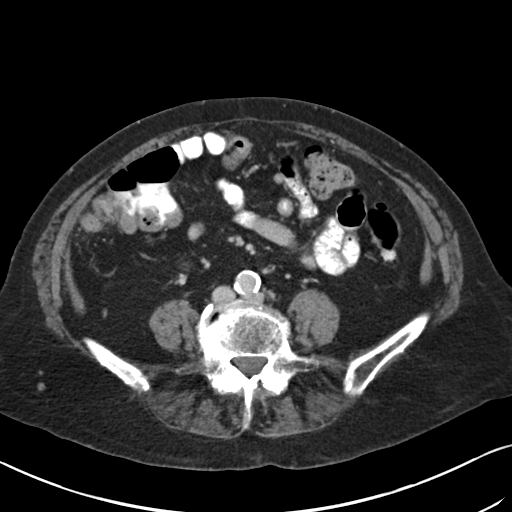
[im 46/79  soft-tissue]
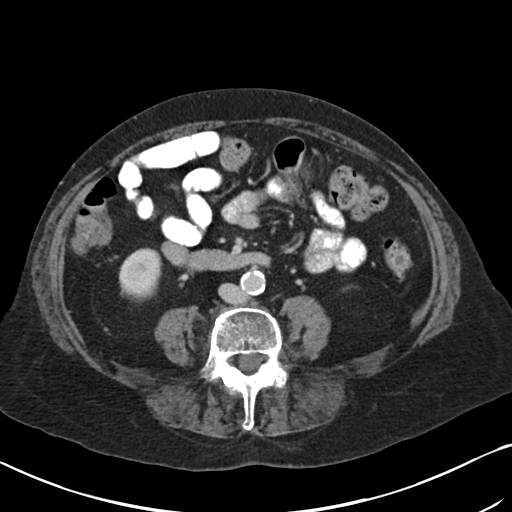
[im 50/79  soft-tissue]
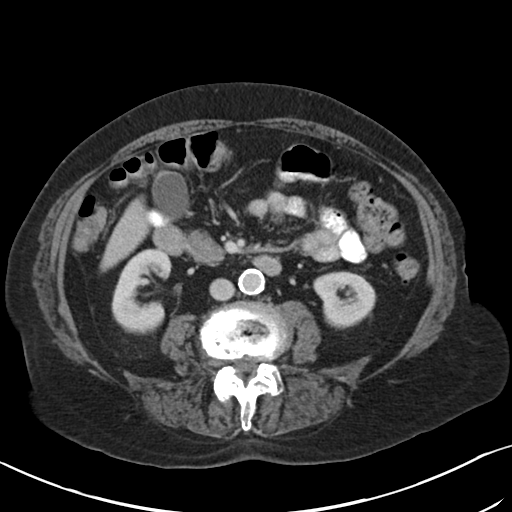
[im 50/79  bone]
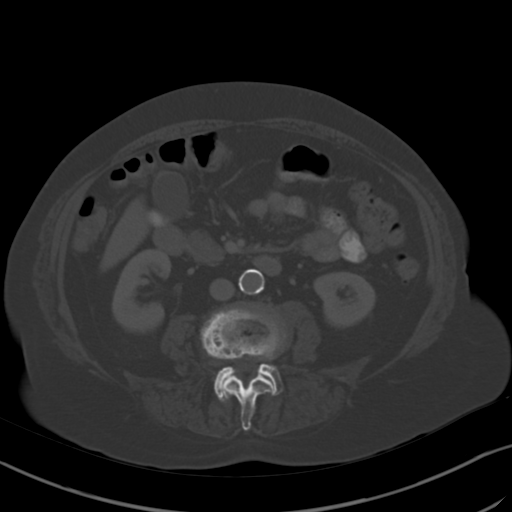
[im 58/79  soft-tissue]
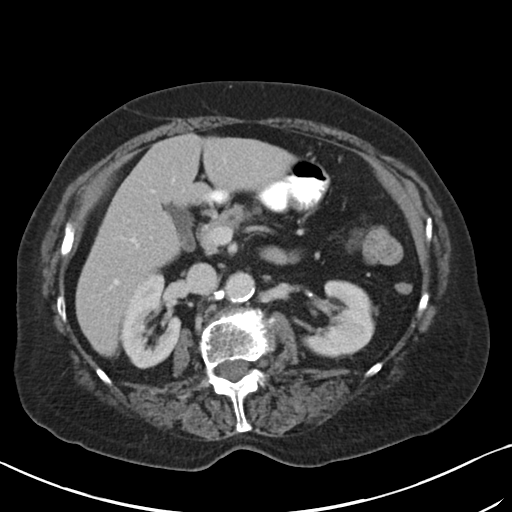
[im 62/79  soft-tissue]
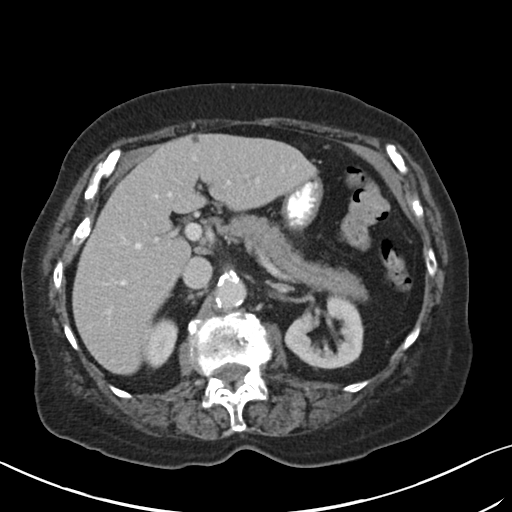
[im 66/79  soft-tissue]
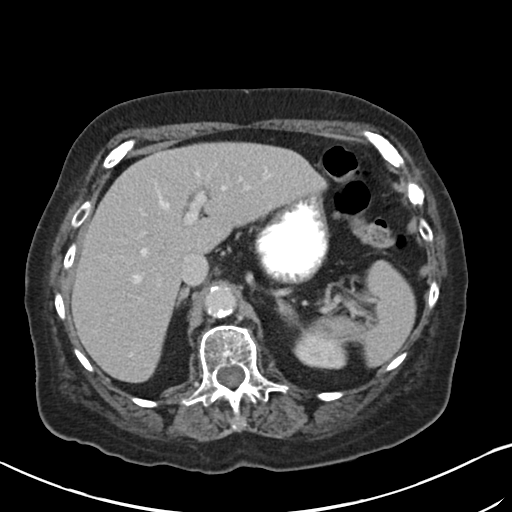
[im 74/79  soft-tissue]
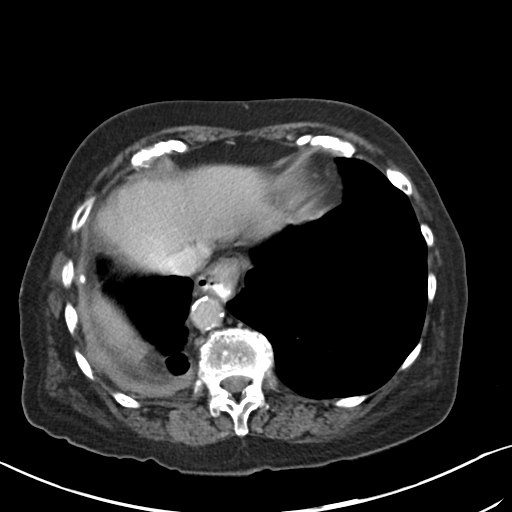

[Series 5: coronals · coronal · 0.70mm/px · 3 of 124 slices shown]
[im 42/124  soft-tissue]
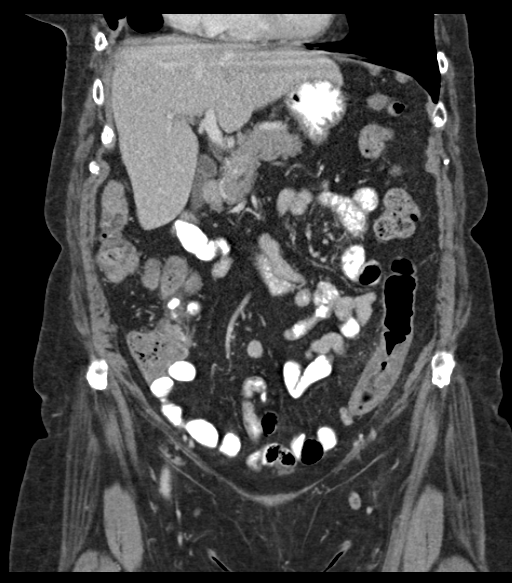
[im 55/124  soft-tissue]
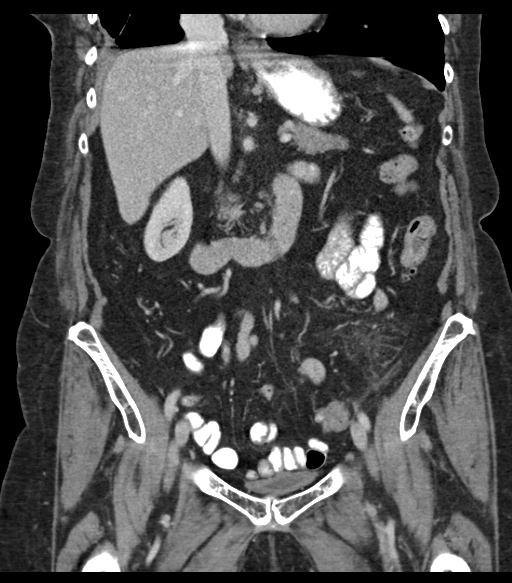
[im 69/124  soft-tissue]
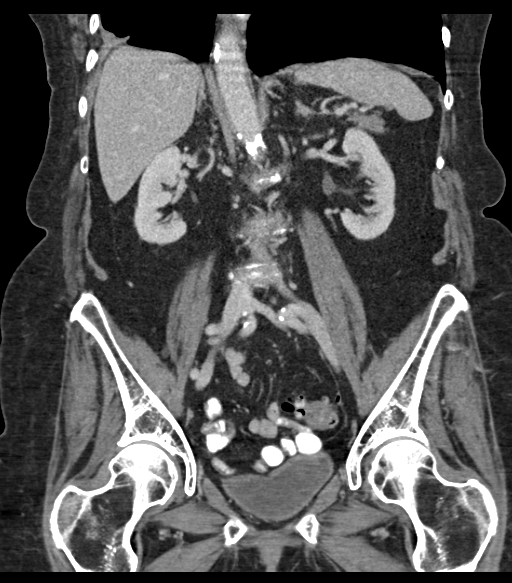

[16 of 46 positions shown; findings below may reference images not displayed]

FINDINGS: Lower chest: Trace right pleural effusion with pleural-based
calcifications, chronic. Emphysematous changes at the lung bases.

Hepatobiliary: Liver is notable for tiny probable cysts in the
inferior right hepatic lobe (series 2/images 26 and 28), although
technically indeterminate.

Gallbladder is unremarkable. No intrahepatic or extrahepatic ductal
dilatation.

Pancreas: Within normal limits.

Spleen: Within normal limits.

Adrenals/Urinary Tract: Adrenal glands are unremarkable.

Kidneys are within normal limits.

Bladder is unremarkable.

Stomach/Bowel: Stomach is notable for a small hiatal hernia.
Contrast in the distal esophagus, suggesting gastroesophageal reflux
or esophageal dysmotility.

No evidence of bowel obstruction.

Normal appendix.

Colonic diverticulosis, with wall thickening/inflammatory changes
involving the proximal sigmoid colon (series 2/ image 52),
compatible with sigmoid diverticulitis.

No drainable fluid collection/abscess.  No free air.

Vascular/Lymphatic: Atherosclerotic calcifications of the abdominal
aorta and branch vessels.

No suspicious abdominopelvic lymphadenopathy.

Reproductive: Status post hysterectomy.

No adnexal masses.

Other: No abdominopelvic ascites.

Musculoskeletal: Degenerative changes of the visualized
thoracolumbar spine.
IMPRESSION: Sigmoid diverticulitis.

No drainable fluid collection/abscess.  No free air.

## 2015-12-16 MED ORDER — PREDNISONE 20 MG PO TABS
40.0000 mg | ORAL_TABLET | Freq: Once | ORAL | Status: AC
  Administered 2015-12-16: 40 mg via ORAL
  Filled 2015-12-16: qty 2

## 2015-12-16 MED ORDER — PREDNISONE 10 MG PO TABS: 40.0000 mg | ORAL_TABLET | Freq: Every day | ORAL | Status: DC

## 2015-12-16 MED ORDER — ALBUTEROL SULFATE (2.5 MG/3ML) 0.083% IN NEBU
5.0000 mg | INHALATION_SOLUTION | Freq: Once | RESPIRATORY_TRACT | Status: AC
  Administered 2015-12-16: 5 mg via RESPIRATORY_TRACT
  Filled 2015-12-16: qty 6

## 2015-12-16 MED ORDER — SODIUM CHLORIDE 0.9 % IV BOLUS (SEPSIS)
250.0000 mL | Freq: Once | INTRAVENOUS | Status: AC
  Administered 2015-12-16: 250 mL via INTRAVENOUS

## 2015-12-16 MED ORDER — ACETAMINOPHEN 325 MG PO TABS
650.0000 mg | ORAL_TABLET | Freq: Once | ORAL | Status: AC
  Administered 2015-12-16: 650 mg via ORAL
  Filled 2015-12-16: qty 2

## 2015-12-16 NOTE — Discharge Instructions (Signed)

## 2015-12-16 NOTE — Telephone Encounter (Signed)
We are fully booked. She will need a CXR and assessment. I strongly recommend she go to ER for this problem.

## 2015-12-16 NOTE — Telephone Encounter (Signed)
Spoke with pt and she c/o trouble taking deep breath with chest/rib pain that radiates front to back and cough with white mucus since yesterday. Pt states that the pain is so bad she is unable to take a deep breath. Pt denies SOB/wheeze or f/n/v. Pt has taken albuterol HFA and Xopenex via neb with no symptom relief. Pt would like to be seen today if possible. Pt advised that if chest pain worsens, radiates down left arm or into jaw, develops headache, n/v or sudden SOB she needs to go to ED immediately.    CY Please advise. Thanks!  Allergies  Allergen Reactions  . Daliresp [Roflumilast] Nausea Only  . Tiotropium Bromide Monohydrate     Urinary retention  . Ampicillin     REACTION: GI upset  . Azithromycin     REACTION: diarrhea  . Codeine   . Fludrocortisone Acetate Itching  . Milk-Related Compounds   . Nitrofurantoin     REACTION: Upset GI  . Penicillins   . Sulfonamide Derivatives     Current Outpatient Prescriptions on File Prior to Visit  Medication Sig Dispense Refill  . acetaminophen (TYLENOL ARTHRITIS PAIN) 650 MG CR tablet Take 650 mg by mouth as needed for pain.    [name] albuterol (PROVENTIL HFA;VENTOLIN HFA) 108 (90 Base) MCG/ACT inhaler Inhale 2 puffs into the lungs every 6 (six) hours as needed for wheezing or shortness of breath. 1 Inhaler 12  . ALPRAZolam (XANAX) 1 MG tablet TAKE 1 TABLET BY MOUTH AT BEDTIME 30 tablet 0  . aspirin EC 81 MG tablet Take 81 mg by mouth 2 (two) times a week.     [name] atorvastatin (LIPITOR) 40 MG tablet Take 1 tablet (40 mg total) by mouth daily. 90 tablet 3  . beclomethasone (QVAR) 80 MCG/ACT inhaler Inhale 2 puffs into the lungs 2 (two) times daily. 8.7 g 11  . Cholecalciferol (VITAMIN D) 2000 UNITS tablet Take 1 tablet (2,000 Units total) by mouth daily. 30 tablet 11  . levalbuterol (XOPENEX) 1.25 MG/3ML nebulizer solution USE 1 VIAL IN NEBULIZER EVERY 8 HOURS AS NEEDED 72 mL 3  . levothyroxine (SYNTHROID, LEVOTHROID) 100 MCG tablet Take 1  tablet (100 mcg total) by mouth daily before breakfast. Keep August appt for future refills 90 tablet 0  . Multiple Vitamins-Minerals (CENTRUM SILVER PO) Take 1 tablet by mouth daily.     . promethazine (PHENERGAN) 25 MG tablet Take 1 tablet (25 mg total) by mouth every 6 (six) hours as needed for nausea or vomiting. 30 tablet 0  . Respiratory Therapy Supplies (FLUTTER) DEVI Blow through 4 times per cycle and repeat 3 cycles per day 1 each 0   No current facility-administered medications on file prior to visit.

## 2015-12-16 NOTE — Telephone Encounter (Signed)
Spoke with pt and gave CY's recommendation. She will go to [facility] ED. Nothing further needed.

## 2015-12-16 NOTE — ED Notes (Signed)
Pt states yesterday she started having a pain in the center of her chest that goes into her back. Pt states the pain feels achy and hurts to move and right side of ribs feels tender".

## 2015-12-16 NOTE — ED Provider Notes (Signed)
CSN: [id number]     Arrival date & time 12/16/15  1158 History   First MD Initiated Contact with Patient 12/16/15 1334     Chief Complaint  Patient presents with  . Chest Pain     Patient is a 82 y.o. female presenting with chest pain. The history is provided by the patient. No language interpreter was used.  Chest Pain  [name] is a 82 y.o. female who presents to the Emergency Department complaining of chest pain. She reports 1 day of chest pain, upper back pain or shortness of breath. Pain is worse with deep breaths. She has tried her home medications without any improvement in her symptoms. She denies any fevers. She has a chronic cough. For the last few days her medications are not allowing her cough to be productive. She denies any abdominal pain.  Her chronic lower extremities swelling is at its baseline. She has a history of COPD and is on 3 L of oxygen at home. She has a history of stage I lung cancer status post resection. Symptoms are moderate, constant, worsening.  Past Medical History  Diagnosis Date  . Allergic rhinitis   . COPD (chronic obstructive pulmonary disease) ([place])   . Pelvic cyst   . Bladder atony   . Unspecified hypothyroidism   . Hyperlipidemia   . Non-small cell carcinoma of lung, stage 1 ([place]) 2005    1.4 cm Poorly differentiated Squamous cell RUL  T1N0 resected 09/22/2003  . Asthma   . Emphysema of lung ([place])   . Diarrhea   . On home oxygen therapy     "3L q night and prn during the day" (12/22/2014)  . Pneumonia     "this is the 3rd time that I can remember" (12/22/2014)  . Chronic bronchitis ([place])   . GERD (gastroesophageal reflux disease)     "w/spicey foods" (12/22/2014)  . Arthritis     "back, hands; hips" (12/22/2014)  . Anxiety    Past Surgical History  Procedure Laterality Date  . Video assisted thoracoscopy (vats)/thorocotomy Right 09/2003    thoracotomy, right upper lobectomy with node dissection; Dr Demetrios Loll 11/02/2010  . Needle  aspiration pelvic cyst  2011    Dr Diona Fanti  . Cataract extraction w/ intraocular lens  implant, bilateral Bilateral ~ 2012  . Back surgery    . Lumbar disc surgery  1970's  . Abdominal hysterectomy  ~ 1976  . Dilation and curettage of uterus    . Lymph node dissection  2005    "all my lymph nodes under breasts, went thru my back; Dr. Arlyce Dice did it"  . Video bronchoscopy  03/2004    /notes 11/02/2010   Family History  Problem Relation Age of Onset  . Heart disease Father   . Rheumatologic disease Sister   . Rheumatologic disease Brother   . Cancer Brother     Leukemia   Social History  Substance Use Topics  . Smoking status: Former Smoker -- 1.00 packs/day for 42 years    Types: Cigarettes    Quit date: 09/22/2003  . Smokeless tobacco: Never Used  . Alcohol Use: 8.4 oz/week    14 Glasses of wine per week     Comment: 12/22/2014 "I have a large glass of wine qd"   OB History    No data available     Review of Systems  Cardiovascular: Positive for chest pain.  All other systems reviewed and are negative.     Allergies  Daliresp; Tiotropium bromide monohydrate; Ampicillin; Azithromycin; Codeine; Fludrocortisone acetate; Lactose intolerance (gi); Milk-related compounds; Nitrofurantoin; Other; Penicillins; Prednisone; and Sulfonamide derivatives  Home Medications   Prior to Admission medications   Medication Sig Start Date End Date Taking? Authorizing Provider  acetaminophen (TYLENOL ARTHRITIS PAIN) 650 MG CR tablet Take 650 mg by mouth daily as needed for pain.    Yes Historical Provider, MD  albuterol (PROVENTIL HFA;VENTOLIN HFA) 108 (90 Base) MCG/ACT inhaler Inhale 2 puffs into the lungs every 6 (six) hours as needed for wheezing or shortness of breath. 07/06/15  Yes Deneise Lever, MD  ALPRAZolam Duanne Moron) 1 MG tablet TAKE 1 TABLET BY MOUTH AT BEDTIME 11/23/15  Yes Binnie Rail, MD  aspirin EC 81 MG tablet Take 81 mg by mouth 3 (three) times a week.    Yes Historical  Provider, MD  atorvastatin (LIPITOR) 40 MG tablet Take 1 tablet (40 mg total) by mouth daily. Patient taking differently: Take 40 mg by mouth every other day. At [facility] 11/04/14  Yes Rowe Clack, MD  beclomethasone (QVAR) 80 MCG/ACT inhaler Inhale 2 puffs into the lungs 2 (two) times daily. 03/05/15  Yes Deneise Lever, MD  Cholecalciferol (VITAMIN D) 2000 UNITS tablet Take 1 tablet (2,000 Units total) by mouth daily. 04/29/15  Yes Rowe Clack, MD  levalbuterol (XOPENEX) 1.25 MG/3ML nebulizer solution USE 1 VIAL IN NEBULIZER EVERY 8 HOURS AS NEEDED Patient taking differently: Take 1.25 mg by nebulization every 8 (eight) hours as needed for wheezing or shortness of breath.  08/28/15  Yes Deneise Lever, MD  levothyroxine (SYNTHROID, LEVOTHROID) 100 MCG tablet Take 1 tablet (100 mcg total) by mouth daily before breakfast. Keep August appt for future refills 11/11/15  Yes Binnie Rail, MD  Multiple Vitamins-Minerals (CENTRUM SILVER PO) Take 1 tablet by mouth daily.    Yes Historical Provider, MD  promethazine (PHENERGAN) 25 MG tablet Take 1 tablet (25 mg total) by mouth every 6 (six) hours as needed for nausea or vomiting. 11/04/14  Yes Rowe Clack, MD  Respiratory Therapy Supplies (FLUTTER) DEVI Blow through 4 times per cycle and repeat 3 cycles per day 10/15/13  Yes Deneise Lever, MD  predniSONE (DELTASONE) 10 MG tablet Take 4 tablets (40 mg total) by mouth daily. 12/16/15   Quintella Reichert, MD   BP 138/74 mmHg  Pulse 89  Temp(Src) 98.1 F (36.7 C) (Oral)  Resp 23  SpO2 100% Physical Exam  Constitutional: She is oriented to person, place, and time. She appears well-developed and well-nourished.  HENT:  Head: Normocephalic and atraumatic.  Cardiovascular: Normal rate and regular rhythm.   No murmur heard. Pulmonary/Chest: Effort normal. No respiratory distress.  End expiratory wheezes bilaterally  Abdominal: Soft. There is no tenderness. There is no rebound and no guarding.   Musculoskeletal: She exhibits no edema or tenderness.  Neurological: She is alert and oriented to person, place, and time.  Skin: Skin is warm and dry.  Psychiatric: She has a normal mood and affect. Her behavior is normal.  Nursing note and vitals reviewed.   ED Course  Procedures (including critical care time) Labs Review Labs Reviewed  BASIC METABOLIC PANEL - Abnormal; Notable for the following:    Sodium 133 (*)    Chloride 95 (*)    Glucose, Bld 113 (*)    BUN 5 (*)    All other components within normal limits  CBC  D-DIMER, QUANTITATIVE (NOT AT [facility])  Randolm Idol, ED  I-STAT  TROPOININ, ED    Imaging Review Dg Chest 2 View  12/16/2015  CLINICAL DATA:  Shortness of breath, wheezing, RIGHT chest wall pain for 3 days, COPD, history stage I non-small cell lung cancer EXAM: CHEST  2 VIEW COMPARISON:  05/25/2015 FINDINGS: Normal heart size, mediastinal contours, and pulmonary vascularity. Atherosclerotic calcification aorta. Emphysematous changes compatible with COPD. Postsurgical changes of the inferior RIGHT lung with scarring and chronic pleural thickening versus pleural effusion at costophrenic angle. Volume loss RIGHT hemithorax with slight mediastinal shift to the RIGHT. No acute infiltrate, pleural effusion or pneumothorax. Question calcified granuloma RIGHT mid lung. Bones demineralized. IMPRESSION: Postsurgical changes RIGHT hemithorax. Underlying COPD changes without evidence of acute infiltrate. Aortic atherosclerosis. Electronically Signed   By: Lavonia Dana M.D.   On: 12/16/2015 12:59   I have personally reviewed and evaluated these images and lab results as part of my medical decision-making.   EKG Interpretation   Date/Time:  Wednesday December 16 2015 12:03:42 EDT Ventricular Rate:  92 PR Interval:  126 QRS Duration: 70 QT Interval:  324 QTC Calculation: 400 R Axis:   63 Text Interpretation:  Normal sinus rhythm Nonspecific ST abnormality  Abnormal ECG  Confirmed by Hazle Coca [phone]) on 12/16/2015 1:22:24 PM      MDM   Final diagnoses:  COPD exacerbation ([place])    Patient with history of COPD here for increased shortness of breath and chest tightness for the last day. She does feel improved in the emergency department following medications. Presentation is consistent with COPD exacerbation. Presentation is not consistent with ACS, PE, dissection. Plan to DC home with prednisone, continued albuterol, outpatient follow-up, return precautions.    Quintella Reichert, MD 12/17/15 1452

## 2015-12-17 ENCOUNTER — Telehealth: Payer: Self-pay | Admitting: Internal Medicine

## 2015-12-17 NOTE — Telephone Encounter (Signed)
Called spoke with pt. She went to the ED yesterday and was diagnosed with COPD exacerbation. She reports she was told to follow up with Dr. Annamaria Boots within the next 2 weeks. No available openings. Please advise Dr. Annamaria Boots thanks

## 2015-12-18 ENCOUNTER — Other Ambulatory Visit: Payer: Self-pay | Admitting: Internal Medicine

## 2015-12-18 DIAGNOSIS — Z1231 Encounter for screening mammogram for malignant neoplasm of breast: Secondary | ICD-10-CM

## 2015-12-18 NOTE — Telephone Encounter (Signed)
Called spoke with pt. Informed her of CY recs. I scheduled her for an ov with Brandi on 01/04/16, She voiced understanding and had no further questions.

## 2015-12-18 NOTE — Telephone Encounter (Signed)
Please schedule with NP

## 2015-12-23 ENCOUNTER — Other Ambulatory Visit: Payer: Self-pay | Admitting: Internal Medicine

## 2015-12-31 ENCOUNTER — Ambulatory Visit: Payer: PPO

## 2016-01-04 ENCOUNTER — Ambulatory Visit: Payer: PPO | Admitting: Pulmonary Disease

## 2016-01-07 ENCOUNTER — Ambulatory Visit
Admission: RE | Admit: 2016-01-07 | Discharge: 2016-01-07 | Disposition: A | Discharge status: Home / Self Care | Payer: PPO | Source: Ambulatory Visit | Attending: Internal Medicine | Admitting: Internal Medicine

## 2016-01-07 ENCOUNTER — Ambulatory Visit: Payer: PPO

## 2016-01-07 DIAGNOSIS — Z1231 Encounter for screening mammogram for malignant neoplasm of breast: Secondary | ICD-10-CM | POA: Diagnosis not present

## 2016-01-21 ENCOUNTER — Other Ambulatory Visit: Payer: Self-pay | Admitting: Internal Medicine

## 2016-01-22 NOTE — Telephone Encounter (Signed)
Patient is currently out of medication.  Can this get sent today?

## 2016-01-22 NOTE — Telephone Encounter (Signed)
Please advise in dr burns absence, thanks

## 2016-01-22 NOTE — Telephone Encounter (Signed)
Done hardcopy to Corinne  

## 2016-01-22 NOTE — Telephone Encounter (Signed)
Rx faxed to pharmacy  

## 2016-01-23 IMAGING — CR DG CHEST 2V
2 series · 2 of 2 positions shown · non-contrast
Comparison: CT 04/08/2014 PA chest x-ray 04/06/2014.

CLINICAL DATA: Chronic cough.

EXAM:
CHEST  2 VIEW

[view not recorded (1 of 2)]
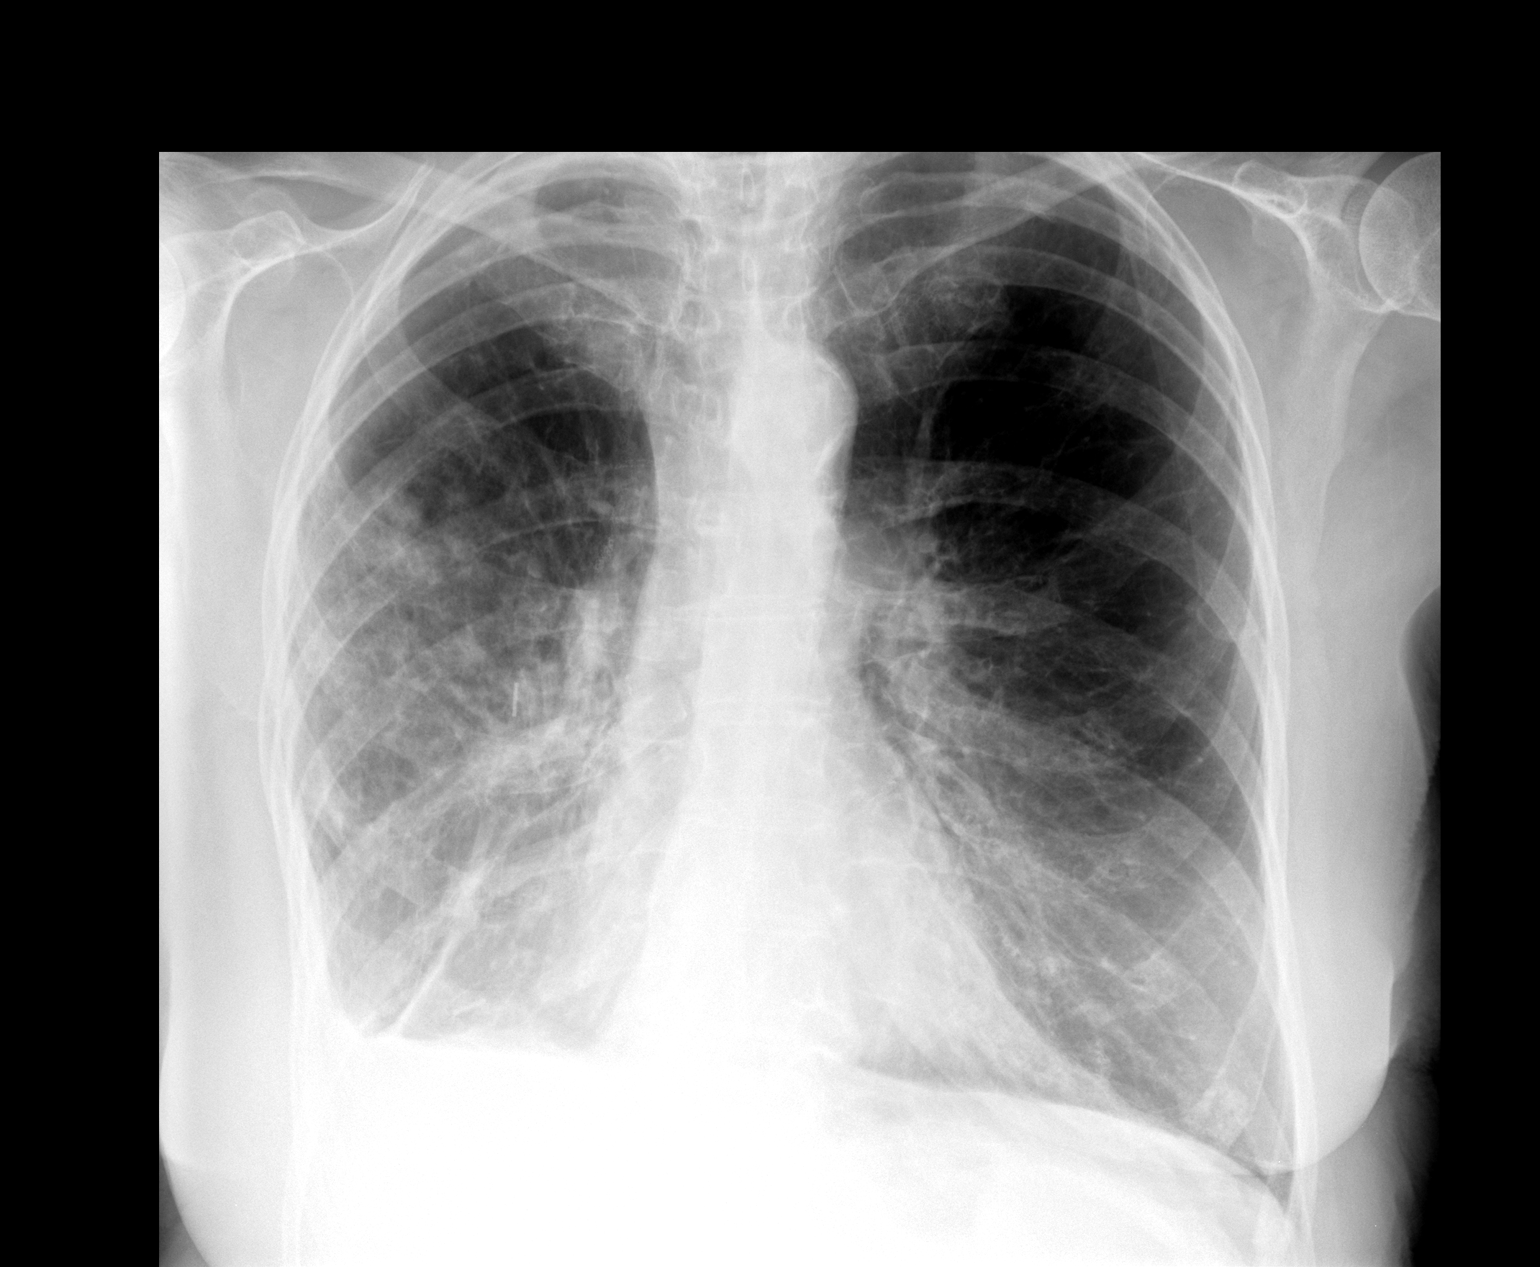

[view not recorded (2 of 2)]
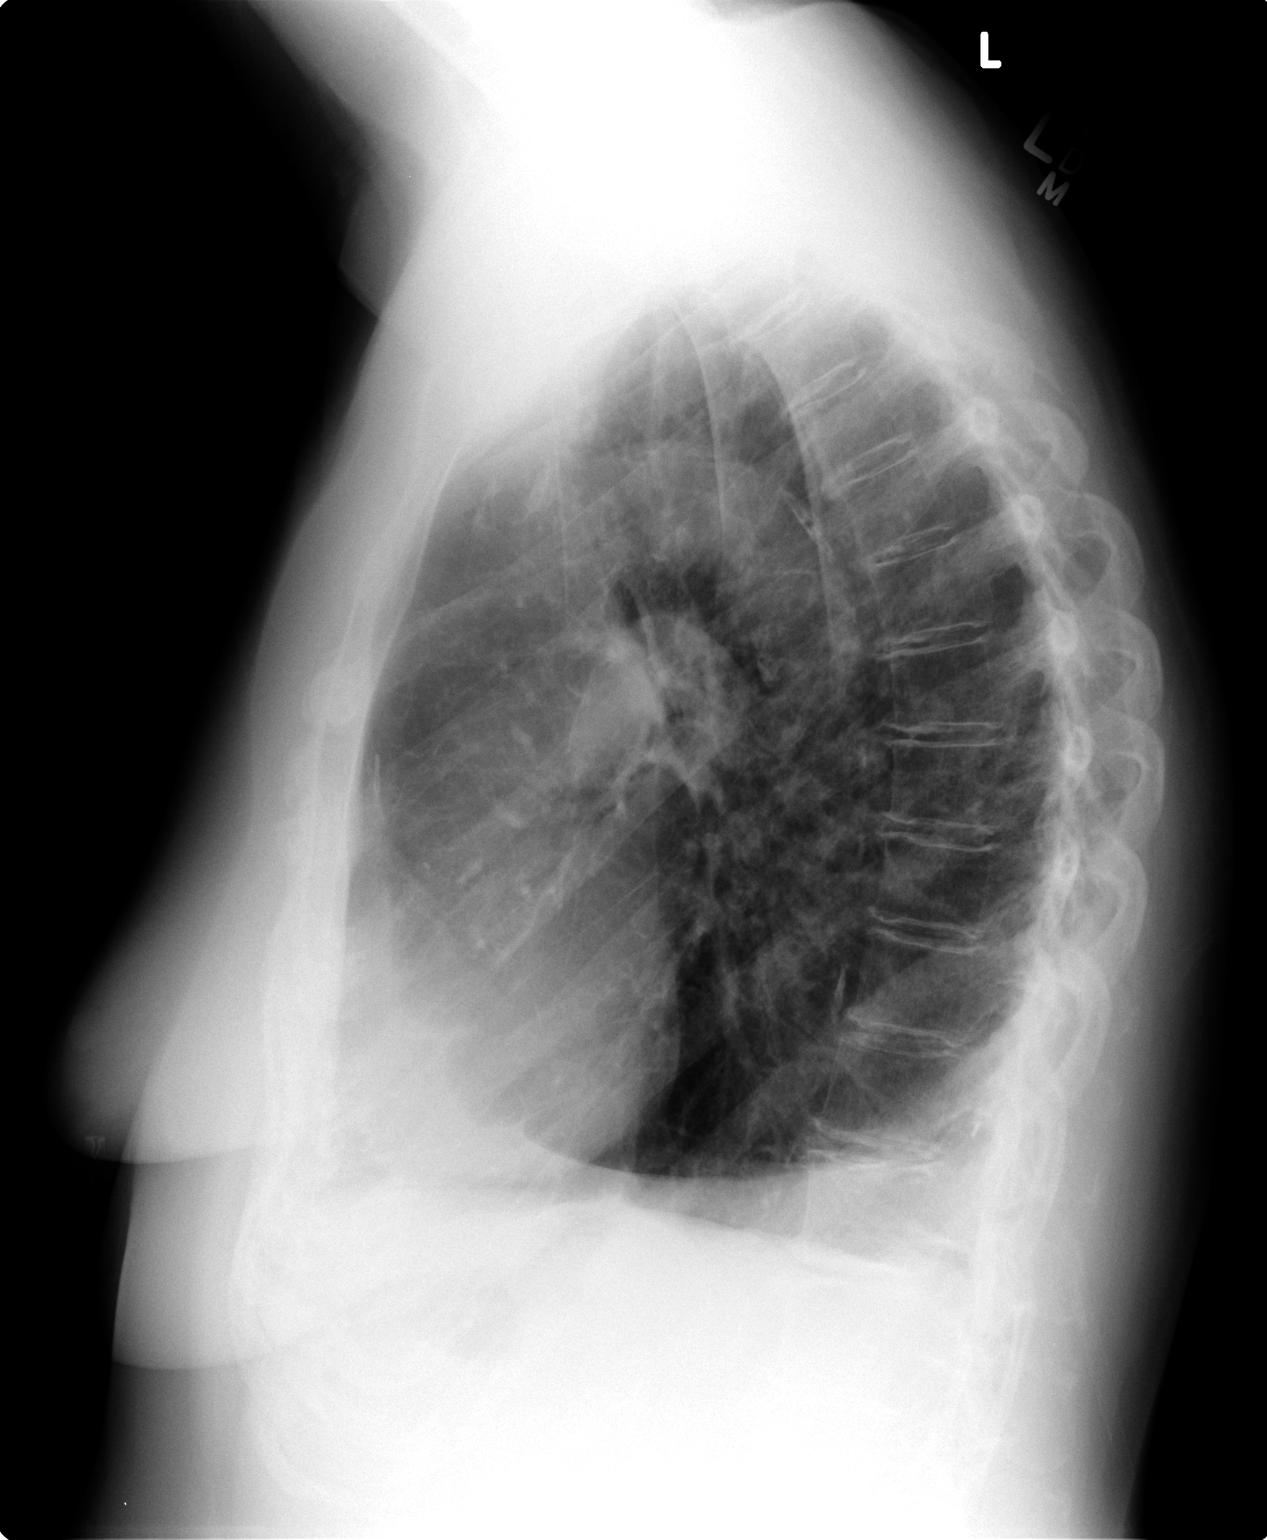

[2 of 2 positions shown; findings below may reference images not displayed]

FINDINGS: Mediastinum and hilar structures are normal. Surgical clips and
sutures noted over the right hilum. Prominent infiltrate nodularity
noted throughout the right lung. Prior right upper lobectomy. Heart
size normal. Pleural thickening on the right. No pneumothorax. No
acute bony abnormality.
IMPRESSION: 1. Diffuse infiltrates with nodularity in right lung again noted.
Findings most likely infectious including possible granulomatous
infection . Associated small right pleural effusion.

2.  Right upper lobectomy.

## 2016-02-02 IMAGING — CT CT CHEST W/ CM
2 of 4 series · 15 of 36 positions shown, 18 images · IV contrast (omnipaque)
Comparison: CT scan of April 08, 2014.

CLINICAL DATA: Pulmonary nodules.

EXAM:
CT CHEST WITH CONTRAST
TECHNIQUE: Multidetector CT imaging of the chest was performed during
intravenous contrast administration.
CONTRAST:  80mL OMNIPAQUE IOHEXOL 300 MG/ML  SOLN

[Series 2: chest routine with · axial · 0.65mm/px · z∈[-309,-34]mm · 12 of 65 slices shown, 15 images]
[im 5/65  mediastinal]
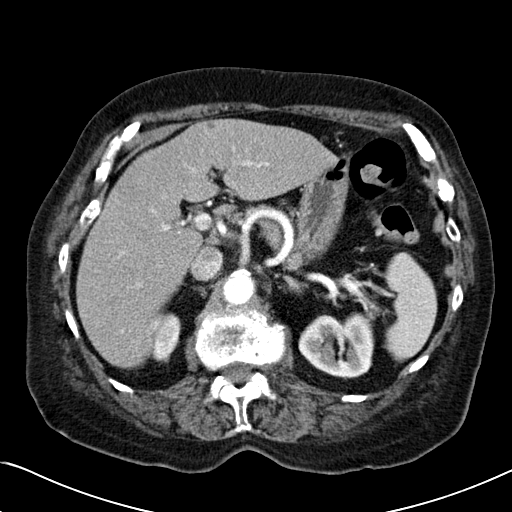
[im 5/65  lung]
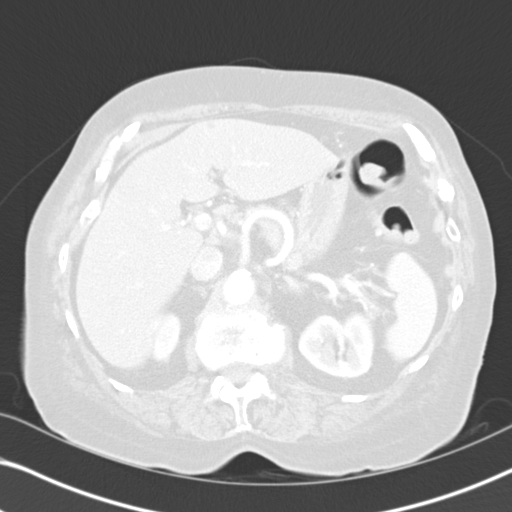
[im 10/65  lung]
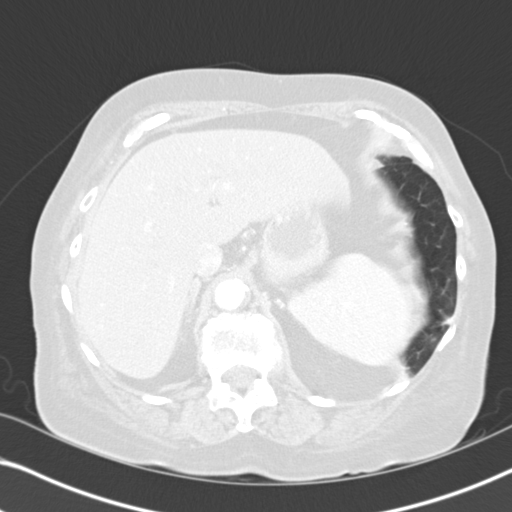
[im 15/65  lung]
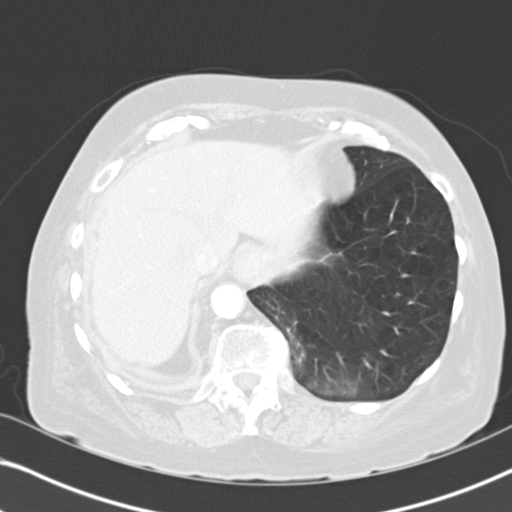
[im 20/65  lung]
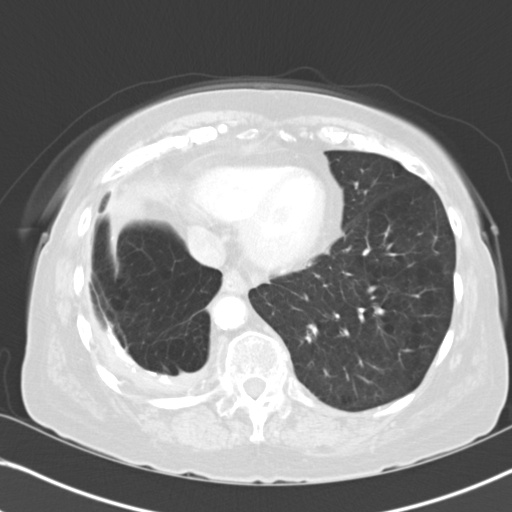
[im 25/65  mediastinal]
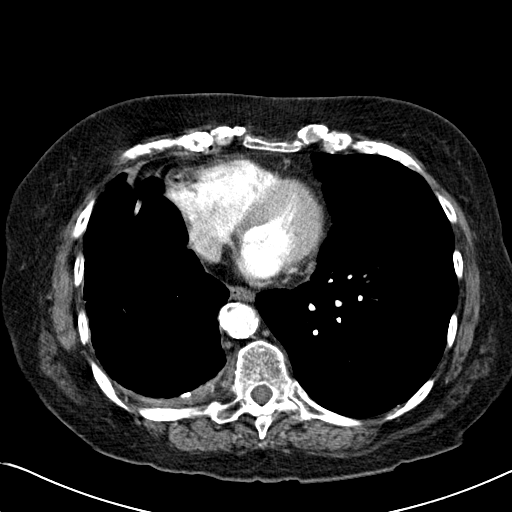
[im 25/65  lung]
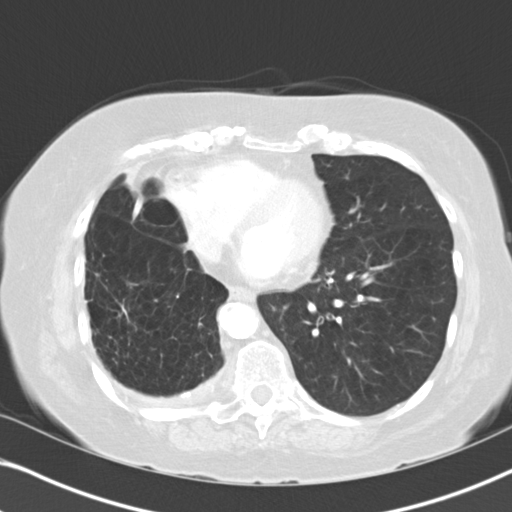
[im 30/65  lung]
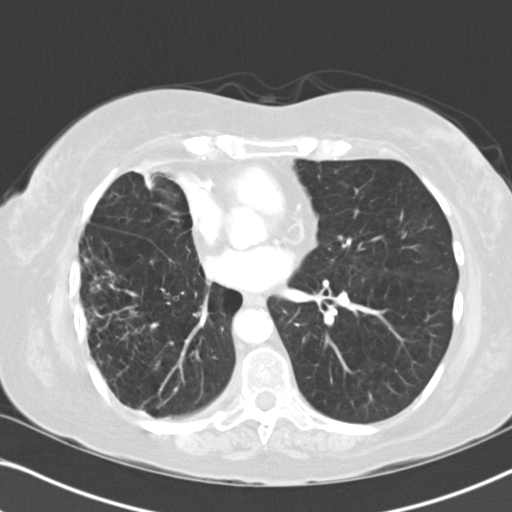
[im 35/65  lung]
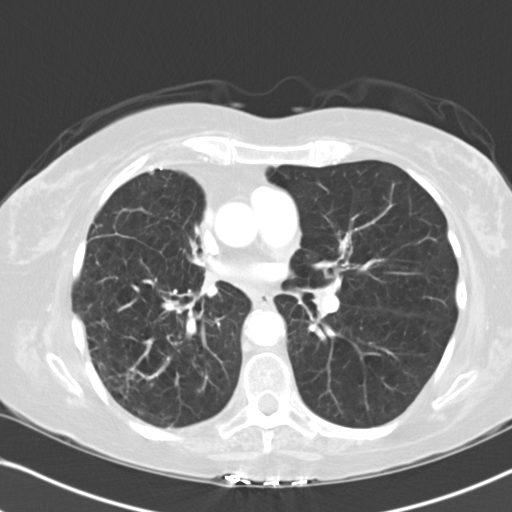
[im 40/65  lung]
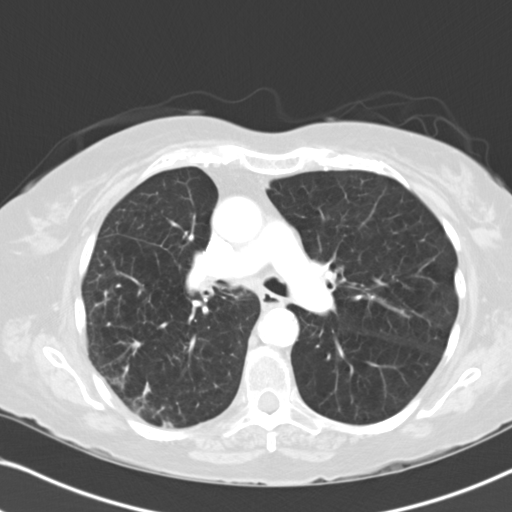
[im 45/65  mediastinal]
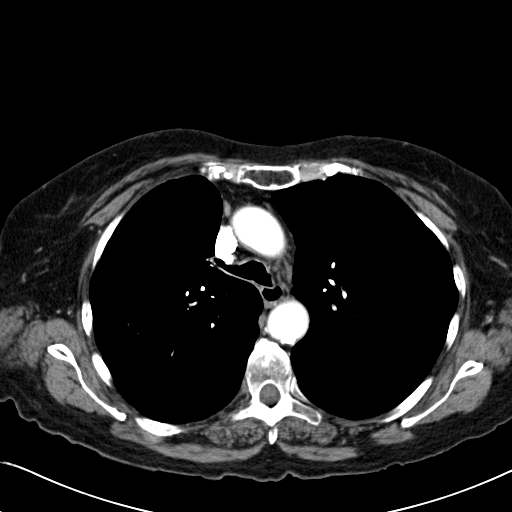
[im 45/65  lung]
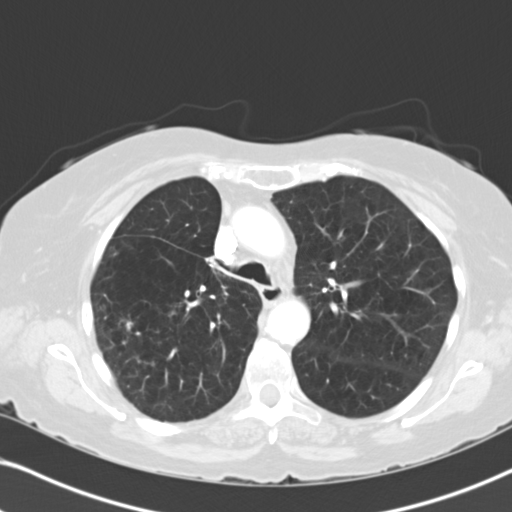
[im 50/65  lung]
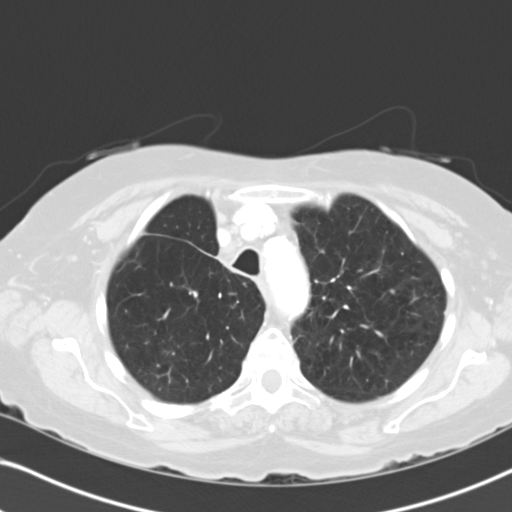
[im 55/65  lung]
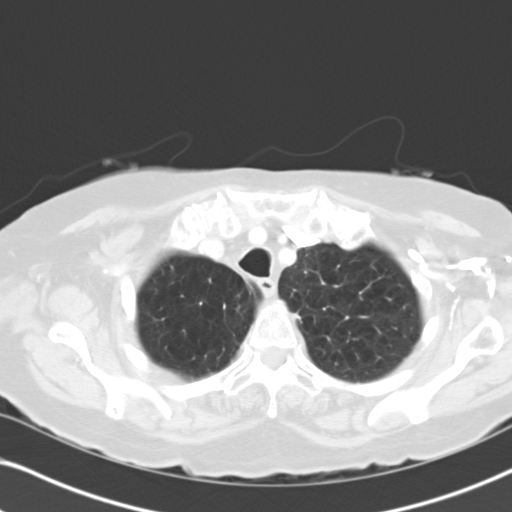
[im 60/65  lung]
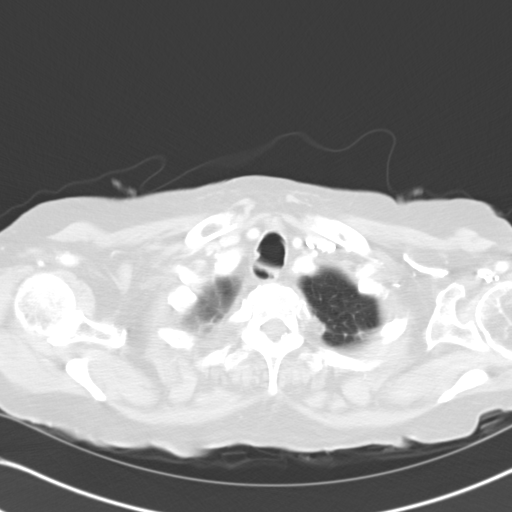

[Series 602: cor · coronal · 0.65mm/px · 3 of 108 slices shown]
[im 22/108  lung]
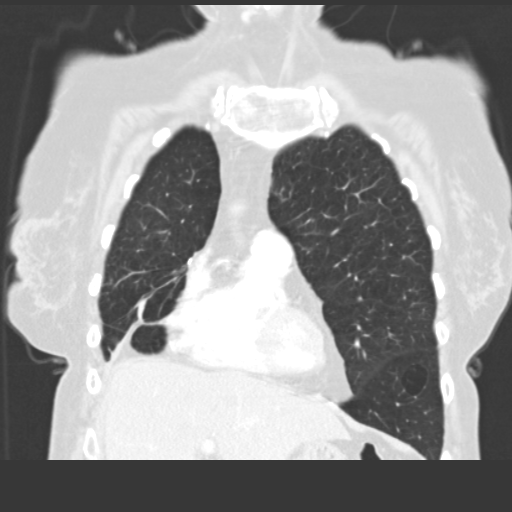
[im 43/108  lung]
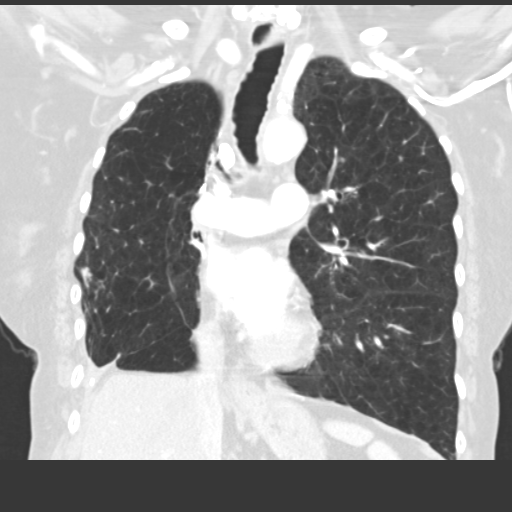
[im 65/108  lung]
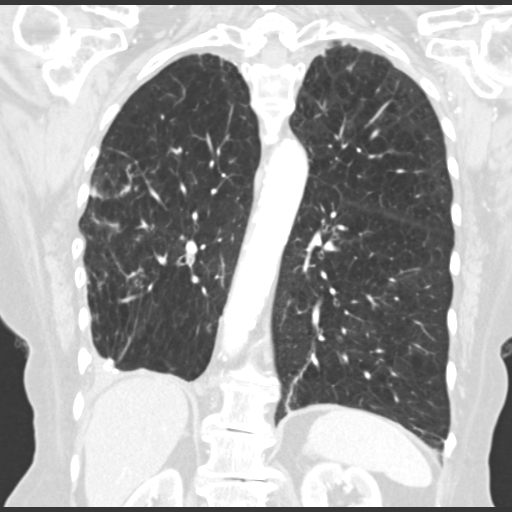

[15 of 36 positions shown; findings below may reference images not displayed]

FINDINGS: No pneumothorax or significant pleural effusion is noted. Stable
calcified pleural plaque is noted posteriorly in the right lung
base. Hyperexpansion of left lung is noted with mild emphysematous
change seen in the upper lobe. No acute abnormality seen in the left
lung.

Stable calcified abnormality seen in the right minor fissure
measuring 13 x 7 mm. Status post right upper lobectomy. Ill-defined
solid opacities noted on prior exam has significantly improved or
resolved and right lung base. 6 mm nodular density remains best seen
on image number 29. New 6 mm spiculated density is noted laterally
in the right lower lobe best seen on image number 35. New
ill-defined pleural-based opacity measuring 18 x 5 mm is noted
posteriorly in the right lower lobe best seen on image number 24.
New 7 mm subpleural nodule is noted posteriorly in the right lower
lobe best seen on image number 27. Reticular densities are noted in
right lung base most consistent with scarring.

Visualized portion of upper abdomen appears normal. There is no
evidence of thoracic aortic dissection or aneurysm. No significant
mediastinal mass or adenopathy is noted.
IMPRESSION: Patchy opacity seen in right lower lobe on prior exam appear to have
resolved or significantly improved. Residual 6 mm spiculated density
is noted laterally in the right lower lobe, as well as another 6 mm
nodular density also seen in right lower lobe. There is the interval
development of ill-defined pleural-based abnormality measuring 18 x
5 mm posteriorly in right lower lobe, as well as new 7 mm subpleural
nodule seen posteriorly in right lower lobe. Followup unenhanced CT
scan in 3 months is recommended to ensure stability or resolution.

## 2016-02-07 ENCOUNTER — Other Ambulatory Visit: Payer: Self-pay | Admitting: Internal Medicine

## 2016-02-08 ENCOUNTER — Ambulatory Visit (INDEPENDENT_AMBULATORY_CARE_PROVIDER_SITE_OTHER): Payer: PPO | Admitting: Internal Medicine

## 2016-02-08 ENCOUNTER — Encounter: Payer: Self-pay | Admitting: Student

## 2016-02-08 DIAGNOSIS — J9611 Chronic respiratory failure with hypoxia: Secondary | ICD-10-CM

## 2016-02-08 DIAGNOSIS — J449 Chronic obstructive pulmonary disease, unspecified: Secondary | ICD-10-CM | POA: Diagnosis not present

## 2016-02-08 MED ORDER — MOMETASONE FURO-FORMOTEROL FUM 100-5 MCG/ACT IN AERO: 2.0000 | INHALATION_SPRAY | Freq: Two times a day (BID) | RESPIRATORY_TRACT | 0 refills | Status: DC

## 2016-02-08 NOTE — Progress Notes (Signed)
Patient ID: [name], female    DOB: 10-19-1932, 82 y.o.   MRN: [id number]  HPI  female former smoker followed for history of right upper lobe cancer, COPD, allergic rhinitis       06/04/2015-82 year old female former smoker followed for history of right upper lobe resection/NSCCA, COPD, allergic rhinitis, complicated by glaucoma O2 2 L sleep, exertion/[facility]      flu vaccine UTD   husband here O2 sat sometimes drops when she is on long hose at home. Discussed turning it up to 3 when that happens. Feeling better since last prednisone taper 4 days ago. Occasional cramp right lower anterior rib cage. Occasional aching left arm and hand cramp, not exertional. No palpitation. Discussed chest x-ray image CXR 05/25/2015 IMPRESSION: 1. Stable right base postsurgical change with pleural parenchymal scarring. 2.Stable right mid lung field tiny pulmonary nodule. Electronically Signed: By: Marcello Moores Register On: 05/25/2015 16:53  06/11/2015-82 year old female former smoker followed for history right upper lobe resection/And SCCA, COPD, allergic rhinitis, consultative by glaucoma O2 3 L sleep, exertion/changd to [facility]     flu vaccine UTD    Pt c/o body aches, diarrhea x 1 day, chest tightness, CP, increased cough with green/yellow mucus production. Denies fever.   07/06/2015-82 year old female former smoker followed for history right upper lobe resection/NSCCa, COPD, allergic rhinitis, complicated by glaucoma O2 2 L sleep, exertion/[facility]  contiunous FOLLOWS FOR: Pt states she continues to have cough-productive-white and thick. Pt had to change DME for O2([employer] now) and would like to see a about getting an "oxy go" concentrator if possible. Room air oximetry at rest 90%. Oximetry walk test 07/06/2015-desaturation to 85% while walking on room air. Saturation sustained at 97% on 2 L prongs. Says at home using oxygen almost continuously-concentrator and portable tanks  which are too heavy for her. With ambulation on room air in the home she desaturates to 86% within a few steps. Cough productive white with increased wheeze in the last 2 weeks  10/08/2015-82 year old female former smoker followed for history right upper lobe resection/NSSCA, COPD, allergic rhinitis, complicated by glaucoma                  Husband here O2 2 L/[facility] FOLLOWS FOR: Pt states her lungs feel full and gets worse x 1 week. SOB and wheezing as well. Coughing-productive-thick and white in color. Spells of worse coughing with productive thick white sputum. She is using her medicines. Does not feel she has had a cold. Some dependent ankle edema. Occasional nonexertional mid chest pain seems tussive by description.  8/8//5376-82 year old female former smoker followed for history of right upper lobe resection/NSSCA, COPD, chronic hypoxic respiratory failure, allergic rhinitis, complicated by glaucoma O2 2 L/[facility] Distinct flare of COPD/bronchitis in June after irritant odor exposure-resolved. Humid summer weather bothering her. Cough productive clear/white. Using nebulizer more. Qvar definitely helps. Husband pushes her in wheelchair if going any distance.  ROS-see HPI   Negative unless "+" Constitutional:    weight loss, night sweats, fevers, chills, fatigue, lassitude. HEENT:    headaches, difficulty swallowing, tooth/dental problems, sore throat,       sneezing, itching, ear ache, nasal congestion, post nasal drip, snoring CV:    + chest pain, orthopnea, PND, swelling in lower extremities, anasarca,  dizziness, palpitations Resp:   + shortness of breath with exertion or at rest.               + productive cough,   non-productive cough, coughing up of blood.              change in color of mucus.  wheezing.   Skin:    rash or lesions. GI:  No-   heartburn, indigestion, abdominal pain, nausea, vomiting,  GU:  MS:   joint pain,  stiffness, . Neuro-     nothing unusual Psych:  change in mood or affect.  depression or anxiety.   memory loss.   Objective:   OBJ- Physical Exam General- Alert, Oriented, Affect-appropriate, Distress- none acute, + Wearing O2 2 L pulse                 saturation 94% room air at rest (left oxygen in waiting room) Skin- rash-none, lesions- none, excoriation- none Lymphadenopathy- none Head- atraumatic            Eyes- Gross vision intact, PERRLA, conjunctivae and secretions clear            Ears- Hearing, canals-normal            Nose- Clear, no-Septal dev, mucus, polyps, erosion, perforation             Throat- Mallampati II , mucosa clear , drainage- none, tonsils- atrophic Neck- flexible , trachea midline, no stridor , thyroid nl, carotid no bruit Chest - symmetrical excursion , unlabored           Heart/CV- RRR , no murmur , no gallop  , no rub, nl s1 s2                           - JVD- none , edema- none, stasis changes- none, varices- none           Lung-  wheeze + bilateral/unlabored, cough -none , dullness-none, rub- none           Chest wall-  Abd-  Br/ Gen/ Rectal- Not done, not indicated Extrem- cyanosis- none, clubbing, none, atrophy- none, strength- nl Neuro- grossly intact to observation

## 2016-02-08 NOTE — Patient Instructions (Addendum)
Sample Dulera 100   Inhale 2 puffs then rinse mouth, twice daily   Try this for now instead of Qvar and see if you get better control.  If you like the [facility] please let us know so we can call in a prescription.

## 2016-02-23 NOTE — Assessment & Plan Note (Addendum)
She feels stable now after exacerbation this spring from an irritant exposure. She is not really aware of her wheezing so I'm not sure if this represents an exacerbation or her maintenance state now.. Plan-try advancing from Qvar to sample Dulera 100

## 2016-02-23 NOTE — Assessment & Plan Note (Signed)
She remains dependent on oxygen without increase in demand. Uses continuously.

## 2016-02-24 ENCOUNTER — Other Ambulatory Visit: Payer: Self-pay | Admitting: Internal Medicine

## 2016-02-25 ENCOUNTER — Telehealth: Payer: Self-pay | Admitting: Internal Medicine

## 2016-02-25 MED ORDER — BECLOMETHASONE DIPROPIONATE 80 MCG/ACT IN AERS: 2.0000 | INHALATION_SPRAY | Freq: Two times a day (BID) | RESPIRATORY_TRACT | 12 refills | Status: DC

## 2016-02-25 NOTE — Telephone Encounter (Signed)
Spoke with pt. States that CY gave her samples of Dulera at her last OV. Reports that Ruthe Mannan is making her jittery and she didn't feel like she could "empty" her lungs. Pt went back on Qvar since she had some at her home.  CY - please advise. Thanks.

## 2016-02-25 NOTE — Telephone Encounter (Signed)
Spoke with pt. She is aware of CY's recommendation. Pt prefers to go back on Qvar due to being able to cough up mucus after she uses it.  CY - please advise. Thanks.

## 2016-02-25 NOTE — Telephone Encounter (Signed)
Called spoke with pt. Reviewed CY's results and recs. Verified pharmacy [facility] on [address]. She voiced understanding and had no further questions. rx sent, nothing further needed.

## 2016-02-25 NOTE — Telephone Encounter (Signed)
Ok then please remove Dulera from list and return to her Q var   # 1, inhale 2 puffs then rinse mouth, twice daily, ref  12

## 2016-02-25 NOTE — Telephone Encounter (Signed)
Patient requesting refill on Qvar.  Patient was changed from Qvar to [facility] at last OV.  Ok to switch back to Qvar?   Please advise.

## 2016-02-25 NOTE — Telephone Encounter (Signed)
Suggest she try changing Dulera to 1 puff, twice daily. See if that helps wheeze with less jitter.

## 2016-02-26 NOTE — Telephone Encounter (Signed)
Ok to refill Qvar x 1 year

## 2016-03-09 ENCOUNTER — Telehealth: Payer: Self-pay | Admitting: Internal Medicine

## 2016-03-09 MED ORDER — PREDNISONE 10 MG PO TABS: ORAL_TABLET | ORAL | 0 refills | Status: DC

## 2016-03-09 NOTE — Telephone Encounter (Signed)
lmtcb X1 for pt  

## 2016-03-09 NOTE — Telephone Encounter (Signed)
Spoke with pt, aware of results/recs.  RX sent to preferred pharmacy.  Nothing further needed.

## 2016-03-09 NOTE — Telephone Encounter (Signed)
Patient returning call - pr

## 2016-03-09 NOTE — Telephone Encounter (Signed)
Spoke with pt, c/o worsening sob, prod cough with white mucus, sinus congestion X2-3 days.  Denies chest pain, fever.    Pt requesting prednisone taper.  [facility] [address]    Sending message to DOD as CY is unavailable this afternoon.  PM please advise on pred taper.  Thanks!   [name]

## 2016-03-09 NOTE — Telephone Encounter (Signed)
Prednisone 40 mg. Reduce dose by 10 mg every 2 days.

## 2016-03-17 ENCOUNTER — Other Ambulatory Visit: Payer: Self-pay | Admitting: Internal Medicine

## 2016-04-06 ENCOUNTER — Ambulatory Visit: Payer: PPO | Admitting: Internal Medicine

## 2016-04-08 ENCOUNTER — Encounter: Payer: Self-pay | Admitting: Internal Medicine

## 2016-04-08 ENCOUNTER — Ambulatory Visit (INDEPENDENT_AMBULATORY_CARE_PROVIDER_SITE_OTHER): Payer: PPO | Admitting: Internal Medicine

## 2016-04-08 VITALS — BP 138/84 | HR 83 | Temp 97.9°F | Resp 20 | Ht 66.0 in | Wt 154.0 lb

## 2016-04-08 DIAGNOSIS — M7072 Other bursitis of hip, left hip: Secondary | ICD-10-CM

## 2016-04-08 DIAGNOSIS — G8929 Other chronic pain: Secondary | ICD-10-CM

## 2016-04-08 DIAGNOSIS — M7071 Other bursitis of hip, right hip: Secondary | ICD-10-CM

## 2016-04-08 DIAGNOSIS — M25562 Pain in left knee: Secondary | ICD-10-CM

## 2016-04-08 DIAGNOSIS — M25561 Pain in right knee: Secondary | ICD-10-CM | POA: Diagnosis not present

## 2016-04-08 DIAGNOSIS — M7062 Trochanteric bursitis, left hip: Secondary | ICD-10-CM

## 2016-04-08 DIAGNOSIS — M545 Low back pain: Secondary | ICD-10-CM | POA: Diagnosis not present

## 2016-04-08 DIAGNOSIS — M7061 Trochanteric bursitis, right hip: Secondary | ICD-10-CM | POA: Insufficient documentation

## 2016-04-08 DIAGNOSIS — Z23 Encounter for immunization: Secondary | ICD-10-CM | POA: Diagnosis not present

## 2016-04-08 NOTE — Assessment & Plan Note (Signed)
X-ray earlier this year showed mod-severe arthritis She will try taking the tylenol arthritis 1-2 times a day Will start doing exercises at home regularly Call if no improvement or if she would like a referral

## 2016-04-08 NOTE — Progress Notes (Signed)
Subjective:    Patient ID: [name], female    DOB: 1932-09-20, 82 y.o.   MRN: [id number]  HPI She is here for an acute visit.  She has arthritis, but her symptoms have gotten worse in the past couple of weeks and she is unsure why.  Tylenol arthritis helps - she only takes it once a day.      She was concerned about it more because a couple of weeks ago when she got up to walk her right leg start to give way. That has happened a few times.  Her hips are sore on the lateral aspect bilaterally. She started using a cane.  She still feels wobbly when she walks, but she thinks it is getting better.  She denies any groin pain or hip pain with walking.   She has soreness in her lower back.  Her knees ache and they feel warm when they ache.  She denies knee swelling.  There has been no redness in her knees.    She has lower back pain.  It is sore when she stands for long periods of time.  Medications and allergies reviewed with patient and updated if appropriate.  Patient Active Problem List   Diagnosis Date Noted  . Dysuria 12/02/2015  . Hip pain 12/02/2015  . Chest pain 06/05/2015  . Chronic respiratory failure with hypoxia ([place]) 03/07/2015  . CAP (community acquired pneumonia) 12/22/2014  . Hypotension 12/22/2014  . Oral ulcer 11/24/2014  . Diverticulitis 05/24/2014  . Anxiety   . Bladder prolapse, female, acquired   . Arthritis   . Non-small cell carcinoma of lung, stage 1 ([place]) 01/10/2012  . Neuropathy, peripheral ([place]) 01/28/2011  . COPD mixed type ([place]) 09/08/2010  . Dyslipidemia   . ATONY OF BLADDER 08/10/2009  . Hypothyroidism 03/12/2008  . Seasonal allergic rhinitis 10/10/2007    Current Outpatient Prescriptions on File Prior to Visit  Medication Sig Dispense Refill  . acetaminophen (TYLENOL ARTHRITIS PAIN) 650 MG CR tablet Take 650 mg by mouth daily as needed for pain.     [name] albuterol (PROVENTIL HFA;VENTOLIN HFA) 108 (90 Base) MCG/ACT inhaler Inhale 2 puffs  into the lungs every 6 (six) hours as needed for wheezing or shortness of breath. 1 Inhaler 12  . ALPRAZolam (XANAX) 1 MG tablet TAKE 1 TABLET BY MOUTH AT BEDTIME 30 tablet 2  . aspirin EC 81 MG tablet Take 81 mg by mouth 2 (two) times a week.     [name] atorvastatin (LIPITOR) 40 MG tablet Take 1 tablet (40 mg total) by mouth daily. (Patient taking differently: Take 20 mg by mouth every other day. At [facility]) 90 tablet 3  . Cholecalciferol (VITAMIN D) 2000 UNITS tablet Take 1 tablet (2,000 Units total) by mouth daily. 30 tablet 11  . levalbuterol (XOPENEX) 1.25 MG/3ML nebulizer solution USE 1 VIAL IN NEBULIZER EVERY 8 HOURS AS NEEDED 72 mL 3  . levothyroxine (SYNTHROID, LEVOTHROID) 100 MCG tablet Take 1 tablet (100 mcg total) by mouth daily before breakfast. 90 tablet 1  . Multiple Vitamins-Minerals (CENTRUM SILVER PO) Take 1 tablet by mouth daily.     . predniSONE (DELTASONE) 10 MG tablet '40mg'$ X2 days, '30mg'$  X2 days, '20mg'$  X2 days, '10mg'$ X2 days, then stop. 20 tablet 0  . QVAR 80 MCG/ACT inhaler INHALE 2 PUFFS BY MOUTH INTO THE LUNGS 2 (TWO) TIMES DAILY. 8.7 g 11  . Respiratory Therapy Supplies (FLUTTER) DEVI Blow through 4 times per cycle and repeat 3 cycles per  day 1 each 0   No current facility-administered medications on file prior to visit.     Past Medical History:  Diagnosis Date  . Allergic rhinitis   . Anxiety   . Arthritis    "back, hands; hips" (12/22/2014)  . Asthma   . Bladder atony   . Chronic bronchitis ([place])   . COPD (chronic obstructive pulmonary disease) ([place])   . Diarrhea   . Emphysema of lung ([place])   . GERD (gastroesophageal reflux disease)    "w/spicey foods" (12/22/2014)  . Hyperlipidemia   . Non-small cell carcinoma of lung, stage 1 ([place]) 2005   1.4 cm Poorly differentiated Squamous cell RUL  T1N0 resected 09/22/2003  . On home oxygen therapy    "3L q night and prn during the day" (12/22/2014)  . Pelvic cyst   . Pneumonia    "this is the 3rd time that I can remember" (12/22/2014)   . Unspecified hypothyroidism     Past Surgical History:  Procedure Laterality Date  . ABDOMINAL HYSTERECTOMY  ~ 1976  . BACK SURGERY    . CATARACT EXTRACTION W/ INTRAOCULAR LENS  IMPLANT, BILATERAL Bilateral ~ 2012  . DILATION AND CURETTAGE OF UTERUS    . LUMBAR DISC SURGERY  1970's  . LYMPH NODE DISSECTION  2005   "all my lymph nodes under breasts, went thru my back; Dr. Arlyce Dice did it"  . Needle aspiration Pelvic cyst  2011   Dr Diona Fanti  . VIDEO ASSISTED THORACOSCOPY (VATS)/THOROCOTOMY Right 09/2003   thoracotomy, right upper lobectomy with node dissection; Dr Demetrios Loll 11/02/2010  . VIDEO BRONCHOSCOPY  03/2004   Archie Endo 11/02/2010    Social History   Social History  . Marital status: Married    Spouse name: N/A  . Number of children: N/A  . Years of education: N/A   Occupational History  . Retired Animator, School system Retired   Social History Main Topics  . Smoking status: Former Smoker    Packs/day: 1.00    Years: 42.00    Types: Cigarettes    Quit date: 09/22/2003  . Smokeless tobacco: Never Used  . Alcohol use 8.4 oz/week    14 Glasses of wine per week     Comment: 12/22/2014 "I have a large glass of wine qd"  . Drug use: No  . Sexual activity: No   Other Topics Concern  . Not on file   Social History Narrative  . No narrative on file    Family History  Problem Relation Age of Onset  . Heart disease Father   . Rheumatologic disease Brother   . Cancer Brother     Leukemia  . Rheumatologic disease Sister     Review of Systems  Constitutional: Negative for fever.  Respiratory: Positive for cough (chronic, unchanged) and shortness of breath (chronic, unchanged).   Musculoskeletal: Positive for arthralgias and gait problem. Negative for joint swelling.  Neurological: Positive for weakness (hips,legs). Negative for numbness.       Objective:   Vitals:   04/08/16 1108  BP: 138/84  Pulse: 83  Resp: 20  Temp: 97.9 F (36.6 C)   Filed  Weights   04/08/16 1108  Weight: 154 lb (69.9 kg)   Body mass index is 24.86 kg/m.   Physical Exam  Constitutional: She appears well-developed and well-nourished. No distress.  Musculoskeletal: She exhibits no edema.  Tenderness with palpation lower lumbar and sacral area, tenderness b/l lateral aspects of hips, no knee tenderness/swelling or redness b/l  Skin: Skin is warm and dry. No rash noted. She is not diaphoretic. No erythema.  Psychiatric: She has a normal mood and affect. Her behavior is normal.        Assessment & Plan:   See Problem List for Assessment and Plan of chronic medical problems.

## 2016-04-08 NOTE — Assessment & Plan Note (Signed)
Bilateral lateral hip pain likely bursitis Symptoms have improved slightly Discussed PT, referral to sports medication/possible injection She will try taking the tylenol arthritis 1-2 times a day Will try topical otc medications for arthritis Will start doing exercises at home regularly Call if no improvement or if she would like a referral

## 2016-04-08 NOTE — Assessment & Plan Note (Signed)
Likely osteoarthritis, no sign on exam concerning for inflammatory arthritis Discussed PT, referral to sports medication/possible injection - deferred referral today She will try taking the tylenol arthritis 1-2 times a day Will try topical otc medications for arthritis Will start doing exercises at home regularly Call if no improvement or if she would like a referral

## 2016-04-08 NOTE — Progress Notes (Signed)
Pre visit review using our clinic review tool, if applicable. No additional management support is needed unless otherwise documented below in the visit note. 

## 2016-04-08 NOTE — Patient Instructions (Addendum)
Start doing the physical therapy exercises at home.  Continue the tylenol arthritis as needed - you can take it up to three times a day.    If there is no improvement we should consider a referral to orthopedics or physical therapy.    Hip Bursitis Bursitis is a swelling and soreness (inflammation) of a fluid-filled sac (bursa). This sac overlies and protects the joints.  CAUSES   Injury.  Overuse of the muscles surrounding the joint.  Arthritis.  Gout.  Infection.  Cold weather.  Inadequate warm-up and conditioning prior to activities. The cause may not be known.  SYMPTOMS   Mild to severe irritation.  Tenderness and swelling over the outside of the hip.  Pain with motion of the hip.  If the bursa becomes infected, a fever may be present. Redness, tenderness, and warmth will develop over the hip. Symptoms usually lessen in 3 to 4 weeks with treatment, but can come back. TREATMENT If conservative treatment does not work, your caregiver may advise draining the bursa and injecting cortisone into the area. This may speed up the healing process. This may also be used as an initial treatment of choice. HOME CARE INSTRUCTIONS   Apply ice to the affected area for 15-20 minutes every 3 to 4 hours while awake for the first 2 days. Put the ice in a plastic bag and place a towel between the bag of ice and your skin.  Rest the painful joint as much as possible, but continue to put the joint through a normal range of motion at least 4 times per day. When the pain lessens, begin normal, slow movements and usual activities to help prevent stiffness of the hip.  Only take over-the-counter or prescription medicines for pain, discomfort, or fever as directed by your caregiver.  Use crutches to limit weight bearing on the hip joint, if advised.  Elevate your painful hip to reduce swelling. Use pillows for propping and cushioning your legs and hips.  Gentle massage may provide comfort  and decrease swelling. SEEK IMMEDIATE MEDICAL CARE IF:   Your pain increases even during treatment, or you are not improving.  You have a fever.  You have heat and inflammation over the involved bursa.  You have any other questions or concerns. MAKE SURE YOU:   Understand these instructions.  Will watch your condition.  Will get help right away if you are not doing well or get worse.   This information is not intended to replace advice given to you by your health care provider. Make sure you discuss any questions you have with your health care provider.   Document Released: 11/26/2001 Document Revised: 08/29/2011 Document Reviewed: 01/06/2015 Elsevier Interactive Patient Education [employer].

## 2016-04-21 ENCOUNTER — Other Ambulatory Visit: Payer: Self-pay | Admitting: Internal Medicine

## 2016-04-21 NOTE — Telephone Encounter (Signed)
Rx faxed to pharmacy  

## 2016-05-19 ENCOUNTER — Telehealth: Payer: Self-pay | Admitting: Family Medicine

## 2016-05-19 NOTE — Telephone Encounter (Signed)
Patient has appt 12/19 to see Dr. Tamala Julian.  Patient needs [facility] referral.  Patient is also requesting to see Dr. Tamala Julian sooner.

## 2016-05-20 NOTE — Telephone Encounter (Signed)
Spoke to pt, rescheduled her for 12.12.17 @ 11:30am.

## 2016-05-30 NOTE — Progress Notes (Signed)
[name] [address], [place] [address] Phone: [phone] Subjective:    I'm seeing this patient by the request  of:  Binnie Rail, MD   CC: Hip pain  PPI:[id number]  [name] is a 82 y.o. female coming in with complaint of bilateral hip pain. Seems to be more on the lateral aspect of the hips. Saw primary care provider. Patient states Having pain more on the lateral aspect of the hips. Seems to be from the back as well though. Patient states that she feels unstable on her legs. Using a cane more recently. Patient states that she did do physical therapy for her balance and coordination previously but stopped doing the exercises. Patient does notice some weakness in the legs but states that that seems to be's about the same since the injury. Pain though seems to be more on the lateral aspect of the hip. Minimal groin pain with it. Can wake her up at night. Not responding to over-the-counter medications well.    Patient did have x-rays of the hips bilaterally June 2017. 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 Past Medical History:  Diagnosis Date  . Allergic rhinitis   . Anxiety   . Arthritis    "back, hands; hips" (12/22/2014)  . Asthma   . Bladder atony   . Chronic bronchitis ([place])   . COPD (chronic obstructive pulmonary disease) ([place])   . Diarrhea   . Emphysema of lung ([place])   . GERD (gastroesophageal reflux disease)    "w/spicey foods" (12/22/2014)  . Hyperlipidemia   . Non-small cell carcinoma of lung, stage 1 ([place]) 2005   1.4 cm Poorly differentiated Squamous cell RUL  T1N0 resected 09/22/2003  . On home oxygen therapy    "3L q night and prn during the day" (12/22/2014)  . Pelvic cyst   . Pneumonia    "this is the 3rd time that I can remember" (12/22/2014)  . Unspecified hypothyroidism    Past Surgical History:  Procedure Laterality Date  . ABDOMINAL HYSTERECTOMY  ~  1976  . BACK SURGERY    . CATARACT EXTRACTION W/ INTRAOCULAR LENS  IMPLANT, BILATERAL Bilateral ~ 2012  . DILATION AND CURETTAGE OF UTERUS    . LUMBAR DISC SURGERY  1970's  . LYMPH NODE DISSECTION  2005   "all my lymph nodes under breasts, went thru my back; Dr. Arlyce Dice did it"  . Needle aspiration Pelvic cyst  2011   Dr Diona Fanti  . VIDEO ASSISTED THORACOSCOPY (VATS)/THOROCOTOMY Right 09/2003   thoracotomy, right upper lobectomy with node dissection; Dr Demetrios Loll 11/02/2010  . VIDEO BRONCHOSCOPY  03/2004   Archie Endo 11/02/2010   Social History   Social History  . Marital status: Married    Spouse name: N/A  . Number of children: N/A  . Years of education: N/A   Occupational History  . Retired Animator, School system Retired   Social History Main Topics  . Smoking status: Former Smoker    Packs/day: 1.00    Years: 42.00    Types: Cigarettes    Quit date: 09/22/2003  . Smokeless tobacco: Never Used  . Alcohol use 8.4 oz/week    14 Glasses of wine per week     Comment: 12/22/2014 "I have a large glass of wine qd"  . Drug use: No  . Sexual activity: No   Other Topics Concern  . None   Social History  Narrative  . None   Allergies  Allergen Reactions  . Daliresp [Roflumilast] Nausea Only  . Tiotropium Bromide Monohydrate     Urinary retention  . Ampicillin Diarrhea and Nausea And Vomiting    GI upset  . Azithromycin Diarrhea and Nausea And Vomiting  . Codeine Nausea And Vomiting    Makes her "crazy"  . Fludrocortisone Acetate Itching  . Lactose Intolerance (Gi) Other (See Comments)    Gas and bloating   . Milk-Related Compounds Other (See Comments)    Gas and bloating  . Nitrofurantoin Diarrhea and Nausea And Vomiting    Upset GI  . Other Other (See Comments)    No nuts and seeds because of diverticulitis  . Penicillins Nausea And Vomiting    Has patient had a PCN reaction causing immediate rash, facial/tongue/throat swelling, SOB or lightheadedness with  hypotension: Yes Has patient had a PCN reaction causing severe rash involving mucus membranes or skin necrosis: No Has patient had a PCN reaction that required hospitalization No Has patient had a PCN reaction occurring within the last 10 years: No No "cillins" If all of the above answers are "NO", then may proceed with Cephalosporin use.   . Prednisone Other (See Comments)    Insomnia after 2 days  . Sulfonamide Derivatives Diarrhea and Nausea And Vomiting   Family History  Problem Relation Age of Onset  . Heart disease Father   . Rheumatologic disease Brother   . Cancer Brother     Leukemia  . Rheumatologic disease Sister     Past medical history, social, surgical and family history all reviewed in electronic medical record.  No pertanent information unless stated regarding to the chief complaint.   Review of Systems:Review of systems updated and as accurate as of 05/31/16  No headache, visual changes, nausea, vomiting, diarrhea, dizziness, abdominal pain, skin rash, fevers, chills, night sweats, weight loss, swollen lymph nodes,, mood changes. Patient is positive for chronic shortness of breath wears oxygen  Objective  Blood pressure 124/70, pulse 87, height '5\' 5"'$  (1.651 m), SpO2 98 %. Systems examined below as of 05/31/16   General: No apparent distress alert and oriented x3 mood and affect normal, dressed appropriately. Wearing oxygen. HEENT: Pupils equal, extraocular movements intact  Respiratory: Patient's speak in full sentences and does not appear short of breath  Cardiovascular: No lower extremity edema, non tender, no erythema  Skin: Warm dry intact with no signs of infection or rash on extremities or on axial skeleton.  Abdomen: Soft nontender  Neuro: Cranial nerves II through XII are intact, neurovascularly intact in all extremities with 2+ DTRs and 2+ pulses.  Lymph: No lymphadenopathy of posterior or anterior cervical chain or axillae bilaterally.  Gait Antalgic  gait. Atrophy of the legs bilaterally MSK:  Non tender with full range of motion and good stability and symmetric strength and tone of shoulders, elbows, wrist,  knee and ankles bilaterally. Arthritic changes of multiple joints  VWP:[id number] ROM Range of motion is limited in all planes bilaterally by 5. Discomfort over the greater trochanter area right greater than left Strength 4-5 but symmetric Pelvic alignment unremarkable to inspection and palpation. Greater trochanter without tenderness to palpation. No pain over the piriformis. Positive pain with Faber bilaterally No SI joint tenderness and normal minimal SI movement. Moderate tenderness over the paraspinal muscle trauma the lumbar spine right greater than left.   Impression and Recommendations:     This case required medical decision making of moderate complexity.  Note: This dictation was prepared with Dragon dictation along with smaller phrase technology. Any transcriptional errors that result from this process are unintentional.

## 2016-05-31 ENCOUNTER — Encounter: Payer: Self-pay | Admitting: Family Medicine

## 2016-05-31 ENCOUNTER — Ambulatory Visit (INDEPENDENT_AMBULATORY_CARE_PROVIDER_SITE_OTHER): Payer: PPO | Admitting: Family Medicine

## 2016-05-31 DIAGNOSIS — M4716 Other spondylosis with myelopathy, lumbar region: Secondary | ICD-10-CM

## 2016-05-31 DIAGNOSIS — M7061 Trochanteric bursitis, right hip: Secondary | ICD-10-CM

## 2016-05-31 DIAGNOSIS — M7062 Trochanteric bursitis, left hip: Secondary | ICD-10-CM

## 2016-05-31 NOTE — Patient Instructions (Signed)
Good to see you  pennsaid pinkie amount topically 2 times daily as needed.  Vitamin D 4000 IU daily  Turmeric '500mg'$  daily  Tart cherry extract any dose at night COntinue to be active Start those exercises again 3 times a week.  Use the cane when outside the house.  See me again in 4 weeks.  Happy holidays!

## 2016-05-31 NOTE — Assessment & Plan Note (Signed)
Likely more of a bursitis bilaterally. Possible bone contusion. Discussed over-the-counter medications given trial of topical anti-inflammatories. Worsening symptoms we'll consider injection. Decline formal physical therapy for strengthening. We will continue to monitor. Could be also secondary to lumbar radiculopathy and we'll need to consider gabapentin.

## 2016-05-31 NOTE — Assessment & Plan Note (Signed)
I believe the patient is having some signs and symptoms of spinal stenosis. Atrophy of the leg muscles noted today. Patient does have an antalgic gait and think is secondary to weakness. We discussed possible repeat imaging with possible injection which patient declined today. Encourage patient to do the home exercises that she's been given previously from an outside facility. Declined physical therapy. Patient declined any other prescription medications today. We discussed icing regimen. We'll continue to be active. Follow-up again in 4 weeks.

## 2016-06-07 ENCOUNTER — Ambulatory Visit: Payer: PPO | Admitting: Family Medicine

## 2016-06-28 ENCOUNTER — Ambulatory Visit: Payer: PPO | Admitting: Family Medicine

## 2016-07-12 ENCOUNTER — Ambulatory Visit (INDEPENDENT_AMBULATORY_CARE_PROVIDER_SITE_OTHER): Payer: PPO

## 2016-07-12 ENCOUNTER — Other Ambulatory Visit: Payer: Self-pay | Admitting: Internal Medicine

## 2016-07-12 VITALS — BP 140/62 | HR 87 | Ht 65.0 in | Wt 153.0 lb

## 2016-07-12 DIAGNOSIS — Z0000 Encounter for general adult medical examination without abnormal findings: Secondary | ICD-10-CM | POA: Diagnosis not present

## 2016-07-12 NOTE — Telephone Encounter (Signed)
Patient in for AWV today, requesting Lipitor 20 mg, #90.

## 2016-07-12 NOTE — Telephone Encounter (Signed)
Patient is requesting refill on lipitor.  Is requesting 30 day supply to be sent to [facility] on [address].

## 2016-07-12 NOTE — Telephone Encounter (Signed)
ok 

## 2016-07-12 NOTE — Progress Notes (Addendum)
Subjective:   [name] is a 83 y.o. female who presents for Medicare Annual (Subsequent) preventive examination.  The Patient was informed that the wellness visit is to identify future health risk and educate and initiate measures that can reduce risk for increased disease through the lifespan.    Review of Systems:  No ROS.  Medicare Wellness Visit.  Cardiac Risk Factors include: family history of premature cardiovascular disease;dyslipidemia;sedentary lifestyle   Sleep patterns: sleeps 7-8 hours, up to void 2-3 times.  Home Safety/Smoke Alarms: Smoke detectors and security in place.   Living environment; residence and Firearm Safety: Lives with husband in 1 story home, uses rail. Firearms locked away.  Seat Belt Safety/Bike Helmet: Wears seat belt.   Counseling:   Eye Exam-Last exam 2017, every 2 years. [facility] (dr. Tamala Julian) Dental-Last exam >3 years. Only with issues.   Female:   [id number]      Mammo-01/08/2016, negative.       Dexa scan-Last > 10 years. Declines further testing.      CCS-Patient reports diverticulitis. No further testing.       Objective:     Vitals: BP 140/62 (BP Location: Right Arm, Patient Position: Sitting, Cuff Size: Normal)   Pulse 87   Ht '5\' 5"'$  (1.651 m)   Wt 153 lb 0.6 oz (69.4 kg)   SpO2 94% Comment: on portable oxygen  BMI 25.47 kg/m   Body mass index is 25.47 kg/m.   Tobacco History  Smoking Status  . Former Smoker  . Packs/day: 1.00  . Years: 42.00  . Types: Cigarettes  . Quit date: 09/22/2003  Smokeless Tobacco  . Never Used     Counseling given: Not Answered   Past Medical History:  Diagnosis Date  . Allergic rhinitis   . Anxiety   . Arthritis    "back, hands; hips" (12/22/2014)  . Asthma   . Bladder atony   . Chronic bronchitis ([place])   . COPD (chronic obstructive pulmonary disease) ([place])   . Diarrhea   . Emphysema of lung ([place])   . GERD (gastroesophageal reflux disease)    "w/spicey foods" (12/22/2014)    . Hyperlipidemia   . Non-small cell carcinoma of lung, stage 1 ([place]) 2005   1.4 cm Poorly differentiated Squamous cell RUL  T1N0 resected 09/22/2003  . On home oxygen therapy    "3L q night and prn during the day" (12/22/2014)  . Pelvic cyst   . Pneumonia    "this is the 3rd time that I can remember" (12/22/2014)  . Unspecified hypothyroidism    Past Surgical History:  Procedure Laterality Date  . ABDOMINAL HYSTERECTOMY  ~ 1976  . BACK SURGERY    . CATARACT EXTRACTION W/ INTRAOCULAR LENS  IMPLANT, BILATERAL Bilateral ~ 2012  . DILATION AND CURETTAGE OF UTERUS    . LUMBAR DISC SURGERY  1970's  . LYMPH NODE DISSECTION  2005   "all my lymph nodes under breasts, went thru my back; Dr. Arlyce Dice did it"  . Needle aspiration Pelvic cyst  2011   Dr Diona Fanti  . VIDEO ASSISTED THORACOSCOPY (VATS)/THOROCOTOMY Right 09/2003   thoracotomy, right upper lobectomy with node dissection; Dr Demetrios Loll 11/02/2010  . VIDEO BRONCHOSCOPY  03/2004   Archie Endo 11/02/2010   Family History  Problem Relation Age of Onset  . Heart disease Father   . Rheumatologic disease Brother   . Cancer Brother     Leukemia  . Rheumatologic disease Sister    History  Sexual Activity  .  Sexual activity: No    Outpatient Encounter Prescriptions as of 07/12/2016  Medication Sig  . acetaminophen (TYLENOL ARTHRITIS PAIN) 650 MG CR tablet Take 650 mg by mouth daily as needed for pain.   [name] albuterol (PROVENTIL HFA;VENTOLIN HFA) 108 (90 Base) MCG/ACT inhaler Inhale 2 puffs into the lungs every 6 (six) hours as needed for wheezing or shortness of breath.  . ALPRAZolam (XANAX) 1 MG tablet TAKE 1 TABLET BY MOUTH AT BEDTIME  . aspirin EC 81 MG tablet Take 81 mg by mouth 2 (two) times a week.   [name] atorvastatin (LIPITOR) 40 MG tablet Take 1 tablet (40 mg total) by mouth daily. (Patient taking differently: Take 20 mg by mouth every other day. At [facility])  . Cholecalciferol (VITAMIN D) 2000 UNITS tablet Take 1 tablet (2,000 Units total) by  mouth daily.  [name] levalbuterol (XOPENEX) 1.25 MG/3ML nebulizer solution USE 1 VIAL IN NEBULIZER EVERY 8 HOURS AS NEEDED  . levothyroxine (SYNTHROID, LEVOTHROID) 100 MCG tablet Take 1 tablet (100 mcg total) by mouth daily before breakfast.  . Multiple Vitamins-Minerals (CENTRUM SILVER PO) Take 1 tablet by mouth daily.   [name] QVAR 80 MCG/ACT inhaler INHALE 2 PUFFS BY MOUTH INTO THE LUNGS 2 (TWO) TIMES DAILY.  [name] Respiratory Therapy Supplies (FLUTTER) DEVI Blow through 4 times per cycle and repeat 3 cycles per day  . TURMERIC PO Take by mouth.   No facility-administered encounter medications on file as of 07/12/2016.     Activities of Daily Living In your present state of health, do you have any difficulty performing the following activities: 07/12/2016  Hearing? N  Vision? N  Difficulty concentrating or making decisions? N  Walking or climbing stairs? Y  Dressing or bathing? N  Doing errands, shopping? Y  Preparing Food and eating ? N  Using the Toilet? N  In the past six months, have you accidently leaked urine? N  Do you have problems with loss of bowel control? N  Managing your Medications? N  Managing your Finances? N  Housekeeping or managing your Housekeeping? N  Some recent data might be hidden    Patient Care Team: Binnie Rail, MD as PCP - General (Internal Medicine) Deneise Lever, MD as Consulting Physician (Pulmonary Disease) Druscilla Brownie, MD (Dermatology)    Assessment:    Physical assessment deferred to PCP.  Exercise Activities and Dietary recommendations Current Exercise Habits: The patient does not participate in regular exercise at present, Exercise limited by: respiratory conditions(s);orthopedic condition(s) Diet (meal preparation, eat out, water intake, caffeinated beverages, dairy products, fruits and vegetables): Eats at home. Drinks   Breakfast: toast, cereal, almond milk, coffee Lunch: fruit, sandwich, soup, pb crackers Dinner: meat, 2 vegetables.    Patient states she is limited with food options due to diverticulitis and lactose intolerance. Encouraged to make healthy food options when available. Encouraged to stay as active as possible and begin exercise routine provided by physical therapy.   Goals      Patient Stated   . patient states (pt-stated)          Would like to make bones stronger by increasing activity (work out book from Pt), starting 2 times per week.       Fall Risk Fall Risk  07/12/2016 12/02/2015 11/04/2014 09/25/2013 04/10/2013  Falls in the past year? Yes Yes No No No  Number falls in past yr: 2 or more 2 or more - - -  Injury with Fall? - Yes - - -  Risk Factor Category  - High Fall Risk - - -  Risk for fall due to : - Impaired balance/gait;Impaired mobility;History of fall(s) - - -  Follow up Falls prevention discussed - - - -   Depression Screen PHQ 2/9 Scores 07/12/2016 12/02/2015 11/04/2014 09/25/2013  PHQ - 2 Score 0 0 1 0  PHQ- 9 Score - - - -     Cognitive Function       Ad8 score reviewed for issues:  Issues making decisions: no  Less interest in hobbies / activities: no  Repeats questions, stories (family complaining):no  Trouble using ordinary gadgets (microwave, computer, phone):no  Forgets the month or year: no  Mismanaging finances: no  Remembering appts:no  Daily problems with thinking and/or memory:no Ad8 score is=0     Immunization History  Administered Date(s) Administered  . H1N1 05/28/2008  . Influenza Split 03/22/2011, 04/11/2012  . Influenza Whole 03/12/2008, 04/01/2009, 03/16/2010  . Influenza, High Dose Seasonal PF 04/10/2013, 04/30/2014, 04/29/2015, 04/08/2016  . Pneumococcal Conjugate-13 11/04/2014  . Pneumococcal Polysaccharide-23 02/21/2006  . Td 11/17/2009   Screening Tests Health Maintenance  Topic Date Due  . ZOSTAVAX  04/14/1995  . DEXA SCAN  04/13/2000  . TETANUS/TDAP  11/18/2019  . INFLUENZA VACCINE  Completed  . PNA vac Low Risk Adult   Completed      Plan:     Eat heart healthy diet (full of fruits, vegetables, whole grains, lean protein, water--limit salt, fat, and sugar intake) and increase physical activity as tolerated.  Continue doing brain stimulating activities (puzzles, reading, adult coloring books, staying active) to keep memory sharp.   Bring a copy of your advance directives to your next office visit.   During the course of the visit the patient was educated and counseled about the following appropriate screening and preventive services:   Vaccines to include Pneumoccal, Influenza, Hepatitis B, Td, Zostavax, HCV  Cardiovascular Disease  Colorectal cancer screening  Bone density screening  Diabetes screening  Glaucoma screening  Mammography/PAP  Nutrition counseling   Patient Instructions (the written plan) was given to the patient.   Gerilyn Nestle, RN  07/12/2016   Medical screening examination/treatment/procedure(s) were performed by non-physician practitioner and as supervising physician I was immediately available for consultation/collaboration. I agree with above. Binnie Rail, MD

## 2016-07-12 NOTE — Progress Notes (Signed)
Pre visit review using our clinic review tool, if applicable. No additional management support is needed unless otherwise documented below in the visit note. 

## 2016-07-12 NOTE — Patient Instructions (Addendum)
Eat heart healthy diet (full of fruits, vegetables, whole grains, lean protein, water--limit salt, fat, and sugar intake) and increase physical activity as tolerated.  Continue doing brain stimulating activities (puzzles, reading, adult coloring books, staying active) to keep memory sharp.   Bring a copy of your advance directives to your next office visit.  Fall Prevention in the Home Introduction Falls can cause injuries. They can happen to people of all ages. There are many things you can do to make your home safe and to help prevent falls. What can I do on the outside of my home?  Regularly fix the edges of walkways and driveways and fix any cracks.  Remove anything that might make you trip as you walk through a door, such as a raised step or threshold.  Trim any bushes or trees on the path to your home.  Use bright outdoor lighting.  Clear any walking paths of anything that might make someone trip, such as rocks or tools.  Regularly check to see if handrails are loose or broken. Make sure that both sides of any steps have handrails.  Any raised decks and porches should have guardrails on the edges.  Have any leaves, snow, or ice cleared regularly.  Use sand or salt on walking paths during winter.  Clean up any spills in your garage right away. This includes oil or grease spills. What can I do in the bathroom?  Use night lights.  Install grab bars by the toilet and in the tub and shower. Do not use towel bars as grab bars.  Use non-skid mats or decals in the tub or shower.  If you need to sit down in the shower, use a plastic, non-slip stool.  Keep the floor dry. Clean up any water that spills on the floor as soon as it happens.  Remove soap buildup in the tub or shower regularly.  Attach bath mats securely with double-sided non-slip rug tape.  Do not have throw rugs and other things on the floor that can make you trip. What can I do in the bedroom?  Use night  lights.  Make sure that you have a light by your bed that is easy to reach.  Do not use any sheets or blankets that are too big for your bed. They should not hang down onto the floor.  Have a firm chair that has side arms. You can use this for support while you get dressed.  Do not have throw rugs and other things on the floor that can make you trip. What can I do in the kitchen?  Clean up any spills right away.  Avoid walking on wet floors.  Keep items that you use a lot in easy-to-reach places.  If you need to reach something above you, use a strong step stool that has a grab bar.  Keep electrical cords out of the way.  Do not use floor polish or wax that makes floors slippery. If you must use wax, use non-skid floor wax.  Do not have throw rugs and other things on the floor that can make you trip. What can I do with my stairs?  Do not leave any items on the stairs.  Make sure that there are handrails on both sides of the stairs and use them. Fix handrails that are broken or loose. Make sure that handrails are as long as the stairways.  Check any carpeting to make sure that it is firmly attached to the stairs. Fix any   carpet that is loose or worn.  Avoid having throw rugs at the top or bottom of the stairs. If you do have throw rugs, attach them to the floor with carpet tape.  Make sure that you have a light switch at the top of the stairs and the bottom of the stairs. If you do not have them, ask someone to add them for you. What else can I do to help prevent falls?  Wear shoes that:  Do not have high heels.  Have rubber bottoms.  Are comfortable and fit you well.  Are closed at the toe. Do not wear sandals.  If you use a stepladder:  Make sure that it is fully opened. Do not climb a closed stepladder.  Make sure that both sides of the stepladder are locked into place.  Ask someone to hold it for you, if possible.  Clearly mark and make sure that you can  see:  Any grab bars or handrails.  First and last steps.  Where the edge of each step is.  Use tools that help you move around (mobility aids) if they are needed. These include:  Canes.  Walkers.  Scooters.  Crutches.  Turn on the lights when you go into a dark area. Replace any light bulbs as soon as they burn out.  Set up your furniture so you have a clear path. Avoid moving your furniture around.  If any of your floors are uneven, fix them.  If there are any pets around you, be aware of where they are.  Review your medicines with your doctor. Some medicines can make you feel dizzy. This can increase your chance of falling. Ask your doctor what other things that you can do to help prevent falls. This information is not intended to replace advice given to you by your health care provider. Make sure you discuss any questions you have with your health care provider. Document Released: 04/02/2009 Document Revised: 11/12/2015 Document Reviewed: 07/11/2014  2017 [employer]  Health Maintenance, Female Introduction Adopting a healthy lifestyle and getting preventive care can go a long way to promote health and wellness. Talk with your health care provider about what schedule of regular examinations is right for you. This is a good chance for you to check in with your provider about disease prevention and staying healthy. In between checkups, there are plenty of things you can do on your own. Experts have done a lot of research about which lifestyle changes and preventive measures are most likely to keep you healthy. Ask your health care provider for more information. Weight and diet Eat a healthy diet  Be sure to include plenty of vegetables, fruits, low-fat dairy products, and lean protein.  Do not eat a lot of foods high in solid fats, added sugars, or salt.  Get regular exercise. This is one of the most important things you can do for your health.  Most adults should exercise  for at least 150 minutes each week. The exercise should increase your heart rate and make you sweat (moderate-intensity exercise).  Most adults should also do strengthening exercises at least twice a week. This is in addition to the moderate-intensity exercise. Maintain a healthy weight  Body mass index (BMI) is a measurement that can be used to identify possible weight problems. It estimates body fat based on height and weight. Your health care provider can help determine your BMI and help you achieve or maintain a healthy weight.  For females 20 years of  age and older:  A BMI below 18.5 is considered underweight.  A BMI of 18.5 to 24.9 is normal.  A BMI of 25 to 29.9 is considered overweight.  A BMI of 30 and above is considered obese. Watch levels of cholesterol and blood lipids  You should start having your blood tested for lipids and cholesterol at 80 years of age, then have this test every 5 years.  You may need to have your cholesterol levels checked more often if:  Your lipid or cholesterol levels are high.  You are older than 81 years of age.  You are at high risk for heart disease. Cancer screening Lung Cancer  Lung cancer screening is recommended for adults 60-45 years old who are at high risk for lung cancer because of a history of smoking.  A yearly low-dose CT scan of the lungs is recommended for people who:  Currently smoke.  Have quit within the past 15 years.  Have at least a 30-pack-year history of smoking. A pack year is smoking an average of one pack of cigarettes a day for 1 year.  Yearly screening should continue until it has been 15 years since you quit.  Yearly screening should stop if you develop a health problem that would prevent you from having lung cancer treatment. Breast Cancer  Practice breast self-awareness. This means understanding how your breasts normally appear and feel.  It also means doing regular breast self-exams. Let your health  care provider know about any changes, no matter how small.  If you are in your 20s or 30s, you should have a clinical breast exam (CBE) by a health care provider every 1-3 years as part of a regular health exam.  If you are 8 or older, have a CBE every year. Also consider having a breast X-ray (mammogram) every year.  If you have a family history of breast cancer, talk to your health care provider about genetic screening.  If you are at high risk for breast cancer, talk to your health care provider about having an MRI and a mammogram every year.  Breast cancer gene (BRCA) assessment is recommended for women who have family members with BRCA-related cancers. BRCA-related cancers include:  Breast.  Ovarian.  Tubal.  Peritoneal cancers.  Results of the assessment will determine the need for genetic counseling and BRCA1 and BRCA2 testing. Cervical Cancer  Your health care provider may recommend that you be screened regularly for cancer of the pelvic organs (ovaries, uterus, and vagina). This screening involves a pelvic examination, including checking for microscopic changes to the surface of your cervix (Pap test). You may be encouraged to have this screening done every 3 years, beginning at age 49.  For women ages 22-65, health care providers may recommend pelvic exams and Pap testing every 3 years, or they may recommend the Pap and pelvic exam, combined with testing for human papilloma virus (HPV), every 5 years. Some types of HPV increase your risk of cervical cancer. Testing for HPV may also be done on women of any age with unclear Pap test results.  Other health care providers may not recommend any screening for nonpregnant women who are considered low risk for pelvic cancer and who do not have symptoms. Ask your health care provider if a screening pelvic exam is right for you.  If you have had past treatment for cervical cancer or a condition that could lead to cancer, you need Pap  tests and screening for cancer for at least  20 years after your treatment. If Pap tests have been discontinued, your risk factors (such as having a new sexual partner) need to be reassessed to determine if screening should resume. Some women have medical problems that increase the chance of getting cervical cancer. In these cases, your health care provider may recommend more frequent screening and Pap tests. Colorectal Cancer  This type of cancer can be detected and often prevented.  Routine colorectal cancer screening usually begins at 81 years of age and continues through 81 years of age.  Your health care provider may recommend screening at an earlier age if you have risk factors for colon cancer.  Your health care provider may also recommend using home test kits to check for hidden blood in the stool.  A small camera at the end of a tube can be used to examine your colon directly (sigmoidoscopy or colonoscopy). This is done to check for the earliest forms of colorectal cancer.  Routine screening usually begins at age 19.  Direct examination of the colon should be repeated every 5-10 years through 81 years of age. However, you may need to be screened more often if early forms of precancerous polyps or small growths are found. Skin Cancer  Check your skin from head to toe regularly.  Tell your health care provider about any new moles or changes in moles, especially if there is a change in a mole's shape or color.  Also tell your health care provider if you have a mole that is larger than the size of a pencil eraser.  Always use sunscreen. Apply sunscreen liberally and repeatedly throughout the day.  Protect yourself by wearing long sleeves, pants, a wide-brimmed hat, and sunglasses whenever you are outside. Heart disease, diabetes, and high blood pressure  High blood pressure causes heart disease and increases the risk of stroke. High blood pressure is more likely to develop  in:  People who have blood pressure in the high end of the normal range (130-139/85-89 mm Hg).  People who are overweight or obese.  People who are African American.  If you are 1-61 years of age, have your blood pressure checked every 3-5 years. If you are 44 years of age or older, have your blood pressure checked every year. You should have your blood pressure measured twice-once when you are at a hospital or clinic, and once when you are not at a hospital or clinic. Record the average of the two measurements. To check your blood pressure when you are not at a hospital or clinic, you can use:  An automated blood pressure machine at a pharmacy.  A home blood pressure monitor.  If you are between 58 years and 39 years old, ask your health care provider if you should take aspirin to prevent strokes.  Have regular diabetes screenings. This involves taking a blood sample to check your fasting blood sugar level.  If you are at a normal weight and have a low risk for diabetes, have this test once every three years after 81 years of age.  If you are overweight and have a high risk for diabetes, consider being tested at a younger age or more often. Preventing infection Hepatitis B  If you have a higher risk for hepatitis B, you should be screened for this virus. You are considered at high risk for hepatitis B if:  You were born in a country where hepatitis B is common. Ask your health care provider which countries are considered high risk.  Your parents were born in a high-risk country, and you have not been immunized against hepatitis B (hepatitis B vaccine).  You have HIV or AIDS.  You use needles to inject street drugs.  You live with someone who has hepatitis B.  You have had sex with someone who has hepatitis B.  You get hemodialysis treatment.  You take certain medicines for conditions, including cancer, organ transplantation, and autoimmune conditions. Hepatitis C  Blood  testing is recommended for:  Everyone born from 1945 through 1965.  Anyone with known risk factors for hepatitis C. Sexually transmitted infections (STIs)  You should be screened for sexually transmitted infections (STIs) including gonorrhea and chlamydia if:  You are sexually active and are younger than 81 years of age.  You are older than 81 years of age and your health care provider tells you that you are at risk for this type of infection.  Your sexual activity has changed since you were last screened and you are at an increased risk for chlamydia or gonorrhea. Ask your health care provider if you are at risk.  If you do not have HIV, but are at risk, it may be recommended that you take a prescription medicine daily to prevent HIV infection. This is called pre-exposure prophylaxis (PrEP). You are considered at risk if:  You are sexually active and do not regularly use condoms or know the HIV status of your partner(s).  You take drugs by injection.  You are sexually active with a partner who has HIV. Talk with your health care provider about whether you are at high risk of being infected with HIV. If you choose to begin PrEP, you should first be tested for HIV. You should then be tested every 3 months for as long as you are taking PrEP. Pregnancy  If you are premenopausal and you may become pregnant, ask your health care provider about preconception counseling.  If you may become pregnant, take 400 to 800 micrograms (mcg) of folic acid every day.  If you want to prevent pregnancy, talk to your health care provider about birth control (contraception). Osteoporosis and menopause  Osteoporosis is a disease in which the bones lose minerals and strength with aging. This can result in serious bone fractures. Your risk for osteoporosis can be identified using a bone density scan.  If you are 65 years of age or older, or if you are at risk for osteoporosis and fractures, ask your health  care provider if you should be screened.  Ask your health care provider whether you should take a calcium or vitamin D supplement to lower your risk for osteoporosis.  Menopause may have certain physical symptoms and risks.  Hormone replacement therapy may reduce some of these symptoms and risks. Talk to your health care provider about whether hormone replacement therapy is right for you. Follow these instructions at home:  Schedule regular health, dental, and eye exams.  Stay current with your immunizations.  Do not use any tobacco products including cigarettes, chewing tobacco, or electronic cigarettes.  If you are pregnant, do not drink alcohol.  If you are breastfeeding, limit how much and how often you drink alcohol.  Limit alcohol intake to no more than 1 drink per day for nonpregnant women. One drink equals 12 ounces of beer, 5 ounces of wine, or 1 ounces of hard liquor.  Do not use street drugs.  Do not share needles.  Ask your health care provider for help if you need support or   information about quitting drugs.  Tell your health care provider if you often feel depressed.  Tell your health care provider if you have ever been abused or do not feel safe at home. This information is not intended to replace advice given to you by your health care provider. Make sure you discuss any questions you have with your health care provider. Document Released: 12/20/2010 Document Revised: 11/12/2015 Document Reviewed: 03/10/2015  2017 [employer]

## 2016-07-13 MED ORDER — ATORVASTATIN CALCIUM 20 MG PO TABS: 20.0000 mg | ORAL_TABLET | Freq: Every day | ORAL | 1 refills | Status: DC

## 2016-07-13 NOTE — Telephone Encounter (Signed)
Spoke with pt, agreed to have 20 mg tablet sent in. RX sent to [facility]

## 2016-07-13 NOTE — Telephone Encounter (Signed)
Please advise, is Pt taking 20 mg or 40 mg?

## 2016-07-16 IMAGING — CR DG CHEST 2V
2 series · 2 of 2 positions shown · non-contrast
Comparison: 06/30/2014 and 10/14/2014

CLINICAL DATA: Coughing up blood last night. Fever. Generalized
chest pain with shortness of breath. History of emphysema.

EXAM:
CHEST  2 VIEW

[chest pa]
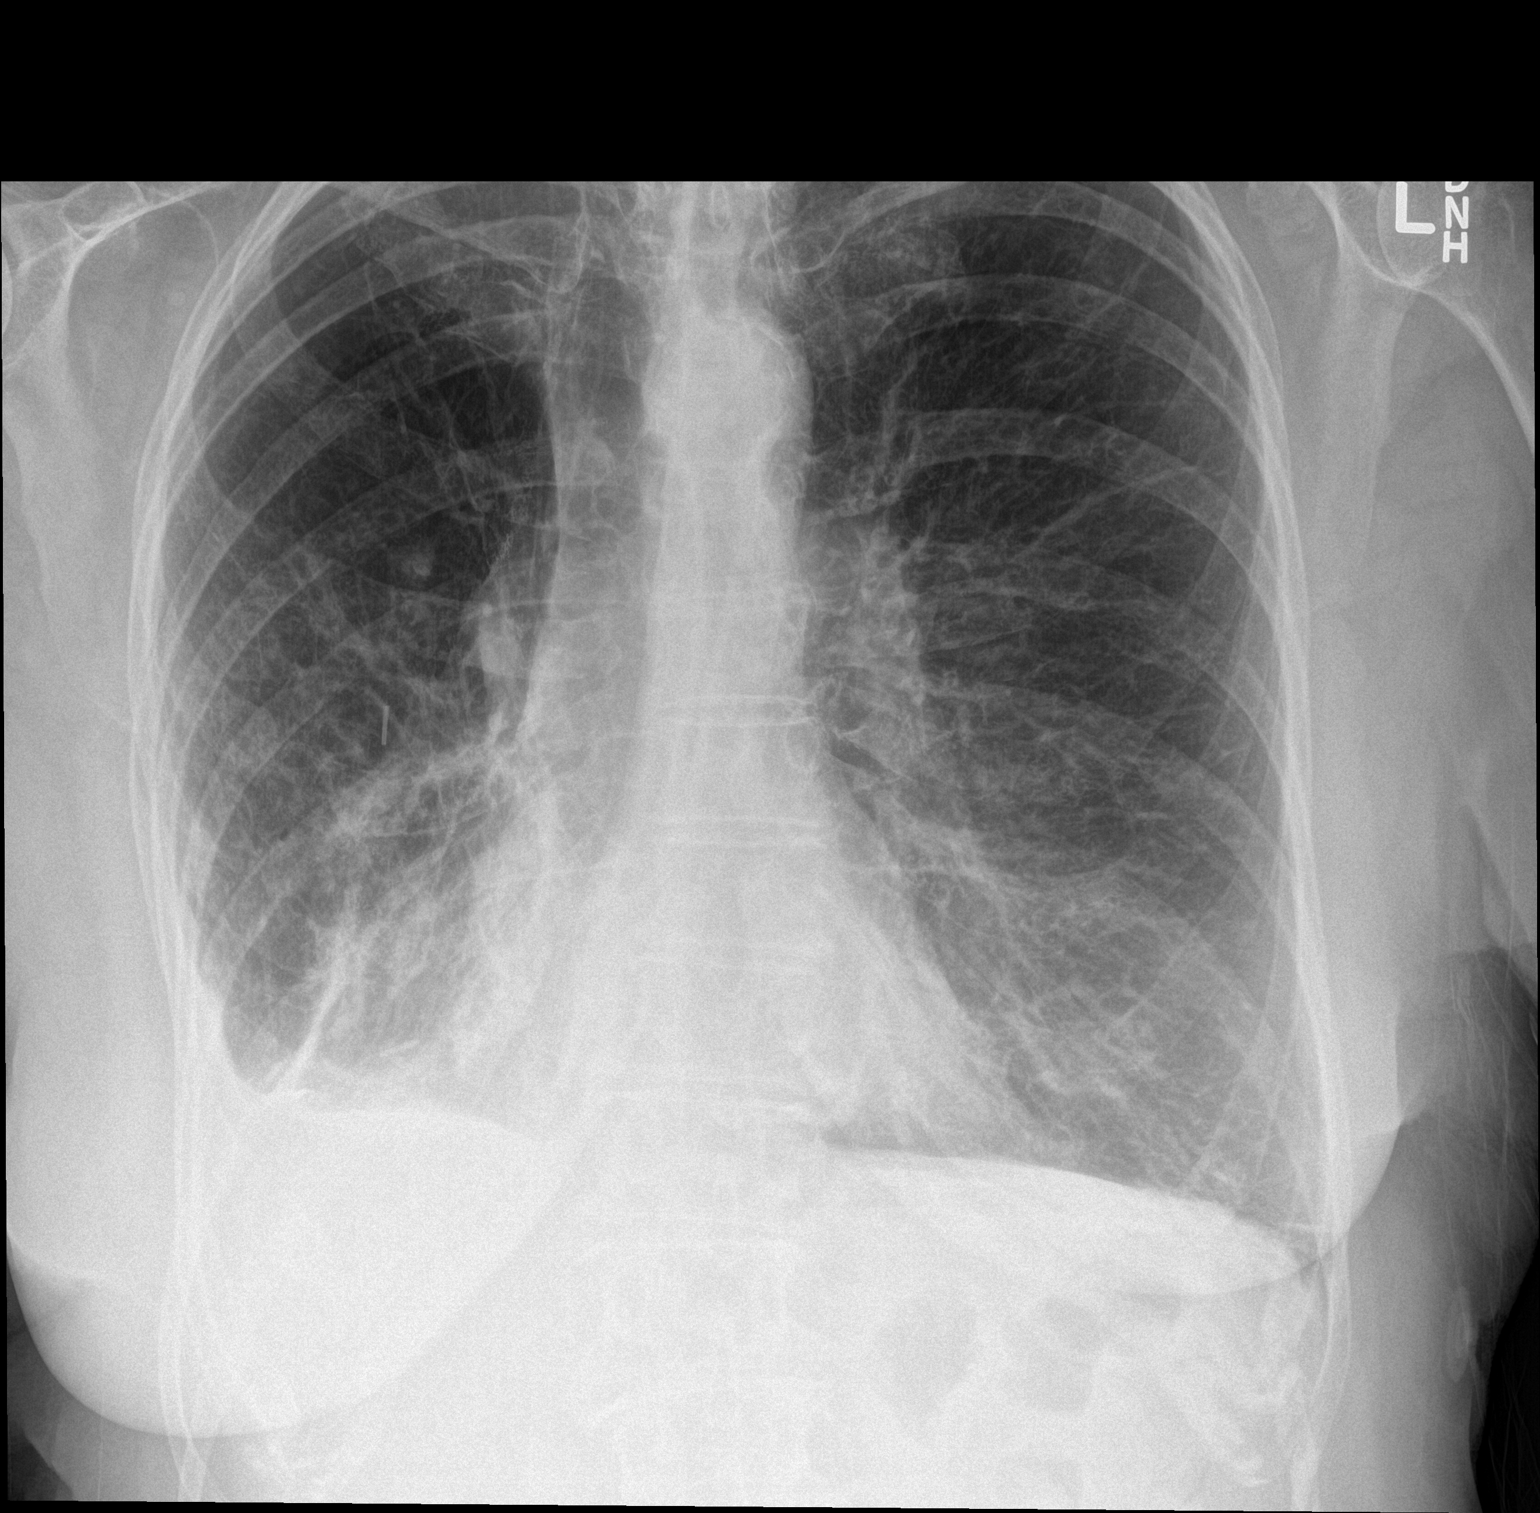

[chest lat]
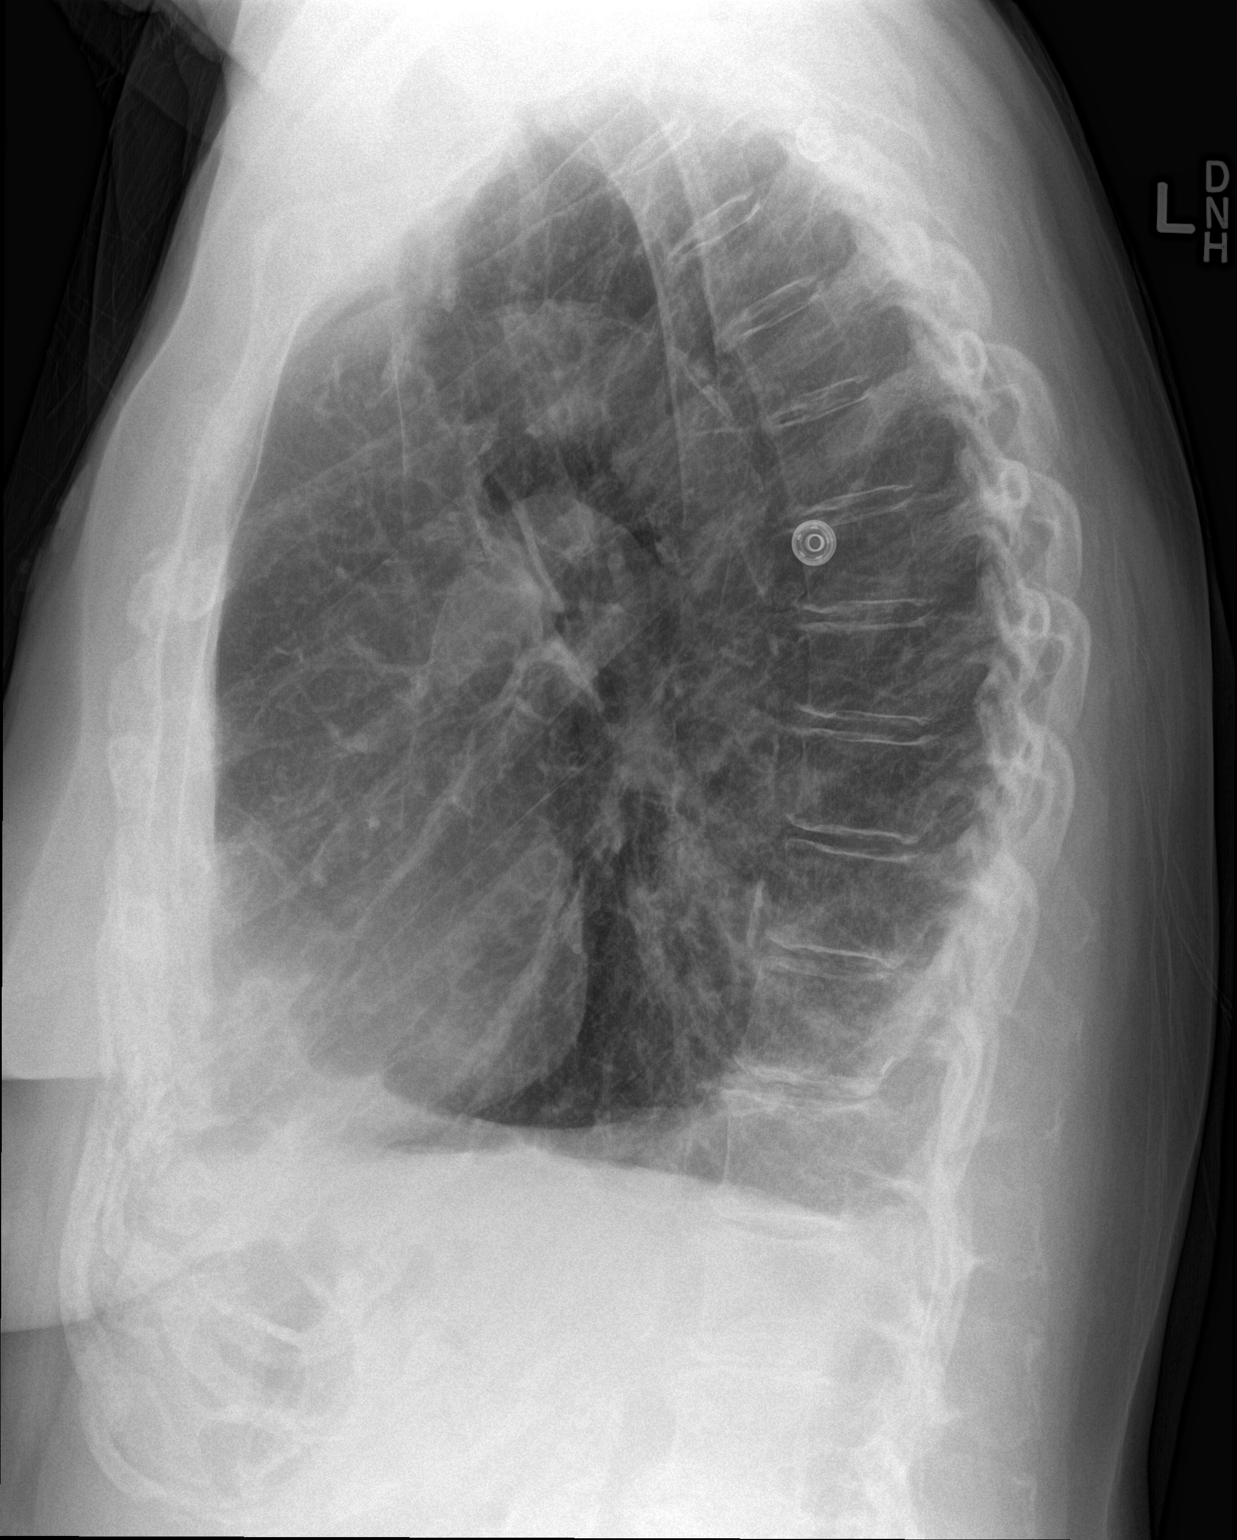

[2 of 2 positions shown; findings below may reference images not displayed]

FINDINGS: There is chronic volume loss at the right lung base with pleural
thickening and known pleural calcifications. There are increased
densities in the right lower lung which are concerning for an acute
infectious or inflammatory process. There is a stable linear scar at
the right lung base. Again noted is a calcified nodule in the right
perihilar region. Lucency in the upper lungs are compatible with
emphysema. No significant airspace disease in the left lung.
Atherosclerotic calcifications involving the aortic arch.
IMPRESSION: Acute on chronic disease at the right lung base. Findings concerning
for an acute infectious or inflammatory process in the right lower
lung.

## 2016-07-17 DIAGNOSIS — J9611 Chronic respiratory failure with hypoxia: Secondary | ICD-10-CM | POA: Diagnosis not present

## 2016-07-17 DIAGNOSIS — J449 Chronic obstructive pulmonary disease, unspecified: Secondary | ICD-10-CM | POA: Diagnosis not present

## 2016-07-20 ENCOUNTER — Other Ambulatory Visit: Payer: Self-pay | Admitting: Internal Medicine

## 2016-07-21 NOTE — Telephone Encounter (Signed)
RX faxed to [facility] 

## 2016-07-31 ENCOUNTER — Other Ambulatory Visit: Payer: Self-pay | Admitting: Internal Medicine

## 2016-08-05 ENCOUNTER — Ambulatory Visit: Payer: PPO | Admitting: Family Medicine

## 2016-08-11 ENCOUNTER — Ambulatory Visit (INDEPENDENT_AMBULATORY_CARE_PROVIDER_SITE_OTHER)
Admission: RE | Admit: 2016-08-11 | Discharge: 2016-08-11 | Disposition: A | Discharge status: Home / Self Care | Payer: PPO | Source: Ambulatory Visit | Attending: Internal Medicine | Admitting: Internal Medicine

## 2016-08-11 ENCOUNTER — Encounter: Payer: Self-pay | Admitting: Internal Medicine

## 2016-08-11 ENCOUNTER — Ambulatory Visit (INDEPENDENT_AMBULATORY_CARE_PROVIDER_SITE_OTHER): Payer: PPO | Admitting: Internal Medicine

## 2016-08-11 VITALS — BP 112/60 | HR 90 | Ht 66.0 in | Wt 149.4 lb

## 2016-08-11 DIAGNOSIS — J9611 Chronic respiratory failure with hypoxia: Secondary | ICD-10-CM | POA: Diagnosis not present

## 2016-08-11 DIAGNOSIS — R0602 Shortness of breath: Secondary | ICD-10-CM | POA: Diagnosis not present

## 2016-08-11 DIAGNOSIS — J449 Chronic obstructive pulmonary disease, unspecified: Secondary | ICD-10-CM | POA: Diagnosis not present

## 2016-08-11 DIAGNOSIS — J441 Chronic obstructive pulmonary disease with (acute) exacerbation: Secondary | ICD-10-CM

## 2016-08-11 DIAGNOSIS — R05 Cough: Secondary | ICD-10-CM | POA: Diagnosis not present

## 2016-08-11 DIAGNOSIS — C3491 Malignant neoplasm of unspecified part of right bronchus or lung: Secondary | ICD-10-CM

## 2016-08-11 MED ORDER — FLUTICASONE FUROATE 100 MCG/ACT IN AEPB: 1.0000 | INHALATION_SPRAY | Freq: Every day | RESPIRATORY_TRACT | 12 refills | Status: DC

## 2016-08-11 MED ORDER — FLUTICASONE FUROATE 200 MCG/ACT IN AEPB: 1.0000 | INHALATION_SPRAY | Freq: Every day | RESPIRATORY_TRACT | 0 refills | Status: DC

## 2016-08-11 MED ORDER — LEVALBUTEROL HCL 1.25 MG/3ML IN NEBU: INHALATION_SOLUTION | RESPIRATORY_TRACT | 3 refills | Status: DC

## 2016-08-11 NOTE — Patient Instructions (Addendum)
Sample and Script for Arnuity  100  Inhaler    Inhale 1 puff, then rinse mouth, once daily       Try this instead of Qvar. See if your insurance will cover it cheaper than Qvar.  Order- CXR - dx exacerbation COPD mixed type   Script for levalbuterol/ xopenex neb solution    We will see if we can get this through Medicare from a DME company.  Please call as needed

## 2016-08-11 NOTE — Progress Notes (Signed)
Patient ID: [name], female    DOB: [birth year], 83 y.o.   MRN: [id number]  HPI female former smoker followed for history of right upper lobe resection/NSSCA, COPD, chronic hypoxic respiratory failure, allergic rhinitis, complicated by glaucoma Oximetry walk test 07/06/2015-desaturation to 85% while walking on room air. Saturation sustained at 97% on 2 L prongs  -----------------------------------------------------------------------------    8/43//6389-83 year old female former smoker followed for history of right upper lobe resection/NSSCA, COPD, chronic hypoxic respiratory failure, allergic rhinitis, complicated by glaucoma O2 2 L/[facility] Distinct flare of COPD/bronchitis in June after irritant odor exposure-resolved. Humid summer weather bothering her. Cough productive clear/white. Using nebulizer more. Qvar definitely helps. Husband pushes her in wheelchair if going any distance.  08/11/2016- 83 year old female former smoker followed for history of right upper lobe resection/NSSCA, COPD, chronic hypoxic respiratory failure, allergic rhinitis, complicated by glaucoma O2 3 L/[facility] FOLLOWS FOR:Pt continues to wear O2 QHS and exertion. Pt gives out quickly-unable to do many chores due to SOB. Husband with her doesn't indicate a dramatic change. She implies exertional dyspnea is probably near her baseline. Does note increased cough and rattle in the last few days with clear/white sputum, no fever or blood. Xopenex nebulizer helps when needed but makes jittery.  ROS-see HPI   Negative unless "+" Constitutional:    weight loss, night sweats, fevers, chills, fatigue, lassitude. HEENT:    headaches, difficulty swallowing, tooth/dental problems, sore throat,       sneezing, itching, ear ache, nasal congestion, post nasal drip, snoring CV:     chest pain, orthopnea, PND, swelling in lower extremities, anasarca,                                                        dizziness,  palpitations Resp:   + shortness of breath with exertion or at rest.               + productive cough,   non-productive cough, coughing up of blood.              change in color of mucus.  wheezing.   Skin:    rash or lesions. GI:  No-   heartburn, indigestion, abdominal pain, nausea, vomiting,  GU:  MS:   joint pain, stiffness, . Neuro-     nothing unusual Psych:  change in mood or affect.  depression or anxiety.   memory loss.   Objective:   OBJ- Physical Exam General- Alert, Oriented, Affect-appropriate, Distress- none acute, + Wearing O2 3 L pulse                 saturation 94% room air at rest (left oxygen in waiting room) Skin- rash-none, lesions- none, excoriation- none Lymphadenopathy- none Head- atraumatic            Eyes- Gross vision intact, PERRLA, conjunctivae and secretions clear            Ears- Hearing, canals-normal            Nose- Clear, no-Septal dev, mucus, polyps, erosion, perforation             Throat- Mallampati II , mucosa clear , drainage- none, tonsils- atrophic Neck- flexible , trachea midline, no stridor , thyroid nl, carotid no bruit Chest - symmetrical excursion , unlabored  Heart/CV- RRR , no murmur , no gallop  , no rub, nl s1 s2                           - JVD- none , edema- none, stasis changes- none, varices- none           Lung-  + Loose rhonchi left scapula/unlabored, cough + , dullness-none, rub- none           Chest wall- + remote right thoracotomy for right  lobectomy Abd-  Br/ Gen/ Rectal- Not done, not indicated Extrem- cyanosis- none, clubbing, none, atrophy- none, strength- nl Neuro- grossly intact to observation

## 2016-08-12 NOTE — Assessment & Plan Note (Signed)
No recurrence of lung cancer so far. Focal rhonchi on exam at this visit may be minor retained secretions or early bronchitis exacerbation. Plan-CXR

## 2016-08-12 NOTE — Assessment & Plan Note (Signed)
She now uses O2 3 L to be comfortable with a pulse regulator device. We discussed options

## 2016-08-12 NOTE — Assessment & Plan Note (Addendum)
May have a mild acute bronchitis with recent increased cough, nonpurulent. Plan-we will try to get her nebulizer solution through a DME/Medicare if we can-depends on coverage for Xopenex. Qvar device and coverage are changing so we will let her try sample Arnuity.

## 2016-08-15 ENCOUNTER — Telehealth: Payer: Self-pay | Admitting: Internal Medicine

## 2016-08-15 NOTE — Telephone Encounter (Signed)
Spoke with pt. States that since starting Arunity her cough is resolving. Reports that she feels that this new inhaler is helping her better than Qvar. Advised her that this was great to hear and let us know if anything changes. Nothing further was needed.

## 2016-08-16 ENCOUNTER — Telehealth: Payer: Self-pay | Admitting: Internal Medicine

## 2016-08-16 MED ORDER — LEVALBUTEROL HCL 1.25 MG/3ML IN NEBU: 1.2500 mg | INHALATION_SOLUTION | Freq: Three times a day (TID) | RESPIRATORY_TRACT | 2 refills | Status: DC | PRN

## 2016-08-16 NOTE — Telephone Encounter (Signed)
Please explain to patient- We had suggested trying to see if we could get her nebulizer medication through a DME so it would be covered by Medicare. Unfortunately they can't provide xopenex neb solution, which we had chosen because it tends to stimulate the heart less. She can choose to continue to self-pay for xopenex/ levalbuterol at the drug store for her nebulizer. Or we can change her neb solution to Duo-Neb, with ipratropium and albuterol and get that through [facility].

## 2016-08-16 NOTE — Telephone Encounter (Signed)
Spoke with Leafy Ro, states that Xopenex is not able to be prescribed by [facility] >> an alternative for the Xopenex is Brovana other wise her Rx will have to go through her local pharmacy. Please advise Dr Annamaria Boots. Thanks.   Allergies as of 08/16/2016      Reactions   Daliresp [roflumilast] Nausea Only   Tiotropium Bromide Monohydrate    Urinary retention   Ampicillin Diarrhea, Nausea And Vomiting   GI upset   Azithromycin Diarrhea, Nausea And Vomiting   Codeine Nausea And Vomiting   Makes her "crazy"   Fludrocortisone Acetate Itching   Lactose Intolerance (gi) Other (See Comments)   Gas and bloating   Milk-related Compounds Other (See Comments)   Gas and bloating   Nitrofurantoin Diarrhea, Nausea And Vomiting   Upset GI   Other Other (See Comments)   No nuts and seeds because of diverticulitis   Penicillins Nausea And Vomiting   Has patient had a PCN reaction causing immediate rash, facial/tongue/throat swelling, SOB or lightheadedness with hypotension: Yes Has patient had a PCN reaction causing severe rash involving mucus membranes or skin necrosis: No Has patient had a PCN reaction that required hospitalization No Has patient had a PCN reaction occurring within the last 10 years: No No "cillins" If all of the above answers are "NO", then may proceed with Cephalosporin use.   Prednisone Other (See Comments)   Insomnia after 2 days   Sulfonamide Derivatives Diarrhea, Nausea And Vomiting      Medication List       Accurate as of 08/16/16 10:51 AM. Always use your most recent med list.          albuterol 108 (90 Base) MCG/ACT inhaler Commonly known as:  PROVENTIL HFA;VENTOLIN HFA Inhale 2 puffs into the lungs every 6 (six) hours as needed for wheezing or shortness of breath.   ALPRAZolam 1 MG tablet Commonly known as:  XANAX TAKE 1 TABLET BY MOUTH AT BEDTIME   aspirin EC 81 MG tablet Take 81 mg by mouth 2 (two) times a week.   atorvastatin 20 MG tablet Commonly known as:   LIPITOR Take 1 tablet (20 mg total) by mouth daily.   CENTRUM SILVER PO Take 1 tablet by mouth daily.   Fluticasone Furoate 100 MCG/ACT Aepb Commonly known as:  ARNUITY ELLIPTA Inhale 1 puff into the lungs daily. Rinse mouth   Fluticasone Furoate 200 MCG/ACT Aepb Commonly known as:  ARNUITY ELLIPTA Inhale 1 puff into the lungs daily.   FLUTTER Devi Blow through 4 times per cycle and repeat 3 cycles per day   levalbuterol 1.25 MG/3ML nebulizer solution Commonly known as:  XOPENEX USE 1 VIAL IN NEBULIZER EVERY 8 HOURS AS NEEDED   levothyroxine 100 MCG tablet Commonly known as:  SYNTHROID, LEVOTHROID TAKE 1 TABLET (100 MCG TOTAL) BY MOUTH DAILY BEFORE BREAKFAST.   QVAR 80 MCG/ACT inhaler Generic drug:  beclomethasone INHALE 2 PUFFS BY MOUTH INTO THE LUNGS 2 (TWO) TIMES DAILY.   TURMERIC PO Take by mouth.   TYLENOL ARTHRITIS PAIN 650 MG CR tablet Generic drug:  acetaminophen Take 650 mg by mouth daily as needed for pain.   Vitamin D 2000 units tablet Take 1 tablet (2,000 Units total) by mouth daily.

## 2016-08-16 NOTE — Telephone Encounter (Signed)
Spoke with pt. She would like to get this prescription from her local pharmacy. Rx has been sent in. Nothing further was needed.

## 2016-08-17 DIAGNOSIS — J449 Chronic obstructive pulmonary disease, unspecified: Secondary | ICD-10-CM | POA: Diagnosis not present

## 2016-08-17 DIAGNOSIS — J9611 Chronic respiratory failure with hypoxia: Secondary | ICD-10-CM | POA: Diagnosis not present

## 2016-08-22 ENCOUNTER — Telehealth: Payer: Self-pay | Admitting: Internal Medicine

## 2016-08-22 MED ORDER — UMECLIDINIUM BROMIDE 62.5 MCG/INH IN AEPB: 1.0000 | INHALATION_SPRAY | Freq: Every day | RESPIRATORY_TRACT | 12 refills | Status: DC

## 2016-08-22 NOTE — Telephone Encounter (Signed)
Pt is aware CY had changed QVAR to Incruse inhaler since Insurance will no longer cover. Pt states she has been on Arnuity and would like to know which CY would like to have her on.   Pt states she is having a Strachy throat after using Arnuity.  CY Please advise.

## 2016-08-22 NOTE — Telephone Encounter (Signed)
I'm not sure that a steroid inhaler like Qvar or Arnuity is helpful for her. Suggest that instead of either, she now try the Incruse bronchodilator inhaler.

## 2016-08-22 NOTE — Telephone Encounter (Signed)
Per CY-let's change QVAR Redihaler to Incruse #1 1 puff QD with 12 refills. Pharmacy will instruct patient on how to use inhaler.

## 2016-08-23 NOTE — Telephone Encounter (Signed)
Spoke with pt. She is aware of CY's response. Rx for Incruse has already been sent in. Nothing further was needed.

## 2016-08-31 ENCOUNTER — Telehealth: Payer: Self-pay | Admitting: Internal Medicine

## 2016-08-31 NOTE — Telephone Encounter (Signed)
She may do better with Arnuity 100, which is chemically closer to Qvar, but should be covered by insurance We may have a sample.  Rx is Arnuity 100, inhale 1 puff, then rinse mouth, once daily.  Ref x 12.   Dc Incruse

## 2016-08-31 NOTE — Telephone Encounter (Signed)
Spoke with pt, states she is not doing well on Incruse- c/o nausea after using medication, loss of appetite, worsening sob, mucus production(white mucus).  Pt has been having to use Xopenex nebulizer more frequently since switching from Qvar to Incruse.  Pt requesting further recs- notes she has a few doses of Qvar left at home.   [facility] [address]   CY please advise on recs.  Thanks.   Allergies  Allergen Reactions  . Daliresp [Roflumilast] Nausea Only  . Tiotropium Bromide Monohydrate     Urinary retention  . Ampicillin Diarrhea and Nausea And Vomiting    GI upset  . Azithromycin Diarrhea and Nausea And Vomiting  . Codeine Nausea And Vomiting    Makes her "crazy"  . Fludrocortisone Acetate Itching  . Lactose Intolerance (Gi) Other (See Comments)    Gas and bloating   . Milk-Related Compounds Other (See Comments)    Gas and bloating  . Nitrofurantoin Diarrhea and Nausea And Vomiting    Upset GI  . Other Other (See Comments)    No nuts and seeds because of diverticulitis  . Penicillins Nausea And Vomiting    Has patient had a PCN reaction causing immediate rash, facial/tongue/throat swelling, SOB or lightheadedness with hypotension: Yes Has patient had a PCN reaction causing severe rash involving mucus membranes or skin necrosis: No Has patient had a PCN reaction that required hospitalization No Has patient had a PCN reaction occurring within the last 10 years: No No "cillins" If all of the above answers are "NO", then may proceed with Cephalosporin use.   . Prednisone Other (See Comments)    Insomnia after 2 days  . Sulfonamide Derivatives Diarrhea and Nausea And Vomiting   Current Outpatient Prescriptions on File Prior to Visit  Medication Sig Dispense Refill  . acetaminophen (TYLENOL ARTHRITIS PAIN) 650 MG CR tablet Take 650 mg by mouth daily as needed for pain.     [name] albuterol (PROVENTIL HFA;VENTOLIN HFA) 108 (90 Base) MCG/ACT inhaler Inhale 2 puffs into the  lungs every 6 (six) hours as needed for wheezing or shortness of breath. 1 Inhaler 12  . ALPRAZolam (XANAX) 1 MG tablet TAKE 1 TABLET BY MOUTH AT BEDTIME 30 tablet 2  . aspirin EC 81 MG tablet Take 81 mg by mouth 2 (two) times a week.     [name] atorvastatin (LIPITOR) 20 MG tablet Take 1 tablet (20 mg total) by mouth daily. 90 tablet 1  . Cholecalciferol (VITAMIN D) 2000 UNITS tablet Take 1 tablet (2,000 Units total) by mouth daily. 30 tablet 11  . levalbuterol (XOPENEX) 1.25 MG/3ML nebulizer solution Take 1.25 mg by nebulization every 8 (eight) hours as needed for wheezing. 360 mL 2  . levothyroxine (SYNTHROID, LEVOTHROID) 100 MCG tablet TAKE 1 TABLET (100 MCG TOTAL) BY MOUTH DAILY BEFORE BREAKFAST. 90 tablet 0  . Multiple Vitamins-Minerals (CENTRUM SILVER PO) Take 1 tablet by mouth daily.     [name] Respiratory Therapy Supplies (FLUTTER) DEVI Blow through 4 times per cycle and repeat 3 cycles per day 1 each 0  . TURMERIC PO Take by mouth.    . umeclidinium bromide (INCRUSE ELLIPTA) 62.5 MCG/INH AEPB Inhale 1 puff into the lungs daily. 1 each 12   No current facility-administered medications on file prior to visit.

## 2016-08-31 NOTE — Telephone Encounter (Signed)
Spoke with pt, states she's tried Arnuity and it gave her a scratchy throat.  Pt requesting further recs.  From 3/5 phone note-   Pt is aware CY had changed QVAR to Incruse inhaler since Insurance will no longer cover. Pt states she has been on Arnuity and would like to know which CY would like to have her on.   Pt states she is having a Strachy throat after using Arnuity.       CY please advise.  Thanks.

## 2016-09-01 MED ORDER — FLUTICASONE PROPIONATE HFA 110 MCG/ACT IN AERO: 2.0000 | INHALATION_SPRAY | Freq: Two times a day (BID) | RESPIRATORY_TRACT | 12 refills | Status: DC

## 2016-09-01 NOTE — Telephone Encounter (Signed)
Spoke with pt. She is aware of this medication change. Rx has been sent in. Nothing further was needed.

## 2016-09-01 NOTE — Telephone Encounter (Signed)
Can try Flovent 110 inhaler     # 1, inhale 2 puffs then rinse mouth, twice daily   Refill x 12

## 2016-09-14 DIAGNOSIS — J9611 Chronic respiratory failure with hypoxia: Secondary | ICD-10-CM | POA: Diagnosis not present

## 2016-09-14 DIAGNOSIS — J449 Chronic obstructive pulmonary disease, unspecified: Secondary | ICD-10-CM | POA: Diagnosis not present

## 2016-09-27 IMAGING — CR DG CHEST 2V
2 series · 2 of 2 positions shown · non-contrast
Comparison: None.

CLINICAL DATA: Shortness of breath.  Cough.

EXAM:
CHEST  2 VIEW

[view not recorded (1 of 2)]
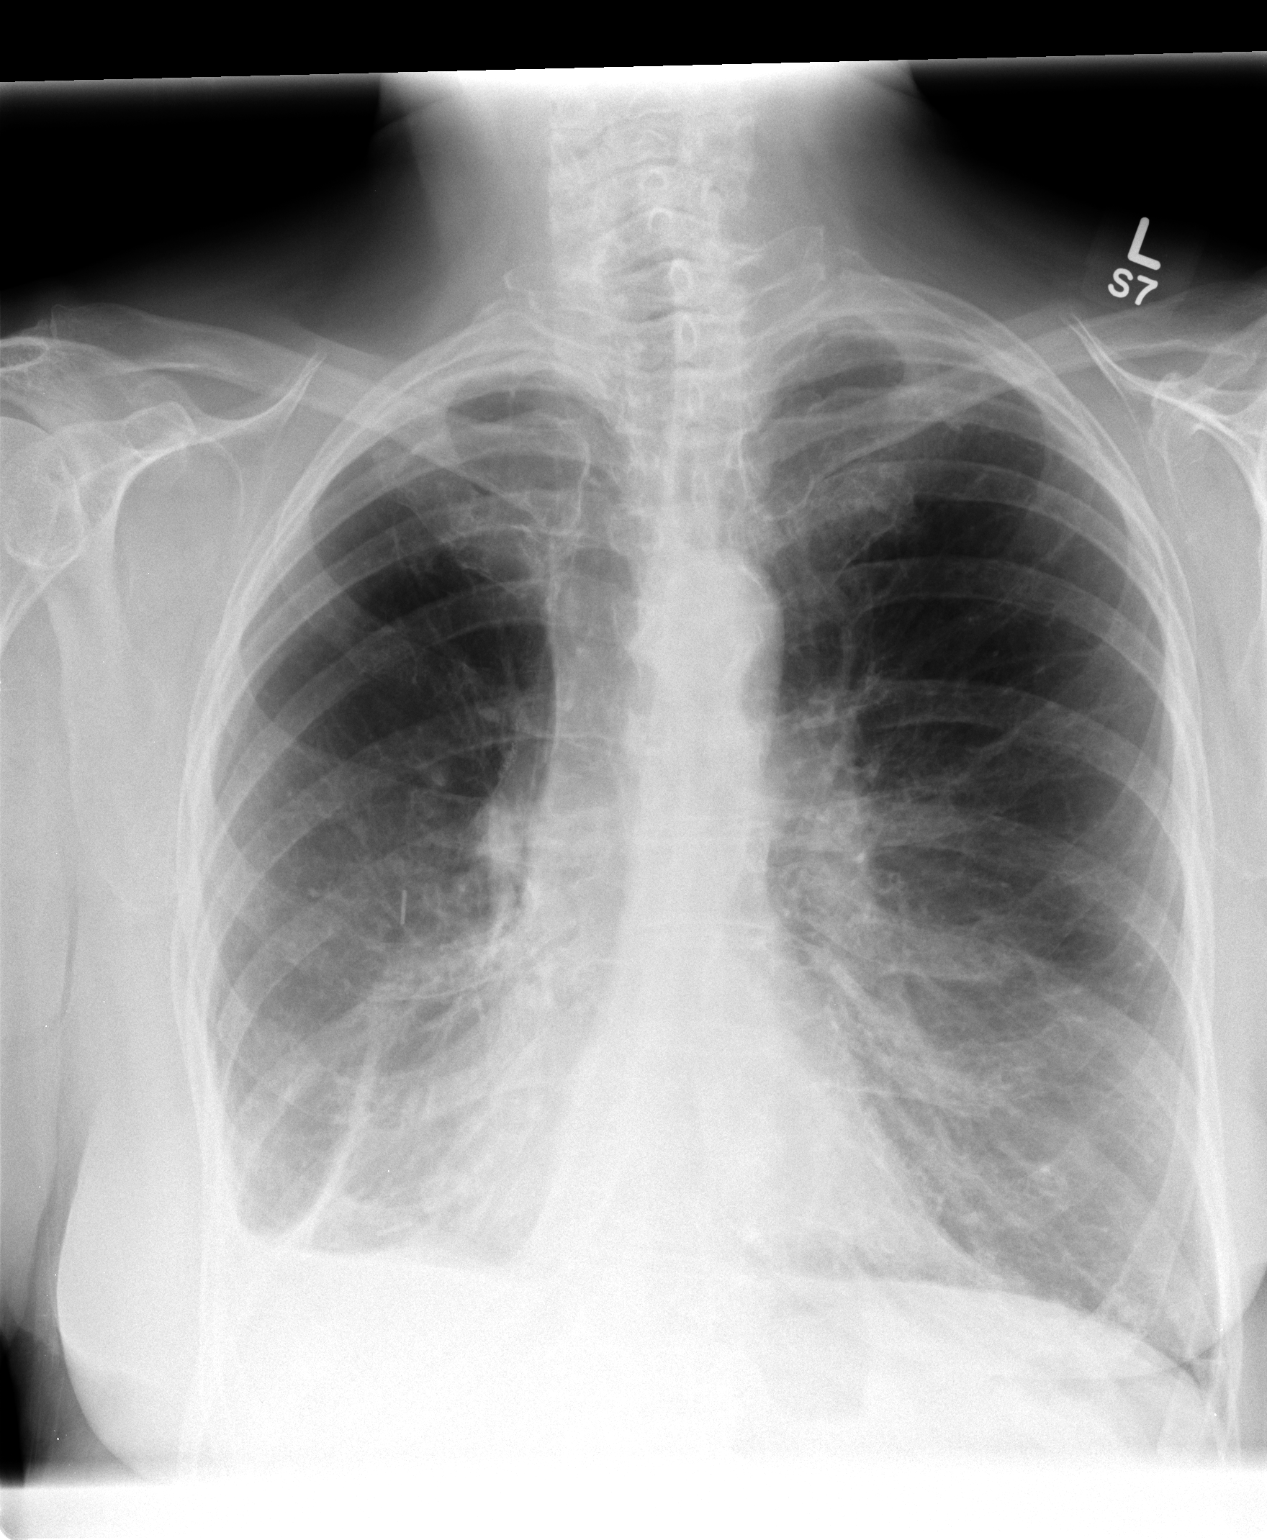

[view not recorded (2 of 2)]
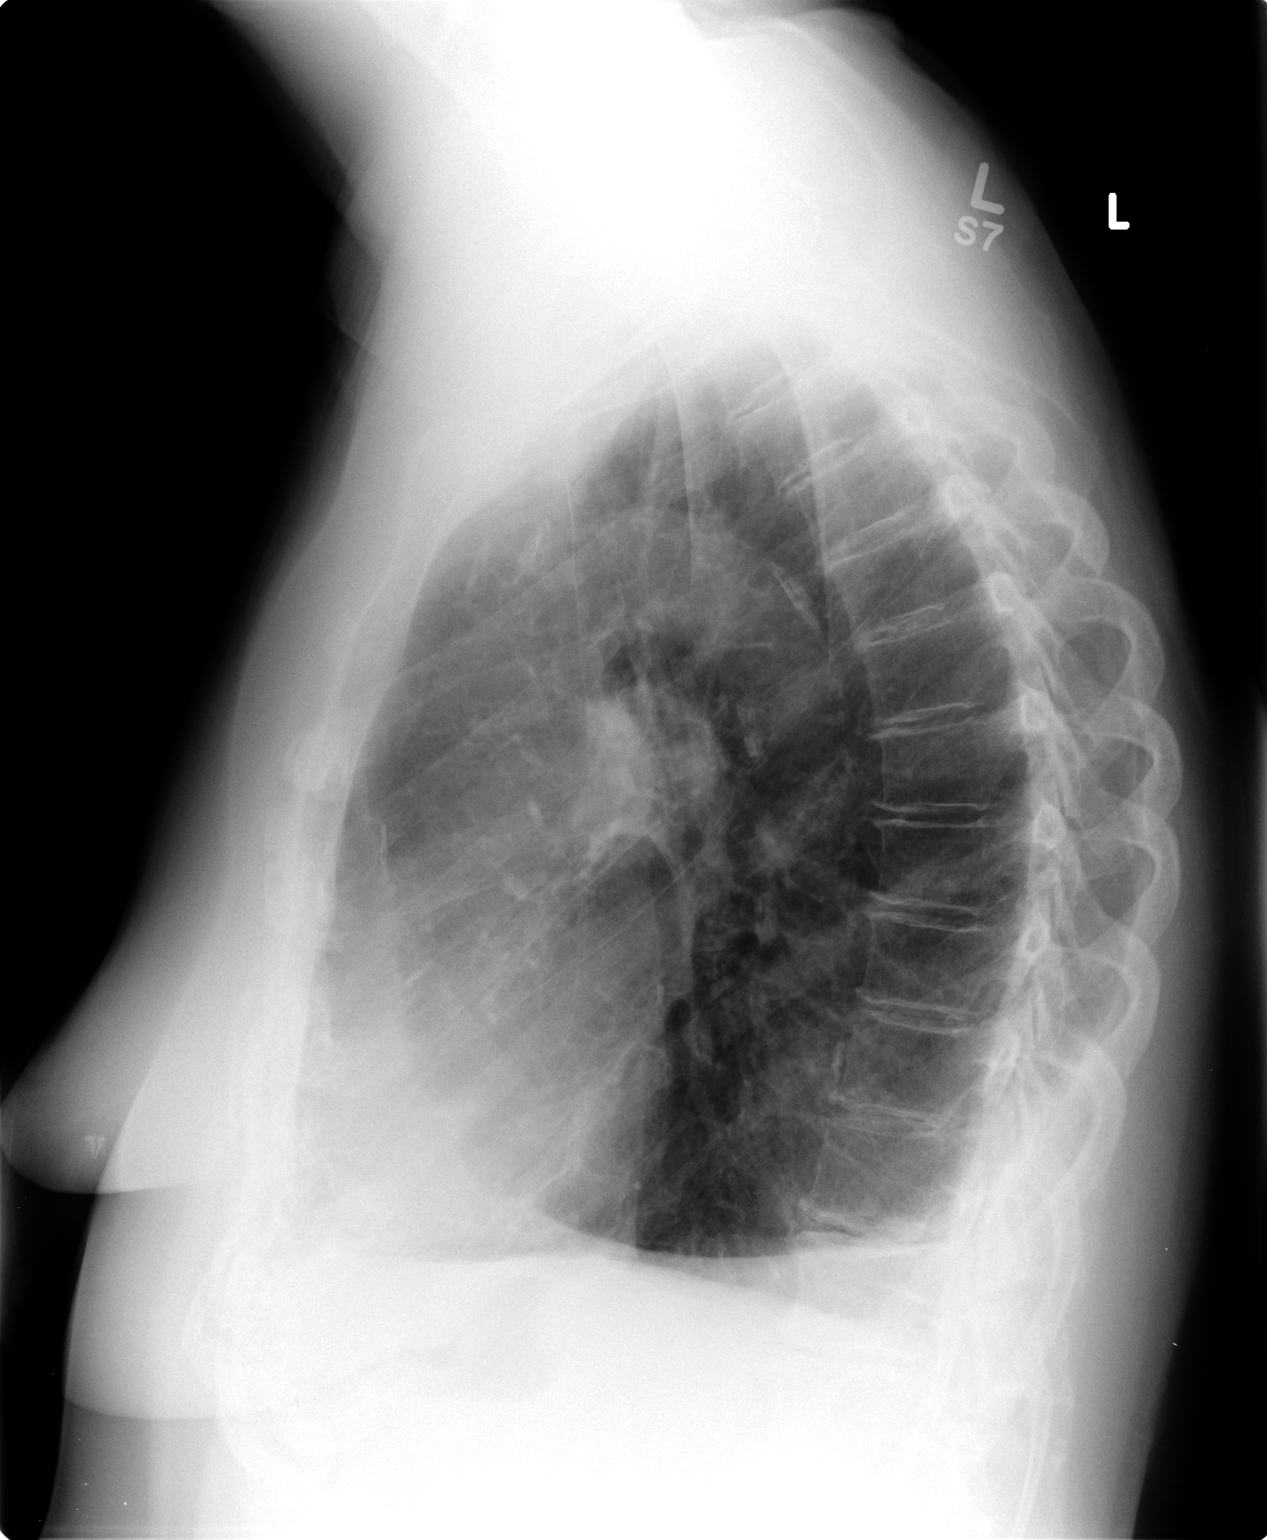

[2 of 2 positions shown; findings below may reference images not displayed]

FINDINGS: Mediastinum hilar structures are stable. Heart size stable. No
pulmonary venous congestion. Postsurgical changes right lung.
Persistent right base infiltrate and right pleural effusion. No
interim clearing. Previously identified pulmonary nodule over the
right mid lung appears slightly smaller on today's exam. Continued
follow-up chest x-rays recommended to demonstrate clearing.
IMPRESSION: Postsurgical changes right lung with pleural parenchymal scarring.
Persistent right lower lobe infiltrate and right pleural effusion.
Continued follow-up chest x-rays recommended to demonstrate clearing
and/or stability. Nodular density previously identified right mid
lung appears smaller on today's exam .

## 2016-10-14 ENCOUNTER — Ambulatory Visit: Payer: PPO | Admitting: Family

## 2016-10-15 DIAGNOSIS — J9611 Chronic respiratory failure with hypoxia: Secondary | ICD-10-CM | POA: Diagnosis not present

## 2016-10-15 DIAGNOSIS — J449 Chronic obstructive pulmonary disease, unspecified: Secondary | ICD-10-CM | POA: Diagnosis not present

## 2016-10-18 ENCOUNTER — Other Ambulatory Visit: Payer: Self-pay | Admitting: Internal Medicine

## 2016-10-18 NOTE — Telephone Encounter (Signed)
[place] controlled substance database checked.  Ok to fill medication.  

## 2016-10-19 NOTE — Telephone Encounter (Signed)
[facility] left on pharmacist vm...[name]

## 2016-10-27 ENCOUNTER — Encounter: Payer: Self-pay | Admitting: Family Medicine

## 2016-10-27 ENCOUNTER — Telehealth: Payer: Self-pay | Admitting: Internal Medicine

## 2016-10-27 ENCOUNTER — Ambulatory Visit (INDEPENDENT_AMBULATORY_CARE_PROVIDER_SITE_OTHER): Payer: PPO | Admitting: Family Medicine

## 2016-10-27 ENCOUNTER — Other Ambulatory Visit: Payer: Self-pay | Admitting: Internal Medicine

## 2016-10-27 DIAGNOSIS — M4716 Other spondylosis with myelopathy, lumbar region: Secondary | ICD-10-CM | POA: Diagnosis not present

## 2016-10-27 DIAGNOSIS — M1711 Unilateral primary osteoarthritis, right knee: Secondary | ICD-10-CM

## 2016-10-27 MED ORDER — GABAPENTIN 100 MG PO CAPS: 200.0000 mg | ORAL_CAPSULE | Freq: Every day | ORAL | 3 refills | Status: DC

## 2016-10-27 NOTE — Patient Instructions (Addendum)
Good to see you  I am sorry you are still hurting.  I believe it is coming from your back.  Gabapentin '200mg'$  at night can help a lot.  Continue the vitamin D 4000 iU daily  If not better see me again in 6-8 weeks and we could try the injections.

## 2016-10-27 NOTE — Telephone Encounter (Signed)
Will send to [facility] to follow up

## 2016-10-27 NOTE — Assessment & Plan Note (Signed)
Patient does have more of a compression of the cord causing radicular symptoms started on gabapentin declined imaging and physical therapy. Patient's will come back in 4-6 weeks worsening symptoms consider injection or further imaging.

## 2016-10-27 NOTE — Progress Notes (Signed)
[name] [address], [place] [address] Phone: [phone] Subjective:    I'm seeing this patient by the request  of:  Binnie Rail, MD   CC: Hip pain f/u  [name] is a 83 y.o. female coming in with complaint of bilateral hip pain. Seems to be more on the lateral aspect of the hips. Patient was seen by me greater than 5 months ago patient states that unfortunately the pain has not got  Better overall but still not great feels leg on right side can give out on her.  Can't tell if knee, hip or back.  Does ave back pain daily  Rates severity of pain 7/10 because it does not let her do things with her family     Patient did have x-rays of the hips bilaterally June 2017. 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 Past Medical History:  Diagnosis Date  . Allergic rhinitis   . Anxiety   . Arthritis    "back, hands; hips" (12/22/2014)  . Asthma   . Bladder atony   . Chronic bronchitis ([place])   . COPD (chronic obstructive pulmonary disease) ([place])   . Diarrhea   . Emphysema of lung ([place])   . GERD (gastroesophageal reflux disease)    "w/spicey foods" (12/22/2014)  . Hyperlipidemia   . Non-small cell carcinoma of lung, stage 1 ([place]) 2005   1.4 cm Poorly differentiated Squamous cell RUL  T1N0 resected 09/22/2003  . On home oxygen therapy    "3L q night and prn during the day" (12/22/2014)  . Pelvic cyst   . Pneumonia    "this is the 3rd time that I can remember" (12/22/2014)  . Unspecified hypothyroidism    Past Surgical History:  Procedure Laterality Date  . ABDOMINAL HYSTERECTOMY  ~ 1976  . BACK SURGERY    . CATARACT EXTRACTION W/ INTRAOCULAR LENS  IMPLANT, BILATERAL Bilateral ~ 2012  . DILATION AND CURETTAGE OF UTERUS    . LUMBAR DISC SURGERY  1970's  . LYMPH NODE DISSECTION  2005   "all my lymph nodes under breasts, went thru my back; Dr. Arlyce Dice did it"  .  Needle aspiration Pelvic cyst  2011   Dr Diona Fanti  . VIDEO ASSISTED THORACOSCOPY (VATS)/THOROCOTOMY Right 09/2003   thoracotomy, right upper lobectomy with node dissection; Dr Demetrios Loll 11/02/2010  . VIDEO BRONCHOSCOPY  03/2004   Archie Endo 11/02/2010   Social History   Social History  . Marital status: Married    Spouse name: N/A  . Number of children: N/A  . Years of education: N/A   Occupational History  . Retired Animator, School system Retired   Social History Main Topics  . Smoking status: Former Smoker    Packs/day: 1.00    Years: 42.00    Types: Cigarettes    Quit date: 09/22/2003  . Smokeless tobacco: Never Used  . Alcohol use 8.4 oz/week    14 Glasses of wine per week     Comment: 12/22/2014 "I have a large glass of wine qd"  . Drug use: No  . Sexual activity: No   Other Topics Concern  . None   Social History Narrative  . None   Allergies  Allergen Reactions  . Daliresp [Roflumilast] Nausea Only  . Tiotropium Bromide Monohydrate     Urinary retention  . Ampicillin Diarrhea and Nausea And Vomiting    GI upset  .  Azithromycin Diarrhea and Nausea And Vomiting  . Codeine Nausea And Vomiting    Makes her "crazy"  . Fludrocortisone Acetate Itching  . Lactose Intolerance (Gi) Other (See Comments)    Gas and bloating   . Milk-Related Compounds Other (See Comments)    Gas and bloating  . Nitrofurantoin Diarrhea and Nausea And Vomiting    Upset GI  . Other Other (See Comments)    No nuts and seeds because of diverticulitis  . Penicillins Nausea And Vomiting    Has patient had a PCN reaction causing immediate rash, facial/tongue/throat swelling, SOB or lightheadedness with hypotension: Yes Has patient had a PCN reaction causing severe rash involving mucus membranes or skin necrosis: No Has patient had a PCN reaction that required hospitalization No Has patient had a PCN reaction occurring within the last 10 years: No No "cillins" If all of the above  answers are "NO", then may proceed with Cephalosporin use.   . Prednisone Other (See Comments)    Insomnia after 2 days  . Sulfonamide Derivatives Diarrhea and Nausea And Vomiting   Family History  Problem Relation Age of Onset  . Heart disease Father   . Rheumatologic disease Brother   . Cancer Brother        Leukemia  . Rheumatologic disease Sister     Past medical history, social, surgical and family history all reviewed in electronic medical record.  No pertanent information unless stated regarding to the chief complaint.   Review of Systems:Review of systems updated and as accurate as of 10/27/16  No headache, visual changes, nausea, vomiting, diarrhea, dizziness, abdominal pain, skin rash, fevers, chills, night sweats, weight loss, swollen lymph nodes,, mood changes. Continues to be positive for muscle aches, body ac Objective  Blood pressure 108/62, pulse 79, resp. rate 16, height '5\' 5"'$  (1.651 m), weight 154 lb (69.9 kg), SpO2 98 %.   Systems examined below as of 10/27/16 General: NAD A&O x3 mood, affect normal continues to wear oxygen HEENT: Pupils equal, extraocular movements intact no nystagmus Respiratory: wearing oxygen Cardiovascular: No lower extremity edema, non tender Skin: Warm dry intact with no signs of infection or rash on extremities or on axial skeleton. Abdomen: Soft nontender, no masses Neuro: Cranial nerves  intact, neurovascularly intact in all extremities with 2+ DTRs and 2+ pulses. Lymph: No lymphadenopathy appreciated today  Gait patient in a wheelchair MSK: Non tender with full range of motion and good stability and symmetric strength and tone of shoulders, elbows, wrist,  and ankles bilaterally.     Knee:right valgus deformity noted.  Tender to palpation over medial and PF joint line.  ROM full in flexion and extension and lower leg rotation. instability with valgus force.  painful patellar compression. Patellar glide with moderate  crepitus. Patellar and quadriceps tendons unremarkable. Hamstring and quadriceps strength is normal. Contralateral knee arthritic changes but minimal   URK:[id number] ROM Range of motion is limited in all planes bilaterally by 5. Discomfort over the greater trochanter area right greater than left Strength 4+-5 but symmetric Pelvic alignment unremarkable to inspection and palpation. Greater trochanter without tenderness to palpation. No pain over the piriformis. Positive pain with Corky Sox bilaterally Positive straight leg test bilateral increasing tenderness in the paraspinal musculature of the lumbar spine.   Impression and Recommendations:     This case required medical decision making of moderate complexity.      Note: This dictation was prepared with Dragon dictation along with smaller phrase technology. Any transcriptional errors that  result from this process are unintentional.

## 2016-10-27 NOTE — Assessment & Plan Note (Signed)
Discussed possible bracing which patient declined declined injection. Discussed home exercises discussed possible walker follow-up 4 weeks

## 2016-10-27 NOTE — Telephone Encounter (Signed)
Form has been completed and patient is aware. Pt request we mail the form to her. Placed in mail and nothing more needed at this time.

## 2016-10-31 ENCOUNTER — Telehealth: Payer: Self-pay | Admitting: Internal Medicine

## 2016-10-31 NOTE — Telephone Encounter (Signed)
Sent to Dr. Tamala Julian for review.

## 2016-10-31 NOTE — Telephone Encounter (Signed)
Pt called in and said that the day after she seen Dr Tamala Julian her leg gave out and almost fell.  She would like for you to call her to see what she should do from here?

## 2016-10-31 NOTE — Telephone Encounter (Signed)
Need to come back and try injections and xray of back.  Next available, no work in

## 2016-11-01 NOTE — Telephone Encounter (Signed)
Spoke with patient. She is worried about falling as she is very off balance she said due to the pain. Scheduled on Thursday morning.

## 2016-11-02 NOTE — Progress Notes (Signed)
[name] [address] [place], [place] [address] Phone: [phone] Subjective:    I'm seeing this patient by the request  of:  Binnie Rail, MD   CC: Hip pain f/u  HEN:[id number]  [name] is a 83 y.o. female coming in with complaint of bilateral hip pain. Patient was seen by me and there was concern for patient having either a lumbar radiculopathy versus bilateral greater Trochanteric bursitis. Patient will try conservative therapy. Declined any type of injection. With the course last week has had significant worsening symptoms and is almost fallen multiple times secondary to pain and some weakness of the lower extremities. Patient states Continues to have discomfort. Patient is having difficult he overall. Feels like she is worsening.    Patient did have x-rays of the hips bilaterally June 2017. 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 Past Medical History:  Diagnosis Date  . Allergic rhinitis   . Anxiety   . Arthritis    "back, hands; hips" (12/22/2014)  . Asthma   . Bladder atony   . Chronic bronchitis ([facility])   . COPD (chronic obstructive pulmonary disease) ([facility])   . Diarrhea   . Emphysema of lung ([facility])   . GERD (gastroesophageal reflux disease)    "w/spicey foods" (12/22/2014)  . Hyperlipidemia   . Non-small cell carcinoma of lung, stage 1 ([facility]) 2005   1.4 cm Poorly differentiated Squamous cell RUL  T1N0 resected 09/22/2003  . On home oxygen therapy    "3L q night and prn during the day" (12/22/2014)  . Pelvic cyst   . Pneumonia    "this is the 3rd time that I can remember" (12/22/2014)  . Unspecified hypothyroidism    Past Surgical History:  Procedure Laterality Date  . ABDOMINAL HYSTERECTOMY  ~ 1976  . BACK SURGERY    . CATARACT EXTRACTION W/ INTRAOCULAR LENS  IMPLANT, BILATERAL Bilateral ~ 2012  . DILATION AND CURETTAGE OF UTERUS    . LUMBAR DISC SURGERY  1970's  . LYMPH  NODE DISSECTION  2005   "all my lymph nodes under breasts, went thru my back; Dr. Arlyce Dice did it"  . Needle aspiration Pelvic cyst  2011   Dr Diona Fanti  . VIDEO ASSISTED THORACOSCOPY (VATS)/THOROCOTOMY Right 09/2003   thoracotomy, right upper lobectomy with node dissection; Dr Demetrios Loll 11/02/2010  . VIDEO BRONCHOSCOPY  03/2004   Archie Endo 11/02/2010   Social History   Social History  . Marital status: Married    Spouse name: N/A  . Number of children: N/A  . Years of education: N/A   Occupational History  . Retired Animator, School system Retired   Social History Main Topics  . Smoking status: Former Smoker    Packs/day: 1.00    Years: 42.00    Types: Cigarettes    Quit date: 09/22/2003  . Smokeless tobacco: Never Used  . Alcohol use 8.4 oz/week    14 Glasses of wine per week     Comment: 12/22/2014 "I have a large glass of wine qd"  . Drug use: No  . Sexual activity: No   Other Topics Concern  . None   Social History Narrative  . None   Allergies  Allergen Reactions  . Daliresp [Roflumilast] Nausea Only  . Tiotropium Bromide Monohydrate     Urinary retention  . Ampicillin Diarrhea and Nausea And Vomiting    GI upset  . Azithromycin Diarrhea and  Nausea And Vomiting  . Codeine Nausea And Vomiting    Makes her "crazy"  . Fludrocortisone Acetate Itching  . Lactose Intolerance (Gi) Other (See Comments)    Gas and bloating   . Milk-Related Compounds Other (See Comments)    Gas and bloating  . Nitrofurantoin Diarrhea and Nausea And Vomiting    Upset GI  . Other Other (See Comments)    No nuts and seeds because of diverticulitis  . Penicillins Nausea And Vomiting    Has patient had a PCN reaction causing immediate rash, facial/tongue/throat swelling, SOB or lightheadedness with hypotension: Yes Has patient had a PCN reaction causing severe rash involving mucus membranes or skin necrosis: No Has patient had a PCN reaction that required hospitalization No Has  patient had a PCN reaction occurring within the last 10 years: No No "cillins" If all of the above answers are "NO", then may proceed with Cephalosporin use.   . Prednisone Other (See Comments)    Insomnia after 2 days  . Sulfonamide Derivatives Diarrhea and Nausea And Vomiting   Family History  Problem Relation Age of Onset  . Heart disease Father   . Rheumatologic disease Brother   . Cancer Brother        Leukemia  . Rheumatologic disease Sister     Past medical history, social, surgical and family history all reviewed in electronic medical record.  No pertanent information unless stated regarding to the chief complaint.   Review of Systems:Review of systems updated and as accurate as of 11/03/16  No headache, visual changes, nausea, vomiting, diarrhea, dizziness, abdominal pain, skin rash, fevers, chills, night sweats, weight loss, swollen lymph nodes,, mood changes.  Patient continues to have positive body aches muscle aches Objective  Blood pressure 110/66, pulse 85, height '5\' 5"'$  (1.651 m), SpO2 96 %.   Systems examined below as of 11/03/16 General: NAD A&O x3 mood, affect normal  HEENT: Pupils equal, extraocular movements intact no nystagmus Respiratory: not short of breath at rest or with speaking Cardiovascular: No lower extremity edema, non tender Skin: Warm dry intact with no signs of infection or rash on extremities or on axial skeleton. Abdomen: Soft nontender, no masses Neuro: Cranial nerves  intact, neurovascularly intact in all extremities with 2+ DTRs and 2+ pulses. Lymph: No lymphadenopathy appreciated today  Gait Patient does have a broad based gait when getting out of the wheelchair  MSK: Non tender with full range of motion and good stability and symmetric strength and tone of shoulders, elbows, wrist,  knee and ankles bilaterally.  Arthritic changes of multiple joints   Contralateral knee arthritic changes but minimal   FGH:[id number] ROM Range of motion  is limited in all planes bilaterally by 5. Discomfort over the greater trochanter area right greater than left Strength 3 out of 5 strength which is worse. Patient also has 4 out of 5 strength that is symmetric to worse than previous exam of the lower extremities. Pelvic alignment unremarkable to inspection and palpation. Greater trochanter without tenderness to palpation. No pain over the piriformis. Positive pain with Corky Sox bilaterallyWorsening discomfort as well over the back. We'll see the paraspinal [place] lumbar spine Continue slt still present as well    Procedure: Real-time Ultrasound Guided Injection of right greater trochanteric bursitis secondary to patient's body habitus Device: GE Logiq Q7 Ultrasound guided injection is preferred based studies that show increased duration, increased effect, greater accuracy, decreased procedural pain, increased response rate, and decreased cost with ultrasound guided versus  blind injection.  Verbal informed consent obtained.  Time-out conducted.  Noted no overlying erythema, induration, or other signs of local infection.  Skin prepped in a sterile fashion.  Local anesthesia: Topical Ethyl chloride.  With sterile technique and under real time ultrasound guidance:  Greater trochanteric area was visualized and patient's bursa was noted. A 22-gauge 3 inch needle was inserted and 4 cc of 0.5% Marcaine and 1 cc of Kenalog 40 mg/dL was injected. Pictures taken Completed without difficulty  Pain immediately resolved suggesting accurate placement of the medication.  Advised to call if fevers/chills, erythema, induration, drainage, or persistent bleeding.  Images permanently stored and available for review in the ultrasound unit.  Impression: Technically successful ultrasound guided injection.      Procedure: Real-time Ultrasound Guided Injection of left  greater trochanteric bursitis secondary to patient's body habitus Device: GE Logiq Q7    Ultrasound guided injection is preferred based studies that show increased duration, increased effect, greater accuracy, decreased procedural pain, increased response rate, and decreased cost with ultrasound guided versus blind injection.  Verbal informed consent obtained.  Time-out conducted.  Noted no overlying erythema, induration, or other signs of local infection.  Skin prepped in a sterile fashion.  Local anesthesia: Topical Ethyl chloride.  With sterile technique and under real time ultrasound guidance:  Greater trochanteric area was visualized and patient's bursa was noted. A 22-gauge 3 inch needle was inserted and 4 cc of 0.5% Marcaine and 1 cc of Kenalog 40 mg/dL was injected. Pictures taken Completed without difficulty  Pain immediately resolved suggesting accurate placement of the medication.  Advised to call if fevers/chills, erythema, induration, drainage, or persistent bleeding.  Images permanently stored and available for review in the ultrasound unit.  Impression: Technically successful ultrasound guided injection. Impression and Recommendations:     This case required medical decision making of moderate complexity.      Note: This dictation was prepared with Dragon dictation along with smaller phrase technology. Any transcriptional errors that result from this process are unintentional.

## 2016-11-03 ENCOUNTER — Ambulatory Visit: Payer: Self-pay

## 2016-11-03 ENCOUNTER — Ambulatory Visit (INDEPENDENT_AMBULATORY_CARE_PROVIDER_SITE_OTHER): Payer: PPO | Admitting: Family Medicine

## 2016-11-03 ENCOUNTER — Ambulatory Visit (INDEPENDENT_AMBULATORY_CARE_PROVIDER_SITE_OTHER)
Admission: RE | Admit: 2016-11-03 | Discharge: 2016-11-03 | Disposition: A | Discharge status: Home / Self Care | Payer: PPO | Source: Ambulatory Visit | Attending: Family Medicine | Admitting: Family Medicine

## 2016-11-03 ENCOUNTER — Encounter: Payer: Self-pay | Admitting: Family Medicine

## 2016-11-03 VITALS — BP 110/66 | HR 85 | Ht 65.0 in

## 2016-11-03 DIAGNOSIS — M7062 Trochanteric bursitis, left hip: Principal | ICD-10-CM

## 2016-11-03 DIAGNOSIS — M7061 Trochanteric bursitis, right hip: Secondary | ICD-10-CM | POA: Diagnosis not present

## 2016-11-03 DIAGNOSIS — M48061 Spinal stenosis, lumbar region without neurogenic claudication: Secondary | ICD-10-CM

## 2016-11-03 DIAGNOSIS — M5136 Other intervertebral disc degeneration, lumbar region: Secondary | ICD-10-CM | POA: Diagnosis not present

## 2016-11-03 NOTE — Patient Instructions (Addendum)
Good to see you  We tried injections in both hips today  If not better on Monday then call and we will get MRI Continue gabapentin

## 2016-11-03 NOTE — Assessment & Plan Note (Signed)
Patient given bilateral injections today. I do think that most of this is likely more from a lumbar radiculopathy as well as spinal stenosis. X-rays pending. We discussed icing regimen and exercises. Has given trial of topical anti-inflammatories. Discuss if no significant improvement in the next 96 hours I would consider further imaging second due to the increasing weakness.

## 2016-11-08 ENCOUNTER — Telehealth: Payer: Self-pay

## 2016-11-08 DIAGNOSIS — G8929 Other chronic pain: Secondary | ICD-10-CM

## 2016-11-08 DIAGNOSIS — M545 Low back pain: Principal | ICD-10-CM

## 2016-11-08 NOTE — Telephone Encounter (Signed)
Spoke with patient. She had some stinging in her leg that she had the injection in up until yesterday 5/21. She said that she is feeling better today. Did note that she had tingling in the opposite leg as well for one day. I explained that this is likely not related but that the stinging in the injected sight is common. She also wanted the results of her xray which was provided to patient per Dr. Thompson Caul reading of xray in chart. Patient will call back to schedule MRI if her symptoms persist.

## 2016-11-14 DIAGNOSIS — J449 Chronic obstructive pulmonary disease, unspecified: Secondary | ICD-10-CM | POA: Diagnosis not present

## 2016-11-14 DIAGNOSIS — J9611 Chronic respiratory failure with hypoxia: Secondary | ICD-10-CM | POA: Diagnosis not present

## 2016-11-18 NOTE — Telephone Encounter (Signed)
Pain from back.  If want we will need MRI lumbar.

## 2016-11-18 NOTE — Telephone Encounter (Signed)
Patient is calling again about injections. She states it is feeling the same as before. If you could follow up with her. Thank you.

## 2016-11-18 NOTE — Addendum Note (Signed)
Addended by: Douglass Rivers T on: 11/18/2016 02:27 PM   Modules accepted: Orders

## 2016-11-18 NOTE — Telephone Encounter (Signed)
Discussed with pt. MRI ordered.

## 2016-11-24 ENCOUNTER — Other Ambulatory Visit: Payer: Self-pay | Admitting: Internal Medicine

## 2016-11-30 ENCOUNTER — Ambulatory Visit
Admission: RE | Admit: 2016-11-30 | Discharge: 2016-11-30 | Disposition: A | Discharge status: Home / Self Care | Payer: PPO | Source: Ambulatory Visit | Attending: Family Medicine | Admitting: Family Medicine

## 2016-11-30 DIAGNOSIS — M48061 Spinal stenosis, lumbar region without neurogenic claudication: Secondary | ICD-10-CM | POA: Diagnosis not present

## 2016-11-30 DIAGNOSIS — M545 Low back pain, unspecified: Secondary | ICD-10-CM

## 2016-11-30 DIAGNOSIS — G8929 Other chronic pain: Secondary | ICD-10-CM

## 2016-12-01 ENCOUNTER — Other Ambulatory Visit: Payer: Self-pay

## 2016-12-01 DIAGNOSIS — M48061 Spinal stenosis, lumbar region without neurogenic claudication: Secondary | ICD-10-CM

## 2016-12-01 NOTE — Progress Notes (Signed)
Spoke with patient. She would like to go ahead with the epidural. Order has been placed.

## 2016-12-06 ENCOUNTER — Ambulatory Visit
Admission: RE | Admit: 2016-12-06 | Discharge: 2016-12-06 | Disposition: A | Discharge status: Home / Self Care | Payer: PPO | Source: Ambulatory Visit | Attending: Family Medicine | Admitting: Family Medicine

## 2016-12-06 DIAGNOSIS — M545 Low back pain: Secondary | ICD-10-CM | POA: Diagnosis not present

## 2016-12-06 DIAGNOSIS — M48061 Spinal stenosis, lumbar region without neurogenic claudication: Secondary | ICD-10-CM

## 2016-12-06 MED ORDER — METHYLPREDNISOLONE ACETATE 40 MG/ML INJ SUSP (RADIOLOG
120.0000 mg | Freq: Once | INTRAMUSCULAR | Status: AC
  Administered 2016-12-06: 120 mg via EPIDURAL

## 2016-12-06 MED ORDER — IOPAMIDOL (ISOVUE-M 200) INJECTION 41%
1.0000 mL | Freq: Once | INTRAMUSCULAR | Status: AC
  Administered 2016-12-06: 1 mL via EPIDURAL

## 2016-12-06 NOTE — Discharge Instructions (Signed)

## 2016-12-08 DIAGNOSIS — R739 Hyperglycemia, unspecified: Secondary | ICD-10-CM | POA: Insufficient documentation

## 2016-12-08 NOTE — Assessment & Plan Note (Addendum)
Check lipid panel  Continue daily statin  healthy diet encouraged

## 2016-12-08 NOTE — Patient Instructions (Addendum)
Test(s) ordered today. Your results will be released to [place] (or called to you) after review, usually within 72hours after test completion. If any changes need to be made, you will be notified at that same time.  All other Health Maintenance issues reviewed.   All recommended immunizations and age-appropriate screenings are up-to-date or discussed.  No immunizations administered today.   A bone density has been ordered.   Medications reviewed and updated.  No changes recommended at this time.  Your prescription(s) have been submitted to your pharmacy. Please take as directed and contact our office if you believe you are having problem(s) with the medication(s).  Please followup in 6 months   Health Maintenance, Female Adopting a healthy lifestyle and getting preventive care can go a long way to promote health and wellness. Talk with your health care provider about what schedule of regular examinations is right for you. This is a good chance for you to check in with your provider about disease prevention and staying healthy. In between checkups, there are plenty of things you can do on your own. Experts have done a lot of research about which lifestyle changes and preventive measures are most likely to keep you healthy. Ask your health care provider for more information. Weight and diet Eat a healthy diet  Be sure to include plenty of vegetables, fruits, low-fat dairy products, and lean protein.  Do not eat a lot of foods high in solid fats, added sugars, or salt.  Get regular exercise. This is one of the most important things you can do for your health. ? Most adults should exercise for at least 150 minutes each week. The exercise should increase your heart rate and make you sweat (moderate-intensity exercise). ? Most adults should also do strengthening exercises at least twice a week. This is in addition to the moderate-intensity exercise.  Maintain a healthy weight  Body mass  index (BMI) is a measurement that can be used to identify possible weight problems. It estimates body fat based on height and weight. Your health care provider can help determine your BMI and help you achieve or maintain a healthy weight.  For females 8 years of age and older: ? A BMI below 18.5 is considered underweight. ? A BMI of 18.5 to 24.9 is normal. ? A BMI of 25 to 29.9 is considered overweight. ? A BMI of 30 and above is considered obese.  Watch levels of cholesterol and blood lipids  You should start having your blood tested for lipids and cholesterol at 81 years of age, then have this test every 5 years.  You may need to have your cholesterol levels checked more often if: ? Your lipid or cholesterol levels are high. ? You are older than 81 years of age. ? You are at high risk for heart disease.  Cancer screening Lung Cancer  Lung cancer screening is recommended for adults 71-46 years old who are at high risk for lung cancer because of a history of smoking.  A yearly low-dose CT scan of the lungs is recommended for people who: ? Currently smoke. ? Have quit within the past 15 years. ? Have at least a 30-pack-year history of smoking. A pack year is smoking an average of one pack of cigarettes a day for 1 year.  Yearly screening should continue until it has been 15 years since you quit.  Yearly screening should stop if you develop a health problem that would prevent you from having lung cancer treatment.  Breast Cancer  Practice breast self-awareness. This means understanding how your breasts normally appear and feel.  It also means doing regular breast self-exams. Let your health care provider know about any changes, no matter how small.  If you are in your 20s or 30s, you should have a clinical breast exam (CBE) by a health care provider every 1-3 years as part of a regular health exam.  If you are 67 or older, have a CBE every year. Also consider having a breast  X-ray (mammogram) every year.  If you have a family history of breast cancer, talk to your health care provider about genetic screening.  If you are at high risk for breast cancer, talk to your health care provider about having an MRI and a mammogram every year.  Breast cancer gene (BRCA) assessment is recommended for women who have family members with BRCA-related cancers. BRCA-related cancers include: ? Breast. ? Ovarian. ? Tubal. ? Peritoneal cancers.  Results of the assessment will determine the need for genetic counseling and BRCA1 and BRCA2 testing.  Cervical Cancer Your health care provider may recommend that you be screened regularly for cancer of the pelvic organs (ovaries, uterus, and vagina). This screening involves a pelvic examination, including checking for microscopic changes to the surface of your cervix (Pap test). You may be encouraged to have this screening done every 3 years, beginning at age 75.  For women ages 4-65, health care providers may recommend pelvic exams and Pap testing every 3 years, or they may recommend the Pap and pelvic exam, combined with testing for human papilloma virus (HPV), every 5 years. Some types of HPV increase your risk of cervical cancer. Testing for HPV may also be done on women of any age with unclear Pap test results.  Other health care providers may not recommend any screening for nonpregnant women who are considered low risk for pelvic cancer and who do not have symptoms. Ask your health care provider if a screening pelvic exam is right for you.  If you have had past treatment for cervical cancer or a condition that could lead to cancer, you need Pap tests and screening for cancer for at least 20 years after your treatment. If Pap tests have been discontinued, your risk factors (such as having a new sexual partner) need to be reassessed to determine if screening should resume. Some women have medical problems that increase the chance of  getting cervical cancer. In these cases, your health care provider may recommend more frequent screening and Pap tests.  Colorectal Cancer  This type of cancer can be detected and often prevented.  Routine colorectal cancer screening usually begins at 81 years of age and continues through 81 years of age.  Your health care provider may recommend screening at an earlier age if you have risk factors for colon cancer.  Your health care provider may also recommend using home test kits to check for hidden blood in the stool.  A small camera at the end of a tube can be used to examine your colon directly (sigmoidoscopy or colonoscopy). This is done to check for the earliest forms of colorectal cancer.  Routine screening usually begins at age 37.  Direct examination of the colon should be repeated every 5-10 years through 81 years of age. However, you may need to be screened more often if early forms of precancerous polyps or small growths are found.  Skin Cancer  Check your skin from head to toe regularly.  Tell  your health care provider about any new moles or changes in moles, especially if there is a change in a mole's shape or color.  Also tell your health care provider if you have a mole that is larger than the size of a pencil eraser.  Always use sunscreen. Apply sunscreen liberally and repeatedly throughout the day.  Protect yourself by wearing long sleeves, pants, a wide-brimmed hat, and sunglasses whenever you are outside.  Heart disease, diabetes, and high blood pressure  High blood pressure causes heart disease and increases the risk of stroke. High blood pressure is more likely to develop in: ? People who have blood pressure in the high end of the normal range (130-139/85-89 mm Hg). ? People who are overweight or obese. ? People who are African American.  If you are 39-65 years of age, have your blood pressure checked every 3-5 years. If you are 20 years of age or older,  have your blood pressure checked every year. You should have your blood pressure measured twice-once when you are at a hospital or clinic, and once when you are not at a hospital or clinic. Record the average of the two measurements. To check your blood pressure when you are not at a hospital or clinic, you can use: ? An automated blood pressure machine at a pharmacy. ? A home blood pressure monitor.  If you are between 56 years and 32 years old, ask your health care provider if you should take aspirin to prevent strokes.  Have regular diabetes screenings. This involves taking a blood sample to check your fasting blood sugar level. ? If you are at a normal weight and have a low risk for diabetes, have this test once every three years after 81 years of age. ? If you are overweight and have a high risk for diabetes, consider being tested at a younger age or more often. Preventing infection Hepatitis B  If you have a higher risk for hepatitis B, you should be screened for this virus. You are considered at high risk for hepatitis B if: ? You were born in a country where hepatitis B is common. Ask your health care provider which countries are considered high risk. ? Your parents were born in a high-risk country, and you have not been immunized against hepatitis B (hepatitis B vaccine). ? You have HIV or AIDS. ? You use needles to inject street drugs. ? You live with someone who has hepatitis B. ? You have had sex with someone who has hepatitis B. ? You get hemodialysis treatment. ? You take certain medicines for conditions, including cancer, organ transplantation, and autoimmune conditions.  Hepatitis C  Blood testing is recommended for: ? Everyone born from [birth year] through 1963. ? Anyone with known risk factors for hepatitis C.  Sexually transmitted infections (STIs)  You should be screened for sexually transmitted infections (STIs) including gonorrhea and chlamydia if: ? You are sexually  active and are younger than 81 years of age. ? You are older than 81 years of age and your health care provider tells you that you are at risk for this type of infection. ? Your sexual activity has changed since you were last screened and you are at an increased risk for chlamydia or gonorrhea. Ask your health care provider if you are at risk.  If you do not have HIV, but are at risk, it may be recommended that you take a prescription medicine daily to prevent HIV infection. This is called pre-exposure  prophylaxis (PrEP). You are considered at risk if: ? You are sexually active and do not regularly use condoms or know the HIV status of your partner(s). ? You take drugs by injection. ? You are sexually active with a partner who has HIV.  Talk with your health care provider about whether you are at high risk of being infected with HIV. If you choose to begin PrEP, you should first be tested for HIV. You should then be tested every 3 months for as long as you are taking PrEP. Pregnancy  If you are premenopausal and you may become pregnant, ask your health care provider about preconception counseling.  If you may become pregnant, take 400 to 800 micrograms (mcg) of folic acid every day.  If you want to prevent pregnancy, talk to your health care provider about birth control (contraception). Osteoporosis and menopause  Osteoporosis is a disease in which the bones lose minerals and strength with aging. This can result in serious bone fractures. Your risk for osteoporosis can be identified using a bone density scan.  If you are 70 years of age or older, or if you are at risk for osteoporosis and fractures, ask your health care provider if you should be screened.  Ask your health care provider whether you should take a calcium or vitamin D supplement to lower your risk for osteoporosis.  Menopause may have certain physical symptoms and risks.  Hormone replacement therapy may reduce some of these  symptoms and risks. Talk to your health care provider about whether hormone replacement therapy is right for you. Follow these instructions at home:  Schedule regular health, dental, and eye exams.  Stay current with your immunizations.  Do not use any tobacco products including cigarettes, chewing tobacco, or electronic cigarettes.  If you are pregnant, do not drink alcohol.  If you are breastfeeding, limit how much and how often you drink alcohol.  Limit alcohol intake to no more than 1 drink per day for nonpregnant women. One drink equals 12 ounces of beer, 5 ounces of wine, or 1 ounces of hard liquor.  Do not use street drugs.  Do not share needles.  Ask your health care provider for help if you need support or information about quitting drugs.  Tell your health care provider if you often feel depressed.  Tell your health care provider if you have ever been abused or do not feel safe at home. This information is not intended to replace advice given to you by your health care provider. Make sure you discuss any questions you have with your health care provider. Document Released: 12/20/2010 Document Revised: 11/12/2015 Document Reviewed: 03/10/2015 Elsevier Interactive Patient Education  [employer].

## 2016-12-08 NOTE — Assessment & Plan Note (Signed)
Check tsh  Titrate med dose if needed  

## 2016-12-08 NOTE — Progress Notes (Signed)
Subjective:    Patient ID: [name], female    DOB: June 13, 1933, 83 y.o.   MRN: [id number]  HPI She is here for a physical exam.   A couple of weeks ago she had dysuria.  It lasted about one week.  It did get better.  She does go to the bathroom frequently, which is not new.  She is concerned she may have an infection and would like her urine checked.  Her bladder has fallen.    Medications and allergies reviewed with patient and updated if appropriate.  Patient Active Problem List   Diagnosis Date Noted  . Hyperglycemia 12/08/2016  . Degenerative arthritis of right knee 10/27/2016  . 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 Greater trochanteric bursitis of both hips 04/08/2016  . Pain in both knees 04/08/2016  . Chronic midline low back pain without sciatica 04/08/2016  . Dysuria 12/02/2015  . Hip pain 12/02/2015  . Chest pain 06/05/2015  . Chronic respiratory failure with hypoxia ([place]) 03/07/2015  . CAP (community acquired pneumonia) 12/22/2014  . Hypotension 12/22/2014  . Oral ulcer 11/24/2014  . Diverticulitis 05/24/2014  . Anxiety   . Bladder prolapse, female, acquired   . Arthritis   . Non-small cell carcinoma of lung, stage 1 ([place]) 01/10/2012  . Neuropathy, peripheral 01/28/2011  . COPD mixed type ([place]) 09/08/2010  . Dyslipidemia   . ATONY OF BLADDER 08/10/2009  . Hypothyroidism 03/12/2008  . Seasonal allergic rhinitis 10/10/2007    Current Outpatient Prescriptions on File Prior to Visit  Medication Sig Dispense Refill  . acetaminophen (TYLENOL ARTHRITIS PAIN) 650 MG CR tablet Take 650 mg by mouth daily as needed for pain.     [name] albuterol (PROVENTIL HFA;VENTOLIN HFA) 108 (90 Base) MCG/ACT inhaler Inhale 2 puffs into the lungs every 6 (six) hours as needed for wheezing or shortness of breath. 1 Inhaler 12  . ALPRAZolam (XANAX) 1 MG tablet TAKE 1 TABLET BY MOUTH AT BEDTIME 30 tablet 1  . aspirin EC 81 MG tablet Take 81 mg  by mouth 2 (two) times a week.     [name] atorvastatin (LIPITOR) 20 MG tablet Take 1 tablet (20 mg total) by mouth daily. 90 tablet 1  . Cholecalciferol (VITAMIN D) 2000 UNITS tablet Take 1 tablet (2,000 Units total) by mouth daily. 30 tablet 11  . fluticasone (FLOVENT HFA) 110 MCG/ACT inhaler Inhale 2 puffs into the lungs 2 (two) times daily. 1 Inhaler 12  . gabapentin (NEURONTIN) 100 MG capsule Take 2 capsules (200 mg total) by mouth at bedtime. 60 capsule 3  . levalbuterol (XOPENEX) 1.25 MG/3ML nebulizer solution Take 1.25 mg by nebulization every 8 (eight) hours as needed for wheezing. 360 mL 2  . levothyroxine (SYNTHROID, LEVOTHROID) 100 MCG tablet Take 1 tablet (100 mcg total) by mouth daily before breakfast. 30 tablet 0  . Multiple Vitamins-Minerals (CENTRUM SILVER PO) Take 1 tablet by mouth daily.     [name] Respiratory Therapy Supplies (FLUTTER) DEVI Blow through 4 times per cycle and repeat 3 cycles per day 1 each 0   No current facility-administered medications on file prior to visit.     Past Medical History:  Diagnosis Date  . Allergic rhinitis   . Anxiety   . Arthritis    "back, hands; hips" (12/22/2014)  . Asthma   . Bladder atony   . Chronic bronchitis ([place])   . COPD (chronic obstructive pulmonary disease) ([place])   . Diarrhea   .  Emphysema of lung ([place])   . GERD (gastroesophageal reflux disease)    "w/spicey foods" (12/22/2014)  . Hyperlipidemia   . Non-small cell carcinoma of lung, stage 1 ([place]) 2005   1.4 cm Poorly differentiated Squamous cell RUL  T1N0 resected 09/22/2003  . On home oxygen therapy    "3L q night and prn during the day" (12/22/2014)  . Pelvic cyst   . Pneumonia    "this is the 3rd time that I can remember" (12/22/2014)  . Unspecified hypothyroidism     Past Surgical History:  Procedure Laterality Date  . ABDOMINAL HYSTERECTOMY  ~ 1976  . BACK SURGERY    . CATARACT EXTRACTION W/ INTRAOCULAR LENS  IMPLANT, BILATERAL Bilateral ~ 2012  . DILATION AND CURETTAGE OF  UTERUS    . LUMBAR DISC SURGERY  1970's  . LYMPH NODE DISSECTION  2005   "all my lymph nodes under breasts, went thru my back; Dr. Arlyce Dice did it"  . Needle aspiration Pelvic cyst  2011   Dr Diona Fanti  . VIDEO ASSISTED THORACOSCOPY (VATS)/THOROCOTOMY Right 09/2003   thoracotomy, right upper lobectomy with node dissection; Dr Demetrios Loll 11/02/2010  . VIDEO BRONCHOSCOPY  03/2004   Archie Endo 11/02/2010    Social History   Social History  . Marital status: Married    Spouse name: N/A  . Number of children: N/A  . Years of education: N/A   Occupational History  . Retired Animator, School system Retired   Social History Main Topics  . Smoking status: Former Smoker    Packs/day: 1.00    Years: 42.00    Types: Cigarettes    Quit date: 09/22/2003  . Smokeless tobacco: Never Used  . Alcohol use 8.4 oz/week    14 Glasses of wine per week     Comment: 12/22/2014 "I have a large glass of wine qd"  . Drug use: No  . Sexual activity: No   Other Topics Concern  . Not on file   Social History Narrative  . No narrative on file    Family History  Problem Relation Age of Onset  . Heart disease Father   . Rheumatologic disease Brother   . Cancer Brother        Leukemia  . Rheumatologic disease Sister     Review of Systems  Constitutional: Negative for chills and fever.  Eyes: Negative for visual disturbance.  Respiratory: Positive for cough (chronic), shortness of breath (chronic) and wheezing (chronic).   Cardiovascular: Negative for chest pain, palpitations and leg swelling.  Gastrointestinal: Positive for abdominal pain. Negative for blood in stool, constipation and diarrhea.       Gerd occasionally  Genitourinary: Positive for dysuria and frequency. Negative for hematuria.  Musculoskeletal: Positive for arthralgias and back pain.  Skin:       Lumps near wrists  Neurological: Positive for light-headedness (if oxygen too low). Negative for headaches.  Hematological:  Bruises/bleeds easily.  Psychiatric/Behavioral: Positive for dysphoric mood (mild, related to health problems). The patient is nervous/anxious.        Objective:   Vitals:   12/09/16 1053  BP: (!) 152/82  Pulse: 83  Resp: 18  Temp: 98.1 F (36.7 C)   Filed Weights   12/09/16 1053  Weight: 150 lb (68 kg)   Body mass index is 24.96 kg/m.  Wt Readings from Last 3 Encounters:  12/09/16 150 lb (68 kg)  10/27/16 154 lb (69.9 kg)  08/11/16 149 lb 6.4 oz (67.8 kg)  Physical Exam Constitutional: She appears well-developed and well-nourished. No distress.  HENT:  Head: Normocephalic and atraumatic.  Right Ear: External ear normal. Normal ear canal and TM Left Ear: External ear normal.  Normal ear canal and TM Mouth/Throat: Oropharynx is clear and moist.  Eyes: Conjunctivae and EOM are normal.  Neck: Neck supple. No tracheal deviation present. No thyromegaly present.  No carotid bruit  Cardiovascular: Normal rate, regular rhythm and normal heart sounds.   No murmur heard.  No edema. Pulmonary/Chest: Effort normal and breath sounds normal. No respiratory distress. She has no wheezes. She has no rales.  Breast: deferred Abdominal: Soft. She exhibits no distension. There is no tenderness.  Lymphadenopathy: She has no cervical adenopathy.  Skin: Skin is warm and dry. She is not diaphoretic.  Psychiatric: She has a normal mood and affect. Her behavior is normal.       Assessment & Plan:   Physical exam: Screening blood work    ordered Immunizations   Up to date, discussed shingrix Colonoscopy  - no longer needed due to age 27  - no longer needed due to age Dexa       Due  - will order Eye exams  Up to date - due now - will schedule EKG    Done 11/2015 Exercise  - none due to DOE with chronic respiratory Weight  Good for age Skin  Has some lumps near wrists - mobile - likely cysts/ lipomas Substance abuse  See Problem List for Assessment and Plan of chronic  medical problems.   FU in 6 months

## 2016-12-08 NOTE — Assessment & Plan Note (Addendum)
Check a1c Low sugar / carb diet

## 2016-12-09 ENCOUNTER — Ambulatory Visit (INDEPENDENT_AMBULATORY_CARE_PROVIDER_SITE_OTHER): Payer: PPO | Admitting: Internal Medicine

## 2016-12-09 ENCOUNTER — Other Ambulatory Visit (INDEPENDENT_AMBULATORY_CARE_PROVIDER_SITE_OTHER): Payer: PPO

## 2016-12-09 ENCOUNTER — Encounter: Payer: Self-pay | Admitting: Internal Medicine

## 2016-12-09 VITALS — BP 152/82 | HR 83 | Temp 98.1°F | Resp 18 | Ht 65.0 in | Wt 150.0 lb

## 2016-12-09 DIAGNOSIS — R300 Dysuria: Secondary | ICD-10-CM

## 2016-12-09 DIAGNOSIS — R739 Hyperglycemia, unspecified: Secondary | ICD-10-CM

## 2016-12-09 DIAGNOSIS — E2839 Other primary ovarian failure: Secondary | ICD-10-CM | POA: Diagnosis not present

## 2016-12-09 DIAGNOSIS — Z0001 Encounter for general adult medical examination with abnormal findings: Secondary | ICD-10-CM

## 2016-12-09 DIAGNOSIS — F419 Anxiety disorder, unspecified: Secondary | ICD-10-CM | POA: Diagnosis not present

## 2016-12-09 DIAGNOSIS — E038 Other specified hypothyroidism: Secondary | ICD-10-CM

## 2016-12-09 DIAGNOSIS — Z13820 Encounter for screening for osteoporosis: Secondary | ICD-10-CM

## 2016-12-09 DIAGNOSIS — Z0000 Encounter for general adult medical examination without abnormal findings: Secondary | ICD-10-CM | POA: Diagnosis not present

## 2016-12-09 DIAGNOSIS — E785 Hyperlipidemia, unspecified: Secondary | ICD-10-CM

## 2016-12-09 LAB — HEMOGLOBIN A1C: Hgb A1c MFr Bld: 5.5 % (ref 4.6–6.5)

## 2016-12-09 LAB — URINALYSIS, ROUTINE W REFLEX MICROSCOPIC
Bilirubin Urine: NEGATIVE
Hgb urine dipstick: NEGATIVE
Ketones, ur: NEGATIVE
Leukocytes, UA: NEGATIVE
Nitrite: NEGATIVE
PH: 6.5 (ref 5.0–8.0)
RBC / HPF: NONE SEEN (ref 0–?)
Specific Gravity, Urine: <=1.005 — AB (ref 1.000–1.030)
Total Protein, Urine: NEGATIVE
Urine Glucose: NEGATIVE
Urobilinogen, UA: 0.2 (ref 0.0–1.0)
WBC, UA: NONE SEEN (ref 0–?)

## 2016-12-09 LAB — LIPID PANEL
CHOL/HDL RATIO: 2
Cholesterol: 194 mg/dL (ref 0–200)
HDL: 98.3 mg/dL (ref >39.00)
LDL CALC: 81 mg/dL (ref 0–99)
NONHDL: 95.8
TRIGLYCERIDES: 72 mg/dL (ref 0.0–149.0)
VLDL: 14.4 mg/dL (ref 0.0–40.0)

## 2016-12-09 LAB — CBC WITH DIFFERENTIAL/PLATELET
BASOS ABS: 0.1 10*3/uL (ref 0.0–0.1)
Basophils Relative: 0.6 % (ref 0.0–3.0)
Eosinophils Absolute: 0 10*3/uL (ref 0.0–0.7)
Eosinophils Relative: 0 % (ref 0.0–5.0)
HCT: 36.9 % (ref 36.0–46.0)
Hemoglobin: 12.4 g/dL (ref 12.0–15.0)
LYMPHS ABS: 1.2 10*3/uL (ref 0.7–4.0)
Lymphocytes Relative: 10.7 % — ABNORMAL LOW (ref 12.0–46.0)
MCHC: 33.7 g/dL (ref 30.0–36.0)
MCV: 93.8 fl (ref 78.0–100.0)
MONO ABS: 0.7 10*3/uL (ref 0.1–1.0)
MONOS PCT: 6.6 % (ref 3.0–12.0)
NEUTROS ABS: 9 10*3/uL — AB (ref 1.4–7.7)
NEUTROS PCT: 82.1 % — AB (ref 43.0–77.0)
PLATELETS: 348 10*3/uL (ref 150.0–400.0)
RBC: 3.93 Mil/uL (ref 3.87–5.11)
RDW: 14 % (ref 11.5–15.5)
WBC: 11 10*3/uL — ABNORMAL HIGH (ref 4.0–10.5)

## 2016-12-09 LAB — COMPREHENSIVE METABOLIC PANEL
ALT: 15 U/L (ref 0–35)
AST: 20 U/L (ref 0–37)
Albumin: 4 g/dL (ref 3.5–5.2)
Alkaline Phosphatase: 102 U/L (ref 39–117)
BUN: 13 mg/dL (ref 6–23)
CO2: 36 meq/L — AB (ref 19–32)
Calcium: 10.4 mg/dL (ref 8.4–10.5)
Chloride: 93 mEq/L — ABNORMAL LOW (ref 96–112)
Creatinine, Ser: 0.79 mg/dL (ref 0.40–1.20)
GFR: 74.12 mL/min (ref >60.00)
GLUCOSE: 110 mg/dL — AB (ref 70–99)
POTASSIUM: 4.9 meq/L (ref 3.5–5.1)
SODIUM: 135 meq/L (ref 135–145)
Total Bilirubin: 0.5 mg/dL (ref 0.2–1.2)
Total Protein: 7.2 g/dL (ref 6.0–8.3)

## 2016-12-09 LAB — TSH: TSH: 0.1 u[IU]/mL — ABNORMAL LOW (ref 0.35–4.50)

## 2016-12-09 MED ORDER — ALBUTEROL SULFATE HFA 108 (90 BASE) MCG/ACT IN AERS: 2.0000 | INHALATION_SPRAY | Freq: Four times a day (QID) | RESPIRATORY_TRACT | 12 refills | Status: DC | PRN

## 2016-12-09 NOTE — Assessment & Plan Note (Signed)
Takes xanax nightly - unable to sleep without it Has tried several other medications  - they were not effective Continue

## 2016-12-09 NOTE — Assessment & Plan Note (Signed)
Has had some dysuria Will check UA, UCx

## 2016-12-10 LAB — URINE CULTURE

## 2016-12-12 ENCOUNTER — Other Ambulatory Visit: Payer: Self-pay | Admitting: Emergency Medicine

## 2016-12-12 ENCOUNTER — Encounter: Payer: Self-pay | Admitting: Emergency Medicine

## 2016-12-12 DIAGNOSIS — E038 Other specified hypothyroidism: Secondary | ICD-10-CM

## 2016-12-12 MED ORDER — LEVOTHYROXINE SODIUM 88 MCG PO TABS: 88.0000 ug | ORAL_TABLET | Freq: Every day | ORAL | 0 refills | Status: DC

## 2016-12-15 DIAGNOSIS — J9611 Chronic respiratory failure with hypoxia: Secondary | ICD-10-CM | POA: Diagnosis not present

## 2016-12-15 DIAGNOSIS — J449 Chronic obstructive pulmonary disease, unspecified: Secondary | ICD-10-CM | POA: Diagnosis not present

## 2016-12-16 ENCOUNTER — Other Ambulatory Visit: Payer: Self-pay | Admitting: Internal Medicine

## 2016-12-16 NOTE — Telephone Encounter (Signed)
[place] controlled substance database checked.  Ok to fill medication.  

## 2016-12-17 IMAGING — DX DG CHEST 2V
2 series · 2 of 2 positions shown · non-contrast
Comparison: Chest x-ray 03/05/2015, 12/22/2014, 06/30/2014 CT
10/14/2014, 07/10/2014.

ADDENDUM:
Impression number 2 should read-- Stable right mid lung field tiny
pulmonary nodule.
CLINICAL DATA: Shortness of breath.

EXAM:
CHEST  2 VIEW

[chest pa]
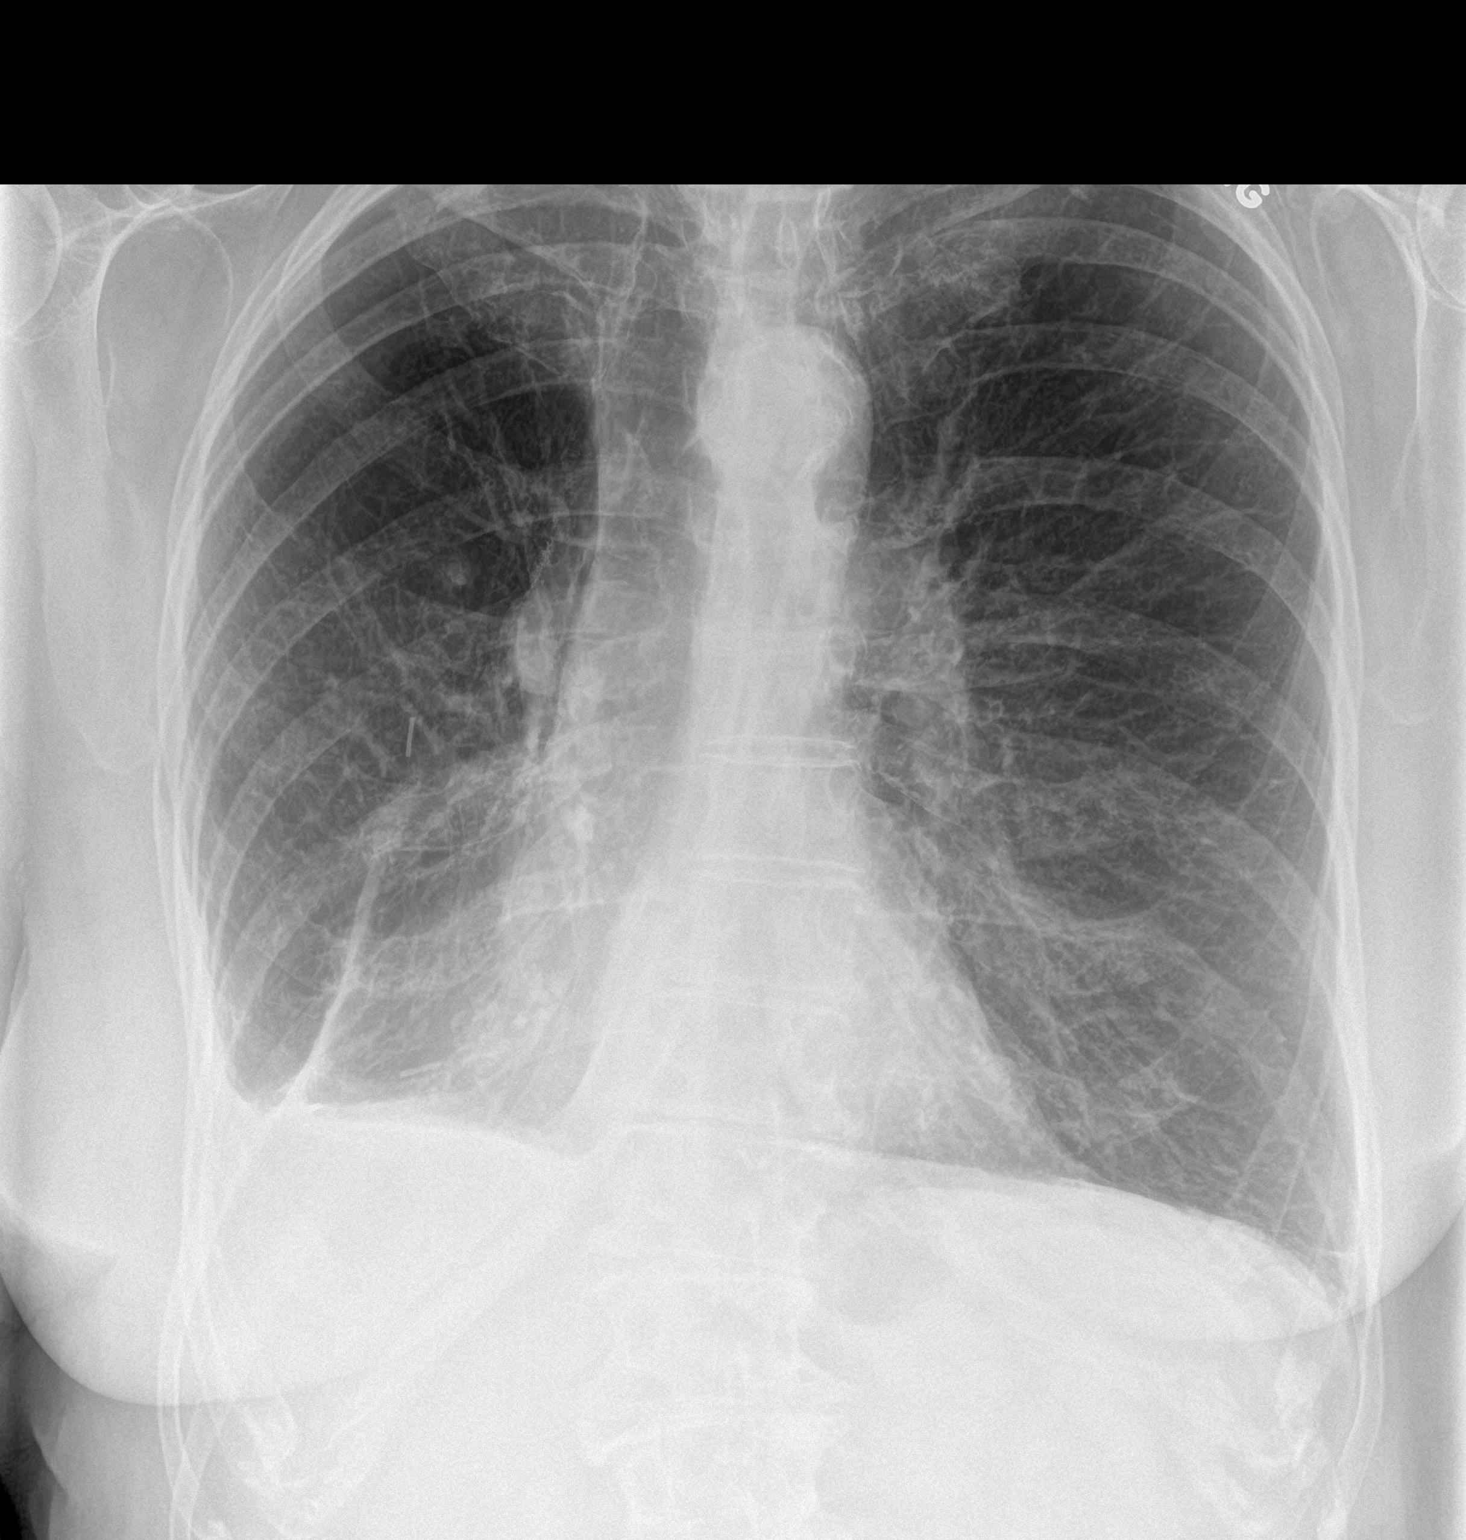

[chest lat]
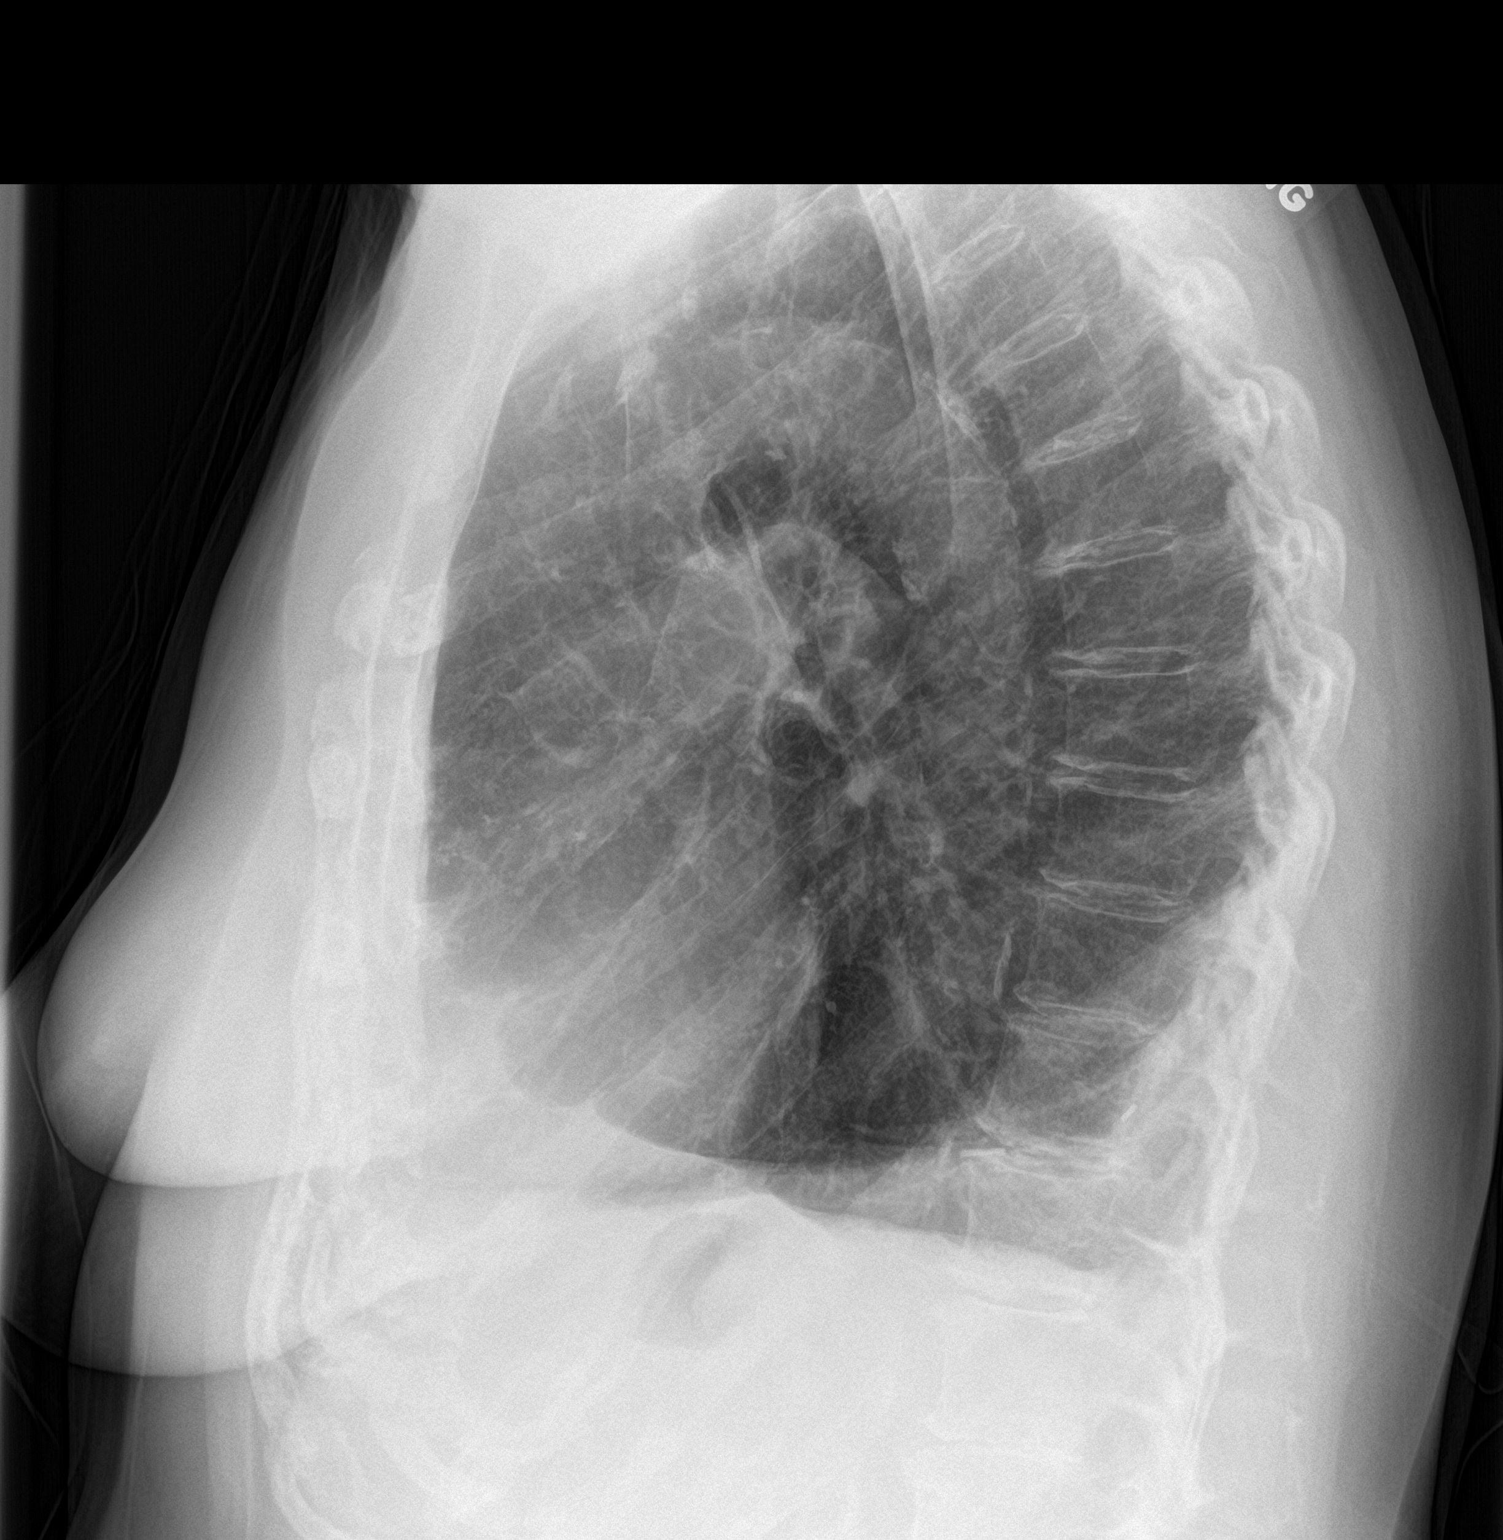

[2 of 2 positions shown; findings below may reference images not displayed]

FINDINGS: Mediastinum and hilar structures are normal. Stable cardiomegaly.
Stable right base postsurgical change with pleural parenchymal
thickening consistent with scarring. Stable right mid lung field
pulmonary nodule. Diffuse degenerative changes thoracic spine.
IMPRESSION: 1. Stable right base postsurgical change with pleural parenchymal
scarring.
2. Stable right mid lung field tiny will low scrotal or
retro-orbital are all in all are right pelvic wall by pulmonary
nodule.

## 2016-12-19 NOTE — Telephone Encounter (Signed)
Called [facility]ill [facility] had to leave on pharmacy vm../lmb 

## 2016-12-20 ENCOUNTER — Telehealth: Payer: Self-pay | Admitting: Internal Medicine

## 2016-12-20 NOTE — Telephone Encounter (Signed)
Does this happen every time? If so, how long does it last, does she vomit? and does she have other symptoms such as headache, chest pain, or palpitations from these treatments.  Suggest otc dramamine or meclizine trial if needed

## 2016-12-20 NOTE — Telephone Encounter (Signed)
CY  Spoke with the pt and she answered your questions:  Does this happen every time?No not every time  If so, how long does it last, does she vomit? Also added when this does happen no she does not vomit and does she have other symptoms such as headache, chest pain, or palpitations from these treatments? And she stated no to all these symptoms as well as no fever.  She states she will try the otc medication and see if this helps

## 2016-12-20 NOTE — Telephone Encounter (Signed)
CY  Please Advise-  Pt called in and stated that after giving herself a breathing treatment she feels nauseous. She wanted to know if you could prescribe something for this.

## 2016-12-22 ENCOUNTER — Telehealth: Payer: Self-pay | Admitting: Internal Medicine

## 2016-12-22 NOTE — Telephone Encounter (Signed)
Pt called stating that her WBC was elevated. She said that she is very nauseous through out the day and has a low grade fever. She is wanting to know if this could be the reason for the increased WBC. She is thinking it could be coming from her lungs or diverticulitis? It has been going on for 2 or 3 days but today has been much worse.

## 2016-12-22 NOTE — Telephone Encounter (Signed)
An elevated WBC can indicate an infection.  If her symptoms are not improving she needs to be seen to be evaluated.

## 2016-12-23 NOTE — Telephone Encounter (Signed)
Advised patient of dr burns note, patient states she is feeling better today, no more nausea or low grade fever---she will call back and schedule office visit if symptoms reoccur

## 2016-12-25 ENCOUNTER — Other Ambulatory Visit: Payer: Self-pay | Admitting: Internal Medicine

## 2016-12-26 ENCOUNTER — Other Ambulatory Visit (INDEPENDENT_AMBULATORY_CARE_PROVIDER_SITE_OTHER): Payer: PPO

## 2016-12-26 ENCOUNTER — Ambulatory Visit (INDEPENDENT_AMBULATORY_CARE_PROVIDER_SITE_OTHER)
Admission: RE | Admit: 2016-12-26 | Discharge: 2016-12-26 | Disposition: A | Discharge status: Home / Self Care | Payer: PPO | Source: Ambulatory Visit | Attending: Internal Medicine | Admitting: Internal Medicine

## 2016-12-26 ENCOUNTER — Telehealth: Payer: Self-pay | Admitting: Internal Medicine

## 2016-12-26 DIAGNOSIS — R509 Fever, unspecified: Secondary | ICD-10-CM | POA: Diagnosis not present

## 2016-12-26 DIAGNOSIS — R0602 Shortness of breath: Secondary | ICD-10-CM | POA: Diagnosis not present

## 2016-12-26 LAB — CBC WITH DIFFERENTIAL/PLATELET
BASOS ABS: 0.1 10*3/uL (ref 0.0–0.1)
Basophils Relative: 0.9 % (ref 0.0–3.0)
EOS ABS: 0.1 10*3/uL (ref 0.0–0.7)
Eosinophils Relative: 1.5 % (ref 0.0–5.0)
HEMATOCRIT: 37.6 % (ref 36.0–46.0)
HEMOGLOBIN: 12.6 g/dL (ref 12.0–15.0)
LYMPHS PCT: 21.1 % (ref 12.0–46.0)
Lymphs Abs: 1.5 10*3/uL (ref 0.7–4.0)
MCHC: 33.6 g/dL (ref 30.0–36.0)
MCV: 93.7 fl (ref 78.0–100.0)
Monocytes Absolute: 1.1 10*3/uL — ABNORMAL HIGH (ref 0.1–1.0)
Monocytes Relative: 14.6 % — ABNORMAL HIGH (ref 3.0–12.0)
NEUTROS ABS: 4.5 10*3/uL (ref 1.4–7.7)
Neutrophils Relative %: 61.9 % (ref 43.0–77.0)
PLATELETS: 242 10*3/uL (ref 150.0–400.0)
RBC: 4.02 Mil/uL (ref 3.87–5.11)
RDW: 14 % (ref 11.5–15.5)
WBC: 7.2 10*3/uL (ref 4.0–10.5)

## 2016-12-26 MED ORDER — DOXYCYCLINE HYCLATE 100 MG PO TABS: ORAL_TABLET | ORAL | 0 refills | Status: DC

## 2016-12-26 NOTE — Telephone Encounter (Signed)
Suggest- outpatient CXR and CBC w diff     Dx fever  Offer doxycycline 100 mg, # 8, 2 today then one daily

## 2016-12-26 NOTE — Telephone Encounter (Signed)
Spoke with patient. She stated that she has been running a low grade fever since last Thursday up until yesterday. The highest it was got to was 100.6F. She also has been feeling nauseous. She has been taking OTC Tylenol for the fever. She did state that she had a CPE with Dr. Quay Burow on 12/09/16 and she was told that her WBC was elevated. She has a productive cough with clear phlegm. She stated that she feels like she is not getting enough air into her lungs.   She wants to know if CY can see her today. Advised that his schedule was completely booked. [facility] [address]   CY, please advise.

## 2016-12-26 NOTE — Telephone Encounter (Signed)
Pt is aware of CY's recommendations and voiced her understanding. CXR and CBC w/ diff has been ordered. Rx has been sent to preferred pharmacy. Pt would like to know if CY thinks she should keep her acute OV for 12/27/16.  CY please advise. Thanks.

## 2016-12-26 NOTE — Telephone Encounter (Signed)
Maybe keep it till tomorrow morning, then please call us and cancel if feeling better.

## 2016-12-26 NOTE — Telephone Encounter (Signed)
Spoke with patient. She is aware of CY's recs. Nothing further needed at time of call.

## 2016-12-27 ENCOUNTER — Ambulatory Visit (INDEPENDENT_AMBULATORY_CARE_PROVIDER_SITE_OTHER): Payer: PPO | Admitting: Internal Medicine

## 2016-12-27 ENCOUNTER — Encounter: Payer: Self-pay | Admitting: Internal Medicine

## 2016-12-27 VITALS — BP 114/70 | HR 96 | Temp 98.5°F | Ht 66.0 in | Wt 150.0 lb

## 2016-12-27 DIAGNOSIS — J441 Chronic obstructive pulmonary disease with (acute) exacerbation: Secondary | ICD-10-CM | POA: Diagnosis not present

## 2016-12-27 DIAGNOSIS — J449 Chronic obstructive pulmonary disease, unspecified: Secondary | ICD-10-CM | POA: Diagnosis not present

## 2016-12-27 DIAGNOSIS — J9611 Chronic respiratory failure with hypoxia: Secondary | ICD-10-CM | POA: Diagnosis not present

## 2016-12-27 MED ORDER — LEVALBUTEROL HCL 0.63 MG/3ML IN NEBU
0.6300 mg | INHALATION_SOLUTION | Freq: Once | RESPIRATORY_TRACT | Status: AC
  Administered 2016-12-27: 0.63 mg via RESPIRATORY_TRACT

## 2016-12-27 MED ORDER — METHYLPREDNISOLONE ACETATE 80 MG/ML IJ SUSP
80.0000 mg | Freq: Once | INTRAMUSCULAR | Status: AC
  Administered 2016-12-27: 80 mg via INTRAMUSCULAR

## 2016-12-27 NOTE — Progress Notes (Signed)
Patient ID: [name], female    DOB: 11-Jan-1933, 83 y.o.   MRN: [id number]  HPI female former smoker followed for history of right upper lobe resection/NSSCA, COPD, chronic hypoxic respiratory failure, allergic rhinitis, complicated by glaucoma Oximetry walk test 07/06/2015-desaturation to 85% while walking on room air. Saturation sustained at 97% on 2 L prongs  ----------------------------------------------------------------------------- .  08/11/2016- 83 year old female former smoker followed for history of right upper lobe resection/NSSCA, COPD, chronic hypoxic respiratory failure, allergic rhinitis, complicated by glaucoma O2 3 L/[employer] FOLLOWS FOR:Pt continues to wear O2 QHS and exertion. Pt gives out quickly-unable to do many chores due to SOB. Husband with her doesn't indicate a dramatic change. She implies exertional dyspnea is probably near her baseline. Does note increased cough and rattle in the last few days with clear/white sputum, no fever or blood. Xopenex nebulizer helps when needed but makes jittery.  12/27/16- 83 year old female former smoker followed for history of right upper lobe resection/NSSCA, COPD, chronic hypoxic respiratory failure, allergic rhinitis, complicated by glaucoma O2 3 L/[employer] ACUTE VISIT: Pt has had low grade fevers in the afternoons mainly x 1 week and cough-productive (slight) white and thick.  Patient called [place] July 3 saying that she noticed nausea after nebulizer treatment. We suggested meclizine. She called yesterday complaining of fever. Chest x-ray and CBC were ordered, doxycycline sent. She keeps appointment today to return for follow-up. Describes rattling cough but not as productive as usual. When this happens chest begins to get uncomfortable. Using her nebulizer twice daily does help. Took two doxycycline to start yesterday, felt nauseated and took Dramamine for relief. Denies abdominal pain or change in bowel habit CBC 12/26/16-WBC  7200, slight elevation of monocytes. WBC was 11,000 2 weeks ago CXR 12/26/16- IMPRESSION: No acute disease.  ROS-see HPI  "+" = pos Constitutional:    weight loss, night sweats, fevers, chills, fatigue, lassitude. HEENT:    headaches, difficulty swallowing, tooth/dental problems, sore throat,       sneezing, itching, ear ache, nasal congestion, post nasal drip, snoring CV:     chest pain, orthopnea, PND, swelling in lower extremities, anasarca,                                                        dizziness, palpitations Resp:   + shortness of breath with exertion or at rest.               + productive cough,   non-productive cough, coughing up of blood.              change in color of mucus.  wheezing.   Skin:    rash or lesions. GI:  No-   heartburn, indigestion, abdominal pain, nausea, vomiting,  GU:  MS:   joint pain, stiffness, . Neuro-     nothing unusual Psych:  change in mood or affect.  depression or anxiety.   memory loss.   Objective:   OBJ- Physical Exam General- Alert, Oriented, Affect-appropriate, Distress- none acute, + Wearing O2 3 L pulse Skin- rash-none, lesions- none, excoriation- none Lymphadenopathy- none Head- atraumatic            Eyes- Gross vision intact, PERRLA, conjunctivae and secretions clear            Ears- Hearing, canals-normal  Nose- Clear, no-Septal dev, mucus, polyps, erosion, perforation             Throat- Mallampati II , mucosa clear , drainage- none, tonsils- atrophic Neck- flexible , trachea midline, no stridor , thyroid nl, carotid no bruit Chest - symmetrical excursion , unlabored           Heart/CV- RRR , no murmur , no gallop  , no rub, nl s1 s2                           - JVD- none , edema- none, stasis changes- none, varices- none           Lung-  + Bilateral mild rattle, cough + , dullness-none, rub- none, + wheeze           Chest wall- + remote right thoracotomy for right  lobectomy Abd-  Br/ Gen/ Rectal- Not done, not  indicated Extrem- cyanosis- none, clubbing, none, atrophy- none, strength- nl, +Wheelchair Neuro- grossly intact to observation

## 2016-12-27 NOTE — Assessment & Plan Note (Signed)
Acute exacerbation, possibly viral. This does seem to be primarily a respiratory illness. Plan nebulizer treatments Xopenex, Depo-Medrol, stay hydrated, try to finish the doxycycline and continue routine meds

## 2016-12-27 NOTE — Assessment & Plan Note (Signed)
She remains dependent on oxygen

## 2016-12-27 NOTE — Patient Instructions (Addendum)
Neb xop 0.63    Dx acute exacerbation of COPD  Depo 80  Try to get liquids in if you can, and try to finish the doxycycline  Please call as needed  Ok to keep appointment in August

## 2017-01-14 DIAGNOSIS — J9611 Chronic respiratory failure with hypoxia: Secondary | ICD-10-CM | POA: Diagnosis not present

## 2017-01-14 DIAGNOSIS — J449 Chronic obstructive pulmonary disease, unspecified: Secondary | ICD-10-CM | POA: Diagnosis not present

## 2017-02-03 ENCOUNTER — Other Ambulatory Visit: Payer: Self-pay | Admitting: Internal Medicine

## 2017-02-09 ENCOUNTER — Encounter: Payer: Self-pay | Admitting: Internal Medicine

## 2017-02-09 ENCOUNTER — Ambulatory Visit (INDEPENDENT_AMBULATORY_CARE_PROVIDER_SITE_OTHER)
Admission: RE | Admit: 2017-02-09 | Discharge: 2017-02-09 | Disposition: A | Discharge status: Home / Self Care | Payer: PPO | Source: Ambulatory Visit | Attending: Internal Medicine | Admitting: Internal Medicine

## 2017-02-09 ENCOUNTER — Ambulatory Visit (INDEPENDENT_AMBULATORY_CARE_PROVIDER_SITE_OTHER): Payer: PPO | Admitting: Internal Medicine

## 2017-02-09 ENCOUNTER — Other Ambulatory Visit: Payer: PPO

## 2017-02-09 VITALS — BP 112/76 | HR 77 | Ht 66.0 in | Wt 146.6 lb

## 2017-02-09 DIAGNOSIS — R918 Other nonspecific abnormal finding of lung field: Secondary | ICD-10-CM | POA: Diagnosis not present

## 2017-02-09 DIAGNOSIS — J449 Chronic obstructive pulmonary disease, unspecified: Secondary | ICD-10-CM | POA: Diagnosis not present

## 2017-02-09 DIAGNOSIS — J479 Bronchiectasis, uncomplicated: Secondary | ICD-10-CM | POA: Diagnosis not present

## 2017-02-09 DIAGNOSIS — Z13820 Encounter for screening for osteoporosis: Secondary | ICD-10-CM

## 2017-02-09 DIAGNOSIS — Z23 Encounter for immunization: Secondary | ICD-10-CM | POA: Diagnosis not present

## 2017-02-09 DIAGNOSIS — E2839 Other primary ovarian failure: Secondary | ICD-10-CM

## 2017-02-09 DIAGNOSIS — J9611 Chronic respiratory failure with hypoxia: Secondary | ICD-10-CM

## 2017-02-09 NOTE — Patient Instructions (Addendum)
Order-  Schedule CT chest  No contrast    Dx lung nodules, bronchiectasis  Flu vax  We can continue oxygen and your breathing medicines

## 2017-02-09 NOTE — Progress Notes (Signed)
Patient ID: [name], female    DOB: 05/19/1933, 83 y.o.   MRN: [id number]  HPI female former smoker followed for history of right upper lobe resection/NSSCA, COPD, chronic hypoxic respiratory failure, allergic rhinitis, complicated by glaucoma Oximetry walk test 07/06/2015-desaturation to 85% while walking on room air. Saturation sustained at 97% on 2 L prongs  ----------------------------------------------------------------------------- .  12/27/16- 83 year old female former smoker followed for history of right upper lobe resection/NSSCA, COPD, chronic hypoxic respiratory failure, allergic rhinitis, complicated by glaucoma O2 3 L/[facility] ACUTE VISIT: Pt has had low grade fevers in the afternoons mainly x 1 week and cough-productive (slight) white and thick.  Patient called [place] July 3 saying that she noticed nausea after nebulizer treatment. We suggested meclizine. She called yesterday complaining of fever. Chest x-ray and CBC were ordered, doxycycline sent. She keeps appointment today to return for follow-up. Describes rattling cough but not as productive as usual. When this happens chest begins to get uncomfortable. Using her nebulizer twice daily does help. Took two doxycycline to start yesterday, felt nauseated and took Dramamine for relief. Denies abdominal pain or change in bowel habit CBC 12/26/16-WBC 7200, slight elevation of monocytes. WBC was 11,000 2 weeks ago CXR 12/26/16- IMPRESSION: No acute disease.  02/09/17- 83 year old female former smoker followed for history of right upper lobe resection/NSSCA, COPD, chronic hypoxic respiratory failure, allergic rhinitis, complicated by glaucoma O2 3 L/[employer]  requalified today Activity limited by hip pain. Requests flu shot. FOLLOWS FOR:Pt continues to wear O2 24/7. Doing pretty good with her breathing however she is having increased hip pain.  Albuterol HFA and Flovent HFA. Uses Xopenex by nebulizer. She says her breathing is good.  She needed to be brought up in a wheelchair because of hip pain. Occasional coughing spells, usually dry. She still feels her tendency to retain secretions unless she works to cough her airways clear. Flutter device helps only "some". Nebulizer treatments help. No recent fever or purulent sputum. CXR 12/26/16 IMPRESSION: No acute disease. Postoperative change right chest. COPD. Atherosclerosis.  ROS-see HPI  "+" = pos Constitutional:    weight loss, night sweats, fevers, chills, fatigue, lassitude. HEENT:    headaches, difficulty swallowing, tooth/dental problems, sore throat,       sneezing, itching, ear ache, nasal congestion, post nasal drip, snoring CV:     chest pain, orthopnea, PND, swelling in lower extremities, anasarca,                                                        dizziness, palpitations Resp:   + shortness of breath with exertion or at rest.               + productive cough,   non-productive cough, coughing up of blood.              change in color of mucus.  wheezing.   Skin:    rash or lesions. GI:  No-   heartburn, indigestion, abdominal pain, nausea, vomiting,  GU:  MS:   + joint pain, stiffness, . Neuro-     nothing unusual Psych:  change in mood or affect.  depression or anxiety.   memory loss.   Objective:   OBJ- Physical Exam General- Alert, Oriented, Affect-appropriate, Distress- none acute, + Wearing O2 3 L pulse Skin- rash-none,  lesions- none, excoriation- none Lymphadenopathy- none Head- atraumatic            Eyes- Gross vision intact, PERRLA, conjunctivae and secretions clear            Ears- Hearing, canals-normal            Nose- Clear, no-Septal dev, mucus, polyps, erosion, perforation             Throat- Mallampati II , mucosa clear , drainage- none, tonsils- atrophic Neck- flexible , trachea midline, no stridor , thyroid nl, carotid no bruit Chest - symmetrical excursion , unlabored           Heart/CV- RRR , no murmur , no gallop  , no rub,  nl s1 s2                           - JVD- none , edema- none, stasis changes- none, varices- none           Lung-  Chest clear-no rhonchi, cough -none , dullness-none, rub- none,  wheeze-none           Chest wall- + remote right thoracotomy for right  lobectomy Abd-  Br/ Gen/ Rectal- Not done, not indicated Extrem- cyanosis- none, clubbing, none, atrophy- none, strength- nl, +Wheelchair Neuro- grossly intact to observation

## 2017-02-12 NOTE — Assessment & Plan Note (Signed)
Pulmonary toilet/secretion clearance is a concern to her. Flutter device has not been sufficient. She states she gets very uncomfortable if she can't cough productively at least once daily.  Plan-CT scan of chest, looking for bronchiectasis that might justify a trial of pneumatic vest for pulmonary toilet. Flu vaccine

## 2017-02-12 NOTE — Assessment & Plan Note (Signed)
She remains dependent on oxygen at 3 L. Activity is quite limited and she does not out run this flow rate

## 2017-02-14 DIAGNOSIS — J449 Chronic obstructive pulmonary disease, unspecified: Secondary | ICD-10-CM | POA: Diagnosis not present

## 2017-02-14 DIAGNOSIS — J9611 Chronic respiratory failure with hypoxia: Secondary | ICD-10-CM | POA: Diagnosis not present

## 2017-02-16 ENCOUNTER — Encounter: Payer: Self-pay | Admitting: Internal Medicine

## 2017-02-16 DIAGNOSIS — M8580 Other specified disorders of bone density and structure, unspecified site: Secondary | ICD-10-CM | POA: Insufficient documentation

## 2017-02-17 ENCOUNTER — Other Ambulatory Visit: Payer: Self-pay | Admitting: Internal Medicine

## 2017-02-17 NOTE — Telephone Encounter (Signed)
[place] Controlled Substance Database checked. Last filled on 01/18/17

## 2017-02-17 NOTE — Telephone Encounter (Signed)
Ok to fill 

## 2017-02-22 ENCOUNTER — Telehealth: Payer: Self-pay | Admitting: Internal Medicine

## 2017-02-22 NOTE — Telephone Encounter (Signed)
Spoke with pt to inform. Appt changed to AM appt so pt could be fasting.

## 2017-02-22 NOTE — Telephone Encounter (Signed)
She can try it but part of the reason her cholesterol looks so good is the medication and taking it daily - we can try it and recheck her cholesterol in a couple of month to se if it is still controlled.

## 2017-02-22 NOTE — Telephone Encounter (Signed)
Pt called asking if she could take her atorvastatin (LIPITOR) 20 MG tablet  Every other day since at her last visit her cholesterol was good. Please advise and call back  And if you would like I can call her back to have her come in to get her TSH rechecked.

## 2017-02-24 ENCOUNTER — Other Ambulatory Visit: Payer: PPO

## 2017-02-28 ENCOUNTER — Ambulatory Visit (INDEPENDENT_AMBULATORY_CARE_PROVIDER_SITE_OTHER)
Admission: RE | Admit: 2017-02-28 | Discharge: 2017-02-28 | Disposition: A | Discharge status: Home / Self Care | Payer: PPO | Source: Ambulatory Visit | Attending: Internal Medicine | Admitting: Internal Medicine

## 2017-02-28 DIAGNOSIS — R918 Other nonspecific abnormal finding of lung field: Secondary | ICD-10-CM | POA: Diagnosis not present

## 2017-02-28 DIAGNOSIS — J479 Bronchiectasis, uncomplicated: Secondary | ICD-10-CM | POA: Diagnosis not present

## 2017-02-28 DIAGNOSIS — R05 Cough: Secondary | ICD-10-CM | POA: Diagnosis not present

## 2017-03-02 ENCOUNTER — Telehealth: Payer: Self-pay | Admitting: Internal Medicine

## 2017-03-02 NOTE — Telephone Encounter (Signed)
CY please advise on results from CT done on 02/28/2017. I will call pt to let him know. Thanks.

## 2017-03-02 NOTE — Telephone Encounter (Signed)
Patient is returning phone call.  °

## 2017-03-02 NOTE — Telephone Encounter (Signed)
We have called the radiologist to ask him to clarify his report. May not have anything before Monday.

## 2017-03-02 NOTE — Telephone Encounter (Signed)
Pt advised she will here from [place] on Monday possibly, we are waiting on the report. She understood and will awiat our return call.

## 2017-03-06 ENCOUNTER — Other Ambulatory Visit: Payer: Self-pay | Admitting: Internal Medicine

## 2017-03-06 DIAGNOSIS — J479 Bronchiectasis, uncomplicated: Secondary | ICD-10-CM

## 2017-03-09 ENCOUNTER — Other Ambulatory Visit: Payer: Self-pay | Admitting: Internal Medicine

## 2017-03-09 NOTE — Telephone Encounter (Signed)
Medication filled.  She is due for a repeat tsh which was ordered

## 2017-03-09 NOTE — Telephone Encounter (Signed)
Routing to dr burns, patient was seen in June/2018 for hypothyroidism---and 90 day supply of levothyroxine was sent, however, tsh lab in June was abnormal---another tsh order was entered, looks like you may have wanted patient to come back for lab??---are you ok with refill or do you need patient to come for labs first---please advise, I will let patient know, thanks

## 2017-03-17 DIAGNOSIS — J449 Chronic obstructive pulmonary disease, unspecified: Secondary | ICD-10-CM | POA: Diagnosis not present

## 2017-03-17 DIAGNOSIS — J9611 Chronic respiratory failure with hypoxia: Secondary | ICD-10-CM | POA: Diagnosis not present

## 2017-03-20 ENCOUNTER — Telehealth: Payer: Self-pay | Admitting: Internal Medicine

## 2017-03-20 NOTE — Telephone Encounter (Signed)
She can contact the provider of the vest for suggestions. I would skip it a couple of days for soreness to go down.  Also, make the sessions much shorter for now- maybe 10-15 minutes. Turn the pressure down some if able.

## 2017-03-20 NOTE — Telephone Encounter (Signed)
Spoke with pt,aware of recs.  Nothing further needed.  

## 2017-03-20 NOTE — Telephone Encounter (Signed)
Pt wore her vibratory vest for the first time on Friday- one session approx 30 minutes with [employer] rep.  States the vest was too tight and that a larger size is being brought to her tomorrow to try. Pt states that since wearing vest she is having a lot of soreness in ribs, unable to cough d/t the discomfort in ribs.  Pt has not taken anything to help with pain, stated that she would try taking tylenol to help while on the phone with me.  Pt is concerned that d/t her arthritis, age, and scar tissue from past lobectomy that she will not be able to tolerate wearing this vest.  Pt is requesting CY's recs regarding this issue.    CY please advise.  Thanks.

## 2017-03-29 ENCOUNTER — Ambulatory Visit (INDEPENDENT_AMBULATORY_CARE_PROVIDER_SITE_OTHER): Payer: PPO | Admitting: Family Medicine

## 2017-03-29 ENCOUNTER — Encounter: Payer: Self-pay | Admitting: Family Medicine

## 2017-03-29 DIAGNOSIS — M1711 Unilateral primary osteoarthritis, right knee: Secondary | ICD-10-CM

## 2017-03-29 MED ORDER — MELOXICAM 15 MG PO TABS: 15.0000 mg | ORAL_TABLET | Freq: Every day | ORAL | 3 refills | Status: DC

## 2017-03-29 NOTE — Progress Notes (Signed)
[name] [address], [place] [address] Phone: [phone] Subjective:     CC: Right leg pain  [id number]  BABARA BUFFALO is a 83 y.o. female coming in for right hip and knee pain. She has been having pain for a couple of months. She had an eipdural for her back which seemed to help the back but not the hip and knee. She said that the hip and knee are giving out on her. She is using a cane now. Patient was seen previously and diagnosed with arthritis previously. Patient is having worsening discomfort and pain at this time. States that most the pain seems to be more on the right knee. Sometimes feels unstable. Feels like she is going to fall on occasion. Patient rates the severity pain as 9 out of 10. States that all her pain seems to be worsening as well.    Past Medical History:  Diagnosis Date  . Allergic rhinitis   . Anxiety   . Arthritis    "back, hands; hips" (12/22/2014)  . Asthma   . Bladder atony   . Chronic bronchitis ([place])   . COPD (chronic obstructive pulmonary disease) ([place] [facility])   . Diarrhea   . Emphysema of lung ([facility])   . GERD (gastroesophageal reflux disease)    "w/spicey foods" (12/22/2014)  . Hyperlipidemia   . Non-small cell carcinoma of lung, stage 1 ([place]) 2005   1.4 cm Poorly differentiated Squamous cell RUL  T1N0 resected 09/22/2003  . On home oxygen therapy    "3L q night and prn during the day" (12/22/2014)  . Pelvic cyst   . Pneumonia    "this is the 3rd time that I can remember" (12/22/2014)  . Unspecified hypothyroidism    Past Surgical History:  Procedure Laterality Date  . ABDOMINAL HYSTERECTOMY  ~ 1976  . BACK SURGERY    . CATARACT EXTRACTION W/ INTRAOCULAR LENS  IMPLANT, BILATERAL Bilateral ~ 2012  . DILATION AND CURETTAGE OF UTERUS    . LUMBAR DISC SURGERY  1970's  . LYMPH NODE DISSECTION  2005   "all my lymph nodes under breasts, went thru my back; Dr. Arlyce Dice did it"  . Needle aspiration Pelvic cyst  2011     Dr Diona Fanti  . VIDEO ASSISTED THORACOSCOPY (VATS)/THOROCOTOMY Right 09/2003   thoracotomy, right upper lobectomy with node dissection; Dr Demetrios Loll 11/02/2010  . VIDEO BRONCHOSCOPY  03/2004   Archie Endo 11/02/2010   Social History   Social History  . Marital status: Married    Spouse name: N/A  . Number of children: N/A  . Years of education: N/A   Occupational History  . Retired Animator, School system Retired   Social History Main Topics  . Smoking status: Former Smoker    Packs/day: 1.00    Years: 42.00    Types: Cigarettes    Quit date: 09/22/2003  . Smokeless tobacco: Never Used  . Alcohol use 8.4 oz/week    14 Glasses of wine per week     Comment: 12/22/2014 "I have a large glass of wine qd"  . Drug use: No  . Sexual activity: No   Other Topics Concern  . None   Social History Narrative  . None   Allergies  Allergen Reactions  . Ampicillin Diarrhea and Nausea Only    GI upset  . Azithromycin Diarrhea and Nausea And Vomiting  . Codeine Nausea And Vomiting and Other (See Comments)    Makes her "crazy"  .  Daliresp [Roflumilast] Nausea Only  . Nitrofurantoin Diarrhea and Nausea Only  . Other Other (See Comments)    No nuts and seeds because of diverticulitis  . Penicillins Nausea And Vomiting, Swelling and Rash    Has patient had a PCN reaction causing immediate rash, facial/tongue/throat swelling, SOB or lightheadedness with hypotension: Yes Has patient had a PCN reaction causing severe rash involving mucus membranes or skin necrosis: No Has patient had a PCN reaction that required hospitalization No Has patient had a PCN reaction occurring within the last 10 years: No No "cillins" If all of the above answers are "NO", then may proceed with Cephalosporin use.   . Sulfonamide Derivatives Diarrhea and Nausea And Vomiting  . Tiotropium Bromide Monohydrate     Urinary retention  . Doxycycline Nausea Only  . Fludrocortisone Acetate Itching  . Lactose  Intolerance (Gi) Other (See Comments)    Gas and bloating   . Latex Itching and Rash  . Prednisone Other (See Comments)    Insomnia after 2 days   Family History  Problem Relation Age of Onset  . Heart disease Father   . Rheumatologic disease Brother   . Cancer Brother        Leukemia  . Rheumatologic disease Sister      Past medical history, social, surgical and family history all reviewed in electronic medical record.  No pertanent information unless stated regarding to the chief complaint.   Review of Systems:Review of systems updated and as accurate as of 03/29/17  No  visual changes, nausea, vomiting, diarrhea, constipation, dizziness, abdominal pain, skin rash, fevers, chills, night sweats, weight loss, swollen lymph nodes, chest pain, shortness of breath, mood changes. Positive muscle aches, body aches, headaches  Objective  Blood pressure 110/64, pulse 67, height 5\' 5"  (1.651 m), weight 148 lb (67.1 kg), SpO2 (!) 86 %. Systems examined below as of 03/29/17   General: No apparent distress alert and oriented x3 mood and affect normal, dressed appropriately. Wearing oxygen HEENT: Pupils equal, extraocular movements intact  Respiratory: Patient's speak in full sentences and does not appear short of breath wearing oxygen nasal cannula Cardiovascular: No lower extremity edema, non tender, no erythema  Skin: Warm dry intact with no signs of infection or rash on extremities or on axial skeleton.  Abdomen: Soft nontender  Neuro: Cranial nerves II through XII are intact, neurovascularly intact in all extremities with 2+ DTRs and 2+ pulses.  Lymph: No lymphadenopathy of posterior or anterior cervical chain or axillae bilaterally.  Gait Patient is in a wheelchair MSK:  tender with full range of motion and good stability and symmetric strength and tone of shoulders, elbows, wrist, hip and ankles bilaterally. Severe arthritic changes of multiple joints Knee: Right valgus deformity  noted.   Tender to palpation over medial and PF joint line. Seems to be out of proportion for the amount of palpation ROM full in flexion and extension and lower leg rotation. instability with valgus force.  painful patellar compression. Patellar glide with moderate crepitus. Patellar and quadriceps tendons unremarkable. Hamstring and quadriceps strength is normal. Contralateral knee shows arthritic changes with mild discomfort  After informed written and verbal consent, patient was seated on exam table. Right knee was prepped with alcohol swab and utilizing anterolateral approach, patient's right knee space was injected with 4:1  marcaine 0.5%: Kenalog 40mg /dL. Patient tolerated the procedure well without immediate complications.    Impression and Recommendations:     This case required medical decision making of moderate  complexity.      Note: This dictation was prepared with Dragon dictation along with smaller phrase technology. Any transcriptional errors that result from this process are unintentional.

## 2017-03-29 NOTE — Patient Instructions (Signed)
Good to see you  Meloxicam daily but watch for easy bruising or stomach pain then stop it immediately.  Injected the knee today  Stay active.  pennsaid pinkie amount topically 2 times daily as needed.   Try the brace  See me again in 4 weeks.

## 2017-03-29 NOTE — Assessment & Plan Note (Signed)
Patient given an injection today. Tolerated procedure well. We discussed icing regimen and home exercises. We discussed which activities to do in which ones to avoid. Patient is to increase activity slowly over the course the next several weeks. Patient was written for a walker instead of a cane and hopefully this will get her a little more mobile. Patient also given a brace for stability. Follow-up again in 4 weeks

## 2017-04-07 ENCOUNTER — Telehealth: Payer: Self-pay | Admitting: Internal Medicine

## 2017-04-07 ENCOUNTER — Telehealth: Payer: Self-pay | Admitting: Family Medicine
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
## 2017-04-07 NOTE — Telephone Encounter (Signed)
Error

## 2017-04-10 NOTE — Telephone Encounter (Signed)
Spoke with patient and she states that she picked up the meloxicam on Saturday. She took one pill but it made her fell nauseous so she has discontinued the medication and plans on using Tylenol arthritis instead. Also provided patient with medical supply phone number for another alternative to [facility] medical supply as she did not find what she was looking for there.

## 2017-04-16 DIAGNOSIS — J449 Chronic obstructive pulmonary disease, unspecified: Secondary | ICD-10-CM | POA: Diagnosis not present

## 2017-04-16 DIAGNOSIS — J9611 Chronic respiratory failure with hypoxia: Secondary | ICD-10-CM | POA: Diagnosis not present

## 2017-04-17 ENCOUNTER — Other Ambulatory Visit: Payer: Self-pay | Admitting: Internal Medicine

## 2017-04-17 DIAGNOSIS — J479 Bronchiectasis, uncomplicated: Secondary | ICD-10-CM | POA: Diagnosis not present

## 2017-04-17 NOTE — Telephone Encounter (Signed)
[place] Controlled Substance Database checked. Last filled on 03/18/17

## 2017-04-17 NOTE — Telephone Encounter (Signed)
RX faxed to [facility] 

## 2017-04-18 ENCOUNTER — Ambulatory Visit (INDEPENDENT_AMBULATORY_CARE_PROVIDER_SITE_OTHER): Payer: PPO | Admitting: Family Medicine

## 2017-04-18 ENCOUNTER — Encounter: Payer: Self-pay | Admitting: Family Medicine

## 2017-04-18 DIAGNOSIS — M1711 Unilateral primary osteoarthritis, right knee: Secondary | ICD-10-CM | POA: Diagnosis not present

## 2017-04-18 DIAGNOSIS — M7061 Trochanteric bursitis, right hip: Secondary | ICD-10-CM | POA: Diagnosis not present

## 2017-04-18 DIAGNOSIS — M7062 Trochanteric bursitis, left hip: Secondary | ICD-10-CM | POA: Diagnosis not present

## 2017-04-18 MED ORDER — GABAPENTIN 100 MG PO CAPS: 200.0000 mg | ORAL_CAPSULE | Freq: Every day | ORAL | 3 refills | Status: DC

## 2017-04-18 NOTE — Assessment & Plan Note (Signed)
Right side injected. Discussed icing regimen and home exercises. Discussed which activities doing which ones to avoid. Increase activity slowly over the course of next several days. Patient is getting the custom walker. Differential includes lumbar radiculopathy and we discussed gabapentin again which was refilled. Follow-up in 4-6 weeks.

## 2017-04-18 NOTE — Progress Notes (Signed)
[name] [address] [place], [place] [address] Phone: [phone] Subjective:    I'm seeing this patient by the request  of:    CC: Right sided hip, right sided knee  [id number]  [name] is a 83 y.o. female coming in for follow up for knee pain. Saturday and Sunday she was having troubles walking unassisted or assisted. Patient notes that her hip was giving away. She did have some improvement yesterday and today. Patient states over the knee continues to give her instability. She is concerned to do any type of walking. Patient was getting a custom walker in the next couple days. Patient is hoping that will be beneficial. Patient is also having right hip pain. Found have bilateral greater trochanter bursitis and has responded to injection. Concern for potential lumbar radiculopathy as well. Patient does not want any type of injection so we'll not on any advanced imaging.     Past Medical History:  Diagnosis Date  . Allergic rhinitis   . Anxiety   . Arthritis    "back, hands; hips" (12/22/2014)  . Asthma   . Bladder atony   . Chronic bronchitis ([place])   . COPD (chronic obstructive pulmonary disease) ([place])   . Diarrhea   . Emphysema of lung ([place])   . GERD (gastroesophageal reflux disease)    "w/spicey foods" (12/22/2014)  . Hyperlipidemia   . Non-small cell carcinoma of lung, stage 1 ([facility]) 2005   1.4 cm Poorly differentiated Squamous cell RUL  T1N0 resected 09/22/2003  . On home oxygen therapy    "3L q night and prn during the day" (12/22/2014)  . Pelvic cyst   . Pneumonia    "this is the 3rd time that I can remember" (12/22/2014)  . Unspecified hypothyroidism    Past Surgical History:  Procedure Laterality Date  . ABDOMINAL HYSTERECTOMY  ~ 1976  . BACK SURGERY    . CATARACT EXTRACTION W/ INTRAOCULAR LENS  IMPLANT, BILATERAL Bilateral ~ 2012  . DILATION AND CURETTAGE OF UTERUS    . LUMBAR DISC SURGERY  1970's  . LYMPH NODE DISSECTION   2005   "all my lymph nodes under breasts, went thru my back; Dr. Arlyce Dice did it"  . Needle aspiration Pelvic cyst  2011   Dr Diona Fanti  . VIDEO ASSISTED THORACOSCOPY (VATS)/THOROCOTOMY Right 09/2003   thoracotomy, right upper lobectomy with node dissection; Dr Demetrios Loll 11/02/2010  . VIDEO BRONCHOSCOPY  03/2004   Archie Endo 11/02/2010   Social History   Social History  . Marital status: Married    Spouse name: N/A  . Number of children: N/A  . Years of education: N/A   Occupational History  . Retired Animator, School system Retired   Social History Main Topics  . Smoking status: Former Smoker    Packs/day: 1.00    Years: 42.00    Types: Cigarettes    Quit date: 09/22/2003  . Smokeless tobacco: Never Used  . Alcohol use 8.4 oz/week    14 Glasses of wine per week     Comment: 12/22/2014 "I have a large glass of wine qd"  . Drug use: No  . Sexual activity: No   Other Topics Concern  . None   Social History Narrative  . None   Allergies  Allergen Reactions  . Ampicillin Diarrhea and Nausea Only    GI upset  . Azithromycin Diarrhea and Nausea And Vomiting  . Codeine Nausea And Vomiting and Other (See Comments)  Makes her "crazy"  . Daliresp [Roflumilast] Nausea Only  . Nitrofurantoin Diarrhea and Nausea Only  . Other Other (See Comments)    No nuts and seeds because of diverticulitis  . Penicillins Nausea And Vomiting, Swelling and Rash    Has patient had a PCN reaction causing immediate rash, facial/tongue/throat swelling, SOB or lightheadedness with hypotension: Yes Has patient had a PCN reaction causing severe rash involving mucus membranes or skin necrosis: No Has patient had a PCN reaction that required hospitalization No Has patient had a PCN reaction occurring within the last 10 years: No No "cillins" If all of the above answers are "NO", then may proceed with Cephalosporin use.   . Sulfonamide Derivatives Diarrhea and Nausea And Vomiting  . Tiotropium  Bromide Monohydrate     Urinary retention  . Doxycycline Nausea Only  . Fludrocortisone Acetate Itching  . Lactose Intolerance (Gi) Other (See Comments)    Gas and bloating   . Latex Itching and Rash  . Prednisone Other (See Comments)    Insomnia after 2 days   Family History  Problem Relation Age of Onset  . Heart disease Father   . Rheumatologic disease Brother   . Cancer Brother        Leukemia  . Rheumatologic disease Sister      Past medical history, social, surgical and family history all reviewed in electronic medical record.  No pertanent information unless stated regarding to the chief complaint.   Review of Systems:Review of systems updated and as accurate as of 04/18/17  No headache, visual changes, nausea, vomiting, diarrhea, constipation, dizziness, abdominal pain, skin rash, fevers, chills, night sweats, weight loss, swollen lymph nodes,chest pain, shortness of breath, mood changes. Positive muscle aches, body aches,  Objective  Blood pressure 118/70, pulse (!) 38, weight 148 lb (67.1 kg), SpO2 (!) 88 %. Systems examined below as of 04/18/17   General: No apparent distress alert and oriented x3 mood and affect normal, dressed appropriately.  HEENT: Pupils equal, extraocular movements intact  Respiratory: Patient's speak in full sentences and does not appear short of breath  Cardiovascular: No lower extremity edema, non tender, no erythema  Skin: Warm dry intact with no signs of infection or rash on extremities or on axial skeleton.  Abdomen: Soft nontender  Neuro: Cranial nerves II through XII are intact, neurovascularly intact in all extremities with 2+ DTRs and 2+ pulses.  Lymph: No lymphadenopathy of posterior or anterior cervical chain or axillae bilaterally.  Gait Patient is in a wheelchair MSK:  Moderate tender with full range of motion and good stability and symmetric strength and tone of shoulders, elbows, wrist and ankles bilaterally.  Knee:  Right valgus deformity noted. Abnormal thigh to calf ratio.  Tender to palpation over medial and PF joint line.  ROM full in flexion and extension and lower leg rotation. instability with valgus force.  painful patellar compression. Patellar glide with moderate crepitus. Patellar and quadriceps tendons unremarkable. Hamstring and quadriceps strength is normal. Contralateral knee shows mild facet changes but no pain Right hip pain shows severe pain over the greater trochanteric area. Positive straight leg test. Patient has some mild limitation and external and internal range of motion.  After informed written and verbal consent, patient was seated on exam table. Right knee was prepped with alcohol swab and utilizing anterolateral approach, patient's right knee space was injected with monovisc 22mg /mL. Patient tolerated the procedure well without immediate complications.   Procedure: Real-time Ultrasound Guided Injection of right greater  trochanteric bursitis secondary to patient's body habitus Device: GE Logiq Q7 Ultrasound guided injection is preferred based studies that show increased duration, increased effect, greater accuracy, decreased procedural pain, increased response rate, and decreased cost with ultrasound guided versus blind injection.  Verbal informed consent obtained.  Time-out conducted.  Noted no overlying erythema, induration, or other signs of local infection.  Skin prepped in a sterile fashion.  Local anesthesia: Topical Ethyl chloride.  With sterile technique and under real time ultrasound guidance:  Greater trochanteric area was visualized and patient's bursa was noted. A 22-gauge 3 inch needle was inserted and 4 cc of 0.5% Marcaine and 1 cc of Kenalog 40 mg/dL was injected. Pictures taken Completed without difficulty  Pain immediately resolved suggesting accurate placement of the medication.  Advised to call if fevers/chills, erythema, induration, drainage, or  persistent bleeding.  Images unable to be seen so no saved image for no charge Impression: Technically successful ultrasound guided injection.   Impression and Recommendations:     This case required medical decision making of moderate complexity.      Note: This dictation was prepared with Dragon dictation along with smaller phrase technology. Any transcriptional errors that result from this process are unintentional.

## 2017-04-18 NOTE — Patient Instructions (Addendum)
Good to see you  [name] is your friend.  Monovisc given and injected the hip  If tingling then would consider gabapentin at night See me again in 4-6 weeks.

## 2017-04-18 NOTE — Assessment & Plan Note (Signed)
Failed conservative therapy. Started viscous supplementation. Patient is not a candidate for any type of joint replacement. Can repeat steroid injection if the pain comes back. [name] this will be beneficial. Encourage her to monitor closely.

## 2017-04-26 ENCOUNTER — Ambulatory Visit: Payer: PPO | Admitting: Family Medicine

## 2017-05-17 DIAGNOSIS — J449 Chronic obstructive pulmonary disease, unspecified: Secondary | ICD-10-CM | POA: Diagnosis not present

## 2017-05-17 DIAGNOSIS — J9611 Chronic respiratory failure with hypoxia: Secondary | ICD-10-CM | POA: Diagnosis not present

## 2017-05-18 DIAGNOSIS — J479 Bronchiectasis, uncomplicated: Secondary | ICD-10-CM | POA: Diagnosis not present

## 2017-05-30 ENCOUNTER — Ambulatory Visit: Payer: PPO | Admitting: Internal Medicine

## 2017-05-30 NOTE — Patient Instructions (Addendum)
Have a chest x-ray and blood work today.  Test(s) ordered today. Your results will be released to [place] (or called to you) after review, usually within 72hours after test completion. If any changes need to be made, you will be notified at that same time.   Medications reviewed and updated.   No changes recommended at this time.  A mammogram was ordered - someone will call you to schedule.   Please followup in 6 months

## 2017-05-30 NOTE — Progress Notes (Signed)
Subjective:    Patient ID: [name], female    DOB: 1933/01/13, 83 y.o.   MRN: [id number]  HPI The patient is here for follow up.  Hypothyroidism:  She is taking her medication daily. The past few months she has not felt as well.  She has had less energy and thinks she has lost a few pounds.  Her appetite is less.  Hyperlipidemia: She is taking her medication daily. She is compliant with a low fat/cholesterol diet. She is not exercising regularly. She denies myalgias.   Anxiety: She is taking her medication daily as prescribed. She denies any side effects from the medication. She feels her anxiety is not ideally controlled and she thinks at times she needs medication during the day.  At this time she thinks she is ok, but she does feel some anxiety during the day.   Hyperglycemia:  She is compliant with a low sugar/carbohydrate diet.  She is not exercising regularly.  Right sided chest pain;  Recently she has had right sided chest pain.  She thinks it is the chest PT vest she is wearing, but was concerned it was the breast.  She has not had her mammo this year and thought she should do that.  She has chronic cough, wheeze and SOB and denies any changes.     Medications and allergies reviewed with patient and updated if appropriate.  Patient Active Problem List   Diagnosis Date Noted  . Osteopenia 02/16/2017  . Hyperglycemia 12/08/2016  . Degenerative arthritis of right knee 10/27/2016  . 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 Greater trochanteric bursitis of both hips 04/08/2016  . Pain in both knees 04/08/2016  . Chronic midline low back pain without sciatica 04/08/2016  . Dysuria 12/02/2015  . Hip pain 12/02/2015  . Chronic respiratory failure with hypoxia ([place]) 03/07/2015  . Diverticulitis 05/24/2014  . Anxiety   . Bladder prolapse, female, acquired   . Arthritis   . Non-small cell carcinoma of lung, stage 1 ([place]) 01/10/2012  .  Neuropathy, peripheral 01/28/2011  . COPD mixed type ([place]) 09/08/2010  . Dyslipidemia   . ATONY OF BLADDER 08/10/2009  . Hypothyroidism 03/12/2008  . Seasonal allergic rhinitis 10/10/2007    Current Outpatient Medications on File Prior to Visit  Medication Sig Dispense Refill  . acetaminophen (TYLENOL ARTHRITIS PAIN) 650 MG CR tablet Take 650 mg by mouth daily as needed for pain.     [name] albuterol (PROVENTIL HFA;VENTOLIN HFA) 108 (90 Base) MCG/ACT inhaler Inhale 2 puffs into the lungs every 6 (six) hours as needed for wheezing or shortness of breath. 1 Inhaler 12  . ALPRAZolam (XANAX) 1 MG tablet TAKE 1 TABLET BY MOUTH EVERY EVENING OR AT BEDTIME 30 tablet 1  . aspirin EC 81 MG tablet Take 81 mg by mouth 2 (two) times a week.     [name] atorvastatin (LIPITOR) 20 MG tablet TAKE 1 TABLET (20 MG TOTAL) BY MOUTH DAILY. 90 tablet 1  . Cholecalciferol (VITAMIN D) 2000 UNITS tablet Take 1 tablet (2,000 Units total) by mouth daily. 30 tablet 11  . fluticasone (FLOVENT HFA) 110 MCG/ACT inhaler Inhale 2 puffs into the lungs 2 (two) times daily. 1 Inhaler 12  . levalbuterol (XOPENEX) 1.25 MG/3ML nebulizer solution Take 1.25 mg by nebulization every 8 (eight) hours as needed for wheezing. 360 mL 2  . levothyroxine (SYNTHROID, LEVOTHROID) 88 MCG tablet TAKE 1 TABLET BY MOUTH EVERY DAY 90 tablet 0  .  Multiple Vitamins-Minerals (CENTRUM SILVER PO) Take 1 tablet by mouth daily.     [name] Respiratory Therapy Supplies (FLUTTER) DEVI Blow through 4 times per cycle and repeat 3 cycles per day 1 each 0   No current facility-administered medications on file prior to visit.     Past Medical History:  Diagnosis Date  . Allergic rhinitis   . Anxiety   . Arthritis    "back, hands; hips" (12/22/2014)  . Asthma   . Bladder atony   . Chronic bronchitis ([place])   . COPD (chronic obstructive pulmonary disease) ([place])   . Diarrhea   . Emphysema of lung ([place])   . GERD (gastroesophageal reflux disease)    "w/spicey foods"  (12/22/2014)  . Hyperlipidemia   . Non-small cell carcinoma of lung, stage 1 ([place]) 2005   1.4 cm Poorly differentiated Squamous cell RUL  T1N0 resected 09/22/2003  . On home oxygen therapy    "3L q night and prn during the day" (12/22/2014)  . Pelvic cyst   . Pneumonia    "this is the 3rd time that I can remember" (12/22/2014)  . Unspecified hypothyroidism     Past Surgical History:  Procedure Laterality Date  . ABDOMINAL HYSTERECTOMY  ~ 1976  . BACK SURGERY    . CATARACT EXTRACTION W/ INTRAOCULAR LENS  IMPLANT, BILATERAL Bilateral ~ 2012  . DILATION AND CURETTAGE OF UTERUS    . LUMBAR DISC SURGERY  1970's  . LYMPH NODE DISSECTION  2005   "all my lymph nodes under breasts, went thru my back; Dr. Arlyce Dice did it"  . Needle aspiration Pelvic cyst  2011   Dr Diona Fanti  . VIDEO ASSISTED THORACOSCOPY (VATS)/THOROCOTOMY Right 09/2003   thoracotomy, right upper lobectomy with node dissection; Dr Demetrios Loll 11/02/2010  . VIDEO BRONCHOSCOPY  03/2004   Archie Endo 11/02/2010    Social History   Socioeconomic History  . Marital status: Married    Spouse name: Not on file  . Number of children: Not on file  . Years of education: Not on file  . Highest education level: Not on file  Social Needs  . Financial resource strain: Not on file  . Food insecurity - worry: Not on file  . Food insecurity - inability: Not on file  . Transportation needs - medical: Not on file  . Transportation needs - non-medical: Not on file  Occupational History  . Occupation: Retired Animator, School system    Employer: RETIRED  Tobacco Use  . Smoking status: Former Smoker    Packs/day: 1.00    Years: 42.00    Pack years: 42.00    Types: Cigarettes    Last attempt to quit: 09/22/2003    Years since quitting: 13.7  . Smokeless tobacco: Never Used  Substance and Sexual Activity  . Alcohol use: Yes    Alcohol/week: 8.4 oz    Types: 14 Glasses of wine per week    Comment: 12/22/2014 "I have a large glass of wine  qd"  . Drug use: No  . Sexual activity: No  Other Topics Concern  . Not on file  Social History Narrative  . Not on file    Family History  Problem Relation Age of Onset  . Heart disease Father   . Rheumatologic disease Brother   . Cancer Brother        Leukemia  . Rheumatologic disease Sister     Review of Systems  Constitutional: Positive for appetite change (decreased), fatigue and unexpected weight change.  Negative for chills and fever.  Respiratory: Positive for cough (chronic, no change), shortness of breath and wheezing (chronic, no change).   Cardiovascular: Positive for chest pain (right sided chest wall pain, no other CP). Negative for palpitations and leg swelling.  Gastrointestinal: Positive for nausea. Negative for abdominal pain, constipation and diarrhea.  Neurological: Negative for light-headedness and headaches.       Objective:   Vitals:   06/02/17 1127  BP: 134/76  Pulse: 80  Resp: 18  Temp: 98.6 F (37 C)  SpO2: 98%   Wt Readings from Last 3 Encounters:  06/02/17 147 lb (66.7 kg)  04/18/17 148 lb (67.1 kg)  03/29/17 148 lb (67.1 kg)   Body mass index is 24.46 kg/m.   Physical Exam    Constitutional: chronically ill appearing. No distress.  HENT:  Head: Normocephalic and atraumatic.  Neck: Neck supple. No tracheal deviation present. No thyromegaly present.  No cervical lymphadenopathy Cardiovascular: Normal rate, regular rhythm and normal heart sounds.   No murmur heard. No carotid bruit .  No edema Pulmonary/Chest: right upper chest tenderness with palpation - difficult to tell if it is the chest wall or breast.  No breast lumps palpable. Effort normal and breath sounds normal. No respiratory distress. No has no wheezes. No rales.  Skin: Skin is warm and dry. Not diaphoretic.  Psychiatric: Normal mood and affect. Behavior is normal.      Assessment & Plan:    See Problem List for Assessment and Plan of chronic medical problems.

## 2017-06-02 ENCOUNTER — Other Ambulatory Visit (INDEPENDENT_AMBULATORY_CARE_PROVIDER_SITE_OTHER): Payer: PPO

## 2017-06-02 ENCOUNTER — Ambulatory Visit (INDEPENDENT_AMBULATORY_CARE_PROVIDER_SITE_OTHER): Payer: PPO | Admitting: Internal Medicine

## 2017-06-02 ENCOUNTER — Ambulatory Visit (INDEPENDENT_AMBULATORY_CARE_PROVIDER_SITE_OTHER)
Admission: RE | Admit: 2017-06-02 | Discharge: 2017-06-02 | Disposition: A | Discharge status: Home / Self Care | Payer: PPO | Source: Ambulatory Visit | Attending: Internal Medicine | Admitting: Internal Medicine

## 2017-06-02 ENCOUNTER — Encounter: Payer: Self-pay | Admitting: Internal Medicine

## 2017-06-02 VITALS — BP 134/76 | HR 80 | Temp 98.6°F | Resp 18 | Wt 147.0 lb

## 2017-06-02 DIAGNOSIS — R739 Hyperglycemia, unspecified: Secondary | ICD-10-CM

## 2017-06-02 DIAGNOSIS — E038 Other specified hypothyroidism: Secondary | ICD-10-CM

## 2017-06-02 DIAGNOSIS — E785 Hyperlipidemia, unspecified: Secondary | ICD-10-CM | POA: Diagnosis not present

## 2017-06-02 DIAGNOSIS — R079 Chest pain, unspecified: Secondary | ICD-10-CM

## 2017-06-02 DIAGNOSIS — J449 Chronic obstructive pulmonary disease, unspecified: Secondary | ICD-10-CM | POA: Diagnosis not present

## 2017-06-02 DIAGNOSIS — F419 Anxiety disorder, unspecified: Secondary | ICD-10-CM

## 2017-06-02 LAB — COMPREHENSIVE METABOLIC PANEL
ALBUMIN: 3.7 g/dL (ref 3.5–5.2)
ALT: 16 U/L (ref 0–35)
AST: 24 U/L (ref 0–37)
Alkaline Phosphatase: 104 U/L (ref 39–117)
BILIRUBIN TOTAL: 0.7 mg/dL (ref 0.2–1.2)
BUN: 5 mg/dL — AB (ref 6–23)
CALCIUM: 9.5 mg/dL (ref 8.4–10.5)
CHLORIDE: 95 meq/L — AB (ref 96–112)
CO2: 35 meq/L — AB (ref 19–32)
CREATININE: 0.7 mg/dL (ref 0.40–1.20)
GFR: 85.12 mL/min (ref >60.00)
Glucose, Bld: 98 mg/dL (ref 70–99)
Potassium: 4 mEq/L (ref 3.5–5.1)
SODIUM: 135 meq/L (ref 135–145)
Total Protein: 6.8 g/dL (ref 6.0–8.3)

## 2017-06-02 LAB — CBC WITH DIFFERENTIAL/PLATELET
BASOS ABS: 0.1 10*3/uL (ref 0.0–0.1)
BASOS PCT: 0.9 % (ref 0.0–3.0)
EOS ABS: 0 10*3/uL (ref 0.0–0.7)
Eosinophils Relative: 0.4 % (ref 0.0–5.0)
HEMATOCRIT: 36.7 % (ref 36.0–46.0)
Hemoglobin: 12.2 g/dL (ref 12.0–15.0)
LYMPHS PCT: 19.8 % (ref 12.0–46.0)
Lymphs Abs: 1.3 10*3/uL (ref 0.7–4.0)
MCHC: 33.1 g/dL (ref 30.0–36.0)
MCV: 97.9 fl (ref 78.0–100.0)
MONO ABS: 0.6 10*3/uL (ref 0.1–1.0)
Monocytes Relative: 9.5 % (ref 3.0–12.0)
NEUTROS ABS: 4.6 10*3/uL (ref 1.4–7.7)
NEUTROS PCT: 69.4 % (ref 43.0–77.0)
PLATELETS: 254 10*3/uL (ref 150.0–400.0)
RBC: 3.75 Mil/uL — ABNORMAL LOW (ref 3.87–5.11)
RDW: 14.2 % (ref 11.5–15.5)
WBC: 6.6 10*3/uL (ref 4.0–10.5)

## 2017-06-02 LAB — HEMOGLOBIN A1C: HEMOGLOBIN A1C: 5.2 % (ref 4.6–6.5)

## 2017-06-02 LAB — TSH: TSH: 3.7 u[IU]/mL (ref 0.35–4.50)

## 2017-06-02 NOTE — Assessment & Plan Note (Signed)
Check a1c 

## 2017-06-02 NOTE — Assessment & Plan Note (Signed)
Possible from chest PT vest Will check CXR to make sure if it not any lung pathology  Will order mammogram - chest pain tender - less likely breast pain, but it is difficult to tell Will discuss chest PT vest with Dr Annamaria Boots

## 2017-06-02 NOTE — Assessment & Plan Note (Signed)
Taking xanax nightly Has anxiety during the day - may need to eventually increase xanax to BID but will continue at night only for now

## 2017-06-02 NOTE — Assessment & Plan Note (Signed)
Continue daily statin healthy diet encouraged

## 2017-06-02 NOTE — Assessment & Plan Note (Signed)
Check tsh  Titrate med dose if needed  

## 2017-06-04 ENCOUNTER — Other Ambulatory Visit: Payer: Self-pay | Admitting: Internal Medicine

## 2017-06-09 ENCOUNTER — Telehealth: Payer: Self-pay | Admitting: Family Medicine

## 2017-06-09 NOTE — Telephone Encounter (Signed)
Copied from [place]. Topic: General - Other >> Jun 09, 2017  3:45 PM Carolyn Stare wrote:   Pt is asking if she can have a refill on gabapentin till she  can make an appt. She said Dr Tamala Julian gave her an injection back in Oct and now it has worn off  [place]

## 2017-06-12 ENCOUNTER — Other Ambulatory Visit: Payer: Self-pay | Admitting: *Deleted

## 2017-06-12 MED ORDER — GABAPENTIN 100 MG PO CAPS: 200.0000 mg | ORAL_CAPSULE | Freq: Every day | ORAL | 0 refills | Status: DC

## 2017-06-12 NOTE — Telephone Encounter (Signed)
Refill sent in to pharmacy 

## 2017-06-15 ENCOUNTER — Ambulatory Visit
Admission: RE | Admit: 2017-06-15 | Discharge: 2017-06-15 | Disposition: A | Discharge status: Home / Self Care | Payer: PPO | Source: Ambulatory Visit | Attending: Internal Medicine | Admitting: Internal Medicine

## 2017-06-15 ENCOUNTER — Ambulatory Visit: Admission: RE | Admit: 2017-06-15 | Payer: PPO | Source: Ambulatory Visit

## 2017-06-15 DIAGNOSIS — R922 Inconclusive mammogram: Secondary | ICD-10-CM | POA: Diagnosis not present

## 2017-06-15 DIAGNOSIS — R079 Chest pain, unspecified: Secondary | ICD-10-CM

## 2017-06-16 DIAGNOSIS — J449 Chronic obstructive pulmonary disease, unspecified: Secondary | ICD-10-CM | POA: Diagnosis not present

## 2017-06-16 DIAGNOSIS — J9611 Chronic respiratory failure with hypoxia: Secondary | ICD-10-CM | POA: Diagnosis not present

## 2017-06-17 DIAGNOSIS — J479 Bronchiectasis, uncomplicated: Secondary | ICD-10-CM | POA: Diagnosis not present

## 2017-06-18 ENCOUNTER — Other Ambulatory Visit: Payer: Self-pay | Admitting: Internal Medicine

## 2017-06-19 NOTE — Telephone Encounter (Signed)
[place] Controlled Substance Database checked. Last filled on 05/18/17

## 2017-06-22 ENCOUNTER — Encounter: Payer: Self-pay | Admitting: Family Medicine

## 2017-06-22 ENCOUNTER — Ambulatory Visit (INDEPENDENT_AMBULATORY_CARE_PROVIDER_SITE_OTHER): Payer: PPO | Admitting: Family Medicine

## 2017-06-22 VITALS — BP 116/58 | HR 93 | Temp 99.1°F | Ht 65.0 in | Wt 147.0 lb

## 2017-06-22 DIAGNOSIS — M7061 Trochanteric bursitis, right hip: Secondary | ICD-10-CM | POA: Diagnosis not present

## 2017-06-22 DIAGNOSIS — M1711 Unilateral primary osteoarthritis, right knee: Secondary | ICD-10-CM | POA: Diagnosis not present

## 2017-06-22 DIAGNOSIS — M7062 Trochanteric bursitis, left hip: Secondary | ICD-10-CM

## 2017-06-22 NOTE — Patient Instructions (Signed)
Please let me know if your pain doesn't improve and we could consider an oral medicine.  I have sent a referral to home health physical therapy.

## 2017-06-22 NOTE — Progress Notes (Signed)
DEAUNNA OLARTE - 84 y.o. female MRN [id number]  Date of birth: 1933-05-13  SUBJECTIVE:  Including CC & ROS.  Chief Complaint  Patient presents with  . Right hip and knee pain    Ashanna Heinsohn is a 84 y.o. female that is presenting with right hip and knee pain. Paint has been increasing over the past three weeks. Pain is located at her right hip radiates downward to her leg. Admits tingling and numbness. She uses a cane and walker when ambulating. She received an injection in 04/18/17 with some improvement in her pain. She reports having some improvement with the viscous supplementation that was provided in her right knee in October. She cannot localize the pain. The pain is worse with movement. She is in a wheelchair today. She is unable to walk long distances secondary to her shortness of breath. She is on oxygen after having her right lung partially removed. She had a history of lung cancer. She does stand side most days. She feels weak. She has not had any injury or previous surgery on her back. There is no inciting event to her pain. She does have pain on the right lateral aspect of the hip. And there is some radiation distally.  Independent review of the MRI lumbar spine from 2018 shows disc degeneration and spinal stenosis and neural foraminal stenosis at different levels.  Independent review of the lumbar spine x-ray from May 2018 shows degenerative changes of the facets with mild curvature of the spine  Review of the CT scan of the abdomen and pelvis from 2015 shows degenerative changes of the thoracolumbar spine and sigmoid diverticulitis.  Review of Systems  Constitutional: Negative for fever.  HENT: Negative for sinus pressure.   Respiratory: Positive for shortness of breath. Negative for cough.   Cardiovascular: Negative for chest pain.  Genitourinary: Negative for dysuria.  Musculoskeletal: Positive for back pain and gait problem.  Skin: Negative for color change.    Neurological: Positive for weakness. Negative for numbness.  Hematological: Negative for adenopathy.  Psychiatric/Behavioral: Negative for agitation.    HISTORY: Past Medical, Surgical, Social, and Family History Reviewed & Updated per EMR.   Pertinent Historical Findings include:  Past Medical History:  Diagnosis Date  . Allergic rhinitis   . Anxiety   . Arthritis    "back, hands; hips" (12/22/2014)  . Asthma   . Bladder atony   . Chronic bronchitis ([place])   . COPD (chronic obstructive pulmonary disease) ([place])   . Diarrhea   . Emphysema of lung ([place])   . GERD (gastroesophageal reflux disease)    "w/spicey foods" (12/22/2014)  . Hyperlipidemia   . Non-small cell carcinoma of lung, stage 1 ([place]) 2005   1.4 cm Poorly differentiated Squamous cell RUL  T1N0 resected 09/22/2003  . On home oxygen therapy    "3L q night and prn during the day" (12/22/2014)  . Pelvic cyst   . Pneumonia    "this is the 3rd time that I can remember" (12/22/2014)  . Unspecified hypothyroidism     Past Surgical History:  Procedure Laterality Date  . ABDOMINAL HYSTERECTOMY  ~ 1976  . BACK SURGERY    . CATARACT EXTRACTION W/ INTRAOCULAR LENS  IMPLANT, BILATERAL Bilateral ~ 2012  . DILATION AND CURETTAGE OF UTERUS    . LUMBAR DISC SURGERY  1970's  . LYMPH NODE DISSECTION  2005   "all my lymph nodes under breasts, went thru my back; Dr. Arlyce Dice did it"  . Needle aspiration  Pelvic cyst  2011   Dr Diona Fanti  . VIDEO ASSISTED THORACOSCOPY (VATS)/THOROCOTOMY Right 09/2003   thoracotomy, right upper lobectomy with node dissection; Dr Demetrios Loll 11/02/2010  . VIDEO BRONCHOSCOPY  03/2004   Archie Endo 11/02/2010    Allergies  Allergen Reactions  . Ampicillin Diarrhea and Nausea Only    GI upset  . Azithromycin Diarrhea and Nausea And Vomiting  . Codeine Nausea And Vomiting and Other (See Comments)    Makes her "crazy"  . Daliresp [Roflumilast] Nausea Only  . Nitrofurantoin Diarrhea and Nausea Only  . Other Other  (See Comments)    No nuts and seeds because of diverticulitis  . Penicillins Nausea And Vomiting, Swelling and Rash    Has patient had a PCN reaction causing immediate rash, facial/tongue/throat swelling, SOB or lightheadedness with hypotension: Yes Has patient had a PCN reaction causing severe rash involving mucus membranes or skin necrosis: No Has patient had a PCN reaction that required hospitalization No Has patient had a PCN reaction occurring within the last 10 years: No No "cillins" If all of the above answers are "NO", then may proceed with Cephalosporin use.   . Sulfonamide Derivatives Diarrhea and Nausea And Vomiting  . Tiotropium Bromide Monohydrate     Urinary retention  . Doxycycline Nausea Only  . Fludrocortisone Acetate Itching  . Lactose Intolerance (Gi) Other (See Comments)    Gas and bloating   . Latex Itching and Rash  . Prednisone Other (See Comments)    Insomnia after 2 days    Family History  Problem Relation Age of Onset  . Heart disease Father   . Rheumatologic disease Brother   . Cancer Brother        Leukemia  . Rheumatologic disease Sister   . Breast cancer Daughter      Social History   Socioeconomic History  . Marital status: Married    Spouse name: Not on file  . Number of children: Not on file  . Years of education: Not on file  . Highest education level: Not on file  Social Needs  . Financial resource strain: Not on file  . Food insecurity - worry: Not on file  . Food insecurity - inability: Not on file  . Transportation needs - medical: Not on file  . Transportation needs - non-medical: Not on file  Occupational History  . Occupation: Retired Animator, School system    Employer: RETIRED  Tobacco Use  . Smoking status: Former Smoker    Packs/day: 1.00    Years: 42.00    Pack years: 42.00    Types: Cigarettes    Last attempt to quit: 09/22/2003    Years since quitting: 13.7  . Smokeless tobacco: Never Used  Substance and  Sexual Activity  . Alcohol use: Yes    Alcohol/week: 8.4 oz    Types: 14 Glasses of wine per week    Comment: 12/22/2014 "I have a large glass of wine qd"  . Drug use: No  . Sexual activity: No  Other Topics Concern  . Not on file  Social History Narrative  . Not on file     PHYSICAL EXAM:  VS: BP (!) 116/58 (BP Location: Left Arm, Patient Position: Sitting, Cuff Size: Normal)   Pulse 93   Temp 99.1 F (37.3 C) (Oral)   Ht 5\' 5"  (1.651 m)   Wt 147 lb (66.7 kg)   SpO2 94%   BMI 24.46 kg/m  Physical Exam Gen: NAD, alert, cooperative with  exam, [place] in place  ENT: normal lips, normal nasal mucosa,  Eye: normal EOM, normal conjunctiva and lids CV:  no edema, +2 pedal pulses   Resp: no accessory muscle use, non-labored,  Skin: no rashes, no areas of induration  Neuro: normal tone, normal sensation to touch Psych:  normal insight, alert and oriented MSK:  Right Knee: Normal to inspection with no erythema or effusion or obvious bony abnormalities. Palpation normal with no warmth, joint line tenderness, patellar tenderness, or condyle tenderness. ROM full in flexion and extension and lower leg rotation. Ligaments with solid consistent endpoints including LCL, MCL. Negative Mcmurray's Non painful patellar compression. Patellar and quadriceps tendons unremarkable. Hamstring and quadriceps strength is normal.  Back/Hip:  TTP of the GT  Normal IR and ER  No TTP of the piriformis Negative SLR b/l  Sitting in wheelchair  Neurovascularly intact     Aspiration/Injection Procedure Note CHRIS NARASIMHAN 11-22-1932  Procedure: Injection Indications: right knee pain  Procedure Details Consent: Risks of procedure as well as the alternatives and risks of each were explained to the (patient/caregiver).  Consent for procedure obtained. Time Out: Verified patient identification, verified procedure, site/side was marked, verified correct patient position, special equipment/implants  available, medications/allergies/relevent history reviewed, required imaging and test results available.  Performed.  The area was cleaned with iodine and alcohol swabs.    The right knee joint was injected using 1 cc's of 40 mg Depomedrol and 4 cc's of 1% lidocaine with a 22 1 1/2" needle.  A sterile dressing was applied.  Patient did tolerate procedure well.   Aspiration/Injection Procedure Note MARGORIE RENNER 1933/02/02  Procedure: Injection Indications: right hip pain   Procedure Details Consent: Risks of procedure as well as the alternatives and risks of each were explained to the (patient/caregiver).  Consent for procedure obtained. Time Out: Verified patient identification, verified procedure, site/side was marked, verified correct patient position, special equipment/implants available, medications/allergies/relevent history reviewed, required imaging and test results available.  Performed.  The area was cleaned with iodine and alcohol swabs.    The right Greater trochanter bursa was injected using 1 cc's of 40 mg Depomedrol and  4 cc's of 1% lidocaine with a 25 1 1/2" needle.    A sterile dressing was applied.  Patient did tolerate procedure well.    ASSESSMENT & PLAN:   Greater trochanteric bursitis of both hips Has a history of pain in the GT region. Has weakness and trouble ambulating. Unclear if there is a component that is originating from her back  - GT injection today  - If no improvement consider oral steroids  - referral to home health PT.   Degenerative arthritis of right knee Has completed gel injection and steroid injections in the past. The pain today seems more sciatic than localized to the knee. Will accomplish a therapeutic and diagnostic injection today. - Injection today - Could provide samples of Pennsaid - Could try gel injections again in April.

## 2017-06-23 NOTE — Assessment & Plan Note (Signed)
Has a history of pain in the GT region. Has weakness and trouble ambulating. Unclear if there is a component that is originating from her back  - GT injection today  - If no improvement consider oral steroids  - referral to home health PT.

## 2017-06-23 NOTE — Assessment & Plan Note (Signed)
Has completed gel injection and steroid injections in the past. The pain today seems more sciatic than localized to the knee. Will accomplish a therapeutic and diagnostic injection today. - Injection today - Could provide samples of Pennsaid - Could try gel injections again in April.

## 2017-06-24 ENCOUNTER — Ambulatory Visit (HOSPITAL_COMMUNITY)
Admission: EM | Admit: 2017-06-24 | Discharge: 2017-06-24 | Disposition: A | Discharge status: Home / Self Care | Payer: PPO | Attending: Family Medicine | Admitting: Family Medicine

## 2017-06-24 ENCOUNTER — Encounter (HOSPITAL_COMMUNITY): Payer: Self-pay | Admitting: Emergency Medicine

## 2017-06-24 ENCOUNTER — Other Ambulatory Visit: Payer: Self-pay

## 2017-06-24 DIAGNOSIS — M26622 Arthralgia of left temporomandibular joint: Secondary | ICD-10-CM

## 2017-06-24 MED ORDER — PREDNISONE 10 MG (21) PO TBPK: ORAL_TABLET | ORAL | 0 refills | Status: DC

## 2017-06-24 NOTE — ED Triage Notes (Signed)
Pt c/o soreness in the L side of her neck, also c/o Ear pain. Pt states the L side of her neck is swollen.

## 2017-06-24 NOTE — ED Provider Notes (Signed)
[place]   [id number] 06/24/17 Arrival Time: 2542  ASSESSMENT & PLAN:  1. Arthralgia of left temporomandibular joint     Meds ordered this encounter  Medications  . predniSONE (STERAPRED UNI-PAK 21 TAB) 10 MG (21) TBPK tablet    Sig: Take as directed.    Dispense:  21 tablet    Refill:  0   She will try ibuprofen first. Plans to schedule f/u with her doctor early next week. May f/u here as needed.  Reviewed expectations re: course of current medical issues. Questions answered. Outlined signs and symptoms indicating need for more acute intervention. Patient verbalized understanding. After Visit Summary given.   SUBJECTIVE: History from: patient and family. [name] is a 84 y.o. female who presents with complaint of intermittent L jaw discomfort. Reports gradual onset over the past few days. No injury. Described symptoms have been intermittent since beginning. Opening and closing mouth exacerbates discomfort. Has noticed mild swelling. No OTC treatment. Tolerating normal PO intake. No skin changes. Afebrile. No dental pain described. "Throat feels kind of scratchy." No respiratory symptoms.  ROS: As per HPI.   OBJECTIVE:  Vitals:   06/24/17 1300  BP: 139/73  Pulse: 88  Resp: 16  Temp: 98.3 F (36.8 C)  SpO2: 96%    General appearance: alert; no distress Eyes: PERRLA; EOMI; conjunctiva normal HENT: normocephalic; atraumatic; tender over her L TMJ with mild swelling and point tenderness; oral mucosa normal Neck: supple without LAD Abdomen: soft, non-tender; bowel sounds normal; no masses or organomegaly; no guarding or rebound tenderness Extremities: no cyanosis or edema; symmetrical with no gross deformities Skin: warm and dry Psychological: alert and cooperative; normal mood and affect   Allergies  Allergen Reactions  . Ampicillin Diarrhea and Nausea Only    GI upset  . Azithromycin Diarrhea and Nausea And Vomiting  . Codeine Nausea And  Vomiting and Other (See Comments)    Makes her "crazy"  . Daliresp [Roflumilast] Nausea Only  . Nitrofurantoin Diarrhea and Nausea Only  . Other Other (See Comments)    No nuts and seeds because of diverticulitis  . Penicillins Nausea And Vomiting, Swelling and Rash    Has patient had a PCN reaction causing immediate rash, facial/tongue/throat swelling, SOB or lightheadedness with hypotension: Yes Has patient had a PCN reaction causing severe rash involving mucus membranes or skin necrosis: No Has patient had a PCN reaction that required hospitalization No Has patient had a PCN reaction occurring within the last 10 years: No No "cillins" If all of the above answers are "NO", then may proceed with Cephalosporin use.   . Sulfonamide Derivatives Diarrhea and Nausea And Vomiting  . Tiotropium Bromide Monohydrate     Urinary retention  . Doxycycline Nausea Only  . Fludrocortisone Acetate Itching  . Lactose Intolerance (Gi) Other (See Comments)    Gas and bloating   . Latex Itching and Rash  . Prednisone Other (See Comments)    Insomnia after 2 days    Past Medical History:  Diagnosis Date  . Allergic rhinitis   . Anxiety   . Arthritis    "back, hands; hips" (12/22/2014)  . Asthma   . Bladder atony   . Chronic bronchitis ([place])   . COPD (chronic obstructive pulmonary disease) ([place])   . Diarrhea   . Emphysema of lung ([place])   . GERD (gastroesophageal reflux disease)    "w/spicey foods" (12/22/2014)  . Hyperlipidemia   . Non-small cell carcinoma of lung, stage 1 (  [place]) 2005   1.4 cm Poorly differentiated Squamous cell RUL  T1N0 resected 09/22/2003  . On home oxygen therapy    "3L q night and prn during the day" (12/22/2014)  . Pelvic cyst   . Pneumonia    "this is the 3rd time that I can remember" (12/22/2014)  . Unspecified hypothyroidism    Social History   Socioeconomic History  . Marital status: Married    Spouse name: Not on file  . Number of children: Not on file  . Years of  education: Not on file  . Highest education level: Not on file  Social Needs  . Financial resource strain: Not on file  . Food insecurity - worry: Not on file  . Food insecurity - inability: Not on file  . Transportation needs - medical: Not on file  . Transportation needs - non-medical: Not on file  Occupational History  . Occupation: Retired Animator, School system    Employer: RETIRED  Tobacco Use  . Smoking status: Former Smoker    Packs/day: 1.00    Years: 42.00    Pack years: 42.00    Types: Cigarettes    Last attempt to quit: 09/22/2003    Years since quitting: 13.7  . Smokeless tobacco: Never Used  Substance and Sexual Activity  . Alcohol use: Yes    Alcohol/week: 8.4 oz    Types: 14 Glasses of wine per week    Comment: 12/22/2014 "I have a large glass of wine qd"  . Drug use: No  . Sexual activity: No  Other Topics Concern  . Not on file  Social History Narrative  . Not on file   Family History  Problem Relation Age of Onset  . Heart disease Father   . Rheumatologic disease Brother   . Cancer Brother        Leukemia  . Rheumatologic disease Sister   . Breast cancer Daughter    Past Surgical History:  Procedure Laterality Date  . ABDOMINAL HYSTERECTOMY  ~ 1976  . BACK SURGERY    . CATARACT EXTRACTION W/ INTRAOCULAR LENS  IMPLANT, BILATERAL Bilateral ~ 2012  . DILATION AND CURETTAGE OF UTERUS    . LUMBAR DISC SURGERY  1970's  . LYMPH NODE DISSECTION  2005   "all my lymph nodes under breasts, went thru my back; Dr. Arlyce Dice did it"  . Needle aspiration Pelvic cyst  2011   Dr Diona Fanti  . VIDEO ASSISTED THORACOSCOPY (VATS)/THOROCOTOMY Right 09/2003   thoracotomy, right upper lobectomy with node dissection; Dr Demetrios Loll 11/02/2010  . VIDEO BRONCHOSCOPY  03/2004   Archie Endo 11/02/2010     Vanessa Kick, MD 06/24/17 1355

## 2017-06-26 IMAGING — DX DG HIP (WITH OR WITHOUT PELVIS) 2V BILAT
5 series · 5 of 5 positions shown · non-contrast
Comparison: CT Abdomen and Pelvis 05/23/2014.

CLINICAL DATA: 82-year-old female with recent falls. Bilateral hip
pain. Initial encounter.

EXAM:
DG HIP (WITH OR WITHOUT PELVIS) 2V BILAT

[pelvis ap]
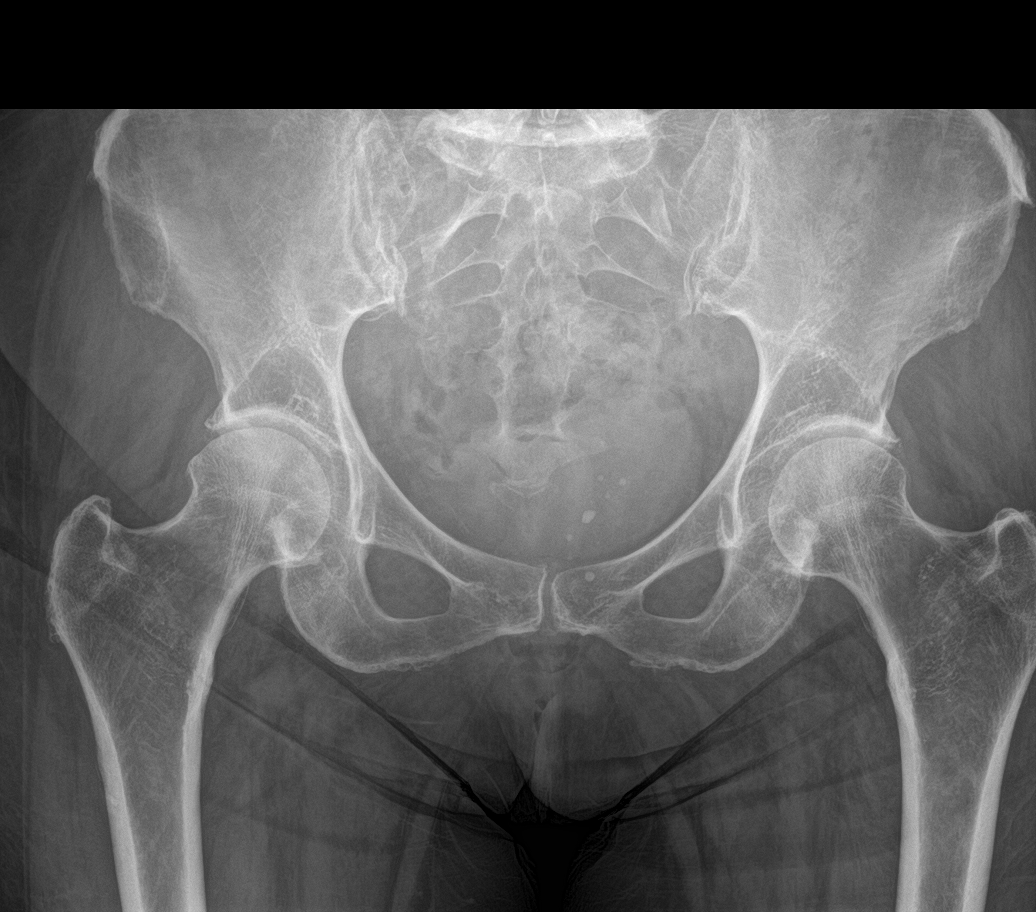

[hip ap (1 of 2)]
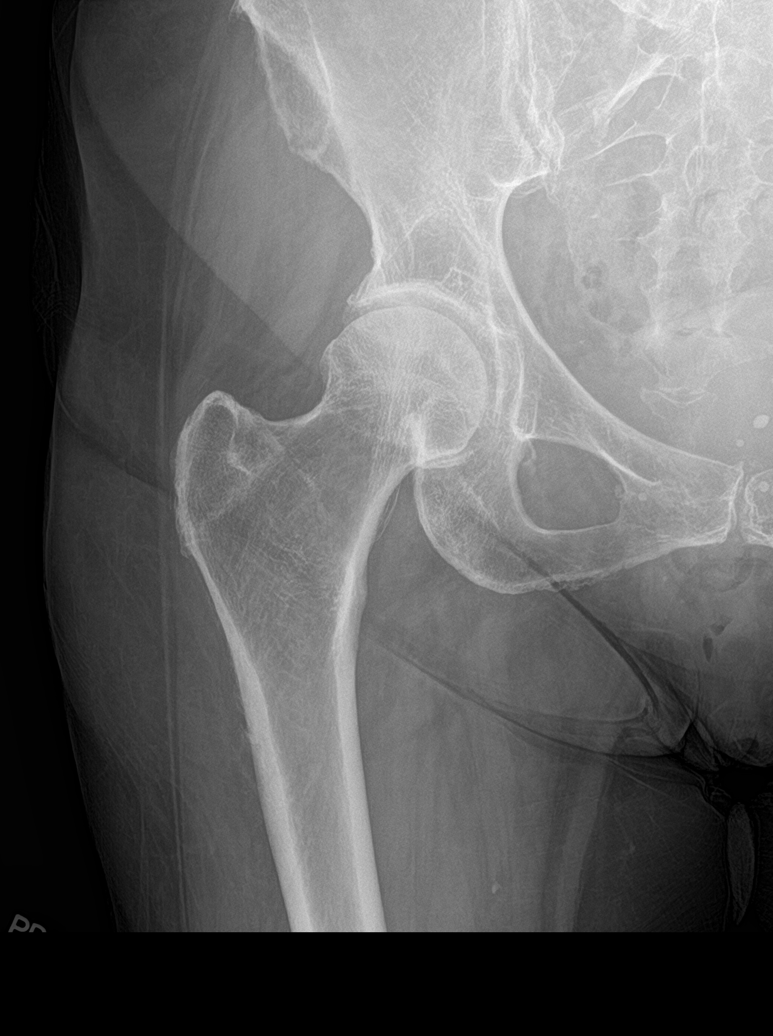

[hip lat (1 of 2)]
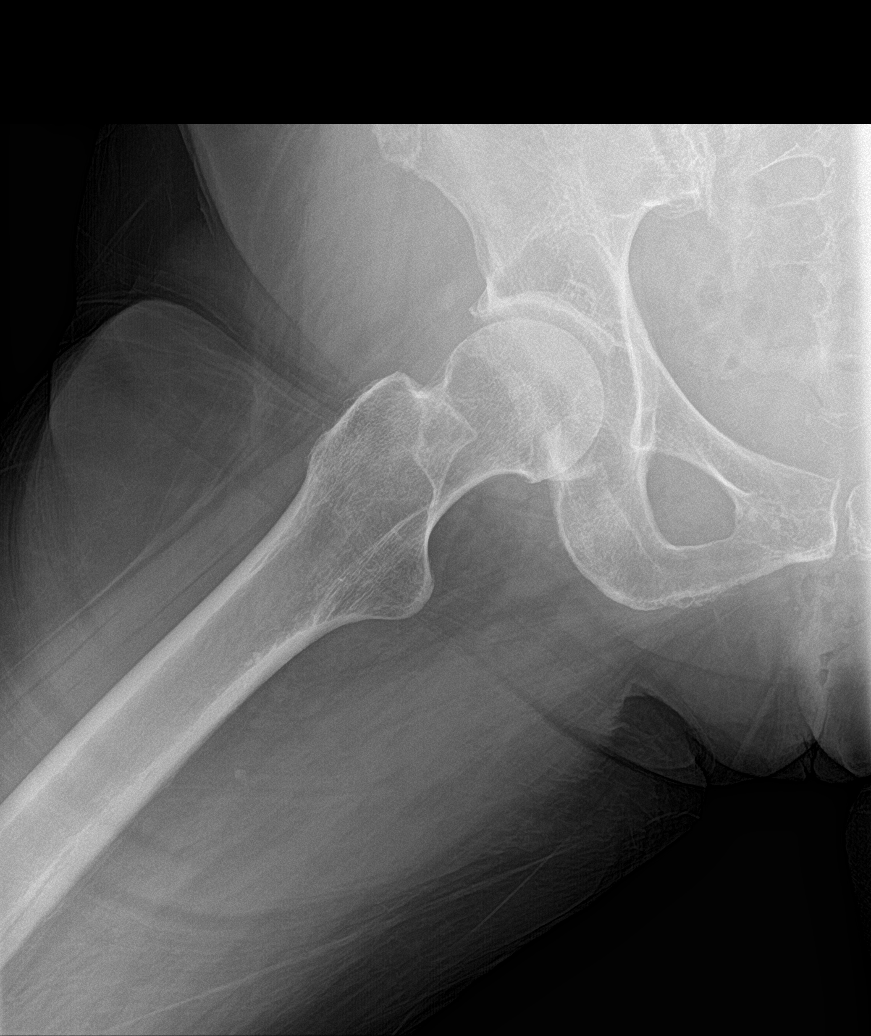

[hip ap (2 of 2)]
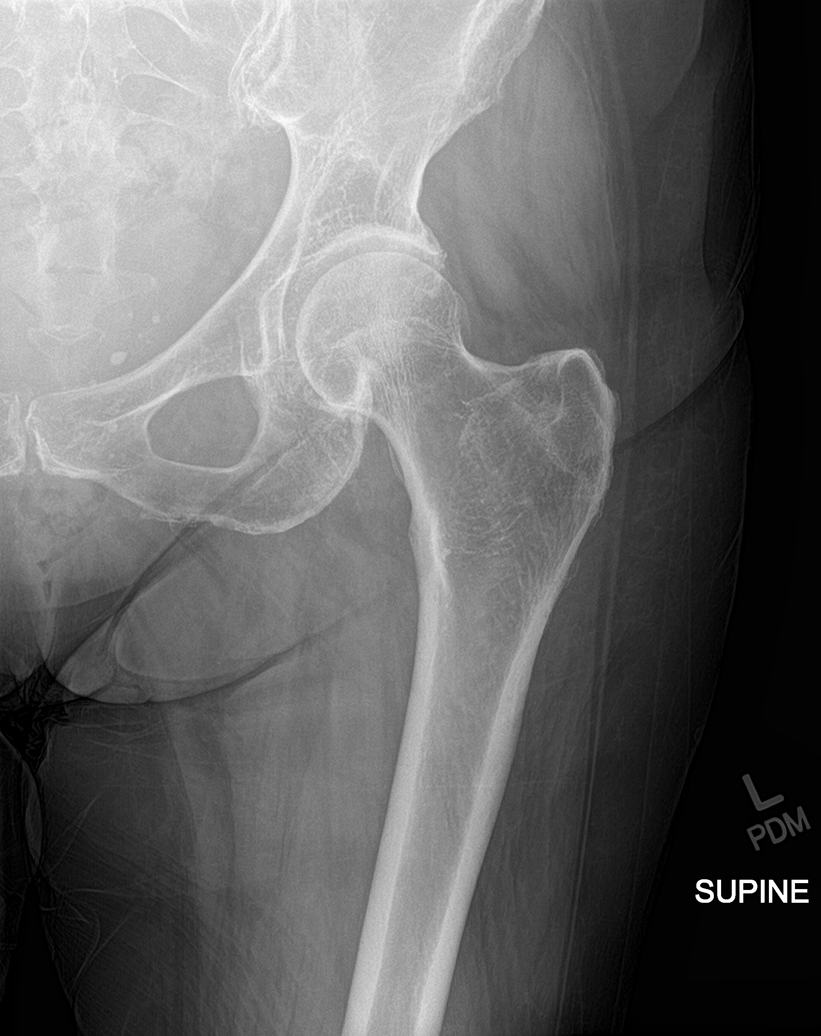

[hip lat (2 of 2)]
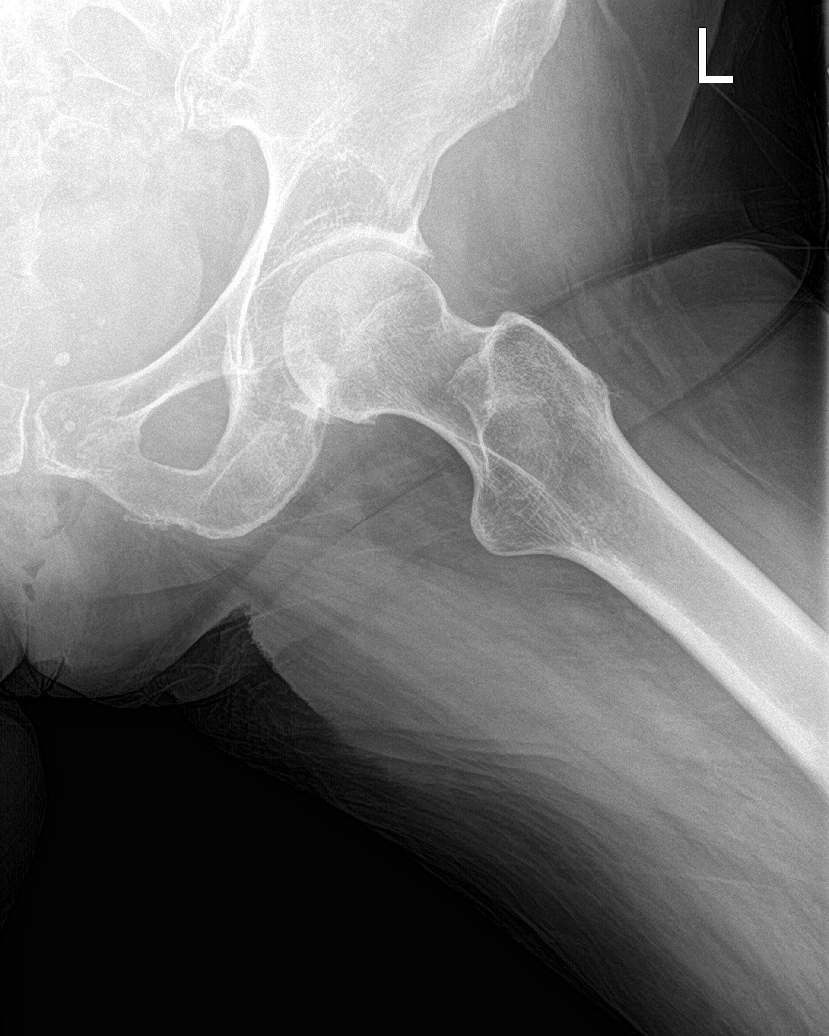

[5 of 5 positions shown; findings below may reference images not displayed]

FINDINGS: Moderate to severe lower lumbar disc space degeneration with some
endplate spurring re- demonstrated. Bone mineralization is within
normal limits for age. Pelvis intact. Sacral ala and SI joints
appear normal. Femoral heads normally located. Hip joint spaces are
normal for age. Proximal right femur intact. Proximal left femur
intact. Small pelvic phleboliths re- demonstrated. Negative visible
bowel gas pattern.
IMPRESSION: No acute fracture or dislocation identified about the bilateral hips
or pelvis.

## 2017-06-27 ENCOUNTER — Telehealth: Payer: Self-pay | Admitting: Internal Medicine

## 2017-06-27 NOTE — Telephone Encounter (Signed)
noted 

## 2017-06-27 NOTE — Telephone Encounter (Signed)
Copied from [place] [phone]. Topic: Quick Communication - See Telephone Encounter >> Jun 27, 2017 10:30 AM Bea Graff, NT wrote: Reason for CRM: Pt states she was unable to do PT today because she has been sick and not been sleeping well since being prednisone on Sunday due to swelling on the side of her jaw that she was seen in urgent care on Saturday. She asked the PT guy to call her back in a few days to set up her first PT appt. She is gettingbetter but no energy due to lack of sleep.

## 2017-06-28 ENCOUNTER — Telehealth: Payer: Self-pay | Admitting: Internal Medicine

## 2017-06-28 NOTE — Telephone Encounter (Signed)
Copied from [place] [id number]. Topic: Quick Communication - See Telephone Encounter >> Jun 28, 2017  4:29 PM Boyd Kerbs wrote: CRM for notification. See Telephone encounter for:   Patient requested that [facility] come on Friday. Shanon Brow from [place] wanted to let doctor know 06/28/17.

## 2017-06-29 ENCOUNTER — Encounter (HOSPITAL_COMMUNITY): Payer: Self-pay

## 2017-06-29 ENCOUNTER — Telehealth: Payer: Self-pay | Admitting: Internal Medicine

## 2017-06-29 ENCOUNTER — Emergency Department (HOSPITAL_COMMUNITY)
Admission: EM | Admit: 2017-06-29 | Discharge: 2017-06-30 | Disposition: A | Discharge status: Home / Self Care | Payer: PPO | Attending: Emergency Medicine | Admitting: Emergency Medicine

## 2017-06-29 ENCOUNTER — Emergency Department (HOSPITAL_COMMUNITY): Payer: PPO

## 2017-06-29 ENCOUNTER — Other Ambulatory Visit: Payer: Self-pay

## 2017-06-29 DIAGNOSIS — J45909 Unspecified asthma, uncomplicated: Secondary | ICD-10-CM | POA: Diagnosis not present

## 2017-06-29 DIAGNOSIS — J42 Unspecified chronic bronchitis: Secondary | ICD-10-CM | POA: Diagnosis not present

## 2017-06-29 DIAGNOSIS — R0602 Shortness of breath: Secondary | ICD-10-CM

## 2017-06-29 DIAGNOSIS — J209 Acute bronchitis, unspecified: Secondary | ICD-10-CM | POA: Diagnosis not present

## 2017-06-29 DIAGNOSIS — Z79899 Other long term (current) drug therapy: Secondary | ICD-10-CM | POA: Insufficient documentation

## 2017-06-29 DIAGNOSIS — E039 Hypothyroidism, unspecified: Secondary | ICD-10-CM | POA: Insufficient documentation

## 2017-06-29 DIAGNOSIS — Z87891 Personal history of nicotine dependence: Secondary | ICD-10-CM | POA: Insufficient documentation

## 2017-06-29 DIAGNOSIS — Z91040 Latex allergy status: Secondary | ICD-10-CM | POA: Diagnosis not present

## 2017-06-29 DIAGNOSIS — R05 Cough: Secondary | ICD-10-CM | POA: Diagnosis not present

## 2017-06-29 DIAGNOSIS — R079 Chest pain, unspecified: Secondary | ICD-10-CM | POA: Diagnosis not present

## 2017-06-29 LAB — I-STAT TROPONIN, ED
TROPONIN I, POC: 0.01 ng/mL (ref 0.00–0.08)
Troponin i, poc: 0 ng/mL (ref 0.00–0.08)

## 2017-06-29 LAB — BASIC METABOLIC PANEL
Anion gap: 8 (ref 5–15)
BUN: 13 mg/dL (ref 6–20)
CHLORIDE: 91 mmol/L — AB (ref 101–111)
CO2: 32 mmol/L (ref 22–32)
Calcium: 9.9 mg/dL (ref 8.9–10.3)
Creatinine, Ser: 0.83 mg/dL (ref 0.44–1.00)
GFR calc Af Amer: >60 mL/min (ref >60)
GFR calc non Af Amer: >60 mL/min (ref >60)
Glucose, Bld: 112 mg/dL — ABNORMAL HIGH (ref 65–99)
POTASSIUM: 4.4 mmol/L (ref 3.5–5.1)
SODIUM: 131 mmol/L — AB (ref 135–145)

## 2017-06-29 LAB — CBC
HCT: 41.4 % (ref 36.0–46.0)
Hemoglobin: 13.1 g/dL (ref 12.0–15.0)
MCH: 31.9 pg (ref 26.0–34.0)
MCHC: 31.6 g/dL (ref 30.0–36.0)
MCV: 100.7 fL — ABNORMAL HIGH (ref 78.0–100.0)
Platelets: 294 10*3/uL (ref 150–400)
RBC: 4.11 MIL/uL (ref 3.87–5.11)
RDW: 13.5 % (ref 11.5–15.5)
WBC: 10.6 10*3/uL — AB (ref 4.0–10.5)

## 2017-06-29 MED ORDER — ALBUTEROL SULFATE (2.5 MG/3ML) 0.083% IN NEBU
5.0000 mg | INHALATION_SOLUTION | Freq: Once | RESPIRATORY_TRACT | Status: AC
  Administered 2017-06-29: 5 mg via RESPIRATORY_TRACT
  Filled 2017-06-29: qty 6

## 2017-06-29 MED ORDER — ALBUTEROL SULFATE (2.5 MG/3ML) 0.083% IN NEBU
2.5000 mg | INHALATION_SOLUTION | Freq: Once | RESPIRATORY_TRACT | Status: AC
Start: 2017-06-29 — End: 2017-06-29
  Administered 2017-06-29: 2.5 mg via RESPIRATORY_TRACT
  Filled 2017-06-29: qty 3

## 2017-06-29 NOTE — Telephone Encounter (Signed)
Spoke with pt who verified that she has been using neb bid and it is not improving s/s.  Pt aware of CY's recs as there are no availabilities in office today.  Pt expressed understanding.  Nothing further needed.

## 2017-06-29 NOTE — Telephone Encounter (Signed)
Pt c/o increased SOB, chest tightness X4-5 days.  Pt denies fever, cough, mucus production, chills, fever, muscle aches.  Pt was given pred taper on Sunday at [facility] for an unrelated issue which is not improving dyspnea.   O2 sats are dropping with exertion- dropped to 70's% with exertion.  Pt wears 3lpm.    Pt is not taking anything to help with s/s.  Requesting additional recs.    Pt uses [facility] on [address]   CY please advise on further recs.  Thanks.

## 2017-06-29 NOTE — ED Triage Notes (Signed)
PT reports she normally wears 3LPM [place] but today she became very short of breath when walking to bathroom and started experiencing chest pain. PT checked SPO2 and it read 71%. Pt states Nebs at home haven't been working and she is not able to cough anything up but needs to. Wheezing audible in triage.

## 2017-06-29 NOTE — Telephone Encounter (Signed)
noted 

## 2017-06-29 NOTE — Telephone Encounter (Signed)
She has a nebulizer, does that help? Can we get her in with an NP today or tomorrow? Otherwise she should go to ER.

## 2017-06-29 NOTE — ED Provider Notes (Signed)
[place] EMERGENCY DEPARTMENT Provider Note   CSN: [id number] Arrival date & time: 06/29/17  1512     History   Chief Complaint Chief Complaint  Patient presents with  . Chest Pain  . Shortness of Breath    HPI [name] is a 84 y.o. female with a history of emphysema, COPD, Chronic bronchitis, Non-small cell carcinoma of the lung, who presents today for evaluation of an episode of shortness of breath and chest pain.  She reports that she is normally on 3 L of oxygen at home, and took it off to walk to the bathroom.  She then reports that she got very short of breath, feeling like she could not get any air in her heart was beating very very fast.  She says that she has a pulse ox at home and she was satting at 71 without her oxygen on.  She reports that when she put her oxygen on and sat down she started feeling better.   She denies any fevers or chills.  She reports she has been coughing less than normal and feels like she can't cough up what she needs to.  She reports that the breathing treatment helped her some.  She currently denies chest pain, says her breathing is not at her normal.  She reports that she was started on prednisone on 1/5 for left jaw pain.  She reports that she has been using a vest for her bronchiectasis.  No increased sputum purulence.   HPI  Past Medical History:  Diagnosis Date  . Allergic rhinitis   . Anxiety   . Arthritis    "back, hands; hips" (12/22/2014)  . Asthma   . Bladder atony   . Chronic bronchitis ([place])   . COPD (chronic obstructive pulmonary disease) ([place])   . Diarrhea   . Emphysema of lung ([place])   . GERD (gastroesophageal reflux disease)    "w/spicey foods" (12/22/2014)  . Hyperlipidemia   . Non-small cell carcinoma of lung, stage 1 ([place]) 2005   1.4 cm Poorly differentiated Squamous cell RUL  T1N0 resected 09/22/2003  . On home oxygen therapy    "3L q night and prn during the day" (12/22/2014)  . Pelvic cyst   .  Pneumonia    "this is the 3rd time that I can remember" (12/22/2014)  . Unspecified hypothyroidism     Patient Active Problem List   Diagnosis Date Noted  . Right-sided chest pain 06/02/2017  . Osteopenia 02/16/2017  . Hyperglycemia 12/08/2016  . Degenerative arthritis of right knee 10/27/2016  . 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 Greater trochanteric bursitis of both hips 04/08/2016  . Pain in both knees 04/08/2016  . Chronic midline low back pain without sciatica 04/08/2016  . Dysuria 12/02/2015  . Hip pain 12/02/2015  . Chronic respiratory failure with hypoxia ([place]) 03/07/2015  . Diverticulitis 05/24/2014  . Anxiety   . Bladder prolapse, female, acquired   . Arthritis   . Non-small cell carcinoma of lung, stage 1 ([place]) 01/10/2012  . Neuropathy, peripheral 01/28/2011  . COPD mixed type ([place]) 09/08/2010  . Dyslipidemia   . ATONY OF BLADDER 08/10/2009  . Hypothyroidism 03/12/2008  . Seasonal allergic rhinitis 10/10/2007    Past Surgical History:  Procedure Laterality Date  . ABDOMINAL HYSTERECTOMY  ~ 1976  . BACK SURGERY    . CATARACT EXTRACTION W/ INTRAOCULAR LENS  IMPLANT, BILATERAL Bilateral ~ 2012  . DILATION AND CURETTAGE OF  UTERUS    . LUMBAR DISC SURGERY  1970's  . LYMPH NODE DISSECTION  2005   "all my lymph nodes under breasts, went thru my back; Dr. Arlyce Dice did it"  . Needle aspiration Pelvic cyst  2011   Dr Diona Fanti  . VIDEO ASSISTED THORACOSCOPY (VATS)/THOROCOTOMY Right 09/2003   thoracotomy, right upper lobectomy with node dissection; Dr Demetrios Loll 11/02/2010  . VIDEO BRONCHOSCOPY  03/2004   Archie Endo 11/02/2010    OB History    No data available       Home Medications    Prior to Admission medications   Medication Sig Start Date End Date Taking? Authorizing Provider  acetaminophen (TYLENOL ARTHRITIS PAIN) 650 MG CR tablet Take 650 mg by mouth daily as needed for pain.    Yes [provider]  albuterol  (PROVENTIL HFA;VENTOLIN HFA) 108 (90 Base) MCG/ACT inhaler Inhale 2 puffs into the lungs every 6 (six) hours as needed for wheezing or shortness of breath. 12/09/16  Yes Burns, Claudina Lick, MD  ALPRAZolam Duanne Moron) 1 MG tablet TAKE 1 TABLET EVERY EVENING OR AT [facility] 06/19/17  Yes Burns, Claudina Lick, MD  aspirin EC 81 MG tablet Take 81 mg by mouth daily as needed for mild pain.    Yes [provider]  atorvastatin (LIPITOR) 20 MG tablet TAKE 1 TABLET (20 MG TOTAL) BY MOUTH DAILY. Patient taking differently: Take 20 mg by mouth 3 (three) times a week. Mon/Wed/Fri 02/03/17  Yes Burns, Claudina Lick, MD  Cholecalciferol (VITAMIN D) 2000 UNITS tablet Take 1 tablet (2,000 Units total) by mouth daily. 04/29/15  Yes Rowe Clack, MD  fluticasone (FLOVENT HFA) 110 MCG/ACT inhaler Inhale 2 puffs into the lungs 2 (two) times daily. 09/01/16  Yes Young, Tarri Fuller D, MD  ibuprofen (ADVIL,MOTRIN) 200 MG tablet Take 400 mg by mouth every 6 (six) hours as needed for mild pain.   Yes [provider]  levalbuterol (XOPENEX) 1.25 MG/3ML nebulizer solution Take 1.25 mg by nebulization every 8 (eight) hours as needed for wheezing. Patient taking differently: Take 1.25 mg by nebulization 2 (two) times daily.  08/16/16  Yes Young, Tarri Fuller D, MD  levothyroxine (SYNTHROID, LEVOTHROID) 88 MCG tablet TAKE 1 TABLET BY MOUTH EVERY DAY 06/05/17  Yes Burns, Claudina Lick, MD  Multiple Vitamins-Minerals (CENTRUM SILVER PO) Take 1 tablet by mouth daily.    Yes [provider]  predniSONE (STERAPRED UNI-PAK 21 TAB) 10 MG (21) TBPK tablet Take as directed. 06/24/17  Yes Vanessa Kick, MD  Probiotic Product (PROBIOTIC PO) Take 1 capsule by mouth daily.   Yes [provider]  azithromycin (ZITHROMAX) 500 MG tablet Take 1 tablet (500 mg total) by mouth daily for 2 days. Take first 2 tablets together, then 1 every day until finished. 06/30/17 07/02/17  Lorin Glass, PA-C  gabapentin (NEURONTIN) 100 MG capsule Take 2  capsules (200 mg total) by mouth at bedtime. Patient not taking: Reported on 06/29/2017 06/12/17   Marrian Salvage, FNP  ondansetron (ZOFRAN ODT) 4 MG disintegrating tablet Take 1 tablet (4 mg total) by mouth every 8 (eight) hours as needed for nausea or vomiting. 06/30/17   Lorin Glass, PA-C  Respiratory Therapy Supplies (FLUTTER) DEVI Blow through 4 times per cycle and repeat 3 cycles per day 10/15/13   Deneise Lever, MD    Family History Family History  Problem Relation Age of Onset  . Heart disease Father   . Rheumatologic disease Brother   . Cancer Brother  Leukemia  . Rheumatologic disease Sister   . Breast cancer Daughter     Social History Social History   Tobacco Use  . Smoking status: Former Smoker    Packs/day: 1.00    Years: 42.00    Pack years: 42.00    Types: Cigarettes    Last attempt to quit: 09/22/2003    Years since quitting: 13.7  . Smokeless tobacco: Never Used  Substance Use Topics  . Alcohol use: Yes    Alcohol/week: 8.4 oz    Types: 14 Glasses of wine per week    Comment: 12/22/2014 "I have a large glass of wine qd"  . Drug use: No     Allergies   Ampicillin; Azithromycin; Codeine; Daliresp [roflumilast]; Nitrofurantoin; Other; Penicillins; Sulfonamide derivatives; Tiotropium bromide monohydrate; Doxycycline; Fludrocortisone acetate; Lactose intolerance (gi); Latex; and Prednisone   Review of Systems Review of Systems  Constitutional: Negative for chills and fever.  HENT: Negative for ear pain and sore throat.   Eyes: Negative for pain and visual disturbance.  Respiratory: Positive for cough and shortness of breath.        "Rattle"  Cardiovascular: Negative for chest pain and palpitations.  Gastrointestinal: Negative for abdominal pain and vomiting.  Genitourinary: Negative for dysuria and hematuria.  Musculoskeletal: Negative for arthralgias and back pain.       Left jaw pain  Skin: Negative for color change and rash.    Neurological: Negative for seizures and syncope.  Psychiatric/Behavioral: Positive for sleep disturbance ("the presnisone makes me not sleep well.").  All other systems reviewed and are negative.    Physical Exam Updated Vital Signs BP (!) 156/78   Pulse 94   Temp 98.3 F (36.8 C) (Oral)   Resp 19   Ht 5\' 5"  (1.651 m)   Wt 66.7 kg (147 lb)   SpO2 96%   BMI 24.46 kg/m   Physical Exam  Constitutional: She is oriented to person, place, and time. She appears well-developed and well-nourished. No distress.  HENT:  Head: Normocephalic and atraumatic.  Eyes: Conjunctivae are normal.  Neck: Normal range of motion. Neck supple. No JVD present.  Cardiovascular: Regular rhythm. Tachycardia present.  No murmur heard. Pulses:      Radial pulses are 2+ on the right side, and 2+ on the left side.  Pulmonary/Chest: Effort normal. No respiratory distress. She has wheezes in the right upper field, the right middle field, the right lower field, the left upper field, the left middle field and the left lower field. She has rhonchi in the right upper field, the right middle field, the right lower field, the left upper field, the left middle field and the left lower field.  Abdominal: Soft. There is no tenderness.  Musculoskeletal: She exhibits no edema.  Neurological: She is alert and oriented to person, place, and time.  Skin: Skin is warm and dry.  Psychiatric: She has a normal mood and affect.  Nursing note and vitals reviewed.    ED Treatments / Results  Labs (all labs ordered are listed, but only abnormal results are displayed) Labs Reviewed  BASIC METABOLIC PANEL - Abnormal; Notable for the following components:      Result Value   Sodium 131 (*)    Chloride 91 (*)    Glucose, Bld 112 (*)    All other components within normal limits  CBC - Abnormal; Notable for the following components:   WBC 10.6 (*)    MCV 100.7 (*)    All other  components within normal limits  I-STAT  TROPONIN, ED  I-STAT TROPONIN, ED    EKG  EKG Interpretation None       Radiology Dg Chest 2 View  Result Date: 06/29/2017 CLINICAL DATA:  PT reports she normally wears 3LPM [place] but today she became very short of breath when walking to bathroom and started experiencing chest pain. PT checked SPO2 and it read 71%. EXAM: CHEST  2 VIEW COMPARISON:  06/02/2017 FINDINGS: Lungs are hyperinflated. There is stable opacity at the right lung base consistent with pleural effusion or pleural thickening and scarring. No focal consolidations or pulmonary edema. IMPRESSION: 1. Hyperinflation and stable right basilar changes. 2.  No evidence for acute pulmonary abnormality. Electronically Signed   By: Nolon Nations M.D.   On: 06/29/2017 16:40    Procedures Procedures (including critical care time)  Medications Ordered in ED Medications  albuterol (PROVENTIL) (2.5 MG/3ML) 0.083% nebulizer solution 5 mg (5 mg Nebulization Given 06/29/17 1539)  albuterol (PROVENTIL) (2.5 MG/3ML) 0.083% nebulizer solution 2.5 mg (2.5 mg Nebulization Given 06/29/17 2323)     Initial Impression / Assessment and Plan / ED Course  I have reviewed the triage vital signs and the nursing notes.  Pertinent labs & imaging results that were available during my care of the patient were reviewed by me and considered in my medical decision making (see chart for details).  Clinical Course as of Jun 30 2201  Thu Jun 29, 2017  2341 Patient re-evaluated, lung sounds improved.   [EH]    Clinical Course User Index [EH] Lorin Glass, PA-C   Cecille Amsterdam Bazzano presents today for evaluation of shortness of breath and chest pain that occurred after she walked to the bathroom without her oxygen.  At the time of evaluation her chest pain was fully resolved without interventions.  She was primarily concerned about her shortness of breath.  CXR obtained, stable from previous.  Troponin x2 normal.  EKG reviewed with out significant  abnormality.  She was treated with two nebulizer treatments which allowed her to cough up sputum causing significant improvement in symptoms.  She was offered admission to trend troponin, further cardiac work up, and CTA was discussed.  PE was considered but low suspicion based on diffuse rhonchi, wheezing that improved after albuterol and her event started when she was not compliant with her oxygen.  Patient feels like her symptoms are related to her COPD.    Patient refused admission, stating she was concerned that if she was admitted she would be stuck in a bed and get pneumonia.  She has good follow up and was given return precautions.  She and her daughter voiced their understandings of the risks of going home with out CTA PE, and admission for continued cardiac work up including disability, worsening condition and death.    Patient was discharged with azithromycin rx, as she is already on prednisone for her jaw pain, and zofran as azithromycin causes her to have GI upset.  Patient discharged home with daughter.   Final Clinical Impressions(s) / ED Diagnoses   Final diagnoses:  Shortness of breath  Acute exacerbation of chronic bronchitis [facility])    ED Discharge Orders        Ordered    ondansetron (ZOFRAN ODT) 4 MG disintegrating tablet  Every 8 hours PRN     06/30/17 0120    azithromycin (ZITHROMAX) 500 MG tablet  Daily     06/30/17 0120       Phylliss Bob,  Ree Shay, PA-C 06/30/17 2204    Varney Biles, MD 07/01/17 1555

## 2017-06-30 ENCOUNTER — Telehealth: Payer: Self-pay | Admitting: Internal Medicine

## 2017-06-30 ENCOUNTER — Telehealth: Payer: Self-pay | Admitting: Emergency Medicine

## 2017-06-30 ENCOUNTER — Telehealth: Payer: Self-pay | Admitting: *Deleted

## 2017-06-30 MED ORDER — ONDANSETRON 4 MG PO TBDP: 4.0000 mg | ORAL_TABLET | Freq: Three times a day (TID) | ORAL | 0 refills | Status: DC | PRN

## 2017-06-30 MED ORDER — AZITHROMYCIN 500 MG PO TABS: 500.0000 mg | ORAL_TABLET | Freq: Every day | ORAL | 0 refills | Status: AC

## 2017-06-30 NOTE — Discharge Instructions (Addendum)
Please wear your oxygen all the time.  Please call your doctor tomorrow for an appointment.  I have given you antibiotics for acute exacerbation of chronic bronchitis along with zofran for nausea.

## 2017-06-30 NOTE — Telephone Encounter (Signed)
FYI CY. Pt went to the hospital and wants to let you know she is doing better.

## 2017-06-30 NOTE — Telephone Encounter (Signed)
Spoke with pt, advised her of appt and nothing further is needed. Appt made.

## 2017-06-30 NOTE — Telephone Encounter (Signed)
Pharmacy called related to Rx: ZITHROMAX dosing .Marland KitchenMarland KitchenEDCM clarified with EDP (LAWYER) to change Rx to: 2 PO DAY 1 THEN 1 PO DAY 2-5 #6.

## 2017-06-30 NOTE — ED Notes (Signed)
While ambulating pt very out of breath but atimant about continuing to walk.

## 2017-06-30 NOTE — Telephone Encounter (Signed)
Copied from [place] [phone]. Topic: Quick Communication - See Telephone Encounter >> Jun 28, 2017  4:29 PM Boyd Kerbs wrote: CRM for notification. See Telephone encounter for:  Patient requested that [facility] come on Friday. Shanon Brow from [place] wanted to let doctor know 06/28/17. >> Jun 30, 2017  9:08 AM Synthia Innocent wrote: Patient still not feeling well, would like to delay till Tuesday for Start of Care, call back to [facility] at [phone]

## 2017-06-30 NOTE — Telephone Encounter (Signed)
Katie please see when we can get her back next couple of weeks for ER visit f/u

## 2017-06-30 NOTE — Telephone Encounter (Signed)
Pt can be seen Monday 07-17-17 at 11:30am for HFU. Thanks.

## 2017-06-30 NOTE — Telephone Encounter (Signed)
Spoke with Shanon Brow, advised it was okay to start care next week.

## 2017-07-04 ENCOUNTER — Telehealth: Payer: Self-pay | Admitting: Internal Medicine

## 2017-07-04 DIAGNOSIS — Z85118 Personal history of other malignant neoplasm of bronchus and lung: Secondary | ICD-10-CM | POA: Diagnosis not present

## 2017-07-04 DIAGNOSIS — G629 Polyneuropathy, unspecified: Secondary | ICD-10-CM | POA: Diagnosis not present

## 2017-07-04 DIAGNOSIS — M48061 Spinal stenosis, lumbar region without neurogenic claudication: Secondary | ICD-10-CM | POA: Diagnosis not present

## 2017-07-04 DIAGNOSIS — Z7982 Long term (current) use of aspirin: Secondary | ICD-10-CM | POA: Diagnosis not present

## 2017-07-04 DIAGNOSIS — M7062 Trochanteric bursitis, left hip: Secondary | ICD-10-CM | POA: Diagnosis not present

## 2017-07-04 DIAGNOSIS — J439 Emphysema, unspecified: Secondary | ICD-10-CM | POA: Diagnosis not present

## 2017-07-04 DIAGNOSIS — F17211 Nicotine dependence, cigarettes, in remission: Secondary | ICD-10-CM | POA: Diagnosis not present

## 2017-07-04 DIAGNOSIS — M1711 Unilateral primary osteoarthritis, right knee: Secondary | ICD-10-CM | POA: Diagnosis not present

## 2017-07-04 DIAGNOSIS — M7061 Trochanteric bursitis, right hip: Secondary | ICD-10-CM | POA: Diagnosis not present

## 2017-07-04 NOTE — Telephone Encounter (Signed)
Copied from [place]. Topic: Inquiry >> Jul 04, 2017  3:18 PM Conception Chancy, NT wrote: Reason for CRM: Shanon Brow Is calling from [facility] and did a PT evaluation per Hulan Saas. Shanon Brow would like verbal orders for  2x a week for 3 weeks, strength, balance, fall prevention  Shanon Brow [phone]

## 2017-07-04 NOTE — Telephone Encounter (Signed)
Verbal order given per Dr. Raeford Razor.

## 2017-07-10 IMAGING — DX DG CHEST 2V
2 series · 2 of 2 positions shown · non-contrast
Comparison: 05/25/2015

CLINICAL DATA: Shortness of breath, wheezing, RIGHT chest wall pain
for 3 days, COPD, history stage I non-small cell lung cancer

EXAM:
CHEST  2 VIEW

[w chest pa]
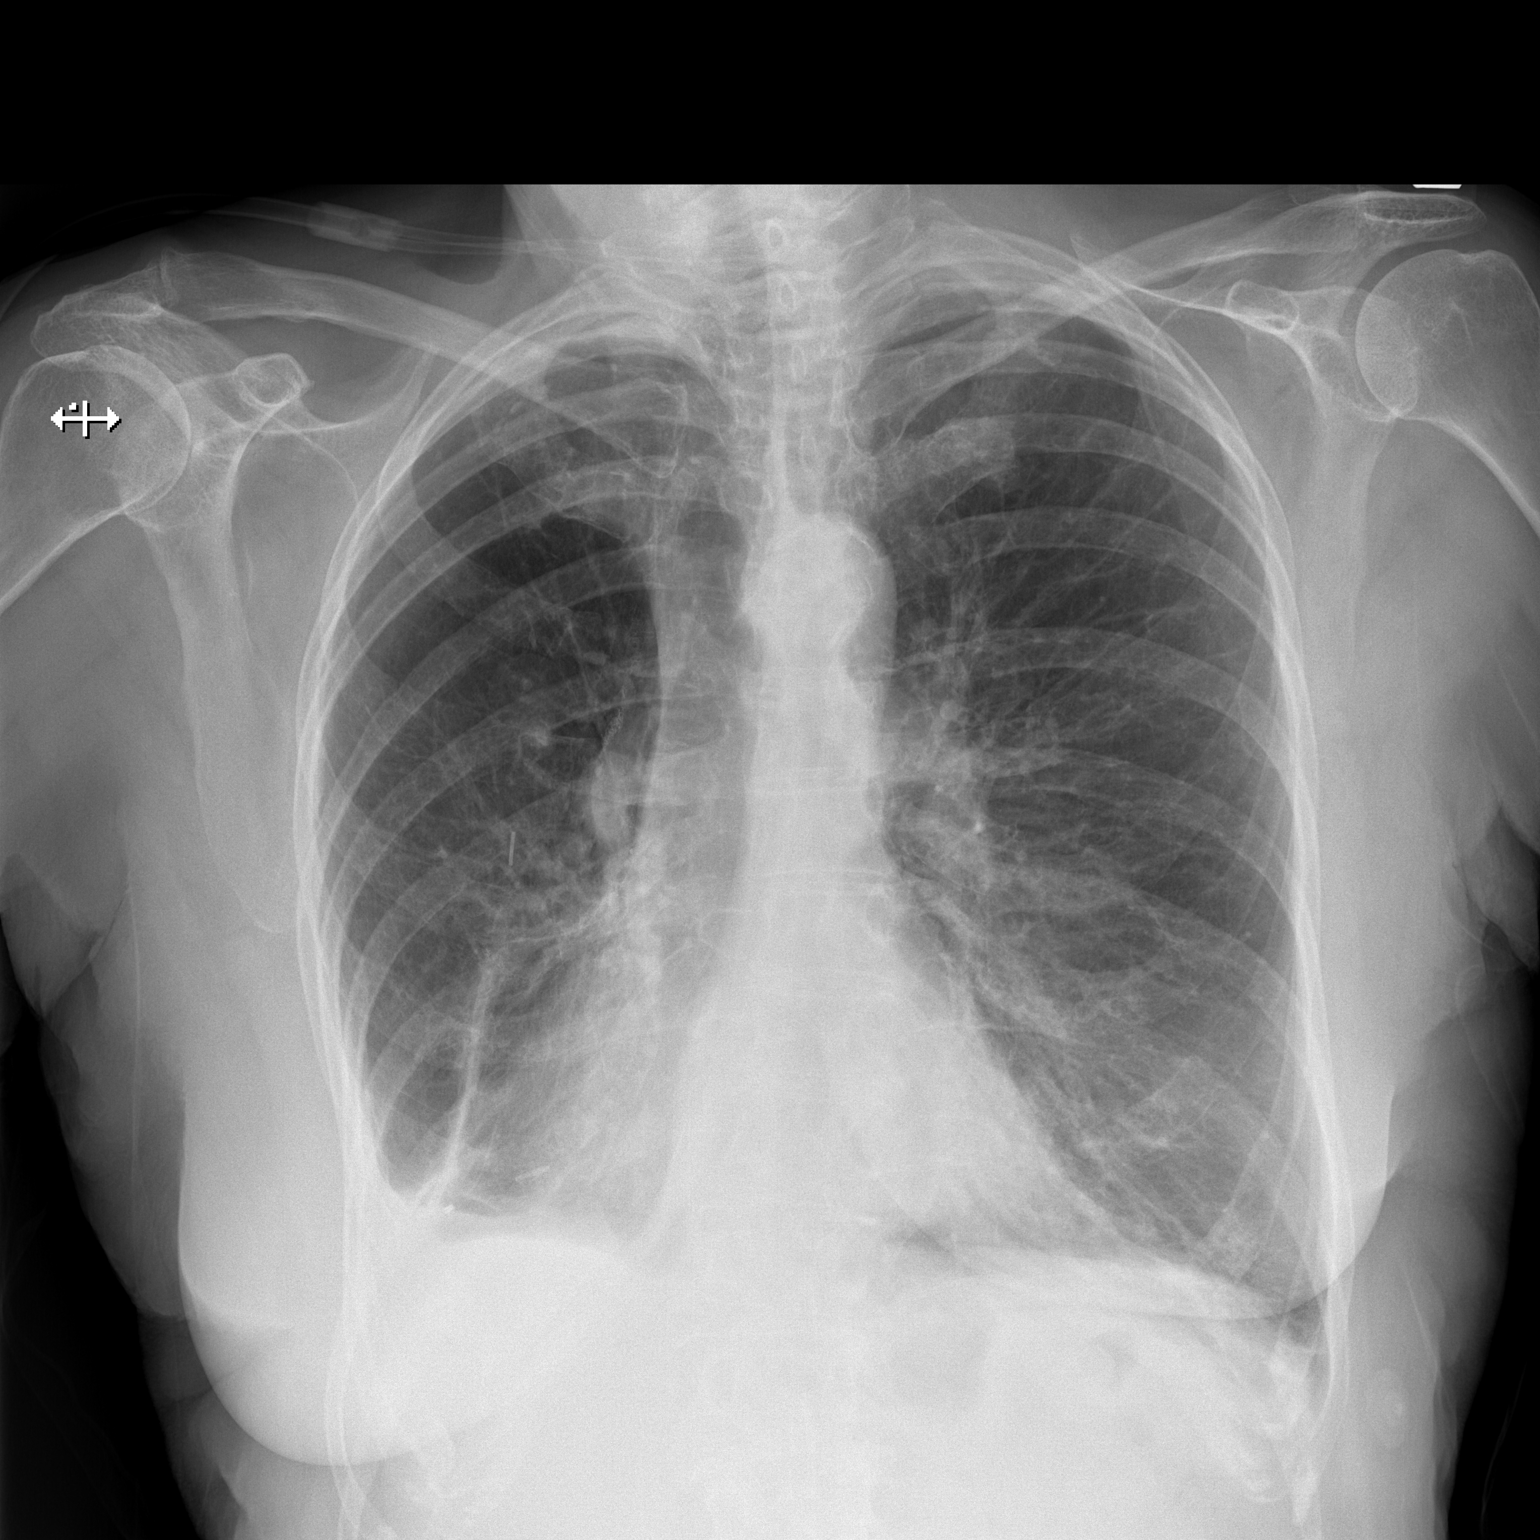

[w chest lat]
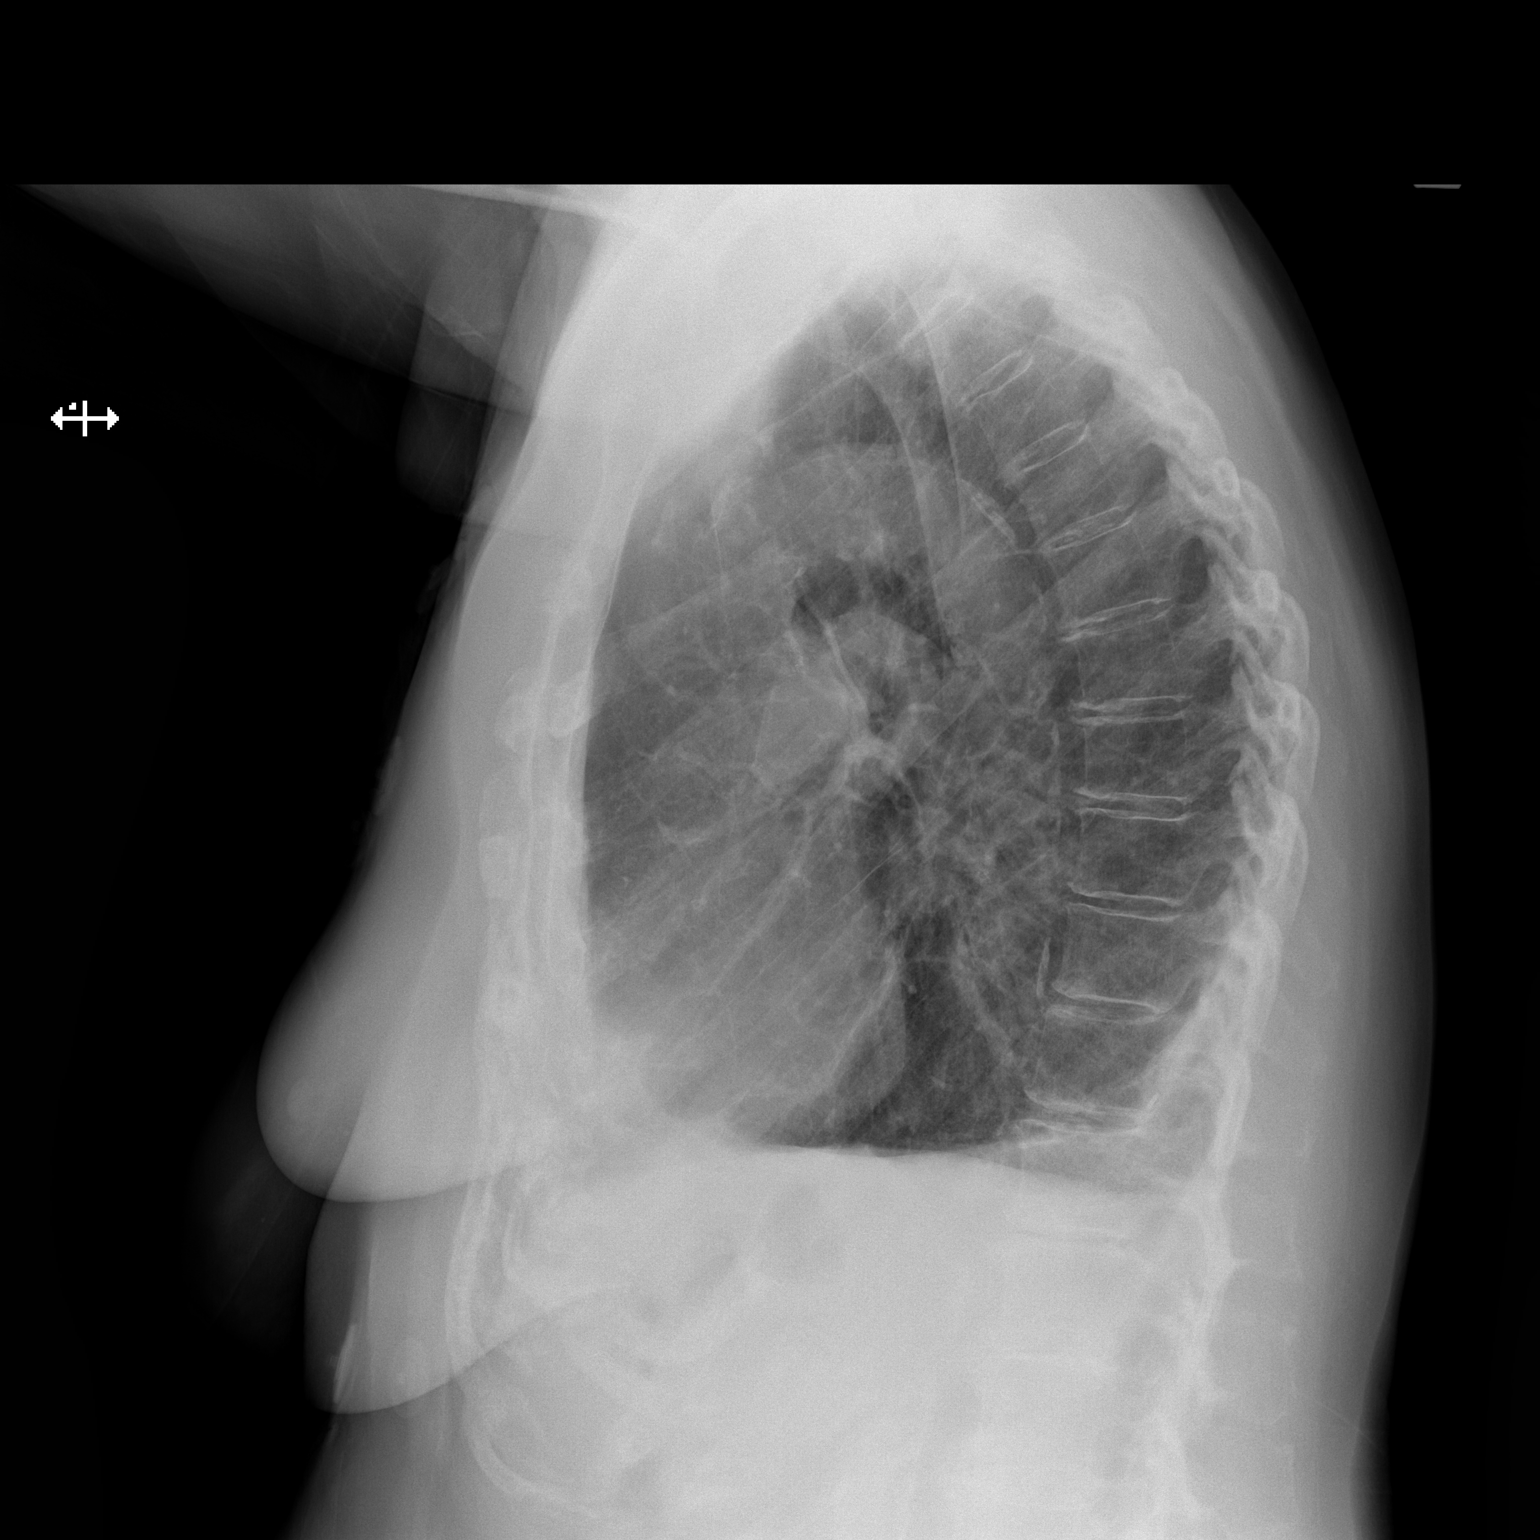

[2 of 2 positions shown; findings below may reference images not displayed]

FINDINGS: Normal heart size, mediastinal contours, and pulmonary vascularity.

Atherosclerotic calcification aorta.

Emphysematous changes compatible with COPD.

Postsurgical changes of the inferior RIGHT lung with scarring and
chronic pleural thickening versus pleural effusion at costophrenic
angle.

Volume loss RIGHT hemithorax with slight mediastinal shift to the
RIGHT.

No acute infiltrate, pleural effusion or pneumothorax.

Question calcified granuloma RIGHT mid lung.

Bones demineralized.
IMPRESSION: Postsurgical changes RIGHT hemithorax.

Underlying COPD changes without evidence of acute infiltrate.

Aortic atherosclerosis.

## 2017-07-17 DIAGNOSIS — J449 Chronic obstructive pulmonary disease, unspecified: Secondary | ICD-10-CM | POA: Diagnosis not present

## 2017-07-17 DIAGNOSIS — J9611 Chronic respiratory failure with hypoxia: Secondary | ICD-10-CM | POA: Diagnosis not present

## 2017-07-21 DIAGNOSIS — Z7982 Long term (current) use of aspirin: Secondary | ICD-10-CM | POA: Diagnosis not present

## 2017-07-21 DIAGNOSIS — F17211 Nicotine dependence, cigarettes, in remission: Secondary | ICD-10-CM | POA: Diagnosis not present

## 2017-07-21 DIAGNOSIS — J439 Emphysema, unspecified: Secondary | ICD-10-CM | POA: Diagnosis not present

## 2017-07-21 DIAGNOSIS — M7062 Trochanteric bursitis, left hip: Secondary | ICD-10-CM | POA: Diagnosis not present

## 2017-07-21 DIAGNOSIS — Z85118 Personal history of other malignant neoplasm of bronchus and lung: Secondary | ICD-10-CM | POA: Diagnosis not present

## 2017-07-21 DIAGNOSIS — M1711 Unilateral primary osteoarthritis, right knee: Secondary | ICD-10-CM | POA: Diagnosis not present

## 2017-07-21 DIAGNOSIS — M48061 Spinal stenosis, lumbar region without neurogenic claudication: Secondary | ICD-10-CM | POA: Diagnosis not present

## 2017-07-21 DIAGNOSIS — G629 Polyneuropathy, unspecified: Secondary | ICD-10-CM | POA: Diagnosis not present

## 2017-07-21 DIAGNOSIS — M7061 Trochanteric bursitis, right hip: Secondary | ICD-10-CM | POA: Diagnosis not present

## 2017-08-04 ENCOUNTER — Telehealth: Payer: Self-pay | Admitting: Family Medicine

## 2017-08-04 NOTE — Telephone Encounter (Signed)
Copied from [place]. Topic: Quick Communication - See Telephone Encounter >> Aug 04, 2017 10:07 AM Bennye Alm wrote: CRM for notification. See Telephone encounter for:   Patient told Shanon Brow, Physical Therapist with [facility], that she had a fall on Monday. He is requesting an extension of Physical Therapy Home Health Orders once for one week. Please advise  08/04/17.

## 2017-08-04 NOTE — Telephone Encounter (Signed)
Spoke with Shanon Brow to give verbal orders to extend PT per MD

## 2017-08-04 NOTE — Telephone Encounter (Signed)
Ok to extend - do they need an order?

## 2017-08-10 ENCOUNTER — Other Ambulatory Visit: Payer: Self-pay | Admitting: Internal Medicine

## 2017-08-17 DIAGNOSIS — J449 Chronic obstructive pulmonary disease, unspecified: Secondary | ICD-10-CM | POA: Diagnosis not present

## 2017-08-17 DIAGNOSIS — J9611 Chronic respiratory failure with hypoxia: Secondary | ICD-10-CM | POA: Diagnosis not present

## 2017-08-18 ENCOUNTER — Other Ambulatory Visit: Payer: Self-pay | Admitting: Internal Medicine

## 2017-08-18 NOTE — Telephone Encounter (Signed)
Check [place] registry last filled 07/18/2017.Marland KitchenJohny Chess

## 2017-08-21 ENCOUNTER — Ambulatory Visit (INDEPENDENT_AMBULATORY_CARE_PROVIDER_SITE_OTHER): Payer: PPO | Admitting: Internal Medicine

## 2017-08-21 ENCOUNTER — Encounter: Payer: Self-pay | Admitting: Internal Medicine

## 2017-08-21 ENCOUNTER — Telehealth: Payer: Self-pay | Admitting: Internal Medicine

## 2017-08-21 DIAGNOSIS — J9611 Chronic respiratory failure with hypoxia: Secondary | ICD-10-CM | POA: Diagnosis not present

## 2017-08-21 DIAGNOSIS — J449 Chronic obstructive pulmonary disease, unspecified: Secondary | ICD-10-CM

## 2017-08-21 NOTE — Progress Notes (Signed)
Patient ID: [name], female    DOB: 1932/10/11, 84 y.o.   MRN: [id number]  HPI female former smoker followed for history of right upper lobe resection/NSSCA, COPD, chronic hypoxic respiratory failure, allergic rhinitis, complicated by glaucoma Oximetry walk test 07/06/2015-desaturation to 85% while walking on room air. Saturation sustained at 97% on 2 L prongs  ----------------------------------------------------------------------------- .  02/09/17- 84 year old female former smoker followed for history of right upper lobe resection/NSSCA, COPD, chronic hypoxic respiratory failure, allergic rhinitis, complicated by glaucoma O2 3 L/[employer]  requalified today Activity limited by hip pain. Requests flu shot. FOLLOWS FOR:Pt continues to wear O2 24/7. Doing pretty good with her breathing however she is having increased hip pain.  Albuterol HFA and Flovent HFA. Uses Xopenex by nebulizer. She says her breathing is good. She needed to be brought up in a wheelchair because of hip pain. Occasional coughing spells, usually dry. She still feels her tendency to retain secretions unless she works to cough her airways clear. Flutter device helps only "some". Nebulizer treatments help. No recent fever or purulent sputum. CXR 12/26/16 IMPRESSION: No acute disease. Postoperative change right chest. COPD. Atherosclerosis.  08/21/17- 84 year old female former smoker followed for history of right upper lobe resection/NSSCA, COPD, chronic hypoxic respiratory failure, allergic rhinitis, complicated by glaucoma O2 3 L/[employer]  requalified today ----COPD; Recent ED visit. Pt continues to cough-productive-clear in color.  ED visit 06/29/17 for COPD exacerbation Neb Xopenex,Flovent hfa 110, albuterol hfa, pneumatic VEST too painful ([facility]) Here with husband and daughter.  Continues daily cough with deep rattle, frequently productive of white sputum.  No blood, purulent sputum, fever or acute infection.   Using nebulizer once or twice daily.  Thinks Flovent inhaler makes her cough but she is unclear about the names of her medicines. Tried pneumatic vest but even with refitting, it hurts old thoracotomy scars.  Doing better with a belt attached to the machine, instead of the vest but she is not sure it works as well. CXR 06/29/17- IMPRESSION: 1. Hyperinflation and stable right basilar changes. 2.  No evidence for acute pulmonary abnormality.  ROS-see HPI  "+" = pos Constitutional:    weight loss, night sweats, fevers, chills, fatigue, lassitude. HEENT:    headaches, difficulty swallowing, tooth/dental problems, sore throat,       sneezing, itching, ear ache, nasal congestion, post nasal drip, snoring CV:     chest pain, orthopnea, PND, swelling in lower extremities, anasarca,                                                        dizziness, palpitations Resp:   + shortness of breath with exertion or at rest.               + productive cough,   non-productive cough, coughing up of blood.              change in color of mucus.  wheezing.   Skin:    rash or lesions. GI:  No-   heartburn, indigestion, abdominal pain, nausea, vomiting,  GU:  MS:   + joint pain, stiffness, . Neuro-     nothing unusual Psych:  change in mood or affect.  depression or anxiety.   memory loss.   Objective:   OBJ- Physical Exam General- Alert, Oriented, Affect-appropriate, Distress-  none acute, + Wearing O2 3 L pulse + wheelchair Skin- rash-none, lesions- none, excoriation- none Lymphadenopathy- none Head- atraumatic            Eyes- Gross vision intact, PERRLA, conjunctivae and secretions clear            Ears- Hearing, canals-normal            Nose- Clear, no-Septal dev, mucus, polyps, erosion, perforation             Throat- Mallampati II , mucosa clear , drainage- none, tonsils- atrophic Neck- flexible , trachea midline, no stridor , thyroid nl, carotid no bruit Chest - symmetrical excursion , unlabored            Heart/CV- RRR , no murmur , no gallop  , no rub, nl s1 s2                           - JVD- none , edema- none, stasis changes- none, varices- none           Lung-  + Coarse wet rhonchi, cough+ , dullness-none, rub- none,  wheeze-none           Chest wall- + remote right thoracotomy for right  lobectomy Abd-  Br/ Gen/ Rectal- Not done, not indicated Extrem- cyanosis- none, clubbing, none, atrophy- none, strength- nl, +Wheelchair Neuro- grossly intact to observation

## 2017-08-21 NOTE — Assessment & Plan Note (Signed)
Chronic bronchitis/bronchiectasis pattern.  Pneumatic vest hurts her chest wall especially at old surgical scars.  Seems to be doing better with a chest band attached to the machine.  I do not know whether another company would have a vast product that would fit her better.  I have asked her to use the vibrating band regularly for at least a week to see if it makes a difference.  She can also continue using her flutter device.

## 2017-08-21 NOTE — Assessment & Plan Note (Signed)
Depends on oxygen 24/7.  No change indicated.

## 2017-08-21 NOTE — Patient Instructions (Signed)
I suggest you continue using the vibrating Band around your chest for 15 minutes twice daily if you can. You will be the one to decide if this treatment helps to keep the mucus cleared out of your airway tubes.  If it helps, try blowing through the Flutter device twice daily   I hope the warmer weather of Spring will help.  Continue the oxygen  Check your inhalers so you know which brands you are using.

## 2017-08-21 NOTE — Telephone Encounter (Signed)
Noted.  Thank you for letting us know.

## 2017-09-05 ENCOUNTER — Other Ambulatory Visit: Payer: Self-pay | Admitting: Internal Medicine

## 2017-09-14 DIAGNOSIS — J9611 Chronic respiratory failure with hypoxia: Secondary | ICD-10-CM | POA: Diagnosis not present

## 2017-09-14 DIAGNOSIS — J449 Chronic obstructive pulmonary disease, unspecified: Secondary | ICD-10-CM | POA: Diagnosis not present

## 2017-10-09 DIAGNOSIS — J479 Bronchiectasis, uncomplicated: Secondary | ICD-10-CM | POA: Diagnosis not present

## 2017-10-15 DIAGNOSIS — J9611 Chronic respiratory failure with hypoxia: Secondary | ICD-10-CM | POA: Diagnosis not present

## 2017-10-15 DIAGNOSIS — J449 Chronic obstructive pulmonary disease, unspecified: Secondary | ICD-10-CM | POA: Diagnosis not present

## 2017-10-17 ENCOUNTER — Ambulatory Visit: Payer: PPO | Admitting: Family Medicine

## 2017-10-17 ENCOUNTER — Encounter: Payer: Self-pay | Admitting: Family Medicine

## 2017-10-17 ENCOUNTER — Ambulatory Visit (INDEPENDENT_AMBULATORY_CARE_PROVIDER_SITE_OTHER): Payer: PPO | Admitting: Family Medicine

## 2017-10-17 ENCOUNTER — Other Ambulatory Visit: Payer: Self-pay | Admitting: Internal Medicine

## 2017-10-17 ENCOUNTER — Ambulatory Visit (INDEPENDENT_AMBULATORY_CARE_PROVIDER_SITE_OTHER)
Admission: RE | Admit: 2017-10-17 | Discharge: 2017-10-17 | Disposition: A | Discharge status: Home / Self Care | Payer: PPO | Source: Ambulatory Visit | Attending: Family Medicine | Admitting: Family Medicine

## 2017-10-17 VITALS — BP 122/64 | HR 89 | Temp 97.7°F | Ht 66.0 in

## 2017-10-17 DIAGNOSIS — M1711 Unilateral primary osteoarthritis, right knee: Secondary | ICD-10-CM | POA: Diagnosis not present

## 2017-10-17 DIAGNOSIS — M7061 Trochanteric bursitis, right hip: Secondary | ICD-10-CM

## 2017-10-17 DIAGNOSIS — M7062 Trochanteric bursitis, left hip: Secondary | ICD-10-CM

## 2017-10-17 NOTE — Telephone Encounter (Signed)
[place] Controlled Substance Database checked. Last filled on 09/17/17

## 2017-10-17 NOTE — Telephone Encounter (Signed)
Check [place] registry last filled 09/17/2017.Marland KitchenJohny Esparza

## 2017-10-17 NOTE — Assessment & Plan Note (Signed)
Pain worsening on the GT. Unable to perform HEP  - GT injection today  - pennsaid  - could consider home health PT

## 2017-10-17 NOTE — Patient Instructions (Signed)
Please try to ice the knee  Please try the exercises I have provided  Please to rub the pennsaid on the areas that hurt.

## 2017-10-17 NOTE — Assessment & Plan Note (Signed)
Having exacerbation of her right knee pain.  Had minimal improvement with previous injection in January. -Injection today.  Will consider gel injections going forward. - counseled on supportive care  - provided Pennsaid

## 2017-10-17 NOTE — Progress Notes (Signed)
[name] - 84 y.o. female MRN [id number]  Date of birth: 03-Dec-1932  SUBJECTIVE:  Including CC & ROS.  Chief Complaint  Patient presents with  . Right knee and hip pain    [name] is a 84 y.o. female that is presenting with right knee and hip pain. She completed a physical therapy in Feburary. Pain is acute on chronic in nature. Pain did not improve with the therapy. She uses a cane to walk daily.  Pain is located on the lateral aspect of her hip. She had minimal improvement in her pain. Has not been doing much home exercises. Pain is worse with walking. Pain is severe in nature.   Pain is on the lateral aspect of her knee. She had injections previously. Admits to weakness. Had limited improvement with previous steroid injection in January. Denies any injury or surgery. Pain is severe. Pain is worse with movement. She received gel injections in October.    Independent review of the MRI lumbar spine from 2018 shows disc degeneration and spinal stenosis and neural foraminal stenosis at different levels.  Independent review of the lumbar spine x-ray from May 2018 shows degenerative changes of the facets with mild curvature of the spine  Review of the CT scan of the abdomen and pelvis from 2015 shows degenerative changes of the thoracolumbar spine and sigmoid diverticulitis.    Review of Systems  Constitutional: Negative for fever.  HENT: Negative for congestion.   Respiratory: Positive for shortness of breath.   Cardiovascular: Negative for chest pain.  Gastrointestinal: Negative for abdominal pain.  Musculoskeletal: Positive for arthralgias.  Skin: Negative for color change.  Neurological: Positive for weakness.  Hematological: Negative for adenopathy.  Psychiatric/Behavioral: Negative for agitation.    HISTORY: Past Medical, Surgical, Social, and Family History Reviewed & Updated per EMR.   Pertinent Historical Findings include:  Past Medical History:    Diagnosis Date  . Allergic rhinitis   . Anxiety   . Arthritis    "back, hands; hips" (12/22/2014)  . Asthma   . Bladder atony   . Chronic bronchitis ([place])   . COPD (chronic obstructive pulmonary disease) ([place])   . Diarrhea   . Emphysema of lung ([place])   . GERD (gastroesophageal reflux disease)    "w/spicey foods" (12/22/2014)  . Hyperlipidemia   . Non-small cell carcinoma of lung, stage 1 ([place]) 2005   1.4 cm Poorly differentiated Squamous cell RUL  T1N0 resected 09/22/2003  . On home oxygen therapy    "3L q night and prn during the day" (12/22/2014)  . Pelvic cyst   . Pneumonia    "this is the 3rd time that I can remember" (12/22/2014)  . Unspecified hypothyroidism     Past Surgical History:  Procedure Laterality Date  . ABDOMINAL HYSTERECTOMY  ~ 1976  . BACK SURGERY    . CATARACT EXTRACTION W/ INTRAOCULAR LENS  IMPLANT, BILATERAL Bilateral ~ 2012  . DILATION AND CURETTAGE OF UTERUS    . LUMBAR DISC SURGERY  1970's  . LYMPH NODE DISSECTION  2005   "all my lymph nodes under breasts, went thru my back; Dr. Arlyce Dice did it"  . Needle aspiration Pelvic cyst  2011   Dr Diona Fanti  . VIDEO ASSISTED THORACOSCOPY (VATS)/THOROCOTOMY Right 09/2003   thoracotomy, right upper lobectomy with node dissection; Dr Demetrios Loll 11/02/2010  . VIDEO BRONCHOSCOPY  03/2004   Archie Endo 11/02/2010    Allergies  Allergen Reactions  . Ampicillin Diarrhea and Nausea Only  GI upset  . Azithromycin Diarrhea and Nausea And Vomiting  . Codeine Nausea And Vomiting and Other (See Comments)    Makes her "crazy"  . Daliresp [Roflumilast] Nausea Only  . Nitrofurantoin Diarrhea and Nausea Only  . Other Other (See Comments)    No nuts and seeds because of diverticulitis  . Penicillins Nausea And Vomiting, Swelling and Rash    Has patient had a PCN reaction causing immediate rash, facial/tongue/throat swelling, SOB or lightheadedness with hypotension: Yes Has patient had a PCN reaction causing severe rash involving  mucus membranes or skin necrosis: No Has patient had a PCN reaction that required hospitalization No Has patient had a PCN reaction occurring within the last 10 years: No No "cillins" If all of the above answers are "NO", then may proceed with Cephalosporin use.   . Sulfonamide Derivatives Diarrhea and Nausea And Vomiting  . Tiotropium Bromide Monohydrate     Urinary retention  . Doxycycline Nausea Only  . Fludrocortisone Acetate Itching  . Lactose Intolerance (Gi) Other (See Comments)    Gas and bloating   . Latex Itching and Rash  . Prednisone Other (See Comments)    Insomnia after 2 days    Family History  Problem Relation Age of Onset  . Heart disease Father   . Rheumatologic disease Brother   . Cancer Brother        Leukemia  . Rheumatologic disease Sister   . Breast cancer Daughter      Social History   Socioeconomic History  . Marital status: Married    Spouse name: Not on file  . Number of children: Not on file  . Years of education: Not on file  . Highest education level: Not on file  Occupational History  . Occupation: Retired Animator, Psychiatrist: RETIRED  Social Needs  . Financial resource strain: Not on file  . Food insecurity:    Worry: Not on file    Inability: Not on file  . Transportation needs:    Medical: Not on file    Non-medical: Not on file  Tobacco Use  . Smoking status: Former Smoker    Packs/day: 1.00    Years: 42.00    Pack years: 42.00    Types: Cigarettes    Last attempt to quit: 09/22/2003    Years since quitting: 14.0  . Smokeless tobacco: Never Used  Substance and Sexual Activity  . Alcohol use: Yes    Alcohol/week: 8.4 oz    Types: 14 Glasses of wine per week    Comment: 12/22/2014 "I have a large glass of wine qd"  . Drug use: No  . Sexual activity: Never  Lifestyle  . Physical activity:    Days per week: Not on file    Minutes per session: Not on file  . Stress: Not on file  Relationships  .  Social connections:    Talks on phone: Not on file    Gets together: Not on file    Attends religious service: Not on file    Active member of club or organization: Not on file    Attends meetings of clubs or organizations: Not on file    Relationship status: Not on file  . Intimate partner violence:    Fear of current or ex partner: Not on file    Emotionally abused: Not on file    Physically abused: Not on file    Forced sexual activity: Not on file  Other Topics Concern  . Not on file  Social History Narrative  . Not on file     PHYSICAL EXAM:  VS: BP 122/64 (BP Location: Left Arm, Patient Position: Sitting, Cuff Size: Normal)   Pulse 89   Temp 97.7 F (36.5 C) (Oral)   Ht 5\' 6"  (1.676 m)   SpO2 92%   BMI 23.53 kg/m  Physical Exam Gen: NAD, alert, cooperative with exam, [place] in place  ENT: normal lips, normal nasal mucosa,  Eye: normal EOM, normal conjunctiva and lids CV:  no edema, +2 pedal pulses   Resp: no accessory muscle use, non-labored,  Skin: no rashes, no areas of induration  Neuro: normal tone, normal sensation to touch Psych:  normal insight, alert and oriented MSK:  Right Hip:  TTP of the GT Normal IR and ER  Normal strength to resistance with hip flexion  Weakness with hip abduction to resistance.  Right knee:  TTP along the medial and lateral joint line  Normal flexion and extension  No instability  Negative McMurray's Neurovascularly intact     Aspiration/Injection Procedure Note KEVIA ZAUCHA 15-Aug-1932  Procedure: Injection Indications: right knee pain   Procedure Details Consent: Risks of procedure as well as the alternatives and risks of each were explained to the (patient/caregiver).  Consent for procedure obtained. Time Out: Verified patient identification, verified procedure, site/side was marked, verified correct patient position, special equipment/implants available, medications/allergies/relevent history reviewed, required  imaging and test results available.  Performed.  The area was cleaned with iodine and alcohol swabs.    The right knee superiolateral spp was injected using 1 cc's of 40 mg Depomedrol and 4 cc's of 1% lidocaine with a 22 1 1/2" needle.  Ultrasound was used. Images were obtained in Long views showing the injection.    A sterile dressing was applied.  Patient did tolerate procedure well.     Aspiration/Injection Procedure Note GICELA SCHWARTING 02-01-1933  Procedure: Injection Indications: right hip pain   Procedure Details Consent: Risks of procedure as well as the alternatives and risks of each were explained to the (patient/caregiver).  Consent for procedure obtained. Time Out: Verified patient identification, verified procedure, site/side was marked, verified correct patient position, special equipment/implants available, medications/allergies/relevent history reviewed, required imaging and test results available.  Performed.  The area was cleaned with iodine and alcohol swabs.    The right GT bursa was injected using 1 cc's of 40 mg Depomedrol and 4 cc's of 1% lidocaine with a 25 1 1/2" needle.   A sterile dressing was applied.  Patient did tolerate procedure well.      ASSESSMENT & PLAN:   Degenerative arthritis of right knee Having exacerbation of her right knee pain.  Had minimal improvement with previous injection in January. -Injection today.  Will consider gel injections going forward. - counseled on supportive care  - provided Pennsaid   Greater trochanteric bursitis of both hips Pain worsening on the GT. Unable to perform HEP  - GT injection today  - pennsaid  - could consider home health PT

## 2017-10-18 ENCOUNTER — Telehealth: Payer: Self-pay | Admitting: Family Medicine

## 2017-10-18 NOTE — Telephone Encounter (Signed)
Spoke with patient about Pecola Leisure, MD [facility] 10/18/2017, 1:22 PM

## 2017-11-01 ENCOUNTER — Ambulatory Visit: Payer: PPO | Admitting: Family Medicine

## 2017-11-02 ENCOUNTER — Other Ambulatory Visit (INDEPENDENT_AMBULATORY_CARE_PROVIDER_SITE_OTHER): Payer: PPO

## 2017-11-02 ENCOUNTER — Ambulatory Visit (INDEPENDENT_AMBULATORY_CARE_PROVIDER_SITE_OTHER): Payer: PPO | Admitting: Internal Medicine

## 2017-11-02 ENCOUNTER — Encounter: Payer: Self-pay | Admitting: Internal Medicine

## 2017-11-02 VITALS — BP 122/66 | HR 89 | Temp 98.1°F | Resp 18 | Wt 146.0 lb

## 2017-11-02 DIAGNOSIS — R5383 Other fatigue: Secondary | ICD-10-CM

## 2017-11-02 DIAGNOSIS — M7989 Other specified soft tissue disorders: Secondary | ICD-10-CM | POA: Diagnosis not present

## 2017-11-02 DIAGNOSIS — F419 Anxiety disorder, unspecified: Secondary | ICD-10-CM | POA: Diagnosis not present

## 2017-11-02 DIAGNOSIS — E038 Other specified hypothyroidism: Secondary | ICD-10-CM | POA: Diagnosis not present

## 2017-11-02 DIAGNOSIS — E785 Hyperlipidemia, unspecified: Secondary | ICD-10-CM | POA: Diagnosis not present

## 2017-11-02 LAB — CBC WITH DIFFERENTIAL/PLATELET
BASOS ABS: 0 10*3/uL (ref 0.0–0.1)
Basophils Relative: 0.5 % (ref 0.0–3.0)
EOS PCT: 0.7 % (ref 0.0–5.0)
Eosinophils Absolute: 0.1 10*3/uL (ref 0.0–0.7)
HCT: 37.9 % (ref 36.0–46.0)
HEMOGLOBIN: 12.7 g/dL (ref 12.0–15.0)
LYMPHS ABS: 1.6 10*3/uL (ref 0.7–4.0)
Lymphocytes Relative: 20.5 % (ref 12.0–46.0)
MCHC: 33.5 g/dL (ref 30.0–36.0)
MCV: 94.6 fl (ref 78.0–100.0)
MONO ABS: 0.8 10*3/uL (ref 0.1–1.0)
Monocytes Relative: 10.3 % (ref 3.0–12.0)
NEUTROS PCT: 68 % (ref 43.0–77.0)
Neutro Abs: 5.4 10*3/uL (ref 1.4–7.7)
Platelets: 231 10*3/uL (ref 150.0–400.0)
RBC: 4.01 Mil/uL (ref 3.87–5.11)
RDW: 13.2 % (ref 11.5–15.5)
WBC: 8 10*3/uL (ref 4.0–10.5)

## 2017-11-02 LAB — COMPREHENSIVE METABOLIC PANEL
ALBUMIN: 3.5 g/dL (ref 3.5–5.2)
ALK PHOS: 103 U/L (ref 39–117)
ALT: 14 U/L (ref 0–35)
AST: 23 U/L (ref 0–37)
BUN: 6 mg/dL (ref 6–23)
CALCIUM: 9.5 mg/dL (ref 8.4–10.5)
CHLORIDE: 96 meq/L (ref 96–112)
CO2: 33 mEq/L — ABNORMAL HIGH (ref 19–32)
Creatinine, Ser: 0.69 mg/dL (ref 0.40–1.20)
GFR: 86.45 mL/min (ref >60.00)
Glucose, Bld: 101 mg/dL — ABNORMAL HIGH (ref 70–99)
POTASSIUM: 4.2 meq/L (ref 3.5–5.1)
Sodium: 134 mEq/L — ABNORMAL LOW (ref 135–145)
TOTAL PROTEIN: 6.9 g/dL (ref 6.0–8.3)
Total Bilirubin: 0.6 mg/dL (ref 0.2–1.2)

## 2017-11-02 LAB — LIPID PANEL
CHOLESTEROL: 173 mg/dL (ref 0–200)
HDL: 77.1 mg/dL (ref >39.00)
LDL CALC: 76 mg/dL (ref 0–99)
NonHDL: 96.3
TRIGLYCERIDES: 104 mg/dL (ref 0.0–149.0)
Total CHOL/HDL Ratio: 2
VLDL: 20.8 mg/dL (ref 0.0–40.0)

## 2017-11-02 LAB — TSH: TSH: 4.05 u[IU]/mL (ref 0.35–4.50)

## 2017-11-02 NOTE — Progress Notes (Signed)
Subjective:    Patient ID: [name], female    DOB: [birth year], 84 y.o.   MRN: [id number]  HPI The patient is here for an acute visit for low energy.  She continues to have left shoulder pain and it hurts like a tooth ache and radiates down the arm.  It hurts intermittently.  She has a tingling in her upper arm intermittently. It started having after she fell onto her shoulder.  Sometimes the pain radiates up to neck. She was concerned it was her heart.    She still has right knee and hip pain.  She has seen sports medicine for this.  Injections have not helped.    She has fallen a few times over the past few months while using her cane.  She is using a Rolator and that has prevented further falls.  Her low energy and SOB prevents her from exercising.    OA:  She is taking tylenol during the day, gabapentin at night and occasional take ibuprofen or aleve.   Hyperlipidemia: She is taking her medication daily. She is compliant with a low fat/cholesterol diet. She is not exercising regularly due to her COPD.    Hypothyroidism:  She is taking her medication daily.  She states low energy.  She is fatigued, which is not new.   Anxiety: She is taking her medication daily as prescribed. She denies any side effects from the medication. She feels her anxiety is well controlled and she is happy with her current dose of medication.    Medications and allergies reviewed with patient and updated if appropriate.  Patient Active Problem List   Diagnosis Date Noted  . Right-sided chest pain 06/02/2017  . Osteopenia 02/16/2017  . Hyperglycemia 12/08/2016  . Degenerative arthritis of right knee 10/27/2016  . 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 Greater trochanteric bursitis of both hips 04/08/2016  . Pain in both knees 04/08/2016  . Chronic midline low back pain without sciatica 04/08/2016  . Dysuria 12/02/2015  . Hip pain 12/02/2015  . Chronic  respiratory failure with hypoxia ([place]) 03/07/2015  . Diverticulitis 05/24/2014  . Anxiety   . Bladder prolapse, female, acquired   . Arthritis   . Non-small cell carcinoma of lung, stage 1 ([place]) 01/10/2012  . Neuropathy, peripheral 01/28/2011  . COPD mixed type ([place]) 09/08/2010  . Dyslipidemia   . ATONY OF BLADDER 08/10/2009  . Hypothyroidism 03/12/2008  . Seasonal allergic rhinitis 10/10/2007    Current Outpatient Medications on File Prior to Visit  Medication Sig Dispense Refill  . acetaminophen (TYLENOL ARTHRITIS PAIN) 650 MG CR tablet Take 650 mg by mouth daily as needed for pain.     [name] albuterol (PROVENTIL HFA;VENTOLIN HFA) 108 (90 Base) MCG/ACT inhaler Inhale 2 puffs into the lungs every 6 (six) hours as needed for wheezing or shortness of breath. 1 Inhaler 12  . ALPRAZolam (XANAX) 1 MG tablet Take 1 tablet (1 mg total) by mouth at bedtime. -- Office visit needed for further refills 30 tablet 1  . aspirin EC 81 MG tablet Take 81 mg by mouth daily.     [name] atorvastatin (LIPITOR) 20 MG tablet TAKE 1 TABLET (20 MG TOTAL) BY MOUTH DAILY. (Patient taking differently: Take 20 mg by mouth every other day. Mon/Wed/Fri) 90 tablet 1  . Cholecalciferol (VITAMIN D) 2000 UNITS tablet Take 1 tablet (2,000 Units total) by mouth daily. 30 tablet 11  . FLOVENT HFA 110 MCG/ACT inhaler  INHALE 2 PUFFS INTO THE LUNGS 2 (TWO) TIMES DAILY. 12 g 5  . gabapentin (NEURONTIN) 100 MG capsule Take 2 capsules (200 mg total) by mouth at bedtime. 60 capsule 0  . ibuprofen (ADVIL,MOTRIN) 200 MG tablet Take 400 mg by mouth every 6 (six) hours as needed for mild pain.    [name] levalbuterol (XOPENEX) 1.25 MG/3ML nebulizer solution USE 1 NEBULE EVERY 8 HOURS AS NEEDED FOR WHEEZING 360 mL 2  . levothyroxine (SYNTHROID, LEVOTHROID) 88 MCG tablet TAKE 1 TABLET BY MOUTH EVERY DAY 90 tablet 1  . Multiple Vitamins-Minerals (CENTRUM SILVER PO) Take 1 tablet by mouth daily.     . Probiotic Product (PROBIOTIC PO) Take 1 capsule by  mouth daily.    [name] Respiratory Therapy Supplies (FLUTTER) DEVI Blow through 4 times per cycle and repeat 3 cycles per day 1 each 0   No current facility-administered medications on file prior to visit.     Past Medical History:  Diagnosis Date  . Allergic rhinitis   . Anxiety   . Arthritis    "back, hands; hips" (12/22/2014)  . Asthma   . Bladder atony   . Chronic bronchitis ([place])   . COPD (chronic obstructive pulmonary disease) ([place])   . Diarrhea   . Emphysema of lung ([place])   . GERD (gastroesophageal reflux disease)    "w/spicey foods" (12/22/2014)  . Hyperlipidemia   . Non-small cell carcinoma of lung, stage 1 ([place]) 2005   1.4 cm Poorly differentiated Squamous cell RUL  T1N0 resected 09/22/2003  . On home oxygen therapy    "3L q night and prn during the day" (12/22/2014)  . Pelvic cyst   . Pneumonia    "this is the 3rd time that I can remember" (12/22/2014)  . Unspecified hypothyroidism     Past Surgical History:  Procedure Laterality Date  . ABDOMINAL HYSTERECTOMY  ~ 1976  . BACK SURGERY    . CATARACT EXTRACTION W/ INTRAOCULAR LENS  IMPLANT, BILATERAL Bilateral ~ 2012  . DILATION AND CURETTAGE OF UTERUS    . LUMBAR DISC SURGERY  1970's  . LYMPH NODE DISSECTION  2005   "all my lymph nodes under breasts, went thru my back; Dr. Arlyce Dice did it"  . Needle aspiration Pelvic cyst  2011   Dr Diona Fanti  . VIDEO ASSISTED THORACOSCOPY (VATS)/THOROCOTOMY Right 09/2003   thoracotomy, right upper lobectomy with node dissection; Dr Demetrios Loll 11/02/2010  . VIDEO BRONCHOSCOPY  03/2004   Archie Endo 11/02/2010    Social History   Socioeconomic History  . Marital status: Married    Spouse name: Not on file  . Number of children: Not on file  . Years of education: Not on file  . Highest education level: Not on file  Occupational History  . Occupation: Retired Animator, Psychiatrist: RETIRED  Social Needs  . Financial resource strain: Not on file  . Food insecurity:     Worry: Not on file    Inability: Not on file  . Transportation needs:    Medical: Not on file    Non-medical: Not on file  Tobacco Use  . Smoking status: Former Smoker    Packs/day: 1.00    Years: 42.00    Pack years: 42.00    Types: Cigarettes    Last attempt to quit: 09/22/2003    Years since quitting: 14.1  . Smokeless tobacco: Never Used  Substance and Sexual Activity  . Alcohol use: Yes    Alcohol/week: 8.4  oz    Types: 14 Glasses of wine per week    Comment: 12/22/2014 "I have a large glass of wine qd"  . Drug use: No  . Sexual activity: Never  Lifestyle  . Physical activity:    Days per week: Not on file    Minutes per session: Not on file  . Stress: Not on file  Relationships  . Social connections:    Talks on phone: Not on file    Gets together: Not on file    Attends religious service: Not on file    Active member of club or organization: Not on file    Attends meetings of clubs or organizations: Not on file    Relationship status: Not on file  Other Topics Concern  . Not on file  Social History Narrative  . Not on file    Family History  Problem Relation Age of Onset  . Heart disease Father   . Rheumatologic disease Brother   . Cancer Brother        Leukemia  . Rheumatologic disease Sister   . Breast cancer Daughter     Review of Systems  Constitutional: Negative for chills and fever.  Respiratory: Positive for cough, shortness of breath and wheezing.   Cardiovascular: Positive for leg swelling (feet swelling at end of day). Negative for chest pain and palpitations.  Neurological: Positive for light-headedness (occ). Negative for headaches.       Objective:   Vitals:   11/02/17 1042  BP: 122/66  Pulse: 89  Resp: 18  Temp: 98.1 F (36.7 C)  SpO2: 93%   BP Readings from Last 3 Encounters:  11/02/17 122/66  10/17/17 122/64  08/21/17 118/68   Wt Readings from Last 3 Encounters:  11/02/17 146 lb (66.2 kg)  08/21/17 145 lb 12.8 oz (66.1 kg)   06/29/17 147 lb (66.7 kg)   Body mass index is 23.57 kg/m.   Physical Exam    Constitutional: Appears well-developed and well-nourished. No distress.  HENT:  Head: Normocephalic and atraumatic.  Neck: Neck supple. No tracheal deviation present. No thyromegaly present.  No cervical lymphadenopathy Cardiovascular: Normal rate, regular rhythm and normal heart sounds.   No murmur heard. No carotid bruit .  No edema Pulmonary/Chest: Effort normal and breath sounds normal. No respiratory distress. No has no wheezes. No rales.  Skin: Skin is warm and dry. Not diaphoretic.  Psychiatric: Normal mood and affect. Behavior is normal.      Assessment & Plan:    See Problem List for Assessment and Plan of chronic medical problems.

## 2017-11-02 NOTE — Assessment & Plan Note (Signed)
Check lipid panel  Continue daily statin Regular exercise and healthy diet encouraged  

## 2017-11-02 NOTE — Assessment & Plan Note (Addendum)
Likely multifactorial - discussed her copd, chronic resp failure, sedentary lifestyle Check labs - cbc, tsh, cmp

## 2017-11-02 NOTE — Assessment & Plan Note (Signed)
Takes xanax at night - been on it for years Will continue

## 2017-11-02 NOTE — Patient Instructions (Addendum)
  Test(s) ordered today. Your results will be released to [place] (or called to you) after review, usually within 72hours after test completion. If any changes need to be made, you will be notified at that same time.  Medications reviewed and updated.  No changes recommended at this time.   Follow up with Dr Tamala Julian.   Please followup in 6 months

## 2017-11-02 NOTE — Assessment & Plan Note (Signed)
No energy at times Check tsh

## 2017-11-02 NOTE — Assessment & Plan Note (Signed)
Mild swelling in feet No swelling on exam today

## 2017-11-03 ENCOUNTER — Other Ambulatory Visit: Payer: Self-pay | Admitting: Emergency Medicine

## 2017-11-03 MED ORDER — ONDANSETRON HCL 4 MG PO TABS: 4.0000 mg | ORAL_TABLET | Freq: Three times a day (TID) | ORAL | 0 refills | Status: DC | PRN

## 2017-11-07 ENCOUNTER — Other Ambulatory Visit: Payer: Self-pay | Admitting: Family Medicine

## 2017-11-08 DIAGNOSIS — J479 Bronchiectasis, uncomplicated: Secondary | ICD-10-CM | POA: Diagnosis not present

## 2017-11-14 DIAGNOSIS — J449 Chronic obstructive pulmonary disease, unspecified: Secondary | ICD-10-CM | POA: Diagnosis not present

## 2017-11-14 DIAGNOSIS — J9611 Chronic respiratory failure with hypoxia: Secondary | ICD-10-CM | POA: Diagnosis not present

## 2017-11-15 ENCOUNTER — Telehealth: Payer: Self-pay

## 2017-11-15 NOTE — Telephone Encounter (Signed)
Spoke with patient who wanted to make sure that [employer] was billed for her brace as she just got a bill from [employer]. Looked in her [facility] MD chart to and see that insurance card for both [employer] and [employer] were submitted for payment. Told patient that brace must be returned within 2 weeks of receiving it and she got brace in October 2018. Patient states that she got injection recently as well and that did not help her pain. Offered her an appointment with Dr. Tamala Julian and she declined at this time. Recommended that patient try to call [employer] to speak with them about whether or not they denied coverage for brace. Patient voices understanding.

## 2017-11-15 NOTE — Telephone Encounter (Signed)
Copied from [place] [id number]. Topic: Quick Communication - See Telephone Encounter >> Nov 15, 2017  3:26 PM [name] wrote: CRM for notification. See Telephone encounter for: 11/15/17.  Pt says that Dr. Tamala Julian gave her a knee brace and she is unable to use it . Pt says that it doesn't fit correctly so she has not been using it. Pt would like to know how do she go about returning the brace. Pt also says that she received a bill for brace for 149.70. Pt says that she do not want to pay for something that she can not use. Please assist pt with return. Pt says that brace was also billed to incorrect insurance, she states that if she is unable to return she will contact billing to see if brace could be billed under correct insurance.    CB: [phone]

## 2017-11-20 ENCOUNTER — Telehealth: Payer: Self-pay | Admitting: Emergency Medicine

## 2017-11-20 NOTE — Telephone Encounter (Signed)
Copied from [place] [phone]. Topic: Referral - Request >> Nov 20, 2017  3:01 PM Vernona Rieger wrote: Reason for CRM: Patient said she spoke with Dr Quay Burow at her appointment on 5/19 about having an echo. She said she wants to go ahead and have that down. Call back is [phone]

## 2017-11-21 ENCOUNTER — Other Ambulatory Visit: Payer: Self-pay | Admitting: Internal Medicine

## 2017-11-21 DIAGNOSIS — M7989 Other specified soft tissue disorders: Secondary | ICD-10-CM

## 2017-11-21 DIAGNOSIS — R0609 Other forms of dyspnea: Secondary | ICD-10-CM

## 2017-11-21 NOTE — Telephone Encounter (Signed)
Spoke with pt to inform order was placed.

## 2017-11-21 NOTE — Telephone Encounter (Signed)
Echo ordered.

## 2017-11-28 ENCOUNTER — Ambulatory Visit (HOSPITAL_COMMUNITY): Payer: PPO | Attending: Cardiovascular Disease

## 2017-11-28 ENCOUNTER — Other Ambulatory Visit: Payer: Self-pay

## 2017-11-28 DIAGNOSIS — Z902 Acquired absence of lung [part of]: Secondary | ICD-10-CM | POA: Insufficient documentation

## 2017-11-28 DIAGNOSIS — E785 Hyperlipidemia, unspecified: Secondary | ICD-10-CM | POA: Insufficient documentation

## 2017-11-28 DIAGNOSIS — M7989 Other specified soft tissue disorders: Secondary | ICD-10-CM | POA: Diagnosis not present

## 2017-11-28 DIAGNOSIS — R0609 Other forms of dyspnea: Secondary | ICD-10-CM

## 2017-11-28 DIAGNOSIS — Z85118 Personal history of other malignant neoplasm of bronchus and lung: Secondary | ICD-10-CM | POA: Insufficient documentation

## 2017-11-28 DIAGNOSIS — Z87891 Personal history of nicotine dependence: Secondary | ICD-10-CM | POA: Diagnosis not present

## 2017-11-28 DIAGNOSIS — J449 Chronic obstructive pulmonary disease, unspecified: Secondary | ICD-10-CM | POA: Insufficient documentation

## 2017-12-04 ENCOUNTER — Telehealth: Payer: Self-pay | Admitting: Internal Medicine

## 2017-12-04 NOTE — Telephone Encounter (Signed)
Discussed with pt

## 2017-12-04 NOTE — Telephone Encounter (Signed)
Copied from [place] [id number]. Topic: Quick Communication - Rx Refill/Question >> Dec 04, 2017  1:13 PM Oliver Pila B wrote: Medication: gabapentin (NEURONTIN) 100 MG capsule [[id number]]   Pt called for Dr. Tamala Julian and asked if she can take the medication in the morning on top of taking the medication at night, b/c the pinched nerve is really painful, call pt to advise

## 2017-12-04 NOTE — Telephone Encounter (Signed)
Yes but may make her sleepy so try it at home first
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
## 2017-12-06 ENCOUNTER — Encounter: Payer: Self-pay | Admitting: Family Medicine

## 2017-12-06 ENCOUNTER — Ambulatory Visit (INDEPENDENT_AMBULATORY_CARE_PROVIDER_SITE_OTHER)
Admission: RE | Admit: 2017-12-06 | Discharge: 2017-12-06 | Disposition: A | Discharge status: Home / Self Care | Payer: PPO | Source: Ambulatory Visit | Attending: Family Medicine | Admitting: Family Medicine

## 2017-12-06 ENCOUNTER — Ambulatory Visit (INDEPENDENT_AMBULATORY_CARE_PROVIDER_SITE_OTHER): Payer: PPO | Admitting: Family Medicine

## 2017-12-06 VITALS — BP 122/62 | HR 84 | Ht 66.0 in | Wt 146.0 lb

## 2017-12-06 DIAGNOSIS — M5030 Other cervical disc degeneration, unspecified cervical region: Secondary | ICD-10-CM | POA: Diagnosis not present

## 2017-12-06 DIAGNOSIS — M542 Cervicalgia: Secondary | ICD-10-CM

## 2017-12-06 MED ORDER — GABAPENTIN 100 MG PO CAPS: 200.0000 mg | ORAL_CAPSULE | Freq: Three times a day (TID) | ORAL | 3 refills | Status: DC

## 2017-12-06 NOTE — Patient Instructions (Addendum)
Good to see you  I think it is a nerve in your neck  We tried some trigger point injections to help  Increase gabapentin to 200mg  3 times a day but make sure it does not make you sleepy  We will watch the knee and the hip  See me again in 4 weeks

## 2017-12-06 NOTE — Assessment & Plan Note (Signed)
Significant arthritic changes.  Discussed icing regimen.  Patient did not increase gabapentin but warned of potential side effects. X-rays pending with history of potential cancer.  Patients will need to be monitored.  Could be a candidate for advanced imaging if patient will consider epidurals.  Patient is in agreement with the plan and will follow-up again in 2 to 4 weeks

## 2017-12-08 ENCOUNTER — Other Ambulatory Visit: Payer: Self-pay | Admitting: Internal Medicine

## 2017-12-09 DIAGNOSIS — J479 Bronchiectasis, uncomplicated: Secondary | ICD-10-CM | POA: Diagnosis not present

## 2017-12-15 ENCOUNTER — Other Ambulatory Visit: Payer: Self-pay | Admitting: Internal Medicine

## 2017-12-15 DIAGNOSIS — J9611 Chronic respiratory failure with hypoxia: Secondary | ICD-10-CM | POA: Diagnosis not present

## 2017-12-15 DIAGNOSIS — J449 Chronic obstructive pulmonary disease, unspecified: Secondary | ICD-10-CM | POA: Diagnosis not present

## 2017-12-15 NOTE — Telephone Encounter (Signed)
[place] Controlled Substance Database checked. Last filled on 11/16/17

## 2017-12-26 ENCOUNTER — Ambulatory Visit: Payer: PPO | Admitting: Internal Medicine

## 2017-12-29 ENCOUNTER — Other Ambulatory Visit: Payer: Self-pay | Admitting: Family Medicine

## 2018-01-01 ENCOUNTER — Other Ambulatory Visit: Payer: Self-pay

## 2018-01-01 MED ORDER — GABAPENTIN 100 MG PO CAPS: 200.0000 mg | ORAL_CAPSULE | Freq: Three times a day (TID) | ORAL | 1 refills | Status: DC

## 2018-01-03 ENCOUNTER — Encounter: Payer: Self-pay | Admitting: Family Medicine

## 2018-01-03 ENCOUNTER — Ambulatory Visit (INDEPENDENT_AMBULATORY_CARE_PROVIDER_SITE_OTHER): Payer: PPO | Admitting: Family Medicine

## 2018-01-03 DIAGNOSIS — M7061 Trochanteric bursitis, right hip: Secondary | ICD-10-CM | POA: Diagnosis not present

## 2018-01-03 DIAGNOSIS — M1711 Unilateral primary osteoarthritis, right knee: Secondary | ICD-10-CM | POA: Diagnosis not present

## 2018-01-03 DIAGNOSIS — M7062 Trochanteric bursitis, left hip: Secondary | ICD-10-CM | POA: Diagnosis not present

## 2018-01-03 NOTE — Assessment & Plan Note (Signed)
Patient was given another injection today.  Tolerated the procedure well.  Does have known nerve impingement in the back.  Could consider possible epidurals.  Patient is to have a surgical risk.  Discussed icing regimen.  Follow-up again in 4 weeks if no improvement otherwise 10 weeks when patient is following up for her knee injection

## 2018-01-03 NOTE — Patient Instructions (Signed)
I am sorry I do not have the magic wand.  Stay active when you can  Injected the knee and the hip today and hope gives you some relief.  See me again in 10 weeks for another injection

## 2018-01-03 NOTE — Assessment & Plan Note (Signed)
Patient given a steroid injection.  Continue Visco supplementation again if necessary.  Patient will consider.  Continue to be active.  Follow-up again in 10 weeks

## 2018-01-03 NOTE — Progress Notes (Signed)
[name] [name], [place] [address] Phone: [phone] Subjective:    I'm seeing this patient by the request  of:    CC: Hip and knee pain  BTD:[id number]  [name] is a 84 y.o. female coming in with complaint of right-sided hip and knee pain.  Patient has had significant trouble with bursitis of the hip as well as knee arthritis.  Has had injections previously.  Last injection greater than 3 months ago in the knee and greater than 6 months ago on the side of the hip.  Patient also has chronic pain everywhere else that seems to be worsening as well.  Having difficulty even walking long distances.  Has been using a walker intermittently #1 out of the house has been in a wheelchair.    Past Medical History:  Diagnosis Date  . Allergic rhinitis   . Anxiety   . Arthritis    "back, hands; hips" (12/22/2014)  . Asthma   . Bladder atony   . Chronic bronchitis ([place])   . COPD (chronic obstructive pulmonary disease) ([place])   . Diarrhea   . Emphysema of lung ([place])   . GERD (gastroesophageal reflux disease)    "w/spicey foods" (12/22/2014)  . Hyperlipidemia   . Non-small cell carcinoma of lung, stage 1 ([place]) 2005   1.4 cm Poorly differentiated Squamous cell RUL  T1N0 resected 09/22/2003  . On home oxygen therapy    "3L q night and prn during the day" (12/22/2014)  . Pelvic cyst   . Pneumonia    "this is the 3rd time that I can remember" (12/22/2014)  . Unspecified hypothyroidism    Past Surgical History:  Procedure Laterality Date  . ABDOMINAL HYSTERECTOMY  ~ 1976  . BACK SURGERY    . CATARACT EXTRACTION W/ INTRAOCULAR LENS  IMPLANT, BILATERAL Bilateral ~ 2012  . DILATION AND CURETTAGE OF UTERUS    . LUMBAR DISC SURGERY  1970's  . LYMPH NODE DISSECTION  2005   "all my lymph nodes under breasts, went thru my back; Dr. Arlyce Dice did it"  . Needle aspiration Pelvic cyst  2011   Dr Diona Fanti  . VIDEO ASSISTED THORACOSCOPY (VATS)/THOROCOTOMY  Right 09/2003   thoracotomy, right upper lobectomy with node dissection; Dr Demetrios Loll 11/02/2010  . VIDEO BRONCHOSCOPY  03/2004   Archie Endo 11/02/2010   Social History   Socioeconomic History  . Marital status: Married    Spouse name: Not on file  . Number of children: Not on file  . Years of education: Not on file  . Highest education level: Not on file  Occupational History  . Occupation: Retired Animator, Psychiatrist: RETIRED  Social Needs  . Financial resource strain: Not on file  . Food insecurity:    Worry: Not on file    Inability: Not on file  . Transportation needs:    Medical: Not on file    Non-medical: Not on file  Tobacco Use  . Smoking status: Former Smoker    Packs/day: 1.00    Years: 42.00    Pack years: 42.00    Types: Cigarettes    Last attempt to quit: 09/22/2003    Years since quitting: 14.2  . Smokeless tobacco: Never Used  Substance and Sexual Activity  . Alcohol use: Yes    Alcohol/week: 8.4 oz    Types: 14 Glasses of wine per week    Comment: 12/22/2014 "I have a large glass  of wine qd"  . Drug use: No  . Sexual activity: Never  Lifestyle  . Physical activity:    Days per week: Not on file    Minutes per session: Not on file  . Stress: Not on file  Relationships  . Social connections:    Talks on phone: Not on file    Gets together: Not on file    Attends religious service: Not on file    Active member of club or organization: Not on file    Attends meetings of clubs or organizations: Not on file    Relationship status: Not on file  Other Topics Concern  . Not on file  Social History Narrative  . Not on file   Allergies  Allergen Reactions  . Ampicillin Diarrhea and Nausea Only    GI upset  . Azithromycin Diarrhea and Nausea And Vomiting  . Codeine Nausea And Vomiting and Other (See Comments)    Makes her "crazy"  . Daliresp [Roflumilast] Nausea Only  . Nitrofurantoin Diarrhea and Nausea Only  . Other Other (See  Comments)    No nuts and seeds because of diverticulitis  . Penicillins Nausea And Vomiting, Swelling and Rash    Has patient had a PCN reaction causing immediate rash, facial/tongue/throat swelling, SOB or lightheadedness with hypotension: Yes Has patient had a PCN reaction causing severe rash involving mucus membranes or skin necrosis: No Has patient had a PCN reaction that required hospitalization No Has patient had a PCN reaction occurring within the last 10 years: No No "cillins" If all of the above answers are "NO", then may proceed with Cephalosporin use.   . Sulfonamide Derivatives Diarrhea and Nausea And Vomiting  . Tiotropium Bromide Monohydrate     Urinary retention  . Doxycycline Nausea Only  . Fludrocortisone Acetate Itching  . Lactose Intolerance (Gi) Other (See Comments)    Gas and bloating   . Latex Itching and Rash  . Prednisone Other (See Comments)    Insomnia after 2 days   Family History  Problem Relation Age of Onset  . Heart disease Father   . Rheumatologic disease Brother   . Cancer Brother        Leukemia  . Rheumatologic disease Sister   . Breast cancer Daughter      Past medical history, social, surgical and family history all reviewed in electronic medical record.  No pertanent information unless stated regarding to the chief complaint.   Review of Systems:Review of systems updated and as accurate as of 01/03/18  No headache, visual changes, nausea, vomiting, diarrhea, constipation, dizziness, abdominal pain, skin rash, fevers, chills, night sweats, weight loss, swollen lymph nodes,  chest pain, shortness of breath, mood changes.  Positive muscle aches, body aches, joint swelling  Objective  Blood pressure 120/60, pulse 89, height 5\' 6"  (1.676 m), SpO2 96 %. Systems examined below as of 01/03/18   General: No apparent distress alert and orien mildly cachectic ed x3 mood and affect normal, dressed appropriately.  HEENT: Pupils equal, extraocular  movements intact  Respiratory: Patient's speak in full sentences and does not appear short of breath  Cardiovascular: Trace lower extremity edema, non tender, no erythema  Skin: Warm dry intact with no signs of infection or rash on extremities or on axial skeleton.  Abdomen: Soft nontender  Neuro: Cranial nerves II through XII are intact, neurovascularly intact in all extremities with 2+ DTRs and 2+ pulses.  Lymph: No lymphadenopathy of posterior or anterior cervical chain or  axillae bilaterally.  Gait patient is in a wheelchair MSK:  tender with limited range of motion mild instability and symmetric strength and tone of shoulders, elbows, wrist, , and ankles bilaterally.  Right hip exam shows some limited range of motion in all planes, mild positive straight leg test.  Unable to do Corky Sox secondary to pain.  Severe pain over the greater trochanteric area.  Minimal pain over the groin  Knee: Right valgus deformity noted.  Tender to palpation over medial and PF joint line.  ROM full in flexion and extension and lower leg rotation. instability with valgus force.  painful patellar compression. Patellar glide with moderate crepitus. Patellar and quadriceps tendons unremarkable. Hamstring and quadriceps strength is normal. Contralateral knee shows contralateral shows mild to moderate arthritis  After verbal consent patient was prepped in sterile fashion with alcohol swabs. Ethyl chloride used patient was injected with a 22-gauge 3 inch needle into the RIGHT lateral hip in the greater trochanteric area under ultrasound guidance. Picture was taken. Patient had 4 cc of 0.5% Marcaine and 1 cc of Kenalog 40 mg/dL injected. Patient tolerated the procedure well and no blood loss. Pain completely resolved after injection stating proper placement. Post injection instructions given.  After informed written and verbal consent, patient was seated on exam table. Right knee was prepped with alcohol swab and  utilizing anterolateral approach, patient's right knee space was injected with 4:1  marcaine 0.5%: Kenalog 40mg /dL. Patient tolerated the procedure well without immediate complications.   Impression and Recommendations:     This case required medical decision making of moderate complexity.      Note: This dictation was prepared with Dragon dictation along with smaller phrase technology. Any transcriptional errors that result from this process are unintentional.

## 2018-01-08 DIAGNOSIS — J479 Bronchiectasis, uncomplicated: Secondary | ICD-10-CM | POA: Diagnosis not present

## 2018-01-14 DIAGNOSIS — J9611 Chronic respiratory failure with hypoxia: Secondary | ICD-10-CM | POA: Diagnosis not present

## 2018-01-14 DIAGNOSIS — J449 Chronic obstructive pulmonary disease, unspecified: Secondary | ICD-10-CM | POA: Diagnosis not present

## 2018-01-15 ENCOUNTER — Other Ambulatory Visit: Payer: Self-pay | Admitting: Internal Medicine

## 2018-01-23 ENCOUNTER — Telehealth: Payer: Self-pay | Admitting: Emergency Medicine

## 2018-01-23 DIAGNOSIS — M5430 Sciatica, unspecified side: Secondary | ICD-10-CM

## 2018-01-23 NOTE — Telephone Encounter (Signed)
Copied from [place] [id number]. Topic: General - Other >> Jan 23, 2018 11:43 AM Lennox Solders wrote: Reason for CRM:pt is having sciatica nerve pain and would like a referral to neurologist. Pt unable to come in office due to she is afraid she may fall. Pt is having lower back pain that is radiating down to her  leg and foot. Pt is taking gabapentin that is not helping. Please advise

## 2018-01-23 NOTE — Telephone Encounter (Signed)
I ordered the referral for neuro.

## 2018-01-30 ENCOUNTER — Encounter: Payer: Self-pay | Admitting: Neurology

## 2018-02-08 DIAGNOSIS — J479 Bronchiectasis, uncomplicated: Secondary | ICD-10-CM | POA: Diagnosis not present

## 2018-02-13 ENCOUNTER — Ambulatory Visit: Payer: PPO | Admitting: Internal Medicine

## 2018-02-14 ENCOUNTER — Other Ambulatory Visit: Payer: Self-pay | Admitting: Internal Medicine

## 2018-02-14 DIAGNOSIS — J9611 Chronic respiratory failure with hypoxia: Secondary | ICD-10-CM | POA: Diagnosis not present

## 2018-02-14 DIAGNOSIS — J449 Chronic obstructive pulmonary disease, unspecified: Secondary | ICD-10-CM | POA: Diagnosis not present

## 2018-02-14 NOTE — Telephone Encounter (Signed)
[place] Controlled Substance Database checked. Last filled on 01/15/18

## 2018-02-15 ENCOUNTER — Ambulatory Visit: Payer: PPO | Admitting: Internal Medicine

## 2018-02-28 ENCOUNTER — Encounter: Payer: Self-pay | Admitting: Internal Medicine

## 2018-02-28 ENCOUNTER — Ambulatory Visit (INDEPENDENT_AMBULATORY_CARE_PROVIDER_SITE_OTHER): Payer: PPO | Admitting: Internal Medicine

## 2018-02-28 VITALS — BP 130/72 | HR 100 | Temp 98.8°F | Resp 16 | Ht 66.0 in

## 2018-02-28 DIAGNOSIS — W19XXXA Unspecified fall, initial encounter: Secondary | ICD-10-CM | POA: Diagnosis not present

## 2018-02-28 DIAGNOSIS — S51812A Laceration without foreign body of left forearm, initial encounter: Secondary | ICD-10-CM

## 2018-02-28 DIAGNOSIS — S40812A Abrasion of left upper arm, initial encounter: Secondary | ICD-10-CM | POA: Insufficient documentation

## 2018-02-28 DIAGNOSIS — S41112A Laceration without foreign body of left upper arm, initial encounter: Secondary | ICD-10-CM

## 2018-02-28 NOTE — Progress Notes (Signed)
Subjective:    Patient ID: [name], female    DOB: 04-Jan-1933, 84 y.o.   MRN: [id number]  HPI The patient is here for an acute visit.  Last night she got up to go to the bathroom and fell in the bathroom.  She uses her walker in the house, but going to the bathroom she needs to use her cane because her walker does not fit into the bathroom.  She is unsure exactly how she fell-she does recall falling and not being able to catch herself.  She did not have any chest pain, palpitations, lightheadedness or dizziness.  She is unsure if she slipped on something.  She fell on her left arm, left hip.  She did hit the left side of her eye.  There is no loss of consciousness.  She did bruise her knees trying to get up.  Her husband tried to lift her and she thinks that is how she got the bruises on the right forearm and elbow because she did not hit that area.  She is not experiencing any headaches, lightheadedness, dizziness, change in vision or nausea.  She denies confusion.  Her husband did call 911 and EMS came out and wrapped her arm.  They did discuss going to the emergency room, but she preferred to wait and come see me.  She was able to sleep last night.  She did not take any Tylenol today.  She is having significant pain in her left arm.  There was some bleeding in the left arm through the bandage.  She feels slightly nauseous because of the pain.  She is concerned because there are several areas where the skin came off of her left arm.     Medications and allergies reviewed with patient and updated if appropriate.  Patient Active Problem List   Diagnosis Date Noted  . Degenerative disc disease, cervical 12/06/2017  . DOE (dyspnea on exertion) 11/21/2017  . Fatigue 11/02/2017  . Bilateral swelling of feet 11/02/2017  . Right-sided chest pain 06/02/2017  . Osteopenia 02/16/2017  . Hyperglycemia 12/08/2016  . Degenerative arthritis of right knee 10/27/2016  . 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 Greater trochanteric bursitis of both hips 04/08/2016  . Pain in both knees 04/08/2016  . Chronic midline low back pain without sciatica 04/08/2016  . Dysuria 12/02/2015  . Hip pain 12/02/2015  . Chronic respiratory failure with hypoxia ([place]) 03/07/2015  . Diverticulitis 05/24/2014  . Anxiety   . Bladder prolapse, female, acquired   . Arthritis   . Non-small cell carcinoma of lung, stage 1 ([place]) 01/10/2012  . Neuropathy, peripheral 01/28/2011  . COPD mixed type ([place]) 09/08/2010  . Dyslipidemia   . ATONY OF BLADDER 08/10/2009  . Hypothyroidism 03/12/2008  . Seasonal allergic rhinitis 10/10/2007    Current Outpatient Medications on File Prior to Visit  Medication Sig Dispense Refill  . acetaminophen (TYLENOL ARTHRITIS PAIN) 650 MG CR tablet Take 650 mg by mouth daily as needed for pain.     [name] ALPRAZolam (XANAX) 1 MG tablet TAKE 1 TABLET (1 MG TOTAL) BY MOUTH AT BEDTIME. -- OFFICE VISIT NEEDED FOR FURTHER REFILLS 30 tablet 1  . atorvastatin (LIPITOR) 20 MG tablet TAKE 1 TABLET (20 MG TOTAL) BY MOUTH DAILY. (Patient taking differently: Take 20 mg by mouth every other day. ) 90 tablet 1  . Cholecalciferol (VITAMIN D) 2000 UNITS tablet Take 1 tablet (2,000 Units total) by mouth daily.  30 tablet 11  . FLOVENT HFA 110 MCG/ACT inhaler TAKE 2 PUFFS BY MOUTH TWICE A DAY 12 Inhaler 5  . gabapentin (NEURONTIN) 100 MG capsule Take 2 capsules (200 mg total) by mouth 3 (three) times daily. (Patient taking differently: Take 200 mg by mouth once. ) 540 capsule 1  . levalbuterol (XOPENEX) 1.25 MG/3ML nebulizer solution USE 1 NEBULE EVERY 8 HOURS AS NEEDED FOR WHEEZING (Patient taking differently: USE 1 NEBULE ONCE A DAY AS NEED AS NEEDED FOR WHEEZING) 360 mL 2  . levothyroxine (SYNTHROID, LEVOTHROID) 88 MCG tablet TAKE 1 TABLET BY MOUTH EVERY DAY 90 tablet 1  . Multiple Vitamins-Minerals (CENTRUM SILVER PO) Take 1 tablet by mouth daily.     .  ondansetron (ZOFRAN) 4 MG tablet Take 1 tablet (4 mg total) by mouth every 8 (eight) hours as needed for nausea or vomiting. 20 tablet 0  . Probiotic Product (PROBIOTIC PO) Take 1 capsule by mouth daily.    [name] Respiratory Therapy Supplies (FLUTTER) DEVI Blow through 4 times per cycle and repeat 3 cycles per day 1 each 0   No current facility-administered medications on file prior to visit.     Past Medical History:  Diagnosis Date  . Allergic rhinitis   . Anxiety   . Arthritis    "back, hands; hips" (12/22/2014)  . Asthma   . Bladder atony   . Chronic bronchitis ([place])   . COPD (chronic obstructive pulmonary disease) ([place])   . Diarrhea   . Emphysema of lung ([place])   . GERD (gastroesophageal reflux disease)    "w/spicey foods" (12/22/2014)  . Hyperlipidemia   . Non-small cell carcinoma of lung, stage 1 ([place]) 2005   1.4 cm Poorly differentiated Squamous cell RUL  T1N0 resected 09/22/2003  . On home oxygen therapy    "3L q night and prn during the day" (12/22/2014)  . Pelvic cyst   . Pneumonia    "this is the 3rd time that I can remember" (12/22/2014)  . Unspecified hypothyroidism     Past Surgical History:  Procedure Laterality Date  . ABDOMINAL HYSTERECTOMY  ~ 1976  . BACK SURGERY    . CATARACT EXTRACTION W/ INTRAOCULAR LENS  IMPLANT, BILATERAL Bilateral ~ 2012  . DILATION AND CURETTAGE OF UTERUS    . LUMBAR DISC SURGERY  1970's  . LYMPH NODE DISSECTION  2005   "all my lymph nodes under breasts, went thru my back; Dr. Arlyce Dice did it"  . Needle aspiration Pelvic cyst  2011   Dr Diona Fanti  . VIDEO ASSISTED THORACOSCOPY (VATS)/THOROCOTOMY Right 09/2003   thoracotomy, right upper lobectomy with node dissection; Dr Demetrios Loll 11/02/2010  . VIDEO BRONCHOSCOPY  03/2004   Archie Endo 11/02/2010    Social History   Socioeconomic History  . Marital status: Married    Spouse name: Not on file  . Number of children: Not on file  . Years of education: Not on file  . Highest education level:  Not on file  Occupational History  . Occupation: Retired Animator, Psychiatrist: RETIRED  Social Needs  . Financial resource strain: Not on file  . Food insecurity:    Worry: Not on file    Inability: Not on file  . Transportation needs:    Medical: Not on file    Non-medical: Not on file  Tobacco Use  . Smoking status: Former Smoker    Packs/day: 1.00    Years: 42.00    Pack years: 42.00  Types: Cigarettes    Last attempt to quit: 09/22/2003    Years since quitting: 14.4  . Smokeless tobacco: Never Used  Substance and Sexual Activity  . Alcohol use: Yes    Alcohol/week: 14.0 standard drinks    Types: 14 Glasses of wine per week    Comment: 12/22/2014 "I have a large glass of wine qd"  . Drug use: No  . Sexual activity: Never  Lifestyle  . Physical activity:    Days per week: Not on file    Minutes per session: Not on file  . Stress: Not on file  Relationships  . Social connections:    Talks on phone: Not on file    Gets together: Not on file    Attends religious service: Not on file    Active member of club or organization: Not on file    Attends meetings of clubs or organizations: Not on file    Relationship status: Not on file  Other Topics Concern  . Not on file  Social History Narrative  . Not on file    Family History  Problem Relation Age of Onset  . Heart disease Father   . Rheumatologic disease Brother   . Cancer Brother        Leukemia  . Rheumatologic disease Sister   . Breast cancer Daughter     Review of Systems  Constitutional: Negative for chills and fever.  Eyes: Negative for visual disturbance.  Cardiovascular: Negative for chest pain and palpitations.  Gastrointestinal: Negative for nausea.  Neurological: Negative for dizziness, light-headedness and headaches.  Psychiatric/Behavioral: Negative for confusion.       Objective:   Vitals:   02/28/18 1444  BP: 130/72  Pulse: 100  Resp: 16  Temp: 98.8 F (37.1 C)    SpO2: 92%   BP Readings from Last 3 Encounters:  02/28/18 130/72  01/03/18 120/60  12/06/17 122/62   Wt Readings from Last 3 Encounters:  12/06/17 146 lb (66.2 kg)  11/02/17 146 lb (66.2 kg)  08/21/17 145 lb 12.8 oz (66.1 kg)   Body mass index is 23.57 kg/m.   Physical Exam  Constitutional: She appears well-developed and well-nourished. No distress.  HENT:  Head: Normocephalic.  Ecchymosis left side of eye w/o swelling  Eyes: Conjunctivae and EOM are normal.  Musculoskeletal: She exhibits no edema.  Skin: She is not diaphoretic.  Ecchymosis left forearm, bandaid on right elbow, ecchymosis b/l knees with small abrasion w/o discharge or bleeding left knee; left arm with areas large skin tears, no swelling, minimal bleeding, no pus or surrounding erythema         Assessment & Plan:    See Problem List for Assessment and Plan of chronic medical problems.

## 2018-02-28 NOTE — Assessment & Plan Note (Signed)
No evidence of infection Needs home care for wound care  Home health nurse ordered Wounds are very painful - she will start taking tylenol three times a day.  Can not take nsaids.  Side effects from codeine If pain is not controlled she will call

## 2018-02-28 NOTE — Assessment & Plan Note (Signed)
Likely mechanical fall Unable to get walker into bathroom - discussed fall prevention

## 2018-02-28 NOTE — Patient Instructions (Addendum)
A nurse will be calling to come out to your house... She will change your bandage.  Take the tylenol three times a day.

## 2018-03-02 ENCOUNTER — Telehealth: Payer: Self-pay | Admitting: Internal Medicine

## 2018-03-02 DIAGNOSIS — Z9181 History of falling: Secondary | ICD-10-CM | POA: Diagnosis not present

## 2018-03-02 DIAGNOSIS — G629 Polyneuropathy, unspecified: Secondary | ICD-10-CM | POA: Diagnosis not present

## 2018-03-02 DIAGNOSIS — Z7951 Long term (current) use of inhaled steroids: Secondary | ICD-10-CM | POA: Diagnosis not present

## 2018-03-02 DIAGNOSIS — F419 Anxiety disorder, unspecified: Secondary | ICD-10-CM | POA: Diagnosis not present

## 2018-03-02 DIAGNOSIS — M8580 Other specified disorders of bone density and structure, unspecified site: Secondary | ICD-10-CM | POA: Diagnosis not present

## 2018-03-02 DIAGNOSIS — J439 Emphysema, unspecified: Secondary | ICD-10-CM | POA: Diagnosis not present

## 2018-03-02 DIAGNOSIS — Z8701 Personal history of pneumonia (recurrent): Secondary | ICD-10-CM | POA: Diagnosis not present

## 2018-03-02 DIAGNOSIS — Z85118 Personal history of other malignant neoplasm of bronchus and lung: Secondary | ICD-10-CM | POA: Diagnosis not present

## 2018-03-02 DIAGNOSIS — M1711 Unilateral primary osteoarthritis, right knee: Secondary | ICD-10-CM | POA: Diagnosis not present

## 2018-03-02 DIAGNOSIS — M5030 Other cervical disc degeneration, unspecified cervical region: Secondary | ICD-10-CM | POA: Diagnosis not present

## 2018-03-02 DIAGNOSIS — Z87891 Personal history of nicotine dependence: Secondary | ICD-10-CM | POA: Diagnosis not present

## 2018-03-02 DIAGNOSIS — S81001D Unspecified open wound, right knee, subsequent encounter: Secondary | ICD-10-CM | POA: Diagnosis not present

## 2018-03-02 DIAGNOSIS — M7062 Trochanteric bursitis, left hip: Secondary | ICD-10-CM | POA: Diagnosis not present

## 2018-03-02 DIAGNOSIS — M5136 Other intervertebral disc degeneration, lumbar region: Secondary | ICD-10-CM | POA: Diagnosis not present

## 2018-03-02 DIAGNOSIS — J9611 Chronic respiratory failure with hypoxia: Secondary | ICD-10-CM | POA: Diagnosis not present

## 2018-03-02 DIAGNOSIS — Z9981 Dependence on supplemental oxygen: Secondary | ICD-10-CM | POA: Diagnosis not present

## 2018-03-02 DIAGNOSIS — E039 Hypothyroidism, unspecified: Secondary | ICD-10-CM | POA: Diagnosis not present

## 2018-03-02 DIAGNOSIS — S51812D Laceration without foreign body of left forearm, subsequent encounter: Secondary | ICD-10-CM | POA: Diagnosis not present

## 2018-03-02 DIAGNOSIS — M7061 Trochanteric bursitis, right hip: Secondary | ICD-10-CM | POA: Diagnosis not present

## 2018-03-02 DIAGNOSIS — K219 Gastro-esophageal reflux disease without esophagitis: Secondary | ICD-10-CM | POA: Diagnosis not present

## 2018-03-02 NOTE — Telephone Encounter (Signed)
Copied from [place] [id number]. Topic: Inquiry >> Mar 02, 2018  3:50 PM Oliver Pila B wrote: Reason for CRM: [facility] for verbals for nursing for wound care and an ote eval; contact [phone] and/or [phone]

## 2018-03-02 NOTE — Telephone Encounter (Signed)
Gave verbal orders per Dr. Burns 

## 2018-03-06 IMAGING — DX DG CHEST 2V
2 series · 2 of 2 positions shown · non-contrast
Comparison: Chest x-ray of December 16, 2015

CLINICAL DATA: COPD with increase cough and shortness of breath.
History of right lung malignancy treated with upper lobectomy.

EXAM:
CHEST  2 VIEW

[chest pa]
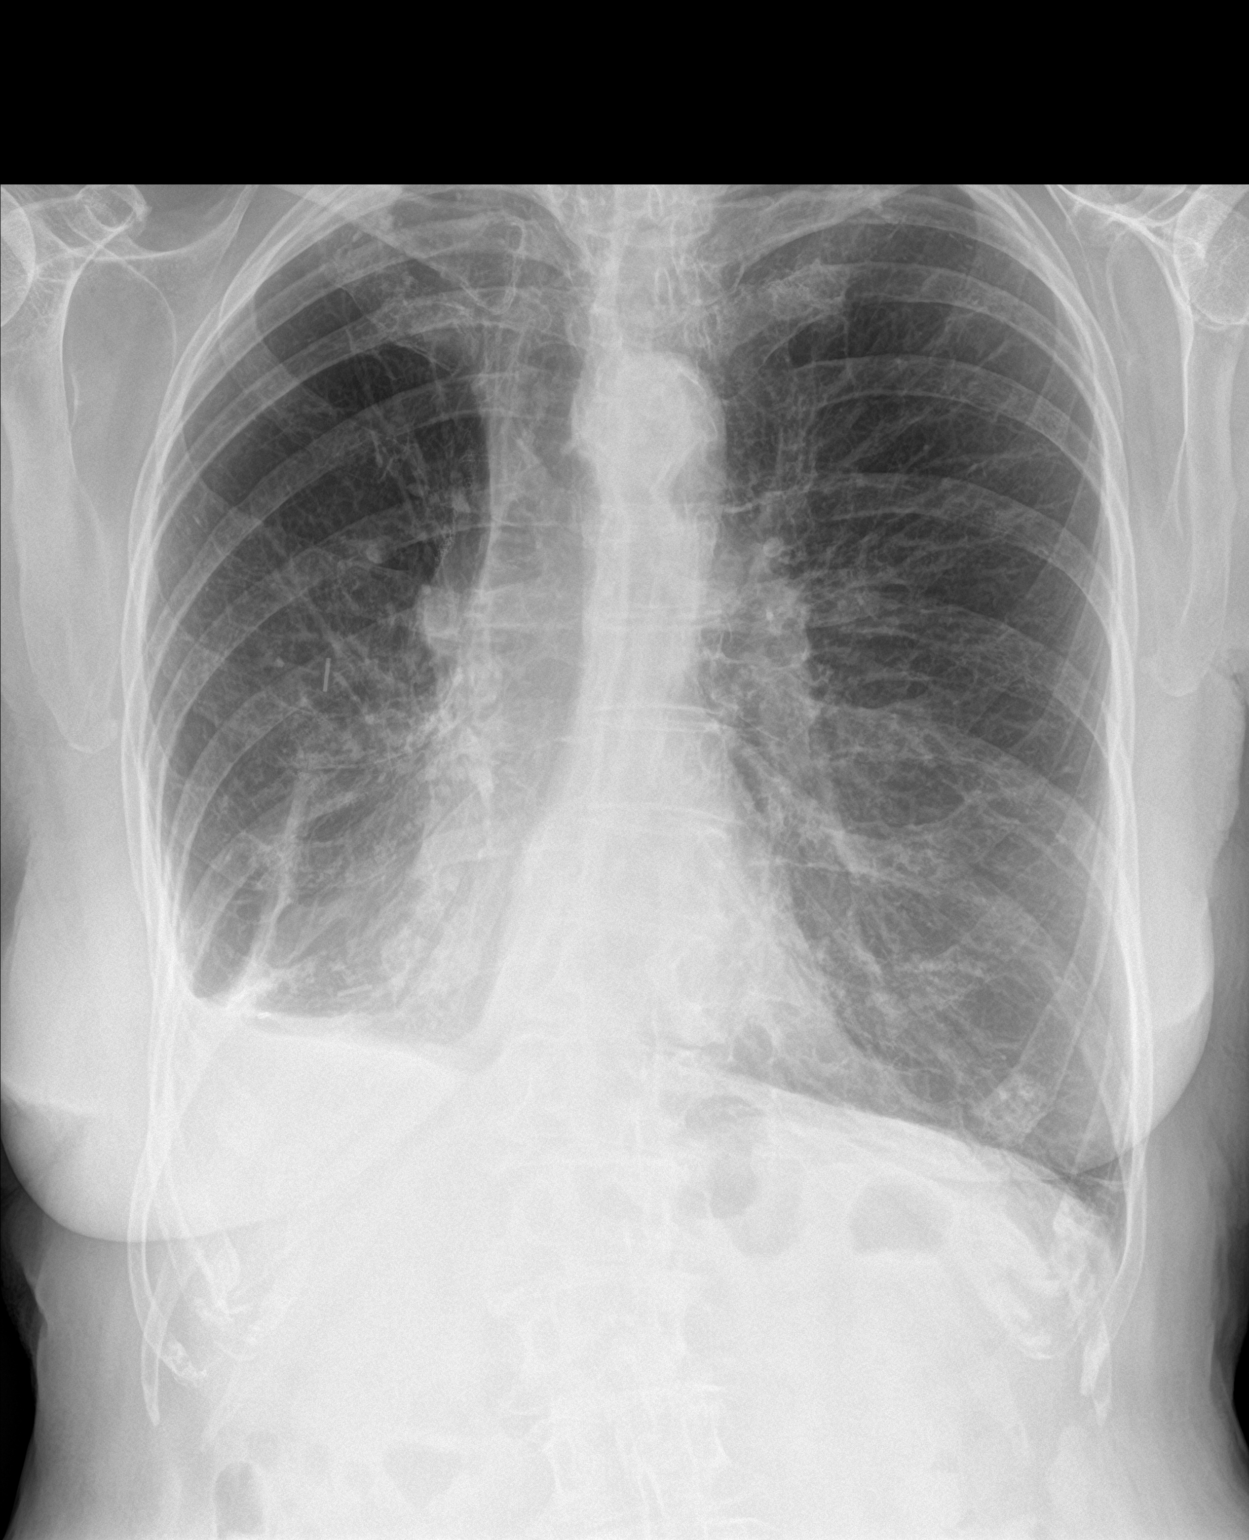

[chest lat]
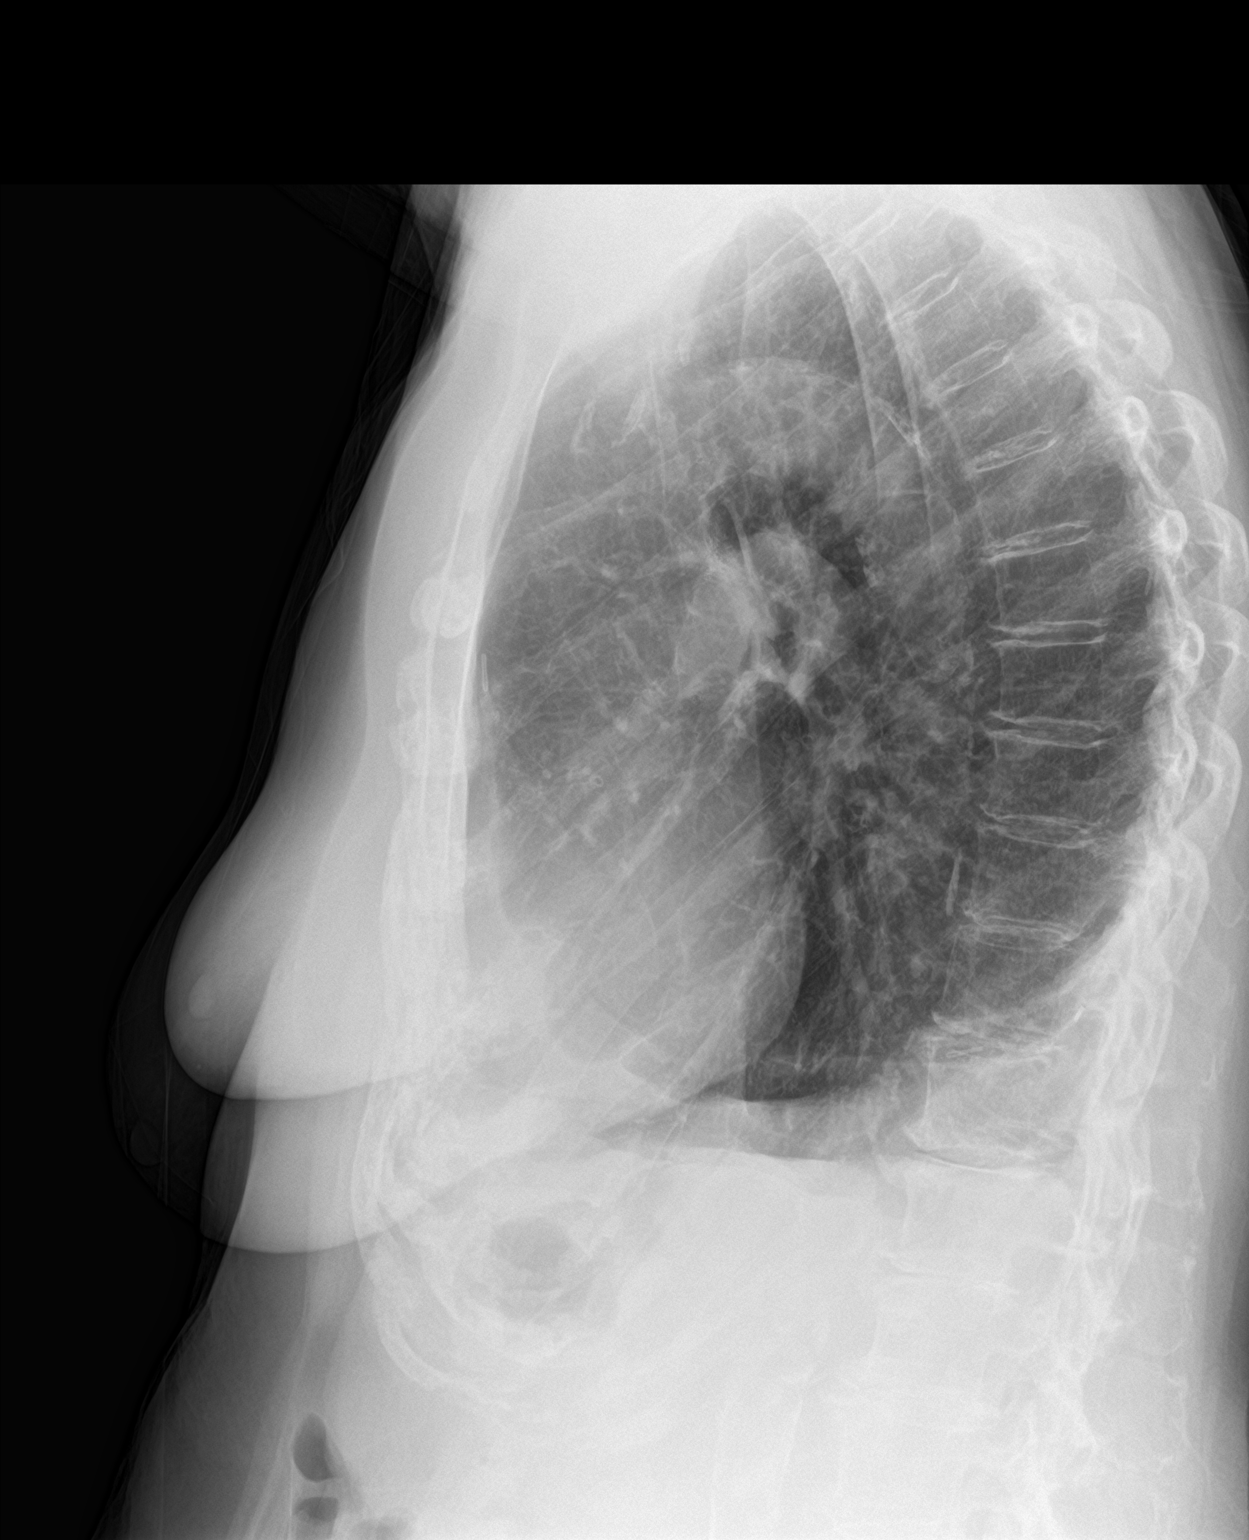

[2 of 2 positions shown; findings below may reference images not displayed]

FINDINGS: The lungs are mildly hyperinflated. Stable volume loss at the right
lung base is present with pleural thickening. There is stable
biapical pleural thickening as well. There is no alveolar infiltrate
or pleural effusion. The heart and pulmonary vascularity are normal.
There is calcification in the wall of the thoracic aorta. The bony
thorax exhibits no acute abnormality.
IMPRESSION: COPD. Chronic pleuroparenchymal changes on the right from previous
lobectomy. No evidence of recurrent malignancy. No acute pneumonia
nor CHF.

Thoracic aortic atherosclerosis.

## 2018-03-07 ENCOUNTER — Telehealth: Payer: Self-pay | Admitting: Internal Medicine

## 2018-03-07 NOTE — Telephone Encounter (Signed)
Copied from [place] [id number]. Topic: General - Other >> Mar 07, 2018  4:19 PM Margot Ables wrote: [facility] with [facility] asking about changing the dressing on wound on pts left arm. There is redness around the border of the wound. It is a little more swollen than last visit. Pt states feels like pins and needles. Please call to advise if dressing may cause this reaction, if they should try alternative dressing, if pt needs OV.

## 2018-03-08 NOTE — Telephone Encounter (Signed)
Please advise 

## 2018-03-08 NOTE — Telephone Encounter (Signed)
Spoke with [name] and she advised that she redressed pts arm and it felt much better. Did not have the pins and needles feeling or as much pain. Will monitor and come in if sxs get worse.

## 2018-03-08 NOTE — Telephone Encounter (Signed)
I would suggest trying a different dressing and monitor closely - if getting worse needs to be seen.

## 2018-03-11 DIAGNOSIS — J479 Bronchiectasis, uncomplicated: Secondary | ICD-10-CM | POA: Diagnosis not present

## 2018-03-12 DIAGNOSIS — S81001D Unspecified open wound, right knee, subsequent encounter: Secondary | ICD-10-CM | POA: Diagnosis not present

## 2018-03-12 DIAGNOSIS — S51812D Laceration without foreign body of left forearm, subsequent encounter: Secondary | ICD-10-CM | POA: Diagnosis not present

## 2018-03-12 NOTE — Progress Notes (Signed)
Subjective:    Patient ID: [name], female    DOB: 11/07/1932, 84 y.o.   MRN: [id number]  HPI The patient is here for follow up of her left arm wound/skin tear.  Her daughter is here with her.  She has home health nursing coming to do her dressing changes.  There has been calls from the home care nurse not sure what to use for her dressing changes because of the dressings are sticking no matter what they use and there is concern there irritating her skin.  She has a pins-and-needles sensation up and down the arm intermittently.  She has pain all the time that is worse with movement.  She is concerned about a possible infection.  She states there is an area of puffiness and redness in the distal arm.  The wounds in the upper arm have healed slightly, but the lower arm wounds have not shown much improvement.  She denies any fevers or chills.  She does have intermittent nausea because of the pain and has taken some of the Zofran, which has helped.    Medications and allergies reviewed with patient and updated if appropriate.  Patient Active Problem List   Diagnosis Date Noted  . Fall 02/28/2018  . Skin tear of left forearm without complication 86/57/8469  . Degenerative disc disease, cervical 12/06/2017  . DOE (dyspnea on exertion) 11/21/2017  . Fatigue 11/02/2017  . Bilateral swelling of feet 11/02/2017  . Right-sided chest pain 06/02/2017  . Osteopenia 02/16/2017  . Hyperglycemia 12/08/2016  . Degenerative arthritis of right knee 10/27/2016  . 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 Greater trochanteric bursitis of both hips 04/08/2016  . Pain in both knees 04/08/2016  . Chronic midline low back pain without sciatica 04/08/2016  . Dysuria 12/02/2015  . Hip pain 12/02/2015  . Chronic respiratory failure with hypoxia ([place]) 03/07/2015  . Diverticulitis 05/24/2014  . Anxiety   . Bladder prolapse, female, acquired   . Arthritis   .  Non-small cell carcinoma of lung, stage 1 ([place]) 01/10/2012  . Neuropathy, peripheral 01/28/2011  . COPD mixed type ([place]) 09/08/2010  . Dyslipidemia   . ATONY OF BLADDER 08/10/2009  . Hypothyroidism 03/12/2008  . Seasonal allergic rhinitis 10/10/2007    Current Outpatient Medications on File Prior to Visit  Medication Sig Dispense Refill  . acetaminophen (TYLENOL ARTHRITIS PAIN) 650 MG CR tablet Take 650 mg by mouth daily as needed for pain.     [name] ALPRAZolam (XANAX) 1 MG tablet TAKE 1 TABLET (1 MG TOTAL) BY MOUTH AT BEDTIME. -- OFFICE VISIT NEEDED FOR FURTHER REFILLS 30 tablet 1  . atorvastatin (LIPITOR) 20 MG tablet TAKE 1 TABLET (20 MG TOTAL) BY MOUTH DAILY. (Patient taking differently: Take 20 mg by mouth every other day. ) 90 tablet 1  . Cholecalciferol (VITAMIN D) 2000 UNITS tablet Take 1 tablet (2,000 Units total) by mouth daily. 30 tablet 11  . FLOVENT HFA 110 MCG/ACT inhaler TAKE 2 PUFFS BY MOUTH TWICE A DAY 12 Inhaler 5  . gabapentin (NEURONTIN) 100 MG capsule Take 2 capsules (200 mg total) by mouth 3 (three) times daily. (Patient taking differently: Take 200 mg by mouth once. ) 540 capsule 1  . levalbuterol (XOPENEX) 1.25 MG/3ML nebulizer solution USE 1 NEBULE EVERY 8 HOURS AS NEEDED FOR WHEEZING (Patient taking differently: USE 1 NEBULE ONCE A DAY AS NEED AS NEEDED FOR WHEEZING) 360 mL 2  . levothyroxine (SYNTHROID, LEVOTHROID) 88  MCG tablet TAKE 1 TABLET BY MOUTH EVERY DAY 90 tablet 1  . Multiple Vitamins-Minerals (CENTRUM SILVER PO) Take 1 tablet by mouth daily.     . ondansetron (ZOFRAN) 4 MG tablet Take 1 tablet (4 mg total) by mouth every 8 (eight) hours as needed for nausea or vomiting. 20 tablet 0  . Probiotic Product (PROBIOTIC PO) Take 1 capsule by mouth daily.    [name] Respiratory Therapy Supplies (FLUTTER) DEVI Blow through 4 times per cycle and repeat 3 cycles per day 1 each 0   No current facility-administered medications on file prior to visit.     Past Medical History:   Diagnosis Date  . Allergic rhinitis   . Anxiety   . Arthritis    "back, hands; hips" (12/22/2014)  . Asthma   . Bladder atony   . Chronic bronchitis ([place])   . COPD (chronic obstructive pulmonary disease) ([place])   . Diarrhea   . Emphysema of lung ([place])   . GERD (gastroesophageal reflux disease)    "w/spicey foods" (12/22/2014)  . Hyperlipidemia   . Non-small cell carcinoma of lung, stage 1 ([place]) 2005   1.4 cm Poorly differentiated Squamous cell RUL  T1N0 resected 09/22/2003  . On home oxygen therapy    "3L q night and prn during the day" (12/22/2014)  . Pelvic cyst   . Pneumonia    "this is the 3rd time that I can remember" (12/22/2014)  . Unspecified hypothyroidism     Past Surgical History:  Procedure Laterality Date  . ABDOMINAL HYSTERECTOMY  ~ 1976  . BACK SURGERY    . CATARACT EXTRACTION W/ INTRAOCULAR LENS  IMPLANT, BILATERAL Bilateral ~ 2012  . DILATION AND CURETTAGE OF UTERUS    . LUMBAR DISC SURGERY  1970's  . LYMPH NODE DISSECTION  2005   "all my lymph nodes under breasts, went thru my back; Dr. Arlyce Dice did it"  . Needle aspiration Pelvic cyst  2011   Dr Diona Fanti  . VIDEO ASSISTED THORACOSCOPY (VATS)/THOROCOTOMY Right 09/2003   thoracotomy, right upper lobectomy with node dissection; Dr Demetrios Loll 11/02/2010  . VIDEO BRONCHOSCOPY  03/2004   Archie Endo 11/02/2010    Social History   Socioeconomic History  . Marital status: Married    Spouse name: Not on file  . Number of children: Not on file  . Years of education: Not on file  . Highest education level: Not on file  Occupational History  . Occupation: Retired Animator, Psychiatrist: RETIRED  Social Needs  . Financial resource strain: Not on file  . Food insecurity:    Worry: Not on file    Inability: Not on file  . Transportation needs:    Medical: Not on file    Non-medical: Not on file  Tobacco Use  . Smoking status: Former Smoker    Packs/day: 1.00    Years: 42.00    Pack years: 42.00      Types: Cigarettes    Last attempt to quit: 09/22/2003    Years since quitting: 14.4  . Smokeless tobacco: Never Used  Substance and Sexual Activity  . Alcohol use: Yes    Alcohol/week: 14.0 standard drinks    Types: 14 Glasses of wine per week    Comment: 12/22/2014 "I have a large glass of wine qd"  . Drug use: No  . Sexual activity: Never  Lifestyle  . Physical activity:    Days per week: Not on file    Minutes  per session: Not on file  . Stress: Not on file  Relationships  . Social connections:    Talks on phone: Not on file    Gets together: Not on file    Attends religious service: Not on file    Active member of club or organization: Not on file    Attends meetings of clubs or organizations: Not on file    Relationship status: Not on file  Other Topics Concern  . Not on file  Social History Narrative  . Not on file    Family History  Problem Relation Age of Onset  . Heart disease Father   . Rheumatologic disease Brother   . Cancer Brother        Leukemia  . Rheumatologic disease Sister   . Breast cancer Daughter     Review of Systems  Constitutional: Negative for chills and fever.  Skin: Positive for wound.  Neurological: Negative for numbness (pins and needles).       Objective:   Vitals:   03/13/18 1305  BP: (!) 142/72  Pulse: 88  Resp: 16  Temp: 98.7 F (37.1 C)  SpO2: 95%   BP Readings from Last 3 Encounters:  03/13/18 (!) 142/72  02/28/18 130/72  01/03/18 120/60   Wt Readings from Last 3 Encounters:  12/06/17 146 lb (66.2 kg)  11/02/17 146 lb (66.2 kg)  08/21/17 145 lb 12.8 oz (66.1 kg)   Body mass index is 23.57 kg/m.   Physical Exam    Constitutional: Appears well-developed and well-nourished.  In moderate discomfort when removing bandage from left arm Left arm: 3 smaller skin tears left elbow region and upper arm have a good amount of granulation tissue, some scabbing around the edges, no active bleeding or discharge, slight  erythema around edges without swelling or fluctuance.  Large skin tear that takes up most of her posterior left forearm is bleeding slightly since bandage was removed, no pus but slight greenish discharge, some granulation tissue and scabbing around the edges, slight erythema and swelling around the edges, extreme tenderness Normal sensation, any movement causing pain      Assessment & Plan:    > 30 minutes were spent face-to-face with the patient, over 50% of which was spent doing wound care - removing left arm bandage that required several vials of saline to remove bandage.  Wound evaluated and redressed with antibacterial ointment, non-stick pads and wrapped in gauze.  Referral for wound clinic    See Problem List for Assessment and Plan of chronic medical problems.

## 2018-03-13 ENCOUNTER — Ambulatory Visit (INDEPENDENT_AMBULATORY_CARE_PROVIDER_SITE_OTHER): Payer: PPO | Admitting: Internal Medicine

## 2018-03-13 ENCOUNTER — Encounter: Payer: Self-pay | Admitting: Internal Medicine

## 2018-03-13 VITALS — BP 142/72 | HR 88 | Temp 98.7°F | Resp 16 | Ht 66.0 in

## 2018-03-13 DIAGNOSIS — S51812D Laceration without foreign body of left forearm, subsequent encounter: Secondary | ICD-10-CM | POA: Diagnosis not present

## 2018-03-13 MED ORDER — ONDANSETRON HCL 4 MG PO TABS: 4.0000 mg | ORAL_TABLET | Freq: Three times a day (TID) | ORAL | 0 refills | Status: DC | PRN

## 2018-03-13 NOTE — Patient Instructions (Signed)
A referral was ordered for the wound care center.  They will call you to schedule an appointment.

## 2018-03-13 NOTE — Assessment & Plan Note (Signed)
She fell on 9/11 in her bathroom.  Left arm with significant skin tear Upper portion of arm is healing as expected, but distal arm wound does not seem to be healing as expected No obvious infection and I hesitate to put her on any antibiotics because most she does not tolerate.  Minimal erythema around the edges appeared to be normal for healing.  Explains that the mild swelling in the distal forearm is dependent edema Will apply antibacterial ointment and nonstick pads and wrapping gauze. Will refer to wound clinic for their opinion regarding wound care Has home health nurse coming twice a week-next visit probably Thursday Continue antibacterial ointment with nonstick dressing

## 2018-03-14 ENCOUNTER — Telehealth: Payer: Self-pay | Admitting: Internal Medicine

## 2018-03-14 MED ORDER — MEDIHONEY WOUND/BURN DRESSING EX GEL: CUTANEOUS | 5 refills | Status: DC

## 2018-03-14 NOTE — Telephone Encounter (Signed)
Lets try Medihoney -- it is a topical wound healing medication -- sent to [facility]

## 2018-03-14 NOTE — Telephone Encounter (Signed)
Pt aware of new medication. She will try calling the nurse that comes to help with her wound care to see when she can come help with a new dressing.

## 2018-03-14 NOTE — Telephone Encounter (Signed)
Was speaking to pt about referral and she states antibiotic cream bandage you applied to her wound "gave her such a fit". She states it was burning & itching and its very painful.

## 2018-03-15 ENCOUNTER — Telehealth: Payer: Self-pay | Admitting: Internal Medicine

## 2018-03-15 NOTE — Telephone Encounter (Signed)
Spoke with pt and advised that her pharmacy did not carry her medihoney and that she would probably need to order it online or check with [facility]. I did let pt know that there were no dressing changes other than the gel.

## 2018-03-15 NOTE — Telephone Encounter (Signed)
Copied from [place]. Topic: Quick Communication - See Telephone Encounter >> Mar 15, 2018 11:50 AM Ahmed Prima L wrote: CRM for notification. See Telephone encounter for: 03/15/18.  Lavelle, RN with [facility] called to see what the new orders were for the dressing changes. She has not received any new orders yet & the patient stated to her that Dr Quay Burow changed that at her office visit on 9/24.  Call back @ [phone]

## 2018-03-15 NOTE — Telephone Encounter (Signed)
LVM for [name] letting her know a new gel was sent to the pharmacy that they did not carry but as far as dressing changes there were none. Asked her to call back with any additional question or concerns if needed.

## 2018-03-16 ENCOUNTER — Telehealth: Payer: Self-pay | Admitting: Internal Medicine

## 2018-03-16 MED ORDER — ALPRAZOLAM 1 MG PO TABS: 1.0000 mg | ORAL_TABLET | Freq: Every day | ORAL | 1 refills | Status: DC

## 2018-03-16 NOTE — Telephone Encounter (Signed)
sent 

## 2018-03-16 NOTE — Telephone Encounter (Signed)
Check [place] registry last filled 02/14/2018. MD is out of the office 24-48 hours refill protocol. Will hold until MD return Monday for approval../lmb

## 2018-03-16 NOTE — Telephone Encounter (Signed)
Copied from [place] [phone]. Topic: Quick Communication - Rx Refill/Question >> Mar 16, 2018  9:23 AM Burchel, Abbi R wrote: Medication: ALPRAZolam Duanne Moron) 1 MG tablet  [facility]erred Pharmacy: [id number] [address] [place] [address] Phone: [phone] Fax: [phone]    Pt was advised that RX refills may take up to 3 business days. Pt only has 1 dose left.

## 2018-03-17 DIAGNOSIS — J449 Chronic obstructive pulmonary disease, unspecified: Secondary | ICD-10-CM | POA: Diagnosis not present

## 2018-03-17 DIAGNOSIS — J9611 Chronic respiratory failure with hypoxia: Secondary | ICD-10-CM | POA: Diagnosis not present

## 2018-03-21 DIAGNOSIS — M8580 Other specified disorders of bone density and structure, unspecified site: Secondary | ICD-10-CM

## 2018-03-21 DIAGNOSIS — K219 Gastro-esophageal reflux disease without esophagitis: Secondary | ICD-10-CM

## 2018-03-21 DIAGNOSIS — S51812D Laceration without foreign body of left forearm, subsequent encounter: Secondary | ICD-10-CM | POA: Diagnosis not present

## 2018-03-21 DIAGNOSIS — Z9981 Dependence on supplemental oxygen: Secondary | ICD-10-CM

## 2018-03-21 DIAGNOSIS — J9611 Chronic respiratory failure with hypoxia: Secondary | ICD-10-CM | POA: Diagnosis not present

## 2018-03-21 DIAGNOSIS — Z85118 Personal history of other malignant neoplasm of bronchus and lung: Secondary | ICD-10-CM

## 2018-03-21 DIAGNOSIS — E039 Hypothyroidism, unspecified: Secondary | ICD-10-CM | POA: Diagnosis not present

## 2018-03-21 DIAGNOSIS — F419 Anxiety disorder, unspecified: Secondary | ICD-10-CM | POA: Diagnosis not present

## 2018-03-21 DIAGNOSIS — M5136 Other intervertebral disc degeneration, lumbar region: Secondary | ICD-10-CM | POA: Diagnosis not present

## 2018-03-21 DIAGNOSIS — J439 Emphysema, unspecified: Secondary | ICD-10-CM | POA: Diagnosis not present

## 2018-03-21 DIAGNOSIS — G629 Polyneuropathy, unspecified: Secondary | ICD-10-CM | POA: Diagnosis not present

## 2018-03-21 DIAGNOSIS — M5030 Other cervical disc degeneration, unspecified cervical region: Secondary | ICD-10-CM

## 2018-03-21 DIAGNOSIS — M7061 Trochanteric bursitis, right hip: Secondary | ICD-10-CM | POA: Diagnosis not present

## 2018-03-21 DIAGNOSIS — S81001D Unspecified open wound, right knee, subsequent encounter: Secondary | ICD-10-CM | POA: Diagnosis not present

## 2018-03-21 DIAGNOSIS — Z8701 Personal history of pneumonia (recurrent): Secondary | ICD-10-CM

## 2018-03-21 DIAGNOSIS — M1711 Unilateral primary osteoarthritis, right knee: Secondary | ICD-10-CM

## 2018-03-21 DIAGNOSIS — Z7951 Long term (current) use of inhaled steroids: Secondary | ICD-10-CM

## 2018-03-21 DIAGNOSIS — Z9181 History of falling: Secondary | ICD-10-CM

## 2018-03-21 DIAGNOSIS — M7062 Trochanteric bursitis, left hip: Secondary | ICD-10-CM

## 2018-03-21 DIAGNOSIS — Z87891 Personal history of nicotine dependence: Secondary | ICD-10-CM

## 2018-03-27 ENCOUNTER — Encounter (HOSPITAL_BASED_OUTPATIENT_CLINIC_OR_DEPARTMENT_OTHER): Payer: PPO | Attending: Internal Medicine

## 2018-03-27 ENCOUNTER — Ambulatory Visit: Payer: PPO | Admitting: Family Medicine

## 2018-03-27 DIAGNOSIS — E119 Type 2 diabetes mellitus without complications: Secondary | ICD-10-CM | POA: Diagnosis not present

## 2018-03-27 DIAGNOSIS — J449 Chronic obstructive pulmonary disease, unspecified: Secondary | ICD-10-CM | POA: Diagnosis not present

## 2018-03-27 DIAGNOSIS — W19XXXA Unspecified fall, initial encounter: Secondary | ICD-10-CM | POA: Insufficient documentation

## 2018-03-27 DIAGNOSIS — Z87891 Personal history of nicotine dependence: Secondary | ICD-10-CM | POA: Insufficient documentation

## 2018-03-27 DIAGNOSIS — S51812A Laceration without foreign body of left forearm, initial encounter: Secondary | ICD-10-CM | POA: Diagnosis not present

## 2018-03-29 DIAGNOSIS — Z9981 Dependence on supplemental oxygen: Secondary | ICD-10-CM | POA: Diagnosis not present

## 2018-03-29 DIAGNOSIS — G629 Polyneuropathy, unspecified: Secondary | ICD-10-CM | POA: Diagnosis not present

## 2018-03-29 DIAGNOSIS — J439 Emphysema, unspecified: Secondary | ICD-10-CM | POA: Diagnosis not present

## 2018-03-29 DIAGNOSIS — F419 Anxiety disorder, unspecified: Secondary | ICD-10-CM | POA: Diagnosis not present

## 2018-03-29 DIAGNOSIS — M1711 Unilateral primary osteoarthritis, right knee: Secondary | ICD-10-CM | POA: Diagnosis not present

## 2018-03-29 DIAGNOSIS — M7061 Trochanteric bursitis, right hip: Secondary | ICD-10-CM | POA: Diagnosis not present

## 2018-03-29 DIAGNOSIS — M5030 Other cervical disc degeneration, unspecified cervical region: Secondary | ICD-10-CM | POA: Diagnosis not present

## 2018-03-29 DIAGNOSIS — E039 Hypothyroidism, unspecified: Secondary | ICD-10-CM | POA: Diagnosis not present

## 2018-03-29 DIAGNOSIS — Z7951 Long term (current) use of inhaled steroids: Secondary | ICD-10-CM | POA: Diagnosis not present

## 2018-03-29 DIAGNOSIS — M5136 Other intervertebral disc degeneration, lumbar region: Secondary | ICD-10-CM | POA: Diagnosis not present

## 2018-03-29 DIAGNOSIS — M7062 Trochanteric bursitis, left hip: Secondary | ICD-10-CM | POA: Diagnosis not present

## 2018-03-29 DIAGNOSIS — J9611 Chronic respiratory failure with hypoxia: Secondary | ICD-10-CM | POA: Diagnosis not present

## 2018-03-29 DIAGNOSIS — Z8701 Personal history of pneumonia (recurrent): Secondary | ICD-10-CM | POA: Diagnosis not present

## 2018-03-29 DIAGNOSIS — Z87891 Personal history of nicotine dependence: Secondary | ICD-10-CM | POA: Diagnosis not present

## 2018-03-29 DIAGNOSIS — K219 Gastro-esophageal reflux disease without esophagitis: Secondary | ICD-10-CM | POA: Diagnosis not present

## 2018-03-29 DIAGNOSIS — S51812D Laceration without foreign body of left forearm, subsequent encounter: Secondary | ICD-10-CM | POA: Diagnosis not present

## 2018-03-29 DIAGNOSIS — M8580 Other specified disorders of bone density and structure, unspecified site: Secondary | ICD-10-CM | POA: Diagnosis not present

## 2018-03-29 DIAGNOSIS — Z85118 Personal history of other malignant neoplasm of bronchus and lung: Secondary | ICD-10-CM | POA: Diagnosis not present

## 2018-04-06 DIAGNOSIS — S51812A Laceration without foreign body of left forearm, initial encounter: Secondary | ICD-10-CM | POA: Diagnosis not present

## 2018-04-09 DIAGNOSIS — S81001D Unspecified open wound, right knee, subsequent encounter: Secondary | ICD-10-CM | POA: Diagnosis not present

## 2018-04-09 DIAGNOSIS — S51812D Laceration without foreign body of left forearm, subsequent encounter: Secondary | ICD-10-CM | POA: Diagnosis not present

## 2018-04-10 DIAGNOSIS — J479 Bronchiectasis, uncomplicated: Secondary | ICD-10-CM | POA: Diagnosis not present

## 2018-04-13 DIAGNOSIS — S81001D Unspecified open wound, right knee, subsequent encounter: Secondary | ICD-10-CM | POA: Diagnosis not present

## 2018-04-13 DIAGNOSIS — S51812D Laceration without foreign body of left forearm, subsequent encounter: Secondary | ICD-10-CM | POA: Diagnosis not present

## 2018-04-16 DIAGNOSIS — J449 Chronic obstructive pulmonary disease, unspecified: Secondary | ICD-10-CM | POA: Diagnosis not present

## 2018-04-16 DIAGNOSIS — J9611 Chronic respiratory failure with hypoxia: Secondary | ICD-10-CM | POA: Diagnosis not present

## 2018-04-24 ENCOUNTER — Encounter (HOSPITAL_BASED_OUTPATIENT_CLINIC_OR_DEPARTMENT_OTHER): Payer: PPO | Attending: Internal Medicine

## 2018-04-24 DIAGNOSIS — J449 Chronic obstructive pulmonary disease, unspecified: Secondary | ICD-10-CM | POA: Insufficient documentation

## 2018-04-24 DIAGNOSIS — W19XXXA Unspecified fall, initial encounter: Secondary | ICD-10-CM | POA: Diagnosis not present

## 2018-04-24 DIAGNOSIS — S51812A Laceration without foreign body of left forearm, initial encounter: Secondary | ICD-10-CM | POA: Insufficient documentation

## 2018-05-02 ENCOUNTER — Ambulatory Visit (INDEPENDENT_AMBULATORY_CARE_PROVIDER_SITE_OTHER): Payer: PPO | Admitting: Neurology

## 2018-05-02 ENCOUNTER — Encounter

## 2018-05-02 ENCOUNTER — Encounter: Payer: Self-pay | Admitting: Neurology

## 2018-05-02 VITALS — BP 120/68 | HR 95 | Ht 65.0 in | Wt 142.5 lb

## 2018-05-02 DIAGNOSIS — M25551 Pain in right hip: Secondary | ICD-10-CM | POA: Diagnosis not present

## 2018-05-02 DIAGNOSIS — G8929 Other chronic pain: Secondary | ICD-10-CM | POA: Diagnosis not present

## 2018-05-02 DIAGNOSIS — G629 Polyneuropathy, unspecified: Secondary | ICD-10-CM

## 2018-05-02 DIAGNOSIS — M25561 Pain in right knee: Secondary | ICD-10-CM

## 2018-05-02 DIAGNOSIS — R29898 Other symptoms and signs involving the musculoskeletal system: Secondary | ICD-10-CM | POA: Diagnosis not present

## 2018-05-02 NOTE — Progress Notes (Signed)
[facility] Clinic Note - Initial Visit   Date: 05/02/18  [name] MRN: [id number] DOB: April 11, 1933   Dear Dr. Quay Burow:  Thank you for your kind referral of [name] for consultation of right leg weakness and pain. Although her history is well known to you, please allow [place] to reiterate it for the purpose of our medical record. The patient was accompanied to the clinic by husband and daughter who also provides collateral information.     History of Present Illness: [name] is a 84 y.o. Caucasian female with s/p lumbar laminectomy, hyperlipidemia, hypothyroidism, history of lung cancer s/p upper lobe resection, COPD on oxygen, and OA presenting for evaluation of right leg weakness and pain.    Starting in 2018, she began having low back pain and was seeing Dr. Hulan Saas who ordered MRI lumbar spine which showed advanced disc degenerative of the lumbar spine as well as mild multilevel foraminal stenosis.  She had ESI in June 2018 which helped alleviate her low back pain for several months.  She continues to have severe right hip and knee pain, described as achy and sharp. The pain limits her mobility and she started using a walker earlier this year.  Now, she uses a wheelchair for long distances.  She also complains that her right leg buckles and she has fallen.  Her weakness is always associated with her joint pain.  She does not have numbness/tingling that radiates into her legs.  Review of her chart shows that she had steroid injection to her right hip for trochanteric bursitis by Dr. Tamala Julian, but she did not have any relief, so did not follow-up with him again.  Out-side paper records, electronic medical record, and images have been reviewed where available and summarized as:  MRI lumbar spine 11/30/2016: 1. Advanced diffuse lumbar disc degeneration. Mild interval progression from T11-12-L3-4 with up to mild spinal, lateral recess, and  neural foraminal stenosis as above. 2. Unchanged disc and facet degeneration at L4-5 and L5-S1 with mild-to-moderate neural foraminal stenosis.  Lab Results  Component Value Date   TSH 4.05 11/02/2017   Lab Results  Component Value Date   HGBA1C 5.2 06/02/2017     Past Medical History:  Diagnosis Date  . Allergic rhinitis   . Anxiety   . Arthritis    "back, hands; hips" (12/22/2014)  . Asthma   . Bladder atony   . Chronic bronchitis ([place])   . COPD (chronic obstructive pulmonary disease) ([place])   . Diarrhea   . Emphysema of lung ([place])   . GERD (gastroesophageal reflux disease)    "w/spicey foods" (12/22/2014)  . Hyperlipidemia   . Non-small cell carcinoma of lung, stage 1 ([place]) 2005   1.4 cm Poorly differentiated Squamous cell RUL  T1N0 resected 09/22/2003  . On home oxygen therapy    "3L q night and prn during the day" (12/22/2014)  . Pelvic cyst   . Pneumonia    "this is the 3rd time that I can remember" (12/22/2014)  . Unspecified hypothyroidism     Past Surgical History:  Procedure Laterality Date  . ABDOMINAL HYSTERECTOMY  ~ 1976  . BACK SURGERY    . CATARACT EXTRACTION W/ INTRAOCULAR LENS  IMPLANT, BILATERAL Bilateral ~ 2012  . DILATION AND CURETTAGE OF UTERUS    . LUMBAR DISC SURGERY  1970's  . LYMPH NODE DISSECTION  2005   "all my lymph nodes under breasts, went thru my back; Dr. Arlyce Dice did  it"  . Needle aspiration Pelvic cyst  2011   Dr Diona Fanti  . VIDEO ASSISTED THORACOSCOPY (VATS)/THOROCOTOMY Right 09/2003   thoracotomy, right upper lobectomy with node dissection; Dr Demetrios Loll 11/02/2010  . VIDEO BRONCHOSCOPY  03/2004   Archie Endo 11/02/2010     Medications:  Outpatient Encounter Medications as of 05/02/2018  Medication Sig  . acetaminophen (TYLENOL ARTHRITIS PAIN) 650 MG CR tablet Take 650 mg by mouth daily as needed for pain.   [name] ALPRAZolam (XANAX) 1 MG tablet Take 1 tablet (1 mg total) by mouth at bedtime. -- Office visit needed for further refills  .  atorvastatin (LIPITOR) 20 MG tablet TAKE 1 TABLET (20 MG TOTAL) BY MOUTH DAILY. (Patient taking differently: Take 20 mg by mouth every other day. )  . Cholecalciferol (VITAMIN D) 2000 UNITS tablet Take 1 tablet (2,000 Units total) by mouth daily.  [name] FLOVENT HFA 110 MCG/ACT inhaler TAKE 2 PUFFS BY MOUTH TWICE A DAY  . gabapentin (NEURONTIN) 100 MG capsule Take 2 capsules (200 mg total) by mouth 3 (three) times daily. (Patient taking differently: Take 200 mg by mouth once. )  . levalbuterol (XOPENEX) 1.25 MG/3ML nebulizer solution USE 1 NEBULE EVERY 8 HOURS AS NEEDED FOR WHEEZING (Patient taking differently: USE 1 NEBULE ONCE A DAY AS NEED AS NEEDED FOR WHEEZING)  . levothyroxine (SYNTHROID, LEVOTHROID) 88 MCG tablet TAKE 1 TABLET BY MOUTH EVERY DAY  . Multiple Vitamins-Minerals (CENTRUM SILVER PO) Take 1 tablet by mouth daily.   . ondansetron (ZOFRAN) 4 MG tablet Take 1 tablet (4 mg total) by mouth every 8 (eight) hours as needed for nausea or vomiting.  . Probiotic Product (PROBIOTIC PO) Take 1 capsule by mouth daily.  [name] Respiratory Therapy Supplies (FLUTTER) DEVI Blow through 4 times per cycle and repeat 3 cycles per day  . Wound Dressings (MEDIHONEY WOUND/BURN DRESSING) GEL Apply to bandage for wounds   No facility-administered encounter medications on file as of 05/02/2018.      Allergies:  Allergies  Allergen Reactions  . Ampicillin Diarrhea and Nausea Only    GI upset  . Azithromycin Diarrhea and Nausea And Vomiting  . Codeine Nausea And Vomiting and Other (See Comments)    Makes her "crazy"  . Daliresp [Roflumilast] Nausea Only  . Nitrofurantoin Diarrhea and Nausea Only  . Other Other (See Comments)    No nuts and seeds because of diverticulitis  . Penicillins Nausea And Vomiting, Swelling and Rash    Has patient had a PCN reaction causing immediate rash, facial/tongue/throat swelling, SOB or lightheadedness with hypotension: Yes Has patient had a PCN reaction causing severe rash  involving mucus membranes or skin necrosis: No Has patient had a PCN reaction that required hospitalization No Has patient had a PCN reaction occurring within the last 10 years: No No "cillins" If all of the above answers are "NO", then may proceed with Cephalosporin use.   . Sulfonamide Derivatives Diarrhea and Nausea And Vomiting  . Tiotropium Bromide Monohydrate     Urinary retention  . Doxycycline Nausea Only  . Fludrocortisone Acetate Itching  . Lactose Intolerance (Gi) Other (See Comments)    Gas and bloating   . Latex Itching and Rash  . Prednisone Other (See Comments)    Insomnia after 2 days    Family History: Family History  Problem Relation Age of Onset  . Heart disease Father   . Rheumatologic disease Brother   . Cancer Brother        Leukemia  .  Rheumatologic disease Sister   . Breast cancer Daughter     Social History: Social History   Tobacco Use  . Smoking status: Former Smoker    Packs/day: 1.00    Years: 42.00    Pack years: 42.00    Types: Cigarettes    Last attempt to quit: 09/22/2003    Years since quitting: 14.6  . Smokeless tobacco: Never Used  Substance Use Topics  . Alcohol use: Yes    Alcohol/week: 14.0 standard drinks    Types: 14 Glasses of wine per week    Comment: 12/22/2014 "I have a large glass of wine qd"  . Drug use: No   Social History   Social History Narrative   Lives with husband in a one story home.  Has 2 children.  Retired.  Education: high school.     Review of Systems:  CONSTITUTIONAL: No fevers, chills, night sweats, or weight loss.   EYES: No visual changes or eye pain ENT: No hearing changes.  No history of nose bleeds.   RESPIRATORY: No cough, wheezing +shortness of breath.   CARDIOVASCULAR: Negative for chest pain, and palpitations.   GI: Negative for abdominal discomfort, blood in stools or black stools.  No recent change in bowel habits.   GU:  No history of incontinence.   MUSCLOSKELETAL: +history of joint  pain or swelling.  No myalgias.   SKIN: Negative for lesions, rash, and itching.   HEMATOLOGY/ONCOLOGY: Negative for prolonged bleeding, bruising easily, and swollen nodes.  +history of cancer.   ENDOCRINE: Negative for cold or heat intolerance, polydipsia or goiter.   PSYCH:  +depression or anxiety symptoms.   NEURO: As Above.   Vital Signs:  BP 120/68   Pulse 95   Ht 5\' 5"  (1.651 m)   Wt 142 lb 8 oz (64.6 kg)   SpO2 98%   BMI 23.71 kg/m    General Medical Exam:   General:  Frail-appearing, comfortable sitting in wheelchair   Eyes/ENT: see cranial nerve examination.   Neck:  No carotid bruits. Respiratory:  Reduced breath sounds bilaterally   Cardiac:  Regular rate and rhythm, no murmur.   Extremities:  Severely reduced right hip ROM with hip flexion due to pain.  She has localized point tenderness over the right lateral hip and posterior right knee.   Skin:  No rashes or lesions.  Neurological Exam: MENTAL STATUS including orientation to time, place, person, recent and remote memory, attention span and concentration, language, and fund of knowledge is normal.  Speech is not dysarthric.  CRANIAL NERVES: II:  No visual field defects.  Unremarkable fundi.   III-IV-VI: Pupils equal round and reactive to light.  Normal conjugate, extra-ocular eye movements in all directions of gaze.  No nystagmus.  No ptosis.   V:  Normal facial sensation   VII:  Normal facial symmetry and movements. VIII:  Normal hearing and vestibular function.   IX-X:  Normal palatal movement.   XI:  Normal shoulder shrug and head rotation.   XII:  Normal tongue strength and range of motion, no deviation or fasciculation.  MOTOR:  Generalized loss of muscle bulk throughout. No focal atrophy, fasciculations or abnormal movements.  No pronator drift.  Tone is normal.    Right Upper Extremity:    Left Upper Extremity:    Deltoid  5/5   Deltoid  5/5   Biceps  5/5   Biceps  5/5   Triceps  5/5   Triceps  5/5  Wrist extensors  5/5   Wrist extensors  5/5   Wrist flexors  5/5   Wrist flexors  5/5   Finger extensors  5/5   Finger extensors  5/5   Finger flexors  5/5   Finger flexors  5/5   Dorsal interossei  5/5   Dorsal interossei  5/5   Abductor pollicis  5/5   Abductor pollicis  5/5   Tone (Ashworth scale)  0  Tone (Ashworth scale)  0   Right Lower Extremity:    Left Lower Extremity:    Hip flexors * pain limited weakness and ROM 5-/5   Hip flexors  5/5   Hip extensors  5/5   Hip extensors  5/5   Adductor 5/5  Adductor 5/5  Abductor 5/5  Abductor 5/5  Knee flexors  5/5   Knee flexors  5/5   Knee extensors  5/5   Knee extensors  5/5   Dorsiflexors  5/5   Dorsiflexors  5/5   Plantarflexors  5/5   Plantarflexors  5/5   Toe extensors  5/5   Toe extensors  5/5   Toe flexors  5/5   Toe flexors  5/5   Tone (Ashworth scale)  0  Tone (Ashworth scale)  0   MSRs:  Right                                                                 Left brachioradialis 2+  brachioradialis 2+  biceps 2+  biceps 2+  triceps 2+  triceps 2+  patellar 2+  patellar 2+  ankle jerk 1+  ankle jerk 1+  Hoffman no  Hoffman no  plantar response down  plantar response down   SENSORY:  Vibration is reduced at the great toe bilaterally.  Temperature and pin prick is also reduced over the feet.  Sensation intact above the ankles.   COORDINATION/GAIT: Normal finger-to- nose-finger.  Intact rapid alternating movements bilaterally.  She is in a wheelchair and was able to stand up, but did appear to guard the right hip with slight buckling of the leg when standing and favors the left side.  I did not attempt to walk her due to severity of right hip pain.   IMPRESSION: Mrs. Scatena is a 84 year-old female referred for evaluation of right leg pain and weakness.  Based on her exam, she has pain-related weakness of the right hip flexors due to right hip pain.  She has reduced right hip ROM which reproduced pain within about 30 degrees  of hip flexion.  Her strength is limited by pain and not so much by neuromuscular weakness.  She also has tenderness which can be reproduced by palpation of the hip, which points more towards intrinsic pathology of the hip, moreso than lumbosacral radiculopathy.  MRI lumbar spine fro 2018 was personally viewed and shows degenerative changes with multilevel mild foraminal stenosis, but nothing which explains the severity of her leg pain.  Further, she denies radicular pain which seems to have been alleviated with ESI that she received in 2018.   I have asked her to talk to Dr. Quay Burow (PCP) or Dr. Tamala Julian (Sports Medicine) about getting dedicated imaging of the right hip.    She had mild distal sensory loss which  is most consistent with neuropathy.  She does not have risk factors and this is most likely idiopathic.  I discussed that NCS/EMG can be performed, but this would only confirm diagnosis, not change management.  At this time, her feet numbness is not bothersome and it was mutually decided not to proceed with any further testing for neuropathy.  She is functionally impaired by her right hip and knee pain and prefers to focus on this.  I am happy to reassess this, if symptoms get worse.  She had many questions which was answered to the best of my ability.  Greater than 50% of this 45 minute visit was spent in counseling, explanation of diagnosis, planning of further management, and coordination of care.    Thank you for allowing me to participate in patient's care.  If I can answer any additional questions, I would be pleased to do so.    Sincerely,    Camielle Sizer K. Posey Pronto, DO

## 2018-05-02 NOTE — Patient Instructions (Addendum)
Your joint pain seems to be causing your leg weakness.  I recommend that you talk to Dr. Quay Burow about having your right hip and knee evaluated

## 2018-05-03 ENCOUNTER — Ambulatory Visit (INDEPENDENT_AMBULATORY_CARE_PROVIDER_SITE_OTHER): Payer: PPO | Admitting: Internal Medicine

## 2018-05-03 ENCOUNTER — Encounter: Payer: Self-pay | Admitting: Internal Medicine

## 2018-05-03 VITALS — BP 122/70 | HR 99 | Ht 65.0 in | Wt 142.8 lb

## 2018-05-03 DIAGNOSIS — J449 Chronic obstructive pulmonary disease, unspecified: Secondary | ICD-10-CM | POA: Diagnosis not present

## 2018-05-03 DIAGNOSIS — J9611 Chronic respiratory failure with hypoxia: Secondary | ICD-10-CM | POA: Diagnosis not present

## 2018-05-03 DIAGNOSIS — Z23 Encounter for immunization: Secondary | ICD-10-CM | POA: Diagnosis not present

## 2018-05-03 DIAGNOSIS — C3491 Malignant neoplasm of unspecified part of right bronchus or lung: Secondary | ICD-10-CM

## 2018-05-03 DIAGNOSIS — J479 Bronchiectasis, uncomplicated: Secondary | ICD-10-CM | POA: Diagnosis not present

## 2018-05-03 NOTE — Progress Notes (Signed)
Patient ID: [name], female    DOB: 1932/10/15, 84 y.o.   MRN: [id number]  HPI female former smoker followed for history of right upper lobe resection/NSSCA, COPD, chronic hypoxic respiratory failure, allergic rhinitis, complicated by glaucoma Oximetry walk test 07/06/2015-desaturation to 85% while walking on room air. Saturation sustained at 97% on 2 L prongs  ----------------------------------------------------------------------------- .  08/21/17- 84 year old female former smoker followed for history of right upper lobe resection/NSSCA, COPD, chronic hypoxic respiratory failure, allergic rhinitis, complicated by glaucoma O2 3 L/[facility]  requalified today ----COPD; Recent ED visit. Pt continues to cough-productive-clear in color.  ED visit 06/29/17 for COPD exacerbation Neb Xopenex,Flovent hfa 110, albuterol hfa, pneumatic VEST too painful ([facility]) Here with husband and daughter.  Continues daily cough with deep rattle, frequently productive of white sputum.  No blood, purulent sputum, fever or acute infection.  Using nebulizer once or twice daily.  Thinks Flovent inhaler makes her cough but she is unclear about the names of her medicines. Tried pneumatic vest but even with refitting, it hurts old thoracotomy scars.  Doing better with a belt attached to the machine, instead of the vest but she is not sure it works as well. CXR 06/29/17- IMPRESSION: 1. Hyperinflation and stable right basilar changes. 2.  No evidence for acute pulmonary abnormality.  05/03/2018- 84 year old female former smoker followed for history of right upper lobe resection/NSSCA, COPD, chronic hypoxic respiratory failure, allergic rhinitis, complicated by glaucoma O2 3L/ [facility] -----pt c/o occ prod cough with clear mucus after using neb/inhalers and sob/wheezing with exertion. Neb Xopenex, Flovent HFA, Flutter device, Her nebulizer treatments definitely help when needed. Still recovering from a fall with  soft tissue trauma 2 months ago.  Mobility limited by arthritis. Her breathing has been stable with occasional productive cough/ white sputum, mostly after nebulizer treatments. Unable to wear her pneumatic vest after arm injury.  ROS-see HPI  "+" = positive Constitutional:    weight loss, night sweats, fevers, chills, fatigue, lassitude. HEENT:    headaches, difficulty swallowing, tooth/dental problems, sore throat,       sneezing, itching, ear ache, nasal congestion, post nasal drip, snoring CV:     chest pain, orthopnea, PND, swelling in lower extremities, anasarca,                                                        dizziness, palpitations Resp:   + shortness of breath with exertion or at rest.               + productive cough,   non-productive cough, coughing up of blood.              change in color of mucus.  wheezing.   Skin:    rash or lesions. GI:  No-   heartburn, indigestion, abdominal pain, nausea, vomiting,  GU:  MS:   + joint pain, stiffness, . Neuro-     nothing unusual Psych:  change in mood or affect.  depression or anxiety.   memory loss.   Objective:   OBJ- Physical Exam General- Alert, Oriented, Affect-appropriate, Distress- none acute, + Wearing O2 3 L pulse + wheelchair Skin- rash-none, lesions- none, excoriation- none Lymphadenopathy- none Head- atraumatic            Eyes- Gross vision intact, PERRLA, conjunctivae and  secretions clear            Ears- Hearing, canals-normal            Nose- Clear, no-Septal dev, mucus, polyps, erosion, perforation             Throat- Mallampati II , mucosa clear , drainage- none, tonsils- atrophic Neck- flexible , trachea midline, no stridor , thyroid nl, carotid no bruit Chest - symmetrical excursion , unlabored           Heart/CV- RRR , no murmur , no gallop  , no rub, nl s1 s2                           - JVD- none , edema- none, stasis changes- none, varices- none           Lung-  +  , dullness-none, rub- none,   wheeze-+            Chest wall- + remote right thoracotomy for right  lobectomy Abd-  Br/ Gen/ Rectal- Not done, not indicated Extrem-+ dressing on left forearm Neuro- grossly intact to observation

## 2018-05-03 NOTE — Patient Instructions (Signed)
Order- DC [facility] pneumatic Vest  Order- Flu vax senior  We will continue O2 3L/ [facility]  Please call if we can help

## 2018-05-07 NOTE — Progress Notes (Signed)
Subjective:    Patient ID: [name], female    DOB: 1933-03-18, 83 y.o.   MRN: [id number]  HPI The patient is here for follow up.  Right leg weakness/pain:  She saw Dr Posey Pronto for this recently.  Dr Posey Pronto noted pain related weakness with hip flexion on the right triggered by any movement of the righ hip.  She did not feel she had lumbar radiculopathy.  She stated she may need a MRI of the right hip - possible pathology in the joint space.  She has seen sports medicine for her knee and has had injections that has not helped.  She is concerned knee will give out on her and she does not feel her balance as well.  She does use a walker.  The knee is very painful.  Skin tear left arm:  She is seeing the wound clinic today.  It is healing slowly.  The home health nurse was coming out to change the bandages, but her and her husband have done some of the bandages.  Recurrent falls:  She is anxious about falling.  She has not had any falls since she was here last.  She uses her walker.  She is very careful.  Anxiety: She is taking her medication daily as prescribed. She denies any side effects from the medication. She feels her anxiety is well controlled and she is happy with her current dose of medication.   Hypothyroidism:  She is taking her medication daily.  She denies any recent changes in energy or weight that are unexplained.   Hyperlipidemia: She is taking her medication daily. She is compliant with a low fat/cholesterol diet. She is exercising regularly. She denies myalgias.   OA:  She takes tylenol.    She has intermittent dysuria.  This is not new and it has been intermittent for a while.  She sometimes feels she does not go as much as she should.  Medications and allergies reviewed with patient and updated if appropriate.  Patient Active Problem List   Diagnosis Date Noted  . Fall 02/28/2018  . Skin tear of left forearm without complication 56/38/7564  . Degenerative disc  disease, cervical 12/06/2017  . DOE (dyspnea on exertion) 11/21/2017  . Fatigue 11/02/2017  . Bilateral swelling of feet 11/02/2017  . Right-sided chest pain 06/02/2017  . Osteopenia 02/16/2017  . Hyperglycemia 12/08/2016  . Degenerative arthritis of right knee 10/27/2016  . 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 Greater trochanteric bursitis of both hips 04/08/2016  . Pain in both knees 04/08/2016  . Chronic midline low back pain without sciatica 04/08/2016  . Dysuria 12/02/2015  . Hip pain 12/02/2015  . Chronic respiratory failure with hypoxia ([place]) 03/07/2015  . Diverticulitis 05/24/2014  . Anxiety   . Bladder prolapse, female, acquired   . Arthritis   . Non-small cell carcinoma of lung, stage 1 ([place]) 01/10/2012  . Neuropathy, peripheral 01/28/2011  . COPD mixed type ([place]) 09/08/2010  . Dyslipidemia   . ATONY OF BLADDER 08/10/2009  . Hypothyroidism 03/12/2008  . Seasonal allergic rhinitis 10/10/2007    Current Outpatient Medications on File Prior to Visit  Medication Sig Dispense Refill  . acetaminophen (TYLENOL ARTHRITIS PAIN) 650 MG CR tablet Take 650 mg by mouth daily as needed for pain.     [name] ALPRAZolam (XANAX) 1 MG tablet Take 1 tablet (1 mg total) by mouth at bedtime. -- Office visit needed for further refills 30 tablet  1  . atorvastatin (LIPITOR) 20 MG tablet TAKE 1 TABLET (20 MG TOTAL) BY MOUTH DAILY. (Patient taking differently: Take 20 mg by mouth every other day. ) 90 tablet 1  . Cholecalciferol (VITAMIN D) 2000 UNITS tablet Take 1 tablet (2,000 Units total) by mouth daily. 30 tablet 11  . FLOVENT HFA 110 MCG/ACT inhaler TAKE 2 PUFFS BY MOUTH TWICE A DAY 12 Inhaler 5  . levalbuterol (XOPENEX) 1.25 MG/3ML nebulizer solution USE 1 NEBULE EVERY 8 HOURS AS NEEDED FOR WHEEZING (Patient taking differently: USE 1 NEBULE ONCE A DAY AS NEED AS NEEDED FOR WHEEZING) 360 mL 2  . levothyroxine (SYNTHROID, LEVOTHROID) 88 MCG tablet TAKE 1 TABLET  BY MOUTH EVERY DAY 90 tablet 1  . Multiple Vitamins-Minerals (CENTRUM SILVER PO) Take 1 tablet by mouth daily.     . ondansetron (ZOFRAN) 4 MG tablet Take 1 tablet (4 mg total) by mouth every 8 (eight) hours as needed for nausea or vomiting. 20 tablet 0  . Probiotic Product (PROBIOTIC PO) Take 1 capsule by mouth daily.    [name] Respiratory Therapy Supplies (FLUTTER) DEVI Blow through 4 times per cycle and repeat 3 cycles per day 1 each 0   No current facility-administered medications on file prior to visit.     Past Medical History:  Diagnosis Date  . Allergic rhinitis   . Anxiety   . Arthritis    "back, hands; hips" (12/22/2014)  . Asthma   . Bladder atony   . Chronic bronchitis ([place])   . COPD (chronic obstructive pulmonary disease) ([place])   . Diarrhea   . Emphysema of lung ([place])   . GERD (gastroesophageal reflux disease)    "w/spicey foods" (12/22/2014)  . Hyperlipidemia   . Non-small cell carcinoma of lung, stage 1 ([place]) 2005   1.4 cm Poorly differentiated Squamous cell RUL  T1N0 resected 09/22/2003  . On home oxygen therapy    "3L q night and prn during the day" (12/22/2014)  . Pelvic cyst   . Pneumonia    "this is the 3rd time that I can remember" (12/22/2014)  . Unspecified hypothyroidism     Past Surgical History:  Procedure Laterality Date  . ABDOMINAL HYSTERECTOMY  ~ 1976  . BACK SURGERY    . CATARACT EXTRACTION W/ INTRAOCULAR LENS  IMPLANT, BILATERAL Bilateral ~ 2012  . DILATION AND CURETTAGE OF UTERUS    . LUMBAR DISC SURGERY  1970's  . LYMPH NODE DISSECTION  2005   "all my lymph nodes under breasts, went thru my back; Dr. Arlyce Dice did it"  . Needle aspiration Pelvic cyst  2011   Dr Diona Fanti  . VIDEO ASSISTED THORACOSCOPY (VATS)/THOROCOTOMY Right 09/2003   thoracotomy, right upper lobectomy with node dissection; Dr Demetrios Loll 11/02/2010  . VIDEO BRONCHOSCOPY  03/2004   Archie Endo 11/02/2010    Social History   Socioeconomic History  . Marital status: Married    Spouse  name: Not on file  . Number of children: 2  . Years of education: 83  . Highest education level: High school graduate  Occupational History  . Occupation: Retired Animator, Psychiatrist: RETIRED  Social Needs  . Financial resource strain: Not on file  . Food insecurity:    Worry: Not on file    Inability: Not on file  . Transportation needs:    Medical: Not on file    Non-medical: Not on file  Tobacco Use  . Smoking status: Former Smoker    Packs/day:  1.00    Years: 42.00    Pack years: 42.00    Types: Cigarettes    Last attempt to quit: 09/22/2003    Years since quitting: 14.6  . Smokeless tobacco: Never Used  Substance and Sexual Activity  . Alcohol use: Yes    Alcohol/week: 14.0 standard drinks    Types: 14 Glasses of wine per week    Comment: 12/22/2014 "I have a large glass of wine qd"  . Drug use: No  . Sexual activity: Never  Lifestyle  . Physical activity:    Days per week: Not on file    Minutes per session: Not on file  . Stress: Not on file  Relationships  . Social connections:    Talks on phone: Not on file    Gets together: Not on file    Attends religious service: Not on file    Active member of club or organization: Not on file    Attends meetings of clubs or organizations: Not on file    Relationship status: Not on file  Other Topics Concern  . Not on file  Social History Narrative   Lives with husband in a one story home.  Has 2 children.  Retired.  Education: high school.     Family History  Problem Relation Age of Onset  . Heart disease Father   . Rheumatologic disease Brother   . Cancer Brother        Leukemia  . Rheumatologic disease Sister   . Breast cancer Daughter     Review of Systems  Constitutional: Negative for chills and fever.  Respiratory: Positive for cough (intermittent) and shortness of breath. Negative for wheezing.   Cardiovascular: Positive for palpitations (wtih exertion). Negative for chest pain and  leg swelling.  Musculoskeletal: Positive for arthralgias.  Skin: Positive for wound (left forearm).  Neurological: Negative for light-headedness and headaches.  Psychiatric/Behavioral: The patient is nervous/anxious.        Objective:   Vitals:   05/08/18 1334  BP: 128/64  Pulse: (!) 102  Resp: 16  Temp: 98 F (36.7 C)  SpO2: 98%   BP Readings from Last 3 Encounters:  05/08/18 128/64  05/03/18 122/70  05/02/18 120/68   Wt Readings from Last 3 Encounters:  05/08/18 142 lb (64.4 kg)  05/03/18 142 lb 12.8 oz (64.8 kg)  05/02/18 142 lb 8 oz (64.6 kg)   Body mass index is 23.63 kg/m.   Physical Exam    Constitutional: Appears well-developed and well-nourished. No distress.  HENT:  Head: Normocephalic and atraumatic.  Neck: Neck supple. No tracheal deviation present. No thyromegaly present.  No cervical lymphadenopathy Cardiovascular: Normal rate, regular rhythm and normal heart sounds.   No murmur heard. No carotid bruit .  No edema Pulmonary/Chest: Effort normal and breath sounds normal. No respiratory distress. No has no wheezes. No rales.  Skin: Skin is warm and dry. Not diaphoretic. Left forearm wrapped Psychiatric: Normal mood and affect. Behavior is normal.      Assessment & Plan:    See Problem List for Assessment and Plan of chronic medical problems.

## 2018-05-08 ENCOUNTER — Encounter: Payer: Self-pay | Admitting: Internal Medicine

## 2018-05-08 ENCOUNTER — Ambulatory Visit (INDEPENDENT_AMBULATORY_CARE_PROVIDER_SITE_OTHER): Payer: PPO | Admitting: Internal Medicine

## 2018-05-08 ENCOUNTER — Other Ambulatory Visit (INDEPENDENT_AMBULATORY_CARE_PROVIDER_SITE_OTHER): Payer: PPO

## 2018-05-08 VITALS — BP 128/64 | HR 102 | Temp 98.0°F | Resp 16 | Ht 65.0 in | Wt 142.0 lb

## 2018-05-08 DIAGNOSIS — R300 Dysuria: Secondary | ICD-10-CM

## 2018-05-08 DIAGNOSIS — M25551 Pain in right hip: Secondary | ICD-10-CM | POA: Diagnosis not present

## 2018-05-08 DIAGNOSIS — S51812D Laceration without foreign body of left forearm, subsequent encounter: Secondary | ICD-10-CM | POA: Diagnosis not present

## 2018-05-08 DIAGNOSIS — E785 Hyperlipidemia, unspecified: Secondary | ICD-10-CM

## 2018-05-08 DIAGNOSIS — F419 Anxiety disorder, unspecified: Secondary | ICD-10-CM

## 2018-05-08 DIAGNOSIS — S51812A Laceration without foreign body of left forearm, initial encounter: Secondary | ICD-10-CM | POA: Diagnosis not present

## 2018-05-08 DIAGNOSIS — E038 Other specified hypothyroidism: Secondary | ICD-10-CM

## 2018-05-08 DIAGNOSIS — M25561 Pain in right knee: Secondary | ICD-10-CM | POA: Insufficient documentation

## 2018-05-08 LAB — LIPID PANEL
CHOL/HDL RATIO: 2
Cholesterol: 150 mg/dL (ref 0–200)
HDL: 86.9 mg/dL (ref >39.00)
LDL CALC: 47 mg/dL (ref 0–99)
NONHDL: 62.77
Triglycerides: 80 mg/dL (ref 0.0–149.0)
VLDL: 16 mg/dL (ref 0.0–40.0)

## 2018-05-08 LAB — CBC WITH DIFFERENTIAL/PLATELET
Basophils Absolute: 0 10*3/uL (ref 0.0–0.1)
Basophils Relative: 0.4 % (ref 0.0–3.0)
EOS PCT: 0.1 % (ref 0.0–5.0)
Eosinophils Absolute: 0 10*3/uL (ref 0.0–0.7)
HCT: 35.3 % — ABNORMAL LOW (ref 36.0–46.0)
Hemoglobin: 11.9 g/dL — ABNORMAL LOW (ref 12.0–15.0)
LYMPHS ABS: 1.7 10*3/uL (ref 0.7–4.0)
Lymphocytes Relative: 24.3 % (ref 12.0–46.0)
MCHC: 33.6 g/dL (ref 30.0–36.0)
MCV: 99.6 fl (ref 78.0–100.0)
MONOS PCT: 9.4 % (ref 3.0–12.0)
Monocytes Absolute: 0.7 10*3/uL (ref 0.1–1.0)
NEUTROS PCT: 65.8 % (ref 43.0–77.0)
Neutro Abs: 4.5 10*3/uL (ref 1.4–7.7)
Platelets: 228 10*3/uL (ref 150.0–400.0)
RBC: 3.55 Mil/uL — AB (ref 3.87–5.11)
RDW: 12.6 % (ref 11.5–15.5)
WBC: 6.9 10*3/uL (ref 4.0–10.5)

## 2018-05-08 LAB — COMPREHENSIVE METABOLIC PANEL
ALK PHOS: 141 U/L — AB (ref 39–117)
ALT: 17 U/L (ref 0–35)
AST: 34 U/L (ref 0–37)
Albumin: 3.4 g/dL — ABNORMAL LOW (ref 3.5–5.2)
BUN: 6 mg/dL (ref 6–23)
CHLORIDE: 94 meq/L — AB (ref 96–112)
CO2: 32 mEq/L (ref 19–32)
Calcium: 9.9 mg/dL (ref 8.4–10.5)
Creatinine, Ser: 0.76 mg/dL (ref 0.40–1.20)
GFR: 77.23 mL/min (ref >60.00)
GLUCOSE: 108 mg/dL — AB (ref 70–99)
POTASSIUM: 4.1 meq/L (ref 3.5–5.1)
Sodium: 134 mEq/L — ABNORMAL LOW (ref 135–145)
Total Bilirubin: 0.6 mg/dL (ref 0.2–1.2)
Total Protein: 6.9 g/dL (ref 6.0–8.3)

## 2018-05-08 LAB — TSH: TSH: 4.08 u[IU]/mL (ref 0.35–4.50)

## 2018-05-08 NOTE — Patient Instructions (Signed)
  Tests ordered today. Your results will be released to [place] (or called to you) after review, usually within 72hours after test completion. If any changes need to be made, you will be notified at that same time.   Medications reviewed and updated.  Changes include :   none   A referral was ordered for orthopedics  Please followup in 6 months

## 2018-05-08 NOTE — Assessment & Plan Note (Signed)
Clinically euthyroid Check tsh  Titrate med dose if needed  

## 2018-05-08 NOTE — Assessment & Plan Note (Signed)
?   Chronic Not drinking much water during the day - advised her to increase her water UA, UCx

## 2018-05-08 NOTE — Assessment & Plan Note (Signed)
following at the wound center Healing slowly

## 2018-05-08 NOTE — Assessment & Plan Note (Signed)
Takes xanax at night No side effects Will continue

## 2018-05-08 NOTE — Assessment & Plan Note (Signed)
Persistent pain and some weakness Has seen neuro - not neurological Has seen sports med - injections have not helped Will refer to ortho

## 2018-05-08 NOTE — Assessment & Plan Note (Addendum)
Check lipid panel  Continue daily statin  healthy diet encouraged

## 2018-05-08 NOTE — Assessment & Plan Note (Signed)
Right hip pain Will refer to ortho

## 2018-05-09 ENCOUNTER — Telehealth: Payer: Self-pay | Admitting: Internal Medicine

## 2018-05-09 LAB — URINALYSIS, ROUTINE W REFLEX MICROSCOPIC
BILIRUBIN URINE: NEGATIVE
HGB URINE DIPSTICK: NEGATIVE
KETONES UR: NEGATIVE
Leukocytes, UA: NEGATIVE
NITRITE: NEGATIVE
RBC / HPF: NONE SEEN (ref 0–?)
Specific Gravity, Urine: <=1.005 — AB (ref 1.000–1.030)
Total Protein, Urine: NEGATIVE
URINE GLUCOSE: NEGATIVE
UROBILINOGEN UA: 0.2 (ref 0.0–1.0)
WBC UA: NONE SEEN (ref 0–?)
pH: 7 (ref 5.0–8.0)

## 2018-05-09 LAB — URINE CULTURE
MICRO NUMBER: 91392541
SPECIMEN QUALITY:: ADEQUATE

## 2018-05-09 NOTE — Telephone Encounter (Signed)
Patient has called back in reference to her labs.  I have given her those results along with her cholesterol level.

## 2018-05-09 NOTE — Telephone Encounter (Signed)
NOTED

## 2018-05-16 ENCOUNTER — Other Ambulatory Visit: Payer: Self-pay | Admitting: Internal Medicine

## 2018-05-16 MED ORDER — ALPRAZOLAM 1 MG PO TABS: 1.0000 mg | ORAL_TABLET | Freq: Every day | ORAL | 1 refills | Status: DC

## 2018-05-16 NOTE — Telephone Encounter (Signed)
Copied from [place] [id number]. Topic: Quick Communication - Rx Refill/Question >> May 16, 2018 10:36 AM Leward Quan A wrote: Medication: ALPRAZolam Duanne Moron) 1 MG tablet   Has the patient contacted their pharmacy? Yes.   (Agent: If no, request that the patient contact the pharmacy for the refill.) (Agent: If yes, when and what did the pharmacy advise?)  Preferred Pharmacy (with phone number or street name): [id number] [address], [place]. [phone] (Phone) [phone] (Fax)    Agent: Please be advised that RX refills may take up to 3 business days. We ask that you follow-up with your pharmacy.

## 2018-05-16 NOTE — Telephone Encounter (Signed)
Last refill was 04/16/18 Last OV was 05/08/18 Next OV is 11/20/18

## 2018-05-17 DIAGNOSIS — J9611 Chronic respiratory failure with hypoxia: Secondary | ICD-10-CM | POA: Diagnosis not present

## 2018-05-17 DIAGNOSIS — J449 Chronic obstructive pulmonary disease, unspecified: Secondary | ICD-10-CM | POA: Diagnosis not present

## 2018-05-24 ENCOUNTER — Telehealth: Payer: Self-pay | Admitting: Internal Medicine

## 2018-05-24 MED ORDER — NEBULIZER/TUBING/MOUTHPIECE KIT: PACK | 12 refills | Status: DC

## 2018-05-24 NOTE — Telephone Encounter (Signed)
Called and spoke with Patient.  Patient stated she got her neb machine at [facility] and [facility]. Patient DME is [facility].  She stated that they will not give her new neb, handheld device, without buying a new neb machine.  Patient stated that [facility] told her that she would have to get nebs from them. Katie spoke with [employer].  They stated that they will give her supplies with a new DME order. Detailed massage left for Patient.  New neb tubing prescription sent to [facility].  Nothing further.

## 2018-05-25 DIAGNOSIS — J449 Chronic obstructive pulmonary disease, unspecified: Secondary | ICD-10-CM | POA: Diagnosis not present

## 2018-05-28 ENCOUNTER — Ambulatory Visit (INDEPENDENT_AMBULATORY_CARE_PROVIDER_SITE_OTHER): Payer: PPO | Admitting: Orthopaedic Surgery

## 2018-05-28 ENCOUNTER — Encounter (INDEPENDENT_AMBULATORY_CARE_PROVIDER_SITE_OTHER): Payer: Self-pay | Admitting: Orthopaedic Surgery

## 2018-05-28 ENCOUNTER — Ambulatory Visit (INDEPENDENT_AMBULATORY_CARE_PROVIDER_SITE_OTHER): Payer: PPO

## 2018-05-28 VITALS — BP 118/59 | HR 101 | Ht 66.0 in | Wt 142.0 lb

## 2018-05-28 DIAGNOSIS — M545 Low back pain, unspecified: Secondary | ICD-10-CM

## 2018-05-28 DIAGNOSIS — M25551 Pain in right hip: Secondary | ICD-10-CM

## 2018-05-28 DIAGNOSIS — G8929 Other chronic pain: Secondary | ICD-10-CM

## 2018-05-28 NOTE — Progress Notes (Signed)
Office Visit Note   Patient: [name]           Date of Birth: Mar 11, 1933           MRN: [id number] Visit Date: 05/28/2018              Requested by: Binnie Rail, MD [place], [place] [address] PCP: Binnie Rail, MD   Assessment & Plan: Visit Diagnoses:  1. Chronic right-sided low back pain, unspecified whether sciatica present   2. Pain in right hip     Plan: [name] has a potential combination of factors creating her pain.  The predominant problem I believe is the arthritis and avascular necrosis of her right hip.  Long discussion 45 minutes 50% of the time in counseling regarding treatment options.  She is oxygen dependent with multiple comorbidities and probably is not a candidate for surgery.  We discussed trying to mitigate her pain with medicines.  We will try an intra-articular cortisone injection and have her return in 1 month.  Might want to consider further diagnostic studies of the lumbar spine  Follow-Up Instructions: Return in about 1 month (around 06/28/2018).   Orders:  Orders Placed This Encounter  Procedures  . XR Lumbar Spine 2-3 Views  . XR Pelvis 1-2 Views  . DL FLUORO GUIDED NEEDLE PLC ASPIRATION / INJECTTION/LOC   No orders of the defined types were placed in this encounter.     Procedures: No procedures performed   Clinical Data: No additional findings.   Subjective: Chief Complaint  Patient presents with  . Right Hip - Pain  . Right Knee - Pain  . Hip Pain    Pain starts in right lower back with radiating lateral leg pain with numbness. Unable to lift leg, when lifting with hands will have pain radiate to right lower back. No groin pain. increased symptoms since before september when patient had a fall.  . Knee Pain    Right knee sharp pain medially and laterally. limited ROM. Uses walker or wheelchair, in wheelchair today. Takes gabapentin.  [name] is 84 years old accompanied by her daughter and here for  evaluation of painful right lower extremity.  Her pain originates apparently in the lumbar lower lumbar spine radiating to the right groin, lateral hip, right thigh right knee and distally.  She has been evaluated by her primary care physician in the past for the arthritis in her knee and has had cortisone with some varying degrees of relief.  She is also had a cortisone injection over the lateral aspect of her hip that provided some temporary relief.  She does walk with a walker with considerable pain.  Majority of her pain seems to be in the area of her right groin.  I have reviewed her prior primary care office notes with evidence of a greater trochanteric bursitis right hip and arthritis of her knee.  Her primary care physician notes that she has chronic pain "everywhere.  He has tried gabapentin at night for sleep  HPI  Review of Systems   Objective: Vital Signs: BP (!) 118/59 (BP Location: Right Arm, Patient Position: Sitting, Cuff Size: Normal)   Pulse (!) 101   Ht 5\' 6"  (1.676 m)   Wt 142 lb (64.4 kg)   BMI 22.92 kg/m   Physical Exam  Constitutional: She is oriented to person, place, and time. She appears well-developed and well-nourished.  HENT:  Mouth/Throat: Oropharynx is clear and moist.  Eyes: Pupils are equal, round, and reactive to light. EOM are normal.  Neurological: She is alert and oriented to person, place, and time.  Skin: Skin is warm and dry.  Psychiatric: She has a normal mood and affect. Her behavior is normal.    Ortho Exam awake alert and oriented x3.  Comfortable sitting in the wheelchair.  Has supplemental nasal oxygen.  Was not short of breath nor did she complain of chest pain.  Considerable pain with any motion of her right hip with discomfort in the groin.  Had multiple areas of discomfort to touch about her right hip right thigh and right knee.  No distal edema.  Straight leg raise was negative.  Does have a multiple areas of percussible tenderness and  finger point tenderness of the lumbar spine  Specialty Comments:  No specialty comments available.  Imaging: Xr Lumbar Spine 2-3 Views  Result Date: 05/28/2018 Films of the lumbar spine were obtained in 2 projections.  There is a degenerative scoliosis to the right.  There is also complete straightening of the normal lordotic curve with loss of the disc space heights at virtually every level of the lumbar spine.  No evidence of a listhesis.  Facet sclerosis at L3-4, L4-5 and L5-S1.  Anterior osteophytes are significant.  There is diffuse calcification of the abdominal aorta without obvious aneurysmal dilatation  Xr Pelvis 1-2 Views  Result Date: 05/28/2018 AP the pelvis demonstrates significant arthritis of the right hip probably associated with avascular necrosis.  The joint space is nearly obliterated.  There is some flattening of the head    PMFS History: Patient Active Problem List   Diagnosis Date Noted  . Right knee pain 05/08/2018  . Fall 02/28/2018  . Skin tear of left forearm without complication 16/03/9603  . Degenerative disc disease, cervical 12/06/2017  . DOE (dyspnea on exertion) 11/21/2017  . Fatigue 11/02/2017  . Bilateral swelling of feet 11/02/2017  . Right-sided chest pain 06/02/2017  . Osteopenia 02/16/2017  . Hyperglycemia 12/08/2016  . Degenerative arthritis of right knee 10/27/2016  . 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 Greater trochanteric bursitis of both hips 04/08/2016  . Pain in both knees 04/08/2016  . Chronic midline low back pain without sciatica 04/08/2016  . Dysuria 12/02/2015  . Hip pain 12/02/2015  . Chronic respiratory failure with hypoxia ([place]) 03/07/2015  . Diverticulitis 05/24/2014  . Anxiety   . Bladder prolapse, female, acquired   . Arthritis   . Non-small cell carcinoma of lung, stage 1 ([place]) 01/10/2012  . Neuropathy, peripheral 01/28/2011  . COPD mixed type ([place]) 09/08/2010  . Dyslipidemia     . ATONY OF BLADDER 08/10/2009  . Hypothyroidism 03/12/2008  . Seasonal allergic rhinitis 10/10/2007   Past Medical History:  Diagnosis Date  . Allergic rhinitis   . Anxiety   . Arthritis    "back, hands; hips" (12/22/2014)  . Asthma   . Bladder atony   . Chronic bronchitis ([place])   . COPD (chronic obstructive pulmonary disease) ([place])   . Diarrhea   . Emphysema of lung ([place])   . GERD (gastroesophageal reflux disease)    "w/spicey foods" (12/22/2014)  . Hyperlipidemia   . Non-small cell carcinoma of lung, stage 1 ([place]) 2005   1.4 cm Poorly differentiated Squamous cell RUL  T1N0 resected 09/22/2003  . On home oxygen therapy    "3L q night and prn during the day" (12/22/2014)  . Pelvic cyst   .  Pneumonia    "this is the 3rd time that I can remember" (12/22/2014)  . Unspecified hypothyroidism     Family History  Problem Relation Age of Onset  . Heart disease Father   . Rheumatologic disease Brother   . Cancer Brother        Leukemia  . Rheumatologic disease Sister   . Breast cancer Daughter     Past Surgical History:  Procedure Laterality Date  . ABDOMINAL HYSTERECTOMY  ~ 1976  . BACK SURGERY    . CATARACT EXTRACTION W/ INTRAOCULAR LENS  IMPLANT, BILATERAL Bilateral ~ 2012  . DILATION AND CURETTAGE OF UTERUS    . LUMBAR DISC SURGERY  1970's  . LYMPH NODE DISSECTION  2005   "all my lymph nodes under breasts, went thru my back; Dr. Arlyce Dice did it"  . Needle aspiration Pelvic cyst  2011   Dr Diona Fanti  . VIDEO ASSISTED THORACOSCOPY (VATS)/THOROCOTOMY Right 09/2003   thoracotomy, right upper lobectomy with node dissection; Dr Demetrios Loll 11/02/2010  . VIDEO BRONCHOSCOPY  03/2004   Archie Endo 11/02/2010   Social History   Occupational History  . Occupation: Retired Animator, School system    Employer: RETIRED  Tobacco Use  . Smoking status: Former Smoker    Packs/day: 1.00    Years: 42.00    Pack years: 42.00    Types: Cigarettes    Last attempt to quit: 09/22/2003     Years since quitting: 14.6  . Smokeless tobacco: Never Used  Substance and Sexual Activity  . Alcohol use: Yes    Alcohol/week: 14.0 standard drinks    Types: 14 Glasses of wine per week    Comment: 12/22/2014 "I have a large glass of wine qd"  . Drug use: No  . Sexual activity: Never     Garald Balding, MD   Note - This record has been created using [employer].  Chart creation errors have been sought, but may not always  have been located. Such creation errors do not reflect on  the standard of medical care.

## 2018-05-29 ENCOUNTER — Telehealth: Payer: Self-pay | Admitting: Internal Medicine

## 2018-05-29 DIAGNOSIS — J9611 Chronic respiratory failure with hypoxia: Secondary | ICD-10-CM

## 2018-05-29 DIAGNOSIS — J479 Bronchiectasis, uncomplicated: Secondary | ICD-10-CM

## 2018-05-29 IMAGING — DX DG LUMBAR SPINE COMPLETE 4+V
5 series · 5 of 5 positions shown · non-contrast
Comparison: CT Abdomen and Pelvis 05/23/2014. Lumbar MRI
01/24/2012.

CLINICAL DATA: 83-year-old female with chronic bilateral hip pain.
No known injury.

EXAM:
LUMBAR SPINE - COMPLETE 4+ VIEW

[l-spine ap]
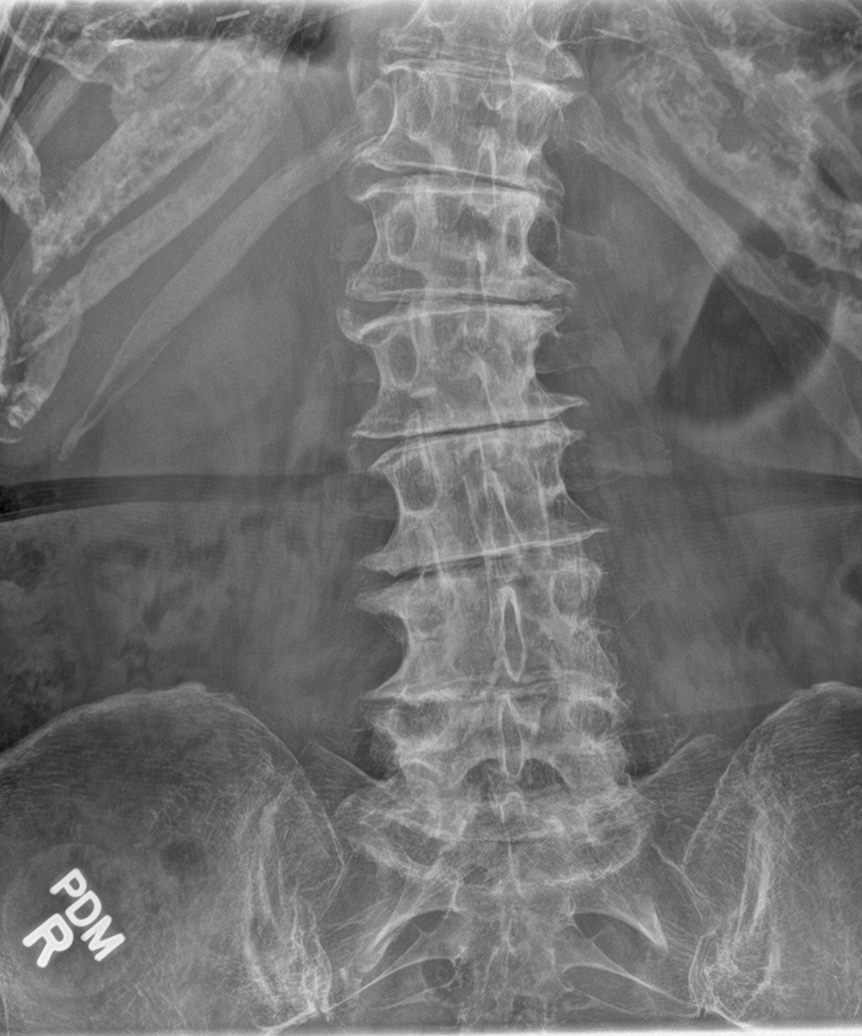

[l-spine obl (1 of 2)]
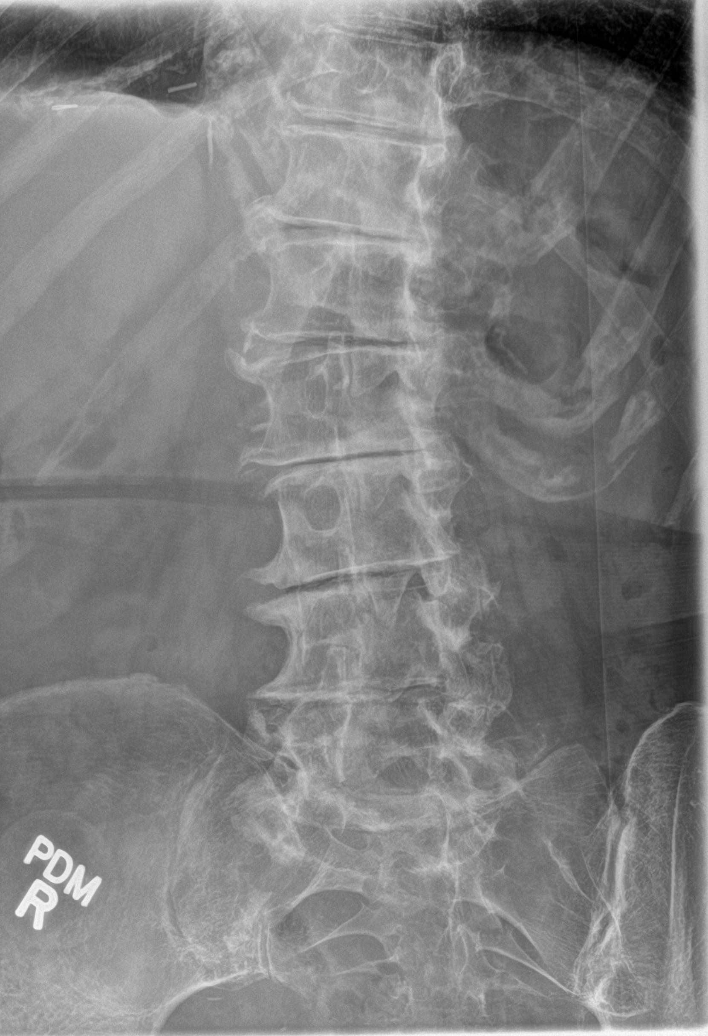

[l-spine obl (2 of 2)]
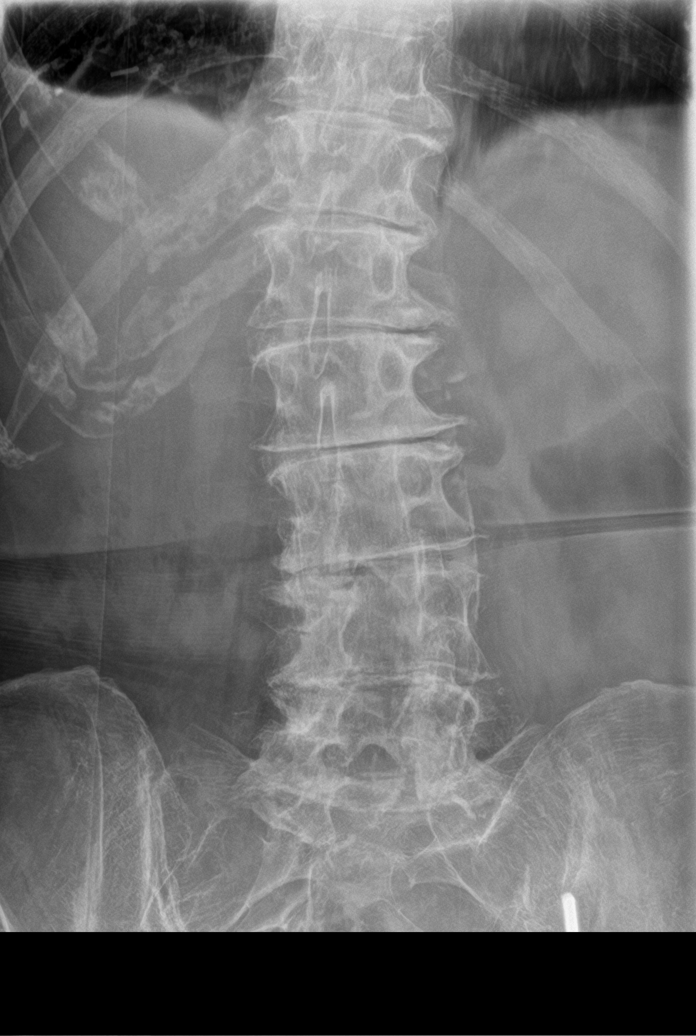

[l-spine lat]
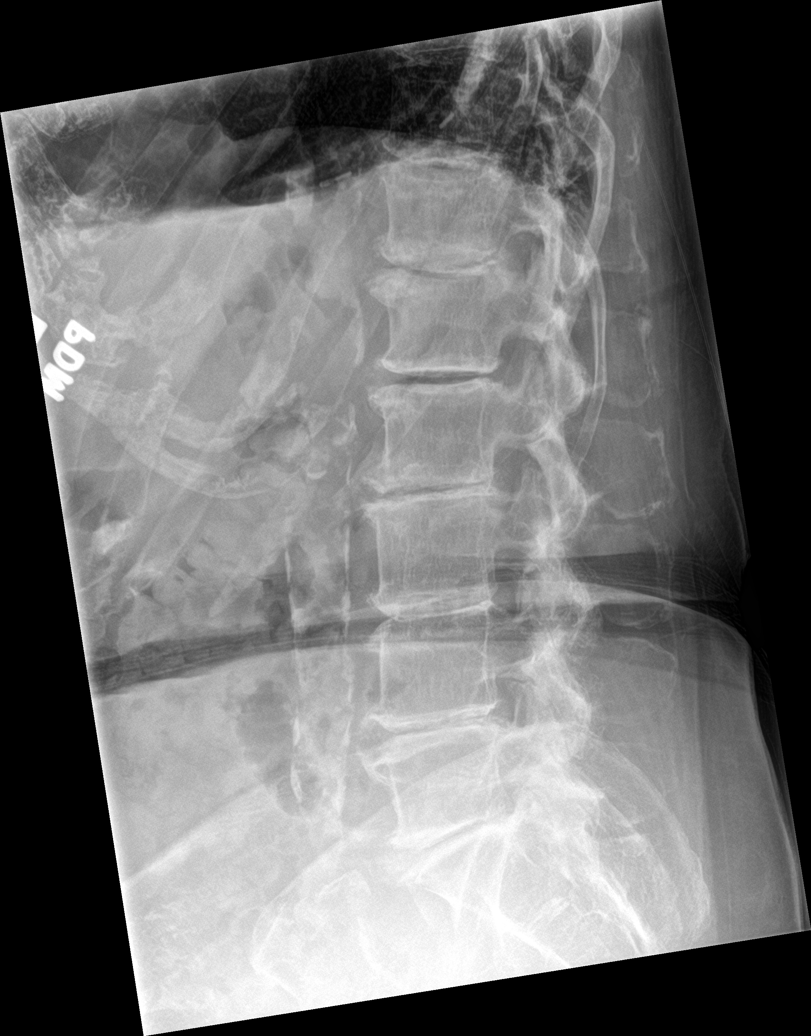

[l-spine spot]
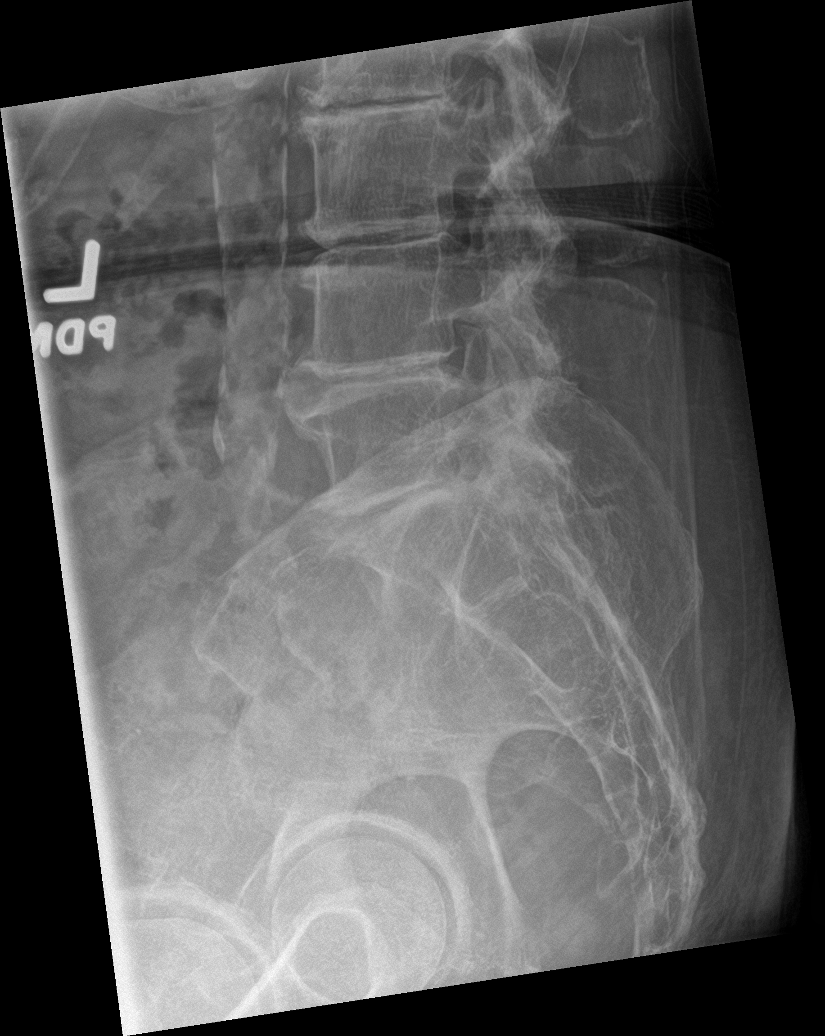

[5 of 5 positions shown; findings below may reference images not displayed]

FINDINGS: Normal lumbar segmentation. Stable lumbar vertebral height and
alignment since 1244, with mild upper lumbar dextroconvex curvature
and straightening of lumbar lordosis. Diffuse lumbar disc space loss
and vacuum disc. Diffuse lumbar endplate spurring and sclerosis.
Moderate mid and lower lumbar facet hypertrophy. Sacral ala and SI
joints appear intact. Visible lower thoracic levels appear intact.
Extensive Calcified aortic atherosclerosis.
IMPRESSION: 1.  No acute osseous abnormality in lumbar spine.
2. Diffuse chronic lumbar disc and endplate degeneration. Widespread
moderate lumbar facet degeneration.
3.  Calcified aortic atherosclerosis.

## 2018-05-29 MED ORDER — LEVALBUTEROL HCL 1.25 MG/3ML IN NEBU: INHALATION_SOLUTION | RESPIRATORY_TRACT | 2 refills | Status: DC

## 2018-05-29 MED ORDER — LEVALBUTEROL HCL 1.25 MG/3ML IN NEBU: 1.2500 mg | INHALATION_SOLUTION | Freq: Four times a day (QID) | RESPIRATORY_TRACT | 3 refills | Status: DC | PRN

## 2018-05-29 NOTE — Telephone Encounter (Signed)
Ok Please order through [employer] nebulizer      Dx bronchiectasis  Rx Xopenex 1.25 mg, # 360 ml,   3 ml every 6 hours if needed     Ref x 3

## 2018-05-29 NOTE — Telephone Encounter (Signed)
These have been sent in. Nothing further needed.

## 2018-05-29 NOTE — Telephone Encounter (Signed)
Called and spoke with [facility], they stated this will have to go to the patients local pharmacy because they do not carry it. Sent in to local pharmacy patient aware. Nothing further needed.

## 2018-05-29 NOTE — Telephone Encounter (Signed)
Called and spoke with patient, she stated that she is now with [facility] and no longer with [facility], her machine is now 82 years old and she needs a new one to be sent to [facility] with the medication.   CY please advise, thank you.

## 2018-05-30 ENCOUNTER — Telehealth: Payer: Self-pay | Admitting: Internal Medicine

## 2018-05-30 NOTE — Assessment & Plan Note (Signed)
She remains dependent on oxygen at 3 L.  Setting is appropriate.

## 2018-05-30 NOTE — Assessment & Plan Note (Signed)
She is probably near her baseline despite her problems over the past several months.  Vest is not helping so we will discontinue that for now. Plan-continue present meds.  Flu vaccine.

## 2018-05-30 NOTE — Telephone Encounter (Signed)
Sorry. Avoiding anticholinergics due to her glaucoma

## 2018-05-30 NOTE — Telephone Encounter (Signed)
Pt states she was called by [facility] stating they do not carry her Xopenex nebulizer solution but they could offer her Yupelri neb solution at no cost since her insurance would cover it in full. Pt would like to know if this is an option for her and if so then please send in Rx to [place].  Pt is getting her new nebulizer machine tomorrow. Will forward to CY to advise. Thanks.

## 2018-05-30 NOTE — Telephone Encounter (Signed)
Called [facility], unable to reach. Left message to give [place] a call back.

## 2018-05-30 NOTE — Assessment & Plan Note (Signed)
Previous therapies discussed.  No recurrence identified to date.

## 2018-05-31 DIAGNOSIS — J449 Chronic obstructive pulmonary disease, unspecified: Secondary | ICD-10-CM | POA: Diagnosis not present

## 2018-05-31 DIAGNOSIS — J479 Bronchiectasis, uncomplicated: Secondary | ICD-10-CM | POA: Diagnosis not present

## 2018-05-31 NOTE — Telephone Encounter (Signed)
Patient is aware and nothing further is needed at this time.

## 2018-06-12 ENCOUNTER — Ambulatory Visit
Admission: RE | Admit: 2018-06-12 | Discharge: 2018-06-12 | Disposition: A | Discharge status: Home / Self Care | Payer: PPO | Source: Ambulatory Visit | Attending: Orthopaedic Surgery | Admitting: Orthopaedic Surgery

## 2018-06-12 DIAGNOSIS — M1611 Unilateral primary osteoarthritis, right hip: Secondary | ICD-10-CM | POA: Diagnosis not present

## 2018-06-12 DIAGNOSIS — M25551 Pain in right hip: Secondary | ICD-10-CM

## 2018-06-12 MED ORDER — METHYLPREDNISOLONE ACETATE 40 MG/ML INJ SUSP (RADIOLOG
120.0000 mg | Freq: Once | INTRAMUSCULAR | Status: AC
  Administered 2018-06-12: 120 mg via INTRA_ARTICULAR

## 2018-06-12 MED ORDER — IOPAMIDOL (ISOVUE-M 200) INJECTION 41%
1.0000 mL | Freq: Once | INTRAMUSCULAR | Status: AC
  Administered 2018-06-12: 1 mL via INTRA_ARTICULAR

## 2018-06-16 DIAGNOSIS — J449 Chronic obstructive pulmonary disease, unspecified: Secondary | ICD-10-CM | POA: Diagnosis not present

## 2018-06-16 DIAGNOSIS — J9611 Chronic respiratory failure with hypoxia: Secondary | ICD-10-CM | POA: Diagnosis not present

## 2018-06-20 ENCOUNTER — Other Ambulatory Visit: Payer: Self-pay | Admitting: Internal Medicine

## 2018-06-21 ENCOUNTER — Ambulatory Visit (INDEPENDENT_AMBULATORY_CARE_PROVIDER_SITE_OTHER): Payer: PPO | Admitting: Orthopaedic Surgery

## 2018-06-21 ENCOUNTER — Encounter (INDEPENDENT_AMBULATORY_CARE_PROVIDER_SITE_OTHER): Payer: Self-pay | Admitting: Orthopaedic Surgery

## 2018-06-21 VITALS — BP 127/69 | HR 92 | Wt 142.0 lb

## 2018-06-21 DIAGNOSIS — M1611 Unilateral primary osteoarthritis, right hip: Secondary | ICD-10-CM | POA: Diagnosis not present

## 2018-06-21 NOTE — Progress Notes (Signed)
Office Visit Note   Patient: [name]           Date of Birth: 1932-07-19           MRN: [id number] Visit Date: 06/21/2018              Requested by: Binnie Rail, MD [place], [place] [address] PCP: Binnie Rail, MD   Assessment & Plan: Visit Diagnoses:  1. Unilateral primary osteoarthritis, right hip     Plan: Avascular necrosis right hip.  Cortisone injection was not effective.  Also has arthritis of her right knee and issues with her lumbar spine.  Long talk with [name] and her daughter.  Not sure she is a candidate for hip replacement given all of her medical comorbidities.  We will try over-the-counter medicines and possibly even gabapentin which she can obtain through her primary care physician.  Follow-Up Instructions: Return if symptoms worsen or fail to improve.   Orders:  No orders of the defined types were placed in this encounter.  No orders of the defined types were placed in this encounter.     Procedures: No procedures performed   Clinical Data: No additional findings.   Subjective: Chief Complaint  Patient presents with  . Right Hip - Pain  [name] has evidence of avascular necrosis of her right hip.  I ordered a cortisone injection but she notes she is not sure it made much of a difference.  She does have some chronic issues with relating to her lumbar spine and with arthritis of her right addition she is oxygen dependent.  Ambulates in a wheelchair is able to stand and walk a little bit with a walker.  No change in symptoms since she was last here  HPI  Review of Systems   Objective: Vital Signs: BP 127/69   Pulse 92   Wt 142 lb (64.4 kg)   BMI 22.92 kg/m   Physical Exam Constitutional:      Appearance: She is well-developed.  Eyes:     Pupils: Pupils are equal, round, and reactive to light.  Pulmonary:     Effort: Pulmonary effort is normal.  Skin:    General: Skin is warm and dry.  Neurological:   Mental Status: She is alert and oriented to person, place, and time.  Psychiatric:        Behavior: Behavior normal.     Ortho Exam evaluated in wheelchair with her daughter in attendance.  Has very minimal motion of her right hip with internal and external rotation associated with pain consistent with her diagnosis of avascular necrosis.  Any motion actually aggravates her right knee pain.  No knee effusion.  Some mild patellar crepitation and medial joint discomfort.  Straight leg raise negative  Specialty Comments:  No specialty comments available.  Imaging: No results found.   PMFS History: Patient Active Problem List   Diagnosis Date Noted  . Unilateral primary osteoarthritis, right hip 06/21/2018  . Right knee pain 05/08/2018  . Fall 02/28/2018  . Skin tear of left forearm without complication 78/[id number]/8502  . Degenerative disc disease, cervical 12/06/2017  . DOE (dyspnea on exertion) 11/21/2017  . Fatigue 11/02/2017  . Bilateral swelling of feet 11/02/2017  . Right-sided chest pain 06/02/2017  . Osteopenia 02/16/2017  . Hyperglycemia 12/08/2016  . Degenerative arthritis of right knee 10/27/2016  . 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 Greater trochanteric bursitis of both hips  04/08/2016  . Pain in both knees 04/08/2016  . Chronic midline low back pain without sciatica 04/08/2016  . Dysuria 12/02/2015  . Hip pain 12/02/2015  . Chronic respiratory failure with hypoxia ([place]) 03/07/2015  . Diverticulitis 05/24/2014  . Anxiety   . Bladder prolapse, female, acquired   . Arthritis   . Non-small cell carcinoma of lung, stage 1 ([place]) 01/10/2012  . Neuropathy, peripheral 01/28/2011  . COPD mixed type ([place]) 09/08/2010  . Dyslipidemia   . ATONY OF BLADDER 08/10/2009  . Hypothyroidism 03/12/2008  . Seasonal allergic rhinitis 10/10/2007   Past Medical History:  Diagnosis Date  . Allergic rhinitis   . Anxiety   . Arthritis    "back,  hands; hips" (12/22/2014)  . Asthma   . Bladder atony   . Chronic bronchitis ([place])   . COPD (chronic obstructive pulmonary disease) ([place])   . Diarrhea   . Emphysema of lung ([place])   . GERD (gastroesophageal reflux disease)    "w/spicey foods" (12/22/2014)  . Hyperlipidemia   . Non-small cell carcinoma of lung, stage 1 ([place]) 2005   1.4 cm Poorly differentiated Squamous cell RUL  T1N0 resected 09/22/2003  . On home oxygen therapy    "3L q night and prn during the day" (12/22/2014)  . Pelvic cyst   . Pneumonia    "this is the 3rd time that I can remember" (12/22/2014)  . Unspecified hypothyroidism     Family History  Problem Relation Age of Onset  . Heart disease Father   . Rheumatologic disease Brother   . Cancer Brother        Leukemia  . Rheumatologic disease Sister   . Breast cancer Daughter     Past Surgical History:  Procedure Laterality Date  . ABDOMINAL HYSTERECTOMY  ~ 1976  . BACK SURGERY    . CATARACT EXTRACTION W/ INTRAOCULAR LENS  IMPLANT, BILATERAL Bilateral ~ 2012  . DILATION AND CURETTAGE OF UTERUS    . LUMBAR DISC SURGERY  1970's  . LYMPH NODE DISSECTION  2005   "all my lymph nodes under breasts, went thru my back; Dr. Arlyce Dice did it"  . Needle aspiration Pelvic cyst  2011   Dr Diona Fanti  . VIDEO ASSISTED THORACOSCOPY (VATS)/THOROCOTOMY Right 09/2003   thoracotomy, right upper lobectomy with node dissection; Dr Demetrios Loll 11/02/2010  . VIDEO BRONCHOSCOPY  03/2004   Archie Endo 11/02/2010   Social History   Occupational History  . Occupation: Retired Animator, School system    Employer: RETIRED  Tobacco Use  . Smoking status: Former Smoker    Packs/day: 1.00    Years: 42.00    Pack years: 42.00    Types: Cigarettes    Last attempt to quit: 09/22/2003    Years since quitting: 14.7  . Smokeless tobacco: Never Used  Substance and Sexual Activity  . Alcohol use: Yes    Alcohol/week: 14.0 standard drinks    Types: 14 Glasses of wine per week    Comment:  12/22/2014 "I have a large glass of wine qd"  . Drug use: No  . Sexual activity: Never     Garald Balding, MD   Note - This record has been created using [employer].  Chart creation errors have been sought, but may not always  have been located. Such creation errors do not reflect on  the standard of medical care.

## 2018-06-21 NOTE — Progress Notes (Deleted)
Rt hip--pt stated the injection did not help at all.

## 2018-06-25 IMAGING — MR MR LUMBAR SPINE W/O CM
5 series · 48 of 48 positions shown · non-contrast
Comparison: 01/24/2012

CLINICAL DATA: Lumbar radiculopathy. Bilateral hip pain and right
leg weakness for 6 months. Remote history of lumbar surgery and lung
cancer.

EXAM:
MRI LUMBAR SPINE WITHOUT CONTRAST
TECHNIQUE: Multiplanar, multisequence MR imaging of the lumbar spine was
performed. No intravenous contrast was administered.

[Series 3: T2 · sagittal · 4.0mm · 0.78mm/px · 6 of 15 slices shown (1 of 2)]
[im 1/15]
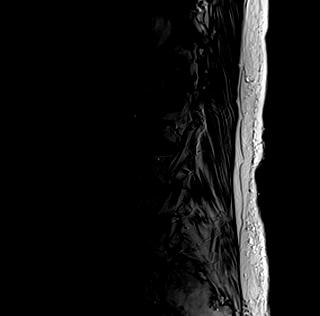
[im 3/15]
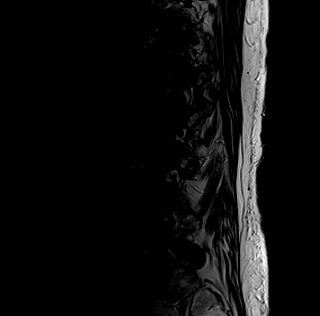
[im 6/15]
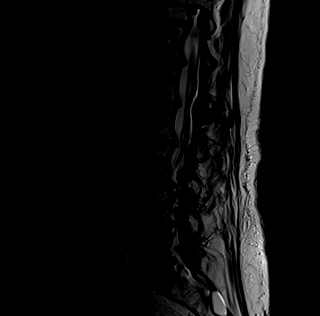
[im 9/15]
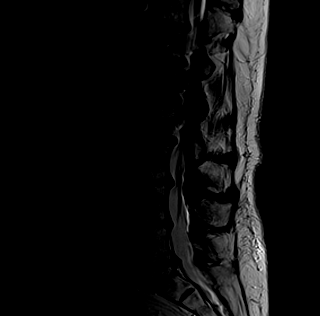
[im 12/15]
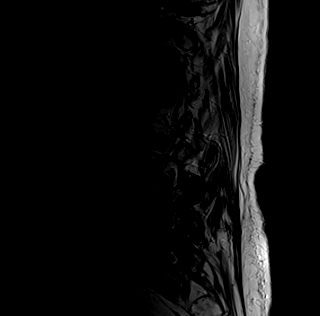
[im 15/15]
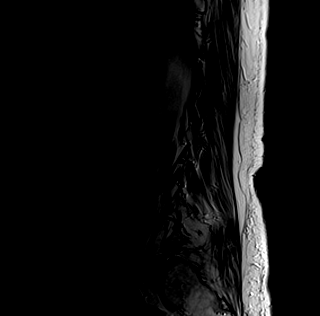

[Series 4: tirm sag · sagittal · 4.0mm · 0.78mm/px · 5 of 15 slices shown]
[im 1/15]
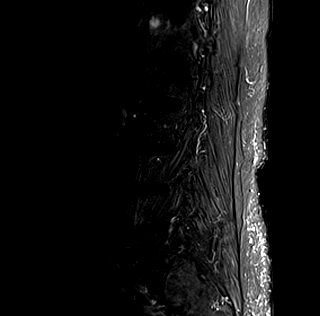
[im 4/15]
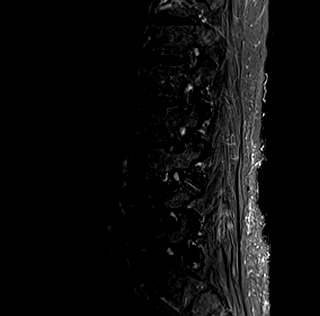
[im 8/15]
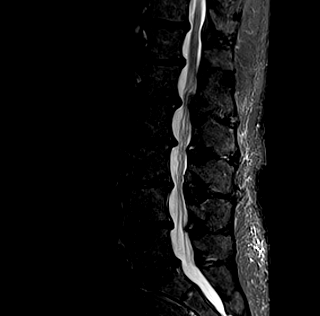
[im 11/15]
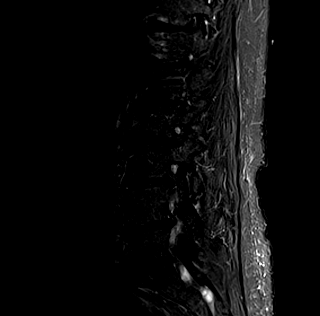
[im 15/15]
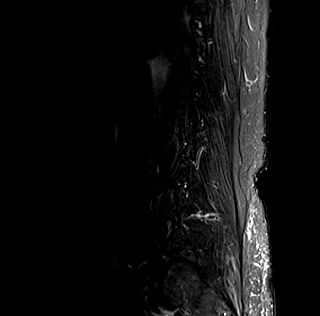

[Series 5: T1 · sagittal · 4.0mm · 0.78mm/px · 5 of 15 slices shown (1 of 2)]
[im 1/15]
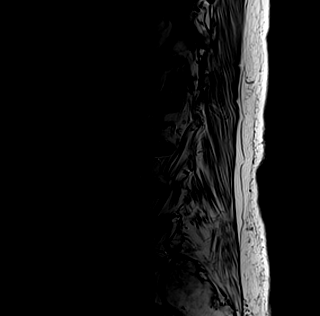
[im 4/15]
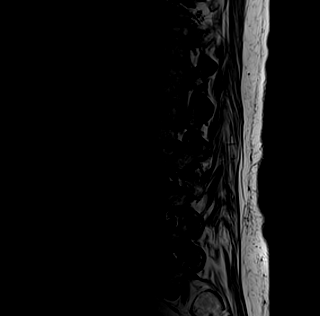
[im 8/15]
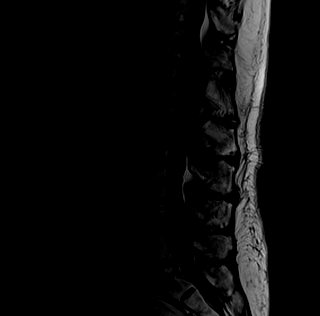
[im 11/15]
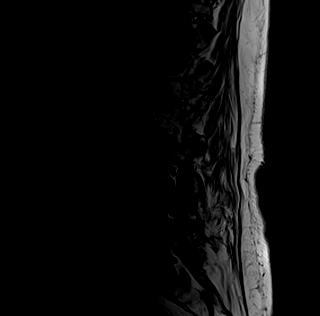
[im 15/15]
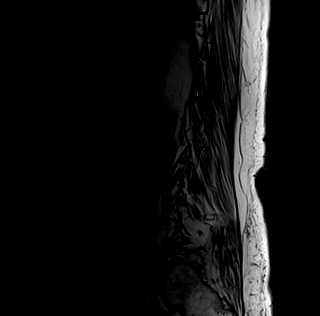

[Series 6: T2 · axial · 4.0mm · 0.70mm/px · z∈[-160,+64]mm · 16 of 45 slices shown (2 of 2)]
[im 1/45]
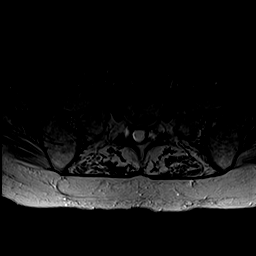
[im 3/45]
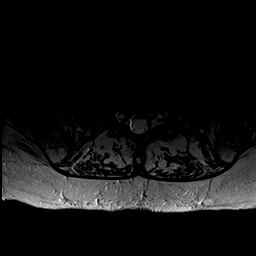
[im 6/45]
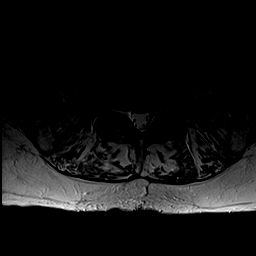
[im 9/45]
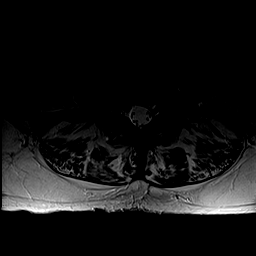
[im 12/45]
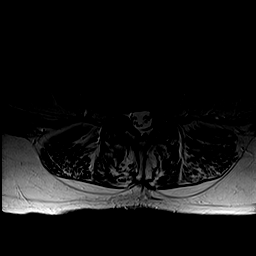
[im 15/45]
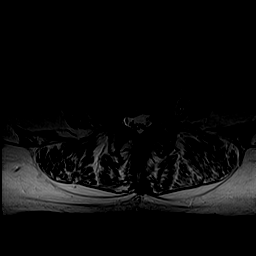
[im 18/45]
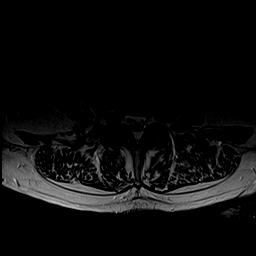
[im 21/45]
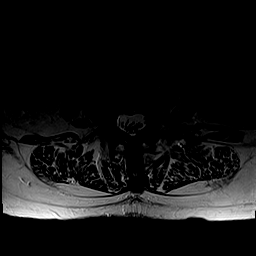
[im 24/45]
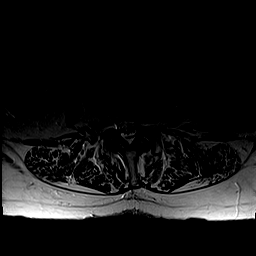
[im 27/45]
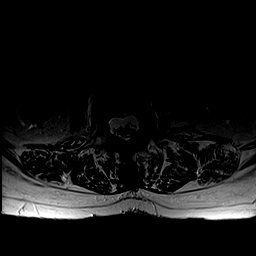
[im 30/45]
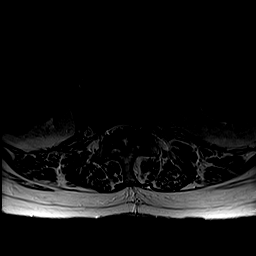
[im 33/45]
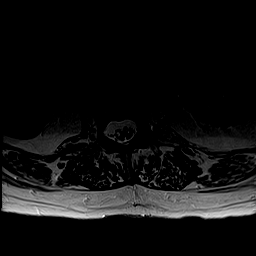
[im 36/45]
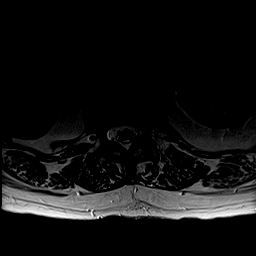
[im 39/45]
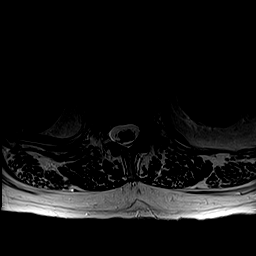
[im 42/45]
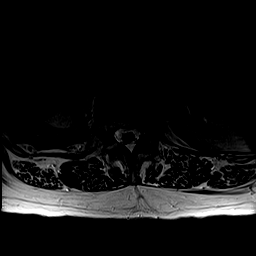
[im 45/45]
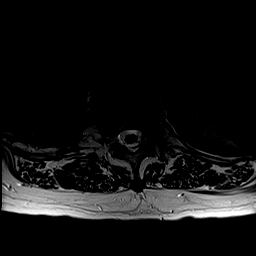

[Series 7: T1 · axial · 4.0mm · 0.70mm/px · z∈[-160,+64]mm · 16 of 45 slices shown (2 of 2)]
[im 1/45]
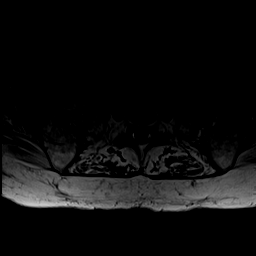
[im 3/45]
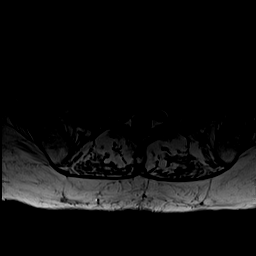
[im 6/45]
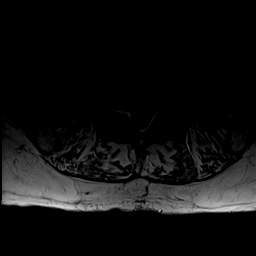
[im 9/45]
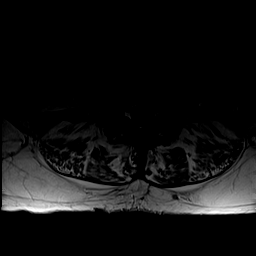
[im 12/45]
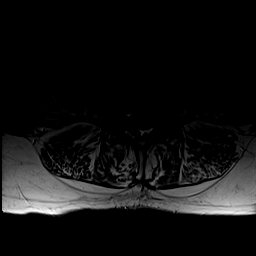
[im 15/45]
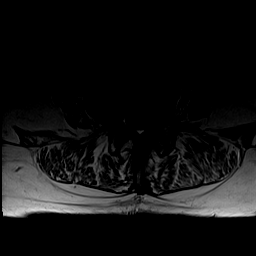
[im 18/45]
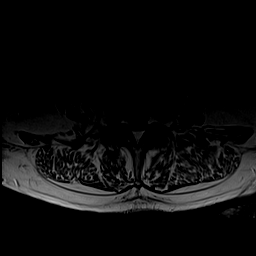
[im 21/45]
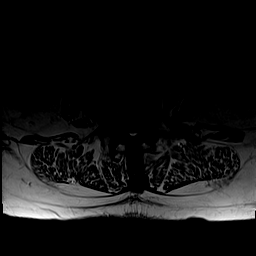
[im 24/45]
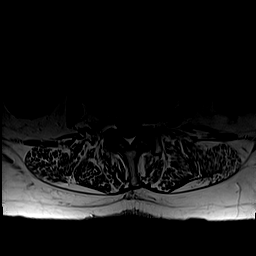
[im 27/45]
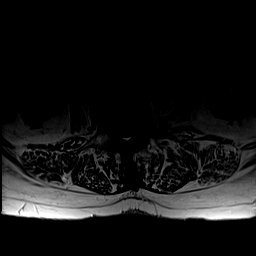
[im 30/45]
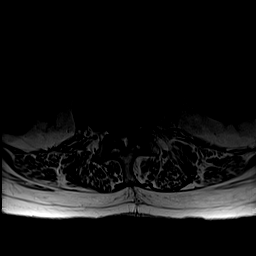
[im 33/45]
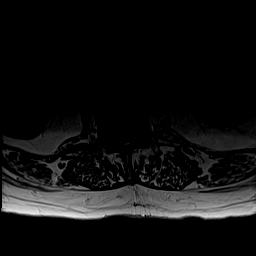
[im 36/45]
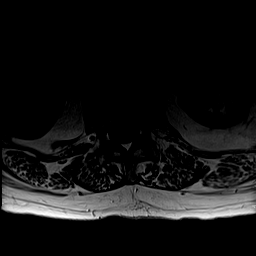
[im 39/45]
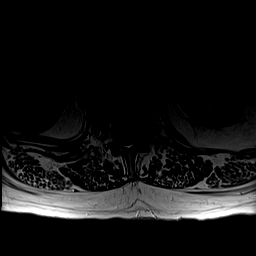
[im 42/45]
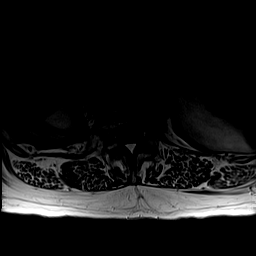
[im 45/45]
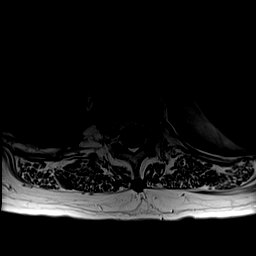

[48 of 48 positions shown; findings below may reference images not displayed]

FINDINGS: Segmentation:  Standard.

Alignment: Straightening of the normal lumbar lordosis. Mild
thoracolumbar dextroscoliosis. No significant listhesis.

Vertebrae: No fracture or suspicious osseous lesion. Type 2
degenerative endplate changes are present throughout the lumbar
spine with scattered small Schmorl's nodes. Minimal degenerative
endplate edema is noted at L4-5.

Conus medullaris: Extends to the L1-2 level and appears normal.

Paraspinal and other soft tissues: 3 mm T2 hyperintense focus in the
posterior right hepatic lobe, indeterminate on this examination
though corresponds to a subtle hypodensity on a 4376 abdominal CT
and is likely benign.

Disc levels:

Disc desiccation and moderate to severe disc space narrowing
throughout the lumbar spine.

T11-12: Circumferential disc bulging has mildly increased and
contributes to mild spinal stenosis. There is mild bilateral facet
arthrosis. No neural foraminal stenosis.

T12-L1: Circumferential disc bulging mildly greater to the left has
mildly increased and along with mild facet arthrosis results in
borderline left lateral recess stenosis and borderline left neural
foraminal stenosis. No spinal stenosis.

L1-2: Circumferential disc bulging greater to the left and
mild-to-moderate facet and ligamentum flavum hypertrophy result in
borderline spinal stenosis, mild left lateral recess stenosis, and
mild left neural foraminal stenosis, increased from prior.

L2-3: Circumferential disc bulging and mild-to-moderate facet and
ligamentum flavum hypertrophy result in borderline spinal stenosis,
borderline bilateral lateral recess stenosis, and borderline right
and mild left neural foraminal stenosis, overall stable to minimally
progressed.

L3-4: Disc space narrowing has slightly progressed. Circumferential
disc bulging and moderate facet and ligamentum flavum hypertrophy
result in mild spinal stenosis and mild right neural foraminal
stenosis, stable to slightly increased. A right foraminal to
extraforaminal disc protrusion is again noted which contacts the
extraforaminal L3 nerve.

L4-5: Prior left laminectomy again noted. Circumferential disc
bulging asymmetric to the right and moderate facet arthrosis result
in mild-to-moderate right and mild left neural foraminal stenosis
without spinal stenosis, not significantly changed.

L5-S1: Circumferential disc bulging and mild facet and ligamentum
flavum hypertrophy result in moderate right and mild-to-moderate
left neural foraminal stenosis without spinal stenosis, unchanged.
IMPRESSION: 1. Advanced diffuse lumbar disc degeneration. Mild interval
progression from T[DATE]-L3-4 with up to mild spinal, lateral recess,
and neural foraminal stenosis as above.
2. Unchanged disc and facet degeneration at L4-5 and L5-S1 with
mild-to-moderate neural foraminal stenosis.

## 2018-06-26 ENCOUNTER — Ambulatory Visit (INDEPENDENT_AMBULATORY_CARE_PROVIDER_SITE_OTHER): Payer: PPO | Admitting: Orthopaedic Surgery

## 2018-06-28 ENCOUNTER — Telehealth: Payer: Self-pay | Admitting: Internal Medicine

## 2018-06-28 MED ORDER — FLUTICASONE PROPIONATE HFA 110 MCG/ACT IN AERO: INHALATION_SPRAY | RESPIRATORY_TRACT | 5 refills | Status: DC

## 2018-06-28 NOTE — Telephone Encounter (Signed)
Called and spoke to pt, who is requesting refill on Flovent HFA. Pt last seen CY 05/03/18 and instructed to continue Flovent. Rx for Flovent 110 MCG has been sent to preferred pharmacy. Nothing further is needed.

## 2018-07-01 DIAGNOSIS — J479 Bronchiectasis, uncomplicated: Secondary | ICD-10-CM | POA: Diagnosis not present

## 2018-07-01 IMAGING — XA Imaging study
2 series · 2 of 2 positions shown · non-contrast
Comparison: none

CLINICAL DATA: Lumbosacral spondylosis without myelopathy. Low back
pain and right greater than left hip and leg pain.

[Series 2: ortho standard · 1 of 1 slices shown (1 of 2)]
[im 1/1]
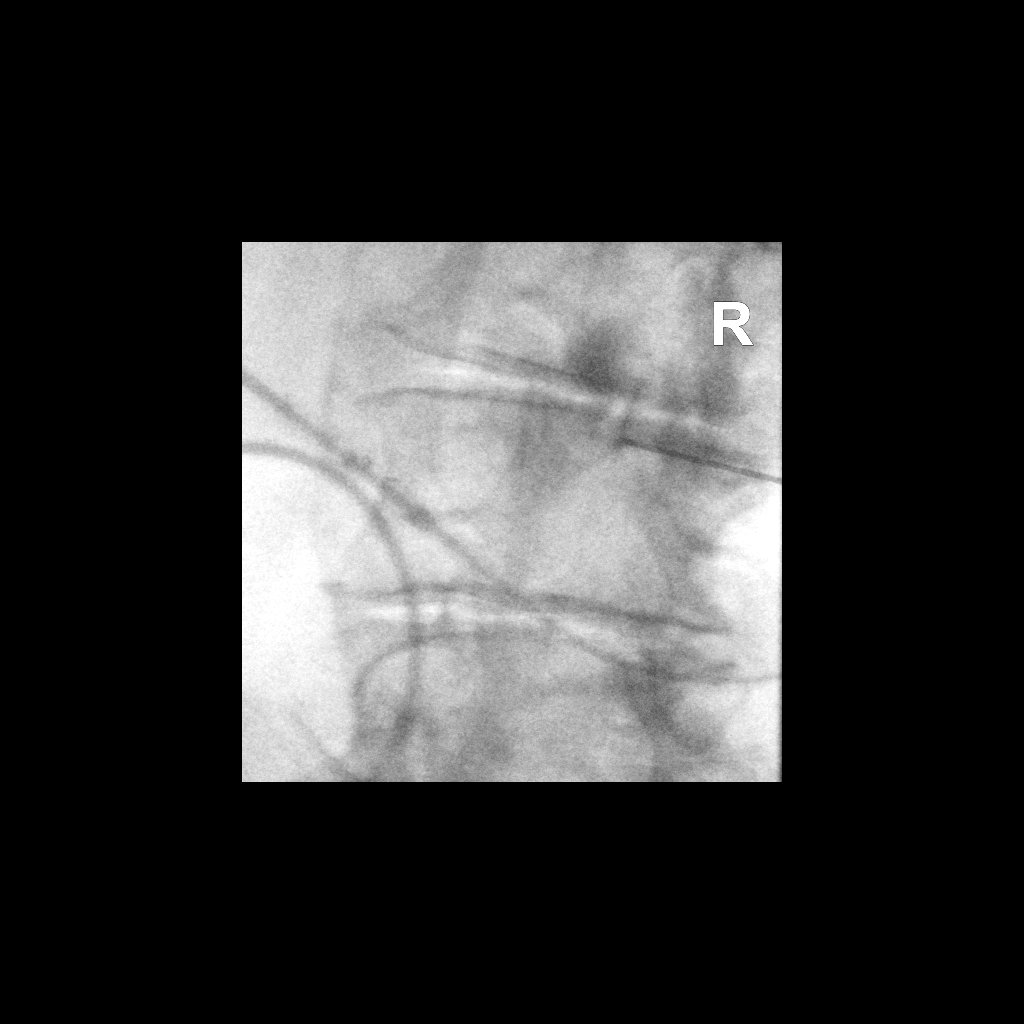

[Series 3: ortho standard · 1 of 1 slices shown (2 of 2)]
[im 1/1]
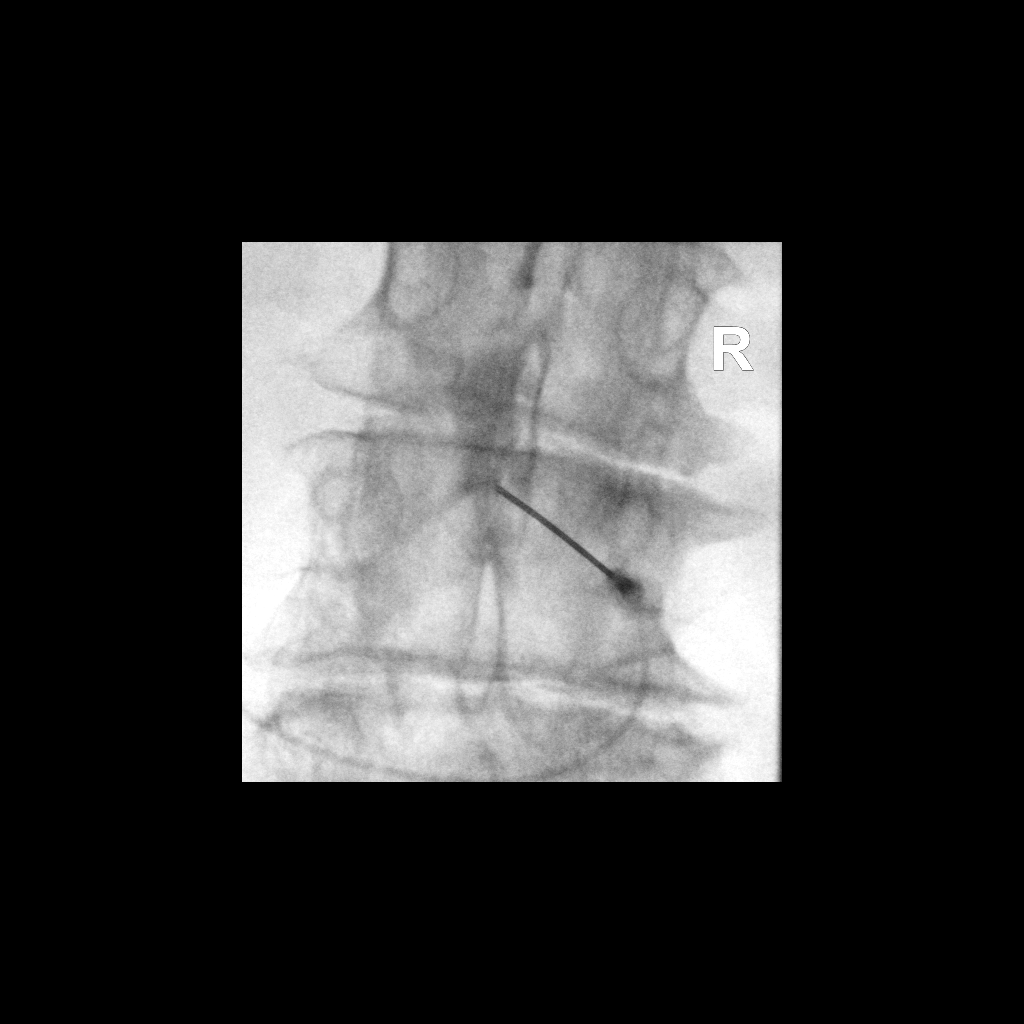

[2 of 2 positions shown; findings below may reference images not displayed]

FLUOROSCOPY TIME:  Radiation Exposure Index (as provided by the
fluoroscopic device): 28.44 microGray*m^2

Fluoroscopy Time (in minutes and seconds):  8 seconds

PROCEDURE:
The procedure, risks, benefits, and alternatives were explained to
the patient. Questions regarding the procedure were encouraged and
answered. The patient understands and consents to the procedure.

LUMBAR EPIDURAL INJECTION:

An interlaminar approach was performed on the right at L3-4. The
overlying skin was cleansed and anesthetized. A 3.5 inch 20 gauge
epidural needle was advanced using loss-of-resistance technique.

DIAGNOSTIC EPIDURAL INJECTION:

Injection of Isovue-M 200 shows a good epidural pattern with spread
above and below the level of needle placement. Contrast spread
bilaterally, although midline needle positioning resulted in greater
leftward spread than what was ideally planned. However, due to the
patient's discomfort lying on the table it was decided not to
attempt needle repositioning. No vascular opacification is seen.

THERAPEUTIC EPIDURAL INJECTION:

120 mg of Depo-Medrol mixed with 3 mL of 1% lidocaine were
instilled. The procedure was well-tolerated, and the patient was
discharged thirty minutes following the injection in good condition.

COMPLICATIONS:
None
IMPRESSION: Technically successful lumbar interlaminar epidural injection at
L3-4.

## 2018-07-17 DIAGNOSIS — J449 Chronic obstructive pulmonary disease, unspecified: Secondary | ICD-10-CM | POA: Diagnosis not present

## 2018-07-17 DIAGNOSIS — J9611 Chronic respiratory failure with hypoxia: Secondary | ICD-10-CM | POA: Diagnosis not present

## 2018-07-21 IMAGING — DX DG CHEST 2V
2 series · 2 of 2 positions shown · non-contrast
Comparison: PA and lateral chest 12/16/2015 and 08/11/2016. CT
chest 10/14/2014.

CLINICAL DATA: Shortness of breath, nausea and low-grade fever for
5 days.

EXAM:
CHEST  2 VIEW

[chest pa]
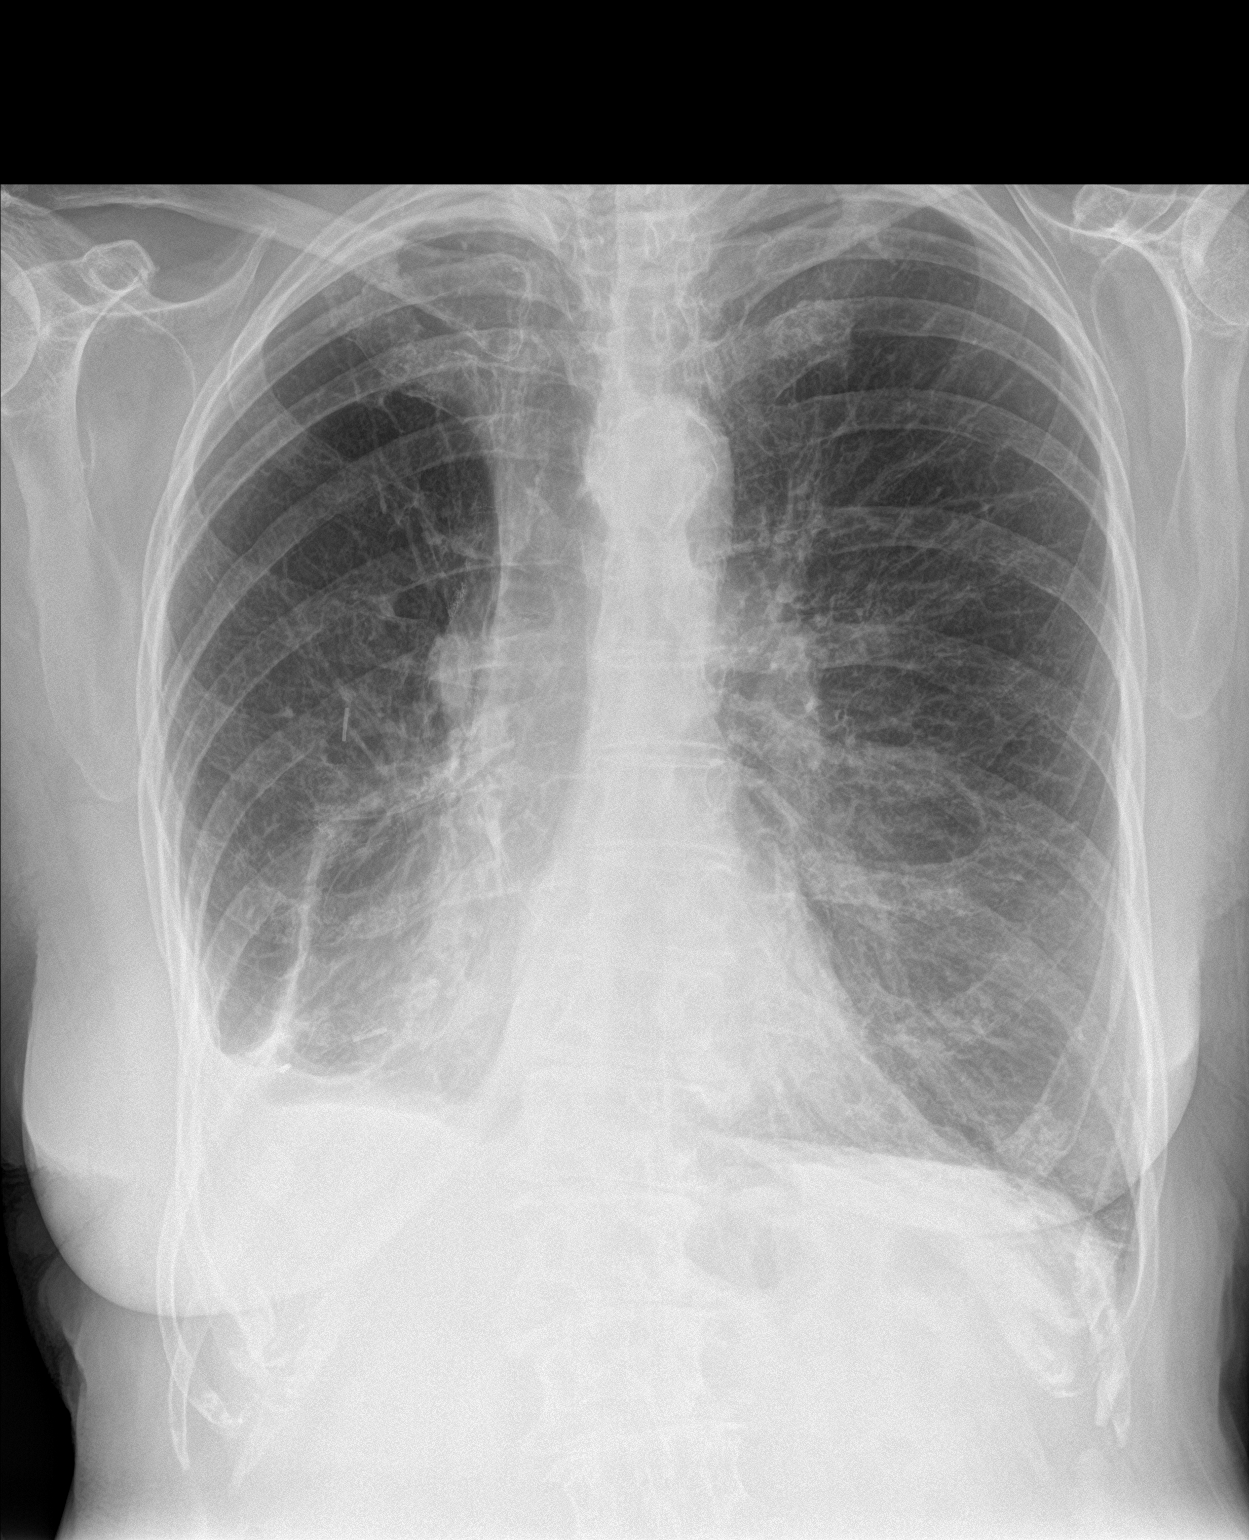

[chest lat]
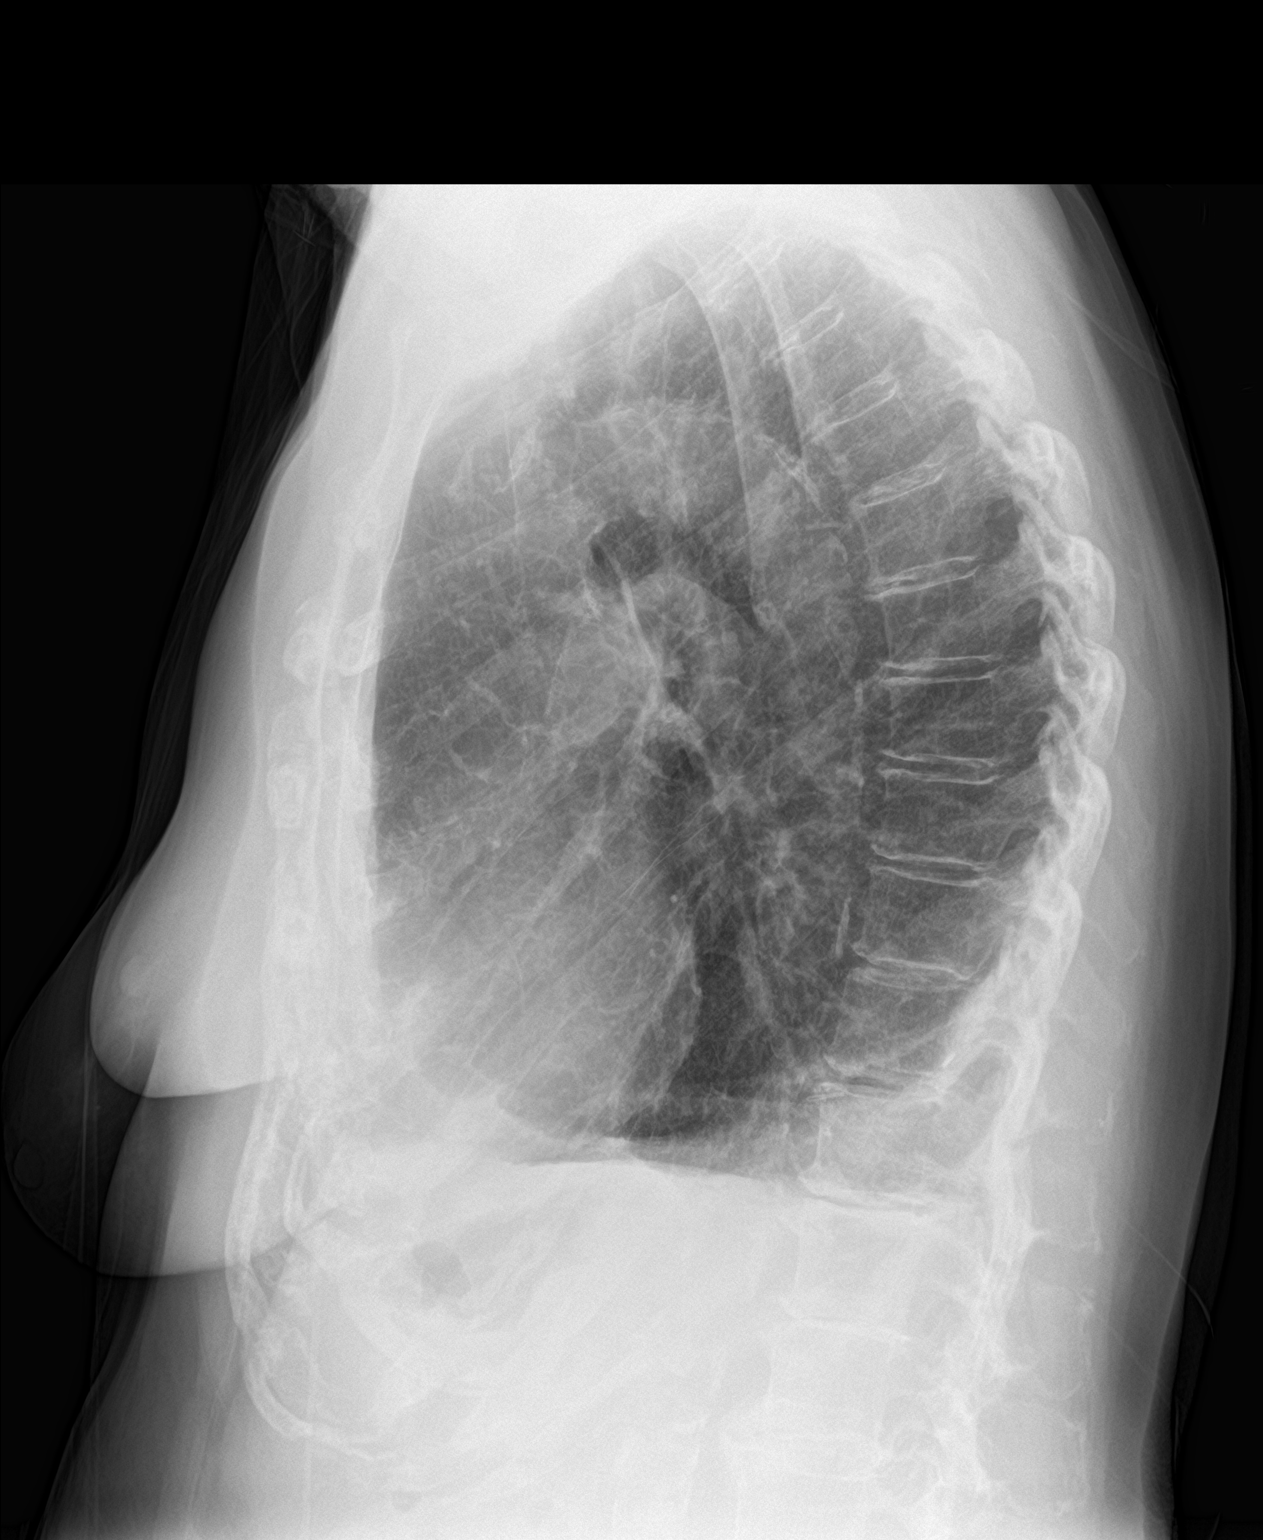

[2 of 2 positions shown; findings below may reference images not displayed]

FINDINGS: Surgical clips in the right chest with volume loss in the right
lower lung zone and scarring appear unchanged. The lungs are
emphysematous. No consolidative process, pneumothorax or effusion.
Heart size is normal. Aortic atherosclerosis is noted. No acute bony
abnormality.
IMPRESSION: No acute disease.

Postoperative change right chest.

COPD.

Atherosclerosis.

## 2018-07-28 ENCOUNTER — Other Ambulatory Visit: Payer: Self-pay | Admitting: Internal Medicine

## 2018-08-01 DIAGNOSIS — J449 Chronic obstructive pulmonary disease, unspecified: Secondary | ICD-10-CM | POA: Diagnosis not present

## 2018-08-14 ENCOUNTER — Other Ambulatory Visit: Payer: Self-pay | Admitting: Internal Medicine

## 2018-08-14 NOTE — Telephone Encounter (Signed)
Check [place] registry last filled 06/15/2018.Marland KitchenJohny Chess

## 2018-08-17 DIAGNOSIS — J449 Chronic obstructive pulmonary disease, unspecified: Secondary | ICD-10-CM | POA: Diagnosis not present

## 2018-08-17 DIAGNOSIS — J9611 Chronic respiratory failure with hypoxia: Secondary | ICD-10-CM | POA: Diagnosis not present

## 2018-08-22 ENCOUNTER — Telehealth: Payer: Self-pay | Admitting: Internal Medicine

## 2018-08-22 NOTE — Telephone Encounter (Signed)
Called and spoke with patient, she stated that she received a bill from [employer] stating that she would have to pay the payment of 1488.40$ for the vest that was ordered for her. Patient stated that the reason this was denied payment was due to it being filled past the date of 04/17/2017. Patient stated that she had returned this because she was not able to use it. She would like this taken care of.   Kathlee Nations please advise, thank you.

## 2018-08-24 NOTE — Telephone Encounter (Signed)
Message routed to [place] for updates

## 2018-08-28 NOTE — Telephone Encounter (Signed)
[name] please advise on updates thank you.

## 2018-08-30 DIAGNOSIS — J449 Chronic obstructive pulmonary disease, unspecified: Secondary | ICD-10-CM | POA: Diagnosis not present

## 2018-08-30 NOTE — Telephone Encounter (Signed)
Still awaiting update on this from [place].

## 2018-09-03 NOTE — Telephone Encounter (Signed)
[name] can you advise on any update for this? Patient states that she is unable to pay this amount for a vest that she was not able to use. Thank you.

## 2018-09-10 NOTE — Telephone Encounter (Signed)
LVM for patient to return call regards to be concerns. X1

## 2018-09-10 NOTE — Telephone Encounter (Signed)
If this is for any sort of DME, I have no access to the billing for it.  She will need to contact the DME company she received the vest from to make sure they show the vest has been returned.

## 2018-09-11 NOTE — Telephone Encounter (Signed)
Attempted to call pt but unable to reach. Left message for pt to return call. 

## 2018-09-11 NOTE — Telephone Encounter (Signed)
Spoke with pt. I have advised her that she needs to contact the company who supplied her with the vest. She verbalized understanding. Nothing further was needed.

## 2018-09-15 DIAGNOSIS — J9611 Chronic respiratory failure with hypoxia: Secondary | ICD-10-CM | POA: Diagnosis not present

## 2018-09-15 DIAGNOSIS — J449 Chronic obstructive pulmonary disease, unspecified: Secondary | ICD-10-CM | POA: Diagnosis not present

## 2018-09-17 ENCOUNTER — Encounter: Payer: Self-pay | Admitting: Internal Medicine

## 2018-09-17 ENCOUNTER — Ambulatory Visit (INDEPENDENT_AMBULATORY_CARE_PROVIDER_SITE_OTHER): Payer: PPO | Admitting: Internal Medicine

## 2018-09-17 ENCOUNTER — Other Ambulatory Visit: Payer: Self-pay

## 2018-09-17 VITALS — BP 138/62 | Temp 98.7°F | Resp 16 | Ht 66.0 in

## 2018-09-17 DIAGNOSIS — K1120 Sialoadenitis, unspecified: Secondary | ICD-10-CM

## 2018-09-17 MED ORDER — ONDANSETRON 4 MG PO TBDP: 4.0000 mg | ORAL_TABLET | Freq: Three times a day (TID) | ORAL | 0 refills | Status: DC | PRN

## 2018-09-17 MED ORDER — CEPHALEXIN 500 MG PO CAPS: 500.0000 mg | ORAL_CAPSULE | Freq: Three times a day (TID) | ORAL | 0 refills | Status: DC

## 2018-09-17 NOTE — Patient Instructions (Signed)
Take the antibiotic as prescribed.  Use the anti-nausea medication if needed.  Call if you have any side effects.   Start gentle massage and warm compresses on the area.  Increase your water intake.  Suck on sour lemon candy.   Call if there is no improvement.    Parotitis  Parotitis is inflammation of one or both of your parotid glands. These glands produce saliva. They are found on each side of your face, below and in front of your earlobes. The saliva that they produce comes out of tiny openings (ducts) inside your cheeks. Parotitis may cause sudden swelling and pain (acute parotitis). It can also cause repeated episodes of swelling and pain or continued swelling that may or may not be painful (chronic parotitis). What are the causes? This condition may be caused by:  Infections from bacteria.  Infections from viruses, such as mumps or HIV.  Blockage (obstruction) of saliva flow through the parotid glands. This can be from a stone, scar tissue, or a tumor.  Diseases that cause your body's defense system (immune system) to attack healthy cells in your salivary glands. These are called autoimmune diseases. What increases the risk? You are more likely to develop this condition if:  You are 84 years old or older.  You do not drink enough fluids (are dehydrated).  You drink too much alcohol.  You have: ? A dry mouth. ? Poor dental hygiene. ? Diabetes. ? Gout. ? A long-term illness.  You have had radiation treatments to the head and neck.  You take certain medicines. What are the signs or symptoms? Symptoms of this condition depend on the cause. Symptoms may include:  Swelling under and in front of the ear. This may get worse after eating.  Redness of the skin over the parotid gland.  Pain and tenderness over the parotid gland. This may get worse after eating.  Fever or chills.  Pus coming from the ducts inside the mouth.  Dry mouth.  A bad taste in the mouth.  How is this diagnosed? This condition may be diagnosed based on:  Your medical history.  A physical exam.  Tests to find the cause of the parotitis. These may include: ? Doing blood tests to check for an autoimmune disease or infections from a virus. ? Taking a fluid sample from the parotid gland and testing it for infection. ? Injecting the ducts of the parotid gland with a dye and then taking X-rays (sialogram). ? Having other imaging tests of the gland, such as X-rays, ultrasound, MRI, or CT scan. ? Checking the opening of the gland for a stone or obstruction. ? Placing a needle into the gland to remove tissue for a biopsy (fine needle aspiration). How is this treated? Treatment for this condition depends on the cause. Treatment may include:  Antibiotic medicine for a bacterial infection.  Drinking more fluids.  Removing a stone or obstruction.  Treating an underlying disease that is causing parotitis.  Surgery to drain an infection, remove a growth, or remove the whole gland (parotidectomy). Treatment may not be needed if parotid swelling goes away with home care. Follow these instructions at home: Medicines   Take over-the-counter and prescription medicines only as told by your health care provider.  If you were prescribed an antibiotic medicine, take it as told by your health care provider. Do not stop taking the antibiotic even if you start to feel better. Managing pain and swelling  If directed, apply heat to the affected  area as often as told by your health care provider. Use the heat source that your health care provider recommends, such as a moist heat pack or a heating pad. To apply the heat: ? Place a towel between your skin and the heat source. ? Leave the heat on for 20-30 minutes. ? Remove the heat if your skin turns bright red. This is especially important if you are unable to feel pain, heat, or cold. You may have a greater risk of getting burned.  Gargle  with a salt-water mixture 3-4 times a day or as needed. To make a salt-water mixture, completely dissolve -1 tsp (3-6 g) of salt in 1 cup (237 mL) of warm water.  Gently massage the parotid glands as told by your health care provider. General instructions   Drink enough fluid to keep your urine pale yellow.  Keep your mouth clean and moist.  Try sucking on sour candy. This may help to make your mouth less dry by stimulating the flow of saliva.  Maintain good oral health. ? Brush your teeth at least two times a day. ? Floss your teeth every day. ? See your dentist regularly.  Do not use any products that contain nicotine or tobacco, such as cigarettes, e-cigarettes, and chewing tobacco. If you need help quitting, ask your health care provider.  Do not drink alcohol.  Keep all follow-up visits as told by your health care provider. This is important. Contact a health care provider if:  You have a fever or chills.  You have new symptoms.  Your symptoms get worse.  Your symptoms do not improve with treatment. Get help right away if:  You have difficulty breathing or swallowing because of the swollen gland. Summary  Parotitis is inflammation of one or both of your parotid glands.  Symptoms include pain and swelling under and in front of the ear. They may also include a fever and a bad taste in your mouth.  This condition may be treated with antibiotics, increasing fluids, or surgery.  In some cases, parotitis may go away on its own without treatment.  You should drink plenty of fluids, maintain good oral hygiene, and avoid tobacco products. This information is not intended to replace advice given to you by your health care provider. Make sure you discuss any questions you have with your health care provider. Document Released: 11/26/2001 Document Revised: 01/02/2018 Document Reviewed: 01/02/2018 Elsevier Interactive Patient Education  2019 [employer].

## 2018-09-17 NOTE — Assessment & Plan Note (Signed)
Exam c/w parotiditis ? Stone There does appear to be an infection Start keflex 500 mg TID x 10 days - may be able to discontinue early.  She has had focal time with most antibiotics causing nausea. Zofran as needed. She will call if she does not tolerate the antibiotic Advised warm compresses, gentle massaging of the area Increase water intake Soft and hard sour lemon candy If no improvement may need imaging and referral to ENT

## 2018-09-17 NOTE — Progress Notes (Signed)
Subjective:    Patient ID: [name], female    DOB: 04/06/1933, 85 y.o.   MRN: [id number]  HPI The patient is here for an acute visit.  She is here with her daughter.     Last week she had a swollen lymph node on the right side of her neck.  It was sore.  She was concerned it may have been her tonsil so she gargled with salt water and Listerine.  It did go away.  2 mornings ago when she woke up it was there again and she did the same thing and took Tylenol.  This morning when she woke up she had swelling from her right base of her ear all the way to her chin and the right side of her neck.  It is tender and slightly erythematous.  She has never had anything like this before.  She was concerned it was her tonsils.  She denies any sore throat, difficulty swallowing or pain with swallowing.  She has not had any fevers or chills, nasal congestion, sinus pain/pressure, ear pain inside the ear.  The area does hurt more when she opens her mouth.  She has a dry cough at baseline and it is stable.  She has not had any wheezing or shortness of breath.  She is on oxygen chronically.    Medications and allergies reviewed with patient and updated if appropriate.  Patient Active Problem List   Diagnosis Date Noted  . Unilateral primary osteoarthritis, right hip 06/21/2018  . Right knee pain 05/08/2018  . Fall 02/28/2018  . Skin tear of left forearm without complication 30/02/2329  . Degenerative disc disease, cervical 12/06/2017  . DOE (dyspnea on exertion) 11/21/2017  . Fatigue 11/02/2017  . Bilateral swelling of feet 11/02/2017  . Right-sided chest pain 06/02/2017  . Osteopenia 02/16/2017  . Hyperglycemia 12/08/2016  . Degenerative arthritis of right knee 10/27/2016  . 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 Greater trochanteric bursitis of both hips 04/08/2016  . Pain in both knees 04/08/2016  . Chronic midline low back pain without sciatica  04/08/2016  . Dysuria 12/02/2015  . Hip pain 12/02/2015  . Chronic respiratory failure with hypoxia ([place]) 03/07/2015  . Diverticulitis 05/24/2014  . Anxiety   . Bladder prolapse, female, acquired   . Arthritis   . Non-small cell carcinoma of lung, stage 1 ([place]) 01/10/2012  . Neuropathy, peripheral 01/28/2011  . COPD mixed type ([place]) 09/08/2010  . Dyslipidemia   . ATONY OF BLADDER 08/10/2009  . Hypothyroidism 03/12/2008  . Seasonal allergic rhinitis 10/10/2007    Current Outpatient Medications on File Prior to Visit  Medication Sig Dispense Refill  . acetaminophen (TYLENOL ARTHRITIS PAIN) 650 MG CR tablet Take 650 mg by mouth daily as needed for pain.     [name] ALPRAZolam (XANAX) 1 MG tablet TAKE 1 TABLET BY MOUTH AT BEDTIME 30 tablet 1  . atorvastatin (LIPITOR) 20 MG tablet TAKE 1 TABLET (20 MG TOTAL) BY MOUTH DAILY. (Patient taking differently: Take 20 mg by mouth every other day. ) 90 tablet 1  . Cholecalciferol (VITAMIN D) 2000 UNITS tablet Take 1 tablet (2,000 Units total) by mouth daily. 30 tablet 11  . fluticasone (FLOVENT HFA) 110 MCG/ACT inhaler TAKE 2 PUFFS BY MOUTH TWICE A DAY 12 Inhaler 5  . levalbuterol (XOPENEX) 1.25 MG/3ML nebulizer solution Take 1.25 mg by nebulization every 6 (six) hours as needed for wheezing. DX: Bronchiectasis 360 mL 3  .  levalbuterol (XOPENEX) 1.25 MG/3ML nebulizer solution USE 1 NEBULE EVERY 8 HOURS AS NEEDED FOR WHEEZING 360 mL 2  . levalbuterol (XOPENEX) 1.25 MG/3ML nebulizer solution USE 1 NEBULE EVERY 8 HOURS AS NEEDED FOR WHEEZING 360 mL 2  . levothyroxine (SYNTHROID, LEVOTHROID) 88 MCG tablet TAKE 1 TABLET BY MOUTH EVERY DAY 90 tablet 1  . Multiple Vitamins-Minerals (CENTRUM SILVER PO) Take 1 tablet by mouth daily.     . ondansetron (ZOFRAN) 4 MG tablet Take 1 tablet (4 mg total) by mouth every 8 (eight) hours as needed for nausea or vomiting. 20 tablet 0  . Probiotic Product (PROBIOTIC PO) Take 1 capsule by mouth daily.    [name] Respiratory Therapy  Supplies (FLUTTER) DEVI Blow through 4 times per cycle and repeat 3 cycles per day 1 each 0  . Respiratory Therapy Supplies (NEBULIZER/TUBING/MOUTHPIECE) KIT As directed 3 each 12   No current facility-administered medications on file prior to visit.     Past Medical History:  Diagnosis Date  . Allergic rhinitis   . Anxiety   . Arthritis    "back, hands; hips" (12/22/2014)  . Asthma   . Bladder atony   . Chronic bronchitis ([place])   . COPD (chronic obstructive pulmonary disease) ([place])   . Diarrhea   . Emphysema of lung ([place])   . GERD (gastroesophageal reflux disease)    "w/spicey foods" (12/22/2014)  . Hyperlipidemia   . Non-small cell carcinoma of lung, stage 1 ([place]) 2005   1.4 cm Poorly differentiated Squamous cell RUL  T1N0 resected 09/22/2003  . On home oxygen therapy    "3L q night and prn during the day" (12/22/2014)  . Pelvic cyst   . Pneumonia    "this is the 3rd time that I can remember" (12/22/2014)  . Unspecified hypothyroidism     Past Surgical History:  Procedure Laterality Date  . ABDOMINAL HYSTERECTOMY  ~ 1976  . BACK SURGERY    . CATARACT EXTRACTION W/ INTRAOCULAR LENS  IMPLANT, BILATERAL Bilateral ~ 2012  . DILATION AND CURETTAGE OF UTERUS    . LUMBAR DISC SURGERY  1970's  . LYMPH NODE DISSECTION  2005   "all my lymph nodes under breasts, went thru my back; Dr. Arlyce Dice did it"  . Needle aspiration Pelvic cyst  2011   Dr Diona Fanti  . VIDEO ASSISTED THORACOSCOPY (VATS)/THOROCOTOMY Right 09/2003   thoracotomy, right upper lobectomy with node dissection; Dr Demetrios Loll 11/02/2010  . VIDEO BRONCHOSCOPY  03/2004   Archie Endo 11/02/2010    Social History   Socioeconomic History  . Marital status: Married    Spouse name: Not on file  . Number of children: 2  . Years of education: 66  . Highest education level: High school graduate  Occupational History  . Occupation: Retired Animator, Psychiatrist: RETIRED  Social Needs  . Financial resource  strain: Not on file  . Food insecurity:    Worry: Not on file    Inability: Not on file  . Transportation needs:    Medical: Not on file    Non-medical: Not on file  Tobacco Use  . Smoking status: Former Smoker    Packs/day: 1.00    Years: 42.00    Pack years: 42.00    Types: Cigarettes    Last attempt to quit: 09/22/2003    Years since quitting: 14.9  . Smokeless tobacco: Never Used  Substance and Sexual Activity  . Alcohol use: Yes    Alcohol/week: 14.0 standard  drinks    Types: 14 Glasses of wine per week    Comment: 12/22/2014 "I have a large glass of wine qd"  . Drug use: No  . Sexual activity: Never  Lifestyle  . Physical activity:    Days per week: Not on file    Minutes per session: Not on file  . Stress: Not on file  Relationships  . Social connections:    Talks on phone: Not on file    Gets together: Not on file    Attends religious service: Not on file    Active member of club or organization: Not on file    Attends meetings of clubs or organizations: Not on file    Relationship status: Not on file  Other Topics Concern  . Not on file  Social History Narrative   Lives with husband in a one story home.  Has 2 children.  Retired.  Education: high school.     Family History  Problem Relation Age of Onset  . Heart disease Father   . Rheumatologic disease Brother   . Cancer Brother        Leukemia  . Rheumatologic disease Sister   . Breast cancer Daughter     Review of Systems  Constitutional: Negative for chills and fever.  HENT: Negative for congestion, ear pain, sinus pressure, sinus pain, sore throat and trouble swallowing.   Respiratory: Positive for cough (Chronic at baseline). Negative for shortness of breath and wheezing.   Cardiovascular: Negative for chest pain.  Neurological: Negative for light-headedness and headaches.  Hematological: Positive for adenopathy.       Objective:   Vitals:   09/17/18 1124  BP: 138/62  Resp: 16  Temp: 98.7  F (37.1 C)   BP Readings from Last 3 Encounters:  09/17/18 138/62  06/21/18 127/69  05/28/18 (!) 118/59   Wt Readings from Last 3 Encounters:  06/21/18 142 lb (64.4 kg)  05/28/18 142 lb (64.4 kg)  05/08/18 142 lb (64.4 kg)   Body mass index is 22.92 kg/m.   Physical Exam Constitutional:      General: She is not in acute distress.    Appearance: Normal appearance. She is not ill-appearing.  HENT:     Head: Normocephalic and atraumatic.     Right Ear: Tympanic membrane, ear canal and external ear normal.     Left Ear: Tympanic membrane, ear canal and external ear normal.     Nose: Nose normal.     Mouth/Throat:     Mouth: Mucous membranes are moist.     Pharynx: No oropharyngeal exudate or posterior oropharyngeal erythema.  Eyes:     Conjunctiva/sclera: Conjunctivae normal.  Neck:     Comments: Right parotid gland swollen, firm and tender, ? Submandibular gland tender,  Mild generalized swelling right side of neck, area of soft, puffiness distal jaw in soft tissue w/o fluctuance Cardiovascular:     Rate and Rhythm: Normal rate and regular rhythm.  Pulmonary:     Effort: Pulmonary effort is normal. No respiratory distress.     Breath sounds: No wheezing or rhonchi.  Skin:    General: Skin is warm.     Findings: Erythema (Mild generalized erythema right side of neck) present.  Neurological:     Mental Status: She is alert.     Cranial Nerves: No cranial nerve deficit.            Assessment & Plan:    See Problem List for Assessment and  Plan of chronic medical problems.

## 2018-09-23 IMAGING — CT CT CHEST W/O CM
2 of 3 series · 15 of 36 positions shown, 18 images · non-contrast
Comparison: CT scan 10/14/2014

ADDENDUM:
I reviewed this study with Dr. Jamarion. The patient does have areas of
mild/ moderate bronchiectasis most notably in the right lower lobe
and left upper lobe.
CLINICAL DATA: Remote history of lung cancer in 2442. History of
bronchiectasis and COPD. Worsening productive cough for 3 weeks.

EXAM:
CT CHEST WITHOUT CONTRAST
TECHNIQUE: Multidetector CT imaging of the chest was performed following the
standard protocol without IV contrast.

[Series 2: thorax · axial · 0.69mm/px · z∈[-336,-66]mm · 12 of 159 slices shown, 15 images]
[im 12/159  mediastinal]
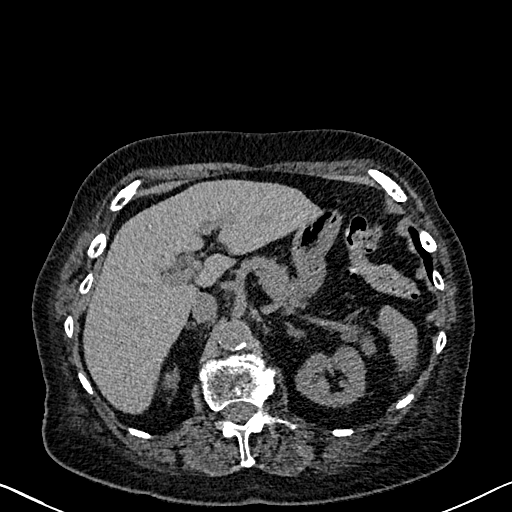
[im 12/159  lung]
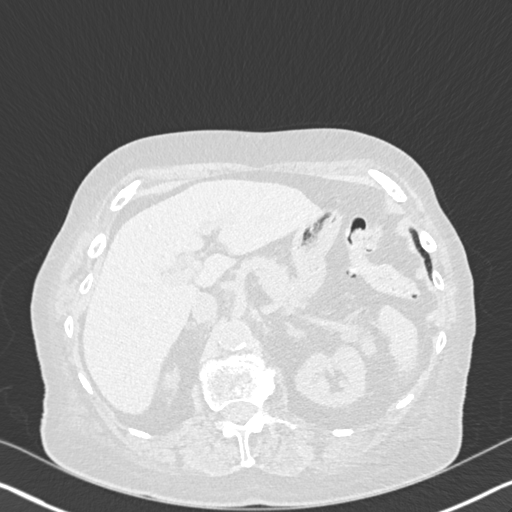
[im 24/159  lung]
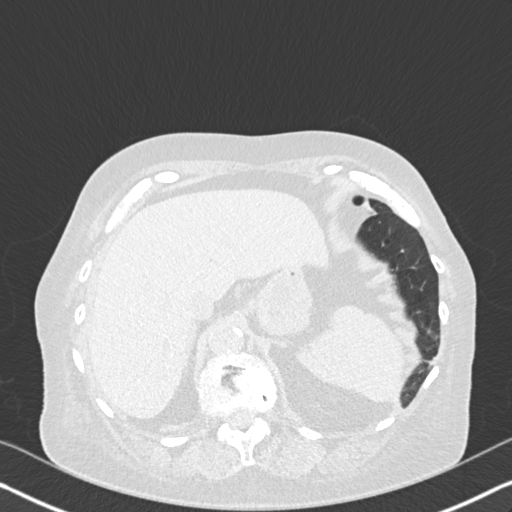
[im 36/159  lung]
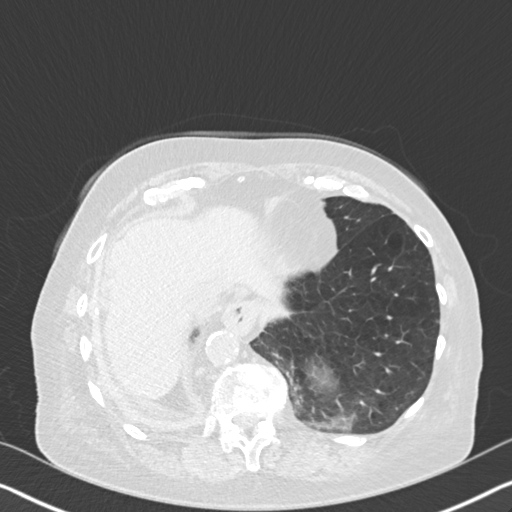
[im 47/159  lung]
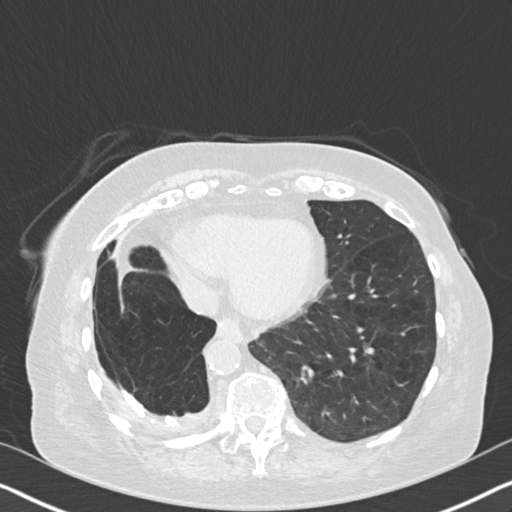
[im 59/159  mediastinal]
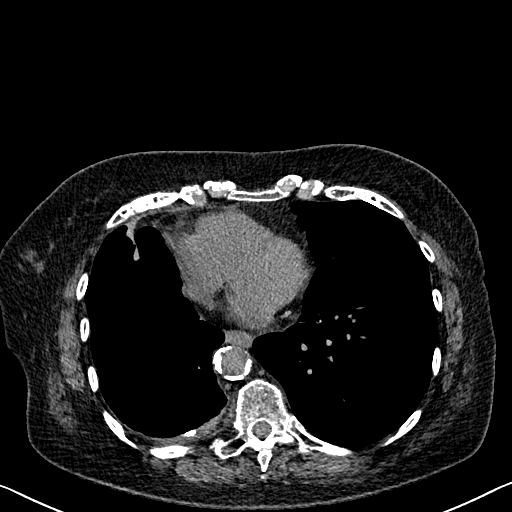
[im 59/159  lung]
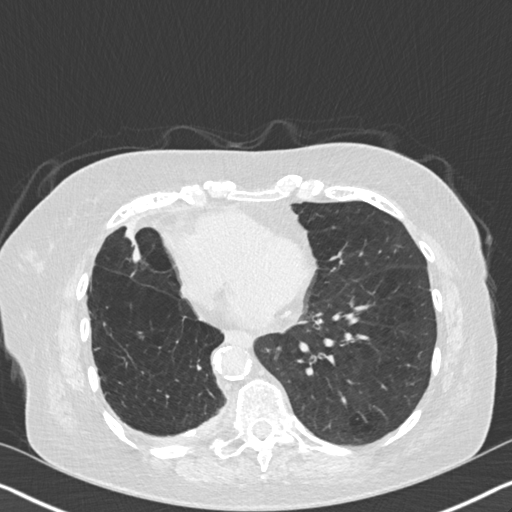
[im 71/159  lung]
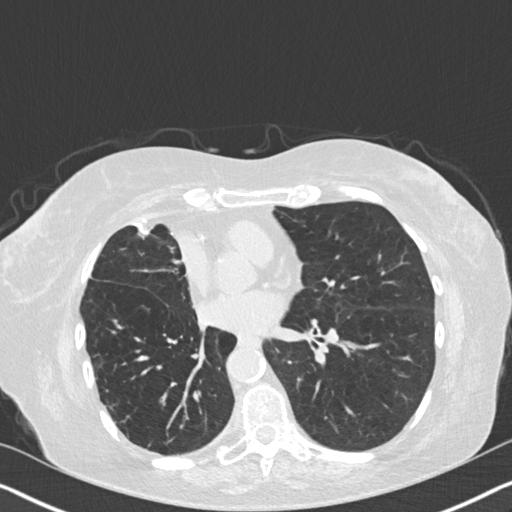
[im 88/159  lung]
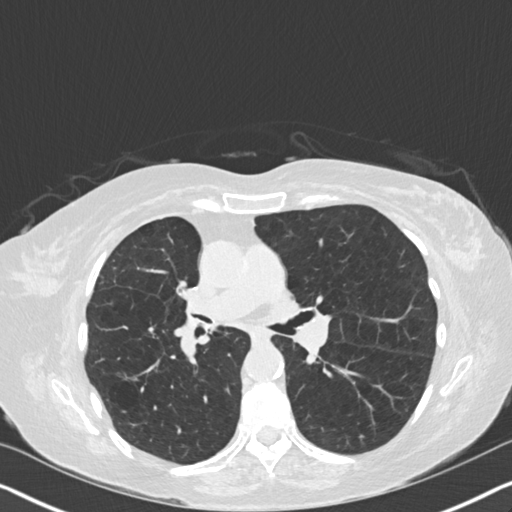
[im 100/159  lung]
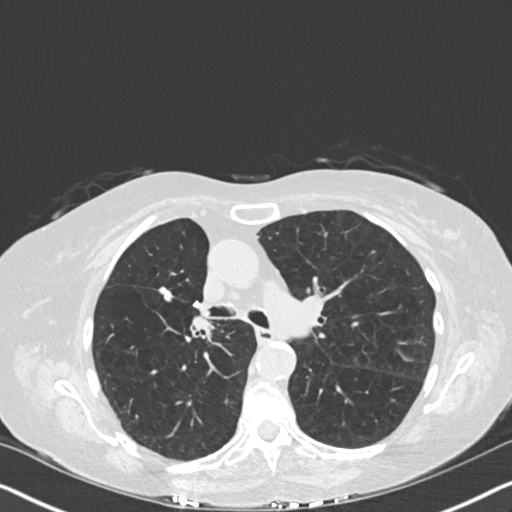
[im 112/159  mediastinal]
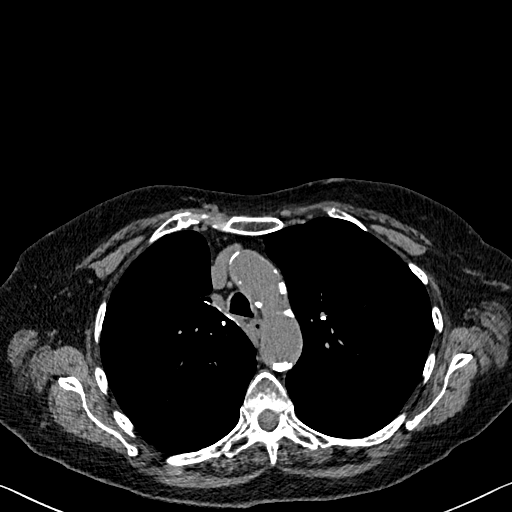
[im 112/159  lung]
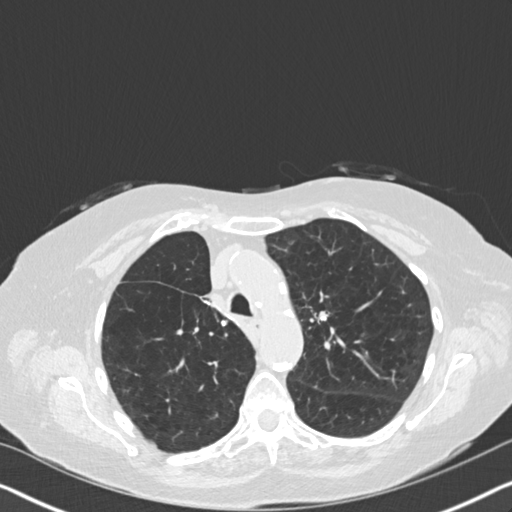
[im 123/159  lung]
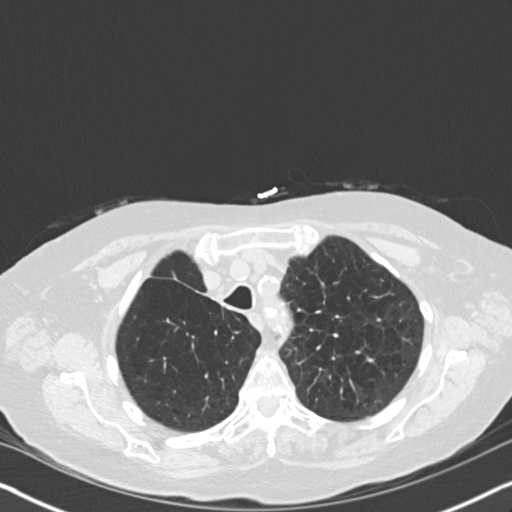
[im 135/159  lung]
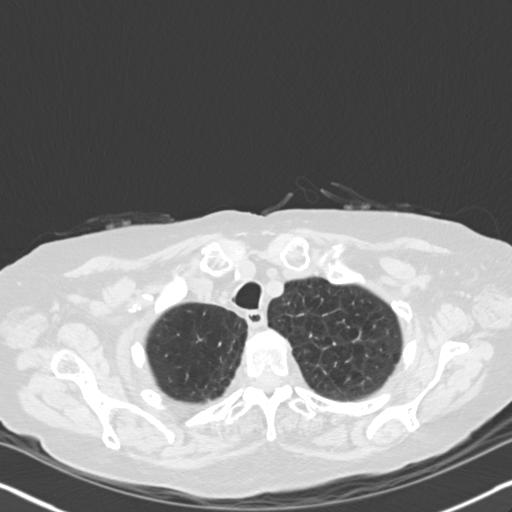
[im 147/159  lung]
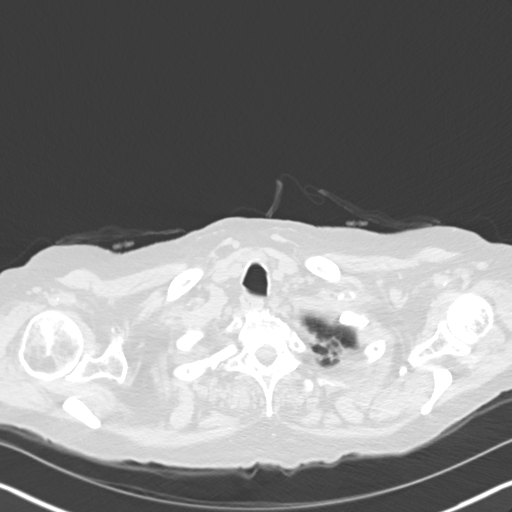

[Series 5: coronal · coronal · 0.62mm/px · 3 of 118 slices shown]
[im 24/118  lung]
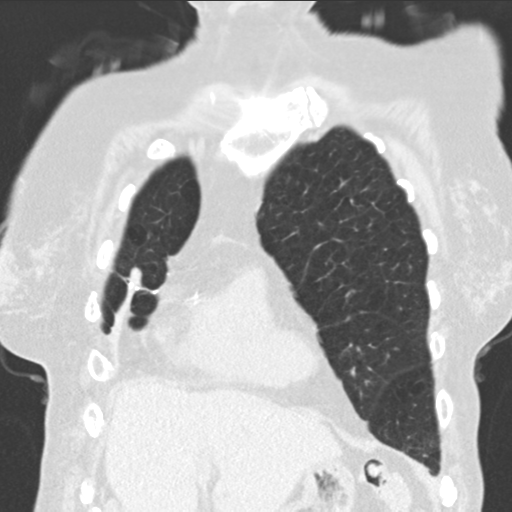
[im 47/118  lung]
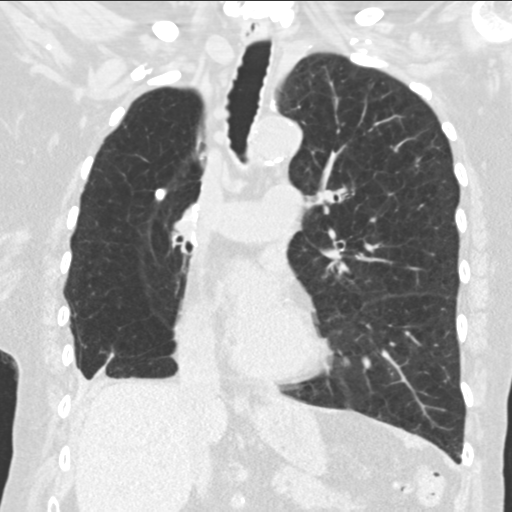
[im 71/118  lung]
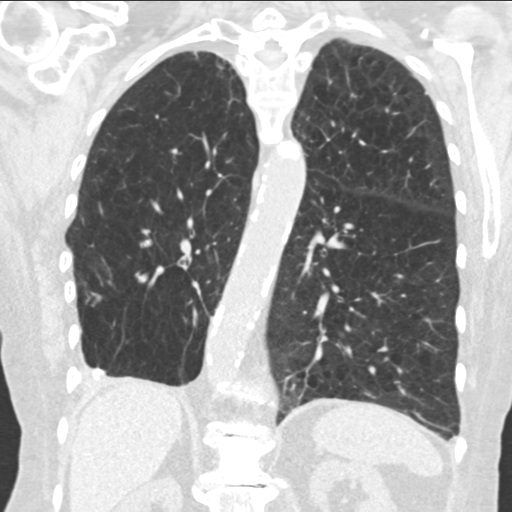

[15 of 36 positions shown; findings below may reference images not displayed]

FINDINGS: Cardiovascular: The heart is normal in size. No pericardial
effusion. There is tortuosity, ectasia and dense calcification of
the thoracic aorta. No focal aneurysm. Dense branch vessel
calcifications are also noted along with three-vessel coronary
artery calcifications.

Mediastinum/Nodes: No mediastinal or hilar mass or lymphadenopathy.
Small scattered lymph nodes are noted. The esophagus is grossly
normal. There is a small hiatal hernia.

Lungs/Pleura: Stable postoperative changes involving the right lung
with loss of volume and scarring changes. Calcified nodular
densities noted along the major fissure which are stable. There are
also dense right-sided pleural calcifications and chronic pleural
thickening at the right lung base.

Stable advanced emphysematous changes and associated pulmonary
scarring. No worrisome pulmonary lesions to suggest neoplasm or
metastatic disease. No infiltrates, edema or pleural effusions.
Moderate bronchial wall thickening in the right lower lobe could be
focal bronchitis but no findings for bronchopneumonia.

Upper Abdomen: No significant upper abdominal findings. Advanced
atherosclerotic calcifications involving the abdominal aorta and
branch vessels.

Musculoskeletal: No significant bony findings. Stable degenerative
changes involving the spine.
IMPRESSION: 1. Stable postoperative changes involving the right hemithorax. No
findings for recurrent tumor or metastatic pulmonary disease.
2. No mediastinal or hilar mass or lymphadenopathy.
3. Stable emphysematous changes and pulmonary scarring.
4. Right lower lobe bronchial wall thickening without
bronchopneumonia. No obvious endobronchial lesion. Mucoid impaction
is possible.
5. Stable pleural thickening and calcifications at right lung base.

Aortic Atherosclerosis (572LB-P25.5) and Emphysema (572LB-OSM.4).

## 2018-09-30 DIAGNOSIS — J449 Chronic obstructive pulmonary disease, unspecified: Secondary | ICD-10-CM | POA: Diagnosis not present

## 2018-10-12 ENCOUNTER — Other Ambulatory Visit: Payer: Self-pay | Admitting: Internal Medicine

## 2018-10-15 NOTE — Telephone Encounter (Signed)
[place] Controlled Database Checked Last filled: 09/11/18 # 30 LOV w/you: 05/08/18 Next appt w/you: 11/20/18

## 2018-10-16 DIAGNOSIS — J449 Chronic obstructive pulmonary disease, unspecified: Secondary | ICD-10-CM | POA: Diagnosis not present

## 2018-10-16 DIAGNOSIS — J9611 Chronic respiratory failure with hypoxia: Secondary | ICD-10-CM | POA: Diagnosis not present

## 2018-10-30 DIAGNOSIS — J449 Chronic obstructive pulmonary disease, unspecified: Secondary | ICD-10-CM | POA: Diagnosis not present

## 2018-11-13 ENCOUNTER — Other Ambulatory Visit: Payer: Self-pay | Admitting: Internal Medicine

## 2018-11-15 ENCOUNTER — Ambulatory Visit: Payer: PPO | Admitting: Internal Medicine

## 2018-11-15 DIAGNOSIS — J9611 Chronic respiratory failure with hypoxia: Secondary | ICD-10-CM | POA: Diagnosis not present

## 2018-11-15 DIAGNOSIS — J449 Chronic obstructive pulmonary disease, unspecified: Secondary | ICD-10-CM | POA: Diagnosis not present

## 2018-11-19 NOTE — Progress Notes (Signed)
Virtual Visit via Video Note  I connected with [name] on 11/20/18 at  1:30 PM EDT by a video enabled telemedicine application and verified that I am speaking with the correct person using two identifiers.   I discussed the limitations of evaluation and management by telemedicine and the availability of in person appointments. The patient expressed understanding and agreed to proceed.  The patient is currently at home and I am in the office.  Her daughter is present with her.  No referring provider.    History of Present Illness: She is here for follow up of her chronic medical conditions.   She is not exercising regularly.   Decreased appetite, losing weight:   She states that she has decreased appetite and she feels this is related to not doing as much or moving around as much.  As a result she has lost weight.  She does not weigh herself regularly at home, but knew she was losing weight.  The other day she did weigh herself at 125 pounds.  The last weight we have for her was 142.  COPD, chronic respiratory failure with hypoxia on oxygen:  She follows with pulmonary.  She sees Dr Annamaria Boots in [place].   She feels her breathing has been fairly stable because she has not been around anyone and has not had any URIs.  Hypothyroidism:  She is taking her medication daily.  She denies any recent changes in energy, but her weight has decreased as stated above.  Last time her thyroid checked it was within normal limits.  Hyperlipidemia: She is taking her medication daily. She is compliant with a low fat/cholesterol diet. She denies myalgias.   Anxiety, depression: She is taking her medication daily at nighttime as prescribed. She denies any side effects from the medication.  She does state anxiety at times.  She also states that she probably has some depression.  She states that has been a rough winter and they know they just lost some close friends.  She has never been on an SSRI, but  would consider it.  OA:  She takes tylenol or ibuprofen prn during the day, gabapentin at night .   Her knee is worse.  Her entire right leg hurts.  The pain does make it difficult getting around.  She is using her Rollator.  Her right foot swells.  This is new.  She has not had any swelling in her left foot.  Her legs are not swelling.  The swelling goes down a little over night.  There is no pain.   Recurrent falls:  She uses her rollator.  She has had a couple of falls since I saw her last with only mild injuries.  Sits down to urinate - but sometimes has difficulty going but will eventually go.  She needs to drink a lot of water-that helps.  She denies any dysuria or change in her urine urine color.  She denies any urine odor.   Review of Systems  Constitutional: Positive for weight loss. Negative for chills and fever.       Decreased appetite - more sedentary so she feels that accounts for her weight loss  Respiratory: Positive for cough (after she uses inhaler/nebs), shortness of breath (with exertion - 3 L of oxygen via [place]) and wheezing (occasional).   Cardiovascular: Positive for leg swelling (right foot).  Gastrointestinal: Positive for abdominal pain (occ with stress or if she eats certain things) and nausea (occ). Negative for constipation  and diarrhea.  Genitourinary: Negative for dysuria.  Neurological: Negative for dizziness and headaches.  Psychiatric/Behavioral: Positive for depression. The patient is nervous/anxious.      Social History   Socioeconomic History  . Marital status: Married    Spouse name: Not on file  . Number of children: 2  . Years of education: 26  . Highest education level: High school graduate  Occupational History  . Occupation: Retired Animator, Psychiatrist: RETIRED  Social Needs  . Financial resource strain: Not on file  . Food insecurity:    Worry: Not on file    Inability: Not on file  . Transportation needs:     Medical: Not on file    Non-medical: Not on file  Tobacco Use  . Smoking status: Former Smoker    Packs/day: 1.00    Years: 42.00    Pack years: 42.00    Types: Cigarettes    Last attempt to quit: 09/22/2003    Years since quitting: 15.1  . Smokeless tobacco: Never Used  Substance and Sexual Activity  . Alcohol use: Yes    Alcohol/week: 14.0 standard drinks    Types: 14 Glasses of wine per week    Comment: 12/22/2014 "I have a large glass of wine qd"  . Drug use: No  . Sexual activity: Never  Lifestyle  . Physical activity:    Days per week: Not on file    Minutes per session: Not on file  . Stress: Not on file  Relationships  . Social connections:    Talks on phone: Not on file    Gets together: Not on file    Attends religious service: Not on file    Active member of club or organization: Not on file    Attends meetings of clubs or organizations: Not on file    Relationship status: Not on file  Other Topics Concern  . Not on file  Social History Narrative   Lives with husband in a one story home.  Has 2 children.  Retired.  Education: high school.      Observations/Objective: Appears well in NAD Breathing within normal limits-wearing her oxygen Mood and affect within normal limits No lower extremity swelling-right foot slightly swollen  Assessment and Plan:  See Problem List for Assessment and Plan of chronic medical problems.   Follow Up Instructions:    I discussed the assessment and treatment plan with the patient. The patient was provided an opportunity to ask questions and all were answered. The patient agreed with the plan and demonstrated an understanding of the instructions.   The patient was advised to call back or seek an in-person evaluation if the symptoms worsen or if the condition fails to improve as anticipated.  Follow-up in 3 months to see how she is doing on Lexapro-prescribed for depression and anxiety   Binnie Rail, MD

## 2018-11-20 ENCOUNTER — Ambulatory Visit (INDEPENDENT_AMBULATORY_CARE_PROVIDER_SITE_OTHER): Payer: PPO | Admitting: Internal Medicine

## 2018-11-20 ENCOUNTER — Encounter: Payer: Self-pay | Admitting: Internal Medicine

## 2018-11-20 DIAGNOSIS — E785 Hyperlipidemia, unspecified: Secondary | ICD-10-CM | POA: Diagnosis not present

## 2018-11-20 DIAGNOSIS — E038 Other specified hypothyroidism: Secondary | ICD-10-CM

## 2018-11-20 DIAGNOSIS — M25559 Pain in unspecified hip: Secondary | ICD-10-CM

## 2018-11-20 DIAGNOSIS — R634 Abnormal weight loss: Secondary | ICD-10-CM

## 2018-11-20 DIAGNOSIS — F32A Depression, unspecified: Secondary | ICD-10-CM | POA: Insufficient documentation

## 2018-11-20 DIAGNOSIS — F329 Major depressive disorder, single episode, unspecified: Secondary | ICD-10-CM | POA: Insufficient documentation

## 2018-11-20 DIAGNOSIS — F3289 Other specified depressive episodes: Secondary | ICD-10-CM

## 2018-11-20 DIAGNOSIS — F419 Anxiety disorder, unspecified: Secondary | ICD-10-CM

## 2018-11-20 DIAGNOSIS — R296 Repeated falls: Secondary | ICD-10-CM

## 2018-11-20 MED ORDER — ESCITALOPRAM OXALATE 5 MG PO TABS: 5.0000 mg | ORAL_TABLET | Freq: Every day | ORAL | 5 refills | Status: DC

## 2018-11-20 NOTE — Assessment & Plan Note (Signed)
He is experiencing some depression along with increased anxiety Will start Lexapro 5 mg daily She will let me know if she has any side effects We will follow-up virtually in 3 weeks

## 2018-11-20 NOTE — Assessment & Plan Note (Signed)
Following with orthopedics Related to arthritis Takes Tylenol or ibuprofen as needed Gabapentin at night Continue

## 2018-11-20 NOTE — Assessment & Plan Note (Signed)
Using her Rollator at home Continues to have occasional fall with mild injuries

## 2018-11-20 NOTE — Assessment & Plan Note (Signed)
Has had some significant weight loss in the past few months She feels this is because she is not as active and her appetite is decreased and she has been consuming less Advised that she needs to start monitoring her weight closely at home Follow-up in 3 weeks virtually Encouraged good nutrition-try drinking Ensure regularly-she needs to find a lactose-free 1 If weight loss persists may need to have blood work including thyroid tested

## 2018-11-20 NOTE — Assessment & Plan Note (Signed)
Taking Xanax nightly Admits that her anxiety is probably not controlled Wonders about increasing Xanax to twice daily, also having some depression Advised that we do not want to increase the Xanax and we should start a daily medication Start Lexapro 5 mg daily Continue Xanax at night Follow-up in 3 weeks, sooner if needed

## 2018-11-20 NOTE — Assessment & Plan Note (Addendum)
Decreased appetite which she feels is from not being as active She admits to having some depression anxiety, which could be contributing Weighs 125 at home, she may have lost about 15-18 lbs in the past few months She will start weighing herself regularly She will work on eating a good nutritious food and trying to drink Ensure-needs to find one that is lactose-free We will follow-up in 3 weeks We will be starting Lexapro 5 mg daily to help with some underlying depression and anxiety if that is contributing to weight loss hopefully that will help

## 2018-11-20 NOTE — Assessment & Plan Note (Signed)
Continue statin Given coronavirus situation we will hold off on checking blood work at this time

## 2018-11-30 DIAGNOSIS — J449 Chronic obstructive pulmonary disease, unspecified: Secondary | ICD-10-CM | POA: Diagnosis not present

## 2018-12-05 ENCOUNTER — Telehealth: Payer: Self-pay | Admitting: Internal Medicine

## 2018-12-05 ENCOUNTER — Other Ambulatory Visit: Payer: Self-pay | Admitting: Internal Medicine

## 2018-12-05 MED ORDER — FLOVENT HFA 110 MCG/ACT IN AERO: INHALATION_SPRAY | RESPIRATORY_TRACT | 3 refills | Status: DC

## 2018-12-05 NOTE — Telephone Encounter (Signed)
LMTCB Returned call to [name]/spouse   She is probably near her baseline despite her problems over the past several months.  Vest is not helping so we will discontinue that for now. Plan-continue present meds.  Flu vaccine.    Assessment & Plan Note by Deneise Lever, MD at 05/30/2018 1

## 2018-12-05 NOTE — Telephone Encounter (Signed)
Spoke with patient daughter whose on contact Notified rx being sent. She verified pharmacy [facility] [address]. Nothing further needed.

## 2018-12-05 NOTE — Telephone Encounter (Signed)
Pt aware she is not out of meds. Pt c/o of effect of inhaler when she gets down to last few puffs of inhaler. Suggested running under warm water then dry before next use. Sometimes jets get clogged. Pt verbalized understanding Also informed patient that she can call back tomorrow if pharmacy not able to get meds in and we can sent to another pharmacy.

## 2018-12-13 ENCOUNTER — Other Ambulatory Visit: Payer: Self-pay | Admitting: Internal Medicine

## 2018-12-13 NOTE — Telephone Encounter (Signed)
Last OV 11/20/18 Last RF 11/13/18 Next OV

## 2018-12-16 DIAGNOSIS — J9611 Chronic respiratory failure with hypoxia: Secondary | ICD-10-CM | POA: Diagnosis not present

## 2018-12-16 DIAGNOSIS — J449 Chronic obstructive pulmonary disease, unspecified: Secondary | ICD-10-CM | POA: Diagnosis not present

## 2018-12-26 IMAGING — DX DG CHEST 2V
2 series · 2 of 2 positions shown · non-contrast
Comparison: Chest CT 02/28/2017 and radiographs 12/26/2016

CLINICAL DATA: Right-sided chest wall pain for 2 days. Congestion.
Wheezing. Chronic respiratory failure. History of lung cancer.

EXAM:
CHEST  2 VIEW

[chest pa]
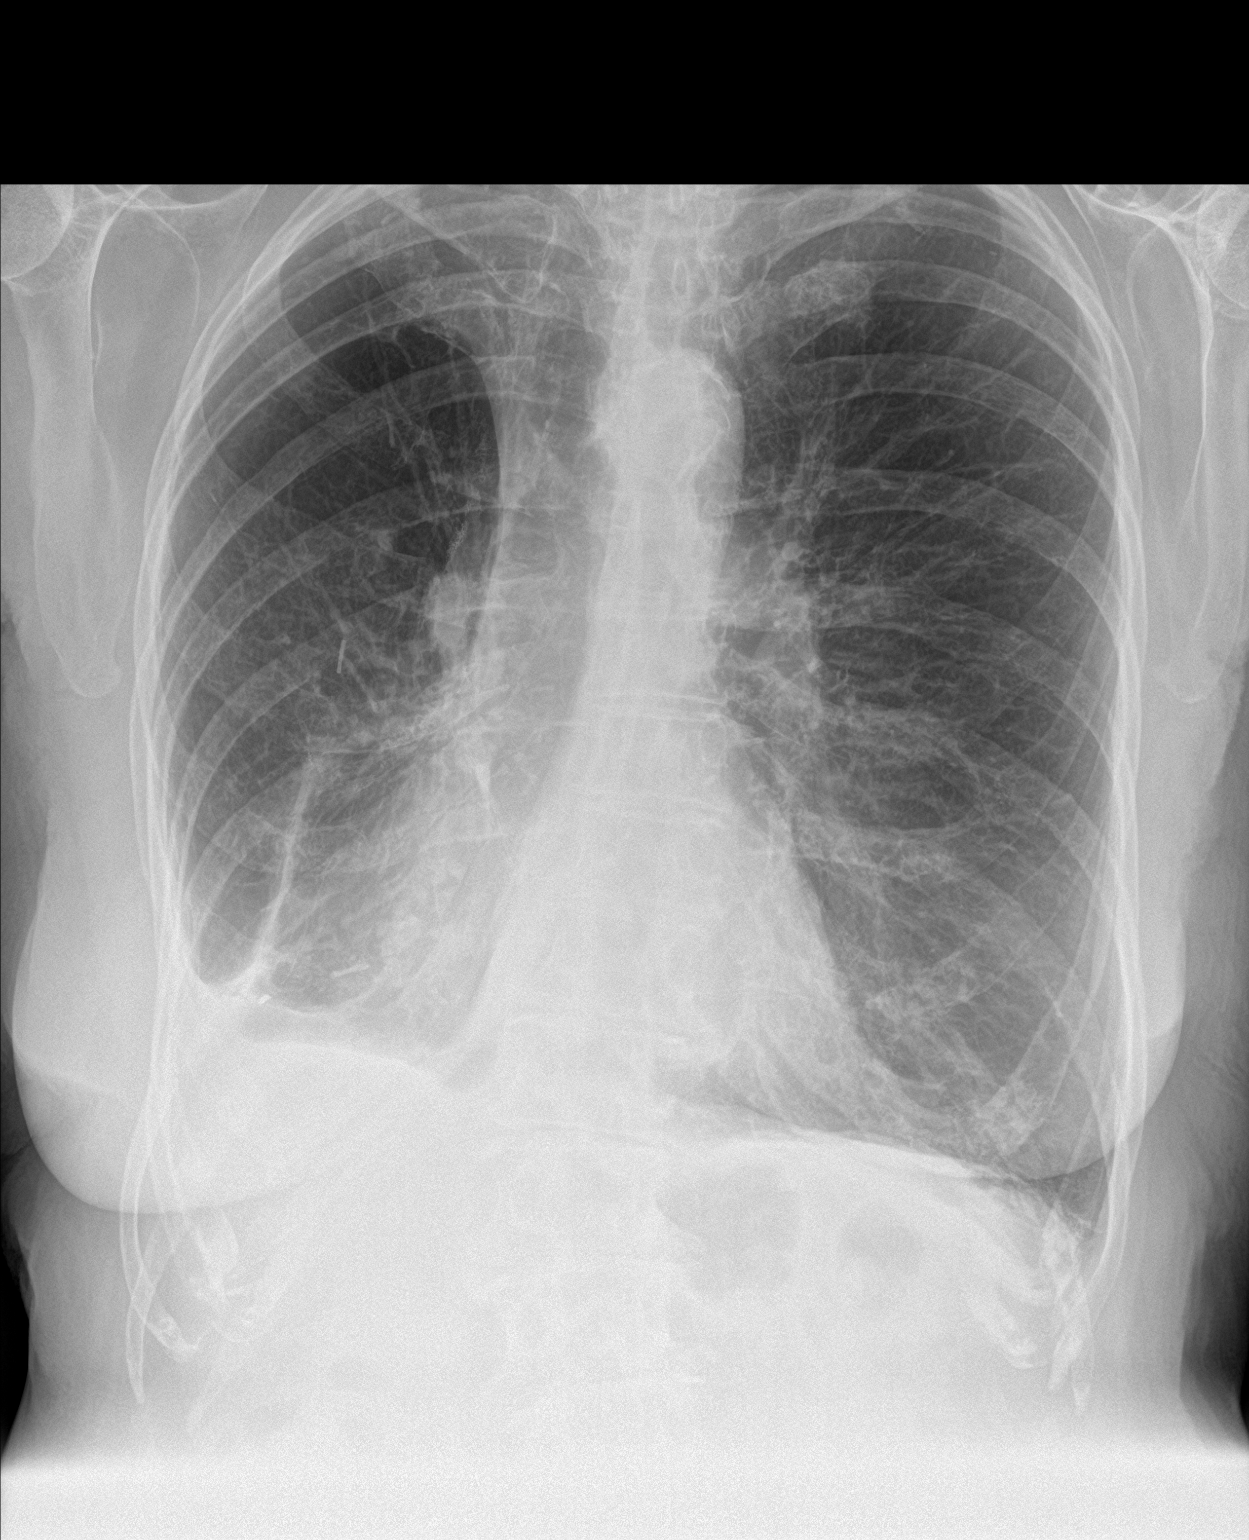

[chest lat]
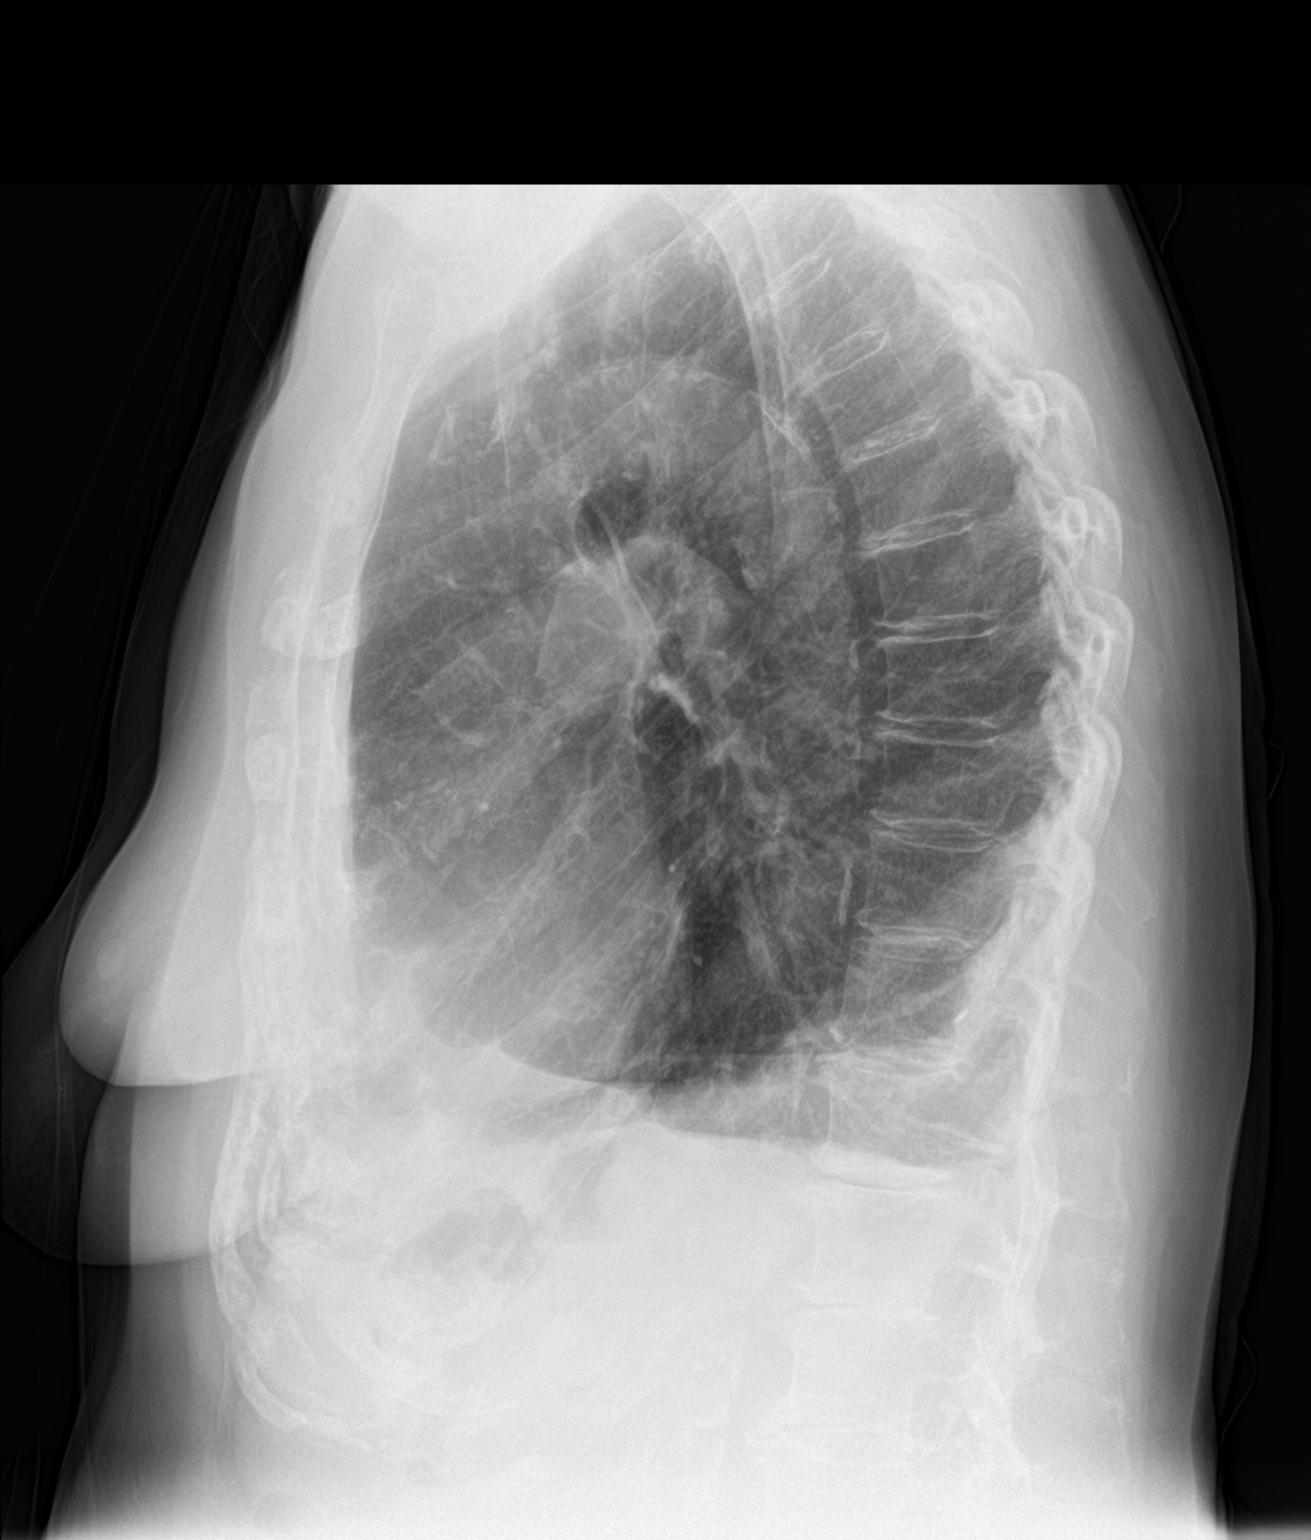

[2 of 2 positions shown; findings below may reference images not displayed]

FINDINGS: The cardiomediastinal silhouette is unchanged. Heart is normal in
size. Aortic atherosclerosis is noted. Postsurgical changes are
again seen in the right hemithorax with volume loss, scarring, and
pleural thickening at the lung base. There is background lung
hyperinflation with emphysema. The appearance of the lungs is
unchanged from the prior radiographs, and there is no evidence of
acute airspace consolidation, sizeable pleural effusion, or
pneumothorax. No acute osseous abnormality is seen.
IMPRESSION: COPD and chronic postoperative changes without evidence of acute
abnormality.

## 2018-12-30 DIAGNOSIS — J449 Chronic obstructive pulmonary disease, unspecified: Secondary | ICD-10-CM | POA: Diagnosis not present

## 2019-01-02 ENCOUNTER — Other Ambulatory Visit: Payer: Self-pay | Admitting: Internal Medicine

## 2019-01-15 DIAGNOSIS — J449 Chronic obstructive pulmonary disease, unspecified: Secondary | ICD-10-CM | POA: Diagnosis not present

## 2019-01-15 DIAGNOSIS — J9611 Chronic respiratory failure with hypoxia: Secondary | ICD-10-CM | POA: Diagnosis not present

## 2019-01-22 IMAGING — CR DG CHEST 2V
2 series · 2 of 2 positions shown · non-contrast
Comparison: 06/02/2017

CLINICAL DATA: PT reports she normally wears 3LPM [HOSPITAL] but today she
became very short of breath when walking to bathroom and started
Vinz[MATTYA][place] chest pain. PT checked SPO2 and it read 71%.

EXAM:
CHEST  2 VIEW

[chest lat]
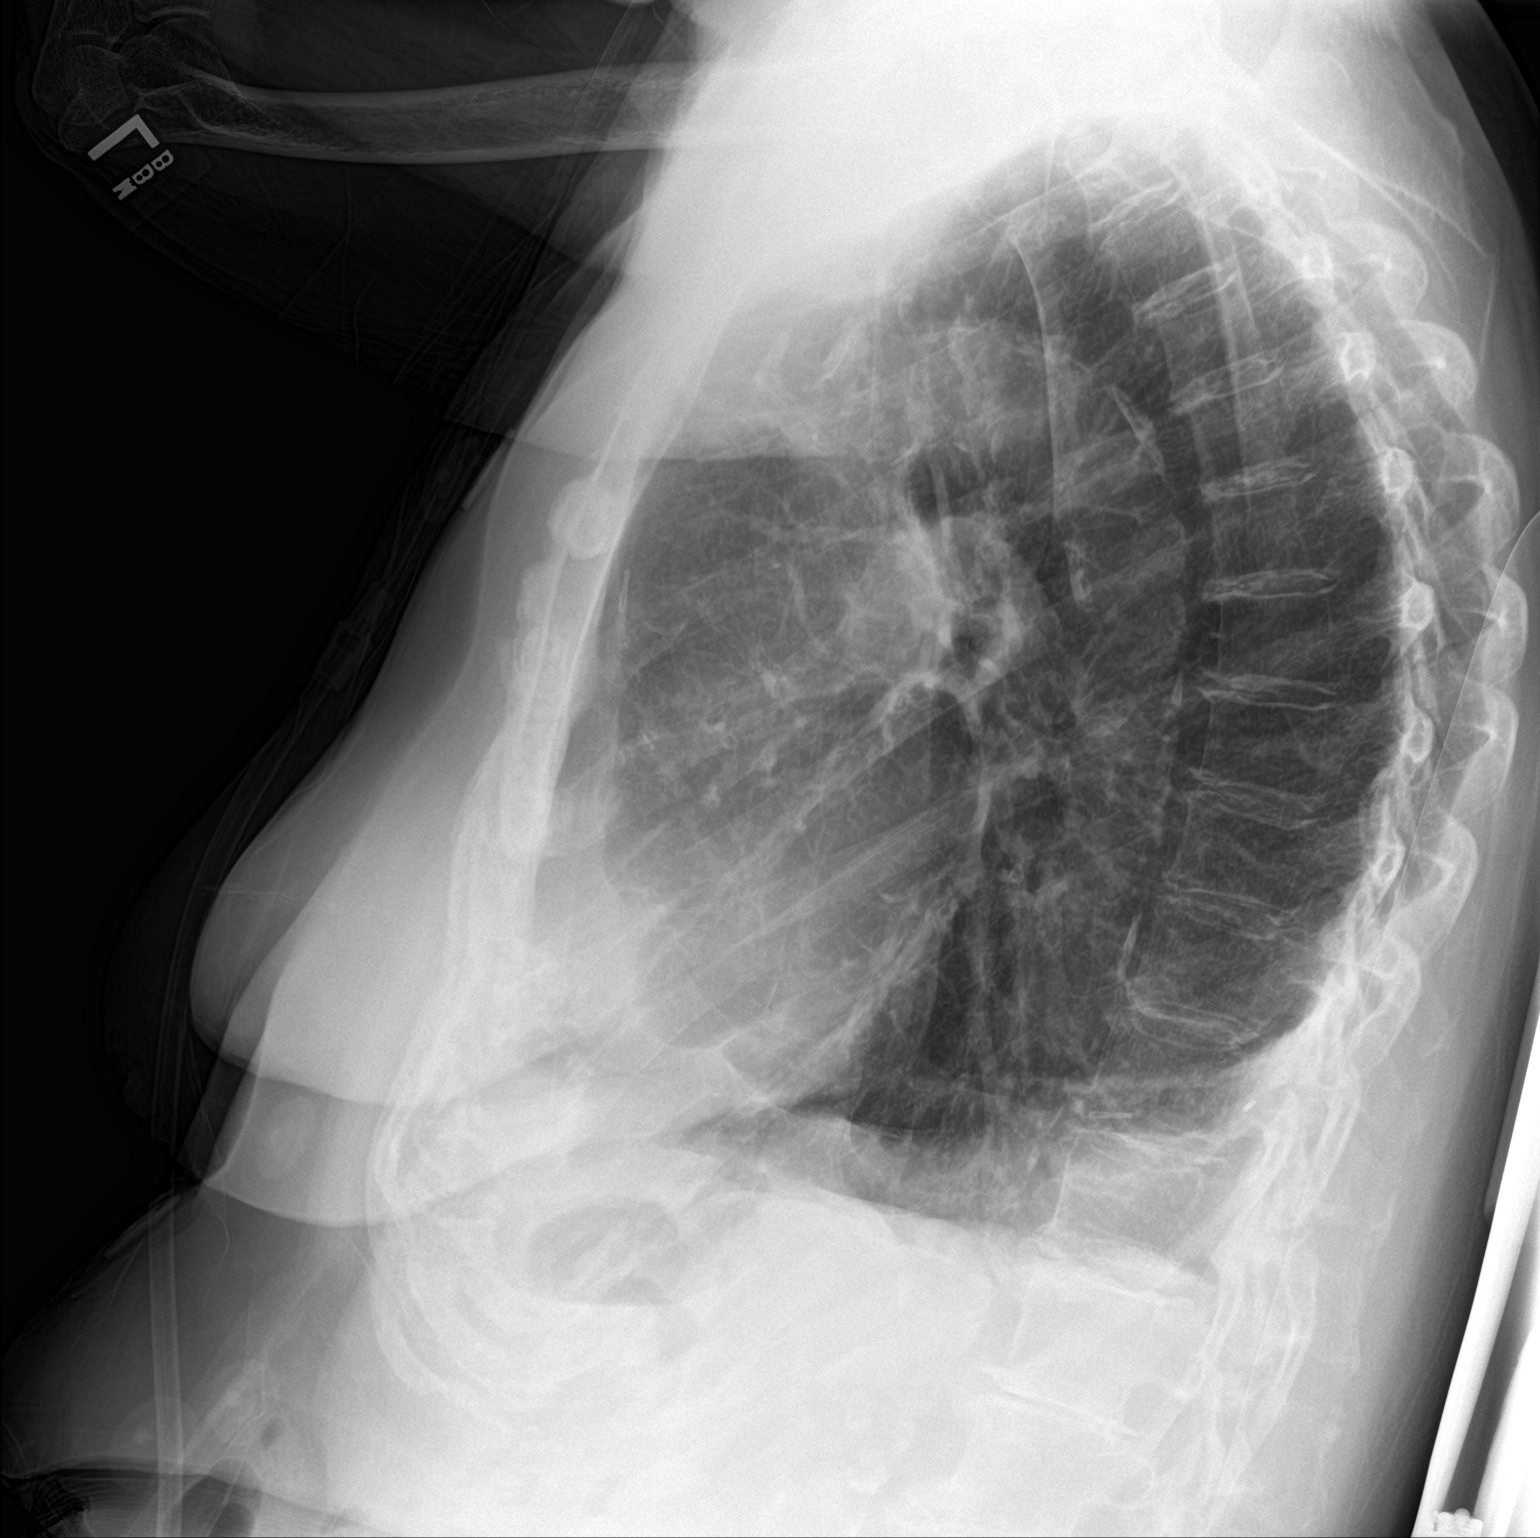

[chest ap]
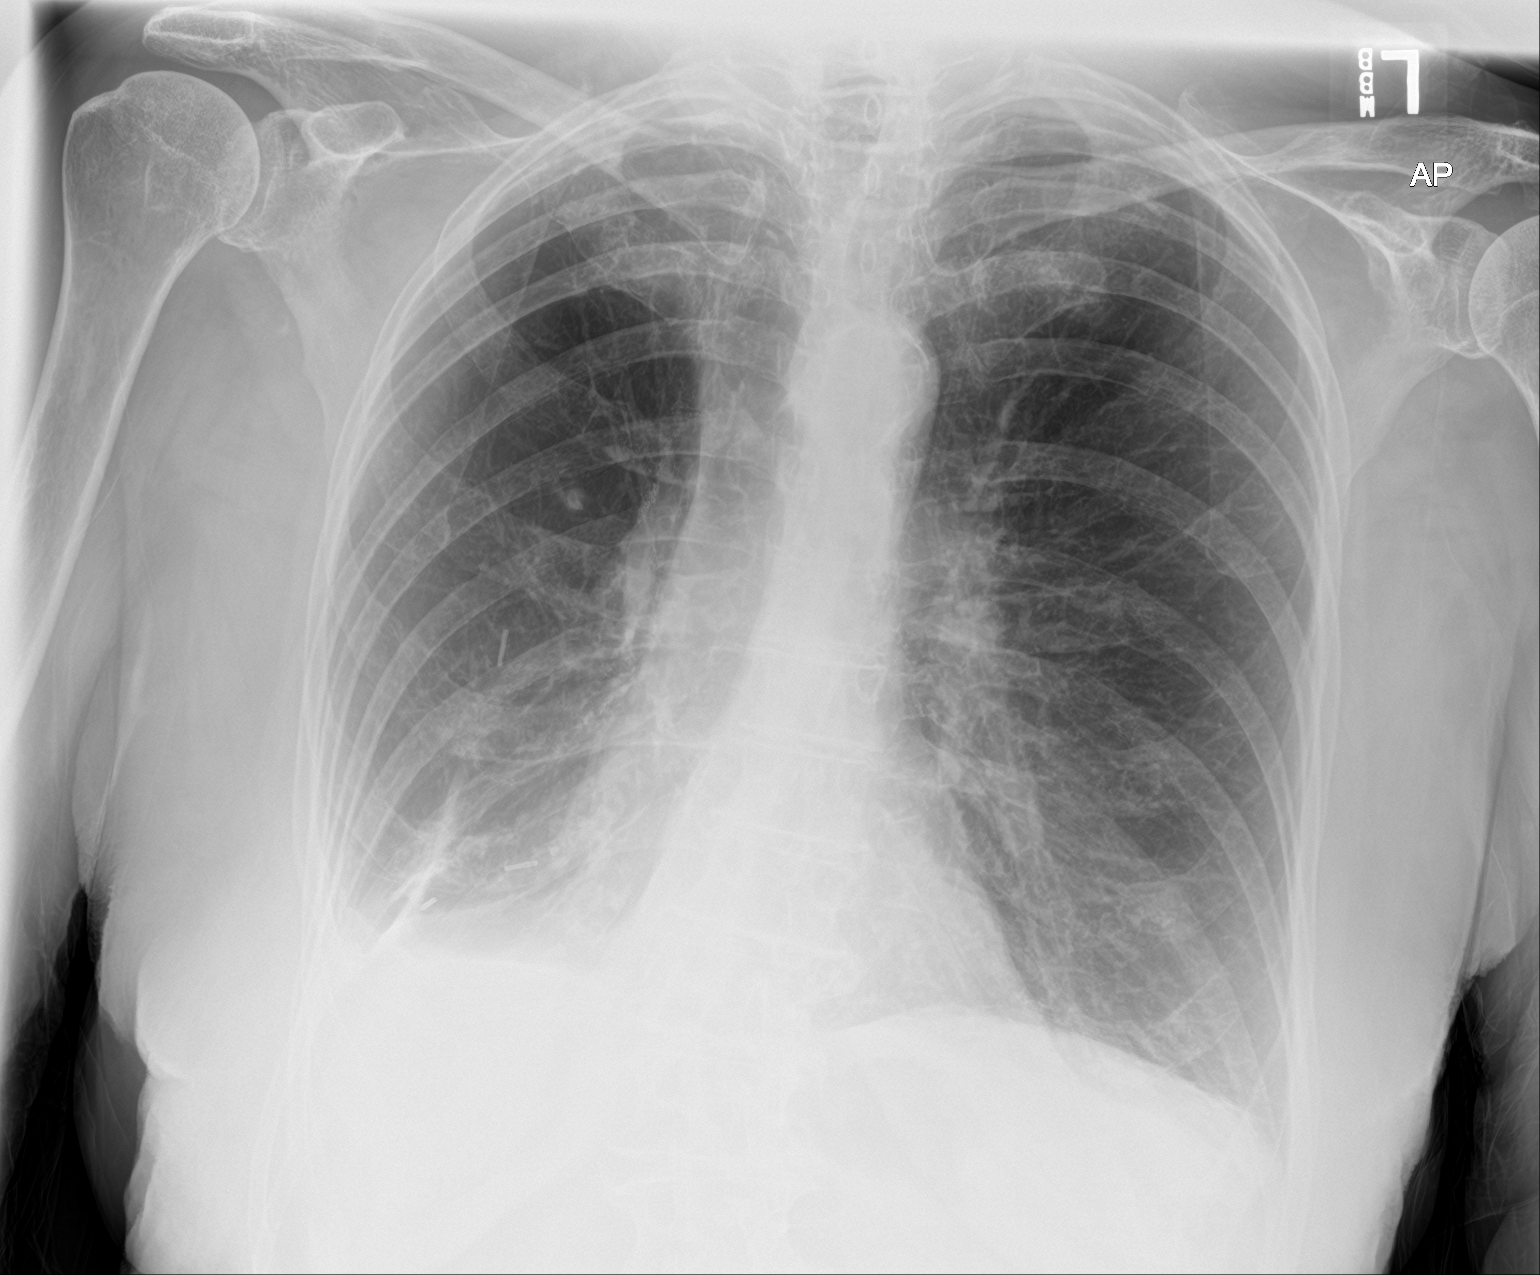

[2 of 2 positions shown; findings below may reference images not displayed]

FINDINGS: Lungs are hyperinflated. There is stable opacity at the right lung
base consistent with pleural effusion or pleural thickening and
scarring. No focal consolidations or pulmonary edema.
IMPRESSION: 1. Hyperinflation and stable right basilar changes.
2.  No evide[HOSPITAL]e for acute pulmonary abnormality.

## 2019-01-24 ENCOUNTER — Other Ambulatory Visit: Payer: Self-pay | Admitting: Internal Medicine

## 2019-01-30 DIAGNOSIS — J449 Chronic obstructive pulmonary disease, unspecified: Secondary | ICD-10-CM | POA: Diagnosis not present

## 2019-02-11 ENCOUNTER — Other Ambulatory Visit: Payer: Self-pay | Admitting: Internal Medicine

## 2019-02-11 NOTE — Telephone Encounter (Signed)
Check [place] registry last filled 7/25/202.Marland KitchenJohny Esparza

## 2019-02-15 ENCOUNTER — Telehealth: Payer: Self-pay

## 2019-02-15 DIAGNOSIS — J449 Chronic obstructive pulmonary disease, unspecified: Secondary | ICD-10-CM | POA: Diagnosis not present

## 2019-02-15 DIAGNOSIS — J9611 Chronic respiratory failure with hypoxia: Secondary | ICD-10-CM | POA: Diagnosis not present

## 2019-02-15 MED ORDER — MELOXICAM 7.5 MG PO TABS: 7.5000 mg | ORAL_TABLET | Freq: Every day | ORAL | 2 refills | Status: DC

## 2019-02-15 NOTE — Telephone Encounter (Signed)
Daughter aware and expressed understanding

## 2019-02-15 NOTE — Telephone Encounter (Signed)
Tylenol is the safest safest, but she is already taking it.  She can also try over-the-counter Voltaren gel, which is an anti-inflammatory topical gel, which may help.  She can take the meloxicam only as needed, but the less she takes it the better.

## 2019-02-15 NOTE — Telephone Encounter (Signed)
Daughter would like to know if there is something else that you would recommend that would be better for her.

## 2019-02-15 NOTE — Telephone Encounter (Signed)
Wants for arthritis. Not on current medication list.

## 2019-02-15 NOTE — Telephone Encounter (Signed)
Lets try the lower dose 7.5 mg daily prn since it can affect the stomach and is not good for the kidneys or heart.  Sent to [facility]

## 2019-02-15 NOTE — Telephone Encounter (Signed)
Copied from [place] [id number]. Topic: General - Other >> Feb 15, 2019  9:48 AM Parke Poisson wrote: Reason for CRM:Pt's daughter Ginger Organ is requesting that [facility]am be [id number] [address], [place]. [phone] (Phone) [phone] (Fax) for her arthritis

## 2019-02-19 ENCOUNTER — Ambulatory Visit: Payer: PPO | Admitting: Internal Medicine

## 2019-02-20 ENCOUNTER — Other Ambulatory Visit: Payer: Self-pay | Admitting: Internal Medicine

## 2019-02-26 ENCOUNTER — Ambulatory Visit (INDEPENDENT_AMBULATORY_CARE_PROVIDER_SITE_OTHER): Payer: PPO | Admitting: Primary Care

## 2019-02-26 ENCOUNTER — Ambulatory Visit (INDEPENDENT_AMBULATORY_CARE_PROVIDER_SITE_OTHER): Payer: PPO

## 2019-02-26 ENCOUNTER — Other Ambulatory Visit: Payer: Self-pay

## 2019-02-26 ENCOUNTER — Encounter: Payer: Self-pay | Admitting: Primary Care

## 2019-02-26 VITALS — BP 126/64 | HR 91 | Temp 96.7°F | Wt 142.0 lb

## 2019-02-26 DIAGNOSIS — R05 Cough: Secondary | ICD-10-CM | POA: Diagnosis not present

## 2019-02-26 DIAGNOSIS — J449 Chronic obstructive pulmonary disease, unspecified: Secondary | ICD-10-CM

## 2019-02-26 DIAGNOSIS — R059 Cough, unspecified: Secondary | ICD-10-CM

## 2019-02-26 DIAGNOSIS — M7989 Other specified soft tissue disorders: Secondary | ICD-10-CM | POA: Diagnosis not present

## 2019-02-26 DIAGNOSIS — R296 Repeated falls: Secondary | ICD-10-CM | POA: Diagnosis not present

## 2019-02-26 NOTE — Assessment & Plan Note (Addendum)
-   Stable - Continue Flovent twice daily; Xopenex nebulizers every 6 hours as needed for shortness of breath or wheezing - Continue flutter device 2-3 times a day for chest congestion - CXR today d/t cough - Received high dose flu vaccine today - Follow up 6 months with Dr. Annamaria Boots or sooner if needed

## 2019-02-26 NOTE — Patient Instructions (Signed)
Recommendations: Continue Flovent twice daily Continue Xopenex nebulizers every 6 hours as needed for shortness of breath or wheezing Continue flutter device 2-3 times a day for chest congestion *Elevate legs, consider compression stockings and follow-up with PCP *Please wear good footwear while indoors to prevent falls  Orders: Chest x-ray today-we will call with results  Office procedure: Received a high dose flu vaccine today  Follow-up: 6 months with Dr. Annamaria Boots or sooner if needed

## 2019-02-26 NOTE — Progress Notes (Signed)
$'@Patient'J$  ID: [name], female    DOB: [birth year], 85 y.o.   MRN: [id number]  Chief Complaint  Patient presents with  . Follow-up    less coughing spells (clear) - SOB about the same o- on3L     Referring provider: Binnie Rail, MD  HPI: 85 year old female, former smoker quit 2005.  History significant for COPD mixed type, chronic respiratory failure with hypoxia, non-small cell lung cancer stage I (history of right upper lobe resection), seasonal allergic rhinitis, hypothyroidism, arthritis, osteopenia, bladder prolapse, glaucoma, recurrent falls, diverticulitis, anxiety/depression.  Patient of Dr. Annamaria Boots, last seen 05/03/2018.  Ambulatory O2 desaturation to 85% in 2017, requiring 2-3 L oxygen via nasal cannula ([facility]).  Maintained on Flovent HFA, Xopenex nebulizers and flutter device.  02/26/2019 Patient presents today for 62-monthfollow-up.  Accompanied by her daughter.  She is at her baseline in terms of breathing.  Continues using Flovent and Xopenex nebulizers twice daily.  She reports less cough and contributes this to her flutter valve.  She had a recent fall last week which EMS was called for.  She has new swelling in her feet and ankles.  Daughter states they are overdue for a visit with PCP and will be calling them.  She would like flu vaccine today.    Allergies  Allergen Reactions  . Ampicillin Diarrhea and Nausea Only    GI upset  . Azithromycin Diarrhea and Nausea And Vomiting  . Codeine Nausea And Vomiting and Other (See Comments)    Makes her "crazy"  . Daliresp [Roflumilast] Nausea Only  . Nitrofurantoin Diarrhea and Nausea Only  . Other Other (See Comments)    No nuts and seeds because of diverticulitis  . Penicillins Nausea And Vomiting, Swelling and Rash    Has patient had a PCN reaction causing immediate rash, facial/tongue/throat swelling, SOB or lightheadedness with hypotension: Yes Has patient had a PCN reaction causing severe rash involving mucus  membranes or skin necrosis: No Has patient had a PCN reaction that required hospitalization No Has patient had a PCN reaction occurring within the last 10 years: No No "cillins" If all of the above answers are "NO", then may proceed with Cephalosporin use.   . Sulfonamide Derivatives Diarrhea and Nausea And Vomiting  . Tiotropium Bromide Monohydrate     Urinary retention  . Doxycycline Nausea Only  . Fludrocortisone Acetate Itching  . Lactose Intolerance (Gi) Other (See Comments)    Gas and bloating   . Latex Itching and Rash  . Prednisone Other (See Comments)    Insomnia after 2 days    Immunization History  Administered Date(s) Administered  . Fluad Quad(high Dose 65+) 02/26/2019  . H1N1 05/28/2008  . Influenza Split 03/22/2011, 04/11/2012  . Influenza Whole 03/12/2008, 04/01/2009, 03/16/2010  . Influenza, High Dose Seasonal PF 04/10/2013, 04/30/2014, 04/29/2015, 04/08/2016, 05/03/2018  . Influenza,inj,Quad PF,6+ Mos 02/09/2017  . Pneumococcal Conjugate-13 11/04/2014  . Pneumococcal Polysaccharide-23 02/21/2006  . Td 11/17/2009    Past Medical History:  Diagnosis Date  . Allergic rhinitis   . Anxiety   . Arthritis    "back, hands; hips" (12/22/2014)  . Asthma   . Bladder atony   . Chronic bronchitis ([facility]   . COPD (chronic obstructive pulmonary disease) ([facility]   . Diarrhea   . Emphysema of lung ([facility]   . GERD (gastroesophageal reflux disease)    "w/spicey foods" (12/22/2014)  . Hyperlipidemia   . Non-small cell carcinoma of lung, stage 1 ([facility] 2005  1.4 cm Poorly differentiated Squamous cell RUL  T1N0 resected 09/22/2003  . On home oxygen therapy    "3L q night and prn during the day" (12/22/2014)  . Pelvic cyst   . Pneumonia    "this is the 3rd time that I can remember" (12/22/2014)  . Unspecified hypothyroidism     Tobacco History: Social History   Tobacco Use  Smoking Status Former Smoker  . Packs/day: 1.00  . Years: 42.00  . Pack years: 42.00  . Types:  Cigarettes  . Quit date: 09/22/2003  . Years since quitting: 15.4  Smokeless Tobacco Never Used   Counseling given: Not Answered   Outpatient Medications Prior to Visit  Medication Sig Dispense Refill  . acetaminophen (TYLENOL ARTHRITIS PAIN) 650 MG CR tablet Take 650 mg by mouth daily as needed for pain.     [name] ALPRAZolam (XANAX) 1 MG tablet TAKE 1 TABLET BY MOUTH EVERYDAY AT BEDTIME 30 tablet 1  . atorvastatin (LIPITOR) 20 MG tablet Take 1 tablet (20 mg total) by mouth daily at 6 PM. 30 tablet 5  . Cholecalciferol (VITAMIN D) 2000 UNITS tablet Take 1 tablet (2,000 Units total) by mouth daily. 30 tablet 11  . FLOVENT HFA 110 MCG/ACT inhaler TAKE 2 PUFFS BY MOUTH TWICE A DAY 12 Inhaler 0  . levalbuterol (XOPENEX) 1.25 MG/3ML nebulizer solution Take 1.25 mg by nebulization every 6 (six) hours as needed for wheezing. DX: Bronchiectasis 360 mL 3  . levothyroxine (SYNTHROID) 88 MCG tablet TAKE 1 TABLET BY MOUTH EVERY DAY 90 tablet 0  . meloxicam (MOBIC) 7.5 MG tablet Take 1 tablet (7.5 mg total) by mouth daily. 30 tablet 2  . Multiple Vitamins-Minerals (CENTRUM SILVER PO) Take 1 tablet by mouth daily.     . ondansetron (ZOFRAN ODT) 4 MG disintegrating tablet Take 1 tablet (4 mg total) by mouth every 8 (eight) hours as needed for nausea or vomiting. 30 tablet 0  . Probiotic Product (PROBIOTIC PO) Take 1 capsule by mouth daily.    [name] Respiratory Therapy Supplies (FLUTTER) DEVI Blow through 4 times per cycle and repeat 3 cycles per day 1 each 0  . Respiratory Therapy Supplies (NEBULIZER/TUBING/MOUTHPIECE) KIT As directed 3 each 12  . escitalopram (LEXAPRO) 5 MG tablet Take 1 tablet (5 mg total) by mouth daily. (Patient not taking: Reported on 02/26/2019) 30 tablet 5   No facility-administered medications prior to visit.     Review of Systems  Review of Systems  Constitutional: Negative.   HENT: Negative.   Respiratory: Negative for cough, shortness of breath and wheezing.   Cardiovascular:  Positive for leg swelling.    Physical Exam  BP 126/64 (BP Location: Right Arm, Patient Position: Sitting, Cuff Size: Normal)   Pulse 91   Temp (!) 96.7 F (35.9 C)   Wt 142 lb (64.4 kg) Comment: stated weight.  unable to stand due to recent fall.  SpO2 96% Comment: 3L O2  BMI 22.92 kg/m  Physical Exam Constitutional:      General: She is not in acute distress.    Appearance: Normal appearance. She is not toxic-appearing.     Comments: Elderly female, chronically ill-appearing  HENT:     Head: Normocephalic and atraumatic.  Neck:     Musculoskeletal: Normal range of motion and neck supple.  Cardiovascular:     Rate and Rhythm: Normal rate and regular rhythm.     Comments: Regular; heart rate 91 Pulmonary:     Effort: Pulmonary effort is normal.  No respiratory distress.     Breath sounds: Normal breath sounds. No wheezing.  Musculoskeletal: Normal range of motion.  Skin:    General: Skin is warm and dry.     Findings: Bruising present.     Comments: Scattered bruising to forearms and lower extremities   Neurological:     General: No focal deficit present.     Mental Status: She is alert and oriented to person, place, and time. Mental status is at baseline.  Psychiatric:        Mood and Affect: Mood normal.        Behavior: Behavior normal.        Thought Content: Thought content normal.        Judgment: Judgment normal.      Lab Results:  CBC    Component Value Date/Time   WBC 6.9 05/08/2018 1432   RBC 3.55 (L) 05/08/2018 1432   HGB 11.9 (L) 05/08/2018 1432   HGB 12.7 01/06/2012 1324   HCT 35.3 (L) 05/08/2018 1432   HCT 38.6 01/06/2012 1324   PLT 228.0 05/08/2018 1432   PLT 221 01/06/2012 1324   MCV 99.6 05/08/2018 1432   MCV 97.6 01/06/2012 1324   MCH 31.9 06/29/2017 1600   MCHC 33.6 05/08/2018 1432   RDW 12.6 05/08/2018 1432   RDW 13.7 01/06/2012 1324   LYMPHSABS 1.7 05/08/2018 1432   LYMPHSABS 0.8 (L) 01/06/2012 1324   MONOABS 0.7 05/08/2018 1432    MONOABS 0.2 01/06/2012 1324   EOSABS 0.0 05/08/2018 1432   EOSABS 0.0 01/06/2012 1324   BASOSABS 0.0 05/08/2018 1432   BASOSABS 0.0 01/06/2012 1324    BMET    Component Value Date/Time   NA 134 (L) 05/08/2018 1432   NA 138 12/31/2012 0956   K 4.1 05/08/2018 1432   K 4.1 12/31/2012 0956   CL 94 (L) 05/08/2018 1432   CL 97 (L) 12/21/2010 1206   CO2 32 05/08/2018 1432   CO2 29 12/31/2012 0956   GLUCOSE 108 (H) 05/08/2018 1432   GLUCOSE 97 12/31/2012 0956   GLUCOSE 102 12/21/2010 1206   BUN 6 05/08/2018 1432   BUN 8.3 12/31/2012 0956   CREATININE 0.76 05/08/2018 1432   CREATININE 0.8 12/31/2012 0956   CALCIUM 9.9 05/08/2018 1432   CALCIUM 10.0 12/31/2012 0956   GFRNONAA >60 06/29/2017 1600   GFRAA >60 06/29/2017 1600    BNP No results found for: BNP  ProBNP No results found for: PROBNP  Imaging: No results found.   Assessment & Plan:   COPD mixed type - Stable - Continue Flovent twice daily; Xopenex nebulizers every 6 hours as needed for shortness of breath or wheezing - Continue flutter device 2-3 times a day for chest congestion - CXR today d/t cough - Received high dose flu vaccine today - Follow up 6 months with Dr. Annamaria Boots or sooner if needed   Recurrent falls - Encourage proper footwear indoors and using walker - Offered PT home eval, will follow-up with PCP  Bilateral swelling of feet - +1 pedal edema L>R - Encouraged leg elevation and compression stockings - Patient will follow-up with PCP    Martyn Ehrich, NP 02/26/2019

## 2019-02-26 NOTE — Assessment & Plan Note (Signed)
- +  1 pedal edema L>R - Encouraged leg elevation and compression stockings - Patient will follow-up with PCP

## 2019-02-26 NOTE — Assessment & Plan Note (Signed)
-   Encourage proper footwear indoors and using walker - Offered PT home eval, will follow-up with PCP

## 2019-02-27 NOTE — Progress Notes (Signed)
Please let patient know CXR showed no acute findings. Scarring at right base and emphysema- otherwise lungs clear.

## 2019-02-28 NOTE — Progress Notes (Signed)
Subjective:    Patient ID: [name], female    DOB: Sep 12, 1932, 85 y.o.   MRN: [id number]  HPI The patient is here for an acute visit.   Feet swelling:  Her feet have been swelling.  She denies lower leg swelling.  The feet feel tight and tender.  She can not wear any shoes. The right foot has been swollen for one year, but both feet have been swollen now for two weeks.    Most of the time her feet are down, but she will raise them a little, until her right knee hurts.  She is fairly compliant with a low-sodium diet.  She has been at home more since COVID-19 pandemic started, but no major changes in her activity level in the past 2 weeks.  She just recently starting the meloxicam, which is the only change in medication.  She denies increased shortness of breath and did see pulmonary yesterday and had a CXR which was normal.   Abrasion after fall:  She fell one week ago.  She fell trying to get to her bedside commode and was not able to get up.  She has several bruises and skin tears.  She is concerned about possible infection.  Her son has been doing wound care and has been using silver sulfadiazine.       Medications and allergies reviewed with patient and updated if appropriate.  Patient Active Problem List   Diagnosis Date Noted  . Weight loss 11/20/2018  . Depression 11/20/2018  . Recurrent falls 11/20/2018  . Parotiditis 09/17/2018  . Unilateral primary osteoarthritis, right hip 06/21/2018  . Right knee pain 05/08/2018  . Fall 02/28/2018  . Skin tear of left forearm without complication 40/34/7425  . Degenerative disc disease, cervical 12/06/2017  . DOE (dyspnea on exertion) 11/21/2017  . Fatigue 11/02/2017  . Bilateral swelling of feet 11/02/2017  . Right-sided chest pain 06/02/2017  . Osteopenia 02/16/2017  . Hyperglycemia 12/08/2016  . Degenerative arthritis of right knee 10/27/2016  . 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 Greater trochanteric bursitis of both hips 04/08/2016  . Pain in both knees 04/08/2016  . Chronic midline low back pain without sciatica 04/08/2016  . Dysuria 12/02/2015  . Hip pain 12/02/2015  . Chronic respiratory failure with hypoxia ([place]) 03/07/2015  . Diverticulitis 05/24/2014  . Anxiety   . Bladder prolapse, female, acquired   . Arthritis   . Non-small cell carcinoma of lung, stage 1 ([place]) 01/10/2012  . Neuropathy, peripheral 01/28/2011  . COPD mixed type ([place]) 09/08/2010  . Dyslipidemia   . ATONY OF BLADDER 08/10/2009  . Hypothyroidism 03/12/2008  . Seasonal allergic rhinitis 10/10/2007    Current Outpatient Medications on File Prior to Visit  Medication Sig Dispense Refill  . acetaminophen (TYLENOL ARTHRITIS PAIN) 650 MG CR tablet Take 650 mg by mouth daily as needed for pain.     [name] ALPRAZolam (XANAX) 1 MG tablet TAKE 1 TABLET BY MOUTH EVERYDAY AT BEDTIME 30 tablet 1  . atorvastatin (LIPITOR) 20 MG tablet Take 1 tablet (20 mg total) by mouth daily at 6 PM. 30 tablet 5  . Cholecalciferol (VITAMIN D) 2000 UNITS tablet Take 1 tablet (2,000 Units total) by mouth daily. 30 tablet 11  . FLOVENT HFA 110 MCG/ACT inhaler TAKE 2 PUFFS BY MOUTH TWICE A DAY 12 Inhaler 0  . levalbuterol (XOPENEX) 1.25 MG/3ML nebulizer solution Take 1.25 mg by nebulization every 6 (six) hours as  needed for wheezing. DX: Bronchiectasis 360 mL 3  . levothyroxine (SYNTHROID) 88 MCG tablet TAKE 1 TABLET BY MOUTH EVERY DAY 90 tablet 0  . meloxicam (MOBIC) 7.5 MG tablet Take 1 tablet (7.5 mg total) by mouth daily. 30 tablet 2  . Multiple Vitamins-Minerals (CENTRUM SILVER PO) Take 1 tablet by mouth daily.     . ondansetron (ZOFRAN ODT) 4 MG disintegrating tablet Take 1 tablet (4 mg total) by mouth every 8 (eight) hours as needed for nausea or vomiting. 30 tablet 0  . Probiotic Product (PROBIOTIC PO) Take 1 capsule by mouth daily.    [name] Respiratory Therapy Supplies (FLUTTER) DEVI Blow through 4 times per cycle  and repeat 3 cycles per day 1 each 0  . Respiratory Therapy Supplies (NEBULIZER/TUBING/MOUTHPIECE) KIT As directed 3 each 12   No current facility-administered medications on file prior to visit.     Past Medical History:  Diagnosis Date  . Allergic rhinitis   . Anxiety   . Arthritis    "back, hands; hips" (12/22/2014)  . Asthma   . Bladder atony   . Chronic bronchitis ([place])   . COPD (chronic obstructive pulmonary disease) ([place])   . Diarrhea   . Emphysema of lung ([place])   . GERD (gastroesophageal reflux disease)    "w/spicey foods" (12/22/2014)  . Hyperlipidemia   . Non-small cell carcinoma of lung, stage 1 ([place]) 2005   1.4 cm Poorly differentiated Squamous cell RUL  T1N0 resected 09/22/2003  . On home oxygen therapy    "3L q night and prn during the day" (12/22/2014)  . Pelvic cyst   . Pneumonia    "this is the 3rd time that I can remember" (12/22/2014)  . Unspecified hypothyroidism     Past Surgical History:  Procedure Laterality Date  . ABDOMINAL HYSTERECTOMY  ~ 1976  . BACK SURGERY    . CATARACT EXTRACTION W/ INTRAOCULAR LENS  IMPLANT, BILATERAL Bilateral ~ 2012  . DILATION AND CURETTAGE OF UTERUS    . LUMBAR DISC SURGERY  1970's  . LYMPH NODE DISSECTION  2005   "all my lymph nodes under breasts, went thru my back; Dr. Arlyce Dice did it"  . Needle aspiration Pelvic cyst  2011   Dr Diona Fanti  . VIDEO ASSISTED THORACOSCOPY (VATS)/THOROCOTOMY Right 09/2003   thoracotomy, right upper lobectomy with node dissection; Dr Demetrios Loll 11/02/2010  . VIDEO BRONCHOSCOPY  03/2004   Archie Endo 11/02/2010    Social History   Socioeconomic History  . Marital status: Married    Spouse name: Not on file  . Number of children: 2  . Years of education: 47  . Highest education level: High school graduate  Occupational History  . Occupation: Retired Animator, Psychiatrist: RETIRED  Social Needs  . Financial resource strain: Not on file  . Food insecurity    Worry: Not on  file    Inability: Not on file  . Transportation needs    Medical: Not on file    Non-medical: Not on file  Tobacco Use  . Smoking status: Former Smoker    Packs/day: 1.00    Years: 42.00    Pack years: 42.00    Types: Cigarettes    Quit date: 09/22/2003    Years since quitting: 15.4  . Smokeless tobacco: Never Used  Substance and Sexual Activity  . Alcohol use: Yes    Alcohol/week: 14.0 standard drinks    Types: 14 Glasses of wine per week  Comment: 12/22/2014 "I have a large glass of wine qd"  . Drug use: No  . Sexual activity: Never  Lifestyle  . Physical activity    Days per week: Not on file    Minutes per session: Not on file  . Stress: Not on file  Relationships  . Social Herbalist on phone: Not on file    Gets together: Not on file    Attends religious service: Not on file    Active member of club or organization: Not on file    Attends meetings of clubs or organizations: Not on file    Relationship status: Not on file  Other Topics Concern  . Not on file  Social History Narrative   Lives with husband in a one story home.  Has 2 children.  Retired.  Education: high school.     Family History  Problem Relation Age of Onset  . Heart disease Father   . Rheumatologic disease Brother   . Cancer Brother        Leukemia  . Rheumatologic disease Sister   . Breast cancer Daughter     Review of Systems  Constitutional: Negative for chills and fever.  Respiratory: Positive for shortness of breath (chronic but unchanged).   Cardiovascular: Positive for leg swelling. Negative for chest pain and palpitations.  Skin: Positive for wound.       Objective:   Vitals:   03/01/19 1402  BP: 138/64  Pulse: 98  Resp: 18  Temp: 99 F (37.2 C)  SpO2: 96%   BP Readings from Last 3 Encounters:  03/01/19 138/64  02/26/19 126/64  09/17/18 138/62   Wt Readings from Last 3 Encounters:  02/26/19 142 lb (64.4 kg)  06/21/18 142 lb (64.4 kg)  05/28/18 142 lb  (64.4 kg)   Body mass index is 22.92 kg/m.   Physical Exam Constitutional:      General: She is not in acute distress.    Appearance: Normal appearance. She is not ill-appearing.  HENT:     Head: Normocephalic and atraumatic.  Cardiovascular:     Rate and Rhythm: Normal rate and regular rhythm.     Comments: 1+ bilateral foot swelling.  No bilateral ankle swelling. Pulmonary:     Effort: Pulmonary effort is normal. No respiratory distress.     Breath sounds: No wheezing or rales.  Skin:    Findings: Bruising (arms and legs from fall) present.     Comments: Several wound/skin tears on b/l arms and legs.  I looked at the three worse wounds ( two on left lateral lower leg and one on left forearm)- all superficial skin tear with good skin granulation, silver sulfadiazine on top, no bleeding or pus, normal healing erythema around wounds   Neurological:     Mental Status: She is alert.         Left lateral prox lower leg.  Left distal upper arm    Assessment & Plan:    See Problem List for Assessment and Plan of chronic medical problems.

## 2019-03-01 ENCOUNTER — Encounter: Payer: Self-pay | Admitting: Internal Medicine

## 2019-03-01 ENCOUNTER — Ambulatory Visit (INDEPENDENT_AMBULATORY_CARE_PROVIDER_SITE_OTHER): Payer: PPO | Admitting: Internal Medicine

## 2019-03-01 ENCOUNTER — Other Ambulatory Visit: Payer: Self-pay

## 2019-03-01 DIAGNOSIS — T148XXA Other injury of unspecified body region, initial encounter: Secondary | ICD-10-CM

## 2019-03-01 DIAGNOSIS — M7989 Other specified soft tissue disorders: Secondary | ICD-10-CM

## 2019-03-01 MED ORDER — SILVER SULFADIAZINE 1 % EX CREA: 1.0000 "application " | TOPICAL_CREAM | Freq: Every day | CUTANEOUS | 0 refills | Status: DC

## 2019-03-01 NOTE — Patient Instructions (Addendum)
Stop the meloxicam.  Elevate your legs as much as possible.  Continue a low sodium diet.     For your wounds.  Continue local wound care.  Monitor them closely.      Please call if there is no improvement in your symptoms.

## 2019-03-02 DIAGNOSIS — T148XXA Other injury of unspecified body region, initial encounter: Secondary | ICD-10-CM | POA: Insufficient documentation

## 2019-03-02 DIAGNOSIS — J449 Chronic obstructive pulmonary disease, unspecified: Secondary | ICD-10-CM | POA: Diagnosis not present

## 2019-03-02 NOTE — Assessment & Plan Note (Signed)
She has multiple skin tears after recent fall I examined 2 on her left lower leg and one on her left forearm, which were the more severe skin tears Her son is doing wound care and using silver sulfadiazine No evidence of infection Continue local wound care-can continue silver sulfadiazine Her family will help monitor closely and call with any concerns

## 2019-03-02 NOTE — Assessment & Plan Note (Signed)
Bilateral foot swelling x2 weeks No increasing shortness of breath, chest x-ray clear-no evidence of heart failure Increase swelling likely related to meloxicam-she states this is not helping much and will discontinue it Continue to elevate legs as much as possible Continue low-sodium diet Discussed mild compression socks Call if no improvement

## 2019-03-06 ENCOUNTER — Other Ambulatory Visit: Payer: Self-pay | Admitting: Internal Medicine

## 2019-03-18 ENCOUNTER — Other Ambulatory Visit: Payer: Self-pay | Admitting: Internal Medicine

## 2019-03-18 DIAGNOSIS — J9611 Chronic respiratory failure with hypoxia: Secondary | ICD-10-CM | POA: Diagnosis not present

## 2019-03-18 DIAGNOSIS — J449 Chronic obstructive pulmonary disease, unspecified: Secondary | ICD-10-CM | POA: Diagnosis not present

## 2019-04-01 DIAGNOSIS — J449 Chronic obstructive pulmonary disease, unspecified: Secondary | ICD-10-CM | POA: Diagnosis not present

## 2019-04-12 ENCOUNTER — Telehealth: Payer: Self-pay

## 2019-04-12 ENCOUNTER — Other Ambulatory Visit: Payer: Self-pay | Admitting: Internal Medicine

## 2019-04-12 NOTE — Telephone Encounter (Signed)
Copied from [place] [id number]. Topic: Quick Communication - Rx Refill/Question >> Apr 12, 2019  9:04 AM Carolyn Stare wrote: Medication ALPRAZolam Duanne Moron) 1 MG tablet   pt said she only has 1 pill and would like to pick up tomorrow  [facility] [address]   Agent: Please be advised that RX refills may take up to 3 business days. We ask that you follow-up with your pharmacy.

## 2019-04-12 NOTE — Telephone Encounter (Signed)
Rx sent 

## 2019-04-16 ENCOUNTER — Ambulatory Visit (INDEPENDENT_AMBULATORY_CARE_PROVIDER_SITE_OTHER): Payer: PPO | Admitting: Internal Medicine

## 2019-04-16 ENCOUNTER — Encounter: Payer: Self-pay | Admitting: Internal Medicine

## 2019-04-16 DIAGNOSIS — M1611 Unilateral primary osteoarthritis, right hip: Secondary | ICD-10-CM

## 2019-04-16 DIAGNOSIS — M7989 Other specified soft tissue disorders: Secondary | ICD-10-CM

## 2019-04-16 MED ORDER — DICLOFENAC SODIUM 75 MG PO TBEC: 75.0000 mg | DELAYED_RELEASE_TABLET | Freq: Two times a day (BID) | ORAL | 2 refills | Status: DC

## 2019-04-16 NOTE — Assessment & Plan Note (Signed)
Bilateral foot swelling that started the beginning of September No shortness of breath or other concerns of heart failure Swelling initially thought to be related to meloxicam, which discontinued-she states no improvement after discontinuing medication Has been elevating her legs some We will try the compression socks Discussed water pill, but she would like to hold off to see if the compression socks help Has follow-up next month and we will reevaluate then

## 2019-04-16 NOTE — Assessment & Plan Note (Signed)
Severe pain from osteoarthritis of right hip and right knee She is taking Tylenol, which helps minimally Meloxicam did not seem to help much when she was taking it She does not tolerate pain medications well because they cause nausea We will try diclofenac twice daily with food-discussed this could possibly increase her leg swelling and she will need to monitor for that Also discussed that it may affect her stomach and her kidneys-trial of 75 mg twice daily with food-follow-up in 3 weeks

## 2019-04-16 NOTE — Progress Notes (Signed)
Virtual Visit via Video Note  I connected with [name] on 04/16/19 at  1:30 PM EDT by a video enabled telemedicine application and verified that I am speaking with the correct person using two identifiers.   I discussed the limitations of evaluation and management by telemedicine and the availability of in person appointments. The patient expressed understanding and agreed to proceed.  The patient is currently at home and I am in the office.  Her daughter is there with her and helps provide some of the history.  No referring provider.    History of Present Illness: This is an acute visit for leg swelling  Right hip and knee pain: She is chronic right hip and knee arthritis.  She has had injections in both joints, which helped minimally.  She was taking meloxicam, but stated that it did not help much and there was concerned that it was causing leg swelling.  This medication was discontinued.  She has been taking Tylenol.  Her pain is not controlled and she wondered if there is something else she could try.   Leg edema: She has bilateral feet and ankle swelling, but her right side is much worse.  We initially thought the meloxicam was the cause so was discontinued.  She thinks the swelling did not improve after that was discontinued and it is worse now.  She has not tried the compression socks.  She is fairly compliant with a low-sodium diet.  She does elevate her legs some, but is limited because of her knee arthritis.  The swelling does improve a little overnight.  She is not experiencing any new shortness of breath.  She has seen pulmonary and had a chest x-ray and her lungs sounded good and were stable.   Social History   Socioeconomic History  . Marital status: Married    Spouse name: Not on file  . Number of children: 2  . Years of education: 58  . Highest education level: High school graduate  Occupational History  . Occupation: Retired Animator, Production designer, theatre/television/film: RETIRED  Social Needs  . Financial resource strain: Not on file  . Food insecurity    Worry: Not on file    Inability: Not on file  . Transportation needs    Medical: Not on file    Non-medical: Not on file  Tobacco Use  . Smoking status: Former Smoker    Packs/day: 1.00    Years: 42.00    Pack years: 42.00    Types: Cigarettes    Quit date: 09/22/2003    Years since quitting: 15.5  . Smokeless tobacco: Never Used  Substance and Sexual Activity  . Alcohol use: Yes    Alcohol/week: 14.0 standard drinks    Types: 14 Glasses of wine per week    Comment: 12/22/2014 "I have a large glass of wine qd"  . Drug use: No  . Sexual activity: Never  Lifestyle  . Physical activity    Days per week: Not on file    Minutes per session: Not on file  . Stress: Not on file  Relationships  . Social Herbalist on phone: Not on file    Gets together: Not on file    Attends religious service: Not on file    Active member of club or organization: Not on file    Attends meetings of clubs or organizations: Not on file    Relationship status: Not on file  Other  Topics Concern  . Not on file  Social History Narrative   Lives with husband in a one story home.  Has 2 children.  Retired.  Education: high school.      Observations/Objective: Appears well in NAD 1+ pitting right foot edema, left foot does not appear to have edema  Assessment and Plan:  See Problem List for Assessment and Plan of chronic medical problems.   Follow Up Instructions:    I discussed the assessment and treatment plan with the patient. The patient was provided an opportunity to ask questions and all were answered. The patient agreed with the plan and demonstrated an understanding of the instructions.   The patient was advised to call back or seek an in-person evaluation if the symptoms worsen or if the condition fails to improve as anticipated.    Binnie Rail, MD

## 2019-04-17 DIAGNOSIS — J9611 Chronic respiratory failure with hypoxia: Secondary | ICD-10-CM | POA: Diagnosis not present

## 2019-04-17 DIAGNOSIS — J449 Chronic obstructive pulmonary disease, unspecified: Secondary | ICD-10-CM | POA: Diagnosis not present

## 2019-05-02 DIAGNOSIS — J449 Chronic obstructive pulmonary disease, unspecified: Secondary | ICD-10-CM | POA: Diagnosis not present

## 2019-05-02 NOTE — Progress Notes (Signed)
Virtual Visit via Video Note  I connected with [name] on 05/03/19 at  2:00 PM EST by a video enabled telemedicine application and verified that I am speaking with the correct person using two identifiers.   I discussed the limitations of evaluation and management by telemedicine and the availability of in person appointments. The patient expressed understanding and agreed to proceed.  Present for the visit:  Myself, Dr Billey Gosling, [name] and her daughter [name].   The patient is currently at home and I am in the office.    No referring provider.    History of Present Illness: She is here for follow up of her chronic medical conditions.   She is not exercising regularly.     COPD, chronic respiratory failure with hypoxia on oxygen: She is following with pulmonary.  Her breathing is about the same.  Her cough is a little better.    Hypothyroidism:  She is taking her medication daily.  She denies any recent changes in energy or weight that are unexplained.   Hyperlipidemia: She is taking her medication daily. She is compliant with a low fat/cholesterol diet. She denies myalgias.   Depression, anxiety: She is taking her Xanax daily.  I had started her on Lexapro 5 mg daily in June, but she did not think it worked.  She denies side effects.  She has some anxiety at times.  She feels depressed at times as well.  Osteoarthritis, severe knee and hip pain: She takes Tylenol or ibuprofen as needed. Diclofenac orally has not helped.  She uses voltaren gel.  Nothing really helps. She has difficulty ambulating.     9/4 fell at home and she still has wounds.  Her son has been changing the wound daily.  Has been using medihoney and silver sulfadiazine.  The wound on her left lower leg has a yellow appearance and they are concerned it is taking so long - they know all her wounds take a long time.  The other wounds are healing better.      Review of Systems  Constitutional:  Negative for chills and fever.  Respiratory: Positive for cough and shortness of breath. Negative for wheezing.   Cardiovascular: Positive for leg swelling (feet). Negative for chest pain and palpitations.  Neurological: Negative for headaches.     Social History   Socioeconomic History  . Marital status: Married    Spouse name: Not on file  . Number of children: 2  . Years of education: 89  . Highest education level: High school graduate  Occupational History  . Occupation: Retired Animator, Psychiatrist: RETIRED  Social Needs  . Financial resource strain: Not on file  . Food insecurity    Worry: Not on file    Inability: Not on file  . Transportation needs    Medical: Not on file    Non-medical: Not on file  Tobacco Use  . Smoking status: Former Smoker    Packs/day: 1.00    Years: 42.00    Pack years: 42.00    Types: Cigarettes    Quit date: 09/22/2003    Years since quitting: 15.6  . Smokeless tobacco: Never Used  Substance and Sexual Activity  . Alcohol use: Yes    Alcohol/week: 14.0 standard drinks    Types: 14 Glasses of wine per week    Comment: 12/22/2014 "I have a large glass of wine qd"  . Drug use: No  . Sexual  activity: Never  Lifestyle  . Physical activity    Days per week: Not on file    Minutes per session: Not on file  . Stress: Not on file  Relationships  . Social Herbalist on phone: Not on file    Gets together: Not on file    Attends religious service: Not on file    Active member of club or organization: Not on file    Attends meetings of clubs or organizations: Not on file    Relationship status: Not on file  Other Topics Concern  . Not on file  Social History Narrative   Lives with husband in a one story home.  Has 2 children.  Retired.  Education: high school.      Observations/Objective: Appears well in NAD Breathing normally on oxygen via [place] Wound left lower  With minimal surrounding erythema that  appears normal for healing, yellow granulation tissue, no pu discharge / bleeding.    Assessment and Plan:  See Problem List for Assessment and Plan of chronic medical problems.   Follow Up Instructions:    I discussed the assessment and treatment plan with the patient. The patient was provided an opportunity to ask questions and all were answered. The patient agreed with the plan and demonstrated an understanding of the instructions.   The patient was advised to call back or seek an in-person evaluation if the symptoms worsen or if the condition fails to improve as anticipated.  FU in 6 months  Michelle Rail, MD

## 2019-05-03 ENCOUNTER — Encounter: Payer: Self-pay | Admitting: Internal Medicine

## 2019-05-03 ENCOUNTER — Ambulatory Visit (INDEPENDENT_AMBULATORY_CARE_PROVIDER_SITE_OTHER): Payer: PPO | Admitting: Internal Medicine

## 2019-05-03 DIAGNOSIS — E785 Hyperlipidemia, unspecified: Secondary | ICD-10-CM | POA: Diagnosis not present

## 2019-05-03 DIAGNOSIS — J9611 Chronic respiratory failure with hypoxia: Secondary | ICD-10-CM | POA: Diagnosis not present

## 2019-05-03 DIAGNOSIS — M1611 Unilateral primary osteoarthritis, right hip: Secondary | ICD-10-CM | POA: Diagnosis not present

## 2019-05-03 DIAGNOSIS — S81809A Unspecified open wound, unspecified lower leg, initial encounter: Secondary | ICD-10-CM | POA: Insufficient documentation

## 2019-05-03 DIAGNOSIS — F419 Anxiety disorder, unspecified: Secondary | ICD-10-CM | POA: Diagnosis not present

## 2019-05-03 DIAGNOSIS — E038 Other specified hypothyroidism: Secondary | ICD-10-CM

## 2019-05-03 DIAGNOSIS — F3289 Other specified depressive episodes: Secondary | ICD-10-CM

## 2019-05-03 DIAGNOSIS — S81802D Unspecified open wound, left lower leg, subsequent encounter: Secondary | ICD-10-CM

## 2019-05-03 NOTE — Assessment & Plan Note (Signed)
lexapro did not help, but only tried 5 mg  Depression about her limitations Will monitor for now

## 2019-05-03 NOTE — Assessment & Plan Note (Signed)
Left lower extremity from fall 02/22/2019 Continue local wound care as they are Will order referral for home health nursing to assess wound/advise on further treatment

## 2019-05-03 NOTE — Assessment & Plan Note (Signed)
Stable Taking xanax nightly  continue

## 2019-05-03 NOTE — Assessment & Plan Note (Signed)
Overall stable Following with pulmonary Using her inhalers

## 2019-05-03 NOTE — Assessment & Plan Note (Signed)
nsaids not helping Continue tylenol regularly voltaren gel

## 2019-05-03 NOTE — Assessment & Plan Note (Signed)
Continue daily statin Regular exercise and healthy diet encouraged

## 2019-05-03 NOTE — Assessment & Plan Note (Addendum)
Clinically euthyroid Continue current medication dose

## 2019-05-07 ENCOUNTER — Ambulatory Visit: Payer: PPO | Admitting: Internal Medicine

## 2019-05-07 ENCOUNTER — Telehealth: Payer: Self-pay | Admitting: Internal Medicine

## 2019-05-07 NOTE — Telephone Encounter (Signed)
Spoke to patient's daughter. She will call back to schedule.

## 2019-05-07 NOTE — Telephone Encounter (Signed)
-----   Message from Binnie Rail, MD sent at 05/03/2019  6:44 PM EST ----- She needs a f/u with me in 6 months

## 2019-05-10 DIAGNOSIS — S81802D Unspecified open wound, left lower leg, subsequent encounter: Secondary | ICD-10-CM | POA: Diagnosis not present

## 2019-05-10 DIAGNOSIS — M47816 Spondylosis without myelopathy or radiculopathy, lumbar region: Secondary | ICD-10-CM | POA: Diagnosis not present

## 2019-05-10 DIAGNOSIS — Z87891 Personal history of nicotine dependence: Secondary | ICD-10-CM | POA: Diagnosis not present

## 2019-05-10 DIAGNOSIS — M5030 Other cervical disc degeneration, unspecified cervical region: Secondary | ICD-10-CM | POA: Diagnosis not present

## 2019-05-10 DIAGNOSIS — G629 Polyneuropathy, unspecified: Secondary | ICD-10-CM | POA: Diagnosis not present

## 2019-05-10 DIAGNOSIS — N312 Flaccid neuropathic bladder, not elsewhere classified: Secondary | ICD-10-CM | POA: Diagnosis not present

## 2019-05-10 DIAGNOSIS — E038 Other specified hypothyroidism: Secondary | ICD-10-CM | POA: Diagnosis not present

## 2019-05-10 DIAGNOSIS — F3289 Other specified depressive episodes: Secondary | ICD-10-CM | POA: Diagnosis not present

## 2019-05-10 DIAGNOSIS — F419 Anxiety disorder, unspecified: Secondary | ICD-10-CM | POA: Diagnosis not present

## 2019-05-10 DIAGNOSIS — J9611 Chronic respiratory failure with hypoxia: Secondary | ICD-10-CM | POA: Diagnosis not present

## 2019-05-10 DIAGNOSIS — C3490 Malignant neoplasm of unspecified part of unspecified bronchus or lung: Secondary | ICD-10-CM | POA: Diagnosis not present

## 2019-05-10 DIAGNOSIS — Z9181 History of falling: Secondary | ICD-10-CM | POA: Diagnosis not present

## 2019-05-10 DIAGNOSIS — M1711 Unilateral primary osteoarthritis, right knee: Secondary | ICD-10-CM | POA: Diagnosis not present

## 2019-05-10 DIAGNOSIS — Z9981 Dependence on supplemental oxygen: Secondary | ICD-10-CM | POA: Diagnosis not present

## 2019-05-10 DIAGNOSIS — E785 Hyperlipidemia, unspecified: Secondary | ICD-10-CM | POA: Diagnosis not present

## 2019-05-10 DIAGNOSIS — G8929 Other chronic pain: Secondary | ICD-10-CM | POA: Diagnosis not present

## 2019-05-10 DIAGNOSIS — J449 Chronic obstructive pulmonary disease, unspecified: Secondary | ICD-10-CM | POA: Diagnosis not present

## 2019-05-10 DIAGNOSIS — M1611 Unilateral primary osteoarthritis, right hip: Secondary | ICD-10-CM | POA: Diagnosis not present

## 2019-05-10 DIAGNOSIS — M8580 Other specified disorders of bone density and structure, unspecified site: Secondary | ICD-10-CM | POA: Diagnosis not present

## 2019-05-12 IMAGING — DX DG KNEE COMPLETE 4+V*R*
4 series · 4 of 4 positions shown · non-contrast
Comparison: None.

CLINICAL DATA: Primary osteoarthritis of right knee

EXAM:
RIGHT KNEE - COMPLETE 4+ VIEW

[knee ap]
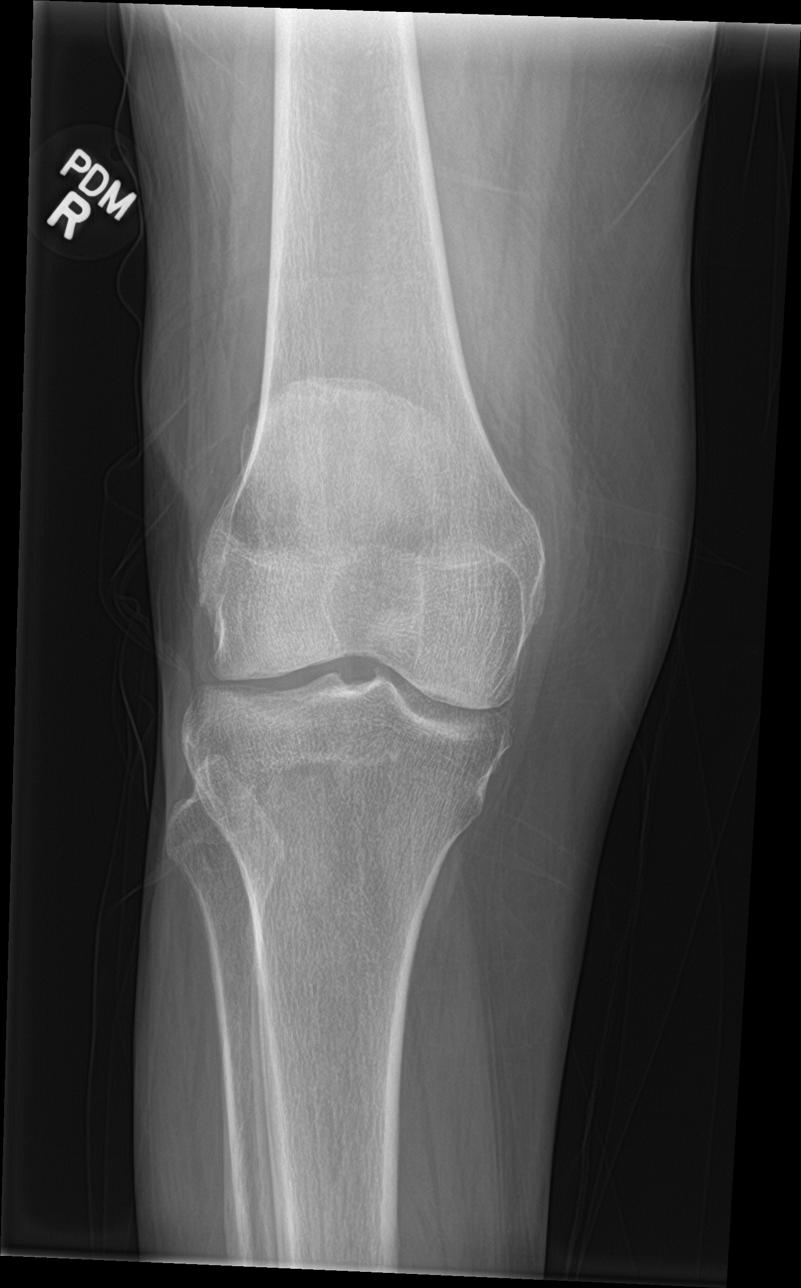

[knee lat]
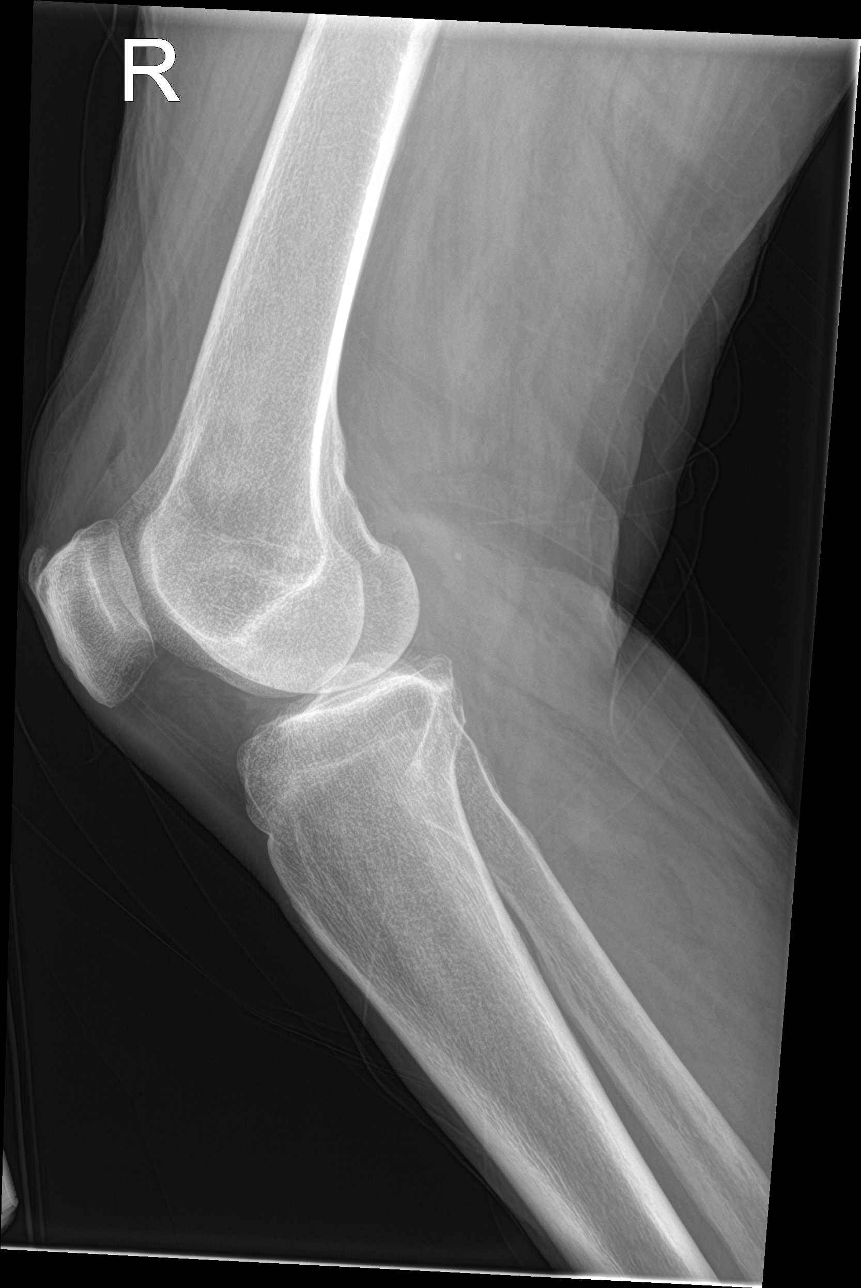

[knee obl (1 of 2)]
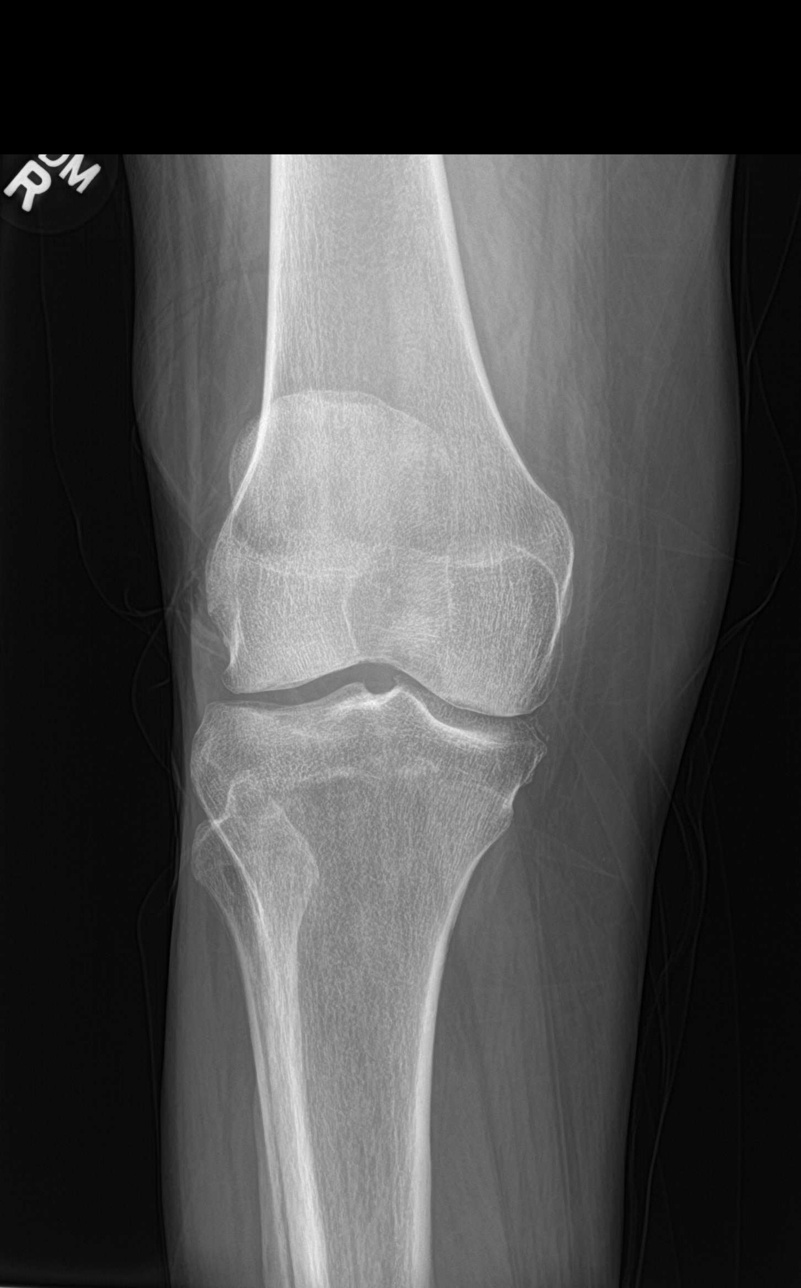

[knee obl (2 of 2)]
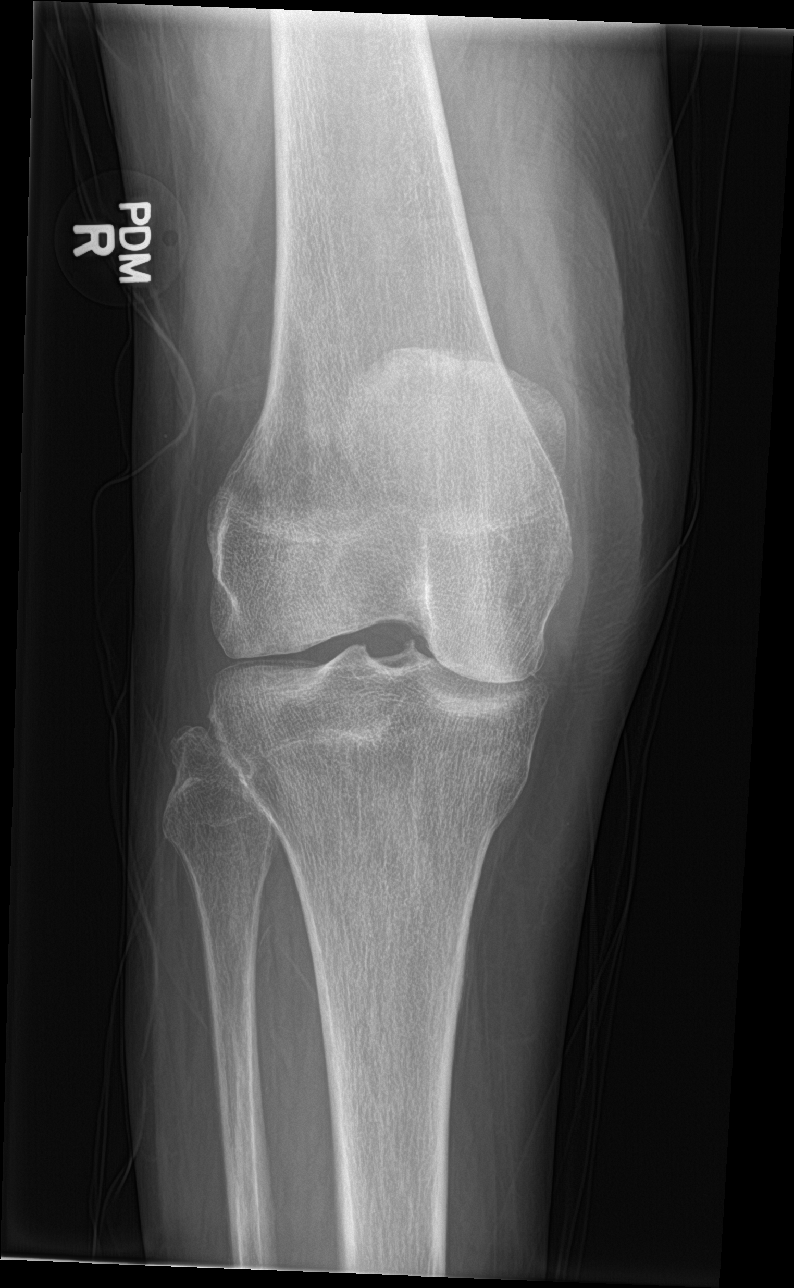

[4 of 4 positions shown; findings below may reference images not displayed]

FINDINGS: Small joint effusion. Mild degenerative marginal spurring about the
medial compartment. Possible mild femorotibial compartment
narrowing. No fracture deformity or evidence of bone lesion.
IMPRESSION: 1. Mild degenerative spurring and joint narrowing.
2. Small joint effusion.

## 2019-05-15 ENCOUNTER — Other Ambulatory Visit: Payer: Self-pay

## 2019-05-18 DIAGNOSIS — J9611 Chronic respiratory failure with hypoxia: Secondary | ICD-10-CM | POA: Diagnosis not present

## 2019-05-18 DIAGNOSIS — J449 Chronic obstructive pulmonary disease, unspecified: Secondary | ICD-10-CM | POA: Diagnosis not present

## 2019-05-20 ENCOUNTER — Emergency Department (HOSPITAL_COMMUNITY): Payer: PPO

## 2019-05-20 ENCOUNTER — Emergency Department (HOSPITAL_COMMUNITY)
Admission: EM | Admit: 2019-05-20 | Discharge: 2019-05-20 | Disposition: A | Discharge status: Home / Self Care | Payer: PPO | Attending: Emergency Medicine | Admitting: Emergency Medicine

## 2019-05-20 ENCOUNTER — Other Ambulatory Visit: Payer: Self-pay | Admitting: Internal Medicine

## 2019-05-20 ENCOUNTER — Other Ambulatory Visit: Payer: Self-pay

## 2019-05-20 DIAGNOSIS — Z20828 Contact with and (suspected) exposure to other viral communicable diseases: Secondary | ICD-10-CM | POA: Diagnosis not present

## 2019-05-20 DIAGNOSIS — J441 Chronic obstructive pulmonary disease with (acute) exacerbation: Secondary | ICD-10-CM | POA: Insufficient documentation

## 2019-05-20 DIAGNOSIS — B9789 Other viral agents as the cause of diseases classified elsewhere: Secondary | ICD-10-CM | POA: Diagnosis not present

## 2019-05-20 DIAGNOSIS — R05 Cough: Secondary | ICD-10-CM | POA: Insufficient documentation

## 2019-05-20 DIAGNOSIS — M1611 Unilateral primary osteoarthritis, right hip: Secondary | ICD-10-CM

## 2019-05-20 DIAGNOSIS — G629 Polyneuropathy, unspecified: Secondary | ICD-10-CM

## 2019-05-20 DIAGNOSIS — S3993XA Unspecified injury of pelvis, initial encounter: Secondary | ICD-10-CM | POA: Diagnosis not present

## 2019-05-20 DIAGNOSIS — N312 Flaccid neuropathic bladder, not elsewhere classified: Secondary | ICD-10-CM

## 2019-05-20 DIAGNOSIS — G8929 Other chronic pain: Secondary | ICD-10-CM

## 2019-05-20 DIAGNOSIS — F419 Anxiety disorder, unspecified: Secondary | ICD-10-CM

## 2019-05-20 DIAGNOSIS — U071 COVID-19: Secondary | ICD-10-CM | POA: Insufficient documentation

## 2019-05-20 DIAGNOSIS — J069 Acute upper respiratory infection, unspecified: Secondary | ICD-10-CM | POA: Diagnosis not present

## 2019-05-20 DIAGNOSIS — F3289 Other specified depressive episodes: Secondary | ICD-10-CM

## 2019-05-20 DIAGNOSIS — W19XXXA Unspecified fall, initial encounter: Secondary | ICD-10-CM

## 2019-05-20 DIAGNOSIS — Z9981 Dependence on supplemental oxygen: Secondary | ICD-10-CM

## 2019-05-20 DIAGNOSIS — E785 Hyperlipidemia, unspecified: Secondary | ICD-10-CM

## 2019-05-20 DIAGNOSIS — M1711 Unilateral primary osteoarthritis, right knee: Secondary | ICD-10-CM

## 2019-05-20 DIAGNOSIS — R0602 Shortness of breath: Secondary | ICD-10-CM | POA: Diagnosis not present

## 2019-05-20 DIAGNOSIS — R509 Fever, unspecified: Secondary | ICD-10-CM | POA: Diagnosis present

## 2019-05-20 DIAGNOSIS — Z87891 Personal history of nicotine dependence: Secondary | ICD-10-CM

## 2019-05-20 DIAGNOSIS — C3490 Malignant neoplasm of unspecified part of unspecified bronchus or lung: Secondary | ICD-10-CM

## 2019-05-20 DIAGNOSIS — Z9181 History of falling: Secondary | ICD-10-CM

## 2019-05-20 DIAGNOSIS — R000 Tachycardia, unspecified: Secondary | ICD-10-CM | POA: Diagnosis not present

## 2019-05-20 DIAGNOSIS — R079 Chest pain, unspecified: Secondary | ICD-10-CM | POA: Diagnosis not present

## 2019-05-20 DIAGNOSIS — J9611 Chronic respiratory failure with hypoxia: Secondary | ICD-10-CM | POA: Diagnosis not present

## 2019-05-20 DIAGNOSIS — M5030 Other cervical disc degeneration, unspecified cervical region: Secondary | ICD-10-CM

## 2019-05-20 DIAGNOSIS — J449 Chronic obstructive pulmonary disease, unspecified: Secondary | ICD-10-CM | POA: Diagnosis not present

## 2019-05-20 DIAGNOSIS — E038 Other specified hypothyroidism: Secondary | ICD-10-CM | POA: Diagnosis not present

## 2019-05-20 DIAGNOSIS — M47816 Spondylosis without myelopathy or radiculopathy, lumbar region: Secondary | ICD-10-CM

## 2019-05-20 DIAGNOSIS — S8991XA Unspecified injury of right lower leg, initial encounter: Secondary | ICD-10-CM | POA: Diagnosis not present

## 2019-05-20 DIAGNOSIS — S51812D Laceration without foreign body of left forearm, subsequent encounter: Secondary | ICD-10-CM | POA: Diagnosis not present

## 2019-05-20 DIAGNOSIS — S81001D Unspecified open wound, right knee, subsequent encounter: Secondary | ICD-10-CM | POA: Diagnosis not present

## 2019-05-20 DIAGNOSIS — M8580 Other specified disorders of bone density and structure, unspecified site: Secondary | ICD-10-CM

## 2019-05-20 DIAGNOSIS — S81802D Unspecified open wound, left lower leg, subsequent encounter: Secondary | ICD-10-CM | POA: Diagnosis not present

## 2019-05-20 DIAGNOSIS — I10 Essential (primary) hypertension: Secondary | ICD-10-CM | POA: Diagnosis not present

## 2019-05-20 LAB — TSH: TSH: 1.2 u[IU]/mL (ref 0.350–4.500)

## 2019-05-20 LAB — CBC
HCT: 34.3 % — ABNORMAL LOW (ref 36.0–46.0)
Hemoglobin: 11.5 g/dL — ABNORMAL LOW (ref 12.0–15.0)
MCH: 32.7 pg (ref 26.0–34.0)
MCHC: 33.5 g/dL (ref 30.0–36.0)
MCV: 97.4 fL (ref 80.0–100.0)
Platelets: 170 10*3/uL (ref 150–400)
RBC: 3.52 MIL/uL — ABNORMAL LOW (ref 3.87–5.11)
RDW: 12.6 % (ref 11.5–15.5)
WBC: 9.6 10*3/uL (ref 4.0–10.5)
nRBC: 0 % (ref 0.0–0.2)

## 2019-05-20 LAB — POC SARS CORONAVIRUS 2 AG -  ED: SARS Coronavirus 2 Ag: NEGATIVE

## 2019-05-20 LAB — BASIC METABOLIC PANEL
Anion gap: 9 (ref 5–15)
BUN: 7 mg/dL — ABNORMAL LOW (ref 8–23)
CO2: 31 mmol/L (ref 22–32)
Calcium: 9.2 mg/dL (ref 8.9–10.3)
Chloride: 89 mmol/L — ABNORMAL LOW (ref 98–111)
Creatinine, Ser: 0.8 mg/dL (ref 0.44–1.00)
GFR calc Af Amer: >60 mL/min (ref >60)
GFR calc non Af Amer: >60 mL/min (ref >60)
Glucose, Bld: 104 mg/dL — ABNORMAL HIGH (ref 70–99)
Potassium: 3.9 mmol/L (ref 3.5–5.1)
Sodium: 129 mmol/L — ABNORMAL LOW (ref 135–145)

## 2019-05-20 LAB — MAGNESIUM: Magnesium: 1.6 mg/dL — ABNORMAL LOW (ref 1.7–2.4)

## 2019-05-20 MED ORDER — PREDNISONE 10 MG PO TABS: 20.0000 mg | ORAL_TABLET | Freq: Every day | ORAL | 0 refills | Status: AC

## 2019-05-20 MED ORDER — IPRATROPIUM-ALBUTEROL 0.5-2.5 (3) MG/3ML IN SOLN
3.0000 mL | RESPIRATORY_TRACT | Status: AC
  Administered 2019-05-20 (×2): 3 mL via RESPIRATORY_TRACT
  Filled 2019-05-20 (×2): qty 3

## 2019-05-20 MED ORDER — MAGNESIUM SULFATE 2 GM/50ML IV SOLN
2.0000 g | Freq: Once | INTRAVENOUS | Status: AC
  Administered 2019-05-20: 2 g via INTRAVENOUS
  Filled 2019-05-20: qty 50

## 2019-05-20 MED ORDER — PREDNISONE 20 MG PO TABS
60.0000 mg | ORAL_TABLET | Freq: Once | ORAL | Status: AC
  Administered 2019-05-20: 60 mg via ORAL
  Filled 2019-05-20: qty 3

## 2019-05-20 MED ORDER — ACETAMINOPHEN 325 MG PO TABS
650.0000 mg | ORAL_TABLET | Freq: Once | ORAL | Status: AC
  Administered 2019-05-20: 650 mg via ORAL
  Filled 2019-05-20: qty 2

## 2019-05-20 NOTE — ED Notes (Signed)
Awaiting daughter for ride and appropriate clothing

## 2019-05-20 NOTE — ED Triage Notes (Signed)
Pt arrives via EMS from home with cough and fever to 101 at home, hx of COPD. HR 110, 160/70, wears O2 all the time at 3L, sat 99%. 20g RFA. cbg 135. Alert, oriented x4. Generalized weakness. Recent falls.

## 2019-05-20 NOTE — ED Provider Notes (Signed)
[facility] EMERGENCY DEPARTMENT Provider Note   CSN: [id number] Arrival date & time: 05/20/19  1250     History   Chief Complaint Chief Complaint  Patient presents with  . Cough  . Fever    HPI [name] is a 85 y.o. female.     HPI  Pt is an 85 year old female with PMH of COPD (3L O2 at home), Hx of NSCLC s/p resection, HLD, hypothyroidism who presents to the ED with concern for fever and cough. Patient reports she has a baseline cough with her underlying COPD but has noticed it has been more productive of yellow-green sputum since yesterday.  She also states she has had subjective fever and temp of 99 at home.  She and her daughter report that she was getting out of bed around 1 AM this morning when she slid down to the ground.  Patient states she slid down because of a rub with side of her bed.  She states she did not hit her head or lose consciousness.  EMS was called to help get her back into bed as her husband was unable to lift her.  Patient reports she was not lying in bed for long time.  She states again around 11:00 today she went to get out of bed and slipped on the rug and fell onto her right leg.  Patient again denies hitting her head or loss of conscious during this fall.  Typically ambulates with a walker.  She states she has been sliding out of bed and needs to remove the rug beside it.  Her daughter reports she has been having some generalized weakness recently.  Patient is not on any anticoagulation.  Of note, patient's husband has also had a cough and viral type illness.  No other known sick contacts.  Patient states she has been breathing okay but does notice that she has been wheezing some more.  She denies any chest pain.  Patient denies any abdominal pain.  From a trauma standpoint, patient denies any headache or vision changes.  She states she has soreness mostly in her right hip and right knee.  Past Medical History:  Diagnosis Date  .  Allergic rhinitis   . Anxiety   . Arthritis    "back, hands; hips" (12/22/2014)  . Asthma   . Bladder atony   . Chronic bronchitis ([place])   . COPD (chronic obstructive pulmonary disease) ([place])   . Diarrhea   . Emphysema of lung ([place])   . GERD (gastroesophageal reflux disease)    "w/spicey foods" (12/22/2014)  . Hyperlipidemia   . Non-small cell carcinoma of lung, stage 1 ([place]) 2005   1.4 cm Poorly differentiated Squamous cell RUL  T1N0 resected 09/22/2003  . On home oxygen therapy    "3L q night and prn during the day" (12/22/2014)  . Pelvic cyst   . Pneumonia    "this is the 3rd time that I can remember" (12/22/2014)  . Unspecified hypothyroidism     Patient Active Problem List   Diagnosis Date Noted  . Non-healing wound of lower extremity 05/03/2019  . Multiple skin tears 03/02/2019  . Weight loss 11/20/2018  . Depression 11/20/2018  . Recurrent falls 11/20/2018  . Parotiditis 09/17/2018  . Primary osteoarthritis of right hip 06/21/2018  . Degenerative disc disease, cervical 12/06/2017  . DOE (dyspnea on exertion) 11/21/2017  . Bilateral swelling of feet 11/02/2017  . Osteopenia 02/16/2017  . Hyperglycemia 12/08/2016  .  Degenerative arthritis of right knee 10/27/2016  . 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 Greater trochanteric bursitis of both hips 04/08/2016  . Pain in both knees 04/08/2016  . Chronic midline low back pain without sciatica 04/08/2016  . Chronic respiratory failure with hypoxia ([place]) 03/07/2015  . Diverticulitis 05/24/2014  . Anxiety   . Bladder prolapse, female, acquired   . Non-small cell carcinoma of lung, stage 1 ([place]) 01/10/2012  . Neuropathy, peripheral 01/28/2011  . COPD mixed type ([place]) 09/08/2010  . Dyslipidemia   . ATONY OF BLADDER 08/10/2009  . Hypothyroidism 03/12/2008  . Seasonal allergic rhinitis 10/10/2007    Past Surgical History:  Procedure Laterality Date  . ABDOMINAL HYSTERECTOMY  ~ 1976  . BACK  SURGERY    . CATARACT EXTRACTION W/ INTRAOCULAR LENS  IMPLANT, BILATERAL Bilateral ~ 2012  . DILATION AND CURETTAGE OF UTERUS    . LUMBAR DISC SURGERY  1970's  . LYMPH NODE DISSECTION  2005   "all my lymph nodes under breasts, went thru my back; Dr. Arlyce Dice did it"  . Needle aspiration Pelvic cyst  2011   Dr Diona Fanti  . VIDEO ASSISTED THORACOSCOPY (VATS)/THOROCOTOMY Right 09/2003   thoracotomy, right upper lobectomy with node dissection; Dr Demetrios Loll 11/02/2010  . VIDEO BRONCHOSCOPY  03/2004   Archie Endo 11/02/2010     OB History   No obstetric history on file.      Home Medications    Prior to Admission medications   Medication Sig Start Date End Date Taking? Authorizing Provider  acetaminophen (TYLENOL ARTHRITIS PAIN) 650 MG CR tablet Take 650 mg by mouth daily as needed for pain.     [provider]  ALPRAZolam Duanne Moron) 1 MG tablet TAKE 1 TABLET BY MOUTH EVERYDAY AT BEDTIME 04/12/19   Burns, Claudina Lick, MD  atorvastatin (LIPITOR) 20 MG tablet TAKE 1 TABLET (20 MG TOTAL) BY MOUTH DAILY AT 6 PM. 05/20/19   Burns, Claudina Lick, MD  Cholecalciferol (VITAMIN D) 2000 UNITS tablet Take 1 tablet (2,000 Units total) by mouth daily. 04/29/15   Rowe Clack, MD  FLOVENT HFA 110 MCG/ACT inhaler TAKE 2 PUFFS BY MOUTH TWICE A DAY 03/19/19   Young, Tarri Fuller D, MD  levalbuterol Penne Lash) 1.25 MG/3ML nebulizer solution Take 1.25 mg by nebulization every 6 (six) hours as needed for wheezing. DX: Bronchiectasis 05/29/18   Baird Lyons D, MD  levothyroxine (SYNTHROID) 88 MCG tablet TAKE 1 TABLET BY MOUTH EVERY DAY 03/06/19   Binnie Rail, MD  Multiple Vitamins-Minerals (CENTRUM SILVER PO) Take 1 tablet by mouth daily.     [provider]  ondansetron (ZOFRAN ODT) 4 MG disintegrating tablet Take 1 tablet (4 mg total) by mouth every 8 (eight) hours as needed for nausea or vomiting. 09/17/18   Burns, Claudina Lick, MD  predniSONE (DELTASONE) 10 MG tablet Take 2 tablets (20 mg total) by mouth daily  for 4 days. 05/20/19 05/24/19  Burns Spain, MD  Probiotic Product (PROBIOTIC PO) Take 1 capsule by mouth daily.    [provider]  Respiratory Therapy Supplies (FLUTTER) DEVI Blow through 4 times per cycle and repeat 3 cycles per day 10/15/13   Deneise Lever, MD  Respiratory Therapy Supplies (NEBULIZER/TUBING/MOUTHPIECE) KIT As directed 05/24/18   Deneise Lever, MD  silver sulfADIAZINE (SILVADENE) 1 % cream Apply 1 application topically daily. 03/01/19   Binnie Rail, MD    Family History Family History  Problem Relation Age of Onset  . Heart  disease Father   . Rheumatologic disease Brother   . Cancer Brother        Leukemia  . Rheumatologic disease Sister   . Breast cancer Daughter     Social History Social History   Tobacco Use  . Smoking status: Former Smoker    Packs/day: 1.00    Years: 42.00    Pack years: 42.00    Types: Cigarettes    Quit date: 09/22/2003    Years since quitting: 15.6  . Smokeless tobacco: Never Used  Substance Use Topics  . Alcohol use: Yes    Alcohol/week: 14.0 standard drinks    Types: 14 Glasses of wine per week    Comment: 12/22/2014 "I have a large glass of wine qd"  . Drug use: No     Allergies   Ampicillin, Azithromycin, Codeine, Daliresp [roflumilast], Nitrofurantoin, Other, Penicillins, Sulfonamide derivatives, Tiotropium bromide monohydrate, Doxycycline, Fludrocortisone acetate, Lactose intolerance (gi), Latex, and Prednisone   Review of Systems Review of Systems  Constitutional: Positive for chills and fever. Negative for appetite change.  HENT: Negative for congestion.   Respiratory: Positive for cough and wheezing. Negative for shortness of breath.   Cardiovascular: Negative for chest pain and leg swelling.  Gastrointestinal: Negative for abdominal pain, diarrhea, nausea and vomiting.  Genitourinary: Negative for dysuria.  Musculoskeletal: Positive for arthralgias. Negative for back pain.  Skin: Positive for wound.   Neurological: Positive for weakness (Generalized). Negative for syncope and headaches.  Psychiatric/Behavioral: Negative for agitation and behavioral problems.     Physical Exam Updated Vital Signs BP 115/67   Pulse 99   Temp 98.1 F (36.7 C) (Oral)   Resp 20   SpO2 (!) 89%   Physical Exam Vitals signs and nursing note reviewed.  Constitutional:      General: She is not in acute distress.    Appearance: She is well-developed.  HENT:     Head: Normocephalic and atraumatic.  Eyes:     Extraocular Movements: Extraocular movements intact.     Conjunctiva/sclera: Conjunctivae normal.     Pupils: Pupils are equal, round, and reactive to light.  Neck:     Musculoskeletal: Neck supple. No muscular tenderness.  Cardiovascular:     Rate and Rhythm: Regular rhythm. Tachycardia present.     Heart sounds: No murmur.  Pulmonary:     Effort: No respiratory distress.     Breath sounds: Wheezing (Diffuse) present.  Abdominal:     Palpations: Abdomen is soft.     Tenderness: There is no abdominal tenderness.  Musculoskeletal:     Comments: Skin tear/abrasion to R anterior knee, hemostatic Able to range R hip and knee Endorses TTP over R hip 2+ DP pulses bilaterally  No obvious swelling or deformity of extremities   Skin:    General: Skin is warm and dry.  Neurological:     General: No focal deficit present.     Mental Status: She is alert.     Cranial Nerves: No cranial nerve deficit.     Sensory: No sensory deficit.     Motor: No weakness.  Psychiatric:        Mood and Affect: Mood normal.        Behavior: Behavior normal.      ED Treatments / Results  Labs (all labs ordered are listed, but only abnormal results are displayed) Labs Reviewed  CBC - Abnormal; Notable for the following components:      Result Value   RBC 3.52 (*)  Hemoglobin 11.5 (*)    HCT 34.3 (*)    All other components within normal limits  BASIC METABOLIC PANEL - Abnormal; Notable for the  following components:   Sodium 129 (*)    Chloride 89 (*)    Glucose, Bld 104 (*)    BUN 7 (*)    All other components within normal limits  MAGNESIUM - Abnormal; Notable for the following components:   Magnesium 1.6 (*)    All other components within normal limits  NOVEL CORONAVIRUS, NAA (HOSP ORDER, SEND-OUT TO REF LAB; TAT 18-24 HRS)  TSH  POC SARS CORONAVIRUS 2 AG -  ED    EKG EKG Interpretation  Date/Time:  Monday May 20 2019 13:28:46 EST Ventricular Rate:  109 PR Interval:  124 QRS Duration: 72 QT Interval:  300 QTC Calculation: 404 R Axis:   50 Text Interpretation: Sinus tachycardia Otherwise normal ECG Since last tracing rate faster Confirmed by Dorie Rank [phone]) on 05/20/2019 2:39:02 PM   Radiology Dg Chest 2 View  Result Date: 05/20/2019 CLINICAL DATA:  Cough. Additional history provided: Chest pain and shortness of breath for several days. EXAM: CHEST - 2 VIEW COMPARISON:  Chest radiograph 02/26/2019 FINDINGS: As before, there is rightward shift of mediastinal structures secondary to previous right upper lobectomy. Heart size within normal limits.  Aortic atherosclerosis. Similar appearance of pleuroparenchymal scarring at the base of the right hemithorax as compared to prior chest radiographs 02/26/2019. No convincing superimposed airspace disease. The left lung is clear. No evidence of pneumothorax or sizable pleural effusion. No acute bony abnormality. IMPRESSION: Similar appearance of pleuroparenchymal scarring at the base of the right hemithorax as compared to prior examination 02/26/2019. No convincing superimposed airspace disease. Left lung clear. Sequela of prior right upper lobectomy. Aortic atherosclerosis. Electronically Signed   By: Kellie Simmering DO   On: 05/20/2019 13:59   Dg Pelvis 1-2 Views  Result Date: 05/20/2019 CLINICAL DATA:  Fall. EXAM: PELVIS - 1-2 VIEW COMPARISON:  May 28, 2018. FINDINGS: There appears to be severe deformity of the right  femoral head consistent with advanced avascular necrosis, which is significantly worse compared to prior exam. No acute fracture or dislocation is noted. There appears to be severe subchondral collapse of the femoral head. Left hip appears normal. Sacroiliac joints are unremarkable. IMPRESSION: Severe deformity of the right femoral head is noted consistent with advanced avascular necrosis resulting in subchondral collapse of the femoral head, which is significantly worse compared to prior exam. No acute fracture or dislocation is noted. Electronically Signed   By: Marijo Conception M.D.   On: 05/20/2019 16:02   Dg Knee 1-2 Views Right  Result Date: 05/20/2019 CLINICAL DATA:  Fall. EXAM: RIGHT KNEE - 1-2 VIEW COMPARISON:  02/16/2018. FINDINGS: Tricompartment degenerative change. Degenerative changes most prominent about the medial compartment. Punctate density noted adjacent to the medial femoral condyle possibly related to old ligamentous injury. No acute bony abnormality identified. No evidence of fracture or dislocation. Peripheral vascular calcification. IMPRESSION: 1. Tricompartment degenerative change. Degenerative changes most prominent about the medial compartment. No acute abnormality identified. 2.  Peripheral vascular disease. Electronically Signed   By: Marcello Moores  Register   On: 05/20/2019 15:57    Procedures Procedures (including critical care time)  Medications Ordered in ED Medications  acetaminophen (TYLENOL) tablet 650 mg (650 mg Oral Given 05/20/19 1545)  ipratropium-albuterol (DUONEB) 0.5-2.5 (3) MG/3ML nebulizer solution 3 mL (3 mLs Nebulization Given 05/20/19 1537)  predniSONE (DELTASONE) tablet 60 mg (60 mg  Oral Given 05/20/19 1545)  magnesium sulfate IVPB 2 g 50 mL (0 g Intravenous Stopped 05/20/19 1955)     Initial Impression / Assessment and Plan / ED Course  I have reviewed the triage vital signs and the nursing notes.  Pertinent labs & imaging results that were available  during my care of the patient were reviewed by me and considered in my medical decision making (see chart for details).        On arrival, febrile, mildly tachycardic, HDS. Given tylenol for fever and body soreness.   From a respiratory standpoint, considered: Viral URI, COVID-19, pneumonia, COPD exacerbation  Chest x-ray: No acute findings  EKG shows sinus tachycardia no obvious ischemic findings, poor baseline tracing  From a trauma standpoint, patient has superficial skin tear of her right knee.  Patient is able to bend and straighten her knee and hip but states it causes soreness.  Patient did not hit her head or lose consciousness.  No gross neuro deficits.  Patient is on anticoagulation.  Low suspicion for any intracranial pathology.  Abdomen is soft and benign.  X-ray pelvis: "Severe deformity of the right femoral head is noted consistent with advanced avascular necrosis resulting in subchondral collapse of the femoral head, which is significantly worse compared to prior exam. No acute fracture or dislocation is noted."  Pt given duonebs and steroids. Upon reassessment, patient endorses feeling much better.  Patient has improved lung examination to auscultation and is in no acute respiratory distress and remained stable on her home 3 L oxygen.  Patient reports she feels well would like to go home.  Initial Covid negative, will obtain outpatient Covid swab.  Patient encouraged to stay home and isolate and endorses understanding.  Will prescribe 4-day course of prednisone for COPD exacerbation. Encouraged to increase use of inhalers at home.   Patient was also updated on worsening of her avascular necrosis and encouraged to follow closely with her regular doctor.  Stable for discharge at this time.  Strict return precautions given.  Patient endorses understanding.  Final Clinical Impressions(s) / ED Diagnoses   Final diagnoses:  Viral upper respiratory tract infection  COPD  exacerbation [facility])    ED Discharge Orders         Ordered    predniSONE (DELTASONE) 10 MG tablet  Daily     05/20/19 1959           Burns Spain, MD 05/21/19 0121    Tegeler, Gwenyth Allegra, MD 05/21/19 [phone]

## 2019-05-22 ENCOUNTER — Telehealth: Payer: Self-pay | Admitting: Internal Medicine

## 2019-05-22 ENCOUNTER — Encounter: Payer: Self-pay | Admitting: Internal Medicine

## 2019-05-22 ENCOUNTER — Ambulatory Visit (INDEPENDENT_AMBULATORY_CARE_PROVIDER_SITE_OTHER): Payer: PPO | Admitting: Internal Medicine

## 2019-05-22 DIAGNOSIS — R3981 Functional urinary incontinence: Secondary | ICD-10-CM

## 2019-05-22 DIAGNOSIS — R32 Unspecified urinary incontinence: Secondary | ICD-10-CM | POA: Diagnosis not present

## 2019-05-22 DIAGNOSIS — J441 Chronic obstructive pulmonary disease with (acute) exacerbation: Secondary | ICD-10-CM | POA: Insufficient documentation

## 2019-05-22 DIAGNOSIS — T148XXA Other injury of unspecified body region, initial encounter: Secondary | ICD-10-CM

## 2019-05-22 DIAGNOSIS — R296 Repeated falls: Secondary | ICD-10-CM

## 2019-05-22 MED ORDER — AZITHROMYCIN 250 MG PO TABS: ORAL_TABLET | ORAL | 0 refills | Status: DC

## 2019-05-22 NOTE — Telephone Encounter (Signed)
FYI. Has virtual visit with you today

## 2019-05-22 NOTE — Assessment & Plan Note (Signed)
Imbalance Will not be using a walker at this time We will use wheelchair

## 2019-05-22 NOTE — Assessment & Plan Note (Signed)
COPD with acute exacerbation Chest x-ray stable in ED 2 days ago Experiencing increased wheezing and increased cough with sputum production, which is not normal for her Low-grade fever 3 days ago Prescribed prednisone in the ED-has 2 days left-we will complete Stressed using her inhalers on a regular schedule Will start a Z-Pak If her symptoms do not improve or worsen he will call myself or pulmonary

## 2019-05-22 NOTE — Assessment & Plan Note (Signed)
Multiple skin tears from recent falls Has wound care coming tomorrow for previous skin tear, which is healed Will need to extend the wound care-we will order Family currently doing wound care

## 2019-05-22 NOTE — Progress Notes (Signed)
Virtual Visit via Video Note  I connected with [name] on 05/22/19 at  2:30 PM EST by a video enabled telemedicine application and verified that I am speaking with the correct person using two identifiers.   I discussed the limitations of evaluation and management by telemedicine and the availability of in person appointments. The patient expressed understanding and agreed to proceed.  Present for the visit:  Myself, Dr Billey Gosling, Veva Holes and her daughter [name]..  The patient and her daughter are currently at the patient's home and I am in the office.    No referring provider.    History of Present Illness: This is an acute visit for recent fall, COPD exacerbation.  2 days ago she fell twice.  Once earlier in the day and then again at night.  EMS came the first time and picked her up from the floor and put her back in bed.  There were no major injuries.  She had just slid off her bed.  After the fall the second time she was taken to the emergency room.  She was a little disorientated-she was not using her rescue inhaler properly.  She feels this was because she had not had her oxygen on for a while.  Evaluation in the emergency room showed that her chest x-ray was stable, x-ray showed no fractures, blood work stable and EKG without acute changes.  Covid quick test was negative.  She did receive a breathing treatment and was diagnosed with a COPD flare.  She was given a dose of prednisone in the emergency room and sent home with 4 additional days of prednisone.  He is still having a lot of wheezing, has generalized weakness and feels sore from the fall.  He has not been eating well.  The past couple of days she has not been able to continue her inhalers as regularly as she should and usually does because of everything going on.  They before he fell he did have a fever of approximately 100.2.  Creatinine increased cough and is coughing up phlegm and typically he does not cough up  anything.  He denies any changes in her chronic shortness of breath.  He has been eating more.  He denies any chest pain, headaches or lightheadedness.  Wound: Following: The wound on her leg from her previous fall has healed.  She has wound care coming 1 more time tomorrow to evaluate this, but they feel she will need additional wound care for her new wounds.  She has wound on the right forearm that is a skin tear from the IV when she was in the hospital, left neck bruise, gash and other wounds on her arms and legs.  Her family is doing local wound care.  Difficulty ambulating: Due to her poor balance and weakness she is not able to walk with a walker at this time.  They do have a wheelchair and she will be using that to ambulate.  Urinary incontinence at night: She does experience urinary continence-mostly because she is not able to make it to the bathroom in time.  She has been using depends, but these are not sufficient at nighttime.  Her daughter thinks she needs to start using adult diapers.       Social History   Socioeconomic History  . Marital status: Married    Spouse name: Not on file  . Number of children: 2  . Years of education: 30  . Highest education level: High  school graduate  Occupational History  . Occupation: Retired Animator, Psychiatrist: RETIRED  Social Needs  . Financial resource strain: Not on file  . Food insecurity    Worry: Not on file    Inability: Not on file  . Transportation needs    Medical: Not on file    Non-medical: Not on file  Tobacco Use  . Smoking status: Former Smoker    Packs/day: 1.00    Years: 42.00    Pack years: 42.00    Types: Cigarettes    Quit date: 09/22/2003    Years since quitting: 15.6  . Smokeless tobacco: Never Used  Substance and Sexual Activity  . Alcohol use: Yes    Alcohol/week: 14.0 standard drinks    Types: 14 Glasses of wine per week    Comment: 12/22/2014 "I have a large glass of wine qd"  .  Drug use: No  . Sexual activity: Never  Lifestyle  . Physical activity    Days per week: Not on file    Minutes per session: Not on file  . Stress: Not on file  Relationships  . Social Herbalist on phone: Not on file    Gets together: Not on file    Attends religious service: Not on file    Active member of club or organization: Not on file    Attends meetings of clubs or organizations: Not on file    Relationship status: Not on file  Other Topics Concern  . Not on file  Social History Narrative   Lives with husband in a one story home.  Has 2 children.  Retired.  Education: high school.      Observations/Objective: Appears well in NAD Wearing oxygen via nasal cannula No respiratory distress.  Does not appear to be short of breath.  No audible wheezing Skin appears warm and dry Skin care on right forearm without active bleeding or discharge New bruising on arms and leg-could not review all of her wounds, bruises  Assessment and Plan:  See Problem List for Assessment and Plan of chronic medical problems.   Follow Up Instructions:    I discussed the assessment and treatment plan with the patient. The patient was provided an opportunity to ask questions and all were answered. The patient agreed with the plan and demonstrated an understanding of the instructions.   The patient was advised to call back or seek an in-person evaluation if the symptoms worsen or if the condition fails to improve as anticipated.    Binnie Rail, MD

## 2019-05-22 NOTE — Telephone Encounter (Signed)
Called and spoke with patient's daughter, [name], to schedule a virtual visit for today with Dr Quay Burow. Scheduled for today at 2:30pm.

## 2019-05-22 NOTE — Assessment & Plan Note (Signed)
Incontinence due to immobility Needs adult diapers

## 2019-05-22 NOTE — Telephone Encounter (Signed)
Copied from [place] [id number]. Topic: General - Other >> May 22, 2019  8:26 AM Keene Breath wrote: Reason for CRM: Patient's daughter is calling to inform the doctor of multiple falls the patient has had on Monday and last night.  Daughter would like to discuss a virtual appt. And home health care.  Please advise and call daughter to discuss at [phone].  Patient is getting weaker and is not eating much.

## 2019-05-23 ENCOUNTER — Telehealth: Payer: Self-pay

## 2019-05-23 DIAGNOSIS — Z9981 Dependence on supplemental oxygen: Secondary | ICD-10-CM | POA: Diagnosis not present

## 2019-05-23 DIAGNOSIS — M1711 Unilateral primary osteoarthritis, right knee: Secondary | ICD-10-CM | POA: Diagnosis not present

## 2019-05-23 DIAGNOSIS — F3289 Other specified depressive episodes: Secondary | ICD-10-CM | POA: Diagnosis not present

## 2019-05-23 DIAGNOSIS — G8929 Other chronic pain: Secondary | ICD-10-CM | POA: Diagnosis not present

## 2019-05-23 DIAGNOSIS — Z87891 Personal history of nicotine dependence: Secondary | ICD-10-CM | POA: Diagnosis not present

## 2019-05-23 DIAGNOSIS — S81802D Unspecified open wound, left lower leg, subsequent encounter: Secondary | ICD-10-CM | POA: Diagnosis not present

## 2019-05-23 DIAGNOSIS — Z9181 History of falling: Secondary | ICD-10-CM | POA: Diagnosis not present

## 2019-05-23 DIAGNOSIS — M5030 Other cervical disc degeneration, unspecified cervical region: Secondary | ICD-10-CM | POA: Diagnosis not present

## 2019-05-23 DIAGNOSIS — E785 Hyperlipidemia, unspecified: Secondary | ICD-10-CM | POA: Diagnosis not present

## 2019-05-23 DIAGNOSIS — N312 Flaccid neuropathic bladder, not elsewhere classified: Secondary | ICD-10-CM | POA: Diagnosis not present

## 2019-05-23 DIAGNOSIS — E038 Other specified hypothyroidism: Secondary | ICD-10-CM | POA: Diagnosis not present

## 2019-05-23 DIAGNOSIS — M8580 Other specified disorders of bone density and structure, unspecified site: Secondary | ICD-10-CM | POA: Diagnosis not present

## 2019-05-23 DIAGNOSIS — F419 Anxiety disorder, unspecified: Secondary | ICD-10-CM | POA: Diagnosis not present

## 2019-05-23 DIAGNOSIS — J9611 Chronic respiratory failure with hypoxia: Secondary | ICD-10-CM | POA: Diagnosis not present

## 2019-05-23 DIAGNOSIS — C3490 Malignant neoplasm of unspecified part of unspecified bronchus or lung: Secondary | ICD-10-CM | POA: Diagnosis not present

## 2019-05-23 DIAGNOSIS — M47816 Spondylosis without myelopathy or radiculopathy, lumbar region: Secondary | ICD-10-CM | POA: Diagnosis not present

## 2019-05-23 DIAGNOSIS — M1611 Unilateral primary osteoarthritis, right hip: Secondary | ICD-10-CM | POA: Diagnosis not present

## 2019-05-23 DIAGNOSIS — J449 Chronic obstructive pulmonary disease, unspecified: Secondary | ICD-10-CM | POA: Diagnosis not present

## 2019-05-23 DIAGNOSIS — G629 Polyneuropathy, unspecified: Secondary | ICD-10-CM | POA: Diagnosis not present

## 2019-05-23 NOTE — Telephone Encounter (Signed)
Copied from [place] [phone]. Topic: General - Other >> May 22, 2019  4:43 PM Oneta Rack wrote: Patient was with someone on Thanksgiving that was recently tested positive and  was informed today.   patient was tested on Monday twice while in ER 06/19/19. Daughter would like to know if patient should be retested , please advise

## 2019-05-23 NOTE — Telephone Encounter (Signed)
Most likely she is okay, but given her current symptoms I think it is better to be safe and have her get retested.

## 2019-05-24 ENCOUNTER — Telehealth: Payer: Self-pay | Admitting: Internal Medicine

## 2019-05-24 NOTE — Telephone Encounter (Signed)
Patient's daughter called in to inform PCP that patient did test positive for COVID-19.

## 2019-05-24 NOTE — Telephone Encounter (Signed)
Called spoke with daughter.  She wanted to let Dr. Annamaria Boots know patient tested positive. And if he had any recommendations to care for the patient.   Dr. Annamaria Boots please advise.   Allergies  Allergen Reactions  . Ampicillin Diarrhea and Nausea Only    GI upset  . Azithromycin Diarrhea and Nausea And Vomiting  . Codeine Nausea And Vomiting and Other (See Comments)    Makes her "crazy"  . Daliresp [Roflumilast] Nausea Only  . Nitrofurantoin Diarrhea and Nausea Only  . Other Other (See Comments)    No nuts and seeds because of diverticulitis  . Penicillins Nausea And Vomiting, Swelling and Rash    Has patient had a PCN reaction causing immediate rash, facial/tongue/throat swelling, SOB or lightheadedness with hypotension: Yes Has patient had a PCN reaction causing severe rash involving mucus membranes or skin necrosis: No Has patient had a PCN reaction that required hospitalization No Has patient had a PCN reaction occurring within the last 10 years: No No "cillins" If all of the above answers are "NO", then may proceed with Cephalosporin use.   . Sulfonamide Derivatives Diarrhea and Nausea And Vomiting  . Tiotropium Bromide Monohydrate     Urinary retention  . Doxycycline Nausea Only  . Fludrocortisone Acetate Itching  . Lactose Intolerance (Gi) Other (See Comments)    Gas and bloating   . Latex Itching and Rash  . Prednisone Other (See Comments)    Insomnia after 2 days   Current Outpatient Medications on File Prior to Visit  Medication Sig Dispense Refill  . acetaminophen (TYLENOL ARTHRITIS PAIN) 650 MG CR tablet Take 650 mg by mouth daily as needed for pain.     [name] ALPRAZolam (XANAX) 1 MG tablet TAKE 1 TABLET BY MOUTH EVERYDAY AT BEDTIME 30 tablet 1  . atorvastatin (LIPITOR) 20 MG tablet TAKE 1 TABLET (20 MG TOTAL) BY MOUTH DAILY AT 6 PM. 90 tablet 1  . azithromycin (ZITHROMAX) 250 MG tablet Take two tabs the first day and then one tab daily for four days 6 tablet 0  .  Cholecalciferol (VITAMIN D) 2000 UNITS tablet Take 1 tablet (2,000 Units total) by mouth daily. 30 tablet 11  . FLOVENT HFA 110 MCG/ACT inhaler TAKE 2 PUFFS BY MOUTH TWICE A DAY 12 g 5  . levalbuterol (XOPENEX) 1.25 MG/3ML nebulizer solution Take 1.25 mg by nebulization every 6 (six) hours as needed for wheezing. DX: Bronchiectasis 360 mL 3  . levothyroxine (SYNTHROID) 88 MCG tablet TAKE 1 TABLET BY MOUTH EVERY DAY 90 tablet 0  . Multiple Vitamins-Minerals (CENTRUM SILVER PO) Take 1 tablet by mouth daily.     . ondansetron (ZOFRAN ODT) 4 MG disintegrating tablet Take 1 tablet (4 mg total) by mouth every 8 (eight) hours as needed for nausea or vomiting. 30 tablet 0  . predniSONE (DELTASONE) 10 MG tablet Take 2 tablets (20 mg total) by mouth daily for 4 days. 8 tablet 0  . Probiotic Product (PROBIOTIC PO) Take 1 capsule by mouth daily.    [name] Respiratory Therapy Supplies (FLUTTER) DEVI Blow through 4 times per cycle and repeat 3 cycles per day 1 each 0  . Respiratory Therapy Supplies (NEBULIZER/TUBING/MOUTHPIECE) KIT As directed 3 each 12  . silver sulfADIAZINE (SILVADENE) 1 % cream Apply 1 application topically daily. 50 g 0   No current facility-administered medications on file prior to visit.

## 2019-05-24 NOTE — Telephone Encounter (Signed)
Called and spoke with pt's daughter letting her know the info stated by CY. Cindy verbalized understanding. Nothing further needed.

## 2019-05-24 NOTE — Telephone Encounter (Signed)
[phone] daughter, [name] returned Emily's call

## 2019-05-24 NOTE — Telephone Encounter (Signed)
Pt was seen in ED 05/20/2019. Pt was tested for covid and results came back positive.  Attempted to call pt's daughter [name] but unable to reach. Left message for her to return call.

## 2019-05-24 NOTE — Telephone Encounter (Signed)
Spoke with daughter and let her know response below.

## 2019-05-24 NOTE — Telephone Encounter (Signed)
Sorry she was positive for Covid. There is no therapy at home that directly treats this infection. She should rest, avoid infecting others, stay comfortably hydrated. Ok to manage symptoms like headache, cough and stuffy nose with otc cold and flu type remedies as needed It may help to take Vitamin D3, 2000 units, once daily (otc)

## 2019-05-24 NOTE — Telephone Encounter (Signed)
Home Health Verbal Orders - Caller/Agency:  Debbie Wells/ [place] Number: [phone] Requesting OT/PT/Skilled Nursing/Social Work/Speech Therapy: skilled nursing Frequency: 2x a week for "a few weeks for wound"

## 2019-05-24 NOTE — Telephone Encounter (Signed)
LVM letting Debbie know ok for orders per Dr. Quay Burow.

## 2019-05-25 ENCOUNTER — Telehealth: Payer: Self-pay | Admitting: Unknown Physician Specialty

## 2019-05-25 ENCOUNTER — Other Ambulatory Visit: Payer: Self-pay | Admitting: Unknown Physician Specialty

## 2019-05-25 DIAGNOSIS — U071 COVID-19: Secondary | ICD-10-CM

## 2019-05-25 LAB — NOVEL CORONAVIRUS, NAA (HOSP ORDER, SEND-OUT TO REF LAB; TAT 18-24 HRS): SARS-CoV-2, NAA: DETECTED — AB

## 2019-05-25 NOTE — Telephone Encounter (Signed)
Pt has had symptoms for 1 week.  Discussed with patient and her daughter about Covid symptoms and the use of bamlanivimab, a monoclonal antibody infusion for those with mild to moderate Covid symptoms and at a high risk of hospitalization.  Pt is qualified for this infusion at the [facility] infusion center due to age and COPD which were addressed with the patient and are actively being managed by a [facility] provider.    After discussing the infusion's costs, potential benefits and side effects, the daughter has decided for treatment with monoclonal antibodies.

## 2019-05-27 ENCOUNTER — Ambulatory Visit (HOSPITAL_COMMUNITY)
Admission: RE | Admit: 2019-05-27 | Discharge: 2019-05-27 | Disposition: A | Discharge status: Home / Self Care | Payer: Medicare Other | Source: Ambulatory Visit | Attending: Critical Care Medicine | Admitting: Critical Care Medicine

## 2019-05-27 DIAGNOSIS — U071 COVID-19: Secondary | ICD-10-CM | POA: Insufficient documentation

## 2019-05-27 DIAGNOSIS — Z23 Encounter for immunization: Secondary | ICD-10-CM | POA: Diagnosis present

## 2019-05-27 MED ORDER — EPINEPHRINE 0.3 MG/0.3ML IJ SOAJ: 0.3000 mg | Freq: Once | INTRAMUSCULAR | Status: DC | PRN

## 2019-05-27 MED ORDER — SODIUM CHLORIDE 0.9 % IV SOLN: INTRAVENOUS | Status: DC | PRN

## 2019-05-27 MED ORDER — SODIUM CHLORIDE 0.9 % IV SOLN
700.0000 mg | Freq: Once | INTRAVENOUS | Status: AC
  Administered 2019-05-27: 700 mg via INTRAVENOUS
  Filled 2019-05-27: qty 20

## 2019-05-27 MED ORDER — DIPHENHYDRAMINE HCL 50 MG/ML IJ SOLN: 50.0000 mg | Freq: Once | INTRAMUSCULAR | Status: DC | PRN

## 2019-05-27 MED ORDER — FAMOTIDINE IN NACL 20-0.9 MG/50ML-% IV SOLN: 20.0000 mg | Freq: Once | INTRAVENOUS | Status: DC | PRN

## 2019-05-27 MED ORDER — METHYLPREDNISOLONE SODIUM SUCC 125 MG IJ SOLR: 125.0000 mg | Freq: Once | INTRAMUSCULAR | Status: DC | PRN

## 2019-05-27 MED ORDER — ALBUTEROL SULFATE HFA 108 (90 BASE) MCG/ACT IN AERS: 2.0000 | INHALATION_SPRAY | Freq: Once | RESPIRATORY_TRACT | Status: DC | PRN

## 2019-05-27 NOTE — Progress Notes (Signed)
  Diagnosis: COVID-19    Procedure: Covid Infusion Clinic Med: bamlanivimab infusion - Provided patient with bamlanimivab fact sheet for patients, parents and caregivers prior to infusion.  Complications: No immediate complications noted.  Discharge: Discharged home   [name] 05/27/2019

## 2019-05-30 DIAGNOSIS — S81001D Unspecified open wound, right knee, subsequent encounter: Secondary | ICD-10-CM | POA: Diagnosis not present

## 2019-05-30 DIAGNOSIS — S51812D Laceration without foreign body of left forearm, subsequent encounter: Secondary | ICD-10-CM | POA: Diagnosis not present

## 2019-06-01 DIAGNOSIS — J449 Chronic obstructive pulmonary disease, unspecified: Secondary | ICD-10-CM | POA: Diagnosis not present

## 2019-06-03 ENCOUNTER — Telehealth: Payer: Self-pay

## 2019-06-03 NOTE — Telephone Encounter (Signed)
Daughter has dropped off forms. She is requesting leave starting 12/28 to 2/8. Forms have been completed & Placed in providers box to review and sign.

## 2019-06-03 NOTE — Telephone Encounter (Signed)
She cannot take calcium-600 mg twice daily with food.  There may be some in her multivitamin if she is still taking that-if there is then she only needs to supplement with 600 mg of calcium once a day.  I am not sure if this will improve her strength or not, but okay to try

## 2019-06-03 NOTE — Telephone Encounter (Signed)
Copied from [place] [id number]. Topic: General - Inquiry >> Jun 03, 2019  9:44 AM Richardo Priest, NT wrote: Reason for CRM: Pt's daughter called in stating she would like to ask PCP if pt should take calcium supplements to help improve strength. Pt's daughter also is going to be dropping off paperwork for FMLA to have filled out by 21st. Please advise.

## 2019-06-03 NOTE — Telephone Encounter (Signed)
Please advise on calcium question.

## 2019-06-04 ENCOUNTER — Other Ambulatory Visit: Payer: Self-pay | Admitting: Internal Medicine

## 2019-06-04 DIAGNOSIS — Z0279 Encounter for issue of other medical certificate: Secondary | ICD-10-CM

## 2019-06-04 NOTE — Telephone Encounter (Signed)
Daughter has been informed of the note below.   Forms have been signed, Faxed, sent to scan&Charged for.   Daughter informed as well of that and original has been mailed to her.

## 2019-06-05 DIAGNOSIS — G629 Polyneuropathy, unspecified: Secondary | ICD-10-CM | POA: Diagnosis not present

## 2019-06-05 DIAGNOSIS — M1611 Unilateral primary osteoarthritis, right hip: Secondary | ICD-10-CM | POA: Diagnosis not present

## 2019-06-05 DIAGNOSIS — M5030 Other cervical disc degeneration, unspecified cervical region: Secondary | ICD-10-CM | POA: Diagnosis not present

## 2019-06-05 DIAGNOSIS — G8929 Other chronic pain: Secondary | ICD-10-CM | POA: Diagnosis not present

## 2019-06-05 DIAGNOSIS — Z9181 History of falling: Secondary | ICD-10-CM | POA: Diagnosis not present

## 2019-06-05 DIAGNOSIS — C3490 Malignant neoplasm of unspecified part of unspecified bronchus or lung: Secondary | ICD-10-CM | POA: Diagnosis not present

## 2019-06-05 DIAGNOSIS — J9611 Chronic respiratory failure with hypoxia: Secondary | ICD-10-CM | POA: Diagnosis not present

## 2019-06-05 DIAGNOSIS — F419 Anxiety disorder, unspecified: Secondary | ICD-10-CM | POA: Diagnosis not present

## 2019-06-05 DIAGNOSIS — Z87891 Personal history of nicotine dependence: Secondary | ICD-10-CM | POA: Diagnosis not present

## 2019-06-05 DIAGNOSIS — N312 Flaccid neuropathic bladder, not elsewhere classified: Secondary | ICD-10-CM | POA: Diagnosis not present

## 2019-06-05 DIAGNOSIS — M47816 Spondylosis without myelopathy or radiculopathy, lumbar region: Secondary | ICD-10-CM | POA: Diagnosis not present

## 2019-06-05 DIAGNOSIS — J449 Chronic obstructive pulmonary disease, unspecified: Secondary | ICD-10-CM | POA: Diagnosis not present

## 2019-06-05 DIAGNOSIS — F3289 Other specified depressive episodes: Secondary | ICD-10-CM | POA: Diagnosis not present

## 2019-06-05 DIAGNOSIS — M8580 Other specified disorders of bone density and structure, unspecified site: Secondary | ICD-10-CM | POA: Diagnosis not present

## 2019-06-05 DIAGNOSIS — E785 Hyperlipidemia, unspecified: Secondary | ICD-10-CM | POA: Diagnosis not present

## 2019-06-05 DIAGNOSIS — S81802D Unspecified open wound, left lower leg, subsequent encounter: Secondary | ICD-10-CM | POA: Diagnosis not present

## 2019-06-05 DIAGNOSIS — E038 Other specified hypothyroidism: Secondary | ICD-10-CM | POA: Diagnosis not present

## 2019-06-05 DIAGNOSIS — Z9981 Dependence on supplemental oxygen: Secondary | ICD-10-CM | POA: Diagnosis not present

## 2019-06-05 DIAGNOSIS — M1711 Unilateral primary osteoarthritis, right knee: Secondary | ICD-10-CM | POA: Diagnosis not present

## 2019-06-13 ENCOUNTER — Other Ambulatory Visit: Payer: Self-pay | Admitting: Internal Medicine

## 2019-06-17 DIAGNOSIS — J9611 Chronic respiratory failure with hypoxia: Secondary | ICD-10-CM | POA: Diagnosis not present

## 2019-06-17 DIAGNOSIS — J449 Chronic obstructive pulmonary disease, unspecified: Secondary | ICD-10-CM | POA: Diagnosis not present

## 2019-06-17 NOTE — Telephone Encounter (Signed)
[place] Controlled Database Checked Last filled: 05/13/19 # 30 LOV wPCP: 05/03/19 Next appt: 10/15/19 Please advise in PCP's absence. Thanks

## 2019-06-19 ENCOUNTER — Telehealth: Payer: Self-pay | Admitting: Internal Medicine

## 2019-06-19 ENCOUNTER — Other Ambulatory Visit: Payer: Self-pay | Admitting: Internal Medicine

## 2019-06-19 DIAGNOSIS — Z9981 Dependence on supplemental oxygen: Secondary | ICD-10-CM | POA: Diagnosis not present

## 2019-06-19 DIAGNOSIS — M1711 Unilateral primary osteoarthritis, right knee: Secondary | ICD-10-CM | POA: Diagnosis not present

## 2019-06-19 DIAGNOSIS — C3490 Malignant neoplasm of unspecified part of unspecified bronchus or lung: Secondary | ICD-10-CM | POA: Diagnosis not present

## 2019-06-19 DIAGNOSIS — M5030 Other cervical disc degeneration, unspecified cervical region: Secondary | ICD-10-CM | POA: Diagnosis not present

## 2019-06-19 DIAGNOSIS — G8929 Other chronic pain: Secondary | ICD-10-CM | POA: Diagnosis not present

## 2019-06-19 DIAGNOSIS — N312 Flaccid neuropathic bladder, not elsewhere classified: Secondary | ICD-10-CM | POA: Diagnosis not present

## 2019-06-19 DIAGNOSIS — F3289 Other specified depressive episodes: Secondary | ICD-10-CM | POA: Diagnosis not present

## 2019-06-19 DIAGNOSIS — S81802D Unspecified open wound, left lower leg, subsequent encounter: Secondary | ICD-10-CM | POA: Diagnosis not present

## 2019-06-19 DIAGNOSIS — F419 Anxiety disorder, unspecified: Secondary | ICD-10-CM | POA: Diagnosis not present

## 2019-06-19 DIAGNOSIS — E038 Other specified hypothyroidism: Secondary | ICD-10-CM | POA: Diagnosis not present

## 2019-06-19 DIAGNOSIS — Z87891 Personal history of nicotine dependence: Secondary | ICD-10-CM | POA: Diagnosis not present

## 2019-06-19 DIAGNOSIS — J449 Chronic obstructive pulmonary disease, unspecified: Secondary | ICD-10-CM | POA: Diagnosis not present

## 2019-06-19 DIAGNOSIS — M1611 Unilateral primary osteoarthritis, right hip: Secondary | ICD-10-CM | POA: Diagnosis not present

## 2019-06-19 DIAGNOSIS — E785 Hyperlipidemia, unspecified: Secondary | ICD-10-CM | POA: Diagnosis not present

## 2019-06-19 DIAGNOSIS — M47816 Spondylosis without myelopathy or radiculopathy, lumbar region: Secondary | ICD-10-CM | POA: Diagnosis not present

## 2019-06-19 DIAGNOSIS — M8580 Other specified disorders of bone density and structure, unspecified site: Secondary | ICD-10-CM | POA: Diagnosis not present

## 2019-06-19 DIAGNOSIS — G629 Polyneuropathy, unspecified: Secondary | ICD-10-CM | POA: Diagnosis not present

## 2019-06-19 DIAGNOSIS — Z9181 History of falling: Secondary | ICD-10-CM | POA: Diagnosis not present

## 2019-06-19 DIAGNOSIS — J9611 Chronic respiratory failure with hypoxia: Secondary | ICD-10-CM | POA: Diagnosis not present

## 2019-06-19 MED ORDER — PROMETHAZINE HCL 12.5 MG PO TABS: 12.5000 mg | ORAL_TABLET | Freq: Four times a day (QID) | ORAL | 0 refills | Status: DC | PRN

## 2019-06-19 NOTE — Telephone Encounter (Signed)
daughter [name] is concern Mother had tested  Positive for COVID on 11/30 and they thought they were past this but Mother is now very nauseated, last 3 days, not able to eat much at all but so far not vomiting. Pls call back to advise what can be taken for nauesa     [phone] call [facility]

## 2019-06-19 NOTE — Telephone Encounter (Signed)
RX for phenergan sent  [place]

## 2019-06-20 MED ORDER — ONDANSETRON 4 MG PO TBDP: 4.0000 mg | ORAL_TABLET | Freq: Three times a day (TID) | ORAL | 0 refills | Status: DC | PRN

## 2019-06-20 NOTE — Telephone Encounter (Signed)
zofran sent

## 2019-06-27 DIAGNOSIS — M8580 Other specified disorders of bone density and structure, unspecified site: Secondary | ICD-10-CM | POA: Diagnosis not present

## 2019-06-27 DIAGNOSIS — Z87891 Personal history of nicotine dependence: Secondary | ICD-10-CM | POA: Diagnosis not present

## 2019-06-27 DIAGNOSIS — N312 Flaccid neuropathic bladder, not elsewhere classified: Secondary | ICD-10-CM | POA: Diagnosis not present

## 2019-06-27 DIAGNOSIS — E785 Hyperlipidemia, unspecified: Secondary | ICD-10-CM | POA: Diagnosis not present

## 2019-06-27 DIAGNOSIS — M5030 Other cervical disc degeneration, unspecified cervical region: Secondary | ICD-10-CM | POA: Diagnosis not present

## 2019-06-27 DIAGNOSIS — M1611 Unilateral primary osteoarthritis, right hip: Secondary | ICD-10-CM | POA: Diagnosis not present

## 2019-06-27 DIAGNOSIS — Z9981 Dependence on supplemental oxygen: Secondary | ICD-10-CM | POA: Diagnosis not present

## 2019-06-27 DIAGNOSIS — J9611 Chronic respiratory failure with hypoxia: Secondary | ICD-10-CM | POA: Diagnosis not present

## 2019-06-27 DIAGNOSIS — F3289 Other specified depressive episodes: Secondary | ICD-10-CM | POA: Diagnosis not present

## 2019-06-27 DIAGNOSIS — S81802D Unspecified open wound, left lower leg, subsequent encounter: Secondary | ICD-10-CM | POA: Diagnosis not present

## 2019-06-27 DIAGNOSIS — M47816 Spondylosis without myelopathy or radiculopathy, lumbar region: Secondary | ICD-10-CM | POA: Diagnosis not present

## 2019-06-27 DIAGNOSIS — G629 Polyneuropathy, unspecified: Secondary | ICD-10-CM | POA: Diagnosis not present

## 2019-06-27 DIAGNOSIS — C3490 Malignant neoplasm of unspecified part of unspecified bronchus or lung: Secondary | ICD-10-CM | POA: Diagnosis not present

## 2019-06-27 DIAGNOSIS — M1711 Unilateral primary osteoarthritis, right knee: Secondary | ICD-10-CM | POA: Diagnosis not present

## 2019-06-27 DIAGNOSIS — J449 Chronic obstructive pulmonary disease, unspecified: Secondary | ICD-10-CM | POA: Diagnosis not present

## 2019-06-27 DIAGNOSIS — F419 Anxiety disorder, unspecified: Secondary | ICD-10-CM | POA: Diagnosis not present

## 2019-06-27 DIAGNOSIS — E038 Other specified hypothyroidism: Secondary | ICD-10-CM | POA: Diagnosis not present

## 2019-06-27 DIAGNOSIS — G8929 Other chronic pain: Secondary | ICD-10-CM | POA: Diagnosis not present

## 2019-06-27 DIAGNOSIS — Z9181 History of falling: Secondary | ICD-10-CM | POA: Diagnosis not present

## 2019-06-28 ENCOUNTER — Telehealth: Payer: Self-pay

## 2019-06-28 NOTE — Telephone Encounter (Signed)
Patient's daughter, cindy advised of dr burns notes/instructions from [facility]---

## 2019-06-28 NOTE — Telephone Encounter (Signed)
Copied from [place] [id number]. Topic: General - Other >> Jun 28, 2019 10:00 AM Jodie Echevaria wrote: Reason for CRM: Patient daughter Ginger Organ called to get some directions from Dr Quay Burow about her mother getting the COVID vaccination. Per [name] patient is still coughing and think it is due to her recent covid infection and need to know should the patient be retested before getting the vaccination. Please call Condy Dye at Ph# [phone]

## 2019-06-28 NOTE — Telephone Encounter (Signed)
This is what CDC says  -   COVID-19 vaccination should be offered to you regardless of whether you already had COVID-19 infection. You should not be required to have an antibody test before you are vaccinated.  However, anyone currently infected with COVID-19 should wait to get vaccinated until after their illness has resolved and after they have met the criteria to discontinue isolation.  Additionally, current evidence suggests that reinfection with the virus that causes COVID-19 is uncommon in the 90 days after initial infection. Therefore, people with a recent infection may delay vaccination until the end of that 90-day period if desired.

## 2019-07-01 IMAGING — DX DG CERVICAL SPINE 2 OR 3 VIEWS
3 series · 3 of 3 positions shown · non-contrast
Comparison: 10/19/2011

CLINICAL DATA: Posterior neck pain for 7 days, no known injury

EXAM:
CERVICAL SPINE - 2-3 VIEW

[c-spine lat]
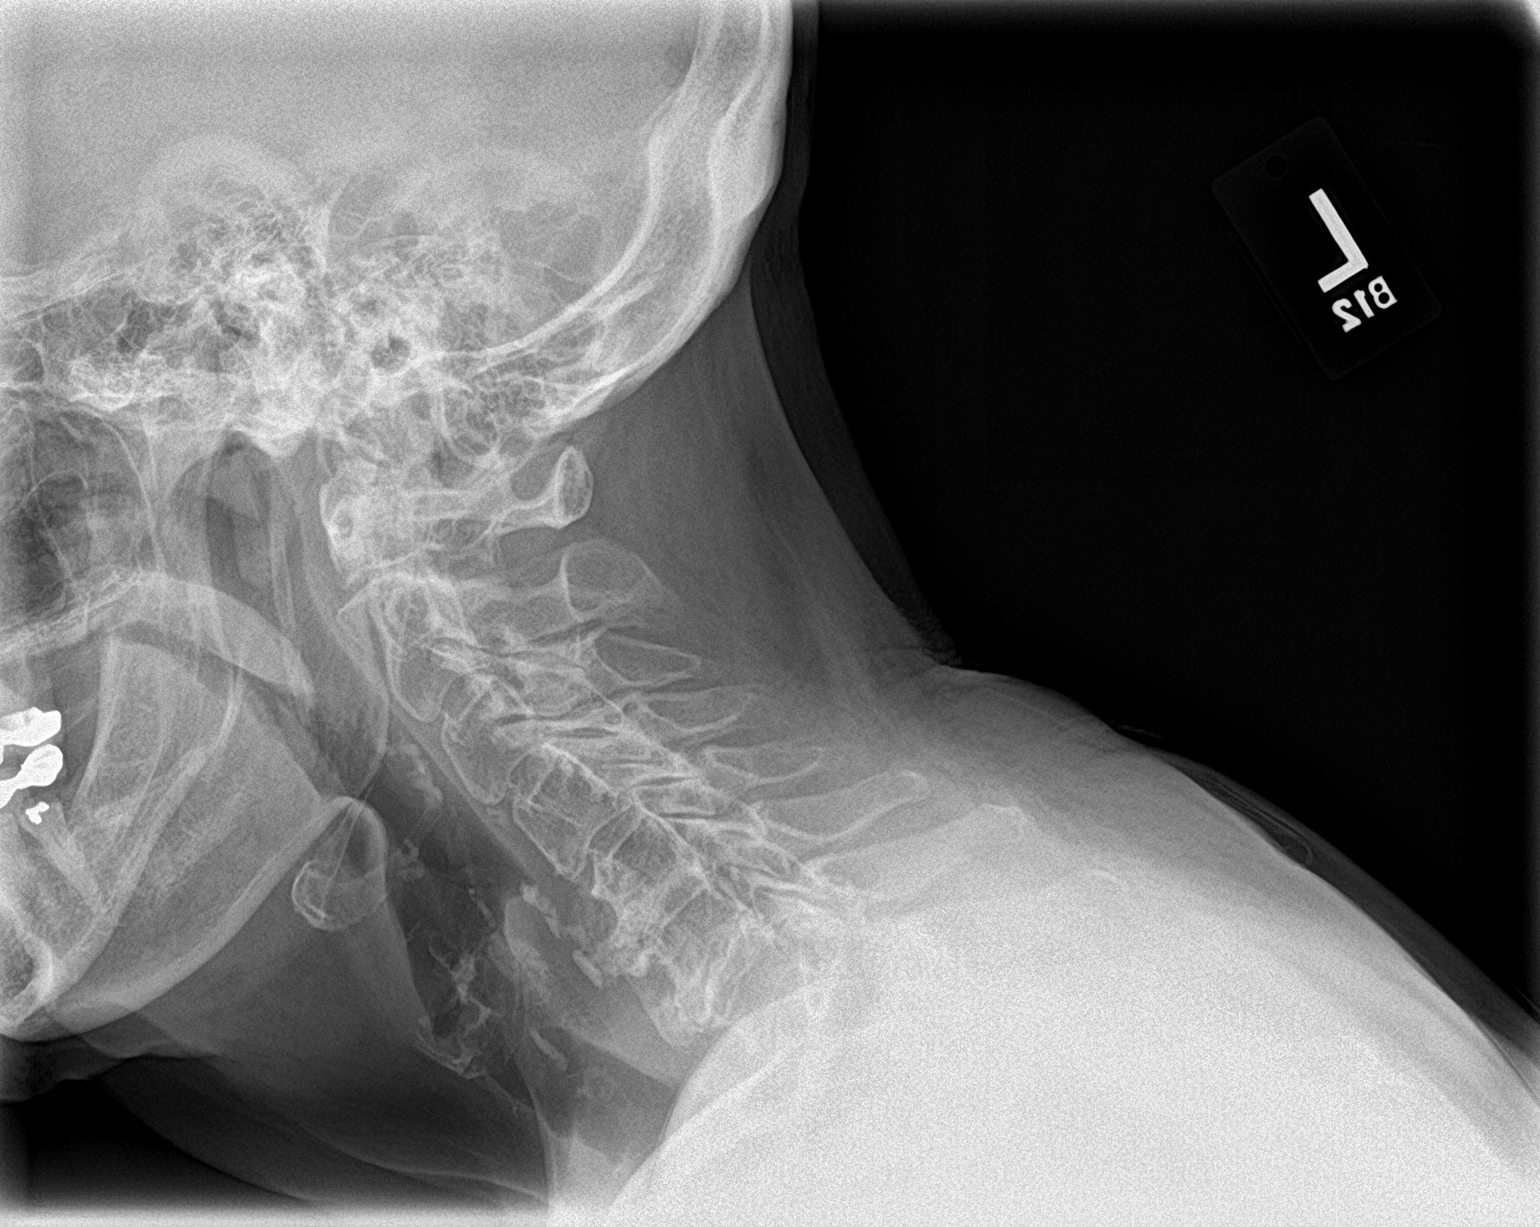

[c-spine ap]
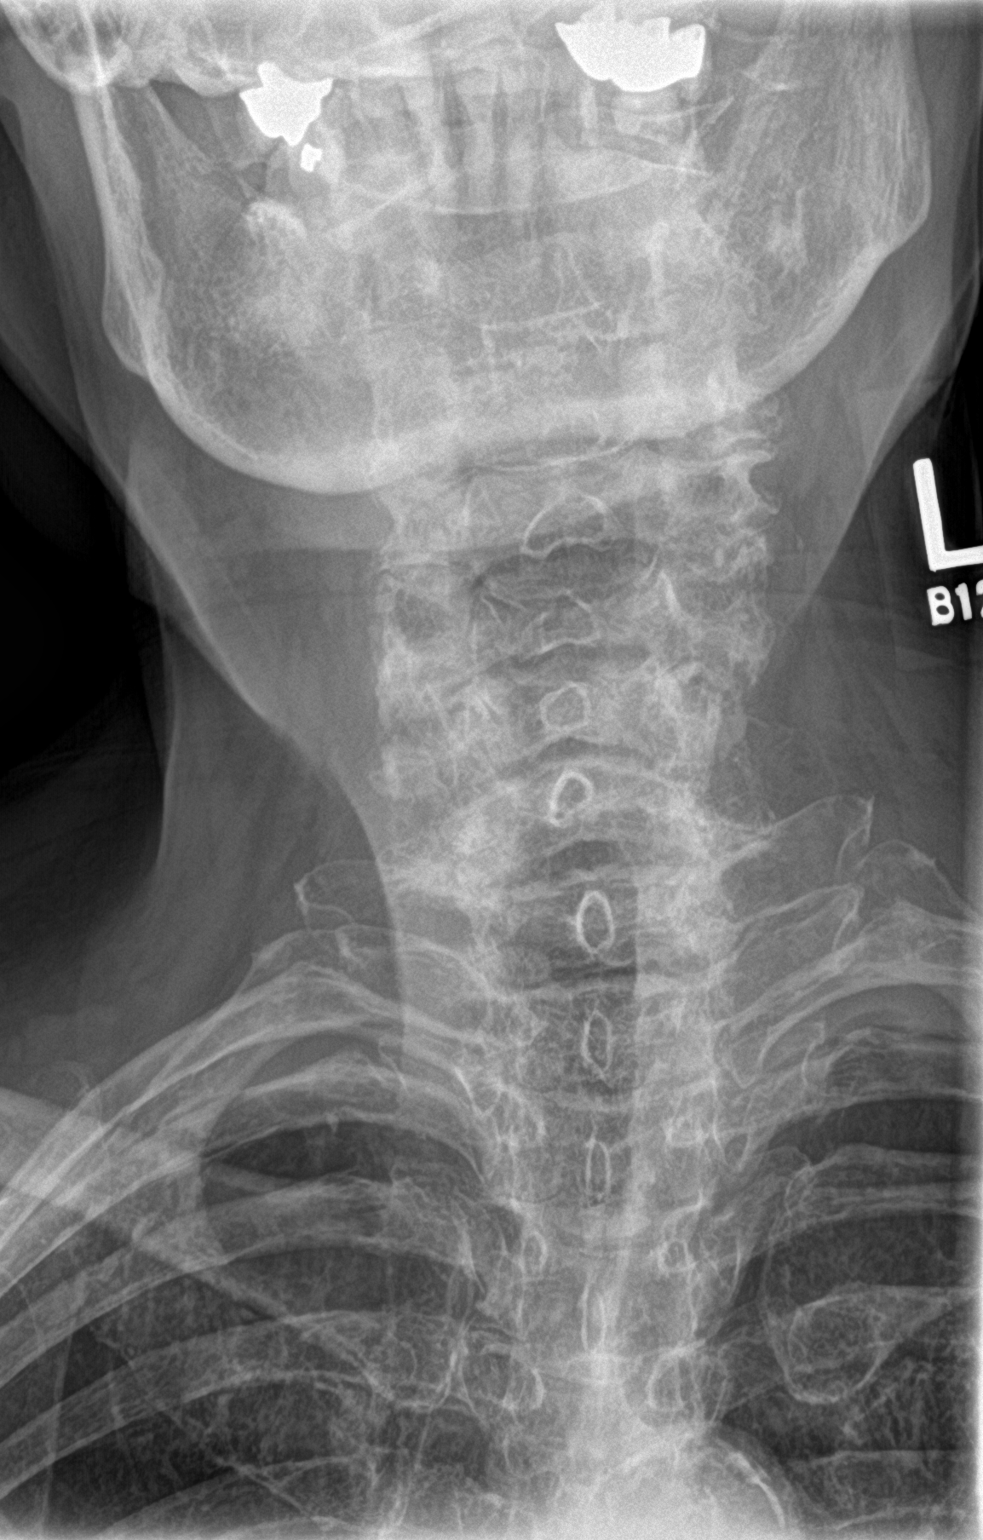

[c-spine open mouth]
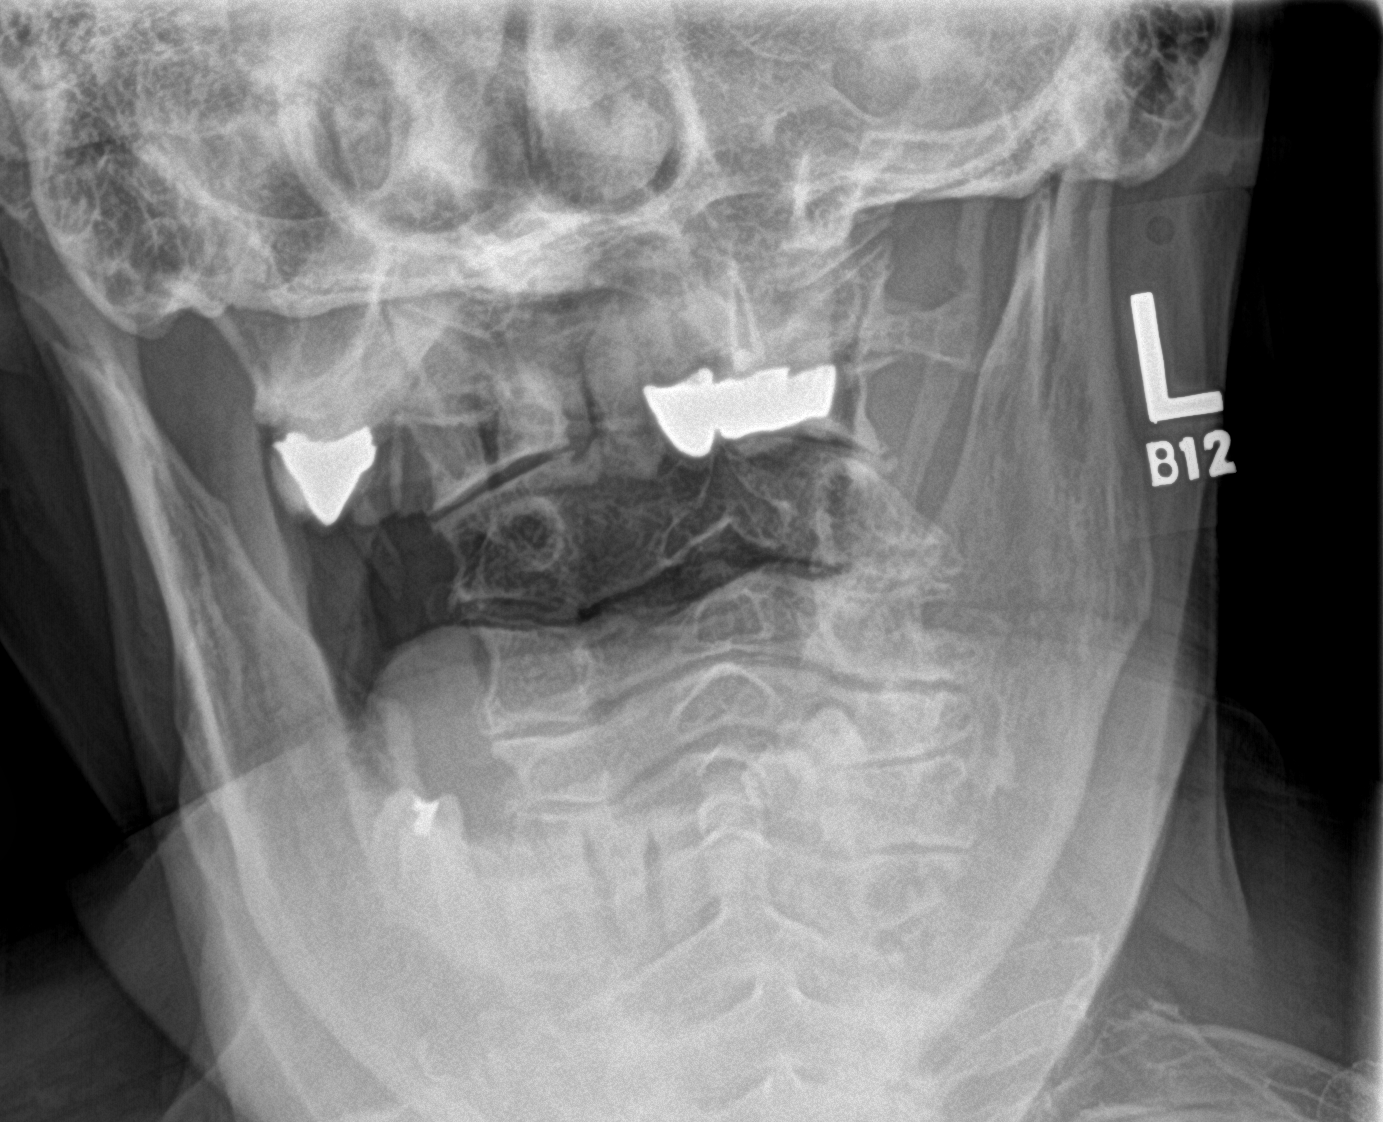

[3 of 3 positions shown; findings below may reference images not displayed]

FINDINGS: Osseous demineralization.

Prevertebral soft tissues normal thickness.

Reversal of cervical lordosis question muscle spasm, unchanged.

Vertebral body heights maintained without fracture or subluxation.

Disc space narrowing with endplate spur formation at C5-C6 and
C6-C7.

Scattered facet degenerative changes.

Slight head tilt to the LEFT.

Lung apices clear.
IMPRESSION: Degenerative disc and facet disease changes of the cervical spine.

Question muscle spasm.

## 2019-07-16 ENCOUNTER — Other Ambulatory Visit: Payer: Self-pay | Admitting: Internal Medicine

## 2019-07-17 NOTE — Telephone Encounter (Signed)
Last RF 06/17/19 Last OV 11/20/18 Next OV 10/15/19

## 2019-07-18 DIAGNOSIS — J449 Chronic obstructive pulmonary disease, unspecified: Secondary | ICD-10-CM | POA: Diagnosis not present

## 2019-07-18 DIAGNOSIS — J9611 Chronic respiratory failure with hypoxia: Secondary | ICD-10-CM | POA: Diagnosis not present

## 2019-08-02 ENCOUNTER — Telehealth: Payer: Self-pay | Admitting: Internal Medicine

## 2019-08-02 MED ORDER — AZITHROMYCIN 250 MG PO TABS: ORAL_TABLET | ORAL | 0 refills | Status: DC

## 2019-08-02 NOTE — Telephone Encounter (Signed)
Message sent to app of the day, Michelle Barrow, NP  [name], this patient was last seen by you 02/26/19 for cough and copd.  She stated that for th last 2 days she has been coughing up white and yellow thick congestion. And when the weather is damp she seems to have this problem. Concerned because of for long the weather may remain this way, she does not want to get worse.  Patient stated she does not have a fever, but has had some wheezing. Has been using her medications as directed and drinking water.  Based on this and her last office note, would it be appropriate for something to be sent to the pharmacy? Please advise. Thanks.

## 2019-08-02 NOTE — Telephone Encounter (Signed)
Called and spoke with pt letting her know that [facility] sent Rx for zpak to pharmacy for her. Pt verbalized understanding. Nothing further needed.

## 2019-08-02 NOTE — Telephone Encounter (Signed)
Will send in azithromycin. Continue flovent twice daily and use xopenex every 6 hours for wheezing. Take mucinex. Make sure she has a follow-up with Dr. Annamaria Boots in March.

## 2019-08-06 ENCOUNTER — Other Ambulatory Visit: Payer: Self-pay | Admitting: Internal Medicine

## 2019-08-15 ENCOUNTER — Other Ambulatory Visit: Payer: Self-pay | Admitting: Internal Medicine

## 2019-08-18 DIAGNOSIS — J9611 Chronic respiratory failure with hypoxia: Secondary | ICD-10-CM | POA: Diagnosis not present

## 2019-08-18 DIAGNOSIS — J449 Chronic obstructive pulmonary disease, unspecified: Secondary | ICD-10-CM | POA: Diagnosis not present

## 2019-08-27 ENCOUNTER — Other Ambulatory Visit: Payer: Self-pay

## 2019-08-27 ENCOUNTER — Ambulatory Visit (INDEPENDENT_AMBULATORY_CARE_PROVIDER_SITE_OTHER): Payer: PPO

## 2019-08-27 ENCOUNTER — Ambulatory Visit (INDEPENDENT_AMBULATORY_CARE_PROVIDER_SITE_OTHER): Payer: PPO | Admitting: Internal Medicine

## 2019-08-27 ENCOUNTER — Encounter: Payer: Self-pay | Admitting: Internal Medicine

## 2019-08-27 VITALS — BP 120/64 | HR 94 | Temp 97.2°F | Ht 66.0 in | Wt 130.0 lb

## 2019-08-27 DIAGNOSIS — J441 Chronic obstructive pulmonary disease with (acute) exacerbation: Secondary | ICD-10-CM

## 2019-08-27 DIAGNOSIS — J90 Pleural effusion, not elsewhere classified: Secondary | ICD-10-CM | POA: Diagnosis not present

## 2019-08-27 DIAGNOSIS — C3491 Malignant neoplasm of unspecified part of right bronchus or lung: Secondary | ICD-10-CM | POA: Diagnosis not present

## 2019-08-27 DIAGNOSIS — J449 Chronic obstructive pulmonary disease, unspecified: Secondary | ICD-10-CM

## 2019-08-27 MED ORDER — CEFDINIR 300 MG PO CAPS: 300.0000 mg | ORAL_CAPSULE | Freq: Two times a day (BID) | ORAL | 0 refills | Status: DC

## 2019-08-27 MED ORDER — METHYLPREDNISOLONE ACETATE 80 MG/ML IJ SUSP
80.0000 mg | Freq: Once | INTRAMUSCULAR | Status: AC
  Administered 2019-08-27: 80 mg via INTRAMUSCULAR

## 2019-08-27 NOTE — Progress Notes (Signed)
Patient ID: [name], female    DOB: [birth year], 86 y.o.   MRN: [id number]  HPI female former smoker followed for history of right upper lobe resection/NSSCA, COPD, chronic hypoxic respiratory failure, allergic rhinitis, complicated by glaucoma Oximetry walk test 07/06/2015-desaturation to 85% while walking on room air. Saturation sustained at 97% on 2 L prongs  ----------------------------------------------------------------------------- .   05/03/2018- 86 year old female former smoker followed for history of right upper lobe resection/NSSCA, COPD, chronic hypoxic respiratory failure, allergic rhinitis, complicated by glaucoma O2 3L/ [employer] -----pt c/o occ prod cough with clear mucus after using neb/inhalers and sob/wheezing with exertion. Neb Xopenex, Flovent HFA, Flutter device, Her nebulizer treatments definitely help when needed. Still recovering from a fall with soft tissue trauma 2 months ago.  Mobility limited by arthritis. Her breathing has been stable with occasional productive cough/ white sputum, mostly after nebulizer treatments. Unable to wear her pneumatic vest after arm injury.  08/26/19- 86 year old female former smoker followed for history of right upper lobe resection/NSSCA, COPD, chronic hypoxic respiratory failure, allergic rhinitis, complicated by glaucoma O2 3L/ [employer] Neb Xopenex 1.25, Flovent 110,  ----f/u Cough/Bronchiectasis without complication/COPD.Past COVID 05/2019.O2 3l/min Covid infection treated at home in December. Family here. For 2-3 weeks notes increased productive cough yellow, green, no fever or blood. Zpak at onset gave nausea and diarrhea= common problem with most antibiotics. Hx diverticulosis w/o abd pain. CXR 05/20/2019- Similar appearance of pleuroparenchymal scarring at the base of the right hemithorax as compared to prior examination 02/26/2019. No convincing superimposed airspace disease. Left lung clear. Sequela of prior  right upper lobectomy. Aortic atherosclerosis.  ROS-see HPI  "+" = positive Constitutional:    weight loss, night sweats, fevers, chills, fatigue, lassitude. HEENT:    headaches, difficulty swallowing, tooth/dental problems, sore throat,       sneezing, itching, ear ache, nasal congestion, post nasal drip, snoring CV:     chest pain, orthopnea, PND, swelling in lower extremities, anasarca,                                                        dizziness, palpitations Resp:   + shortness of breath with exertion or at rest.               + productive cough,   non-productive cough, coughing up of blood.              change in color of mucus.  wheezing.   Skin:    rash or lesions. GI:  No-   heartburn, indigestion, abdominal pain, nausea, vomiting,  GU:  MS:   + joint pain, stiffness, . Neuro-     nothing unusual Psych:  change in mood or affect.  depression or anxiety.   memory loss.   Objective:   OBJ- Physical Exam General- Alert, Oriented, Affect-appropriate, Distress- none acute, + Wearing O2 3 L pulse + wheelchair Skin- rash-none, lesions- none, excoriation- none Lymphadenopathy- none Head- atraumatic            Eyes- Gross vision intact, PERRLA, conjunctivae and secretions clear            Ears- Hearing, canals-normal            Nose- Clear, no-Septal dev, mucus, polyps, erosion, perforation  Throat- Mallampati II , mucosa clear , drainage- none, tonsils- atrophic Neck- flexible , trachea midline, no stridor , thyroid nl, carotid no bruit Chest - symmetrical excursion , unlabored           Heart/CV- RRR , no murmur , no gallop  , no rub, nl s1 s2                           - JVD- none , edema- none, stasis changes- none, varices- none           Lung-  +  , dullness-none, rub- none,  wheeze-none. Cough+ deep.           Chest wall- + remote right thoracotomy for right  lobectomy Abd-  Br/ Gen/ Rectal- Not done, not indicated Extrem-+ dressing on left forearm Neuro-  grossly intact to observation

## 2019-08-27 NOTE — Patient Instructions (Addendum)
Order- depo 80     Dx COPD exacerbation  Order- CXR   Dx COPD exacerbation  Script sent for cefdinir antibiotic   1 twice daily with food     Try taking this with breakfast and supper. Then take peptobismol at bedtime. See if that helps with nausea and stomach upset.   Please call if we can help

## 2019-08-27 NOTE — Assessment & Plan Note (Addendum)
Acute bronchitic exacerbation. Medication tolerance is very limited. We discussed options. Plan- Try cefdinir at meals, with peptobismol at bedtime. Depo 80. Refill xopenex neb solution when needed.

## 2019-08-27 NOTE — Assessment & Plan Note (Signed)
No recurrence Plan continued surveillance

## 2019-09-07 DIAGNOSIS — S41122A Laceration with foreign body of left upper arm, initial encounter: Secondary | ICD-10-CM | POA: Diagnosis not present

## 2019-09-09 ENCOUNTER — Telehealth: Payer: Self-pay

## 2019-09-09 DIAGNOSIS — S41112A Laceration without foreign body of left upper arm, initial encounter: Secondary | ICD-10-CM

## 2019-09-09 NOTE — Telephone Encounter (Signed)
She wants a referral for home nursing for the wound care?  I will order it - we may need to do a virtual visit due to insurance needing a visit, but I will try to order and see if we need to do that.    Is her pain controlled?   She may not need an antibiotic - they need to monitor the area closely when the change the bandage.

## 2019-09-09 NOTE — Addendum Note (Signed)
Addended by: Binnie Rail on: 09/09/2019 04:44 PM   Modules accepted: Orders

## 2019-09-09 NOTE — Telephone Encounter (Signed)
She is not in any major pain. Taking tylenol to help with any pain. Daughter wants her to see a wound care doctor first before ordering nurses for wound care. Please advise.

## 2019-09-09 NOTE — Telephone Encounter (Signed)
I ordered both.  The wound care center unfortunately can take a while to get into.  I did order it urgent, but if she is not able to get in there quickly she may want to have the nurse come out.

## 2019-09-09 NOTE — Telephone Encounter (Signed)
New message   The patient daughter [name] called to say her mother fell on Saturday morning, left arm has a major skin tear  The daughter took mom to Urgent care on Saturday the MD dressed the wound advise her to keep the dressing on x 4 days.   No pain medication, No antibiotic was given.   Please advise on wound care referral.

## 2019-09-10 NOTE — Telephone Encounter (Signed)
Daughter aware and expressed understanding

## 2019-09-11 ENCOUNTER — Telehealth: Payer: Self-pay | Admitting: Internal Medicine

## 2019-09-11 NOTE — Telephone Encounter (Signed)
Appointment scheduled for tomorrow.

## 2019-09-11 NOTE — Telephone Encounter (Signed)
New message:   Pt's daughter is calling and states the patient was referred to a wound care facility but the next appt they have is 2 weeks out. She states they would like to get a home health nurse out to look at this wound and then she would feel more comfortable waiting 2 weeks for this appt. Please advise.

## 2019-09-11 NOTE — Progress Notes (Signed)
Virtual Visit via Video Note  I connected with [name] on 09/12/19 at 10:45 AM EDT by a video enabled telemedicine application and verified that I am speaking with the correct person using two identifiers.   I discussed the limitations of evaluation and management by telemedicine and the availability of in person appointments. The patient expressed understanding and agreed to proceed.  Present for the visit:  Myself, Dr Billey Gosling, [name], her daughter [name] is also present and helps provide some history.  The patient is currently at home and I am in the office.    No referring provider.    History of Present Illness: This is an acute visit for a skin tear.  She fell at home 09/07/2019. She got up from bed and was going to the commode and fell. She has a couple of bruises and is sore, but she has a skin tear on her left arm. She did go to urgent care and the doctor cleaned out the wound well and advised on wound care. She does have all of the supplies at home from previous skin tears.  She is keeping it wrapped. She is putting nonstick pads and gauze on it. She denies any discharge, bleeding, surrounding erythema or swelling. She has not had any fevers. She has minimal pain and does not need anything for pain.  She is very concerned about this becoming infected and how to get it to heal the fastest. She has had skin tears in the past and has had a wound care nurse come to her house to help and she would like to have that again.      Social History   Socioeconomic History  . Marital status: Married    Spouse name: Not on file  . Number of children: 2  . Years of education: 25  . Highest education level: High school graduate  Occupational History  . Occupation: Retired Animator, School system    Employer: RETIRED  Tobacco Use  . Smoking status: Former Smoker    Packs/day: 1.00    Years: 42.00    Pack years: 42.00    Types: Cigarettes    Quit date:  09/22/2003    Years since quitting: 15.9  . Smokeless tobacco: Never Used  Substance and Sexual Activity  . Alcohol use: Yes    Alcohol/week: 14.0 standard drinks    Types: 14 Glasses of wine per week    Comment: 12/22/2014 "I have a large glass of wine qd"  . Drug use: No  . Sexual activity: Never  Other Topics Concern  . Not on file  Social History Narrative   Lives with husband in a one story home.  Has 2 children.  Retired.  Education: high school.    Social Determinants of Health   Financial Resource Strain:   . Difficulty of Paying Living Expenses:   Food Insecurity:   . Worried About Charity fundraiser in the Last Year:   . Arboriculturist in the Last Year:   Transportation Needs:   . Film/video editor (Medical):   [name] Lack of Transportation (Non-Medical):   Physical Activity:   . Days of Exercise per Week:   . Minutes of Exercise per Session:   Stress:   . Feeling of Stress :   Social Connections:   . Frequency of Communication with Friends and Family:   . Frequency of Social Gatherings with Friends and Family:   . Attends Religious Services:   .  Active Member of Clubs or Organizations:   . Attends Archivist Meetings:   [name] Marital Status:      Observations/Objective: Appears well in NAD Breathing normally Skin appears warm and dry. Skin tear visible on left proximal forearm through antecubital region and into distal left upper arm. Steri-Strips in place on some parts of the wound. No surrounding swelling or erythema. No active bleeding or discharge. Wound overall looks clean and dry.  Assessment and Plan:  See Problem List for Assessment and Plan of chronic medical problems.   Follow Up Instructions:    I discussed the assessment and treatment plan with the patient. The patient was provided an opportunity to ask questions and all were answered. The patient agreed with the plan and demonstrated an understanding of the instructions.   The patient  was advised to call back or seek an in-person evaluation if the symptoms worsen or if the condition fails to improve as anticipated.    Binnie Rail, MD

## 2019-09-12 ENCOUNTER — Ambulatory Visit: Payer: PPO | Admitting: Internal Medicine

## 2019-09-12 ENCOUNTER — Ambulatory Visit (INDEPENDENT_AMBULATORY_CARE_PROVIDER_SITE_OTHER): Payer: PPO | Admitting: Internal Medicine

## 2019-09-12 ENCOUNTER — Encounter: Payer: Self-pay | Admitting: Internal Medicine

## 2019-09-12 DIAGNOSIS — S41112A Laceration without foreign body of left upper arm, initial encounter: Secondary | ICD-10-CM | POA: Diagnosis not present

## 2019-09-12 NOTE — Assessment & Plan Note (Signed)
Acute Secondary to fall at home on 09/07/2019 No evidence of infection Has silver sulfadiazine and Medihoney at home-not sure which one to use and has all of the wrapping supplies Advised to use silver sulfadiazine for now and keep Berwind for wound care ordered She was referred to the wound center, but they will hold off on that for now unless there is no improvement Monitor closely for infection and call with any concerns

## 2019-09-13 ENCOUNTER — Other Ambulatory Visit: Payer: Self-pay | Admitting: Internal Medicine

## 2019-09-16 NOTE — Telephone Encounter (Signed)
  1.Medication Requested: ALPRAZolam (XANAX) 1 MG tablet  2. Pharmacy (Name, Street, [place]): [id number] - [place], [place] - [address]   3. On Med List: yes  4. Last Visit with PCP: 09/12/19  5. Next visit date with PCP: 10/15/19   Agent: Please be advised that RX refills may take up to 3 business days. We ask that you follow-up with your pharmacy.

## 2019-09-16 NOTE — Telephone Encounter (Signed)
Last RF 08/15/19 Last OV 04/15/20 Next OV 10/15/19

## 2019-09-17 DIAGNOSIS — Z87891 Personal history of nicotine dependence: Secondary | ICD-10-CM | POA: Diagnosis not present

## 2019-09-17 DIAGNOSIS — F419 Anxiety disorder, unspecified: Secondary | ICD-10-CM | POA: Diagnosis not present

## 2019-09-17 DIAGNOSIS — E039 Hypothyroidism, unspecified: Secondary | ICD-10-CM | POA: Diagnosis not present

## 2019-09-17 DIAGNOSIS — Z9981 Dependence on supplemental oxygen: Secondary | ICD-10-CM | POA: Diagnosis not present

## 2019-09-17 DIAGNOSIS — M7061 Trochanteric bursitis, right hip: Secondary | ICD-10-CM | POA: Diagnosis not present

## 2019-09-17 DIAGNOSIS — G629 Polyneuropathy, unspecified: Secondary | ICD-10-CM | POA: Diagnosis not present

## 2019-09-17 DIAGNOSIS — Z8701 Personal history of pneumonia (recurrent): Secondary | ICD-10-CM | POA: Diagnosis not present

## 2019-09-17 DIAGNOSIS — M8580 Other specified disorders of bone density and structure, unspecified site: Secondary | ICD-10-CM | POA: Diagnosis not present

## 2019-09-17 DIAGNOSIS — M5030 Other cervical disc degeneration, unspecified cervical region: Secondary | ICD-10-CM | POA: Diagnosis not present

## 2019-09-17 DIAGNOSIS — M7062 Trochanteric bursitis, left hip: Secondary | ICD-10-CM | POA: Diagnosis not present

## 2019-09-17 DIAGNOSIS — M5136 Other intervertebral disc degeneration, lumbar region: Secondary | ICD-10-CM | POA: Diagnosis not present

## 2019-09-17 DIAGNOSIS — M1711 Unilateral primary osteoarthritis, right knee: Secondary | ICD-10-CM | POA: Diagnosis not present

## 2019-09-17 DIAGNOSIS — Z9181 History of falling: Secondary | ICD-10-CM | POA: Diagnosis not present

## 2019-09-17 DIAGNOSIS — S41112D Laceration without foreign body of left upper arm, subsequent encounter: Secondary | ICD-10-CM | POA: Diagnosis not present

## 2019-09-17 DIAGNOSIS — J9611 Chronic respiratory failure with hypoxia: Secondary | ICD-10-CM | POA: Diagnosis not present

## 2019-09-17 DIAGNOSIS — K219 Gastro-esophageal reflux disease without esophagitis: Secondary | ICD-10-CM | POA: Diagnosis not present

## 2019-09-17 DIAGNOSIS — Z7951 Long term (current) use of inhaled steroids: Secondary | ICD-10-CM | POA: Diagnosis not present

## 2019-09-17 DIAGNOSIS — J439 Emphysema, unspecified: Secondary | ICD-10-CM | POA: Diagnosis not present

## 2019-09-17 DIAGNOSIS — Z85118 Personal history of other malignant neoplasm of bronchus and lung: Secondary | ICD-10-CM | POA: Diagnosis not present

## 2019-09-19 DIAGNOSIS — J439 Emphysema, unspecified: Secondary | ICD-10-CM | POA: Diagnosis not present

## 2019-09-19 DIAGNOSIS — Z9181 History of falling: Secondary | ICD-10-CM | POA: Diagnosis not present

## 2019-09-19 DIAGNOSIS — K219 Gastro-esophageal reflux disease without esophagitis: Secondary | ICD-10-CM | POA: Diagnosis not present

## 2019-09-19 DIAGNOSIS — M5030 Other cervical disc degeneration, unspecified cervical region: Secondary | ICD-10-CM | POA: Diagnosis not present

## 2019-09-19 DIAGNOSIS — M7062 Trochanteric bursitis, left hip: Secondary | ICD-10-CM | POA: Diagnosis not present

## 2019-09-19 DIAGNOSIS — Z7951 Long term (current) use of inhaled steroids: Secondary | ICD-10-CM | POA: Diagnosis not present

## 2019-09-19 DIAGNOSIS — E039 Hypothyroidism, unspecified: Secondary | ICD-10-CM | POA: Diagnosis not present

## 2019-09-19 DIAGNOSIS — Z87891 Personal history of nicotine dependence: Secondary | ICD-10-CM | POA: Diagnosis not present

## 2019-09-19 DIAGNOSIS — Z8701 Personal history of pneumonia (recurrent): Secondary | ICD-10-CM | POA: Diagnosis not present

## 2019-09-19 DIAGNOSIS — M8580 Other specified disorders of bone density and structure, unspecified site: Secondary | ICD-10-CM | POA: Diagnosis not present

## 2019-09-19 DIAGNOSIS — M7061 Trochanteric bursitis, right hip: Secondary | ICD-10-CM | POA: Diagnosis not present

## 2019-09-19 DIAGNOSIS — Z9981 Dependence on supplemental oxygen: Secondary | ICD-10-CM | POA: Diagnosis not present

## 2019-09-19 DIAGNOSIS — F419 Anxiety disorder, unspecified: Secondary | ICD-10-CM | POA: Diagnosis not present

## 2019-09-19 DIAGNOSIS — Z85118 Personal history of other malignant neoplasm of bronchus and lung: Secondary | ICD-10-CM | POA: Diagnosis not present

## 2019-09-19 DIAGNOSIS — J9611 Chronic respiratory failure with hypoxia: Secondary | ICD-10-CM | POA: Diagnosis not present

## 2019-09-19 DIAGNOSIS — S41112D Laceration without foreign body of left upper arm, subsequent encounter: Secondary | ICD-10-CM | POA: Diagnosis not present

## 2019-09-19 DIAGNOSIS — G629 Polyneuropathy, unspecified: Secondary | ICD-10-CM | POA: Diagnosis not present

## 2019-09-19 DIAGNOSIS — M1711 Unilateral primary osteoarthritis, right knee: Secondary | ICD-10-CM | POA: Diagnosis not present

## 2019-09-19 DIAGNOSIS — M5136 Other intervertebral disc degeneration, lumbar region: Secondary | ICD-10-CM | POA: Diagnosis not present

## 2019-09-23 ENCOUNTER — Other Ambulatory Visit: Payer: Self-pay | Admitting: Internal Medicine

## 2019-09-24 ENCOUNTER — Ambulatory Visit (HOSPITAL_BASED_OUTPATIENT_CLINIC_OR_DEPARTMENT_OTHER): Payer: PPO | Admitting: Internal Medicine

## 2019-09-24 DIAGNOSIS — F419 Anxiety disorder, unspecified: Secondary | ICD-10-CM | POA: Diagnosis not present

## 2019-09-24 DIAGNOSIS — Z9981 Dependence on supplemental oxygen: Secondary | ICD-10-CM | POA: Diagnosis not present

## 2019-09-24 DIAGNOSIS — M7061 Trochanteric bursitis, right hip: Secondary | ICD-10-CM | POA: Diagnosis not present

## 2019-09-24 DIAGNOSIS — M5136 Other intervertebral disc degeneration, lumbar region: Secondary | ICD-10-CM | POA: Diagnosis not present

## 2019-09-24 DIAGNOSIS — J439 Emphysema, unspecified: Secondary | ICD-10-CM | POA: Diagnosis not present

## 2019-09-24 DIAGNOSIS — J9611 Chronic respiratory failure with hypoxia: Secondary | ICD-10-CM | POA: Diagnosis not present

## 2019-09-24 DIAGNOSIS — M5030 Other cervical disc degeneration, unspecified cervical region: Secondary | ICD-10-CM | POA: Diagnosis not present

## 2019-09-24 DIAGNOSIS — Z7951 Long term (current) use of inhaled steroids: Secondary | ICD-10-CM | POA: Diagnosis not present

## 2019-09-24 DIAGNOSIS — S41112D Laceration without foreign body of left upper arm, subsequent encounter: Secondary | ICD-10-CM | POA: Diagnosis not present

## 2019-09-24 DIAGNOSIS — E039 Hypothyroidism, unspecified: Secondary | ICD-10-CM | POA: Diagnosis not present

## 2019-09-24 DIAGNOSIS — Z85118 Personal history of other malignant neoplasm of bronchus and lung: Secondary | ICD-10-CM | POA: Diagnosis not present

## 2019-09-24 DIAGNOSIS — Z9181 History of falling: Secondary | ICD-10-CM | POA: Diagnosis not present

## 2019-09-24 DIAGNOSIS — G629 Polyneuropathy, unspecified: Secondary | ICD-10-CM | POA: Diagnosis not present

## 2019-09-24 DIAGNOSIS — K219 Gastro-esophageal reflux disease without esophagitis: Secondary | ICD-10-CM | POA: Diagnosis not present

## 2019-09-24 DIAGNOSIS — Z8701 Personal history of pneumonia (recurrent): Secondary | ICD-10-CM | POA: Diagnosis not present

## 2019-09-24 DIAGNOSIS — M7062 Trochanteric bursitis, left hip: Secondary | ICD-10-CM | POA: Diagnosis not present

## 2019-09-24 DIAGNOSIS — M8580 Other specified disorders of bone density and structure, unspecified site: Secondary | ICD-10-CM | POA: Diagnosis not present

## 2019-09-24 DIAGNOSIS — M1711 Unilateral primary osteoarthritis, right knee: Secondary | ICD-10-CM | POA: Diagnosis not present

## 2019-09-24 DIAGNOSIS — Z87891 Personal history of nicotine dependence: Secondary | ICD-10-CM | POA: Diagnosis not present

## 2019-09-27 ENCOUNTER — Telehealth: Payer: Self-pay | Admitting: Internal Medicine

## 2019-09-27 DIAGNOSIS — Z9981 Dependence on supplemental oxygen: Secondary | ICD-10-CM

## 2019-09-27 DIAGNOSIS — Z7951 Long term (current) use of inhaled steroids: Secondary | ICD-10-CM

## 2019-09-27 DIAGNOSIS — Z8701 Personal history of pneumonia (recurrent): Secondary | ICD-10-CM

## 2019-09-27 DIAGNOSIS — Z85118 Personal history of other malignant neoplasm of bronchus and lung: Secondary | ICD-10-CM

## 2019-09-27 DIAGNOSIS — E039 Hypothyroidism, unspecified: Secondary | ICD-10-CM

## 2019-09-27 DIAGNOSIS — M8580 Other specified disorders of bone density and structure, unspecified site: Secondary | ICD-10-CM

## 2019-09-27 DIAGNOSIS — G629 Polyneuropathy, unspecified: Secondary | ICD-10-CM

## 2019-09-27 DIAGNOSIS — S41112D Laceration without foreign body of left upper arm, subsequent encounter: Secondary | ICD-10-CM

## 2019-09-27 DIAGNOSIS — Z9181 History of falling: Secondary | ICD-10-CM

## 2019-09-27 DIAGNOSIS — M1711 Unilateral primary osteoarthritis, right knee: Secondary | ICD-10-CM

## 2019-09-27 DIAGNOSIS — M7062 Trochanteric bursitis, left hip: Secondary | ICD-10-CM

## 2019-09-27 DIAGNOSIS — J9611 Chronic respiratory failure with hypoxia: Secondary | ICD-10-CM

## 2019-09-27 DIAGNOSIS — M5030 Other cervical disc degeneration, unspecified cervical region: Secondary | ICD-10-CM

## 2019-09-27 DIAGNOSIS — J439 Emphysema, unspecified: Secondary | ICD-10-CM

## 2019-09-27 DIAGNOSIS — F419 Anxiety disorder, unspecified: Secondary | ICD-10-CM

## 2019-09-27 DIAGNOSIS — M7061 Trochanteric bursitis, right hip: Secondary | ICD-10-CM

## 2019-09-27 DIAGNOSIS — K219 Gastro-esophageal reflux disease without esophagitis: Secondary | ICD-10-CM

## 2019-09-27 DIAGNOSIS — M5136 Other intervertebral disc degeneration, lumbar region: Secondary | ICD-10-CM

## 2019-09-27 DIAGNOSIS — Z87891 Personal history of nicotine dependence: Secondary | ICD-10-CM

## 2019-09-27 MED ORDER — LEVALBUTEROL HCL 1.25 MG/3ML IN NEBU: 1.2500 mg | INHALATION_SOLUTION | Freq: Four times a day (QID) | RESPIRATORY_TRACT | 5 refills | Status: AC | PRN

## 2019-09-27 NOTE — Telephone Encounter (Signed)
Called and spoke with Patient.  Patient requested a refill of levalbuterol to be sent in smaller quanity, to [place]. Prescription sent to requested pharmacy.  Nothing further at this time.

## 2019-09-30 DIAGNOSIS — M7062 Trochanteric bursitis, left hip: Secondary | ICD-10-CM | POA: Diagnosis not present

## 2019-09-30 DIAGNOSIS — G629 Polyneuropathy, unspecified: Secondary | ICD-10-CM | POA: Diagnosis not present

## 2019-09-30 DIAGNOSIS — J439 Emphysema, unspecified: Secondary | ICD-10-CM | POA: Diagnosis not present

## 2019-09-30 DIAGNOSIS — J9611 Chronic respiratory failure with hypoxia: Secondary | ICD-10-CM | POA: Diagnosis not present

## 2019-09-30 DIAGNOSIS — Z9181 History of falling: Secondary | ICD-10-CM | POA: Diagnosis not present

## 2019-09-30 DIAGNOSIS — Z7951 Long term (current) use of inhaled steroids: Secondary | ICD-10-CM | POA: Diagnosis not present

## 2019-09-30 DIAGNOSIS — Z87891 Personal history of nicotine dependence: Secondary | ICD-10-CM | POA: Diagnosis not present

## 2019-09-30 DIAGNOSIS — M5030 Other cervical disc degeneration, unspecified cervical region: Secondary | ICD-10-CM | POA: Diagnosis not present

## 2019-09-30 DIAGNOSIS — M5136 Other intervertebral disc degeneration, lumbar region: Secondary | ICD-10-CM | POA: Diagnosis not present

## 2019-09-30 DIAGNOSIS — Z85118 Personal history of other malignant neoplasm of bronchus and lung: Secondary | ICD-10-CM | POA: Diagnosis not present

## 2019-09-30 DIAGNOSIS — Z8701 Personal history of pneumonia (recurrent): Secondary | ICD-10-CM | POA: Diagnosis not present

## 2019-09-30 DIAGNOSIS — M7061 Trochanteric bursitis, right hip: Secondary | ICD-10-CM | POA: Diagnosis not present

## 2019-09-30 DIAGNOSIS — M8580 Other specified disorders of bone density and structure, unspecified site: Secondary | ICD-10-CM | POA: Diagnosis not present

## 2019-09-30 DIAGNOSIS — K219 Gastro-esophageal reflux disease without esophagitis: Secondary | ICD-10-CM | POA: Diagnosis not present

## 2019-09-30 DIAGNOSIS — M1711 Unilateral primary osteoarthritis, right knee: Secondary | ICD-10-CM | POA: Diagnosis not present

## 2019-09-30 DIAGNOSIS — Z9981 Dependence on supplemental oxygen: Secondary | ICD-10-CM | POA: Diagnosis not present

## 2019-09-30 DIAGNOSIS — E039 Hypothyroidism, unspecified: Secondary | ICD-10-CM | POA: Diagnosis not present

## 2019-09-30 DIAGNOSIS — S41112D Laceration without foreign body of left upper arm, subsequent encounter: Secondary | ICD-10-CM | POA: Diagnosis not present

## 2019-09-30 DIAGNOSIS — F419 Anxiety disorder, unspecified: Secondary | ICD-10-CM | POA: Diagnosis not present

## 2019-10-07 DIAGNOSIS — Z9981 Dependence on supplemental oxygen: Secondary | ICD-10-CM | POA: Diagnosis not present

## 2019-10-07 DIAGNOSIS — J9611 Chronic respiratory failure with hypoxia: Secondary | ICD-10-CM | POA: Diagnosis not present

## 2019-10-07 DIAGNOSIS — Z85118 Personal history of other malignant neoplasm of bronchus and lung: Secondary | ICD-10-CM | POA: Diagnosis not present

## 2019-10-07 DIAGNOSIS — Z9181 History of falling: Secondary | ICD-10-CM | POA: Diagnosis not present

## 2019-10-07 DIAGNOSIS — Z7951 Long term (current) use of inhaled steroids: Secondary | ICD-10-CM | POA: Diagnosis not present

## 2019-10-07 DIAGNOSIS — F419 Anxiety disorder, unspecified: Secondary | ICD-10-CM | POA: Diagnosis not present

## 2019-10-07 DIAGNOSIS — Z87891 Personal history of nicotine dependence: Secondary | ICD-10-CM | POA: Diagnosis not present

## 2019-10-07 DIAGNOSIS — M1711 Unilateral primary osteoarthritis, right knee: Secondary | ICD-10-CM | POA: Diagnosis not present

## 2019-10-07 DIAGNOSIS — Z8701 Personal history of pneumonia (recurrent): Secondary | ICD-10-CM | POA: Diagnosis not present

## 2019-10-07 DIAGNOSIS — M7062 Trochanteric bursitis, left hip: Secondary | ICD-10-CM | POA: Diagnosis not present

## 2019-10-07 DIAGNOSIS — M5136 Other intervertebral disc degeneration, lumbar region: Secondary | ICD-10-CM | POA: Diagnosis not present

## 2019-10-07 DIAGNOSIS — E039 Hypothyroidism, unspecified: Secondary | ICD-10-CM | POA: Diagnosis not present

## 2019-10-07 DIAGNOSIS — J439 Emphysema, unspecified: Secondary | ICD-10-CM | POA: Diagnosis not present

## 2019-10-07 DIAGNOSIS — M7061 Trochanteric bursitis, right hip: Secondary | ICD-10-CM | POA: Diagnosis not present

## 2019-10-07 DIAGNOSIS — M8580 Other specified disorders of bone density and structure, unspecified site: Secondary | ICD-10-CM | POA: Diagnosis not present

## 2019-10-07 DIAGNOSIS — G629 Polyneuropathy, unspecified: Secondary | ICD-10-CM | POA: Diagnosis not present

## 2019-10-07 DIAGNOSIS — S41112D Laceration without foreign body of left upper arm, subsequent encounter: Secondary | ICD-10-CM | POA: Diagnosis not present

## 2019-10-07 DIAGNOSIS — K219 Gastro-esophageal reflux disease without esophagitis: Secondary | ICD-10-CM | POA: Diagnosis not present

## 2019-10-07 DIAGNOSIS — M5030 Other cervical disc degeneration, unspecified cervical region: Secondary | ICD-10-CM | POA: Diagnosis not present

## 2019-10-13 ENCOUNTER — Other Ambulatory Visit: Payer: Self-pay | Admitting: Internal Medicine

## 2019-10-14 NOTE — Patient Instructions (Signed)
  Blood work was ordered.     Medications reviewed and updated.  Changes include :     Your prescription(s) have been submitted to your pharmacy. Please take as directed and contact our office if you believe you are having problem(s) with the medication(s).  A referral was ordered for        Someone will call you to schedule this.    Please followup in 6 months   

## 2019-10-14 NOTE — Progress Notes (Signed)
Subjective:    Patient ID: [name], female    DOB: [birth year], 86 y.o.   MRN: [id number]  HPI The patient is here for follow up of their chronic medical problems, including copd/chronic resp failure, hypothyroid, hyperlipidemia, depression/anxiety, OA in knee and hip, freq falls      Medications and allergies reviewed with patient and updated if appropriate.  Patient Active Problem List   Diagnosis Date Noted  . Skin tear of left upper arm without complication 03/26/1218  . COPD with acute exacerbation ([place]) 05/22/2019  . Urinary incontinence due to immobility 05/22/2019  . Non-healing wound of lower extremity 05/03/2019  . Multiple skin tears 03/02/2019  . Weight loss 11/20/2018  . Depression 11/20/2018  . Recurrent falls 11/20/2018  . Parotiditis 09/17/2018  . Primary osteoarthritis of right hip 06/21/2018  . Degenerative disc disease, cervical 12/06/2017  . DOE (dyspnea on exertion) 11/21/2017  . Bilateral swelling of feet 11/02/2017  . Osteopenia 02/16/2017  . Hyperglycemia 12/08/2016  . Degenerative arthritis of right knee 10/27/2016  . 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 Greater trochanteric bursitis of both hips 04/08/2016  . Pain in both knees 04/08/2016  . Chronic midline low back pain without sciatica 04/08/2016  . Chronic respiratory failure with hypoxia ([place]) 03/07/2015  . Diverticulitis 05/24/2014  . Anxiety   . Bladder prolapse, female, acquired   . Non-small cell carcinoma of lung, stage 1 ([place]) 01/10/2012  . Neuropathy, peripheral 01/28/2011  . COPD mixed type ([place]) 09/08/2010  . Dyslipidemia   . ATONY OF BLADDER 08/10/2009  . Hypothyroidism 03/12/2008  . Seasonal allergic rhinitis 10/10/2007    Current Outpatient Medications on File Prior to Visit  Medication Sig Dispense Refill  . acetaminophen (TYLENOL ARTHRITIS PAIN) 650 MG CR tablet Take 650 mg by mouth daily as needed for pain.     [name]  ALPRAZolam (XANAX) 1 MG tablet TAKE 1 TABLET BY MOUTH EVERYDAY AT BEDTIME 30 tablet 0  . atorvastatin (LIPITOR) 20 MG tablet TAKE 1 TABLET (20 MG TOTAL) BY MOUTH DAILY AT 6 PM. 90 tablet 1  . Cholecalciferol (VITAMIN D) 2000 UNITS tablet Take 1 tablet (2,000 Units total) by mouth daily. 30 tablet 11  . FLOVENT HFA 110 MCG/ACT inhaler TAKE 2 PUFFS BY MOUTH TWICE A DAY 12 g 5  . levalbuterol (XOPENEX) 1.25 MG/3ML nebulizer solution Take 1.25 mg by nebulization every 6 (six) hours as needed for wheezing. DX: Bronchiectasis 180 mL 5  . levothyroxine (SYNTHROID) 88 MCG tablet TAKE 1 TABLET BY MOUTH EVERY DAY 90 tablet 0  . Multiple Vitamins-Minerals (CENTRUM SILVER PO) Take 1 tablet by mouth daily.     . Probiotic Product (PROBIOTIC PO) Take 1 capsule by mouth daily.    [name] Respiratory Therapy Supplies (FLUTTER) DEVI Blow through 4 times per cycle and repeat 3 cycles per day 1 each 0  . Respiratory Therapy Supplies (NEBULIZER/TUBING/MOUTHPIECE) KIT As directed 3 each 12   No current facility-administered medications on file prior to visit.    Past Medical History:  Diagnosis Date  . Allergic rhinitis   . Anxiety   . Arthritis    "back, hands; hips" (12/22/2014)  . Asthma   . Bladder atony   . Chronic bronchitis ([place])   . COPD (chronic obstructive pulmonary disease) ([place])   . Diarrhea   . Emphysema of lung ([place])   . GERD (gastroesophageal reflux disease)    "w/spicey foods" (12/22/2014)  . Hyperlipidemia   .  Non-small cell carcinoma of lung, stage 1 ([place]) 2005   1.4 cm Poorly differentiated Squamous cell RUL  T1N0 resected 09/22/2003  . On home oxygen therapy    "3L q night and prn during the day" (12/22/2014)  . Pelvic cyst   . Pneumonia    "this is the 3rd time that I can remember" (12/22/2014)  . Unspecified hypothyroidism     Past Surgical History:  Procedure Laterality Date  . ABDOMINAL HYSTERECTOMY  ~ 1976  . BACK SURGERY    . CATARACT EXTRACTION W/ INTRAOCULAR LENS  IMPLANT, BILATERAL  Bilateral ~ 2012  . DILATION AND CURETTAGE OF UTERUS    . LUMBAR DISC SURGERY  1970's  . LYMPH NODE DISSECTION  2005   "all my lymph nodes under breasts, went thru my back; Dr. Arlyce Dice did it"  . Needle aspiration Pelvic cyst  2011   Dr Diona Fanti  . VIDEO ASSISTED THORACOSCOPY (VATS)/THOROCOTOMY Right 09/2003   thoracotomy, right upper lobectomy with node dissection; Dr Demetrios Loll 11/02/2010  . VIDEO BRONCHOSCOPY  03/2004   Archie Endo 11/02/2010    Social History   Socioeconomic History  . Marital status: Married    Spouse name: Not on file  . Number of children: 2  . Years of education: 24  . Highest education level: High school graduate  Occupational History  . Occupation: Retired Animator, School system    Employer: RETIRED  Tobacco Use  . Smoking status: Former Smoker    Packs/day: 1.00    Years: 42.00    Pack years: 42.00    Types: Cigarettes    Quit date: 09/22/2003    Years since quitting: 16.0  . Smokeless tobacco: Never Used  Substance and Sexual Activity  . Alcohol use: Yes    Alcohol/week: 14.0 standard drinks    Types: 14 Glasses of wine per week    Comment: 12/22/2014 "I have a large glass of wine qd"  . Drug use: No  . Sexual activity: Never  Other Topics Concern  . Not on file  Social History Narrative   Lives with husband in a one story home.  Has 2 children.  Retired.  Education: high school.    Social Determinants of Health   Financial Resource Strain:   . Difficulty of Paying Living Expenses:   Food Insecurity:   . Worried About Charity fundraiser in the Last Year:   . Arboriculturist in the Last Year:   Transportation Needs:   . Film/video editor (Medical):   [name] Lack of Transportation (Non-Medical):   Physical Activity:   . Days of Exercise per Week:   . Minutes of Exercise per Session:   Stress:   . Feeling of Stress :   Social Connections:   . Frequency of Communication with Friends and Family:   . Frequency of Social Gatherings  with Friends and Family:   . Attends Religious Services:   . Active Member of Clubs or Organizations:   . Attends Archivist Meetings:   [name] Marital Status:     Family History  Problem Relation Age of Onset  . Heart disease Father   . Rheumatologic disease Brother   . Cancer Brother        Leukemia  . Rheumatologic disease Sister   . Breast cancer Daughter     Review of Systems     Objective:  There were no vitals filed for this visit. BP Readings from Last 3 Encounters:  08/27/19  120/64  05/27/19 139/82  05/20/19 115/67   Wt Readings from Last 3 Encounters:  08/27/19 130 lb (59 kg)  02/26/19 142 lb (64.4 kg)  06/21/18 142 lb (64.4 kg)   There is no height or weight on file to calculate BMI.   Physical Exam    Constitutional: Appears well-developed and well-nourished. No distress.  HENT:  Head: Normocephalic and atraumatic.  Neck: Neck supple. No tracheal deviation present. No thyromegaly present.  No cervical lymphadenopathy Cardiovascular: Normal rate, regular rhythm and normal heart sounds.   No murmur heard. No carotid bruit .  No edema Pulmonary/Chest: Effort normal and breath sounds normal. No respiratory distress. No has no wheezes. No rales.  Skin: Skin is warm and dry. Not diaphoretic.  Psychiatric: Normal mood and affect. Behavior is normal.      Assessment & Plan:    See Problem List for Assessment and Plan of chronic medical problems.    This visit occurred during the SARS-CoV-2 public health emergency.  Safety protocols were in place, including screening questions prior to the visit, additional usage of staff PPE, and extensive cleaning of exam room while observing appropriate contact time as indicated for disinfecting solutions.    This encounter was created in error - please disregard.

## 2019-10-15 ENCOUNTER — Encounter: Payer: PPO | Admitting: Internal Medicine

## 2019-10-15 DIAGNOSIS — M5136 Other intervertebral disc degeneration, lumbar region: Secondary | ICD-10-CM | POA: Diagnosis not present

## 2019-10-15 DIAGNOSIS — Z85118 Personal history of other malignant neoplasm of bronchus and lung: Secondary | ICD-10-CM | POA: Diagnosis not present

## 2019-10-15 DIAGNOSIS — M7062 Trochanteric bursitis, left hip: Secondary | ICD-10-CM | POA: Diagnosis not present

## 2019-10-15 DIAGNOSIS — E039 Hypothyroidism, unspecified: Secondary | ICD-10-CM | POA: Diagnosis not present

## 2019-10-15 DIAGNOSIS — S41112D Laceration without foreign body of left upper arm, subsequent encounter: Secondary | ICD-10-CM | POA: Diagnosis not present

## 2019-10-15 DIAGNOSIS — F419 Anxiety disorder, unspecified: Secondary | ICD-10-CM | POA: Diagnosis not present

## 2019-10-15 DIAGNOSIS — J439 Emphysema, unspecified: Secondary | ICD-10-CM | POA: Diagnosis not present

## 2019-10-15 DIAGNOSIS — Z7951 Long term (current) use of inhaled steroids: Secondary | ICD-10-CM | POA: Diagnosis not present

## 2019-10-15 DIAGNOSIS — M5030 Other cervical disc degeneration, unspecified cervical region: Secondary | ICD-10-CM | POA: Diagnosis not present

## 2019-10-15 DIAGNOSIS — Z9981 Dependence on supplemental oxygen: Secondary | ICD-10-CM | POA: Diagnosis not present

## 2019-10-15 DIAGNOSIS — M7061 Trochanteric bursitis, right hip: Secondary | ICD-10-CM | POA: Diagnosis not present

## 2019-10-15 DIAGNOSIS — Z9181 History of falling: Secondary | ICD-10-CM | POA: Diagnosis not present

## 2019-10-15 DIAGNOSIS — M8580 Other specified disorders of bone density and structure, unspecified site: Secondary | ICD-10-CM | POA: Diagnosis not present

## 2019-10-15 DIAGNOSIS — Z87891 Personal history of nicotine dependence: Secondary | ICD-10-CM | POA: Diagnosis not present

## 2019-10-15 DIAGNOSIS — J9611 Chronic respiratory failure with hypoxia: Secondary | ICD-10-CM | POA: Diagnosis not present

## 2019-10-15 DIAGNOSIS — M1711 Unilateral primary osteoarthritis, right knee: Secondary | ICD-10-CM | POA: Diagnosis not present

## 2019-10-15 DIAGNOSIS — G629 Polyneuropathy, unspecified: Secondary | ICD-10-CM | POA: Diagnosis not present

## 2019-10-15 DIAGNOSIS — Z8701 Personal history of pneumonia (recurrent): Secondary | ICD-10-CM | POA: Diagnosis not present

## 2019-10-15 DIAGNOSIS — K219 Gastro-esophageal reflux disease without esophagitis: Secondary | ICD-10-CM | POA: Diagnosis not present

## 2019-10-15 NOTE — Telephone Encounter (Signed)
Check [place] registry last filled 09/16/2019.Marland KitchenJohny Chess

## 2019-11-04 NOTE — Progress Notes (Signed)
Subjective:    Patient ID: [name], female    DOB: 08-28-1932, 86 y.o.   MRN: [id number]  HPI The patient is here for an acute visit.  Her daughter is here with her.  Her daughter helps provide the history.   Stomach issues:  She developed no appetite.  She would eat something and it would come back up.  She can eat two crackers and some gingerale and that is ok, but not much more.  She has some diarrhea, but not always.  Her stool is soft.  It is dark but she takes pepto for her stomach.  She does feel reflux frequently.  She is not on any medication for her reflux.  She states nausea.  COPD/chronic respiratory failure: She feels her cough is worse-increased cough and sputum instead of being clear is sometimes white-yellow.  She does get short of breath, which is not new.She does have some wheezing.  Transfer text.  She saw her pulmonologist 2 months ago and needed a steroid injection and antibiotics at that time.  Right leg pain.  She has seen orthopedics.  She has severe arthritis in the right hip and right knee.  She is not able to take any medication for it, except for Tylenol.  If she takes too much Tylenol it will affect her stomach.  It prevents her from sleeping good.  She takes tylenol.   She is taking all of her medications daily as prescribed.  Medications and allergies reviewed with patient and updated if appropriate.  Patient Active Problem List   Diagnosis Date Noted  . Dark stools 11/05/2019  . Hypomagnesemia 10/14/2019  . Skin tear of left upper arm without complication 35/36/1443  . COPD with acute exacerbation ([place]) 05/22/2019  . Urinary incontinence due to immobility 05/22/2019  . Non-healing wound of lower extremity 05/03/2019  . Multiple skin tears 03/02/2019  . Weight loss 11/20/2018  . Depression 11/20/2018  . Recurrent falls 11/20/2018  . Parotiditis 09/17/2018  . Primary osteoarthritis of right hip 06/21/2018  . Degenerative disc disease,  cervical 12/06/2017  . DOE (dyspnea on exertion) 11/21/2017  . Bilateral swelling of feet 11/02/2017  . Osteopenia 02/16/2017  . Hyperglycemia 12/08/2016  . Degenerative arthritis of right knee 10/27/2016  . 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 Greater trochanteric bursitis of both hips 04/08/2016  . Pain in both knees 04/08/2016  . Chronic midline low back pain without sciatica 04/08/2016  . Chronic respiratory failure with hypoxia ([place]) 03/07/2015  . Diverticulitis 05/24/2014  . Anxiety   . Bladder prolapse, female, acquired   . Non-small cell carcinoma of lung, stage 1 ([place]) 01/10/2012  . Neuropathy, peripheral 01/28/2011  . COPD mixed type ([place]) 09/08/2010  . Dyslipidemia   . ATONY OF BLADDER 08/10/2009  . Hypothyroidism 03/12/2008  . Seasonal allergic rhinitis 10/10/2007    Current Outpatient Medications on File Prior to Visit  Medication Sig Dispense Refill  . ALPRAZolam (XANAX) 1 MG tablet TAKE 1 TABLET BY MOUTH EVERYDAY AT BEDTIME 30 tablet 0  . atorvastatin (LIPITOR) 20 MG tablet TAKE 1 TABLET (20 MG TOTAL) BY MOUTH DAILY AT 6 PM. (Patient taking differently: Take 20 mg by mouth daily at 6 PM. Takes on Mon, Wed, and Fri) 90 tablet 1  . Cholecalciferol (VITAMIN D) 2000 UNITS tablet Take 1 tablet (2,000 Units total) by mouth daily. 30 tablet 11  . FLOVENT HFA 110 MCG/ACT inhaler TAKE 2 PUFFS BY MOUTH TWICE  A DAY 12 g 5  . levalbuterol (XOPENEX) 1.25 MG/3ML nebulizer solution Take 1.25 mg by nebulization every 6 (six) hours as needed for wheezing. DX: Bronchiectasis 180 mL 5  . levothyroxine (SYNTHROID) 88 MCG tablet TAKE 1 TABLET BY MOUTH EVERY DAY 90 tablet 0  . Multiple Vitamins-Minerals (CENTRUM SILVER PO) Take 1 tablet by mouth daily.     . Probiotic Product (PROBIOTIC PO) Take 1 capsule by mouth daily.    [name] Respiratory Therapy Supplies (FLUTTER) DEVI Blow through 4 times per cycle and repeat 3 cycles per day 1 each 0  . Respiratory  Therapy Supplies (NEBULIZER/TUBING/MOUTHPIECE) KIT As directed 3 each 12   No current facility-administered medications on file prior to visit.    Past Medical History:  Diagnosis Date  . Allergic rhinitis   . Anxiety   . Arthritis    "back, hands; hips" (12/22/2014)  . Asthma   . Bladder atony   . Chronic bronchitis ([place])   . COPD (chronic obstructive pulmonary disease) ([place])   . Diarrhea   . Emphysema of lung ([place])   . GERD (gastroesophageal reflux disease)    "w/spicey foods" (12/22/2014)  . Hyperlipidemia   . Non-small cell carcinoma of lung, stage 1 ([place]) 2005   1.4 cm Poorly differentiated Squamous cell RUL  T1N0 resected 09/22/2003  . On home oxygen therapy    "3L q night and prn during the day" (12/22/2014)  . Pelvic cyst   . Pneumonia    "this is the 3rd time that I can remember" (12/22/2014)  . Unspecified hypothyroidism     Past Surgical History:  Procedure Laterality Date  . ABDOMINAL HYSTERECTOMY  ~ 1976  . BACK SURGERY    . CATARACT EXTRACTION W/ INTRAOCULAR LENS  IMPLANT, BILATERAL Bilateral ~ 2012  . DILATION AND CURETTAGE OF UTERUS    . LUMBAR DISC SURGERY  1970's  . LYMPH NODE DISSECTION  2005   "all my lymph nodes under breasts, went thru my back; Dr. Arlyce Dice did it"  . Needle aspiration Pelvic cyst  2011   Dr Diona Fanti  . VIDEO ASSISTED THORACOSCOPY (VATS)/THOROCOTOMY Right 09/2003   thoracotomy, right upper lobectomy with node dissection; Dr Demetrios Loll 11/02/2010  . VIDEO BRONCHOSCOPY  03/2004   Archie Endo 11/02/2010    Social History   Socioeconomic History  . Marital status: Married    Spouse name: Not on file  . Number of children: 2  . Years of education: 68  . Highest education level: High school graduate  Occupational History  . Occupation: Retired Animator, School system    Employer: RETIRED  Tobacco Use  . Smoking status: Former Smoker    Packs/day: 1.00    Years: 42.00    Pack years: 42.00    Types: Cigarettes    Quit date: 09/22/2003     Years since quitting: 16.1  . Smokeless tobacco: Never Used  Substance and Sexual Activity  . Alcohol use: Yes    Alcohol/week: 14.0 standard drinks    Types: 14 Glasses of wine per week    Comment: 12/22/2014 "I have a large glass of wine qd"  . Drug use: No  . Sexual activity: Never  Other Topics Concern  . Not on file  Social History Narrative   Lives with husband in a one story home.  Has 2 children.  Retired.  Education: high school.    Social Determinants of Health   Financial Resource Strain:   . Difficulty of Paying Living Expenses:  Food Insecurity:   . Worried About Charity fundraiser in the Last Year:   . Arboriculturist in the Last Year:   Transportation Needs:   . Film/video editor (Medical):   [name] Lack of Transportation (Non-Medical):   Physical Activity:   . Days of Exercise per Week:   . Minutes of Exercise per Session:   Stress:   . Feeling of Stress :   Social Connections:   . Frequency of Communication with Friends and Family:   . Frequency of Social Gatherings with Friends and Family:   . Attends Religious Services:   . Active Member of Clubs or Organizations:   . Attends Archivist Meetings:   [name] Marital Status:     Family History  Problem Relation Age of Onset  . Heart disease Father   . Rheumatologic disease Brother   . Cancer Brother        Leukemia  . Rheumatologic disease Sister   . Breast cancer Daughter     Review of Systems  Constitutional: Positive for appetite change (decreased). Negative for chills and fever.  Respiratory: Positive for cough (chronic but worse - white- yellow in color), shortness of breath and wheezing.   Cardiovascular: Positive for leg swelling (R foot). Negative for chest pain and palpitations.  Gastrointestinal: Positive for abdominal pain (a little at times - soreness), diarrhea (diarrhea or soft stool) and nausea. Negative for blood in stool (dark stool - takes pepto for nausea) and  constipation.       Burps a lot, GERD with eating  Musculoskeletal: Positive for arthralgias.  Neurological: Positive for headaches (occ). Negative for light-headedness.       Objective:   Vitals:   11/05/19 1403  BP: (!) 150/78  Pulse: 67  Resp: 18  Temp: 98.5 F (36.9 C)  SpO2: 99%   BP Readings from Last 3 Encounters:  11/05/19 (!) 150/78  08/27/19 120/64  05/27/19 139/82   Wt Readings from Last 3 Encounters:  11/05/19 128 lb 12.8 oz (58.4 kg)  08/27/19 130 lb (59 kg)  02/26/19 142 lb (64.4 kg)   Body mass index is 20.79 kg/m.   Physical Exam Constitutional:      General: She is not in acute distress.    Appearance: Normal appearance. She is not ill-appearing.  HENT:     Head: Normocephalic and atraumatic.  Eyes:     General: No scleral icterus.    Conjunctiva/sclera: Conjunctivae normal.  Cardiovascular:     Rate and Rhythm: Normal rate and regular rhythm.  Pulmonary:     Effort: Pulmonary effort is normal. No respiratory distress.     Breath sounds: Wheezing (Diffuse expiratory wheeze) present.  Abdominal:     General: There is no distension.     Palpations: Abdomen is soft.     Tenderness: There is no abdominal tenderness. There is no guarding or rebound.  Musculoskeletal:     Cervical back: Neck supple.     Right lower leg: No edema.     Left lower leg: No edema.  Skin:    General: Skin is warm and dry.  Neurological:     Mental Status: She is alert.            Assessment & Plan:    See Problem List for Assessment and Plan of chronic medical problems.    This visit occurred during the SARS-CoV-2 public health emergency.  Safety protocols were in place, including screening questions prior  to the visit, additional usage of staff PPE, and extensive cleaning of exam room while observing appropriate contact time as indicated for disinfecting solutions.

## 2019-11-05 ENCOUNTER — Ambulatory Visit (INDEPENDENT_AMBULATORY_CARE_PROVIDER_SITE_OTHER): Payer: PPO | Admitting: Internal Medicine

## 2019-11-05 ENCOUNTER — Other Ambulatory Visit: Payer: Self-pay

## 2019-11-05 ENCOUNTER — Encounter: Payer: Self-pay | Admitting: Internal Medicine

## 2019-11-05 VITALS — BP 150/78 | HR 67 | Temp 98.5°F | Resp 18 | Ht 66.0 in | Wt 128.8 lb

## 2019-11-05 DIAGNOSIS — K219 Gastro-esophageal reflux disease without esophagitis: Secondary | ICD-10-CM | POA: Insufficient documentation

## 2019-11-05 DIAGNOSIS — R739 Hyperglycemia, unspecified: Secondary | ICD-10-CM | POA: Diagnosis not present

## 2019-11-05 DIAGNOSIS — J441 Chronic obstructive pulmonary disease with (acute) exacerbation: Secondary | ICD-10-CM | POA: Diagnosis not present

## 2019-11-05 DIAGNOSIS — F419 Anxiety disorder, unspecified: Secondary | ICD-10-CM | POA: Diagnosis not present

## 2019-11-05 DIAGNOSIS — E785 Hyperlipidemia, unspecified: Secondary | ICD-10-CM | POA: Diagnosis not present

## 2019-11-05 DIAGNOSIS — R195 Other fecal abnormalities: Secondary | ICD-10-CM | POA: Diagnosis not present

## 2019-11-05 DIAGNOSIS — K2100 Gastro-esophageal reflux disease with esophagitis, without bleeding: Secondary | ICD-10-CM

## 2019-11-05 DIAGNOSIS — E038 Other specified hypothyroidism: Secondary | ICD-10-CM | POA: Diagnosis not present

## 2019-11-05 LAB — COMPREHENSIVE METABOLIC PANEL
ALT: 13 U/L (ref 0–35)
AST: 26 U/L (ref 0–37)
Albumin: 3.4 g/dL — ABNORMAL LOW (ref 3.5–5.2)
Alkaline Phosphatase: 128 U/L — ABNORMAL HIGH (ref 39–117)
BUN: 6 mg/dL (ref 6–23)
CO2: 36 mEq/L — ABNORMAL HIGH (ref 19–32)
Calcium: 9.5 mg/dL (ref 8.4–10.5)
Chloride: 94 mEq/L — ABNORMAL LOW (ref 96–112)
Creatinine, Ser: 0.62 mg/dL (ref 0.40–1.20)
GFR: 91.58 mL/min (ref >60.00)
Glucose, Bld: 104 mg/dL — ABNORMAL HIGH (ref 70–99)
Potassium: 4.2 mEq/L (ref 3.5–5.1)
Sodium: 132 mEq/L — ABNORMAL LOW (ref 135–145)
Total Bilirubin: 0.7 mg/dL (ref 0.2–1.2)
Total Protein: 6.8 g/dL (ref 6.0–8.3)

## 2019-11-05 LAB — CBC WITH DIFFERENTIAL/PLATELET
Basophils Absolute: 0 10*3/uL (ref 0.0–0.1)
Basophils Relative: 0.4 % (ref 0.0–3.0)
Eosinophils Absolute: 0 10*3/uL (ref 0.0–0.7)
Eosinophils Relative: 0.1 % (ref 0.0–5.0)
HCT: 33.2 % — ABNORMAL LOW (ref 36.0–46.0)
Hemoglobin: 11.1 g/dL — ABNORMAL LOW (ref 12.0–15.0)
Lymphocytes Relative: 18.8 % (ref 12.0–46.0)
Lymphs Abs: 1.3 10*3/uL (ref 0.7–4.0)
MCHC: 33.6 g/dL (ref 30.0–36.0)
MCV: 96.8 fl (ref 78.0–100.0)
Monocytes Absolute: 0.6 10*3/uL (ref 0.1–1.0)
Monocytes Relative: 9.4 % (ref 3.0–12.0)
Neutro Abs: 4.9 10*3/uL (ref 1.4–7.7)
Neutrophils Relative %: 71.3 % (ref 43.0–77.0)
Platelets: 193 10*3/uL (ref 150.0–400.0)
RBC: 3.43 Mil/uL — ABNORMAL LOW (ref 3.87–5.11)
RDW: 13.8 % (ref 11.5–15.5)
WBC: 6.9 10*3/uL (ref 4.0–10.5)

## 2019-11-05 LAB — LIPID PANEL
Cholesterol: 183 mg/dL (ref 0–200)
HDL: 87.5 mg/dL (ref >39.00)
LDL Cholesterol: 81 mg/dL (ref 0–99)
NonHDL: 95.05
Total CHOL/HDL Ratio: 2
Triglycerides: 69 mg/dL (ref 0.0–149.0)
VLDL: 13.8 mg/dL (ref 0.0–40.0)

## 2019-11-05 LAB — MAGNESIUM: Magnesium: 1.7 mg/dL (ref 1.5–2.5)

## 2019-11-05 LAB — HEMOGLOBIN A1C: Hgb A1c MFr Bld: 4.7 % (ref 4.6–6.5)

## 2019-11-05 MED ORDER — METHYLPREDNISOLONE ACETATE 80 MG/ML IJ SUSP
80.0000 mg | Freq: Once | INTRAMUSCULAR | Status: AC
  Administered 2019-11-05: 80 mg via INTRAMUSCULAR

## 2019-11-05 MED ORDER — PANTOPRAZOLE SODIUM 40 MG PO TBEC: 40.0000 mg | DELAYED_RELEASE_TABLET | Freq: Two times a day (BID) | ORAL | 3 refills | Status: DC

## 2019-11-05 NOTE — Assessment & Plan Note (Signed)
History of low magnesium We will check level

## 2019-11-05 NOTE — Patient Instructions (Addendum)
  Blood work was ordered.     You received a steroid injection today.  Medications reviewed and updated.  Changes include :   Start pantoprazole 40 mg twice daily.    Your prescription(s) have been submitted to your pharmacy. Please take as directed and contact our office if you believe you are having problem(s) with the medication(s).     Please followup in 6 months

## 2019-11-05 NOTE — Assessment & Plan Note (Signed)
Chronic Stable, controlled Continue Xanax nightly

## 2019-11-05 NOTE — Assessment & Plan Note (Signed)
Chronic Check A1c

## 2019-11-05 NOTE — Assessment & Plan Note (Signed)
New problem Having reflux frequently, decreased appetite, nausea and regurgitation Symptoms concerning for GERD, possible gastritis, upper GI bleed because she is also experiencing dark stools Taking Pepto Check CBC, iron panel Start pantoprazole 40 mg twice daily before meals May need to see GI

## 2019-11-05 NOTE — Assessment & Plan Note (Signed)
Acute on chronic Symptoms now consistent with acute exacerbation of COPD/respiratory failure Depo-Medrol 80 mg IM x1 We will hold off on antibiotics-we will feel there is no active infection Continue nebulizer treatments and inhalers If there is no improvement will follow up with me or pulmonary

## 2019-11-05 NOTE — Assessment & Plan Note (Signed)
Chronic  Clinically euthyroid Check tsh  Titrate med dose if needed  

## 2019-11-05 NOTE — Assessment & Plan Note (Signed)
Acute Stools are dark, but she is taking Pepto-Bismol Some of her symptoms are concerning for possible upper GI bleed with GERD, nausea, decreased appetite, reflux resulting in vomiting Start pantoprazole 40 mg twice daily CBC, iron panel May need to see GI

## 2019-11-05 NOTE — Assessment & Plan Note (Signed)
Chronic Check lipid panel  Continue daily statin

## 2019-11-06 LAB — IRON,TIBC AND FERRITIN PANEL
%SAT: 23 % (calc) (ref 16–45)
Ferritin: 215 ng/mL (ref 16–288)
Iron: 58 ug/dL (ref 45–160)
TIBC: 256 mcg/dL (calc) (ref 250–450)

## 2019-11-06 LAB — TSH: TSH: 1.46 u[IU]/mL (ref 0.35–4.50)

## 2019-11-08 ENCOUNTER — Telehealth: Payer: Self-pay | Admitting: Internal Medicine

## 2019-11-08 ENCOUNTER — Telehealth: Payer: Self-pay

## 2019-11-08 NOTE — Telephone Encounter (Signed)
[name] with [facility] called and is requesting verbal orders for a nursing evaluation

## 2019-11-08 NOTE — Telephone Encounter (Signed)
Pt aware of results 

## 2019-11-08 NOTE — Telephone Encounter (Signed)
New message    Calling for test results  

## 2019-11-08 NOTE — Telephone Encounter (Signed)
LVM giving ok for orders.  

## 2019-11-15 ENCOUNTER — Other Ambulatory Visit: Payer: Self-pay | Admitting: Internal Medicine

## 2019-11-15 NOTE — Telephone Encounter (Signed)
Can this be done today only have 1 pill left   1.Medication Requested:ALPRAZolam (XANAX) 1 MG tablet  2. Pharmacy (Name, Street, City):[id number] - [place], [address]  3. On Med List: Yes   4. Last Visit with PCP: 5.18.2021   5. Next visit date with PCP: 11.16.21    Agent: Please be advised that RX refills may take up to 3 business days. We ask that you follow-up with your pharmacy.

## 2019-11-15 NOTE — Telephone Encounter (Signed)
Last RF 10/15/19 Last OV 11/05/19 Next OV 05/05/20

## 2019-11-15 NOTE — Telephone Encounter (Signed)
New message:    Pt is calling and would like to know the status of her refill. I have advised the pt in can take 3 days to get the prescription filled. She states she only has one pill left.

## 2019-11-25 ENCOUNTER — Telehealth: Payer: Self-pay | Admitting: Internal Medicine

## 2019-11-25 NOTE — Telephone Encounter (Addendum)
    Patient wants to know if pantoprazole (PROTONIX) 40 MG tablet could be causing diarrhea. Patient stopped taking medication 2 days ago.  Please advise

## 2019-11-25 NOTE — Telephone Encounter (Signed)
Spoke with patient and info given 

## 2019-11-25 NOTE — Telephone Encounter (Signed)
Yes it is possible - the diarrhea should go away in the next couple of days - if it does not then it was not likely the cause

## 2019-11-26 ENCOUNTER — Emergency Department (HOSPITAL_COMMUNITY): Payer: PPO

## 2019-11-26 ENCOUNTER — Encounter (HOSPITAL_COMMUNITY): Payer: Self-pay

## 2019-11-26 ENCOUNTER — Emergency Department (HOSPITAL_COMMUNITY)
Admission: EM | Admit: 2019-11-26 | Discharge: 2019-11-27 | Disposition: A | Discharge status: Home / Self Care | Payer: PPO | Attending: Emergency Medicine | Admitting: Emergency Medicine

## 2019-11-26 ENCOUNTER — Other Ambulatory Visit: Payer: Self-pay

## 2019-11-26 DIAGNOSIS — Z7951 Long term (current) use of inhaled steroids: Secondary | ICD-10-CM | POA: Insufficient documentation

## 2019-11-26 DIAGNOSIS — R1084 Generalized abdominal pain: Secondary | ICD-10-CM | POA: Diagnosis not present

## 2019-11-26 DIAGNOSIS — K5730 Diverticulosis of large intestine without perforation or abscess without bleeding: Secondary | ICD-10-CM | POA: Diagnosis not present

## 2019-11-26 DIAGNOSIS — R112 Nausea with vomiting, unspecified: Secondary | ICD-10-CM | POA: Diagnosis not present

## 2019-11-26 DIAGNOSIS — E039 Hypothyroidism, unspecified: Secondary | ICD-10-CM | POA: Diagnosis not present

## 2019-11-26 DIAGNOSIS — Z85118 Personal history of other malignant neoplasm of bronchus and lung: Secondary | ICD-10-CM | POA: Diagnosis not present

## 2019-11-26 DIAGNOSIS — J441 Chronic obstructive pulmonary disease with (acute) exacerbation: Secondary | ICD-10-CM | POA: Insufficient documentation

## 2019-11-26 DIAGNOSIS — Z20822 Contact with and (suspected) exposure to covid-19: Secondary | ICD-10-CM | POA: Insufficient documentation

## 2019-11-26 DIAGNOSIS — Z881 Allergy status to other antibiotic agents status: Secondary | ICD-10-CM | POA: Insufficient documentation

## 2019-11-26 DIAGNOSIS — Z79899 Other long term (current) drug therapy: Secondary | ICD-10-CM | POA: Insufficient documentation

## 2019-11-26 DIAGNOSIS — I10 Essential (primary) hypertension: Secondary | ICD-10-CM | POA: Diagnosis not present

## 2019-11-26 DIAGNOSIS — Z87891 Personal history of nicotine dependence: Secondary | ICD-10-CM | POA: Diagnosis not present

## 2019-11-26 DIAGNOSIS — J439 Emphysema, unspecified: Secondary | ICD-10-CM | POA: Diagnosis not present

## 2019-11-26 DIAGNOSIS — R52 Pain, unspecified: Secondary | ICD-10-CM | POA: Diagnosis not present

## 2019-11-26 DIAGNOSIS — Z91040 Latex allergy status: Secondary | ICD-10-CM | POA: Diagnosis not present

## 2019-11-26 DIAGNOSIS — J9611 Chronic respiratory failure with hypoxia: Secondary | ICD-10-CM | POA: Insufficient documentation

## 2019-11-26 DIAGNOSIS — R531 Weakness: Secondary | ICD-10-CM | POA: Diagnosis not present

## 2019-11-26 DIAGNOSIS — J45909 Unspecified asthma, uncomplicated: Secondary | ICD-10-CM | POA: Insufficient documentation

## 2019-11-26 DIAGNOSIS — R000 Tachycardia, unspecified: Secondary | ICD-10-CM | POA: Diagnosis not present

## 2019-11-26 DIAGNOSIS — Z888 Allergy status to other drugs, medicaments and biological substances status: Secondary | ICD-10-CM | POA: Insufficient documentation

## 2019-11-26 DIAGNOSIS — Z880 Allergy status to penicillin: Secondary | ICD-10-CM | POA: Diagnosis not present

## 2019-11-26 DIAGNOSIS — N134 Hydroureter: Secondary | ICD-10-CM | POA: Diagnosis not present

## 2019-11-26 DIAGNOSIS — Z886 Allergy status to analgesic agent status: Secondary | ICD-10-CM | POA: Insufficient documentation

## 2019-11-26 DIAGNOSIS — R1110 Vomiting, unspecified: Secondary | ICD-10-CM | POA: Diagnosis not present

## 2019-11-26 DIAGNOSIS — R110 Nausea: Secondary | ICD-10-CM | POA: Diagnosis not present

## 2019-11-26 LAB — CBC WITH DIFFERENTIAL/PLATELET
Abs Immature Granulocytes: 0.01 10*3/uL (ref 0.00–0.07)
Basophils Absolute: 0 10*3/uL (ref 0.0–0.1)
Basophils Relative: 1 %
Eosinophils Absolute: 0 10*3/uL (ref 0.0–0.5)
Eosinophils Relative: 0 %
HCT: 33.2 % — ABNORMAL LOW (ref 36.0–46.0)
Hemoglobin: 10.7 g/dL — ABNORMAL LOW (ref 12.0–15.0)
Immature Granulocytes: 0 %
Lymphocytes Relative: 14 %
Lymphs Abs: 0.7 10*3/uL (ref 0.7–4.0)
MCH: 32 pg (ref 26.0–34.0)
MCHC: 32.2 g/dL (ref 30.0–36.0)
MCV: 99.4 fL (ref 80.0–100.0)
Monocytes Absolute: 0.5 10*3/uL (ref 0.1–1.0)
Monocytes Relative: 11 %
Neutro Abs: 3.6 10*3/uL (ref 1.7–7.7)
Neutrophils Relative %: 74 %
Platelets: 180 10*3/uL (ref 150–400)
RBC: 3.34 MIL/uL — ABNORMAL LOW (ref 3.87–5.11)
RDW: 12.8 % (ref 11.5–15.5)
WBC: 4.8 10*3/uL (ref 4.0–10.5)
nRBC: 0 % (ref 0.0–0.2)

## 2019-11-26 LAB — URINALYSIS, ROUTINE W REFLEX MICROSCOPIC
Bilirubin Urine: NEGATIVE
Glucose, UA: NEGATIVE mg/dL
Hgb urine dipstick: NEGATIVE
Ketones, ur: NEGATIVE mg/dL
Leukocytes,Ua: NEGATIVE
Nitrite: NEGATIVE
Protein, ur: NEGATIVE mg/dL
Specific Gravity, Urine: 1.004 — ABNORMAL LOW (ref 1.005–1.030)
pH: 6 (ref 5.0–8.0)

## 2019-11-26 LAB — COMPREHENSIVE METABOLIC PANEL
ALT: 31 U/L (ref 0–44)
AST: 69 U/L — ABNORMAL HIGH (ref 15–41)
Albumin: 3 g/dL — ABNORMAL LOW (ref 3.5–5.0)
Alkaline Phosphatase: 127 U/L — ABNORMAL HIGH (ref 38–126)
Anion gap: 9 (ref 5–15)
BUN: 7 mg/dL — ABNORMAL LOW (ref 8–23)
CO2: 29 mmol/L (ref 22–32)
Calcium: 8.3 mg/dL — ABNORMAL LOW (ref 8.9–10.3)
Chloride: 94 mmol/L — ABNORMAL LOW (ref 98–111)
Creatinine, Ser: 0.66 mg/dL (ref 0.44–1.00)
GFR calc Af Amer: >60 mL/min (ref >60)
GFR calc non Af Amer: >60 mL/min (ref >60)
Glucose, Bld: 110 mg/dL — ABNORMAL HIGH (ref 70–99)
Potassium: 3.6 mmol/L (ref 3.5–5.1)
Sodium: 132 mmol/L — ABNORMAL LOW (ref 135–145)
Total Bilirubin: 0.7 mg/dL (ref 0.3–1.2)
Total Protein: 6.4 g/dL — ABNORMAL LOW (ref 6.5–8.1)

## 2019-11-26 LAB — TROPONIN I (HIGH SENSITIVITY): Troponin I (High Sensitivity): 11 ng/L (ref <18)

## 2019-11-26 LAB — LIPASE, BLOOD: Lipase: 28 U/L (ref 11–51)

## 2019-11-26 LAB — SARS CORONAVIRUS 2 BY RT PCR (HOSPITAL ORDER, PERFORMED IN ~~LOC~~ HOSPITAL LAB): SARS Coronavirus 2: NEGATIVE

## 2019-11-26 MED ORDER — METHYLPREDNISOLONE SODIUM SUCC 125 MG IJ SOLR
80.0000 mg | Freq: Once | INTRAMUSCULAR | Status: AC
  Administered 2019-11-26: 80 mg via INTRAVENOUS
  Filled 2019-11-26: qty 2

## 2019-11-26 MED ORDER — SODIUM CHLORIDE 0.9 % IV BOLUS
500.0000 mL | Freq: Once | INTRAVENOUS | Status: AC
  Administered 2019-11-26: 500 mL via INTRAVENOUS

## 2019-11-26 MED ORDER — ALBUTEROL SULFATE HFA 108 (90 BASE) MCG/ACT IN AERS
2.0000 | INHALATION_SPRAY | Freq: Once | RESPIRATORY_TRACT | Status: AC
  Administered 2019-11-26: 2 via RESPIRATORY_TRACT
  Filled 2019-11-26: qty 6.7

## 2019-11-26 NOTE — ED Provider Notes (Addendum)
[facility] DEPT Provider Note   CSN: [id number] Arrival date & time: 11/26/19  1950     History Chief Complaint  Patient presents with  . Weakness    [name] is a 86 y.o. female.  The history is provided by the patient and medical records. No language interpreter was used.  Weakness  [name] is a 86 y.o. female who presents to the Emergency Department complaining of nausea.  She presents to the Emergency Department by EMS complaining of three days of nausea and cough.  She has severe nausea with poor appetite.  No vomiting.  She has a chronic cough, worse today than her baseline.  Cough is productive of white/yellow sputum.  She has generalized weakness.  Denies fever, chest pain, diarrhea, constipation, bloody stools, dysuria.  She has associated generalized abdominal discomfort (starting today).   Lives with her husband.  Has a hx/o COPD, on 3 L oxygen at baseline.      Past Medical History:  Diagnosis Date  . Allergic rhinitis   . Anxiety   . Arthritis    "back, hands; hips" (12/22/2014)  . Asthma   . Bladder atony   . Chronic bronchitis ([place])   . COPD (chronic obstructive pulmonary disease) ([place])   . Diarrhea   . Emphysema of lung ([place])   . GERD (gastroesophageal reflux disease)    "w/spicey foods" (12/22/2014)  . Hyperlipidemia   . Non-small cell carcinoma of lung, stage 1 ([place]) 2005   1.4 cm Poorly differentiated Squamous cell RUL  T1N0 resected 09/22/2003  . On home oxygen therapy    "3L q night and prn during the day" (12/22/2014)  . Pelvic cyst   . Pneumonia    "this is the 3rd time that I can remember" (12/22/2014)  . Unspecified hypothyroidism     Patient Active Problem List   Diagnosis Date Noted  . Dark stools 11/05/2019  . GERD (gastroesophageal reflux disease) 11/05/2019  . Hypomagnesemia 10/14/2019  . Skin tear of left upper arm without complication 09/47/0962  . COPD with acute exacerbation ([place])  05/22/2019  . Urinary incontinence due to immobility 05/22/2019  . Non-healing wound of lower extremity 05/03/2019  . Multiple skin tears 03/02/2019  . Weight loss 11/20/2018  . Depression 11/20/2018  . Recurrent falls 11/20/2018  . Parotiditis 09/17/2018  . Primary osteoarthritis of right hip 06/21/2018  . Degenerative disc disease, cervical 12/06/2017  . DOE (dyspnea on exertion) 11/21/2017  . Bilateral swelling of feet 11/02/2017  . Osteopenia 02/16/2017  . Hyperglycemia 12/08/2016  . Degenerative arthritis of right knee 10/27/2016  . 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 Greater trochanteric bursitis of both hips 04/08/2016  . Pain in both knees 04/08/2016  . Chronic midline low back pain without sciatica 04/08/2016  . Chronic respiratory failure with hypoxia ([place]) 03/07/2015  . Diverticulitis 05/24/2014  . Anxiety   . Bladder prolapse, female, acquired   . Non-small cell carcinoma of lung, stage 1 ([place]) 01/10/2012  . Neuropathy, peripheral 01/28/2011  . COPD mixed type ([place]) 09/08/2010  . Dyslipidemia   . ATONY OF BLADDER 08/10/2009  . Hypothyroidism 03/12/2008  . Seasonal allergic rhinitis 10/10/2007    Past Surgical History:  Procedure Laterality Date  . ABDOMINAL HYSTERECTOMY  ~ 1976  . BACK SURGERY    . CATARACT EXTRACTION W/ INTRAOCULAR LENS  IMPLANT, BILATERAL Bilateral ~ 2012  . DILATION AND CURETTAGE OF UTERUS    . LUMBAR  [facility]GERY  1970's  . LYMPH NODE DISSECTION  2005   "all my lymph nodes under breasts, went thru my back; Dr. Arlyce Dice did it"  . Needle aspiration Pelvic cyst  2011   Dr Diona Fanti  . VIDEO ASSISTED THORACOSCOPY (VATS)/THOROCOTOMY Right 09/2003   thoracotomy, right upper lobectomy with node dissection; Dr Demetrios Loll 11/02/2010  . VIDEO BRONCHOSCOPY  03/2004   Archie Endo 11/02/2010     OB History   No obstetric history on file.     Family History  Problem Relation Age of Onset  . Heart disease  Father   . Rheumatologic disease Brother   . Cancer Brother        Leukemia  . Rheumatologic disease Sister   . Breast cancer Daughter     Social History   Tobacco Use  . Smoking status: Former Smoker    Packs/day: 1.00    Years: 42.00    Pack years: 42.00    Types: Cigarettes    Quit date: 09/22/2003    Years since quitting: 16.1  . Smokeless tobacco: Never Used  Substance Use Topics  . Alcohol use: Yes    Alcohol/week: 14.0 standard drinks    Types: 14 Glasses of wine per week    Comment: 12/22/2014 "I have a large glass of wine qd"  . Drug use: No    Home Medications Prior to Admission medications   Medication Sig Start Date End Date Taking? Authorizing Provider  ALPRAZolam (XANAX) 1 MG tablet TAKE 1 TABLET BY MOUTH EVERYDAY AT BEDTIME Patient taking differently: Take 1 mg by mouth at bedtime.  11/15/19  Yes Burns, Claudina Lick, MD  atorvastatin (LIPITOR) 20 MG tablet TAKE 1 TABLET (20 MG TOTAL) BY MOUTH DAILY AT 6 PM. Patient taking differently: Take 20 mg by mouth See admin instructions. Takes on Mon, Wed, and Fri 05/20/19  Yes Burns, Claudina Lick, MD  Cholecalciferol (VITAMIN D) 2000 UNITS tablet Take 1 tablet (2,000 Units total) by mouth daily. 04/29/15  Yes Rowe Clack, MD  FLOVENT HFA 110 MCG/ACT inhaler TAKE 2 PUFFS BY MOUTH TWICE A DAY Patient taking differently: Inhale 2 puffs into the lungs in the morning and at bedtime.  08/06/19  Yes Young, Tarri Fuller D, MD  levalbuterol Penne Lash) 1.25 MG/3ML nebulizer solution Take 1.25 mg by nebulization every 6 (six) hours as needed for wheezing. DX: Bronchiectasis 09/27/19  Yes Young, Tarri Fuller D, MD  levothyroxine (SYNTHROID) 88 MCG tablet TAKE 1 TABLET BY MOUTH EVERY DAY Patient taking differently: Take 88 mcg by mouth daily before breakfast.  09/23/19  Yes Burns, Claudina Lick, MD  Multiple Vitamins-Minerals (CENTRUM SILVER PO) Take 1 tablet by mouth daily.    Yes [provider]  Probiotic Product (PROBIOTIC PO) Take 1 capsule by  mouth daily.   Yes [provider]  pantoprazole (PROTONIX) 40 MG tablet Take 1 tablet (40 mg total) by mouth 2 (two) times daily before a meal. Patient not taking: Reported on 11/26/2019 11/05/19   Binnie Rail, MD  Respiratory Therapy Supplies (FLUTTER) DEVI Blow through 4 times per cycle and repeat 3 cycles per day 10/15/13   Deneise Lever, MD  Respiratory Therapy Supplies (NEBULIZER/TUBING/MOUTHPIECE) KIT As directed 05/24/18   Baird Lyons D, MD    Allergies    Ampicillin, Azithromycin, Codeine, Daliresp [roflumilast], Nitrofurantoin, Other, Penicillins, Sulfonamide derivatives, Tiotropium bromide monohydrate, Cephalosporins, Doxycycline, Protonix [pantoprazole], Fludrocortisone acetate, Lactose intolerance (gi), Latex, and Prednisone  Review of Systems   Review of Systems  Neurological: Positive  for weakness.  All other systems reviewed and are negative.   Physical Exam Updated Vital Signs BP (!) 141/71   Pulse 92   Temp 97.8 F (36.6 C) (Oral)   Resp (!) 22   SpO2 98%   Physical Exam Vitals and nursing note reviewed.  Constitutional:      Appearance: She is well-developed.  HENT:     Head: Normocephalic and atraumatic.  Cardiovascular:     Rate and Rhythm: Regular rhythm. Tachycardia present.     Heart sounds: No murmur.  Pulmonary:     Effort: Pulmonary effort is normal. No respiratory distress.     Comments: Expiratory wheezes bilaterally Abdominal:     Palpations: Abdomen is soft.     Tenderness: There is no abdominal tenderness. There is no guarding or rebound.  Genitourinary:    Comments: Brown stool in rectal vault, heme neg Musculoskeletal:        General: No tenderness.  Skin:    General: Skin is warm and dry.  Neurological:     Mental Status: She is alert and oriented to person, place, and time.  Psychiatric:        Behavior: Behavior normal.     ED Results / Procedures / Treatments   Labs (all labs ordered are listed, but only  abnormal results are displayed) Labs Reviewed  COMPREHENSIVE METABOLIC PANEL - Abnormal; Notable for the following components:      Result Value   Sodium 132 (*)    Chloride 94 (*)    Glucose, Bld 110 (*)    BUN 7 (*)    Calcium 8.3 (*)    Total Protein 6.4 (*)    Albumin 3.0 (*)    AST 69 (*)    Alkaline Phosphatase 127 (*)    All other components within normal limits  CBC WITH DIFFERENTIAL/PLATELET - Abnormal; Notable for the following components:   RBC 3.34 (*)    Hemoglobin 10.7 (*)    HCT 33.2 (*)    All other components within normal limits  URINALYSIS, ROUTINE W REFLEX MICROSCOPIC - Abnormal; Notable for the following components:   Color, Urine STRAW (*)    Specific Gravity, Urine 1.004 (*)    All other components within normal limits  SARS CORONAVIRUS 2 BY RT PCR (HOSPITAL ORDER, [facility])  LIPASE, BLOOD  TROPONIN I (HIGH SENSITIVITY)  TROPONIN I (HIGH SENSITIVITY)    EKG EKG Interpretation  Date/Time:  Tuesday November 26 2019 21:03:56 EDT Ventricular Rate:  105 PR Interval:    QRS Duration: 79 QT Interval:  328 QTC Calculation: 434 R Axis:   65 Text Interpretation: Sinus tachycardia Atrial premature complexes Confirmed by Quintella Reichert [phone]) on 11/26/2019 9:21:45 PM   Radiology DG Chest Port 1 View  Result Date: 11/26/2019 CLINICAL DATA:  Nausea and weakness for several days. EXAM: PORTABLE CHEST 1 VIEW COMPARISON:  PA and lateral chest 08/27/2019 and 05/20/2019. CT chest 02/28/2017. FINDINGS: The lungs are severely emphysematous. Pleural and parenchymal scarring in the right mid and lower lung zones is unchanged. Heart size is normal. Atherosclerosis. No pneumothorax or pleural fluid. IMPRESSION: No acute disease. Aortic Atherosclerosis (ICD10-I70.0) and Emphysema (ICD10-J43.9). Electronically Signed   By: Inge Rise M.D.   On: 11/26/2019 21:29    Procedures Procedures (including critical care time)  Medications Ordered in  ED Medications  albuterol (VENTOLIN HFA) 108 (90 Base) MCG/ACT inhaler 2 puff (2 puffs Inhalation Given 11/26/19 2054)  sodium chloride 0.9 %  bolus 500 mL (500 mLs Intravenous New Bag/Given 11/26/19 2054)  methylPREDNISolone sodium succinate (SOLU-MEDROL) 125 mg/2 mL injection 80 mg (80 mg Intravenous Given 11/26/19 2057)    ED Course  I have reviewed the triage vital signs and the nursing notes.  Pertinent labs & imaging results that were available during my care of the patient were reviewed by me and considered in my medical decision making (see chart for details).    MDM Rules/Calculators/A&P                     Pt with hx/o COPD on home oxygen here for evaluation of cough, nausea, abdominal discomfort.  She is nontoxic appearing on exam. She has expiratory wheezes without distress and was treated with albuterol, solumedrol for possible COPD exacerbation.  On initial exam pt with no significant abdominal tenderness.  On repeat abdominal examination she has mild generalized tenderness.  Plan to obtain CT abdomen/pelvis to further evaluate pain.  Pt care transferred pending CT.    Final Clinical Impression(s) / ED Diagnoses Final diagnoses:  Generalized abdominal pain    Rx / DC Orders ED Discharge Orders    None       Quintella Reichert, MD 11/27/19 Lyn Hollingshead, MD 11/27/19 [phone]

## 2019-11-26 NOTE — ED Notes (Signed)
Pt was able to drink a ginger ale without vomiting

## 2019-11-26 NOTE — ED Triage Notes (Signed)
Pt is from home and complains of nausea for several days, also complains of general weakness, pt states that she is coughing worse than normal and does have a history of COPD, pt on 3 liters O2 at home EMS gave 4mg  zofran 250cc of NS

## 2019-11-27 ENCOUNTER — Encounter (HOSPITAL_COMMUNITY): Payer: Self-pay

## 2019-11-27 ENCOUNTER — Emergency Department (HOSPITAL_COMMUNITY): Payer: PPO

## 2019-11-27 DIAGNOSIS — R110 Nausea: Secondary | ICD-10-CM | POA: Diagnosis not present

## 2019-11-27 DIAGNOSIS — R1110 Vomiting, unspecified: Secondary | ICD-10-CM | POA: Diagnosis not present

## 2019-11-27 DIAGNOSIS — N134 Hydroureter: Secondary | ICD-10-CM | POA: Diagnosis not present

## 2019-11-27 DIAGNOSIS — K5730 Diverticulosis of large intestine without perforation or abscess without bleeding: Secondary | ICD-10-CM | POA: Diagnosis not present

## 2019-11-27 DIAGNOSIS — R112 Nausea with vomiting, unspecified: Secondary | ICD-10-CM | POA: Diagnosis not present

## 2019-11-27 MED ORDER — IOHEXOL 300 MG/ML  SOLN
100.0000 mL | Freq: Once | INTRAMUSCULAR | Status: AC | PRN
  Administered 2019-11-27: 100 mL via INTRAVENOUS

## 2019-11-27 MED ORDER — SODIUM CHLORIDE (PF) 0.9 % IJ SOLN
INTRAMUSCULAR | Status: AC
  Filled 2019-11-27: qty 50

## 2019-11-27 NOTE — ED Provider Notes (Signed)
Care assumed from Dr. Ralene Bathe, patient with abdominal pain pending CT of abdomen and pelvis.  CT shows no acute process.  Labs had shown mild hyponatremia, mild elevation of alkaline phosphatase, mild elevation of AST and mild anemia, all of which are not significantly changed.  Patient is advised of these findings she is discharged with instructions to return should symptoms worsen.  Results for orders placed or performed during the hospital encounter of 11/26/19  SARS Coronavirus 2 by RT PCR (hospital order, performed in [facility] hospital lab) Nasopharyngeal Nasopharyngeal Swab   Specimen: Nasopharyngeal Swab  Result Value Ref Range   SARS Coronavirus 2 NEGATIVE NEGATIVE  Comprehensive metabolic panel  Result Value Ref Range   Sodium 132 (L) 135 - 145 mmol/L   Potassium 3.6 3.5 - 5.1 mmol/L   Chloride 94 (L) 98 - 111 mmol/L   CO2 29 22 - 32 mmol/L   Glucose, Bld 110 (H) 70 - 99 mg/dL   BUN 7 (L) 8 - 23 mg/dL   Creatinine, Ser 0.66 0.44 - 1.00 mg/dL   Calcium 8.3 (L) 8.9 - 10.3 mg/dL   Total Protein 6.4 (L) 6.5 - 8.1 g/dL   Albumin 3.0 (L) 3.5 - 5.0 g/dL   AST 69 (H) 15 - 41 U/L   ALT 31 0 - 44 U/L   Alkaline Phosphatase 127 (H) 38 - 126 U/L   Total Bilirubin 0.7 0.3 - 1.2 mg/dL   GFR calc non Af Amer >60 >60 mL/min   GFR calc Af Amer >60 >60 mL/min   Anion gap 9 5 - 15  CBC with Differential  Result Value Ref Range   WBC 4.8 4.0 - 10.5 K/uL   RBC 3.34 (L) 3.87 - 5.11 MIL/uL   Hemoglobin 10.7 (L) 12.0 - 15.0 g/dL   HCT 33.2 (L) 36.0 - 46.0 %   MCV 99.4 80.0 - 100.0 fL   MCH 32.0 26.0 - 34.0 pg   MCHC 32.2 30.0 - 36.0 g/dL   RDW 12.8 11.5 - 15.5 %   Platelets 180 150 - 400 K/uL   nRBC 0.0 0.0 - 0.2 %   Neutrophils Relative % 74 %   Neutro Abs 3.6 1.7 - 7.7 K/uL   Lymphocytes Relative 14 %   Lymphs Abs 0.7 0.7 - 4.0 K/uL   Monocytes Relative 11 %   Monocytes Absolute 0.5 0.1 - 1.0 K/uL   Eosinophils Relative 0 %   Eosinophils Absolute 0.0 0.0 - 0.5 K/uL   Basophils  Relative 1 %   Basophils Absolute 0.0 0.0 - 0.1 K/uL   Immature Granulocytes 0 %   Abs Immature Granulocytes 0.01 0.00 - 0.07 K/uL  Urinalysis, Routine w reflex microscopic  Result Value Ref Range   Color, Urine STRAW (A) YELLOW   APPearance CLEAR CLEAR   Specific Gravity, Urine 1.004 (L) 1.005 - 1.030   pH 6.0 5.0 - 8.0   Glucose, UA NEGATIVE NEGATIVE mg/dL   Hgb urine dipstick NEGATIVE NEGATIVE   Bilirubin Urine NEGATIVE NEGATIVE   Ketones, ur NEGATIVE NEGATIVE mg/dL   Protein, ur NEGATIVE NEGATIVE mg/dL   Nitrite NEGATIVE NEGATIVE   Leukocytes,Ua NEGATIVE NEGATIVE  Lipase, blood  Result Value Ref Range   Lipase 28 11 - 51 U/L  Troponin I (High Sensitivity)  Result Value Ref Range   Troponin I (High Sensitivity) 11 <18 ng/L   CT Abdomen Pelvis W Contrast  Result Date: 11/27/2019 CLINICAL DATA:  Nausea and vomiting EXAM: CT ABDOMEN AND PELVIS WITH  CONTRAST TECHNIQUE: Multidetector CT imaging of the abdomen and pelvis was performed using the standard protocol following bolus administration of intravenous contrast. CONTRAST:  117mL OMNIPAQUE IOHEXOL 300 MG/ML  SOLN COMPARISON:  05/23/2014 FINDINGS: LOWER CHEST: Unchanged scarring at the right lung base with pleural calcification. HEPATOBILIARY: Normal hepatic contours. No intra- or extrahepatic biliary dilatation. The gallbladder is normal. PANCREAS: Normal pancreas. No ductal dilatation or peripancreatic fluid collection. SPLEEN: Normal. ADRENALS/URINARY TRACT: The adrenal glands are normal. No hydronephrosis, nephroureterolithiasis or solid renal mass. The urinary bladder is normal for degree of distention STOMACH/BOWEL: There is no hiatal hernia. Normal duodenal course and caliber. No small bowel dilatation or inflammation. Rectosigmoid diverticulosis without acute inflammation. Normal appendix. VASCULAR/LYMPHATIC: There is calcific atherosclerosis of the abdominal aorta. No abdominal or pelvic lymphadenopathy. REPRODUCTIVE: No adnexal  mass.  Status post hysterectomy. MUSCULOSKELETAL. No bony spinal canal stenosis or focal osseous abnormality. OTHER: None. IMPRESSION: 1. No acute abnormality of the abdomen or pelvis. 2. Rectosigmoid diverticulosis without acute inflammation. 3. Aortic Atherosclerosis (ICD10-I70.0). Electronically Signed   By: Ulyses Jarred M.D.   On: 11/27/2019 03:15   DG Chest Port 1 View  Result Date: 11/26/2019 CLINICAL DATA:  Nausea and weakness for several days. EXAM: PORTABLE CHEST 1 VIEW COMPARISON:  PA and lateral chest 08/27/2019 and 05/20/2019. CT chest 02/28/2017. FINDINGS: The lungs are severely emphysematous. Pleural and parenchymal scarring in the right mid and lower lung zones is unchanged. Heart size is normal. Atherosclerosis. No pneumothorax or pleural fluid. IMPRESSION: No acute disease. Aortic Atherosclerosis (ICD10-I70.0) and Emphysema (ICD10-J43.9). Electronically Signed   By: Inge Rise M.D.   On: 11/26/2019 21:29   Images viewed by me.    Delora Fuel, MD 44/01/02 [phone]

## 2019-11-27 NOTE — Discharge Instructions (Signed)
Return if symptoms are getting worse. °

## 2019-12-16 ENCOUNTER — Other Ambulatory Visit: Payer: Self-pay | Admitting: Internal Medicine

## 2019-12-25 ENCOUNTER — Other Ambulatory Visit: Payer: Self-pay

## 2019-12-25 ENCOUNTER — Telehealth: Payer: Self-pay | Admitting: Internal Medicine

## 2019-12-25 MED ORDER — LEVOTHYROXINE SODIUM 88 MCG PO TABS: 88.0000 ug | ORAL_TABLET | Freq: Every day | ORAL | 0 refills | Status: DC

## 2019-12-25 NOTE — Telephone Encounter (Signed)
   Patient request refill levothyroxine (SYNTHROID) 88 MCG tablet Pharmacy [id number] - [place], [place].

## 2019-12-25 NOTE — Telephone Encounter (Signed)
Sent in today 

## 2020-01-01 ENCOUNTER — Ambulatory Visit (INDEPENDENT_AMBULATORY_CARE_PROVIDER_SITE_OTHER): Payer: PPO | Admitting: Internal Medicine

## 2020-01-01 ENCOUNTER — Encounter: Payer: Self-pay | Admitting: Internal Medicine

## 2020-01-01 ENCOUNTER — Other Ambulatory Visit: Payer: Self-pay

## 2020-01-01 ENCOUNTER — Ambulatory Visit: Payer: PPO | Admitting: Internal Medicine

## 2020-01-01 ENCOUNTER — Ambulatory Visit
Admission: RE | Admit: 2020-01-01 | Discharge: 2020-01-01 | Disposition: A | Discharge status: Home / Self Care | Payer: PPO | Source: Ambulatory Visit | Attending: Internal Medicine | Admitting: Internal Medicine

## 2020-01-01 VITALS — BP 132/72 | HR 61 | Temp 97.9°F | Ht 66.0 in | Wt 133.0 lb

## 2020-01-01 DIAGNOSIS — R1032 Left lower quadrant pain: Secondary | ICD-10-CM | POA: Diagnosis not present

## 2020-01-01 DIAGNOSIS — K921 Melena: Secondary | ICD-10-CM

## 2020-01-01 DIAGNOSIS — I700 Atherosclerosis of aorta: Secondary | ICD-10-CM | POA: Diagnosis not present

## 2020-01-01 DIAGNOSIS — K5730 Diverticulosis of large intestine without perforation or abscess without bleeding: Secondary | ICD-10-CM | POA: Diagnosis not present

## 2020-01-01 DIAGNOSIS — R197 Diarrhea, unspecified: Secondary | ICD-10-CM | POA: Diagnosis not present

## 2020-01-01 DIAGNOSIS — M5136 Other intervertebral disc degeneration, lumbar region: Secondary | ICD-10-CM | POA: Diagnosis not present

## 2020-01-01 DIAGNOSIS — M1611 Unilateral primary osteoarthritis, right hip: Secondary | ICD-10-CM | POA: Diagnosis not present

## 2020-01-01 LAB — COMPREHENSIVE METABOLIC PANEL
AG Ratio: 1.1 (calc) (ref 1.0–2.5)
ALT: 25 U/L (ref 6–29)
AST: 45 U/L — ABNORMAL HIGH (ref 10–35)
Albumin: 3.2 g/dL — ABNORMAL LOW (ref 3.6–5.1)
Alkaline phosphatase (APISO): 165 U/L — ABNORMAL HIGH (ref 37–153)
BUN/Creatinine Ratio: 6 (calc) (ref 6–22)
BUN: 4 mg/dL — ABNORMAL LOW (ref 7–25)
CO2: 33 mmol/L — ABNORMAL HIGH (ref 20–32)
Calcium: 8.8 mg/dL (ref 8.6–10.4)
Chloride: 95 mmol/L — ABNORMAL LOW (ref 98–110)
Creat: 0.65 mg/dL (ref 0.60–0.88)
Globulin: 2.9 g/dL (calc) (ref 1.9–3.7)
Glucose, Bld: 90 mg/dL (ref 65–99)
Potassium: 4.1 mmol/L (ref 3.5–5.3)
Sodium: 134 mmol/L — ABNORMAL LOW (ref 135–146)
Total Bilirubin: 0.6 mg/dL (ref 0.2–1.2)
Total Protein: 6.1 g/dL (ref 6.1–8.1)

## 2020-01-01 LAB — CBC WITH DIFFERENTIAL/PLATELET
Absolute Monocytes: 485 cells/uL (ref 200–950)
Basophils Absolute: 11 cells/uL (ref 0–200)
Basophils Relative: 0.2 %
Eosinophils Absolute: 11 cells/uL — ABNORMAL LOW (ref 15–500)
Eosinophils Relative: 0.2 %
HCT: 34.5 % — ABNORMAL LOW (ref 35.0–45.0)
Hemoglobin: 11.1 g/dL — ABNORMAL LOW (ref 11.7–15.5)
Lymphs Abs: 1248 cells/uL (ref 850–3900)
MCH: 31.3 pg (ref 27.0–33.0)
MCHC: 32.2 g/dL (ref 32.0–36.0)
MCV: 97.2 fL (ref 80.0–100.0)
MPV: 9.4 fL (ref 7.5–12.5)
Monocytes Relative: 8.5 %
Neutro Abs: 3944 cells/uL (ref 1500–7800)
Neutrophils Relative %: 69.2 %
Platelets: 213 10*3/uL (ref 140–400)
RBC: 3.55 10*6/uL — ABNORMAL LOW (ref 3.80–5.10)
RDW: 11.9 % (ref 11.0–15.0)
Total Lymphocyte: 21.9 %
WBC: 5.7 10*3/uL (ref 3.8–10.8)

## 2020-01-01 MED ORDER — ONDANSETRON 4 MG PO TBDP
4.0000 mg | ORAL_TABLET | Freq: Three times a day (TID) | ORAL | 0 refills | Status: DC | PRN
Start: 2020-01-01 — End: 2020-03-12

## 2020-01-01 MED ORDER — IOPAMIDOL (ISOVUE-300) INJECTION 61%
100.0000 mL | Freq: Once | INTRAVENOUS | Status: AC | PRN
  Administered 2020-01-01: 100 mL via INTRAVENOUS

## 2020-01-01 NOTE — Progress Notes (Signed)
Subjective:    Patient ID: [name], female    DOB: [birth year], 86 y.o.   MRN: [id number]  HPI The patient is here for an acute visit.  She is here with her daughter.    Over the weekend she had severe diarrhea and it was bloody with bright red blood.  She also had nausea.  she denies vomiting.  Yesterday she went a few times, but it was a small amount -  It was not diarrhea.  Still some blood.  She took some pepto bismol and the diarrhea went away.    She has not had a BM today.  She still has some nausea and does not want to eat  She has abdominal soreness and it a little better.     Medications and allergies reviewed with patient and updated if appropriate.  Patient Active Problem List   Diagnosis Date Noted  . Dark stools 11/05/2019  . GERD (gastroesophageal reflux disease) 11/05/2019  . Hypomagnesemia 10/14/2019  . Skin tear of left upper arm without complication 33/82/5053  . COPD with acute exacerbation ([place]) 05/22/2019  . Urinary incontinence due to immobility 05/22/2019  . Non-healing wound of lower extremity 05/03/2019  . Multiple skin tears 03/02/2019  . Weight loss 11/20/2018  . Depression 11/20/2018  . Recurrent falls 11/20/2018  . Parotiditis 09/17/2018  . Primary osteoarthritis of right hip 06/21/2018  . Degenerative disc disease, cervical 12/06/2017  . DOE (dyspnea on exertion) 11/21/2017  . Bilateral swelling of feet 11/02/2017  . Osteopenia 02/16/2017  . Hyperglycemia 12/08/2016  . Degenerative arthritis of right knee 10/27/2016  . 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 Greater trochanteric bursitis of both hips 04/08/2016  . Pain in both knees 04/08/2016  . Chronic midline low back pain without sciatica 04/08/2016  . Chronic respiratory failure with hypoxia ([place]) 03/07/2015  . Diverticulitis 05/24/2014  . Anxiety   . Bladder prolapse, female, acquired   . Non-small cell carcinoma of lung, stage 1 ([place])  01/10/2012  . Neuropathy, peripheral 01/28/2011  . COPD mixed type ([place]) 09/08/2010  . Dyslipidemia   . ATONY OF BLADDER 08/10/2009  . Hypothyroidism 03/12/2008  . Seasonal allergic rhinitis 10/10/2007    Current Outpatient Medications on File Prior to Visit  Medication Sig Dispense Refill  . ALPRAZolam (XANAX) 1 MG tablet TAKE 1 TABLET BY MOUTH EVERYDAY AT BEDTIME 30 tablet 0  . atorvastatin (LIPITOR) 20 MG tablet TAKE 1 TABLET (20 MG TOTAL) BY MOUTH DAILY AT 6 PM. (Patient taking differently: Take 20 mg by mouth See admin instructions. Takes on Mon, Wed, and Fri) 90 tablet 1  . Cholecalciferol (VITAMIN D) 2000 UNITS tablet Take 1 tablet (2,000 Units total) by mouth daily. 30 tablet 11  . FLOVENT HFA 110 MCG/ACT inhaler TAKE 2 PUFFS BY MOUTH TWICE A DAY (Patient taking differently: Inhale 2 puffs into the lungs in the morning and at bedtime. ) 12 g 5  . levalbuterol (XOPENEX) 1.25 MG/3ML nebulizer solution Take 1.25 mg by nebulization every 6 (six) hours as needed for wheezing. DX: Bronchiectasis 180 mL 5  . levothyroxine (SYNTHROID) 88 MCG tablet Take 1 tablet (88 mcg total) by mouth daily. 90 tablet 0  . Multiple Vitamins-Minerals (CENTRUM SILVER PO) Take 1 tablet by mouth daily.     . Probiotic Product (PROBIOTIC PO) Take 1 capsule by mouth daily.    [name] Respiratory Therapy Supplies (FLUTTER) DEVI Blow through 4 times per cycle and repeat 3  cycles per day 1 each 0  . Respiratory Therapy Supplies (NEBULIZER/TUBING/MOUTHPIECE) KIT As directed 3 each 12  . [DISCONTINUED] pantoprazole (PROTONIX) 40 MG tablet Take 1 tablet (40 mg total) by mouth 2 (two) times daily before a meal. (Patient not taking: Reported on 11/26/2019) 180 tablet 3   No current facility-administered medications on file prior to visit.    Past Medical History:  Diagnosis Date  . Allergic rhinitis   . Anxiety   . Arthritis    "back, hands; hips" (12/22/2014)  . Asthma   . Bladder atony   . Chronic bronchitis ([place])   .  COPD (chronic obstructive pulmonary disease) ([place])   . Diarrhea   . Emphysema of lung ([place])   . GERD (gastroesophageal reflux disease)    "w/spicey foods" (12/22/2014)  . Hyperlipidemia   . Non-small cell carcinoma of lung, stage 1 ([place]) 2005   1.4 cm Poorly differentiated Squamous cell RUL  T1N0 resected 09/22/2003  . On home oxygen therapy    "3L q night and prn during the day" (12/22/2014)  . Pelvic cyst   . Pneumonia    "this is the 3rd time that I can remember" (12/22/2014)  . Unspecified hypothyroidism     Past Surgical History:  Procedure Laterality Date  . ABDOMINAL HYSTERECTOMY  ~ 1976  . BACK SURGERY    . CATARACT EXTRACTION W/ INTRAOCULAR LENS  IMPLANT, BILATERAL Bilateral ~ 2012  . DILATION AND CURETTAGE OF UTERUS    . LUMBAR DISC SURGERY  1970's  . LYMPH NODE DISSECTION  2005   "all my lymph nodes under breasts, went thru my back; Dr. Arlyce Dice did it"  . Needle aspiration Pelvic cyst  2011   Dr Diona Fanti  . VIDEO ASSISTED THORACOSCOPY (VATS)/THOROCOTOMY Right 09/2003   thoracotomy, right upper lobectomy with node dissection; Dr Demetrios Loll 11/02/2010  . VIDEO BRONCHOSCOPY  03/2004   Archie Endo 11/02/2010    Social History   Socioeconomic History  . Marital status: Married    Spouse name: Not on file  . Number of children: 2  . Years of education: 25  . Highest education level: High school graduate  Occupational History  . Occupation: Retired Animator, School system    Employer: RETIRED  Tobacco Use  . Smoking status: Former Smoker    Packs/day: 1.00    Years: 42.00    Pack years: 42.00    Types: Cigarettes    Quit date: 09/22/2003    Years since quitting: 16.2  . Smokeless tobacco: Never Used  Vaping Use  . Vaping Use: Never used  Substance and Sexual Activity  . Alcohol use: Yes    Alcohol/week: 14.0 standard drinks    Types: 14 Glasses of wine per week    Comment: 12/22/2014 "I have a large glass of wine qd"  . Drug use: No  . Sexual activity: Never    Other Topics Concern  . Not on file  Social History Narrative   Lives with husband in a one story home.  Has 2 children.  Retired.  Education: high school.    Social Determinants of Health   Financial Resource Strain:   . Difficulty of Paying Living Expenses:   Food Insecurity:   . Worried About Charity fundraiser in the Last Year:   . Arboriculturist in the Last Year:   Transportation Needs:   . Film/video editor (Medical):   [name] Lack of Transportation (Non-Medical):   Physical Activity:   .  Days of Exercise per Week:   . Minutes of Exercise per Session:   Stress:   . Feeling of Stress :   Social Connections:   . Frequency of Communication with Friends and Family:   . Frequency of Social Gatherings with Friends and Family:   . Attends Religious Services:   . Active Member of Clubs or Organizations:   . Attends Archivist Meetings:   [name] Marital Status:     Family History  Problem Relation Age of Onset  . Heart disease Father   . Rheumatologic disease Brother   . Cancer Brother        Leukemia  . Rheumatologic disease Sister   . Breast cancer Daughter     Review of Systems  Constitutional: Positive for appetite change and fatigue. Negative for chills and fever.  Respiratory: Positive for cough, shortness of breath and wheezing.   Cardiovascular: Positive for leg swelling.  Gastrointestinal: Positive for abdominal pain (LLQ - better,still sore), blood in stool, diarrhea and nausea. Negative for rectal pain.  Neurological: Positive for light-headedness. Negative for headaches.       Objective:   Vitals:   01/01/20 1202  BP: 132/72  Pulse: 61  Temp: 97.9 F (36.6 C)  SpO2: 94%   BP Readings from Last 3 Encounters:  01/01/20 132/72  11/27/19 (!) 175/85  11/05/19 (!) 150/78   Wt Readings from Last 3 Encounters:  01/01/20 133 lb (60.3 kg)  11/05/19 128 lb 12.8 oz (58.4 kg)  08/27/19 130 lb (59 kg)   Body mass index is 21.47 kg/m.    Physical Exam Constitutional:      General: She is not in acute distress.    Appearance: Normal appearance. She is not ill-appearing.  HENT:     Head: Normocephalic and atraumatic.  Cardiovascular:     Rate and Rhythm: Normal rate and regular rhythm.  Pulmonary:     Effort: Pulmonary effort is normal. No respiratory distress.     Breath sounds: Wheezing present.  Abdominal:     General: There is no distension.     Palpations: Abdomen is soft. There is no mass.     Tenderness: There is abdominal tenderness (diffuse but worse in LLQ). There is no guarding or rebound.  Skin:    General: Skin is warm and dry.  Neurological:     Mental Status: She is alert.            Assessment & Plan:    See Problem List for Assessment and Plan of chronic medical problems.    This visit occurred during the SARS-CoV-2 public health emergency.  Safety protocols were in place, including screening questions prior to the visit, additional usage of staff PPE, and extensive cleaning of exam room while observing appropriate contact time as indicated for disinfecting solutions.

## 2020-01-01 NOTE — Assessment & Plan Note (Addendum)
Acute LLQ pain - with generalized soreness associated with bloody stool, diarrhea and nausea Concern for diverticulitis, vs diverticulosis or hemorrhoidal bleed Cbc, cmp stat Ct ab/p today She does not tolerate any antibiotics due to nausea - if diverticulitis may need to be in hospital

## 2020-01-01 NOTE — Patient Instructions (Addendum)
  Blood work was ordered.     Medications reviewed and updated.  Changes include :   zofran as needed.  Your prescription(s) have been submitted to your pharmacy. Please take as directed and contact our office if you believe you are having problem(s) with the medication(s).   A referral for GI was ordered.

## 2020-01-01 NOTE — Assessment & Plan Note (Signed)
Acute  associated with LLQ pain - with generalized soreness and nausea Concern for diverticulitis, vs diverticulosis or hemorrhoidal bleed Cbc, cmp stat Ct ab/p today She does not tolerate any antibiotics due to nausea - if diverticulitis may need to be in hospital

## 2020-01-01 NOTE — Assessment & Plan Note (Signed)
Acute  associated with LLQ pain - with generalized soreness, bloody stool and nausea Concern for diverticulitis, vs diverticulosis or hemorrhoidal bleed Cbc, cmp stat Ct ab/p today She does not tolerate any antibiotics due to nausea - if diverticulitis may need to be in hospital

## 2020-01-06 ENCOUNTER — Encounter: Payer: Self-pay | Admitting: Physician Assistant

## 2020-01-06 ENCOUNTER — Telehealth: Payer: Self-pay

## 2020-01-06 NOTE — Telephone Encounter (Signed)
CT scan showed no diverticulitis - just diverticulosis, so no antibiotics needed. Nothing concerning on the Ct. If the bleeding continues or gets worse before she sees GI she needs to let us know. Bleeding may have been from diverticulosis. Blood work did not show anything concerning  (Dr. Quay Burow note from [facility] scan results)

## 2020-01-06 NOTE — Telephone Encounter (Signed)
New message    Calling for test results  

## 2020-01-06 NOTE — Telephone Encounter (Signed)
Called and spoke with daughter and info given.

## 2020-01-08 ENCOUNTER — Emergency Department (HOSPITAL_COMMUNITY): Payer: PPO

## 2020-01-08 ENCOUNTER — Emergency Department (HOSPITAL_COMMUNITY)
Admission: EM | Admit: 2020-01-08 | Discharge: 2020-01-08 | Disposition: A | Discharge status: Home / Self Care | Payer: PPO | Attending: Emergency Medicine | Admitting: Emergency Medicine

## 2020-01-08 ENCOUNTER — Encounter (HOSPITAL_COMMUNITY): Payer: Self-pay

## 2020-01-08 DIAGNOSIS — S0990XA Unspecified injury of head, initial encounter: Secondary | ICD-10-CM | POA: Diagnosis not present

## 2020-01-08 DIAGNOSIS — J441 Chronic obstructive pulmonary disease with (acute) exacerbation: Secondary | ICD-10-CM | POA: Diagnosis not present

## 2020-01-08 DIAGNOSIS — Y92002 Bathroom of unspecified non-institutional (private) residence single-family (private) house as the place of occurrence of the external cause: Secondary | ICD-10-CM | POA: Insufficient documentation

## 2020-01-08 DIAGNOSIS — Z85118 Personal history of other malignant neoplasm of bronchus and lung: Secondary | ICD-10-CM | POA: Insufficient documentation

## 2020-01-08 DIAGNOSIS — R5381 Other malaise: Secondary | ICD-10-CM | POA: Diagnosis not present

## 2020-01-08 DIAGNOSIS — M1711 Unilateral primary osteoarthritis, right knee: Secondary | ICD-10-CM | POA: Diagnosis not present

## 2020-01-08 DIAGNOSIS — S80212A Abrasion, left knee, initial encounter: Secondary | ICD-10-CM | POA: Diagnosis not present

## 2020-01-08 DIAGNOSIS — R296 Repeated falls: Secondary | ICD-10-CM | POA: Diagnosis not present

## 2020-01-08 DIAGNOSIS — S81011A Laceration without foreign body, right knee, initial encounter: Secondary | ICD-10-CM | POA: Diagnosis not present

## 2020-01-08 DIAGNOSIS — R58 Hemorrhage, not elsewhere classified: Secondary | ICD-10-CM | POA: Diagnosis not present

## 2020-01-08 DIAGNOSIS — S6992XA Unspecified injury of left wrist, hand and finger(s), initial encounter: Secondary | ICD-10-CM | POA: Diagnosis not present

## 2020-01-08 DIAGNOSIS — W19XXXA Unspecified fall, initial encounter: Secondary | ICD-10-CM

## 2020-01-08 DIAGNOSIS — E039 Hypothyroidism, unspecified: Secondary | ICD-10-CM | POA: Insufficient documentation

## 2020-01-08 DIAGNOSIS — Z87891 Personal history of nicotine dependence: Secondary | ICD-10-CM | POA: Diagnosis not present

## 2020-01-08 DIAGNOSIS — Y998 Other external cause status: Secondary | ICD-10-CM | POA: Diagnosis not present

## 2020-01-08 DIAGNOSIS — M87851 Other osteonecrosis, right femur: Secondary | ICD-10-CM | POA: Diagnosis not present

## 2020-01-08 DIAGNOSIS — Y9389 Activity, other specified: Secondary | ICD-10-CM | POA: Diagnosis not present

## 2020-01-08 DIAGNOSIS — S41112A Laceration without foreign body of left upper arm, initial encounter: Secondary | ICD-10-CM | POA: Diagnosis not present

## 2020-01-08 DIAGNOSIS — Z7951 Long term (current) use of inhaled steroids: Secondary | ICD-10-CM | POA: Insufficient documentation

## 2020-01-08 DIAGNOSIS — S40812A Abrasion of left upper arm, initial encounter: Secondary | ICD-10-CM | POA: Diagnosis not present

## 2020-01-08 DIAGNOSIS — S80211A Abrasion, right knee, initial encounter: Secondary | ICD-10-CM | POA: Diagnosis not present

## 2020-01-08 DIAGNOSIS — S79911A Unspecified injury of right hip, initial encounter: Secondary | ICD-10-CM | POA: Diagnosis not present

## 2020-01-08 DIAGNOSIS — Z91040 Latex allergy status: Secondary | ICD-10-CM | POA: Diagnosis not present

## 2020-01-08 DIAGNOSIS — M5013 Cervical disc disorder with radiculopathy, cervicothoracic region: Secondary | ICD-10-CM | POA: Diagnosis not present

## 2020-01-08 DIAGNOSIS — W010XXA Fall on same level from slipping, tripping and stumbling without subsequent striking against object, initial encounter: Secondary | ICD-10-CM | POA: Insufficient documentation

## 2020-01-08 DIAGNOSIS — S199XXA Unspecified injury of neck, initial encounter: Secondary | ICD-10-CM | POA: Diagnosis not present

## 2020-01-08 DIAGNOSIS — S8991XA Unspecified injury of right lower leg, initial encounter: Secondary | ICD-10-CM | POA: Diagnosis not present

## 2020-01-08 DIAGNOSIS — T148XXA Other injury of unspecified body region, initial encounter: Secondary | ICD-10-CM

## 2020-01-08 DIAGNOSIS — R52 Pain, unspecified: Secondary | ICD-10-CM | POA: Diagnosis not present

## 2020-01-08 DIAGNOSIS — S59912A Unspecified injury of left forearm, initial encounter: Secondary | ICD-10-CM | POA: Diagnosis not present

## 2020-01-08 DIAGNOSIS — M4723 Other spondylosis with radiculopathy, cervicothoracic region: Secondary | ICD-10-CM | POA: Diagnosis not present

## 2020-01-08 DIAGNOSIS — R9082 White matter disease, unspecified: Secondary | ICD-10-CM | POA: Insufficient documentation

## 2020-01-08 DIAGNOSIS — M50123 Cervical disc disorder at C6-C7 level with radiculopathy: Secondary | ICD-10-CM | POA: Diagnosis not present

## 2020-01-08 DIAGNOSIS — R1111 Vomiting without nausea: Secondary | ICD-10-CM | POA: Diagnosis not present

## 2020-01-08 DIAGNOSIS — M1611 Unilateral primary osteoarthritis, right hip: Secondary | ICD-10-CM | POA: Diagnosis not present

## 2020-01-08 MED ORDER — TETANUS-DIPHTH-ACELL PERTUSSIS 5-2.5-18.5 LF-MCG/0.5 IM SUSP
0.5000 mL | Freq: Once | INTRAMUSCULAR | Status: AC
  Administered 2020-01-08: 0.5 mL via INTRAMUSCULAR
  Filled 2020-01-08: qty 0.5

## 2020-01-08 MED ORDER — BACITRACIN ZINC 500 UNIT/GM EX OINT
TOPICAL_OINTMENT | CUTANEOUS | Status: AC
  Filled 2020-01-08: qty 1.8

## 2020-01-08 MED ORDER — LIDOCAINE-EPINEPHRINE (PF) 2 %-1:200000 IJ SOLN
10.0000 mL | Freq: Once | INTRAMUSCULAR | Status: AC
  Administered 2020-01-08: 10 mL via INTRADERMAL
  Filled 2020-01-08: qty 20

## 2020-01-08 NOTE — Discharge Instructions (Signed)
Gently clean the wound with soap and water. Make sure to pat dry the wound before covering it with any dressing. You can use topical antibiotic ointment and bandage. Ice and elevate for pain relief.   You can take Tylenol or Ibuprofen as directed for pain. You can alternate Tylenol and Ibuprofen every 4 hours for additional pain relief.   As we discussed, Steri-Strips will fall off on their own..   Monitor closely for any signs of infection. Return to the Emergency Department for any worsening redness/swelling of the area that begins to spread, drainage from the site, worsening pain, fever or any other worsening or concerning symptoms.

## 2020-01-08 NOTE — ED Triage Notes (Signed)
She tells [place] she fell at home at about 0300 today upon getting up to use b.s.c. she c/o right knee, left arm and wrist and some neck soreness. She is alert and in no distress. She has a skin tear at her ant. Right knee plus other abrasions at various stages of healing. She is a multiple recent fall patient.

## 2020-01-08 NOTE — ED Provider Notes (Signed)
[facility] DEPT Provider Note   CSN: [id number] Arrival date & time: 01/08/20  1226     History Chief Complaint  Patient presents with  . Fall    [name] is a 86 y.o. female brought in by EMS for evaluation of unwitnessed fall that occurred about 3 AM this morning.  Patient reports that she was getting up in the middle the night to use the bathroom.  She states that she normally uses a walker.  She reports that she lost her footing, causing her to fall.  She is unsure of what exactly she hit but reports pain/abrasions to her left arm, right knee.  She does not think she hit her head or lost consciousness.  She is not on blood thinners.  She reports that her husband helped her up and got her back into bed where she slept about 10 AM this morning.  When she woke up, she called her daughter over who saw the abrasions on her arm and knee and called EMS.  Patient reports she normally ambulates with the assistance of a walker.  She has chronic right hip pain and right knee pain and does report worsening pain after fall.  She did not have any preceding chest pain or dizziness that preempted her fall.  She denies any abdominal pain, nausea/vomiting, numbness/weakness of her arms or legs, difficulty breathing.  The history is provided by the patient.       Past Medical History:  Diagnosis Date  . Allergic rhinitis   . Anxiety   . Arthritis    "back, hands; hips" (12/22/2014)  . Asthma   . Bladder atony   . Chronic bronchitis ([place])   . COPD (chronic obstructive pulmonary disease) ([place])   . Diarrhea   . Emphysema of lung ([place])   . GERD (gastroesophageal reflux disease)    "w/spicey foods" (12/22/2014)  . Hyperlipidemia   . Non-small cell carcinoma of lung, stage 1 ([place]) 2005   1.4 cm Poorly differentiated Squamous cell RUL  T1N0 resected 09/22/2003  . On home oxygen therapy    "3L q night and prn during the day" (12/22/2014)  . Pelvic cyst   . Pneumonia     "this is the 3rd time that I can remember" (12/22/2014)  . Unspecified hypothyroidism     Patient Active Problem List   Diagnosis Date Noted  . Diarrhea 01/01/2020  . Blood in stool 01/01/2020  . Left lower quadrant abdominal pain 01/01/2020  . Dark stools 11/05/2019  . GERD (gastroesophageal reflux disease) 11/05/2019  . Hypomagnesemia 10/14/2019  . Skin tear of left upper arm without complication 17/40/8144  . COPD with acute exacerbation ([place]) 05/22/2019  . Urinary incontinence due to immobility 05/22/2019  . Non-healing wound of lower extremity 05/03/2019  . Multiple skin tears 03/02/2019  . Weight loss 11/20/2018  . Depression 11/20/2018  . Recurrent falls 11/20/2018  . Parotiditis 09/17/2018  . Primary osteoarthritis of right hip 06/21/2018  . Degenerative disc disease, cervical 12/06/2017  . DOE (dyspnea on exertion) 11/21/2017  . Bilateral swelling of feet 11/02/2017  . Osteopenia 02/16/2017  . Hyperglycemia 12/08/2016  . Degenerative arthritis of right knee 10/27/2016  . 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 Greater trochanteric bursitis of both hips 04/08/2016  . Pain in both knees 04/08/2016  . Chronic midline low back pain without sciatica 04/08/2016  . Chronic respiratory failure with hypoxia ([place]) 03/07/2015  . Diverticulitis 05/24/2014  .  Anxiety   . Bladder prolapse, female, acquired   . Non-small cell carcinoma of lung, stage 1 ([place]) 01/10/2012  . Neuropathy, peripheral 01/28/2011  . COPD mixed type ([place]) 09/08/2010  . Dyslipidemia   . ATONY OF BLADDER 08/10/2009  . Hypothyroidism 03/12/2008  . Seasonal allergic rhinitis 10/10/2007    Past Surgical History:  Procedure Laterality Date  . ABDOMINAL HYSTERECTOMY  ~ 1976  . BACK SURGERY    . CATARACT EXTRACTION W/ INTRAOCULAR LENS  IMPLANT, BILATERAL Bilateral ~ 2012  . DILATION AND CURETTAGE OF UTERUS    . LUMBAR DISC SURGERY  1970's  . LYMPH NODE DISSECTION  2005    "all my lymph nodes under breasts, went thru my back; Dr. Arlyce Dice did it"  . Needle aspiration Pelvic cyst  2011   Dr Diona Fanti  . VIDEO ASSISTED THORACOSCOPY (VATS)/THOROCOTOMY Right 09/2003   thoracotomy, right upper lobectomy with node dissection; Dr Demetrios Loll 11/02/2010  . VIDEO BRONCHOSCOPY  03/2004   Archie Endo 11/02/2010     OB History   No obstetric history on file.     Family History  Problem Relation Age of Onset  . Heart disease Father   . Rheumatologic disease Brother   . Cancer Brother        Leukemia  . Rheumatologic disease Sister   . Breast cancer Daughter     Social History   Tobacco Use  . Smoking status: Former Smoker    Packs/day: 1.00    Years: 42.00    Pack years: 42.00    Types: Cigarettes    Quit date: 09/22/2003    Years since quitting: 16.3  . Smokeless tobacco: Never Used  Vaping Use  . Vaping Use: Never used  Substance Use Topics  . Alcohol use: Yes    Alcohol/week: 14.0 standard drinks    Types: 14 Glasses of wine per week    Comment: 12/22/2014 "I have a large glass of wine qd"  . Drug use: No    Home Medications Prior to Admission medications   Medication Sig Start Date End Date Taking? Authorizing Provider  ALPRAZolam Duanne Moron) 1 MG tablet TAKE 1 TABLET BY MOUTH EVERYDAY AT BEDTIME 12/16/19   Burns, Claudina Lick, MD  atorvastatin (LIPITOR) 20 MG tablet TAKE 1 TABLET (20 MG TOTAL) BY MOUTH DAILY AT 6 PM. Patient taking differently: Take 20 mg by mouth See admin instructions. Takes on Mon, Wed, and Fri 05/20/19   Binnie Rail, MD  Cholecalciferol (VITAMIN D) 2000 UNITS tablet Take 1 tablet (2,000 Units total) by mouth daily. 04/29/15   Rowe Clack, MD  FLOVENT HFA 110 MCG/ACT inhaler TAKE 2 PUFFS BY MOUTH TWICE A DAY Patient taking differently: Inhale 2 puffs into the lungs in the morning and at bedtime.  08/06/19   Deneise Lever, MD  levalbuterol Penne Lash) 1.25 MG/3ML nebulizer solution Take 1.25 mg by nebulization every 6 (six) hours  as needed for wheezing. DX: Bronchiectasis 09/27/19   Baird Lyons D, MD  levothyroxine (SYNTHROID) 88 MCG tablet Take 1 tablet (88 mcg total) by mouth daily. 12/25/19   Binnie Rail, MD  Multiple Vitamins-Minerals (CENTRUM SILVER PO) Take 1 tablet by mouth daily.     [provider]  ondansetron (ZOFRAN ODT) 4 MG disintegrating tablet Take 1 tablet (4 mg total) by mouth every 8 (eight) hours as needed for nausea or vomiting. 01/01/20   Binnie Rail, MD  Probiotic Product (PROBIOTIC PO) Take 1 capsule by mouth daily.  [provider]  Respiratory Therapy Supplies (FLUTTER) DEVI Blow through 4 times per cycle and repeat 3 cycles per day 10/15/13   Young, Clinton D, MD  Respiratory Therapy Supplies (NEBULIZER/TUBING/MOUTHPIECE) KIT As directed 05/24/18   Young, Clinton D, MD  pantoprazole (PROTONIX) 40 MG tablet Take 1 tablet (40 mg total) by mouth 2 (two) times daily before a meal. Patient not taking: Reported on 11/26/2019 11/05/19 11/27/19  Burns, Stacy J, MD    Allergies    Ampicillin, Azithromycin, Codeine, Daliresp [roflumilast], Nitrofurantoin, Other, Penicillins, Sulfonamide derivatives, Tiotropium bromide monohydrate, Cephalosporins, Doxycycline, Protonix [pantoprazole], Fludrocortisone acetate, Lactose intolerance (gi), Latex, and Prednisone  Review of Systems   Review of Systems  Constitutional: Negative for fever.  Respiratory: Negative for cough and shortness of breath.   Cardiovascular: Negative for chest pain.  Gastrointestinal: Negative for abdominal pain, nausea and vomiting.  Genitourinary: Negative for dysuria and hematuria.  Musculoskeletal:       LUE pain Right knee pain Right hip pain  Skin: Positive for wound.  Neurological: Negative for weakness, numbness and headaches.  All other systems reviewed and are negative.   Physical Exam Updated Vital Signs BP (!) 177/102 (BP Location: Right Arm)   Pulse (!) 111   Temp 99.8 F (37.7 C) (Oral)   Resp 18    SpO2 99%   Physical Exam Vitals and nursing note reviewed.  Constitutional:      Appearance: Normal appearance. She is well-developed.  HENT:     Head: Normocephalic and atraumatic.     Comments: No tenderness to palpation of skull. No deformities or crepitus noted. No open wounds, abrasions or lacerations.  Eyes:     General: Lids are normal.     Conjunctiva/sclera: Conjunctivae normal.     Pupils: Pupils are equal, round, and reactive to light.     Comments: PERRL. EOMs intact. No nystagmus. No neglect.   Neck:      Comments: Full flexion/extension and lateral movement of neck fully intact. No bony midline tenderness. No deformities or crepitus.  Cardiovascular:     Rate and Rhythm: Normal rate and regular rhythm.     Pulses: Normal pulses.     Heart sounds: Normal heart sounds. No murmur heard.  No friction rub. No gallop.   Pulmonary:     Effort: Pulmonary effort is normal.     Breath sounds: Wheezing present.     Comments: Mild wheezing noted throughout the lung bases.  No evidence of respiratory stress.  Able speak in full sentences without any difficulty. Abdominal:     Palpations: Abdomen is soft. Abdomen is not rigid.     Tenderness: There is no abdominal tenderness. There is no guarding.     Comments: Abdomen is soft, non-distended, non-tender. No rigidity, No guarding. No peritoneal signs.  Musculoskeletal:        General: Normal range of motion.     Cervical back: Full passive range of motion without pain.     Comments: Tenderness palpation of the anterior aspect of the left wrist and left forearm with overlying skin avulsion noted to the dorsal aspect of the distal forearm.  No deformity or crepitus noted.  Flexion/tension of left wrist intact without difficulty.  She can flex and extend all 5 digits.  No bony tenderness noted to left elbow, left shoulder.  No bony tenderness noted to right upper extremity.  Abrasion noted overlying the posterior aspect of the right  elbow.  Tenderness palpation in anterior aspect of the   right knee with overlying laceration.  Flexion/extension intact.  No bony tenderness of the right tib-fib, right ankle.  Tenderness palpation noted to right hip.  Patient does have chronic pain in the hip but seems to be worsening.  Flexion/tension right lower extremity intact.  No pelvic instability.  No tenderness palpation of the left hip, left tib-fib, left knee, femur.  Skin:    General: Skin is warm and dry.     Capillary Refill: Capillary refill takes less than 2 seconds.     Comments: Scattered abrasions noted to left upper extremity.  Neurological:     Mental Status: She is alert and oriented to person, place, and time.     Comments: Follows commands, Moves all extremities  5/5 strength to BUE and BLE  Sensation intact throughout all major nerve distributions  Psychiatric:        Speech: Speech normal.     ED Results / Procedures / Treatments   Labs (all labs ordered are listed, but only abnormal results are displayed) Labs Reviewed - No data to display  EKG None  Radiology DG Forearm Left  Result Date: 01/08/2020 CLINICAL DATA:  Fall, pain EXAM: LEFT WRIST - COMPLETE 3+ VIEW; LEFT FOREARM - 2 VIEW COMPARISON:  None. FINDINGS: No displaced fracture or dislocation of the left radius or ulna or left wrist. Probable growth arrest lines of the distal radius and ulna. Osteopenia. Carpal arthrosis. Soft tissues are unremarkable. IMPRESSION: 1. No displaced fracture or dislocation of the left radius or ulna or left wrist. 2.  Osteopenia. Electronically Signed   By: Alex  Bibbey M.D.   On: 01/08/2020 14:03   DG Wrist Complete Left  Result Date: 01/08/2020 CLINICAL DATA:  Fall, pain EXAM: LEFT WRIST - COMPLETE 3+ VIEW; LEFT FOREARM - 2 VIEW COMPARISON:  None. FINDINGS: No displaced fracture or dislocation of the left radius or ulna or left wrist. Probable growth arrest lines of the distal radius and ulna. Osteopenia. Carpal  arthrosis. Soft tissues are unremarkable. IMPRESSION: 1. No displaced fracture or dislocation of the left radius or ulna or left wrist. 2.  Osteopenia. Electronically Signed   By: Alex  Bibbey M.D.   On: 01/08/2020 14:03   CT Head Wo Contrast  Result Date: 01/08/2020 CLINICAL DATA:  Head trauma, fall EXAM: CT HEAD WITHOUT CONTRAST TECHNIQUE: Contiguous axial images were obtained from the base of the skull through the vertex without intravenous contrast. COMPARISON:  None. FINDINGS: Brain: No evidence of acute infarction, hemorrhage, hydrocephalus, extra-axial collection or mass lesion/mass effect. Periventricular white matter hypodensity. Vascular: No hyperdense vessel or unexpected calcification. Skull: Normal. Negative for fracture or focal lesion. Sinuses/Orbits: No acute finding. Other: None. IMPRESSION: No acute intracranial pathology. Small-vessel white matter disease. Electronically Signed   By: Alex  Bibbey M.D.   On: 01/08/2020 14:36   CT Cervical Spine Wo Contrast  Result Date: 01/08/2020 CLINICAL DATA:  Status post fall in home.  Neck pain. EXAM: CT CERVICAL SPINE WITHOUT CONTRAST TECHNIQUE: Multidetector CT imaging of the cervical spine was performed without intravenous contrast. Multiplanar CT image reconstructions were also generated. COMPARISON:  None. FINDINGS: Alignment: 2 mm anterolisthesis of C4 on C5 secondary to facet disease. Skull base and vertebrae: No acute fracture. No primary bone lesion or focal pathologic process. Soft tissues and spinal canal: No prevertebral fluid or swelling. No visible canal hematoma. Disc levels: Degenerative disease with disc height loss at C5-6 and C6-7 and to lesser extent C7-T1. Degenerative disease with severe disc height loss T1-2.   At C2-3 there is moderate left and mild right facet arthropathy. At C3-4 there is moderate left and mild right facet arthropathy. At C4-5 there is a mild broad-based disc bulge and moderate bilateral facet arthropathy.  Mild-moderate bilateral foraminal stenosis at C4-5. At C5-6 there is a broad-based disc osteophyte complex with a central disc protrusion, bilateral uncovertebral degenerative changes, bilateral foraminal narrowing and bilateral facet arthropathy. At C6-7 there is a broad-based disc osteophyte complex with bilateral mild facet arthropathy and mild right foraminal stenosis. At C7-T1 there is moderate bilateral facet arthropathy and mild bilateral foraminal stenosis. At T1-2 there is bilateral facet arthropathy, bilateral uncovertebral degenerative changes and bilateral foraminal stenosis. Upper chest: Lung apices are clear.  Biapical emphysematous changes. Other: Bilateral carotid artery atherosclerosis. IMPRESSION: 1. No acute osseous injury of the cervical spine. 2. Cervical spine spondylosis as described above. Electronically Signed   By: Kathreen Devoid   On: 01/08/2020 15:53   DG Knee Complete 4 Views Right  Result Date: 01/08/2020 CLINICAL DATA:  Left knee pain after fall today. EXAM: RIGHT KNEE - COMPLETE 4+ VIEW COMPARISON:  May 20, 2019. FINDINGS: No evidence of fracture, dislocation, or joint effusion. Moderate narrowing of medial joint space is noted. Soft tissues are unremarkable. IMPRESSION: Moderate degenerative joint disease is noted medially. No acute abnormality seen in the right knee. Electronically Signed   By: Marijo Conception M.D.   On: 01/08/2020 14:01   DG Hip Unilat W or Wo Pelvis 2-3 Views Right  Result Date: 01/08/2020 CLINICAL DATA:  Right hip pain after fall today. EXAM: DG HIP (WITH OR WITHOUT PELVIS) 2-3V RIGHT COMPARISON:  May 20, 2019. FINDINGS: No acute fracture is noted. However, there is significant resorption and deformity of the right femoral head potentially due to avascular necrosis. This results in severe degenerative joint disease involving the right hip. Left hip is unremarkable. IMPRESSION: Severe degenerative joint disease of the right hip is noted,  potentially as a sequela of avascular necrosis. No acute fracture is noted. Electronically Signed   By: Marijo Conception M.D.   On: 01/08/2020 14:05    Procedures .Marland KitchenLaceration Repair  Date/Time: 01/08/2020 4:10 PM Performed by: Volanda Napoleon, PA-C Authorized by: Volanda Napoleon, PA-C   Consent:    Consent obtained:  Verbal   Consent given by:  Patient   Risks discussed:  Infection, need for additional repair, pain, poor cosmetic result and poor wound healing   Alternatives discussed:  No treatment and delayed treatment Universal protocol:    Procedure explained and questions answered to patient or proxy's satisfaction: yes     Relevant documents present and verified: yes     Test results available and properly labeled: yes     Imaging studies available: yes     Required blood products, implants, devices, and special equipment available: yes     Site/side marked: yes     Immediately prior to procedure, a time out was called: yes     Patient identity confirmed:  Verbally with patient Anesthesia (see MAR for exact dosages):    Anesthesia method:  None Laceration details:    Location:  Leg   Leg location:  R knee   Length (cm):  3 Repair type:    Repair type:  Simple Exploration:    Hemostasis achieved with:  Direct pressure   Wound exploration: wound explored through full range of motion   Treatment:    Area cleansed with:  Shur-Clens   Amount of cleaning:  Standard   Irrigation solution:  Sterile saline   Irrigation method:  Syringe   Visualized foreign bodies/material removed: no   Skin repair:    Repair method:  Steri-Strips   Number of Steri-Strips:  3 Approximation:    Approximation:  Close Post-procedure details:    Dressing:  Antibiotic ointment, non-adherent dressing and sterile dressing   Patient tolerance of procedure:  Tolerated well, no immediate complications Comments:     Exploration of the wound showed no evidence of joint involvement.  The wound was  explored through flexion/extension and did not show any evidence of foreign body, tendon injury, joint abnormality.  The skin was very thin and would not sustain a suture so the decision was made to Steri-Strip the area. ..Laceration Repair  Date/Time: 01/08/2020 4:11 PM Performed by: ,  A, PA-C Authorized by: ,  A, PA-C   Consent:    Consent obtained:  Verbal   Consent given by:  Patient   Risks discussed:  Infection, need for additional repair, pain, poor cosmetic result and poor wound healing   Alternatives discussed:  No treatment and delayed treatment Universal protocol:    Procedure explained and questions answered to patient or proxy's satisfaction: yes     Relevant documents present and verified: yes     Test results available and properly labeled: yes     Imaging studies available: yes     Required blood products, implants, devices, and special equipment available: yes     Site/side marked: yes     Immediately prior to procedure, a time out was called: yes     Patient identity confirmed:  Verbally with patient Anesthesia (see MAR for exact dosages):    Anesthesia method:  None Laceration details:    Location:  Shoulder/arm   Shoulder/arm location:  L upper arm   Length (cm):  5 Repair type:    Repair type:  Simple Exploration:    Wound exploration: wound explored through full range of motion   Treatment:    Area cleansed with:  Shur-Clens and saline   Amount of cleaning:  Standard   Irrigation solution:  Sterile saline   Irrigation method:  Syringe   Visualized foreign bodies/material removed: no   Post-procedure details:    Dressing:  Non-adherent dressing and sterile dressing (Vasoline Gauze)   Patient tolerance of procedure:  Tolerated well, no immediate complications Comments:     Evaluation of the injury to her left arm, and appeared to be more of a skin avulsion was not amenable to stitches given the thin nature of the skin tear that  was more distal.  The area was thoroughly extensively cleaned.  Did not extend deep into the tissue.  The area was covered with Xeroform gauze, nonadherent dressing and bacitracin.   (including critical care time)  Medications Ordered in ED Medications  bacitracin 500 UNIT/GM ointment (has no administration in time range)  Tdap (BOOSTRIX) injection 0.5 mL (0.5 mLs Intramuscular Given 01/08/20 1400)  lidocaine-EPINEPHrine (XYLOCAINE W/EPI) 2 %-1:200000 (PF) injection 10 mL (10 mLs Intradermal Given 01/08/20 1401)    ED Course  I have reviewed the triage vital signs and the nursing notes.  Pertinent labs & imaging results that were available during my care of the patient were reviewed by me and considered in my medical decision making (see chart for details).    MDM Rules/Calculators/A&P                            86-year-old female brought in by EMS for evaluation of mechanical fall that occurred earlier this morning at 3 AM.  Does not think she hit her head or lost consciousness.  Is not on blood thinners.  Was able to get up with the assistance of her husband and walk back to bed where she slept till 10 AM.  She woke up and daughter called EMS.  On initially arrival, patient is afebrile, nontoxic-appearing.. Vitals are stable.  She has a skin avulsion noted to the left wrist and left forearm with some tenderness.  She also has scattered abrasions noted to the right upper extremity and left upper extremity.  She is a laceration noted anterior aspect of her right knee.  Plan for imaging of her arm, knee, hip as well as her head and neck.  Plan for wound care.  Patient unsure when her last tetanus shot was. Tdap ordered.   CT head and neck show no acute intracranial normality or any acute bony abnormality of the cervical spine.  Hip x-ray shows severe degenerative joint disease of the right hip noted.  No acute fracture dislocation.  No acute fracture dislocation on the x-ray.  X-ray of left wrist  and forearm is negative for any acute bony abnormality.  Her wounds were repaired as documented above.  She had several abrasions noted to the left and right upper extremity that were cleaned and bandaged.  Additionally, she had a laceration/skin tear noted to the anterior aspect of the right knee that was repaired using Steri-Strips.  Additionally, she had a skin tear/skin avulsion to the left forearm that was repaired with Xeroform gauze, nonadherent dressing.  Patient able to transfer from bed to wheelchair in order to the bathroom.  She was able to ambulate at her baseline with assistance.  At this time, do not feel she needs any further imaging for occult fracture.  Encouraged at home supportive care measures. At this time, patient exhibits no emergent life-threatening condition that require further evaluation in ED or admission. Discussed patient with Dr. Delo who evaluated patient in the ED. Patient had ample opportunity for questions and discussion. All patient's questions were answered with full understanding. Strict return precautions discussed. Patient expresses understanding and agreement to plan.   Portions of this note were generated with Dragon dictation software. Dictation errors may occur despite best attempts at proofreading.   Final Clinical Impression(s) / ED Diagnoses Final diagnoses:  Fall, initial encounter  Abrasion    Rx / DC Orders ED Discharge Orders    None       ,  A, PA-C 01/08/20 1907    Delo, Douglas, MD 01/10/20 0737  

## 2020-01-08 NOTE — ED Notes (Signed)
Placed pure wick. Set suction at 85 mmHg.

## 2020-01-10 ENCOUNTER — Telehealth: Payer: Self-pay | Admitting: Internal Medicine

## 2020-01-10 NOTE — Telephone Encounter (Signed)
Office notes faxed to [facility] attention.   Will close encounter.

## 2020-01-12 ENCOUNTER — Other Ambulatory Visit: Payer: Self-pay | Admitting: Internal Medicine

## 2020-01-14 NOTE — Progress Notes (Signed)
Virtual Visit via Video Note  I connected with [name] on 01/15/20 at 11:15 AM EDT by a video enabled telemedicine application and verified that I am speaking with the correct person using two identifiers.   I discussed the limitations of evaluation and management by telemedicine and the availability of in person appointments. The patient expressed understanding and agreed to proceed.  Present for the visit:  Myself, Dr Billey Gosling, [name] and her daughter [name].  The patient is currently at home and I am in the office.    No referring provider.    History of Present Illness: She is here for an acute visit.    She fell and went to the ED on 7/21. She lost her footing and fell on the way to the bathroom at 3 am.  She was taken to the ED the next morning.  Imaging showed no acute injury including a Ct of her head and neck.  She had lacerations repaired and received a tdap .   She has two spots on her posterior arms that are abrasions that are covered with band-aids.    The worse is the left lower arm.  It has some granulation tissue.  Her daughter washed with the wound wash.  It started burning in the proximal wound.  There is no skin to seal it and her daughter is most concerned about it.  The other wound is on her right knee - it looks better.  There is a lot of bruising.  It is starting to scab over.  She is dressing the wounds daily.    She denies fevers.  There is no evidence of an infection in her wounds at this time, but her and her daughter are nervous about the possibility of an infection.  She has had wound care nursing come to her house in the past and wonder if that would be a good idea.  Her daughter is changing her bandages daily and has done this in the past and knows how to do it and what to use.  They do have all the supplies.  She has continued to have nausea and some some regurgitation.  She did keep her food down this morning.   Social History    Socioeconomic History  . Marital status: Married    Spouse name: Not on file  . Number of children: 2  . Years of education: 49  . Highest education level: High school graduate  Occupational History  . Occupation: Retired Animator, School system    Employer: RETIRED  Tobacco Use  . Smoking status: Former Smoker    Packs/day: 1.00    Years: 42.00    Pack years: 42.00    Types: Cigarettes    Quit date: 09/22/2003    Years since quitting: 16.3  . Smokeless tobacco: Never Used  Vaping Use  . Vaping Use: Never used  Substance and Sexual Activity  . Alcohol use: Yes    Alcohol/week: 14.0 standard drinks    Types: 14 Glasses of wine per week    Comment: 12/22/2014 "I have a large glass of wine qd"  . Drug use: No  . Sexual activity: Never  Other Topics Concern  . Not on file  Social History Narrative   Lives with husband in a one story home.  Has 2 children.  Retired.  Education: high school.    Social Determinants of Health   Financial Resource Strain:   . Difficulty of Paying Living  Expenses:   Food Insecurity:   . Worried About Charity fundraiser in the Last Year:   . Arboriculturist in the Last Year:   Transportation Needs:   . Film/video editor (Medical):   [name] Lack of Transportation (Non-Medical):   Physical Activity:   . Days of Exercise per Week:   . Minutes of Exercise per Session:   Stress:   . Feeling of Stress :   Social Connections:   . Frequency of Communication with Friends and Family:   . Frequency of Social Gatherings with Friends and Family:   . Attends Religious Services:   . Active Member of Clubs or Organizations:   . Attends Archivist Meetings:   [name] Marital Status:      Observations/Objective: Appears well in NAD Wearing her nasal cannula Her daughter did show me the wound on her left distal posterior forearm and right knee-video quality was poor and it was difficult to see it well Left first toe swollen and with  bruising   Assessment and Plan:  See Problem List for Assessment and Plan of chronic medical problems.   Follow Up Instructions:    I discussed the assessment and treatment plan with the patient. The patient was provided an opportunity to ask questions and all were answered. The patient agreed with the plan and demonstrated an understanding of the instructions.   The patient was advised to call back or seek an in-person evaluation if the symptoms worsen or if the condition fails to improve as anticipated.    Binnie Rail, MD

## 2020-01-15 ENCOUNTER — Encounter: Payer: Self-pay | Admitting: Internal Medicine

## 2020-01-15 ENCOUNTER — Telehealth (INDEPENDENT_AMBULATORY_CARE_PROVIDER_SITE_OTHER): Payer: PPO | Admitting: Internal Medicine

## 2020-01-15 DIAGNOSIS — S41102A Unspecified open wound of left upper arm, initial encounter: Secondary | ICD-10-CM

## 2020-01-15 DIAGNOSIS — S81001A Unspecified open wound, right knee, initial encounter: Secondary | ICD-10-CM | POA: Diagnosis not present

## 2020-01-15 MED ORDER — ALPRAZOLAM 1 MG PO TABS: ORAL_TABLET | ORAL | 5 refills | Status: DC

## 2020-01-15 NOTE — Assessment & Plan Note (Signed)
Acute Related to fall 7/21 She did go to the ED and imaging showed no fracture This is a large skin tear-difficult to see on video Will continue home wound care Will request home nurse to help with wound care and to monitor wounds for infection

## 2020-01-15 NOTE — Assessment & Plan Note (Signed)
Acute Related to fall 7/21 She did go to the ED and imaging showed no fracture The wound is difficult to see on video Her daughter will continue home wound care Will request home nurse to help with wound care and to monitor wounds for infection No evidence of active infection so no antibiotics needed at this time Did receive a Tdap in the ED

## 2020-01-21 ENCOUNTER — Encounter: Payer: Self-pay | Admitting: Internal Medicine

## 2020-01-21 ENCOUNTER — Other Ambulatory Visit: Payer: Self-pay | Admitting: Internal Medicine

## 2020-01-21 NOTE — Telephone Encounter (Signed)
No note needed/ error

## 2020-01-25 DIAGNOSIS — S51812D Laceration without foreign body of left forearm, subsequent encounter: Secondary | ICD-10-CM | POA: Diagnosis not present

## 2020-01-25 DIAGNOSIS — S81001D Unspecified open wound, right knee, subsequent encounter: Secondary | ICD-10-CM | POA: Diagnosis not present

## 2020-01-25 DIAGNOSIS — Z9981 Dependence on supplemental oxygen: Secondary | ICD-10-CM | POA: Diagnosis not present

## 2020-01-25 DIAGNOSIS — E079 Disorder of thyroid, unspecified: Secondary | ICD-10-CM | POA: Diagnosis not present

## 2020-01-25 DIAGNOSIS — M1711 Unilateral primary osteoarthritis, right knee: Secondary | ICD-10-CM | POA: Diagnosis not present

## 2020-01-25 DIAGNOSIS — M1611 Unilateral primary osteoarthritis, right hip: Secondary | ICD-10-CM | POA: Diagnosis not present

## 2020-01-25 DIAGNOSIS — Z87891 Personal history of nicotine dependence: Secondary | ICD-10-CM | POA: Diagnosis not present

## 2020-01-25 DIAGNOSIS — M5030 Other cervical disc degeneration, unspecified cervical region: Secondary | ICD-10-CM | POA: Diagnosis not present

## 2020-01-25 DIAGNOSIS — M5186 Other intervertebral disc disorders, lumbar region: Secondary | ICD-10-CM | POA: Diagnosis not present

## 2020-01-25 DIAGNOSIS — F419 Anxiety disorder, unspecified: Secondary | ICD-10-CM | POA: Diagnosis not present

## 2020-01-25 DIAGNOSIS — F329 Major depressive disorder, single episode, unspecified: Secondary | ICD-10-CM | POA: Diagnosis not present

## 2020-01-25 DIAGNOSIS — Z9181 History of falling: Secondary | ICD-10-CM | POA: Diagnosis not present

## 2020-01-25 DIAGNOSIS — J449 Chronic obstructive pulmonary disease, unspecified: Secondary | ICD-10-CM | POA: Diagnosis not present

## 2020-01-27 ENCOUNTER — Telehealth: Payer: Self-pay | Admitting: Internal Medicine

## 2020-01-27 NOTE — Telephone Encounter (Signed)
New message:   Tonya from [facility] is calling and states they are needing orders for nurse visits to treat the pt's left forearm. She states they are needing 1x for 1week, 2x for 1 week, and 1x for 7 wks and 1 prn visit. She states the daughter is teachable and will be doing the other dressing change on the other days. Kenney Houseman also states the pt needs to go to the wound center and she needs and antibiotic. Please advise   Kenney Houseman is aware that Dr. Quay Burow is out of the office this week.

## 2020-01-27 NOTE — Telephone Encounter (Signed)
Called and left message with verbals today.

## 2020-02-05 ENCOUNTER — Telehealth: Payer: Self-pay | Admitting: Internal Medicine

## 2020-02-05 DIAGNOSIS — S51812D Laceration without foreign body of left forearm, subsequent encounter: Secondary | ICD-10-CM | POA: Diagnosis not present

## 2020-02-05 DIAGNOSIS — F419 Anxiety disorder, unspecified: Secondary | ICD-10-CM | POA: Diagnosis not present

## 2020-02-05 DIAGNOSIS — M1611 Unilateral primary osteoarthritis, right hip: Secondary | ICD-10-CM | POA: Diagnosis not present

## 2020-02-05 DIAGNOSIS — Z87891 Personal history of nicotine dependence: Secondary | ICD-10-CM | POA: Diagnosis not present

## 2020-02-05 DIAGNOSIS — F329 Major depressive disorder, single episode, unspecified: Secondary | ICD-10-CM | POA: Diagnosis not present

## 2020-02-05 DIAGNOSIS — M5030 Other cervical disc degeneration, unspecified cervical region: Secondary | ICD-10-CM | POA: Diagnosis not present

## 2020-02-05 DIAGNOSIS — M5186 Other intervertebral disc disorders, lumbar region: Secondary | ICD-10-CM | POA: Diagnosis not present

## 2020-02-05 DIAGNOSIS — M1711 Unilateral primary osteoarthritis, right knee: Secondary | ICD-10-CM | POA: Diagnosis not present

## 2020-02-05 DIAGNOSIS — E079 Disorder of thyroid, unspecified: Secondary | ICD-10-CM | POA: Diagnosis not present

## 2020-02-05 DIAGNOSIS — Z9181 History of falling: Secondary | ICD-10-CM | POA: Diagnosis not present

## 2020-02-05 DIAGNOSIS — Z9981 Dependence on supplemental oxygen: Secondary | ICD-10-CM | POA: Diagnosis not present

## 2020-02-05 DIAGNOSIS — J449 Chronic obstructive pulmonary disease, unspecified: Secondary | ICD-10-CM | POA: Diagnosis not present

## 2020-02-05 DIAGNOSIS — S81001D Unspecified open wound, right knee, subsequent encounter: Secondary | ICD-10-CM | POA: Diagnosis not present

## 2020-02-05 NOTE — Telephone Encounter (Signed)
Ok for verbals 

## 2020-02-05 NOTE — Telephone Encounter (Signed)
Spoke with Moshe Salisbury and verbals given.

## 2020-02-05 NOTE — Telephone Encounter (Signed)
New Message:   [name] is calling and states she needs orders for a PT evaluation and one care orders to treat new skin tears since the pt fell again on Saturday. [name] states it is okay to leave a voicemail on phone. Please advise.

## 2020-02-07 ENCOUNTER — Telehealth: Payer: Self-pay | Admitting: Internal Medicine

## 2020-02-07 DIAGNOSIS — S61502D Unspecified open wound of left wrist, subsequent encounter: Secondary | ICD-10-CM

## 2020-02-07 NOTE — Telephone Encounter (Signed)
    Cara from  [facility] calling to request referral to the wound center. Today she cleaned wound on left wrist, but feels wound care is needed; due to drainage  Also reporting patient  has coughing and wheezing, Cara advised patient to use inhaler and to follow up if symptoms continue  Cara-Phone [phone]

## 2020-02-09 ENCOUNTER — Emergency Department (HOSPITAL_COMMUNITY)
Admission: EM | Admit: 2020-02-09 | Discharge: 2020-02-09 | Disposition: A | Discharge status: Home / Self Care | Payer: PPO | Attending: Emergency Medicine | Admitting: Emergency Medicine

## 2020-02-09 ENCOUNTER — Other Ambulatory Visit: Payer: Self-pay

## 2020-02-09 ENCOUNTER — Encounter (HOSPITAL_COMMUNITY): Payer: Self-pay | Admitting: Emergency Medicine

## 2020-02-09 DIAGNOSIS — E039 Hypothyroidism, unspecified: Secondary | ICD-10-CM | POA: Diagnosis not present

## 2020-02-09 DIAGNOSIS — Z85118 Personal history of other malignant neoplasm of bronchus and lung: Secondary | ICD-10-CM | POA: Insufficient documentation

## 2020-02-09 DIAGNOSIS — Z79899 Other long term (current) drug therapy: Secondary | ICD-10-CM | POA: Insufficient documentation

## 2020-02-09 DIAGNOSIS — Z87891 Personal history of nicotine dependence: Secondary | ICD-10-CM | POA: Insufficient documentation

## 2020-02-09 DIAGNOSIS — J449 Chronic obstructive pulmonary disease, unspecified: Secondary | ICD-10-CM | POA: Diagnosis not present

## 2020-02-09 DIAGNOSIS — L03114 Cellulitis of left upper limb: Secondary | ICD-10-CM | POA: Diagnosis not present

## 2020-02-09 DIAGNOSIS — J45909 Unspecified asthma, uncomplicated: Secondary | ICD-10-CM | POA: Diagnosis not present

## 2020-02-09 DIAGNOSIS — Z91040 Latex allergy status: Secondary | ICD-10-CM | POA: Diagnosis not present

## 2020-02-09 DIAGNOSIS — M79642 Pain in left hand: Secondary | ICD-10-CM | POA: Diagnosis present

## 2020-02-09 MED ORDER — LIDOCAINE HCL (PF) 1 % IJ SOLN
INTRAMUSCULAR | Status: AC
  Administered 2020-02-09: 2.1 mL
  Filled 2020-02-09: qty 30

## 2020-02-09 MED ORDER — CEFTRIAXONE SODIUM 1 G IJ SOLR
1.0000 g | Freq: Once | INTRAMUSCULAR | Status: AC
  Administered 2020-02-09: 1 g via INTRAMUSCULAR
  Filled 2020-02-09: qty 10

## 2020-02-09 MED ORDER — CEPHALEXIN 500 MG PO CAPS
500.0000 mg | ORAL_CAPSULE | Freq: Three times a day (TID) | ORAL | 0 refills | Status: DC
Start: 2020-02-09 — End: 2020-03-11

## 2020-02-09 MED ORDER — ONDANSETRON HCL 4 MG PO TABS
4.0000 mg | ORAL_TABLET | Freq: Three times a day (TID) | ORAL | 0 refills | Status: DC | PRN
Start: 2020-02-09 — End: 2020-03-26

## 2020-02-09 NOTE — ED Notes (Signed)
Per registration, pt in lobby restroom

## 2020-02-09 NOTE — Telephone Encounter (Signed)
Referral ordered

## 2020-02-09 NOTE — ED Triage Notes (Signed)
Pt reports has infection on left arm from a fall over week ago. Pain is really bad with weight bearing. Has bandages from home health. Denies being on antibiotics.

## 2020-02-09 NOTE — ED Notes (Signed)
MD reports he will apply dressing to knee. Awaiting dressing application prior to discharge.

## 2020-02-09 NOTE — Discharge Instructions (Signed)
Use wet-to-dry or even simple gauze dressings on your arm wounds. Clean gently with mild soap/water daily. Take antibiotics until finished. If symptoms to not begin to improve within 48 hours then I want you re-checked.

## 2020-02-11 ENCOUNTER — Telehealth: Payer: Self-pay

## 2020-02-11 ENCOUNTER — Other Ambulatory Visit: Payer: Self-pay | Admitting: Internal Medicine

## 2020-02-11 NOTE — Telephone Encounter (Signed)
New message    Need a call back from the [facility] recent ER visit on Sunday night.

## 2020-02-12 ENCOUNTER — Ambulatory Visit: Payer: PPO | Admitting: Physician Assistant

## 2020-02-12 NOTE — ED Provider Notes (Signed)
[facility] DEPT Provider Note   CSN: [id number] Arrival date & time: 02/09/20  [id number]     History Chief Complaint  Patient presents with  . arm infection    [name] is a 86 y.o. female.  HPI   86 year old female presenting for evaluation of multiple wounds.  Skin tears to left upper extremity recently.  Being managed by home nursing.  Increasing pain and redness over the wound over the dorsal left hand/wrist.  No fevers or chills.  Past Medical History:  Diagnosis Date  . Allergic rhinitis   . Anxiety   . Arthritis    "back, hands; hips" (12/22/2014)  . Asthma   . Bladder atony   . Chronic bronchitis ([place])   . COPD (chronic obstructive pulmonary disease) ([place])   . Diarrhea   . Emphysema of lung ([place])   . GERD (gastroesophageal reflux disease)    "w/spicey foods" (12/22/2014)  . Hyperlipidemia   . Non-small cell carcinoma of lung, stage 1 ([place]) 2005   1.4 cm Poorly differentiated Squamous cell RUL  T1N0 resected 09/22/2003  . On home oxygen therapy    "3L q night and prn during the day" (12/22/2014)  . Pelvic cyst   . Pneumonia    "this is the 3rd time that I can remember" (12/22/2014)  . Unspecified hypothyroidism     Patient Active Problem List   Diagnosis Date Noted  . Arm wound, left, initial encounter 01/15/2020  . Open knee wound, right, initial encounter 01/15/2020  . Diarrhea 01/01/2020  . Blood in stool 01/01/2020  . Left lower quadrant abdominal pain 01/01/2020  . Dark stools 11/05/2019  . GERD (gastroesophageal reflux disease) 11/05/2019  . Hypomagnesemia 10/14/2019  . Skin tear of left upper arm without complication 09/32/6712  . COPD with acute exacerbation ([place]) 05/22/2019  . Urinary incontinence due to immobility 05/22/2019  . Non-healing wound of lower extremity 05/03/2019  . Multiple skin tears 03/02/2019  . Weight loss 11/20/2018  . Depression 11/20/2018  . Recurrent falls 11/20/2018  . Parotiditis 09/17/2018   . Primary osteoarthritis of right hip 06/21/2018  . Degenerative disc disease, cervical 12/06/2017  . DOE (dyspnea on exertion) 11/21/2017  . Bilateral swelling of feet 11/02/2017  . Osteopenia 02/16/2017  . Hyperglycemia 12/08/2016  . Degenerative arthritis of right knee 10/27/2016  . 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 Greater trochanteric bursitis of both hips 04/08/2016  . Pain in both knees 04/08/2016  . Chronic midline low back pain without sciatica 04/08/2016  . Chronic respiratory failure with hypoxia ([place]) 03/07/2015  . Diverticulitis 05/24/2014  . Anxiety   . Bladder prolapse, female, acquired   . Non-small cell carcinoma of lung, stage 1 ([place]) 01/10/2012  . Neuropathy, peripheral 01/28/2011  . COPD mixed type ([place]) 09/08/2010  . Dyslipidemia   . ATONY OF BLADDER 08/10/2009  . Hypothyroidism 03/12/2008  . Seasonal allergic rhinitis 10/10/2007    Past Surgical History:  Procedure Laterality Date  . ABDOMINAL HYSTERECTOMY  ~ 1976  . BACK SURGERY    . CATARACT EXTRACTION W/ INTRAOCULAR LENS  IMPLANT, BILATERAL Bilateral ~ 2012  . DILATION AND CURETTAGE OF UTERUS    . LUMBAR DISC SURGERY  1970's  . LYMPH NODE DISSECTION  2005   "all my lymph nodes under breasts, went thru my back; Dr. Arlyce Dice did it"  . Needle aspiration Pelvic cyst  2011   Dr Diona Fanti  . VIDEO ASSISTED THORACOSCOPY (VATS)/THOROCOTOMY Right 09/2003  thoracotomy, right upper lobectomy with node dissection; Dr Demetrios Loll 11/02/2010  . VIDEO BRONCHOSCOPY  03/2004   Archie Endo 11/02/2010     OB History   No obstetric history on file.     Family History  Problem Relation Age of Onset  . Heart disease Father   . Rheumatologic disease Brother   . Cancer Brother        Leukemia  . Rheumatologic disease Sister   . Breast cancer Daughter     Social History   Tobacco Use  . Smoking status: Former Smoker    Packs/day: 1.00    Years: 42.00    Pack years:  42.00    Types: Cigarettes    Quit date: 09/22/2003    Years since quitting: 16.4  . Smokeless tobacco: Never Used  Vaping Use  . Vaping Use: Never used  Substance Use Topics  . Alcohol use: Yes    Alcohol/week: 14.0 standard drinks    Types: 14 Glasses of wine per week    Comment: 12/22/2014 "I have a large glass of wine qd"  . Drug use: No    Home Medications Prior to Admission medications   Medication Sig Start Date End Date Taking? Authorizing Provider  ALPRAZolam Duanne Moron) 1 MG tablet TAKE 1 TABLET BY MOUTH EVERYDAY AT BEDTIME 02/12/20   Burns, Claudina Lick, MD  atorvastatin (LIPITOR) 20 MG tablet TAKE 1 TABLET (20 MG TOTAL) BY MOUTH DAILY AT 6 PM. Patient taking differently: Take 20 mg by mouth See admin instructions. Takes on Mon, Wed, and Fri 05/20/19   Binnie Rail, MD  cephALEXin (KEFLEX) 500 MG capsule Take 1 capsule (500 mg total) by mouth 3 (three) times daily. 02/09/20   Virgel Manifold, MD  Cholecalciferol (VITAMIN D) 2000 UNITS tablet Take 1 tablet (2,000 Units total) by mouth daily. 04/29/15   Rowe Clack, MD  FLOVENT HFA 110 MCG/ACT inhaler TAKE 2 PUFFS BY MOUTH TWICE A DAY 01/21/20   Young, Tarri Fuller D, MD  levalbuterol Penne Lash) 1.25 MG/3ML nebulizer solution Take 1.25 mg by nebulization every 6 (six) hours as needed for wheezing. DX: Bronchiectasis 09/27/19   Baird Lyons D, MD  levothyroxine (SYNTHROID) 88 MCG tablet Take 1 tablet (88 mcg total) by mouth daily. 12/25/19   Binnie Rail, MD  Multiple Vitamins-Minerals (CENTRUM SILVER PO) Take 1 tablet by mouth daily.     [provider]  ondansetron (ZOFRAN ODT) 4 MG disintegrating tablet Take 1 tablet (4 mg total) by mouth every 8 (eight) hours as needed for nausea or vomiting. 01/01/20   Binnie Rail, MD  ondansetron (ZOFRAN) 4 MG tablet Take 1 tablet (4 mg total) by mouth 3 (three) times daily as needed for nausea or vomiting. 02/09/20   Virgel Manifold, MD  Probiotic Product (PROBIOTIC PO) Take 1 capsule by mouth  daily.    [provider]  Respiratory Therapy Supplies (FLUTTER) DEVI Blow through 4 times per cycle and repeat 3 cycles per day 10/15/13   Deneise Lever, MD  Respiratory Therapy Supplies (NEBULIZER/TUBING/MOUTHPIECE) KIT As directed 05/24/18   Deneise Lever, MD  pantoprazole (PROTONIX) 40 MG tablet Take 1 tablet (40 mg total) by mouth 2 (two) times daily before a meal. Patient not taking: Reported on 11/26/2019 11/05/19 11/27/19  Binnie Rail, MD    Allergies    Ampicillin, Azithromycin, Codeine, Daliresp [roflumilast], Nitrofurantoin, Other, Penicillins, Sulfonamide derivatives, Tiotropium bromide monohydrate, Cephalosporins, Doxycycline, Protonix [pantoprazole], Fludrocortisone acetate, Lactose intolerance (gi), Latex, and Prednisone  Review  of Systems   Review of Systems All systems reviewed and negative, other than as noted in HPI.  Physical Exam Updated Vital Signs BP (!) 152/79   Pulse 99   Temp (!) 97.5 F (36.4 C) (Oral)   Resp 17   SpO2 100%   Physical Exam Vitals and nursing note reviewed.  Constitutional:      General: She is not in acute distress.    Appearance: She is well-developed.  HENT:     Head: Normocephalic and atraumatic.  Eyes:     General:        Right eye: No discharge.        Left eye: No discharge.     Conjunctiva/sclera: Conjunctivae normal.  Cardiovascular:     Rate and Rhythm: Normal rate and regular rhythm.     Heart sounds: Normal heart sounds. No murmur heard.  No friction rub. No gallop.   Pulmonary:     Effort: Pulmonary effort is normal. No respiratory distress.     Breath sounds: Normal breath sounds.  Abdominal:     General: There is no distension.     Palpations: Abdomen is soft.     Tenderness: There is no abdominal tenderness.  Musculoskeletal:        General: No tenderness.     Cervical back: Neck supple.  Skin:    Comments: Multiple wounds to left upper extremity and some skin tears to her right knee.  The wound  near the dorsal aspect of the left wrist is infected with surrounding cellulitis.  There is a small amount of packing tucked into one of the wounds.  This was removed.  There is no significant purulence noted.  Neurological:     Mental Status: She is alert.  Psychiatric:        Behavior: Behavior normal.        Thought Content: Thought content normal.     ED Results / Procedures / Treatments   Labs (all labs ordered are listed, but only abnormal results are displayed) Labs Reviewed - No data to display  EKG None  Radiology No results found.  Procedures Procedures (including critical care time)  Medications Ordered in ED Medications  cefTRIAXone (ROCEPHIN) injection 1 g (1 g Intramuscular Given 02/09/20 1141)  lidocaine (PF) (XYLOCAINE) 1 % injection (2.1 mLs  Given 02/09/20 1141)    ED Course  I have reviewed the triage vital signs and the nursing notes.  Pertinent labs & imaging results that were available during my care of the patient were reviewed by me and considered in my medical decision making (see chart for details).    MDM Rules/Calculators/A&P                          86 year old female with a cellulitis of left upper extremity from secondary infection of recent skin tears.  There is no abscess.  Packing removed.  Nothing is any utility in replacing it.  Continued wound care was discussed.  Antibiotics.  She has intolerance of many antibiotics but we will see how she does with p.o. outpatient treatment.  No indication for admission and parenteral antibiotics at this time.  Final Clinical Impression(s) / ED Diagnoses Final diagnoses:  Cellulitis of left upper extremity    Rx / DC Orders ED Discharge Orders         Ordered    cephALEXin (KEFLEX) 500 MG capsule  3 times daily  02/09/20 1149    ondansetron (ZOFRAN) 4 MG tablet  3 times daily PRN        02/09/20 1330           Virgel Manifold, MD 02/12/20 1104

## 2020-02-12 NOTE — Telephone Encounter (Signed)
Called pt, LVM.   

## 2020-02-14 ENCOUNTER — Telehealth: Payer: Self-pay | Admitting: Internal Medicine

## 2020-02-14 NOTE — Telephone Encounter (Signed)
PCP does not make calls to pt's personally. Pt can schedule an OV to discuss any concerns. Please schedule.

## 2020-02-14 NOTE — Telephone Encounter (Signed)
Patient cancelled the appointment.  Daughter [name] wants Dr. Quay Burow to call her

## 2020-02-25 DIAGNOSIS — M5186 Other intervertebral disc disorders, lumbar region: Secondary | ICD-10-CM | POA: Diagnosis not present

## 2020-02-25 DIAGNOSIS — F329 Major depressive disorder, single episode, unspecified: Secondary | ICD-10-CM | POA: Diagnosis not present

## 2020-02-25 DIAGNOSIS — S51812D Laceration without foreign body of left forearm, subsequent encounter: Secondary | ICD-10-CM | POA: Diagnosis not present

## 2020-02-25 DIAGNOSIS — M1611 Unilateral primary osteoarthritis, right hip: Secondary | ICD-10-CM | POA: Diagnosis not present

## 2020-02-25 DIAGNOSIS — Z9981 Dependence on supplemental oxygen: Secondary | ICD-10-CM | POA: Diagnosis not present

## 2020-02-25 DIAGNOSIS — Z9181 History of falling: Secondary | ICD-10-CM | POA: Diagnosis not present

## 2020-02-25 DIAGNOSIS — M1711 Unilateral primary osteoarthritis, right knee: Secondary | ICD-10-CM | POA: Diagnosis not present

## 2020-02-25 DIAGNOSIS — Z87891 Personal history of nicotine dependence: Secondary | ICD-10-CM | POA: Diagnosis not present

## 2020-02-25 DIAGNOSIS — S81001D Unspecified open wound, right knee, subsequent encounter: Secondary | ICD-10-CM | POA: Diagnosis not present

## 2020-02-25 DIAGNOSIS — M5030 Other cervical disc degeneration, unspecified cervical region: Secondary | ICD-10-CM | POA: Diagnosis not present

## 2020-02-25 DIAGNOSIS — J449 Chronic obstructive pulmonary disease, unspecified: Secondary | ICD-10-CM | POA: Diagnosis not present

## 2020-02-25 DIAGNOSIS — E079 Disorder of thyroid, unspecified: Secondary | ICD-10-CM | POA: Diagnosis not present

## 2020-02-25 DIAGNOSIS — F419 Anxiety disorder, unspecified: Secondary | ICD-10-CM | POA: Diagnosis not present

## 2020-02-26 ENCOUNTER — Other Ambulatory Visit: Payer: Self-pay

## 2020-02-26 ENCOUNTER — Ambulatory Visit (INDEPENDENT_AMBULATORY_CARE_PROVIDER_SITE_OTHER): Payer: PPO | Admitting: Internal Medicine

## 2020-02-26 ENCOUNTER — Encounter: Payer: Self-pay | Admitting: Internal Medicine

## 2020-02-26 VITALS — BP 110/71 | HR 79 | Temp 98.2°F

## 2020-02-26 DIAGNOSIS — S41102A Unspecified open wound of left upper arm, initial encounter: Secondary | ICD-10-CM

## 2020-02-26 NOTE — Progress Notes (Signed)
   Subjective:     Patient ID: [name], female    DOB: January 25, 1933, 86 y.o.   MRN: [id number]  Chief Complaint  Patient presents with  .  Left upper extremity wounds    HPI: The patient is a 86 y.o. female here for left upper extremity wounds including the right wrist and the upper arm  Patient states she developed a wound wound to her left wrist at the end of July and to her upper arm about 1 to 2 weeks later.   Her daughter is present and helps provide the history.  The wounds were caused from a fall.  She has been using silver alginate dressings daily.  At one point the wound did become infected and she was on clindamycin.  Overall there has been improvement to size and appearance of the wound over the past couple of months.  She denies purulent drainage, erythema or increased warmth to the wound sites.  Review of Systems  All other systems reviewed and are negative.    has a past medical history of Allergic rhinitis, Anxiety, Arthritis, Asthma, Bladder atony, Chronic bronchitis ([place]), COPD (chronic obstructive pulmonary disease) ([place]), Diarrhea, Emphysema of lung ([place]), GERD (gastroesophageal reflux disease), Hyperlipidemia, Non-small cell carcinoma of lung, stage 1 ([place]) (2005), On home oxygen therapy, Pelvic cyst, Pneumonia, and Unspecified hypothyroidism.  has a past surgical history that includes Video assisted thoracoscopy (vats)/thorocotomy (Right, 09/2003); Needle aspiration Pelvic cyst (2011); Cataract extraction w/ intraocular lens  implant, bilateral (Bilateral, ~ 2012); Back surgery; Lumbar disc surgery (1970's); Abdominal hysterectomy (~ 1976); Dilation and curettage of uterus; Lymph node dissection (2005); and Video bronchoscopy (03/2004).  reports that she quit smoking about 16 years ago. Her smoking use included cigarettes. She has a 42.00 pack-year smoking history. She has never used smokeless tobacco. Objective:   Vital Signs BP 110/71 (BP Location: Right Arm,  Patient Position: Sitting, Cuff Size: Normal)   Pulse 79   Temp 98.2 F (36.8 C) (Oral)   SpO2 (!) 75% Comment: Patient on 3 liters of O2 Vital Signs and Nursing Note Reviewed Physical Exam Skin:    Comments: Left wrist wound l measures: 3.0 x 1.0 x 0.1 cm there is granulation tissue present in the wound bed  Right upper extremity wound measures: 1.5 x 3.5 x 0.1 cm, granulation tissue noted throughout the wound bed          Assessment/Plan:     ICD-10-CM   1. Arm wound, left, initial encounter  S41.102A    Assessment: Left arm wounds located to the wrist and upper arm due to fall  Overall wounds appear well-healing with no signs of infection today.  I did recommend switching to silver collagen as she is having minimal drainage.  Orders will be sent to home health.  Plan -In office change with Adaptic and Vaseline covered with nonstick pad and wrapped with Kerlix -Silver collagen daily with dressing changes -Follow-up in 1 month  Boyd Kerbs, DO 02/26/2020, 3:13 PM

## 2020-02-26 NOTE — Patient Instructions (Signed)
[name] It was a pleasure meeting you today.  Please follow the instructions below for your wound care  1) silver collage dressing changes daily 2) followed by non stick pad 3) wrap with gauze   Please follow up with me in 4 weeks.  Call us at [phone] with any questions or concerns

## 2020-02-27 ENCOUNTER — Telehealth: Payer: Self-pay | Admitting: Internal Medicine

## 2020-02-27 ENCOUNTER — Telehealth: Payer: Self-pay | Admitting: *Deleted

## 2020-02-27 NOTE — Telephone Encounter (Signed)
Called and [facility] @ 8:41am) for Cara,RN-[facility].  Regard new wound care orders for the patient.//AB/CMA

## 2020-02-27 NOTE — Telephone Encounter (Signed)
Spoke with the Cara,RN w/ [facility] and informed her of new wound care orders for the patient.     Left upper arm and wrist:Place Ag Collagen then non-stick pad and wrap with Kerlix.  Change dressing daily.  Cara,RN she asked if the supplies was sent home with the patient and I informed her that they were not.  She verbalized understanding and agreed.//AB/CMA

## 2020-02-27 NOTE — Telephone Encounter (Signed)
    [name] from [facility] requesting verbal order for home PT Phone [phone], ok to leave message [email or web address]

## 2020-02-28 NOTE — Telephone Encounter (Signed)
ok 

## 2020-02-28 NOTE — Telephone Encounter (Signed)
Notified [name] w/MD response.Michelle KitchenJohny Chess

## 2020-03-04 NOTE — Telephone Encounter (Signed)
   Anda Kraft from [facility] calling to request verbal order for home PT 1x week for 4  Call [phone], ok to leave message

## 2020-03-04 NOTE — Telephone Encounter (Signed)
Notified Anda Kraft MD ok'd PT orders.Marland KitchenJohny Esparza

## 2020-03-09 ENCOUNTER — Other Ambulatory Visit: Payer: Self-pay | Admitting: Internal Medicine

## 2020-03-10 ENCOUNTER — Telehealth: Payer: Self-pay | Admitting: Internal Medicine

## 2020-03-10 DIAGNOSIS — M1711 Unilateral primary osteoarthritis, right knee: Secondary | ICD-10-CM | POA: Diagnosis not present

## 2020-03-10 DIAGNOSIS — E079 Disorder of thyroid, unspecified: Secondary | ICD-10-CM | POA: Diagnosis not present

## 2020-03-10 DIAGNOSIS — F419 Anxiety disorder, unspecified: Secondary | ICD-10-CM | POA: Diagnosis not present

## 2020-03-10 DIAGNOSIS — S51812D Laceration without foreign body of left forearm, subsequent encounter: Secondary | ICD-10-CM | POA: Diagnosis not present

## 2020-03-10 DIAGNOSIS — M5186 Other intervertebral disc disorders, lumbar region: Secondary | ICD-10-CM | POA: Diagnosis not present

## 2020-03-10 DIAGNOSIS — S81001D Unspecified open wound, right knee, subsequent encounter: Secondary | ICD-10-CM | POA: Diagnosis not present

## 2020-03-10 DIAGNOSIS — Z87891 Personal history of nicotine dependence: Secondary | ICD-10-CM | POA: Diagnosis not present

## 2020-03-10 DIAGNOSIS — Z9181 History of falling: Secondary | ICD-10-CM | POA: Diagnosis not present

## 2020-03-10 DIAGNOSIS — M5030 Other cervical disc degeneration, unspecified cervical region: Secondary | ICD-10-CM | POA: Diagnosis not present

## 2020-03-10 DIAGNOSIS — M1611 Unilateral primary osteoarthritis, right hip: Secondary | ICD-10-CM | POA: Diagnosis not present

## 2020-03-10 DIAGNOSIS — F329 Major depressive disorder, single episode, unspecified: Secondary | ICD-10-CM | POA: Diagnosis not present

## 2020-03-10 DIAGNOSIS — Z9981 Dependence on supplemental oxygen: Secondary | ICD-10-CM | POA: Diagnosis not present

## 2020-03-10 DIAGNOSIS — J449 Chronic obstructive pulmonary disease, unspecified: Secondary | ICD-10-CM | POA: Diagnosis not present

## 2020-03-10 NOTE — Telephone Encounter (Signed)
Patients daughter called to express some concerns about her mother. She said when she eats she then starts to spit up and has acid reflux, the patient is not walking, she does not want to see the physical therapist. Daughter said that her mother is also not taking the medication that was prescribed for acid reflux that she is only taking it in the morning when she eats and the daughter said that she hardly eats breakfast. The daughter said that her mother has not walked since 01/08/2020 and is concerned about this.

## 2020-03-11 NOTE — Telephone Encounter (Signed)
Is she depressed?  Obviously taking the reflux medication twice a day may help.   Should I order a palliative care consult so someone can come out to her and help manage some of her chronic conditions?

## 2020-03-11 NOTE — Telephone Encounter (Signed)
Called pt/daughter [name]) there was no answer LMOM RTC

## 2020-03-12 ENCOUNTER — Other Ambulatory Visit: Payer: Self-pay

## 2020-03-12 ENCOUNTER — Encounter: Payer: Self-pay | Admitting: Internal Medicine

## 2020-03-12 ENCOUNTER — Ambulatory Visit (INDEPENDENT_AMBULATORY_CARE_PROVIDER_SITE_OTHER): Payer: PPO

## 2020-03-12 ENCOUNTER — Ambulatory Visit (INDEPENDENT_AMBULATORY_CARE_PROVIDER_SITE_OTHER): Payer: PPO | Admitting: Internal Medicine

## 2020-03-12 VITALS — BP 106/70 | HR 104 | Temp 97.7°F | Ht 66.0 in | Wt 133.2 lb

## 2020-03-12 DIAGNOSIS — J441 Chronic obstructive pulmonary disease with (acute) exacerbation: Secondary | ICD-10-CM | POA: Diagnosis not present

## 2020-03-12 DIAGNOSIS — J449 Chronic obstructive pulmonary disease, unspecified: Secondary | ICD-10-CM | POA: Diagnosis not present

## 2020-03-12 DIAGNOSIS — J9611 Chronic respiratory failure with hypoxia: Secondary | ICD-10-CM

## 2020-03-12 MED ORDER — AZITHROMYCIN 250 MG PO TABS: ORAL_TABLET | ORAL | 0 refills | Status: DC

## 2020-03-12 MED ORDER — AZITHROMYCIN 250 MG PO TABS
ORAL_TABLET | ORAL | 0 refills | Status: DC
Start: 2020-03-12 — End: 2020-03-26

## 2020-03-12 NOTE — Patient Instructions (Signed)
Order- - CXR dx COPD exacerbation  Script printed for Zpak antibiotic- try to take it  Ok to wait on flu vaccine for now  I suggest Covid vaccine when able  Please call if we can help

## 2020-03-12 NOTE — Telephone Encounter (Signed)
Follow up message  Please return call to daughter

## 2020-03-12 NOTE — Telephone Encounter (Signed)
Called pt daughter again still no answer LMOM w/MD response, and ask for her to RTC w/what she is wanting MD to order.Marland KitchenJohny Esparza

## 2020-03-12 NOTE — Progress Notes (Signed)
Patient ID: [name], female    DOB: 05-Jan-1933, 86 y.o.   MRN: [id number]  HPI female former smoker followed for history of right upper lobe resection/NSSCA, COPD, chronic hypoxic respiratory failure, allergic rhinitis, complicated by glaucoma Oximetry walk test 07/06/2015-desaturation to 85% while walking on room air. Saturation sustained at 97% on 2 L prongs  ----------------------------------------------------------------------------- .   08/26/19- 86 year old female former smoker followed for history of right upper lobe resection/NSSCA, COPD, chronic hypoxic respiratory failure, allergic rhinitis, complicated by glaucoma O2 3L/ [employer] [place] Xopenex 1.25, Flovent 110,  ----f/u Cough/Bronchiectasis without complication/COPD.Past COVID 05/2019.O2 3l/min Covid infection treated at home in December. Family here. For 2-3 weeks notes increased productive cough yellow, green, no fever or blood. Zpak at onset gave nausea and diarrhea= common problem with most antibiotics. Hx diverticulosis w/o abd pain. CXR 05/20/2019- Similar appearance of pleuroparenchymal scarring at the base of the right hemithorax as compared to prior examination 02/26/2019. No convincing superimposed airspace disease. Left lung clear. Sequela of prior right upper lobectomy. Aortic atherosclerosis.  03/12/20- 86 year old female former smoker followed for history of right upper lobe resection/NSSCA, COPD, chronic hypoxic respiratory failure, allergic rhinitis, complicated by Glaucoma, GERD, Hypothyroid, Peripheral Neuropathy, DDD Lumbar Spine, Covid infecton Dec, 2020.  O2 3L/ [employer] Neb Xopenex 1.25, Flovent hfa 110,  Covid vax- none Flu vax-  Had been getting HHN care for skin tear/ cellulitis LUE after a fall at home in July. ------Pt's breathing has been worse within the last 2 weeks and pt also has problems with congestion and cough. Pt is coughing up green phlegm. Pt also has complaints of wheezing.     Here with daughter Chronic GI upset with historically poor GI tolerance of antibiotics. She doubts UTI, No fever.Says she can keep Zpak down.  Had Covid infection in December, outpatient. CXR 11/26/19-  IMPRESSION: No acute disease. Aortic Atherosclerosis (ICD10-I70.0) and Emphysema (ICD10-J43.9).  ROS-see HPI  "+" = positive Constitutional:    weight loss, night sweats, fevers, chills, fatigue, lassitude. HEENT:    headaches, difficulty swallowing, tooth/dental problems, sore throat,       sneezing, itching, ear ache, nasal congestion, post nasal drip, snoring CV:     chest pain, orthopnea, PND, swelling in lower extremities, anasarca,                                                        dizziness, palpitations Resp:   + shortness of breath with exertion or at rest.               + productive cough,   non-productive cough, coughing up of blood.              change in color of mucus.  wheezing.   Skin:    rash or lesions. GI:  No-   heartburn, indigestion, abdominal pain, nausea, vomiting,  GU:  MS:   + joint pain, stiffness, . Neuro-     nothing unusual Psych:  change in mood or affect.  depression or anxiety.   memory loss.   Objective:   OBJ- Physical Exam General- Alert, Oriented, Affect-appropriate, Distress- none acute, + Wearing O2 3 L pulse + wheelchair. +frail Skin- rash-none, lesions- none, excoriation- none Lymphadenopathy- none Head- atraumatic            Eyes-  Gross vision intact, PERRLA, conjunctivae and secretions clear            Ears- Hearing, canals-normal            Nose- Clear, no-Septal dev, mucus, polyps, erosion, perforation             Throat- Mallampati II , mucosa clear , drainage- none, tonsils- atrophic Neck- flexible , trachea midline, no stridor , thyroid nl, carotid no bruit Chest - symmetrical excursion , unlabored           Heart/CV- RRR , no murmur , no gallop  , no rub, nl s1 s2                           - JVD- none , edema- none, stasis  changes- none, varices- none           Lung-   dullness-none, rub- none,  wheeze-none. Cough+ raspy.           Chest wall- + remote right thoracotomy for right  lobectomy Abd-  Br/ Gen/ Rectal- Not done, not indicated Extrem-+ dressing on left forearm Neuro- grossly intact to observation

## 2020-03-13 NOTE — Telephone Encounter (Signed)
Called pt/daughter back she was at her moms house. Pt states she is not depressed and wanted to know why we asked her that. Inform pt/daughter she asked the question because of the msg she received. She is not taking the pantoprazole.. she states she is not going to take the medication either. Daughter states she is not sure if palliative care w/help, or needed. She states she is just overwhelmed. She is trying to do everything by herself. She states that her dad also has cognitive dementia " short term". Mom is just stubborn and afraid that she will fall while doing the PT. She states she will call back if it gets any worse.Marland KitchenJohny Chess

## 2020-03-13 NOTE — Telephone Encounter (Signed)
Noted.  I was concerned she may have been depressed because of not taking the medication, not eating much and not wanting to participate in PT.  If the daughter calls back we can order palliative care, but I am not sure how else we can help since a home health aide or other services would not be covered by insurance.

## 2020-03-17 NOTE — Assessment & Plan Note (Signed)
She continues to need O2 Plan- continue 3L

## 2020-03-17 NOTE — Assessment & Plan Note (Signed)
Exacerbation. Likely bacterial. Already on probiotic to help GI. She thinks she can keep Zpak down. Plan Zpak. Defer flu vax until better. Covid vax when able.

## 2020-03-20 ENCOUNTER — Telehealth: Payer: Self-pay | Admitting: Internal Medicine

## 2020-03-20 DIAGNOSIS — M5186 Other intervertebral disc disorders, lumbar region: Secondary | ICD-10-CM | POA: Diagnosis not present

## 2020-03-20 DIAGNOSIS — S51812D Laceration without foreign body of left forearm, subsequent encounter: Secondary | ICD-10-CM | POA: Diagnosis not present

## 2020-03-20 DIAGNOSIS — F329 Major depressive disorder, single episode, unspecified: Secondary | ICD-10-CM | POA: Diagnosis not present

## 2020-03-20 DIAGNOSIS — Z9981 Dependence on supplemental oxygen: Secondary | ICD-10-CM | POA: Diagnosis not present

## 2020-03-20 DIAGNOSIS — E079 Disorder of thyroid, unspecified: Secondary | ICD-10-CM | POA: Diagnosis not present

## 2020-03-20 DIAGNOSIS — M1611 Unilateral primary osteoarthritis, right hip: Secondary | ICD-10-CM | POA: Diagnosis not present

## 2020-03-20 DIAGNOSIS — J449 Chronic obstructive pulmonary disease, unspecified: Secondary | ICD-10-CM | POA: Diagnosis not present

## 2020-03-20 DIAGNOSIS — Z9181 History of falling: Secondary | ICD-10-CM | POA: Diagnosis not present

## 2020-03-20 DIAGNOSIS — F419 Anxiety disorder, unspecified: Secondary | ICD-10-CM | POA: Diagnosis not present

## 2020-03-20 DIAGNOSIS — M1711 Unilateral primary osteoarthritis, right knee: Secondary | ICD-10-CM | POA: Diagnosis not present

## 2020-03-20 DIAGNOSIS — M5030 Other cervical disc degeneration, unspecified cervical region: Secondary | ICD-10-CM | POA: Diagnosis not present

## 2020-03-20 DIAGNOSIS — Z87891 Personal history of nicotine dependence: Secondary | ICD-10-CM | POA: Diagnosis not present

## 2020-03-20 DIAGNOSIS — S81001D Unspecified open wound, right knee, subsequent encounter: Secondary | ICD-10-CM | POA: Diagnosis not present

## 2020-03-20 NOTE — Telephone Encounter (Signed)
Marcene Brawn with [facility] called and is requesting verbals to continue nursing for wound care for 1w4. The patient was almost healed but she fell Tuesday and Wednesday and she has new skin tears.   [phone]

## 2020-03-21 ENCOUNTER — Other Ambulatory Visit: Payer: Self-pay | Admitting: Internal Medicine

## 2020-03-23 DIAGNOSIS — M1711 Unilateral primary osteoarthritis, right knee: Secondary | ICD-10-CM | POA: Diagnosis not present

## 2020-03-23 DIAGNOSIS — F419 Anxiety disorder, unspecified: Secondary | ICD-10-CM | POA: Diagnosis not present

## 2020-03-23 DIAGNOSIS — S81001D Unspecified open wound, right knee, subsequent encounter: Secondary | ICD-10-CM | POA: Diagnosis not present

## 2020-03-23 DIAGNOSIS — E079 Disorder of thyroid, unspecified: Secondary | ICD-10-CM | POA: Diagnosis not present

## 2020-03-23 DIAGNOSIS — J449 Chronic obstructive pulmonary disease, unspecified: Secondary | ICD-10-CM | POA: Diagnosis not present

## 2020-03-23 DIAGNOSIS — M5030 Other cervical disc degeneration, unspecified cervical region: Secondary | ICD-10-CM | POA: Diagnosis not present

## 2020-03-23 DIAGNOSIS — Z87891 Personal history of nicotine dependence: Secondary | ICD-10-CM | POA: Diagnosis not present

## 2020-03-23 DIAGNOSIS — F329 Major depressive disorder, single episode, unspecified: Secondary | ICD-10-CM | POA: Diagnosis not present

## 2020-03-23 DIAGNOSIS — Z9181 History of falling: Secondary | ICD-10-CM | POA: Diagnosis not present

## 2020-03-23 DIAGNOSIS — Z9981 Dependence on supplemental oxygen: Secondary | ICD-10-CM | POA: Diagnosis not present

## 2020-03-23 DIAGNOSIS — M5186 Other intervertebral disc disorders, lumbar region: Secondary | ICD-10-CM | POA: Diagnosis not present

## 2020-03-23 DIAGNOSIS — S51812D Laceration without foreign body of left forearm, subsequent encounter: Secondary | ICD-10-CM | POA: Diagnosis not present

## 2020-03-23 DIAGNOSIS — M1611 Unilateral primary osteoarthritis, right hip: Secondary | ICD-10-CM | POA: Diagnosis not present

## 2020-03-23 NOTE — Telephone Encounter (Signed)
Need verbal order to extend therapy once a week for 8 weeks  [phone]

## 2020-03-24 NOTE — Telephone Encounter (Signed)
Ok for verbals 

## 2020-03-25 NOTE — Telephone Encounter (Signed)
Left message today with verbal ok for orders.

## 2020-03-26 ENCOUNTER — Other Ambulatory Visit: Payer: Self-pay

## 2020-03-26 ENCOUNTER — Telehealth: Payer: Self-pay | Admitting: Internal Medicine

## 2020-03-26 ENCOUNTER — Ambulatory Visit (INDEPENDENT_AMBULATORY_CARE_PROVIDER_SITE_OTHER): Payer: PPO | Admitting: Internal Medicine

## 2020-03-26 ENCOUNTER — Encounter: Payer: Self-pay | Admitting: Internal Medicine

## 2020-03-26 VITALS — BP 122/62 | HR 90 | Temp 98.7°F | Resp 16 | Ht 66.0 in

## 2020-03-26 DIAGNOSIS — K1120 Sialoadenitis, unspecified: Secondary | ICD-10-CM

## 2020-03-26 MED ORDER — CEFDINIR 250 MG/5ML PO SUSR: 300.0000 mg | Freq: Two times a day (BID) | ORAL | 0 refills | Status: DC

## 2020-03-26 MED ORDER — ONDANSETRON HCL 4 MG PO TABS: 4.0000 mg | ORAL_TABLET | Freq: Three times a day (TID) | ORAL | 0 refills | Status: AC | PRN

## 2020-03-26 NOTE — Telephone Encounter (Signed)
Needs to be seen today

## 2020-03-26 NOTE — Telephone Encounter (Signed)
Dr. Ronnald Ramp has agreed to work patient in today. Amy will call her to get her scheduled.

## 2020-03-26 NOTE — Telephone Encounter (Signed)
Faythe Dingwall, RN with [facility] called and states that the patient needs to be seen in the next 4 hrs for swelling and redness of the neck under her left ear lobe.   Please call the patient back [phone]

## 2020-03-26 NOTE — Patient Instructions (Signed)
Parotitis  Parotitis is inflammation of one or both of your parotid glands. These glands produce saliva. They are found on each side of your face, below and in front of your earlobes. The saliva that they produce comes out of tiny openings (ducts) inside your cheeks. Parotitis may cause sudden swelling and pain (acute parotitis). It can also cause repeated episodes of swelling and pain or continued swelling that may or may not be painful (chronic parotitis). What are the causes? This condition may be caused by:  Infections from bacteria.  Infections from viruses, such as mumps or HIV.  Blockage (obstruction) of saliva flow through the parotid glands. This can be from a stone, scar tissue, or a tumor.  Diseases that cause your body's defense system (immune system) to attack healthy cells in your salivary glands. These are called autoimmune diseases. What increases the risk? You are more likely to develop this condition if:  You are 63 years old or older.  You do not drink enough fluids (are dehydrated).  You drink too much alcohol.  You have: ? A dry mouth. ? Poor dental hygiene. ? Diabetes. ? Gout. ? A long-term illness.  You have had radiation treatments to the head and neck.  You take certain medicines. What are the signs or symptoms? Symptoms of this condition depend on the cause. Symptoms may include:  Swelling under and in front of the ear. This may get worse after eating.  Redness of the skin over the parotid gland.  Pain and tenderness over the parotid gland. This may get worse after eating.  Fever or chills.  Pus coming from the ducts inside the mouth.  Dry mouth.  A bad taste in the mouth. How is this diagnosed? This condition may be diagnosed based on:  Your medical history.  A physical exam.  Tests to find the cause of the parotitis. These may include: ? Doing blood tests to check for an autoimmune disease or infections from a virus. ? Taking a  fluid sample from the parotid gland and testing it for infection. ? Injecting the ducts of the parotid gland with a dye and then taking X-rays (sialogram). ? Having other imaging tests of the gland, such as X-rays, ultrasound, MRI, or CT scan. ? Checking the opening of the gland for a stone or obstruction. ? Placing a needle into the gland to remove tissue for a biopsy (fine needle aspiration). How is this treated? Treatment for this condition depends on the cause. Treatment may include:  Antibiotic medicine for a bacterial infection.  Drinking more fluids.  Removing a stone or obstruction.  Treating an underlying disease that is causing parotitis.  Surgery to drain an infection, remove a growth, or remove the whole gland (parotidectomy). Treatment may not be needed if parotid swelling goes away with home care. Follow these instructions at home: Medicines   Take over-the-counter and prescription medicines only as told by your health care provider.  If you were prescribed an antibiotic medicine, take it as told by your health care provider. Do not stop taking the antibiotic even if you start to feel better. Managing pain and swelling  If directed, apply heat to the affected area as often as told by your health care provider. Use the heat source that your health care provider recommends, such as a moist heat pack or a heating pad. To apply the heat: ? Place a towel between your skin and the heat source. ? Leave the heat on for 20-30 minutes. ?  Remove the heat if your skin turns bright red. This is especially important if you are unable to feel pain, heat, or cold. You may have a greater risk of getting burned.  Gargle with a salt-water mixture 3-4 times a day or as needed. To make a salt-water mixture, completely dissolve -1 tsp (3-6 g) of salt in 1 cup (237 mL) of warm water.  Gently massage the parotid glands as told by your health care provider. General instructions   Drink  enough fluid to keep your urine pale yellow.  Keep your mouth clean and moist.  Try sucking on sour candy. This may help to make your mouth less dry by stimulating the flow of saliva.  Maintain good oral health. ? Brush your teeth at least two times a day. ? Floss your teeth every day. ? See your dentist regularly.  Do not use any products that contain nicotine or tobacco, such as cigarettes, e-cigarettes, and chewing tobacco. If you need help quitting, ask your health care provider.  Do not drink alcohol.  Keep all follow-up visits as told by your health care provider. This is important. Contact a health care provider if:  You have a fever or chills.  You have new symptoms.  Your symptoms get worse.  Your symptoms do not improve with treatment. Get help right away if:  You have difficulty breathing or swallowing because of the swollen gland. Summary  Parotitis is inflammation of one or both of your parotid glands.  Symptoms include pain and swelling under and in front of the ear. They may also include a fever and a bad taste in your mouth.  This condition may be treated with antibiotics, increasing fluids, or surgery.  In some cases, parotitis may go away on its own without treatment.  You should drink plenty of fluids, maintain good oral hygiene, and avoid tobacco products. This information is not intended to replace advice given to you by your health care provider. Make sure you discuss any questions you have with your health care provider. Document Revised: 01/02/2018 Document Reviewed: 01/02/2018 Elsevier Patient Education  [place].

## 2020-03-27 DIAGNOSIS — J449 Chronic obstructive pulmonary disease, unspecified: Secondary | ICD-10-CM | POA: Diagnosis not present

## 2020-03-27 DIAGNOSIS — Z9981 Dependence on supplemental oxygen: Secondary | ICD-10-CM

## 2020-03-27 DIAGNOSIS — Z9181 History of falling: Secondary | ICD-10-CM

## 2020-03-27 DIAGNOSIS — M5186 Other intervertebral disc disorders, lumbar region: Secondary | ICD-10-CM

## 2020-03-27 DIAGNOSIS — Z87891 Personal history of nicotine dependence: Secondary | ICD-10-CM

## 2020-03-27 DIAGNOSIS — M1611 Unilateral primary osteoarthritis, right hip: Secondary | ICD-10-CM | POA: Diagnosis not present

## 2020-03-27 DIAGNOSIS — S81001D Unspecified open wound, right knee, subsequent encounter: Secondary | ICD-10-CM | POA: Diagnosis not present

## 2020-03-27 DIAGNOSIS — F329 Major depressive disorder, single episode, unspecified: Secondary | ICD-10-CM | POA: Diagnosis not present

## 2020-03-27 DIAGNOSIS — M5030 Other cervical disc degeneration, unspecified cervical region: Secondary | ICD-10-CM | POA: Diagnosis not present

## 2020-03-27 DIAGNOSIS — M1711 Unilateral primary osteoarthritis, right knee: Secondary | ICD-10-CM

## 2020-03-27 DIAGNOSIS — F419 Anxiety disorder, unspecified: Secondary | ICD-10-CM | POA: Diagnosis not present

## 2020-03-27 DIAGNOSIS — S51812D Laceration without foreign body of left forearm, subsequent encounter: Secondary | ICD-10-CM | POA: Diagnosis not present

## 2020-03-27 DIAGNOSIS — E079 Disorder of thyroid, unspecified: Secondary | ICD-10-CM

## 2020-03-27 NOTE — Progress Notes (Signed)
Subjective:  Patient ID: [name], female    DOB: [birth year]  Age: 86 y.o. MRN: [id number]  CC: Facial Swelling  This visit occurred during the SARS-CoV-2 public health emergency.  Safety protocols were in place, including screening questions prior to the visit, additional usage of staff PPE, and extensive cleaning of exam room while observing appropriate contact time as indicated for disinfecting solutions.    HPI [name] presents for a 3-day history of pain and swelling in front of her left ear.  She has had this before.  She denies headache, vomiting, fever, chills, trouble breathing, or trouble swallowing.  She has chronic unchanged nausea.  Outpatient Medications Prior to Visit  Medication Sig Dispense Refill   ALPRAZolam (XANAX) 1 MG tablet TAKE 1 TABLET BY MOUTH EVERYDAY AT BEDTIME 30 tablet 0   atorvastatin (LIPITOR) 20 MG tablet TAKE 1 TABLET (20 MG TOTAL) BY MOUTH DAILY AT 6 PM. (Patient taking differently: Take 20 mg by mouth See admin instructions. Takes on Mon, Wed, and Fri) 90 tablet 1   Cholecalciferol (VITAMIN D) 2000 UNITS tablet Take 1 tablet (2,000 Units total) by mouth daily. 30 tablet 11   FLOVENT HFA 110 MCG/ACT inhaler TAKE 2 PUFFS BY MOUTH TWICE A DAY 12 Inhaler 5   levalbuterol (XOPENEX) 1.25 MG/3ML nebulizer solution Take 1.25 mg by nebulization every 6 (six) hours as needed for wheezing. DX: Bronchiectasis 180 mL 5   levothyroxine (SYNTHROID) 88 MCG tablet TAKE 1 TABLET BY MOUTH EVERY DAY 90 tablet 0   Multiple Vitamins-Minerals (CENTRUM SILVER PO) Take 1 tablet by mouth daily.      pantoprazole (PROTONIX) 40 MG tablet Take 40 mg by mouth 2 (two) times daily.      Probiotic Product (PROBIOTIC PO) Take 1 capsule by mouth daily.     Respiratory Therapy Supplies (FLUTTER) DEVI Blow through 4 times per cycle and repeat 3 cycles per day 1 each 0   azithromycin (ZITHROMAX Z-PAK) 250 MG tablet 2 tabs today, then 1 daily until gone. 6 each 0     ondansetron (ZOFRAN) 4 MG tablet Take 1 tablet (4 mg total) by mouth 3 (three) times daily as needed for nausea or vomiting. 21 tablet 0   No facility-administered medications prior to visit.    ROS Review of Systems  Constitutional: Negative for appetite change, fatigue and fever.  HENT: Positive for facial swelling. Negative for ear pain, rhinorrhea, sinus pressure and trouble swallowing.   Eyes: Negative.   Respiratory: Negative for cough, chest tightness, shortness of breath and wheezing.   Cardiovascular: Negative for chest pain, palpitations and leg swelling.  Gastrointestinal: Positive for nausea. Negative for abdominal pain, diarrhea and vomiting.  Endocrine: Negative.   Genitourinary: Negative.  Negative for difficulty urinating.  Musculoskeletal: Negative.   Skin: Negative.  Negative for rash.  Neurological: Negative.  Negative for dizziness, weakness, light-headedness and numbness.  Hematological: Negative for adenopathy. Does not bruise/bleed easily.  Psychiatric/Behavioral: Negative.     Objective:  BP 122/62    Pulse 90    Temp 98.7 F (37.1 C) (Oral)    Resp 16    Ht 5\' 6"  (1.676 m)    SpO2 94%    BMI 21.50 kg/m   BP Readings from Last 3 Encounters:  03/26/20 122/62  03/12/20 106/70  02/26/20 110/71    Wt Readings from Last 3 Encounters:  03/12/20 133 lb 3.2 oz (60.4 kg)  01/01/20 133 lb (60.3 kg)  11/05/19 128 lb 12.8  oz (58.4 kg)    Physical Exam Vitals reviewed.  Constitutional:      General: She is not in acute distress.    Appearance: She is ill-appearing (O2 depen, in a wheelchair). She is not toxic-appearing or diaphoretic.  HENT:     Head:      Right Ear: Tympanic membrane and ear canal normal.     Left Ear: Tympanic membrane and ear canal normal.     Nose: Nose normal.     Mouth/Throat:     Mouth: Mucous membranes are moist.  Eyes:     General: No scleral icterus.    Conjunctiva/sclera: Conjunctivae normal.  Cardiovascular:     Rate  and Rhythm: Normal rate and regular rhythm.     Heart sounds: No murmur heard.   Pulmonary:     Effort: Pulmonary effort is normal.     Breath sounds: No stridor. No wheezing, rhonchi or rales.  Abdominal:     General: Abdomen is flat.     Tenderness: There is no abdominal tenderness.  Musculoskeletal:        General: Normal range of motion.     Cervical back: Neck supple.     Right lower leg: No edema.     Left lower leg: No edema.  Lymphadenopathy:     Head:     Right side of head: No submental, submandibular, tonsillar, preauricular, posterior auricular or occipital adenopathy.     Left side of head: No submental, submandibular, tonsillar, preauricular, posterior auricular or occipital adenopathy.     Cervical: No cervical adenopathy.     Upper Body:     Right upper body: No supraclavicular or axillary adenopathy.     Left upper body: No supraclavicular or axillary adenopathy.  Skin:    General: Skin is warm and dry.     Findings: No rash.  Neurological:     General: No focal deficit present.     Mental Status: She is alert.  Psychiatric:        Mood and Affect: Mood normal.        Behavior: Behavior normal.     Lab Results  Component Value Date   WBC 5.7 01/01/2020   HGB 11.1 (L) 01/01/2020   HCT 34.5 (L) 01/01/2020   PLT 213 01/01/2020   GLUCOSE 90 01/01/2020   CHOL 183 11/05/2019   TRIG 69.0 11/05/2019   HDL 87.50 11/05/2019   LDLDIRECT 152.8 04/10/2013   LDLCALC 81 11/05/2019   ALT 25 01/01/2020   AST 45 (H) 01/01/2020   NA 134 (L) 01/01/2020   K 4.1 01/01/2020   CL 95 (L) 01/01/2020   CREATININE 0.65 01/01/2020   BUN 4 (L) 01/01/2020   CO2 33 (H) 01/01/2020   TSH 1.46 11/05/2019   INR 0.94 02/05/2010   HGBA1C 4.7 11/05/2019    No results found.  Assessment & Plan:   [name] was seen today for facial swelling.  Diagnoses and all orders for this visit:  Sialadenitis- Will treat with a broad-spectrum cephalosporin antibiotic.  She is getting  adequately from the pain with Tylenol.  She will take Zofran as needed for nausea and vomiting.  She will start using sour candies to help the parotid gland drain.  She and her daughter will let us know if she develops any new or worsening symptoms. -     cefdinir (OMNICEF) 250 MG/5ML suspension; Take 6 mLs (300 mg total) by mouth 2 (two) times daily. -  ondansetron (ZOFRAN) 4 MG tablet; Take 1 tablet (4 mg total) by mouth 3 (three) times daily as needed for nausea or vomiting.   I have discontinued [name]. [name]'s azithromycin. I am also having her start on cefdinir. Additionally, I am having her maintain her Multiple Vitamins-Minerals (CENTRUM SILVER PO), Flutter, Vitamin D, Probiotic Product (PROBIOTIC PO), atorvastatin, levalbuterol, Flovent HFA, ALPRAZolam, pantoprazole, levothyroxine, and ondansetron.  Meds ordered this encounter  Medications   cefdinir (OMNICEF) 250 MG/5ML suspension    Sig: Take 6 mLs (300 mg total) by mouth 2 (two) times daily.    Dispense:  200 mL    Refill:  0   ondansetron (ZOFRAN) 4 MG tablet    Sig: Take 1 tablet (4 mg total) by mouth 3 (three) times daily as needed for nausea or vomiting.    Dispense:  40 tablet    Refill:  0     Follow-up: Return if symptoms worsen or fail to improve.  Scarlette Calico, MD

## 2020-03-27 NOTE — Telephone Encounter (Signed)
[facility] Report/Call : ---Caller states her mothers neck is swollen on the left side. Foot is also swollen, left foot. Patient fell 3 times last week. She has a hx of swollen salivary gland last year.  Advised see PCP within 4 hours.  Patient saw Dr.Jones.

## 2020-04-07 ENCOUNTER — Inpatient Hospital Stay (HOSPITAL_COMMUNITY)
Admission: EM | Admit: 2020-04-07 | Discharge: 2020-04-17 | DRG: 280 | Disposition: A | Discharge status: Skilled Nursing Facility | Payer: PPO | Attending: Internal Medicine | Admitting: Internal Medicine

## 2020-04-07 ENCOUNTER — Emergency Department (HOSPITAL_COMMUNITY): Payer: PPO

## 2020-04-07 ENCOUNTER — Encounter (HOSPITAL_COMMUNITY): Payer: Self-pay | Admitting: Emergency Medicine

## 2020-04-07 ENCOUNTER — Other Ambulatory Visit: Payer: Self-pay

## 2020-04-07 ENCOUNTER — Inpatient Hospital Stay (HOSPITAL_COMMUNITY)
Admit: 2020-04-07 | Discharge: 2020-04-07 | Disposition: A | Discharge status: Home / Self Care | Payer: PPO | Attending: Internal Medicine | Admitting: Internal Medicine

## 2020-04-07 ENCOUNTER — Inpatient Hospital Stay (HOSPITAL_COMMUNITY): Payer: PPO

## 2020-04-07 DIAGNOSIS — Z888 Allergy status to other drugs, medicaments and biological substances status: Secondary | ICD-10-CM

## 2020-04-07 DIAGNOSIS — Z7401 Bed confinement status: Secondary | ICD-10-CM | POA: Diagnosis not present

## 2020-04-07 DIAGNOSIS — I700 Atherosclerosis of aorta: Secondary | ICD-10-CM | POA: Diagnosis not present

## 2020-04-07 DIAGNOSIS — J189 Pneumonia, unspecified organism: Secondary | ICD-10-CM

## 2020-04-07 DIAGNOSIS — M19041 Primary osteoarthritis, right hand: Secondary | ICD-10-CM | POA: Diagnosis present

## 2020-04-07 DIAGNOSIS — D638 Anemia in other chronic diseases classified elsewhere: Secondary | ICD-10-CM | POA: Diagnosis not present

## 2020-04-07 DIAGNOSIS — Y900 Blood alcohol level of less than 20 mg/100 ml: Secondary | ICD-10-CM | POA: Diagnosis present

## 2020-04-07 DIAGNOSIS — M160 Bilateral primary osteoarthritis of hip: Secondary | ICD-10-CM | POA: Diagnosis present

## 2020-04-07 DIAGNOSIS — F1010 Alcohol abuse, uncomplicated: Secondary | ICD-10-CM | POA: Diagnosis present

## 2020-04-07 DIAGNOSIS — F10239 Alcohol dependence with withdrawal, unspecified: Secondary | ICD-10-CM | POA: Diagnosis present

## 2020-04-07 DIAGNOSIS — Z9981 Dependence on supplemental oxygen: Secondary | ICD-10-CM | POA: Diagnosis not present

## 2020-04-07 DIAGNOSIS — J449 Chronic obstructive pulmonary disease, unspecified: Secondary | ICD-10-CM | POA: Diagnosis not present

## 2020-04-07 DIAGNOSIS — N179 Acute kidney failure, unspecified: Secondary | ICD-10-CM | POA: Diagnosis not present

## 2020-04-07 DIAGNOSIS — R7989 Other specified abnormal findings of blood chemistry: Secondary | ICD-10-CM | POA: Diagnosis not present

## 2020-04-07 DIAGNOSIS — I5041 Acute combined systolic (congestive) and diastolic (congestive) heart failure: Secondary | ICD-10-CM | POA: Diagnosis not present

## 2020-04-07 DIAGNOSIS — F419 Anxiety disorder, unspecified: Secondary | ICD-10-CM | POA: Diagnosis present

## 2020-04-07 DIAGNOSIS — Z880 Allergy status to penicillin: Secondary | ICD-10-CM

## 2020-04-07 DIAGNOSIS — C3491 Malignant neoplasm of unspecified part of right bronchus or lung: Secondary | ICD-10-CM | POA: Diagnosis not present

## 2020-04-07 DIAGNOSIS — Z79890 Hormone replacement therapy (postmenopausal): Secondary | ICD-10-CM

## 2020-04-07 DIAGNOSIS — D649 Anemia, unspecified: Secondary | ICD-10-CM

## 2020-04-07 DIAGNOSIS — R0789 Other chest pain: Secondary | ICD-10-CM | POA: Diagnosis not present

## 2020-04-07 DIAGNOSIS — M19042 Primary osteoarthritis, left hand: Secondary | ICD-10-CM | POA: Diagnosis present

## 2020-04-07 DIAGNOSIS — I2510 Atherosclerotic heart disease of native coronary artery without angina pectoris: Secondary | ICD-10-CM | POA: Diagnosis not present

## 2020-04-07 DIAGNOSIS — Z008 Encounter for other general examination: Secondary | ICD-10-CM

## 2020-04-07 DIAGNOSIS — I5043 Acute on chronic combined systolic (congestive) and diastolic (congestive) heart failure: Secondary | ICD-10-CM | POA: Diagnosis not present

## 2020-04-07 DIAGNOSIS — I214 Non-ST elevation (NSTEMI) myocardial infarction: Secondary | ICD-10-CM | POA: Diagnosis not present

## 2020-04-07 DIAGNOSIS — J9611 Chronic respiratory failure with hypoxia: Secondary | ICD-10-CM | POA: Diagnosis present

## 2020-04-07 DIAGNOSIS — K219 Gastro-esophageal reflux disease without esophagitis: Secondary | ICD-10-CM | POA: Diagnosis not present

## 2020-04-07 DIAGNOSIS — Z20822 Contact with and (suspected) exposure to covid-19: Secondary | ICD-10-CM | POA: Diagnosis not present

## 2020-04-07 DIAGNOSIS — E876 Hypokalemia: Secondary | ICD-10-CM | POA: Diagnosis present

## 2020-04-07 DIAGNOSIS — Z9841 Cataract extraction status, right eye: Secondary | ICD-10-CM

## 2020-04-07 DIAGNOSIS — Z79899 Other long term (current) drug therapy: Secondary | ICD-10-CM

## 2020-04-07 DIAGNOSIS — C3490 Malignant neoplasm of unspecified part of unspecified bronchus or lung: Secondary | ICD-10-CM | POA: Diagnosis present

## 2020-04-07 DIAGNOSIS — Z87891 Personal history of nicotine dependence: Secondary | ICD-10-CM

## 2020-04-07 DIAGNOSIS — M2550 Pain in unspecified joint: Secondary | ICD-10-CM | POA: Diagnosis not present

## 2020-04-07 DIAGNOSIS — Z743 Need for continuous supervision: Secondary | ICD-10-CM | POA: Diagnosis not present

## 2020-04-07 DIAGNOSIS — I213 ST elevation (STEMI) myocardial infarction of unspecified site: Secondary | ICD-10-CM | POA: Diagnosis not present

## 2020-04-07 DIAGNOSIS — M1611 Unilateral primary osteoarthritis, right hip: Secondary | ICD-10-CM | POA: Diagnosis not present

## 2020-04-07 DIAGNOSIS — J441 Chronic obstructive pulmonary disease with (acute) exacerbation: Secondary | ICD-10-CM | POA: Diagnosis present

## 2020-04-07 DIAGNOSIS — Z882 Allergy status to sulfonamides status: Secondary | ICD-10-CM

## 2020-04-07 DIAGNOSIS — Z85118 Personal history of other malignant neoplasm of bronchus and lung: Secondary | ICD-10-CM

## 2020-04-07 DIAGNOSIS — Z961 Presence of intraocular lens: Secondary | ICD-10-CM | POA: Diagnosis not present

## 2020-04-07 DIAGNOSIS — E039 Hypothyroidism, unspecified: Secondary | ICD-10-CM | POA: Diagnosis present

## 2020-04-07 DIAGNOSIS — R7401 Elevation of levels of liver transaminase levels: Secondary | ICD-10-CM

## 2020-04-07 DIAGNOSIS — R569 Unspecified convulsions: Secondary | ICD-10-CM

## 2020-04-07 DIAGNOSIS — K1120 Sialoadenitis, unspecified: Secondary | ICD-10-CM | POA: Diagnosis present

## 2020-04-07 DIAGNOSIS — Z66 Do not resuscitate: Secondary | ICD-10-CM | POA: Diagnosis present

## 2020-04-07 DIAGNOSIS — R112 Nausea with vomiting, unspecified: Secondary | ICD-10-CM | POA: Diagnosis present

## 2020-04-07 DIAGNOSIS — R1013 Epigastric pain: Secondary | ICD-10-CM | POA: Diagnosis not present

## 2020-04-07 DIAGNOSIS — R064 Hyperventilation: Secondary | ICD-10-CM | POA: Diagnosis not present

## 2020-04-07 DIAGNOSIS — N312 Flaccid neuropathic bladder, not elsewhere classified: Secondary | ICD-10-CM | POA: Diagnosis present

## 2020-04-07 DIAGNOSIS — E785 Hyperlipidemia, unspecified: Secondary | ICD-10-CM | POA: Diagnosis not present

## 2020-04-07 DIAGNOSIS — R0602 Shortness of breath: Secondary | ICD-10-CM | POA: Diagnosis not present

## 2020-04-07 DIAGNOSIS — R748 Abnormal levels of other serum enzymes: Secondary | ICD-10-CM

## 2020-04-07 DIAGNOSIS — R000 Tachycardia, unspecified: Secondary | ICD-10-CM | POA: Diagnosis not present

## 2020-04-07 DIAGNOSIS — J439 Emphysema, unspecified: Secondary | ICD-10-CM | POA: Diagnosis not present

## 2020-04-07 DIAGNOSIS — E871 Hypo-osmolality and hyponatremia: Secondary | ICD-10-CM | POA: Diagnosis present

## 2020-04-07 DIAGNOSIS — I5042 Chronic combined systolic (congestive) and diastolic (congestive) heart failure: Secondary | ICD-10-CM | POA: Diagnosis not present

## 2020-04-07 DIAGNOSIS — E739 Lactose intolerance, unspecified: Secondary | ICD-10-CM | POA: Diagnosis present

## 2020-04-07 DIAGNOSIS — R6889 Other general symptoms and signs: Secondary | ICD-10-CM | POA: Diagnosis not present

## 2020-04-07 DIAGNOSIS — Z902 Acquired absence of lung [part of]: Secondary | ICD-10-CM

## 2020-04-07 DIAGNOSIS — Z881 Allergy status to other antibiotic agents status: Secondary | ICD-10-CM

## 2020-04-07 DIAGNOSIS — J690 Pneumonitis due to inhalation of food and vomit: Secondary | ICD-10-CM | POA: Diagnosis present

## 2020-04-07 DIAGNOSIS — Z8249 Family history of ischemic heart disease and other diseases of the circulatory system: Secondary | ICD-10-CM

## 2020-04-07 DIAGNOSIS — I499 Cardiac arrhythmia, unspecified: Secondary | ICD-10-CM | POA: Diagnosis not present

## 2020-04-07 DIAGNOSIS — N816 Rectocele: Secondary | ICD-10-CM | POA: Diagnosis not present

## 2020-04-07 DIAGNOSIS — Z90710 Acquired absence of both cervix and uterus: Secondary | ICD-10-CM

## 2020-04-07 DIAGNOSIS — Z885 Allergy status to narcotic agent status: Secondary | ICD-10-CM

## 2020-04-07 DIAGNOSIS — I959 Hypotension, unspecified: Secondary | ICD-10-CM | POA: Diagnosis not present

## 2020-04-07 DIAGNOSIS — Z0189 Encounter for other specified special examinations: Secondary | ICD-10-CM

## 2020-04-07 DIAGNOSIS — Z9842 Cataract extraction status, left eye: Secondary | ICD-10-CM

## 2020-04-07 DIAGNOSIS — M479 Spondylosis, unspecified: Secondary | ICD-10-CM | POA: Diagnosis present

## 2020-04-07 DIAGNOSIS — I708 Atherosclerosis of other arteries: Secondary | ICD-10-CM | POA: Diagnosis not present

## 2020-04-07 LAB — HEPATIC FUNCTION PANEL
ALT: 34 U/L (ref 0–44)
AST: 98 U/L — ABNORMAL HIGH (ref 15–41)
Albumin: 2.5 g/dL — ABNORMAL LOW (ref 3.5–5.0)
Alkaline Phosphatase: 138 U/L — ABNORMAL HIGH (ref 38–126)
Bilirubin, Direct: 0.3 mg/dL — ABNORMAL HIGH (ref 0.0–0.2)
Indirect Bilirubin: 0.7 mg/dL (ref 0.3–0.9)
Total Bilirubin: 1 mg/dL (ref 0.3–1.2)
Total Protein: 5.8 g/dL — ABNORMAL LOW (ref 6.5–8.1)

## 2020-04-07 LAB — COMPREHENSIVE METABOLIC PANEL
ALT: 39 U/L (ref 0–44)
AST: 103 U/L — ABNORMAL HIGH (ref 15–41)
Albumin: 3 g/dL — ABNORMAL LOW (ref 3.5–5.0)
Alkaline Phosphatase: 163 U/L — ABNORMAL HIGH (ref 38–126)
Anion gap: 20 — ABNORMAL HIGH (ref 5–15)
BUN: 6 mg/dL — ABNORMAL LOW (ref 8–23)
CO2: 17 mmol/L — ABNORMAL LOW (ref 22–32)
Calcium: 8 mg/dL — ABNORMAL LOW (ref 8.9–10.3)
Chloride: 82 mmol/L — ABNORMAL LOW (ref 98–111)
Creatinine, Ser: 0.66 mg/dL (ref 0.44–1.00)
GFR, Estimated: >60 mL/min (ref >60)
Glucose, Bld: 177 mg/dL — ABNORMAL HIGH (ref 70–99)
Potassium: 2.7 mmol/L — CL (ref 3.5–5.1)
Sodium: 119 mmol/L — CL (ref 135–145)
Total Bilirubin: 1.1 mg/dL (ref 0.3–1.2)
Total Protein: 6.5 g/dL (ref 6.5–8.1)

## 2020-04-07 LAB — BLOOD GAS, ARTERIAL
Acid-base deficit: 6 mmol/L — ABNORMAL HIGH (ref 0.0–2.0)
Bicarbonate: 19 mmol/L — ABNORMAL LOW (ref 20.0–28.0)
Drawn by: 308601
O2 Saturation: 98.8 %
Patient temperature: 98.6
pCO2 arterial: 37.7 mmHg (ref 32.0–48.0)
pH, Arterial: 7.322 — ABNORMAL LOW (ref 7.350–7.450)
pO2, Arterial: 153 mmHg — ABNORMAL HIGH (ref 83.0–108.0)

## 2020-04-07 LAB — BASIC METABOLIC PANEL
Anion gap: 14 (ref 5–15)
Anion gap: 7 (ref 5–15)
Anion gap: 6 (ref 5–15)
BUN: <5 mg/dL — ABNORMAL LOW (ref 8–23)
BUN: <5 mg/dL — ABNORMAL LOW (ref 8–23)
BUN: <5 mg/dL — ABNORMAL LOW (ref 8–23)
CO2: 22 mmol/L (ref 22–32)
CO2: 23 mmol/L (ref 22–32)
CO2: 24 mmol/L (ref 22–32)
Calcium: 7.7 mg/dL — ABNORMAL LOW (ref 8.9–10.3)
Calcium: 6.9 mg/dL — ABNORMAL LOW (ref 8.9–10.3)
Calcium: 7.5 mg/dL — ABNORMAL LOW (ref 8.9–10.3)
Chloride: 83 mmol/L — ABNORMAL LOW (ref 98–111)
Chloride: 90 mmol/L — ABNORMAL LOW (ref 98–111)
Chloride: 89 mmol/L — ABNORMAL LOW (ref 98–111)
Creatinine, Ser: 0.65 mg/dL (ref 0.44–1.00)
Creatinine, Ser: 0.52 mg/dL (ref 0.44–1.00)
Creatinine, Ser: 0.6 mg/dL (ref 0.44–1.00)
GFR, Estimated: >60 mL/min (ref >60)
GFR, Estimated: >60 mL/min (ref >60)
GFR, Estimated: >60 mL/min (ref >60)
Glucose, Bld: 116 mg/dL — ABNORMAL HIGH (ref 70–99)
Glucose, Bld: 97 mg/dL (ref 70–99)
Glucose, Bld: 103 mg/dL — ABNORMAL HIGH (ref 70–99)
Potassium: 3 mmol/L — ABNORMAL LOW (ref 3.5–5.1)
Potassium: 3.5 mmol/L (ref 3.5–5.1)
Potassium: 3.8 mmol/L (ref 3.5–5.1)
Sodium: 119 mmol/L — CL (ref 135–145)
Sodium: 120 mmol/L — ABNORMAL LOW (ref 135–145)
Sodium: 119 mmol/L — CL (ref 135–145)

## 2020-04-07 LAB — RAPID URINE DRUG SCREEN, HOSP PERFORMED
Amphetamines: NOT DETECTED
Barbiturates: NOT DETECTED
Benzodiazepines: POSITIVE — AB
Cocaine: NOT DETECTED
Opiates: NOT DETECTED
Tetrahydrocannabinol: NOT DETECTED

## 2020-04-07 LAB — HEPARIN LEVEL (UNFRACTIONATED)
Heparin Unfractionated: 0.54 IU/mL (ref 0.30–0.70)
Heparin Unfractionated: 0.55 IU/mL (ref 0.30–0.70)

## 2020-04-07 LAB — CBC
HCT: 32.1 % — ABNORMAL LOW (ref 36.0–46.0)
Hemoglobin: 10.3 g/dL — ABNORMAL LOW (ref 12.0–15.0)
MCH: 31.8 pg (ref 26.0–34.0)
MCHC: 32.1 g/dL (ref 30.0–36.0)
MCV: 99.1 fL (ref 80.0–100.0)
Platelets: 188 10*3/uL (ref 150–400)
RBC: 3.24 MIL/uL — ABNORMAL LOW (ref 3.87–5.11)
RDW: 12.5 % (ref 11.5–15.5)
WBC: 13.1 10*3/uL — ABNORMAL HIGH (ref 4.0–10.5)
nRBC: 0 % (ref 0.0–0.2)

## 2020-04-07 LAB — ECHOCARDIOGRAM COMPLETE
Area-P 1/2: 4.06 cm2
Calc EF: 48.5 %
Height: 66 in
S' Lateral: 3.5 cm
Single Plane A2C EF: 52.7 %
Single Plane A4C EF: 45.8 %
Weight: 2130.53 oz

## 2020-04-07 LAB — TSH: TSH: 10.577 u[IU]/mL — ABNORMAL HIGH (ref 0.350–4.500)

## 2020-04-07 LAB — OSMOLALITY: Osmolality: 256 mOsm/kg — ABNORMAL LOW (ref 275–295)

## 2020-04-07 LAB — TROPONIN I (HIGH SENSITIVITY)
Troponin I (High Sensitivity): 3265 ng/L (ref <18)
Troponin I (High Sensitivity): 3454 ng/L (ref <18)
Troponin I (High Sensitivity): 2165 ng/L (ref <18)

## 2020-04-07 LAB — MRSA PCR SCREENING: MRSA by PCR: NEGATIVE

## 2020-04-07 LAB — LACTIC ACID, PLASMA
Lactic Acid, Venous: 3.6 mmol/L (ref 0.5–1.9)
Lactic Acid, Venous: 3 mmol/L (ref 0.5–1.9)

## 2020-04-07 LAB — CBG MONITORING, ED: Glucose-Capillary: 165 mg/dL — ABNORMAL HIGH (ref 70–99)

## 2020-04-07 LAB — OSMOLALITY, URINE: Osmolality, Ur: 511 mOsm/kg (ref 300–900)

## 2020-04-07 LAB — PROTIME-INR
INR: 1.2 (ref 0.8–1.2)
Prothrombin Time: 14.9 seconds (ref 11.4–15.2)

## 2020-04-07 LAB — BRAIN NATRIURETIC PEPTIDE: B Natriuretic Peptide: 404.1 pg/mL — ABNORMAL HIGH (ref 0.0–100.0)

## 2020-04-07 LAB — MAGNESIUM
Magnesium: 1.3 mg/dL — ABNORMAL LOW (ref 1.7–2.4)
Magnesium: 2 mg/dL (ref 1.7–2.4)

## 2020-04-07 LAB — SODIUM, URINE, RANDOM: Sodium, Ur: 91 mmol/L

## 2020-04-07 LAB — APTT: aPTT: 33 seconds (ref 24–36)

## 2020-04-07 LAB — RESPIRATORY PANEL BY RT PCR (FLU A&B, COVID)
Influenza A by PCR: NEGATIVE
Influenza B by PCR: NEGATIVE
SARS Coronavirus 2 by RT PCR: NEGATIVE

## 2020-04-07 LAB — ETHANOL: Alcohol, Ethyl (B): <10 mg/dL (ref <10)

## 2020-04-07 LAB — LIPASE, BLOOD: Lipase: 23 U/L (ref 11–51)

## 2020-04-07 LAB — D-DIMER, QUANTITATIVE: D-Dimer, Quant: 2.91 ug/mL-FEU — ABNORMAL HIGH (ref 0.00–0.50)

## 2020-04-07 MED ORDER — SODIUM CHLORIDE 0.9 % IV SOLN: INTRAVENOUS | Status: DC

## 2020-04-07 MED ORDER — MORPHINE SULFATE (PF) 2 MG/ML IV SOLN
1.0000 mg | INTRAVENOUS | Status: DC | PRN
  Administered 2020-04-09: 1 mg via INTRAVENOUS
  Filled 2020-04-07: qty 1

## 2020-04-07 MED ORDER — LEVALBUTEROL HCL 1.25 MG/0.5ML IN NEBU
1.2500 mg | INHALATION_SOLUTION | Freq: Three times a day (TID) | RESPIRATORY_TRACT | Status: DC
  Administered 2020-04-07 – 2020-04-16 (×25): 1.25 mg via RESPIRATORY_TRACT
  Filled 2020-04-07 (×24): qty 0.5

## 2020-04-07 MED ORDER — SODIUM CHLORIDE 0.9 % IV BOLUS
1000.0000 mL | Freq: Once | INTRAVENOUS | Status: AC
  Administered 2020-04-07: 1000 mL via INTRAVENOUS

## 2020-04-07 MED ORDER — ADULT MULTIVITAMIN W/MINERALS CH
1.0000 | ORAL_TABLET | Freq: Every day | ORAL | Status: DC
  Administered 2020-04-08 – 2020-04-13 (×6): 1 via ORAL
  Filled 2020-04-07 (×7): qty 1

## 2020-04-07 MED ORDER — ORAL CARE MOUTH RINSE
15.0000 mL | Freq: Two times a day (BID) | OROMUCOSAL | Status: DC
  Administered 2020-04-07 – 2020-04-17 (×19): 15 mL via OROMUCOSAL

## 2020-04-07 MED ORDER — IOHEXOL 350 MG/ML SOLN
100.0000 mL | Freq: Once | INTRAVENOUS | Status: AC | PRN
  Administered 2020-04-07: 100 mL via INTRAVENOUS

## 2020-04-07 MED ORDER — LORAZEPAM 2 MG/ML IJ SOLN
1.0000 mg | INTRAMUSCULAR | Status: DC | PRN
  Administered 2020-04-08: 2 mg via INTRAVENOUS
  Filled 2020-04-07: qty 1

## 2020-04-07 MED ORDER — HEPARIN (PORCINE) 25000 UT/250ML-% IV SOLN
700.0000 [IU]/h | INTRAVENOUS | Status: DC
  Administered 2020-04-07 – 2020-04-08 (×2): 700 [IU]/h via INTRAVENOUS
  Filled 2020-04-07 (×2): qty 250

## 2020-04-07 MED ORDER — ROSUVASTATIN CALCIUM 20 MG PO TABS
20.0000 mg | ORAL_TABLET | Freq: Every day | ORAL | Status: DC
  Administered 2020-04-07 – 2020-04-17 (×11): 20 mg via ORAL
  Filled 2020-04-07 (×11): qty 1

## 2020-04-07 MED ORDER — THIAMINE HCL 100 MG PO TABS
100.0000 mg | ORAL_TABLET | Freq: Every day | ORAL | Status: DC
  Administered 2020-04-08 – 2020-04-13 (×6): 100 mg via ORAL
  Filled 2020-04-07 (×8): qty 1

## 2020-04-07 MED ORDER — LEVALBUTEROL HCL 1.25 MG/0.5ML IN NEBU
1.2500 mg | INHALATION_SOLUTION | Freq: Four times a day (QID) | RESPIRATORY_TRACT | Status: DC
  Administered 2020-04-07 (×3): 1.25 mg via RESPIRATORY_TRACT
  Filled 2020-04-07 (×3): qty 0.5

## 2020-04-07 MED ORDER — SODIUM CHLORIDE 0.9 % IV SOLN
1.0000 g | INTRAVENOUS | Status: DC
  Administered 2020-04-07 – 2020-04-13 (×7): 1 g via INTRAVENOUS
  Filled 2020-04-07: qty 10
  Filled 2020-04-07: qty 1
  Filled 2020-04-07 (×2): qty 10
  Filled 2020-04-07 (×2): qty 1
  Filled 2020-04-07: qty 10
  Filled 2020-04-07: qty 1

## 2020-04-07 MED ORDER — FOLIC ACID 1 MG PO TABS
1.0000 mg | ORAL_TABLET | Freq: Every day | ORAL | Status: DC
  Administered 2020-04-08 – 2020-04-17 (×10): 1 mg via ORAL
  Filled 2020-04-07 (×11): qty 1

## 2020-04-07 MED ORDER — ONDANSETRON HCL 4 MG/2ML IJ SOLN
4.0000 mg | Freq: Once | INTRAMUSCULAR | Status: AC
  Administered 2020-04-07: 4 mg via INTRAVENOUS
  Filled 2020-04-07: qty 2

## 2020-04-07 MED ORDER — LEVOTHYROXINE SODIUM 100 MCG/5ML IV SOLN
44.0000 ug | Freq: Every day | INTRAVENOUS | Status: DC
  Administered 2020-04-07: 44 ug via INTRAVENOUS
  Filled 2020-04-07: qty 5

## 2020-04-07 MED ORDER — ASPIRIN 300 MG RE SUPP
300.0000 mg | Freq: Every day | RECTAL | Status: DC
  Administered 2020-04-07: 300 mg via RECTAL
  Filled 2020-04-07: qty 1

## 2020-04-07 MED ORDER — CHLORHEXIDINE GLUCONATE CLOTH 2 % EX PADS
6.0000 | MEDICATED_PAD | Freq: Every day | CUTANEOUS | Status: DC
  Administered 2020-04-07 – 2020-04-11 (×5): 6 via TOPICAL

## 2020-04-07 MED ORDER — POTASSIUM CHLORIDE 10 MEQ/100ML IV SOLN
10.0000 meq | INTRAVENOUS | Status: AC
  Administered 2020-04-07 (×4): 10 meq via INTRAVENOUS
  Filled 2020-04-07 (×4): qty 100

## 2020-04-07 MED ORDER — LORAZEPAM 2 MG/ML IJ SOLN
INTRAMUSCULAR | Status: AC
  Administered 2020-04-07: 2 mg
  Filled 2020-04-07: qty 1

## 2020-04-07 MED ORDER — SODIUM CHLORIDE (PF) 0.9 % IJ SOLN
INTRAMUSCULAR | Status: AC
  Filled 2020-04-07: qty 50

## 2020-04-07 MED ORDER — PERFLUTREN LIPID MICROSPHERE
1.0000 mL | INTRAVENOUS | Status: AC | PRN
  Administered 2020-04-07: 2 mL via INTRAVENOUS
  Filled 2020-04-07: qty 10

## 2020-04-07 MED ORDER — LEVETIRACETAM IN NACL 1000 MG/100ML IV SOLN
1000.0000 mg | Freq: Once | INTRAVENOUS | Status: AC
  Administered 2020-04-07: 1000 mg via INTRAVENOUS
  Filled 2020-04-07: qty 100

## 2020-04-07 MED ORDER — THIAMINE HCL 100 MG/ML IJ SOLN
100.0000 mg | Freq: Every day | INTRAMUSCULAR | Status: DC
  Administered 2020-04-07: 100 mg via INTRAVENOUS
  Filled 2020-04-07 (×2): qty 2

## 2020-04-07 MED ORDER — ASPIRIN 300 MG RE SUPP
300.0000 mg | Freq: Once | RECTAL | Status: AC
  Administered 2020-04-07: 300 mg via RECTAL
  Filled 2020-04-07: qty 1

## 2020-04-07 MED ORDER — LORAZEPAM 1 MG PO TABS
1.0000 mg | ORAL_TABLET | ORAL | Status: DC | PRN
  Administered 2020-04-07: 2 mg via ORAL
  Administered 2020-04-07 – 2020-04-09 (×2): 1 mg via ORAL
  Administered 2020-04-09: 2 mg via ORAL
  Filled 2020-04-07: qty 2
  Filled 2020-04-07 (×2): qty 1
  Filled 2020-04-07: qty 2

## 2020-04-07 MED ORDER — LEVOTHYROXINE SODIUM 100 MCG PO TABS
100.0000 ug | ORAL_TABLET | Freq: Every day | ORAL | Status: DC
  Administered 2020-04-08 – 2020-04-17 (×8): 100 ug via ORAL
  Filled 2020-04-07 (×8): qty 1

## 2020-04-07 MED ORDER — LORAZEPAM 2 MG/ML IJ SOLN: 0.0000 mg | Freq: Two times a day (BID) | INTRAMUSCULAR | Status: DC

## 2020-04-07 MED ORDER — MAGNESIUM SULFATE 2 GM/50ML IV SOLN
2.0000 g | Freq: Once | INTRAVENOUS | Status: AC
  Administered 2020-04-07: 2 g via INTRAVENOUS
  Filled 2020-04-07: qty 50

## 2020-04-07 MED ORDER — LEVALBUTEROL HCL 0.63 MG/3ML IN NEBU
0.6300 mg | INHALATION_SOLUTION | RESPIRATORY_TRACT | Status: DC | PRN
  Administered 2020-04-08 – 2020-04-09 (×3): 0.63 mg via RESPIRATORY_TRACT
  Filled 2020-04-07 (×3): qty 3

## 2020-04-07 MED ORDER — LORAZEPAM 2 MG/ML IJ SOLN: 0.0000 mg | Freq: Four times a day (QID) | INTRAMUSCULAR | Status: DC

## 2020-04-07 MED ORDER — HEPARIN BOLUS VIA INFUSION
3000.0000 [IU] | Freq: Once | INTRAVENOUS | Status: AC
  Administered 2020-04-07: 3000 [IU] via INTRAVENOUS
  Filled 2020-04-07: qty 3000

## 2020-04-07 MED ORDER — ASPIRIN 81 MG PO CHEW
81.0000 mg | CHEWABLE_TABLET | Freq: Every day | ORAL | Status: DC
  Administered 2020-04-08 – 2020-04-17 (×10): 81 mg via ORAL
  Filled 2020-04-07 (×10): qty 1

## 2020-04-07 NOTE — ED Notes (Signed)
Date and time results2 received: 04/07/20 0422 (use smartphrase ".now" to insert current time)  Test: Sodium  Critical Value: 119  Name of Provider Notified: Roxanne Mins, MD   Orders Received? Or Actions Taken?: Orders Received - See Orders for details

## 2020-04-07 NOTE — ED Triage Notes (Addendum)
Patient is complaining of nausea and vomiting starting a few hours ago. Patient took pepto bismol with no relief. 3 L [place] -baseline. Patient O2 sat-99%. Patient is also complaining of not being able to catch her breath. Patient drinks wine on a daily bases and was able to tonight due to vomiting.

## 2020-04-07 NOTE — Progress Notes (Signed)
[facility] for heparin Indication: ACS/STEMI  Allergies  Allergen Reactions  . Ampicillin Diarrhea and Nausea Only    GI upset  . Azithromycin Diarrhea and Nausea And Vomiting  . Codeine Nausea And Vomiting and Other (See Comments)    Makes her "crazy"  . Daliresp [Roflumilast] Nausea Only  . Nitrofurantoin Diarrhea and Nausea Only  . Other Other (See Comments)    No nuts and seeds because of diverticulitis  . Penicillins Nausea And Vomiting, Swelling and Rash    Has patient had a PCN reaction causing immediate rash, facial/tongue/throat swelling, SOB or lightheadedness with hypotension: Yes Has patient had a PCN reaction causing severe rash involving mucus membranes or skin necrosis: No Has patient had a PCN reaction that required hospitalization No Has patient had a PCN reaction occurring within the last 10 years: No No "cillins" If all of the above answers are "NO", then may proceed with Cephalosporin use.   . Sulfonamide Derivatives Diarrhea and Nausea And Vomiting  . Tiotropium Bromide Monohydrate     Urinary retention  . Cephalosporins Nausea And Vomiting    IV seems to be ok  . Doxycycline Nausea Only  . Protonix [Pantoprazole] Nausea Only  . Fludrocortisone Acetate Itching  . Lactose Intolerance (Gi) Other (See Comments)    Gas and bloating   . Latex Itching and Rash  . Prednisone Other (See Comments)    Has tolerated Prednisone per daughter    Patient Measurements: Height: 5\' 6"  (167.6 cm) Weight: 60.4 kg (133 lb 2.5 oz) IBW/kg (Calculated) : 59.3 Heparin Dosing Weight: 60.4kg  Vital Signs: Temp: 98.8 F (37.1 C) (10/19 0248) Temp Source: Oral (10/19 0248) BP: 139/79 (10/19 1400) Pulse Rate: 94 (10/19 1400)  Labs: Recent Labs    04/07/20 0306 04/07/20 0319 04/07/20 0527 04/07/20 1047 04/07/20 1322  HGB 10.3*  --   --   --   --   HCT 32.1*  --   --   --   --   PLT 188  --   --   --   --   APTT  --   --  33  --    --   LABPROT  --   --  14.9  --   --   INR  --   --  1.2  --   --   HEPARINUNFRC  --   --   --   --  0.54  CREATININE 0.66  --  0.65 0.52  --   TROPONINIHS  --  3,265* 3,454* 2,165*  --     Estimated Creatinine Clearance: 49 mL/min (by C-G formula based on SCr of 0.52 mg/dL).   Assessment: 86 yo female came in c/o N/V, dyspnea.  Pt had a generalized seizure while in the x-ray dept.  Pharmacy consulted to dose heparin for ACS/ STEMI.  No prior AC noted  Today, 04/07/2020:  CBC WNL  First heparin level therapeutic on 700 units/hr  SCr stable WNL - at baseline  No bleeding or infusion issues per RN  Cards deciding on utility of LHC  Goal of Therapy:  Heparin level 0.3-0.7 units/ml Monitor platelets by anticoagulation protocol   Plan:   Continue heparin infusion at 700 units/hr  Repeat confirmatory heparin level in 8 hr  Daily CBC and heparin level  Monitor for s/s bleeding or thrombosis  Reuel Boom, PharmD, BCPS [phone] 04/07/2020, 2:17 PM

## 2020-04-07 NOTE — ED Notes (Signed)
Patient was in xray and started having a seizure. Patient was jerking in the chair. MD notified.

## 2020-04-07 NOTE — ED Notes (Signed)
ECHO at bedside.

## 2020-04-07 NOTE — ED Provider Notes (Signed)
[facility] DEPT Provider Note   CSN: [id number] Arrival date & time: 04/07/20  9702   History Chief Complaint  Patient presents with  . Emesis  . Shortness of Breath    [name] is a 86 y.o. female.  History provided by: Nursing notes. The history is limited by the condition of the patient (Post ictal).  Emesis Shortness of Breath Associated symptoms: vomiting   Per triage assessment, she came in complaining of nausea and vomiting that started this evening, no relief with Pepto-Bismol.  Also, complained of dyspnea.  Initial orders were placed at triage and she was sent for chest x-ray.  She had a generalized seizure while in the x-ray department, witnessed by radiology technician.  She was given a dose of lorazepam and moved to a patient care room.  Past Medical History:  Diagnosis Date  . Allergic rhinitis   . Anxiety   . Arthritis    "back, hands; hips" (12/22/2014)  . Asthma   . Bladder atony   . Chronic bronchitis ([place])   . COPD (chronic obstructive pulmonary disease) ([place])   . Diarrhea   . Emphysema of lung ([place])   . GERD (gastroesophageal reflux disease)    "w/spicey foods" (12/22/2014)  . Hyperlipidemia   . Non-small cell carcinoma of lung, stage 1 ([place]) 2005   1.4 cm Poorly differentiated Squamous cell RUL  T1N0 resected 09/22/2003  . On home oxygen therapy    "3L q night and prn during the day" (12/22/2014)  . Pelvic cyst   . Pneumonia    "this is the 3rd time that I can remember" (12/22/2014)  . Unspecified hypothyroidism     Patient Active Problem List   Diagnosis Date Noted  . Sialadenitis 03/26/2020  . Arm wound, left, initial encounter 01/15/2020  . Open knee wound, right, initial encounter 01/15/2020  . Diarrhea 01/01/2020  . Blood in stool 01/01/2020  . Left lower quadrant abdominal pain 01/01/2020  . Dark stools 11/05/2019  . GERD (gastroesophageal reflux disease) 11/05/2019  . Hypomagnesemia 10/14/2019  . Skin  tear of left upper arm without complication [id number]/5885  . COPD with acute exacerbation ([place]) 05/22/2019  . Urinary incontinence due to immobility 05/22/2019  . Non-healing wound of lower extremity 05/03/2019  . Multiple skin tears 03/02/2019  . Weight loss 11/20/2018  . Depression 11/20/2018  . Recurrent falls 11/20/2018  . Parotiditis 09/17/2018  . Primary osteoarthritis of right hip 06/21/2018  . Degenerative disc disease, cervical 12/06/2017  . DOE (dyspnea on exertion) 11/21/2017  . Bilateral swelling of feet 11/02/2017  . Osteopenia 02/16/2017  . Hyperglycemia 12/08/2016  . Degenerative arthritis of right knee 10/27/2016  . 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 Greater trochanteric bursitis of both hips 04/08/2016  . Pain in both knees 04/08/2016  . Chronic midline low back pain without sciatica 04/08/2016  . Chronic respiratory failure with hypoxia ([place]) 03/07/2015  . Diverticulitis 05/24/2014  . Anxiety   . Bladder prolapse, female, acquired   . Non-small cell carcinoma of lung, stage 1 ([place]) 01/10/2012  . Neuropathy, peripheral 01/28/2011  . COPD mixed type ([place]) 09/08/2010  . Dyslipidemia   . ATONY OF BLADDER 08/10/2009  . Hypothyroidism 03/12/2008  . Seasonal allergic rhinitis 10/10/2007    Past Surgical History:  Procedure Laterality Date  . ABDOMINAL HYSTERECTOMY  ~ 1976  . BACK SURGERY    . CATARACT EXTRACTION W/ INTRAOCULAR LENS  IMPLANT, BILATERAL Bilateral ~ 2012  .  DILATION AND CURETTAGE OF UTERUS    . LUMBAR DISC SURGERY  1970's  . LYMPH NODE DISSECTION  2005   "all my lymph nodes under breasts, went thru my back; Dr. Arlyce Dice did it"  . Needle aspiration Pelvic cyst  2011   Dr Diona Fanti  . VIDEO ASSISTED THORACOSCOPY (VATS)/THOROCOTOMY Right 09/2003   thoracotomy, right upper lobectomy with node dissection; Dr Demetrios Loll 11/02/2010  . VIDEO BRONCHOSCOPY  03/2004   Archie Endo 11/02/2010     OB History   No obstetric  history on file.     Family History  Problem Relation Age of Onset  . Heart disease Father   . Rheumatologic disease Brother   . Cancer Brother        Leukemia  . Rheumatologic disease Sister   . Breast cancer Daughter     Social History   Tobacco Use  . Smoking status: Former Smoker    Packs/day: 1.00    Years: 42.00    Pack years: 42.00    Types: Cigarettes    Quit date: 09/22/2003    Years since quitting: 16.5  . Smokeless tobacco: Never Used  Vaping Use  . Vaping Use: Never used  Substance Use Topics  . Alcohol use: Yes    Alcohol/week: 14.0 standard drinks    Types: 14 Glasses of wine per week    Comment: 12/22/2014 "I have a large glass of wine qd"  . Drug use: No    Home Medications Prior to Admission medications   Medication Sig Start Date End Date Taking? Authorizing Provider  ALPRAZolam Duanne Moron) 1 MG tablet TAKE 1 TABLET BY MOUTH EVERYDAY AT BEDTIME 03/09/20   Burns, Claudina Lick, MD  atorvastatin (LIPITOR) 20 MG tablet TAKE 1 TABLET (20 MG TOTAL) BY MOUTH DAILY AT 6 PM. Patient taking differently: Take 20 mg by mouth See admin instructions. Takes on Mon, Wed, and Fri 05/20/19   Binnie Rail, MD  cefdinir (OMNICEF) 250 MG/5ML suspension Take 6 mLs (300 mg total) by mouth 2 (two) times daily. 03/26/20   Janith Lima, MD  Cholecalciferol (VITAMIN D) 2000 UNITS tablet Take 1 tablet (2,000 Units total) by mouth daily. 04/29/15   Rowe Clack, MD  FLOVENT HFA 110 MCG/ACT inhaler TAKE 2 PUFFS BY MOUTH TWICE A DAY 01/21/20   Young, Tarri Fuller D, MD  levalbuterol Penne Lash) 1.25 MG/3ML nebulizer solution Take 1.25 mg by nebulization every 6 (six) hours as needed for wheezing. DX: Bronchiectasis 09/27/19   Baird Lyons D, MD  levothyroxine (SYNTHROID) 88 MCG tablet TAKE 1 TABLET BY MOUTH EVERY DAY 03/23/20   Binnie Rail, MD  Multiple Vitamins-Minerals (CENTRUM SILVER PO) Take 1 tablet by mouth daily.     [provider]  ondansetron (ZOFRAN) 4 MG tablet Take 1  tablet (4 mg total) by mouth 3 (three) times daily as needed for nausea or vomiting. 03/26/20   Janith Lima, MD  pantoprazole (PROTONIX) 40 MG tablet Take 40 mg by mouth 2 (two) times daily.  02/01/20   [provider]  Probiotic Product (PROBIOTIC PO) Take 1 capsule by mouth daily.    [provider]  Respiratory Therapy Supplies (FLUTTER) DEVI Blow through 4 times per cycle and repeat 3 cycles per day 10/15/13   Baird Lyons D, MD    Allergies    Ampicillin, Azithromycin, Codeine, Daliresp [roflumilast], Nitrofurantoin, Other, Penicillins, Sulfonamide derivatives, Tiotropium bromide monohydrate, Cephalosporins, Doxycycline, Protonix [pantoprazole], Fludrocortisone acetate, Lactose intolerance (gi), Latex, and Prednisone  Review of  Systems   Review of Systems  Unable to perform ROS: Mental status change  Respiratory: Positive for shortness of breath.   Gastrointestinal: Positive for vomiting.    Physical Exam Updated Vital Signs BP (!) 161/101 (BP Location: Right Arm)   Pulse (!) 112   Temp 98.8 F (37.1 C) (Oral)   Resp 18   Ht 5\' 6"  (1.676 m)   Wt 60.4 kg   SpO2 96%   BMI 21.49 kg/m   Physical Exam Vitals and nursing note reviewed.   86 year old female, resting comfortably and in no acute distress. Vital signs are significant for elevated heart rate and blood pressure. Oxygen saturation is 96%, which is normal. Head is normocephalic and atraumatic.  Pupils are 3 mm and sluggishly reactive.  Eyes deviate to the left with the right eye deviating more than the left eye.  Eyes will not cross the midline to the right. Neck is nontender and supple without adenopathy or JVD. Back is nontender and there is no CVA tenderness. Lungs are clear without rales, wheezes, or rhonchi. Chest is nontender. Heart has regular rate and rhythm without murmur. Abdomen is soft, flat, nontender without masses or hepatosplenomegaly and peristalsis is normoactive. Extremities have  no cyanosis or edema. Skin is warm and dry without rash. Neurologic: Responds to painful stimuli with groaning but no purposeful movement.  Cranial nerves intact with exception of eye deviation noted above.  Motor tone is greater on the left than the right.  Plus/minus right Babinski reflex present.  ED Results / Procedures / Treatments   Labs (all labs ordered are listed, but only abnormal results are displayed) Labs Reviewed  COMPREHENSIVE METABOLIC PANEL - Abnormal; Notable for the following components:      Result Value   Sodium 119 (*)    Potassium 2.7 (*)    Chloride 82 (*)    CO2 17 (*)    Glucose, Bld 177 (*)    BUN 6 (*)    Calcium 8.0 (*)    Albumin 3.0 (*)    AST 103 (*)    Alkaline Phosphatase 163 (*)    Anion gap 20 (*)    All other components within normal limits  CBC - Abnormal; Notable for the following components:   WBC 13.1 (*)    RBC 3.24 (*)    Hemoglobin 10.3 (*)    HCT 32.1 (*)    All other components within normal limits  BRAIN NATRIURETIC PEPTIDE - Abnormal; Notable for the following components:   B Natriuretic Peptide 404.1 (*)    All other components within normal limits  BLOOD GAS, ARTERIAL - Abnormal; Notable for the following components:   pH, Arterial 7.322 (*)    pO2, Arterial 153 (*)    Bicarbonate 19.0 (*)    Acid-base deficit 6.0 (*)    All other components within normal limits  D-DIMER, QUANTITATIVE (NOT AT [facility]) - Abnormal; Notable for the following components:   D-Dimer, Quant 2.91 (*)    All other components within normal limits  MAGNESIUM - Abnormal; Notable for the following components:   Magnesium 1.3 (*)    All other components within normal limits  BASIC METABOLIC PANEL - Abnormal; Notable for the following components:   Sodium 119 (*)    Potassium 3.0 (*)    Chloride 83 (*)    Glucose, Bld 116 (*)    BUN <5 (*)    Calcium 7.7 (*)    All other components within normal  limits  HEPATIC FUNCTION PANEL - Abnormal; Notable for  the following components:   Total Protein 5.8 (*)    Albumin 2.5 (*)    AST 98 (*)    Alkaline Phosphatase 138 (*)    Bilirubin, Direct 0.3 (*)    All other components within normal limits  CBG MONITORING, ED - Abnormal; Notable for the following components:   Glucose-Capillary 165 (*)    All other components within normal limits  TROPONIN I (HIGH SENSITIVITY) - Abnormal; Notable for the following components:   Troponin I (High Sensitivity) 3,265 (*)    All other components within normal limits  TROPONIN I (HIGH SENSITIVITY) - Abnormal; Notable for the following components:   Troponin I (High Sensitivity) 3,454 (*)    All other components within normal limits  RESPIRATORY PANEL BY RT PCR (FLU A&B, COVID)  ETHANOL  APTT  PROTIME-INR  LIPASE, BLOOD  RAPID URINE DRUG SCREEN, HOSP PERFORMED  HEPARIN LEVEL (UNFRACTIONATED)  BASIC METABOLIC PANEL  BASIC METABOLIC PANEL  TSH  LACTIC ACID, PLASMA  LACTIC ACID, PLASMA  SODIUM, URINE, RANDOM  OSMOLALITY, URINE  TROPONIN I (HIGH SENSITIVITY)    EKG EKG Interpretation  Date/Time:  Tuesday April 07 2020 02:47:39 EDT Ventricular Rate:  114 PR Interval:    QRS Duration: 103 QT Interval:  354 QTC Calculation: 488 R Axis:   73 Text Interpretation: Sinus tachycardia Premature atrial complexes Borderline repolarization abnormality Borderline prolonged QT interval Artifact in lead(s) I II III aVR aVL aVF When compared with ECG of 11/26/2019, Artifact is now present QT has lengthened Confirmed by Delora Fuel ([id number]) on 04/07/2020 3:07:51 AM   EKG Interpretation  Date/Time:  Tuesday April 07 2020 05:25:31 EDT Ventricular Rate:  107 PR Interval:    QRS Duration: 94 QT Interval:  370 QTC Calculation: 494 R Axis:   78 Text Interpretation: Sinus tachycardia with irregular rate Borderline prolonged QT interval When compared with ECG of EARLIER SAME DATE No significant change was found Confirmed by Delora Fuel ([id number]) on 04/07/2020  5:42:15 AM       Radiology DG Chest 2 View  Result Date: 04/07/2020 CLINICAL DATA:  86 year old female with shortness of breath.  COPD. EXAM: CHEST - 2 VIEW COMPARISON:  03/12/2020 chest radiographs and earlier. FINDINGS: Upright AP and lateral views. 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 Right lung base calcified pleural plaques demonstrated by CT in 2018. Mediastinal contours are stable. No pneumothorax. But there is increased reticulonodular opacity in the right lower lung compared to March of this year. Left lung appears stable. Calcified aortic atherosclerosis. Osteopenia. No acute osseous abnormality identified. Paucity of bowel gas in the upper abdomen. IMPRESSION: 1. Chronic lung disease with increased reticulonodular opacity in the right lower lung compatible with acute infectious exacerbation. 2. Aortic Atherosclerosis (ICD10-I70.0) and Emphysema (ICD10-J43.9). Electronically Signed   By: Genevie Ann M.D.   On: 04/07/2020 03:05   CT Head Wo Contrast  Result Date: 04/07/2020 CLINICAL DATA:  Nontraumatic seizure EXAM: CT HEAD WITHOUT CONTRAST TECHNIQUE: Contiguous axial images were obtained from the base of the skull through the vertex without intravenous contrast. COMPARISON:  01/08/2020 FINDINGS: Brain: No evidence of acute infarction, hemorrhage, hydrocephalus, extra-axial collection or mass lesion/mass effect. Generalized atrophy with chronic small vessel ischemic low-density in the deep white matter. Vascular: No hyperdense vessel or unexpected calcification. Skull: Normal. Negative for fracture or focal lesion. Sinuses/Orbits: No acute finding.  Bilateral cataract resection. Other: Motion degraded. IMPRESSION: 1. Aging  brain without acute superimposed finding. 2. Intermittent motion artifact. Electronically Signed   By: Monte Fantasia M.D.   On: 04/07/2020 04:23    Procedures Procedures  CRITICAL CARE Performed by: Delora Fuel Total critical care time: 160 minutes Critical care time was exclusive of separately billable procedures and treating other patients. Critical care was necessary to treat or prevent imminent or life-threatening deterioration. Critical care was time spent personally by me on the following activities: development of treatment plan with patient and/or surrogate as well as nursing, discussions with consultants, evaluation of patient's response to treatment, examination of patient, obtaining history from patient or surrogate, ordering and performing treatments and interventions, ordering and review of laboratory studies, ordering and review of radiographic studies, pulse oximetry and re-evaluation of patient's condition.  Medications Ordered in ED Medications  potassium chloride 10 mEq in 100 mL IVPB (0 mEq Intravenous Stopped 04/07/20 0645)  heparin ADULT infusion 100 units/mL (25000 units/238mL sodium chloride 0.45%) (700 Units/hr Intravenous New Bag/Given 04/07/20 0530)  morphine 2 MG/ML injection 1 mg (has no administration in time range)  aspirin suppository 300 mg (has no administration in time range)  levothyroxine (SYNTHROID, LEVOTHROID) injection 44 mcg (has no administration in time range)  sodium chloride (PF) 0.9 % injection (  Not Given 04/07/20 0551)  levalbuterol (XOPENEX) nebulizer solution 1.25 mg (1.25 mg Nebulization Given 04/07/20 0649)  cefTRIAXone (ROCEPHIN) 1 g in sodium chloride 0.9 % 100 mL IVPB (1 g Intravenous New Bag/Given 04/07/20 0656)  LORazepam (ATIVAN) tablet 1-4 mg (has no administration in time range)    Or  LORazepam (ATIVAN) injection 1-4 mg (has no administration in time range)  thiamine tablet 100 mg (has no administration in time range)    Or  thiamine (B-1) injection 100 mg (has no administration in time range)  folic acid (FOLVITE) tablet 1 mg (has no administration in time range)  multivitamin with minerals tablet 1 tablet (has no administration in  time range)  LORazepam (ATIVAN) injection 0-4 mg (0 mg Intravenous Hold 04/07/20 0657)    Followed by  LORazepam (ATIVAN) injection 0-4 mg (has no administration in time range)  magnesium sulfate IVPB 2 g 50 mL (has no administration in time range)  LORazepam (ATIVAN) 2 MG/ML injection (2 mg  Given 04/07/20 0302)  sodium chloride 0.9 % bolus 1,000 mL (0 mLs Intravenous Stopped 04/07/20 0638)  levETIRAcetam (KEPPRA) IVPB 1000 mg/100 mL premix (0 mg Intravenous Stopped 04/07/20 0513)  aspirin suppository 300 mg (300 mg Rectal Given 04/07/20 0531)  ondansetron (ZOFRAN) injection 4 mg (4 mg Intravenous Given 04/07/20 0531)  heparin bolus via infusion 3,000 Units (3,000 Units Intravenous Bolus from Bag 04/07/20 0530)  iohexol (OMNIPAQUE) 350 MG/ML injection 100 mL (100 mLs Intravenous Contrast Given 04/07/20 0615)    ED Course  I have reviewed the triage vital signs and the nursing notes.  Pertinent labs & imaging results that were available during my care of the patient were reviewed by me and considered in my medical decision making (see chart for details).  MDM Rules/Calculators/A&P Report of emesis.  Seizure while in the ED.  Exam has localizing findings with eyes deviated to the left, decreased muscle tone on the right.  No known history of seizures.  Old records are reviewed, and she has no relevant past visits.  ECG had much artifact but showed no acute changes.  Chest x-ray was concerning for possible right lower lobe pneumonia.  No history suggestive of pneumonia, and she is afebrile.  On my review of the x-ray, I do not see any significant changes from her most recent chest x-ray.  Labs do show anemia which is slightly worse than prior, and mild leukocytosis.  Because of new seizure and possible localizing findings on exam, she is sent for CT of head.  She is reported to be a frequent user of ethanol and this may have been an alcohol withdrawal seizure.  Electrolytes are pending.  CT head  shows atrophy but no acute process.  Labs show severe hyponatremia and moderately severe hypokalemia.  It is possible that the hyponatremia is responsible for seizures, depending on how quickly it has occurred.  Because of hypokalemia, magnesium level was checked and was found to be low.  She is given supplemental potassium and magnesium.  CKs come back markedly elevated.  This in spite of the lack of acute findings on her ECG.  ECG was repeated, and is unchanged.  However, in light of apparent non-STEMI, she is given aspirin and started on heparin.  D-dimer has come back elevated and she is being sent for CT angiogram of the chest.  Case is discussed with Dr. Hal Hope of [facility] hospitalist who agrees to admit the patient.  Also, of note, family member is with her and states that she is a heavy drinker.  Possibility of alcohol withdrawal seizure exist.  Ethanol level is not detectable.  She will need to be observed for other signs of alcohol withdrawal.  Final Clinical Impression(s) / ED Diagnoses Final diagnoses:  Non-STEMI (non-ST elevated myocardial infarction) ([place])  Seizure ([place])  Hyponatremia  Hypomagnesemia  Hypokalemia  Elevated AST (SGOT)  Alkaline phosphatase elevation  Normochromic normocytic anemia  Elevated d-dimer    Rx / DC Orders ED Discharge Orders    None       Delora Fuel, MD 19/41/74 0710

## 2020-04-07 NOTE — Progress Notes (Signed)
[place] for heparin Indication: ACS/STEMI  Allergies  Allergen Reactions  . Ampicillin Diarrhea and Nausea Only    GI upset  . Azithromycin Diarrhea and Nausea And Vomiting  . Codeine Nausea And Vomiting and Other (See Comments)    Makes her "crazy"  . Daliresp [Roflumilast] Nausea Only  . Nitrofurantoin Diarrhea and Nausea Only  . Other Other (See Comments)    No nuts and seeds because of diverticulitis  . Penicillins Nausea And Vomiting, Swelling and Rash    Has patient had a PCN reaction causing immediate rash, facial/tongue/throat swelling, SOB or lightheadedness with hypotension: Yes Has patient had a PCN reaction causing severe rash involving mucus membranes or skin necrosis: No Has patient had a PCN reaction that required hospitalization No Has patient had a PCN reaction occurring within the last 10 years: No No "cillins" If all of the above answers are "NO", then may proceed with Cephalosporin use.   . Sulfonamide Derivatives Diarrhea and Nausea And Vomiting  . Tiotropium Bromide Monohydrate     Urinary retention  . Cephalosporins Nausea And Vomiting    IV seems to be ok  . Doxycycline Nausea Only  . Protonix [Pantoprazole] Nausea Only  . Fludrocortisone Acetate Itching  . Lactose Intolerance (Gi) Other (See Comments)    Gas and bloating   . Latex Itching and Rash  . Prednisone Other (See Comments)    Has tolerated Prednisone per daughter    Patient Measurements: Height: 5\' 6"  (167.6 cm) Weight: 60.4 kg (133 lb 2.5 oz) IBW/kg (Calculated) : 59.3 Heparin Dosing Weight: 60.4kg  Vital Signs: Temp: 97.9 F (36.6 C) (10/19 1916) Temp Source: Axillary (10/19 1916) BP: 135/81 (10/19 2200) Pulse Rate: 100 (10/19 2200)  Labs: Recent Labs    04/07/20 0306 04/07/20 0306 04/07/20 0319 04/07/20 0527 04/07/20 1047 04/07/20 1322 04/07/20 1334 04/07/20 2140  HGB 10.3*  --   --   --   --   --   --   --   HCT 32.1*  --   --    --   --   --   --   --   PLT 188  --   --   --   --   --   --   --   APTT  --   --   --  33  --   --   --   --   LABPROT  --   --   --  14.9  --   --   --   --   INR  --   --   --  1.2  --   --   --   --   HEPARINUNFRC  --   --   --   --   --  0.54  --  0.55  CREATININE 0.66   < >  --  0.65 0.52  --  0.60  --   TROPONINIHS  --   --  3,265* 3,454* 2,165*  --   --   --    < > = values in this interval not displayed.    Estimated Creatinine Clearance: 49 mL/min (by C-G formula based on SCr of 0.6 mg/dL).   Assessment: 86 yo female came in c/o N/V, dyspnea.  Pt had a generalized seizure while in the x-ray dept.  Pharmacy consulted to dose heparin for ACS/ STEMI.  No prior AC noted  Today, 04/07/2020:  CBC WNL  First  heparin level therapeutic on 700 units/hr  SCr stable WNL - at baseline  No bleeding or infusion issues per RN  Cards deciding on utility of LHC  2140 confirmatory HL=0.55, therapeutic  Goal of Therapy:  Heparin level 0.3-0.7 units/ml Monitor platelets by anticoagulation protocol   Plan:   Continue heparin infusion at 700 units/hr  Daily CBC and heparin level  Monitor for s/s bleeding or thrombosis  Dolly Rias RPh 04/07/2020, 11:34 PM

## 2020-04-07 NOTE — Progress Notes (Signed)
PO ativan PRN administered

## 2020-04-07 NOTE — ED Notes (Signed)
Pt was in radiology and had seizure activity and was then transferred to room 4. Pt postictal and no prior hx of seizures.

## 2020-04-07 NOTE — H&P (Addendum)
History and Physical    [name] OIZ:[id number] DOB: 06-18-1933 DOA: 04/07/2020  PCP: Binnie Rail, MD  Patient coming from: Home.  Chief Complaint: Nausea vomiting.  HPI: [name] is a 86 y.o. female with history of COPD on 3 L oxygen at home with history of alcohol abuse and hypothyroidism presented to the ER because of worsening nausea vomiting since last night with abdominal discomfort.  Patient also became progressively short of breath after vomiting started.  When patient initially came to the ER did not complain of any chest pain but later has been having some abdominal discomfort mainly in the epigastric area.  Was not eating well for the last couple of days because of the nausea.  Drinks brandy every night.  ED Course: In the ER patient was short of breath tachycardic and wheezing.  While in the ER patient had a brief episode of generalized tonic-clonic seizure for which patient was given Keppra 1 g loading dose of CT head was unremarkable.  Seizure lasted less than a minute following which patient was postictal but improved quickly.  EKG shows sinus tachycardia labs are remarkable for sodium of 119 potassium 2.7 glucose 177 alkaline phosphatase 163 AST 103 total bilirubin 1.3 BNP 404 high sensitive troponin of 3200 WBC 13.1 Covid test negative chest x-ray shows possible infiltrates.  Patient was started on heparin infusion for non-ST elevation MI after aspirin was given admitted for non-ST elevation MI with severe hyponatremia seizure and nausea vomiting with abdominal discomfort.  Review of Systems: As per HPI, rest all negative.   Past Medical History:  Diagnosis Date  . Allergic rhinitis   . Anxiety   . Arthritis    "back, hands; hips" (12/22/2014)  . Asthma   . Bladder atony   . Chronic bronchitis ([place])   . COPD (chronic obstructive pulmonary disease) ([place])   . Diarrhea   . Emphysema of lung ([place])   . GERD (gastroesophageal reflux disease)    "w/spicey  foods" (12/22/2014)  . Hyperlipidemia   . Non-small cell carcinoma of lung, stage 1 ([place]) 2005   1.4 cm Poorly differentiated Squamous cell RUL  T1N0 resected 09/22/2003  . On home oxygen therapy    "3L q night and prn during the day" (12/22/2014)  . Pelvic cyst   . Pneumonia    "this is the 3rd time that I can remember" (12/22/2014)  . Unspecified hypothyroidism     Past Surgical History:  Procedure Laterality Date  . ABDOMINAL HYSTERECTOMY  ~ 1976  . BACK SURGERY    . CATARACT EXTRACTION W/ INTRAOCULAR LENS  IMPLANT, BILATERAL Bilateral ~ 2012  . DILATION AND CURETTAGE OF UTERUS    . LUMBAR DISC SURGERY  1970's  . LYMPH NODE DISSECTION  2005   "all my lymph nodes under breasts, went thru my back; Dr. Arlyce Dice did it"  . Needle aspiration Pelvic cyst  2011   Dr Diona Fanti  . VIDEO ASSISTED THORACOSCOPY (VATS)/THOROCOTOMY Right 09/2003   thoracotomy, right upper lobectomy with node dissection; Dr Demetrios Loll 11/02/2010  . VIDEO BRONCHOSCOPY  03/2004   Archie Endo 11/02/2010     reports that she quit smoking about 16 years ago. Her smoking use included cigarettes. She has a 42.00 pack-year smoking history. She has never used smokeless tobacco. She reports current alcohol use of about 14.0 standard drinks of alcohol per week. She reports that she does not use drugs.  Allergies  Allergen Reactions  . Ampicillin Diarrhea and Nausea Only  GI upset  . Azithromycin Diarrhea and Nausea And Vomiting  . Codeine Nausea And Vomiting and Other (See Comments)    Makes her "crazy"  . Daliresp [Roflumilast] Nausea Only  . Nitrofurantoin Diarrhea and Nausea Only  . Other Other (See Comments)    No nuts and seeds because of diverticulitis  . Penicillins Nausea And Vomiting, Swelling and Rash    Has patient had a PCN reaction causing immediate rash, facial/tongue/throat swelling, SOB or lightheadedness with hypotension: Yes Has patient had a PCN reaction causing severe rash involving mucus membranes or  skin necrosis: No Has patient had a PCN reaction that required hospitalization No Has patient had a PCN reaction occurring within the last 10 years: No No "cillins" If all of the above answers are "NO", then may proceed with Cephalosporin use.   . Sulfonamide Derivatives Diarrhea and Nausea And Vomiting  . Tiotropium Bromide Monohydrate     Urinary retention  . Cephalosporins Nausea And Vomiting    IV seems to be ok  . Doxycycline Nausea Only  . Protonix [Pantoprazole] Nausea Only  . Fludrocortisone Acetate Itching  . Lactose Intolerance (Gi) Other (See Comments)    Gas and bloating   . Latex Itching and Rash  . Prednisone Other (See Comments)    Has tolerated Prednisone per daughter    Family History  Problem Relation Age of Onset  . Heart disease Father   . Rheumatologic disease Brother   . Cancer Brother        Leukemia  . Rheumatologic disease Sister   . Breast cancer Daughter     Prior to Admission medications   Medication Sig Start Date End Date Taking? Authorizing Provider  ALPRAZolam Duanne Moron) 1 MG tablet TAKE 1 TABLET BY MOUTH EVERYDAY AT BEDTIME 03/09/20  Yes Burns, Claudina Lick, MD  atorvastatin (LIPITOR) 20 MG tablet TAKE 1 TABLET (20 MG TOTAL) BY MOUTH DAILY AT 6 PM. Patient taking differently: Take 20 mg by mouth See admin instructions. Takes on Mon, Wed, and Fri 05/20/19  Yes Burns, Claudina Lick, MD  Cholecalciferol (VITAMIN D) 2000 UNITS tablet Take 1 tablet (2,000 Units total) by mouth daily. 04/29/15  Yes Rowe Clack, MD  Ferrous Fumarate (HEMOCYTE - 106 MG FE) 324 (106 Fe) MG TABS tablet Take 1 tablet by mouth 3 (three) times a week.   Yes [provider]  FLOVENT HFA 110 MCG/ACT inhaler TAKE 2 PUFFS BY MOUTH TWICE A DAY 01/21/20  Yes Young, Clinton D, MD  levalbuterol (XOPENEX) 1.25 MG/3ML nebulizer solution Take 1.25 mg by nebulization every 6 (six) hours as needed for wheezing. DX: Bronchiectasis 09/27/19  Yes Young, Tarri Fuller D, MD  levothyroxine  (SYNTHROID) 88 MCG tablet TAKE 1 TABLET BY MOUTH EVERY DAY 03/23/20  Yes Burns, Claudina Lick, MD  Multiple Vitamins-Minerals (CENTRUM SILVER PO) Take 1 tablet by mouth daily.    Yes [provider]  ondansetron (ZOFRAN) 4 MG tablet Take 1 tablet (4 mg total) by mouth 3 (three) times daily as needed for nausea or vomiting. 03/26/20  Yes Janith Lima, MD  Probiotic Product (PROBIOTIC PO) Take 1 capsule by mouth daily.   Yes [provider]  Respiratory Therapy Supplies (FLUTTER) DEVI Blow through 4 times per cycle and repeat 3 cycles per day 10/15/13  Yes Young, Tarri Fuller D, MD  cefdinir (OMNICEF) 250 MG/5ML suspension Take 6 mLs (300 mg total) by mouth 2 (two) times daily. 03/26/20   Janith Lima, MD    Physical Exam:  Constitutional: Moderately built and nourished. Vitals:   04/07/20 0330 04/07/20 0430 04/07/20 0445 04/07/20 0500  BP: 132/85 (!) 122/108 (!) 158/99 (!) 114/103  Pulse: (!) 107 (!) 106 (!) 105 (!) 106  Resp: (!) 21 (!) 29 (!) 26 (!) 22  Temp:      TempSrc:      SpO2: 100% 100% 100% 100%  Weight:      Height:       Eyes: Anicteric no pallor. ENMT: No discharge from the ears eyes nose or mouth. Neck: No mass felt.  No neck rigidity. Respiratory: Mild expiratory wheezes bilaterally.  No crepitations. Cardiovascular: S1-S2 heard. Abdomen: Soft epigastric tenderness no guarding or rigidity. Musculoskeletal: No edema. Skin: Chronic skin changes in the lower extremity. Neurologic: Alert awake oriented to time place and person.  Moves all extremities. Psychiatric: Appears normal.  Normal affect.   Labs on Admission: I have personally reviewed following labs and imaging studies  CBC: Recent Labs  Lab 04/07/20 0306  WBC 13.1*  HGB 10.3*  HCT 32.1*  MCV 99.1  PLT 161   Basic Metabolic Panel: Recent Labs  Lab 04/07/20 0306  NA 119*  K 2.7*  CL 82*  CO2 17*  GLUCOSE 177*  BUN 6*  CREATININE 0.66  CALCIUM 8.0*   GFR: Estimated Creatinine  Clearance: 49 mL/min (by C-G formula based on SCr of 0.66 mg/dL). Liver Function Tests: Recent Labs  Lab 04/07/20 0306  AST 103*  ALT 39  ALKPHOS 163*  BILITOT 1.1  PROT 6.5  ALBUMIN 3.0*   No results for input(s): LIPASE, AMYLASE in the last 168 hours. No results for input(s): AMMONIA in the last 168 hours. Coagulation Profile: No results for input(s): INR, PROTIME in the last 168 hours. Cardiac Enzymes: No results for input(s): CKTOTAL, CKMB, CKMBINDEX, TROPONINI in the last 168 hours. BNP (last 3 results) No results for input(s): PROBNP in the last 8760 hours. HbA1C: No results for input(s): HGBA1C in the last 72 hours. CBG: Recent Labs  Lab 04/07/20 0308  GLUCAP 165*   Lipid Profile: No results for input(s): CHOL, HDL, LDLCALC, TRIG, CHOLHDL, LDLDIRECT in the last 72 hours. Thyroid Function Tests: No results for input(s): TSH, T4TOTAL, FREET4, T3FREE, THYROIDAB in the last 72 hours. Anemia Panel: No results for input(s): VITAMINB12, FOLATE, FERRITIN, TIBC, IRON, RETICCTPCT in the last 72 hours. Urine analysis:    Component Value Date/Time   COLORURINE STRAW (A) 11/26/2019 2130   APPEARANCEUR CLEAR 11/26/2019 2130   LABSPEC 1.004 (L) 11/26/2019 2130   PHURINE 6.0 11/26/2019 2130   GLUCOSEU NEGATIVE 11/26/2019 2130   GLUCOSEU NEGATIVE 05/08/2018 1657   HGBUR NEGATIVE 11/26/2019 2130   HGBUR negative 07/30/2009 1254   BILIRUBINUR NEGATIVE 11/26/2019 2130   BILIRUBINUR neg 10/10/2012 1334   KETONESUR NEGATIVE 11/26/2019 2130   PROTEINUR NEGATIVE 11/26/2019 2130   UROBILINOGEN 0.2 05/08/2018 1657   NITRITE NEGATIVE 11/26/2019 2130   LEUKOCYTESUR NEGATIVE 11/26/2019 2130   Sepsis Labs: @LABRCNTIP (procalcitonin:4,lacticidven:4) )No results found for this or any previous visit (from the past 240 hour(s)).   Radiological Exams on Admission: DG Chest 2 View  Result Date: 04/07/2020 CLINICAL DATA:  86 year old female with shortness of breath.  COPD. EXAM: CHEST -  2 VIEW COMPARISON:  03/12/2020 chest radiographs and earlier. FINDINGS: Upright AP and lateral views. 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 Right lung base calcified pleural plaques demonstrated by CT in 2018. Mediastinal contours are stable. No pneumothorax. But there is increased  reticulonodular opacity in the right lower lung compared to March of this year. Left lung appears stable. Calcified aortic atherosclerosis. Osteopenia. No acute osseous abnormality identified. Paucity of bowel gas in the upper abdomen. IMPRESSION: 1. Chronic lung disease with increased reticulonodular opacity in the right lower lung compatible with acute infectious exacerbation. 2. Aortic Atherosclerosis (ICD10-I70.0) and Emphysema (ICD10-J43.9). Electronically Signed   By: Genevie Ann M.D.   On: 04/07/2020 03:05   CT Head Wo Contrast  Result Date: 04/07/2020 CLINICAL DATA:  Nontraumatic seizure EXAM: CT HEAD WITHOUT CONTRAST TECHNIQUE: Contiguous axial images were obtained from the base of the skull through the vertex without intravenous contrast. COMPARISON:  01/08/2020 FINDINGS: Brain: No evidence of acute infarction, hemorrhage, hydrocephalus, extra-axial collection or mass lesion/mass effect. Generalized atrophy with chronic small vessel ischemic low-density in the deep white matter. Vascular: No hyperdense vessel or unexpected calcification. Skull: Normal. Negative for fracture or focal lesion. Sinuses/Orbits: No acute finding.  Bilateral cataract resection. Other: Motion degraded. IMPRESSION: 1. Aging brain without acute superimposed finding. 2. Intermittent motion artifact. Electronically Signed   By: Monte Fantasia M.D.   On: 04/07/2020 04:23    EKG: Independently reviewed.  Sinus tachycardia.  Assessment/Plan Principal Problem:   Non-ST elevation MI (NSTEMI) ([facility]) Active Problems:   Hypothyroidism   COPD mixed type ([facility])   Non-small cell carcinoma of lung,  stage 1 ([facility])   Chronic respiratory failure with hypoxia ([facility])   Nausea & vomiting   Hyponatremia   Alcohol abuse    1. Non-ST elevation MI -patient has been started on heparin infusion and has been placed on aspirin.  Not on any beta-blocker since patient is actively wheezing at this time.  Will trend cardiac markers check 2D echo patient is not having any active chest pain.  Cardiology has been notified. 2. Severe hyponatremia could be secondary to alcohol abuse and poor oral intake.  Patient was given 1 L normal saline bolus in the ER.  Will have further orders based on the urine studies and metabolic panel trends.  Follow metabolic panel closely. 3. Seizures in the setting of severe hyponatremia and also patient states she has decreased her alcohol intake last few days.  CT head is unremarkable Keppra was given 1 dose loading.  We will get neurology involved.  Patient is presently alert awake and moving all extremities. 4. Intractable nausea vomiting with abdominal discomfort concerning for intra-abdominal cause specifically to have a rule out pancreatitis for which I have ordered CT abdomen pelvis.  Check lipase.  Follow LFTs.  Presently n.p.o. 5. COPD with exacerbation and possible infiltrates for which patient has been placed on ceftriaxone Xopenex.  Patient has multiple allergy to antibiotics. 6. Alcohol abuse on CIWA protocol. 7. Hypothyroidism we will dose of Synthroid as IV for now. 8. Anemia follow CBC. 9. Hyperglycemia check hemoglobin A1c. 10. History of lung cancer status post resection in remission.  Since patient has acute non-ST elevation MI with super extensibility seizures hyponatremia will need close monitoring for any further worsening in inpatient status.   DVT prophylaxis: Heparin infusion. Code Status: DNR confirmed with patient's daughter. Family Communication: Patient's daughter. Disposition Plan: Home. Consults called: Cardiology and neurology. Admission status:  Inpatient.   Rise Patience MD [facility] Pager [phone].  If 7PM-7AM, please contact night-coverage [email or web address] Password [facility]  04/07/2020, 5:37 AM

## 2020-04-07 NOTE — ED Notes (Signed)
CRITICAL VALUE ALERT  Critical Value:  Lactic Acid 3.6  Date & Time Notied:  04/07/2020, 2633  Provider Notified: Darrick Meigs MD  Orders Received/Actions taken: MD aware

## 2020-04-07 NOTE — Progress Notes (Signed)
  Echocardiogram 2D Echocardiogram has been performed.  Bobbye Charleston 04/07/2020, 11:47 AM

## 2020-04-07 NOTE — Progress Notes (Signed)
EEG complete - results pending 

## 2020-04-07 NOTE — ED Notes (Signed)
EEG at bedside.

## 2020-04-07 NOTE — ED Notes (Signed)
Date and time results received: 04/07/20 0422  (use smartphrase ".now" to insert current time)  Test: Potassium  Critical Value: 2.7  Name of Provider Notified: Roxanne Mins, MD   Orders Received? Or Actions Taken?: Orders Received - See Orders for details

## 2020-04-07 NOTE — Consult Note (Addendum)
Cardiology Consultation:   Patient ID: [name] MRN: [id number]; DOB: 18-Aug-1932  Admit date: 04/07/2020 Date of Consult: 04/07/2020  Primary Care Provider: Binnie Rail, MD [facility] Cardiologist: New (Dr. Johney Esparza) [place] Electrophysiologist:  None    Patient Profile:   [name] is a 86 y.o. female with a history of COPD/emphesema on 3L O2 at home, small cell lung cancer s/p right upper lobectomy in 2012, alcohol abuse with history of withdrawal/DTs, hypothyroidism who is being seen today for the evaluation of NSTEMI at the request of Dr. Darrick Esparza.  History of Present Illness:   [name] is a 86 year old female with the above history. She has not been seen by Cardiology in the past. No know cardiac history or prior cardiac work-up.  Patient presented to the [facility] ED today for nausea, vomiting, and abdominal discomfort. Initially, she did not complain of chest pain. However, after vomiting she became short of breath and noted chest discomfort.   In the ED, patient hypertensive and tachycardic. Initial EKG showed sinus tachycardia with a lot of underlying artifact. Repeat EKG showed sinus rhythm with no acute ischemic changes. High-sensitivity troponin elevated at 3,265 >> 3,454 >> 2,165. Chest x-ray showed chronic lung disease with possible right lower lung pneumonia. BNP 404. D-dimer elevated. Chest CTA negative for PE but showed right lower lobe pneumonia or atelectasis with borderline interstitial edema and advanced COPD. Abdominal/pelvic CT with no acute findings. WBC 13.1, Hgb 10.3, Plts 188. Na 119, K 2.7, Mg 1.3, Cl 82, Glucose 177, Cr 0.66, Anion gap 20. Albumin 2.0, AST 103, ALT 39, Alk Phos 163. Total Bili 1.1. ABG showed pH of 7.3, pCO2 37.3, pO2 153, Bicarbonate 19. Lactic acid 3.6. Lipase normal. TSH elevated at 10.577. Ethanol <10. Urine drug screen positive for benzodiazepines. While in the ED, patient had a brief episode of tonic-clonic  seizure for which she was loaded with Keppra. Head CT unremarkable. Felt to be due to severe hyponatremia. Patient given Aspirin and started on IV Heparin and IV fluids and admitted.   At the time of this evaluation, patient in no acute distress. Daughter is at bedside and assists with history. Patient has has nausea and reflux symptoms (frequent burping and some regurgitation) off and on for the last 3 months. It has been worse recently and then night before presentation she had significant vomiting that scared her husband. She has also had very poor oral intake over the last 4-5 days and a general feeling of unwellness for a while. She had an episode of chest discomfort in the ED after vomiting but denies any other episodes of chest pain. She has chronic shortness of breath with her COPD and states this has been stable. No orthopnea. She notes a couple of episodes of feeling like she cannot catch her breath at night recently but difficult to say if this is truly PND. She has some chronic right lower extremity/foot swelling from arthritis in that leg. Daughter also reports that her left foot has been swollen recently. This was first noticed after she had two falls in one week. These falls sounds mechanical where she just "crumples" to the ground. No palpitations, lightheadedness, dizziness, syncope. Daughter states patient has a hard time getting around and is not very mobile.   Of note, patient recently diagnosed with sialadenitis and has had jaw pain and swelling with this. She was started on a liquid antibiotic for this but was not able to take full course  due to diarrhea (she has a lot of trouble with oral antibiotics). No other recent illnesses.  Patient has remote smoking history but quit at the time of her partial lung resection. She has a history of alcohol abuse. Patient states she does not drink much - just a occasional glass of wine. However, daughter states she also has a Microbiologist. When further  questioned, patient states she just has a "dab" of Brandy with Ginger Ale at night. Daughter states it is more like 3 dabs of Brandy. Daughter states she is not upfront about how much she drinks and she is not sure when her last drink was. Per daughter, she does have a history of alcohol withdrawal and DTs but this was reportedly 20-30 years ago.    Past Medical History:  Diagnosis Date  . Allergic rhinitis   . Anxiety   . Arthritis    "back, hands; hips" (12/22/2014)  . Asthma   . Bladder atony   . Chronic bronchitis ([place])   . COPD (chronic obstructive pulmonary disease) ([place])   . Diarrhea   . Emphysema of lung ([place])   . GERD (gastroesophageal reflux disease)    "w/spicey foods" (12/22/2014)  . Hyperlipidemia   . Non-small cell carcinoma of lung, stage 1 ([place]) 2005   1.4 cm Poorly differentiated Squamous cell RUL  T1N0 resected 09/22/2003  . On home oxygen therapy    "3L q night and prn during the day" (12/22/2014)  . Pelvic cyst   . Pneumonia    "this is the 3rd time that I can remember" (12/22/2014)  . Unspecified hypothyroidism     Past Surgical History:  Procedure Laterality Date  . ABDOMINAL HYSTERECTOMY  ~ 1976  . BACK SURGERY    . CATARACT EXTRACTION W/ INTRAOCULAR LENS  IMPLANT, BILATERAL Bilateral ~ 2012  . DILATION AND CURETTAGE OF UTERUS    . LUMBAR DISC SURGERY  1970's  . LYMPH NODE DISSECTION  2005   "all my lymph nodes under breasts, went thru my back; Dr. Arlyce Dice did it"  . Needle aspiration Pelvic cyst  2011   Dr Diona Fanti  . VIDEO ASSISTED THORACOSCOPY (VATS)/THOROCOTOMY Right 09/2003   thoracotomy, right upper lobectomy with node dissection; Dr Demetrios Loll 11/02/2010  . VIDEO BRONCHOSCOPY  03/2004   Archie Endo 11/02/2010     Home Medications:  Prior to Admission medications   Medication Sig Start Date End Date Taking? Authorizing Provider  ALPRAZolam Duanne Moron) 1 MG tablet TAKE 1 TABLET BY MOUTH EVERYDAY AT BEDTIME 03/09/20  Yes Burns, Claudina Lick, MD  atorvastatin  (LIPITOR) 20 MG tablet TAKE 1 TABLET (20 MG TOTAL) BY MOUTH DAILY AT 6 PM. Patient taking differently: Take 20 mg by mouth See admin instructions. Takes on Mon, Wed, and Fri 05/20/19  Yes Burns, Claudina Lick, MD  Cholecalciferol (VITAMIN D) 2000 UNITS tablet Take 1 tablet (2,000 Units total) by mouth daily. 04/29/15  Yes Rowe Clack, MD  Ferrous Fumarate (HEMOCYTE - 106 MG FE) 324 (106 Fe) MG TABS tablet Take 1 tablet by mouth 3 (three) times a week.   Yes [provider]  FLOVENT HFA 110 MCG/ACT inhaler TAKE 2 PUFFS BY MOUTH TWICE A DAY 01/21/20  Yes Young, Clinton D, MD  levalbuterol (XOPENEX) 1.25 MG/3ML nebulizer solution Take 1.25 mg by nebulization every 6 (six) hours as needed for wheezing. DX: Bronchiectasis 09/27/19  Yes Baird Lyons D, MD  levothyroxine (SYNTHROID) 88 MCG tablet TAKE 1 TABLET BY MOUTH EVERY DAY 03/23/20  Yes Billey Gosling  J, MD  Multiple Vitamins-Minerals (CENTRUM SILVER PO) Take 1 tablet by mouth daily.    Yes [provider]  ondansetron (ZOFRAN) 4 MG tablet Take 1 tablet (4 mg total) by mouth 3 (three) times daily as needed for nausea or vomiting. 03/26/20  Yes Janith Lima, MD  Probiotic Product (PROBIOTIC PO) Take 1 capsule by mouth daily.   Yes [provider]  Respiratory Therapy Supplies (FLUTTER) DEVI Blow through 4 times per cycle and repeat 3 cycles per day 10/15/13  Yes Young, Tarri Fuller D, MD  cefdinir (OMNICEF) 250 MG/5ML suspension Take 6 mLs (300 mg total) by mouth 2 (two) times daily. 03/26/20   Janith Lima, MD    Inpatient Medications: Scheduled Meds: . aspirin  300 mg Rectal Daily  . folic acid  1 mg Oral Daily  . levalbuterol  1.25 mg Nebulization Q6H  . levothyroxine  44 mcg Intravenous Daily  . LORazepam  0-4 mg Intravenous Q6H   Followed by  . [START ON 04/09/2020] LORazepam  0-4 mg Intravenous Q12H  . multivitamin with minerals  1 tablet Oral Daily  . sodium chloride (PF)      . thiamine  100 mg Oral Daily   Or  .  thiamine  100 mg Intravenous Daily   Continuous Infusions: . cefTRIAXone (ROCEPHIN)  IV Stopped (04/07/20 0715)  . heparin 700 Units/hr (04/07/20 1041)   PRN Meds: LORazepam **OR** LORazepam, morphine injection  Allergies:    Allergies  Allergen Reactions  . Ampicillin Diarrhea and Nausea Only    GI upset  . Azithromycin Diarrhea and Nausea And Vomiting  . Codeine Nausea And Vomiting and Other (See Comments)    Makes her "crazy"  . Daliresp [Roflumilast] Nausea Only  . Nitrofurantoin Diarrhea and Nausea Only  . Other Other (See Comments)    No nuts and seeds because of diverticulitis  . Penicillins Nausea And Vomiting, Swelling and Rash    Has patient had a PCN reaction causing immediate rash, facial/tongue/throat swelling, SOB or lightheadedness with hypotension: Yes Has patient had a PCN reaction causing severe rash involving mucus membranes or skin necrosis: No Has patient had a PCN reaction that required hospitalization No Has patient had a PCN reaction occurring within the last 10 years: No No "cillins" If all of the above answers are "NO", then may proceed with Cephalosporin use.   . Sulfonamide Derivatives Diarrhea and Nausea And Vomiting  . Tiotropium Bromide Monohydrate     Urinary retention  . Cephalosporins Nausea And Vomiting    IV seems to be ok  . Doxycycline Nausea Only  . Protonix [Pantoprazole] Nausea Only  . Fludrocortisone Acetate Itching  . Lactose Intolerance (Gi) Other (See Comments)    Gas and bloating   . Latex Itching and Rash  . Prednisone Other (See Comments)    Has tolerated Prednisone per daughter    Social History:   Social History   Socioeconomic History  . Marital status: Married    Spouse name: Not on file  . Number of children: 2  . Years of education: 58  . Highest education level: High school graduate  Occupational History  . Occupation: Retired Animator, School system    Employer: RETIRED  Tobacco Use  . Smoking  status: Former Smoker    Packs/day: 1.00    Years: 42.00    Pack years: 42.00    Types: Cigarettes    Quit date: 09/22/2003    Years since quitting: 16.5  .  Smokeless tobacco: Never Used  Vaping Use  . Vaping Use: Never used  Substance and Sexual Activity  . Alcohol use: Yes    Alcohol/week: 14.0 standard drinks    Types: 14 Glasses of wine per week    Comment: 12/22/2014 "I have a large glass of wine qd"  . Drug use: No  . Sexual activity: Never  Other Topics Concern  . Not on file  Social History Narrative   Lives with husband in a one story home.  Has 2 children.  Retired.  Education: high school.    Social Determinants of Health   Financial Resource Strain:   . Difficulty of Paying Living Expenses: Not on file  Food Insecurity:   . Worried About Charity fundraiser in the Last Year: Not on file  . Ran Out of Food in the Last Year: Not on file  Transportation Needs:   . Lack of Transportation (Medical): Not on file  . Lack of Transportation (Non-Medical): Not on file  Physical Activity:   . Days of Exercise per Week: Not on file  . Minutes of Exercise per Session: Not on file  Stress:   . Feeling of Stress : Not on file  Social Connections:   . Frequency of Communication with Friends and Family: Not on file  . Frequency of Social Gatherings with Friends and Family: Not on file  . Attends Religious Services: Not on file  . Active Member of Clubs or Organizations: Not on file  . Attends Archivist Meetings: Not on file  . Marital Status: Not on file  Intimate Partner Violence:   . Fear of Current or Ex-Partner: Not on file  . Emotionally Abused: Not on file  . Physically Abused: Not on file  . Sexually Abused: Not on file    Family History:   Family History  Problem Relation Age of Onset  . Heart disease Father   . Rheumatologic disease Brother   . Cancer Brother        Leukemia  . Rheumatologic disease Sister   . Breast cancer Daughter      ROS:    Please see the history of present illness.  All other ROS reviewed and negative.     Physical Exam/Data:   Vitals:   04/07/20 1000 04/07/20 1015 04/07/20 1032 04/07/20 1101  BP: 113/89 (!) 135/102 (!) 137/113 131/77  Pulse: (!) 101 99 100 97  Resp: (!) 24 (!) 26 (!) 29 (!) 25  Temp:      TempSrc:      SpO2: 100% 100% 100% 100%  Weight:      Height:        Intake/Output Summary (Last 24 hours) at 04/07/2020 1125 Last data filed at 04/07/2020 1041 Gross per 24 hour  Intake 430.87 ml  Output --  Net 430.87 ml   Last 3 Weights 04/07/2020 03/12/2020 02/26/2020  Weight (lbs) 133 lb 2.5 oz 133 lb 3.2 oz (No Data)  Weight (kg) 60.4 kg 60.419 kg (No Data)     Body mass index is 21.49 kg/m.  General: 86 y.o. female resting comfortably in no acute distress. HEENT: Normocephalic and atraumatic. Sclera clear. Neck: Supple. No JVD. Heart: Borderline tachycardic with regular rhythm. Distinct S1 and S2. No significant murmurs, gallops, or rubs. Radial and distal pedal pulses 2+ and equal bilaterally. Lungs: On supplemental O2 via nasal cannula. Decreased breath sounds with mild wheezing throughout. Abdomen: Soft, non-distended, and non-tender to palpation. Bowel sounds present. Extremities:  Trace to mild lower extremity edema (right > left which is chronic).    Skin: Warm and dry. Neuro: Alert and oriented x3. No focal deficits. Tremor in bilateral hands. Psych: Normal affect. Responds appropriately.   EKG:  The EKG was personally reviewed and demonstrates:  Normal sinus rhythm, rate 98 bpm, with non-specific ST/T changes. QTc 495 ms. Telemetry:  Telemetry was personally reviewed and demonstrates:  Normal sinus rhythm with rates in the 90's to low 100's. PVCs noted, sometimes in trigeminy pattern.  Relevant CV Studies:  Echocardiogram 04/07/2020: Impressions: 1. Left ventricular ejection fraction, by estimation, is 45 to 50%. The  left ventricle has mildly decreased function. The  left ventricle  demonstrates global hypokinesis. Left ventricular diastolic parameters are  consistent with Grade I diastolic  dysfunction (impaired relaxation).  2. Right ventricular systolic function was not well visualized. The right  ventricular size is not well visualized.  3. The mitral valve was not well visualized. No evidence of mitral valve  regurgitation.  4. The aortic valve was not well visualized. Aortic valve regurgitation  is not visualized.  5. The inferior vena cava is dilated in size with <50% respiratory  variability, suggesting right atrial pressure of 15 mmHg.   Comparison(s): No prior Echocardiogram.   Conclusion(s)/Recommendation(s): Difficult study even with echo constrast.   Laboratory Data:  High Sensitivity Troponin:   Recent Labs  Lab 04/07/20 0319 04/07/20 0527  TROPONINIHS 3,265* 3,454*     Chemistry Recent Labs  Lab 04/07/20 0306 04/07/20 0527  NA 119* 119*  K 2.7* 3.0*  CL 82* 83*  CO2 17* 22  GLUCOSE 177* 116*  BUN 6* <5*  CREATININE 0.66 0.65  CALCIUM 8.0* 7.7*  GFRNONAA >60 >60  ANIONGAP 20* 14    Recent Labs  Lab 04/07/20 0306 04/07/20 0527  PROT 6.5 5.8*  ALBUMIN 3.0* 2.5*  AST 103* 98*  ALT 39 34  ALKPHOS 163* 138*  BILITOT 1.1 1.0   Hematology Recent Labs  Lab 04/07/20 0306  WBC 13.1*  RBC 3.24*  HGB 10.3*  HCT 32.1*  MCV 99.1  MCH 31.8  MCHC 32.1  RDW 12.5  PLT 188   BNP Recent Labs  Lab 04/07/20 0320  BNP 404.1*    DDimer  Recent Labs  Lab 04/07/20 0340  DDIMER 2.91*     Radiology/Studies:  DG Chest 2 View  Result Date: 04/07/2020 CLINICAL DATA:  86 year old female with shortness of breath.  COPD. EXAM: CHEST - 2 VIEW COMPARISON:  03/12/2020 chest radiographs and earlier. FINDINGS: Upright AP and lateral views. 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 Right lung base calcified pleural plaques demonstrated by CT in 2018. Mediastinal  contours are stable. No pneumothorax. But there is increased reticulonodular opacity in the right lower lung compared to March of this year. Left lung appears stable. Calcified aortic atherosclerosis. Osteopenia. No acute osseous abnormality identified. Paucity of bowel gas in the upper abdomen. IMPRESSION: 1. Chronic lung disease with increased reticulonodular opacity in the right lower lung compatible with acute infectious exacerbation. 2. Aortic Atherosclerosis (ICD10-I70.0) and Emphysema (ICD10-J43.9). Electronically Signed   By: Genevie Ann M.D.   On: 04/07/2020 03:05   CT Head Wo Contrast  Result Date: 04/07/2020 CLINICAL DATA:  Nontraumatic seizure EXAM: CT HEAD WITHOUT CONTRAST TECHNIQUE: Contiguous axial images were obtained from the base of the skull through the vertex without intravenous contrast. COMPARISON:  01/08/2020 FINDINGS: Brain: No evidence of acute infarction, hemorrhage, hydrocephalus, extra-axial collection  or mass lesion/mass effect. Generalized atrophy with chronic small vessel ischemic low-density in the deep white matter. Vascular: No hyperdense vessel or unexpected calcification. Skull: Normal. Negative for fracture or focal lesion. Sinuses/Orbits: No acute finding.  Bilateral cataract resection. Other: Motion degraded. IMPRESSION: 1. Aging brain without acute superimposed finding. 2. Intermittent motion artifact. Electronically Signed   By: Monte Fantasia M.D.   On: 04/07/2020 04:23   CT Angio Chest PE W and/or Wo Contrast  Result Date: 04/07/2020 CLINICAL DATA:  Positive D-dimer.  Can not catch breath EXAM: CT ANGIOGRAPHY CHEST WITH CONTRAST TECHNIQUE: Multidetector CT imaging of the chest was performed using the standard protocol during bolus administration of intravenous contrast. Multiplanar CT image reconstructions and MIPs were obtained to evaluate the vascular anatomy. CONTRAST:  147m OMNIPAQUE IOHEXOL 350 MG/ML SOLN COMPARISON:  02/28/2017 FINDINGS: Cardiovascular: Normal  heart size. No pericardial effusion. Advanced atheromatous calcification of the aorta and coronaries. The apical chest was excluded from the field of view on the CTA phase but was covered on a subsequent phase. Bulky calcification causes high-grade narrowing at the left subclavian artery. Mediastinum/Nodes: Negative for adenopathy or mass. Lungs/Pleura: Emphysema and airway thickening. Chronic bronchial at the right lower lobe after prior surgery. There is pleural based calcification and volume loss at the right lower lobe. Subpleural opacity in the right lower lobe not seen in 2018. Bilateral interlobular septal thickening. Upper Abdomen: Reported separately Musculoskeletal: No acute finding. Review of the MIP images confirms the above findings. IMPRESSION: 1. No evidence of pulmonary embolism. 2. Right lower lobe pneumonia or atelectasis. 3. Borderline interstitial edema 4. Advanced COPD with asymmetric right-sided scarring where there has been prior lung cancer resection. 5. Advanced atherosclerosis. Aortic Atherosclerosis (ICD10-I70.0) and Emphysema (ICD10-J43.9). Electronically Signed   By: JMonte FantasiaM.D.   On: 04/07/2020 07:08   CT ABDOMEN PELVIS W CONTRAST  Result Date: 04/07/2020 CLINICAL DATA:  Epigastric pain EXAM: CT ABDOMEN AND PELVIS WITH CONTRAST TECHNIQUE: Multidetector CT imaging of the abdomen and pelvis was performed using the standard protocol following bolus administration of intravenous contrast. CONTRAST:  1091mOMNIPAQUE IOHEXOL 350 MG/ML SOLN COMPARISON:  01/01/2020 FINDINGS: Lower chest:  Reported on dedicated chest CT. Hepatobiliary: Possible steatosis of the liver.At least 1 calcified gallstone. No findings of cholecystitis. No bile duct dilatation. Pancreas: Unremarkable. Spleen: Unremarkable. Adrenals/Urinary Tract: Negative adrenals. No hydronephrosis or stone. Unremarkable bladder. Stomach/Bowel: Rectocele appearance. No bowel obstruction or visible inflammation. Numerous  left colonic diverticula. Negative appendix. Vascular/Lymphatic: Diffuse atheromatous calcification with multiple narrowed visceral arteries, not well assessed on this study. No mass or adenopathy. Reproductive:Hysterectomy.  Pelvic floor laxity Other: Loculated fluid in the low left pelvis, likely peritoneal inclusion cyst given stability from prior. Musculoskeletal: Remarkably severe right hip osteoarthritis with femoral head collapse and fragmentation. Generalized osteopenia and advanced spinal degeneration. IMPRESSION: 1. Motion degraded CT without acute finding. 2. Numerous chronic findings are stable from July 2021 and described above. Electronically Signed   By: JoMonte Fantasia.D.   On: 04/07/2020 07:20     Assessment and Plan:   NSTEMI - Presented with nausea, vomiting, and abdominal and then and developed chest pain after vomiting in the ED. - EKG shows no acute ischemic changes.  - High-sensitivity troponin peaked at 3,454 and is down-trending. - Chest CTA negative for PED but showed advanced calcification of aorta and coronaries and high-grade narrowing of left subclavian artery. - Echo showed LVEF of 45-50% with global hypokinesis and grade I diastolic dysfunction. EF down from 60-65%  in 2019. - She had isolated episode of chest pain in the ED after vomiting. Currently chest pain free.  - Continue IV Heparin.  - Start Aspirin 29m daily. Will discuss adding beta-blocker with MD given severe COPD. Recommend restarting statin when LFTs return to baseline. - Given calcification noted on chest CTA, patient likes has significant CAD. However, it also may be best to to treat medically given age and unsteadiness on feet, alcohol abuse, and medication non-compliance. Will discuss with MD.   Abdominal Pain Nausea/Vomiting - Abdominal/pelvic CT showed no acute findings. - Lipase normal. - AST and Alk Phos elevated (chronic problem). Likely due to alcohol abuse. - Management per primary  team.  Seizures - Felt to be due to severe hyponatremia right now. May also be a component of alcohol withdrawal. - CT head without acute changes - Loaded with Keppra.  - Management per primary team.  COPD Exacerbation on 3L O2 at home Possible Pneumonia - Continue breathing treatments and antibiotics per primary team.  Hyperlipidemia - Lipid panel from 10/2019: Total Cholesterol 183, Triglycerides 69, HDL 87, LDL 81. - On Lipitor 242mdaily at home. Daughter states she takes this M/W/F. - May need to hold statin until LFTs return to baseline. Consider restarting at 4067mor high-intensity coverage.  Hypothyroidism - TSH elevated at 10.577. - Will check free T4 and free T3.  - Synthroid per primary team.  Severe Hyponatremia  - Sodium 119 on admission. Possibly from alcohol abuse and poor oral intake. - s/p 1L normal saline in the ED. - Urine sodium and urine osmolality normal.  - Management per primary team.  Hypokalemia Hypomagenesemia - Potassium 2.7 on admission. Repleted and now 3.5.  - Magnesium 1.3. Repleted. - Monitor closely.   For questions or updates, please contact [facility] consult [email or web address] for contact info under    CalSande RivesA Lortonatient seen and examined and agree with CalSande RivesA.  In brief, the patient is a 86 34ar old female with history of severe COPD/emphysema on 3L , small cell lung cancer s/p right upper lobectomy in 2012, alcohol abuse with history of withdrawals who presented with nausea, vomiting and abdominal pain found to have NSTEMI with trop 3265 as well as significant hyponatremia. ECG with sinus rhythm and frequent PACs. No significant ST-T wave changes.   After discussion with the patient, her daughter, and her sister given the patient's overall comorbidities, lack of chest pain or ECG changes, and Na of 119 at time of admission will proceed with medical management of her underlying NSTEMI. If she were to  develop worsening chest pain or ECG changes, we can consider more invasive measures if within goals of care.   Exam: General: Comfortable, tremors in bilateral upper extremities CV: RR, no murmurs Resp: Diffuse wheezing Abd: Soft, ND, NTTP Extremities: Warm, no edema Neuro: Tremor. AAOx3  Plan: -After discussions with the patient and her family, will manage NSTEMI medically at this time as patient is chest pain free, HD stable, and has no significant ST changes on ECG -If develops worsening chest pain or ECG changes, can consider invasive measures at that time -Continue heparin gtt -Continue ASA 30m102mily  -Start crestor 20mg33mly; will watch LFTs closely -Will hold on BB for now given significant COPD and active wheezing on exam -Plan to start losartan tomorrow pending blood pressure -Management of hyponatremia per primary team  HeathGwyndolyn Kaufman

## 2020-04-07 NOTE — Progress Notes (Signed)
ANTICOAGULATION CONSULT NOTE - Initial Consult  Pharmacy Consult for heparin Indication: ACS/STEMI  Allergies  Allergen Reactions  . Ampicillin Diarrhea and Nausea Only    GI upset  . Azithromycin Diarrhea and Nausea And Vomiting  . Codeine Nausea And Vomiting and Other (See Comments)    Makes her "crazy"  . Daliresp [Roflumilast] Nausea Only  . Nitrofurantoin Diarrhea and Nausea Only  . Other Other (See Comments)    No nuts and seeds because of diverticulitis  . Penicillins Nausea And Vomiting, Swelling and Rash    Has patient had a PCN reaction causing immediate rash, facial/tongue/throat swelling, SOB or lightheadedness with hypotension: Yes Has patient had a PCN reaction causing severe rash involving mucus membranes or skin necrosis: No Has patient had a PCN reaction that required hospitalization No Has patient had a PCN reaction occurring within the last 10 years: No No "cillins" If all of the above answers are "NO", then may proceed with Cephalosporin use.   . Sulfonamide Derivatives Diarrhea and Nausea And Vomiting  . Tiotropium Bromide Monohydrate     Urinary retention  . Cephalosporins Nausea And Vomiting    IV seems to be ok  . Doxycycline Nausea Only  . Protonix [Pantoprazole] Nausea Only  . Fludrocortisone Acetate Itching  . Lactose Intolerance (Gi) Other (See Comments)    Gas and bloating   . Latex Itching and Rash  . Prednisone Other (See Comments)    Has tolerated Prednisone per daughter    Patient Measurements: Height: 5\' 6"  (167.6 cm) Weight: 60.4 kg (133 lb 2.5 oz) IBW/kg (Calculated) : 59.3 Heparin Dosing Weight: 60.4kg  Vital Signs: Temp: 98.8 F (37.1 C) (10/19 0248) Temp Source: Oral (10/19 0248) BP: 122/108 (10/19 0430) Pulse Rate: 106 (10/19 0430)  Labs: Recent Labs    04/07/20 0306 04/07/20 0319  HGB 10.3*  --   HCT 32.1*  --   PLT 188  --   CREATININE 0.66  --   TROPONINIHS  --  3,265*    Estimated Creatinine Clearance: 49  mL/min (by C-G formula based on SCr of 0.66 mg/dL).   Medical History: Past Medical History:  Diagnosis Date  . Allergic rhinitis   . Anxiety   . Arthritis    "back, hands; hips" (12/22/2014)  . Asthma   . Bladder atony   . Chronic bronchitis ([place])   . COPD (chronic obstructive pulmonary disease) ([place])   . Diarrhea   . Emphysema of lung ([place])   . GERD (gastroesophageal reflux disease)    "w/spicey foods" (12/22/2014)  . Hyperlipidemia   . Non-small cell carcinoma of lung, stage 1 ([place]) 2005   1.4 cm Poorly differentiated Squamous cell RUL  T1N0 resected 09/22/2003  . On home oxygen therapy    "3L q night and prn during the day" (12/22/2014)  . Pelvic cyst   . Pneumonia    "this is the 3rd time that I can remember" (12/22/2014)  . Unspecified hypothyroidism      Assessment: 86 yo female came in complaining of N/V and dyspnea.  Pt had a generalized seizure while in the x-ray dept.  Pharmacy consulted to dose heparin for ACS/ STEMI.  No prior AC noted  04/07/2020  Hgb 10.3 Plts WNL Baseline PTT, PT/INR ordered   Goal of Therapy:  Heparin level 0.3-0.7 units/ml Monitor platelets by anticoagulation protocol   Plan:  Heparin bolus 3000 units then start heparin drip at 700 units/hr Heparin level in 8 hours Daily CBC  Dolly Rias  RPh 04/07/2020, 4:57 AM

## 2020-04-07 NOTE — Progress Notes (Addendum)
Subjective: Patient admitted this morning, see detailed H&P by Dr Hal Hope 86 year old female with history of COPD on 3 L of oxygen at home, alcohol abuse, hypothyroidism came to ED with nausea and vomiting.  Patient had generalized tonic-clonic seizure in the ED.  She was found to have significant lab abnormalities including hypomagnesemia, magnesium 1.3, hypokalemia potassium 2.7, hyponatremia sodium 119.  BNP was 404, troponin elevated to 3200.  Covid test was negative.  Chest x-ray showed possible infiltrate.  Patient was started on heparin infusion for NSTEMI and also received Keppra for seizure. Patient also started on ceftriaxone for possible pneumonia  Vitals:   04/07/20 0726 04/07/20 0800  BP: (!) 153/99 (!) 151/97  Pulse: (!) 106 (!) 102  Resp: (!) 27 (!) 29  Temp:    SpO2: 100% 100%      A/P NSTEMI Seizure-alcohol withdrawal versus secondary to metabolic abnormalities COPD exacerbation Alcohol withdrawal Hyponatremia Hypokalemia Hypomagnesemia Intractable nausea and vomiting  NSTEMI-patient started on heparin infusion, cardiology has been consulted.  Continue aspirin.  Seizure-likely from alcohol withdrawal versus metabolic abnormalities.  Sodium is 119, check BMP every 4 hours, replace magnesium and potassium.  Will recheck magnesium at 2 PM.  Called and discussed with neurologist, Dr. Leonel Ramsay who does not recommend to continue with Keppra at this time.  He recommends to obtain EEG and if she does not improve consider MRI brain.  Continue Ativan as needed for seizures.  Alcohol withdrawal-patient drinks alcohol daily, unsure about her last drink.  She has been started on CIWA protocol.  Continue thiamine and folate.  Pneumonia-CT chest obtained is negative for PE, shows right lower lobe pneumonia or atelectasis.  Hyponatremia-sodium is 119, will obtain serum osmolality, urine osmolality.  Urine sodium is pending.  Patient given IV fluid bolus in the ED.  Follow BMP  every 4 hours x3.  Will limit free water intake, patient is currently NPO.  COPD exacerbation-continue Xopenex every 6 hours  Hypothyroidism-TSH is elevated at 10.57, patient is on Synthroid 88 mcg at home.  Will increase the dose of Synthroid to 100 mcg daily.    Intractable nausea and vomiting-unclear etiology, CT abdomen/pelvis is unremarkable.  Lipase 23.  Continue Zofran as needed for nausea vomiting.    [place] Hospitalist [phone]

## 2020-04-07 NOTE — ED Notes (Addendum)
CRITICAL VALUE ALERT  Critical Value:  Sodium 119  Date & Time Notied: 04/07/2020 1453  Provider Notified:Lama, MD  Orders Received/Actions taken: MD acknowledged

## 2020-04-07 NOTE — Procedures (Signed)
Patient Name: [name]  MRN: [id number]  Epilepsy Attending: Lora Havens  Referring Physician/Provider: Dr Gean Birchwood Date: 04/07/2020  Duration: 24.05 mins  Patient history: 86yo F with seizures in setting of hyponatremia. EEG to evaluate for seizure  Level of alertness: Awake  AEDs during EEG study: None  Technical aspects: This EEG study was done with scalp electrodes positioned according to the 10-20 International system of electrode placement. Electrical activity was acquired at a sampling rate of 500Hz  and reviewed with a high frequency filter of 70Hz  and a low frequency filter of 1Hz . EEG data were recorded continuously and digitally stored.   Description: The posterior dominant rhythm consists of 9-10 Hz activity of moderate voltage (25-35 uV) seen predominantly in posterior head regions, symmetric and reactive to eye opening and eye closing. There is also an excessive amount of 15 to 18 Hz beta activity distributed symmetrically and diffusely.  Hyperventilation and photic stimulation were not performed.     ABNORMALITY -Excessive beta, generalized  IMPRESSION: This study is within normal limits. No seizures or epileptiform discharges were seen throughout the recording. The excessive beta activity seen in the background is most likely due to the effect of benzodiazepine and is a benign EEG pattern.   [name]

## 2020-04-07 NOTE — ED Notes (Signed)
Date and time results received: 04/07/20 0433  (use smartphrase ".now" to insert current time)  Test: troponin Critical Value: 3265  Name of Provider Notified: Roxanne Mins, MD   Orders Received? Or Actions Taken?: Orders Received - See Orders for details

## 2020-04-07 NOTE — ED Notes (Signed)
Date and time results received: 04/07/20 0643 (use smartphrase ".now" to insert current time)  Test: Troponin, Na Critical Value: 3454; 119  Name of Provider Notified: Dr.Kakrakandy  Orders Received? Or Actions Taken?:

## 2020-04-08 DIAGNOSIS — I214 Non-ST elevation (NSTEMI) myocardial infarction: Secondary | ICD-10-CM | POA: Diagnosis not present

## 2020-04-08 DIAGNOSIS — J449 Chronic obstructive pulmonary disease, unspecified: Secondary | ICD-10-CM | POA: Diagnosis not present

## 2020-04-08 DIAGNOSIS — R748 Abnormal levels of other serum enzymes: Secondary | ICD-10-CM | POA: Diagnosis not present

## 2020-04-08 LAB — BASIC METABOLIC PANEL
Anion gap: 11 (ref 5–15)
BUN: <5 mg/dL — ABNORMAL LOW (ref 8–23)
CO2: 23 mmol/L (ref 22–32)
Calcium: 8.1 mg/dL — ABNORMAL LOW (ref 8.9–10.3)
Chloride: 86 mmol/L — ABNORMAL LOW (ref 98–111)
Creatinine, Ser: 0.72 mg/dL (ref 0.44–1.00)
GFR, Estimated: >60 mL/min (ref >60)
Glucose, Bld: 118 mg/dL — ABNORMAL HIGH (ref 70–99)
Potassium: 3.9 mmol/L (ref 3.5–5.1)
Sodium: 120 mmol/L — ABNORMAL LOW (ref 135–145)

## 2020-04-08 LAB — CBC
HCT: 27.7 % — ABNORMAL LOW (ref 36.0–46.0)
Hemoglobin: 9.4 g/dL — ABNORMAL LOW (ref 12.0–15.0)
MCH: 31.9 pg (ref 26.0–34.0)
MCHC: 33.9 g/dL (ref 30.0–36.0)
MCV: 93.9 fL (ref 80.0–100.0)
Platelets: 146 10*3/uL — ABNORMAL LOW (ref 150–400)
RBC: 2.95 MIL/uL — ABNORMAL LOW (ref 3.87–5.11)
RDW: 12.6 % (ref 11.5–15.5)
WBC: 10.7 10*3/uL — ABNORMAL HIGH (ref 4.0–10.5)
nRBC: 0.2 % (ref 0.0–0.2)

## 2020-04-08 LAB — HEPARIN LEVEL (UNFRACTIONATED): Heparin Unfractionated: 0.6 IU/mL (ref 0.30–0.70)

## 2020-04-08 MED ORDER — FLUTICASONE PROPIONATE HFA 110 MCG/ACT IN AERO
2.0000 | INHALATION_SPRAY | Freq: Two times a day (BID) | RESPIRATORY_TRACT | Status: DC
  Administered 2020-04-08 – 2020-04-16 (×18): 2 via RESPIRATORY_TRACT
  Filled 2020-04-08: qty 12

## 2020-04-08 MED ORDER — SODIUM CHLORIDE 0.9 % IV SOLN
500.0000 mg | INTRAVENOUS | Status: DC
  Administered 2020-04-08 – 2020-04-10 (×3): 500 mg via INTRAVENOUS
  Filled 2020-04-08 (×3): qty 500

## 2020-04-08 MED ORDER — DEXTROSE 5 % IV SOLN: 250.0000 mg | INTRAVENOUS | Status: DC

## 2020-04-08 MED ORDER — METHYLPREDNISOLONE SODIUM SUCC 40 MG IJ SOLR
40.0000 mg | Freq: Two times a day (BID) | INTRAMUSCULAR | Status: DC
  Administered 2020-04-08 – 2020-04-15 (×14): 40 mg via INTRAVENOUS
  Filled 2020-04-08 (×14): qty 1

## 2020-04-08 MED ORDER — ONDANSETRON HCL 4 MG/2ML IJ SOLN
4.0000 mg | Freq: Three times a day (TID) | INTRAMUSCULAR | Status: DC | PRN
  Administered 2020-04-08 (×2): 4 mg via INTRAVENOUS
  Filled 2020-04-08 (×2): qty 2

## 2020-04-08 NOTE — TOC Initial Note (Signed)
Transition of Care [facility]) - Initial/Assessment Note    Patient Details  Name: [name] MRN: [id number] Date of Birth: 1933-01-27  Transition of [facility]) CM/SW Contact:    Leeroy Cha, RN Phone Number: 04/08/2020, 8:25 AM  Clinical Narrative:                  86 y.o. female with history of COPD on 3 L oxygen at home with history of alcohol abuse and hypothyroidism presented to the ER because of worsening nausea vomiting since last night with abdominal discomfort.  Patient also became progressively short of breath after vomiting started.  When patient initially came to the ER did not complain of any chest pain but later has been having some abdominal discomfort mainly in the epigastric area.  Was not eating well for the last couple of days because of the nausea.  Drinks brandy every night.  ED Course: In the ER patient was short of breath tachycardic and wheezing.  While in the ER patient had a brief episode of generalized tonic-clonic seizure for which patient was given Keppra 1 g loading dose of CT head was unremarkable.  Seizure lasted less than a minute following which patient was postictal but improved quickly.  EKG shows sinus tachycardia labs are remarkable for sodium of 119 potassium 2.7 glucose 177 alkaline phosphatase 163 AST 103 total bilirubin 1.3 BNP 404 high sensitive troponin of 3200 WBC 13.1 Covid test negative chest x-ray shows possible infiltrates.  Patient was started on heparin infusion for non-ST elevation MI after aspirin was given admitted for non-ST elevation MI with severe hyponatremia seizure and nausea vomiting with abdominal discomfort. o2 aqt 6l/min via [place], iv ativan q6hr iv, iv thamine, iv rocephion, iv heparin, na =120. osmolatity down at 256. No cardiac meds at this time. Expected Discharge Plan: Home/Self Care Barriers to Discharge: Barriers Unresolved (comment)   Patient Goals and CMS Choice Patient states their goals for this hospitalization  and ongoing recovery are:: to go back to my home CMS Medicare.gov Compare Post Acute Care list provided to:: Patient    Expected Discharge Plan and Services Expected Discharge Plan: Home/Self Care   Discharge Planning Services: CM Consult   Living arrangements for the past 2 months: Single Family Home                                      Prior Living Arrangements/Services Living arrangements for the past 2 months: Single Family Home Lives with:: Spouse   Do you feel safe going back to the place where you live?: Yes      Need for Family Participation in Patient Care: Yes (Comment) Care giver support system in place?: Yes (comment)   Criminal Activity/Legal Involvement Pertinent to Current Situation/Hospitalization: No - Comment as needed  Activities of Daily Living Home Assistive Devices/Equipment: Environmental consultant (specify type), Wheelchair, Eyeglasses, Nebulizer, Oxygen (front wheeled walker) ADL Screening (condition at time of admission) Patient's cognitive ability adequate to safely complete daily activities?: Yes Is the patient deaf or have difficulty hearing?: Yes Does the patient have difficulty seeing, even when wearing glasses/contacts?: No Does the patient have difficulty concentrating, remembering, or making decisions?: No Patient able to express need for assistance with ADLs?: Yes Does the patient have difficulty dressing or bathing?: Yes Independently performs ADLs?: No Communication: Independent Dressing (OT): Needs assistance Is this a change from baseline?: Pre-admission baseline Grooming: Needs assistance  Is this a change from baseline?: Pre-admission baseline Feeding: Needs assistance Is this a change from baseline?: Pre-admission baseline Bathing: Needs assistance Is this a change from baseline?: Pre-admission baseline Toileting: Needs assistance Is this a change from baseline?: Pre-admission baseline In/Out Bed: Needs assistance Is this a change from  baseline?: Pre-admission baseline Walks in Home: Dependent Is this a change from baseline?: Pre-admission baseline Does the patient have difficulty walking or climbing stairs?: Yes (secondary to weakness) Weakness of Legs: Both Weakness of Arms/Hands: None  Permission Sought/Granted                  Emotional Assessment Appearance:: Appears stated age Attitude/Demeanor/Rapport: Engaged Affect (typically observed): Calm Orientation: : Oriented to Self, Oriented to Place, Oriented to  Time, Oriented to Situation Alcohol / Substance Use: Alcohol Use Psych Involvement: No (comment)  Admission diagnosis:  Hypokalemia [E87.6] Hypomagnesemia [E83.42] Hyponatremia [E87.1] Seizure (HCC) [R56.9] Alkaline phosphatase elevation [R74.8] Non-STEMI (non-ST elevated myocardial infarction) (HCC) [I21.4] Non-ST elevation MI (NSTEMI) (HCC) [I21.4] Normochromic normocytic anemia [D64.9] Elevated AST (SGOT) [R74.01] Elevated d-dimer [R79.89] Patient Active Problem List   Diagnosis Date Noted  . Non-ST elevation MI (NSTEMI) ([place]) 04/07/2020  . Nausea & vomiting 04/07/2020  . Hyponatremia 04/07/2020  . Alcohol abuse 04/07/2020  . Sialadenitis 03/26/2020  . Arm wound, left, initial encounter 01/15/2020  . Open knee wound, right, initial encounter 01/15/2020  . Diarrhea 01/01/2020  . Blood in stool 01/01/2020  . Left lower quadrant abdominal pain 01/01/2020  . Dark stools 11/05/2019  . GERD (gastroesophageal reflux disease) 11/05/2019  . Hypomagnesemia 10/14/2019  . Skin tear of left upper arm without complication 59/93/5701  . COPD with acute exacerbation ([place]) 05/22/2019  . Urinary incontinence due to immobility 05/22/2019  . Non-healing wound of lower extremity 05/03/2019  . Multiple skin tears 03/02/2019  . Weight loss 11/20/2018  . Depression 11/20/2018  . Recurrent falls 11/20/2018  . Parotiditis 09/17/2018  . Primary osteoarthritis of right hip 06/21/2018  . Degenerative disc  disease, cervical 12/06/2017  . DOE (dyspnea on exertion) 11/21/2017  . Bilateral swelling of feet 11/02/2017  . Osteopenia 02/16/2017  . Hyperglycemia 12/08/2016  . Degenerative arthritis of right knee 10/27/2016  . 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 Greater trochanteric bursitis of both hips 04/08/2016  . Pain in both knees 04/08/2016  . Chronic midline low back pain without sciatica 04/08/2016  . Chronic respiratory failure with hypoxia ([place]) 03/07/2015  . Diverticulitis 05/24/2014  . Anxiety   . Bladder prolapse, female, acquired   . Non-small cell carcinoma of lung, stage 1 ([place]) 01/10/2012  . Neuropathy, peripheral 01/28/2011  . COPD mixed type ([place]) 09/08/2010  . Dyslipidemia   . ATONY OF BLADDER 08/10/2009  . Hypothyroidism 03/12/2008  . Seasonal allergic rhinitis 10/10/2007   PCP:  Binnie Rail, MD Pharmacy:   [id number] - [address], [place] [address] Phone: [phone] Fax: [phone]  [employer], [address] [place] [place] [address] Phone: [phone] Fax: [phone]     Social Determinants of Health (SDOH) Interventions    Readmission Risk Interventions No flowsheet data found.

## 2020-04-08 NOTE — Progress Notes (Signed)
[place] for heparin Indication: ACS/STEMI  Allergies  Allergen Reactions  . Ampicillin Diarrhea and Nausea Only    GI upset  . Azithromycin Diarrhea and Nausea And Vomiting  . Codeine Nausea And Vomiting and Other (See Comments)    Makes her "crazy"  . Daliresp [Roflumilast] Nausea Only  . Nitrofurantoin Diarrhea and Nausea Only  . Other Other (See Comments)    No nuts and seeds because of diverticulitis  . Penicillins Nausea And Vomiting, Swelling and Rash    Has patient had a PCN reaction causing immediate rash, facial/tongue/throat swelling, SOB or lightheadedness with hypotension: Yes Has patient had a PCN reaction causing severe rash involving mucus membranes or skin necrosis: No Has patient had a PCN reaction that required hospitalization No Has patient had a PCN reaction occurring within the last 10 years: No No "cillins" If all of the above answers are "NO", then may proceed with Cephalosporin use.   . Sulfonamide Derivatives Diarrhea and Nausea And Vomiting  . Tiotropium Bromide Monohydrate     Urinary retention  . Cephalosporins Nausea And Vomiting    IV seems to be ok  . Doxycycline Nausea Only  . Protonix [Pantoprazole] Nausea Only  . Fludrocortisone Acetate Itching  . Lactose Intolerance (Gi) Other (See Comments)    Gas and bloating   . Latex Itching and Rash  . Prednisone Other (See Comments)    Has tolerated Prednisone per daughter    Patient Measurements: Height: 5\' 6"  (167.6 cm) Weight: 60.4 kg (133 lb 2.5 oz) IBW/kg (Calculated) : 59.3 Heparin Dosing Weight: 60.4kg  Vital Signs: Temp: 97.8 F (36.6 C) (10/20 0800) Temp Source: Axillary (10/20 0800) BP: 123/61 (10/20 1200) Pulse Rate: 104 (10/20 1200)  Labs: Recent Labs    04/07/20 0306 04/07/20 0306 04/07/20 0319 04/07/20 0527 04/07/20 0527 04/07/20 1047 04/07/20 1322 04/07/20 1334 04/07/20 2140 04/08/20 0529  HGB 10.3*  --   --   --   --   --    --   --   --  9.4*  HCT 32.1*  --   --   --   --   --   --   --   --  27.7*  PLT 188  --   --   --   --   --   --   --   --  146*  APTT  --   --   --  33  --   --   --   --   --   --   LABPROT  --   --   --  14.9  --   --   --   --   --   --   INR  --   --   --  1.2  --   --   --   --   --   --   HEPARINUNFRC  --   --   --   --   --   --  0.54  --  0.55 0.60  CREATININE 0.66   < >  --  0.65   < > 0.52  --  0.60  --  0.72  TROPONINIHS  --   --  3,265* 3,454*  --  2,165*  --   --   --   --    < > = values in this interval not displayed.    Estimated Creatinine Clearance: 49 mL/min (by C-G formula based  on SCr of 0.72 mg/dL).   Assessment: 86 yo female came in c/o N/V, dyspnea.  Pt had a generalized seizure while in the x-ray dept.  Pharmacy consulted to dose heparin for ACS/ STEMI.  No prior AC noted  Today, 04/08/2020:  AM heparin level = 0.60> therapeutic on 700 units/hr  SCr stable WNL - at baseline  No bleeding or infusion issues per RN  HG 10.3>9.4, PLTC 188> 146    Goal of Therapy:  Heparin level 0.3-0.7 units/ml Monitor platelets by anticoagulation protocol   Plan:   Continue heparin infusion at 700 units/hr  Daily CBC and heparin level  Monitor for s/s bleeding or thrombosis  F/u length of therapy/cards recs  Eudelia Bunch, Pharm.D 04/08/2020 1:47 PM

## 2020-04-08 NOTE — Progress Notes (Signed)
Progress Note  Patient Name: [name] Date of Encounter: 04/08/2020  [facility] Cardiologist: No primary care provider on file.   Subjective  Concern for aspiration this morning. Now made NPO. Sleeping this afternoon, has audible rhonchi and wheezing.  Na 120 this AM, K now 3.9 Net positive 1.165 L  Inpatient Medications    Scheduled Meds: . aspirin  81 mg Oral Daily  . Chlorhexidine Gluconate Cloth  6 each Topical Daily  . fluticasone  2 puff Inhalation BID  . folic acid  1 mg Oral Daily  . levalbuterol  1.25 mg Nebulization TID  . levothyroxine  100 mcg Oral Q0600  . LORazepam  0-4 mg Intravenous Q6H   Followed by  . [START ON 04/09/2020] LORazepam  0-4 mg Intravenous Q12H  . mouth rinse  15 mL Mouth Rinse BID  . methylPREDNISolone (SOLU-MEDROL) injection  40 mg Intravenous Q12H  . multivitamin with minerals  1 tablet Oral Daily  . rosuvastatin  20 mg Oral Daily  . thiamine  100 mg Oral Daily   Or  . thiamine  100 mg Intravenous Daily   Continuous Infusions: . sodium chloride Stopped (04/08/20 0539)  . azithromycin    . cefTRIAXone (ROCEPHIN)  IV Stopped (04/08/20 0610)  . heparin 700 Units/hr (04/08/20 0600)   PRN Meds: levalbuterol, LORazepam **OR** LORazepam, morphine injection, ondansetron (ZOFRAN) IV   Vital Signs    Vitals:   04/08/20 0700 04/08/20 0800 04/08/20 0851 04/08/20 1200  BP: (!) 164/80 (!) 144/74  123/61  Pulse: (!) 106 100  (!) 104  Resp: (!) 30 (!) 26  (!) 26  Temp:  97.8 F (36.6 C)    TempSrc:  Axillary    SpO2: 99% 90% 98% (!) 88%  Weight:      Height:        Intake/Output Summary (Last 24 hours) at 04/08/2020 1435 Last data filed at 04/08/2020 0600 Gross per 24 hour  Intake 976.85 ml  Output 300 ml  Net 676.85 ml   Last 3 Weights 04/07/2020 03/12/2020 02/26/2020  Weight (lbs) 133 lb 2.5 oz 133 lb 3.2 oz (No Data)  Weight (kg) 60.4 kg 60.419 kg (No Data)      Telemetry    Sinus tachycardia with HR 100-110s;  PACs - Personally Reviewed  ECG    No new ECGs this AM - Personally Reviewed  Physical Exam   GEN: Sleeping, diffuse audible rhonchi and wheezing  Neck: +JVD Cardiac: Tachycardic, regular, no murmur Respiratory: Tachpneic, diffuse, loud rhonchi and wheezing GI: Soft, nontender, non-distended  MS: Warm, no edema Neuro:  Nonfocal  Psych: Normal affect   Labs    High Sensitivity Troponin:   Recent Labs  Lab 04/07/20 0319 04/07/20 0527 04/07/20 1047  TROPONINIHS 3,265* 3,454* 2,165*      Chemistry Recent Labs  Lab 04/07/20 0306 04/07/20 0306 04/07/20 0527 04/07/20 0527 04/07/20 1047 04/07/20 1334 04/08/20 0529  NA 119*   < > 119*   < > 120* 119* 120*  K 2.7*   < > 3.0*   < > 3.5 3.8 3.9  CL 82*   < > 83*   < > 90* 89* 86*  CO2 17*   < > 22   < > 23 24 23   GLUCOSE 177*   < > 116*   < > 97 103* 118*  BUN 6*   < > <5*   < > <5* <5* <5*  CREATININE 0.66   < > 0.65   < >  0.52 0.60 0.72  CALCIUM 8.0*   < > 7.7*   < > 6.9* 7.5* 8.1*  PROT 6.5  --  5.8*  --   --   --   --   ALBUMIN 3.0*  --  2.5*  --   --   --   --   AST 103*  --  98*  --   --   --   --   ALT 39  --  34  --   --   --   --   ALKPHOS 163*  --  138*  --   --   --   --   BILITOT 1.1  --  1.0  --   --   --   --   GFRNONAA >60   < > >60   < > >60 >60 >60  ANIONGAP 20*   < > 14   < > 7 6 11    < > = values in this interval not displayed.     Hematology Recent Labs  Lab 04/07/20 0306 04/08/20 0529  WBC 13.1* 10.7*  RBC 3.24* 2.95*  HGB 10.3* 9.4*  HCT 32.1* 27.7*  MCV 99.1 93.9  MCH 31.8 31.9  MCHC 32.1 33.9  RDW 12.5 12.6  PLT 188 146*    BNP Recent Labs  Lab 04/07/20 0320  BNP 404.1*     DDimer  Recent Labs  Lab 04/07/20 0340  DDIMER 2.91*     Radiology    EEG  Result Date: 04/07/2020 Lora Havens, MD     04/07/2020  5:21 PM Patient Name: [name] MRN: [id number] Epilepsy Attending: Lora Havens Referring Physician/Provider: Dr Gean Birchwood Date:  04/07/2020 Duration: 24.05 mins Patient history: 86yo F with seizures in setting of hyponatremia. EEG to evaluate for seizure Level of alertness: Awake AEDs during EEG study: None Technical aspects: This EEG study was done with scalp electrodes positioned according to the 10-20 International system of electrode placement. Electrical activity was acquired at a sampling rate of 500Hz  and reviewed with a high frequency filter of 70Hz  and a low frequency filter of 1Hz . EEG data were recorded continuously and digitally stored. Description: The posterior dominant rhythm consists of 9-10 Hz activity of moderate voltage (25-35 uV) seen predominantly in posterior head regions, symmetric and reactive to eye opening and eye closing. There is also an excessive amount of 15 to 18 Hz beta activity distributed symmetrically and diffusely.  Hyperventilation and photic stimulation were not performed.   ABNORMALITY -Excessive beta, generalized IMPRESSION: This study is within normal limits. No seizures or epileptiform discharges were seen throughout the recording. The excessive beta activity seen in the background is most likely due to the effect of benzodiazepine and is a benign EEG pattern. Lora Havens   DG Chest 2 View  Result Date: 04/07/2020 CLINICAL DATA:  86 year old female with shortness of breath.  COPD. EXAM: CHEST - 2 VIEW COMPARISON:  03/12/2020 chest radiographs and earlier. FINDINGS: Upright AP and lateral views. 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 Right lung base calcified pleural plaques demonstrated by CT in 2018. Mediastinal contours are stable. No pneumothorax. But there is increased reticulonodular opacity in the right lower lung compared to March of this year. Left lung appears stable. Calcified aortic atherosclerosis. Osteopenia. No acute osseous abnormality identified. Paucity of bowel gas in the upper abdomen. IMPRESSION: 1. Chronic lung  disease with increased reticulonodular opacity in the right lower  lung compatible with acute infectious exacerbation. 2. Aortic Atherosclerosis (ICD10-I70.0) and Emphysema (ICD10-J43.9). Electronically Signed   By: Genevie Ann M.D.   On: 04/07/2020 03:05   CT Head Wo Contrast  Result Date: 04/07/2020 CLINICAL DATA:  Nontraumatic seizure EXAM: CT HEAD WITHOUT CONTRAST TECHNIQUE: Contiguous axial images were obtained from the base of the skull through the vertex without intravenous contrast. COMPARISON:  01/08/2020 FINDINGS: Brain: No evidence of acute infarction, hemorrhage, hydrocephalus, extra-axial collection or mass lesion/mass effect. Generalized atrophy with chronic small vessel ischemic low-density in the deep white matter. Vascular: No hyperdense vessel or unexpected calcification. Skull: Normal. Negative for fracture or focal lesion. Sinuses/Orbits: No acute finding.  Bilateral cataract resection. Other: Motion degraded. IMPRESSION: 1. Aging brain without acute superimposed finding. 2. Intermittent motion artifact. Electronically Signed   By: Monte Fantasia M.D.   On: 04/07/2020 04:23   CT Angio Chest PE W and/or Wo Contrast  Result Date: 04/07/2020 CLINICAL DATA:  Positive D-dimer.  Can not catch breath EXAM: CT ANGIOGRAPHY CHEST WITH CONTRAST TECHNIQUE: Multidetector CT imaging of the chest was performed using the standard protocol during bolus administration of intravenous contrast. Multiplanar CT image reconstructions and MIPs were obtained to evaluate the vascular anatomy. CONTRAST:  186mL OMNIPAQUE IOHEXOL 350 MG/ML SOLN COMPARISON:  02/28/2017 FINDINGS: Cardiovascular: Normal heart size. No pericardial effusion. Advanced atheromatous calcification of the aorta and coronaries. The apical chest was excluded from the field of view on the CTA phase but was covered on a subsequent phase. Bulky calcification causes high-grade narrowing at the left subclavian artery. Mediastinum/Nodes: Negative for  adenopathy or mass. Lungs/Pleura: Emphysema and airway thickening. Chronic bronchial at the right lower lobe after prior surgery. There is pleural based calcification and volume loss at the right lower lobe. Subpleural opacity in the right lower lobe not seen in 2018. Bilateral interlobular septal thickening. Upper Abdomen: Reported separately Musculoskeletal: No acute finding. Review of the MIP images confirms the above findings. IMPRESSION: 1. No evidence of pulmonary embolism. 2. Right lower lobe pneumonia or atelectasis. 3. Borderline interstitial edema 4. Advanced COPD with asymmetric right-sided scarring where there has been prior lung cancer resection. 5. Advanced atherosclerosis. Aortic Atherosclerosis (ICD10-I70.0) and Emphysema (ICD10-J43.9). Electronically Signed   By: Monte Fantasia M.D.   On: 04/07/2020 07:08   CT ABDOMEN PELVIS W CONTRAST  Result Date: 04/07/2020 CLINICAL DATA:  Epigastric pain EXAM: CT ABDOMEN AND PELVIS WITH CONTRAST TECHNIQUE: Multidetector CT imaging of the abdomen and pelvis was performed using the standard protocol following bolus administration of intravenous contrast. CONTRAST:  123mL OMNIPAQUE IOHEXOL 350 MG/ML SOLN COMPARISON:  01/01/2020 FINDINGS: Lower chest:  Reported on dedicated chest CT. Hepatobiliary: Possible steatosis of the liver.At least 1 calcified gallstone. No findings of cholecystitis. No bile duct dilatation. Pancreas: Unremarkable. Spleen: Unremarkable. Adrenals/Urinary Tract: Negative adrenals. No hydronephrosis or stone. Unremarkable bladder. Stomach/Bowel: Rectocele appearance. No bowel obstruction or visible inflammation. Numerous left colonic diverticula. Negative appendix. Vascular/Lymphatic: Diffuse atheromatous calcification with multiple narrowed visceral arteries, not well assessed on this study. No mass or adenopathy. Reproductive:Hysterectomy.  Pelvic floor laxity Other: Loculated fluid in the low left pelvis, likely peritoneal inclusion  cyst given stability from prior. Musculoskeletal: Remarkably severe right hip osteoarthritis with femoral head collapse and fragmentation. Generalized osteopenia and advanced spinal degeneration. IMPRESSION: 1. Motion degraded CT without acute finding. 2. Numerous chronic findings are stable from July 2021 and described above. Electronically Signed   By: Monte Fantasia M.D.   On: 04/07/2020 07:20   ECHOCARDIOGRAM COMPLETE  Result Date: 04/07/2020    ECHOCARDIOGRAM REPORT   Patient Name:   AISLIN ONOFRE Date of Exam: 04/07/2020 Medical Rec #:  [id number]         Height:       66.0 in Accession #:    [id number]        Weight:       133.2 lb Date of Birth:  May 19, 1933        BSA:          1.682 m Patient Age:    31 years          BP:           109/70 mmHg Patient Gender: F                 HR:           97 bpm. Exam Location:  Inpatient Procedure: 2D Echo, Cardiac Doppler, Color Doppler and Intracardiac            Opacification Agent Indications:    R07.89 Other chest pain. Elevated troponin.  History:        Patient has prior history of Echocardiogram examinations, most                 recent 11/28/2017. COPD, Signs/Symptoms:Chest Pain, Shortness of                 Breath and Dyspnea; Risk Factors:Former Smoker and Dyslipidemia.                 Elevated troponin. Hypoxia. Lung cancer. ETOH.  Sonographer:    Roseanna Rainbow RDCS Referring Phys: [place]  Sonographer Comments: Technically challenging study due to limited acoustic windows, Technically difficult study due to poor echo windows, suboptimal parasternal window, suboptimal apical window and suboptimal subcostal window. Image acquisition challenging due to COPD and Image acquisition challenging due to respiratory motion. Patient having trouble breathing throughout exam. Extremely difficult and limited study. IMPRESSIONS  1. Left ventricular ejection fraction, by estimation, is 45 to 50%. The left ventricle has mildly decreased function. The  left ventricle demonstrates global hypokinesis. Left ventricular diastolic parameters are consistent with Grade I diastolic dysfunction (impaired relaxation).  2. Right ventricular systolic function was not well visualized. The right ventricular size is not well visualized.  3. The mitral valve was not well visualized. No evidence of mitral valve regurgitation.  4. The aortic valve was not well visualized. Aortic valve regurgitation is not visualized.  5. The inferior vena cava is dilated in size with <50% respiratory variability, suggesting right atrial pressure of 15 mmHg. Comparison(s): No prior Echocardiogram. Conclusion(s)/Recommendation(s): Difficult study even with echo constrast. FINDINGS  Left Ventricle: Left ventricular ejection fraction, by estimation, is 45 to 50%. The left ventricle has mildly decreased function. The left ventricle demonstrates global hypokinesis. Definity contrast agent was given IV to delineate the left ventricular  endocardial borders. The left ventricular internal cavity size was normal in size. There is no left ventricular hypertrophy. Left ventricular diastolic parameters are consistent with Grade I diastolic dysfunction (impaired relaxation). Right Ventricle: The right ventricular size is not well visualized. Right vetricular wall thickness was not well visualized. Right ventricular systolic function was not well visualized. Left Atrium: Left atrial size was normal in size. Right Atrium: Right atrial size was not well visualized. Pericardium: There is no evidence of pericardial effusion. Mitral Valve: The mitral valve was not well visualized. No evidence of mitral valve regurgitation. Tricuspid Valve: The  tricuspid valve is not well visualized. Tricuspid valve regurgitation is not demonstrated. Aortic Valve: The aortic valve was not well visualized. Aortic valve regurgitation is not visualized. Pulmonic Valve: The pulmonic valve was not well visualized. Pulmonic valve  regurgitation is not visualized. Aorta: The aortic root is normal in size and structure and the ascending aorta was not well visualized. Venous: The pulmonary veins were not well visualized. The inferior vena cava is dilated in size with less than 50% respiratory variability, suggesting right atrial pressure of 15 mmHg. IAS/Shunts: The interatrial septum was not well visualized.  LEFT VENTRICLE PLAX 2D LVIDd:         4.40 cm     Diastology LVIDs:         3.50 cm     LV e' medial:    5.55 cm/s LV PW:         0.90 cm     LV E/e' medial:  13.1 LV IVS:        1.10 cm     LV e' lateral:   9.57 cm/s LVOT diam:     1.70 cm     LV E/e' lateral: 7.6 LV SV:         23 LV SV Index:   14 LVOT Area:     2.27 cm  LV Volumes (MOD) LV vol d, MOD A2C: 63.9 ml LV vol d, MOD A4C: 91.8 ml LV vol s, MOD A2C: 30.3 ml LV vol s, MOD A4C: 49.8 ml LV SV MOD A2C:     33.7 ml LV SV MOD A4C:     91.8 ml LV SV MOD BP:      38.6 ml IVC IVC diam: 2.40 cm LEFT ATRIUM           Index LA diam:      3.10 cm 1.84 cm/m LA Vol (A4C): 33.6 ml 19.97 ml/m  AORTIC VALVE LVOT Vmax:   56.40 cm/s LVOT Vmean:  35.800 cm/s LVOT VTI:    0.103 m  AORTA Ao Root diam: 3.20 cm MITRAL VALVE MV Area (PHT): 4.06 cm    SHUNTS MV Decel Time: 187 msec    Systemic VTI:  0.10 m MV E velocity: 72.80 cm/s  Systemic Diam: 1.70 cm MV A velocity: 73.70 cm/s MV E/A ratio:  0.99 Rudean Haskell MD Electronically signed by Rudean Haskell MD Signature Date/Time: 04/07/2020/2:46:36 PM    Final     Cardiac Studies   TTE 04/07/20: 1. Left ventricular ejection fraction, by estimation, is 45 to 50%. The  left ventricle has mildly decreased function. The left ventricle  demonstrates global hypokinesis. Left ventricular diastolic parameters are  consistent with Grade I diastolic  dysfunction (impaired relaxation).  2. Right ventricular systolic function was not well visualized. The right  ventricular size is not well visualized.  3. The mitral valve was not  well visualized. No evidence of mitral valve  regurgitation.  4. The aortic valve was not well visualized. Aortic valve regurgitation  is not visualized.  5. The inferior vena cava is dilated in size with <50% respiratory  variability, suggesting right atrial pressure of 15 mmHg.   Patient Profile     86 y.o. female with history of severe COPD/emphysema on 3L [place], small cell lung cancer s/p right upper lobectomy in 2012, alcohol abuse with history of withdrawals who presented with nausea, vomiting and abdominal pain found to have NSTEMI with trop 3265 as well as significant hyponatremia. Now on medical management for NSTEMI.  Assessment & Plan  #NSTEMI: #CAD Patient with elevated troponin to 3454 in the setting of nausea, vomiting and Na 119. No active chest pain on our evaluation and ECG without evidence of ischemia. TTE with LVEF 40-45% but global hypokinesis. CTA with evidence of significant coronary disease, however, given overall comoridities and lack of chest pain, decision was made in coordination with the patient and her family to proceed with medical management at this time. -Continue heparin gtt for at least 48 hours for NSTEMI -Continue ASA 81mg  daily -Continue Crestor 20mg  daily -No beta blocker given severe COPD and active wheezing -Would benefit from ACE/ARB in the future -Pending clinical improvement, can proceed with stress testing in the future if within goals of care  #Systolic heart failure with LVEF 40-45%: Global hypokinesis. Has significant calcification of coronary arteries on CTA chest suggestive of underlying multivessel CAD. Given comorbidities as above, currently treating medically. Appears mildly hypervolemic however no diuresis for now given Na 120. -Holding diuresis for now; will need in future once Na improved -No BB due to severe COPD -Would benefit from ACE/ARB in the future -Managing NSTEMI as above  #Hyponatremia: #Alcohol abuse #Poor PO  intake Likely due to significant alcohol abuse as well as poor PO intake, nausea and vomiting. -Management per primary team -Holding diuretics for now as do not want to worsen hyponatremia; will need diuresis eventually once more stable  #COPD Exacerbation on 3L O2 at home #Pneumonia: -Management per primary team  #HLD: -Continue crestor 20mg  daily    For questions or updates, please contact [place] Please consult [email or web address] for contact info under        Signed, Freada Bergeron, MD  04/08/2020, 2:35 PM

## 2020-04-08 NOTE — Progress Notes (Signed)
Patient reports Nausea. On call hospitalist notified. Medication recommended. Awaiting orders.

## 2020-04-08 NOTE — Evaluation (Signed)
SLP Cancellation Note  Patient Details Name: [name] MRN: [id number] DOB: Nov 14, 1932   Cancelled treatment:       Reason Eval/Treat Not Completed: Other (comment) (note concerns for pt to possibly have pancreatitis and she is reporting nausea, will continue efforts)  Kathleen Lime, MS [place] [facility] [phone] Pager [phone]    [name] 04/08/2020, 1:42 PM

## 2020-04-08 NOTE — Progress Notes (Signed)
Paged on call hospitalist to ask for additional options for PRN breathing treatment options, as current interventions have not relieved symptoms. Patient still reports difficulty breathing.

## 2020-04-08 NOTE — Progress Notes (Signed)
PROGRESS NOTE    [name]  TKZ:[id number] DOB: 06-11-1933 DOA: 04/07/2020 PCP: Binnie Rail, MD    Brief Narrative:  86 year old female with history of COPD on 3 L of oxygen at home, alcohol abuse, hypothyroidism came to ED with nausea and vomiting.  Patient had generalized tonic-clonic seizure in the ED.  She was found to have significant lab abnormalities including hypomagnesemia, magnesium 1.3, hypokalemia potassium 2.7, hyponatremia sodium 119.  BNP was 404, troponin elevated to 3200.  Covid test was negative.  Chest x-ray showed possible infiltrate.   Assessment & Plan:   Principal Problem:   Non-ST elevation MI (NSTEMI) ([place]) Active Problems:   Hypothyroidism   COPD mixed type (HCC)   Non-small cell carcinoma of lung, stage 1 (HCC)   Chronic respiratory failure with hypoxia (HCC)   Nausea & vomiting   Hyponatremia   Alcohol abuse  NSTEMI -Cardiology was consulted -Pt is continued on heparin gtt and asa. Medical management per Cardiology noted  Seizure -likely from alcohol withdrawal versus metabolic abnormalities.   -Presenting sodium is 119, up to 120 today -EEG reviewed, unremarkable -Dr. Darrick Meigs discussed with neurology who did not recommend seizure meds  Alcohol withdrawal-patient drinks alcohol daily -Discussed with family in room. Last ETOH is believed to be on 10/17 -Continue with CIWA as tolerated -Suspect ETOH is related to below pna, suspect aspiration PNA  Pneumonia -Presenting CT chest obtained is negative for PE, however is pos for right lower lobe pneumonia, given presenting nausea/vomiting in the setting of ETOH abuse, consider aspiration PNA -Currently on rocephin monotherapy. Multiple allergies listed. Azithro "allergy" lists intolerance with n/v. Given acute illness, recommend adding azithromycin  Hyponatremia -Presenting sodium is 119 -Sodium up to 120 today -Given EtOH hx, consider component of beer potomania -Repeat bmet in  AM  COPD exacerbation -continue Xopenex every 6 hours -Wheezing on exam increased resp effort. Will add solumedrol  Hypothyroidism -Presenting TSH is elevated at 10.57, patient is on Synthroid 88 mcg at home.  -Synthroid was recently increased to 100 mcg daily.   -Will check free T4 -Consider element of sick euthyroid  Intractable nausea and vomiting --CT abdomen/pelvis is unremarkable.  Lipase 23.  Continue Zofran as needed for nausea vomiting. -suspect related to on-going ETOH abuse   DVT prophylaxis: Heparin gtt Code Status: DNR Family Communication: Pt in room, pt's daughter at bedside  Status is: Inpatient  Remains inpatient appropriate because:Hemodynamically unstable, Altered mental status, Unsafe d/c plan, IV treatments appropriate due to intensity of illness or inability to take PO and Inpatient level of care appropriate due to severity of illness   Dispo: The patient is from: Home              Anticipated d/c is to: Unclear. Will need PT eval when pt is more stable              Anticipated d/c date is: > 3 days              Patient currently is not medically stable to d/c.  Consultants:   Cardiology  Procedures:     Antimicrobials: Anti-infectives (From admission, onward)   Start     Dose/Rate Route Frequency Ordered Stop   04/07/20 0600  cefTRIAXone (ROCEPHIN) 1 g in sodium chloride 0.9 % 100 mL IVPB        1 g 200 mL/hr over 30 Minutes Intravenous Every 24 hours 04/07/20 0553         Subjective: Unable to assess  given current mentation  Objective: Vitals:   04/08/20 0700 04/08/20 0800 04/08/20 0851 04/08/20 1200  BP: (!) 164/80 (!) 144/74  123/61  Pulse: (!) 106 100  (!) 104  Resp: (!) 30 (!) 26  (!) 26  Temp:  97.8 F (36.6 C)    TempSrc:  Axillary    SpO2: 99% 90% 98% (!) 88%  Weight:      Height:        Intake/Output Summary (Last 24 hours) at 04/08/2020 1332 Last data filed at 04/08/2020 0600 Gross per 24 hour  Intake 976.85 ml   Output 300 ml  Net 676.85 ml   Filed Weights   04/07/20 0244  Weight: 60.4 kg    Examination:  General exam: Appears calm and comfortable  Respiratory system: coarse breath sounds, increased resp effort Cardiovascular system: S1 & S2 heard, Regular Gastrointestinal system: Abdomen is nondistended, soft and nontender. No organomegaly or masses felt. Normal bowel sounds heard. Central nervous system: lethargic. No focal neurological deficits. Extremities: Symmetric 5 x 5 power. Skin: No rashes, lesions Psychiatry: Unable to assess given current mentation.   Data Reviewed: I have personally reviewed following labs and imaging studies  CBC: Recent Labs  Lab 04/07/20 0306 04/08/20 0529  WBC 13.1* 10.7*  HGB 10.3* 9.4*  HCT 32.1* 27.7*  MCV 99.1 93.9  PLT 188 329*   Basic Metabolic Panel: Recent Labs  Lab 04/07/20 0306 04/07/20 0527 04/07/20 1047 04/07/20 1334 04/08/20 0529  NA 119* 119* 120* 119* 120*  K 2.7* 3.0* 3.5 3.8 3.9  CL 82* 83* 90* 89* 86*  CO2 17* 22 23 24 23   GLUCOSE 177* 116* 97 103* 118*  BUN 6* <5* <5* <5* <5*  CREATININE 0.66 0.65 0.52 0.60 0.72  CALCIUM 8.0* 7.7* 6.9* 7.5* 8.1*  MG  --  1.3*  --  2.0  --    GFR: Estimated Creatinine Clearance: 49 mL/min (by C-G formula based on SCr of 0.72 mg/dL). Liver Function Tests: Recent Labs  Lab 04/07/20 0306 04/07/20 0527  AST 103* 98*  ALT 39 34  ALKPHOS 163* 138*  BILITOT 1.1 1.0  PROT 6.5 5.8*  ALBUMIN 3.0* 2.5*   Recent Labs  Lab 04/07/20 0527  LIPASE 23   No results for input(s): AMMONIA in the last 168 hours. Coagulation Profile: Recent Labs  Lab 04/07/20 0527  INR 1.2   Cardiac Enzymes: No results for input(s): CKTOTAL, CKMB, CKMBINDEX, TROPONINI in the last 168 hours. BNP (last 3 results) No results for input(s): PROBNP in the last 8760 hours. HbA1C: No results for input(s): HGBA1C in the last 72 hours. CBG: Recent Labs  Lab 04/07/20 0308  GLUCAP 165*   Lipid  Profile: No results for input(s): CHOL, HDL, LDLCALC, TRIG, CHOLHDL, LDLDIRECT in the last 72 hours. Thyroid Function Tests: Recent Labs    04/07/20 0657  TSH 10.577*   Anemia Panel: No results for input(s): VITAMINB12, FOLATE, FERRITIN, TIBC, IRON, RETICCTPCT in the last 72 hours. Sepsis Labs: Recent Labs  Lab 04/07/20 0657 04/07/20 1047  LATICACIDVEN 3.6* 3.0*    Recent Results (from the past 240 hour(s))  Respiratory Panel by RT PCR (Flu A&B, Covid) - Nasopharyngeal Swab     Status: None   Collection Time: 04/07/20  4:30 AM   Specimen: Nasopharyngeal Swab  Result Value Ref Range Status   SARS Coronavirus 2 by RT PCR NEGATIVE NEGATIVE Final    Comment: (NOTE) SARS-CoV-2 target nucleic acids are NOT DETECTED.  The SARS-CoV-2 RNA is  generally detectable in upper respiratoy specimens during the acute phase of infection. The lowest concentration of SARS-CoV-2 viral copies this assay can detect is 131 copies/mL. A negative result does not preclude SARS-Cov-2 infection and should not be used as the sole basis for treatment or other patient management decisions. A negative result may occur with  improper specimen collection/handling, submission of specimen other than nasopharyngeal swab, presence of viral mutation(s) within the areas targeted by this assay, and inadequate number of viral copies (<131 copies/mL). A negative result must be combined with clinical observations, patient history, and epidemiological information. The expected result is Negative.  Fact Sheet for Patients:  PinkCheek.be  Fact Sheet for Healthcare Providers:  GravelBags.it  This test is no t yet approved or cleared by the [place] FDA and  has been authorized for detection and/or diagnosis of SARS-CoV-2 by FDA under an Emergency Use Authorization (EUA). This EUA will remain  in effect (meaning this test can be used) for the duration of  the COVID-19 declaration under Section 564(b)(1) of the Act, 21 U.S.C. section 360bbb-3(b)(1), unless the authorization is terminated or revoked sooner.     Influenza A by PCR NEGATIVE NEGATIVE Final   Influenza B by PCR NEGATIVE NEGATIVE Final    Comment: (NOTE) The Xpert Xpress SARS-CoV-2/FLU/RSV assay is intended as an aid in  the diagnosis of influenza from Nasopharyngeal swab specimens and  should not be used as a sole basis for treatment. Nasal washings and  aspirates are unacceptable for Xpert Xpress SARS-CoV-2/FLU/RSV  testing.  Fact Sheet for Patients: PinkCheek.be  Fact Sheet for Healthcare Providers: GravelBags.it  This test is not yet approved or cleared by the [place] FDA and  has been authorized for detection and/or diagnosis of SARS-CoV-2 by  FDA under an Emergency Use Authorization (EUA). This EUA will remain  in effect (meaning this test can be used) for the duration of the  Covid-19 declaration under Section 564(b)(1) of the Act, 21  U.S.C. section 360bbb-3(b)(1), unless the authorization is  terminated or revoked. Performed at [facility], [place] [address], [place], [place] [address]   MRSA PCR Screening     Status: None   Collection Time: 04/07/20  5:29 PM   Specimen: Nasal Mucosa; Nasopharyngeal  Result Value Ref Range Status   MRSA by PCR NEGATIVE NEGATIVE Final    Comment:        The GeneXpert MRSA Assay (FDA approved for NASAL specimens only), is one component of a comprehensive MRSA colonization surveillance program. It is not intended to diagnose MRSA infection nor to guide or monitor treatment for MRSA infections. Performed at [facility], [place] [address], [place], [place] [address]      Radiology Studies: EEG  Result Date: 04/07/2020 Lora Havens, MD     04/07/2020  5:21 PM Patient Name: KEIOSHA CANCRO MRN: [id number] Epilepsy  Attending: Lora Havens Referring Physician/Provider: Dr Gean Birchwood Date: 04/07/2020 Duration: 24.05 mins Patient history: 86yo F with seizures in setting of hyponatremia. EEG to evaluate for seizure Level of alertness: Awake AEDs during EEG study: None Technical aspects: This EEG study was done with scalp electrodes positioned according to the 10-20 International system of electrode placement. Electrical activity was acquired at a sampling rate of 500Hz  and reviewed with a high frequency filter of 70Hz  and a low frequency filter of 1Hz . EEG data were recorded continuously and digitally stored. Description: The posterior dominant rhythm consists of 9-10 Hz activity of moderate  voltage (25-35 uV) seen predominantly in posterior head regions, symmetric and reactive to eye opening and eye closing. There is also an excessive amount of 15 to 18 Hz beta activity distributed symmetrically and diffusely.  Hyperventilation and photic stimulation were not performed.   ABNORMALITY -Excessive beta, generalized IMPRESSION: This study is within normal limits. No seizures or epileptiform discharges were seen throughout the recording. The excessive beta activity seen in the background is most likely due to the effect of benzodiazepine and is a benign EEG pattern. Lora Havens   DG Chest 2 View  Result Date: 04/07/2020 CLINICAL DATA:  86 year old female with shortness of breath.  COPD. EXAM: CHEST - 2 VIEW COMPARISON:  03/12/2020 chest radiographs and earlier. FINDINGS: Upright AP and lateral views. 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 Right lung base calcified pleural plaques demonstrated by CT in 2018. Mediastinal contours are stable. No pneumothorax. But there is increased reticulonodular opacity in the right lower lung compared to March of this year. Left lung appears stable. Calcified aortic atherosclerosis. Osteopenia. No acute osseous abnormality  identified. Paucity of bowel gas in the upper abdomen. IMPRESSION: 1. Chronic lung disease with increased reticulonodular opacity in the right lower lung compatible with acute infectious exacerbation. 2. Aortic Atherosclerosis (ICD10-I70.0) and Emphysema (ICD10-J43.9). Electronically Signed   By: Genevie Ann M.D.   On: 04/07/2020 03:05   CT Head Wo Contrast  Result Date: 04/07/2020 CLINICAL DATA:  Nontraumatic seizure EXAM: CT HEAD WITHOUT CONTRAST TECHNIQUE: Contiguous axial images were obtained from the base of the skull through the vertex without intravenous contrast. COMPARISON:  01/08/2020 FINDINGS: Brain: No evidence of acute infarction, hemorrhage, hydrocephalus, extra-axial collection or mass lesion/mass effect. Generalized atrophy with chronic small vessel ischemic low-density in the deep white matter. Vascular: No hyperdense vessel or unexpected calcification. Skull: Normal. Negative for fracture or focal lesion. Sinuses/Orbits: No acute finding.  Bilateral cataract resection. Other: Motion degraded. IMPRESSION: 1. Aging brain without acute superimposed finding. 2. Intermittent motion artifact. Electronically Signed   By: Monte Fantasia M.D.   On: 04/07/2020 04:23   CT Angio Chest PE W and/or Wo Contrast  Result Date: 04/07/2020 CLINICAL DATA:  Positive D-dimer.  Can not catch breath EXAM: CT ANGIOGRAPHY CHEST WITH CONTRAST TECHNIQUE: Multidetector CT imaging of the chest was performed using the standard protocol during bolus administration of intravenous contrast. Multiplanar CT image reconstructions and MIPs were obtained to evaluate the vascular anatomy. CONTRAST:  121mL OMNIPAQUE IOHEXOL 350 MG/ML SOLN COMPARISON:  02/28/2017 FINDINGS: Cardiovascular: Normal heart size. No pericardial effusion. Advanced atheromatous calcification of the aorta and coronaries. The apical chest was excluded from the field of view on the CTA phase but was covered on a subsequent phase. Bulky calcification causes  high-grade narrowing at the left subclavian artery. Mediastinum/Nodes: Negative for adenopathy or mass. Lungs/Pleura: Emphysema and airway thickening. Chronic bronchial at the right lower lobe after prior surgery. There is pleural based calcification and volume loss at the right lower lobe. Subpleural opacity in the right lower lobe not seen in 2018. Bilateral interlobular septal thickening. Upper Abdomen: Reported separately Musculoskeletal: No acute finding. Review of the MIP images confirms the above findings. IMPRESSION: 1. No evidence of pulmonary embolism. 2. Right lower lobe pneumonia or atelectasis. 3. Borderline interstitial edema 4. Advanced COPD with asymmetric right-sided scarring where there has been prior lung cancer resection. 5. Advanced atherosclerosis. Aortic Atherosclerosis (ICD10-I70.0) and Emphysema (ICD10-J43.9). Electronically Signed   By: Neva Seat.D.  On: 04/07/2020 07:08   CT ABDOMEN PELVIS W CONTRAST  Result Date: 04/07/2020 CLINICAL DATA:  Epigastric pain EXAM: CT ABDOMEN AND PELVIS WITH CONTRAST TECHNIQUE: Multidetector CT imaging of the abdomen and pelvis was performed using the standard protocol following bolus administration of intravenous contrast. CONTRAST:  140mL OMNIPAQUE IOHEXOL 350 MG/ML SOLN COMPARISON:  01/01/2020 FINDINGS: Lower chest:  Reported on dedicated chest CT. Hepatobiliary: Possible steatosis of the liver.At least 1 calcified gallstone. No findings of cholecystitis. No bile duct dilatation. Pancreas: Unremarkable. Spleen: Unremarkable. Adrenals/Urinary Tract: Negative adrenals. No hydronephrosis or stone. Unremarkable bladder. Stomach/Bowel: Rectocele appearance. No bowel obstruction or visible inflammation. Numerous left colonic diverticula. Negative appendix. Vascular/Lymphatic: Diffuse atheromatous calcification with multiple narrowed visceral arteries, not well assessed on this study. No mass or adenopathy. Reproductive:Hysterectomy.  Pelvic floor  laxity Other: Loculated fluid in the low left pelvis, likely peritoneal inclusion cyst given stability from prior. Musculoskeletal: Remarkably severe right hip osteoarthritis with femoral head collapse and fragmentation. Generalized osteopenia and advanced spinal degeneration. IMPRESSION: 1. Motion degraded CT without acute finding. 2. Numerous chronic findings are stable from July 2021 and described above. Electronically Signed   By: Monte Fantasia M.D.   On: 04/07/2020 07:20   ECHOCARDIOGRAM COMPLETE  Result Date: 04/07/2020    ECHOCARDIOGRAM REPORT   Patient Name:   SAKOYA WIN Date of Exam: 04/07/2020 Medical Rec #:  [id number]         Height:       66.0 in Accession #:    [id number]        Weight:       133.2 lb Date of Birth:  [birth year]        BSA:          1.682 m Patient Age:    62 years          BP:           109/70 mmHg Patient Gender: F                 HR:           97 bpm. Exam Location:  Inpatient Procedure: 2D Echo, Cardiac Doppler, Color Doppler and Intracardiac            Opacification Agent Indications:    R07.89 Other chest pain. Elevated troponin.  History:        Patient has prior history of Echocardiogram examinations, most                 recent 11/28/2017. COPD, Signs/Symptoms:Chest Pain, Shortness of                 Breath and Dyspnea; Risk Factors:Former Smoker and Dyslipidemia.                 Elevated troponin. Hypoxia. Lung cancer. ETOH.  Sonographer:    Roseanna Rainbow RDCS Referring Phys: Rosa Sanchez  Sonographer Comments: Technically challenging study due to limited acoustic windows, Technically difficult study due to poor echo windows, suboptimal parasternal window, suboptimal apical window and suboptimal subcostal window. Image acquisition challenging due to COPD and Image acquisition challenging due to respiratory motion. Patient having trouble breathing throughout exam. Extremely difficult and limited study. IMPRESSIONS  1. Left ventricular ejection fraction, by  estimation, is 45 to 50%. The left ventricle has mildly decreased function. The left ventricle demonstrates global hypokinesis. Left ventricular diastolic parameters are consistent with Grade I diastolic dysfunction (impaired relaxation).  2. Right ventricular systolic function was not  well visualized. The right ventricular size is not well visualized.  3. The mitral valve was not well visualized. No evidence of mitral valve regurgitation.  4. The aortic valve was not well visualized. Aortic valve regurgitation is not visualized.  5. The inferior vena cava is dilated in size with <50% respiratory variability, suggesting right atrial pressure of 15 mmHg. Comparison(s): No prior Echocardiogram. Conclusion(s)/Recommendation(s): Difficult study even with echo constrast. FINDINGS  Left Ventricle: Left ventricular ejection fraction, by estimation, is 45 to 50%. The left ventricle has mildly decreased function. The left ventricle demonstrates global hypokinesis. Definity contrast agent was given IV to delineate the left ventricular  endocardial borders. The left ventricular internal cavity size was normal in size. There is no left ventricular hypertrophy. Left ventricular diastolic parameters are consistent with Grade I diastolic dysfunction (impaired relaxation). Right Ventricle: The right ventricular size is not well visualized. Right vetricular wall thickness was not well visualized. Right ventricular systolic function was not well visualized. Left Atrium: Left atrial size was normal in size. Right Atrium: Right atrial size was not well visualized. Pericardium: There is no evidence of pericardial effusion. Mitral Valve: The mitral valve was not well visualized. No evidence of mitral valve regurgitation. Tricuspid Valve: The tricuspid valve is not well visualized. Tricuspid valve regurgitation is not demonstrated. Aortic Valve: The aortic valve was not well visualized. Aortic valve regurgitation is not visualized.  Pulmonic Valve: The pulmonic valve was not well visualized. Pulmonic valve regurgitation is not visualized. Aorta: The aortic root is normal in size and structure and the ascending aorta was not well visualized. Venous: The pulmonary veins were not well visualized. The inferior vena cava is dilated in size with less than 50% respiratory variability, suggesting right atrial pressure of 15 mmHg. IAS/Shunts: The interatrial septum was not well visualized.  LEFT VENTRICLE PLAX 2D LVIDd:         4.40 cm     Diastology LVIDs:         3.50 cm     LV e' medial:    5.55 cm/s LV PW:         0.90 cm     LV E/e' medial:  13.1 LV IVS:        1.10 cm     LV e' lateral:   9.57 cm/s LVOT diam:     1.70 cm     LV E/e' lateral: 7.6 LV SV:         23 LV SV Index:   14 LVOT Area:     2.27 cm  LV Volumes (MOD) LV vol d, MOD A2C: 63.9 ml LV vol d, MOD A4C: 91.8 ml LV vol s, MOD A2C: 30.3 ml LV vol s, MOD A4C: 49.8 ml LV SV MOD A2C:     33.7 ml LV SV MOD A4C:     91.8 ml LV SV MOD BP:      38.6 ml IVC IVC diam: 2.40 cm LEFT ATRIUM           Index LA diam:      3.10 cm 1.84 cm/m LA Vol (A4C): 33.6 ml 19.97 ml/m  AORTIC VALVE LVOT Vmax:   56.40 cm/s LVOT Vmean:  35.800 cm/s LVOT VTI:    0.103 m  AORTA Ao Root diam: 3.20 cm MITRAL VALVE MV Area (PHT): 4.06 cm    SHUNTS MV Decel Time: 187 msec    Systemic VTI:  0.10 m MV E velocity: 72.80 cm/s  Systemic Diam: 1.70 cm MV  A velocity: 73.70 cm/s MV E/A ratio:  0.99 Rudean Haskell MD Electronically signed by Rudean Haskell MD Signature Date/Time: 04/07/2020/2:46:36 PM    Final     Scheduled Meds: . aspirin  81 mg Oral Daily  . Chlorhexidine Gluconate Cloth  6 each Topical Daily  . fluticasone  2 puff Inhalation BID  . folic acid  1 mg Oral Daily  . levalbuterol  1.25 mg Nebulization TID  . levothyroxine  100 mcg Oral Q0600  . LORazepam  0-4 mg Intravenous Q6H   Followed by  . [START ON 04/09/2020] LORazepam  0-4 mg Intravenous Q12H  . mouth rinse  15 mL Mouth Rinse  BID  . multivitamin with minerals  1 tablet Oral Daily  . rosuvastatin  20 mg Oral Daily  . thiamine  100 mg Oral Daily   Or  . thiamine  100 mg Intravenous Daily   Continuous Infusions: . sodium chloride Stopped (04/08/20 0539)  . cefTRIAXone (ROCEPHIN)  IV Stopped (04/08/20 0610)  . heparin 700 Units/hr (04/08/20 0600)     LOS: 1 day   Marylu Lund, MD [facility] Pager On Amion  If 7PM-7AM, please contact night-coverage 04/08/2020, 1:32 PM

## 2020-04-09 ENCOUNTER — Telehealth: Payer: Self-pay | Admitting: Internal Medicine

## 2020-04-09 DIAGNOSIS — R748 Abnormal levels of other serum enzymes: Secondary | ICD-10-CM | POA: Diagnosis not present

## 2020-04-09 DIAGNOSIS — J449 Chronic obstructive pulmonary disease, unspecified: Secondary | ICD-10-CM | POA: Diagnosis not present

## 2020-04-09 DIAGNOSIS — I214 Non-ST elevation (NSTEMI) myocardial infarction: Secondary | ICD-10-CM | POA: Diagnosis not present

## 2020-04-09 LAB — COMPREHENSIVE METABOLIC PANEL
ALT: 37 U/L (ref 0–44)
AST: 98 U/L — ABNORMAL HIGH (ref 15–41)
Albumin: 2.7 g/dL — ABNORMAL LOW (ref 3.5–5.0)
Alkaline Phosphatase: 126 U/L (ref 38–126)
Anion gap: 11 (ref 5–15)
BUN: 6 mg/dL — ABNORMAL LOW (ref 8–23)
CO2: 23 mmol/L (ref 22–32)
Calcium: 8 mg/dL — ABNORMAL LOW (ref 8.9–10.3)
Chloride: 87 mmol/L — ABNORMAL LOW (ref 98–111)
Creatinine, Ser: 0.79 mg/dL (ref 0.44–1.00)
GFR, Estimated: >60 mL/min (ref >60)
Glucose, Bld: 128 mg/dL — ABNORMAL HIGH (ref 70–99)
Potassium: 3.9 mmol/L (ref 3.5–5.1)
Sodium: 121 mmol/L — ABNORMAL LOW (ref 135–145)
Total Bilirubin: 0.9 mg/dL (ref 0.3–1.2)
Total Protein: 6.2 g/dL — ABNORMAL LOW (ref 6.5–8.1)

## 2020-04-09 LAB — CBC
HCT: 26.7 % — ABNORMAL LOW (ref 36.0–46.0)
Hemoglobin: 9 g/dL — ABNORMAL LOW (ref 12.0–15.0)
MCH: 32 pg (ref 26.0–34.0)
MCHC: 33.7 g/dL (ref 30.0–36.0)
MCV: 95 fL (ref 80.0–100.0)
Platelets: 136 10*3/uL — ABNORMAL LOW (ref 150–400)
RBC: 2.81 MIL/uL — ABNORMAL LOW (ref 3.87–5.11)
RDW: 12.8 % (ref 11.5–15.5)
WBC: 9.2 10*3/uL (ref 4.0–10.5)
nRBC: 0 % (ref 0.0–0.2)

## 2020-04-09 LAB — MAGNESIUM: Magnesium: 1.9 mg/dL (ref 1.7–2.4)

## 2020-04-09 LAB — HEPARIN LEVEL (UNFRACTIONATED): Heparin Unfractionated: 0.6 IU/mL (ref 0.30–0.70)

## 2020-04-09 LAB — LIPASE, BLOOD: Lipase: 25 U/L (ref 11–51)

## 2020-04-09 MED ORDER — GUAIFENESIN ER 600 MG PO TB12
600.0000 mg | ORAL_TABLET | Freq: Two times a day (BID) | ORAL | Status: DC | PRN
  Administered 2020-04-09 – 2020-04-13 (×4): 600 mg via ORAL
  Filled 2020-04-09 (×4): qty 1

## 2020-04-09 MED ORDER — ENOXAPARIN SODIUM 40 MG/0.4ML ~~LOC~~ SOLN
40.0000 mg | SUBCUTANEOUS | Status: DC
  Administered 2020-04-09 – 2020-04-16 (×8): 40 mg via SUBCUTANEOUS
  Filled 2020-04-09 (×7): qty 0.4

## 2020-04-09 NOTE — Evaluation (Addendum)
Clinical/Bedside Swallow Evaluation Patient Details  Name: [name] MRN: [id number] Date of Birth: [birth year]  Today's Date: 04/09/2020 Time: SLP Start Time (ACUTE ONLY): 0947 SLP Stop Time (ACUTE ONLY): 1035 SLP Time Calculation (min) (ACUTE ONLY): 48 min  Past Medical History:  Past Medical History:  Diagnosis Date  . Allergic rhinitis   . Anxiety   . Arthritis    "back, hands; hips" (12/22/2014)  . Asthma   . Bladder atony   . Chronic bronchitis ([place])   . COPD (chronic obstructive pulmonary disease) ([place])   . Diarrhea   . Emphysema of lung ([place])   . GERD (gastroesophageal reflux disease)    "w/spicey foods" (12/22/2014)  . Hyperlipidemia   . Non-small cell carcinoma of lung, stage 1 ([place]) 2005   1.4 cm Poorly differentiated Squamous cell RUL  T1N0 resected 09/22/2003  . On home oxygen therapy    "3L q night and prn during the day" (12/22/2014)  . Pelvic cyst   . Pneumonia    "this is the 3rd time that I can remember" (12/22/2014)  . Unspecified hypothyroidism    Past Surgical History:  Past Surgical History:  Procedure Laterality Date  . ABDOMINAL HYSTERECTOMY  ~ 1976  . BACK SURGERY    . CATARACT EXTRACTION W/ INTRAOCULAR LENS  IMPLANT, BILATERAL Bilateral ~ 2012  . DILATION AND CURETTAGE OF UTERUS    . LUMBAR DISC SURGERY  1970's  . LYMPH NODE DISSECTION  2005   "all my lymph nodes under breasts, went thru my back; Dr. Arlyce Dice did it"  . Needle aspiration Pelvic cyst  2011   Dr Diona Fanti  . VIDEO ASSISTED THORACOSCOPY (VATS)/THOROCOTOMY Right 09/2003   thoracotomy, right upper lobectomy with node dissection; Dr Demetrios Loll 11/02/2010  . VIDEO BRONCHOSCOPY  03/2004   Archie Endo 11/02/2010   HPI:  86 yo female adm to [facility] with AMS, COPD exacerbation and n/v, hospital coarse complicated by seizure.  Pt CT abdomen showed right LL pna.  CT head s howed againg brain without acute changes.  Pt is on 3 liters of oxygen at home at baseline.   Pt with H/O ETOH use, lung cancer  s/p partial lobectomy, COPD.   She is currently on heparain for NSTEMI.  Pt admits to h/o reflux and frequent belching for which she took a PPI for a period of time. Pt is essentially chair bound at home within the last few months due to frequent falls in the last few months.   Assessment / Plan / Recommendation Clinical Impression  Pt with negative CN exam, mildly hoarse, congested cough and becomes dyspneic upon exertion.  She was observed consuming ice chips, water, jello and nectar thick cranberry juice.  No overt clinical indication of aspiration observed with intake c/b cough immediately post-swallow.  She does have occasional productive cough (to clear secretions) during session that did not appear directly coorelated to po intake. Although increased RR was not evident during minimal intake, pt with increased accessory muscle use.  Eructation noted during session - but pt denies reflux occuring.  Recommend consider clear or full liquid diet to initiate po intake.   Pt admits to issues with belching, reflux and sister states pt regurgitates prior to admission.  She is not currently on a reflux medication and did take one for a period of time before stopping it approx 1 1/2 months ago.    Using teach back, informed pt of importance to "hock" and expectorate after coughing to clear secretions and taking  rest breaks if short of breath.  Will follow up next date.  Thanks for this order.  SLP Visit Diagnosis: Dysphagia, unspecified (R13.10)    Aspiration Risk  Moderate aspiration risk    Diet Recommendation Thin liquid (fulls vs clears)   Liquid Administration via: Cup;Straw Medication Administration: Whole meds with puree Supervision: Patient able to self feed Compensations: Small sips/bites;Slow rate Postural Changes: Seated upright at 90 degrees;Remain upright for at least 30 minutes after po intake    Other  Recommendations Oral Care Recommendations: Oral care BID   Follow up  Recommendations        Frequency and Duration min 1 x/week  1 week       Prognosis Prognosis for Safe Diet Advancement: Fair Barriers to Reach Goals: Other (Comment) (poor intake prior to admit)      Swallow Study   General Date of Onset: 04/09/20 HPI: 86 yo female adm to [facility] with AMS, COPD exacerbation and n/v, hospital coarse complicated by seizure.  Pt CT abdomen showed right LL pna.  CT head s howed againg brain without acute changes.  Pt is on 3 liters of oxygen at home at baseline.   Pt with H/O ETOH use, lung cancer s/p partial lobectomy, COPD.   She is currently on heparain for NSTEMI.  Pt admits to h/o reflux and frequent belching for which she took a PPI for a period of time. Pt is essentially chair bound at home within the last few months due to frequent falls in the last few months. Type of Study: Bedside Swallow Evaluation Diet Prior to this Study: NPO Temperature Spikes Noted: No Respiratory Status: Nasal cannula History of Recent Intubation: No Behavior/Cognition: Alert;Cooperative;Pleasant mood Oral Cavity Assessment: Within Functional Limits (? some bruising on soft palate - pt is on Heparin) Oral Care Completed by SLP: No Oral Cavity - Dentition: Adequate natural dentition Vision: Functional for self-feeding Self-Feeding Abilities: Able to feed self Patient Positioning: Upright in bed Baseline Vocal Quality: Low vocal intensity Volitional Cough: Strong;Congested Volitional Swallow: Unable to elicit    Oral/Motor/Sensory Function Overall Oral Motor/Sensory Function: Within functional limits   Ice Chips Ice chips: Within functional limits Presentation: Spoon;Self Fed   Thin Liquid Thin Liquid: Within functional limits Presentation: Cup;Self Fed;Spoon    Nectar Thick Nectar Thick Liquid: Within functional limits Presentation: Cup;Self Fed;Spoon   Honey Thick Honey Thick Liquid: Not tested   Puree Puree: Within functional limits (jello) Presentation: Self Fed    Solid     Solid: Not tested Other Comments: due to pt's increased WOB with any exertion concerning for particulate aspiration      [name] 04/09/2020,11:02 AM   Kathleen Lime, MS [facility] SLP Acute Rehab Services Office [phone] Pager [phone]

## 2020-04-09 NOTE — Telephone Encounter (Signed)
[name] calling from [place], please fax over two orders that they don't have:  Order # [id number], [id number]  Re-faxed orders [id number] and [id number] today  Fax# [phone]

## 2020-04-09 NOTE — Telephone Encounter (Signed)
Forms have already been faxed and sent to [facility]. If she calls back and forms not received she will need to refax forms.

## 2020-04-09 NOTE — Progress Notes (Addendum)
Progress Note  Patient Name: [name] Date of Encounter: 04/09/2020  [facility] Cardiologist: No primary care provider on file.   Subjective  More awake and alert this morning. Sitting up in bed. Answering questions appropriately. Still with audible wheezing.  Na 121. Albumin 2.7. Inpatient Medications    Scheduled Meds:  aspirin  81 mg Oral Daily   Chlorhexidine Gluconate Cloth  6 each Topical Daily   fluticasone  2 puff Inhalation BID   folic acid  1 mg Oral Daily   levalbuterol  1.25 mg Nebulization TID   levothyroxine  100 mcg Oral Q0600   LORazepam  0-4 mg Intravenous Q12H   mouth rinse  15 mL Mouth Rinse BID   methylPREDNISolone (SOLU-MEDROL) injection  40 mg Intravenous Q12H   multivitamin with minerals  1 tablet Oral Daily   rosuvastatin  20 mg Oral Daily   thiamine  100 mg Oral Daily   Or   thiamine  100 mg Intravenous Daily   Continuous Infusions:  sodium chloride 100 mL/hr at 04/09/20 0500   azithromycin Stopped (04/08/20 1651)   cefTRIAXone (ROCEPHIN)  IV Stopped (04/09/20 0735)   heparin 700 Units/hr (04/09/20 0500)   PRN Meds: guaiFENesin, levalbuterol, LORazepam **OR** LORazepam, morphine injection, ondansetron (ZOFRAN) IV   Vital Signs    Vitals:   04/09/20 0430 04/09/20 0500 04/09/20 0600 04/09/20 0800  BP:   133/78 136/64  Pulse:  (!) 104 99 98  Resp:  18 18 19   Temp: 98.3 F (36.8 C)   98.6 F (37 C)  TempSrc: Oral   Oral  SpO2:  100% 92% (!) 86%  Weight:   63.7 kg   Height:        Intake/Output Summary (Last 24 hours) at 04/09/2020 1136 Last data filed at 04/09/2020 0500 Gross per 24 hour  Intake 2049.25 ml  Output 550 ml  Net 1499.25 ml   Last 3 Weights 04/09/2020 04/07/2020 03/12/2020  Weight (lbs) 140 lb 6.9 oz 133 lb 2.5 oz 133 lb 3.2 oz  Weight (kg) 63.7 kg 60.4 kg 60.419 kg      Telemetry    Sinus tachycardia; frequent PACs - Personally Reviewed  ECG    No new ECGs this AM - Personally  Reviewed  Physical Exam   GEN: Sitting up in bed, more alert and interactive Neck: +JVD Cardiac: Tachycardic, regular, no murmur Respiratory: Tachpneic, diffuse, loud rhonchi and wheezing GI: Soft, nontender, non-distended  MS: Warm, no edema Neuro:  More alert and awake Psych: Normal affect   Labs    High Sensitivity Troponin:   Recent Labs  Lab 04/07/20 0319 04/07/20 0527 04/07/20 1047  TROPONINIHS 3,265* 3,454* 2,165*      Chemistry Recent Labs  Lab 04/07/20 0306 04/07/20 0306 04/07/20 0527 04/07/20 1047 04/07/20 1334 04/08/20 0529 04/09/20 0252  NA 119*   < > 119*   < > 119* 120* 121*  K 2.7*   < > 3.0*   < > 3.8 3.9 3.9  CL 82*   < > 83*   < > 89* 86* 87*  CO2 17*   < > 22   < > 24 23 23   GLUCOSE 177*   < > 116*   < > 103* 118* 128*  BUN 6*   < > <5*   < > <5* <5* 6*  CREATININE 0.66   < > 0.65   < > 0.60 0.72 0.79  CALCIUM 8.0*   < > 7.7*   < >  7.5* 8.1* 8.0*  PROT 6.5  --  5.8*  --   --   --  6.2*  ALBUMIN 3.0*  --  2.5*  --   --   --  2.7*  AST 103*  --  98*  --   --   --  98*  ALT 39  --  34  --   --   --  37  ALKPHOS 163*  --  138*  --   --   --  126  BILITOT 1.1  --  1.0  --   --   --  0.9  GFRNONAA >60   < > >60   < > >60 >60 >60  ANIONGAP 20*   < > 14   < > 6 11 11    < > = values in this interval not displayed.     Hematology Recent Labs  Lab 04/07/20 0306 04/08/20 0529 04/09/20 0252  WBC 13.1* 10.7* 9.2  RBC 3.24* 2.95* 2.81*  HGB 10.3* 9.4* 9.0*  HCT 32.1* 27.7* 26.7*  MCV 99.1 93.9 95.0  MCH 31.8 31.9 32.0  MCHC 32.1 33.9 33.7  RDW 12.5 12.6 12.8  PLT 188 146* 136*    BNP Recent Labs  Lab 04/07/20 0320  BNP 404.1*     DDimer  Recent Labs  Lab 04/07/20 0340  DDIMER 2.91*     Radiology    EEG  Result Date: 04/07/2020 Lora Havens, MD     04/07/2020  5:21 PM Patient Name: [name] MRN: [id number] Epilepsy Attending: Lora Havens Referring Physician/Provider: Dr Gean Birchwood Date: 04/07/2020  Duration: 24.05 mins Patient history: 86yo F with seizures in setting of hyponatremia. EEG to evaluate for seizure Level of alertness: Awake AEDs during EEG study: None Technical aspects: This EEG study was done with scalp electrodes positioned according to the 10-20 International system of electrode placement. Electrical activity was acquired at a sampling rate of 500Hz  and reviewed with a high frequency filter of 70Hz  and a low frequency filter of 1Hz . EEG data were recorded continuously and digitally stored. Description: The posterior dominant rhythm consists of 9-10 Hz activity of moderate voltage (25-35 uV) seen predominantly in posterior head regions, symmetric and reactive to eye opening and eye closing. There is also an excessive amount of 15 to 18 Hz beta activity distributed symmetrically and diffusely.  Hyperventilation and photic stimulation were not performed.   ABNORMALITY -Excessive beta, generalized IMPRESSION: This study is within normal limits. No seizures or epileptiform discharges were seen throughout the recording. The excessive beta activity seen in the background is most likely due to the effect of benzodiazepine and is a benign EEG pattern. Lora Havens   ECHOCARDIOGRAM COMPLETE  Result Date: 04/07/2020    ECHOCARDIOGRAM REPORT   Patient Name:   [name] Date of Exam: 04/07/2020 Medical Rec #:  [id number]         Height:       66.0 in Accession #:    [id number]        Weight:       133.2 lb Date of Birth:  06/08/1933        BSA:          1.682 m Patient Age:    40 years          BP:           109/70 mmHg Patient Gender: F  HR:           97 bpm. Exam Location:  Inpatient Procedure: 2D Echo, Cardiac Doppler, Color Doppler and Intracardiac            Opacification Agent Indications:    R07.89 Other chest pain. Elevated troponin.  History:        Patient has prior history of Echocardiogram examinations, most                 recent 11/28/2017. COPD,  Signs/Symptoms:Chest Pain, Shortness of                 Breath and Dyspnea; Risk Factors:Former Smoker and Dyslipidemia.                 Elevated troponin. Hypoxia. Lung cancer. ETOH.  Sonographer:    Roseanna Rainbow RDCS Referring Phys: [place]  Sonographer Comments: Technically challenging study due to limited acoustic windows, Technically difficult study due to poor echo windows, suboptimal parasternal window, suboptimal apical window and suboptimal subcostal window. Image acquisition challenging due to COPD and Image acquisition challenging due to respiratory motion. Patient having trouble breathing throughout exam. Extremely difficult and limited study. IMPRESSIONS  1. Left ventricular ejection fraction, by estimation, is 45 to 50%. The left ventricle has mildly decreased function. The left ventricle demonstrates global hypokinesis. Left ventricular diastolic parameters are consistent with Grade I diastolic dysfunction (impaired relaxation).  2. Right ventricular systolic function was not well visualized. The right ventricular size is not well visualized.  3. The mitral valve was not well visualized. No evidence of mitral valve regurgitation.  4. The aortic valve was not well visualized. Aortic valve regurgitation is not visualized.  5. The inferior vena cava is dilated in size with <50% respiratory variability, suggesting right atrial pressure of 15 mmHg. Comparison(s): No prior Echocardiogram. Conclusion(s)/Recommendation(s): Difficult study even with echo constrast. FINDINGS  Left Ventricle: Left ventricular ejection fraction, by estimation, is 45 to 50%. The left ventricle has mildly decreased function. The left ventricle demonstrates global hypokinesis. Definity contrast agent was given IV to delineate the left ventricular  endocardial borders. The left ventricular internal cavity size was normal in size. There is no left ventricular hypertrophy. Left ventricular diastolic parameters are  consistent with Grade I diastolic dysfunction (impaired relaxation). Right Ventricle: The right ventricular size is not well visualized. Right vetricular wall thickness was not well visualized. Right ventricular systolic function was not well visualized. Left Atrium: Left atrial size was normal in size. Right Atrium: Right atrial size was not well visualized. Pericardium: There is no evidence of pericardial effusion. Mitral Valve: The mitral valve was not well visualized. No evidence of mitral valve regurgitation. Tricuspid Valve: The tricuspid valve is not well visualized. Tricuspid valve regurgitation is not demonstrated. Aortic Valve: The aortic valve was not well visualized. Aortic valve regurgitation is not visualized. Pulmonic Valve: The pulmonic valve was not well visualized. Pulmonic valve regurgitation is not visualized. Aorta: The aortic root is normal in size and structure and the ascending aorta was not well visualized. Venous: The pulmonary veins were not well visualized. The inferior vena cava is dilated in size with less than 50% respiratory variability, suggesting right atrial pressure of 15 mmHg. IAS/Shunts: The interatrial septum was not well visualized.  LEFT VENTRICLE PLAX 2D LVIDd:         4.40 cm     Diastology LVIDs:         3.50 cm     LV e' medial:  5.55 cm/s LV PW:         0.90 cm     LV E/e' medial:  13.1 LV IVS:        1.10 cm     LV e' lateral:   9.57 cm/s LVOT diam:     1.70 cm     LV E/e' lateral: 7.6 LV SV:         23 LV SV Index:   14 LVOT Area:     2.27 cm  LV Volumes (MOD) LV vol d, MOD A2C: 63.9 ml LV vol d, MOD A4C: 91.8 ml LV vol s, MOD A2C: 30.3 ml LV vol s, MOD A4C: 49.8 ml LV SV MOD A2C:     33.7 ml LV SV MOD A4C:     91.8 ml LV SV MOD BP:      38.6 ml IVC IVC diam: 2.40 cm LEFT ATRIUM           Index LA diam:      3.10 cm 1.84 cm/m LA Vol (A4C): 33.6 ml 19.97 ml/m  AORTIC VALVE LVOT Vmax:   56.40 cm/s LVOT Vmean:  35.800 cm/s LVOT VTI:    0.103 m  AORTA Ao Root diam:  3.20 cm MITRAL VALVE MV Area (PHT): 4.06 cm    SHUNTS MV Decel Time: 187 msec    Systemic VTI:  0.10 m MV E velocity: 72.80 cm/s  Systemic Diam: 1.70 cm MV A velocity: 73.70 cm/s MV E/A ratio:  0.99 Rudean Haskell MD Electronically signed by Rudean Haskell MD Signature Date/Time: 04/07/2020/2:46:36 PM    Final     Cardiac Studies   TTE 04/07/20:  1. Left ventricular ejection fraction, by estimation, is 45 to 50%. The  left ventricle has mildly decreased function. The left ventricle  demonstrates global hypokinesis. Left ventricular diastolic parameters are  consistent with Grade I diastolic  dysfunction (impaired relaxation).   2. Right ventricular systolic function was not well visualized. The right  ventricular size is not well visualized.   3. The mitral valve was not well visualized. No evidence of mitral valve  regurgitation.   4. The aortic valve was not well visualized. Aortic valve regurgitation  is not visualized.   5. The inferior vena cava is dilated in size with <50% respiratory  variability, suggesting right atrial pressure of 15 mmHg.   Patient Profile     86 y.o. female with history of severe COPD/emphysema on 3L [place], small cell lung cancer s/p right upper lobectomy in 2012, alcohol abuse with history of withdrawals who presented with nausea, vomiting and abdominal pain found to have NSTEMI with trop 3265 as well as significant hyponatremia. Now on medical management for NSTEMI.  Assessment & Plan  #NSTEMI: #CAD Patient with elevated troponin to 3454 in the setting of nausea, vomiting and Na 119. No active chest pain on our evaluation and ECG without evidence of ischemia. TTE with LVEF 40-45% but global hypokinesis. CTA with evidence of significant coronary disease, however, given overall comoridities and lack of chest pain, decision was made in coordination with the patient and her family to proceed with medical management at this time. -Stop heparin gtt now as  has been on for 48 hours -Continue ASA 81mg  daily -Continue Crestor 20mg  daily -No beta blocker given severe COPD and active wheezing -Would benefit from ACE/ARB in the future -Pending clinical improvement, can proceed with stress testing in the future if within goals of care  #Acute on Chronic Systolic heart  failure with LVEF 40-45%: Global hypokinesis. Has significant calcification of coronary arteries on CTA chest suggestive of underlying multivessel CAD. Given comorbidities as above, currently treating medically. Appears mildly hypervolemic however no diuresis for now given Na 120. -Holding diuresis for now; will need in future once Na improved -No BB due to severe COPD -Would benefit from ACE/ARB in the future -Managing NSTEMI medically as above  #Hyponatremia: #Alcohol abuse #Poor PO intake Likely due to significant alcohol abuse as well as poor PO intake, nausea and vomiting. -Holding diuretics for now as do not want to worsen hyponatremia; will need diuresis eventually once more stable -Management per primary team  #COPD Exacerbation on 3L O2 at home #Pneumonia: -Continue supplemental O2 -Management per primary team  #HLD: -Continue crestor 20mg  daily   For questions or updates, please contact [facility] Please consult [email or web address] for contact info under        Signed, Freada Bergeron, MD  04/09/2020, 11:36 AM

## 2020-04-09 NOTE — Progress Notes (Signed)
PROGRESS NOTE    [name]  EXH:[id number] DOB: [birth year] DOA: 04/07/2020 PCP: Binnie Rail, MD    Brief Narrative:  86 year old female with history of COPD on 3 L of oxygen at home, alcohol abuse, hypothyroidism came to ED with nausea and vomiting.  Patient had generalized tonic-clonic seizure in the ED.  She was found to have significant lab abnormalities including hypomagnesemia, magnesium 1.3, hypokalemia potassium 2.7, hyponatremia sodium 119.  BNP was 404, troponin elevated to 3200.  Covid test was negative.  Chest x-ray showed possible infiltrate.   Assessment & Plan:   Principal Problem:   Non-ST elevation MI (NSTEMI) ([facility]) Active Problems:   Hypothyroidism   COPD mixed type (HCC)   Non-small cell carcinoma of lung, stage 1 (HCC)   Chronic respiratory failure with hypoxia (HCC)   Nausea & vomiting   Hyponatremia   Alcohol abuse  NSTEMI -Cardiology was consulted -Per Cardiology, continue ASA 81mg , Crestor 20mg . May benefit from ACE/ARB in the future -Heparin gtt stopped as pt has been on for 48hrs -Per Cardiology, pending clinical improvement, can proceed with stress testing in the future if within goals of care  Seizure -likely from alcohol withdrawal versus metabolic abnormalities.   -Presenting sodium is 119, up to 121 today -EEG reviewed, unremarkable -Dr. Darrick Meigs discussed with neurology who did not recommend seizure meds -Remains seizure free thus far  Alcohol withdrawal-patient drinks alcohol daily -Discussed with family in room. Last ETOH is believed to be on 10/17 -Continue with CIWA as tolerated -Suspect ETOH is related to below pna, suspect aspiration PNA -Most recent CIWA score noted to be 8 overnight  Pneumonia -Presenting CT chest obtained is negative for PE, however is pos for right lower lobe pneumonia, given presenting nausea/vomiting in the setting of ETOH abuse, consider aspiration PNA -Currently tolerating rocephin and  azithromycin -Have ordered flutter valve and mucinex  Hyponatremia -Presenting sodium is 119 -Sodium up to 121 today -Given EtOH hx, consider component of beer potomania -recheck bmet in AM  COPD exacerbation -continue Xopenex as needed -continues with wheezing on exam increased resp effort. Continue solumedrol  Hypothyroidism -Presenting TSH is elevated at 10.57, patient is on Synthroid 88 mcg at home.  -Synthroid was recently increased to 100 mcg daily.   -Will check free T4 -Possible element of sick euthyroid  Intractable nausea and vomiting --CT abdomen/pelvis is unremarkable.  Lipase 23.  Continue Zofran as needed for nausea vomiting. -suspect related to on-going ETOH abuse -Appreciate assistance by SLP. Now on clears   DVT prophylaxis: Heparin gtt Code Status: DNR Family Communication: Pt in room, pt's family at bedside  Status is: Inpatient  Remains inpatient appropriate because:Hemodynamically unstable, Altered mental status, Unsafe d/c plan, IV treatments appropriate due to intensity of illness or inability to take PO and Inpatient level of care appropriate due to severity of illness   Dispo: The patient is from: Home              Anticipated d/c is to: Unclear. Will need PT eval when pt is more stable              Anticipated d/c date is: > 3 days              Patient currently is not medically stable to d/c.  Consultants:   Cardiology  Procedures:     Antimicrobials: Anti-infectives (From admission, onward)   Start     Dose/Rate Route Frequency Ordered Stop   04/08/20 1500  azithromycin (ZITHROMAX)  250 mg in dextrose 5 % 125 mL IVPB  Status:  Discontinued        250 mg 125 mL/hr over 60 Minutes Intravenous Every 24 hours 04/08/20 1402 04/08/20 1406   04/08/20 1500  azithromycin (ZITHROMAX) 500 mg in sodium chloride 0.9 % 250 mL IVPB        500 mg 250 mL/hr over 60 Minutes Intravenous Every 24 hours 04/08/20 1407     04/07/20 0600  cefTRIAXone  (ROCEPHIN) 1 g in sodium chloride 0.9 % 100 mL IVPB        1 g 200 mL/hr over 30 Minutes Intravenous Every 24 hours 04/07/20 0553        Subjective: More alert today. Does report continued sob and coughing  Objective: Vitals:   04/09/20 0800 04/09/20 1215 04/09/20 1448 04/09/20 1600  BP: 136/64 131/73  111/76  Pulse: 98 (!) 107  93  Resp: 19 (!) 27  20  Temp: 98.6 F (37 C) 97.8 F (36.6 C)    TempSrc: Oral Oral    SpO2: (!) 86% 96% 94% 97%  Weight:      Height:        Intake/Output Summary (Last 24 hours) at 04/09/2020 1741 Last data filed at 04/09/2020 0500 Gross per 24 hour  Intake 1799.25 ml  Output 550 ml  Net 1249.25 ml   Filed Weights   04/07/20 0244 04/09/20 0600  Weight: 60.4 kg 63.7 kg    Examination: General exam: Awake, laying in bed, in nad Respiratory system: Increased resp effort, coarse breath sounds, end-expiratory wheezing throughout Cardiovascular system: regular rate, s1, s2 Gastrointestinal system: Soft, nondistended, positive BS Central nervous system: CN2-12 grossly intact, strength intact Extremities: Perfused, no clubbing Skin: Normal skin turgor, no notable skin lesions seen Psychiatry: Mood normal // no visual hallucinations   Data Reviewed: I have personally reviewed following labs and imaging studies  CBC: Recent Labs  Lab 04/07/20 0306 04/08/20 0529 04/09/20 0252  WBC 13.1* 10.7* 9.2  HGB 10.3* 9.4* 9.0*  HCT 32.1* 27.7* 26.7*  MCV 99.1 93.9 95.0  PLT 188 146* 662*   Basic Metabolic Panel: Recent Labs  Lab 04/07/20 0527 04/07/20 1047 04/07/20 1334 04/08/20 0529 04/09/20 0252  NA 119* 120* 119* 120* 121*  K 3.0* 3.5 3.8 3.9 3.9  CL 83* 90* 89* 86* 87*  CO2 22 23 24 23 23   GLUCOSE 116* 97 103* 118* 128*  BUN <5* <5* <5* <5* 6*  CREATININE 0.65 0.52 0.60 0.72 0.79  CALCIUM 7.7* 6.9* 7.5* 8.1* 8.0*  MG 1.3*  --  2.0  --  1.9   GFR: Estimated Creatinine Clearance: 49 mL/min (by C-G formula based on SCr of 0.79  mg/dL). Liver Function Tests: Recent Labs  Lab 04/07/20 0306 04/07/20 0527 04/09/20 0252  AST 103* 98* 98*  ALT 39 34 37  ALKPHOS 163* 138* 126  BILITOT 1.1 1.0 0.9  PROT 6.5 5.8* 6.2*  ALBUMIN 3.0* 2.5* 2.7*   Recent Labs  Lab 04/07/20 0527 04/09/20 0252  LIPASE 23 25   No results for input(s): AMMONIA in the last 168 hours. Coagulation Profile: Recent Labs  Lab 04/07/20 0527  INR 1.2   Cardiac Enzymes: No results for input(s): CKTOTAL, CKMB, CKMBINDEX, TROPONINI in the last 168 hours. BNP (last 3 results) No results for input(s): PROBNP in the last 8760 hours. HbA1C: No results for input(s): HGBA1C in the last 72 hours. CBG: Recent Labs  Lab 04/07/20 0308  GLUCAP 165*   Lipid  Profile: No results for input(s): CHOL, HDL, LDLCALC, TRIG, CHOLHDL, LDLDIRECT in the last 72 hours. Thyroid Function Tests: Recent Labs    04/07/20 0657  TSH 10.577*   Anemia Panel: No results for input(s): VITAMINB12, FOLATE, FERRITIN, TIBC, IRON, RETICCTPCT in the last 72 hours. Sepsis Labs: Recent Labs  Lab 04/07/20 0657 04/07/20 1047  LATICACIDVEN 3.6* 3.0*    Recent Results (from the past 240 hour(s))  Respiratory Panel by RT PCR (Flu A&B, Covid) - Nasopharyngeal Swab     Status: None   Collection Time: 04/07/20  4:30 AM   Specimen: Nasopharyngeal Swab  Result Value Ref Range Status   SARS Coronavirus 2 by RT PCR NEGATIVE NEGATIVE Final    Comment: (NOTE) SARS-CoV-2 target nucleic acids are NOT DETECTED.  The SARS-CoV-2 RNA is generally detectable in upper respiratoy specimens during the acute phase of infection. The lowest concentration of SARS-CoV-2 viral copies this assay can detect is 131 copies/mL. A negative result does not preclude SARS-Cov-2 infection and should not be used as the sole basis for treatment or other patient management decisions. A negative result may occur with  improper specimen collection/handling, submission of specimen other than  nasopharyngeal swab, presence of viral mutation(s) within the areas targeted by this assay, and inadequate number of viral copies (<131 copies/mL). A negative result must be combined with clinical observations, patient history, and epidemiological information. The expected result is Negative.  Fact Sheet for Patients:  PinkCheek.be  Fact Sheet for Healthcare Providers:  GravelBags.it  This test is no t yet approved or cleared by the [place] FDA and  has been authorized for detection and/or diagnosis of SARS-CoV-2 by FDA under an Emergency Use Authorization (EUA). This EUA will remain  in effect (meaning this test can be used) for the duration of the COVID-19 declaration under Section 564(b)(1) of the Act, 21 U.S.C. section 360bbb-3(b)(1), unless the authorization is terminated or revoked sooner.     Influenza A by PCR NEGATIVE NEGATIVE Final   Influenza B by PCR NEGATIVE NEGATIVE Final    Comment: (NOTE) The Xpert Xpress SARS-CoV-2/FLU/RSV assay is intended as an aid in  the diagnosis of influenza from Nasopharyngeal swab specimens and  should not be used as a sole basis for treatment. Nasal washings and  aspirates are unacceptable for Xpert Xpress SARS-CoV-2/FLU/RSV  testing.  Fact Sheet for Patients: PinkCheek.be  Fact Sheet for Healthcare Providers: GravelBags.it  This test is not yet approved or cleared by the [place] FDA and  has been authorized for detection and/or diagnosis of SARS-CoV-2 by  FDA under an Emergency Use Authorization (EUA). This EUA will remain  in effect (meaning this test can be used) for the duration of the  Covid-19 declaration under Section 564(b)(1) of the Act, 21  U.S.C. section 360bbb-3(b)(1), unless the authorization is  terminated or revoked. Performed at [facility], [place] [address], [place],  [address]   MRSA PCR Screening     Status: None   Collection Time: 04/07/20  5:29 PM   Specimen: Nasal Mucosa; Nasopharyngeal  Result Value Ref Range Status   MRSA by PCR NEGATIVE NEGATIVE Final    Comment:        The GeneXpert MRSA Assay (FDA approved for NASAL specimens only), is one component of a comprehensive MRSA colonization surveillance program. It is not intended to diagnose MRSA infection nor to guide or monitor treatment for MRSA infections. Performed at [facility], [address] [place], [place]  [place]      Radiology Studies: No results found.  Scheduled Meds: . aspirin  81 mg Oral Daily  . Chlorhexidine Gluconate Cloth  6 each Topical Daily  . enoxaparin (LOVENOX) injection  40 mg Subcutaneous Q24H  . fluticasone  2 puff Inhalation BID  . folic acid  1 mg Oral Daily  . levalbuterol  1.25 mg Nebulization TID  . levothyroxine  100 mcg Oral Q0600  . LORazepam  0-4 mg Intravenous Q12H  . mouth rinse  15 mL Mouth Rinse BID  . methylPREDNISolone (SOLU-MEDROL) injection  40 mg Intravenous Q12H  . multivitamin with minerals  1 tablet Oral Daily  . rosuvastatin  20 mg Oral Daily  . thiamine  100 mg Oral Daily   Or  . thiamine  100 mg Intravenous Daily   Continuous Infusions: . sodium chloride 100 mL/hr at 04/09/20 1344  . azithromycin 500 mg (04/09/20 1507)  . cefTRIAXone (ROCEPHIN)  IV Stopped (04/09/20 0735)     LOS: 2 days   Marylu Lund, MD [facility] Pager On Amion  If 7PM-7AM, please contact night-coverage 04/09/2020, 5:41 PM

## 2020-04-10 DIAGNOSIS — J449 Chronic obstructive pulmonary disease, unspecified: Secondary | ICD-10-CM | POA: Diagnosis not present

## 2020-04-10 DIAGNOSIS — R748 Abnormal levels of other serum enzymes: Secondary | ICD-10-CM | POA: Diagnosis not present

## 2020-04-10 DIAGNOSIS — I214 Non-ST elevation (NSTEMI) myocardial infarction: Secondary | ICD-10-CM | POA: Diagnosis not present

## 2020-04-10 LAB — COMPREHENSIVE METABOLIC PANEL
ALT: 34 U/L (ref 0–44)
AST: 76 U/L — ABNORMAL HIGH (ref 15–41)
Albumin: 2.4 g/dL — ABNORMAL LOW (ref 3.5–5.0)
Alkaline Phosphatase: 112 U/L (ref 38–126)
Anion gap: 5 (ref 5–15)
BUN: 7 mg/dL — ABNORMAL LOW (ref 8–23)
CO2: 24 mmol/L (ref 22–32)
Calcium: 8 mg/dL — ABNORMAL LOW (ref 8.9–10.3)
Chloride: 92 mmol/L — ABNORMAL LOW (ref 98–111)
Creatinine, Ser: 0.72 mg/dL (ref 0.44–1.00)
GFR, Estimated: >60 mL/min (ref >60)
Glucose, Bld: 121 mg/dL — ABNORMAL HIGH (ref 70–99)
Potassium: 4.3 mmol/L (ref 3.5–5.1)
Sodium: 121 mmol/L — ABNORMAL LOW (ref 135–145)
Total Bilirubin: 0.6 mg/dL (ref 0.3–1.2)
Total Protein: 5.6 g/dL — ABNORMAL LOW (ref 6.5–8.1)

## 2020-04-10 LAB — CBC
HCT: 26.6 % — ABNORMAL LOW (ref 36.0–46.0)
Hemoglobin: 8.6 g/dL — ABNORMAL LOW (ref 12.0–15.0)
MCH: 31.9 pg (ref 26.0–34.0)
MCHC: 32.3 g/dL (ref 30.0–36.0)
MCV: 98.5 fL (ref 80.0–100.0)
Platelets: 125 10*3/uL — ABNORMAL LOW (ref 150–400)
RBC: 2.7 MIL/uL — ABNORMAL LOW (ref 3.87–5.11)
RDW: 13.3 % (ref 11.5–15.5)
WBC: 8.6 10*3/uL (ref 4.0–10.5)
nRBC: 0 % (ref 0.0–0.2)

## 2020-04-10 LAB — T4, FREE: Free T4: 0.69 ng/dL (ref 0.61–1.12)

## 2020-04-10 MED ORDER — LOSARTAN POTASSIUM 25 MG PO TABS
25.0000 mg | ORAL_TABLET | Freq: Every day | ORAL | Status: DC
  Administered 2020-04-10 – 2020-04-17 (×8): 25 mg via ORAL
  Filled 2020-04-10 (×8): qty 1

## 2020-04-10 MED ORDER — AZITHROMYCIN 250 MG PO TABS
500.0000 mg | ORAL_TABLET | Freq: Every day | ORAL | Status: DC
  Administered 2020-04-12 – 2020-04-13 (×2): 500 mg via ORAL
  Filled 2020-04-10 (×3): qty 2

## 2020-04-10 MED ORDER — ALPRAZOLAM 1 MG PO TABS
1.0000 mg | ORAL_TABLET | Freq: Every day | ORAL | Status: DC
  Administered 2020-04-10 – 2020-04-16 (×7): 1 mg via ORAL
  Filled 2020-04-10 (×7): qty 1

## 2020-04-10 NOTE — Evaluation (Signed)
Physical Therapy Evaluation Patient Details Name: [name] MRN: [id number] DOB: [birth year] Today's Date: 04/10/2020   History of Present Illness  86 y.o. female with history of severe COPD/emphysema on 3L [place], small cell lung cancer s/p right upper lobectomy in 2012, alcohol abuse with history of withdrawals who presented with nausea, vomiting and abdominal pain found to have NSTEMI with trop 3265 as well as significant hyponatremia. Now on medical management for NSTEMI.  Clinical Impression  [name] is an 86 year old woman who presents with generalized weakness, decreased activity tolerance, poor balance and impaired cardiopulmonary status requiring 4 L [place] and use of NRB to recover sudden sat drop. Patient mod x 2 to for bed mobility and sit to stands. Evaluation limited to side of bed at RN's request. Patient's daughter assists with PLOF questions and reports her husband has been assisting her with ADLs and her mobility is limited to transfers and use of wheelchair. Patient reports her mother will not want to go to rehab at discharge and has been receiving [facility] therapy. Patient will benefit from skilled PT services while in hospital to improve deficits and decreased caregiver burden for future potential return home.  If pt and family refuse SNF level rehab, pt would benefit from follow up Tool.     SNF    Equipment Recommendations  None recommended by PT    Recommendations for Other Services       Precautions / Restrictions Precautions Precautions: Fall Precaution Comments: o2 sats can drop, NRB to recover Restrictions Weight Bearing Restrictions: No      Mobility  Bed Mobility Overal bed mobility: Needs Assistance Bed Mobility: Supine to Sit;Sit to Supine     Supine to sit: Mod assist;+2 for physical assistance;+2 for safety/equipment;HOB elevated Sit to supine: Mod assist;+2 for physical assistance;+2 for safety/equipment   General bed mobility comments:  Increased time with assist to manage LEs and trunk    Transfers Overall transfer level: Needs assistance Equipment used: 2 person hand held assist Transfers: Sit to/from Stand Sit to Stand: +2 physical assistance;Mod assist;From elevated surface         General transfer comment: Pt stood x 2 with cues for use of UEs to self assist and holding to back of recliner to stabilize in standing  Ambulation/Gait             General Gait Details: Pt stood twice with assist of 2 but unable to initiate step  Stairs            Wheelchair Mobility    Modified Rankin (Stroke Patients Only)       Balance Overall balance assessment: History of Falls;Needs assistance Sitting-balance support: No upper extremity supported;Feet supported Sitting balance-Leahy Scale: Fair     Standing balance support: Bilateral upper extremity supported;During functional activity Standing balance-Leahy Scale: Poor Standing balance comment: requires upper extremity support                             Pertinent Vitals/Pain Pain Assessment: No/denies pain    Home Living Family/patient expects to be discharged to:: Unsure Living Arrangements: Spouse/significant other Available Help at Discharge: Available 24 hours/day;Family Type of Home: House Home Access: Stairs to enter;Ramped entrance   Entrance Stairs-Number of Steps: 3 Home Layout: One level Home Equipment: Wheelchair - manual;Grab bars - tub/shower;Grab bars - toilet;Bedside commode;Shower seat Additional Comments: had a fall in june and predominantly just pivots for transfers since.  spouse pushes wc.    Prior Function Level of Independence: Needs assistance   Gait / Transfers Assistance Needed: just transfers  ADL's / Homemaking Assistance Needed: Husband assists with dressing, bathing, toileting, performs sponge bath  Comments: daughter checks in on parents daily     Hand Dominance   Dominant Hand: Right     Extremity/Trunk Assessment   Upper Extremity Assessment Upper Extremity Assessment: Generalized weakness    Lower Extremity Assessment Lower Extremity Assessment: Generalized weakness    Cervical / Trunk Assessment Cervical / Trunk Assessment: Kyphotic  Communication      Cognition Arousal/Alertness: Awake/alert Behavior During Therapy: WFL for tasks assessed/performed Overall Cognitive Status: Within Functional Limits for tasks assessed                                        General Comments      Exercises     Assessment/Plan    PT Assessment Patient needs continued PT services  PT Problem List Decreased strength;Decreased range of motion;Decreased activity tolerance;Decreased balance;Decreased mobility;Decreased knowledge of use of DME;Decreased safety awareness       PT Treatment Interventions DME instruction;Gait training;Functional mobility training;Therapeutic activities;Therapeutic exercise;Balance training;Patient/family education    PT Goals (Current goals can be found in the Care Plan section)  Acute Rehab PT Goals Patient Stated Goal: To get strength back PT Goal Formulation: With patient Time For Goal Achievement: 04/24/20 Potential to Achieve Goals: Fair    Frequency Min 3X/week   Barriers to discharge        Co-evaluation PT/OT/SLP Co-Evaluation/Treatment: Yes Reason for Co-Treatment: For patient/therapist safety PT goals addressed during session: Mobility/safety with mobility OT goals addressed during session: ADL's and self-care       AM-PAC PT "6 Clicks" Mobility  Outcome Measure Help needed turning from your back to your side while in a flat bed without using bedrails?: A Little Help needed moving from lying on your back to sitting on the side of a flat bed without using bedrails?: A Lot Help needed moving to and from a bed to a chair (including a wheelchair)?: A Lot Help needed standing up from a chair using your arms  (e.g., wheelchair or bedside chair)?: A Lot Help needed to walk in hospital room?: A Lot Help needed climbing 3-5 steps with a railing? : Total 6 Click Score: 12    End of Session Equipment Utilized During Treatment: Gait belt;Oxygen Activity Tolerance: Patient limited by fatigue Patient left: in bed;with call bell/phone within reach;with bed alarm set Nurse Communication: Mobility status PT Visit Diagnosis: Unsteadiness on feet (R26.81);Muscle weakness (generalized) (M62.81);Difficulty in walking, not elsewhere classified (R26.2)    Time: 5573-2202 PT Time Calculation (min) (ACUTE ONLY): 32 min   Charges:   PT Evaluation $PT Eval Moderate Complexity: [place] [facility] Acute Rehabilitation Services Pager [phone] Office [phone]   Clorinda Wyble 04/10/2020, 5:34 PM

## 2020-04-10 NOTE — Progress Notes (Signed)
PROGRESS NOTE    [name]  SAY:[id number] DOB: 1932/12/26 DOA: 04/07/2020 PCP: Binnie Rail, MD    Brief Narrative:  86 year old female with history of COPD on 3 L of oxygen at home, alcohol abuse, hypothyroidism came to ED with nausea and vomiting.  Patient had generalized tonic-clonic seizure in the ED.  She was found to have significant lab abnormalities including hypomagnesemia, magnesium 1.3, hypokalemia potassium 2.7, hyponatremia sodium 119.  BNP was 404, troponin elevated to 3200.  Covid test was negative.  Chest x-ray showed possible infiltrate.   Assessment & Plan:   Principal Problem:   Non-ST elevation MI (NSTEMI) ([place]) Active Problems:   Hypothyroidism   COPD mixed type (HCC)   Non-small cell carcinoma of lung, stage 1 (HCC)   Chronic respiratory failure with hypoxia (HCC)   Nausea & vomiting   Hyponatremia   Alcohol abuse  NSTEMI -Cardiology was consulted -Per Cardiology, continue ASA 81mg , Crestor 20mg .  -Losartan added by Cardiology today -Pt had been on heparin x 48hrs, now off -Per Cardiology, pending clinical improvement, can proceed with stress testing in the future as an outpatient  Seizure -likely from alcohol withdrawal versus metabolic abnormalities.   -Presenting sodium is 119, up to 121 today -EEG reviewed, unremarkable -Dr. Darrick Meigs discussed with neurology who did not recommend seizure meds -Remains seizure free  Alcohol withdrawal-patient drinks alcohol daily -Discussed with family in room. Last ETOH is believed to be on 10/17 -Suspect ETOH is related to below pna, suspect aspiration PNA -CIWA now 0. Will d/c CIWA  protocol  Pneumonia -Presenting CT chest obtained is negative for PE, however is pos for right lower lobe pneumonia, given presenting nausea/vomiting in the setting of ETOH abuse, consider aspiration PNA -Currently tolerating rocephin and azithromycin -continue chest PT  Hyponatremia -Presenting sodium is  119 -Sodium up to 121 today -Given EtOH hx, consider component of beer potomania -repeat bmet in AM  COPD exacerbation -continue Xopenex as needed -continues with wheezing on exam increased resp effort. Continue solumedrol  Hypothyroidism -Presenting TSH is elevated at 10.57, patient is on Synthroid 88 mcg at home.  -Synthroid was recently increased to 100 mcg daily.   -Free T4 was within normal limits -Possible element of sick euthyroid  Intractable nausea and vomiting --CT abdomen/pelvis is unremarkable.  Lipase 23.  Continue Zofran as needed for nausea vomiting. -suspect related to on-going ETOH abuse -Appreciate assistance by SLP. Tolerated clears. Advance to fulls   DVT prophylaxis: Lovenox subq Code Status: DNR Family Communication: Pt in room, pt's family at bedside  Status is: Inpatient  Remains inpatient appropriate because:Hemodynamically unstable, Altered mental status, Unsafe d/c plan, IV treatments appropriate due to intensity of illness or inability to take PO and Inpatient level of care appropriate due to severity of illness   Dispo: The patient is from: Home              Anticipated d/c is to: Unclear. Will need PT eval when pt is more stable              Anticipated d/c date is: > 3 days              Patient currently is not medically stable to d/c.  Consultants:   Cardiology  Procedures:     Antimicrobials: Anti-infectives (From admission, onward)   Start     Dose/Rate Route Frequency Ordered Stop   04/11/20 1000  azithromycin (ZITHROMAX) tablet 500 mg        500  mg Oral Daily 04/10/20 1420     04/08/20 1500  azithromycin (ZITHROMAX) 250 mg in dextrose 5 % 125 mL IVPB  Status:  Discontinued        250 mg 125 mL/hr over 60 Minutes Intravenous Every 24 hours 04/08/20 1402 04/08/20 1406   04/08/20 1500  azithromycin (ZITHROMAX) 500 mg in sodium chloride 0.9 % 250 mL IVPB  Status:  Discontinued        500 mg 250 mL/hr over 60 Minutes Intravenous  Every 24 hours 04/08/20 1407 04/10/20 1420   04/07/20 0600  cefTRIAXone (ROCEPHIN) 1 g in sodium chloride 0.9 % 100 mL IVPB        1 g 200 mL/hr over 30 Minutes Intravenous Every 24 hours 04/07/20 0553        Subjective: Eager to have solid food. Tolerating clears  Objective: Vitals:   04/10/20 0844 04/10/20 1210 04/10/20 1359 04/10/20 1400  BP:    137/63  Pulse:    (!) 103  Resp:    (!) 24  Temp:  97.9 F (36.6 C)    TempSrc:  Axillary    SpO2: 97%  96% 97%  Weight:      Height:        Intake/Output Summary (Last 24 hours) at 04/10/2020 1456 Last data filed at 04/10/2020 1432 Gross per 24 hour  Intake 3901.72 ml  Output 1450 ml  Net 2451.72 ml   Filed Weights   04/07/20 0244 04/09/20 0600  Weight: 60.4 kg 63.7 kg    Examination: General exam: Conversant, in no acute distress Respiratory system: coarse breath sounds, wheezing Cardiovascular system: regular rhythm, s1-s2 Gastrointestinal system: Nondistended, nontender, pos BS Central nervous system: No seizures, no tremors Extremities: No cyanosis, no joint deformities Skin: No rashes, no pallor Psychiatry: Affect normal // no auditory hallucinations   Data Reviewed: I have personally reviewed following labs and imaging studies  CBC: Recent Labs  Lab 04/07/20 0306 04/08/20 0529 04/09/20 0252 04/10/20 0248  WBC 13.1* 10.7* 9.2 8.6  HGB 10.3* 9.4* 9.0* 8.6*  HCT 32.1* 27.7* 26.7* 26.6*  MCV 99.1 93.9 95.0 98.5  PLT 188 146* 136* 188*   Basic Metabolic Panel: Recent Labs  Lab 04/07/20 0527 04/07/20 0527 04/07/20 1047 04/07/20 1334 04/08/20 0529 04/09/20 0252 04/10/20 0248  NA 119*   < > 120* 119* 120* 121* 121*  K 3.0*   < > 3.5 3.8 3.9 3.9 4.3  CL 83*   < > 90* 89* 86* 87* 92*  CO2 22   < > 23 24 23 23 24   GLUCOSE 116*   < > 97 103* 118* 128* 121*  BUN <5*   < > <5* <5* <5* 6* 7*  CREATININE 0.65   < > 0.52 0.60 0.72 0.79 0.72  CALCIUM 7.7*   < > 6.9* 7.5* 8.1* 8.0* 8.0*  MG 1.3*  --   --   2.0  --  1.9  --    < > = values in this interval not displayed.   GFR: Estimated Creatinine Clearance: 49 mL/min (by C-G formula based on SCr of 0.72 mg/dL). Liver Function Tests: Recent Labs  Lab 04/07/20 0306 04/07/20 0527 04/09/20 0252 04/10/20 0248  AST 103* 98* 98* 76*  ALT 39 34 37 34  ALKPHOS 163* 138* 126 112  BILITOT 1.1 1.0 0.9 0.6  PROT 6.5 5.8* 6.2* 5.6*  ALBUMIN 3.0* 2.5* 2.7* 2.4*   Recent Labs  Lab 04/07/20 0527 04/09/20 0252  LIPASE 23 25  No results for input(s): AMMONIA in the last 168 hours. Coagulation Profile: Recent Labs  Lab 04/07/20 0527  INR 1.2   Cardiac Enzymes: No results for input(s): CKTOTAL, CKMB, CKMBINDEX, TROPONINI in the last 168 hours. BNP (last 3 results) No results for input(s): PROBNP in the last 8760 hours. HbA1C: No results for input(s): HGBA1C in the last 72 hours. CBG: Recent Labs  Lab 04/07/20 0308  GLUCAP 165*   Lipid Profile: No results for input(s): CHOL, HDL, LDLCALC, TRIG, CHOLHDL, LDLDIRECT in the last 72 hours. Thyroid Function Tests: Recent Labs    04/10/20 0248  FREET4 0.69   Anemia Panel: No results for input(s): VITAMINB12, FOLATE, FERRITIN, TIBC, IRON, RETICCTPCT in the last 72 hours. Sepsis Labs: Recent Labs  Lab 04/07/20 0657 04/07/20 1047  LATICACIDVEN 3.6* 3.0*    Recent Results (from the past 240 hour(s))  Respiratory Panel by RT PCR (Flu A&B, Covid) - Nasopharyngeal Swab     Status: None   Collection Time: 04/07/20  4:30 AM   Specimen: Nasopharyngeal Swab  Result Value Ref Range Status   SARS Coronavirus 2 by RT PCR NEGATIVE NEGATIVE Final    Comment: (NOTE) SARS-CoV-2 target nucleic acids are NOT DETECTED.  The SARS-CoV-2 RNA is generally detectable in upper respiratoy specimens during the acute phase of infection. The lowest concentration of SARS-CoV-2 viral copies this assay can detect is 131 copies/mL. A negative result does not preclude SARS-Cov-2 infection and should not be  used as the sole basis for treatment or other patient management decisions. A negative result may occur with  improper specimen collection/handling, submission of specimen other than nasopharyngeal swab, presence of viral mutation(s) within the areas targeted by this assay, and inadequate number of viral copies (<131 copies/mL). A negative result must be combined with clinical observations, patient history, and epidemiological information. The expected result is Negative.  Fact Sheet for Patients:  PinkCheek.be  Fact Sheet for Healthcare Providers:  GravelBags.it  This test is no t yet approved or cleared by the [place] FDA and  has been authorized for detection and/or diagnosis of SARS-CoV-2 by FDA under an Emergency Use Authorization (EUA). This EUA will remain  in effect (meaning this test can be used) for the duration of the COVID-19 declaration under Section 564(b)(1) of the Act, 21 U.S.C. section 360bbb-3(b)(1), unless the authorization is terminated or revoked sooner.     Influenza A by PCR NEGATIVE NEGATIVE Final   Influenza B by PCR NEGATIVE NEGATIVE Final    Comment: (NOTE) The Xpert Xpress SARS-CoV-2/FLU/RSV assay is intended as an aid in  the diagnosis of influenza from Nasopharyngeal swab specimens and  should not be used as a sole basis for treatment. Nasal washings and  aspirates are unacceptable for Xpert Xpress SARS-CoV-2/FLU/RSV  testing.  Fact Sheet for Patients: PinkCheek.be  Fact Sheet for Healthcare Providers: GravelBags.it  This test is not yet approved or cleared by the [place] FDA and  has been authorized for detection and/or diagnosis of SARS-CoV-2 by  FDA under an Emergency Use Authorization (EUA). This EUA will remain  in effect (meaning this test can be used) for the duration of the  Covid-19 declaration under Section  564(b)(1) of the Act, 21  U.S.C. section 360bbb-3(b)(1), unless the authorization is  terminated or revoked. Performed at [facility], [place] [address], [place], [place] [address]   MRSA PCR Screening     Status: None   Collection Time: 04/07/20  5:29 PM   Specimen: Nasal  Mucosa; Nasopharyngeal  Result Value Ref Range Status   MRSA by PCR NEGATIVE NEGATIVE Final    Comment:        The GeneXpert MRSA Assay (FDA approved for NASAL specimens only), is one component of a comprehensive MRSA colonization surveillance program. It is not intended to diagnose MRSA infection nor to guide or monitor treatment for MRSA infections. Performed at [facility], [place] [address] [place], [place] [address]      Radiology Studies: No results found.  Scheduled Meds: . ALPRAZolam  1 mg Oral QHS  . aspirin  81 mg Oral Daily  . [START ON 04/11/2020] azithromycin  500 mg Oral Daily  . Chlorhexidine Gluconate Cloth  6 each Topical Daily  . enoxaparin (LOVENOX) injection  40 mg Subcutaneous Q24H  . fluticasone  2 puff Inhalation BID  . folic acid  1 mg Oral Daily  . levalbuterol  1.25 mg Nebulization TID  . levothyroxine  100 mcg Oral Q0600  . losartan  25 mg Oral Daily  . mouth rinse  15 mL Mouth Rinse BID  . methylPREDNISolone (SOLU-MEDROL) injection  40 mg Intravenous Q12H  . multivitamin with minerals  1 tablet Oral Daily  . rosuvastatin  20 mg Oral Daily  . thiamine  100 mg Oral Daily   Continuous Infusions: . sodium chloride Stopped (04/10/20 0558)  . cefTRIAXone (ROCEPHIN)  IV Stopped (04/10/20 5520)     LOS: 3 days   Marylu Lund, MD [facility] Pager On Amion  If 7PM-7AM, please contact night-coverage 04/10/2020, 2:56 PM

## 2020-04-10 NOTE — Evaluation (Signed)
Occupational Therapy Evaluation Patient Details Name: [name] MRN: [id number] DOB: 04-02-1933 Today's Date: 04/10/2020    History of Present Illness 86 y.o. female with history of severe COPD/emphysema on 3L [place], small cell lung cancer s/p right upper lobectomy in 2012, alcohol abuse with history of withdrawals who presented with nausea, vomiting and abdominal pain found to have NSTEMI with trop 3265 as well as significant hyponatremia. Now on medical management for NSTEMI.   Clinical Impression   Mrs. [name] is an 86 year old woman who presents with generalized weakness, decreased activity tolerance, poor balance and impaired cardiopulmonary status requiring 4 L  and use of NRB to recover sudden sat drop. Patient mod x 2 to for bed mobility and sit to stands. Patient max-total assist for ADLs at this time. Evaluation limited to side of bed at RN's request. Patient's daughter assists with PLOF questions and reports her husband has been assisting her with ADLs and her mobility is limited to transfers and use of wheelchair. Patient reports her mother will not want to go to rehab at discharge and has been receiving [facility] therapy. Patient will benefit from skilled OT services while in hospital to improve deficits, learn compensatory strategies for ADLs in order to improve functional abilities in order to reduce caregiver burden. At this time would recommend short term rehab at discharge.    Follow Up Recommendations  SNF    Equipment Recommendations  None recommended by OT    Recommendations for Other Services       Precautions / Restrictions Precautions Precautions: Fall Precaution Comments: o2 sats can drop, NRB to recover Restrictions Weight Bearing Restrictions: No      Mobility Bed Mobility Overal bed mobility: Needs Assistance Bed Mobility: Supine to Sit;Sit to Supine     Supine to sit: Mod assist;+2 for physical assistance;+2 for safety/equipment;HOB  elevated Sit to supine: Mod assist;+2 for physical assistance;+2 for safety/equipment        Transfers Overall transfer level: Needs assistance Equipment used: 2 person hand held assist Transfers: Sit to/from Stand Sit to Stand: +2 physical assistance;Mod assist;From elevated surface              Balance Overall balance assessment: History of Falls;Needs assistance Sitting-balance support: No upper extremity supported;Feet supported Sitting balance-Leahy Scale: Fair     Standing balance support: Bilateral upper extremity supported;During functional activity Standing balance-Leahy Scale: Poor Standing balance comment: requires upper extremity support                           ADL either performed or assessed with clinical judgement   ADL Overall ADL's : Needs assistance/impaired Eating/Feeding: Set up;Bed level   Grooming: Set up;Bed level   Upper Body Bathing: Moderate assistance;Set up;Bed level   Lower Body Bathing: Set up;Maximal assistance;Bed level   Upper Body Dressing : Moderate assistance;Set up;Bed level   Lower Body Dressing: Total assistance;Bed level   Toilet Transfer: Moderate assistance;+2 for physical assistance;+2 for safety/equipment;BSC;Stand-pivot   Toileting- Clothing Manipulation and Hygiene: Total assistance;Sit to/from stand               Vision   Vision Assessment?: No apparent visual deficits     Perception     Praxis      Pertinent Vitals/Pain Pain Assessment: No/denies pain     Hand Dominance Right   Extremity/Trunk Assessment Upper Extremity Assessment Upper Extremity Assessment: Generalized weakness   Lower Extremity Assessment Lower Extremity Assessment:  Defer to PT evaluation   Cervical / Trunk Assessment Cervical / Trunk Assessment: Kyphotic   Communication     Cognition Arousal/Alertness: Awake/alert Behavior During Therapy: WFL for tasks assessed/performed Overall Cognitive Status: Within  Functional Limits for tasks assessed                                     General Comments       Exercises     Shoulder Instructions      Home Living Family/patient expects to be discharged to:: Unsure Living Arrangements: Spouse/significant other Available Help at Discharge: Available 24 hours/day;Family (husband has cognitive decline but mobile) Type of Home: House Home Access: Stairs to enter;Ramped entrance Entrance Stairs-Number of Steps: 3   Home Layout: One level     Bathroom Shower/Tub: Tub/shower unit         Home Equipment: Wheelchair - manual;Grab bars - tub/shower;Grab bars - toilet;Bedside commode;Shower seat   Additional Comments: had a fall in june and predominantly just pivots for transfers since. spouse pushes wc.      Prior Functioning/Environment Level of Independence: Needs assistance  Gait / Transfers Assistance Needed: just transfers ADL's / Homemaking Assistance Needed: Husband assists with dressing, bathing, toileting, performs sponge bath   Comments: daughter checks in on parents daily        OT Problem List: Decreased strength;Decreased activity tolerance;Impaired balance (sitting and/or standing);Decreased knowledge of use of DME or AE;Pain      OT Treatment/Interventions: Self-care/ADL training;Therapeutic exercise;DME and/or AE instruction;Therapeutic activities;Patient/family education;Balance training    OT Goals(Current goals can be found in the care plan section) Acute Rehab OT Goals Patient Stated Goal: To get strength back OT Goal Formulation: With family Time For Goal Achievement: 04/24/20 Potential to Achieve Goals: Fair  OT Frequency: Min 2X/week   Barriers to D/C:            Co-evaluation PT/OT/SLP Co-Evaluation/Treatment: Yes Reason for Co-Treatment: For patient/therapist safety;To address functional/ADL transfers PT goals addressed during session: Mobility/safety with mobility OT goals addressed  during session: ADL's and self-care      AM-PAC OT "6 Clicks" Daily Activity     Outcome Measure Help from another person eating meals?: A Little Help from another person taking care of personal grooming?: A Little Help from another person toileting, which includes using toliet, bedpan, or urinal?: Total Help from another person bathing (including washing, rinsing, drying)?: A Lot Help from another person to put on and taking off regular upper body clothing?: A Lot Help from another person to put on and taking off regular lower body clothing?: Total 6 Click Score: 12   End of Session Equipment Utilized During Treatment: Oxygen Nurse Communication: Mobility status  Activity Tolerance: Patient tolerated treatment well Patient left: in bed;with call bell/phone within reach  OT Visit Diagnosis: Unsteadiness on feet (R26.81);Muscle weakness (generalized) (M62.81)                Time: 6213-0865 OT Time Calculation (min): 32 min Charges:  OT General Charges $OT Visit: 1 Visit OT Evaluation $OT Eval Moderate Complexity: 1 Mod  Gil Ingwersen, OTR/L [place]  Office [phone] Pager: [place] 04/10/2020, 2:06 PM

## 2020-04-10 NOTE — Progress Notes (Signed)
PHARMACIST - PHYSICIAN COMMUNICATION  CONCERNING: Antibiotic IV to Oral Route Change Policy  RECOMMENDATION: This patient is receiving azithromycin by the intravenous route.  Based on criteria approved by the Pharmacy and Therapeutics Committee, the antibiotic(s) is/are being converted to the equivalent oral dose form(s).   DESCRIPTION: These criteria include:  Patient being treated for a respiratory tract infection, urinary tract infection, cellulitis or clostridium difficile associated diarrhea if on metronidazole  The patient is not neutropenic and does not exhibit a GI malabsorption state  The patient is eating (either orally or via tube) and/or has been taking other orally administered medications for a least 24 hours  The patient is improving clinically and has a Tmax < 100.5  If you have questions about this conversion, please contact the Pharmacy Department  []  ( [phone] )  [place] []  ( [phone] )  [facility] []  ( [phone] )  [place] []  ( [phone] )  [facility] [x]  ( [phone] )  [facility]  

## 2020-04-10 NOTE — Progress Notes (Signed)
SLP Cancellation Note  Patient Details Name: [name] MRN: [id number] DOB: 1933-01-01   Cancelled treatment:       Reason Eval/Treat Not Completed: Other (comment) (pt with RN and NT getting cleaned, will continue efforts. SLP spoke to RN regarding pt's swallowing prior to him assisting NT.  RN reports pt is rhonchorous with frequent cough with decreased ability to differentiate if occurring more with po or without. He did report concern for possible increase in coughing after intake.  To assure pt is not overtly aspirating, recommend to proceed with MBS.  Suspect possible multifactorial dysphagia with h/o COPD and ETOH use.  Will follow up next date. )  Kathleen Lime, MS [facility] SLP [place] Office [phone] Pager [phone]    [name] 04/10/2020, 6:37 PM

## 2020-04-10 NOTE — Progress Notes (Signed)
Progress Note  Patient Name: [name] Date of Encounter: 04/10/2020  Primary Cardiologist: No primary care provider on file.   Subjective  Has continued cough, SOB, wheezing and labored breathing.  Na 121 from peak 128 yesterday. Inpatient Medications    Scheduled Meds: . ALPRAZolam  1 mg Oral QHS  . aspirin  81 mg Oral Daily  . Chlorhexidine Gluconate Cloth  6 each Topical Daily  . enoxaparin (LOVENOX) injection  40 mg Subcutaneous Q24H  . fluticasone  2 puff Inhalation BID  . folic acid  1 mg Oral Daily  . levalbuterol  1.25 mg Nebulization TID  . levothyroxine  100 mcg Oral Q0600  . mouth rinse  15 mL Mouth Rinse BID  . methylPREDNISolone (SOLU-MEDROL) injection  40 mg Intravenous Q12H  . multivitamin with minerals  1 tablet Oral Daily  . rosuvastatin  20 mg Oral Daily  . thiamine  100 mg Oral Daily   Or  . thiamine  100 mg Intravenous Daily   Continuous Infusions: . sodium chloride Stopped (04/10/20 0558)  . azithromycin Stopped (04/09/20 1607)  . cefTRIAXone (ROCEPHIN)  IV Stopped (04/10/20 3662)   PRN Meds: guaiFENesin, levalbuterol, morphine injection, ondansetron (ZOFRAN) IV   Vital Signs    Vitals:   04/10/20 0600 04/10/20 0821 04/10/20 0844 04/10/20 1210  BP: (!) 171/88     Pulse: 97     Resp: 16     Temp:  (!) 97.4 F (36.3 C)  97.9 F (36.6 C)  TempSrc:  Oral  Axillary  SpO2: 100% 97% 97%   Weight:      Height:        Intake/Output Summary (Last 24 hours) at 04/10/2020 1300 Last data filed at 04/10/2020 0600 Gross per 24 hour  Intake 3451.72 ml  Output 900 ml  Net 2551.72 ml   Filed Weights   04/07/20 0244 04/09/20 0600  Weight: 60.4 kg 63.7 kg    Telemetry    Sinus rhythm, sinus tach - Personally Reviewed  ECG    No new tracings - Personally Reviewed  Physical Exam   GEN: Labored breathing with expiratory wheezing Neck: +JVD Cardiac: Tachycardic, regular, no murmurs Respiratory: Labored breathing. Diffuse rhonchi  and very loud wheezing. GI: NABS, Soft, nontender, non-distended  MS: Warm, no edema Neuro:  Nonfocal, moving all extremities spontaneously Psych: Normal affect   Labs    Chemistry Recent Labs  Lab 04/07/20 0527 04/07/20 1047 04/08/20 0529 04/09/20 0252 04/10/20 0248  NA 119*   < > 120* 121* 121*  K 3.0*   < > 3.9 3.9 4.3  CL 83*   < > 86* 87* 92*  CO2 22   < > 23 23 24   GLUCOSE 116*   < > 118* 128* 121*  BUN <5*   < > <5* 6* 7*  CREATININE 0.65   < > 0.72 0.79 0.72  CALCIUM 7.7*   < > 8.1* 8.0* 8.0*  PROT 5.8*  --   --  6.2* 5.6*  ALBUMIN 2.5*  --   --  2.7* 2.4*  AST 98*  --   --  98* 76*  ALT 34  --   --  37 34  ALKPHOS 138*  --   --  126 112  BILITOT 1.0  --   --  0.9 0.6  GFRNONAA >60   < > >60 >60 >60  ANIONGAP 14   < > 11 11 5    < > = values in this  interval not displayed.     Hematology Recent Labs  Lab 04/08/20 0529 04/09/20 0252 04/10/20 0248  WBC 10.7* 9.2 8.6  RBC 2.95* 2.81* 2.70*  HGB 9.4* 9.0* 8.6*  HCT 27.7* 26.7* 26.6*  MCV 93.9 95.0 98.5  MCH 31.9 32.0 31.9  MCHC 33.9 33.7 32.3  RDW 12.6 12.8 13.3  PLT 146* 136* 125*    Cardiac EnzymesNo results for input(s): TROPONINI in the last 168 hours. No results for input(s): TROPIPOC in the last 168 hours.   BNP Recent Labs  Lab 04/07/20 0320  BNP 404.1*     DDimer  Recent Labs  Lab 04/07/20 0340  DDIMER 2.91*     Radiology    No results found.  Cardiac Studies   Echocardiogram 04/07/20: 1. Left ventricular ejection fraction, by estimation, is 45 to 50%. The  left ventricle has mildly decreased function. The left ventricle  demonstrates global hypokinesis. Left ventricular diastolic parameters are  consistent with Grade I diastolic  dysfunction (impaired relaxation).  2. Right ventricular systolic function was not well visualized. The right  ventricular size is not well visualized.  3. The mitral valve was not well visualized. No evidence of mitral valve  regurgitation.    4. The aortic valve was not well visualized. Aortic valve regurgitation  is not visualized.  5. The inferior vena cava is dilated in size with <50% respiratory  variability, suggesting right atrial pressure of 15 mmHg.   Patient Profile     86 y.o. female with history of severe COPD/emphysema on 3L [place], small cell lung cancer s/p right upper lobectomy in 2012, alcohol abuse with history of withdrawals who presented with nausea, vomiting and abdominal pain found to have NSTEMI with trop 3265 as well as significant hyponatremia. Now on medical management for NSTEMI.   Assessment & Plan    1. NSTEMI in patient with coronary artery calcifications on CT: patient presented with AMS, nausea, and vomiting, found to have hyponatremia with Na 119. HsTrop elevated to 3400 and trended down. EKG was non-ischemic. She had no chest pain complaints. Was noted to have significant coronary artery calcifications on CT scan. Echo this admission with EF 40-45% with global hypokinesis. She was maintained on a heparin gtt x48 hours for medical management of her NSTEMI. BBlocker not started due to severe COPD and ongoing wheezing. Blood pressure initially soft limiting addition of ACEi/ARB, though now has been persistently elevated x12 hours.   - Continue aspirin and statin - Will start low dose losartan today - Can consider outpatient ischemic testing with a NST after she recovers from her present illness if in line with GOC  2. Acute combined CHF: found to have EF 40-45% with global hypokinesis on echo this admission. Presumed ischemic in nature given elevated trops and significant coronary artery calcifications on CT this admission. Decision made to treat medically. She continues to have hyponatremia limiting ability to diurese. Severe COPD limits addition of BBlocker - Will start low dose losartan today  3. Hyponatremia in the setting of chronic alcohol abuse: patient presented with AMS, poor po intake, and N/V.  Na 119 on admission. Now 121. - Continue management per primary team  4. Aspiration PNA: found to have RLL PNA on CT chest this admission, c/f aspiration given ETOH abuse and presenting symptoms of N/V. Currently on IV antibiotics - Continue management per primary team  5. COPD exacerbation: on 3L home O2. Started on supportive care with nebulizers and steroids - Continue management per primary  team  6. HLD: LDL 81 10/2019. Home atorvastatin 20mg  TIW was transitioned to crestor 20mg  daily.  - Continue crestor - Will need repeat FLP/LFTs in 6-8 weeks for close monitoring   We will sign-off at this time. Please call us back if sodium improved and need assistance with diuresis. Will arrange for follow-up with [place] as an out-patient for continued management of CAD/NSTEMI.  For questions or updates, please contact [place] Please consult [email or web address] for contact info under Cardiology/STEMI.      Signed, Freada Bergeron, MD  04/10/2020, 1:00 PM   [phone]

## 2020-04-11 ENCOUNTER — Inpatient Hospital Stay (HOSPITAL_COMMUNITY): Payer: PPO

## 2020-04-11 DIAGNOSIS — J9611 Chronic respiratory failure with hypoxia: Secondary | ICD-10-CM

## 2020-04-11 LAB — COMPREHENSIVE METABOLIC PANEL
ALT: 35 U/L (ref 0–44)
AST: 70 U/L — ABNORMAL HIGH (ref 15–41)
Albumin: 2.4 g/dL — ABNORMAL LOW (ref 3.5–5.0)
Alkaline Phosphatase: 108 U/L (ref 38–126)
Anion gap: 6 (ref 5–15)
BUN: 7 mg/dL — ABNORMAL LOW (ref 8–23)
CO2: 23 mmol/L (ref 22–32)
Calcium: 8.1 mg/dL — ABNORMAL LOW (ref 8.9–10.3)
Chloride: 94 mmol/L — ABNORMAL LOW (ref 98–111)
Creatinine, Ser: 0.61 mg/dL (ref 0.44–1.00)
GFR, Estimated: >60 mL/min (ref >60)
Glucose, Bld: 116 mg/dL — ABNORMAL HIGH (ref 70–99)
Potassium: 4.3 mmol/L (ref 3.5–5.1)
Sodium: 123 mmol/L — ABNORMAL LOW (ref 135–145)
Total Bilirubin: 0.4 mg/dL (ref 0.3–1.2)
Total Protein: 5.6 g/dL — ABNORMAL LOW (ref 6.5–8.1)

## 2020-04-11 LAB — CBC
HCT: 27.2 % — ABNORMAL LOW (ref 36.0–46.0)
Hemoglobin: 9.1 g/dL — ABNORMAL LOW (ref 12.0–15.0)
MCH: 32.4 pg (ref 26.0–34.0)
MCHC: 33.5 g/dL (ref 30.0–36.0)
MCV: 96.8 fL (ref 80.0–100.0)
Platelets: 141 10*3/uL — ABNORMAL LOW (ref 150–400)
RBC: 2.81 MIL/uL — ABNORMAL LOW (ref 3.87–5.11)
RDW: 13.2 % (ref 11.5–15.5)
WBC: 7.7 10*3/uL (ref 4.0–10.5)
nRBC: 0.4 % — ABNORMAL HIGH (ref 0.0–0.2)

## 2020-04-11 LAB — MAGNESIUM: Magnesium: 1.9 mg/dL (ref 1.7–2.4)

## 2020-04-11 MED ORDER — FUROSEMIDE 10 MG/ML IJ SOLN
40.0000 mg | Freq: Every day | INTRAMUSCULAR | Status: DC
  Administered 2020-04-11: 40 mg via INTRAVENOUS
  Filled 2020-04-11: qty 4

## 2020-04-11 NOTE — Progress Notes (Signed)
Modified Barium Swallow Progress Note  Patient Details  Name: [name] MRN: [id number] Date of Birth: 1932-08-21  Today's Date: 04/11/2020  Modified Barium Swallow completed.  Full report located under Chart Review in the Imaging Section.  Brief recommendations include the following:  Clinical Impression  Patient presents with a mild oral and a mild-mod pharyngeal dysphagia without aspiration of any tested consistencies (thin, puree, regular solids, barium tablet). She exhibited flash penetration during the swallow with thin liquids via straw sips, mixed consistencies (sips of liquids during regular solids intake) but full clearance of penetrate observed each time. Patient with swallow initiation delay at level of vallecular sinus with thin liquids and exhbiited trace to minimal vallecular sinus residuals with puree solids, regular solids and trace residuals with thin liquids which fully cleared after subsequent, uncued swallows. Presence of two cervical osteophytes (no radiologist to confirm) impeding into pharynx but not significantly impacting pharyngeal phase of swallow. Aspiration risk related to dysphagia is in mild range however her COPD/respiratory issues do put her at a higher risk and patient is advised to monitor respiratory rate and oxygen saturations and take rest breaks when eating.   Swallow Evaluation Recommendations       SLP Diet Recommendations: Dysphagia 3 (Mech soft) solids;Thin liquid   Liquid Administration via: Cup;Straw   Medication Administration: Whole meds with liquid   Supervision: Patient able to self feed;Intermittent supervision to cue for compensatory strategies   Compensations: Minimize environmental distractions;Small sips/bites;Slow rate;Other (Comment) (rest breaks, watch for RR and oxygen saturations)       Oral Care Recommendations: Oral care BID      Sonia Baller, MA, CCC-SLP Speech Therapy

## 2020-04-11 NOTE — Progress Notes (Signed)
RT NOTE: CPT held for 1400 due to patient unavailable. RT attempted x2. RT will attempt at next scheduled time and continue to monitor as needed.

## 2020-04-11 NOTE — Progress Notes (Signed)
PROGRESS NOTE    [name]  PFX:[id number] DOB: 03/12/1933 DOA: 04/07/2020 PCP: Binnie Rail, MD    Brief Narrative:  86 year old female with history of COPD on 3 L of oxygen at home, alcohol abuse, hypothyroidism came to ED with nausea and vomiting.  Patient had generalized tonic-clonic seizure in the ED.  She was found to have significant lab abnormalities including hypomagnesemia, magnesium 1.3, hypokalemia potassium 2.7, hyponatremia sodium 119.  BNP was 404, troponin elevated to 3200.  Covid test was negative.  Chest x-ray showed possible infiltrate.   Assessment & Plan:   Principal Problem:   Non-ST elevation MI (NSTEMI) ([place]) Active Problems:   Hypothyroidism   COPD mixed type (HCC)   Non-small cell carcinoma of lung, stage 1 (HCC)   Chronic respiratory failure with hypoxia (HCC)   Nausea & vomiting   Hyponatremia   Alcohol abuse  NSTEMI -Cardiology was consulted -Per Cardiology, continue ASA 81mg , Crestor 20mg .  -Losartan added by Cardiology today -Pt had been on heparin x 48hrs, now off -Per Cardiology, pending clinical improvement, can proceed with stress testing in the future as an outpatient. Cardiology has since signed off  Seizure -likely from alcohol withdrawal versus metabolic abnormalities.   -Presenting sodium is 119, up to 121 today -EEG reviewed, unremarkable -Dr. Darrick Meigs discussed with neurology who did not recommend seizure meds -Remains seizure free at this time  Alcohol withdrawal-patient drinks alcohol daily -Discussed with family in room. Last ETOH is believed to be on 10/17 -Suspect ETOH is related to below pna, suspect aspiration PNA -CIWA now 0. Now off CIWA -Family reports multiple bottles of brandy and other alcoholic beverages were found in the home, currently being discarded by family while pt is in hospital -Pt historically has been hiding alcohol around the house, such as the closet, without family  knowledge  Pneumonia -Presenting CT chest obtained is negative for PE, however is pos for right lower lobe pneumonia, given presenting nausea/vomiting in the setting of ETOH abuse, consider aspiration PNA -Currently tolerating rocephin and azithromycin -continue with chest PT  Hyponatremia -Presenting sodium is 119 -Sodium up to 121 today -Given EtOH hx, consider component of beer potomania -recheck bmet in AM  COPD exacerbation -continue Xopenex as needed -continues with wheezing on exam increased resp effort. Continue solumedrol  Hypothyroidism -Presenting TSH is elevated at 10.57, patient is on Synthroid 88 mcg at home.  -Synthroid was recently increased to 100 mcg daily.   -Free T4 was within normal limits -Possible element of sick euthyroid  Intractable nausea and vomiting --CT abdomen/pelvis is unremarkable.  Lipase 23.  Continue Zofran as needed for nausea vomiting. -suspect related to on-going ETOH abuse -Appreciate assistance by SLP. MBS study obtained, pending results   DVT prophylaxis: Lovenox subq Code Status: DNR Family Communication: Pt in room, pt's family at bedside  Status is: Inpatient  Remains inpatient appropriate because:Hemodynamically unstable, Altered mental status, Unsafe d/c plan, IV treatments appropriate due to intensity of illness or inability to take PO and Inpatient level of care appropriate due to severity of illness   Dispo: The patient is from: Home              Anticipated d/c is to: Unclear. Will need PT eval when pt is more stable              Anticipated d/c date is: > 3 days              Patient currently is not medically stable  to d/c.  Consultants:   Cardiology  Procedures:     Antimicrobials: Anti-infectives (From admission, onward)   Start     Dose/Rate Route Frequency Ordered Stop   04/11/20 1000  azithromycin (ZITHROMAX) tablet 500 mg        500 mg Oral Daily 04/10/20 1420     04/08/20 1500  azithromycin  (ZITHROMAX) 250 mg in dextrose 5 % 125 mL IVPB  Status:  Discontinued        250 mg 125 mL/hr over 60 Minutes Intravenous Every 24 hours 04/08/20 1402 04/08/20 1406   04/08/20 1500  azithromycin (ZITHROMAX) 500 mg in sodium chloride 0.9 % 250 mL IVPB  Status:  Discontinued        500 mg 250 mL/hr over 60 Minutes Intravenous Every 24 hours 04/08/20 1407 04/10/20 1420   04/07/20 0600  cefTRIAXone (ROCEPHIN) 1 g in sodium chloride 0.9 % 100 mL IVPB        1 g 200 mL/hr over 30 Minutes Intravenous Every 24 hours 04/07/20 0553        Subjective: Coughing this am  Objective: Vitals:   04/11/20 0600 04/11/20 0800 04/11/20 0905 04/11/20 0908  BP: (!) 143/78     Pulse: 97     Resp: (!) 22     Temp:  (!) 97 F (36.1 C)    TempSrc:  Axillary    SpO2: 100%  97% 98%  Weight:      Height:        Intake/Output Summary (Last 24 hours) at 04/11/2020 1356 Last data filed at 04/11/2020 1100 Gross per 24 hour  Intake 1136.8 ml  Output 2250 ml  Net -1113.2 ml   Filed Weights   04/07/20 0244 04/09/20 0600 04/11/20 0558  Weight: 60.4 kg 63.7 kg 75.3 kg    Examination: General exam: Awake, laying in bed, in nad Respiratory system: coarse breath sounds. Actively coughing Cardiovascular system: regular rate, s1, s2 Gastrointestinal system: Soft, nondistended, positive BS Central nervous system: CN2-12 grossly intact, strength intact Extremities: Perfused, no clubbing Skin: Normal skin turgor, no notable skin lesions seen Psychiatry: Mood normal // no visual hallucinations   Data Reviewed: I have personally reviewed following labs and imaging studies  CBC: Recent Labs  Lab 04/07/20 0306 04/08/20 0529 04/09/20 0252 04/10/20 0248 04/11/20 0246  WBC 13.1* 10.7* 9.2 8.6 7.7  HGB 10.3* 9.4* 9.0* 8.6* 9.1*  HCT 32.1* 27.7* 26.7* 26.6* 27.2*  MCV 99.1 93.9 95.0 98.5 96.8  PLT 188 146* 136* 125* 427*   Basic Metabolic Panel: Recent Labs  Lab 04/07/20 0527 04/07/20 1047  04/07/20 1334 04/08/20 0529 04/09/20 0252 04/10/20 0248 04/11/20 0246  NA 119*   < > 119* 120* 121* 121* 123*  K 3.0*   < > 3.8 3.9 3.9 4.3 4.3  CL 83*   < > 89* 86* 87* 92* 94*  CO2 22   < > 24 23 23 24 23   GLUCOSE 116*   < > 103* 118* 128* 121* 116*  BUN <5*   < > <5* <5* 6* 7* 7*  CREATININE 0.65   < > 0.60 0.72 0.79 0.72 0.61  CALCIUM 7.7*   < > 7.5* 8.1* 8.0* 8.0* 8.1*  MG 1.3*  --  2.0  --  1.9  --  1.9   < > = values in this interval not displayed.   GFR: Estimated Creatinine Clearance: 54.3 mL/min (by C-G formula based on SCr of 0.61 mg/dL). Liver Function Tests: Recent Labs  Lab 04/07/20 0306 04/07/20 0527 04/09/20 0252 04/10/20 0248 04/11/20 0246  AST 103* 98* 98* 76* 70*  ALT 39 34 37 34 35  ALKPHOS 163* 138* 126 112 108  BILITOT 1.1 1.0 0.9 0.6 0.4  PROT 6.5 5.8* 6.2* 5.6* 5.6*  ALBUMIN 3.0* 2.5* 2.7* 2.4* 2.4*   Recent Labs  Lab 04/07/20 0527 04/09/20 0252  LIPASE 23 25   No results for input(s): AMMONIA in the last 168 hours. Coagulation Profile: Recent Labs  Lab 04/07/20 0527  INR 1.2   Cardiac Enzymes: No results for input(s): CKTOTAL, CKMB, CKMBINDEX, TROPONINI in the last 168 hours. BNP (last 3 results) No results for input(s): PROBNP in the last 8760 hours. HbA1C: No results for input(s): HGBA1C in the last 72 hours. CBG: Recent Labs  Lab 04/07/20 0308  GLUCAP 165*   Lipid Profile: No results for input(s): CHOL, HDL, LDLCALC, TRIG, CHOLHDL, LDLDIRECT in the last 72 hours. Thyroid Function Tests: Recent Labs    04/10/20 0248  FREET4 0.69   Anemia Panel: No results for input(s): VITAMINB12, FOLATE, FERRITIN, TIBC, IRON, RETICCTPCT in the last 72 hours. Sepsis Labs: Recent Labs  Lab 04/07/20 0657 04/07/20 1047  LATICACIDVEN 3.6* 3.0*    Recent Results (from the past 240 hour(s))  Respiratory Panel by RT PCR (Flu A&B, Covid) - Nasopharyngeal Swab     Status: None   Collection Time: 04/07/20  4:30 AM   Specimen:  Nasopharyngeal Swab  Result Value Ref Range Status   SARS Coronavirus 2 by RT PCR NEGATIVE NEGATIVE Final    Comment: (NOTE) SARS-CoV-2 target nucleic acids are NOT DETECTED.  The SARS-CoV-2 RNA is generally detectable in upper respiratoy specimens during the acute phase of infection. The lowest concentration of SARS-CoV-2 viral copies this assay can detect is 131 copies/mL. A negative result does not preclude SARS-Cov-2 infection and should not be used as the sole basis for treatment or other patient management decisions. A negative result may occur with  improper specimen collection/handling, submission of specimen other than nasopharyngeal swab, presence of viral mutation(s) within the areas targeted by this assay, and inadequate number of viral copies (<131 copies/mL). A negative result must be combined with clinical observations, patient history, and epidemiological information. The expected result is Negative.  Fact Sheet for Patients:  PinkCheek.be  Fact Sheet for Healthcare Providers:  GravelBags.it  This test is no t yet approved or cleared by the [place] FDA and  has been authorized for detection and/or diagnosis of SARS-CoV-2 by FDA under an Emergency Use Authorization (EUA). This EUA will remain  in effect (meaning this test can be used) for the duration of the COVID-19 declaration under Section 564(b)(1) of the Act, 21 U.S.C. section 360bbb-3(b)(1), unless the authorization is terminated or revoked sooner.     Influenza A by PCR NEGATIVE NEGATIVE Final   Influenza B by PCR NEGATIVE NEGATIVE Final    Comment: (NOTE) The Xpert Xpress SARS-CoV-2/FLU/RSV assay is intended as an aid in  the diagnosis of influenza from Nasopharyngeal swab specimens and  should not be used as a sole basis for treatment. Nasal washings and  aspirates are unacceptable for Xpert Xpress SARS-CoV-2/FLU/RSV  testing.  Fact Sheet  for Patients: PinkCheek.be  Fact Sheet for Healthcare Providers: GravelBags.it  This test is not yet approved or cleared by the [place] FDA and  has been authorized for detection and/or diagnosis of SARS-CoV-2 by  FDA under an Emergency Use Authorization (EUA). This EUA will remain  in effect (  meaning this test can be used) for the duration of the  Covid-19 declaration under Section 564(b)(1) of the Act, 21  U.S.C. section 360bbb-3(b)(1), unless the authorization is  terminated or revoked. Performed at [facility], [place] [address] [place], [place] [address]   MRSA PCR Screening     Status: None   Collection Time: 04/07/20  5:29 PM   Specimen: Nasal Mucosa; Nasopharyngeal  Result Value Ref Range Status   MRSA by PCR NEGATIVE NEGATIVE Final    Comment:        The GeneXpert MRSA Assay (FDA approved for NASAL specimens only), is one component of a comprehensive MRSA colonization surveillance program. It is not intended to diagnose MRSA infection nor to guide or monitor treatment for MRSA infections. Performed at [facility], [place] [address], [place], [place] [address]      Radiology Studies: No results found.  Scheduled Meds: . ALPRAZolam  1 mg Oral QHS  . aspirin  81 mg Oral Daily  . azithromycin  500 mg Oral Daily  . Chlorhexidine Gluconate Cloth  6 each Topical Daily  . enoxaparin (LOVENOX) injection  40 mg Subcutaneous Q24H  . fluticasone  2 puff Inhalation BID  . folic acid  1 mg Oral Daily  . furosemide  40 mg Intravenous Daily  . levalbuterol  1.25 mg Nebulization TID  . levothyroxine  100 mcg Oral Q0600  . losartan  25 mg Oral Daily  . mouth rinse  15 mL Mouth Rinse BID  . methylPREDNISolone (SOLU-MEDROL) injection  40 mg Intravenous Q12H  . multivitamin with minerals  1 tablet Oral Daily  . rosuvastatin  20 mg Oral Daily  . thiamine  100 mg Oral Daily    Continuous Infusions: . cefTRIAXone (ROCEPHIN)  IV Stopped (04/11/20 0719)     LOS: 4 days   Marylu Lund, MD [facility] Pager On Amion  If 7PM-7AM, please contact night-coverage 04/11/2020, 1:56 PM

## 2020-04-11 NOTE — Plan of Care (Addendum)
-   Placed on venti mask at 40% FIO2 while asleep due to mouth breathing.  Problem: Education: Goal: Knowledge of General Education information will improve Description: Including pain rating scale, medication(s)/side effects and non-pharmacologic comfort measures Outcome: Progressing   Problem: Health Behavior/Discharge Planning: Goal: Ability to manage health-related needs will improve Outcome: Progressing   Problem: Clinical Measurements: Goal: Ability to maintain clinical measurements within normal limits will improve Outcome: Progressing Goal: Will remain free from infection Outcome: Progressing Goal: Diagnostic test results will improve Outcome: Progressing Goal: Respiratory complications will improve Outcome: Progressing Goal: Cardiovascular complication will be avoided Outcome: Progressing   Problem: Activity: Goal: Risk for activity intolerance will decrease Outcome: Progressing   Problem: Nutrition: Goal: Adequate nutrition will be maintained Outcome: Progressing   Problem: Coping: Goal: Level of anxiety will decrease Outcome: Progressing   Problem: Elimination: Goal: Will not experience complications related to bowel motility Outcome: Progressing Goal: Will not experience complications related to urinary retention Outcome: Progressing   Problem: Pain Managment: Goal: General experience of comfort will improve Outcome: Progressing   Problem: Safety: Goal: Ability to remain free from injury will improve Outcome: Progressing   Problem: Skin Integrity: Goal: Risk for impaired skin integrity will decrease Outcome: Progressing

## 2020-04-12 LAB — CBC
HCT: 26.8 % — ABNORMAL LOW (ref 36.0–46.0)
Hemoglobin: 8.6 g/dL — ABNORMAL LOW (ref 12.0–15.0)
MCH: 32.2 pg (ref 26.0–34.0)
MCHC: 32.1 g/dL (ref 30.0–36.0)
MCV: 100.4 fL — ABNORMAL HIGH (ref 80.0–100.0)
Platelets: 160 10*3/uL (ref 150–400)
RBC: 2.67 MIL/uL — ABNORMAL LOW (ref 3.87–5.11)
RDW: 13.9 % (ref 11.5–15.5)
WBC: 5.7 10*3/uL (ref 4.0–10.5)
nRBC: 0.4 % — ABNORMAL HIGH (ref 0.0–0.2)

## 2020-04-12 LAB — COMPREHENSIVE METABOLIC PANEL
ALT: 33 U/L (ref 0–44)
AST: 56 U/L — ABNORMAL HIGH (ref 15–41)
Albumin: 2.2 g/dL — ABNORMAL LOW (ref 3.5–5.0)
Alkaline Phosphatase: 99 U/L (ref 38–126)
Anion gap: 6 (ref 5–15)
BUN: 6 mg/dL — ABNORMAL LOW (ref 8–23)
CO2: 31 mmol/L (ref 22–32)
Calcium: 8.4 mg/dL — ABNORMAL LOW (ref 8.9–10.3)
Chloride: 94 mmol/L — ABNORMAL LOW (ref 98–111)
Creatinine, Ser: 0.68 mg/dL (ref 0.44–1.00)
GFR, Estimated: >60 mL/min (ref >60)
Glucose, Bld: 123 mg/dL — ABNORMAL HIGH (ref 70–99)
Potassium: 3.7 mmol/L (ref 3.5–5.1)
Sodium: 131 mmol/L — ABNORMAL LOW (ref 135–145)
Total Bilirubin: 0.4 mg/dL (ref 0.3–1.2)
Total Protein: 5.1 g/dL — ABNORMAL LOW (ref 6.5–8.1)

## 2020-04-12 MED ORDER — FUROSEMIDE 10 MG/ML IJ SOLN
40.0000 mg | Freq: Two times a day (BID) | INTRAMUSCULAR | Status: DC
  Administered 2020-04-12 – 2020-04-15 (×8): 40 mg via INTRAVENOUS
  Filled 2020-04-12 (×8): qty 4

## 2020-04-12 MED ORDER — FUROSEMIDE 10 MG/ML IJ SOLN: 40.0000 mg | Freq: Two times a day (BID) | INTRAMUSCULAR | Status: DC

## 2020-04-12 NOTE — NC FL2 (Signed)
[place] LEVEL OF CARE SCREENING TOOL     IDENTIFICATION  Patient Name: [name] Birthdate: 10/30/1932 Sex: female Admission Date (Current Location): 04/07/2020  [facility] and [place] Number:      Facility and Address:  [facility],  [address], [place]      Provider Number: [id number]  Attending Physician Name and Address:  Donne Hazel, MD  Relative Name and Phone Number:       Current Level of Care: Hospital Recommended Level of Care: [place] Prior Approval Number:    Date Approved/Denied:   PASRR Number:    Discharge Plan: SNF    Current Diagnoses: Patient Active Problem List   Diagnosis Date Noted  . Non-ST elevation MI (NSTEMI) ([place]) 04/07/2020  . Nausea & vomiting 04/07/2020  . Hyponatremia 04/07/2020  . Alcohol abuse 04/07/2020  . Sialadenitis 03/26/2020  . Arm wound, left, initial encounter 01/15/2020  . Open knee wound, right, initial encounter 01/15/2020  . Diarrhea 01/01/2020  . Blood in stool 01/01/2020  . Left lower quadrant abdominal pain 01/01/2020  . Dark stools 11/05/2019  . GERD (gastroesophageal reflux disease) 11/05/2019  . Hypomagnesemia 10/14/2019  . Skin tear of left upper arm without complication 44/08/4740  . COPD with acute exacerbation ([place]) 05/22/2019  . Urinary incontinence due to immobility 05/22/2019  . Non-healing wound of lower extremity 05/03/2019  . Multiple skin tears 03/02/2019  . Weight loss 11/20/2018  . Depression 11/20/2018  . Recurrent falls 11/20/2018  . Parotiditis 09/17/2018  . Primary osteoarthritis of right hip 06/21/2018  . Degenerative disc disease, cervical 12/06/2017  . DOE (dyspnea on exertion) 11/21/2017  . Bilateral swelling of feet 11/02/2017  . Osteopenia 02/16/2017  . Hyperglycemia 12/08/2016  . Degenerative arthritis of right knee 10/27/2016  . 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 Greater  trochanteric bursitis of both hips 04/08/2016  . Pain in both knees 04/08/2016  . Chronic midline low back pain without sciatica 04/08/2016  . Chronic respiratory failure with hypoxia ([place]) 03/07/2015  . Diverticulitis 05/24/2014  . Anxiety   . Bladder prolapse, female, acquired   . Non-small cell carcinoma of lung, stage 1 ([place]) 01/10/2012  . Neuropathy, peripheral 01/28/2011  . COPD mixed type ([place]) 09/08/2010  . Dyslipidemia   . ATONY OF BLADDER 08/10/2009  . Hypothyroidism 03/12/2008  . Seasonal allergic rhinitis 10/10/2007    Orientation RESPIRATION BLADDER Height & Weight     Time, Situation, Place, Self  O2 (see dc summary) Incontinent Weight: 158 lb 11.7 oz (72 kg) Height:  5\' 6"  (167.6 cm)  BEHAVIORAL SYMPTOMS/MOOD NEUROLOGICAL BOWEL NUTRITION STATUS      Continent Diet (see dc summary)  AMBULATORY STATUS COMMUNICATION OF NEEDS Skin   Extensive Assist Verbally Normal                       Personal Care Assistance Level of Assistance  Bathing, Feeding, Dressing Bathing Assistance: Limited assistance Feeding assistance: Independent Dressing Assistance: Limited assistance     Functional Limitations Info  Sight, Hearing, Speech Sight Info: Adequate Hearing Info: Adequate Speech Info: Adequate    SPECIAL CARE FACTORS FREQUENCY  PT (By licensed PT), OT (By licensed OT)     PT Frequency: 5x week OT Frequency: 3x week            Contractures Contractures Info: Not present    Additional Factors Info  Code Status, Allergies, Psychotropic Code  Status Info: DNR Allergies Info: Ampicilin, Azithromycin,Codeine, Daliresp, Nitrofurantoin, nuts and seeds, Penicillins, Sulfanomide derivatives, Tiotropium Bromide Monohydrate, Cephalosporins, Doxycycline, Fludrocortisone Acetate, Lactose, Latex Psychotropic Info: Xanax         Current Medications (04/12/2020):  This is the current hospital active medication list Current Facility-Administered Medications   Medication Dose Route Frequency Provider Last Rate Last Admin  . ALPRAZolam Duanne Moron) tablet 1 mg  1 mg Oral QHS Donne Hazel, MD   1 mg at 04/11/20 2127  . aspirin chewable tablet 81 mg  81 mg Oral Daily Freada Bergeron, MD   81 mg at 04/12/20 0929  . azithromycin (ZITHROMAX) tablet 500 mg  500 mg Oral Daily Leodis Sias T, RPH   500 mg at 04/12/20 0930  . cefTRIAXone (ROCEPHIN) 1 g in sodium chloride 0.9 % 100 mL IVPB  1 g Intravenous Q24H Rise Patience, MD 200 mL/hr at 04/12/20 0539 1 g at 04/12/20 0539  . Chlorhexidine Gluconate Cloth 2 % PADS 6 each  6 each Topical Daily Oswald Hillock, MD   6 each at 04/11/20 1100  . enoxaparin (LOVENOX) injection 40 mg  40 mg Subcutaneous Q24H Donne Hazel, MD   40 mg at 04/11/20 2127  . fluticasone (FLOVENT HFA) 110 MCG/ACT inhaler 2 puff  2 puff Inhalation BID Hollace Hayward K, NP   2 puff at 04/12/20 0807  . folic acid (FOLVITE) tablet 1 mg  1 mg Oral Daily Rise Patience, MD   1 mg at 04/12/20 0929  . furosemide (LASIX) injection 40 mg  40 mg Intravenous BID Donne Hazel, MD   40 mg at 04/12/20 4481  . guaiFENesin (MUCINEX) 12 hr tablet 600 mg  600 mg Oral BID PRN Donne Hazel, MD   600 mg at 04/12/20 0930  . levalbuterol (XOPENEX) nebulizer solution 0.63 mg  0.63 mg Nebulization Q4H PRN Oswald Hillock, MD   0.63 mg at 04/09/20 1852  . levalbuterol (XOPENEX) nebulizer solution 1.25 mg  1.25 mg Nebulization TID Oswald Hillock, MD   1.25 mg at 04/12/20 0807  . levothyroxine (SYNTHROID) tablet 100 mcg  100 mcg Oral Q0600 Oswald Hillock, MD   100 mcg at 04/12/20 0539  . losartan (COZAAR) tablet 25 mg  25 mg Oral Daily Freada Bergeron, MD   25 mg at 04/12/20 0930  . MEDLINE mouth rinse  15 mL Mouth Rinse BID Oswald Hillock, MD   15 mL at 04/11/20 2127  . methylPREDNISolone sodium succinate (SOLU-MEDROL) 40 mg/mL injection 40 mg  40 mg Intravenous Q12H Donne Hazel, MD   40 mg at 04/12/20 0539  . morphine 2 MG/ML injection 1  mg  1 mg Intravenous Q4H PRN Rise Patience, MD   1 mg at 04/09/20 0237  . multivitamin with minerals tablet 1 tablet  1 tablet Oral Daily Rise Patience, MD   1 tablet at 04/12/20 0930  . ondansetron (ZOFRAN) injection 4 mg  4 mg Intravenous Q8H PRN Hollace Hayward K, NP   4 mg at 04/08/20 1524  . rosuvastatin (CRESTOR) tablet 20 mg  20 mg Oral Daily Freada Bergeron, MD   20 mg at 04/12/20 0930  . thiamine tablet 100 mg  100 mg Oral Daily Rise Patience, MD   100 mg at 04/12/20 8563     Discharge Medications: Please see discharge summary for a list of discharge medications.  Relevant Imaging Results:  Relevant Lab Results:  Additional Information SSN: [place], LCSW

## 2020-04-12 NOTE — Progress Notes (Signed)
PROGRESS NOTE    [name]  TIW:[id number] DOB: 01-29-1933 DOA: 04/07/2020 PCP: Binnie Rail, MD    Brief Narrative:  86 year old female with history of COPD on 3 L of oxygen at home, alcohol abuse, hypothyroidism came to ED with nausea and vomiting.  Patient had generalized tonic-clonic seizure in the ED.  She was found to have significant lab abnormalities including hypomagnesemia, magnesium 1.3, hypokalemia potassium 2.7, hyponatremia sodium 119.  BNP was 404, troponin elevated to 3200.  Covid test was negative.  Chest x-ray showed possible infiltrate.   Assessment & Plan:   Principal Problem:   Non-ST elevation MI (NSTEMI) ([place]) Active Problems:   Hypothyroidism   COPD mixed type (HCC)   Non-small cell carcinoma of lung, stage 1 (HCC)   Chronic respiratory failure with hypoxia (HCC)   Nausea & vomiting   Hyponatremia   Alcohol abuse  NSTEMI -Cardiology was consulted -Per Cardiology, continue ASA 81mg , Crestor 20mg .  -Losartan added by Cardiology today -Pt was given heparin x 48hrs, now off -Per Cardiology, pending clinical improvement, can proceed with stress testing in the future as an outpatient. Cardiology has since signed off  Seizure -likely from alcohol withdrawal versus metabolic abnormalities.   -Presenting sodium is 119 -EEG reviewed, unremarkable -Dr. Darrick Meigs discussed with neurology who did not recommend seizure meds -Remains seizure free at this time  Alcohol withdrawal-patient drinks alcohol daily -Discussed with family in room. Last ETOH is believed to be on 10/17 -Suspect ETOH is related to below pna, suspect aspiration PNA -CIWA now 0. Now off CIWA -Family reports multiple bottles of brandy and other alcoholic beverages were found in the home, currently being discarded by family while pt is in hospital -Pt historically has been hiding alcohol around the house, such as the closet, without family knowledge -Will continue to encourage  cessation  Pneumonia -Presenting CT chest obtained is negative for PE, however is pos for right lower lobe pneumonia, given presenting nausea/vomiting in the setting of ETOH abuse, consider aspiration PNA -Currently tolerating rocephin and azithromycin -Seems to be improving  Hyponatremia -Presenting sodium is 119 -Sodium up to 121 today -Given EtOH hx, consider component of beer potomania -recheck bmet in AM  COPD exacerbation -continue neb tx as neeed -decreased breath sounds on exam, however air movement appears improving  Hypothyroidism -Presenting TSH is elevated at 10.57, patient is on Synthroid 88 mcg at home.  -Synthroid was recently increased to 100 mcg daily.   -Free T4 was within normal limits -Possible element of sick euthyroid  Intractable nausea and vomiting --CT abdomen/pelvis is unremarkable.  Lipase 23.  Continue Zofran as needed for nausea vomiting. -suspect related to on-going ETOH abuse -Appreciate assistance by SLP. MBS study obtained, pending results  Acute Systolic CHF, new finding thus chronicity is unknown -2d echo with EF 45-50% -Recent BNP elevated at 404 -Wt up to 75kg 10/23 from 63kg 10/19 -Will continue pt on 40mg  IV BID lasix -follow I/o and daily wts  DVT prophylaxis: Lovenox subq Code Status: DNR Family Communication: Pt in room, pt's family currently not at bedside  Status is: Inpatient  Remains inpatient appropriate because:Hemodynamically unstable, Altered mental status, Unsafe d/c plan, IV treatments appropriate due to intensity of illness or inability to take PO and Inpatient level of care appropriate due to severity of illness   Dispo: The patient is from: Home              Anticipated d/c is to: SNF  Anticipated d/c date is: > 3 days              Patient currently is not medically stable to d/c.  Consultants:   Cardiology  Procedures:     Antimicrobials: Anti-infectives (From admission, onward)   Start      Dose/Rate Route Frequency Ordered Stop   04/11/20 1000  azithromycin (ZITHROMAX) tablet 500 mg        500 mg Oral Daily 04/10/20 1420     04/08/20 1500  azithromycin (ZITHROMAX) 250 mg in dextrose 5 % 125 mL IVPB  Status:  Discontinued        250 mg 125 mL/hr over 60 Minutes Intravenous Every 24 hours 04/08/20 1402 04/08/20 1406   04/08/20 1500  azithromycin (ZITHROMAX) 500 mg in sodium chloride 0.9 % 250 mL IVPB  Status:  Discontinued        500 mg 250 mL/hr over 60 Minutes Intravenous Every 24 hours 04/08/20 1407 04/10/20 1420   04/07/20 0600  cefTRIAXone (ROCEPHIN) 1 g in sodium chloride 0.9 % 100 mL IVPB        1 g 200 mL/hr over 30 Minutes Intravenous Every 24 hours 04/07/20 0553        Subjective: Reports feeling better today  Objective: Vitals:   04/12/20 0541 04/12/20 0808 04/12/20 1346 04/12/20 1354  BP: 129/74  (!) 117/58   Pulse: 87  92   Resp: 20  14   Temp: 98 F (36.7 C)   98.6 F (37 C)  TempSrc: Oral   Oral  SpO2: 99% 98% 98%   Weight:      Height:        Intake/Output Summary (Last 24 hours) at 04/12/2020 1529 Last data filed at 04/12/2020 1334 Gross per 24 hour  Intake 480 ml  Output 2850 ml  Net -2370 ml   Filed Weights   04/09/20 0600 04/11/20 0558 04/11/20 1852  Weight: 63.7 kg 75.3 kg 72 kg    Examination: General exam: Awake, laying in bed, in nad Respiratory system: Normal respiratory effort, no audible wheezing Cardiovascular system: regular rate, s1, s2 Gastrointestinal system: Soft, nondistended, positive BS Central nervous system: CN2-12 grossly intact, strength intact Extremities: Perfused, no clubbing Skin: Normal skin turgor, no notable skin lesions seen Psychiatry: Mood normal // no visual hallucinations   Data Reviewed: I have personally reviewed following labs and imaging studies  CBC: Recent Labs  Lab 04/08/20 0529 04/09/20 0252 04/10/20 0248 04/11/20 0246 04/12/20 0537  WBC 10.7* 9.2 8.6 7.7 5.7  HGB 9.4* 9.0*  8.6* 9.1* 8.6*  HCT 27.7* 26.7* 26.6* 27.2* 26.8*  MCV 93.9 95.0 98.5 96.8 100.4*  PLT 146* 136* 125* 141* 412   Basic Metabolic Panel: Recent Labs  Lab 04/07/20 0527 04/07/20 1047 04/07/20 1334 04/07/20 1334 04/08/20 0529 04/09/20 0252 04/10/20 0248 04/11/20 0246 04/12/20 0537  NA 119*   < > 119*   < > 120* 121* 121* 123* 131*  K 3.0*   < > 3.8   < > 3.9 3.9 4.3 4.3 3.7  CL 83*   < > 89*   < > 86* 87* 92* 94* 94*  CO2 22   < > 24   < > 23 23 24 23 31   GLUCOSE 116*   < > 103*   < > 118* 128* 121* 116* 123*  BUN <5*   < > <5*   < > <5* 6* 7* 7* 6*  CREATININE 0.65   < > 0.60   < >  0.72 0.79 0.72 0.61 0.68  CALCIUM 7.7*   < > 7.5*   < > 8.1* 8.0* 8.0* 8.1* 8.4*  MG 1.3*  --  2.0  --   --  1.9  --  1.9  --    < > = values in this interval not displayed.   GFR: Estimated Creatinine Clearance: 53.2 mL/min (by C-G formula based on SCr of 0.68 mg/dL). Liver Function Tests: Recent Labs  Lab 04/07/20 0527 04/09/20 0252 04/10/20 0248 04/11/20 0246 04/12/20 0537  AST 98* 98* 76* 70* 56*  ALT 34 37 34 35 33  ALKPHOS 138* 126 112 108 99  BILITOT 1.0 0.9 0.6 0.4 0.4  PROT 5.8* 6.2* 5.6* 5.6* 5.1*  ALBUMIN 2.5* 2.7* 2.4* 2.4* 2.2*   Recent Labs  Lab 04/07/20 0527 04/09/20 0252  LIPASE 23 25   No results for input(s): AMMONIA in the last 168 hours. Coagulation Profile: Recent Labs  Lab 04/07/20 0527  INR 1.2   Cardiac Enzymes: No results for input(s): CKTOTAL, CKMB, CKMBINDEX, TROPONINI in the last 168 hours. BNP (last 3 results) No results for input(s): PROBNP in the last 8760 hours. HbA1C: No results for input(s): HGBA1C in the last 72 hours. CBG: Recent Labs  Lab 04/07/20 0308  GLUCAP 165*   Lipid Profile: No results for input(s): CHOL, HDL, LDLCALC, TRIG, CHOLHDL, LDLDIRECT in the last 72 hours. Thyroid Function Tests: Recent Labs    04/10/20 0248  FREET4 0.69   Anemia Panel: No results for input(s): VITAMINB12, FOLATE, FERRITIN, TIBC, IRON,  RETICCTPCT in the last 72 hours. Sepsis Labs: Recent Labs  Lab 04/07/20 0657 04/07/20 1047  LATICACIDVEN 3.6* 3.0*    Recent Results (from the past 240 hour(s))  Respiratory Panel by RT PCR (Flu A&B, Covid) - Nasopharyngeal Swab     Status: None   Collection Time: 04/07/20  4:30 AM   Specimen: Nasopharyngeal Swab  Result Value Ref Range Status   SARS Coronavirus 2 by RT PCR NEGATIVE NEGATIVE Final    Comment: (NOTE) SARS-CoV-2 target nucleic acids are NOT DETECTED.  The SARS-CoV-2 RNA is generally detectable in upper respiratoy specimens during the acute phase of infection. The lowest concentration of SARS-CoV-2 viral copies this assay can detect is 131 copies/mL. A negative result does not preclude SARS-Cov-2 infection and should not be used as the sole basis for treatment or other patient management decisions. A negative result may occur with  improper specimen collection/handling, submission of specimen other than nasopharyngeal swab, presence of viral mutation(s) within the areas targeted by this assay, and inadequate number of viral copies (<131 copies/mL). A negative result must be combined with clinical observations, patient history, and epidemiological information. The expected result is Negative.  Fact Sheet for Patients:  PinkCheek.be  Fact Sheet for Healthcare Providers:  GravelBags.it  This test is no t yet approved or cleared by the [place] FDA and  has been authorized for detection and/or diagnosis of SARS-CoV-2 by FDA under an Emergency Use Authorization (EUA). This EUA will remain  in effect (meaning this test can be used) for the duration of the COVID-19 declaration under Section 564(b)(1) of the Act, 21 U.S.C. section 360bbb-3(b)(1), unless the authorization is terminated or revoked sooner.     Influenza A by PCR NEGATIVE NEGATIVE Final   Influenza B by PCR NEGATIVE NEGATIVE Final     Comment: (NOTE) The Xpert Xpress SARS-CoV-2/FLU/RSV assay is intended as an aid in  the diagnosis of influenza from Nasopharyngeal swab specimens and  should not be used as a sole basis for treatment. Nasal washings and  aspirates are unacceptable for Xpert Xpress SARS-CoV-2/FLU/RSV  testing.  Fact Sheet for Patients: PinkCheek.be  Fact Sheet for Healthcare Providers: GravelBags.it  This test is not yet approved or cleared by the [place] FDA and  has been authorized for detection and/or diagnosis of SARS-CoV-2 by  FDA under an Emergency Use Authorization (EUA). This EUA will remain  in effect (meaning this test can be used) for the duration of the  Covid-19 declaration under Section 564(b)(1) of the Act, 21  U.S.C. section 360bbb-3(b)(1), unless the authorization is  terminated or revoked. Performed at [facility], [place] [address], [place], [place] [address]   MRSA PCR Screening     Status: None   Collection Time: 04/07/20  5:29 PM   Specimen: Nasal Mucosa; Nasopharyngeal  Result Value Ref Range Status   MRSA by PCR NEGATIVE NEGATIVE Final    Comment:        The GeneXpert MRSA Assay (FDA approved for NASAL specimens only), is one component of a comprehensive MRSA colonization surveillance program. It is not intended to diagnose MRSA infection nor to guide or monitor treatment for MRSA infections. Performed at [facility], [place] [address] [place], [place] [address]      Radiology Studies: DG Swallowing Func-Speech Pathology  Result Date: 04/11/2020 Objective Swallowing Evaluation: Type of Study: MBS-Modified Barium Swallow Study  Patient Details Name: SPARKLE AUBE MRN: [id number] Date of Birth: Sep 05, 1932 Today's Date: 04/11/2020 Time: SLP Start Time (ACUTE ONLY): 1450 -SLP Stop Time (ACUTE ONLY): 1510 SLP Time Calculation (min) (ACUTE ONLY): 20 min Past Medical  History: Past Medical History: Diagnosis Date . Allergic rhinitis  . Anxiety  . Arthritis   "back, hands; hips" (12/22/2014) . Asthma  . Bladder atony  . Chronic bronchitis ([place])  . COPD (chronic obstructive pulmonary disease) ([place])  . Diarrhea  . Emphysema of lung ([place])  . GERD (gastroesophageal reflux disease)   "w/spicey foods" (12/22/2014) . Hyperlipidemia  . Non-small cell carcinoma of lung, stage 1 ([place]) 2005  1.4 cm Poorly differentiated Squamous cell RUL  T1N0 resected 09/22/2003 . On home oxygen therapy   "3L q night and prn during the day" (12/22/2014) . Pelvic cyst  . Pneumonia   "this is the 3rd time that I can remember" (12/22/2014) . Unspecified hypothyroidism  Past Surgical History: Past Surgical History: Procedure Laterality Date . ABDOMINAL HYSTERECTOMY  ~ 1976 . BACK SURGERY   . CATARACT EXTRACTION W/ INTRAOCULAR LENS  IMPLANT, BILATERAL Bilateral ~ 2012 . DILATION AND CURETTAGE OF UTERUS   . LUMBAR DISC SURGERY  1970's . LYMPH NODE DISSECTION  2005  "all my lymph nodes under breasts, went thru my back; Dr. Arlyce Dice did it" . Needle aspiration Pelvic cyst  2011  Dr Diona Fanti . VIDEO ASSISTED THORACOSCOPY (VATS)/THOROCOTOMY Right 09/2003  thoracotomy, right upper lobectomy with node dissection; Dr Demetrios Loll 11/02/2010 . VIDEO BRONCHOSCOPY  03/2004  Archie Endo 11/02/2010 HPI: 86 yo female adm [facility] with AMS, COPD exacerbation and n/v, hospital coarse complicated by seizure.  Pt CT abdomen showed right LL pna.  CT head s howed againg brain without acute changes.  Pt is on 3 liters of oxygen at home at baseline.   Pt with H/O ETOH use, lung cancer s/p partial lobectomy, COPD.   She is currently on heparain for NSTEMI.  Pt admits to h/o reflux and frequent belching for which she took a PPI for  a period of time. Pt is essentially chair bound at home within the last few months due to frequent falls in the last few months.  Subjective: pleasant, alert, daughter present prior to and after MBS for education Assessment /  Plan / Recommendation CHL IP CLINICAL IMPRESSIONS 04/11/2020 Clinical Impression Patient presents with a mild oral and a mild-mod pharyngeal dysphagia without aspiration of any tested consistencies (thin, puree, regular solids, barium tablet). She exhibited flash penetration during the swallow with thin liquids via straw sips, mixed consistencies (sips of liquids during regular solids intake) but full clearance of penetrate observed each time. Patient with swallow initiation delay at level of vallecular sinus with thin liquids and exhbiited trace to minimal vallecular sinus residuals with puree solids, regular solids and trace residuals with thin liquids which fully cleared after subsequent, uncued swallows. Presence of two cervical osteophytes (no radiologist to confirm) impeding into pharynx but not significantly impacting pharyngeal phase of swallow. Aspiration risk related to dysphagia is in mild range however her COPD/respiratory issues do put her at a higher risk and patient is advised to monitor respiratory rate and oxygen saturations and take rest breaks when eating. SLP Visit Diagnosis Dysphagia, oropharyngeal phase (R13.12) Attention and concentration deficit following -- Frontal lobe and executive function deficit following -- Impact on safety and function Mild aspiration risk   CHL IP TREATMENT RECOMMENDATION 04/11/2020 Treatment Recommendations Therapy as outlined in treatment plan below   Prognosis 04/11/2020 Prognosis for Safe Diet Advancement Good Barriers to Reach Goals -- Barriers/Prognosis Comment -- CHL IP DIET RECOMMENDATION 04/11/2020 SLP Diet Recommendations Dysphagia 3 (Mech soft) solids;Thin liquid Liquid Administration via Cup;Straw Medication Administration Whole meds with liquid Compensations Minimize environmental distractions;Small sips/bites;Slow rate;Other (Comment) Postural Changes --   CHL IP OTHER RECOMMENDATIONS 04/11/2020 Recommended Consults -- Oral Care Recommendations Oral  care BID Other Recommendations --   CHL IP FOLLOW UP RECOMMENDATIONS 04/11/2020 Follow up Recommendations None   CHL IP FREQUENCY AND DURATION 04/11/2020 Speech Therapy Frequency (ACUTE ONLY) min 1 x/week Treatment Duration 1 week      CHL IP ORAL PHASE 04/11/2020 Oral Phase Impaired Oral - Pudding Teaspoon -- Oral - Pudding Cup -- Oral - Honey Teaspoon -- Oral - Honey Cup -- Oral - Nectar Teaspoon -- Oral - Nectar Cup -- Oral - Nectar Straw -- Oral - Thin Teaspoon -- Oral - Thin Cup -- Oral - Thin Straw -- Oral - Puree -- Oral - Mech Soft -- Oral - Regular Reduced posterior propulsion Oral - Multi-Consistency -- Oral - Pill -- Oral Phase - Comment --  CHL IP PHARYNGEAL PHASE 04/11/2020 Pharyngeal Phase Impaired Pharyngeal- Pudding Teaspoon -- Pharyngeal -- Pharyngeal- Pudding Cup -- Pharyngeal -- Pharyngeal- Honey Teaspoon -- Pharyngeal -- Pharyngeal- Honey Cup -- Pharyngeal -- Pharyngeal- Nectar Teaspoon -- Pharyngeal -- Pharyngeal- Nectar Cup -- Pharyngeal -- Pharyngeal- Nectar Straw -- Pharyngeal -- Pharyngeal- Thin Teaspoon -- Pharyngeal -- Pharyngeal- Thin Cup Delayed swallow initiation-vallecula;Penetration/Aspiration during swallow Pharyngeal Material enters airway, remains ABOVE vocal cords then ejected out Pharyngeal- Thin Straw Delayed swallow initiation-vallecula;Penetration/Aspiration during swallow Pharyngeal Material enters airway, remains ABOVE vocal cords then ejected out Pharyngeal- Puree WFL Pharyngeal -- Pharyngeal- Mechanical Soft -- Pharyngeal -- Pharyngeal- Regular WFL Pharyngeal -- Pharyngeal- Multi-consistency Penetration/Aspiration during swallow Pharyngeal Material enters airway, remains ABOVE vocal cords then ejected out Pharyngeal- Pill WFL Pharyngeal -- Pharyngeal Comment --  CHL IP CERVICAL ESOPHAGEAL PHASE 04/11/2020 Cervical Esophageal Phase (No Data) Pudding Teaspoon -- Pudding Cup -- Honey Teaspoon -- Honey Cup -- [employer]  Teaspoon -- Nectar Cup -- Nectar Straw -- Thin Teaspoon --  Thin Cup -- Thin Straw -- Puree -- Mechanical Soft -- Regular -- Multi-consistency -- Pill -- Cervical Esophageal Comment -- Sonia Baller, MA, CCC-SLP Speech Therapy              Scheduled Meds: . ALPRAZolam  1 mg Oral QHS  . aspirin  81 mg Oral Daily  . azithromycin  500 mg Oral Daily  . Chlorhexidine Gluconate Cloth  6 each Topical Daily  . enoxaparin (LOVENOX) injection  40 mg Subcutaneous Q24H  . fluticasone  2 puff Inhalation BID  . folic acid  1 mg Oral Daily  . furosemide  40 mg Intravenous BID  . levalbuterol  1.25 mg Nebulization TID  . levothyroxine  100 mcg Oral Q0600  . losartan  25 mg Oral Daily  . mouth rinse  15 mL Mouth Rinse BID  . methylPREDNISolone (SOLU-MEDROL) injection  40 mg Intravenous Q12H  . multivitamin with minerals  1 tablet Oral Daily  . rosuvastatin  20 mg Oral Daily  . thiamine  100 mg Oral Daily   Continuous Infusions: . cefTRIAXone (ROCEPHIN)  IV 1 g (04/12/20 0539)     LOS: 5 days   Marylu Lund, MD [facility] Pager On Amion  If 7PM-7AM, please contact night-coverage 04/12/2020, 3:29 PM

## 2020-04-12 NOTE — TOC Progression Note (Signed)
Transition of Care (TOC) -30 day Note       Patient Details  Name: [name] MRN: [id number] Date of Birth: 04-30-1933   Transition of Care [facility]) CM/SW Contact  Name: Shade Flood Phone Number: [phone] Date: 04/12/2020 Time:12:58   MUST ID: [id number]   To Whom it May Concern:   Please be advised that the above patient will require a short-term nursing home stay, anticipated 30 days or less rehabilitation and strengthening. The plan is for return home.

## 2020-04-12 NOTE — TOC Progression Note (Signed)
Transition of Care [facility]) - Progression Note    Patient Details  Name: KHLOIE HAMADA MRN: [id number] Date of Birth: 08/04/1932  [facility]) CM/SW Contact  Shade Flood, LCSW Phone Number: 04/12/2020, 12:43 PM  Clinical Narrative:     TOC following. PT recommending SNF rehab for pt. Met with pt and her sister at bedside this AM to discuss. Pt states she will consider going to SNF. CMS options discussed and will refer as requested.  Pt's sister states that pt's daughter, [name], will be here tomorrow and TOC can follow up with pt and [name] for bed offers.  TOC will follow.  Expected Discharge Plan: Skilled Nursing Facility Barriers to Discharge: Barriers Unresolved (comment)  Expected Discharge Plan and Services Expected Discharge Plan: [facility] [place]   Discharge Planning Services: CM Consult   Living arrangements for the past 2 months: Single Family Home                                       Social Determinants of Health (SDOH) Interventions    Readmission Risk Interventions No flowsheet data found.

## 2020-04-13 ENCOUNTER — Inpatient Hospital Stay: Payer: PPO

## 2020-04-13 DIAGNOSIS — Z23 Encounter for immunization: Secondary | ICD-10-CM

## 2020-04-13 LAB — CBC
HCT: 28.8 % — ABNORMAL LOW (ref 36.0–46.0)
Hemoglobin: 9.3 g/dL — ABNORMAL LOW (ref 12.0–15.0)
MCH: 32.1 pg (ref 26.0–34.0)
MCHC: 32.3 g/dL (ref 30.0–36.0)
MCV: 99.3 fL (ref 80.0–100.0)
Platelets: 165 10*3/uL (ref 150–400)
RBC: 2.9 MIL/uL — ABNORMAL LOW (ref 3.87–5.11)
RDW: 13.8 % (ref 11.5–15.5)
WBC: 7.7 10*3/uL (ref 4.0–10.5)
nRBC: 0 % (ref 0.0–0.2)

## 2020-04-13 LAB — COMPREHENSIVE METABOLIC PANEL
ALT: 39 U/L (ref 0–44)
AST: 55 U/L — ABNORMAL HIGH (ref 15–41)
Albumin: 2.3 g/dL — ABNORMAL LOW (ref 3.5–5.0)
Alkaline Phosphatase: 106 U/L (ref 38–126)
Anion gap: 7 (ref 5–15)
BUN: 8 mg/dL (ref 8–23)
CO2: 37 mmol/L — ABNORMAL HIGH (ref 22–32)
Calcium: 8.4 mg/dL — ABNORMAL LOW (ref 8.9–10.3)
Chloride: 89 mmol/L — ABNORMAL LOW (ref 98–111)
Creatinine, Ser: 0.83 mg/dL (ref 0.44–1.00)
GFR, Estimated: >60 mL/min (ref >60)
Glucose, Bld: 122 mg/dL — ABNORMAL HIGH (ref 70–99)
Potassium: 3.2 mmol/L — ABNORMAL LOW (ref 3.5–5.1)
Sodium: 133 mmol/L — ABNORMAL LOW (ref 135–145)
Total Bilirubin: 0.6 mg/dL (ref 0.3–1.2)
Total Protein: 5.3 g/dL — ABNORMAL LOW (ref 6.5–8.1)

## 2020-04-13 LAB — MAGNESIUM: Magnesium: 2.1 mg/dL (ref 1.7–2.4)

## 2020-04-13 MED ORDER — ADULT MULTIVITAMIN W/MINERALS CH
1.0000 | ORAL_TABLET | Freq: Every day | ORAL | Status: DC
  Administered 2020-04-14 – 2020-04-16 (×3): 1 via ORAL
  Filled 2020-04-13 (×4): qty 1

## 2020-04-13 MED ORDER — POTASSIUM CHLORIDE CRYS ER 20 MEQ PO TBCR
40.0000 meq | EXTENDED_RELEASE_TABLET | Freq: Two times a day (BID) | ORAL | Status: AC
  Administered 2020-04-13 (×2): 40 meq via ORAL
  Filled 2020-04-13 (×2): qty 2

## 2020-04-13 MED ORDER — THIAMINE HCL 100 MG PO TABS
100.0000 mg | ORAL_TABLET | Freq: Every day | ORAL | Status: DC
  Administered 2020-04-14 – 2020-04-16 (×3): 100 mg via ORAL
  Filled 2020-04-13 (×4): qty 1

## 2020-04-13 NOTE — Progress Notes (Signed)
Physical Therapy Treatment Patient Details Name: [name] MRN: [id number] DOB: Jun 24, 1932 Today's Date: 04/13/2020    History of Present Illness 86 y.o. female with history of severe COPD/emphysema on 3L [place], small cell lung cancer s/p right upper lobectomy in 2012, alcohol abuse with history of withdrawals who presented with nausea, vomiting and abdominal pain found to have NSTEMI with trop 3265 as well as significant hyponatremia. Now on medical management for NSTEMI.    PT Comments    Patient making gradual progress with Acute PT. She required reduced assist to complete supine>sit transfer and was able to scoot forward to EOB with cues for use of UE and min-mod assist. Pt complete sit<>stand with RW today and mod assist required to initiate power up and steady in standing. Pt required cues for stepping and assist to manage walker but was able to complete stand step transfer to move to recliner. She c/o dizziness after transfer, VSS and RN present to provide medications. Educated pt on LE strengthening in seated position, pt has greater difficulty with Rt hip flexion compared to Lt. She will continue to benefit from skilled PT interventions and continue to recommend SNF level rehab for follow up. Acute will progress as able.   Follow Up Recommendations  SNF     Equipment Recommendations  None recommended by PT    Recommendations for Other Services       Precautions / Restrictions Precautions Precautions: Fall Restrictions Weight Bearing Restrictions: No    Mobility  Bed Mobility Overal bed mobility: Needs Assistance Bed Mobility: Supine to Sit     Supine to sit: Mod assist;HOB elevated     General bed mobility comments: Cues for use of bed rail and assist for raising trunk and bringing bil LE's off EOB. pt able to scoot to EOB using UE's and mod assist at bed pad.  Transfers Overall transfer level: Needs assistance Equipment used: Rolling walker (2  wheeled) Transfers: Sit to/from [facility] Sit to Stand: Mod assist;From elevated surface Stand pivot transfers: Mod assist;+2 safety/equipment;From elevated surface       General transfer comment: Sit<>stand from EOB (elevated) with cues for safe hand placement on RW and Mod assist for power up. Assist at hips to facilitate upright posture in standing. Mod assist for walker management and to steady while taking small steps to move to recliner.   Ambulation/Gait Ambulation/Gait assistance: Mod assist;+2 safety/equipment Gait Distance (Feet): 4 Feet Assistive device: Rolling walker (2 wheeled) Gait Pattern/deviations: Step-to pattern;Decreased stride length;Decreased step length - right;Decreased step length - left;Trunk flexed     General Gait Details: pt took small steps to Rt to move to recliner, mod assist to steady and manage walker position throughout. pt c/o slight dizziness after sitting, VSS.   Stairs             Wheelchair Mobility    Modified Rankin (Stroke Patients Only)       Balance Overall balance assessment: History of Falls;Needs assistance Sitting-balance support: No upper extremity supported;Feet supported Sitting balance-Leahy Scale: Fair     Standing balance support: Bilateral upper extremity supported;During functional activity Standing balance-Leahy Scale: Poor Standing balance comment: reliant on external support                            Cognition Arousal/Alertness: Awake/alert Behavior During Therapy: WFL for tasks assessed/performed Overall Cognitive Status: Within Functional Limits for tasks assessed  Exercises General Exercises - Lower Extremity Ankle Circles/Pumps: AROM;Both;10 reps;Seated Long Arc Quad: AROM;Both;10 reps;Seated Hip Flexion/Marching: AROM;Both;10 reps;Seated    General Comments        Pertinent Vitals/Pain Pain Assessment:  No/denies pain    Home Living                      Prior Function            PT Goals (current goals can now be found in the care plan section) Acute Rehab PT Goals Patient Stated Goal: To get strength back PT Goal Formulation: With patient Time For Goal Achievement: 04/24/20 Potential to Achieve Goals: Fair Progress towards PT goals: Progressing toward goals    Frequency    Min 3X/week      PT Plan Current plan remains appropriate    Co-evaluation              AM-PAC PT "6 Clicks" Mobility   Outcome Measure  Help needed turning from your back to your side while in a flat bed without using bedrails?: A Little Help needed moving from lying on your back to sitting on the side of a flat bed without using bedrails?: A Lot Help needed moving to and from a bed to a chair (including a wheelchair)?: A Lot Help needed standing up from a chair using your arms (e.g., wheelchair or bedside chair)?: A Lot Help needed to walk in hospital room?: A Lot Help needed climbing 3-5 steps with a railing? : Total 6 Click Score: 12    End of Session Equipment Utilized During Treatment: Gait belt;Oxygen Activity Tolerance: Patient tolerated treatment well Patient left: in chair;with call bell/phone within reach;with chair alarm set;with family/visitor present;with nursing/sitter in room Nurse Communication: Mobility status PT Visit Diagnosis: Unsteadiness on feet (R26.81);Muscle weakness (generalized) (M62.81);Difficulty in walking, not elsewhere classified (R26.2)     Time: 4403-4742 PT Time Calculation (min) (ACUTE ONLY): 31 min  Charges:  $Therapeutic Exercise: 8-22 mins $Therapeutic Activity: 8-22 mins                     Verner Mould, DPT Acute Rehabilitation Services  Office [phone] Pager [phone]  04/13/2020 1:03 PM

## 2020-04-13 NOTE — TOC Progression Note (Addendum)
Transition of Care [facility]) - Progression Note    Patient Details  Name: [name] MRN: [id number] Date of Birth: 06-13-1933  Transition of [facility]) CM/SW Contact  Gage Weant, Juliann Pulse, RN Phone Number: 04/13/2020, 12:34 PM  Clinical Narrative: Damaris Schooner to patient's dtr [name] informed of bed offers-will also talk to patient in rm & provided bed offers-informed of recc SNF-will need auth,noted for covid vaccine. Await pasrr-need doucmented mental/& behavior status.    2:35p-dtr Cindy-chose [facility]-rep Dedra following. Will start auth within 24hrs of medical stability.  1. 1.7 mi [place] [facility] [place], [place] [address] [phone] Overall rating Below average 2. 2 mi [facility] [employer] [place], [place] [address] [phone] Overall rating Below average 3. 2.1 mi [place] [facility] [place], [place] [address] [phone] Overall rating Much below average 4. 2.2 mi [place] [facility] [employer] [place], [place] [address] [phone] Overall rating Much above average 5. 2.2 mi [place], [place] [address] [phone] Overall rating Below average 6. 2.7 [place], [place] [address] [phone] Overall rating Much above average 7. 3.2 mi [place] [address] [place], [place] [address] [phone] Overall rating Below average 8. 3.3 [place] [address] [place], [place] [address] [phone] Overall rating Below average 9. 3.5 mi [facility] [place], [place] [address] [phone] Overall rating Average 10. 4.3 [place], [place] [address] [phone] Overall rating Below average 11. 4.7 mi [facility] [place], [place] [address] [phone] Overall rating Much above average 12. 5.2 mi [facility] [address] [place], [place] [address] [phone] Overall rating Much above average 13. 6.2 mi [place], [place] [address] [phone] Overall rating Average 14. 8.1 [place], [place] [address] [phone] Overall rating Above average 15. 8.1 mi [facility] [place],  [address] [phone] Overall rating Much below average   Expected Discharge Plan: [place] Barriers to Discharge: Barriers Unresolved (comment)  Expected Discharge Plan and Services Expected Discharge Plan: [place]   Discharge Planning Services: CM Consult   Living arrangements for the past 2 months: Single Family Home                                       Social Determinants of Health (SDOH) Interventions    Readmission Risk Interventions No flowsheet data found.

## 2020-04-13 NOTE — Progress Notes (Signed)
   Covid-19 Vaccination Clinic  Name:  RADIAH LUBINSKI    MRN: [id number] DOB: 1932/07/20  04/13/2020  Ms. Pizzini was observed post Covid-19 immunization for 15 minutes without incident. She was provided with Vaccine Information Sheet and instruction to access the V-Safe system.   Ms. Olmsted was instructed to call 911 with any severe reactions post vaccine: [name] Difficulty breathing  . Swelling of face and throat  . A fast heartbeat  . A bad rash all over body  . Dizziness and weakness   Immunizations Administered    Name Date Dose VIS Date Route   JANSSEN COVID-19 VACCINE 04/13/2020  2:15 PM 0.5 mL 08/17/2019 Intramuscular   Manufacturer: Alphonsa Overall   Lot: [id number]   [place]: [phone]

## 2020-04-13 NOTE — Care Management Important Message (Signed)
Important Message  Patient Details IM Letter given to the Patient Name: [name] MRN: [id number] Date of Birth: 1933-04-03   Medicare Important Message Given:  Yes     Kerin Salen 04/13/2020, 1:09 PM

## 2020-04-13 NOTE — Plan of Care (Signed)
  Problem: Clinical Measurements: Goal: Will remain free from infection Outcome: Progressing Goal: Respiratory complications will improve Outcome: Progressing Goal: Cardiovascular complication will be avoided Outcome: Progressing   

## 2020-04-13 NOTE — Progress Notes (Signed)
PROGRESS NOTE    [name]  DTO:[id number] DOB: 07/22/1932 DOA: 04/07/2020 PCP: Binnie Rail, MD    Brief Narrative:  86 year old female with history of COPD on 3 L of oxygen at home, alcohol abuse, hypothyroidism came to ED with nausea and vomiting.  Patient had generalized tonic-clonic seizure in the ED.  She was found to have significant lab abnormalities including hypomagnesemia, magnesium 1.3, hypokalemia potassium 2.7, hyponatremia sodium 119.  BNP was 404, troponin elevated to 3200.  Covid test was negative.  Chest x-ray showed possible infiltrate.   Assessment & Plan:   Principal Problem:   Non-ST elevation MI (NSTEMI) ([place]) Active Problems:   Hypothyroidism   COPD mixed type (HCC)   Non-small cell carcinoma of lung, stage 1 (HCC)   Chronic respiratory failure with hypoxia (HCC)   Nausea & vomiting   Hyponatremia   Alcohol abuse  NSTEMI -Cardiology was consulted -Per Cardiology, continue ASA 81mg , Crestor 20mg .  -Losartan added by Cardiology today -Pt was given heparin x 48hrs, now off -Per Cardiology, pending clinical improvement, can proceed with stress testing in the future as an outpatient. Cardiology has since signed off -Remains chest pain free  Seizure -likely from alcohol withdrawal versus metabolic abnormalities.   -Presenting sodium is 119 -EEG reviewed, unremarkable -Dr. Darrick Meigs discussed with neurology who did not recommend seizure meds -Remains seizure free currently  Alcohol withdrawal-patient drinks alcohol daily -Discussed with family in room. Last ETOH is believed to be on 10/17 -Suspect ETOH is related to below pna, suspect aspiration PNA -CIWA now 0. Now off CIWA -Family reports multiple bottles of brandy and other alcoholic beverages were found in the home, currently being discarded by family while pt is in hospital -Pt historically has been hiding alcohol around the house, such as the closet, without family knowledge -Recommend ETOH  cessation  Pneumonia -Presenting CT chest obtained is negative for PE, however is pos for right lower lobe pneumonia, given presenting nausea/vomiting in the setting of ETOH abuse, consider aspiration PNA -Completed course of rocephin and azithromycin -Seems to be improving  Hyponatremia -Presenting sodium is 119 -Sodium up to 133 today -Given EtOH hx, consider component of beer potomania vs acute CHF -recheck bmet in AM  COPD exacerbation -continue neb tx as neeed -decreased breath sounds on exam, however air movement appears improving  Hypothyroidism -Presenting TSH is elevated at 10.57, patient is on Synthroid 88 mcg at home.  -Synthroid was recently increased to 100 mcg daily.   -Free T4 was within normal limits -Possible element of sick euthyroid  Intractable nausea and vomiting --CT abdomen/pelvis is unremarkable.  Lipase 23.  Continue Zofran as needed for nausea vomiting. -suspect related to on-going ETOH abuse -Appreciate assistance by SLP. MBS study obtained, pending results  Acute Systolic CHF, new finding thus chronicity is unknown -2d echo with EF 45-50% -Recent BNP elevated at 404 -Wt up to 75kg 10/23 from 63kg 10/19 -Will continue pt on 40mg  IV BID lasix. Diuresing well -follow I/o and daily wts  DVT prophylaxis: Lovenox subq Code Status: DNR Family Communication: Pt in room, pt's family currently not at bedside  Status is: Inpatient  Remains inpatient appropriate because:Hemodynamically unstable, Altered mental status, Unsafe d/c plan, IV treatments appropriate due to intensity of illness or inability to take PO and Inpatient level of care appropriate due to severity of illness   Dispo: The patient is from: Home              Anticipated d/c is to:  SNF              Anticipated d/c date is: > 3 days              Patient currently is not medically stable to d/c.  Consultants:   Cardiology  Procedures:     Antimicrobials: Anti-infectives (From  admission, onward)   Start     Dose/Rate Route Frequency Ordered Stop   04/11/20 1000  azithromycin (ZITHROMAX) tablet 500 mg  Status:  Discontinued        500 mg Oral Daily 04/10/20 1420 04/13/20 1610   04/08/20 1500  azithromycin (ZITHROMAX) 250 mg in dextrose 5 % 125 mL IVPB  Status:  Discontinued        250 mg 125 mL/hr over 60 Minutes Intravenous Every 24 hours 04/08/20 1402 04/08/20 1406   04/08/20 1500  azithromycin (ZITHROMAX) 500 mg in sodium chloride 0.9 % 250 mL IVPB  Status:  Discontinued        500 mg 250 mL/hr over 60 Minutes Intravenous Every 24 hours 04/08/20 1407 04/10/20 1420   04/07/20 0600  cefTRIAXone (ROCEPHIN) 1 g in sodium chloride 0.9 % 100 mL IVPB  Status:  Discontinued        1 g 200 mL/hr over 30 Minutes Intravenous Every 24 hours 04/07/20 0553 04/13/20 1610      Subjective: Reports feeling better  Objective: Vitals:   04/13/20 0855 04/13/20 1036 04/13/20 1400 04/13/20 1432  BP:  (!) 140/58  121/65  Pulse:  93  80  Resp:  16  18  Temp:  97.8 F (36.6 C)  98.2 F (36.8 C)  TempSrc:  Oral  Oral  SpO2: 96% 94% 94% 97%  Weight:      Height:        Intake/Output Summary (Last 24 hours) at 04/13/2020 1826 Last data filed at 04/13/2020 1754 Gross per 24 hour  Intake 1379.92 ml  Output 1550 ml  Net -170.08 ml   Filed Weights   04/09/20 0600 04/11/20 0558 04/11/20 1852  Weight: 63.7 kg 75.3 kg 72 kg    Examination: General exam: Conversant, in no acute distress Respiratory system: normal chest rise, clear, no audible wheezing Cardiovascular system: regular rhythm, s1-s2 Gastrointestinal system: Nondistended, nontender, pos BS Central nervous system: No seizures, no tremors Extremities: No cyanosis, no joint deformities Skin: No rashes, no pallor Psychiatry: Affect normal // no auditory hallucinations   Data Reviewed: I have personally reviewed following labs and imaging studies  CBC: Recent Labs  Lab 04/09/20 0252 04/10/20 0248  04/11/20 0246 04/12/20 0537 04/13/20 0525  WBC 9.2 8.6 7.7 5.7 7.7  HGB 9.0* 8.6* 9.1* 8.6* 9.3*  HCT 26.7* 26.6* 27.2* 26.8* 28.8*  MCV 95.0 98.5 96.8 100.4* 99.3  PLT 136* 125* 141* 160 161   Basic Metabolic Panel: Recent Labs  Lab 04/07/20 0527 04/07/20 1047 04/07/20 1334 04/08/20 0529 04/09/20 0252 04/10/20 0248 04/11/20 0246 04/12/20 0537 04/13/20 0525  NA 119*   < > 119*   < > 121* 121* 123* 131* 133*  K 3.0*   < > 3.8   < > 3.9 4.3 4.3 3.7 3.2*  CL 83*   < > 89*   < > 87* 92* 94* 94* 89*  CO2 22   < > 24   < > 23 24 23 31  37*  GLUCOSE 116*   < > 103*   < > 128* 121* 116* 123* 122*  BUN <5*   < > <5*   < >  6* 7* 7* 6* 8  CREATININE 0.65   < > 0.60   < > 0.79 0.72 0.61 0.68 0.83  CALCIUM 7.7*   < > 7.5*   < > 8.0* 8.0* 8.1* 8.4* 8.4*  MG 1.3*  --  2.0  --  1.9  --  1.9  --  2.1   < > = values in this interval not displayed.   GFR: Estimated Creatinine Clearance: 50.4 mL/min (by C-G formula based on SCr of 0.83 mg/dL). Liver Function Tests: Recent Labs  Lab 04/09/20 0252 04/10/20 0248 04/11/20 0246 04/12/20 0537 04/13/20 0525  AST 98* 76* 70* 56* 55*  ALT 37 34 35 33 39  ALKPHOS 126 112 108 99 106  BILITOT 0.9 0.6 0.4 0.4 0.6  PROT 6.2* 5.6* 5.6* 5.1* 5.3*  ALBUMIN 2.7* 2.4* 2.4* 2.2* 2.3*   Recent Labs  Lab 04/07/20 0527 04/09/20 0252  LIPASE 23 25   No results for input(s): AMMONIA in the last 168 hours. Coagulation Profile: Recent Labs  Lab 04/07/20 0527  INR 1.2   Cardiac Enzymes: No results for input(s): CKTOTAL, CKMB, CKMBINDEX, TROPONINI in the last 168 hours. BNP (last 3 results) No results for input(s): PROBNP in the last 8760 hours. HbA1C: No results for input(s): HGBA1C in the last 72 hours. CBG: Recent Labs  Lab 04/07/20 0308  GLUCAP 165*   Lipid Profile: No results for input(s): CHOL, HDL, LDLCALC, TRIG, CHOLHDL, LDLDIRECT in the last 72 hours. Thyroid Function Tests: No results for input(s): TSH, T4TOTAL, FREET4, T3FREE,  THYROIDAB in the last 72 hours. Anemia Panel: No results for input(s): VITAMINB12, FOLATE, FERRITIN, TIBC, IRON, RETICCTPCT in the last 72 hours. Sepsis Labs: Recent Labs  Lab 04/07/20 0657 04/07/20 1047  LATICACIDVEN 3.6* 3.0*    Recent Results (from the past 240 hour(s))  Respiratory Panel by RT PCR (Flu A&B, Covid) - Nasopharyngeal Swab     Status: None   Collection Time: 04/07/20  4:30 AM   Specimen: Nasopharyngeal Swab  Result Value Ref Range Status   SARS Coronavirus 2 by RT PCR NEGATIVE NEGATIVE Final    Comment: (NOTE) SARS-CoV-2 target nucleic acids are NOT DETECTED.  The SARS-CoV-2 RNA is generally detectable in upper respiratoy specimens during the acute phase of infection. The lowest concentration of SARS-CoV-2 viral copies this assay can detect is 131 copies/mL. A negative result does not preclude SARS-Cov-2 infection and should not be used as the sole basis for treatment or other patient management decisions. A negative result may occur with  improper specimen collection/handling, submission of specimen other than nasopharyngeal swab, presence of viral mutation(s) within the areas targeted by this assay, and inadequate number of viral copies (<131 copies/mL). A negative result must be combined with clinical observations, patient history, and epidemiological information. The expected result is Negative.  Fact Sheet for Patients:  PinkCheek.be  Fact Sheet for Healthcare Providers:  GravelBags.it  This test is no t yet approved or cleared by the [place] FDA and  has been authorized for detection and/or diagnosis of SARS-CoV-2 by FDA under an Emergency Use Authorization (EUA). This EUA will remain  in effect (meaning this test can be used) for the duration of the COVID-19 declaration under Section 564(b)(1) of the Act, 21 U.S.C. section 360bbb-3(b)(1), unless the authorization is terminated  or revoked sooner.     Influenza A by PCR NEGATIVE NEGATIVE Final   Influenza B by PCR NEGATIVE NEGATIVE Final    Comment: (NOTE) The Xpert Xpress SARS-CoV-2/FLU/RSV assay  is intended as an aid in  the diagnosis of influenza from Nasopharyngeal swab specimens and  should not be used as a sole basis for treatment. Nasal washings and  aspirates are unacceptable for Xpert Xpress SARS-CoV-2/FLU/RSV  testing.  Fact Sheet for Patients: PinkCheek.be  Fact Sheet for Healthcare Providers: GravelBags.it  This test is not yet approved or cleared by the [place] FDA and  has been authorized for detection and/or diagnosis of SARS-CoV-2 by  FDA under an Emergency Use Authorization (EUA). This EUA will remain  in effect (meaning this test can be used) for the duration of the  Covid-19 declaration under Section 564(b)(1) of the Act, 21  U.S.C. section 360bbb-3(b)(1), unless the authorization is  terminated or revoked. Performed at [facility], [place] [address] [place], [place] [address]   MRSA PCR Screening     Status: None   Collection Time: 04/07/20  5:29 PM   Specimen: Nasal Mucosa; Nasopharyngeal  Result Value Ref Range Status   MRSA by PCR NEGATIVE NEGATIVE Final    Comment:        The GeneXpert MRSA Assay (FDA approved for NASAL specimens only), is one component of a comprehensive MRSA colonization surveillance program. It is not intended to diagnose MRSA infection nor to guide or monitor treatment for MRSA infections. Performed at [facility], [place] [address] [place], [place] [address]      Radiology Studies: No results found.  Scheduled Meds: . ALPRAZolam  1 mg Oral QHS  . aspirin  81 mg Oral Daily  . enoxaparin (LOVENOX) injection  40 mg Subcutaneous Q24H  . fluticasone  2 puff Inhalation BID  . folic acid  1 mg Oral Daily  . furosemide  40 mg Intravenous BID  .  levalbuterol  1.25 mg Nebulization TID  . levothyroxine  100 mcg Oral Q0600  . losartan  25 mg Oral Daily  . mouth rinse  15 mL Mouth Rinse BID  . methylPREDNISolone (SOLU-MEDROL) injection  40 mg Intravenous Q12H  . [START ON 04/14/2020] multivitamin with minerals  1 tablet Oral Daily  . potassium chloride  40 mEq Oral BID  . rosuvastatin  20 mg Oral Daily  . [START ON 04/14/2020] thiamine  100 mg Oral Daily   Continuous Infusions:    LOS: 6 days   Marylu Lund, MD [facility] Pager On Amion  If 7PM-7AM, please contact night-coverage 04/13/2020, 6:26 PM

## 2020-04-13 NOTE — Progress Notes (Signed)
  Speech Language Pathology Treatment: Dysphagia  Patient Details Name: [name] MRN: [id number] DOB: [birth year] Today's Date: 04/13/2020 Time: 2376-2831 SLP Time Calculation (min) (ACUTE ONLY): 15 min  Assessment / Plan / Recommendation Clinical Impression  Pt seen at bedside for follow up after MBS completed 04/11/20. Pt was resting in bed upon arrival of SLP. She reports she had been sitting up in the recliner for awhile today, and has just gotten back to bed. Pt recalled safe swallow precautions including small bites/sips independently. SLP reviewed additional safe swallow strategies including upright position during and for 30 minutes after to facilitate esophageal clearing. Pt was observed drinking thin liquids via straw. She exhibited no overt s/s aspiration. ST will sign off at this time. Please reconsult if needs arise.   HPI HPI: 86 yo female adm to [facility] with AMS, COPD exacerbation and n/v, hospital course complicated by seizure. Pt CT abdomen showed RLL pna.  CT head showed aging brain without acute changes.  Pt is on 3 liters of oxygen at home at baseline.   Pt with H/O ETOH use, lung cancer s/p partial lobectomy, COPD.   She is currently on heparain for NSTEMI.  Pt admits to h/o reflux and frequent belching for which she took a PPI for a period of time. Pt is essentially chair bound at home within the last few months due to frequent falls in the last few months.      SLP Plan  All goals met       Recommendations  Diet recommendations: Dysphagia 3 (mechanical soft);Thin liquid Liquids provided via: Cup;Straw Medication Administration: Whole meds with puree Supervision: Patient able to self feed Compensations: Minimize environmental distractions;Small sips/bites;Slow rate;Other (Comment) Postural Changes and/or Swallow Maneuvers: Out of bed for meals;Seated upright 90 degrees;Upright 30-60 min after meal                Oral Care Recommendations: Oral care  BID Follow up Recommendations: None SLP Visit Diagnosis: Dysphagia, oropharyngeal phase (R13.12) Plan: All goals met       GO               B. Quentin Ore, [facility], [place] Speech Language Pathologist Office: [phone] Pager: [phone]  Shonna Chock 04/13/2020, 3:38 PM

## 2020-04-13 NOTE — Progress Notes (Signed)
Pt requested to have her morning medications split up because she was having trouble swallowing so many pills at one time. Pt takes pills with applesauce to help get them down and larger pills broken in half. Jerene Pitch

## 2020-04-14 LAB — COMPREHENSIVE METABOLIC PANEL
ALT: 48 U/L — ABNORMAL HIGH (ref 0–44)
AST: 67 U/L — ABNORMAL HIGH (ref 15–41)
Albumin: 2.4 g/dL — ABNORMAL LOW (ref 3.5–5.0)
Alkaline Phosphatase: 106 U/L (ref 38–126)
Anion gap: 8 (ref 5–15)
BUN: 11 mg/dL (ref 8–23)
CO2: 40 mmol/L — ABNORMAL HIGH (ref 22–32)
Calcium: 8.5 mg/dL — ABNORMAL LOW (ref 8.9–10.3)
Chloride: 84 mmol/L — ABNORMAL LOW (ref 98–111)
Creatinine, Ser: 0.86 mg/dL (ref 0.44–1.00)
GFR, Estimated: >60 mL/min (ref >60)
Glucose, Bld: 123 mg/dL — ABNORMAL HIGH (ref 70–99)
Potassium: 3.8 mmol/L (ref 3.5–5.1)
Sodium: 132 mmol/L — ABNORMAL LOW (ref 135–145)
Total Bilirubin: 0.7 mg/dL (ref 0.3–1.2)
Total Protein: 5.4 g/dL — ABNORMAL LOW (ref 6.5–8.1)

## 2020-04-14 MED ORDER — POLYETHYLENE GLYCOL 3350 17 G PO PACK
17.0000 g | PACK | Freq: Every day | ORAL | Status: DC
  Administered 2020-04-14 – 2020-04-16 (×2): 17 g via ORAL
  Filled 2020-04-14 (×4): qty 1

## 2020-04-14 MED ORDER — SIMETHICONE 80 MG PO CHEW
80.0000 mg | CHEWABLE_TABLET | Freq: Four times a day (QID) | ORAL | Status: DC | PRN
  Administered 2020-04-14: 80 mg via ORAL
  Filled 2020-04-14: qty 1

## 2020-04-14 NOTE — Progress Notes (Addendum)
PROGRESS NOTE    CIERRIA HEIGHT  ONG:[id number] DOB: 21-Feb-1933 DOA: 04/07/2020 PCP: Binnie Rail, MD    Brief Narrative:  86 year old female with history of COPD on 3 L of oxygen at home, alcohol abuse, hypothyroidism came to ED with nausea and vomiting.  Patient had generalized tonic-clonic seizure in the ED.  She was found to have significant lab abnormalities including hypomagnesemia, magnesium 1.3, hypokalemia potassium 2.7, hyponatremia sodium 119.  BNP was 404, troponin elevated to 3200.  Covid test was negative.  Chest x-ray showed possible infiltrate.   Assessment & Plan:   Principal Problem:   Non-ST elevation MI (NSTEMI) ([place]) Active Problems:   Hypothyroidism   COPD mixed type (HCC)   Non-small cell carcinoma of lung, stage 1 (HCC)   Chronic respiratory failure with hypoxia (HCC)   Nausea & vomiting   Hyponatremia   Alcohol abuse  NSTEMI -Cardiology was consulted -Per Cardiology, continue ASA 81mg , Crestor 20mg .  -Losartan added by Cardiology today -Pt was given heparin x 48hrs, now off -Per Cardiology, pending clinical improvement, can proceed with stress testing in the future as an outpatient. Cardiology has since signed off -Currently remains chest pain free  Seizure -likely from alcohol withdrawal versus metabolic abnormalities.   -Presenting sodium is 119 -EEG reviewed, unremarkable -Dr. Darrick Meigs discussed with neurology who did not recommend seizure meds -Remains seizure free this hospital visit  Alcohol withdrawal-patient drinks alcohol daily -Discussed with family in room. Last ETOH is believed to be on 10/17 -Suspect ETOH is related to below pna, suspect aspiration PNA. Completed course of abx -CIWA now 0. Now off CIWA -Family reports multiple bottles of brandy and other alcoholic beverages were found in the home, currently being discarded by family while pt is in hospital -Pt historically has been hiding alcohol around the house, such as the  closet, without family knowledge. Family reports all ETOH has been discarded in the home -Recommend continued ETOH cessation -No behaviors  Pneumonia -Presenting CT chest obtained is negative for PE, however is pos for right lower lobe pneumonia, given presenting nausea/vomiting in the setting of ETOH abuse, consider aspiration PNA -Completed course of rocephin and azithromycin -clinically improved  Hyponatremia -Presenting sodium is 119 -Sodium up to 132 today -Given significant EtOH hx, consider component of beer potomania vs acute CHF per below -repeat bmet in AM  COPD exacerbation -continue neb tx as neeed -Air movement and wheezing much improved. Continue with breathing tx as tolerated  Hypothyroidism -Presenting TSH is elevated at 10.57, patient is on Synthroid 88 mcg at home.  -Synthroid was recently increased to 100 mcg daily.   -Free T4 was within normal limits -Possible element of sick euthyroid -Recommend repeat TSH in 4-6 weeks  Intractable nausea and vomiting --CT abdomen/pelvis is unremarkable.  Lipase 23.  Continue Zofran as needed for nausea vomiting. -suspect related to on-going ETOH abuse -Appreciate assistance by SLP, pt is cleared for dysphagia 3 diet  Acute Systolic CHF, new finding thus chronicity is unknown -2d echo with EF 45-50% -Recent BNP elevated at 404 -Wt peaked up to 75kg 10/23 from 63kg 10/19 -Continue pt on 40mg  IV BID lasix. Diuresing well -Wt today 66.7kg -follow I/o and daily wts  Decision making capacity -Patient is alert and oriented x 3 (person, place, time) and is able to verbalize to me why she is in hospital and what management entails. Pt has demonstrated that she does have decision making capacity at this time. Pt following commands, not confused and is  appropriate  DVT prophylaxis: Lovenox subq Code Status: DNR Family Communication: Pt in room, pt's family is currently bedside  Status is: Inpatient  Remains inpatient  appropriate because:Unsafe d/c plan   Dispo: The patient is from: Home              Anticipated d/c is to: SNF              Anticipated d/c date is: > 3 days              Patient currently is not medically stable to d/c.  Consultants:   Cardiology  Procedures:     Antimicrobials: Anti-infectives (From admission, onward)   Start     Dose/Rate Route Frequency Ordered Stop   04/11/20 1000  azithromycin (ZITHROMAX) tablet 500 mg  Status:  Discontinued        500 mg Oral Daily 04/10/20 1420 04/13/20 1610   04/08/20 1500  azithromycin (ZITHROMAX) 250 mg in dextrose 5 % 125 mL IVPB  Status:  Discontinued        250 mg 125 mL/hr over 60 Minutes Intravenous Every 24 hours 04/08/20 1402 04/08/20 1406   04/08/20 1500  azithromycin (ZITHROMAX) 500 mg in sodium chloride 0.9 % 250 mL IVPB  Status:  Discontinued        500 mg 250 mL/hr over 60 Minutes Intravenous Every 24 hours 04/08/20 1407 04/10/20 1420   04/07/20 0600  cefTRIAXone (ROCEPHIN) 1 g in sodium chloride 0.9 % 100 mL IVPB  Status:  Discontinued        1 g 200 mL/hr over 30 Minutes Intravenous Every 24 hours 04/07/20 0553 04/13/20 1610      Subjective: States feeling better. Denies sob. Reports feeling markedly weak in her legs  Objective: Vitals:   04/14/20 0512 04/14/20 0827 04/14/20 1211 04/14/20 1305  BP: (!) 149/88  121/65 121/86  Pulse: 71  80 88  Resp: 18  18   Temp: 97.8 F (36.6 C)  97.9 F (36.6 C)   TempSrc:      SpO2: 100% 97% 95% 96%  Weight:      Height:        Intake/Output Summary (Last 24 hours) at 04/14/2020 1502 Last data filed at 04/14/2020 1224 Gross per 24 hour  Intake 490.44 ml  Output 1200 ml  Net -709.56 ml   Filed Weights   04/11/20 0558 04/11/20 1852 04/14/20 0413  Weight: 75.3 kg 72 kg 66.7 kg    Examination: General exam: Conversant, in no acute distress Respiratory system: normal chest rise, clear, trace end-expiratory wheezing Cardiovascular system: regular rhythm,  s1-s2 Gastrointestinal system: Nondistended, nontender, pos BS Central nervous system: No seizures, no tremors Extremities: No cyanosis, no joint deformities Skin: No rashes, no pallor Psychiatry: Affect normal // no auditory hallucinations    Data Reviewed: I have personally reviewed following labs and imaging studies  CBC: Recent Labs  Lab 04/09/20 0252 04/10/20 0248 04/11/20 0246 04/12/20 0537 04/13/20 0525  WBC 9.2 8.6 7.7 5.7 7.7  HGB 9.0* 8.6* 9.1* 8.6* 9.3*  HCT 26.7* 26.6* 27.2* 26.8* 28.8*  MCV 95.0 98.5 96.8 100.4* 99.3  PLT 136* 125* 141* 160 235   Basic Metabolic Panel: Recent Labs  Lab 04/09/20 0252 04/09/20 0252 04/10/20 0248 04/11/20 0246 04/12/20 0537 04/13/20 0525 04/14/20 0503  NA 121*   < > 121* 123* 131* 133* 132*  K 3.9   < > 4.3 4.3 3.7 3.2* 3.8  CL 87*   < > 92* 94*  94* 89* 84*  CO2 23   < > 24 23 31  37* 40*  GLUCOSE 128*   < > 121* 116* 123* 122* 123*  BUN 6*   < > 7* 7* 6* 8 11  CREATININE 0.79   < > 0.72 0.61 0.68 0.83 0.86  CALCIUM 8.0*   < > 8.0* 8.1* 8.4* 8.4* 8.5*  MG 1.9  --   --  1.9  --  2.1  --    < > = values in this interval not displayed.   GFR: Estimated Creatinine Clearance: 44.8 mL/min (by C-G formula based on SCr of 0.86 mg/dL). Liver Function Tests: Recent Labs  Lab 04/10/20 0248 04/11/20 0246 04/12/20 0537 04/13/20 0525 04/14/20 0503  AST 76* 70* 56* 55* 67*  ALT 34 35 33 39 48*  ALKPHOS 112 108 99 106 106  BILITOT 0.6 0.4 0.4 0.6 0.7  PROT 5.6* 5.6* 5.1* 5.3* 5.4*  ALBUMIN 2.4* 2.4* 2.2* 2.3* 2.4*   Recent Labs  Lab 04/09/20 0252  LIPASE 25   No results for input(s): AMMONIA in the last 168 hours. Coagulation Profile: No results for input(s): INR, PROTIME in the last 168 hours. Cardiac Enzymes: No results for input(s): CKTOTAL, CKMB, CKMBINDEX, TROPONINI in the last 168 hours. BNP (last 3 results) No results for input(s): PROBNP in the last 8760 hours. HbA1C: No results for input(s): HGBA1C in the  last 72 hours. CBG: No results for input(s): GLUCAP in the last 168 hours. Lipid Profile: No results for input(s): CHOL, HDL, LDLCALC, TRIG, CHOLHDL, LDLDIRECT in the last 72 hours. Thyroid Function Tests: No results for input(s): TSH, T4TOTAL, FREET4, T3FREE, THYROIDAB in the last 72 hours. Anemia Panel: No results for input(s): VITAMINB12, FOLATE, FERRITIN, TIBC, IRON, RETICCTPCT in the last 72 hours. Sepsis Labs: No results for input(s): PROCALCITON, LATICACIDVEN in the last 168 hours.  Recent Results (from the past 240 hour(s))  Respiratory Panel by RT PCR (Flu A&B, Covid) - Nasopharyngeal Swab     Status: None   Collection Time: 04/07/20  4:30 AM   Specimen: Nasopharyngeal Swab  Result Value Ref Range Status   SARS Coronavirus 2 by RT PCR NEGATIVE NEGATIVE Final    Comment: (NOTE) SARS-CoV-2 target nucleic acids are NOT DETECTED.  The SARS-CoV-2 RNA is generally detectable in upper respiratoy specimens during the acute phase of infection. The lowest concentration of SARS-CoV-2 viral copies this assay can detect is 131 copies/mL. A negative result does not preclude SARS-Cov-2 infection and should not be used as the sole basis for treatment or other patient management decisions. A negative result may occur with  improper specimen collection/handling, submission of specimen other than nasopharyngeal swab, presence of viral mutation(s) within the areas targeted by this assay, and inadequate number of viral copies (<131 copies/mL). A negative result must be combined with clinical observations, patient history, and epidemiological information. The expected result is Negative.  Fact Sheet for Patients:  PinkCheek.be  Fact Sheet for Healthcare Providers:  GravelBags.it  This test is no t yet approved or cleared by the [place] FDA and  has been authorized for detection and/or diagnosis of SARS-CoV-2 by FDA under an  Emergency Use Authorization (EUA). This EUA will remain  in effect (meaning this test can be used) for the duration of the COVID-19 declaration under Section 564(b)(1) of the Act, 21 U.S.C. section 360bbb-3(b)(1), unless the authorization is terminated or revoked sooner.     Influenza A by PCR NEGATIVE NEGATIVE Final   Influenza B  by PCR NEGATIVE NEGATIVE Final    Comment: (NOTE) The Xpert Xpress SARS-CoV-2/FLU/RSV assay is intended as an aid in  the diagnosis of influenza from Nasopharyngeal swab specimens and  should not be used as a sole basis for treatment. Nasal washings and  aspirates are unacceptable for Xpert Xpress SARS-CoV-2/FLU/RSV  testing.  Fact Sheet for Patients: PinkCheek.be  Fact Sheet for Healthcare Providers: GravelBags.it  This test is not yet approved or cleared by the [place] FDA and  has been authorized for detection and/or diagnosis of SARS-CoV-2 by  FDA under an Emergency Use Authorization (EUA). This EUA will remain  in effect (meaning this test can be used) for the duration of the  Covid-19 declaration under Section 564(b)(1) of the Act, 21  U.S.C. section 360bbb-3(b)(1), unless the authorization is  terminated or revoked. Performed at [facility], [place] [address], [place], [place] [address]   MRSA PCR Screening     Status: None   Collection Time: 04/07/20  5:29 PM   Specimen: Nasal Mucosa; Nasopharyngeal  Result Value Ref Range Status   MRSA by PCR NEGATIVE NEGATIVE Final    Comment:        The GeneXpert MRSA Assay (FDA approved for NASAL specimens only), is one component of a comprehensive MRSA colonization surveillance program. It is not intended to diagnose MRSA infection nor to guide or monitor treatment for MRSA infections. Performed at [facility], [place] [address] [place], [place] [address]      Radiology Studies: No results  found.  Scheduled Meds: . ALPRAZolam  1 mg Oral QHS  . aspirin  81 mg Oral Daily  . enoxaparin (LOVENOX) injection  40 mg Subcutaneous Q24H  . fluticasone  2 puff Inhalation BID  . folic acid  1 mg Oral Daily  . furosemide  40 mg Intravenous BID  . levalbuterol  1.25 mg Nebulization TID  . levothyroxine  100 mcg Oral Q0600  . losartan  25 mg Oral Daily  . mouth rinse  15 mL Mouth Rinse BID  . methylPREDNISolone (SOLU-MEDROL) injection  40 mg Intravenous Q12H  . multivitamin with minerals  1 tablet Oral Daily  . rosuvastatin  20 mg Oral Daily  . thiamine  100 mg Oral Daily   Continuous Infusions:    LOS: 7 days   Marylu Lund, MD [facility] Pager On Amion  If 7PM-7AM, please contact night-coverage 04/14/2020, 3:01 PM

## 2020-04-14 NOTE — Progress Notes (Signed)
Occupational Therapy Treatment Patient Details Name: [name] MRN: [id number] DOB: 27-Sep-1932 Today's Date: 04/14/2020    History of present illness 86 y.o. female with history of severe COPD/emphysema on 3L [place], small cell lung cancer s/p right upper lobectomy in 2012, alcohol abuse with history of withdrawals who presented with nausea, vomiting and abdominal pain found to have NSTEMI with trop 3265 as well as significant hyponatremia. Now on medical management for NSTEMI.   OT comments  Patient progressing and showed improved overall ADL performance compared to previous session as evidenced by increased score from 12/24 to now 40/97 on AM-PAC "6-Clicks" Measure of Occupational Performance. Patient limited by abdominal "gas" pain, dizziness and diaphoresis symptoms when OOB, along with deficits noted below. Pt expressed disappointment today in having dizziness with return to bed and stated that she would like to move ad lib. Pt shown AROM HEP at bed level to UEs and LEs and encouraged to elevate HOB ad lib to adjust to being upright and prepare for increased mobility.  Pt continues to demonstrate good rehab potential and would benefit from continued skilled OT to increase safety and independence with ADLs and functional transfers to allow pt to return home safely and reduce caregiver burden and fall risk.   Follow Up Recommendations  SNF    Equipment Recommendations  None recommended by OT    Recommendations for Other Services      Precautions / Restrictions Precautions Precautions: Fall Precaution Comments: o2 sats can drop, NRB to recover. Dizziness with OOB Restrictions Weight Bearing Restrictions: No       Mobility Bed Mobility Overal bed mobility: Needs Assistance Bed Mobility: Supine to Sit;Sit to Supine     Supine to sit: HOB elevated;Min assist Sit to supine: Min assist;HOB elevated   General bed mobility comments: Heavy reliance on bed rails.  Pt modifies sit  to supine, lifting RLE with LLE.  Transfers Overall transfer level: Needs assistance Equipment used: Rolling walker (2 wheeled) Transfers: Sit to/from [facility]   Stand pivot transfers: Mod assist       General transfer comment: Sit <> stand from EOB to RW with cues for hand placement, and Mod Assist to complete transfer. Mod as for walker mngt and cues for walker proximity.    Balance Overall balance assessment: History of Falls;Needs assistance Sitting-balance support: No upper extremity supported;Feet supported Sitting balance-Leahy Scale: Fair     Standing balance support: Bilateral upper extremity supported;During functional activity Standing balance-Leahy Scale: Poor Standing balance comment: reliant on external support                           ADL either performed or assessed with clinical judgement   ADL Overall ADL's : Needs assistance/impaired     Grooming: Set up;Sitting;Wash/dry hands                   Toilet Transfer: BSC;Moderate assistance;RW;Cueing for sequencing Toilet Transfer Details (indicate cue type and reason): Pt performed stand pivot from EOB <--> BSC with verbal cues and RW with need of Mod As. RT ball of foot WB due to old RLE injury and LT foot hopping x 1, otherwise shuffles/pivots and does not leave the floor. Toileting- Clothing Manipulation and Hygiene: Moderate assistance;Sit to/from stand;Minimal assistance Toileting - Clothing Manipulation Details (indicate cue type and reason): Pt stood  from [facility] and performed hygiene with toilet paper placed in pt's right hand and cues for safety with  Min As for standing balance. Pt required Mod assist to complete peri hygiene thoroughly.     Functional mobility during ADLs: Moderate assistance;Rolling walker;Cueing for safety;Cueing for sequencing       Vision   Vision Assessment?: No apparent visual deficits   Perception     Praxis      Cognition  Arousal/Alertness: Awake/alert Behavior During Therapy: WFL for tasks assessed/performed Overall Cognitive Status: Within Functional Limits for tasks assessed                                          Exercises     Shoulder Instructions       General Comments      Pertinent Vitals/ Pain       Pain Assessment: 0-10 Pain Score: 8  Pain Location: "gas pain" Pain Intervention(s): Limited activity within patient's tolerance;Monitored during session;Repositioned (Assisted to commode and encouraged to increase movement in bed. Pt belching throughout session.)  Home Living                                          Prior Functioning/Environment              Frequency  Min 2X/week        Progress Toward Goals  OT Goals(current goals can now be found in the care plan section)  Progress towards OT goals: Progressing toward goals  Acute Rehab OT Goals Patient Stated Goal: "Get up better and walk more." OT Goal Formulation: With patient Time For Goal Achievement: 04/24/20 Potential to Achieve Goals: Good  Plan Discharge plan remains appropriate    Co-evaluation                 AM-PAC OT "6 Clicks" Daily Activity     Outcome Measure   Help from another person eating meals?: None Help from another person taking care of personal grooming?: A Little Help from another person toileting, which includes using toliet, bedpan, or urinal?: A Lot Help from another person bathing (including washing, rinsing, drying)?: A Lot Help from another person to put on and taking off regular upper body clothing?: A Lot Help from another person to put on and taking off regular lower body clothing?: Total 6 Click Score: 14    End of Session Equipment Utilized During Treatment: Oxygen;Gait belt;Rolling walker  OT Visit Diagnosis: Unsteadiness on feet (R26.81);Muscle weakness (generalized) (M62.81)   Activity Tolerance Patient tolerated treatment  well   Patient Left in bed;with call bell/phone within reach;with bed alarm set   Nurse Communication Mobility status        Time: 3007-6226 OT Time Calculation (min): 39 min  Charges: OT General Charges $OT Visit: 1 Visit OT Treatments $Self Care/Home Management : 23-37 mins $Therapeutic Activity: 8-22 mins  Anderson Malta, [place] Office: [phone] 04/14/2020   Julien Girt 04/14/2020, 1:15 PM

## 2020-04-15 ENCOUNTER — Telehealth: Payer: Self-pay | Admitting: Internal Medicine

## 2020-04-15 DIAGNOSIS — J189 Pneumonia, unspecified organism: Secondary | ICD-10-CM

## 2020-04-15 DIAGNOSIS — I5041 Acute combined systolic (congestive) and diastolic (congestive) heart failure: Secondary | ICD-10-CM

## 2020-04-15 DIAGNOSIS — Z0189 Encounter for other specified special examinations: Secondary | ICD-10-CM

## 2020-04-15 DIAGNOSIS — R569 Unspecified convulsions: Secondary | ICD-10-CM

## 2020-04-15 LAB — CBC
HCT: 31.4 % — ABNORMAL LOW (ref 36.0–46.0)
Hemoglobin: 9.9 g/dL — ABNORMAL LOW (ref 12.0–15.0)
MCH: 31.5 pg (ref 26.0–34.0)
MCHC: 31.5 g/dL (ref 30.0–36.0)
MCV: 100 fL (ref 80.0–100.0)
Platelets: 186 10*3/uL (ref 150–400)
RBC: 3.14 MIL/uL — ABNORMAL LOW (ref 3.87–5.11)
RDW: 14.2 % (ref 11.5–15.5)
WBC: 10.1 10*3/uL (ref 4.0–10.5)
nRBC: 0 % (ref 0.0–0.2)

## 2020-04-15 LAB — COMPREHENSIVE METABOLIC PANEL
ALT: 46 U/L — ABNORMAL HIGH (ref 0–44)
AST: 54 U/L — ABNORMAL HIGH (ref 15–41)
Albumin: 2.5 g/dL — ABNORMAL LOW (ref 3.5–5.0)
Alkaline Phosphatase: 103 U/L (ref 38–126)
Anion gap: 9 (ref 5–15)
BUN: 14 mg/dL (ref 8–23)
CO2: 41 mmol/L — ABNORMAL HIGH (ref 22–32)
Calcium: 8.7 mg/dL — ABNORMAL LOW (ref 8.9–10.3)
Chloride: 82 mmol/L — ABNORMAL LOW (ref 98–111)
Creatinine, Ser: 0.78 mg/dL (ref 0.44–1.00)
GFR, Estimated: >60 mL/min (ref >60)
Glucose, Bld: 115 mg/dL — ABNORMAL HIGH (ref 70–99)
Potassium: 3.6 mmol/L (ref 3.5–5.1)
Sodium: 132 mmol/L — ABNORMAL LOW (ref 135–145)
Total Bilirubin: 0.8 mg/dL (ref 0.3–1.2)
Total Protein: 5.3 g/dL — ABNORMAL LOW (ref 6.5–8.1)

## 2020-04-15 MED ORDER — PREDNISONE 20 MG PO TABS
40.0000 mg | ORAL_TABLET | Freq: Every day | ORAL | Status: DC
  Administered 2020-04-16 – 2020-04-17 (×2): 40 mg via ORAL
  Filled 2020-04-15 (×2): qty 2

## 2020-04-15 NOTE — Assessment & Plan Note (Addendum)
-    Last ETOH is believed to be on 10/17 -Initially monitored on CIWA protocol -Family reports multiple bottles of brandy and other alcoholic beverages were found in the home, currently being discarded by family while pt is in hospital -A P2P was held on 10/28; I also then called and talked with patient's daughter, [name], at length after.  Patient will be accepted to rehab contingent upon increased involvement of family with patient regarding her alcohol abuse at home.  She may possibly be too deconditioned and weak for recovering in rehab, but if she has any chance, she must remain abstinent from alcohol going forward.  Family is in agreement and states they will be very involved helping the house remain alcohol free and involved with patient with not letting her begin drinking again.  Daughter was very grateful for patient still being able to go to rehab and states they will remain involved with positive encouragement to the patient with alcohol abstinence from now forward.

## 2020-04-15 NOTE — Hospital Course (Addendum)
[name] is an 86 year old female with history of COPD on 3 L of oxygen at home, alcohol abuse, hypothyroidism who came to ED with nausea and vomiting.  Patient had a generalized tonic-clonic seizure in the ED.  She was found to have significant lab abnormalities including hypomagnesemia, magnesium 1.3, hypokalemia potassium 2.7, hyponatremia sodium 119.  BNP was 404, troponin elevated to 3200.   Covid test was negative.  Chest x-ray showed possible infiltrate. She was evaluated by cardiology in setting of NSTEMI; she was started on heparin drip x 48 hours then continued on aspirin. Recommendation is for possible outpatient stress testing pending her clinical improvement and progress with rehab.  For her seizure, she was evaluated by neurology.  Etiology was considered possibly in setting of alcohol withdrawal as well as severe metabolic derangements.  She was not recommended to start any AEDs.  She was evaluated by PT and recommended for SNF at discharge. There were several discussions held with family and patient regarding this need and recommendation. I heavily counseled patient on need for rehab as well as abstinence from further etoh use. Also discussed this with her daughter on the phone prior to the patient leaving the hospital.

## 2020-04-15 NOTE — Assessment & Plan Note (Addendum)
-  2d echo with EF 45-50% -Recent BNP elevated at 404 -Continue pt on 40mg  IV BID lasix. Diuresing well - transition to PO lasix on 10/28; she remains net negative

## 2020-04-15 NOTE — Progress Notes (Signed)
PROGRESS NOTE    [name]   ZSW:[id number]  DOB: 1933/03/09  DOA: 04/07/2020     8  PCP: Binnie Rail, MD  CC: N/V  Hospital Course: [name] is an 86 year old female with history of COPD on 3 L of oxygen at home, alcohol abuse, hypothyroidism who came to ED with nausea and vomiting.  Patient had a generalized tonic-clonic seizure in the ED.  She was found to have significant lab abnormalities including hypomagnesemia, magnesium 1.3, hypokalemia potassium 2.7, hyponatremia sodium 119.  BNP was 404, troponin elevated to 3200.   Covid test was negative.  Chest x-ray showed possible infiltrate. She was evaluated by cardiology in setting of NSTEMI; she was started on heparin drip x 48 hours then continued on aspirin. Recommendation is for possible outpatient stress testing pending her clinical improvement and progress with rehab.  For her seizure, she was evaluated by neurology.  Etiology was considered possibly in setting of alcohol withdrawal as well as severe metabolic derangements.  She was not recommended to start any AEDs.   Interval History:  No events overnight. She was wanting to go home this am instead of rehab. Apparently this has been an issue previously as well. Her husband spoke with her today and patient now is amenable to rehab at discharge.   Old records reviewed in assessment of this patient  ROS: Constitutional: negative for chills and fevers, Respiratory: negative for cough and dyspnea on exertion, Cardiovascular: negative for chest pain and Gastrointestinal: negative for abdominal pain  Assessment & Plan: * Non-ST elevation MI (NSTEMI) [facility]) - Cardiology was consulted -Per Cardiology, continue ASA 81mg , Crestor 20mg .  -Losartan added by Cardiology -Pt was given heparin x 48hrs, now off -Per Cardiology, pending clinical improvement, can proceed with stress testing in the future as an outpatient. Cardiology has since signed off -Currently remains chest  pain free  Acute combined systolic (congestive) and diastolic (congestive) heart failure ([place]) -2d echo with EF 45-50% -Recent BNP elevated at 404 -Continue pt on 40mg  IV BID lasix. Diuresing well - transition to PO lasix on 10/28; she is now net negative 1.2L   Seizure (HCC)-resolved as of 04/15/2020 - likely from alcohol withdrawal versus metabolic abnormalities or combo -Presenting sodium is 119 -EEG reviewed, unremarkable -Dr. Darrick Meigs discussed with neurology who did not recommend seizure meds -Remains seizure free this hospital visit  Encounter for assessment of decision-making capacity - agreed she has decision making capacity; she remains AOx3 and can explain reasoning behind decisions as well as consequences/risks/benefits  - on 10/27 she was quite convinced that she wanted to go home instead of rehab at discharge however after daughter and husband talked to patient she is now at this time amenable to rehab once again   Alcohol abuse -  Last ETOH is believed to be on 10/17 -Initially monitored on CIWA protocol -Family reports multiple bottles of brandy and other alcoholic beverages were found in the home, currently being discarded by family while pt is in hospital -Pt historically has been hiding alcohol around the house, such as the closet, without family knowledge. Family reports all ETOH has been discarded in the home -Recommend continued ETOH cessation  COPD with acute exacerbation ([place]) - continue neb tx as neeed -Air movement and wheezing much improved. Continue with breathing tx as tolerated -Change steroids to p.o.   Hypothyroidism Presenting TSH is elevated at 10.57,patient is on Synthroid 88 mcg at home.  -Synthroid was recently increased to 100 mcg daily. -Free  T4 was within normal limits -Possible element of sick euthyroid -Recommend repeat TSH in 4-6 weeks  Hyponatremia - responded well to treatment - now improved   Chronic respiratory failure with  hypoxia (HCC) -On 3 L nasal cannula chronically.  Currently at baseline  Nausea & vomiting-resolved as of 04/15/2020 - likely etoh w/d and metabolic abnormalities  CAP (community acquired pneumonia)-resolved as of 04/15/2020 - Presenting CT chest obtained is negative for PE, however is pos for right lower lobe pneumonia, given presenting nausea/vomiting in the setting of ETOH abuse, consider aspiration PNA -Completed course of rocephin and azithromycin -clinically improved    Antimicrobials: Azithro CTX  DVT prophylaxis: Lovenox Code Status: DNR Family Communication: none present Disposition Plan: Status is: Inpatient  Remains inpatient appropriate because:Unsafe d/c plan, IV treatments appropriate due to intensity of illness or inability to take PO and Inpatient level of care appropriate due to severity of illness   Dispo: The patient is from: Home              Anticipated d/c is to: SNF              Anticipated d/c date is: 2 days              Patient currently is not medically stable to d/c.  Objective: Blood pressure 114/64, pulse 83, temperature 98 F (36.7 C), resp. rate 19, height 5\' 6"  (1.676 m), weight 67.9 kg, SpO2 100 %.  Examination: General appearance: alert, cooperative and no distress Head: Normocephalic, without obvious abnormality, atraumatic Eyes: EOMI Lungs: coarse breath sounds bilaterally Heart: regular rate and rhythm and S1, S2 normal Abdomen: soft, ND, NT, BS present Extremities: no edema Skin: mobility and turgor normal Neurologic: Grossly normal  Consultants:   Cardiology  Procedures:   n/a  Data Reviewed: I have personally reviewed following labs and imaging studies Results for orders placed or performed during the hospital encounter of 04/07/20 (from the past 24 hour(s))  Comprehensive metabolic panel     Status: Abnormal   Collection Time: 04/15/20  4:39 AM  Result Value Ref Range   Sodium 132 (L) 135 - 145 mmol/L   Potassium 3.6  3.5 - 5.1 mmol/L   Chloride 82 (L) 98 - 111 mmol/L   CO2 41 (H) 22 - 32 mmol/L   Glucose, Bld 115 (H) 70 - 99 mg/dL   BUN 14 8 - 23 mg/dL   Creatinine, Ser 0.78 0.44 - 1.00 mg/dL   Calcium 8.7 (L) 8.9 - 10.3 mg/dL   Total Protein 5.3 (L) 6.5 - 8.1 g/dL   Albumin 2.5 (L) 3.5 - 5.0 g/dL   AST 54 (H) 15 - 41 U/L   ALT 46 (H) 0 - 44 U/L   Alkaline Phosphatase 103 38 - 126 U/L   Total Bilirubin 0.8 0.3 - 1.2 mg/dL   GFR, Estimated >60 >60 mL/min   Anion gap 9 5 - 15  CBC     Status: Abnormal   Collection Time: 04/15/20  4:39 AM  Result Value Ref Range   WBC 10.1 4.0 - 10.5 K/uL   RBC 3.14 (L) 3.87 - 5.11 MIL/uL   Hemoglobin 9.9 (L) 12.0 - 15.0 g/dL   HCT 31.4 (L) 36 - 46 %   MCV 100.0 80.0 - 100.0 fL   MCH 31.5 26.0 - 34.0 pg   MCHC 31.5 30.0 - 36.0 g/dL   RDW 14.2 11.5 - 15.5 %   Platelets 186 150 - 400 K/uL   nRBC  0.0 0.0 - 0.2 %    Recent Results (from the past 240 hour(s))  Respiratory Panel by RT PCR (Flu A&B, Covid) - Nasopharyngeal Swab     Status: None   Collection Time: 04/07/20  4:30 AM   Specimen: Nasopharyngeal Swab  Result Value Ref Range Status   SARS Coronavirus 2 by RT PCR NEGATIVE NEGATIVE Final    Comment: (NOTE) SARS-CoV-2 target nucleic acids are NOT DETECTED.  The SARS-CoV-2 RNA is generally detectable in upper respiratoy specimens during the acute phase of infection. The lowest concentration of SARS-CoV-2 viral copies this assay can detect is 131 copies/mL. A negative result does not preclude SARS-Cov-2 infection and should not be used as the sole basis for treatment or other patient management decisions. A negative result may occur with  improper specimen collection/handling, submission of specimen other than nasopharyngeal swab, presence of viral mutation(s) within the areas targeted by this assay, and inadequate number of viral copies (<131 copies/mL). A negative result must be combined with clinical observations, patient history, and  epidemiological information. The expected result is Negative.  Fact Sheet for Patients:  PinkCheek.be  Fact Sheet for Healthcare Providers:  GravelBags.it  This test is no t yet approved or cleared by the [place] FDA and  has been authorized for detection and/or diagnosis of SARS-CoV-2 by FDA under an Emergency Use Authorization (EUA). This EUA will remain  in effect (meaning this test can be used) for the duration of the COVID-19 declaration under Section 564(b)(1) of the Act, 21 U.S.C. section 360bbb-3(b)(1), unless the authorization is terminated or revoked sooner.     Influenza A by PCR NEGATIVE NEGATIVE Final   Influenza B by PCR NEGATIVE NEGATIVE Final    Comment: (NOTE) The Xpert Xpress SARS-CoV-2/FLU/RSV assay is intended as an aid in  the diagnosis of influenza from Nasopharyngeal swab specimens and  should not be used as a sole basis for treatment. Nasal washings and  aspirates are unacceptable for Xpert Xpress SARS-CoV-2/FLU/RSV  testing.  Fact Sheet for Patients: PinkCheek.be  Fact Sheet for Healthcare Providers: GravelBags.it  This test is not yet approved or cleared by the [place] FDA and  has been authorized for detection and/or diagnosis of SARS-CoV-2 by  FDA under an Emergency Use Authorization (EUA). This EUA will remain  in effect (meaning this test can be used) for the duration of the  Covid-19 declaration under Section 564(b)(1) of the Act, 21  U.S.C. section 360bbb-3(b)(1), unless the authorization is  terminated or revoked. Performed at [facility], [place] [address] [place], [place] [address]   MRSA PCR Screening     Status: None   Collection Time: 04/07/20  5:29 PM   Specimen: Nasal Mucosa; Nasopharyngeal  Result Value Ref Range Status   MRSA by PCR NEGATIVE NEGATIVE Final    Comment:        The  GeneXpert MRSA Assay (FDA approved for NASAL specimens only), is one component of a comprehensive MRSA colonization surveillance program. It is not intended to diagnose MRSA infection nor to guide or monitor treatment for MRSA infections. Performed at [facility], [place] [address] [place], [place] [address]      Radiology Studies: No results found. DG Swallowing Func-Speech Pathology  Final Result    CT Angio Chest PE W and/or Wo Contrast  Final Result    CT ABDOMEN PELVIS W CONTRAST  Final Result    CT Head Wo Contrast  Final Result    DG  Chest 2 View  Final Result      Scheduled Meds:  ALPRAZolam  1 mg Oral QHS   aspirin  81 mg Oral Daily   enoxaparin (LOVENOX) injection  40 mg Subcutaneous Q24H   fluticasone  2 puff Inhalation BID   folic acid  1 mg Oral Daily   furosemide  40 mg Intravenous BID   levalbuterol  1.25 mg Nebulization TID   levothyroxine  100 mcg Oral Q0600   losartan  25 mg Oral Daily   mouth rinse  15 mL Mouth Rinse BID   multivitamin with minerals  1 tablet Oral Daily   polyethylene glycol  17 g Oral Daily   [START ON 04/16/2020] predniSONE  40 mg Oral Q breakfast   rosuvastatin  20 mg Oral Daily   thiamine  100 mg Oral Daily   PRN Meds: guaiFENesin, levalbuterol, morphine injection, ondansetron (ZOFRAN) IV, simethicone Continuous Infusions:   LOS: 8 days  Time spent: Greater than 50% of the 35 minute visit was spent in counseling/coordination of care for the patient as laid out in the A&P.   Dwyane Dee, MD [facility] 04/15/2020, 3:02 PM

## 2020-04-15 NOTE — TOC Progression Note (Addendum)
Transition of Care [facility]) - Progression Note    Patient Details  Name: SAHARAH SHERROW MRN: [id number] Date of Birth: 1932/12/21  [facility]) CM/SW Contact  Keli Buehner, Juliann Pulse, RN Phone Number: 04/15/2020, 10:31 AM  Clinical Narrative:  Damaris Schooner to patient/dtr(Cindy) on phone about SNF-patient willing to go to rehab. Will send updated info for pasrr.Initiated auth for SNF/PTAR.  4p-received [id number] E-dtr chose [facility]-updated HTA-still awaiting auth for SNF/PTAR.   Expected Discharge Plan: Skilled Nursing Facility Barriers to Discharge: Ship broker  Expected Discharge Plan and Services Expected Discharge Plan: [place]   Discharge Planning Services: CM Consult Post Acute Care Choice: [place] Living arrangements for the past 2 months: Single Family Home                                       Social Determinants of Health (SDOH) Interventions    Readmission Risk Interventions No flowsheet data found.

## 2020-04-15 NOTE — TOC Progression Note (Signed)
Transition of Care [facility]) - Progression Note    Patient Details  Name: [name] MRN: [id number] Date of Birth: 1933/02/09  Transition [facility] Care [facility]) CM/SW Contact  Mable Dara, Juliann Pulse, RN Phone Number: 04/15/2020, 10:04 AM  Clinical Narrative: Initiated insurance auth w/HTA for SNF/PTAR-await outcome.Spoke to [employer] about choice of SNF-she is still determining-await choice.Will submit additional info for pasrr.COVID needed.      Expected Discharge Plan: Skilled Nursing Facility Barriers to Discharge: Ship broker  Expected Discharge Plan and Services Expected Discharge Plan: [place]   Discharge Planning Services: CM Consult Post Acute Care Choice: [place] Living arrangements for the past 2 months: Single Family Home                                       Social Determinants of Health (SDOH) Interventions    Readmission Risk Interventions No flowsheet data found.

## 2020-04-15 NOTE — Assessment & Plan Note (Signed)
-  On 3 L nasal cannula chronically.  Currently at baseline

## 2020-04-15 NOTE — Assessment & Plan Note (Signed)
-   Presenting CT chest obtained is negative for PE, however is pos for right lower lobe pneumonia, given presenting nausea/vomiting in the setting of ETOH abuse, consider aspiration PNA -Completed course of rocephin and azithromycin -clinically improved

## 2020-04-15 NOTE — Telephone Encounter (Signed)
   Patients daughter [name] calling to inform Dr Quay Burow patient is currently in the hospital. She states the patient is not wanting to go to SNF as discussed with discharge team.  [name] states the last time patient had home health she was noT compliant and did not fully participate. She is unsure what to do for her mother. The father is in the home but he is unable to care for patient. Advised daughter to discuss more with hospital Case Manager regarding discharge plan. She is seeking advice from Dr Quay Burow

## 2020-04-15 NOTE — Assessment & Plan Note (Signed)
-   responded well to treatment - now improved

## 2020-04-15 NOTE — Assessment & Plan Note (Signed)
-   agreed she has decision making capacity; she remains AOx3 and can explain reasoning behind decisions as well as consequences/risks/benefits  - on 10/27 she was quite convinced that she wanted to go home instead of rehab at discharge however after daughter and husband talked to patient she is now at this time amenable to rehab once again

## 2020-04-15 NOTE — Assessment & Plan Note (Addendum)
-   continue neb tx as neeed -Air movement and wheezing much improved. Continue with breathing tx as tolerated -Change steroids to p.o. and finish course at discharge

## 2020-04-15 NOTE — Telephone Encounter (Signed)
Spoke with daughter today. [name] has agreed to go to a skilled nursing facility. She will be going to [facility]

## 2020-04-15 NOTE — Telephone Encounter (Signed)
I really think she needs to go to rehab - she is not going to do what she needs to do at home and will likely end up back in the hospital --  She needs to understand she needs to get stronger to be able to stay at home.

## 2020-04-15 NOTE — Progress Notes (Signed)
Physical Therapy Treatment Patient Details Name: [name] MRN: [id number] DOB: Mar 22, 1933 Today's Date: 04/15/2020    History of Present Illness 86 y.o. female with history of severe COPD/emphysema on 3L [place], small cell lung cancer s/p right upper lobectomy in 2012, alcohol abuse with history of withdrawals who presented with nausea, vomiting and abdominal pain found to have NSTEMI with trop 3265 as well as significant hyponatremia. Now on medical management for NSTEMI.    PT Comments    Mod assist to take several pivotal steps from bed to recliner with RW. Activity tolerance limited by fatigue and dyspnea, SaO2 93% on 3L O2 with minimal activity, breath sounds are congested. Performed BUE/LE strengthening exercises in sitting.   Follow Up Recommendations  SNF     Equipment Recommendations  None recommended by PT    Recommendations for Other Services       Precautions / Restrictions Precautions Precautions: Fall Precaution Comments: pt reports a fall in the past several weeks, monitor O2 Restrictions Weight Bearing Restrictions: No    Mobility  Bed Mobility Overal bed mobility: Needs Assistance Bed Mobility: Supine to Sit     Supine to sit: Min assist;HOB elevated     General bed mobility comments: used rail, HOB up, min A to advance LEs and raise trunk  Transfers Overall transfer level: Needs assistance Equipment used: Rolling walker (2 wheeled) Transfers: Sit to/from [facility] Sit to Stand: Mod assist Stand pivot transfers: Mod assist       General transfer comment: assist to rise and to control descent to recliner, VCs positioning in RW  Ambulation/Gait Ambulation/Gait assistance: Mod assist Gait Distance (Feet): 3 Feet Assistive device: Rolling walker (2 wheeled) Gait Pattern/deviations: Step-to pattern;Decreased stride length;Decreased step length - right;Decreased step length - left;Trunk flexed Gait velocity: decr   General  Gait Details: distance limited by fatigue/SOB, SaO2 93% on 3L O2 [place], 3/4 dyspnea with activity, congested breath sounds   Stairs             Wheelchair Mobility    Modified Rankin (Stroke Patients Only)       Balance Overall balance assessment: History of Falls;Needs assistance Sitting-balance support: No upper extremity supported;Feet supported Sitting balance-Leahy Scale: Fair     Standing balance support: Bilateral upper extremity supported;During functional activity Standing balance-Leahy Scale: Poor Standing balance comment: reliant on external support                            Cognition Arousal/Alertness: Awake/alert Behavior During Therapy: WFL for tasks assessed/performed Overall Cognitive Status: Within Functional Limits for tasks assessed                                        Exercises General Exercises - Upper Extremity Shoulder Flexion: AROM;Both;10 reps;Seated General Exercises - Lower Extremity Ankle Circles/Pumps: AROM;Both;10 reps;Seated Long Arc Quad: AROM;Both;10 reps;Seated Hip Flexion/Marching: AROM;Both;10 reps;Seated    General Comments        Pertinent Vitals/Pain Pain Score: 5  Pain Location: "gas pain" in L lower abdomen Pain Descriptors / Indicators: Aching Pain Intervention(s): Limited activity within patient's tolerance;Monitored during session;Repositioned    Home Living                      Prior Function            PT  Goals (current goals can now be found in the care plan section) Acute Rehab PT Goals Patient Stated Goal: 'to move more so I can get rid of this gas" PT Goal Formulation: With patient Time For Goal Achievement: 04/24/20 Potential to Achieve Goals: Fair Progress towards PT goals: Progressing toward goals    Frequency    Min 3X/week      PT Plan Current plan remains appropriate    Co-evaluation              AM-PAC PT "6 Clicks" Mobility   Outcome  Measure  Help needed turning from your back to your side while in a flat bed without using bedrails?: A Little Help needed moving from lying on your back to sitting on the side of a flat bed without using bedrails?: A Lot Help needed moving to and from a bed to a chair (including a wheelchair)?: A Lot Help needed standing up from a chair using your arms (e.g., wheelchair or bedside chair)?: A Lot Help needed to walk in hospital room?: Total Help needed climbing 3-5 steps with a railing? : Total 6 Click Score: 11    End of Session Equipment Utilized During Treatment: Gait belt;Oxygen Activity Tolerance: Patient tolerated treatment well Patient left: in chair;with call bell/phone within reach;with chair alarm set Nurse Communication: Mobility status PT Visit Diagnosis: Unsteadiness on feet (R26.81);Muscle weakness (generalized) (M62.81);Difficulty in walking, not elsewhere classified (R26.2)     Time: 3903-0092 PT Time Calculation (min) (ACUTE ONLY): 20 min  Charges:  $Therapeutic Activity: 8-22 mins                    Blondell Reveal Kistler PT 04/15/2020  Acute Rehabilitation Services Pager [phone] Office [phone]

## 2020-04-15 NOTE — Assessment & Plan Note (Addendum)
Presenting TSH is elevated at 10.57,patient is on Synthroid 88 mcg at home.  -Synthroid was recently increased to 100 mcg daily. -Free T4 was within normal limits -Possible element of sick euthyroid -Recommend repeat TSH in 4-6 weeks

## 2020-04-15 NOTE — Assessment & Plan Note (Signed)
-   Cardiology was consulted -Per Cardiology, continue ASA 81mg , Crestor 20mg .  -Losartan added by Cardiology -Pt was given heparin x 48hrs, now off -Per Cardiology, pending clinical improvement, can proceed with stress testing in the future as an outpatient. Cardiology has since signed off -Currently remains chest pain free

## 2020-04-15 NOTE — Assessment & Plan Note (Signed)
-   likely etoh w/d and metabolic abnormalities

## 2020-04-15 NOTE — Assessment & Plan Note (Signed)
-   likely from alcohol withdrawal versus metabolic abnormalities or combo -Presenting sodium is 119 -EEG reviewed, unremarkable -Dr. Darrick Meigs discussed with neurology who did not recommend seizure meds -Remains seizure free this hospital visit

## 2020-04-16 LAB — CBC WITH DIFFERENTIAL/PLATELET
Abs Immature Granulocytes: 0.05 10*3/uL (ref 0.00–0.07)
Basophils Absolute: 0 10*3/uL (ref 0.0–0.1)
Basophils Relative: 0 %
Eosinophils Absolute: 0 10*3/uL (ref 0.0–0.5)
Eosinophils Relative: 0 %
HCT: 31 % — ABNORMAL LOW (ref 36.0–46.0)
Hemoglobin: 9.9 g/dL — ABNORMAL LOW (ref 12.0–15.0)
Immature Granulocytes: 1 %
Lymphocytes Relative: 11 %
Lymphs Abs: 1 10*3/uL (ref 0.7–4.0)
MCH: 31.7 pg (ref 26.0–34.0)
MCHC: 31.9 g/dL (ref 30.0–36.0)
MCV: 99.4 fL (ref 80.0–100.0)
Monocytes Absolute: 0.7 10*3/uL (ref 0.1–1.0)
Monocytes Relative: 8 %
Neutro Abs: 7.5 10*3/uL (ref 1.7–7.7)
Neutrophils Relative %: 80 %
Platelets: 181 10*3/uL (ref 150–400)
RBC: 3.12 MIL/uL — ABNORMAL LOW (ref 3.87–5.11)
RDW: 14.2 % (ref 11.5–15.5)
WBC: 9.3 10*3/uL (ref 4.0–10.5)
nRBC: 0 % (ref 0.0–0.2)

## 2020-04-16 LAB — COMPREHENSIVE METABOLIC PANEL
ALT: 38 U/L (ref 0–44)
AST: 42 U/L — ABNORMAL HIGH (ref 15–41)
Albumin: 2.2 g/dL — ABNORMAL LOW (ref 3.5–5.0)
Alkaline Phosphatase: 99 U/L (ref 38–126)
Anion gap: 8 (ref 5–15)
BUN: 14 mg/dL (ref 8–23)
CO2: 43 mmol/L — ABNORMAL HIGH (ref 22–32)
Calcium: 8.7 mg/dL — ABNORMAL LOW (ref 8.9–10.3)
Chloride: 82 mmol/L — ABNORMAL LOW (ref 98–111)
Creatinine, Ser: 0.79 mg/dL (ref 0.44–1.00)
GFR, Estimated: >60 mL/min (ref >60)
Glucose, Bld: 92 mg/dL (ref 70–99)
Potassium: 3.3 mmol/L — ABNORMAL LOW (ref 3.5–5.1)
Sodium: 133 mmol/L — ABNORMAL LOW (ref 135–145)
Total Bilirubin: 0.8 mg/dL (ref 0.3–1.2)
Total Protein: 5.1 g/dL — ABNORMAL LOW (ref 6.5–8.1)

## 2020-04-16 LAB — MAGNESIUM: Magnesium: 2 mg/dL (ref 1.7–2.4)

## 2020-04-16 MED ORDER — LEVALBUTEROL HCL 1.25 MG/0.5ML IN NEBU
1.2500 mg | INHALATION_SOLUTION | Freq: Two times a day (BID) | RESPIRATORY_TRACT | Status: DC
  Administered 2020-04-16: 1.25 mg via RESPIRATORY_TRACT
  Filled 2020-04-16 (×2): qty 0.5

## 2020-04-16 MED ORDER — FUROSEMIDE 40 MG PO TABS
40.0000 mg | ORAL_TABLET | Freq: Two times a day (BID) | ORAL | Status: DC
  Administered 2020-04-16 – 2020-04-17 (×3): 40 mg via ORAL
  Filled 2020-04-16 (×3): qty 1

## 2020-04-16 NOTE — Care Management Important Message (Signed)
Important Message  Patient Details IM Letter given to the Patient Name: [name] MRN: [id number] Date of Birth: [birth year]   Medicare Important Message Given:  Yes     Kerin Salen 04/16/2020, 11:07 AM

## 2020-04-16 NOTE — Progress Notes (Signed)
PROGRESS NOTE    [name]   XVQ:[id number]  DOB: 18-Jul-1932  DOA: 04/07/2020     9  PCP: Binnie Rail, MD  CC: N/V  Hospital Course: [name] is an 86 year old female with history of COPD on 3 L of oxygen at home, alcohol abuse, hypothyroidism who came to ED with nausea and vomiting.  Patient had a generalized tonic-clonic seizure in the ED.  She was found to have significant lab abnormalities including hypomagnesemia, magnesium 1.3, hypokalemia potassium 2.7, hyponatremia sodium 119.  BNP was 404, troponin elevated to 3200.   Covid test was negative.  Chest x-ray showed possible infiltrate. She was evaluated by cardiology in setting of NSTEMI; she was started on heparin drip x 48 hours then continued on aspirin. Recommendation is for possible outpatient stress testing pending her clinical improvement and progress with rehab.  For her seizure, she was evaluated by neurology.  Etiology was considered possibly in setting of alcohol withdrawal as well as severe metabolic derangements.  She was not recommended to start any AEDs.   Interval History:  No events overnight.  She was amenable for going to rehab after talking to her husband and daughter yesterday.  I had a peer to peer call with insurance this morning as well.  I then called and talked to her daughter after the phone call regarding the patient's alcohol abuse at home.  See below for further details of discussion.  Overall, family will remain involved going forward with helping the patient remain abstinent from alcohol.  The hope is that she recovers enough strength and rehab to become mobile again.  Old records reviewed in assessment of this patient  ROS: Constitutional: negative for chills and fevers, Respiratory: negative for cough and dyspnea on exertion, Cardiovascular: negative for chest pain and Gastrointestinal: negative for abdominal pain  Assessment & Plan: * Non-ST elevation MI (NSTEMI) [facility]) - Cardiology  was consulted -Per Cardiology, continue ASA 81mg , Crestor 20mg .  -Losartan added by Cardiology -Pt was given heparin x 48hrs, now off -Per Cardiology, pending clinical improvement, can proceed with stress testing in the future as an outpatient. Cardiology has since signed off -Currently remains chest pain free  Alcohol abuse -  Last ETOH is believed to be on 10/17 -Initially monitored on CIWA protocol -Family reports multiple bottles of brandy and other alcoholic beverages were found in the home, currently being discarded by family while pt is in hospital -A P2P was held on 10/28; I also then called and talked with patient's daughter, [name], at length after.  Patient will be accepted to rehab contingent upon increased involvement of family with patient regarding her alcohol abuse at home.  She may possibly be too deconditioned and weak for recovering in rehab, but if she has any chance, she must remain abstinent from alcohol going forward.  Family is in agreement and states they will be very involved helping the house remain alcohol free and involved with patient with not letting her begin drinking again.  Daughter was very grateful for patient still being able to go to rehab and states they will remain involved with positive encouragement to the patient with alcohol abstinence from now forward.  Acute combined systolic (congestive) and diastolic (congestive) heart failure ([place]) -2d echo with EF 45-50% -Recent BNP elevated at 404 -Continue pt on 40mg  IV BID lasix. Diuresing well - transition to PO lasix on 10/28; she remains net negative   Seizure (HCC)-resolved as of 04/15/2020 - likely from alcohol withdrawal  versus metabolic abnormalities or combo -Presenting sodium is 119 -EEG reviewed, unremarkable -Dr. Darrick Meigs discussed with neurology who did not recommend seizure meds -Remains seizure free this hospital visit  Encounter for assessment of decision-making capacity - agreed she has  decision making capacity; she remains AOx3 and can explain reasoning behind decisions as well as consequences/risks/benefits  - on 10/27 she was quite convinced that she wanted to go home instead of rehab at discharge however after daughter and husband talked to patient she is now at this time amenable to rehab once again   COPD with acute exacerbation ([place]) - continue neb tx as neeed -Air movement and wheezing much improved. Continue with breathing tx as tolerated -Change steroids to p.o.   Hypothyroidism Presenting TSH is elevated at 10.57,patient is on Synthroid 88 mcg at home.  -Synthroid was recently increased to 100 mcg daily. -Free T4 was within normal limits -Possible element of sick euthyroid -Recommend repeat TSH in 4-6 weeks  Hyponatremia - responded well to treatment - now improved   Chronic respiratory failure with hypoxia (HCC) -On 3 L nasal cannula chronically.  Currently at baseline  Nausea & vomiting-resolved as of 04/15/2020 - likely etoh w/d and metabolic abnormalities  CAP (community acquired pneumonia)-resolved as of 04/15/2020 - Presenting CT chest obtained is negative for PE, however is pos for right lower lobe pneumonia, given presenting nausea/vomiting in the setting of ETOH abuse, consider aspiration PNA -Completed course of rocephin and azithromycin -clinically improved   Antimicrobials: Azithro CTX  DVT prophylaxis: Lovenox Code Status: DNR Family Communication: none present Disposition Plan: Status is: Inpatient  Remains inpatient appropriate because:Unsafe d/c plan, IV treatments appropriate due to intensity of illness or inability to take PO and Inpatient level of care appropriate due to severity of illness   Dispo: The patient is from: Home              Anticipated d/c is to: SNF -accepted to [facility]              Anticipated d/c date is: Tentative discharge on Friday to [facility]              Patient currently is not medically  stable to d/c.  Objective: Blood pressure 120/63, pulse 76, temperature 98 F (36.7 C), temperature source Oral, resp. rate 18, height 5\' 6"  (1.676 m), weight 67.5 kg, SpO2 92 %.  Examination: General appearance: alert, cooperative and no distress Head: Normocephalic, without obvious abnormality, atraumatic Eyes: EOMI Lungs: coarse breath sounds bilaterally Heart: regular rate and rhythm and S1, S2 normal Abdomen: soft, ND, NT, BS present Extremities: no edema Skin: mobility and turgor normal Neurologic: Grossly normal  Consultants:   Cardiology  Procedures:   n/a  Data Reviewed: I have personally reviewed following labs and imaging studies Results for orders placed or performed during the hospital encounter of 04/07/20 (from the past 24 hour(s))  CBC with Differential/Platelet     Status: Abnormal   Collection Time: 04/16/20  4:03 AM  Result Value Ref Range   WBC 9.3 4.0 - 10.5 K/uL   RBC 3.12 (L) 3.87 - 5.11 MIL/uL   Hemoglobin 9.9 (L) 12.0 - 15.0 g/dL   HCT 31.0 (L) 36 - 46 %   MCV 99.4 80.0 - 100.0 fL   MCH 31.7 26.0 - 34.0 pg   MCHC 31.9 30.0 - 36.0 g/dL   RDW 14.2 11.5 - 15.5 %   Platelets 181 150 - 400 K/uL   nRBC 0.0 0.0 - 0.2 %  Neutrophils Relative % 80 %   Neutro Abs 7.5 1.7 - 7.7 K/uL   Lymphocytes Relative 11 %   Lymphs Abs 1.0 0.7 - 4.0 K/uL   Monocytes Relative 8 %   Monocytes Absolute 0.7 0.1 - 1.0 K/uL   Eosinophils Relative 0 %   Eosinophils Absolute 0.0 0.0 - 0.5 K/uL   Basophils Relative 0 %   Basophils Absolute 0.0 0.0 - 0.1 K/uL   Immature Granulocytes 1 %   Abs Immature Granulocytes 0.05 0.00 - 0.07 K/uL  Comprehensive metabolic panel     Status: Abnormal   Collection Time: 04/16/20  4:03 AM  Result Value Ref Range   Sodium 133 (L) 135 - 145 mmol/L   Potassium 3.3 (L) 3.5 - 5.1 mmol/L   Chloride 82 (L) 98 - 111 mmol/L   CO2 43 (H) 22 - 32 mmol/L   Glucose, Bld 92 70 - 99 mg/dL   BUN 14 8 - 23 mg/dL   Creatinine, Ser 0.79 0.44 - 1.00  mg/dL   Calcium 8.7 (L) 8.9 - 10.3 mg/dL   Total Protein 5.1 (L) 6.5 - 8.1 g/dL   Albumin 2.2 (L) 3.5 - 5.0 g/dL   AST 42 (H) 15 - 41 U/L   ALT 38 0 - 44 U/L   Alkaline Phosphatase 99 38 - 126 U/L   Total Bilirubin 0.8 0.3 - 1.2 mg/dL   GFR, Estimated >60 >60 mL/min   Anion gap 8 5 - 15  Magnesium     Status: None   Collection Time: 04/16/20  4:03 AM  Result Value Ref Range   Magnesium 2.0 1.7 - 2.4 mg/dL    Recent Results (from the past 240 hour(s))  Respiratory Panel by RT PCR (Flu A&B, Covid) - Nasopharyngeal Swab     Status: None   Collection Time: 04/07/20  4:30 AM   Specimen: Nasopharyngeal Swab  Result Value Ref Range Status   SARS Coronavirus 2 by RT PCR NEGATIVE NEGATIVE Final    Comment: (NOTE) SARS-CoV-2 target nucleic acids are NOT DETECTED.  The SARS-CoV-2 RNA is generally detectable in upper respiratoy specimens during the acute phase of infection. The lowest concentration of SARS-CoV-2 viral copies this assay can detect is 131 copies/mL. A negative result does not preclude SARS-Cov-2 infection and should not be used as the sole basis for treatment or other patient management decisions. A negative result may occur with  improper specimen collection/handling, submission of specimen other than nasopharyngeal swab, presence of viral mutation(s) within the areas targeted by this assay, and inadequate number of viral copies (<131 copies/mL). A negative result must be combined with clinical observations, patient history, and epidemiological information. The expected result is Negative.  Fact Sheet for Patients:  PinkCheek.be  Fact Sheet for Healthcare Providers:  GravelBags.it  This test is no t yet approved or cleared by the [place] FDA and  has been authorized for detection and/or diagnosis of SARS-CoV-2 by FDA under an Emergency Use Authorization (EUA). This EUA will remain  in effect (meaning this  test can be used) for the duration of the COVID-19 declaration under Section 564(b)(1) of the Act, 21 U.S.C. section 360bbb-3(b)(1), unless the authorization is terminated or revoked sooner.     Influenza A by PCR NEGATIVE NEGATIVE Final   Influenza B by PCR NEGATIVE NEGATIVE Final    Comment: (NOTE) The Xpert Xpress SARS-CoV-2/FLU/RSV assay is intended as an aid in  the diagnosis of influenza from Nasopharyngeal swab specimens and  should not  be used as a sole basis for treatment. Nasal washings and  aspirates are unacceptable for Xpert Xpress SARS-CoV-2/FLU/RSV  testing.  Fact Sheet for Patients: PinkCheek.be  Fact Sheet for Healthcare Providers: GravelBags.it  This test is not yet approved or cleared by the [place] FDA and  has been authorized for detection and/or diagnosis of SARS-CoV-2 by  FDA under an Emergency Use Authorization (EUA). This EUA will remain  in effect (meaning this test can be used) for the duration of the  Covid-19 declaration under Section 564(b)(1) of the Act, 21  U.S.C. section 360bbb-3(b)(1), unless the authorization is  terminated or revoked. Performed at [facility], [place] [address], [place], [place] [address]   MRSA PCR Screening     Status: None   Collection Time: 04/07/20  5:29 PM   Specimen: Nasal Mucosa; Nasopharyngeal  Result Value Ref Range Status   MRSA by PCR NEGATIVE NEGATIVE Final    Comment:        The GeneXpert MRSA Assay (FDA approved for NASAL specimens only), is one component of a comprehensive MRSA colonization surveillance program. It is not intended to diagnose MRSA infection nor to guide or monitor treatment for MRSA infections. Performed at [facility], [place] [address] [place], [place] [address]      Radiology Studies: No results found. DG Swallowing Func-Speech Pathology  Final Result    CT Angio Chest PE W  and/or Wo Contrast  Final Result    CT ABDOMEN PELVIS W CONTRAST  Final Result    CT Head Wo Contrast  Final Result    DG Chest 2 View  Final Result      Scheduled Meds: . ALPRAZolam  1 mg Oral QHS  . aspirin  81 mg Oral Daily  . enoxaparin (LOVENOX) injection  40 mg Subcutaneous Q24H  . fluticasone  2 puff Inhalation BID  . folic acid  1 mg Oral Daily  . furosemide  40 mg Oral BID  . levalbuterol  1.25 mg Nebulization BID  . levothyroxine  100 mcg Oral Q0600  . losartan  25 mg Oral Daily  . mouth rinse  15 mL Mouth Rinse BID  . multivitamin with minerals  1 tablet Oral Daily  . polyethylene glycol  17 g Oral Daily  . predniSONE  40 mg Oral Q breakfast  . rosuvastatin  20 mg Oral Daily  . thiamine  100 mg Oral Daily   PRN Meds: guaiFENesin, levalbuterol, morphine injection, ondansetron (ZOFRAN) IV, simethicone Continuous Infusions:   LOS: 9 days  Time spent: Greater than 50% of the 35 minute visit was spent in counseling/coordination of care for the patient as laid out in the A&P.   Dwyane Dee, MD [facility] 04/16/2020, 1:16 PM

## 2020-04-16 NOTE — TOC Progression Note (Signed)
Transition of Care [facility]) - Progression Note    Patient Details  Name: [name] MRN: [id number] Date of Birth: 04-06-1933  Transition of [facility]) CM/SW Contact  Elinda Bunten, Juliann Pulse, RN Phone Number: 04/16/2020, 10:55 AM  Clinical Narrative: P2P approved for SNF-[facility] 7 days DXIP#[id number]/NLZJ QBHA#[id number]. SNF bed available in am.      Expected Discharge Plan: [place] Barriers to Discharge: Other (comment) (SNF bed available in am-[facility].)  Expected Discharge Plan and Services Expected Discharge Plan: [place]   Discharge Planning Services: CM Consult Post Acute Care Choice: [place] Living arrangements for the past 2 months: Single Family Home                                       Social Determinants of Health (SDOH) Interventions    Readmission Risk Interventions No flowsheet data found.

## 2020-04-16 NOTE — Progress Notes (Signed)
Occupational Therapy Treatment Patient Details Name: [name] MRN: [id number] DOB: 1932-11-01 Today's Date: 04/16/2020    History of present illness 86 y.o. female with history of severe COPD/emphysema on 3L [place], small cell lung cancer s/p right upper lobectomy in 2012, alcohol abuse with history of withdrawals who presented with nausea, vomiting and abdominal pain found to have NSTEMI with trop 3265 as well as significant hyponatremia. Now on medical management for NSTEMI.   OT comments  Patient demonstrates improved ability to perform functional mobility and tolerance for activity including exercises. Patient min assist for supine to sit, min guard to stand and min assist to pivot to recliner placed on her right. Patient performed UB and LB exercises in chair. Short rest breaks needed. Patient exhibited "wheezing" with breathing with activity which she reports is normal.   Follow Up Recommendations  SNF    Equipment Recommendations  None recommended by OT    Recommendations for Other Services      Precautions / Restrictions Precautions Precautions: Fall Precaution Comments: pt reports a fall in the past several weeks, monitor O2, RLE weak Restrictions Weight Bearing Restrictions: No       Mobility Bed Mobility Overal bed mobility: Needs Assistance Bed Mobility: Supine to Sit     Supine to sit: Min assist;HOB elevated     General bed mobility comments: Min assist for scooting to edge of bed otherwise patinet able to perform with use of bed rail.  Transfers Overall transfer level: Needs assistance Equipment used: None Transfers: Sit to/from [facility] Sit to Stand: Min guard Stand pivot transfers: Min assist       General transfer comment: min guard to stand from bed - though patient maintained flexe trunk position, min assist to pivot to recliner. Patietn reports RLE "won't work"    Balance Overall balance assessment: Needs  assistance;History of Falls Sitting-balance support: No upper extremity supported;Feet supported Sitting balance-Leahy Scale: Good     Standing balance support: Bilateral upper extremity supported;During functional activity Standing balance-Leahy Scale: Poor Standing balance comment: reliant on upper extremity support                           ADL either performed or assessed with clinical judgement   ADL                                               Vision Patient Visual Report: No change from baseline Vision Assessment?: No apparent visual deficits   Perception     Praxis      Cognition Arousal/Alertness: Awake/alert Behavior During Therapy: WFL for tasks assessed/performed Overall Cognitive Status: Within Functional Limits for tasks assessed                                          Exercises Other Exercises Other Exercises: GYI:RSWN extension, hip hikes x 10 each leg Other Exercises: IO:[id number] flexion x 10,   Shoulder Instructions       General Comments      Pertinent Vitals/ Pain       Pain Assessment: No/denies pain  Home Living  Prior Functioning/Environment              Frequency  Min 2X/week        Progress Toward Goals  OT Goals(current goals can now be found in the care plan section)  Progress towards OT goals: Progressing toward goals  Acute Rehab OT Goals Patient Stated Goal: to get stronger with rehab OT Goal Formulation: With patient Time For Goal Achievement: 04/24/20 Potential to Achieve Goals: Good  Plan Discharge plan remains appropriate    Co-evaluation          OT goals addressed during session: Strengthening/ROM (functional mobility)      AM-PAC OT "6 Clicks" Daily Activity     Outcome Measure   Help from another person eating meals?: None Help from another person taking care of personal grooming?: A  Little Help from another person toileting, which includes using toliet, bedpan, or urinal?: A Lot Help from another person bathing (including washing, rinsing, drying)?: A Lot Help from another person to put on and taking off regular upper body clothing?: A Little Help from another person to put on and taking off regular lower body clothing?: Total 6 Click Score: 15    End of Session Equipment Utilized During Treatment: Gait belt;Oxygen  OT Visit Diagnosis: Unsteadiness on feet (R26.81);Muscle weakness (generalized) (M62.81)   Activity Tolerance Patient tolerated treatment well   Patient Left in chair;with call bell/phone within reach;with chair alarm set   Nurse Communication Mobility status        Time: 7628-3151 OT Time Calculation (min): 18 min  Charges: OT General Charges $OT Visit: 1 Visit OT Treatments $Therapeutic Activity: 8-22 mins  Derl Barrow, OTR/L [place]  Office [phone] Pager: [place] 04/16/2020, 5:17 PM

## 2020-04-17 DIAGNOSIS — J449 Chronic obstructive pulmonary disease, unspecified: Secondary | ICD-10-CM | POA: Diagnosis not present

## 2020-04-17 DIAGNOSIS — I5041 Acute combined systolic (congestive) and diastolic (congestive) heart failure: Secondary | ICD-10-CM | POA: Diagnosis not present

## 2020-04-17 DIAGNOSIS — F1010 Alcohol abuse, uncomplicated: Secondary | ICD-10-CM | POA: Diagnosis not present

## 2020-04-17 DIAGNOSIS — D649 Anemia, unspecified: Secondary | ICD-10-CM | POA: Diagnosis not present

## 2020-04-17 DIAGNOSIS — Z7401 Bed confinement status: Secondary | ICD-10-CM | POA: Diagnosis not present

## 2020-04-17 DIAGNOSIS — R569 Unspecified convulsions: Secondary | ICD-10-CM | POA: Diagnosis not present

## 2020-04-17 DIAGNOSIS — I214 Non-ST elevation (NSTEMI) myocardial infarction: Secondary | ICD-10-CM | POA: Diagnosis not present

## 2020-04-17 DIAGNOSIS — E039 Hypothyroidism, unspecified: Secondary | ICD-10-CM | POA: Diagnosis not present

## 2020-04-17 DIAGNOSIS — J189 Pneumonia, unspecified organism: Secondary | ICD-10-CM | POA: Diagnosis not present

## 2020-04-17 DIAGNOSIS — I959 Hypotension, unspecified: Secondary | ICD-10-CM | POA: Diagnosis not present

## 2020-04-17 DIAGNOSIS — M2550 Pain in unspecified joint: Secondary | ICD-10-CM | POA: Diagnosis not present

## 2020-04-17 LAB — COMPREHENSIVE METABOLIC PANEL
ALT: 39 U/L (ref 0–44)
AST: 43 U/L — ABNORMAL HIGH (ref 15–41)
Albumin: 2.1 g/dL — ABNORMAL LOW (ref 3.5–5.0)
Alkaline Phosphatase: 106 U/L (ref 38–126)
Anion gap: 9 (ref 5–15)
BUN: 14 mg/dL (ref 8–23)
CO2: 42 mmol/L — ABNORMAL HIGH (ref 22–32)
Calcium: 8.5 mg/dL — ABNORMAL LOW (ref 8.9–10.3)
Chloride: 81 mmol/L — ABNORMAL LOW (ref 98–111)
Creatinine, Ser: 0.79 mg/dL (ref 0.44–1.00)
GFR, Estimated: >60 mL/min (ref >60)
Glucose, Bld: 93 mg/dL (ref 70–99)
Potassium: 3.5 mmol/L (ref 3.5–5.1)
Sodium: 132 mmol/L — ABNORMAL LOW (ref 135–145)
Total Bilirubin: 0.7 mg/dL (ref 0.3–1.2)
Total Protein: 5.1 g/dL — ABNORMAL LOW (ref 6.5–8.1)

## 2020-04-17 LAB — CBC WITH DIFFERENTIAL/PLATELET
Abs Immature Granulocytes: 0.04 10*3/uL (ref 0.00–0.07)
Basophils Absolute: 0 10*3/uL (ref 0.0–0.1)
Basophils Relative: 0 %
Eosinophils Absolute: 0 10*3/uL (ref 0.0–0.5)
Eosinophils Relative: 0 %
HCT: 29.4 % — ABNORMAL LOW (ref 36.0–46.0)
Hemoglobin: 9.6 g/dL — ABNORMAL LOW (ref 12.0–15.0)
Immature Granulocytes: 1 %
Lymphocytes Relative: 12 %
Lymphs Abs: 0.9 10*3/uL (ref 0.7–4.0)
MCH: 31.9 pg (ref 26.0–34.0)
MCHC: 32.7 g/dL (ref 30.0–36.0)
MCV: 97.7 fL (ref 80.0–100.0)
Monocytes Absolute: 0.8 10*3/uL (ref 0.1–1.0)
Monocytes Relative: 11 %
Neutro Abs: 5.8 10*3/uL (ref 1.7–7.7)
Neutrophils Relative %: 76 %
Platelets: 161 10*3/uL (ref 150–400)
RBC: 3.01 MIL/uL — ABNORMAL LOW (ref 3.87–5.11)
RDW: 14.1 % (ref 11.5–15.5)
WBC: 7.5 10*3/uL (ref 4.0–10.5)
nRBC: 0 % (ref 0.0–0.2)

## 2020-04-17 LAB — SARS CORONAVIRUS 2 BY RT PCR (HOSPITAL ORDER, PERFORMED IN ~~LOC~~ HOSPITAL LAB): SARS Coronavirus 2: NEGATIVE

## 2020-04-17 LAB — MAGNESIUM: Magnesium: 2.1 mg/dL (ref 1.7–2.4)

## 2020-04-17 MED ORDER — ROSUVASTATIN CALCIUM 20 MG PO TABS: 20.0000 mg | ORAL_TABLET | Freq: Every day | ORAL | Status: AC

## 2020-04-17 MED ORDER — FUROSEMIDE 40 MG PO TABS: 40.0000 mg | ORAL_TABLET | Freq: Two times a day (BID) | ORAL | Status: AC

## 2020-04-17 MED ORDER — LEVOTHYROXINE SODIUM 100 MCG PO TABS: 100.0000 ug | ORAL_TABLET | Freq: Every day | ORAL | Status: AC

## 2020-04-17 MED ORDER — PREDNISONE 20 MG PO TABS: 40.0000 mg | ORAL_TABLET | Freq: Every day | ORAL | 0 refills | Status: AC

## 2020-04-17 MED ORDER — LOSARTAN POTASSIUM 25 MG PO TABS: 25.0000 mg | ORAL_TABLET | Freq: Every day | ORAL | Status: AC

## 2020-04-17 MED ORDER — GUAIFENESIN ER 600 MG PO TB12: 600.0000 mg | ORAL_TABLET | Freq: Two times a day (BID) | ORAL | Status: AC | PRN

## 2020-04-17 MED ORDER — ASPIRIN 81 MG PO CHEW: 81.0000 mg | CHEWABLE_TABLET | Freq: Every day | ORAL | Status: AC

## 2020-04-17 NOTE — Progress Notes (Signed)
Called [facility] to give patient report. Nurse unable to take report at this time.  Phone number given and stated they will call this RN back for report.

## 2020-04-17 NOTE — TOC Transition Note (Addendum)
Transition of Care [facility]) - CM/SW Discharge Note   Patient Details  Name: [name] MRN: [id number] Date of Birth: 03/13/1933  Transition [facility] Care [facility]) CM/SW Contact:  Dessa Phi, RN Phone Number: 04/17/2020, 11:23 AM   Clinical Narrative: d/c to [facility] rep Janie aware-going to rm#[id number],nsg call report tel#[phone]-PTAR called for a scheduled pick up @ 2p-nsg aware.forms @ nsg station. No further CM needs.      Final next level of care: Skilled Nursing Facility Barriers to Discharge: No Barriers Identified   Patient Goals and CMS Choice Patient states their goals for this hospitalization and ongoing recovery are:: go to rehab CMS Medicare.gov Compare Post Acute Care list provided to:: Patient (dtr Cindy [phone]) Choice offered to / list presented to : Adult Children  Discharge Placement PASRR number recieved: 04/14/20            Patient [facility] bed at: [facility] Patient to be transferred to facility by: [place] Name of family member notified: cindy [phone] Patient and family notified of of transfer: 04/17/20  Discharge Plan and Services   Discharge Planning Services: CM Consult Post Acute Care Choice: [place]                               Social Determinants of Health (SDOH) Interventions     Readmission Risk Interventions No flowsheet data found.

## 2020-04-17 NOTE — Progress Notes (Signed)
Physical Therapy Treatment Patient Details Name: [name] MRN: [id number] DOB: 05/01/1933 Today's Date: 04/17/2020    History of Present Illness 86 y.o. female with history of severe COPD/emphysema on 3L [place], small cell lung cancer s/p right upper lobectomy in 2012, alcohol abuse with history of withdrawals who presented with nausea, vomiting and abdominal pain found to have NSTEMI with trop 3265 as well as significant hyponatremia. Now on medical management for NSTEMI.    PT Comments    Patient is progressing nicely with Acute PT and required decreased assist for bed mobility and transfers with RW today. She was able to transfer bed>BSC with min assist and cues for safe walker positioning. Pt required Max assist for pericare and was reliant on bil UE support of walker in standing. She was able to take several steps to amb from [facility] to recliner. Cues required for safety with turning to sit in chair and for safe reach back. She will continue to benefit from skilled PT interventions in SNF setting to progress mobility.   Follow Up Recommendations  SNF     Equipment Recommendations  None recommended by PT    Recommendations for Other Services       Precautions / Restrictions Precautions Precautions: Fall Precaution Comments: pt reports a fall in the past several weeks, monitor O2, RLE weak Restrictions Weight Bearing Restrictions: No    Mobility  Bed Mobility Overal bed mobility: Needs Assistance Bed Mobility: Supine to Sit     Supine to sit: Min assist;HOB elevated     General bed mobility comments: cues for use of bed rail, pt able to bring LEs off EOB and sit up. Assist to scoot to EOB.  Transfers Overall transfer level: Needs assistance Equipment used: Rolling walker (2 wheeled) Transfers: Sit to/from [facility] Sit to Stand: Min assist Stand pivot transfers: Min assist       General transfer comment: min assist for power up to RW and to  steady in standing. cues to sequence stand step transfer to [facility].  Ambulation/Gait Ambulation/Gait assistance: Min assist Gait Distance (Feet): 6 Feet Assistive device: Rolling walker (2 wheeled) Gait Pattern/deviations: Step-to pattern;Decreased stride length;Decreased step length - right;Decreased step length - left;Trunk flexed Gait velocity: decr   General Gait Details: VC's for step sequencing and assist for walker management to amb small distance from [facility] to recliner.   Stairs             Wheelchair Mobility    Modified Rankin (Stroke Patients Only)       Balance Overall balance assessment: Needs assistance;History of Falls Sitting-balance support: No upper extremity supported;Feet supported Sitting balance-Leahy Scale: Good     Standing balance support: Bilateral upper extremity supported;During functional activity Standing balance-Leahy Scale: Poor Standing balance comment: reliant on upper extremity support                            Cognition Arousal/Alertness: Awake/alert Behavior During Therapy: WFL for tasks assessed/performed Overall Cognitive Status: Within Functional Limits for tasks assessed                                        Exercises      General Comments        Pertinent Vitals/Pain Pain Assessment: No/denies pain Pain Intervention(s): Monitored during session    Home Living  Prior Function            PT Goals (current goals can now be found in the care plan section) Acute Rehab PT Goals PT Goal Formulation: With patient Time For Goal Achievement: 04/24/20 Potential to Achieve Goals: Fair Progress towards PT goals: Progressing toward goals    Frequency    Min 3X/week      PT Plan Current plan remains appropriate    Co-evaluation              AM-PAC PT "6 Clicks" Mobility   Outcome Measure  Help needed turning from your back to your side while in a  flat bed without using bedrails?: A Little Help needed moving from lying on your back to sitting on the side of a flat bed without using bedrails?: A Little Help needed moving to and from a bed to a chair (including a wheelchair)?: A Little Help needed standing up from a chair using your arms (e.g., wheelchair or bedside chair)?: A Little Help needed to walk in hospital room?: A Lot Help needed climbing 3-5 steps with a railing? : Total 6 Click Score: 15    End of Session Equipment Utilized During Treatment: Gait belt;Oxygen Activity Tolerance: Patient tolerated treatment well Patient left: in chair;with call bell/phone within reach;with chair alarm set Nurse Communication: Mobility status PT Visit Diagnosis: Unsteadiness on feet (R26.81);Muscle weakness (generalized) (M62.81);Difficulty in walking, not elsewhere classified (R26.2)     Time: 1130-1156 PT Time Calculation (min) (ACUTE ONLY): 26 min  Charges:  $Therapeutic Activity: 23-37 mins                     Verner Mould, DPT Acute Rehabilitation Services  Office [phone] Pager [phone]  04/17/2020 3:53 PM

## 2020-04-17 NOTE — Discharge Summary (Signed)
Physician Discharge Summary   [name] IDP:[id number] DOB: 06-24-1932 DOA: 04/07/2020  PCP: Binnie Rail, MD  Admit date: 04/07/2020 Discharge date: 04/17/2020  Admitted From: home Disposition:  SNF Discharging physician: Dwyane Dee, MD  Recommendations for Outpatient Follow-up:  1. Follow up with Cardiology after rehab 2. Repeat TSH in 4 weeks 3. Continue abstinence from alcohol   Michelle discharged to SNF in Discharge Condition: stable CODE STATUS: DNR Diet recommendation:  Diet Orders (From admission, onward)    Start     Ordered   04/14/20 0948  DIET DYS 3 Room service appropriate? Yes with Assist; Fluid consistency: Thin  Diet effective now       Question Answer Comment  Room service appropriate? Yes with Assist   Fluid consistency: Thin      04/14/20 0948          Hospital Course: [name] is an 86 year old female with history of COPD on 3 L of oxygen at home, alcohol abuse, hypothyroidism who came to ED with nausea and vomiting.  Michelle had a generalized tonic-clonic seizure in the ED.  She was found to have significant lab abnormalities including hypomagnesemia, magnesium 1.3, hypokalemia potassium 2.7, hyponatremia sodium 119.  BNP was 404, troponin elevated to 3200.   Covid test was negative.  Chest x-ray showed possible infiltrate. She was evaluated by cardiology in setting of NSTEMI; she was started on heparin drip x 48 hours then continued on aspirin. Recommendation is for possible outpatient stress testing pending her clinical improvement and progress with rehab.  For her seizure, she was evaluated by neurology.  Etiology was considered possibly in setting of alcohol withdrawal as well as severe metabolic derangements.  She was not recommended to start any AEDs.  She was evaluated by PT and recommended for SNF at discharge. There were several discussions held with family and Michelle regarding this need and recommendation. I heavily counseled  Michelle on need for rehab as well as abstinence from further etoh use. Also discussed this with her daughter on the phone prior to the Michelle leaving the hospital.      * Non-ST elevation MI (NSTEMI) (HCC)-resolved as of 04/17/2020 - Cardiology was consulted -Per Cardiology, continue ASA 81mg , Crestor 20mg .  -Losartan added by Cardiology -Pt was given heparin x 48hrs, now off -Per Cardiology, pending clinical improvement, can proceed with stress testing in the future as an outpatient. Cardiology has since signed off -Currently remains chest pain free  Alcohol abuse -  Last ETOH is believed to be on 10/17 -Initially monitored on CIWA protocol -Family reports multiple bottles of brandy and other alcoholic beverages were found in the home, currently being discarded by family while pt is in hospital -A P2P was held on 10/28; I also then called and talked with Michelle's daughter, [name], at length after.  Michelle will be accepted to rehab contingent upon increased involvement of family with Michelle regarding her alcohol abuse at home.  She may possibly be too deconditioned and weak for recovering in rehab, but if she has any chance, she must remain abstinent from alcohol going forward.  Family is in agreement and states they will be very involved helping the house remain alcohol free and involved with Michelle with not letting her begin drinking again.  Daughter was very grateful for Michelle still being able to go to rehab and states they will remain involved with positive encouragement to the Michelle with alcohol abstinence from now forward.  Acute combined systolic (congestive) and  diastolic (congestive) heart failure ([place]) -2d echo with EF 45-50% -Recent BNP elevated at 404 -Continue pt on 40mg  IV BID lasix. Diuresing well - transition to PO lasix on 10/28; she remains net negative   Seizure (HCC)-resolved as of 04/15/2020 - likely from alcohol withdrawal versus metabolic abnormalities or  combo -Presenting sodium is 119 -EEG reviewed, unremarkable -Dr. Darrick Meigs discussed with neurology who did not recommend seizure meds -Remains seizure free this hospital visit  Encounter for assessment of decision-making capacity - agreed she has decision making capacity; she remains AOx3 and can explain reasoning behind decisions as well as consequences/risks/benefits  - on 10/27 she was quite convinced that she wanted to go home instead of rehab at discharge however after daughter and husband talked to Michelle she is now at this time amenable to rehab once again   COPD with acute exacerbation ([place]) - continue neb tx as neeed -Air movement and wheezing much improved. Continue with breathing tx as tolerated -Change steroids to p.o. and finish course at discharge    Hypothyroidism Presenting TSH is elevated at 10.57,Michelle is on Synthroid 88 mcg at home.  -Synthroid was recently increased to 100 mcg daily. -Free T4 was within normal limits -Possible element of sick euthyroid -Recommend repeat TSH in 4-6 weeks  Hyponatremia - responded well to treatment - now improved   Chronic respiratory failure with hypoxia (HCC) -On 3 L nasal cannula chronically.  Currently at baseline  Nausea & vomiting-resolved as of 04/15/2020 - likely etoh w/d and metabolic abnormalities  CAP (community acquired pneumonia)-resolved as of 04/15/2020 - Presenting CT chest obtained is negative for PE, however is pos for right lower lobe pneumonia, given presenting nausea/vomiting in the setting of ETOH abuse, consider aspiration PNA -Completed course of rocephin and azithromycin -clinically improved   Principal Diagnosis: Non-ST elevation MI (NSTEMI) [facility])  Discharge Diagnoses: Active Hospital Problems   Diagnosis Date Noted  . Alcohol abuse 04/07/2020    Priority: High  . Acute combined systolic (congestive) and diastolic (congestive) heart failure ([place]) 04/15/2020    Priority: Medium  .  Encounter for assessment of decision-making capacity 04/15/2020    Priority: Low  . COPD with acute exacerbation ([place]) 05/22/2019    Priority: Low  . Hypothyroidism 03/12/2008    Priority: Low  . Hyponatremia 04/07/2020  . Chronic respiratory failure with hypoxia ([place]) 03/07/2015  . Non-small cell carcinoma of lung, stage 1 ([place]) 01/10/2012    Resolved Hospital Problems   Diagnosis Date Noted Date Resolved  . Non-ST elevation MI (NSTEMI) ([place]) 04/07/2020 04/17/2020    Priority: High  . Seizure [facility]) 04/15/2020 04/15/2020    Priority: Medium  . Nausea & vomiting 04/07/2020 04/15/2020  . CAP (community acquired pneumonia) 12/22/2014 04/15/2020    Discharge Instructions    Increase activity slowly   Complete by: As directed      Allergies as of 04/17/2020      Reactions   Ampicillin Diarrhea, Nausea Only   GI upset   Azithromycin Diarrhea, Nausea And Vomiting   Codeine Nausea And Vomiting, Other (See Comments)   Makes her "crazy"   Daliresp [roflumilast] Nausea Only   Nitrofurantoin Diarrhea, Nausea Only   Other Other (See Comments)   No nuts and seeds because of diverticulitis   Penicillins Nausea And Vomiting, Swelling, Rash   Has Michelle had a PCN reaction causing immediate rash, facial/tongue/throat swelling, SOB or lightheadedness with hypotension: Yes Has Michelle had a PCN reaction causing severe rash involving mucus membranes or skin necrosis:  No Has Michelle had a PCN reaction that required hospitalization No Has Michelle had a PCN reaction occurring within the last 10 years: No No "cillins" If all of the above answers are "NO", then may proceed with Cephalosporin use.   Sulfonamide Derivatives Diarrhea, Nausea And Vomiting   Tiotropium Bromide Monohydrate    Urinary retention   Cephalosporins Nausea And Vomiting   IV seems to be ok   Doxycycline Nausea Only   Protonix [pantoprazole] Nausea Only   Fludrocortisone Acetate Itching   Tolerates prednisone   Latex  Itching, Rash      Medication List    STOP taking these medications   atorvastatin 20 MG tablet Commonly known as: LIPITOR   cefdinir 250 MG/5ML suspension Commonly known as: OMNICEF     TAKE these medications   ALPRAZolam 1 MG tablet Commonly known as: XANAX TAKE 1 TABLET BY MOUTH EVERYDAY AT BEDTIME   aspirin 81 MG chewable tablet Chew 1 tablet (81 mg total) by mouth daily.   CENTRUM SILVER PO Take 1 tablet by mouth daily.   Ferrous Fumarate 324 (106 Fe) MG Tabs tablet Commonly known as: HEMOCYTE - 106 mg FE Take 1 tablet by mouth 3 (three) times a week.   Flovent HFA 110 MCG/ACT inhaler Generic drug: fluticasone TAKE 2 PUFFS BY MOUTH TWICE A DAY   Flutter Devi Blow through 4 times per cycle and repeat 3 cycles per day   furosemide 40 MG tablet Commonly known as: LASIX Take 1 tablet (40 mg total) by mouth 2 (two) times daily.   guaiFENesin 600 MG 12 hr tablet Commonly known as: MUCINEX Take 1 tablet (600 mg total) by mouth 2 (two) times daily as needed for to loosen phlegm.   levalbuterol 1.25 MG/3ML nebulizer solution Commonly known as: Xopenex Take 1.25 mg by nebulization every 6 (six) hours as needed for wheezing. DX: Bronchiectasis   levothyroxine 100 MCG tablet Commonly known as: SYNTHROID Take 1 tablet (100 mcg total) by mouth daily before breakfast. What changed:   medication strength  how much to take  when to take this   losartan 25 MG tablet Commonly known as: COZAAR Take 1 tablet (25 mg total) by mouth daily.   ondansetron 4 MG tablet Commonly known as: ZOFRAN Take 1 tablet (4 mg total) by mouth 3 (three) times daily as needed for nausea or vomiting.   predniSONE 20 MG tablet Commonly known as: DELTASONE Take 2 tablets (40 mg total) by mouth daily with breakfast for 2 days. Start taking on: April 18, 2020   PROBIOTIC PO Take 1 capsule by mouth daily.   rosuvastatin 20 MG tablet Commonly known as: CRESTOR Take 1 tablet (20 mg  total) by mouth daily.   Vitamin D 50 MCG (2000 UT) tablet Take 1 tablet (2,000 Units total) by mouth daily.       Contact information for after-discharge care    Destination    [facility] Preferred SNF .   Service: Skilled Nursing Contact information: [place] [phone]                 Allergies  Allergen Reactions  . Ampicillin Diarrhea and Nausea Only    GI upset  . Azithromycin Diarrhea and Nausea And Vomiting  . Codeine Nausea And Vomiting and Other (See Comments)    Makes her "crazy"  . Daliresp [Roflumilast] Nausea Only  . Nitrofurantoin Diarrhea and Nausea Only  . Other Other (See Comments)  No nuts and seeds because of diverticulitis  . Penicillins Nausea And Vomiting, Swelling and Rash    Has Michelle had a PCN reaction causing immediate rash, facial/tongue/throat swelling, SOB or lightheadedness with hypotension: Yes Has Michelle had a PCN reaction causing severe rash involving mucus membranes or skin necrosis: No Has Michelle had a PCN reaction that required hospitalization No Has Michelle had a PCN reaction occurring within the last 10 years: No No "cillins" If all of the above answers are "NO", then may proceed with Cephalosporin use.   . Sulfonamide Derivatives Diarrhea and Nausea And Vomiting  . Tiotropium Bromide Monohydrate     Urinary retention  . Cephalosporins Nausea And Vomiting    IV seems to be ok  . Doxycycline Nausea Only  . Protonix [Pantoprazole] Nausea Only  . Fludrocortisone Acetate Itching    Tolerates prednisone  . Latex Itching and Rash    Consultations: Cardiology  Discharge Exam: BP 125/69 (BP Location: Left Arm)   Pulse 83   Temp 98 F (36.7 C) (Oral)   Resp 19   Ht 5\' 6"  (1.676 m)   Wt 66.5 kg   SpO2 95%   BMI 23.66 kg/m  General appearance: alert, cooperative and no distress Head: Normocephalic, without obvious abnormality, atraumatic Eyes:  EOMI Lungs: coarse breath sounds bilaterally Heart: regular rate and rhythm and S1, S2 normal Abdomen: soft, ND, NT, BS present Extremities: no edema Skin: mobility and turgor normal Neurologic: Grossly normal  The results of significant diagnostics from this hospitalization (including imaging, microbiology, ancillary and laboratory) are listed below for reference.   Microbiology: Recent Results (from the past 240 hour(s))  MRSA PCR Screening     Status: None   Collection Time: 04/07/20  5:29 PM   Specimen: Nasal Mucosa; Nasopharyngeal  Result Value Ref Range Status   MRSA by PCR NEGATIVE NEGATIVE Final    Comment:        The GeneXpert MRSA Assay (FDA approved for NASAL specimens only), is one component of a comprehensive MRSA colonization surveillance program. It is not intended to diagnose MRSA infection nor to guide or monitor treatment for MRSA infections. Performed at [facility], [place] [address] [place], [place] [address]   SARS Coronavirus 2 by RT PCR (hospital order, performed in [facility] hospital lab) Nasopharyngeal Nasopharyngeal Swab     Status: None   Collection Time: 04/16/20 11:00 PM   Specimen: Nasopharyngeal Swab  Result Value Ref Range Status   SARS Coronavirus 2 NEGATIVE NEGATIVE Final    Comment: (NOTE) SARS-CoV-2 target nucleic acids are NOT DETECTED.  The SARS-CoV-2 RNA is generally detectable in upper and lower respiratory specimens during the acute phase of infection. The lowest concentration of SARS-CoV-2 viral copies this assay can detect is 250 copies / mL. A negative result does not preclude SARS-CoV-2 infection and should not be used as the sole basis for treatment or other Michelle management decisions.  A negative result may occur with improper specimen collection / handling, submission of specimen other than nasopharyngeal swab, presence of viral mutation(s) within the areas targeted by this assay, and inadequate number  of viral copies (<250 copies / mL). A negative result must be combined with clinical observations, Michelle history, and epidemiological information.  Fact Sheet for Patients:   StrictlyIdeas.no  Fact Sheet for Healthcare Providers: BankingDealers.co.za  This test is not yet approved or  cleared by the [place] FDA and has been authorized for detection and/or diagnosis of SARS-CoV-2 by FDA  under an Emergency Use Authorization (EUA).  This EUA will remain in effect (meaning this test can be used) for the duration of the COVID-19 declaration under Section 564(b)(1) of the Act, 21 U.S.C. section 360bbb-3(b)(1), unless the authorization is terminated or revoked sooner.  Performed at [facility] [place] [address], [place], [place] [address]      Labs: BNP (last 3 results) Recent Labs    04/07/20 0320  BNP 267.1*   Basic Metabolic Panel: Recent Labs  Lab 04/11/20 0246 04/12/20 0537 04/13/20 0525 04/14/20 0503 04/15/20 0439 04/16/20 0403 04/17/20 0419  NA 123*   < > 133* 132* 132* 133* 132*  K 4.3   < > 3.2* 3.8 3.6 3.3* 3.5  CL 94*   < > 89* 84* 82* 82* 81*  CO2 23   < > 37* 40* 41* 43* 42*  GLUCOSE 116*   < > 122* 123* 115* 92 93  BUN 7*   < > 8 11 14 14 14   CREATININE 0.61   < > 0.83 0.86 0.78 0.79 0.79  CALCIUM 8.1*   < > 8.4* 8.5* 8.7* 8.7* 8.5*  MG 1.9  --  2.1  --   --  2.0 2.1   < > = values in this interval not displayed.   Liver Function Tests: Recent Labs  Lab 04/13/20 0525 04/14/20 0503 04/15/20 0439 04/16/20 0403 04/17/20 0419  AST 55* 67* 54* 42* 43*  ALT 39 48* 46* 38 39  ALKPHOS 106 106 103 99 106  BILITOT 0.6 0.7 0.8 0.8 0.7  PROT 5.3* 5.4* 5.3* 5.1* 5.1*  ALBUMIN 2.3* 2.4* 2.5* 2.2* 2.1*   No results for input(s): LIPASE, AMYLASE in the last 168 hours. No results for input(s): AMMONIA in the last 168 hours. CBC: Recent Labs  Lab 04/12/20 0537 04/13/20 0525  04/15/20 0439 04/16/20 0403 04/17/20 0419  WBC 5.7 7.7 10.1 9.3 7.5  NEUTROABS  --   --   --  7.5 5.8  HGB 8.6* 9.3* 9.9* 9.9* 9.6*  HCT 26.8* 28.8* 31.4* 31.0* 29.4*  MCV 100.4* 99.3 100.0 99.4 97.7  PLT 160 165 186 181 161   Cardiac Enzymes: No results for input(s): CKTOTAL, CKMB, CKMBINDEX, TROPONINI in the last 168 hours. BNP: Invalid input(s): POCBNP CBG: No results for input(s): GLUCAP in the last 168 hours. D-Dimer No results for input(s): DDIMER in the last 72 hours. Hgb A1c No results for input(s): HGBA1C in the last 72 hours. Lipid Profile No results for input(s): CHOL, HDL, LDLCALC, TRIG, CHOLHDL, LDLDIRECT in the last 72 hours. Thyroid function studies No results for input(s): TSH, T4TOTAL, T3FREE, THYROIDAB in the last 72 hours.  Invalid input(s): FREET3 Anemia work up No results for input(s): VITAMINB12, FOLATE, FERRITIN, TIBC, IRON, RETICCTPCT in the last 72 hours. Urinalysis    Component Value Date/Time   COLORURINE STRAW (A) 11/26/2019 2130   APPEARANCEUR CLEAR 11/26/2019 2130   LABSPEC 1.004 (L) 11/26/2019 2130   PHURINE 6.0 11/26/2019 2130   GLUCOSEU NEGATIVE 11/26/2019 2130   GLUCOSEU NEGATIVE 05/08/2018 1657   HGBUR NEGATIVE 11/26/2019 2130   HGBUR negative 07/30/2009 1254   BILIRUBINUR NEGATIVE 11/26/2019 2130   BILIRUBINUR neg 10/10/2012 1334   KETONESUR NEGATIVE 11/26/2019 2130   PROTEINUR NEGATIVE 11/26/2019 2130   UROBILINOGEN 0.2 05/08/2018 1657   NITRITE NEGATIVE 11/26/2019 2130   LEUKOCYTESUR NEGATIVE 11/26/2019 2130   Sepsis Labs Invalid input(s): PROCALCITONIN,  WBC,  LACTICIDVEN Microbiology Recent Results (from the past 240 hour(s))  MRSA PCR Screening     Status: None   Collection Time: 04/07/20  5:29 PM   Specimen: Nasal Mucosa; Nasopharyngeal  Result Value Ref Range Status   MRSA by PCR NEGATIVE NEGATIVE Final    Comment:        The GeneXpert MRSA Assay (FDA approved for NASAL specimens only), is one component of  a comprehensive MRSA colonization surveillance program. It is not intended to diagnose MRSA infection nor to guide or monitor treatment for MRSA infections. Performed at [facility], [place] [address], [place], [place] [address]   SARS Coronavirus 2 by RT PCR (hospital order, performed in [facility] hospital lab) Nasopharyngeal Nasopharyngeal Swab     Status: None   Collection Time: 04/16/20 11:00 PM   Specimen: Nasopharyngeal Swab  Result Value Ref Range Status   SARS Coronavirus 2 NEGATIVE NEGATIVE Final    Comment: (NOTE) SARS-CoV-2 target nucleic acids are NOT DETECTED.  The SARS-CoV-2 RNA is generally detectable in upper and lower respiratory specimens during the acute phase of infection. The lowest concentration of SARS-CoV-2 viral copies this assay can detect is 250 copies / mL. A negative result does not preclude SARS-CoV-2 infection and should not be used as the sole basis for treatment or other Michelle management decisions.  A negative result may occur with improper specimen collection / handling, submission of specimen other than nasopharyngeal swab, presence of viral mutation(s) within the areas targeted by this assay, and inadequate number of viral copies (<250 copies / mL). A negative result must be combined with clinical observations, Michelle history, and epidemiological information.  Fact Sheet for Patients:   StrictlyIdeas.no  Fact Sheet for Healthcare Providers: BankingDealers.co.za  This test is not yet approved or  cleared by the [place] FDA and has been authorized for detection and/or diagnosis of SARS-CoV-2 by FDA under an Emergency Use Authorization (EUA).  This EUA will remain in effect (meaning this test can be used) for the duration of the COVID-19 declaration under Section 564(b)(1) of the Act, 21 U.S.C. section 360bbb-3(b)(1), unless the authorization is terminated or revoked  sooner.  Performed at [facility], [place] [address], [place], [place] [address]     Procedures/Studies: EEG  Result Date: 04/07/2020 Lora Havens, MD     04/07/2020  5:21 PM [name] MRN: [id number] Epilepsy Attending: Lora Havens Referring Physician/Provider: Dr Gean Birchwood Date: 04/07/2020 Duration: 24.05 mins Michelle history: 86yo F with seizures in setting of hyponatremia. EEG to evaluate for seizure Level of alertness: Awake AEDs during EEG study: None Technical aspects: This EEG study was done with scalp electrodes positioned according to the 10-20 International system of electrode placement. Electrical activity was acquired at a sampling rate of 500Hz  and reviewed with a high frequency filter of 70Hz  and a low frequency filter of 1Hz . EEG data were recorded continuously and digitally stored. Description: The posterior dominant rhythm consists of 9-10 Hz activity of moderate voltage (25-35 uV) seen predominantly in posterior head regions, symmetric and reactive to eye opening and eye closing. There is also an excessive amount of 15 to 18 Hz beta activity distributed symmetrically and diffusely.  Hyperventilation and photic stimulation were not performed.   ABNORMALITY -Excessive beta, generalized IMPRESSION: This study is within normal limits. No seizures or epileptiform discharges were seen throughout the recording. The excessive beta activity seen in the background is most likely due to the effect of benzodiazepine and is a benign EEG pattern. Priyanka  Barbra Sarks   DG Chest 2 View  Result Date: 04/07/2020 CLINICAL DATA:  86 year old female with shortness of breath.  COPD. EXAM: CHEST - 2 VIEW COMPARISON:  03/12/2020 chest radiographs and earlier. FINDINGS: Upright AP and lateral views. 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 Right lung base calcified pleural plaques demonstrated by CT in  2018. Mediastinal contours are stable. No pneumothorax. But there is increased reticulonodular opacity in the right lower lung compared to March of this year. Left lung appears stable. Calcified aortic atherosclerosis. Osteopenia. No acute osseous abnormality identified. Paucity of bowel gas in the upper abdomen. IMPRESSION: 1. Chronic lung disease with increased reticulonodular opacity in the right lower lung compatible with acute infectious exacerbation. 2. Aortic Atherosclerosis (ICD10-I70.0) and Emphysema (ICD10-J43.9). Electronically Signed   By: Genevie Ann M.D.   On: 04/07/2020 03:05   CT Head Wo Contrast  Result Date: 04/07/2020 CLINICAL DATA:  Nontraumatic seizure EXAM: CT HEAD WITHOUT CONTRAST TECHNIQUE: Contiguous axial images were obtained from the base of the skull through the vertex without intravenous contrast. COMPARISON:  01/08/2020 FINDINGS: Brain: No evidence of acute infarction, hemorrhage, hydrocephalus, extra-axial collection or mass lesion/mass effect. Generalized atrophy with chronic small vessel ischemic low-density in the deep white matter. Vascular: No hyperdense vessel or unexpected calcification. Skull: Normal. Negative for fracture or focal lesion. Sinuses/Orbits: No acute finding.  Bilateral cataract resection. Other: Motion degraded. IMPRESSION: 1. Aging brain without acute superimposed finding. 2. Intermittent motion artifact. Electronically Signed   By: Monte Fantasia M.D.   On: 04/07/2020 04:23   CT Angio Chest PE W and/or Wo Contrast  Result Date: 04/07/2020 CLINICAL DATA:  Positive D-dimer.  Can not catch breath EXAM: CT ANGIOGRAPHY CHEST WITH CONTRAST TECHNIQUE: Multidetector CT imaging of the chest was performed using the standard protocol during bolus administration of intravenous contrast. Multiplanar CT image reconstructions and MIPs were obtained to evaluate the vascular anatomy. CONTRAST:  169mL OMNIPAQUE IOHEXOL 350 MG/ML SOLN COMPARISON:  02/28/2017 FINDINGS:  Cardiovascular: Normal heart size. No pericardial effusion. Advanced atheromatous calcification of the aorta and coronaries. The apical chest was excluded from the field of view on the CTA phase but was covered on a subsequent phase. Bulky calcification causes high-grade narrowing at the left subclavian artery. Mediastinum/Nodes: Negative for adenopathy or mass. Lungs/Pleura: Emphysema and airway thickening. Chronic bronchial at the right lower lobe after prior surgery. There is pleural based calcification and volume loss at the right lower lobe. Subpleural opacity in the right lower lobe not seen in 2018. Bilateral interlobular septal thickening. Upper Abdomen: Reported separately Musculoskeletal: No acute finding. Review of the MIP images confirms the above findings. IMPRESSION: 1. No evidence of pulmonary embolism. 2. Right lower lobe pneumonia or atelectasis. 3. Borderline interstitial edema 4. Advanced COPD with asymmetric right-sided scarring where there has been prior lung cancer resection. 5. Advanced atherosclerosis. Aortic Atherosclerosis (ICD10-I70.0) and Emphysema (ICD10-J43.9). Electronically Signed   By: Monte Fantasia M.D.   On: 04/07/2020 07:08   CT ABDOMEN PELVIS W CONTRAST  Result Date: 04/07/2020 CLINICAL DATA:  Epigastric pain EXAM: CT ABDOMEN AND PELVIS WITH CONTRAST TECHNIQUE: Multidetector CT imaging of the abdomen and pelvis was performed using the standard protocol following bolus administration of intravenous contrast. CONTRAST:  147mL OMNIPAQUE IOHEXOL 350 MG/ML SOLN COMPARISON:  01/01/2020 FINDINGS: Lower chest:  Reported on dedicated chest CT. Hepatobiliary: Possible steatosis of the liver.At least 1 calcified gallstone. No findings of cholecystitis. No bile duct dilatation. Pancreas: Unremarkable. Spleen: Unremarkable. Adrenals/Urinary  Tract: Negative adrenals. No hydronephrosis or stone. Unremarkable bladder. Stomach/Bowel: Rectocele appearance. No bowel obstruction or visible  inflammation. Numerous left colonic diverticula. Negative appendix. Vascular/Lymphatic: Diffuse atheromatous calcification with multiple narrowed visceral arteries, not well assessed on this study. No mass or adenopathy. Reproductive:Hysterectomy.  Pelvic floor laxity Other: Loculated fluid in the low left pelvis, likely peritoneal inclusion cyst given stability from prior. Musculoskeletal: Remarkably severe right hip osteoarthritis with femoral head collapse and fragmentation. Generalized osteopenia and advanced spinal degeneration. IMPRESSION: 1. Motion degraded CT without acute finding. 2. Numerous chronic findings are stable from July 2021 and described above. Electronically Signed   By: Monte Fantasia M.D.   On: 04/07/2020 07:20   DG Swallowing Func-Speech Pathology  Result Date: 04/11/2020 Objective Swallowing Evaluation: Type of Study: MBS-Modified Barium Swallow Study  Michelle Details Name: RELDA Esparza MRN: [id number] Date of Birth: 11-30-1932 Today's Date: 04/11/2020 Time: SLP Start Time (ACUTE ONLY): 1450 -SLP Stop Time (ACUTE ONLY): 1510 SLP Time Calculation (min) (ACUTE ONLY): 20 min Past Medical History: Past Medical History: Diagnosis Date . Allergic rhinitis  . Anxiety  . Arthritis   "back, hands; hips" (12/22/2014) . Asthma  . Bladder atony  . Chronic bronchitis ([place])  . COPD (chronic obstructive pulmonary disease) ([place])  . Diarrhea  . Emphysema of lung ([place])  . GERD (gastroesophageal reflux disease)   "w/spicey foods" (12/22/2014) . Hyperlipidemia  . Non-small cell carcinoma of lung, stage 1 ([place]) 2005  1.4 cm Poorly differentiated Squamous cell RUL  T1N0 resected 09/22/2003 . On home oxygen therapy   "3L q night and prn during the day" (12/22/2014) . Pelvic cyst  . Pneumonia   "this is the 3rd time that I can remember" (12/22/2014) . Unspecified hypothyroidism  Past Surgical History: Past Surgical History: Procedure Laterality Date . ABDOMINAL HYSTERECTOMY  ~ 1976 . BACK SURGERY   . CATARACT  EXTRACTION W/ INTRAOCULAR LENS  IMPLANT, BILATERAL Bilateral ~ 2012 . DILATION AND CURETTAGE OF UTERUS   . LUMBAR DISC SURGERY  1970's . LYMPH NODE DISSECTION  2005  "all my lymph nodes under breasts, went thru my back; Dr. Arlyce Dice did it" . Needle aspiration Pelvic cyst  2011  Dr Diona Fanti . VIDEO ASSISTED THORACOSCOPY (VATS)/THOROCOTOMY Right 09/2003  thoracotomy, right upper lobectomy with node dissection; Dr Demetrios Loll 11/02/2010 . VIDEO BRONCHOSCOPY  03/2004  Archie Endo 11/02/2010 HPI: 86 yo female adm [facility] with AMS, COPD exacerbation and n/v, hospital coarse complicated by seizure.  Pt CT abdomen showed right LL pna.  CT head s howed againg brain without acute changes.  Pt is on 3 liters of oxygen at home at baseline.   Pt with H/O ETOH use, lung cancer s/p partial lobectomy, COPD.   She is currently on heparain for NSTEMI.  Pt admits to h/o reflux and frequent belching for which she took a PPI for a period of time. Pt is essentially chair bound at home within the last few months due to frequent falls in the last few months.  Subjective: pleasant, alert, daughter present prior to and after MBS for education Assessment / Plan / Recommendation CHL IP CLINICAL IMPRESSIONS 04/11/2020 Clinical Impression Michelle presents with a mild oral and a mild-mod pharyngeal dysphagia without aspiration of any tested consistencies (thin, puree, regular solids, barium tablet). She exhibited flash penetration during the swallow with thin liquids via straw sips, mixed consistencies (sips of liquids during regular solids intake) but full clearance of penetrate observed each time. Michelle with swallow initiation delay at level of  vallecular sinus with thin liquids and exhbiited trace to minimal vallecular sinus residuals with puree solids, regular solids and trace residuals with thin liquids which fully cleared after subsequent, uncued swallows. Presence of two cervical osteophytes (no radiologist to confirm) impeding into pharynx but  not significantly impacting pharyngeal phase of swallow. Aspiration risk related to dysphagia is in mild range however her COPD/respiratory issues do put her at a higher risk and Michelle is advised to monitor respiratory rate and oxygen saturations and take rest breaks when eating. SLP Visit Diagnosis Dysphagia, oropharyngeal phase (R13.12) Attention and concentration deficit following -- Frontal lobe and executive function deficit following -- Impact on safety and function Mild aspiration risk   CHL IP TREATMENT RECOMMENDATION 04/11/2020 Treatment Recommendations Therapy as outlined in treatment plan below   Prognosis 04/11/2020 Prognosis for Safe Diet Advancement Good Barriers to Reach Goals -- Barriers/Prognosis Comment -- CHL IP DIET RECOMMENDATION 04/11/2020 SLP Diet Recommendations Dysphagia 3 (Mech soft) solids;Thin liquid Liquid Administration via Cup;Straw Medication Administration Whole meds with liquid Compensations Minimize environmental distractions;Small sips/bites;Slow rate;Other (Comment) Postural Changes --   CHL IP OTHER RECOMMENDATIONS 04/11/2020 Recommended Consults -- Oral Care Recommendations Oral care BID Other Recommendations --   CHL IP FOLLOW UP RECOMMENDATIONS 04/11/2020 Follow up Recommendations None   CHL IP FREQUENCY AND DURATION 04/11/2020 Speech Therapy Frequency (ACUTE ONLY) min 1 x/week Treatment Duration 1 week      CHL IP ORAL PHASE 04/11/2020 Oral Phase Impaired Oral - Pudding Teaspoon -- Oral - Pudding Cup -- Oral - Honey Teaspoon -- Oral - Honey Cup -- Oral - Nectar Teaspoon -- Oral - Nectar Cup -- Oral - Nectar Straw -- Oral - Thin Teaspoon -- Oral - Thin Cup -- Oral - Thin Straw -- Oral - Puree -- Oral - Mech Soft -- Oral - Regular Reduced posterior propulsion Oral - Multi-Consistency -- Oral - Pill -- Oral Phase - Comment --  CHL IP PHARYNGEAL PHASE 04/11/2020 Pharyngeal Phase Impaired Pharyngeal- Pudding Teaspoon -- Pharyngeal -- Pharyngeal- Pudding Cup -- Pharyngeal --  Pharyngeal- Honey Teaspoon -- Pharyngeal -- Pharyngeal- Honey Cup -- Pharyngeal -- Pharyngeal- Nectar Teaspoon -- Pharyngeal -- Pharyngeal- Nectar Cup -- Pharyngeal -- Pharyngeal- Nectar Straw -- Pharyngeal -- Pharyngeal- Thin Teaspoon -- Pharyngeal -- Pharyngeal- Thin Cup Delayed swallow initiation-vallecula;Penetration/Aspiration during swallow Pharyngeal Material enters airway, remains ABOVE vocal cords then ejected out Pharyngeal- Thin Straw Delayed swallow initiation-vallecula;Penetration/Aspiration during swallow Pharyngeal Material enters airway, remains ABOVE vocal cords then ejected out Pharyngeal- Puree WFL Pharyngeal -- Pharyngeal- Mechanical Soft -- Pharyngeal -- Pharyngeal- Regular WFL Pharyngeal -- Pharyngeal- Multi-consistency Penetration/Aspiration during swallow Pharyngeal Material enters airway, remains ABOVE vocal cords then ejected out Pharyngeal- Pill WFL Pharyngeal -- Pharyngeal Comment --  CHL IP CERVICAL ESOPHAGEAL PHASE 04/11/2020 Cervical Esophageal Phase (No Data) Pudding Teaspoon -- Pudding Cup -- Honey Teaspoon -- Honey Cup -- Nectar Teaspoon -- Nectar Cup -- Nectar Straw -- Thin Teaspoon -- Thin Cup -- Thin Straw -- Puree -- Mechanical Soft -- Regular -- Multi-consistency -- Pill -- Cervical Esophageal Comment -- Sonia Baller, MA, CCC-SLP Speech Therapy             ECHOCARDIOGRAM COMPLETE  Result Date: 04/07/2020    ECHOCARDIOGRAM REPORT   Michelle Name:   JALINA Esparza Date of Exam: 04/07/2020 Medical Rec #:  [id number]         Height:       66.0 in Accession #:    [id number]        Weight:  133.2 lb Date of Birth:  July 21, 1932        BSA:          1.682 m Michelle Age:    28 years          BP:           109/70 mmHg Michelle Gender: F                 HR:           97 bpm. Exam Location:  Inpatient Procedure: 2D Echo, Cardiac Doppler, Color Doppler and Intracardiac            Opacification Agent Indications:    R07.89 Other chest pain. Elevated troponin.  History:         Michelle has prior history of Echocardiogram examinations, most                 recent 11/28/2017. COPD, Signs/Symptoms:Chest Pain, Shortness of                 Breath and Dyspnea; Risk Factors:Former Smoker and Dyslipidemia.                 Elevated troponin. Hypoxia. Lung cancer. ETOH.  Sonographer:    Roseanna Rainbow RDCS Referring Phys: [place]  Sonographer Comments: Technically challenging study due to limited acoustic windows, Technically difficult study due to poor echo windows, suboptimal parasternal window, suboptimal apical window and suboptimal subcostal window. Image acquisition challenging due to COPD and Image acquisition challenging due to respiratory motion. Michelle having trouble breathing throughout exam. Extremely difficult and limited study. IMPRESSIONS  1. Left ventricular ejection fraction, by estimation, is 45 to 50%. The left ventricle has mildly decreased function. The left ventricle demonstrates global hypokinesis. Left ventricular diastolic parameters are consistent with Grade I diastolic dysfunction (impaired relaxation).  2. Right ventricular systolic function was not well visualized. The right ventricular size is not well visualized.  3. The mitral valve was not well visualized. No evidence of mitral valve regurgitation.  4. The aortic valve was not well visualized. Aortic valve regurgitation is not visualized.  5. The inferior vena cava is dilated in size with <50% respiratory variability, suggesting right atrial pressure of 15 mmHg. Comparison(s): No prior Echocardiogram. Conclusion(s)/Recommendation(s): Difficult study even with echo constrast. FINDINGS  Left Ventricle: Left ventricular ejection fraction, by estimation, is 45 to 50%. The left ventricle has mildly decreased function. The left ventricle demonstrates global hypokinesis. Definity contrast agent was given IV to delineate the left ventricular  endocardial borders. The left ventricular internal cavity size was  normal in size. There is no left ventricular hypertrophy. Left ventricular diastolic parameters are consistent with Grade I diastolic dysfunction (impaired relaxation). Right Ventricle: The right ventricular size is not well visualized. Right vetricular wall thickness was not well visualized. Right ventricular systolic function was not well visualized. Left Atrium: Left atrial size was normal in size. Right Atrium: Right atrial size was not well visualized. Pericardium: There is no evidence of pericardial effusion. Mitral Valve: The mitral valve was not well visualized. No evidence of mitral valve regurgitation. Tricuspid Valve: The tricuspid valve is not well visualized. Tricuspid valve regurgitation is not demonstrated. Aortic Valve: The aortic valve was not well visualized. Aortic valve regurgitation is not visualized. Pulmonic Valve: The pulmonic valve was not well visualized. Pulmonic valve regurgitation is not visualized. Aorta: The aortic root is normal in size and structure and the ascending aorta was not well visualized. Venous: The  pulmonary veins were not well visualized. The inferior vena cava is dilated in size with less than 50% respiratory variability, suggesting right atrial pressure of 15 mmHg. IAS/Shunts: The interatrial septum was not well visualized.  LEFT VENTRICLE PLAX 2D LVIDd:         4.40 cm     Diastology LVIDs:         3.50 cm     LV e' medial:    5.55 cm/s LV PW:         0.90 cm     LV E/e' medial:  13.1 LV IVS:        1.10 cm     LV e' lateral:   9.57 cm/s LVOT diam:     1.70 cm     LV E/e' lateral: 7.6 LV SV:         23 LV SV Index:   14 LVOT Area:     2.27 cm  LV Volumes (MOD) LV vol d, MOD A2C: 63.9 ml LV vol d, MOD A4C: 91.8 ml LV vol s, MOD A2C: 30.3 ml LV vol s, MOD A4C: 49.8 ml LV SV MOD A2C:     33.7 ml LV SV MOD A4C:     91.8 ml LV SV MOD BP:      38.6 ml IVC IVC diam: 2.40 cm LEFT ATRIUM           Index LA diam:      3.10 cm 1.84 cm/m LA Vol (A4C): 33.6 ml 19.97 ml/m  AORTIC  VALVE LVOT Vmax:   56.40 cm/s LVOT Vmean:  35.800 cm/s LVOT VTI:    0.103 m  AORTA Ao Root diam: 3.20 cm MITRAL VALVE MV Area (PHT): 4.06 cm    SHUNTS MV Decel Time: 187 msec    Systemic VTI:  0.10 m MV E velocity: 72.80 cm/s  Systemic Diam: 1.70 cm MV A velocity: 73.70 cm/s MV E/A ratio:  0.99 Rudean Haskell MD Electronically signed by Rudean Haskell MD Signature Date/Time: 04/07/2020/2:46:36 PM    Final      Time coordinating discharge: Over 30 minutes    Dwyane Dee, MD  [facility] 04/17/2020, 10:46 AM

## 2020-04-17 NOTE — Plan of Care (Signed)

## 2020-04-17 NOTE — Progress Notes (Signed)
Patient picked up by [facility] for discharge.  Patient stable at time of discharge.  Dr. Sabino Gasser notified of patient's temp - may proceed with discharge.

## 2020-04-17 NOTE — Progress Notes (Signed)
Report given to [facility] at [place].  Patient to be discharged this afternoon.

## 2020-04-20 ENCOUNTER — Inpatient Hospital Stay (HOSPITAL_COMMUNITY): Payer: PPO

## 2020-04-20 ENCOUNTER — Other Ambulatory Visit: Payer: Self-pay

## 2020-04-20 ENCOUNTER — Encounter (HOSPITAL_COMMUNITY): Payer: Self-pay | Admitting: Emergency Medicine

## 2020-04-20 ENCOUNTER — Inpatient Hospital Stay (HOSPITAL_COMMUNITY)
Admission: EM | Admit: 2020-04-20 | Discharge: 2020-05-20 | DRG: 871 | Disposition: E | Payer: PPO | Attending: Internal Medicine | Admitting: Internal Medicine

## 2020-04-20 ENCOUNTER — Emergency Department (HOSPITAL_COMMUNITY): Payer: PPO

## 2020-04-20 DIAGNOSIS — R079 Chest pain, unspecified: Secondary | ICD-10-CM | POA: Diagnosis not present

## 2020-04-20 DIAGNOSIS — Z902 Acquired absence of lung [part of]: Secondary | ICD-10-CM | POA: Diagnosis not present

## 2020-04-20 DIAGNOSIS — S73001A Unspecified subluxation of right hip, initial encounter: Secondary | ICD-10-CM | POA: Diagnosis not present

## 2020-04-20 DIAGNOSIS — N179 Acute kidney failure, unspecified: Secondary | ICD-10-CM | POA: Diagnosis not present

## 2020-04-20 DIAGNOSIS — J439 Emphysema, unspecified: Secondary | ICD-10-CM | POA: Diagnosis not present

## 2020-04-20 DIAGNOSIS — J9621 Acute and chronic respiratory failure with hypoxia: Secondary | ICD-10-CM | POA: Diagnosis present

## 2020-04-20 DIAGNOSIS — E876 Hypokalemia: Secondary | ICD-10-CM | POA: Diagnosis present

## 2020-04-20 DIAGNOSIS — J441 Chronic obstructive pulmonary disease with (acute) exacerbation: Secondary | ICD-10-CM | POA: Diagnosis present

## 2020-04-20 DIAGNOSIS — Z87891 Personal history of nicotine dependence: Secondary | ICD-10-CM

## 2020-04-20 DIAGNOSIS — R109 Unspecified abdominal pain: Secondary | ICD-10-CM

## 2020-04-20 DIAGNOSIS — Z515 Encounter for palliative care: Secondary | ICD-10-CM | POA: Diagnosis not present

## 2020-04-20 DIAGNOSIS — R1032 Left lower quadrant pain: Secondary | ICD-10-CM | POA: Diagnosis present

## 2020-04-20 DIAGNOSIS — A419 Sepsis, unspecified organism: Secondary | ICD-10-CM | POA: Diagnosis not present

## 2020-04-20 DIAGNOSIS — Z7952 Long term (current) use of systemic steroids: Secondary | ICD-10-CM

## 2020-04-20 DIAGNOSIS — K6389 Other specified diseases of intestine: Secondary | ICD-10-CM | POA: Diagnosis not present

## 2020-04-20 DIAGNOSIS — R6520 Severe sepsis without septic shock: Secondary | ICD-10-CM | POA: Diagnosis not present

## 2020-04-20 DIAGNOSIS — R0902 Hypoxemia: Secondary | ICD-10-CM | POA: Diagnosis not present

## 2020-04-20 DIAGNOSIS — E039 Hypothyroidism, unspecified: Secondary | ICD-10-CM | POA: Diagnosis not present

## 2020-04-20 DIAGNOSIS — Z85118 Personal history of other malignant neoplasm of bronchus and lung: Secondary | ICD-10-CM

## 2020-04-20 DIAGNOSIS — Z79890 Hormone replacement therapy (postmenopausal): Secondary | ICD-10-CM | POA: Diagnosis not present

## 2020-04-20 DIAGNOSIS — R531 Weakness: Secondary | ICD-10-CM | POA: Diagnosis not present

## 2020-04-20 DIAGNOSIS — I110 Hypertensive heart disease with heart failure: Secondary | ICD-10-CM | POA: Diagnosis present

## 2020-04-20 DIAGNOSIS — Z79899 Other long term (current) drug therapy: Secondary | ICD-10-CM

## 2020-04-20 DIAGNOSIS — R0600 Dyspnea, unspecified: Secondary | ICD-10-CM

## 2020-04-20 DIAGNOSIS — Z7982 Long term (current) use of aspirin: Secondary | ICD-10-CM

## 2020-04-20 DIAGNOSIS — Z20822 Contact with and (suspected) exposure to covid-19: Secondary | ICD-10-CM | POA: Diagnosis not present

## 2020-04-20 DIAGNOSIS — I5043 Acute on chronic combined systolic (congestive) and diastolic (congestive) heart failure: Secondary | ICD-10-CM | POA: Diagnosis not present

## 2020-04-20 DIAGNOSIS — J189 Pneumonia, unspecified organism: Secondary | ICD-10-CM | POA: Diagnosis not present

## 2020-04-20 DIAGNOSIS — J9811 Atelectasis: Secondary | ICD-10-CM | POA: Diagnosis not present

## 2020-04-20 DIAGNOSIS — I5042 Chronic combined systolic (congestive) and diastolic (congestive) heart failure: Secondary | ICD-10-CM | POA: Diagnosis not present

## 2020-04-20 DIAGNOSIS — I10 Essential (primary) hypertension: Secondary | ICD-10-CM | POA: Diagnosis not present

## 2020-04-20 DIAGNOSIS — J90 Pleural effusion, not elsewhere classified: Secondary | ICD-10-CM | POA: Diagnosis not present

## 2020-04-20 DIAGNOSIS — I214 Non-ST elevation (NSTEMI) myocardial infarction: Secondary | ICD-10-CM | POA: Diagnosis not present

## 2020-04-20 DIAGNOSIS — E785 Hyperlipidemia, unspecified: Secondary | ICD-10-CM | POA: Diagnosis present

## 2020-04-20 DIAGNOSIS — Z9981 Dependence on supplemental oxygen: Secondary | ICD-10-CM | POA: Diagnosis not present

## 2020-04-20 DIAGNOSIS — Z90710 Acquired absence of both cervix and uterus: Secondary | ICD-10-CM

## 2020-04-20 DIAGNOSIS — J9611 Chronic respiratory failure with hypoxia: Secondary | ICD-10-CM | POA: Diagnosis not present

## 2020-04-20 DIAGNOSIS — I959 Hypotension, unspecified: Secondary | ICD-10-CM | POA: Diagnosis not present

## 2020-04-20 DIAGNOSIS — K219 Gastro-esophageal reflux disease without esophagitis: Secondary | ICD-10-CM | POA: Diagnosis not present

## 2020-04-20 DIAGNOSIS — R0602 Shortness of breath: Secondary | ICD-10-CM

## 2020-04-20 DIAGNOSIS — K5732 Diverticulitis of large intestine without perforation or abscess without bleeding: Secondary | ICD-10-CM

## 2020-04-20 DIAGNOSIS — R0789 Other chest pain: Secondary | ICD-10-CM | POA: Diagnosis not present

## 2020-04-20 DIAGNOSIS — K5900 Constipation, unspecified: Secondary | ICD-10-CM | POA: Diagnosis present

## 2020-04-20 DIAGNOSIS — K5720 Diverticulitis of large intestine with perforation and abscess without bleeding: Secondary | ICD-10-CM | POA: Diagnosis present

## 2020-04-20 DIAGNOSIS — R9431 Abnormal electrocardiogram [ECG] [EKG]: Secondary | ICD-10-CM | POA: Diagnosis not present

## 2020-04-20 DIAGNOSIS — M5136 Other intervertebral disc degeneration, lumbar region: Secondary | ICD-10-CM | POA: Diagnosis not present

## 2020-04-20 DIAGNOSIS — Z66 Do not resuscitate: Secondary | ICD-10-CM | POA: Diagnosis not present

## 2020-04-20 DIAGNOSIS — R1084 Generalized abdominal pain: Secondary | ICD-10-CM | POA: Diagnosis not present

## 2020-04-20 DIAGNOSIS — R627 Adult failure to thrive: Secondary | ICD-10-CM | POA: Diagnosis present

## 2020-04-20 DIAGNOSIS — E871 Hypo-osmolality and hyponatremia: Secondary | ICD-10-CM | POA: Diagnosis present

## 2020-04-20 DIAGNOSIS — J431 Panlobular emphysema: Secondary | ICD-10-CM | POA: Diagnosis not present

## 2020-04-20 DIAGNOSIS — Z7189 Other specified counseling: Secondary | ICD-10-CM | POA: Diagnosis not present

## 2020-04-20 LAB — BASIC METABOLIC PANEL
Anion gap: 12 (ref 5–15)
BUN: 24 mg/dL — ABNORMAL HIGH (ref 8–23)
CO2: 32 mmol/L (ref 22–32)
Calcium: 8.1 mg/dL — ABNORMAL LOW (ref 8.9–10.3)
Chloride: 84 mmol/L — ABNORMAL LOW (ref 98–111)
Creatinine, Ser: 1.14 mg/dL — ABNORMAL HIGH (ref 0.44–1.00)
GFR, Estimated: 47 mL/min — ABNORMAL LOW (ref >60)
Glucose, Bld: 101 mg/dL — ABNORMAL HIGH (ref 70–99)
Potassium: 3.4 mmol/L — ABNORMAL LOW (ref 3.5–5.1)
Sodium: 128 mmol/L — ABNORMAL LOW (ref 135–145)

## 2020-04-20 LAB — TROPONIN I (HIGH SENSITIVITY)
Troponin I (High Sensitivity): 22 ng/L — ABNORMAL HIGH (ref <18)
Troponin I (High Sensitivity): 21 ng/L — ABNORMAL HIGH (ref <18)

## 2020-04-20 LAB — CBC WITH DIFFERENTIAL/PLATELET
Abs Immature Granulocytes: 0.09 10*3/uL — ABNORMAL HIGH (ref 0.00–0.07)
Basophils Absolute: 0 10*3/uL (ref 0.0–0.1)
Basophils Relative: 0 %
Eosinophils Absolute: 0 10*3/uL (ref 0.0–0.5)
Eosinophils Relative: 0 %
HCT: 31.2 % — ABNORMAL LOW (ref 36.0–46.0)
Hemoglobin: 10.1 g/dL — ABNORMAL LOW (ref 12.0–15.0)
Immature Granulocytes: 1 %
Lymphocytes Relative: 9 %
Lymphs Abs: 1.4 10*3/uL (ref 0.7–4.0)
MCH: 31.6 pg (ref 26.0–34.0)
MCHC: 32.4 g/dL (ref 30.0–36.0)
MCV: 97.5 fL (ref 80.0–100.0)
Monocytes Absolute: 1.4 10*3/uL — ABNORMAL HIGH (ref 0.1–1.0)
Monocytes Relative: 9 %
Neutro Abs: 12.6 10*3/uL — ABNORMAL HIGH (ref 1.7–7.7)
Neutrophils Relative %: 81 %
Platelets: 212 10*3/uL (ref 150–400)
RBC: 3.2 MIL/uL — ABNORMAL LOW (ref 3.87–5.11)
RDW: 13.8 % (ref 11.5–15.5)
WBC: 15.5 10*3/uL — ABNORMAL HIGH (ref 4.0–10.5)
nRBC: 0 % (ref 0.0–0.2)

## 2020-04-20 LAB — COMPREHENSIVE METABOLIC PANEL
ALT: 30 U/L (ref 0–44)
AST: 40 U/L (ref 15–41)
Albumin: 2.2 g/dL — ABNORMAL LOW (ref 3.5–5.0)
Alkaline Phosphatase: 126 U/L (ref 38–126)
Anion gap: 11 (ref 5–15)
BUN: 29 mg/dL — ABNORMAL HIGH (ref 8–23)
CO2: 34 mmol/L — ABNORMAL HIGH (ref 22–32)
Calcium: 8.3 mg/dL — ABNORMAL LOW (ref 8.9–10.3)
Chloride: 79 mmol/L — ABNORMAL LOW (ref 98–111)
Creatinine, Ser: 1.38 mg/dL — ABNORMAL HIGH (ref 0.44–1.00)
GFR, Estimated: 38 mL/min — ABNORMAL LOW (ref >60)
Glucose, Bld: 102 mg/dL — ABNORMAL HIGH (ref 70–99)
Potassium: 3.6 mmol/L (ref 3.5–5.1)
Sodium: 124 mmol/L — ABNORMAL LOW (ref 135–145)
Total Bilirubin: 0.7 mg/dL (ref 0.3–1.2)
Total Protein: 5.3 g/dL — ABNORMAL LOW (ref 6.5–8.1)

## 2020-04-20 LAB — RESPIRATORY PANEL BY RT PCR (FLU A&B, COVID)
Influenza A by PCR: NEGATIVE
Influenza B by PCR: NEGATIVE
SARS Coronavirus 2 by RT PCR: NEGATIVE

## 2020-04-20 LAB — URINALYSIS, ROUTINE W REFLEX MICROSCOPIC
Bilirubin Urine: NEGATIVE
Glucose, UA: NEGATIVE mg/dL
Hgb urine dipstick: NEGATIVE
Ketones, ur: NEGATIVE mg/dL
Leukocytes,Ua: NEGATIVE
Nitrite: NEGATIVE
Protein, ur: NEGATIVE mg/dL
Specific Gravity, Urine: 1.008 (ref 1.005–1.030)
pH: 6 (ref 5.0–8.0)

## 2020-04-20 LAB — PROTIME-INR
INR: 1 (ref 0.8–1.2)
Prothrombin Time: 13 seconds (ref 11.4–15.2)

## 2020-04-20 LAB — LACTIC ACID, PLASMA
Lactic Acid, Venous: 1.6 mmol/L (ref 0.5–1.9)
Lactic Acid, Venous: 2.3 mmol/L (ref 0.5–1.9)
Lactic Acid, Venous: 2 mmol/L (ref 0.5–1.9)
Lactic Acid, Venous: 1.2 mmol/L (ref 0.5–1.9)

## 2020-04-20 LAB — GROUP A STREP BY PCR: Group A Strep by PCR: NOT DETECTED

## 2020-04-20 LAB — MRSA PCR SCREENING: MRSA by PCR: NEGATIVE

## 2020-04-20 LAB — APTT: aPTT: 30 seconds (ref 24–36)

## 2020-04-20 MED ORDER — METRONIDAZOLE IN NACL 5-0.79 MG/ML-% IV SOLN
500.0000 mg | Freq: Once | INTRAVENOUS | Status: AC
  Administered 2020-04-20: 500 mg via INTRAVENOUS
  Filled 2020-04-20: qty 100

## 2020-04-20 MED ORDER — ALBUTEROL SULFATE (2.5 MG/3ML) 0.083% IN NEBU: 2.5000 mg | INHALATION_SOLUTION | RESPIRATORY_TRACT | Status: DC | PRN

## 2020-04-20 MED ORDER — HYDROCODONE-ACETAMINOPHEN 5-325 MG PO TABS: 1.0000 | ORAL_TABLET | ORAL | Status: DC | PRN

## 2020-04-20 MED ORDER — ASPIRIN 81 MG PO CHEW
81.0000 mg | CHEWABLE_TABLET | Freq: Every day | ORAL | Status: DC
  Administered 2020-04-23 – 2020-04-24 (×2): 81 mg via ORAL
  Filled 2020-04-20 (×4): qty 1

## 2020-04-20 MED ORDER — VANCOMYCIN HCL IN DEXTROSE 1-5 GM/200ML-% IV SOLN
1000.0000 mg | Freq: Once | INTRAVENOUS | Status: AC
  Administered 2020-04-20: 1000 mg via INTRAVENOUS
  Filled 2020-04-20: qty 200

## 2020-04-20 MED ORDER — MOMETASONE FURO-FORMOTEROL FUM 200-5 MCG/ACT IN AERO
1.0000 | INHALATION_SPRAY | Freq: Two times a day (BID) | RESPIRATORY_TRACT | Status: DC
  Administered 2020-04-20 – 2020-04-24 (×9): 1 via RESPIRATORY_TRACT
  Filled 2020-04-20: qty 8.8

## 2020-04-20 MED ORDER — SODIUM CHLORIDE 0.9 % IV SOLN
2.0000 g | Freq: Once | INTRAVENOUS | Status: AC
  Administered 2020-04-20: 2 g via INTRAVENOUS
  Filled 2020-04-20: qty 2

## 2020-04-20 MED ORDER — FUROSEMIDE 10 MG/ML IJ SOLN
20.0000 mg | Freq: Once | INTRAMUSCULAR | Status: AC
  Administered 2020-04-20: 20 mg via INTRAVENOUS
  Filled 2020-04-20: qty 2

## 2020-04-20 MED ORDER — MORPHINE SULFATE (PF) 2 MG/ML IV SOLN
2.0000 mg | INTRAVENOUS | Status: DC | PRN
  Administered 2020-04-21 – 2020-04-24 (×5): 2 mg via INTRAVENOUS
  Filled 2020-04-20 (×6): qty 1

## 2020-04-20 MED ORDER — IPRATROPIUM-ALBUTEROL 0.5-2.5 (3) MG/3ML IN SOLN
3.0000 mL | Freq: Once | RESPIRATORY_TRACT | Status: AC
  Administered 2020-04-20: 3 mL via RESPIRATORY_TRACT
  Filled 2020-04-20: qty 3

## 2020-04-20 MED ORDER — SODIUM CHLORIDE 0.9 % IV SOLN: 2.0000 g | Freq: Once | INTRAVENOUS | Status: DC

## 2020-04-20 MED ORDER — LACTATED RINGERS IV BOLUS (SEPSIS)
1000.0000 mL | Freq: Once | INTRAVENOUS | Status: AC
  Administered 2020-04-20: 1000 mL via INTRAVENOUS

## 2020-04-20 MED ORDER — LACTATED RINGERS IV BOLUS
1000.0000 mL | Freq: Once | INTRAVENOUS | Status: AC
  Administered 2020-04-20: 1000 mL via INTRAVENOUS

## 2020-04-20 MED ORDER — METRONIDAZOLE IN NACL 5-0.79 MG/ML-% IV SOLN
500.0000 mg | Freq: Three times a day (TID) | INTRAVENOUS | Status: DC
  Administered 2020-04-20 – 2020-04-25 (×13): 500 mg via INTRAVENOUS
  Filled 2020-04-20 (×13): qty 100

## 2020-04-20 MED ORDER — LEVOTHYROXINE SODIUM 100 MCG PO TABS
100.0000 ug | ORAL_TABLET | Freq: Every day | ORAL | Status: DC
  Administered 2020-04-23 – 2020-04-24 (×2): 100 ug via ORAL
  Filled 2020-04-20 (×2): qty 1

## 2020-04-20 MED ORDER — HEPARIN SODIUM (PORCINE) 5000 UNIT/ML IJ SOLN
5000.0000 [IU] | Freq: Three times a day (TID) | INTRAMUSCULAR | Status: DC
  Administered 2020-04-20 – 2020-04-25 (×12): 5000 [IU] via SUBCUTANEOUS
  Filled 2020-04-20 (×12): qty 1

## 2020-04-20 MED ORDER — DEXTROSE-NACL 5-0.45 % IV SOLN: INTRAVENOUS | Status: DC

## 2020-04-20 MED ORDER — LACTATED RINGERS IV SOLN: INTRAVENOUS | Status: DC

## 2020-04-20 MED ORDER — IPRATROPIUM-ALBUTEROL 0.5-2.5 (3) MG/3ML IN SOLN: 3.0000 mL | Freq: Four times a day (QID) | RESPIRATORY_TRACT | Status: DC

## 2020-04-20 MED ORDER — VANCOMYCIN HCL IN DEXTROSE 1-5 GM/200ML-% IV SOLN: 1000.0000 mg | INTRAVENOUS | Status: DC

## 2020-04-20 MED ORDER — SODIUM CHLORIDE 0.9 % IV BOLUS
1000.0000 mL | Freq: Once | INTRAVENOUS | Status: AC
  Administered 2020-04-20: 1000 mL via INTRAVENOUS

## 2020-04-20 MED ORDER — SODIUM CHLORIDE 0.9% FLUSH
3.0000 mL | Freq: Two times a day (BID) | INTRAVENOUS | Status: DC
  Administered 2020-04-20 – 2020-04-23 (×4): 3 mL via INTRAVENOUS

## 2020-04-20 MED ORDER — IPRATROPIUM-ALBUTEROL 0.5-2.5 (3) MG/3ML IN SOLN
3.0000 mL | Freq: Three times a day (TID) | RESPIRATORY_TRACT | Status: DC
  Administered 2020-04-20 – 2020-04-21 (×4): 3 mL via RESPIRATORY_TRACT
  Filled 2020-04-20 (×5): qty 3

## 2020-04-20 MED ORDER — FUROSEMIDE 40 MG PO TABS
40.0000 mg | ORAL_TABLET | Freq: Two times a day (BID) | ORAL | Status: DC
  Filled 2020-04-20: qty 1

## 2020-04-20 MED ORDER — SODIUM CHLORIDE 0.9 % IV SOLN
2.0000 g | INTRAVENOUS | Status: DC
  Administered 2020-04-21: 2 g via INTRAVENOUS
  Filled 2020-04-20: qty 2

## 2020-04-20 MED ORDER — PREDNISONE 20 MG PO TABS: 40.0000 mg | ORAL_TABLET | Freq: Every day | ORAL | Status: DC

## 2020-04-20 MED ORDER — METHYLPREDNISOLONE SODIUM SUCC 125 MG IJ SOLR
125.0000 mg | Freq: Four times a day (QID) | INTRAMUSCULAR | Status: AC
  Administered 2020-04-20 – 2020-04-21 (×4): 125 mg via INTRAVENOUS
  Filled 2020-04-20 (×4): qty 2

## 2020-04-20 MED ORDER — VANCOMYCIN HCL IN DEXTROSE 1-5 GM/200ML-% IV SOLN: 1000.0000 mg | Freq: Once | INTRAVENOUS | Status: DC

## 2020-04-20 MED ORDER — LACTATED RINGERS IV BOLUS (SEPSIS): 1000.0000 mL | Freq: Once | INTRAVENOUS | Status: DC

## 2020-04-20 MED ORDER — LACTATED RINGERS IV BOLUS (SEPSIS): 250.0000 mL | Freq: Once | INTRAVENOUS | Status: DC

## 2020-04-20 MED ORDER — ACETAMINOPHEN 325 MG PO TABS: 650.0000 mg | ORAL_TABLET | Freq: Four times a day (QID) | ORAL | Status: DC | PRN

## 2020-04-20 MED ORDER — ROSUVASTATIN CALCIUM 20 MG PO TABS
20.0000 mg | ORAL_TABLET | Freq: Every day | ORAL | Status: DC
  Filled 2020-04-20: qty 1

## 2020-04-20 MED ORDER — ACETAMINOPHEN 650 MG RE SUPP: 650.0000 mg | Freq: Four times a day (QID) | RECTAL | Status: DC | PRN

## 2020-04-20 MED ORDER — POLYETHYLENE GLYCOL 3350 17 G PO PACK: 17.0000 g | PACK | Freq: Every day | ORAL | Status: DC | PRN

## 2020-04-20 NOTE — Consult Note (Signed)
NAME:  [name], MRN:  [id number], DOB:  08/08/1932, LOS: 0 ADMISSION DATE:  05/07/2020, CONSULTATION DATE: April 20, 2020 REFERRING MD: Dr. Sonny Masters, CHIEF COMPLAINT: Weakness  Brief History   86 year old female with multiple medical problems admitted on April 20, 2020 in the setting of generalized weakness and sepsis.  History of present illness   This is an 86 year old female who was just hospitalized in this facility in the setting of an alcohol withdrawal seizure, NSTEMI and COPD exacerbation who returns today after she was noted to be generally weak, not eating, and hypotensive at her nursing home.  Specifically she was noted to have chills, some mild confusion and so she was brought over today for evaluation.  In the emergency room she was noted to have findings worrisome for sepsis of undetermined etiology.  Previously the patient been hospitalized here after having alcohol withdrawal seizure during that hospital visit she had a non-ST elevation MI, COPD exacerbation, pneumonia and significant hyponatremia.  Her daughter says that she has not had a bowel movement since at least October 29 and she has had very poor p.o. intake.  At her nursing facility she had not yet received physical therapy and she worries that she was actually urinating on herself in bed.  She says that she was eating very little but had no bowel movements at the nursing facility.  The patient tells me today that she has left-sided abdominal pain which has been constant and worse over the last 2 days she has a history of diverticulitis.  Denies significant cough or dyspnea.  Past Medical History  Hypothyroidism Alcohol abuse with history of alcohol withdrawal seizure in October 2021 COPD on 3 L of oxygen at night GERD Diastolic heart failure Hyperlipidemia Diverticulitis  Significant Hospital Events   November 1 admitted  Consults:  Pulmonary and critical care medicine  Procedures:   None  Significant Diagnostic Tests:    Micro Data:  November 1 blood culture November 1 SARS-CoV-2 negative  Antimicrobials:  November 1 vancomycin November 1 metronidazole November 1 cefepime  Interim history/subjective:    Objective   Blood pressure (!) 105/49, pulse 75, temperature (!) 97.5 F (36.4 C), temperature source Oral, resp. rate 20, height 5\' 6"  (1.676 m), weight 66.2 kg, SpO2 91 %.        Intake/Output Summary (Last 24 hours) at 05/13/2020 1846 Last data filed at 04/26/2020 1253 Gross per 24 hour  Intake 1809 ml  Output 100 ml  Net 1709 ml   Filed Weights   04/21/2020 0743  Weight: 66.2 kg    Examination:  General:  Resting comfortably in bed HENT: NCAT OP clear PULM: CTA B, normal effort CV: RRR, no mgr GI: BS+, distended, left lower quadrant tenderness MSK: normal bulk and tone Neuro: awake, alert, no distress, MAEW   Resolved Hospital Problem list     Assessment & Plan:  Diverticulitis: IV fluids Cefepime Flagyl Consider CT scan of her abdomen tomorrow  AKI Bolus another liter of LR D5 1/2 ns Monitor BMET and UOP Replace electrolytes as needed   Question of small bowel obstruction: She has bowel sounds so perhaps this is a partial small bowel obstruction but I think the clinical picture fits most with diverticulitis NG tube decision per general surgery who has been consulted  Sepsis due to diverticulitis Continue IV fluids Monitor hemodynamics Antibiotics as above  Rest as per primary service  Code status DNR  Best practice:    Labs   CBC:  Recent Labs  Lab 04/15/20 0439 04/16/20 0403 04/17/20 0419 05/14/2020 0748  WBC 10.1 9.3 7.5 15.5*  NEUTROABS  --  7.5 5.8 12.6*  HGB 9.9* 9.9* 9.6* 10.1*  HCT 31.4* 31.0* 29.4* 31.2*  MCV 100.0 99.4 97.7 97.5  PLT 186 181 161 341    Basic Metabolic Panel: Recent Labs  Lab 04/14/20 0503 04/15/20 0439 04/16/20 0403 04/17/20 0419 05/09/2020 0748  NA 132* 132* 133* 132*  124*  K 3.8 3.6 3.3* 3.5 3.6  CL 84* 82* 82* 81* 79*  CO2 40* 41* 43* 42* 34*  GLUCOSE 123* 115* 92 93 102*  BUN 11 14 14 14  29*  CREATININE 0.86 0.78 0.79 0.79 1.38*  CALCIUM 8.5* 8.7* 8.7* 8.5* 8.3*  MG  --   --  2.0 2.1  --    GFR: Estimated Creatinine Clearance: 27.9 mL/min (A) (by C-G formula based on SCr of 1.38 mg/dL (H)). Recent Labs  Lab 04/15/20 0439 04/16/20 0403 04/17/20 0419 05/14/2020 0748 05/09/2020 0948 04/23/2020 1332  WBC 10.1 9.3 7.5 15.5*  --   --   LATICACIDVEN  --   --   --  1.6 2.3* 2.0*    Liver Function Tests: Recent Labs  Lab 04/14/20 0503 04/15/20 0439 04/16/20 0403 04/17/20 0419 05/04/2020 0748  AST 67* 54* 42* 43* 40  ALT 48* 46* 38 39 30  ALKPHOS 106 103 99 106 126  BILITOT 0.7 0.8 0.8 0.7 0.7  PROT 5.4* 5.3* 5.1* 5.1* 5.3*  ALBUMIN 2.4* 2.5* 2.2* 2.1* 2.2*   No results for input(s): LIPASE, AMYLASE in the last 168 hours. No results for input(s): AMMONIA in the last 168 hours.  ABG    Component Value Date/Time   PHART 7.322 (L) 04/07/2020 0324   PCO2ART 37.7 04/07/2020 0324   PO2ART 153 (H) 04/07/2020 0324   HCO3 19.0 (L) 04/07/2020 0324   ACIDBASEDEF 6.0 (H) 04/07/2020 0324   O2SAT 98.8 04/07/2020 0324     Coagulation Profile: Recent Labs  Lab 05/08/2020 0748  INR 1.0    Cardiac Enzymes: No results for input(s): CKTOTAL, CKMB, CKMBINDEX, TROPONINI in the last 168 hours.  HbA1C: Hgb A1c MFr Bld  Date/Time Value Ref Range Status  11/05/2019 03:17 PM 4.7 4.6 - 6.5 % Final    Comment:    Glycemic Control Guidelines for People with Diabetes:Non Diabetic:  <6%Goal of Therapy: <7%Additional Action Suggested:  >8%   06/02/2017 12:10 PM 5.2 4.6 - 6.5 % Final    Comment:    Glycemic Control Guidelines for People with Diabetes:Non Diabetic:  <6%Goal of Therapy: <7%Additional Action Suggested:  >8%     CBG: No results for input(s): GLUCAP in the last 168 hours.  Review of Systems:   Gen: Denies fever, chills, weight change,  fatigue, night sweats HEENT: Denies blurred vision, double vision, hearing loss, tinnitus, sinus congestion, rhinorrhea, sore throat, neck stiffness, dysphagia PULM: Denies shortness of breath, cough, sputum production, hemoptysis, wheezing CV: Denies chest pain, edema, orthopnea, paroxysmal nocturnal dyspnea, palpitations GI: per HPI GU: Denies dysuria, hematuria, polyuria, oliguria, urethral discharge Endocrine: Denies hot or cold intolerance, polyuria, polyphagia or appetite change Derm: Denies rash, dry skin, scaling or peeling skin change Heme: Denies easy bruising, bleeding, bleeding gums Neuro: Denies headache, numbness, weakness, slurred speech, loss of memory or consciousness   Past Medical History  She,  has a past medical history of Allergic rhinitis, Anxiety, Arthritis, Asthma, Bladder atony, Chronic bronchitis ([place]), COPD (chronic obstructive pulmonary disease) ([place]), Diarrhea, Emphysema of  lung ([place]), GERD (gastroesophageal reflux disease), Hyperlipidemia, Non-small cell carcinoma of lung, stage 1 ([place]) (2005), On home oxygen therapy, Pelvic cyst, Pneumonia, and Unspecified hypothyroidism.   Surgical History    Past Surgical History:  Procedure Laterality Date  . ABDOMINAL HYSTERECTOMY  ~ 1976  . BACK SURGERY    . CATARACT EXTRACTION W/ INTRAOCULAR LENS  IMPLANT, BILATERAL Bilateral ~ 2012  . DILATION AND CURETTAGE OF UTERUS    . LUMBAR DISC SURGERY  1970's  . LYMPH NODE DISSECTION  2005   "all my lymph nodes under breasts, went thru my back; Dr. Arlyce Dice did it"  . Needle aspiration Pelvic cyst  2011   Dr Diona Fanti  . VIDEO ASSISTED THORACOSCOPY (VATS)/THOROCOTOMY Right 09/2003   thoracotomy, right upper lobectomy with node dissection; Dr Demetrios Loll 11/02/2010  . VIDEO BRONCHOSCOPY  03/2004   Archie Endo 11/02/2010     Social History   reports that she quit smoking about 16 years ago. Her smoking use included cigarettes. She has a 42.00 pack-year smoking history. She has  never used smokeless tobacco. She reports current alcohol use of about 14.0 standard drinks of alcohol per week. She reports that she does not use drugs.   Family History   Her family history includes Breast cancer in her daughter; Cancer in her brother; Heart disease in her father; Rheumatologic disease in her brother and sister.   Allergies Allergies  Allergen Reactions  . Ampicillin Diarrhea and Nausea Only    GI upset  . Azithromycin Diarrhea and Nausea And Vomiting  . Codeine Nausea And Vomiting and Other (See Comments)    Makes her "crazy"  . Daliresp [Roflumilast] Nausea Only  . Nitrofurantoin Diarrhea and Nausea Only  . Other Other (See Comments)    No nuts and seeds because of diverticulitis  . Penicillins Nausea And Vomiting, Swelling and Rash    Has patient had a PCN reaction causing immediate rash, facial/tongue/throat swelling, SOB or lightheadedness with hypotension: Yes Has patient had a PCN reaction causing severe rash involving mucus membranes or skin necrosis: No Has patient had a PCN reaction that required hospitalization No Has patient had a PCN reaction occurring within the last 10 years: No No "cillins" If all of the above answers are "NO", then may proceed with Cephalosporin use.   . Sulfonamide Derivatives Diarrhea and Nausea And Vomiting  . Tiotropium Bromide Monohydrate     Urinary retention  . Cephalosporins Nausea And Vomiting    IV seems to be ok  . Doxycycline Nausea Only  . Protonix [Pantoprazole] Nausea Only  . Fludrocortisone Acetate Itching    Tolerates prednisone  . Latex Itching and Rash     Home Medications  Prior to Admission medications   Medication Sig Start Date End Date Taking? Authorizing Provider  ALPRAZolam Duanne Moron) 1 MG tablet TAKE 1 TABLET BY MOUTH EVERYDAY AT BEDTIME 03/09/20   Binnie Rail, MD  aspirin 81 MG chewable tablet Chew 1 tablet (81 mg total) by mouth daily. 04/17/20   Dwyane Dee, MD  Cholecalciferol (VITAMIN D)  2000 UNITS tablet Take 1 tablet (2,000 Units total) by mouth daily. 04/29/15   Rowe Clack, MD  Ferrous Fumarate (HEMOCYTE - 106 MG FE) 324 (106 Fe) MG TABS tablet Take 1 tablet by mouth 3 (three) times a week.    [provider]  FLOVENT HFA 110 MCG/ACT inhaler TAKE 2 PUFFS BY MOUTH TWICE A DAY 01/21/20   Baird Lyons D, MD  furosemide (LASIX) 40 MG tablet Take  1 tablet (40 mg total) by mouth 2 (two) times daily. 04/17/20   Dwyane Dee, MD  guaiFENesin (MUCINEX) 600 MG 12 hr tablet Take 1 tablet (600 mg total) by mouth 2 (two) times daily as needed for to loosen phlegm. 04/17/20   Dwyane Dee, MD  levalbuterol Penne Lash) 1.25 MG/3ML nebulizer solution Take 1.25 mg by nebulization every 6 (six) hours as needed for wheezing. DX: Bronchiectasis 09/27/19   Baird Lyons D, MD  levothyroxine (SYNTHROID) 100 MCG tablet Take 1 tablet (100 mcg total) by mouth daily before breakfast. 04/17/20   Dwyane Dee, MD  losartan (COZAAR) 25 MG tablet Take 1 tablet (25 mg total) by mouth daily. 04/17/20   Dwyane Dee, MD  Multiple Vitamins-Minerals (CENTRUM SILVER PO) Take 1 tablet by mouth daily.     [provider]  ondansetron (ZOFRAN) 4 MG tablet Take 1 tablet (4 mg total) by mouth 3 (three) times daily as needed for nausea or vomiting. 03/26/20   Janith Lima, MD  predniSONE (DELTASONE) 20 MG tablet Take 2 tablets (40 mg total) by mouth daily with breakfast for 2 days. 04/18/20 05/18/2020  Dwyane Dee, MD  Probiotic Product (PROBIOTIC PO) Take 1 capsule by mouth daily.    [provider]  Respiratory Therapy Supplies (FLUTTER) DEVI Blow through 4 times per cycle and repeat 3 cycles per day 10/15/13   Baird Lyons D, MD  rosuvastatin (CRESTOR) 20 MG tablet Take 1 tablet (20 mg total) by mouth daily. 04/17/20   Dwyane Dee, MD     Critical care time:30 minutes     Roselie Awkward, MD [facility] Pager: [phone] Cell: [phone] If no response, call  [phone]

## 2020-04-20 NOTE — H&P (Signed)
History and Physical        Hospital Admission Note Date: 04/28/2020  Patient name: [name] Medical record number: [id number] Date of birth: Jan 03, 1933 Age: 86 y.o. Gender: female  PCP: Binnie Rail, MD  Patient coming from: SNF   Chief Complaint    No chief complaint on file.     HPI:   This is an 86 year old female with a past medical history of chronic hypoxic respiratory failure on 3 L/min, chronic hyponatremia COPD, hyperlipidemia, combined systolic and diastolic heart failure, hypothyroidism who was recently admitted from 10/19-10/29 with multiple medical issues including alcohol withdrawal seizure, NSTEMI (troponin up to 34,000), COPD exacerbation, secondary to CAP and hyponatremia who was discharged to SNF and presents today with complaints of sore throat and chest tightness.  Patient was noted to be hypotensive by EMS 86/42 which improved to 99/51 upon arrival after fluids.  Daughter at bedside states that the patient has seen much more lethargic over the past couple days and has been very sedentary at the facility since it has been the weekend..  She is also not been eating or drinking much  When I visited the patient at the bedside she continued to complain of sore throat and hoarse voice and felt generalized myalgias and overall malaise.  She complained of abdominal pain and distention as well.  Throughout the day she has also been short of breath, wheezing and with a minimally productive cough.  ED Course: Afebrile, hypotensive at 88/58 which improved with some fluids but still soft pressures in the 90s to low 100s, requiring up to 5 LPM.  Notable labs: Sodium 124, chloride 79, CO2 34, BUN 29, creatinine 1.38, troponin flat at 20x2, lactic acid 1.6-> 2.3-> 2.0, WBC 15.5, Hb 10.1.  Throat swab negative for strep, COVID-19 and flu negative  Vitals:   05/18/2020  1330 04/22/2020 1536  BP: (!) 106/59 (!) 105/49  Pulse: 76 75  Resp: (!) 22 20  Temp:  (!) 97.5 F (36.4 C)  SpO2: (!) 81%      Review of Systems:  Review of Systems  Constitutional: Positive for malaise/fatigue. Negative for fever.  Respiratory: Positive for cough, sputum production, shortness of breath and wheezing.   Cardiovascular: Negative for palpitations and leg swelling.  Gastrointestinal: Positive for abdominal pain and constipation. Negative for nausea and vomiting.  Musculoskeletal: Positive for myalgias. Negative for falls.  Neurological: Positive for weakness.  All other systems reviewed and are negative.   Medical/Social/Family History   Past Medical History: Past Medical History:  Diagnosis Date  . Allergic rhinitis   . Anxiety   . Arthritis    "back, hands; hips" (12/22/2014)  . Asthma   . Bladder atony   . Chronic bronchitis ([place])   . COPD (chronic obstructive pulmonary disease) ([place])   . Diarrhea   . Emphysema of lung ([place])   . GERD (gastroesophageal reflux disease)    "w/spicey foods" (12/22/2014)  . Hyperlipidemia   . Non-small cell carcinoma of lung, stage 1 ([place]) 2005   1.4 cm Poorly differentiated Squamous cell RUL  T1N0 resected 09/22/2003  . On home oxygen therapy    "3L q night and prn during the day" (12/22/2014)  .  Pelvic cyst   . Pneumonia    "this is the 3rd time that I can remember" (12/22/2014)  . Unspecified hypothyroidism     Past Surgical History:  Procedure Laterality Date  . ABDOMINAL HYSTERECTOMY  ~ 1976  . BACK SURGERY    . CATARACT EXTRACTION W/ INTRAOCULAR LENS  IMPLANT, BILATERAL Bilateral ~ 2012  . DILATION AND CURETTAGE OF UTERUS    . LUMBAR DISC SURGERY  1970's  . LYMPH NODE DISSECTION  2005   "all my lymph nodes under breasts, went thru my back; Dr. Arlyce Dice did it"  . Needle aspiration Pelvic cyst  2011   Dr Diona Fanti  . VIDEO ASSISTED THORACOSCOPY (VATS)/THOROCOTOMY Right 09/2003   thoracotomy, right upper lobectomy with  node dissection; Dr Demetrios Loll 11/02/2010  . VIDEO BRONCHOSCOPY  03/2004   Archie Endo 11/02/2010    Medications: Prior to Admission medications   Medication Sig Start Date End Date Taking? Authorizing Provider  ALPRAZolam Duanne Moron) 1 MG tablet TAKE 1 TABLET BY MOUTH EVERYDAY AT BEDTIME 03/09/20   Binnie Rail, MD  aspirin 81 MG chewable tablet Chew 1 tablet (81 mg total) by mouth daily. 04/17/20   Dwyane Dee, MD  Cholecalciferol (VITAMIN D) 2000 UNITS tablet Take 1 tablet (2,000 Units total) by mouth daily. 04/29/15   Rowe Clack, MD  Ferrous Fumarate (HEMOCYTE - 106 MG FE) 324 (106 Fe) MG TABS tablet Take 1 tablet by mouth 3 (three) times a week.    [provider]  FLOVENT HFA 110 MCG/ACT inhaler TAKE 2 PUFFS BY MOUTH TWICE A DAY 01/21/20   Young, Tarri Fuller D, MD  furosemide (LASIX) 40 MG tablet Take 1 tablet (40 mg total) by mouth 2 (two) times daily. 04/17/20   Dwyane Dee, MD  guaiFENesin (MUCINEX) 600 MG 12 hr tablet Take 1 tablet (600 mg total) by mouth 2 (two) times daily as needed for to loosen phlegm. 04/17/20   Dwyane Dee, MD  levalbuterol Penne Lash) 1.25 MG/3ML nebulizer solution Take 1.25 mg by nebulization every 6 (six) hours as needed for wheezing. DX: Bronchiectasis 09/27/19   Baird Lyons D, MD  levothyroxine (SYNTHROID) 100 MCG tablet Take 1 tablet (100 mcg total) by mouth daily before breakfast. 04/17/20   Dwyane Dee, MD  losartan (COZAAR) 25 MG tablet Take 1 tablet (25 mg total) by mouth daily. 04/17/20   Dwyane Dee, MD  Multiple Vitamins-Minerals (CENTRUM SILVER PO) Take 1 tablet by mouth daily.     [provider]  ondansetron (ZOFRAN) 4 MG tablet Take 1 tablet (4 mg total) by mouth 3 (three) times daily as needed for nausea or vomiting. 03/26/20   Janith Lima, MD  predniSONE (DELTASONE) 20 MG tablet Take 2 tablets (40 mg total) by mouth daily with breakfast for 2 days. 04/18/20 05/13/2020  Dwyane Dee, MD  Probiotic Product (PROBIOTIC  PO) Take 1 capsule by mouth daily.    [provider]  Respiratory Therapy Supplies (FLUTTER) DEVI Blow through 4 times per cycle and repeat 3 cycles per day 10/15/13   Baird Lyons D, MD  rosuvastatin (CRESTOR) 20 MG tablet Take 1 tablet (20 mg total) by mouth daily. 04/17/20   Dwyane Dee, MD    Allergies:   Allergies  Allergen Reactions  . Ampicillin Diarrhea and Nausea Only    GI upset  . Azithromycin Diarrhea and Nausea And Vomiting  . Codeine Nausea And Vomiting and Other (See Comments)    Makes her "crazy"  . Daliresp [Roflumilast] Nausea Only  .  Nitrofurantoin Diarrhea and Nausea Only  . Other Other (See Comments)    No nuts and seeds because of diverticulitis  . Penicillins Nausea And Vomiting, Swelling and Rash    Has patient had a PCN reaction causing immediate rash, facial/tongue/throat swelling, SOB or lightheadedness with hypotension: Yes Has patient had a PCN reaction causing severe rash involving mucus membranes or skin necrosis: No Has patient had a PCN reaction that required hospitalization No Has patient had a PCN reaction occurring within the last 10 years: No No "cillins" If all of the above answers are "NO", then may proceed with Cephalosporin use.   . Sulfonamide Derivatives Diarrhea and Nausea And Vomiting  . Tiotropium Bromide Monohydrate     Urinary retention  . Cephalosporins Nausea And Vomiting    IV seems to be ok  . Doxycycline Nausea Only  . Protonix [Pantoprazole] Nausea Only  . Fludrocortisone Acetate Itching    Tolerates prednisone  . Latex Itching and Rash    Social History:  reports that she quit smoking about 16 years ago. Her smoking use included cigarettes. She has a 42.00 pack-year smoking history. She has never used smokeless tobacco. She reports current alcohol use of about 14.0 standard drinks of alcohol per week. She reports that she does not use drugs.  Family History: Family History  Problem Relation Age of Onset    . Heart disease Father   . Rheumatologic disease Brother   . Cancer Brother        Leukemia  . Rheumatologic disease Sister   . Breast cancer Daughter      Objective   Physical Exam: Blood pressure (!) 105/49, pulse 75, temperature (!) 97.5 F (36.4 C), temperature source Oral, resp. rate 20, height 5\' 6"  (1.676 m), weight 66.2 kg, SpO2 (!) 81 %.  Physical Exam Vitals and nursing note reviewed. Exam conducted with a chaperone present.  Constitutional:      Appearance: She is ill-appearing. She is not diaphoretic.     Comments: Hoarse voice  HENT:     Head: Normocephalic.  Eyes:     Conjunctiva/sclera: Conjunctivae normal.  Cardiovascular:     Rate and Rhythm: Normal rate and regular rhythm.  Pulmonary:     Effort: Respiratory distress present.     Breath sounds: Wheezing present.  Abdominal:     General: There is distension.     Tenderness: There is abdominal tenderness.  Musculoskeletal:        General: No swelling or tenderness.  Skin:    Coloration: Skin is pale.     Findings: No rash.  Neurological:     Comments: Lethargic but oriented  Psychiatric:        Thought Content: Thought content normal.     LABS on Admission: I have personally reviewed all the labs and imaging below    Basic Metabolic Panel: Recent Labs  Lab 04/17/20 0419 05/09/2020 0748  NA 132* 124*  K 3.5 3.6  CL 81* 79*  CO2 42* 34*  GLUCOSE 93 102*  BUN 14 29*  CREATININE 0.79 1.38*  CALCIUM 8.5* 8.3*  MG 2.1  --    Liver Function Tests: Recent Labs  Lab 04/17/20 0419 05/15/2020 0748  AST 43* 40  ALT 39 30  ALKPHOS 106 126  BILITOT 0.7 0.7  PROT 5.1* 5.3*  ALBUMIN 2.1* 2.2*   No results for input(s): LIPASE, AMYLASE in the last 168 hours. No results for input(s): AMMONIA in the last 168 hours. CBC: Recent Labs  Lab 04/17/20 0419 04/17/20 0419 04/26/2020 0748  WBC 7.5  --  15.5*  NEUTROABS 5.8   < > 12.6*  HGB 9.6*  --  10.1*  HCT 29.4*  --  31.2*  MCV 97.7   < > 97.5   PLT 161  --  212   < > = values in this interval not displayed.   Cardiac Enzymes: No results for input(s): CKTOTAL, CKMB, CKMBINDEX, TROPONINI in the last 168 hours. BNP: Invalid input(s): POCBNP CBG: No results for input(s): GLUCAP in the last 168 hours.  Radiological Exams on Admission:  DG Chest Port 1 View  Result Date: 05/10/2020 CLINICAL DATA:  Shortness of breath. EXAM: PORTABLE CHEST 1 VIEW COMPARISON:  April 07, 2020. FINDINGS: Stable cardiomediastinal silhouette. No pneumothorax is noted. Mild bibasilar subsegmental atelectasis is noted with probable small pleural effusions. Bony thorax is unremarkable. IMPRESSION: Mild bibasilar subsegmental atelectasis with probable small pleural effusions. Aortic Atherosclerosis (ICD10-I70.0). Electronically Signed   By: Marijo Conception M.D.   On: 05/05/2020 08:53   DG Abd Portable 1V  Result Date: 05/11/2020 CLINICAL DATA:  Generalized abdominal pain. EXAM: PORTABLE ABDOMEN - 1 VIEW COMPARISON:  None. FINDINGS: Mildly dilated small bowel loops are noted concerning for distal small bowel obstruction or ileus. No colonic dilatation is noted. No radio-opaque calculi or other significant radiographic abnormality are seen. IMPRESSION: Mildly dilated small bowel loops are noted concerning for distal small bowel obstruction or ileus. Electronically Signed   By: Marijo Conception M.D.   On: 04/26/2020 14:34      EKG: Independently reviewed.    A & P   Active Problems:   Chronic respiratory failure with hypoxia (HCC)   COPD with acute exacerbation (HCC)   Left lower quadrant abdominal pain   Hyponatremia   Failure to thrive in adult   Chronic combined systolic (congestive) and diastolic (congestive) heart failure (HCC)   AKI (acute kidney injury) ([place])   1. Respiratory distress  COPD exacerbation concern for HAP vs viral etiology vs. Heart failure exacerbation  History of chronic hypoxic respiratory failure on 3 LPM at  baseline a. Afebrile though significant wheezing, cough with increased sputum production and recent antibiotic exposure at last hospitalization b. CXR with mild atelectasis c. Unable to get an accurate pulse ox read d. Continue vancomycin and cefepime pending MRSA nares e. Respiratory viral panel f. Steroids g. Dulera h. Duoneb (patient states she is unsure of the adverse effect listed in her allergy list) i. Aluberol PRN j. Patient going for CT abdomen as below, will add on CT chest and small pleural effusions k. Change PO lasix to IV l. PCCM consult  2. Concern for developing early sepsis of unknown source a. Afebrile however in respiratory distress with lactic acidosis and leukocytosis and hypotension b. Source could be HAP VS.  Abdominal source given distention and abdominal pain c. Continue vancomycin, cefepime and metronidazole d. Change LR to NS given hyponatremia but avoid volume overload given her recent heart failure exacerbation e. Follow-up blood cultures  3. Abdominal pain and distention concerning for SBO a. Initially concern for perforation with lactic acid, bloating and pain prompting x-ray b. Abdominal x-ray: Distal SBO or ileus c. General surgery consulted d. N.p.o.  4. Hypertension, presented with hypotension, currently stable a. Hold antihypertensives b. Continue IV fluids c. May need pressors if she develops shock  5. Acute on chronic hyponatremia a. Baseline sodium about 132, currently 124 b. Follow-up after IV fluids  6. AKI a.  Likely secondary to hemodynamic changes, ARB and poor p.o. intake b. Follow-up in a.m.  7. Sore throat of unknown etiology a. Could be viral as addressed above b. Follow-up respiratory panel  8. Chest pressure likely secondary to above, resolved a. EKG reassuring b. Telemetry  9. Recent NSTEMI a. Troponin flat x2 b. Continue telemetry  10. Combined systolic and diastolic dysfunction, unclear if she is in an  exacerbation at this point a. Heart failure protocol b. Monitor daily weights and intake/output c. Cautious IV hydration for above issues   DVT prophylaxis: Heparin   Code Status: DNR  Diet: N.p.o. Family Communication: Admission, patients condition and plan of care including tests being ordered have been discussed with the patient who indicates understanding and agrees with the plan and Code Status. Patient's daughter was updated  Disposition Plan: The appropriate patient status for this patient is INPATIENT. Inpatient status is judged to be reasonable and necessary in order to provide the required intensity of service to ensure the patient's safety. The patient's presenting symptoms, physical exam findings, and initial radiographic and laboratory data in the context of their chronic comorbidities is felt to place them at high risk for further clinical deterioration. Furthermore, it is not anticipated that the patient will be medically stable for discharge from the hospital within 2 midnights of admission. The following factors support the patient status of inpatient.   " The patient's presenting symptoms include lethargy and failure to thrive. " The worrisome physical exam findings include hypotension, respiratory distress, hypotension. " The initial radiographic and laboratory data are worrisome because of leukocytosis, lactic acidosis, hyponatremia, SBO. " The chronic co-morbidities include recent NSTEMI and alcohol withdrawal seizure, COPD on 3 L.   * I certify that at the point of admission it is my clinical judgment that the patient will require inpatient hospital care spanning beyond 2 midnights from the point of admission due to high intensity of service, high risk for further deterioration and high frequency of surveillance required.*   Status is: Inpatient  Remains inpatient appropriate because:Hemodynamically unstable, Ongoing diagnostic testing needed not appropriate for outpatient  work up, IV treatments appropriate due to intensity of illness or inability to take PO and Inpatient level of care appropriate due to severity of illness   Dispo: The patient is from: SNF              Anticipated d/c is to: tbd              Anticipated d/c date is: > 3 days              Patient currently is not medically stable to d/c.         The medical decision making on this patient was of high complexity and the patient is at high risk for clinical deterioration, therefore this is a level 3  admission.  Consultants  . General Surgery  Procedures  . none  Time Spent on Admission: 90 minutes    Harold Hedge, DO [facility]  05/15/2020, 5:21 PM

## 2020-04-20 NOTE — Progress Notes (Signed)
Georgina Peer  RN IV team attempted midline as per order. Unable to successfully place midline Rt arm due to limitations of patients veins. Recommendations, for a PICC to be placed due to limited access & difficulty in drawing labs. Staff RN aware and to notify MD.

## 2020-04-20 NOTE — Sepsis Progress Note (Signed)
Sepsis protocol being followed by [facility]

## 2020-04-20 NOTE — ED Provider Notes (Signed)
[facility] DEPT Provider Note   CSN: [id number] Arrival date & time: 05/07/2020  0720     History   MARRIANA Esparza is a 86 y.o. female.  HPI   Patient presents to the ED for evaluation of sore throat, chest tightness and hypotension.  Patient was recently admitted to the hospital on October 19 and was discharged on October 29.  Patient initially presented with an acute seizure.  Patient was noted to be hyponatremic, hypokalemic as well as other laboratory abnormalities.  Patient also had an elevated troponin at 3200 and a chest x-ray suggesting possible pneumonia.  Patient was treated for non-ST elevation MI.  She was also treated for her seizure.  It was felt to be possibly related to alcohol withdrawal.  Patient was discharged to a skilled nursing facility.  Patient states she is not sure who called the ambulance today.  She has been complaining of feeling sore all over.  She also is complaining of a sore throat and a hoarse voice.  She does not know of any recent fevers.  She denies any vomiting diarrhea or abdominal pain.  She has not noticed any rashes.  Past Medical History:  Diagnosis Date  . Allergic rhinitis   . Anxiety   . Arthritis    "back, hands; hips" (12/22/2014)  . Asthma   . Bladder atony   . Chronic bronchitis ([place])   . COPD (chronic obstructive pulmonary disease) ([place])   . Diarrhea   . Emphysema of lung ([place])   . GERD (gastroesophageal reflux disease)    "w/spicey foods" (12/22/2014)  . Hyperlipidemia   . Non-small cell carcinoma of lung, stage 1 ([place]) 2005   1.4 cm Poorly differentiated Squamous cell RUL  T1N0 resected 09/22/2003  . On home oxygen therapy    "3L q night and prn during the day" (12/22/2014)  . Pelvic cyst   . Pneumonia    "this is the 3rd time that I can remember" (12/22/2014)  . Unspecified hypothyroidism     Patient Active Problem List   Diagnosis Date Noted  . Acute combined systolic (congestive) and diastolic  (congestive) heart failure ([place]) 04/15/2020  . Encounter for assessment of decision-making capacity 04/15/2020  . Hyponatremia 04/07/2020  . Alcohol abuse 04/07/2020  . Sialadenitis 03/26/2020  . Arm wound, left, initial encounter 01/15/2020  . Open knee wound, right, initial encounter 01/15/2020  . Diarrhea 01/01/2020  . Blood in stool 01/01/2020  . Left lower quadrant abdominal pain 01/01/2020  . Dark stools 11/05/2019  . GERD (gastroesophageal reflux disease) 11/05/2019  . Hypomagnesemia 10/14/2019  . Skin tear of left upper arm without complication 01/31/4817  . COPD with acute exacerbation ([place]) 05/22/2019  . Urinary incontinence due to immobility 05/22/2019  . Non-healing wound of lower extremity 05/03/2019  . Multiple skin tears 03/02/2019  . Weight loss 11/20/2018  . Depression 11/20/2018  . Recurrent falls 11/20/2018  . Parotiditis 09/17/2018  . Primary osteoarthritis of right hip 06/21/2018  . Degenerative disc disease, cervical 12/06/2017  . DOE (dyspnea on exertion) 11/21/2017  . Bilateral swelling of feet 11/02/2017  . Osteopenia 02/16/2017  . Hyperglycemia 12/08/2016  . Degenerative arthritis of right knee 10/27/2016  . 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 Greater trochanteric bursitis of both hips 04/08/2016  . Pain in both knees 04/08/2016  . Chronic midline low back pain without sciatica 04/08/2016  . Chronic respiratory failure with hypoxia ([place]) 03/07/2015  . Diverticulitis 05/24/2014  .  Anxiety   . Bladder prolapse, female, acquired   . Non-small cell carcinoma of lung, stage 1 ([place]) 01/10/2012  . Neuropathy, peripheral 01/28/2011  . COPD mixed type ([place]) 09/08/2010  . Dyslipidemia   . ATONY OF BLADDER 08/10/2009  . Hypothyroidism 03/12/2008  . Seasonal allergic rhinitis 10/10/2007    Past Surgical History:  Procedure Laterality Date  . ABDOMINAL HYSTERECTOMY  ~ 1976  . BACK SURGERY    . CATARACT EXTRACTION W/  INTRAOCULAR LENS  IMPLANT, BILATERAL Bilateral ~ 2012  . DILATION AND CURETTAGE OF UTERUS    . LUMBAR DISC SURGERY  1970's  . LYMPH NODE DISSECTION  2005   "all my lymph nodes under breasts, went thru my back; Dr. Arlyce Dice did it"  . Needle aspiration Pelvic cyst  2011   Dr Diona Fanti  . VIDEO ASSISTED THORACOSCOPY (VATS)/THOROCOTOMY Right 09/2003   thoracotomy, right upper lobectomy with node dissection; Dr Demetrios Loll 11/02/2010  . VIDEO BRONCHOSCOPY  03/2004   Archie Endo 11/02/2010     OB History   No obstetric history on file.     Family History  Problem Relation Age of Onset  . Heart disease Father   . Rheumatologic disease Brother   . Cancer Brother        Leukemia  . Rheumatologic disease Sister   . Breast cancer Daughter     Social History   Tobacco Use  . Smoking status: Former Smoker    Packs/day: 1.00    Years: 42.00    Pack years: 42.00    Types: Cigarettes    Quit date: 09/22/2003    Years since quitting: 16.5  . Smokeless tobacco: Never Used  Vaping Use  . Vaping Use: Never used  Substance Use Topics  . Alcohol use: Yes    Alcohol/week: 14.0 standard drinks    Types: 14 Glasses of wine per week    Comment: 12/22/2014 "I have a large glass of wine qd"  . Drug use: No    Home Medications Prior to Admission medications   Medication Sig Start Date End Date Taking? Authorizing Provider  ALPRAZolam Duanne Moron) 1 MG tablet TAKE 1 TABLET BY MOUTH EVERYDAY AT BEDTIME 03/09/20   Binnie Rail, MD  aspirin 81 MG chewable tablet Chew 1 tablet (81 mg total) by mouth daily. 04/17/20   Dwyane Dee, MD  Cholecalciferol (VITAMIN D) 2000 UNITS tablet Take 1 tablet (2,000 Units total) by mouth daily. 04/29/15   Rowe Clack, MD  Ferrous Fumarate (HEMOCYTE - 106 MG FE) 324 (106 Fe) MG TABS tablet Take 1 tablet by mouth 3 (three) times a week.    [provider]  FLOVENT HFA 110 MCG/ACT inhaler TAKE 2 PUFFS BY MOUTH TWICE A DAY 01/21/20   Young, Tarri Fuller D, MD    furosemide (LASIX) 40 MG tablet Take 1 tablet (40 mg total) by mouth 2 (two) times daily. 04/17/20   Dwyane Dee, MD  guaiFENesin (MUCINEX) 600 MG 12 hr tablet Take 1 tablet (600 mg total) by mouth 2 (two) times daily as needed for to loosen phlegm. 04/17/20   Dwyane Dee, MD  levalbuterol Penne Lash) 1.25 MG/3ML nebulizer solution Take 1.25 mg by nebulization every 6 (six) hours as needed for wheezing. DX: Bronchiectasis 09/27/19   Baird Lyons D, MD  levothyroxine (SYNTHROID) 100 MCG tablet Take 1 tablet (100 mcg total) by mouth daily before breakfast. 04/17/20   Dwyane Dee, MD  losartan (COZAAR) 25 MG tablet Take 1 tablet (25 mg total) by mouth daily.  04/17/20   Dwyane Dee, MD  Multiple Vitamins-Minerals (CENTRUM SILVER PO) Take 1 tablet by mouth daily.     [provider]  ondansetron (ZOFRAN) 4 MG tablet Take 1 tablet (4 mg total) by mouth 3 (three) times daily as needed for nausea or vomiting. 03/26/20   Janith Lima, MD  predniSONE (DELTASONE) 20 MG tablet Take 2 tablets (40 mg total) by mouth daily with breakfast for 2 days. 04/18/20 04/24/2020  Dwyane Dee, MD  Probiotic Product (PROBIOTIC PO) Take 1 capsule by mouth daily.    [provider]  Respiratory Therapy Supplies (FLUTTER) DEVI Blow through 4 times per cycle and repeat 3 cycles per day 10/15/13   Baird Lyons D, MD  rosuvastatin (CRESTOR) 20 MG tablet Take 1 tablet (20 mg total) by mouth daily. 04/17/20   Dwyane Dee, MD    Allergies    Ampicillin, Azithromycin, Codeine, Daliresp [roflumilast], Nitrofurantoin, Other, Penicillins, Sulfonamide derivatives, Tiotropium bromide monohydrate, Cephalosporins, Doxycycline, Protonix [pantoprazole], Fludrocortisone acetate, and Latex  Review of Systems   Review of Systems  All other systems reviewed and are negative.   Physical Exam Updated Vital Signs BP (!) 98/51   Pulse 72   Temp 97.9 F (36.6 C) (Oral)   Resp (!) 21   Ht 1.676 m (5\' 6" )    Wt 66.2 kg   SpO2 97%   BMI 23.57 kg/m   Physical Exam Vitals and nursing note reviewed.  Constitutional:      Appearance: She is well-developed. She is ill-appearing.     Comments: Elderly, frail  HENT:     Head: Normocephalic and atraumatic.     Right Ear: External ear normal.     Left Ear: External ear normal.     Mouth/Throat:     Mouth: Mucous membranes are moist.     Pharynx: No oropharyngeal exudate or posterior oropharyngeal erythema.     Comments: No lesions noted oropharynx, voice is hoarse but no stridor no difficulty handling secretions Eyes:     General: No scleral icterus.       Right eye: No discharge.        Left eye: No discharge.     Conjunctiva/sclera: Conjunctivae normal.  Neck:     Trachea: No tracheal deviation.  Cardiovascular:     Rate and Rhythm: Normal rate and regular rhythm.  Pulmonary:     Effort: Pulmonary effort is normal. No respiratory distress.     Breath sounds: Normal breath sounds. No stridor. No wheezing or rales.  Abdominal:     General: Bowel sounds are normal. There is no distension.     Palpations: Abdomen is soft.     Tenderness: There is no abdominal tenderness. There is no guarding or rebound.  Musculoskeletal:        General: No tenderness.     Cervical back: Neck supple.  Skin:    General: Skin is warm and dry.     Findings: No rash.  Neurological:     Mental Status: She is alert.     Cranial Nerves: No cranial nerve deficit (no facial droop, extraocular movements intact, no slurred speech).     Sensory: No sensory deficit.     Motor: No abnormal muscle tone or seizure activity.     Coordination: Coordination normal.     ED Results / Procedures / Treatments   Labs (all labs ordered are listed, but only abnormal results are displayed) Labs Reviewed  LACTIC ACID, PLASMA - Abnormal; Notable for  the following components:      Result Value   Lactic Acid, Venous 2.3 (*)    All other components within normal limits    COMPREHENSIVE METABOLIC PANEL - Abnormal; Notable for the following components:   Sodium 124 (*)    Chloride 79 (*)    CO2 34 (*)    Glucose, Bld 102 (*)    BUN 29 (*)    Creatinine, Ser 1.38 (*)    Calcium 8.3 (*)    Total Protein 5.3 (*)    Albumin 2.2 (*)    GFR, Estimated 38 (*)    All other components within normal limits  CBC WITH DIFFERENTIAL/PLATELET - Abnormal; Notable for the following components:   WBC 15.5 (*)    RBC 3.20 (*)    Hemoglobin 10.1 (*)    HCT 31.2 (*)    Neutro Abs 12.6 (*)    Monocytes Absolute 1.4 (*)    Abs Immature Granulocytes 0.09 (*)    All other components within normal limits  TROPONIN I (HIGH SENSITIVITY) - Abnormal; Notable for the following components:   Troponin I (High Sensitivity) 22 (*)    All other components within normal limits  TROPONIN I (HIGH SENSITIVITY) - Abnormal; Notable for the following components:   Troponin I (High Sensitivity) 21 (*)    All other components within normal limits  RESPIRATORY PANEL BY RT PCR (FLU A&B, COVID)  GROUP A STREP BY PCR  URINE CULTURE  CULTURE, BLOOD (ROUTINE X 2)  CULTURE, BLOOD (ROUTINE X 2)  LACTIC ACID, PLASMA  PROTIME-INR  APTT  URINALYSIS, ROUTINE W REFLEX MICROSCOPIC    EKG EKG Interpretation  Date/Time:  Monday April 20 2020 08:05:03 EDT Ventricular Rate:  72 PR Interval:    QRS Duration: 80 QT Interval:  405 QTC Calculation: 444 R Axis:   48 Text Interpretation: Sinus rhythm Probable left atrial enlargement Low voltage, extremity and precordial leads No significant change since last tracing Confirmed by Dorie Rank [id number]) on 05/11/2020 8:38:30 AM   Radiology DG Chest Port 1 View  Result Date: 05/04/2020 CLINICAL DATA:  Shortness of breath. EXAM: PORTABLE CHEST 1 VIEW COMPARISON:  April 07, 2020. FINDINGS: Stable cardiomediastinal silhouette. No pneumothorax is noted. Mild bibasilar subsegmental atelectasis is noted with probable small pleural effusions. Bony thorax is  unremarkable. IMPRESSION: Mild bibasilar subsegmental atelectasis with probable small pleural effusions. Aortic Atherosclerosis (ICD10-I70.0). Electronically Signed   By: Marijo Conception M.D.   On: 05/11/2020 08:53    Procedures .Critical Care Performed by: Dorie Rank, MD Authorized by: Dorie Rank, MD   Critical care provider statement:    Critical care time (minutes):  45   Critical care was time spent personally by me on the following activities:  Discussions with consultants, evaluation of patient's response to treatment, examination of patient, ordering and performing treatments and interventions, ordering and review of laboratory studies, ordering and review of radiographic studies, pulse oximetry, re-evaluation of patient's condition, obtaining history from patient or surrogate and review of old charts   (including critical care time)  Medications Ordered in ED Medications  lactated ringers infusion ( Intravenous New Bag/Given 05/19/2020 1007)  lactated ringers infusion (has no administration in time range)  lactated ringers bolus 1,000 mL (has no administration in time range)    And  lactated ringers bolus 1,000 mL (has no administration in time range)    And  lactated ringers bolus 250 mL (has no administration in time range)  ceFEPIme (MAXIPIME) 2 g  in sodium chloride 0.9 % 100 mL IVPB (has no administration in time range)  metroNIDAZOLE (FLAGYL) IVPB 500 mg (has no administration in time range)  vancomycin (VANCOCIN) IVPB 1000 mg/200 mL premix (has no administration in time range)  lactated ringers bolus 1,000 mL (0 mLs Intravenous Stopped 04/23/2020 1552)    ED Course  I have reviewed the triage vital signs and the nursing notes.  Pertinent labs & imaging results that were available during my care of the patient were reviewed by me and considered in my medical decision making (see chart for details).  Clinical Course as of Apr 20 1106  Mon Apr 20, 2020  0802 Patient's blood  pressure has improved.  Now 116/61 after IV fluid bolus.   [JK]  [id number] Initial troponin elevated but this is decreased from her recent elevated troponins from her non-STEMI   [JK]  0953 Sodium and chloride decreased but not significantly changed from previous values   [JK]  0953 Creatinine elevated compared to recent   [JK]  0953 Leukocytosis noted   [JK]  0954 Covid and flu test negative   [JK]  1103 Patient's initial lactic acid level was elevated.  With her initial hypotension will start on antibiotics.   [JK]  1105 Patient strep test is negative.   [MV]  3612 Chest x-ray without acute pneumonia.   [JK]    Clinical Course User Index [JK] Dorie Rank, MD   MDM Rules/Calculators/A&P                         DDx: Hypotension noted, ?sepsis although no fever, ?anemia, dehydration, recurrent mycocardial ischemia Sore throat, will check for strep, no sign of abscess, consider retropharyngeal abscess but unlikely based on presentation  Chest x-ray without definite pneumonia.  Laboratory tests do show a leukocytosis and elevated lactic acid level.  Patient has borderline blood pressures concerning for possible sepsis.  We will start the patient on empiric antibiotics.  30 cc/kg fluid bolus has been ordered urinalysis is still pending.  Will consult with the medical service for admission and further treatment.   Final Clinical Impression(s) / ED Diagnoses Final diagnoses:  Sepsis, due to unspecified organism, unspecified whether acute organ dysfunction present [facility])     Dorie Rank, MD 04/21/2020 1108

## 2020-04-20 NOTE — Progress Notes (Signed)
A consult was received from an ED physician for Cefepime, Vancomycin per pharmacy dosing.  The patient's profile has been reviewed for ht/wt/allergies/indication/available labs.   SCr 1.38 (baseline ~ 0.8) PCN, Ampicillin, Cephalosporin allergies - has tolerated IV cephs in the recent past. A one time order has been placed for Cefepime 2g IV, Vancomycin 1g IV.  Further antibiotics/pharmacy consults should be ordered by admitting physician if indicated.                       Thank you, Gretta Arab PharmD, BCPS Clinical Pharmacist [facility] main pharmacy [phone] 05/17/2020 11:21 AM

## 2020-04-20 NOTE — Progress Notes (Signed)
Patient arrives to room 1403 at this time via stretcher from ED.  Patient assisted to bed with assist of 3.  Patient oriented to callbell and bed controls with stated understanding

## 2020-04-20 NOTE — ED Notes (Signed)
Report called to [facility] on 4E.

## 2020-04-20 NOTE — Sepsis Progress Note (Signed)
Lactic acid trends 1.6 to 2.3 to 2.0. Had adequate fluid resucsitation for weight. Blood cx done .  Antibiotics given timely. BP soft. MD aware

## 2020-04-20 NOTE — Progress Notes (Signed)
Pharmacy Antibiotic Note  [name] is a 86 y.o. female admitted on 04/26/2020 with sepsis.  Pharmacy has been consulted for Cefepime and Vancomycin dosing. Afebrile, WBC 15.5, Lactic acid 2.3, SCr 1.38  Noted listed PCN, Ampicillin, Cephalosporin allergies - has tolerated IV cephs in the recent past.  Plan: Cefepime 2g IV q24h Vancomycin 1g IV x1 followed by 1g IV q36h. Measure Vanc levels as needed. Follow up renal function, culture results, and clinical course.   Height: 5\' 6"  (167.6 cm) Weight: 66.2 kg (146 lb) IBW/kg (Calculated) : 59.3  Temp (24hrs), Avg:97.9 F (36.6 C), Min:97.9 F (36.6 C), Max:97.9 F (36.6 C)  Recent Labs  Lab 04/14/20 0503 04/15/20 0439 04/16/20 0403 04/17/20 0419 05/02/2020 0748 05/15/2020 0948  WBC  --  10.1 9.3 7.5 15.5*  --   CREATININE 0.86 0.78 0.79 0.79 1.38*  --   LATICACIDVEN  --   --   --   --  1.6 2.3*    Estimated Creatinine Clearance: 27.9 mL/min (A) (by C-G formula based on SCr of 1.38 mg/dL (H)).    Allergies  Allergen Reactions  . Ampicillin Diarrhea and Nausea Only    GI upset  . Azithromycin Diarrhea and Nausea And Vomiting  . Codeine Nausea And Vomiting and Other (See Comments)    Makes her "crazy"  . Daliresp [Roflumilast] Nausea Only  . Nitrofurantoin Diarrhea and Nausea Only  . Other Other (See Comments)    No nuts and seeds because of diverticulitis  . Penicillins Nausea And Vomiting, Swelling and Rash    Has patient had a PCN reaction causing immediate rash, facial/tongue/throat swelling, SOB or lightheadedness with hypotension: Yes Has patient had a PCN reaction causing severe rash involving mucus membranes or skin necrosis: No Has patient had a PCN reaction that required hospitalization No Has patient had a PCN reaction occurring within the last 10 years: No No "cillins" If all of the above answers are "NO", then may proceed with Cephalosporin use.   . Sulfonamide Derivatives Diarrhea and Nausea And  Vomiting  . Tiotropium Bromide Monohydrate     Urinary retention  . Cephalosporins Nausea And Vomiting    IV seems to be ok  . Doxycycline Nausea Only  . Protonix [Pantoprazole] Nausea Only  . Fludrocortisone Acetate Itching    Tolerates prednisone  . Latex Itching and Rash    Antimicrobials this admission:  11/1 Cefepime 11/1 Vanc 11/1 Metronidazole  Dose adjustments this admission:    Microbiology results:  11/1 Group A Strep PCR: none detected 11/1 UCx: 11/1 BCx:  11/1 Resp panel:  Thank you for allowing pharmacy to be a part of this patient's care.  Gretta Arab PharmD, BCPS Clinical Pharmacist [facility] main pharmacy [phone] 04/24/2020 2:11 PM

## 2020-04-20 NOTE — Progress Notes (Signed)
Pharmacy Antibiotic Note  [name] is a 86 y.o. female admitted on 04/27/2020 with sepsis.  Pharmacy has been consulted for Cefepime and Vancomycin dosing. Afebrile, WBC 15.5, Lactic acid 2.3, SCr 1.38   Plan: Cefepime 2g IV q24h Vancomycin 1g IV x1 followed by 1g IV q36h. Measure Vanc levels as needed. Follow up renal function, culture results, and clinical course.   Height: 5\' 6"  (167.6 cm) Weight: 66.2 kg (146 lb) IBW/kg (Calculated) : 59.3  Temp (24hrs), Avg:97.9 F (36.6 C), Min:97.9 F (36.6 C), Max:97.9 F (36.6 C)  Recent Labs  Lab 04/14/20 0503 04/15/20 0439 04/16/20 0403 04/17/20 0419 04/26/2020 0748 05/03/2020 0948  WBC  --  10.1 9.3 7.5 15.5*  --   CREATININE 0.86 0.78 0.79 0.79 1.38*  --   LATICACIDVEN  --   --   --   --  1.6 2.3*    Estimated Creatinine Clearance: 27.9 mL/min (A) (by C-G formula based on SCr of 1.38 mg/dL (H)).    Allergies  Allergen Reactions  . Ampicillin Diarrhea and Nausea Only    GI upset  . Azithromycin Diarrhea and Nausea And Vomiting  . Codeine Nausea And Vomiting and Other (See Comments)    Makes her "crazy"  . Daliresp [Roflumilast] Nausea Only  . Nitrofurantoin Diarrhea and Nausea Only  . Other Other (See Comments)    No nuts and seeds because of diverticulitis  . Penicillins Nausea And Vomiting, Swelling and Rash    Has patient had a PCN reaction causing immediate rash, facial/tongue/throat swelling, SOB or lightheadedness with hypotension: Yes Has patient had a PCN reaction causing severe rash involving mucus membranes or skin necrosis: No Has patient had a PCN reaction that required hospitalization No Has patient had a PCN reaction occurring within the last 10 years: No No "cillins" If all of the above answers are "NO", then may proceed with Cephalosporin use.   . Sulfonamide Derivatives Diarrhea and Nausea And Vomiting  . Tiotropium Bromide Monohydrate     Urinary retention  . Cephalosporins Nausea And Vomiting     IV seems to be ok  . Doxycycline Nausea Only  . Protonix [Pantoprazole] Nausea Only  . Fludrocortisone Acetate Itching    Tolerates prednisone  . Latex Itching and Rash    Antimicrobials this admission:  11/1 Cefepime 11/1 Vanc 11/1 Metronidazole  Dose adjustments this admission:    Microbiology results:  11/1 Group A Strep PCR: none detected 11/1 UCx: 11/1 BCx:  11/1 Resp panel:  Thank you for allowing pharmacy to be a part of this patient's care.  Gretta Arab PharmD, BCPS Clinical Pharmacist [facility] main pharmacy [phone] 05/07/2020 1:55 PM

## 2020-04-20 NOTE — Consult Note (Addendum)
[facility] Surgery Consult Note  [name] 10/06/1932  [id number].    Requesting MD: Marva Panda Chief Complaint: chest tightness, sore throat, recent hospitalization for Pneumonia Reason for Consult: Possible SBO/sepsis Pulmonary:  Dr. Keturah Barre PCP:  Binnie Rail, MD   HPI: Patient is an 86 year old female who was recently hospitalized 10/19-10/29/2021, with a COPD exacerbation; on chronic O2 supplement, community-acquired pneumonia, 42-pack-year history tobacco use; quit 2001.  Hx alcohol abuse, hypothyroidism, a generalized tonic-clonic seizure thought to be related to alcohol withdrawal 04/07/2020, and NSTEMI 04/07/2020, with combined acute systolic congestive failure and diastolic congestive failure. Surgical history shows prior abdominal hysterectomy 1976, video-assisted thoracoscopy/thoracotomy for stage I non-small cell carcinoma on the right ~2007 per daughter, Dr. Marlyn Corporal.  On presentation to the ED via EMS from skilled nursing facility today she complained of chest tightness, a sore throat, sore all over, and was hypotensive.  Work-up in the ED shows she is afebrile, admission blood pressure 88/58, sats are 95% on nasal cannula.  Heart rate in the 70s.  Labs shows a potassium of 1.38, up from 0.79 on 04/17/2020, sodium 124, chloride of 79, potassium of 3.6.  Lactate 1.6>>2.3>>2.0.  WBC 15.5, hemoglobin 10.1, hematocrit 31.2, platelets 212,000.  INR 1.0, PTT 30 seconds.  Troponin 21.  Respiratory panel is negative.  Urinalysis negative.  Blood and urine cultures are pending.  Chest x-ray shows some mild bibasilar subsegmental atelectasis with probable pleural effusions, plain abdominal film shows dilated bowel loops concerning for distal SBO or ileus.  Patient was seen in the ED by Dr. Madelin Headings and found to have a tender abdomen and plain film was obtained to rule out possible perforation.  Dr. Maximino Sarin note is still pending.  Patient has been started on IV  Maxipime, Flagyl, and vancomycin.  We are asked to see.  ROS: Review of Systems  Constitutional: Positive for weight loss (unknown, daughter thinks it was 142, last week here.  Not really eating much at SNF).  HENT:       Tongue looks dry  Eyes: Negative.   Respiratory: Positive for cough (DRY, they used a suction  to clear in ED), shortness of breath (chronic, but worse now) and wheezing (upper air way wheezing ). Negative for hemoptysis and sputum production.        Rales in the bases Placed on Mucinex/prednisone yesterday at SNF  Cardiovascular: Positive for leg swelling (TRACE). Negative for orthopnea (SHE HAS NOT BEEN OOB SINCE HER TX TO SNF 04/17/20).       She complained of chest pain on admit,but pain now LUQ.  She had no complaints of chest pain with her NSTEMI  Gastrointestinal: Positive for abdominal pain (complaining of LUQ pain, I can't tell how long that has been an issue, she says since she got here), constipation (she doesn't know when she had her last BM, daughter thinks it was here in hospital, but also not sure) and heartburn (ongoing for some time). Negative for diarrhea, nausea and vomiting.       Remote hx of diverticulosis, no further Colonoscopy secondary to anesthesia risk.  Genitourinary:       Decreased output  Musculoskeletal: Positive for myalgias.       She says she cannot stand or walk, has been in bed at SNF since transfer on 04/17/20.  Left knee and left hip chronic issues.  Skin:       She has an abrasion/cellulitis left flank  Endo/Heme/Allergies: Bruises/bleeds easily.  Psychiatric/Behavioral: Positive  for substance abuse (daughter says she was using a fair amount of ETOH before her 04/07/20 admit).       Daughter says her mom was confused and very sleepy yesterday at SNF, trouble waking her up.     Family History  Problem Relation Age of Onset  . Heart disease Father   . Rheumatologic disease Brother   . Cancer Brother        Leukemia  .  Rheumatologic disease Sister   . Breast cancer Daughter     Past Medical History:  Diagnosis Date  . Allergic rhinitis   . Anxiety   . Arthritis    "back, hands; hips" (12/22/2014)  . Asthma   . Bladder atony   . Chronic bronchitis ([place])   . COPD (chronic obstructive pulmonary disease) ([place])   . Diarrhea   . Emphysema of lung ([place])   . GERD (gastroesophageal reflux disease)    "w/spicey foods" (12/22/2014)  . Hyperlipidemia   . Non-small cell carcinoma of lung, stage 1 ([place]) 2005   1.4 cm Poorly differentiated Squamous cell RUL  T1N0 resected 09/22/2003  . On home oxygen therapy    "3L q night and prn during the day" (12/22/2014)  . Pelvic cyst   . Pneumonia    "this is the 3rd time that I can remember" (12/22/2014)  . Unspecified hypothyroidism     Past Surgical History:  Procedure Laterality Date  . ABDOMINAL HYSTERECTOMY  ~ 1976  . BACK SURGERY    . CATARACT EXTRACTION W/ INTRAOCULAR LENS  IMPLANT, BILATERAL Bilateral ~ 2012  . DILATION AND CURETTAGE OF UTERUS    . LUMBAR DISC SURGERY  1970's  . LYMPH NODE DISSECTION  2005   "all my lymph nodes under breasts, went thru my back; Dr. Arlyce Dice did it"  . Needle aspiration Pelvic cyst  2011   Dr Diona Fanti  . VIDEO ASSISTED THORACOSCOPY (VATS)/THOROCOTOMY Right 09/2003   thoracotomy, right upper lobectomy with node dissection; Dr Demetrios Loll 11/02/2010  . VIDEO BRONCHOSCOPY  03/2004   Archie Endo 11/02/2010    Social History:  reports that she quit smoking about 16 years ago. Her smoking use included cigarettes. She has a 45.00 pack-year smoking history. She has never used smokeless tobacco. She reports current alcohol use of about 14.0 standard drinks of alcohol per week. No ETOH since 04/07/20.  Heavy use prior to that per daughter.  She reports that she does not use drugs.  Allergies:  Allergies  Allergen Reactions  . Ampicillin Diarrhea and Nausea Only    GI upset  . Azithromycin Diarrhea and Nausea And Vomiting  . Codeine  Nausea And Vomiting and Other (See Comments)    Makes her "crazy"  . Daliresp [Roflumilast] Nausea Only  . Nitrofurantoin Diarrhea and Nausea Only  . Other Other (See Comments)    No nuts and seeds because of diverticulitis  . Penicillins Nausea And Vomiting, Swelling and Rash    Has patient had a PCN reaction causing immediate rash, facial/tongue/throat swelling, SOB or lightheadedness with hypotension: Yes Has patient had a PCN reaction causing severe rash involving mucus membranes or skin necrosis: No Has patient had a PCN reaction that required hospitalization No Has patient had a PCN reaction occurring within the last 10 years: No No "cillins" If all of the above answers are "NO", then may proceed with Cephalosporin use.   . Sulfonamide Derivatives Diarrhea and Nausea And Vomiting  . Tiotropium Bromide Monohydrate     Urinary retention  .  Cephalosporins Nausea And Vomiting    IV seems to be ok  . Doxycycline Nausea Only  . Protonix [Pantoprazole] Nausea Only  . Fludrocortisone Acetate Itching    Tolerates prednisone  . Latex Itching and Rash    Prior to Admission medications   Medication Sig Start Date End Date Taking? Authorizing Provider  ALPRAZolam Duanne Moron) 1 MG tablet TAKE 1 TABLET BY MOUTH EVERYDAY AT BEDTIME 03/09/20   Binnie Rail, MD  aspirin 81 MG chewable tablet Chew 1 tablet (81 mg total) by mouth daily. 04/17/20   Dwyane Dee, MD  Cholecalciferol (VITAMIN D) 2000 UNITS tablet Take 1 tablet (2,000 Units total) by mouth daily. 04/29/15   Rowe Clack, MD  Ferrous Fumarate (HEMOCYTE - 106 MG FE) 324 (106 Fe) MG TABS tablet Take 1 tablet by mouth 3 (three) times a week.    [provider]  FLOVENT HFA 110 MCG/ACT inhaler TAKE 2 PUFFS BY MOUTH TWICE A DAY 01/21/20   Young, Tarri Fuller D, MD  furosemide (LASIX) 40 MG tablet Take 1 tablet (40 mg total) by mouth 2 (two) times daily. 04/17/20   Dwyane Dee, MD  guaiFENesin (MUCINEX) 600 MG 12 hr tablet Take 1  tablet (600 mg total) by mouth 2 (two) times daily as needed for to loosen phlegm. 04/17/20   Dwyane Dee, MD  levalbuterol Penne Lash) 1.25 MG/3ML nebulizer solution Take 1.25 mg by nebulization every 6 (six) hours as needed for wheezing. DX: Bronchiectasis 09/27/19   Baird [place] D, MD  levothyroxine (SYNTHROID) 100 MCG tablet Take 1 tablet (100 mcg total) by mouth daily before breakfast. 04/17/20   Dwyane Dee, MD  losartan (COZAAR) 25 MG tablet Take 1 tablet (25 mg total) by mouth daily. 04/17/20   Dwyane Dee, MD  Multiple Vitamins-Minerals (CENTRUM SILVER PO) Take 1 tablet by mouth daily.     [provider]  ondansetron (ZOFRAN) 4 MG tablet Take 1 tablet (4 mg total) by mouth 3 (three) times daily as needed for nausea or vomiting. 03/26/20   Janith Lima, MD  predniSONE (DELTASONE) 20 MG tablet Take 2 tablets (40 mg total) by mouth daily with breakfast for 2 days. 04/18/20 05/05/2020  Dwyane Dee, MD  Probiotic Product (PROBIOTIC PO) Take 1 capsule by mouth daily.    [provider]  Respiratory Therapy Supplies (FLUTTER) DEVI Blow through 4 times per cycle and repeat 3 cycles per day 10/15/13   Baird [place] D, MD  rosuvastatin (CRESTOR) 20 MG tablet Take 1 tablet (20 mg total) by mouth daily. 04/17/20   Dwyane Dee, MD     Blood pressure (!) 106/59, pulse 76, temperature 97.9 F (36.6 C), temperature source Oral, resp. rate (!) 22, height 5\' 6"  (1.676 m), weight 66.2 kg, SpO2 (!) 81 %. Physical Exam:  General: pleasant, chronically ill WF, c/o SOB, cold, LUQ pain HEENT: head is normocephalic, atraumatic.  Sclera are noninjected.  PERRL.  Ears and nose without any masses or lesions. Tongue is dry Heart: regular, rate, and rhythm.  Normal s1,s2. No obvious murmurs, gallops, or rubs noted.  Palpable radial pulsed, no palpable distal pulses, cannot get O2 sat on fingers Lungs: bilateral basilar rales, congested upper airway sounds, some wheezing.  Respiratory  effort stable on O2 cannot get sats off her fingers Abd: mildly distended, tender LUQ, +BS high pitched, ? Mass LUQ,, hernias, or organomegaly.  Hysterectomy scar MS: cannot get O2 sat on fingers, decreased pulses upper extremities, no palpable pulse lower extremities, trace edema lower  legs. Skin: skin abrasion left flank, with possible cellulitis Neuro: Cranial nerves 2-12 grossly intact, sensation is normal throughout Psych: A&Ox3, appears chronically ill, but answering questions as well as she can.  Results for orders placed or performed during the hospital encounter of 04/29/2020 (from the past 48 hour(s))  Lactic acid, plasma     Status: None   Collection Time: 05/14/2020  7:48 AM  Result Value Ref Range   Lactic Acid, Venous 1.6 0.5 - 1.9 mmol/L    Comment: Performed at [facility], [place] [address] [place], [place] [address]  Comprehensive metabolic panel     Status: Abnormal   Collection Time: 04/28/2020  7:48 AM  Result Value Ref Range   Sodium 124 (L) 135 - 145 mmol/L   Potassium 3.6 3.5 - 5.1 mmol/L   Chloride 79 (L) 98 - 111 mmol/L   CO2 34 (H) 22 - 32 mmol/L   Glucose, Bld 102 (H) 70 - 99 mg/dL    Comment: Glucose reference range applies only to samples taken after fasting for at least 8 hours.   BUN 29 (H) 8 - 23 mg/dL   Creatinine, Ser 1.38 (H) 0.44 - 1.00 mg/dL   Calcium 8.3 (L) 8.9 - 10.3 mg/dL   Total Protein 5.3 (L) 6.5 - 8.1 g/dL   Albumin 2.2 (L) 3.5 - 5.0 g/dL   AST 40 15 - 41 U/L   ALT 30 0 - 44 U/L   Alkaline Phosphatase 126 38 - 126 U/L   Total Bilirubin 0.7 0.3 - 1.2 mg/dL   GFR, Estimated 38 (L) >60 mL/min    Comment: (NOTE) Calculated using the CKD-EPI Creatinine Equation (2021)    Anion gap 11 5 - 15    Comment: Performed at [facility] [place] [address], [place], [place] [address]  CBC WITH DIFFERENTIAL     Status: Abnormal   Collection Time: 04/21/2020  7:48 AM  Result Value Ref Range   WBC 15.5 (H) 4.0 - 10.5 K/uL    RBC 3.20 (L) 3.87 - 5.11 MIL/uL   Hemoglobin 10.1 (L) 12.0 - 15.0 g/dL   HCT 31.2 (L) 36 - 46 %   MCV 97.5 80.0 - 100.0 fL   MCH 31.6 26.0 - 34.0 pg   MCHC 32.4 30.0 - 36.0 g/dL   RDW 13.8 11.5 - 15.5 %   Platelets 212 150 - 400 K/uL   nRBC 0.0 0.0 - 0.2 %   Neutrophils Relative % 81 %   Neutro Abs 12.6 (H) 1.7 - 7.7 K/uL   Lymphocytes Relative 9 %   Lymphs Abs 1.4 0.7 - 4.0 K/uL   Monocytes Relative 9 %   Monocytes Absolute 1.4 (H) 0.1 - 1.0 K/uL   Eosinophils Relative 0 %   Eosinophils Absolute 0.0 0.0 - 0.5 K/uL   Basophils Relative 0 %   Basophils Absolute 0.0 0.0 - 0.1 K/uL   Immature Granulocytes 1 %   Abs Immature Granulocytes 0.09 (H) 0.00 - 0.07 K/uL    Comment: Performed at [facility], [place] [address], [place], [place] [address]  Protime-INR     Status: None   Collection Time: 04/22/2020  7:48 AM  Result Value Ref Range   Prothrombin Time 13.0 11.4 - 15.2 seconds   INR 1.0 0.8 - 1.2    Comment: (NOTE) INR goal varies based on device and disease states. Performed at [facility], [place] [address], [place], [place] [address]  APTT     Status: None   Collection Time: 05/13/2020  7:48 AM  Result Value Ref Range   aPTT 30 24 - 36 seconds    Comment: Performed at [facility], [place] [address] [place], [place] [address]  Troponin I (High Sensitivity)     Status: Abnormal   Collection Time: 04/24/2020  7:48 AM  Result Value Ref Range   Troponin I (High Sensitivity) 22 (H) <18 ng/L    Comment: (NOTE) Elevated high sensitivity troponin I (hsTnI) values and significant  changes across serial measurements may suggest ACS but many other  chronic and acute conditions are known to elevate hsTnI results.  Refer to the "Links" section for chest pain algorithms and additional  guidance. Performed at [facility], [place] [address], [place], [place] [address]   Respiratory Panel by RT PCR (Flu A&B, Covid) -  Nasopharyngeal Swab     Status: None   Collection Time: 04/26/2020  8:50 AM   Specimen: Nasopharyngeal Swab  Result Value Ref Range   SARS Coronavirus 2 by RT PCR NEGATIVE NEGATIVE    Comment: (NOTE) SARS-CoV-2 target nucleic acids are NOT DETECTED.  The SARS-CoV-2 RNA is generally detectable in upper respiratoy specimens during the acute phase of infection. The lowest concentration of SARS-CoV-2 viral copies this assay can detect is 131 copies/mL. A negative result does not preclude SARS-Cov-2 infection and should not be used as the sole basis for treatment or other patient management decisions. A negative result may occur with  improper specimen collection/handling, submission of specimen other than nasopharyngeal swab, presence of viral mutation(s) within the areas targeted by this assay, and inadequate number of viral copies (<131 copies/mL). A negative result must be combined with clinical observations, patient history, and epidemiological information. The expected result is Negative.  Fact Sheet for Patients:  PinkCheek.be  Fact Sheet for Healthcare Providers:  GravelBags.it  This test is no t yet approved or cleared by the [place] FDA and  has been authorized for detection and/or diagnosis of SARS-CoV-2 by FDA under an Emergency Use Authorization (EUA). This EUA will remain  in effect (meaning this test can be used) for the duration of the COVID-19 declaration under Section 564(b)(1) of the Act, 21 U.S.C. section 360bbb-3(b)(1), unless the authorization is terminated or revoked sooner.     Influenza A by PCR NEGATIVE NEGATIVE   Influenza B by PCR NEGATIVE NEGATIVE    Comment: (NOTE) The Xpert Xpress SARS-CoV-2/FLU/RSV assay is intended as an aid in  the diagnosis of influenza from Nasopharyngeal swab specimens and  should not be used as a sole basis for treatment. Nasal washings and  aspirates are  unacceptable for Xpert Xpress SARS-CoV-2/FLU/RSV  testing.  Fact Sheet for Patients: PinkCheek.be  Fact Sheet for Healthcare Providers: GravelBags.it  This test is not yet approved or cleared by the [place] FDA and  has been authorized for detection and/or diagnosis of SARS-CoV-2 by  FDA under an Emergency Use Authorization (EUA). This EUA will remain  in effect (meaning this test can be used) for the duration of the  Covid-19 declaration under Section 564(b)(1) of the Act, 21  U.S.C. section 360bbb-3(b)(1), unless the authorization is  terminated or revoked. Performed at [facility], [place] [address], [place], [place] [address]   Group A Strep by PCR     Status: None   Collection Time: 04/21/2020  8:58 AM  Result Value Ref Range   Group A Strep by  PCR NOT DETECTED NOT DETECTED    Comment: Performed at [facility], [place] [address], [place], [place] [address]  Lactic acid, plasma     Status: Abnormal   Collection Time: 05/05/2020  9:48 AM  Result Value Ref Range   Lactic Acid, Venous 2.3 (HH) 0.5 - 1.9 mmol/L    Comment: CRITICAL RESULT CALLED TO, READ BACK BY AND VERIFIED WITH: DAWD,P,RN@1045  05/15/2020 P.HENDERSON Performed at [facility], [place] [address], [place], [place] [address]   Troponin I (High Sensitivity)     Status: Abnormal   Collection Time: 05/16/2020  9:49 AM  Result Value Ref Range   Troponin I (High Sensitivity) 21 (H) <18 ng/L    Comment: (NOTE) Elevated high sensitivity troponin I (hsTnI) values and significant  changes across serial measurements may suggest ACS but many other  chronic and acute conditions are known to elevate hsTnI results.  Refer to the "Links" section for chest pain algorithms and additional  guidance. Performed at [facility], [place] [address], [place], [place] [address]   Urinalysis, Routine w reflex  microscopic Urine, Catheterized     Status: Abnormal   Collection Time: 05/18/2020 12:37 PM  Result Value Ref Range   Color, Urine YELLOW YELLOW   APPearance HAZY (A) CLEAR   Specific Gravity, Urine 1.008 1.005 - 1.030   pH 6.0 5.0 - 8.0   Glucose, UA NEGATIVE NEGATIVE mg/dL   Hgb urine dipstick NEGATIVE NEGATIVE   Bilirubin Urine NEGATIVE NEGATIVE   Ketones, ur NEGATIVE NEGATIVE mg/dL   Protein, ur NEGATIVE NEGATIVE mg/dL   Nitrite NEGATIVE NEGATIVE   Leukocytes,Ua NEGATIVE NEGATIVE    Comment: Performed at [place] [address], [place], [place] [address]  Lactic acid, plasma     Status: Abnormal   Collection Time: 05/13/2020  1:32 PM  Result Value Ref Range   Lactic Acid, Venous 2.0 (HH) 0.5 - 1.9 mmol/L    Comment: CRITICAL VALUE NOTED.  VALUE IS CONSISTENT WITH PREVIOUSLY REPORTED AND CALLED VALUE. Performed at [facility], [place] [address] [place],  [address]    DG Chest Port 1 View  Result Date: 05/13/2020 CLINICAL DATA:  Shortness of breath. EXAM: PORTABLE CHEST 1 VIEW COMPARISON:  April 07, 2020. FINDINGS: Stable cardiomediastinal silhouette. No pneumothorax is noted. Mild bibasilar subsegmental atelectasis is noted with probable small pleural effusions. Bony thorax is unremarkable. IMPRESSION: Mild bibasilar subsegmental atelectasis with probable small pleural effusions. Aortic Atherosclerosis (ICD10-I70.0). Electronically Signed   By: Marijo Conception M.D.   On: 05/01/2020 08:53   DG Abd Portable 1V  Result Date: 04/22/2020 CLINICAL DATA:  Generalized abdominal pain. EXAM: PORTABLE ABDOMEN - 1 VIEW COMPARISON:  None. FINDINGS: Mildly dilated small bowel loops are noted concerning for distal small bowel obstruction or ileus. No colonic dilatation is noted. No radio-opaque calculi or other significant radiographic abnormality are seen. IMPRESSION: Mildly dilated small bowel loops are noted concerning for distal small bowel  obstruction or ileus. Electronically Signed   By: Marijo Conception M.D.   On: 05/16/2020 14:34   Antibiotics Given (last 72 hours)    Date/Time Action Medication Dose Rate   05/12/2020 1131 New Bag/Given   ceFEPIme (MAXIPIME) 2 g in sodium chloride 0.9 % 100 mL IVPB 2 g 200 mL/hr   05/10/2020 1153 New Bag/Given   vancomycin (VANCOCIN) IVPB 1000 mg/200 mL premix 1,000 mg 200 mL/hr   04/24/2020 1342 New Bag/Given   metroNIDAZOLE (FLAGYL)  IVPB 500 mg 500 mg 100 mL/hr       Assessment/Plan Severe COPD; O2 dependent Hx non-small cell carcinoma with right thoracotomy/lobectomy Hx alcohol abuse Hx tonic-clonic seizure; possibly alcohol-related 04/07/2020 Hx NSTEMI; 40/98/1191 Combined systolic diastolic/failure; EF 47-82/NFAOZ 1 diastolic dysfunction(Echo 04/07/20) Hypothyroid DNR No surgery secondary to pulmonary status Remote hx of diverticulosis  Abdominal pain LUQ, uncertain time period; SBO vs ileus Hypotenson Sepsis -of uncertain etiology ARF - creatinine 0.79(10/29)>>1.38(11/1)  FEN:  NPO/IV fluids ID:  Maxipime/Flagyl/vancomycin Follow up:  TBD  Plan:  I have talked with Dr. Neysa Bonito, her respiratory status is better than this AM.  She is very tender in the LUQ, BS high pitched, unknown date of last BM.  She has a abrasion/cellulitis left flank.  Elevated lactate.  I will order a CT abd/pelvis with oral contrast, no IV contrast, he is getting a CT of the chest.  We will review with Dr. Marcello Moores and follow with you.      Earnstine Regal [facility] Surgery 05/09/2020, 2:55 PM Please see Amion for pager number during day hours 7:00am-4:30pm

## 2020-04-20 NOTE — Progress Notes (Signed)
Report received from ED 

## 2020-04-20 NOTE — Progress Notes (Signed)
Started po contrast @ 2010 and finished @ 2020

## 2020-04-20 NOTE — ED Triage Notes (Addendum)
Patient BIBA from [facility] facility c/o sore throat and chest tightness. D/c recently for pneumonia. Was hypotensive at 86/42 upon EMS arrival. Had neb treatment last night, refused neb treatment this morning. 200 ml NS    99/51 P 77 CBG 131 T 97.3 97% 3 L [place] (baseline)

## 2020-04-21 ENCOUNTER — Inpatient Hospital Stay: Payer: Self-pay

## 2020-04-21 DIAGNOSIS — N179 Acute kidney failure, unspecified: Secondary | ICD-10-CM | POA: Diagnosis not present

## 2020-04-21 DIAGNOSIS — A419 Sepsis, unspecified organism: Secondary | ICD-10-CM | POA: Diagnosis present

## 2020-04-21 DIAGNOSIS — K5720 Diverticulitis of large intestine with perforation and abscess without bleeding: Secondary | ICD-10-CM

## 2020-04-21 DIAGNOSIS — I5042 Chronic combined systolic (congestive) and diastolic (congestive) heart failure: Secondary | ICD-10-CM | POA: Diagnosis present

## 2020-04-21 DIAGNOSIS — R6520 Severe sepsis without septic shock: Secondary | ICD-10-CM

## 2020-04-21 DIAGNOSIS — J9621 Acute and chronic respiratory failure with hypoxia: Secondary | ICD-10-CM

## 2020-04-21 DIAGNOSIS — J439 Emphysema, unspecified: Secondary | ICD-10-CM | POA: Diagnosis present

## 2020-04-21 DIAGNOSIS — K5732 Diverticulitis of large intestine without perforation or abscess without bleeding: Secondary | ICD-10-CM

## 2020-04-21 DIAGNOSIS — J431 Panlobular emphysema: Secondary | ICD-10-CM

## 2020-04-21 DIAGNOSIS — R1084 Generalized abdominal pain: Secondary | ICD-10-CM

## 2020-04-21 DIAGNOSIS — J90 Pleural effusion, not elsewhere classified: Secondary | ICD-10-CM

## 2020-04-21 LAB — BASIC METABOLIC PANEL
Anion gap: 10 (ref 5–15)
BUN: 23 mg/dL (ref 8–23)
CO2: 30 mmol/L (ref 22–32)
Calcium: 7.4 mg/dL — ABNORMAL LOW (ref 8.9–10.3)
Chloride: 89 mmol/L — ABNORMAL LOW (ref 98–111)
Creatinine, Ser: 1.02 mg/dL — ABNORMAL HIGH (ref 0.44–1.00)
GFR, Estimated: 54 mL/min — ABNORMAL LOW (ref >60)
Glucose, Bld: 142 mg/dL — ABNORMAL HIGH (ref 70–99)
Potassium: 3.3 mmol/L — ABNORMAL LOW (ref 3.5–5.1)
Sodium: 129 mmol/L — ABNORMAL LOW (ref 135–145)

## 2020-04-21 LAB — URINE CULTURE: Culture: NO GROWTH

## 2020-04-21 LAB — RESPIRATORY PANEL BY PCR

## 2020-04-21 LAB — CBC
HCT: 27.3 % — ABNORMAL LOW (ref 36.0–46.0)
Hemoglobin: 8.8 g/dL — ABNORMAL LOW (ref 12.0–15.0)
MCH: 31.3 pg (ref 26.0–34.0)
MCHC: 32.2 g/dL (ref 30.0–36.0)
MCV: 97.2 fL (ref 80.0–100.0)
Platelets: 195 10*3/uL (ref 150–400)
RBC: 2.81 MIL/uL — ABNORMAL LOW (ref 3.87–5.11)
RDW: 13.8 % (ref 11.5–15.5)
WBC: 10.5 10*3/uL (ref 4.0–10.5)
nRBC: 0 % (ref 0.0–0.2)

## 2020-04-21 LAB — GLUCOSE, CAPILLARY
Glucose-Capillary: 171 mg/dL — ABNORMAL HIGH (ref 70–99)
Glucose-Capillary: 170 mg/dL — ABNORMAL HIGH (ref 70–99)
Glucose-Capillary: 164 mg/dL — ABNORMAL HIGH (ref 70–99)
Glucose-Capillary: 155 mg/dL — ABNORMAL HIGH (ref 70–99)

## 2020-04-21 LAB — MAGNESIUM: Magnesium: 1.9 mg/dL (ref 1.7–2.4)

## 2020-04-21 LAB — PHOSPHORUS: Phosphorus: 3.7 mg/dL (ref 2.5–4.6)

## 2020-04-21 MED ORDER — ALBUTEROL SULFATE (2.5 MG/3ML) 0.083% IN NEBU: 2.5000 mg | INHALATION_SOLUTION | Freq: Four times a day (QID) | RESPIRATORY_TRACT | Status: DC | PRN

## 2020-04-21 MED ORDER — VANCOMYCIN HCL IN DEXTROSE 1-5 GM/200ML-% IV SOLN
1000.0000 mg | INTRAVENOUS | Status: DC
  Administered 2020-04-21: 1000 mg via INTRAVENOUS
  Filled 2020-04-21: qty 200

## 2020-04-21 MED ORDER — SODIUM CHLORIDE 0.9% FLUSH
10.0000 mL | INTRAVENOUS | Status: DC | PRN
  Administered 2020-04-25: 10 mL

## 2020-04-21 MED ORDER — MORPHINE SULFATE (PF) 2 MG/ML IV SOLN
1.0000 mg | Freq: Four times a day (QID) | INTRAVENOUS | Status: DC | PRN
Start: 2020-04-21 — End: 2020-04-21

## 2020-04-21 MED ORDER — IPRATROPIUM-ALBUTEROL 0.5-2.5 (3) MG/3ML IN SOLN
3.0000 mL | Freq: Four times a day (QID) | RESPIRATORY_TRACT | Status: DC
  Administered 2020-04-22: 3 mL via RESPIRATORY_TRACT
  Filled 2020-04-21: qty 3

## 2020-04-21 MED ORDER — ALBUMIN HUMAN 25 % IV SOLN
25.0000 g | Freq: Once | INTRAVENOUS | Status: AC
  Administered 2020-04-22: 25 g via INTRAVENOUS
  Filled 2020-04-21: qty 100

## 2020-04-21 MED ORDER — SODIUM CHLORIDE 0.9 % IV SOLN
2.0000 g | Freq: Two times a day (BID) | INTRAVENOUS | Status: DC
  Administered 2020-04-21 – 2020-04-24 (×7): 2 g via INTRAVENOUS
  Filled 2020-04-21: qty 0.13
  Filled 2020-04-21 (×2): qty 2
  Filled 2020-04-21: qty 0.13
  Filled 2020-04-21 (×4): qty 2

## 2020-04-21 MED ORDER — CHLORHEXIDINE GLUCONATE CLOTH 2 % EX PADS
6.0000 | MEDICATED_PAD | Freq: Every day | CUTANEOUS | Status: DC
  Administered 2020-04-21 – 2020-04-24 (×4): 6 via TOPICAL

## 2020-04-21 MED ORDER — POTASSIUM CHLORIDE 10 MEQ/100ML IV SOLN
10.0000 meq | INTRAVENOUS | Status: AC
  Administered 2020-04-21 (×2): 10 meq via INTRAVENOUS
  Filled 2020-04-21: qty 100

## 2020-04-21 MED ORDER — DEXTROSE-NACL 5-0.9 % IV SOLN: INTRAVENOUS | Status: DC

## 2020-04-21 MED ORDER — METHYLPREDNISOLONE SODIUM SUCC 40 MG IJ SOLR
40.0000 mg | Freq: Two times a day (BID) | INTRAMUSCULAR | Status: DC
  Administered 2020-04-21 – 2020-04-22 (×2): 40 mg via INTRAVENOUS
  Filled 2020-04-21 (×2): qty 1

## 2020-04-21 MED ORDER — POTASSIUM CHLORIDE 10 MEQ/100ML IV SOLN
10.0000 meq | INTRAVENOUS | Status: AC
  Administered 2020-04-21 (×3): 10 meq via INTRAVENOUS
  Filled 2020-04-21 (×4): qty 100

## 2020-04-21 MED ORDER — DIPHENHYDRAMINE HCL 50 MG/ML IJ SOLN
12.5000 mg | Freq: Two times a day (BID) | INTRAMUSCULAR | Status: DC | PRN
  Administered 2020-04-21 – 2020-04-24 (×4): 12.5 mg via INTRAVENOUS
  Filled 2020-04-21 (×4): qty 1

## 2020-04-21 NOTE — Evaluation (Signed)
Physical Therapy Evaluation Patient Details Name: RAIZEL WESOLOWSKI MRN: [id number] DOB: [birth year] Today's Date: 04/21/2020   History of Present Illness  86 year old female with a past medical history of chronic hypoxic respiratory failure on 3 L/min, chronic hyponatremia COPD, hyperlipidemia, combined systolic and diastolic heart failure, hypothyroidism who was recently admitted from 10/19-10/29 with multiple medical issues including alcohol withdrawal seizure, NSTEMI (troponin up to 34,000), COPD exacerbation, secondary to CAP and hyponatremia who was discharged to SNF and presents today with complaints of sore throat and chest tightness. Dx of diverticulitis  Clinical Impression  Pt admitted with above diagnosis. Mod assist to pivot to recliner, pt fatigues very quickly. ST-SNF recommended.  Pt currently with functional limitations due to the deficits listed below (see PT Problem List). Pt will benefit from skilled PT to increase their independence and safety with mobility to allow discharge to the venue listed below.       Follow Up Recommendations SNF    Equipment Recommendations  None recommended by PT    Recommendations for Other Services       Precautions / Restrictions Precautions Precautions: Fall Precaution Comments: hx falls, monitor O2, R LE weakness Restrictions Weight Bearing Restrictions: No      Mobility  Bed Mobility Overal bed mobility: Needs Assistance Bed Mobility: Supine to Sit     Supine to sit: Max assist;HOB elevated Sit to supine: Mod assist   General bed mobility comments: cues to utilize bed rail and max A for LE management and trunk control to sit up at EOB. Pt reported dizziness in sitting, BP 110/50.    Transfers Overall transfer level: Needs assistance Equipment used: Rolling walker (2 wheeled) Transfers: Sit to/from [facility] Sit to Stand: Mod assist;From elevated surface Stand pivot transfers: Mod assist        General transfer comment: assist to rise, VCs hand placement, mod A to pivot  Ambulation/Gait                Stairs            Wheelchair Mobility    Modified Rankin (Stroke Patients Only)       Balance Overall balance assessment: Needs assistance;History of Falls Sitting-balance support: Feet supported;No upper extremity supported Sitting balance-Leahy Scale: Fair     Standing balance support: Bilateral upper extremity supported Standing balance-Leahy Scale: Poor Standing balance comment: reliant on upper extremity support                             Pertinent Vitals/Pain Pain Assessment: Faces Faces Pain Scale: Hurts little more Pain Location: abdomen Pain Descriptors / Indicators: Tightness Pain Intervention(s): Limited activity within patient's tolerance;Repositioned;Monitored during session    Home Living Family/patient expects to be discharged to:: Skilled nursing facility Living Arrangements: Spouse/significant other Available Help at Discharge: Available 24 hours/day;Family Type of Home: House Home Access: Stairs to enter;Ramped entrance   Entrance Stairs-Number of Steps: 3 Home Layout: One level Home Equipment: Wheelchair - manual;Grab bars - tub/shower;Grab bars - toilet;Bedside commode;Shower seat Additional Comments: had a fall in june and predominantly just pivots for transfers since. spouse pushes wc.    Prior Function Level of Independence: Needs assistance   Gait / Transfers Assistance Needed: just transfers  ADL's / Homemaking Assistance Needed: Husband assists with dressing, bathing, toileting, performs sponge bath  Comments: daughter checks in on parents daily     Hand Dominance   Dominant Hand: Right  Extremity/Trunk Assessment   Upper Extremity Assessment Upper Extremity Assessment: Generalized weakness    Lower Extremity Assessment Lower Extremity Assessment: Generalized weakness (R knee ext -4/5, L knee  ext 4/5)    Cervical / Trunk Assessment Cervical / Trunk Assessment: Kyphotic  Communication   Communication: No difficulties  Cognition Arousal/Alertness: Awake/alert Behavior During Therapy: WFL for tasks assessed/performed Overall Cognitive Status: Within Functional Limits for tasks assessed                                 General Comments: A/O x4      General Comments General comments (skin integrity, edema, etc.): O2 88-90% seated EOB on 4L    Exercises     Assessment/Plan    PT Assessment Patient needs continued PT services  PT Problem List Decreased strength;Decreased range of motion;Decreased activity tolerance;Decreased balance;Decreased mobility;Decreased knowledge of use of DME;Decreased safety awareness       PT Treatment Interventions DME instruction;Gait training;Functional mobility training;Therapeutic activities;Therapeutic exercise;Balance training;Patient/family education    PT Goals (Current goals can be found in the Care Plan section)  Acute Rehab PT Goals Patient Stated Goal: back to SNF when able PT Goal Formulation: With patient/family Time For Goal Achievement: 05/05/20 Potential to Achieve Goals: Fair    Frequency Min 2X/week   Barriers to discharge        Co-evaluation               AM-PAC PT "6 Clicks" Mobility  Outcome Measure Help needed turning from your back to your side while in a flat bed without using bedrails?: A Lot Help needed moving from lying on your back to sitting on the side of a flat bed without using bedrails?: A Lot Help needed moving to and from a bed to a chair (including a wheelchair)?: A Lot Help needed standing up from a chair using your arms (e.g., wheelchair or bedside chair)?: A Lot Help needed to walk in hospital room?: Total Help needed climbing 3-5 steps with a railing? : Total 6 Click Score: 10    End of Session Equipment Utilized During Treatment: Gait belt;Oxygen Activity Tolerance:  Patient limited by fatigue Patient left: in chair;with call bell/phone within reach;with chair alarm set Nurse Communication: Mobility status PT Visit Diagnosis: Unsteadiness on feet (R26.81);Muscle weakness (generalized) (M62.81);Difficulty in walking, not elsewhere classified (R26.2)    Time: 2229-7989 PT Time Calculation (min) (ACUTE ONLY): 22 min   Charges:   PT Evaluation $PT Eval Moderate Complexity: 1 Mod         Philomena Doheny PT 04/21/2020  Acute Rehabilitation Services Pager [phone] Office [phone]

## 2020-04-21 NOTE — Progress Notes (Addendum)
I have tried multiple times to reach her medical POA, [name], via her phone listed in [place] [phone] to no avail. Left VM that we had been trying to reach her. I was able to reach her son, Timmothy Sours. He has been seeing her here as well. He was planning to attempt to get a hold of [place] as well.   He mentioned her known history of diverticulitis before  She is currently stable following some resuscitation with IV fluid. Afebrile. Normotensive without any vasopressor. She has stable, potentially 'improved' LLQ discomfort relative to 2 days ago.  NAD, comfortable RRR Abdomen is soft mildly ttp along left abdomen without rebound nor guarding. Mildly distended.  WBC has since normalized Cr improving  We reviewed her CT findings - namely moderate pneumoperitoneum in setting of likely source being diverticulitis. No large volume of free fluid.  -We have reviewed options going forward - currently is responding appopriately to therapy. Should she worsen over next 12-48 hours, surgery would be a major undertaking for her to undergo that would almost certainly involve a colostomy... and given extensive cardiac and pulmonary history would be a very high risk endeavor for her to undergo and carry major morbidity and mortality risks. Hopefully she continues to improve -Continue IV abx -Monitor closely, serial abdominal exams -Timmothy Sours is reaching out to [place] and I told him our service will be back by in a few hours on AM rounds and would call her again at that time  Nadeen Landau, M.D. [facility] Use [email or web address] to contact on call provider

## 2020-04-21 NOTE — Evaluation (Signed)
Occupational Therapy Evaluation Patient Details Name: [name] MRN: [id number] DOB: 02-Jan-1933 Today's Date: 04/21/2020    History of Present Illness 86 year old female with a past medical history of chronic hypoxic respiratory failure on 3 L/min, chronic hyponatremia COPD, hyperlipidemia, combined systolic and diastolic heart failure, hypothyroidism who was recently admitted from 10/19-10/29 with multiple medical issues including alcohol withdrawal seizure, NSTEMI (troponin up to 34,000), COPD exacerbation, secondary to CAP and hyponatremia who was discharged to SNF and presents today with complaints of sore throat and chest tightness. Dx of diverticulitis   Clinical Impression   Patient with functional deficits listed below impacting safety and independence with self care. Patient's DTR reports patient had not started with therapy prior to return to hospital from SNF. Patient with limited mobility since June, pivot transferring with assist from family to w/c, toilet, ect. Currently patient max A to sit up at EOB with cues for sequencing. Patient sat up ~8 mins however reports ongoing dizziness with O2 88-90% on 4L. Patient mod A x2 to scoot up towards [facility] then mod A to lift LEs back to supine. Recommend continued acute OT services in order to facilitate to D/C venue listed below.    Follow Up Recommendations  SNF    Equipment Recommendations  None recommended by OT       Precautions / Restrictions Precautions Precautions: Fall Precaution Comments: hx falls, monitor O2, R LE weakness Restrictions Weight Bearing Restrictions: No      Mobility Bed Mobility Overal bed mobility: Needs Assistance Bed Mobility: Supine to Sit;Sit to Supine     Supine to sit: Max assist;HOB elevated Sit to supine: Mod assist   General bed mobility comments: cues to utilize bed rail and max A for LE management and trunk control to sit up at EOB. patient mod A x2 to scoot up to head of bed before  returning to supine with mod A to lift LEs into bed. patient sat EOB ~8 mins with dizziness and declined standing    Transfers                 General transfer comment: deferred due to dizziness at EOB "I don't think I could stand"    Balance Overall balance assessment: Needs assistance;History of Falls Sitting-balance support: Single extremity supported;Feet supported Sitting balance-Leahy Scale: Poor                                     ADL either performed or assessed with clinical judgement   ADL Overall ADL's : Needs assistance/impaired Eating/Feeding: Set up;Bed level   Grooming: Set up;Sitting;Oral care Grooming Details (indicate cue type and reason): with oral swab Upper Body Bathing: Moderate assistance;Set up;Bed level   Lower Body Bathing: Set up;Maximal assistance;Bed level   Upper Body Dressing : Moderate assistance;Set up;Bed level   Lower Body Dressing: Total assistance;Bed level     Toilet Transfer Details (indicate cue type and reason): deferred, patient reports feeling dizzy and not up to standing at this time Toileting- Water quality scientist and Hygiene: Total assistance;Bed level       Functional mobility during ADLs: Maximal assistance General ADL Comments: patient requiring increased assistance with self care due to decreased activity tolerance, balance, safety, strength and pain with mobility                   Pertinent Vitals/Pain Pain Assessment: Faces Faces Pain Scale: Hurts little more  Pain Location: abdomen Pain Descriptors / Indicators: Tightness Pain Intervention(s): Monitored during session     Hand Dominance Right   Extremity/Trunk Assessment Upper Extremity Assessment Upper Extremity Assessment: Generalized weakness   Lower Extremity Assessment Lower Extremity Assessment: Defer to PT evaluation   Cervical / Trunk Assessment Cervical / Trunk Assessment: Kyphotic   Communication  Communication Communication: No difficulties   Cognition Arousal/Alertness: Awake/alert Behavior During Therapy: WFL for tasks assessed/performed Overall Cognitive Status: Within Functional Limits for tasks assessed                                 General Comments: A/O x4   General Comments  O2 88-90% seated EOB on 4L            Home Living Family/patient expects to be discharged to:: Skilled nursing facility Living Arrangements: Spouse/significant other Available Help at Discharge: Available 24 hours/day;Family Type of Home: House Home Access: Stairs to enter;Ramped entrance Entrance Stairs-Number of Steps: 3   Home Layout: One level     Bathroom Shower/Tub: Tub/shower unit         Home Equipment: Wheelchair - manual;Grab bars - tub/shower;Grab bars - toilet;Bedside commode;Shower seat   Additional Comments: had a fall in june and predominantly just pivots for transfers since. spouse pushes wc.      Prior Functioning/Environment Level of Independence: Needs assistance  Gait / Transfers Assistance Needed: just transfers ADL's / Homemaking Assistance Needed: Husband assists with dressing, bathing, toileting, performs sponge bath   Comments: daughter checks in on parents daily        OT Problem List: Decreased strength;Decreased activity tolerance;Impaired balance (sitting and/or standing);Decreased knowledge of use of DME or AE;Pain      OT Treatment/Interventions: Self-care/ADL training;Therapeutic exercise;DME and/or AE instruction;Therapeutic activities;Patient/family education;Balance training    OT Goals(Current goals can be found in the care plan section) Acute Rehab OT Goals Patient Stated Goal: back to SNF when able OT Goal Formulation: With patient/family Time For Goal Achievement: 05/05/20 Potential to Achieve Goals: Good  OT Frequency: Min 2X/week    AM-PAC OT "6 Clicks" Daily Activity     Outcome Measure Help from another person  eating meals?: A Little Help from another person taking care of personal grooming?: A Little Help from another person toileting, which includes using toliet, bedpan, or urinal?: Total Help from another person bathing (including washing, rinsing, drying)?: A Lot Help from another person to put on and taking off regular upper body clothing?: A Lot Help from another person to put on and taking off regular lower body clothing?: Total 6 Click Score: 12   End of Session Equipment Utilized During Treatment: Oxygen Nurse Communication: Mobility status  Activity Tolerance: Patient limited by fatigue Patient left: in bed;with call bell/phone within reach;with bed alarm set;with family/visitor present  OT Visit Diagnosis: Unsteadiness on feet (R26.81);Muscle weakness (generalized) (M62.81)                Time: 7341-9379 OT Time Calculation (min): 25 min Charges:  OT General Charges $OT Visit: 1 Visit OT Evaluation $OT Eval Low Complexity: 1 Low OT Treatments $Self Care/Home Management : 8-22 mins  Delbert Phenix OT OT pager: [phone]  Rosemary Holms 04/21/2020, 11:46 AM

## 2020-04-21 NOTE — Progress Notes (Signed)
Diverticulitis  Subjective: C/o being SOB, denies abd pain or nausea  Objective: Vital signs in last 24 hours: Temp:  [97.5 F (36.4 C)] 97.5 F (36.4 C) (11/02 0416) Pulse Rate:  [70-77] 77 (11/02 0416) Resp:  [18-23] 20 (11/02 0416) BP: (90-116)/(47-61) 103/53 (11/02 0416) SpO2:  [81 %-99 %] 94 % (11/02 0823) Weight:  [71 kg] 71 kg (11/02 0426) Last BM Date: 05/11/2020  Intake/Output from previous day: 11/01 0701 - 11/02 0700 In: 2693.9 [I.V.:1244.7; IV Piggyback:1449.2] Out: 900 [Urine:900] Intake/Output this shift: No intake/output data recorded.  General appearance: alert and cooperative GI: normal findings: soft, non-tender  Lab Results:  Results for orders placed or performed during the hospital encounter of 05/07/2020 (from the past 24 hour(s))  Group A Strep by PCR     Status: None   Collection Time: 05/02/2020  8:58 AM  Result Value Ref Range   Group A Strep by PCR NOT DETECTED NOT DETECTED  Blood Culture (routine x 2)     Status: None (Preliminary result)   Collection Time: 05/05/2020  9:17 AM   Specimen: BLOOD  Result Value Ref Range   Specimen Description      BLOOD RIGHT ANTECUBITAL Performed at [facility], [place] [address], [place], [place] [address]    Special Requests      BOTTLES DRAWN AEROBIC AND ANAEROBIC Blood Culture results may not be optimal due to an excessive volume of blood received in culture bottles Performed at [place] [address], [place], [place] [address]    Culture      NO GROWTH < 24 HOURS Performed at [facility] [place] [address], [place],  [address]    Report Status PENDING   Lactic acid, plasma     Status: Abnormal   Collection Time: 05/01/2020  9:48 AM  Result Value Ref Range   Lactic Acid, Venous 2.3 (HH) 0.5 - 1.9 mmol/L  Troponin I (High Sensitivity)     Status: Abnormal   Collection Time: 04/27/2020  9:49 AM  Result Value Ref Range   Troponin I (High Sensitivity) 21 (H)  <18 ng/L  Urinalysis, Routine w reflex microscopic Urine, Catheterized     Status: Abnormal   Collection Time: 05/09/2020 12:37 PM  Result Value Ref Range   Color, Urine YELLOW YELLOW   APPearance HAZY (A) CLEAR   Specific Gravity, Urine 1.008 1.005 - 1.030   pH 6.0 5.0 - 8.0   Glucose, UA NEGATIVE NEGATIVE mg/dL   Hgb urine dipstick NEGATIVE NEGATIVE   Bilirubin Urine NEGATIVE NEGATIVE   Ketones, ur NEGATIVE NEGATIVE mg/dL   Protein, ur NEGATIVE NEGATIVE mg/dL   Nitrite NEGATIVE NEGATIVE   Leukocytes,Ua NEGATIVE NEGATIVE  Lactic acid, plasma     Status: Abnormal   Collection Time: 05/10/2020  1:32 PM  Result Value Ref Range   Lactic Acid, Venous 2.0 (HH) 0.5 - 1.9 mmol/L  MRSA PCR Screening     Status: None   Collection Time: 04/23/2020  5:45 PM   Specimen: Nasal Mucosa; Nasopharyngeal  Result Value Ref Range   MRSA by PCR NEGATIVE NEGATIVE  Basic metabolic panel     Status: Abnormal   Collection Time: 05/17/2020  7:21 PM  Result Value Ref Range   Sodium 128 (L) 135 - 145 mmol/L   Potassium 3.4 (L) 3.5 - 5.1 mmol/L   Chloride 84 (L) 98 - 111 mmol/L   CO2 32 22 - 32 mmol/L   Glucose, Bld 101 (H)  70 - 99 mg/dL   BUN 24 (H) 8 - 23 mg/dL   Creatinine, Ser 1.14 (H) 0.44 - 1.00 mg/dL   Calcium 8.1 (L) 8.9 - 10.3 mg/dL   GFR, Estimated 47 (L) >60 mL/min   Anion gap 12 5 - 15  Lactic acid, plasma     Status: None   Collection Time: 05/04/2020  7:21 PM  Result Value Ref Range   Lactic Acid, Venous 1.2 0.5 - 1.9 mmol/L  Respiratory Panel by PCR     Status: None   Collection Time: 05/01/2020  9:20 PM  Result Value Ref Range   Adenovirus NOT DETECTED NOT DETECTED   Coronavirus 229E NOT DETECTED NOT DETECTED   Coronavirus HKU1 NOT DETECTED NOT DETECTED   Coronavirus NL63 NOT DETECTED NOT DETECTED   Coronavirus OC43 NOT DETECTED NOT DETECTED   Metapneumovirus NOT DETECTED NOT DETECTED   Rhinovirus / Enterovirus NOT DETECTED NOT DETECTED   Influenza A NOT DETECTED NOT DETECTED   Influenza B  NOT DETECTED NOT DETECTED   Parainfluenza Virus 1 NOT DETECTED NOT DETECTED   Parainfluenza Virus 2 NOT DETECTED NOT DETECTED   Parainfluenza Virus 3 NOT DETECTED NOT DETECTED   Parainfluenza Virus 4 NOT DETECTED NOT DETECTED   Respiratory Syncytial Virus NOT DETECTED NOT DETECTED   Bordetella pertussis NOT DETECTED NOT DETECTED   Chlamydophila pneumoniae NOT DETECTED NOT DETECTED   Mycoplasma pneumoniae NOT DETECTED NOT DETECTED  Basic metabolic panel     Status: Abnormal   Collection Time: 04/21/20  2:19 AM  Result Value Ref Range   Sodium 129 (L) 135 - 145 mmol/L   Potassium 3.3 (L) 3.5 - 5.1 mmol/L   Chloride 89 (L) 98 - 111 mmol/L   CO2 30 22 - 32 mmol/L   Glucose, Bld 142 (H) 70 - 99 mg/dL   BUN 23 8 - 23 mg/dL   Creatinine, Ser 1.02 (H) 0.44 - 1.00 mg/dL   Calcium 7.4 (L) 8.9 - 10.3 mg/dL   GFR, Estimated 54 (L) >60 mL/min   Anion gap 10 5 - 15  CBC     Status: Abnormal   Collection Time: 04/21/20  2:19 AM  Result Value Ref Range   WBC 10.5 4.0 - 10.5 K/uL   RBC 2.81 (L) 3.87 - 5.11 MIL/uL   Hemoglobin 8.8 (L) 12.0 - 15.0 g/dL   HCT 27.3 (L) 36 - 46 %   MCV 97.2 80.0 - 100.0 fL   MCH 31.3 26.0 - 34.0 pg   MCHC 32.2 30.0 - 36.0 g/dL   RDW 13.8 11.5 - 15.5 %   Platelets 195 150 - 400 K/uL   nRBC 0.0 0.0 - 0.2 %  Glucose, capillary     Status: Abnormal   Collection Time: 04/21/20  7:19 AM  Result Value Ref Range   Glucose-Capillary 171 (H) 70 - 99 mg/dL     Studies/Results Radiology     MEDS, Scheduled . aspirin  81 mg Oral Daily  . heparin  5,000 Units Subcutaneous Q8H  . ipratropium-albuterol  3 mL Nebulization TID  . levothyroxine  100 mcg Oral Q0600  . methylPREDNISolone (SOLU-MEDROL) injection  125 mg Intravenous Q6H   Followed by  . [START ON 04/22/2020] predniSONE  40 mg Oral Q breakfast  . mometasone-formoterol  1 puff Inhalation BID  . rosuvastatin  20 mg Oral Daily  . sodium chloride flush  3 mL Intravenous Q12H      Assessment: Diverticulitis with perforation   Plan: Cont  IV abx.  Cont NPO for now.  Pt is a poor surgical candidate  LOS: 1 day    Rosario Adie, MD [facility], [place]    04/21/2020 8:57 AM

## 2020-04-21 NOTE — TOC Initial Note (Addendum)
Transition of Care [facility]) - Initial/Assessment Note    Patient Details  Name: [name] MRN: [id number] Date of Birth: 18-Jun-1933  Transition of Care [facility]) CM/SW Contact:    Dessa Phi, RN Phone Number: 04/21/2020, 12:41 PM  Clinical Narrative:  Damaris Schooner to dtr(Cindy) about d/c plans-she is too exhausted currently to decide the plans-PT recc SNF.Cindy doesn't want patient back to [facility]-will await palliative recc.                  Barriers to Discharge: Continued Medical Work up   Patient Goals and CMS Choice        Expected Discharge Plan and Services           Expected Discharge Date:  (unknown)                                    Prior Living Arrangements/Services                       Activities of Daily Living Home Assistive Devices/Equipment: Eyeglasses, Wheelchair, Environmental consultant (specify type), Nebulizer, Oxygen (front wheeled walker) ADL Screening (condition at time of admission) Patient's cognitive ability adequate to safely complete daily activities?: Yes Is the patient deaf or have difficulty hearing?: No Does the patient have difficulty seeing, even when wearing glasses/contacts?: No Does the patient have difficulty concentrating, remembering, or making decisions?: No Patient able to express need for assistance with ADLs?: Yes Does the patient have difficulty dressing or bathing?: Yes Independently performs ADLs?: No Communication: Independent Dressing (OT): Needs assistance Is this a change from baseline?: Change from baseline, expected to last <3days Grooming: Needs assistance Is this a change from baseline?: Change from baseline, expected to last <3 days Feeding: Independent Is this a change from baseline?: Pre-admission baseline Bathing: Needs assistance Is this a change from baseline?: Change from baseline, expected to last <3 days Toileting: Needs assistance Is this a change from baseline?: Pre-admission baseline In/Out  Bed: Needs assistance Is this a change from baseline?: Pre-admission baseline Walks in Home: Needs assistance Is this a change from baseline?: Pre-admission baseline Does the patient have difficulty walking or climbing stairs?: Yes Weakness of Legs: Both Weakness of Arms/Hands: None  Permission Sought/Granted                  Emotional Assessment              Admission diagnosis:  Failure to thrive in adult [R62.7] Abdominal pain [R10.9] Sepsis, due to unspecified organism, unspecified whether acute organ dysfunction present [facility]) [A41.9] Patient Active Problem List   Diagnosis Date Noted  . Diverticulitis of colon 04/21/2020  . Failure to thrive in adult 04/26/2020  . Chronic combined systolic (congestive) and diastolic (congestive) heart failure ([place]) 04/26/2020  . AKI (acute kidney injury) ([place]) 05/03/2020  . Acute combined systolic (congestive) and diastolic (congestive) heart failure ([place]) 04/15/2020  . Encounter for assessment of decision-making capacity 04/15/2020  . Hyponatremia 04/07/2020  . Alcohol abuse 04/07/2020  . Sialadenitis 03/26/2020  . Arm wound, left, initial encounter 01/15/2020  . Open knee wound, right, initial encounter 01/15/2020  . Diarrhea 01/01/2020  . Blood in stool 01/01/2020  . Left lower quadrant abdominal pain 01/01/2020  . Dark stools 11/05/2019  . GERD (gastroesophageal reflux disease) 11/05/2019  . Hypomagnesemia 10/14/2019  . Skin tear of left upper arm without complication 95/62/1308  . COPD with acute  exacerbation ([place]) 05/22/2019  . Urinary incontinence due to immobility 05/22/2019  . Non-healing wound of lower extremity 05/03/2019  . Multiple skin tears 03/02/2019  . Weight loss 11/20/2018  . Depression 11/20/2018  . Recurrent falls 11/20/2018  . Parotiditis 09/17/2018  . Primary osteoarthritis of right hip 06/21/2018  . Degenerative disc disease, cervical 12/06/2017  . DOE (dyspnea on exertion) 11/21/2017  . Bilateral  swelling of feet 11/02/2017  . Osteopenia 02/16/2017  . Hyperglycemia 12/08/2016  . Degenerative arthritis of right knee 10/27/2016  . 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 Greater trochanteric bursitis of both hips 04/08/2016  . Pain in both knees 04/08/2016  . Chronic midline low back pain without sciatica 04/08/2016  . Chronic respiratory failure with hypoxia ([place]) 03/07/2015  . Diverticulitis 05/24/2014  . Anxiety   . Bladder prolapse, female, acquired   . Non-small cell carcinoma of lung, stage 1 ([place]) 01/10/2012  . Neuropathy, peripheral 01/28/2011  . COPD mixed type ([place]) 09/08/2010  . Dyslipidemia   . ATONY OF BLADDER 08/10/2009  . Hypothyroidism 03/12/2008  . Seasonal allergic rhinitis 10/10/2007   PCP:  Binnie Rail, MD Pharmacy:   [id number] - [place], [place] [address] Phone: [phone] Fax: [phone]  [place] [place] [place] [address] Phone: [phone] Fax: [phone]     Social Determinants of Health (SDOH) Interventions    Readmission Risk Interventions Readmission Risk Prevention Plan 04/21/2020  Transportation Screening Complete  Medication Review Press photographer) Complete  HRI or Home Care Consult Complete  SW Recovery Care/Counseling Consult Complete  Palliative Care Screening Complete  Skilled Nursing Facility Complete  Some recent data might be hidden

## 2020-04-21 NOTE — Progress Notes (Signed)
PROGRESS NOTE    [name]  HKV:[id number] DOB: 1933/02/11 DOA: 05/10/2020 PCP: Binnie Rail, MD     Brief Narrative:  86 year old WF PMHx COPD/Chronic Hypoxic Respiratory failure on 3 L/min, chronic hyponatremia HLD, Combined Systolic and Diastolic CHF, Hypothyroidism who was recently admitted from 10/19-10/29 with multiple medical issues including alcohol withdrawal seizure, NSTEMI (troponin up to 34,000), COPD exacerbation, secondary to CAP and hyponatremia who was discharged to SNF   Presents today with complaints of sore throat and chest tightness.  Patient was noted to be hypotensive by EMS 86/42 which improved to 99/51 upon arrival after fluids.  Daughter at bedside states that the patient has seen much more lethargic over the past couple days and has been very sedentary at the facility since it has been the weekend..  She is also not been eating or drinking much  When I visited the patient at the bedside she continued to complain of sore throat and hoarse voice and felt generalized myalgias and overall malaise.  She complained of abdominal pain and distention as well.  Throughout the day she has also been short of breath, wheezing and with a minimally productive cough.  ED Course: Afebrile, hypotensive at 88/58 which improved with some fluids but still soft pressures in the 90s to low 100s, requiring up to 5 LPM.  Notable labs: Sodium 124, chloride 79, CO2 34, BUN 29, creatinine 1.38, troponin flat at 20x2, lactic acid 1.6-> 2.3-> 2.0, WBC 15.5, Hb 10.1.  Throat swab negative for strep, COVID-19 and flu negative   Subjective: Afebrile overnight patient states hurts all over.  When questioned further positive abdominal pain, negative nausea, negative vomiting, negative diarrhea.  Patient does not know her dry weight.   Assessment & Plan: Covid vaccination; vaccinated   Active Problems:   Chronic respiratory failure with hypoxia ([place])   COPD with acute exacerbation  (HCC)   Left lower quadrant abdominal pain   Hyponatremia   Failure to thrive in adult   Chronic combined systolic (congestive) and diastolic (congestive) heart failure (HCC)   AKI (acute kidney injury) ([place])   Diverticulitis of colon   Severe sepsis (HCC)   Diverticulitis of colon with perforation   COPD with emphysema (HCC)   Pleural effusion   Systolic and diastolic CHF, chronic (HCC)  Severe sepsis/Diverticulitis with Acute perforation sigmoid/distal descending colon -On admission met criteria for severe sepsis RR> 20, WBC> 12,000, lactic acid> 2, site of infection intestines (diverticulitis) -Continue cefepime and metronidazole -11/2 discontinue vancomycin MRSA by PCR negative -Trend lactic acid -Trend procalcitonin -Continue n.p.o. -D5-0.9% saline 2ml/hr -Per CCS poor surgical candidate. -Given patient's age and other comorbidities survival tentative at best.  COPD/Emphysema/Bilateral pleural effusion -Continue current antibiotics -DuoNeb QID(patient states she is unsure of the adverse effect listed in her allergy list) -Albuterol nebulizer QID PRN -Flutter valve -Incentive spirometry -Solu-Medrol 40 mg BID  -Benadryl IV 12.5 mg PRN pruritus  Chronic Systolic and Diastolic CHF/recent NSTEMI -10/19 EF 45 to 50% -Patient does not know her base weight, and does not weigh herself daily -Strict in and out -Daily weight -Currently no complaint of chest pressure or pain.  Essential HTN -Presented hypotensive -Hold all BP medication -11/2 Albumin 25 g  Hypokalemia -Potassium goal> 4 -11/2 Potassium IV 50 mEq -Repeat K/Mg/PO4 @ 1500   Acute on chronic hyponatremia (baseline Na~132) -Follow closely, most likely secondary to CHF, and poor nutritional intake. Lab Results  Component Value Date   NA 129 (L) 04/21/2020   NA  128 (L) 05/16/2020   NA 124 (L) 05/10/2020   NA 132 (L) 04/17/2020   NA 133 (L) 04/16/2020    AKI -Likely secondary to hemodynamic changes,  ARB and poor p.o. intake Lab Results  Component Value Date   CREATININE 1.02 (H) 04/21/2020   CREATININE 1.14 (H) 04/23/2020   CREATININE 1.38 (H) 04/26/2020   CREATININE 0.79 04/17/2020   CREATININE 0.79 04/16/2020  -Trending down  Sore throat of unknown etiology -11/2 no complaints -Respiratory virus panel negative  Goals of care -11/2 palliative care consult; Patient with multisystem organ failure frequent hospital visits with deteriorating condition.  Consider goals of care discussion.  Hospice? -11/2 request for PICC line placement     DVT prophylaxis: Subcu heparin Code Status: DNR Family Communication: 11/2 husband and daughter at bedside for discussion of plan of care answered all questions Status is: Inpatient    Dispo: The patient is from: Home              Anticipated d/c is to: Hospice?              Anticipated d/c date is:??              Patient currently extremely unstable      Consultants:  PCCM Surgery Dr. Nadeen Landau   Procedures/Significant Events:  10/19 echocardiogram;Left Ventricle: LVEF=45 to 50%. left ventricle demonstrates global hypokinesis.  -Grade I diastolic dysfunction  ----------------------------------------------------------------------------------------------------------------------------------  11/1 CT abdomen pelvis W0 contrast; Moderate volume pneumoperitoneum likely arising from a perforated acute sigmoid/distal descending colon diverticulitis. Associated small volume simple free fluid. No associated organized fluid collection. - Diffuse at least moderate subcutaneus soft tissue edema. 11/1 CT chest W0 contrast;Left lower lobe consolidation and opacification of left lower lobe bronchi, favor atelectasis. -Bilateral pleural effusion LEFT >> RIGHT Small bilateral pleural effusions, left greater than right. -Emphysema  .    I have personally reviewed and interpreted all radiology studies and my findings are as  above.  VENTILATOR SETTINGS: Nasal cannula 11/2 Flow 4 L/min SPO2 94%   Cultures 11/1 SARS coronavirus negative 11/1 influenza A/B negative 11/1 respiratory virus panel negative 11/1 MRSA by PCR negative     Antimicrobials: Anti-infectives (From admission, onward)   Start     Ordered Stop   04/22/20 1000  vancomycin (VANCOCIN) IVPB 1000 mg/200 mL premix  Status:  Discontinued        05/17/2020 1453 04/21/20 1246   04/21/20 2200  ceFEPIme (MAXIPIME) 2 g in sodium chloride 0.9 % 100 mL IVPB        04/21/20 1246     04/21/20 1400  vancomycin (VANCOCIN) IVPB 1000 mg/200 mL premix        04/21/20 1246     04/21/20 1000  ceFEPIme (MAXIPIME) 2 g in sodium chloride 0.9 % 100 mL IVPB  Status:  Discontinued        05/05/2020 1453 04/21/20 1246   05/02/2020 2200  metroNIDAZOLE (FLAGYL) IVPB 500 mg        04/21/2020 1340     04/23/2020 1345  ceFEPIme (MAXIPIME) 2 g in sodium chloride 0.9 % 100 mL IVPB  Status:  Discontinued        05/08/2020 1340 04/23/2020 1454   05/06/2020 1345  vancomycin (VANCOCIN) IVPB 1000 mg/200 mL premix  Status:  Discontinued        04/28/2020 1340 04/26/2020 1454   05/18/2020 1115  ceFEPIme (MAXIPIME) 2 g in sodium chloride 0.9 % 100 mL IVPB  05/17/2020 1105 04/28/2020 1156   04/24/2020 1115  metroNIDAZOLE (FLAGYL) IVPB 500 mg        05/12/2020 1105 04/22/2020 1442   05/08/2020 1115  vancomycin (VANCOCIN) IVPB 1000 mg/200 mL premix        05/11/2020 1105 04/24/2020 1253       Devices    LINES / TUBES:      Continuous Infusions: . albumin human    . ceFEPime (MAXIPIME) IV    . dextrose 5 % and 0.9% NaCl    . metronidazole Stopped (04/21/20 2055)  . potassium chloride 10 mEq (04/21/20 2253)     Objective: Vitals:   04/21/20 1337 04/21/20 1429 04/21/20 2023 04/21/20 2024  BP: (!) 114/59   (!) 119/52  Pulse: 89   82  Resp: 16   18  Temp: 97.7 F (36.5 C)   97.7 F (36.5 C)  TempSrc: Oral   Oral  SpO2: 91% 92% 90% 90%  Weight:      Height:        Intake/Output  Summary (Last 24 hours) at 04/21/2020 2303 Last data filed at 04/21/2020 1833 Gross per 24 hour  Intake 1161.06 ml  Output 1300 ml  Net -138.94 ml   Filed Weights   05/10/2020 0743 04/21/20 0426  Weight: 66.2 kg 71 kg    Examination:  General: A/O x4, positive acute on chronic respiratory distress Eyes: negative scleral hemorrhage, negative anisocoria, negative icterus ENT: Negative Runny nose, negative gingival bleeding, Neck:  Negative scars, masses, torticollis, lymphadenopathy, JVD Lungs: decreased breath sounds bibasilar without wheezes or crackles Cardiovascular: Regular rate and rhythm without murmur gallop or rub normal S1 and S2 Abdomen: Positive diffuse abdominal pain with palpation, nondistended, positive soft, bowel sounds, no rebound, no ascites, no appreciable mass Extremities: No significant cyanosis, clubbing, or edema bilateral lower extremities Skin: Negative rashes, lesions, ulcers Psychiatric:  Negative depression, positive anxiety, negative fatigue, negative mania  Central nervous system:  Cranial nerves II through XII intact, tongue/uvula midline, all extremities muscle strength 5/5, sensation intact throughout, negative dysarthria, negative expressive aphasia, negative receptive aphasia.  .     Data Reviewed: Care during the described time interval was provided by me .  I have reviewed this patient's available data, including medical history, events of note, physical examination, and all test results as part of my evaluation.  CBC: Recent Labs  Lab 04/15/20 0439 04/16/20 0403 04/17/20 0419 05/14/2020 0748 04/21/20 0219  WBC 10.1 9.3 7.5 15.5* 10.5  NEUTROABS  --  7.5 5.8 12.6*  --   HGB 9.9* 9.9* 9.6* 10.1* 8.8*  HCT 31.4* 31.0* 29.4* 31.2* 27.3*  MCV 100.0 99.4 97.7 97.5 97.2  PLT 186 181 161 212 101   Basic Metabolic Panel: Recent Labs  Lab 04/16/20 0403 04/17/20 0419 04/30/2020 0748 05/02/2020 1921 04/21/20 0219  NA 133* 132* 124* 128* 129*  K  3.3* 3.5 3.6 3.4* 3.3*  CL 82* 81* 79* 84* 89*  CO2 43* 42* 34* 32 30  GLUCOSE 92 93 102* 101* 142*  BUN 14 14 29* 24* 23  CREATININE 0.79 0.79 1.38* 1.14* 1.02*  CALCIUM 8.7* 8.5* 8.3* 8.1* 7.4*  MG 2.0 2.1  --   --  1.9  PHOS  --   --   --   --  3.7   GFR: Estimated Creatinine Clearance: 37.7 mL/min (A) (by C-G formula based on SCr of 1.02 mg/dL (H)). Liver Function Tests: Recent Labs  Lab 04/15/20 0439 04/16/20 0403 04/17/20 0419 05/07/2020  0748  AST 54* 42* 43* 40  ALT 46* 38 39 30  ALKPHOS 103 99 106 126  BILITOT 0.8 0.8 0.7 0.7  PROT 5.3* 5.1* 5.1* 5.3*  ALBUMIN 2.5* 2.2* 2.1* 2.2*   No results for input(s): LIPASE, AMYLASE in the last 168 hours. No results for input(s): AMMONIA in the last 168 hours. Coagulation Profile: Recent Labs  Lab 05/19/2020 0748  INR 1.0   Cardiac Enzymes: No results for input(s): CKTOTAL, CKMB, CKMBINDEX, TROPONINI in the last 168 hours. BNP (last 3 results) No results for input(s): PROBNP in the last 8760 hours. HbA1C: No results for input(s): HGBA1C in the last 72 hours. CBG: Recent Labs  Lab 04/21/20 0719 04/21/20 1133 04/21/20 1631 04/21/20 2021  GLUCAP 171* 170* 164* 155*   Lipid Profile: No results for input(s): CHOL, HDL, LDLCALC, TRIG, CHOLHDL, LDLDIRECT in the last 72 hours. Thyroid Function Tests: No results for input(s): TSH, T4TOTAL, FREET4, T3FREE, THYROIDAB in the last 72 hours. Anemia Panel: No results for input(s): VITAMINB12, FOLATE, FERRITIN, TIBC, IRON, RETICCTPCT in the last 72 hours. Sepsis Labs: Recent Labs  Lab 04/22/2020 0748 05/08/2020 0948 05/17/2020 1332 05/03/2020 1921  LATICACIDVEN 1.6 2.3* 2.0* 1.2    Recent Results (from the past 240 hour(s))  SARS Coronavirus 2 by RT PCR (hospital order, performed in [facility] hospital lab) Nasopharyngeal Nasopharyngeal Swab     Status: None   Collection Time: 04/16/20 11:00 PM   Specimen: Nasopharyngeal Swab  Result Value Ref Range Status   SARS Coronavirus 2  NEGATIVE NEGATIVE Final    Comment: (NOTE) SARS-CoV-2 target nucleic acids are NOT DETECTED.  The SARS-CoV-2 RNA is generally detectable in upper and lower respiratory specimens during the acute phase of infection. The lowest concentration of SARS-CoV-2 viral copies this assay can detect is 250 copies / mL. A negative result does not preclude SARS-CoV-2 infection and should not be used as the sole basis for treatment or other patient management decisions.  A negative result may occur with improper specimen collection / handling, submission of specimen other than nasopharyngeal swab, presence of viral mutation(s) within the areas targeted by this assay, and inadequate number of viral copies (<250 copies / mL). A negative result must be combined with clinical observations, patient history, and epidemiological information.  Fact Sheet for Patients:   StrictlyIdeas.no  Fact Sheet for Healthcare Providers: BankingDealers.co.za  This test is not yet approved or  cleared by the [place] FDA and has been authorized for detection and/or diagnosis of SARS-CoV-2 by FDA under an Emergency Use Authorization (EUA).  This EUA will remain in effect (meaning this test can be used) for the duration of the COVID-19 declaration under Section 564(b)(1) of the Act, 21 U.S.C. section 360bbb-3(b)(1), unless the authorization is terminated or revoked sooner.  Performed at [facility], [place] [address] [place], [place] [address]   Blood Culture (routine x 2)     Status: None (Preliminary result)   Collection Time: 04/26/2020  7:48 AM   Specimen: BLOOD  Result Value Ref Range Status   Specimen Description   Final    BLOOD RIGHT ANTECUBITAL Performed at [facility] [address] [place], [place] [address]    Special Requests   Final    BOTTLES DRAWN AEROBIC AND ANAEROBIC Blood Culture results may not be optimal  due to an excessive volume of blood received in culture bottles Performed at [facility] [address] [place], [place] [address]  Culture   Final    NO GROWTH < 24 HOURS Performed at [facility], [place] [address], [place], [place] [address]    Report Status PENDING  Incomplete  Respiratory Panel by RT PCR (Flu A&B, Covid) - Nasopharyngeal Swab     Status: None   Collection Time: 05/08/2020  8:50 AM   Specimen: Nasopharyngeal Swab  Result Value Ref Range Status   SARS Coronavirus 2 by RT PCR NEGATIVE NEGATIVE Final    Comment: (NOTE) SARS-CoV-2 target nucleic acids are NOT DETECTED.  The SARS-CoV-2 RNA is generally detectable in upper respiratoy specimens during the acute phase of infection. The lowest concentration of SARS-CoV-2 viral copies this assay can detect is 131 copies/mL. A negative result does not preclude SARS-Cov-2 infection and should not be used as the sole basis for treatment or other patient management decisions. A negative result may occur with  improper specimen collection/handling, submission of specimen other than nasopharyngeal swab, presence of viral mutation(s) within the areas targeted by this assay, and inadequate number of viral copies (<131 copies/mL). A negative result must be combined with clinical observations, patient history, and epidemiological information. The expected result is Negative.  Fact Sheet for Patients:  PinkCheek.be  Fact Sheet for Healthcare Providers:  GravelBags.it  This test is no t yet approved or cleared by the [place] FDA and  has been authorized for detection and/or diagnosis of SARS-CoV-2 by FDA under an Emergency Use Authorization (EUA). This EUA will remain  in effect (meaning this test can be used) for the duration of the COVID-19 declaration under Section 564(b)(1) of the Act, 21 U.S.C. section 360bbb-3(b)(1), unless the  authorization is terminated or revoked sooner.     Influenza A by PCR NEGATIVE NEGATIVE Final   Influenza B by PCR NEGATIVE NEGATIVE Final    Comment: (NOTE) The Xpert Xpress SARS-CoV-2/FLU/RSV assay is intended as an aid in  the diagnosis of influenza from Nasopharyngeal swab specimens and  should not be used as a sole basis for treatment. Nasal washings and  aspirates are unacceptable for Xpert Xpress SARS-CoV-2/FLU/RSV  testing.  Fact Sheet for Patients: PinkCheek.be  Fact Sheet for Healthcare Providers: GravelBags.it  This test is not yet approved or cleared by the [place] FDA and  has been authorized for detection and/or diagnosis of SARS-CoV-2 by  FDA under an Emergency Use Authorization (EUA). This EUA will remain  in effect (meaning this test can be used) for the duration of the  Covid-19 declaration under Section 564(b)(1) of the Act, 21  U.S.C. section 360bbb-3(b)(1), unless the authorization is  terminated or revoked. Performed at [facility], [place] [address] [place], [place] [address]   Group A Strep by PCR     Status: None   Collection Time: 04/26/2020  8:58 AM  Result Value Ref Range Status   Group A Strep by PCR NOT DETECTED NOT DETECTED Final    Comment: Performed at [facility], [place] [address], [place], [place] [address]  Blood Culture (routine x 2)     Status: None (Preliminary result)   Collection Time: 05/19/2020  9:17 AM   Specimen: BLOOD  Result Value Ref Range Status   Specimen Description   Final    BLOOD RIGHT ANTECUBITAL Performed at [facility] [address] [place], [place] [address]    Special Requests   Final    BOTTLES DRAWN AEROBIC AND ANAEROBIC Blood Culture results may not be optimal due to  an excessive volume of blood received in culture bottles Performed at [facility] [address],  [place], [place] [address]    Culture   Final    NO GROWTH < 24 HOURS Performed at [facility] [address], [place], [place] [address]    Report Status PENDING  Incomplete  Urine culture     Status: None   Collection Time: 04/23/2020 12:37 PM   Specimen: In/Out Cath Urine  Result Value Ref Range Status   Specimen Description   Final    IN/OUT CATH URINE Performed at [facility] [address] [place], [place] [address]    Special Requests   Final    NONE Performed at [facility], [place] [address] [place], [place] [address]    Culture   Final    NO GROWTH Performed at [facility], [place] [address] [place], [place] [address]    Report Status 04/21/2020 FINAL  Final  MRSA PCR Screening     Status: None   Collection Time: 05/07/2020  5:45 PM   Specimen: Nasal Mucosa; Nasopharyngeal  Result Value Ref Range Status   MRSA by PCR NEGATIVE NEGATIVE Final    Comment:        The GeneXpert MRSA Assay (FDA approved for NASAL specimens only), is one component of a comprehensive MRSA colonization surveillance program. It is not intended to diagnose MRSA infection nor to guide or monitor treatment for MRSA infections. Performed at [facility], [place] [address], [place], [place] [address]   Respiratory Panel by PCR     Status: None   Collection Time: 05/02/2020  9:20 PM  Result Value Ref Range Status   Adenovirus NOT DETECTED NOT DETECTED Final   Coronavirus 229E NOT DETECTED NOT DETECTED Final    Comment: (NOTE) The Coronavirus on the Respiratory Panel, DOES NOT test for the novel  Coronavirus (2019 nCoV)    Coronavirus HKU1 NOT DETECTED NOT DETECTED Final   Coronavirus NL63 NOT DETECTED NOT DETECTED Final   Coronavirus OC43 NOT DETECTED NOT DETECTED Final   Metapneumovirus NOT DETECTED NOT DETECTED Final   Rhinovirus / Enterovirus NOT DETECTED NOT DETECTED Final   Influenza A NOT DETECTED NOT DETECTED Final    Influenza B NOT DETECTED NOT DETECTED Final   Parainfluenza Virus 1 NOT DETECTED NOT DETECTED Final   Parainfluenza Virus 2 NOT DETECTED NOT DETECTED Final   Parainfluenza Virus 3 NOT DETECTED NOT DETECTED Final   Parainfluenza Virus 4 NOT DETECTED NOT DETECTED Final   Respiratory Syncytial Virus NOT DETECTED NOT DETECTED Final   Bordetella pertussis NOT DETECTED NOT DETECTED Final   Chlamydophila pneumoniae NOT DETECTED NOT DETECTED Final   Mycoplasma pneumoniae NOT DETECTED NOT DETECTED Final    Comment: Performed at [facility], [place] [address] [place],  [address]         Radiology Studies: CT ABDOMEN PELVIS WO CONTRAST  Result Date: 05/11/2020 CLINICAL DATA:  Generalized weakness and sepsis. Suspected diverticulitis. EXAM: CT ABDOMEN AND PELVIS WITHOUT CONTRAST TECHNIQUE: Multidetector CT imaging of the abdomen and pelvis was performed. CONTRAST:  Intravenous contrast not administered. PO contrast administered. COMPARISON:  X-ray abdomen 05/13/2020, CT abdomen pelvis 04/07/2020, CT chest 05/16/2020 FINDINGS: Lower chest: Please see separately dictated CT chest 05/08/2020. Hepatobiliary: No focal liver abnormality is seen. Calcified gallstone within the gallbladder lumen. No gallbladder wall thickening. No biliary ductal dilatation. Pancreas: Unremarkable. No pancreatic ductal dilatation or surrounding  inflammatory changes. Spleen: Normal in size without focal abnormality. Adrenals/Urinary Tract: No adrenal nodule bilaterally. Bilateral renal cortical scarring. No nephrolithiasis, no hydronephrosis, and no contour-deforming renal mass. No ureterolithiasis or hydroureter. The urinary bladder is unremarkable. Stomach/Bowel: Small hiatal hernia. No PO contrast extravasation from the partially opacified lumen of the small bowel and rectum. The small bowel is distended with fluid and gas measuring up to 3.3 cm in caliber on coronal imaging. No associated transition point. No small  bowel wall thickening. No pneumatosis. Diffuse sigmoid diverticulosis with mild bowel wall thickening of the distal descending colon/proximal sigmoid colon (no small large bowel obstruction. The appendix is unremarkable. Vascular/Lymphatic: No abdominal aorta or iliac aneurysm. Severe atherosclerotic plaque of the aorta and its branches. No abdominal, pelvic, or inguinal lymphadenopathy. Reproductive: Status post hysterectomy. No adnexal masses. Other: Moderate volume free gas within the abdomen. Several foci of gas along the proximal sigmoid colon as well as along the left pericolic gutter. Trace simple free fluid within the abdomen and pelvis. No organized fluid collection. No high density simple free fluid within the abdomen or pelvis. Musculoskeletal: Diffuse at least moderate subcutaneus soft tissue edema. Diffuse decreased bone density. No suspicious lytic or blastic osseous lesions. No acute displaced fracture. Multilevel degenerative changes of the spine. Severe right hip degenerative changes with chronic appearing destruction of the right femoral head and associated posterolateral subluxation in relation to the acetabulum. At least mild degenerative changes of the left hip. IMPRESSION: 1. Moderate volume pneumoperitoneum likely arising from a perforated acute sigmoid/distal descending colon diverticulitis. Associated small volume simple free fluid. No associated organized fluid collection. 2. Mild diffuse small bowel distension with no associated transition point suggestive of an ileus. 3. Diffuse at least moderate subcutaneus soft tissue edema. 4.  Aortic Atherosclerosis (ICD10-I70.0) - severe. 5. Please see separately dictated CT chest 05/16/2020. These results were called by telephone at the time of interpretation on 05/03/2020 at 10:21 pm to provider Lang Snow, who verbally acknowledged these results. Electronically Signed   By: Iven Finn M.D.   On: 05/09/2020 22:24   CT CHEST WO  CONTRAST  Result Date: 05/07/2020 CLINICAL DATA:  COPD exacerbation, generalized weakness, sepsis EXAM: CT CHEST WITHOUT CONTRAST TECHNIQUE: Multidetector CT imaging of the chest was performed following the standard protocol without IV contrast. COMPARISON:  05/16/2020 FINDINGS: Cardiovascular: There is extensive atherosclerosis of the aorta and coronary vasculature. No pericardial effusion. The heart is not enlarged. Mediastinum/Nodes: No enlarged mediastinal or axillary lymph nodes. Thyroid gland, trachea, and esophagus demonstrate no significant findings. Lungs/Pleura: There is diffuse emphysema. Small bilateral pleural effusions are identified, left greater than right. Dense left lower lobe consolidation with opacification of the left lower lobe segmental bronchi, favor atelectasis. No pneumothorax. Upper Abdomen: Please refer to CT abdomen report for important findings in that region related to pneumoperitoneum. Musculoskeletal: No acute or destructive bony lesions. Reconstructed images demonstrate no additional findings. IMPRESSION: 1. Left lower lobe consolidation and opacification of left lower lobe bronchi, favor atelectasis. 2. Small bilateral pleural effusions, left greater than right. 3. Aortic Atherosclerosis (ICD10-I70.0) and Emphysema (ICD10-J43.9). 4. Please refer to separately reported CT abdomen and pelvis exam describing important findings in that region. Electronically Signed   By: Randa Ngo M.D.   On: 05/05/2020 22:08   DG Chest Port 1 View  Result Date: 05/01/2020 CLINICAL DATA:  Shortness of breath. EXAM: PORTABLE CHEST 1 VIEW COMPARISON:  April 07, 2020. FINDINGS: Stable cardiomediastinal silhouette. No pneumothorax is noted. Mild bibasilar subsegmental atelectasis is  noted with probable small pleural effusions. Bony thorax is unremarkable. IMPRESSION: Mild bibasilar subsegmental atelectasis with probable small pleural effusions. Aortic Atherosclerosis (ICD10-I70.0).  Electronically Signed   By: Marijo Conception M.D.   On: 05/14/2020 08:53   DG Abd Portable 1V  Result Date: 04/30/2020 CLINICAL DATA:  Generalized abdominal pain. EXAM: PORTABLE ABDOMEN - 1 VIEW COMPARISON:  None. FINDINGS: Mildly dilated small bowel loops are noted concerning for distal small bowel obstruction or ileus. No colonic dilatation is noted. No radio-opaque calculi or other significant radiographic abnormality are seen. IMPRESSION: Mildly dilated small bowel loops are noted concerning for distal small bowel obstruction or ileus. Electronically Signed   By: Marijo Conception M.D.   On: 04/21/2020 14:34   [place] EKG SITE RITE  Result Date: 04/21/2020 If Site Rite image not attached, placement could not be confirmed due to current cardiac rhythm.       Scheduled Meds: . aspirin  81 mg Oral Daily  . Chlorhexidine Gluconate Cloth  6 each Topical Daily  . heparin  5,000 Units Subcutaneous Q8H  . [START ON 04/22/2020] ipratropium-albuterol  3 mL Nebulization QID  . levothyroxine  100 mcg Oral Q0600  . [START ON 04/22/2020] methylPREDNISolone (SOLU-MEDROL) injection  40 mg Intravenous Q12H  . mometasone-formoterol  1 puff Inhalation BID  . sodium chloride flush  3 mL Intravenous Q12H   Continuous Infusions: . albumin human    . ceFEPime (MAXIPIME) IV    . dextrose 5 % and 0.9% NaCl    . metronidazole Stopped (04/21/20 2055)  . potassium chloride 10 mEq (04/21/20 2253)     LOS: 1 day    Time spent:40 min    Bessye Stith, Geraldo Docker, MD [facility] Pager [phone]  If 7PM-7AM, please contact night-coverage [email or web address] Password [facility] 04/21/2020, 11:03 PM

## 2020-04-21 NOTE — Progress Notes (Signed)
[facility] P[facility]M  Chart reviewed CT ab/pelvis > moderate volume pneumoperitoneum with perforated acute sigmoid colon/diverticulitis; ileus CT chest > LLL atelectasis  General surgery consulted, conservative management at this point is their plan  BP improved  PCCM available PRN  Roselie Awkward, MD [facility] Pager: [phone] Cell: [phone] If no response, call [phone]

## 2020-04-21 NOTE — Progress Notes (Signed)
Initial Nutrition Assessment  DOCUMENTATION CODES:   Not applicable  INTERVENTION:  - diet advancement as medically feasible. - will complete NFPE at follow-up.   NUTRITION DIAGNOSIS:   Inadequate oral intake related to inability to eat as evidenced by NPO status.  GOAL:   Patient will meet greater than or equal to 90% of their needs  MONITOR:   Diet advancement, Labs, Weight trends  REASON FOR ASSESSMENT:   Consult Assessment of nutrition requirement/status  ASSESSMENT:   86 year old female with a medical history of chronic hypoxic respiratory failure on 3 L O2, chronic hyponatremia, COPD, HLD, CHF, and hypothyroidism. She was admitted 10/19-10/29 for multiple medical issues including alcohol withdrawal seizure, NSTEMI, and COPD exacerbation 2/2 CAP. She was discharged to SNF. She presented to the ED due to sore throat, malaise, abdominal pain and distention, and chest tightness. In the ED, her daughter reported that she had been more lethargic and not eating or drinking much during the few days she was at [facility]  Unable to see patient x2 attempts earlier today. Patient is now in [place]. She has been NPO since admission except for being on CLD x1 hour yesterday afternoon; no intakes documented from that time.   Weight today is 156 lb and weight yesterday was 146 lb. Weight yesterday is more consistent with previous weights although still up from weights earlier in the year. Mild pitting edema to BLE documented in the flow sheet.  Per notes: - poor surgical candidate - COPD exacerbation - concern for early sepsis stages - abdominal pain and distention with concern for SBO - AKI - recent NSTEMI   Labs reviewed; CBGs: 171 and 170 mg/dl, Na: 129 mmol/l, K: 3.3 mmol/l, Cl: 89 mmol/l, creatinine: 1.02 mg/dl, Ca: 7.4 mg/dl, GFR: 54 ml/min.  Medications reviewed; 100 mcg oral synthroid/day, 125 mg solu-medrol every 6 hours on 11/1, 40 mg deltasone IVF; D5-1/2 NS @ 100 ml/hr (408  kcal).     NUTRITION - FOCUSED PHYSICAL EXAM:  unable to complete at this time.   Diet Order:   Diet Order            Diet NPO time specified  Diet effective now                 EDUCATION NEEDS:   No education needs have been identified at this time  Skin:  Skin Assessment: Reviewed RN Assessment  Last BM:  unknown  Height:   Ht Readings from Last 1 Encounters:  05/03/2020 5\' 6"  (1.676 m)    Weight:   Wt Readings from Last 1 Encounters:  04/21/20 71 kg    Estimated Nutritional Needs:  Kcal:  1500-1700 kcal Protein:  70-80 grams Fluid:  >/= 1.7 L/day      Jarome Matin, MS, RD, LDN, CNSC Inpatient Clinical Dietitian RD pager # available in AMION  After hours/weekend pager # available in [facility]

## 2020-04-21 NOTE — Progress Notes (Signed)
Paged on call Gen Surgery x3 since 11pm Monday, awaiting response.

## 2020-04-21 NOTE — Progress Notes (Signed)
Peripherally Inserted Central Catheter Placement  The IV Nurse has discussed with the patient and/or persons authorized to consent for the patient, the purpose of this procedure and the potential benefits and risks involved with this procedure.  The benefits include less needle sticks, lab draws from the catheter, and the patient may be discharged home with the catheter. Risks include, but not limited to, infection, bleeding, blood clot (thrombus formation), and puncture of an artery; nerve damage and irregular heartbeat and possibility to perform a PICC exchange if needed/ordered by physician.  Alternatives to this procedure were also discussed.  Bard Power PICC patient education guide, fact sheet on infection prevention and patient information card has been provided to patient /or left at bedside.    PICC Placement Documentation  PICC Double Lumen 04/21/20 PICC Right Brachial 39 cm 0 cm (Active)  Indication for Insertion or Continuance of Line Limited venous access - need for IV therapy >5 days (PICC only) 04/21/20 2005  Exposed Catheter (cm) 0 cm 04/21/20 2005  Site Assessment Clean;Dry;Intact 04/21/20 2005  Lumen #1 Status Blood return noted;Flushed;Saline locked 04/21/20 2005  Lumen #2 Status Blood return noted;Flushed;Saline locked 04/21/20 2005  Dressing Type Transparent 04/21/20 2005  Dressing Status Clean;Dry;Intact 04/21/20 2005  Antimicrobial disc in place? Yes 04/21/20 2005  Safety Lock Not Applicable 10/62/69 4854  Line Adjustment (NICU/IV Team Only) No 04/21/20 2005  Dressing Intervention New dressing 04/21/20 2005  Dressing Change Due 04/28/20 04/21/20 2005       Edson Snowball 04/21/2020, 8:26 PM

## 2020-04-21 NOTE — Progress Notes (Signed)
Pharmacy Antibiotic Note  [name] is a 86 y.o. female admitted on 05/11/2020 with sepsis.  Pharmacy has been consulted for Cefepime and Vancomycin dosing. Noted listed PCN, Ampicillin, Cephalosporin allergies - has tolerated IV cephs in the recent past.  SCr improved to 1.02, WBC now wnl, afebrile  Plan: Increase cefepime to 2 g iv q 12 hours  Increase vancomycin dose to 1 g iv q 24 hours (predicted AUC 547) Measure Vanc levels as needed.  Follow up renal function, culture results, and clinical course.   Height: 5\' 6"  (167.6 cm) Weight: 71 kg (156 lb 8.4 oz) IBW/kg (Calculated) : 59.3  Temp (24hrs), Avg:97.5 F (36.4 C), Min:97.5 F (36.4 C), Max:97.5 F (36.4 C)  Recent Labs  Lab 04/15/20 0439 04/15/20 0439 04/16/20 0403 04/17/20 0419 05/10/2020 0748 04/27/2020 0948 05/06/2020 1332 05/17/2020 1921 04/21/20 0219  WBC 10.1  --  9.3 7.5 15.5*  --   --   --  10.5  CREATININE 0.78   < > 0.79 0.79 1.38*  --   --  1.14* 1.02*  LATICACIDVEN  --   --   --   --  1.6 2.3* 2.0* 1.2  --    < > = values in this interval not displayed.    Estimated Creatinine Clearance: 37.7 mL/min (A) (by C-G formula based on SCr of 1.02 mg/dL (H)).    Allergies  Allergen Reactions  . Ampicillin Diarrhea and Nausea Only    GI upset  . Azithromycin Diarrhea and Nausea And Vomiting  . Codeine Nausea And Vomiting and Other (See Comments)    Makes her "crazy"  . Daliresp [Roflumilast] Nausea Only  . Nitrofurantoin Diarrhea and Nausea Only  . Other Other (See Comments)    No nuts and seeds because of diverticulitis  . Penicillins Nausea And Vomiting, Swelling and Rash    Has patient had a PCN reaction causing immediate rash, facial/tongue/throat swelling, SOB or lightheadedness with hypotension: Yes Has patient had a PCN reaction causing severe rash involving mucus membranes or skin necrosis: No Has patient had a PCN reaction that required hospitalization No Has patient had a PCN reaction  occurring within the last 10 years: No No "cillins" If all of the above answers are "NO", then may proceed with Cephalosporin use.   . Sulfonamide Derivatives Diarrhea and Nausea And Vomiting  . Tiotropium Bromide Monohydrate     Urinary retention  . Cephalosporins Nausea And Vomiting    IV seems to be ok  . Doxycycline Nausea Only  . Protonix [Pantoprazole] Nausea Only  . Fludrocortisone Acetate Itching    Tolerates prednisone  . Latex Itching and Rash    Antimicrobials this admission:  11/1 Cefepime 11/1 Vanc 11/1 Metronidazole  Dose adjustments this admission:    Microbiology results:  11/1 Group A Strep PCR: none detected 11/1 UCx: sent 11/1 BCx: ngtd 11/1 Resp panel: neg 11/1 MRSA PCR: neg  Thank you for allowing pharmacy to be a part of this patient's care.  Ulice Dash, PharmD, [place] [phone] 04/21/2020 12:47 PM

## 2020-04-22 DIAGNOSIS — I5042 Chronic combined systolic (congestive) and diastolic (congestive) heart failure: Secondary | ICD-10-CM | POA: Diagnosis not present

## 2020-04-22 DIAGNOSIS — Z7189 Other specified counseling: Secondary | ICD-10-CM | POA: Diagnosis not present

## 2020-04-22 DIAGNOSIS — Z515 Encounter for palliative care: Secondary | ICD-10-CM

## 2020-04-22 DIAGNOSIS — A419 Sepsis, unspecified organism: Secondary | ICD-10-CM | POA: Diagnosis not present

## 2020-04-22 DIAGNOSIS — R109 Unspecified abdominal pain: Secondary | ICD-10-CM

## 2020-04-22 DIAGNOSIS — J439 Emphysema, unspecified: Secondary | ICD-10-CM

## 2020-04-22 DIAGNOSIS — N179 Acute kidney failure, unspecified: Secondary | ICD-10-CM | POA: Diagnosis not present

## 2020-04-22 DIAGNOSIS — K5720 Diverticulitis of large intestine with perforation and abscess without bleeding: Secondary | ICD-10-CM | POA: Diagnosis not present

## 2020-04-22 DIAGNOSIS — J9611 Chronic respiratory failure with hypoxia: Secondary | ICD-10-CM | POA: Diagnosis not present

## 2020-04-22 LAB — COMPREHENSIVE METABOLIC PANEL
ALT: 23 U/L (ref 0–44)
AST: 23 U/L (ref 15–41)
Albumin: 2.6 g/dL — ABNORMAL LOW (ref 3.5–5.0)
Alkaline Phosphatase: 91 U/L (ref 38–126)
Anion gap: 7 (ref 5–15)
BUN: 18 mg/dL (ref 8–23)
CO2: 32 mmol/L (ref 22–32)
Calcium: 8.4 mg/dL — ABNORMAL LOW (ref 8.9–10.3)
Chloride: 92 mmol/L — ABNORMAL LOW (ref 98–111)
Creatinine, Ser: 0.92 mg/dL (ref 0.44–1.00)
GFR, Estimated: >60 mL/min (ref >60)
Glucose, Bld: 159 mg/dL — ABNORMAL HIGH (ref 70–99)
Potassium: 4 mmol/L (ref 3.5–5.1)
Sodium: 131 mmol/L — ABNORMAL LOW (ref 135–145)
Total Bilirubin: 0.8 mg/dL (ref 0.3–1.2)
Total Protein: 5.3 g/dL — ABNORMAL LOW (ref 6.5–8.1)

## 2020-04-22 LAB — CBC WITH DIFFERENTIAL/PLATELET
Abs Immature Granulocytes: 0.05 10*3/uL (ref 0.00–0.07)
Basophils Absolute: 0 10*3/uL (ref 0.0–0.1)
Basophils Relative: 0 %
Eosinophils Absolute: 0 10*3/uL (ref 0.0–0.5)
Eosinophils Relative: 0 %
HCT: 24.9 % — ABNORMAL LOW (ref 36.0–46.0)
Hemoglobin: 8.1 g/dL — ABNORMAL LOW (ref 12.0–15.0)
Immature Granulocytes: 1 %
Lymphocytes Relative: 5 %
Lymphs Abs: 0.4 10*3/uL — ABNORMAL LOW (ref 0.7–4.0)
MCH: 31.5 pg (ref 26.0–34.0)
MCHC: 32.5 g/dL (ref 30.0–36.0)
MCV: 96.9 fL (ref 80.0–100.0)
Monocytes Absolute: 0.3 10*3/uL (ref 0.1–1.0)
Monocytes Relative: 4 %
Neutro Abs: 8.3 10*3/uL — ABNORMAL HIGH (ref 1.7–7.7)
Neutrophils Relative %: 90 %
Platelets: 247 10*3/uL (ref 150–400)
RBC: 2.57 MIL/uL — ABNORMAL LOW (ref 3.87–5.11)
RDW: 14.1 % (ref 11.5–15.5)
WBC: 9.2 10*3/uL (ref 4.0–10.5)
nRBC: 0 % (ref 0.0–0.2)

## 2020-04-22 LAB — PROCALCITONIN
Procalcitonin: 0.23 ng/mL
Procalcitonin: 0.27 ng/mL

## 2020-04-22 LAB — MAGNESIUM
Magnesium: 2 mg/dL (ref 1.7–2.4)
Magnesium: 2 mg/dL (ref 1.7–2.4)

## 2020-04-22 LAB — PHOSPHORUS
Phosphorus: 2.3 mg/dL — ABNORMAL LOW (ref 2.5–4.6)
Phosphorus: 2.2 mg/dL — ABNORMAL LOW (ref 2.5–4.6)

## 2020-04-22 LAB — GLUCOSE, CAPILLARY
Glucose-Capillary: 119 mg/dL — ABNORMAL HIGH (ref 70–99)
Glucose-Capillary: 131 mg/dL — ABNORMAL HIGH (ref 70–99)
Glucose-Capillary: 122 mg/dL — ABNORMAL HIGH (ref 70–99)
Glucose-Capillary: 150 mg/dL — ABNORMAL HIGH (ref 70–99)

## 2020-04-22 LAB — LACTIC ACID, PLASMA
Lactic Acid, Venous: 1.3 mmol/L (ref 0.5–1.9)
Lactic Acid, Venous: 1.2 mmol/L (ref 0.5–1.9)

## 2020-04-22 LAB — POTASSIUM: Potassium: 4 mmol/L (ref 3.5–5.1)

## 2020-04-22 MED ORDER — IPRATROPIUM-ALBUTEROL 0.5-2.5 (3) MG/3ML IN SOLN
3.0000 mL | Freq: Three times a day (TID) | RESPIRATORY_TRACT | Status: DC
  Administered 2020-04-22 – 2020-04-25 (×9): 3 mL via RESPIRATORY_TRACT
  Filled 2020-04-22 (×9): qty 3

## 2020-04-22 MED ORDER — LIP MEDEX EX OINT
TOPICAL_OINTMENT | CUTANEOUS | Status: DC | PRN
  Administered 2020-04-22: 1 via TOPICAL
  Filled 2020-04-22: qty 7

## 2020-04-22 NOTE — Progress Notes (Signed)
Chaplain came by to meet patient. She was receptive to visit.  She showed me her arms and talked about how the bone pillow was helpful in keeping it elevated.  She said "I don't know what's going on.  I want to go home but you don't have anything to do with that..I don't know if I will get to go home or be some other place."  Patient says she would like to be home because she misses her husband [name].  She just had a birthday and they marked their 29 year wedding anniversary by his visit to see her here.  "It's hard." she said.  Her daughter, who is a Licensed conveyancer at the [employer] is very active in her care-giving.  "I know what to eat. I have a wheelchair and a ramp."  Chaplain said, "You sound very independent."  "I used to be" she replied.  Her son also is engaged with looking after his parents.  Chaplain offered prayer for healing and thanksgiving for her happy marriage to [place]. Rev. Tamsen Snider Pager [phone]

## 2020-04-22 NOTE — Progress Notes (Signed)
Patient ID: [name], female   DOB: [birth year], 86 y.o.   MRN: [id number]  PROGRESS NOTE    [name]  DTO:[id number] DOB: [birth year] DOA: 05/11/2020 PCP: Binnie Rail, MD   Brief Narrative:  86 year old female with history of COPD, chronic hypoxic respiratory failure on 3 L/min oxygen via nasal cannula, chronic hyponatremia, hyperlipidemia, chronic systolic and diastolic heart failure, hypothyroidism, recent admission from 04/07/2020-04/17/2020 with multiple medical issues including alcohol withdrawal seizure, non-STEMI, COPD exacerbation, CAP and hyponatremia with subsequent discharge to SNF presented on 05/17/2020 with sore throat, lethargy, poor oral intake and chest tightness.  On presentation, she was hypotensive, requiring 5 L oxygen via nasal cannula with sodium of 124, creatinine 1.38, troponin flat at 20x2, WBC of 15.5.  COVID-19 and influenza test were negative and throat swab was negative for strep.  CT of the abdomen and pelvis showed moderate volume pneumoperitoneum possibly from perforated acute sigmoid/distal descending colon diverticulitis with possible ileus.  CT of the chest showed left lower lobe consolidation and opacification of left lower lobe bronchi, favor atelectasis along with bilateral small pleural effusions.  She was started on broad-spectrum antibiotics.  General surgery was consulted; recommended conservative management.  PCCM was also consulted.  Assessment & Plan:   Severe sepsis: Present on admission Acute diverticulitis of sigmoid/descending colon with perforation and pneumoperitoneum -General surgery following and recommending conservative management.  Diet advancement as per general surgery. -Continue cefepime and Flagyl.  Of vancomycin.  Continue IV fluids. -Palliative care consultation for goals of care discussion and possible hospice/comfort measures if condition does not improve  COPD/emphysema Chronic hypoxic respiratory failure on 3 L  oxygen via nasal cannula at home Bilateral pleural effusion -Continue Dulera and nebs.  DC Solu-Medrol -Continue oxygen supplementation.  Currently on 4 L oxygen -PCCM has signed off.  Chronic systolic and diastolic heart failure Recent non-STEMI -EF of 45 to 45% in the last echo on 04/07/2020 -Strict input output.  Daily weights.  Currently compensated.  Hypertension -Monitor blood pressure.  Blood pressure medications on hold  Leukocytosis -Resolved  Hypokalemia -Resolved  Acute on chronic hyponatremia -Baseline sodium of 132.  Sodium 131 today.  Monitor.  AKI -Improving.  Creatinine 0.9 today.  Generalized deconditioning -Palliative care consultation is pending.  PT recommends SNF placement.  Overall course is very poor.    DVT prophylaxis: Subcutaneous heparin Code Status: DNR Family Communication:  Disposition Plan: Status is: Inpatient  Remains inpatient appropriate because:Inpatient level of care appropriate due to severity of illness   Dispo: The patient is from: SNF              Anticipated d/c is to: SNF              Anticipated d/c date is: > 3 days              Patient currently is not medically stable to d/c.  Consultants: General surgery/PCCM/palliative care  Procedures: None  Antimicrobials:  Anti-infectives (From admission, onward)   Start     Dose/Rate Route Frequency Ordered Stop   04/22/20 1000  vancomycin (VANCOCIN) IVPB 1000 mg/200 mL premix  Status:  Discontinued        1,000 mg 200 mL/hr over 60 Minutes Intravenous Every 36 hours 04/23/2020 1453 04/21/20 1246   04/21/20 2200  ceFEPIme (MAXIPIME) 2 g in sodium chloride 0.9 % 100 mL IVPB        2 g 200 mL/hr over 30 Minutes Intravenous Every 12 hours 04/21/20 1246  04/21/20 1400  vancomycin (VANCOCIN) IVPB 1000 mg/200 mL premix  Status:  Discontinued        1,000 mg 200 mL/hr over 60 Minutes Intravenous Every 24 hours 04/21/20 1246 04/21/20 2230   04/21/20 1000  ceFEPIme (MAXIPIME) 2  g in sodium chloride 0.9 % 100 mL IVPB  Status:  Discontinued        2 g 200 mL/hr over 30 Minutes Intravenous Every 24 hours 05/17/2020 1453 04/21/20 1246   05/02/2020 2200  metroNIDAZOLE (FLAGYL) IVPB 500 mg        500 mg 100 mL/hr over 60 Minutes Intravenous Every 8 hours 05/08/2020 1340     04/23/2020 1345  ceFEPIme (MAXIPIME) 2 g in sodium chloride 0.9 % 100 mL IVPB  Status:  Discontinued        2 g 200 mL/hr over 30 Minutes Intravenous  Once 05/12/2020 1340 05/14/2020 1454   04/29/2020 1345  vancomycin (VANCOCIN) IVPB 1000 mg/200 mL premix  Status:  Discontinued        1,000 mg 200 mL/hr over 60 Minutes Intravenous  Once 05/16/2020 1340 05/08/2020 1454   04/30/2020 1115  ceFEPIme (MAXIPIME) 2 g in sodium chloride 0.9 % 100 mL IVPB        2 g 200 mL/hr over 30 Minutes Intravenous  Once 04/30/2020 1105 05/08/2020 1156   04/22/2020 1115  metroNIDAZOLE (FLAGYL) IVPB 500 mg        500 mg 100 mL/hr over 60 Minutes Intravenous  Once 04/24/2020 1105 04/29/2020 1442   05/17/2020 1115  vancomycin (VANCOCIN) IVPB 1000 mg/200 mL premix        1,000 mg 200 mL/hr over 60 Minutes Intravenous  Once 04/23/2020 1105 04/22/2020 1253      Subjective: Patient seen and examined at bedside.  Denies worsening abdominal pain.  Poor historian.  No overnight fever or vomiting reported.  Objective: Vitals:   04/21/20 2024 04/22/20 0500 04/22/20 0628 04/22/20 0728  BP: (!) 119/52  134/65   Pulse: 82  96 91  Resp: 18   17  Temp: 97.7 F (36.5 C)  98 F (36.7 C)   TempSrc: Oral  Oral   SpO2: 90%  92% 95%  Weight:  72.7 kg    Height:        Intake/Output Summary (Last 24 hours) at 04/22/2020 1216 Last data filed at 04/22/2020 6144 Gross per 24 hour  Intake 1305.89 ml  Output 1100 ml  Net 205.89 ml   Filed Weights   05/09/2020 0743 04/21/20 0426 04/22/20 0500  Weight: 66.2 kg 71 kg 72.7 kg    Examination:  General exam: Elderly female lying in bed.  Looks chronically ill. Respiratory system: Bilateral decreased breath sounds  at bases with some scattered crackles Cardiovascular system: S1 & S2 heard, Rate controlled Gastrointestinal system: Abdomen is nondistended, soft and mildly tender diffusely.  Bowel sounds sluggish. Extremities: No cyanosis, clubbing; trace lower extremity edema Central nervous system: Alert and oriented.  Very poor historian.  Slow to respond.  No focal neurological deficits. Moving extremities Skin: No rashes, lesions or ulcers Psychiatry: Flat affect.   Data Reviewed: I have personally reviewed following labs and imaging studies  CBC: Recent Labs  Lab 04/16/20 0403 04/17/20 0419 05/09/2020 0748 04/21/20 0219 04/22/20 0405  WBC 9.3 7.5 15.5* 10.5 9.2  NEUTROABS 7.5 5.8 12.6*  --  8.3*  HGB 9.9* 9.6* 10.1* 8.8* 8.1*  HCT 31.0* 29.4* 31.2* 27.3* 24.9*  MCV 99.4 97.7 97.5 97.2 96.9  PLT 181  161 212 195 616   Basic Metabolic Panel: Recent Labs  Lab 04/16/20 0403 04/16/20 0403 04/17/20 0419 04/17/20 0419 04/26/2020 0748 05/14/2020 1921 04/21/20 0219 04/21/20 2253 04/22/20 0405  NA 133*   < > 132*  --  124* 128* 129*  --  131*  K 3.3*   < > 3.5   < > 3.6 3.4* 3.3* 4.0 4.0  CL 82*   < > 81*  --  79* 84* 89*  --  92*  CO2 43*   < > 42*  --  34* 32 30  --  32  GLUCOSE 92   < > 93  --  102* 101* 142*  --  159*  BUN 14   < > 14  --  29* 24* 23  --  18  CREATININE 0.79   < > 0.79  --  1.38* 1.14* 1.02*  --  0.92  CALCIUM 8.7*   < > 8.5*  --  8.3* 8.1* 7.4*  --  8.4*  MG 2.0  --  2.1  --   --   --  1.9 2.0 2.0  PHOS  --   --   --   --   --   --  3.7 2.3* 2.2*   < > = values in this interval not displayed.   GFR: Estimated Creatinine Clearance: 45.7 mL/min (by C-G formula based on SCr of 0.92 mg/dL). Liver Function Tests: Recent Labs  Lab 04/16/20 0403 04/17/20 0419 05/10/2020 0748 04/22/20 0405  AST 42* 43* 40 23  ALT 38 39 30 23  ALKPHOS 99 106 126 91  BILITOT 0.8 0.7 0.7 0.8  PROT 5.1* 5.1* 5.3* 5.3*  ALBUMIN 2.2* 2.1* 2.2* 2.6*   No results for input(s): LIPASE,  AMYLASE in the last 168 hours. No results for input(s): AMMONIA in the last 168 hours. Coagulation Profile: Recent Labs  Lab 05/04/2020 0748  INR 1.0   Cardiac Enzymes: No results for input(s): CKTOTAL, CKMB, CKMBINDEX, TROPONINI in the last 168 hours. BNP (last 3 results) No results for input(s): PROBNP in the last 8760 hours. HbA1C: No results for input(s): HGBA1C in the last 72 hours. CBG: Recent Labs  Lab 04/21/20 1133 04/21/20 1631 04/21/20 2021 04/22/20 0744 04/22/20 1125  GLUCAP 170* 164* 155* 119* 131*   Lipid Profile: No results for input(s): CHOL, HDL, LDLCALC, TRIG, CHOLHDL, LDLDIRECT in the last 72 hours. Thyroid Function Tests: No results for input(s): TSH, T4TOTAL, FREET4, T3FREE, THYROIDAB in the last 72 hours. Anemia Panel: No results for input(s): VITAMINB12, FOLATE, FERRITIN, TIBC, IRON, RETICCTPCT in the last 72 hours. Sepsis Labs: Recent Labs  Lab 05/16/2020 1332 04/30/2020 1921 04/21/20 2238 04/22/20 0405  PROCALCITON  --   --  0.23 0.27  LATICACIDVEN 2.0* 1.2 1.3 1.2    Recent Results (from the past 240 hour(s))  SARS Coronavirus 2 by RT PCR (hospital order, performed in [facility] hospital lab) Nasopharyngeal Nasopharyngeal Swab     Status: None   Collection Time: 04/16/20 11:00 PM   Specimen: Nasopharyngeal Swab  Result Value Ref Range Status   SARS Coronavirus 2 NEGATIVE NEGATIVE Final    Comment: (NOTE) SARS-CoV-2 target nucleic acids are NOT DETECTED.  The SARS-CoV-2 RNA is generally detectable in upper and lower respiratory specimens during the acute phase of infection. The lowest concentration of SARS-CoV-2 viral copies this assay can detect is 250 copies / mL. A negative result does not preclude SARS-CoV-2 infection and should not be used as the sole  basis for treatment or other patient management decisions.  A negative result may occur with improper specimen collection / handling, submission of specimen other than nasopharyngeal swab,  presence of viral mutation(s) within the areas targeted by this assay, and inadequate number of viral copies (<250 copies / mL). A negative result must be combined with clinical observations, patient history, and epidemiological information.  Fact Sheet for Patients:   StrictlyIdeas.no  Fact Sheet for Healthcare Providers: BankingDealers.co.za  This test is not yet approved or  cleared by the [place] FDA and has been authorized for detection and/or diagnosis of SARS-CoV-2 by FDA under an Emergency Use Authorization (EUA).  This EUA will remain in effect (meaning this test can be used) for the duration of the COVID-19 declaration under Section 564(b)(1) of the Act, 21 U.S.C. section 360bbb-3(b)(1), unless the authorization is terminated or revoked sooner.  Performed at [facility], [place] [address], [place], [place] [address]   Blood Culture (routine x 2)     Status: None (Preliminary result)   Collection Time: 04/21/2020  7:48 AM   Specimen: BLOOD  Result Value Ref Range Status   Specimen Description   Final    BLOOD RIGHT ANTECUBITAL Performed at [facility] [address] [place], [place] [address]    Special Requests   Final    BOTTLES DRAWN AEROBIC AND ANAEROBIC Blood Culture results may not be optimal due to an excessive volume of blood received in culture bottles Performed at [facility] [address] [place], [place] [address]    Culture   Final    NO GROWTH 2 DAYS Performed at [facility] [address] [place], [place] [address]    Report Status PENDING  Incomplete  Respiratory Panel by RT PCR (Flu A&B, Covid) - Nasopharyngeal Swab     Status: None   Collection Time: 04/21/2020  8:50 AM   Specimen: Nasopharyngeal Swab  Result Value Ref Range Status   SARS Coronavirus 2 by RT PCR NEGATIVE NEGATIVE Final    Comment: (NOTE) SARS-CoV-2 target  nucleic acids are NOT DETECTED.  The SARS-CoV-2 RNA is generally detectable in upper respiratoy specimens during the acute phase of infection. The lowest concentration of SARS-CoV-2 viral copies this assay can detect is 131 copies/mL. A negative result does not preclude SARS-Cov-2 infection and should not be used as the sole basis for treatment or other patient management decisions. A negative result may occur with  improper specimen collection/handling, submission of specimen other than nasopharyngeal swab, presence of viral mutation(s) within the areas targeted by this assay, and inadequate number of viral copies (<131 copies/mL). A negative result must be combined with clinical observations, patient history, and epidemiological information. The expected result is Negative.  Fact Sheet for Patients:  PinkCheek.be  Fact Sheet for Healthcare Providers:  GravelBags.it  This test is no t yet approved or cleared by the [place] FDA and  has been authorized for detection and/or diagnosis of SARS-CoV-2 by FDA under an Emergency Use Authorization (EUA). This EUA will remain  in effect (meaning this test can be used) for the duration of the COVID-19 declaration under Section 564(b)(1) of the Act, 21 U.S.C. section 360bbb-3(b)(1), unless the authorization is terminated or revoked sooner.     Influenza A by PCR NEGATIVE NEGATIVE Final   Influenza B by PCR NEGATIVE NEGATIVE Final    Comment: (NOTE) The Xpert Xpress SARS-CoV-2/FLU/RSV assay is intended as an aid in  the  diagnosis of influenza from Nasopharyngeal swab specimens and  should not be used as a sole basis for treatment. Nasal washings and  aspirates are unacceptable for Xpert Xpress SARS-CoV-2/FLU/RSV  testing.  Fact Sheet for Patients: PinkCheek.be  Fact Sheet for Healthcare  Providers: GravelBags.it  This test is not yet approved or cleared by the [place] FDA and  has been authorized for detection and/or diagnosis of SARS-CoV-2 by  FDA under an Emergency Use Authorization (EUA). This EUA will remain  in effect (meaning this test can be used) for the duration of the  Covid-19 declaration under Section 564(b)(1) of the Act, 21  U.S.C. section 360bbb-3(b)(1), unless the authorization is  terminated or revoked. Performed at [facility], [place] [address], [place], [place] [address]   Group A Strep by PCR     Status: None   Collection Time: 05/17/2020  8:58 AM  Result Value Ref Range Status   Group A Strep by PCR NOT DETECTED NOT DETECTED Final    Comment: Performed at [facility] [place] [address], [place], [place] [address]  Blood Culture (routine x 2)     Status: None (Preliminary result)   Collection Time: 05/06/2020  9:17 AM   Specimen: BLOOD  Result Value Ref Range Status   Specimen Description   Final    BLOOD RIGHT ANTECUBITAL Performed at [facility] [address], [place], [place] [address]    Special Requests   Final    BOTTLES DRAWN AEROBIC AND ANAEROBIC Blood Culture results may not be optimal due to an excessive volume of blood received in culture bottles Performed at [facility] [address] [place], [place] [address]    Culture   Final    NO GROWTH 2 DAYS Performed at [facility] [address], [place], [place] [address]    Report Status PENDING  Incomplete  Urine culture     Status: None   Collection Time: 04/29/2020 12:37 PM   Specimen: In/Out Cath Urine  Result Value Ref Range Status   Specimen Description   Final    IN/OUT CATH URINE Performed at [facility] [address], [place], [place] [address]    Special Requests   Final    NONE Performed at [facility], [place]  [address], [place], [place] [address]    Culture   Final    NO GROWTH Performed at [facility], [place] [address], [place], [place] [address]    Report Status 04/21/2020 FINAL  Final  MRSA PCR Screening     Status: None   Collection Time: 04/22/2020  5:45 PM   Specimen: Nasal Mucosa; Nasopharyngeal  Result Value Ref Range Status   MRSA by PCR NEGATIVE NEGATIVE Final    Comment:        The GeneXpert MRSA Assay (FDA approved for NASAL specimens only), is one component of a comprehensive MRSA colonization surveillance program. It is not intended to diagnose MRSA infection nor to guide or monitor treatment for MRSA infections. Performed at [facility], [place] [address] [place], [place] [address]   Respiratory Panel by PCR     Status: None   Collection Time: 05/07/2020  9:20 PM  Result Value Ref Range Status   Adenovirus NOT DETECTED NOT DETECTED Final   Coronavirus 229E NOT DETECTED NOT DETECTED Final    Comment: (NOTE) The Coronavirus on the Respiratory Panel, DOES NOT test for  the novel  Coronavirus (2019 nCoV)    Coronavirus HKU1 NOT DETECTED NOT DETECTED Final   Coronavirus NL63 NOT DETECTED NOT DETECTED Final   Coronavirus OC43 NOT DETECTED NOT DETECTED Final   Metapneumovirus NOT DETECTED NOT DETECTED Final   Rhinovirus / Enterovirus NOT DETECTED NOT DETECTED Final   Influenza A NOT DETECTED NOT DETECTED Final   Influenza B NOT DETECTED NOT DETECTED Final   Parainfluenza Virus 1 NOT DETECTED NOT DETECTED Final   Parainfluenza Virus 2 NOT DETECTED NOT DETECTED Final   Parainfluenza Virus 3 NOT DETECTED NOT DETECTED Final   Parainfluenza Virus 4 NOT DETECTED NOT DETECTED Final   Respiratory Syncytial Virus NOT DETECTED NOT DETECTED Final   Bordetella pertussis NOT DETECTED NOT DETECTED Final   Chlamydophila pneumoniae NOT DETECTED NOT DETECTED Final   Mycoplasma pneumoniae NOT DETECTED NOT DETECTED Final    Comment: Performed at [place], [place] [address] [place], [place] [address]         Radiology Studies: CT ABDOMEN PELVIS WO CONTRAST  Result Date: 05/17/2020 CLINICAL DATA:  Generalized weakness and sepsis. Suspected diverticulitis. EXAM: CT ABDOMEN AND PELVIS WITHOUT CONTRAST TECHNIQUE: Multidetector CT imaging of the abdomen and pelvis was performed. CONTRAST:  Intravenous contrast not administered. PO contrast administered. COMPARISON:  X-ray abdomen 05/10/2020, CT abdomen pelvis 04/07/2020, CT chest 04/27/2020 FINDINGS: Lower chest: Please see separately dictated CT chest 04/27/2020. Hepatobiliary: No focal liver abnormality is seen. Calcified gallstone within the gallbladder lumen. No gallbladder wall thickening. No biliary ductal dilatation. Pancreas: Unremarkable. No pancreatic ductal dilatation or surrounding inflammatory changes. Spleen: Normal in size without focal abnormality. Adrenals/Urinary Tract: No adrenal nodule bilaterally. Bilateral renal cortical scarring. No nephrolithiasis, no hydronephrosis, and no contour-deforming renal mass. No ureterolithiasis or hydroureter. The urinary bladder is unremarkable. Stomach/Bowel: Small hiatal hernia. No PO contrast extravasation from the partially opacified lumen of the small bowel and rectum. The small bowel is distended with fluid and gas measuring up to 3.3 cm in caliber on coronal imaging. No associated transition point. No small bowel wall thickening. No pneumatosis. Diffuse sigmoid diverticulosis with mild bowel wall thickening of the distal descending colon/proximal sigmoid colon (no small large bowel obstruction. The appendix is unremarkable. Vascular/Lymphatic: No abdominal aorta or iliac aneurysm. Severe atherosclerotic plaque of the aorta and its branches. No abdominal, pelvic, or inguinal lymphadenopathy. Reproductive: Status post hysterectomy. No adnexal masses. Other: Moderate volume free gas within the abdomen. Several foci of gas along the proximal sigmoid colon as  well as along the left pericolic gutter. Trace simple free fluid within the abdomen and pelvis. No organized fluid collection. No high density simple free fluid within the abdomen or pelvis. Musculoskeletal: Diffuse at least moderate subcutaneus soft tissue edema. Diffuse decreased bone density. No suspicious lytic or blastic osseous lesions. No acute displaced fracture. Multilevel degenerative changes of the spine. Severe right hip degenerative changes with chronic appearing destruction of the right femoral head and associated posterolateral subluxation in relation to the acetabulum. At least mild degenerative changes of the left hip. IMPRESSION: 1. Moderate volume pneumoperitoneum likely arising from a perforated acute sigmoid/distal descending colon diverticulitis. Associated small volume simple free fluid. No associated organized fluid collection. 2. Mild diffuse small bowel distension with no associated transition point suggestive of an ileus. 3. Diffuse at least moderate subcutaneus soft tissue edema. 4.  Aortic Atherosclerosis (ICD10-I70.0) - severe. 5. Please see separately dictated CT chest 05/19/2020. These results were called by telephone at the time of interpretation on 05/12/2020  at 10:21 pm to provider Lang Snow, who verbally acknowledged these results. Electronically Signed   By: Iven Finn M.D.   On: 05/17/2020 22:24   CT CHEST WO CONTRAST  Result Date: 05/01/2020 CLINICAL DATA:  COPD exacerbation, generalized weakness, sepsis EXAM: CT CHEST WITHOUT CONTRAST TECHNIQUE: Multidetector CT imaging of the chest was performed following the standard protocol without IV contrast. COMPARISON:  04/28/2020 FINDINGS: Cardiovascular: There is extensive atherosclerosis of the aorta and coronary vasculature. No pericardial effusion. The heart is not enlarged. Mediastinum/Nodes: No enlarged mediastinal or axillary lymph nodes. Thyroid gland, trachea, and esophagus demonstrate no significant findings.  Lungs/Pleura: There is diffuse emphysema. Small bilateral pleural effusions are identified, left greater than right. Dense left lower lobe consolidation with opacification of the left lower lobe segmental bronchi, favor atelectasis. No pneumothorax. Upper Abdomen: Please refer to CT abdomen report for important findings in that region related to pneumoperitoneum. Musculoskeletal: No acute or destructive bony lesions. Reconstructed images demonstrate no additional findings. IMPRESSION: 1. Left lower lobe consolidation and opacification of left lower lobe bronchi, favor atelectasis. 2. Small bilateral pleural effusions, left greater than right. 3. Aortic Atherosclerosis (ICD10-I70.0) and Emphysema (ICD10-J43.9). 4. Please refer to separately reported CT abdomen and pelvis exam describing important findings in that region. Electronically Signed   By: Randa Ngo M.D.   On: 04/22/2020 22:08   DG Abd Portable 1V  Result Date: 05/17/2020 CLINICAL DATA:  Generalized abdominal pain. EXAM: PORTABLE ABDOMEN - 1 VIEW COMPARISON:  None. FINDINGS: Mildly dilated small bowel loops are noted concerning for distal small bowel obstruction or ileus. No colonic dilatation is noted. No radio-opaque calculi or other significant radiographic abnormality are seen. IMPRESSION: Mildly dilated small bowel loops are noted concerning for distal small bowel obstruction or ileus. Electronically Signed   By: Marijo Conception M.D.   On: 04/24/2020 14:34   [place] EKG SITE RITE  Result Date: 04/21/2020 If Site Rite image not attached, placement could not be confirmed due to current cardiac rhythm.       Scheduled Meds: . aspirin  81 mg Oral Daily  . Chlorhexidine Gluconate Cloth  6 each Topical Daily  . heparin  5,000 Units Subcutaneous Q8H  . ipratropium-albuterol  3 mL Nebulization TID  . levothyroxine  100 mcg Oral Q0600  . methylPREDNISolone (SOLU-MEDROL) injection  40 mg Intravenous Q12H  . mometasone-formoterol  1 puff  Inhalation BID  . sodium chloride flush  3 mL Intravenous Q12H   Continuous Infusions: . ceFEPime (MAXIPIME) IV 2 g (04/22/20 0949)  . dextrose 5 % and 0.9% NaCl 75 mL/hr at 04/21/20 2309  . metronidazole 500 mg (04/22/20 0618)          Aline August, MD [facility] 04/22/2020, 12:16 PM

## 2020-04-22 NOTE — Plan of Care (Signed)
°  Problem: Education: Goal: Knowledge of General Education information will improve Description: Including pain rating scale, medication(s)/side effects and non-pharmacologic comfort measures Outcome: Progressing   Problem: Health Behavior/Discharge Planning: Goal: Ability to manage health-related needs will improve Outcome: Progressing   Problem: Clinical Measurements: Goal: Ability to maintain clinical measurements within normal limits will improve Outcome: Progressing Goal: Will remain free from infection Outcome: Progressing Goal: Diagnostic test results will improve Outcome: Progressing Goal: Respiratory complications will improve Outcome: Progressing Goal: Cardiovascular complication will be avoided Outcome: Progressing   Problem: Activity: Goal: Risk for activity intolerance will decrease Outcome: Progressing   Problem: Nutrition: Goal: Adequate nutrition will be maintained Outcome: Progressing   Problem: Pain Managment: Goal: General experience of comfort will improve Outcome: Progressing   Problem: Safety: Goal: Ability to remain free from injury will improve Outcome: Progressing   Problem: Skin Integrity: Goal: Risk for impaired skin integrity will decrease Outcome: Progressing

## 2020-04-22 NOTE — Progress Notes (Addendum)
Diverticulitis  Subjective: More abd pain yesterday, feels better this am  Objective: Vital signs in last 24 hours: Temp:  [97.7 F (36.5 C)-98 F (36.7 C)] 98 F (36.7 C) (11/03 0628) Pulse Rate:  [82-96] 91 (11/03 0728) Resp:  [16-18] 17 (11/03 0728) BP: (110-134)/(50-65) 134/65 (11/03 0628) SpO2:  [90 %-95 %] 95 % (11/03 0728) Weight:  [72.7 kg] 72.7 kg (11/03 0500) Last BM Date: 05/11/2020  Intake/Output from previous day: 11/02 0701 - 11/03 0700 In: 1305.9 [I.V.:849.8; IV Piggyback:456.1] Out: 1100 [Urine:1100] Intake/Output this shift: No intake/output data recorded.  General appearance: alert and cooperative GI: normal findings: LLQ TTP  Lab Results:  Results for orders placed or performed during the hospital encounter of 05/18/2020 (from the past 24 hour(s))  Glucose, capillary     Status: Abnormal   Collection Time: 04/21/20 11:33 AM  Result Value Ref Range   Glucose-Capillary 170 (H) 70 - 99 mg/dL  Glucose, capillary     Status: Abnormal   Collection Time: 04/21/20  4:31 PM  Result Value Ref Range   Glucose-Capillary 164 (H) 70 - 99 mg/dL  Glucose, capillary     Status: Abnormal   Collection Time: 04/21/20  8:21 PM  Result Value Ref Range   Glucose-Capillary 155 (H) 70 - 99 mg/dL  Procalcitonin - Baseline     Status: None   Collection Time: 04/21/20 10:38 PM  Result Value Ref Range   Procalcitonin 0.23 ng/mL  Lactic acid, plasma     Status: None   Collection Time: 04/21/20 10:38 PM  Result Value Ref Range   Lactic Acid, Venous 1.3 0.5 - 1.9 mmol/L  Potassium     Status: None   Collection Time: 04/21/20 10:53 PM  Result Value Ref Range   Potassium 4.0 3.5 - 5.1 mmol/L  Magnesium     Status: None   Collection Time: 04/21/20 10:53 PM  Result Value Ref Range   Magnesium 2.0 1.7 - 2.4 mg/dL  Phosphorus     Status: Abnormal   Collection Time: 04/21/20 10:53 PM  Result Value Ref Range   Phosphorus 2.3 (L) 2.5 - 4.6 mg/dL  Comprehensive metabolic panel      Status: Abnormal   Collection Time: 04/22/20  4:05 AM  Result Value Ref Range   Sodium 131 (L) 135 - 145 mmol/L   Potassium 4.0 3.5 - 5.1 mmol/L   Chloride 92 (L) 98 - 111 mmol/L   CO2 32 22 - 32 mmol/L   Glucose, Bld 159 (H) 70 - 99 mg/dL   BUN 18 8 - 23 mg/dL   Creatinine, Ser 0.92 0.44 - 1.00 mg/dL   Calcium 8.4 (L) 8.9 - 10.3 mg/dL   Total Protein 5.3 (L) 6.5 - 8.1 g/dL   Albumin 2.6 (L) 3.5 - 5.0 g/dL   AST 23 15 - 41 U/L   ALT 23 0 - 44 U/L   Alkaline Phosphatase 91 38 - 126 U/L   Total Bilirubin 0.8 0.3 - 1.2 mg/dL   GFR, Estimated >60 >60 mL/min   Anion gap 7 5 - 15  Magnesium     Status: None   Collection Time: 04/22/20  4:05 AM  Result Value Ref Range   Magnesium 2.0 1.7 - 2.4 mg/dL  Phosphorus     Status: Abnormal   Collection Time: 04/22/20  4:05 AM  Result Value Ref Range   Phosphorus 2.2 (L) 2.5 - 4.6 mg/dL  CBC with Differential/Platelet     Status: Abnormal   Collection  Time: 04/22/20  4:05 AM  Result Value Ref Range   WBC 9.2 4.0 - 10.5 K/uL   RBC 2.57 (L) 3.87 - 5.11 MIL/uL   Hemoglobin 8.1 (L) 12.0 - 15.0 g/dL   HCT 24.9 (L) 36 - 46 %   MCV 96.9 80.0 - 100.0 fL   MCH 31.5 26.0 - 34.0 pg   MCHC 32.5 30.0 - 36.0 g/dL   RDW 14.1 11.5 - 15.5 %   Platelets 247 150 - 400 K/uL   nRBC 0.0 0.0 - 0.2 %   Neutrophils Relative % 90 %   Neutro Abs 8.3 (H) 1.7 - 7.7 K/uL   Lymphocytes Relative 5 %   Lymphs Abs 0.4 (L) 0.7 - 4.0 K/uL   Monocytes Relative 4 %   Monocytes Absolute 0.3 0.1 - 1.0 K/uL   Eosinophils Relative 0 %   Eosinophils Absolute 0.0 0.0 - 0.5 K/uL   Basophils Relative 0 %   Basophils Absolute 0.0 0.0 - 0.1 K/uL   Immature Granulocytes 1 %   Abs Immature Granulocytes 0.05 0.00 - 0.07 K/uL  Procalcitonin     Status: None   Collection Time: 04/22/20  4:05 AM  Result Value Ref Range   Procalcitonin 0.27 ng/mL  Lactic acid, plasma     Status: None   Collection Time: 04/22/20  4:05 AM  Result Value Ref Range   Lactic Acid, Venous 1.2 0.5 -  1.9 mmol/L  Glucose, capillary     Status: Abnormal   Collection Time: 04/22/20  7:44 AM  Result Value Ref Range   Glucose-Capillary 119 (H) 70 - 99 mg/dL     Studies/Results Radiology     MEDS, Scheduled . aspirin  81 mg Oral Daily  . Chlorhexidine Gluconate Cloth  6 each Topical Daily  . heparin  5,000 Units Subcutaneous Q8H  . ipratropium-albuterol  3 mL Nebulization TID  . levothyroxine  100 mcg Oral Q0600  . methylPREDNISolone (SOLU-MEDROL) injection  40 mg Intravenous Q12H  . mometasone-formoterol  1 puff Inhalation BID  . sodium chloride flush  3 mL Intravenous Q12H     Assessment: Diverticulitis with perforation, pain concentrated to LLQ.  WBC improved   Plan: Cont IV abx.  Cont NPO for now.  [place] for sips of clears.  Pt is a poor surgical candidate  LOS: 2 days    Rosario Adie, MD [facility], [place]    04/22/2020 10:19 AM

## 2020-04-23 DIAGNOSIS — A419 Sepsis, unspecified organism: Secondary | ICD-10-CM | POA: Diagnosis not present

## 2020-04-23 DIAGNOSIS — R109 Unspecified abdominal pain: Secondary | ICD-10-CM | POA: Diagnosis not present

## 2020-04-23 DIAGNOSIS — Z7189 Other specified counseling: Secondary | ICD-10-CM | POA: Diagnosis not present

## 2020-04-23 DIAGNOSIS — N179 Acute kidney failure, unspecified: Secondary | ICD-10-CM | POA: Diagnosis not present

## 2020-04-23 DIAGNOSIS — I5042 Chronic combined systolic (congestive) and diastolic (congestive) heart failure: Secondary | ICD-10-CM | POA: Diagnosis not present

## 2020-04-23 DIAGNOSIS — K5720 Diverticulitis of large intestine with perforation and abscess without bleeding: Secondary | ICD-10-CM | POA: Diagnosis not present

## 2020-04-23 DIAGNOSIS — J9611 Chronic respiratory failure with hypoxia: Secondary | ICD-10-CM | POA: Diagnosis not present

## 2020-04-23 LAB — CBC WITH DIFFERENTIAL/PLATELET
Abs Immature Granulocytes: 0.13 10*3/uL — ABNORMAL HIGH (ref 0.00–0.07)
Basophils Absolute: 0 10*3/uL (ref 0.0–0.1)
Basophils Relative: 0 %
Eosinophils Absolute: 0 10*3/uL (ref 0.0–0.5)
Eosinophils Relative: 0 %
HCT: 26.5 % — ABNORMAL LOW (ref 36.0–46.0)
Hemoglobin: 8.4 g/dL — ABNORMAL LOW (ref 12.0–15.0)
Immature Granulocytes: 1 %
Lymphocytes Relative: 6 %
Lymphs Abs: 0.8 10*3/uL (ref 0.7–4.0)
MCH: 31.2 pg (ref 26.0–34.0)
MCHC: 31.7 g/dL (ref 30.0–36.0)
MCV: 98.5 fL (ref 80.0–100.0)
Monocytes Absolute: 0.7 10*3/uL (ref 0.1–1.0)
Monocytes Relative: 6 %
Neutro Abs: 11.8 10*3/uL — ABNORMAL HIGH (ref 1.7–7.7)
Neutrophils Relative %: 87 %
Platelets: 280 10*3/uL (ref 150–400)
RBC: 2.69 MIL/uL — ABNORMAL LOW (ref 3.87–5.11)
RDW: 14.6 % (ref 11.5–15.5)
WBC: 13.4 10*3/uL — ABNORMAL HIGH (ref 4.0–10.5)
nRBC: 0 % (ref 0.0–0.2)

## 2020-04-23 LAB — PROCALCITONIN: Procalcitonin: 0.19 ng/mL

## 2020-04-23 LAB — COMPREHENSIVE METABOLIC PANEL
ALT: 24 U/L (ref 0–44)
AST: 26 U/L (ref 15–41)
Albumin: 2.5 g/dL — ABNORMAL LOW (ref 3.5–5.0)
Alkaline Phosphatase: 90 U/L (ref 38–126)
Anion gap: 7 (ref 5–15)
BUN: 15 mg/dL (ref 8–23)
CO2: 31 mmol/L (ref 22–32)
Calcium: 8.5 mg/dL — ABNORMAL LOW (ref 8.9–10.3)
Chloride: 95 mmol/L — ABNORMAL LOW (ref 98–111)
Creatinine, Ser: 0.81 mg/dL (ref 0.44–1.00)
GFR, Estimated: >60 mL/min (ref >60)
Glucose, Bld: 128 mg/dL — ABNORMAL HIGH (ref 70–99)
Potassium: 3.9 mmol/L (ref 3.5–5.1)
Sodium: 133 mmol/L — ABNORMAL LOW (ref 135–145)
Total Bilirubin: 0.5 mg/dL (ref 0.3–1.2)
Total Protein: 5.3 g/dL — ABNORMAL LOW (ref 6.5–8.1)

## 2020-04-23 LAB — MAGNESIUM: Magnesium: 2.1 mg/dL (ref 1.7–2.4)

## 2020-04-23 MED ORDER — METHYLPREDNISOLONE SODIUM SUCC 40 MG IJ SOLR
40.0000 mg | Freq: Every day | INTRAMUSCULAR | Status: DC
  Administered 2020-04-23 – 2020-04-24 (×2): 40 mg via INTRAVENOUS
  Filled 2020-04-23 (×2): qty 1

## 2020-04-23 MED ORDER — FUROSEMIDE 10 MG/ML IJ SOLN
60.0000 mg | Freq: Once | INTRAMUSCULAR | Status: AC
  Administered 2020-04-23: 60 mg via INTRAVENOUS
  Filled 2020-04-23: qty 6

## 2020-04-23 NOTE — Progress Notes (Signed)
Physical Therapy Treatment Patient Details Name: [name] MRN: [id number] DOB: 1932-07-06 Today's Date: 04/23/2020    History of Present Illness 86 year old female with a past medical history of chronic hypoxic respiratory failure on 3 L/min, chronic hyponatremia COPD, hyperlipidemia, combined systolic and diastolic heart failure, hypothyroidism who was recently admitted from 10/19-10/29 with multiple medical issues including alcohol withdrawal seizure, NSTEMI (troponin up to 34,000), COPD exacerbation, secondary to CAP and hyponatremia who was discharged to SNF and presents today with complaints of sore throat and chest tightness. Dx of diverticulitis    PT Comments    Assisted OOB to [facility] pt did have a small BM then assisted back to bed due to dyspnea and overall feeling "no energy" MAX fatigue/weakness.  Pt required + 2 asisst with much difficulty.  Pt will need ST Rehab at SNF.   Follow Up Recommendations  SNF     Equipment Recommendations  None recommended by PT    Recommendations for Other Services       Precautions / Restrictions Precautions Precautions: Fall Precaution Comments: hx falls, HOME 02 3 lts, R LE weakness Restrictions Weight Bearing Restrictions: No    Mobility  Bed Mobility Overal bed mobility: Needs Assistance Bed Mobility: Supine to Sit;Sit to Supine     Supine to sit: Max assist;HOB elevated;+2 for physical assistance;+2 for safety/equipment Sit to supine: Total assist;+2 for safety/equipment;+2 for physical assistance   General bed mobility comments: assisted to EOB + 2 assist and use of bed pad to complete scooting to EOB.  Pt feeling very weak, dizzy and prersent with increased dyspnea.  Sats avg 96% on 3 lts.  Transfers Overall transfer level: Needs assistance Equipment used: None Transfers: Stand Pivot Transfers Sit to Stand: Mod assist;+2 physical assistance;+2 safety/equipment Stand pivot transfers: Mod assist;+2 physical  assistance;+2 safety/equipment       General transfer comment: assisted from elevated bed to [facility] then back to bed with 50% VC's on proper hand placement and transfer.  Increased assist to complete pivot.  Ambulation/Gait             General Gait Details: transfers only this session do to + 2 assist and inability to support her own weight.  MAX c/o feeling tired, weakn and present with dizziness.   Stairs             Wheelchair Mobility    Modified Rankin (Stroke Patients Only)       Balance                                            Cognition Arousal/Alertness: Awake/alert Behavior During Therapy: WFL for tasks assessed/performed Overall Cognitive Status: Within Functional Limits for tasks assessed                                 General Comments: AxO x 2      Exercises      General Comments        Pertinent Vitals/Pain Pain Assessment: No/denies pain    Home Living                      Prior Function            PT Goals (current goals can now be found in the care plan section) Progress towards  PT goals: Progressing toward goals    Frequency    Min 2X/week      PT Plan Current plan remains appropriate    Co-evaluation              AM-PAC PT "6 Clicks" Mobility   Outcome Measure  Help needed turning from your back to your side while in a flat bed without using bedrails?: A Lot Help needed moving from lying on your back to sitting on the side of a flat bed without using bedrails?: A Lot Help needed moving to and from a bed to a chair (including a wheelchair)?: Total Help needed standing up from a chair using your arms (e.g., wheelchair or bedside chair)?: Total Help needed to walk in hospital room?: Total Help needed climbing 3-5 steps with a railing? : Total 6 Click Score: 8    End of Session Equipment Utilized During Treatment: Gait belt;Oxygen Activity Tolerance: Patient limited by  fatigue Patient left: with call bell/phone within reach Nurse Communication: Mobility status PT Visit Diagnosis: Unsteadiness on feet (R26.81);Muscle weakness (generalized) (M62.81);Difficulty in walking, not elsewhere classified (R26.2)     Time: 1050-1120 PT Time Calculation (min) (ACUTE ONLY): 30 min  Charges:  $Therapeutic Activity: 23-37 mins                     Rica Koyanagi  PTA Acute  Rehabilitation Services Pager      [phone] Office      [phone]

## 2020-04-23 NOTE — Consult Note (Signed)
Palliative care consult note  Reason for consult: Goals of care in light of sepsis related to diverticulitis with perforation  Palliative care consult received.  Chart reviewed including personal review of pertinent labs and imaging.  Briefly, [name] is an 86 year old female with past medical history of COPD, chronic hypoxic respiratory failure on 3 L, chronic hyponatremia, hyperlipidemia, chronic systolic and diastolic heart failure, hypothyroidism, recent admission from 10/19-10/29 for multiple issues including NSTEMI, COPD exacerbation, CAP, alcohol withdrawal, and hyponatremia who was discharged to skilled facility and represented on 11/1 with sore throat, lethargy poor intake and chest tightness.  She was found to be hypotensive with increased oxygen requirement and CT of the abdomen and pelvis showed moderate volume pneumoperitoneum from possibly perforated sigmoid/distal descending colon diverticulitis.  She has been on broad-spectrum antibiotics general surgery has been consulted.  Recommendation is for conservative management.  Palliative consulted for goals of care.  I met today briefly with [name].  She was awake and alert and lying in bed.  She is in no distress.  I introduced palliative care as specialized medical care for people living with serious illness. It focuses on providing relief from the symptoms and stress of a serious illness. The goal is to improve quality of life for both the patient and the family.  She reports that the things most important to her are her family and being in her home.  She mentioned several times wanting to be at home with her husband.  She recently missed her birthday as well as her anniversary as she was in the hospital or skilled facility for both of these recently.  We reviewed her clinical course this hospitalization as well as events of the past few weeks.  She tells me the doctors have been explaining things to her and is able to tell me that  she has diverticulitis with perforation but states she is feeling much better today than yesterday.  We talked about pathways forward for care and she agrees that much of the next steps will depend upon her clinical course of the next couple of days.  We talked about limits of care and she expressed desire to avoid heroic interventions in the event of natural death.  She again expressed her desire to be at home.  We talked about continuing to see how she does over the next couple of days and progress conversation regarding goals based upon her clinical course.  Recommendations: -DNR/DNI -Continue current interventions and will continue to assess her overall clinical course over the next 24 to 48 hours. -Discussed plan for follow-up tomorrow.  Hopefully I will be able to catch family when they come to visit as well.  If not, we will plan to set up formal meeting with family (daughter in particular) in the next day or 2.  Start time: 1800 End time: 1840  Total time: 40 minutes  Greater than 50%  of this time was spent counseling and coordinating care related to the above assessment and plan.

## 2020-04-23 NOTE — Progress Notes (Signed)
PHARMACY NOTE -  cefepime  Pharmacy has been assisting with dosing of cefepime for diverticulitis.  Dosage remains stable at 2g IV q12 hr and need for further dosage adjustment appears unlikely at present given SCr returned to baseline  Pharmacy will sign off, following peripherally for culture results or dose adjustments. Please reconsult if a change in clinical status warrants re-evaluation of dosage.  Reuel Boom, PharmD, BCPS [phone] 04/23/2020, 1:17 PM

## 2020-04-23 NOTE — Progress Notes (Addendum)
Palliative care progress note  Reason for visit: Goals of care in light of Sepsis related to diverticulitis with perforation  I saw and examined [name] today.  Her daughter [name] was at the bedside as well.  We again discussed things most important to [name].  She states that being able to eventually return to her own home continues to be the most important thing for her.  She again expressed being frustrated that she cannot be at home with her husband. she has also frustrated by the fact that she cannot have anything to eat right now.  She feels that she is going to continue to get weaker without nutrition.  We discussed her current clinical course and reasoning for n.p.o. status.  We also discussed her multiple comorbidities and concern that she is a poor candidate for any surgical interventions if her condition were to worsen rather than improve.  Her daughter certainly understands the situation and [name] reports understanding why she cannot have food at this time.  Her daughter understands that she is seriously ill but wants to ensure that it is understood they are not at a point of "giving up."    We discussed care plan moving forward which includes continuation of current interventions to see how her body continues to respond.  Discussed again that if she does decline despite current interventions, she is not a good candidate for surgical intervention.  Both patient and family understand this fact.  At the same time, her daughter states that hospice was mentioned and she is upset as she feels that her mother needs the opportunity to improve prior to any discussion about consideration for transition to a comfort care approach.  Recommendations: -DNR/DNI -Continue current interventions and will continue to assess her overall clinical course over the next few days.  Start time: 1430 End time: 1510 Total time: 40 minutes  Greater than 50%  of this time was spent counseling and  coordinating care related to the above assessment and plan.  [name], MD [place] [facility] [phone]

## 2020-04-23 NOTE — TOC Progression Note (Signed)
Transition of Care [facility]) - Progression Note    Patient Details  Name: [name] MRN: [id number] Date of Birth: 04-Oct-1932  Transition of Care [facility]) CM/SW Contact  Yarely Bebee, Juliann Pulse, RN Phone Number: 04/23/2020, 3:09 PM  Clinical Narrative: Noted palliative care following.Continue to monitor for d/c plans.        Barriers to Discharge: Continued Medical Work up  Expected Discharge Plan and Services           Expected Discharge Date:  (unknown)                                     Social Determinants of Health (SDOH) Interventions    Readmission Risk Interventions Readmission Risk Prevention Plan 04/21/2020  Transportation Screening Complete  Medication Review [employer]) Complete  HRI or Home Care Consult Complete  SW Recovery Care/Counseling Consult Complete  Palliative Care Screening Complete  Skilled Nursing Facility Complete  Some recent data might be hidden

## 2020-04-23 NOTE — Progress Notes (Signed)
Diverticulitis  Subjective: She feels her pain is better  Objective: Vital signs in last 24 hours: Temp:  [97.8 F (36.6 C)-98 F (36.7 C)] 98 F (36.7 C) (11/04 0635) Pulse Rate:  [96] 96 (11/04 0635) Resp:  [16-18] 16 (11/04 0635) BP: (147-156)/(71-89) 156/89 (11/04 0635) SpO2:  [88 %-98 %] 98 % (11/04 0635) Weight:  [72.4 kg] 72.4 kg (11/04 0500) Last BM Date: 05/03/2020  Intake/Output from previous day: 11/03 0701 - 11/04 0700 In: 2240.9 [I.V.:1740.9; IV Piggyback:500] Out: 400 [Urine:400] Intake/Output this shift: No intake/output data recorded.  General appearance: alert and cooperative GI: normal findings: less TTP LLQ  Lab Results:  Results for orders placed or performed during the hospital encounter of 04/26/2020 (from the past 24 hour(s))  Glucose, capillary     Status: Abnormal   Collection Time: 04/22/20 11:25 AM  Result Value Ref Range   Glucose-Capillary 131 (H) 70 - 99 mg/dL  Glucose, capillary     Status: Abnormal   Collection Time: 04/22/20  4:16 PM  Result Value Ref Range   Glucose-Capillary 122 (H) 70 - 99 mg/dL  Glucose, capillary     Status: Abnormal   Collection Time: 04/22/20  8:45 PM  Result Value Ref Range   Glucose-Capillary 150 (H) 70 - 99 mg/dL  Comprehensive metabolic panel     Status: Abnormal   Collection Time: 04/23/20  3:59 AM  Result Value Ref Range   Sodium 133 (L) 135 - 145 mmol/L   Potassium 3.9 3.5 - 5.1 mmol/L   Chloride 95 (L) 98 - 111 mmol/L   CO2 31 22 - 32 mmol/L   Glucose, Bld 128 (H) 70 - 99 mg/dL   BUN 15 8 - 23 mg/dL   Creatinine, Ser 0.81 0.44 - 1.00 mg/dL   Calcium 8.5 (L) 8.9 - 10.3 mg/dL   Total Protein 5.3 (L) 6.5 - 8.1 g/dL   Albumin 2.5 (L) 3.5 - 5.0 g/dL   AST 26 15 - 41 U/L   ALT 24 0 - 44 U/L   Alkaline Phosphatase 90 38 - 126 U/L   Total Bilirubin 0.5 0.3 - 1.2 mg/dL   GFR, Estimated >60 >60 mL/min   Anion gap 7 5 - 15  Magnesium     Status: None   Collection Time: 04/23/20  3:59 AM  Result Value Ref  Range   Magnesium 2.1 1.7 - 2.4 mg/dL  CBC with Differential/Platelet     Status: Abnormal   Collection Time: 04/23/20  3:59 AM  Result Value Ref Range   WBC 13.4 (H) 4.0 - 10.5 K/uL   RBC 2.69 (L) 3.87 - 5.11 MIL/uL   Hemoglobin 8.4 (L) 12.0 - 15.0 g/dL   HCT 26.5 (L) 36 - 46 %   MCV 98.5 80.0 - 100.0 fL   MCH 31.2 26.0 - 34.0 pg   MCHC 31.7 30.0 - 36.0 g/dL   RDW 14.6 11.5 - 15.5 %   Platelets 280 150 - 400 K/uL   nRBC 0.0 0.0 - 0.2 %   Neutrophils Relative % 87 %   Neutro Abs 11.8 (H) 1.7 - 7.7 K/uL   Lymphocytes Relative 6 %   Lymphs Abs 0.8 0.7 - 4.0 K/uL   Monocytes Relative 6 %   Monocytes Absolute 0.7 0.1 - 1.0 K/uL   Eosinophils Relative 0 %   Eosinophils Absolute 0.0 0.0 - 0.5 K/uL   Basophils Relative 0 %   Basophils Absolute 0.0 0.0 - 0.1 K/uL   Immature Granulocytes  1 %   Abs Immature Granulocytes 0.13 (H) 0.00 - 0.07 K/uL  Procalcitonin     Status: None   Collection Time: 04/23/20  3:59 AM  Result Value Ref Range   Procalcitonin 0.19 ng/mL     Studies/Results Radiology     MEDS, Scheduled . aspirin  81 mg Oral Daily  . Chlorhexidine Gluconate Cloth  6 each Topical Daily  . heparin  5,000 Units Subcutaneous Q8H  . ipratropium-albuterol  3 mL Nebulization TID  . levothyroxine  100 mcg Oral Q0600  . mometasone-formoterol  1 puff Inhalation BID  . sodium chloride flush  3 mL Intravenous Q12H     Assessment: Diverticulitis with perforation, pain concentrated to LLQ.  WBC up today   Plan: Cont IV abx.  Cont NPO for now.  [place] for sips of clears.  Pt is a poor surgical candidate  LOS: 3 days    Rosario Adie, [place] [facility], [place]    04/23/2020 8:58 AM

## 2020-04-23 NOTE — Progress Notes (Signed)
Patient ID: [name], female   DOB: 1932/08/26, 86 y.o.   MRN: [id number]  PROGRESS NOTE    [name]  NFA:[id number] DOB: 26-Jul-1932 DOA: 05/10/2020 PCP: Binnie Rail, MD   Brief Narrative:  86 year old female with history of COPD, chronic hypoxic respiratory failure on 3 L/min oxygen via nasal cannula, chronic hyponatremia, hyperlipidemia, chronic systolic and diastolic heart failure, hypothyroidism, recent admission from 04/07/2020-04/17/2020 with multiple medical issues including alcohol withdrawal seizure, non-STEMI, COPD exacerbation, CAP and hyponatremia with subsequent discharge to SNF presented on 04/21/2020 with sore throat, lethargy, poor oral intake and chest tightness.  On presentation, she was hypotensive, requiring 5 L oxygen via nasal cannula with sodium of 124, creatinine 1.38, troponin flat at 20x2, WBC of 15.5.  COVID-19 and influenza test were negative and throat swab was negative for strep.  CT of the abdomen and pelvis showed moderate volume pneumoperitoneum possibly from perforated acute sigmoid/distal descending colon diverticulitis with possible ileus.  CT of the chest showed left lower lobe consolidation and opacification of left lower lobe bronchi, favor atelectasis along with bilateral small pleural effusions.  She was started on broad-spectrum antibiotics.  General surgery was consulted; recommended conservative management.  PCCM was also consulted.  Assessment & Plan:   Severe sepsis: Present on admission Acute diverticulitis of sigmoid/descending colon with perforation and pneumoperitoneum -General surgery following and recommending conservative management.  Diet advancement as per general surgery. -Continue cefepime and Flagyl.  Off vancomycin.  Continue IV fluids. -Palliative care consultation for goals of care discussion and possible hospice/comfort measures if condition does not improve  COPD/emphysema Chronic hypoxic respiratory failure on 3 L  oxygen via nasal cannula at home Bilateral pleural effusion -Continue Dulera and nebs.  Solu-Medrol DC'd on 04/22/2020.  Currently wheezing, will restart Solu-Medrol 40 mg IV daily. -Continue oxygen supplementation.  Currently still on 4 L oxygen -PCCM has signed off.  Chronic systolic and diastolic heart failure Recent non-STEMI -EF of 45 to 45% in the last echo on 04/07/2020 -Strict input output.  Daily weights.  Currently compensated.  Positive fluid balance of 3840.7 cc since admission.  Will give 1 dose of intravenous Lasix today.  Hypertension -Monitor blood pressure.  Blood pressure medications on hold  Leukocytosis -WBC has again increased to 13.4 today from 9.2 yesterday.  Monitor  Hypokalemia -Resolved  Acute on chronic hyponatremia -Baseline sodium of 132.  Sodium 133 today.  Monitor.  AKI -Improving.  Creatinine 0.9 today.  Generalized deconditioning -Palliative care consultation is pending.  PT recommends SNF placement.  Overall course is very poor.    DVT prophylaxis: Subcutaneous heparin Code Status: DNR Family Communication: Spoke to daughter on phone on 04/22/2020 Disposition Plan: Status is: Inpatient  Remains inpatient appropriate because:Inpatient level of care appropriate due to severity of illness   Dispo: The patient is from: SNF              Anticipated d/c is to: SNF              Anticipated d/c date is: > 3 days              Patient currently is not medically stable to d/c.  Consultants: General surgery/PCCM/palliative care  Procedures: None  Antimicrobials:  Anti-infectives (From admission, onward)   Start     Dose/Rate Route Frequency Ordered Stop   04/22/20 1000  vancomycin (VANCOCIN) IVPB 1000 mg/200 mL premix  Status:  Discontinued        1,000 mg 200 mL/hr  over 60 Minutes Intravenous Every 36 hours 05/05/2020 1453 04/21/20 1246   04/21/20 2200  ceFEPIme (MAXIPIME) 2 g in sodium chloride 0.9 % 100 mL IVPB        2 g 200 mL/hr over 30  Minutes Intravenous Every 12 hours 04/21/20 1246     04/21/20 1400  vancomycin (VANCOCIN) IVPB 1000 mg/200 mL premix  Status:  Discontinued        1,000 mg 200 mL/hr over 60 Minutes Intravenous Every 24 hours 04/21/20 1246 04/21/20 2230   04/21/20 1000  ceFEPIme (MAXIPIME) 2 g in sodium chloride 0.9 % 100 mL IVPB  Status:  Discontinued        2 g 200 mL/hr over 30 Minutes Intravenous Every 24 hours 05/08/2020 1453 04/21/20 1246   04/29/2020 2200  metroNIDAZOLE (FLAGYL) IVPB 500 mg        500 mg 100 mL/hr over 60 Minutes Intravenous Every 8 hours 05/15/2020 1340     05/14/2020 1345  ceFEPIme (MAXIPIME) 2 g in sodium chloride 0.9 % 100 mL IVPB  Status:  Discontinued        2 g 200 mL/hr over 30 Minutes Intravenous  Once 05/17/2020 1340 04/23/2020 1454   05/07/2020 1345  vancomycin (VANCOCIN) IVPB 1000 mg/200 mL premix  Status:  Discontinued        1,000 mg 200 mL/hr over 60 Minutes Intravenous  Once 04/24/2020 1340 05/13/2020 1454   05/15/2020 1115  ceFEPIme (MAXIPIME) 2 g in sodium chloride 0.9 % 100 mL IVPB        2 g 200 mL/hr over 30 Minutes Intravenous  Once 05/16/2020 1105 04/24/2020 1156   05/16/2020 1115  metroNIDAZOLE (FLAGYL) IVPB 500 mg        500 mg 100 mL/hr over 60 Minutes Intravenous  Once 05/14/2020 1105 05/08/2020 1442   04/23/2020 1115  vancomycin (VANCOCIN) IVPB 1000 mg/200 mL premix        1,000 mg 200 mL/hr over 60 Minutes Intravenous  Once 05/05/2020 1105 05/02/2020 1253      Subjective: Patient seen and examined at bedside.  Poor historian.  Does not feel well and complains of some increasing abdominal pain today.  No overnight fever or vomiting reported. Objective: Vitals:   04/22/20 1946 04/22/20 2048 04/23/20 0500 04/23/20 0635  BP:  (!) 147/71  (!) 156/89  Pulse:  96  96  Resp:  18  16  Temp:  97.8 F (36.6 C)  98 F (36.7 C)  TempSrc:  Oral  Oral  SpO2: 95% 95%  98%  Weight:   72.4 kg   Height:        Intake/Output Summary (Last 24 hours) at 04/23/2020 0743 Last data filed at  04/23/2020 0522 Gross per 24 hour  Intake 2240.86 ml  Output 400 ml  Net 1840.86 ml   Filed Weights   04/21/20 0426 04/22/20 0500 04/23/20 0500  Weight: 71 kg 72.7 kg 72.4 kg    Examination:  General exam: Chronically ill looking elderly female lying in bed.  No distress currently. Respiratory system: Breath sounds are decreased at bases with scattered crackles, no wheezing  cardiovascular system: Rate controlled, S1-S2 heard Gastrointestinal system: Abdomen is slightly distended, soft and mildly tender in the lower quadrant.  Sluggish bowel sounds  extremities: Mild lower extremity edema.  No clubbing Central nervous system: Awake and alert.  Poor historian and still slow to respond to questions.  No focal neurological deficits.  Moves extremities Skin: No obvious ecchymosis/lesions Psychiatry: Affect is  flat   Data Reviewed: I have personally reviewed following labs and imaging studies  CBC: Recent Labs  Lab 04/17/20 0419 05/04/2020 0748 04/21/20 0219 04/22/20 0405 04/23/20 0359  WBC 7.5 15.5* 10.5 9.2 13.4*  NEUTROABS 5.8 12.6*  --  8.3* 11.8*  HGB 9.6* 10.1* 8.8* 8.1* 8.4*  HCT 29.4* 31.2* 27.3* 24.9* 26.5*  MCV 97.7 97.5 97.2 96.9 98.5  PLT 161 212 195 247 253   Basic Metabolic Panel: Recent Labs  Lab 04/17/20 0419 04/17/20 0419 05/07/2020 0748 04/27/2020 0748 05/12/2020 1921 04/21/20 0219 04/21/20 2253 04/22/20 0405 04/23/20 0359  NA 132*   < > 124*  --  128* 129*  --  131* 133*  K 3.5   < > 3.6   < > 3.4* 3.3* 4.0 4.0 3.9  CL 81*   < > 79*  --  84* 89*  --  92* 95*  CO2 42*   < > 34*  --  32 30  --  32 31  GLUCOSE 93   < > 102*  --  101* 142*  --  159* 128*  BUN 14   < > 29*  --  24* 23  --  18 15  CREATININE 0.79   < > 1.38*  --  1.14* 1.02*  --  0.92 0.81  CALCIUM 8.5*   < > 8.3*  --  8.1* 7.4*  --  8.4* 8.5*  MG 2.1  --   --   --   --  1.9 2.0 2.0 2.1  PHOS  --   --   --   --   --  3.7 2.3* 2.2*  --    < > = values in this interval not displayed.    GFR: Estimated Creatinine Clearance: 51.7 mL/min (by C-G formula based on SCr of 0.81 mg/dL). Liver Function Tests: Recent Labs  Lab 04/17/20 0419 04/21/2020 0748 04/22/20 0405 04/23/20 0359  AST 43* 40 23 26  ALT 39 30 23 24   ALKPHOS 106 126 91 90  BILITOT 0.7 0.7 0.8 0.5  PROT 5.1* 5.3* 5.3* 5.3*  ALBUMIN 2.1* 2.2* 2.6* 2.5*   No results for input(s): LIPASE, AMYLASE in the last 168 hours. No results for input(s): AMMONIA in the last 168 hours. Coagulation Profile: Recent Labs  Lab 05/12/2020 0748  INR 1.0   Cardiac Enzymes: No results for input(s): CKTOTAL, CKMB, CKMBINDEX, TROPONINI in the last 168 hours. BNP (last 3 results) No results for input(s): PROBNP in the last 8760 hours. HbA1C: No results for input(s): HGBA1C in the last 72 hours. CBG: Recent Labs  Lab 04/21/20 2021 04/22/20 0744 04/22/20 1125 04/22/20 1616 04/22/20 2045  GLUCAP 155* 119* 131* 122* 150*   Lipid Profile: No results for input(s): CHOL, HDL, LDLCALC, TRIG, CHOLHDL, LDLDIRECT in the last 72 hours. Thyroid Function Tests: No results for input(s): TSH, T4TOTAL, FREET4, T3FREE, THYROIDAB in the last 72 hours. Anemia Panel: No results for input(s): VITAMINB12, FOLATE, FERRITIN, TIBC, IRON, RETICCTPCT in the last 72 hours. Sepsis Labs: Recent Labs  Lab 04/26/2020 1332 05/16/2020 1921 04/21/20 2238 04/22/20 0405 04/23/20 0359  PROCALCITON  --   --  0.23 0.27 0.19  LATICACIDVEN 2.0* 1.2 1.3 1.2  --     Recent Results (from the past 240 hour(s))  SARS Coronavirus 2 by RT PCR (hospital order, performed in [facility] hospital lab) Nasopharyngeal Nasopharyngeal Swab     Status: None   Collection Time: 04/16/20 11:00 PM   Specimen: Nasopharyngeal Swab  Result Value Ref Range Status   SARS Coronavirus 2 NEGATIVE NEGATIVE Final    Comment: (NOTE) SARS-CoV-2 target nucleic acids are NOT DETECTED.  The SARS-CoV-2 RNA is generally detectable in upper and lower respiratory specimens during the  acute phase of infection. The lowest concentration of SARS-CoV-2 viral copies this assay can detect is 250 copies / mL. A negative result does not preclude SARS-CoV-2 infection and should not be used as the sole basis for treatment or other patient management decisions.  A negative result may occur with improper specimen collection / handling, submission of specimen other than nasopharyngeal swab, presence of viral mutation(s) within the areas targeted by this assay, and inadequate number of viral copies (<250 copies / mL). A negative result must be combined with clinical observations, patient history, and epidemiological information.  Fact Sheet for Patients:   StrictlyIdeas.no  Fact Sheet for Healthcare Providers: BankingDealers.co.za  This test is not yet approved or  cleared by the [place] FDA and has been authorized for detection and/or diagnosis of SARS-CoV-2 by FDA under an Emergency Use Authorization (EUA).  This EUA will remain in effect (meaning this test can be used) for the duration of the COVID-19 declaration under Section 564(b)(1) of the Act, 21 U.S.C. section 360bbb-3(b)(1), unless the authorization is terminated or revoked sooner.  Performed at [facility], [place] [address], [place], [place] [address]   Blood Culture (routine x 2)     Status: None (Preliminary result)   Collection Time: 05/09/2020  7:48 AM   Specimen: BLOOD  Result Value Ref Range Status   Specimen Description   Final    BLOOD RIGHT ANTECUBITAL Performed at [place] [address] [place], [place] [address]    Special Requests   Final    BOTTLES DRAWN AEROBIC AND ANAEROBIC Blood Culture results may not be optimal due to an excessive volume of blood received in culture bottles Performed at [place] [address] [place], [place] [address]    Culture   Final    NO GROWTH 3  DAYS Performed at [facility], [place] [address], [place], [place] [address]    Report Status PENDING  Incomplete  Respiratory Panel by RT PCR (Flu A&B, Covid) - Nasopharyngeal Swab     Status: None   Collection Time: 05/01/2020  8:50 AM   Specimen: Nasopharyngeal Swab  Result Value Ref Range Status   SARS Coronavirus 2 by RT PCR NEGATIVE NEGATIVE Final    Comment: (NOTE) SARS-CoV-2 target nucleic acids are NOT DETECTED.  The SARS-CoV-2 RNA is generally detectable in upper respiratoy specimens during the acute phase of infection. The lowest concentration of SARS-CoV-2 viral copies this assay can detect is 131 copies/mL. A negative result does not preclude SARS-Cov-2 infection and should not be used as the sole basis for treatment or other patient management decisions. A negative result may occur with  improper specimen collection/handling, submission of specimen other than nasopharyngeal swab, presence of viral mutation(s) within the areas targeted by this assay, and inadequate number of viral copies (<131 copies/mL). A negative result must be combined with clinical observations, patient history, and epidemiological information. The expected result is Negative.  Fact Sheet for Patients:  PinkCheek.be  Fact Sheet for Healthcare Providers:  GravelBags.it  This test is no t yet approved or cleared by the [place] FDA and  has been authorized for detection and/or diagnosis of SARS-CoV-2 by FDA under an Emergency Use Authorization (EUA).  This EUA will remain  in effect (meaning this test can be used) for the duration of the COVID-19 declaration under Section 564(b)(1) of the Act, 21 U.S.C. section 360bbb-3(b)(1), unless the authorization is terminated or revoked sooner.     Influenza A by PCR NEGATIVE NEGATIVE Final   Influenza B by PCR NEGATIVE NEGATIVE Final    Comment: (NOTE) The Xpert Xpress SARS-CoV-2/FLU/RSV  assay is intended as an aid in  the diagnosis of influenza from Nasopharyngeal swab specimens and  should not be used as a sole basis for treatment. Nasal washings and  aspirates are unacceptable for Xpert Xpress SARS-CoV-2/FLU/RSV  testing.  Fact Sheet for Patients: PinkCheek.be  Fact Sheet for Healthcare Providers: GravelBags.it  This test is not yet approved or cleared by the [place] FDA and  has been authorized for detection and/or diagnosis of SARS-CoV-2 by  FDA under an Emergency Use Authorization (EUA). This EUA will remain  in effect (meaning this test can be used) for the duration of the  Covid-19 declaration under Section 564(b)(1) of the Act, 21  U.S.C. section 360bbb-3(b)(1), unless the authorization is  terminated or revoked. Performed at [facility], [place] [address], [place], [place] [address]   Group A Strep by PCR     Status: None   Collection Time: 05/17/2020  8:58 AM  Result Value Ref Range Status   Group A Strep by PCR NOT DETECTED NOT DETECTED Final    Comment: Performed at [facility], [place] [address], [place], [place] [address]  Blood Culture (routine x 2)     Status: None (Preliminary result)   Collection Time: 05/05/2020  9:17 AM   Specimen: BLOOD  Result Value Ref Range Status   Specimen Description   Final    BLOOD RIGHT ANTECUBITAL Performed at [place] [address], [place], [place] [address]    Special Requests   Final    BOTTLES DRAWN AEROBIC AND ANAEROBIC Blood Culture results may not be optimal due to an excessive volume of blood received in culture bottles Performed at [place] [address] [place], [place] [address]    Culture   Final    NO GROWTH 3 DAYS Performed at [facility], [place] [address] [place], [place] [address]    Report Status PENDING  Incomplete  Urine culture     Status:  None   Collection Time: 04/30/2020 12:37 PM   Specimen: In/Out Cath Urine  Result Value Ref Range Status   Specimen Description   Final    IN/OUT CATH URINE Performed at [place] [address], [place], [place] [address]    Special Requests   Final    NONE Performed at [facility], [place] [address] [place], [place] [address]    Culture   Final    NO GROWTH Performed at [facility], [place] [address], [place], [place] [address]    Report Status 04/21/2020 FINAL  Final  MRSA PCR Screening     Status: None   Collection Time: 05/10/2020  5:45 PM   Specimen: Nasal Mucosa; Nasopharyngeal  Result Value Ref Range Status   MRSA by PCR NEGATIVE NEGATIVE Final    Comment:        The GeneXpert MRSA Assay (FDA approved for NASAL specimens only), is one component of a comprehensive MRSA colonization surveillance program. It is not intended to diagnose MRSA infection nor to guide or  monitor treatment for MRSA infections. Performed at [facility], [place] [address] [place], [place] [address]   Respiratory Panel by PCR     Status: None   Collection Time: 04/26/2020  9:20 PM  Result Value Ref Range Status   Adenovirus NOT DETECTED NOT DETECTED Final   Coronavirus 229E NOT DETECTED NOT DETECTED Final    Comment: (NOTE) The Coronavirus on the Respiratory Panel, DOES NOT test for the novel  Coronavirus (2019 nCoV)    Coronavirus HKU1 NOT DETECTED NOT DETECTED Final   Coronavirus NL63 NOT DETECTED NOT DETECTED Final   Coronavirus OC43 NOT DETECTED NOT DETECTED Final   Metapneumovirus NOT DETECTED NOT DETECTED Final   Rhinovirus / Enterovirus NOT DETECTED NOT DETECTED Final   Influenza A NOT DETECTED NOT DETECTED Final   Influenza B NOT DETECTED NOT DETECTED Final   Parainfluenza Virus 1 NOT DETECTED NOT DETECTED Final   Parainfluenza Virus 2 NOT DETECTED NOT DETECTED Final   Parainfluenza Virus 3 NOT DETECTED NOT DETECTED Final    Parainfluenza Virus 4 NOT DETECTED NOT DETECTED Final   Respiratory Syncytial Virus NOT DETECTED NOT DETECTED Final   Bordetella pertussis NOT DETECTED NOT DETECTED Final   Chlamydophila pneumoniae NOT DETECTED NOT DETECTED Final   Mycoplasma pneumoniae NOT DETECTED NOT DETECTED Final    Comment: Performed at [facility], [place] [address] [place], [place] [address]         Radiology Studies: [place] EKG SITE RITE  Result Date: 04/21/2020 If Site Rite image not attached, placement could not be confirmed due to current cardiac rhythm.       Scheduled Meds: . aspirin  81 mg Oral Daily  . Chlorhexidine Gluconate Cloth  6 each Topical Daily  . heparin  5,000 Units Subcutaneous Q8H  . ipratropium-albuterol  3 mL Nebulization TID  . levothyroxine  100 mcg Oral Q0600  . mometasone-formoterol  1 puff Inhalation BID  . sodium chloride flush  3 mL Intravenous Q12H   Continuous Infusions: . ceFEPime (MAXIPIME) IV 2 g (04/22/20 2104)  . dextrose 5 % and 0.9% NaCl 20 mL/hr at 04/23/20 0742  . metronidazole 500 mg (04/23/20 0522)          Aline August, MD [facility] 04/23/2020, 7:43 AM

## 2020-04-24 ENCOUNTER — Inpatient Hospital Stay (HOSPITAL_COMMUNITY): Payer: PPO

## 2020-04-24 DIAGNOSIS — I5043 Acute on chronic combined systolic (congestive) and diastolic (congestive) heart failure: Secondary | ICD-10-CM

## 2020-04-24 DIAGNOSIS — R109 Unspecified abdominal pain: Secondary | ICD-10-CM | POA: Diagnosis not present

## 2020-04-24 DIAGNOSIS — A419 Sepsis, unspecified organism: Secondary | ICD-10-CM | POA: Diagnosis not present

## 2020-04-24 DIAGNOSIS — Z7189 Other specified counseling: Secondary | ICD-10-CM | POA: Diagnosis not present

## 2020-04-24 DIAGNOSIS — J9611 Chronic respiratory failure with hypoxia: Secondary | ICD-10-CM | POA: Diagnosis not present

## 2020-04-24 DIAGNOSIS — J441 Chronic obstructive pulmonary disease with (acute) exacerbation: Secondary | ICD-10-CM | POA: Diagnosis not present

## 2020-04-24 DIAGNOSIS — K5720 Diverticulitis of large intestine with perforation and abscess without bleeding: Secondary | ICD-10-CM | POA: Diagnosis not present

## 2020-04-24 LAB — TROPONIN I (HIGH SENSITIVITY)
Troponin I (High Sensitivity): 20 ng/L — ABNORMAL HIGH (ref <18)
Troponin I (High Sensitivity): 20 ng/L — ABNORMAL HIGH (ref <18)

## 2020-04-24 LAB — COMPREHENSIVE METABOLIC PANEL
ALT: 24 U/L (ref 0–44)
AST: 27 U/L (ref 15–41)
Albumin: 2.6 g/dL — ABNORMAL LOW (ref 3.5–5.0)
Alkaline Phosphatase: 91 U/L (ref 38–126)
Anion gap: 10 (ref 5–15)
BUN: 15 mg/dL (ref 8–23)
CO2: 30 mmol/L (ref 22–32)
Calcium: 8.5 mg/dL — ABNORMAL LOW (ref 8.9–10.3)
Chloride: 95 mmol/L — ABNORMAL LOW (ref 98–111)
Creatinine, Ser: 0.8 mg/dL (ref 0.44–1.00)
GFR, Estimated: >60 mL/min (ref >60)
Glucose, Bld: 106 mg/dL — ABNORMAL HIGH (ref 70–99)
Potassium: 2.9 mmol/L — ABNORMAL LOW (ref 3.5–5.1)
Sodium: 135 mmol/L (ref 135–145)
Total Bilirubin: 0.6 mg/dL (ref 0.3–1.2)
Total Protein: 5.3 g/dL — ABNORMAL LOW (ref 6.5–8.1)

## 2020-04-24 LAB — CBC WITH DIFFERENTIAL/PLATELET
Abs Immature Granulocytes: 0.13 10*3/uL — ABNORMAL HIGH (ref 0.00–0.07)
Basophils Absolute: 0 10*3/uL (ref 0.0–0.1)
Basophils Relative: 0 %
Eosinophils Absolute: 0 10*3/uL (ref 0.0–0.5)
Eosinophils Relative: 0 %
HCT: 29.1 % — ABNORMAL LOW (ref 36.0–46.0)
Hemoglobin: 9.1 g/dL — ABNORMAL LOW (ref 12.0–15.0)
Immature Granulocytes: 1 %
Lymphocytes Relative: 7 %
Lymphs Abs: 1 10*3/uL (ref 0.7–4.0)
MCH: 31 pg (ref 26.0–34.0)
MCHC: 31.3 g/dL (ref 30.0–36.0)
MCV: 99 fL (ref 80.0–100.0)
Monocytes Absolute: 0.5 10*3/uL (ref 0.1–1.0)
Monocytes Relative: 4 %
Neutro Abs: 12.7 10*3/uL — ABNORMAL HIGH (ref 1.7–7.7)
Neutrophils Relative %: 88 %
Platelets: 293 10*3/uL (ref 150–400)
RBC: 2.94 MIL/uL — ABNORMAL LOW (ref 3.87–5.11)
RDW: 14.4 % (ref 11.5–15.5)
WBC: 14.4 10*3/uL — ABNORMAL HIGH (ref 4.0–10.5)
nRBC: 0 % (ref 0.0–0.2)

## 2020-04-24 LAB — MAGNESIUM: Magnesium: 2 mg/dL (ref 1.7–2.4)

## 2020-04-24 MED ORDER — FUROSEMIDE 10 MG/ML IJ SOLN
INTRAMUSCULAR | Status: AC
  Filled 2020-04-24: qty 4

## 2020-04-24 MED ORDER — FUROSEMIDE 10 MG/ML IJ SOLN
60.0000 mg | Freq: Once | INTRAMUSCULAR | Status: AC
  Administered 2020-04-24: 60 mg via INTRAVENOUS
  Filled 2020-04-24: qty 6

## 2020-04-24 MED ORDER — ALBUTEROL SULFATE (2.5 MG/3ML) 0.083% IN NEBU
2.5000 mg | INHALATION_SOLUTION | RESPIRATORY_TRACT | Status: DC | PRN
  Administered 2020-04-25: 2.5 mg via RESPIRATORY_TRACT
  Filled 2020-04-24: qty 3

## 2020-04-24 MED ORDER — NITROGLYCERIN 0.4 MG SL SUBL: 0.4000 mg | SUBLINGUAL_TABLET | SUBLINGUAL | Status: DC | PRN

## 2020-04-24 MED ORDER — GUAIFENESIN-DM 100-10 MG/5ML PO SYRP
5.0000 mL | ORAL_SOLUTION | ORAL | Status: DC | PRN
  Administered 2020-04-24: 5 mL via ORAL
  Filled 2020-04-24: qty 10

## 2020-04-24 MED ORDER — FUROSEMIDE 10 MG/ML IJ SOLN
40.0000 mg | Freq: Once | INTRAMUSCULAR | Status: AC
  Administered 2020-04-24: 40 mg via INTRAVENOUS
  Filled 2020-04-24: qty 4

## 2020-04-24 MED ORDER — MORPHINE SULFATE (PF) 2 MG/ML IV SOLN: 1.0000 mg | INTRAVENOUS | Status: DC | PRN

## 2020-04-24 MED ORDER — POTASSIUM CHLORIDE 10 MEQ/100ML IV SOLN
10.0000 meq | INTRAVENOUS | Status: AC
  Administered 2020-04-24 (×6): 10 meq via INTRAVENOUS
  Filled 2020-04-24 (×6): qty 100

## 2020-04-24 MED ORDER — ALPRAZOLAM 1 MG PO TABS
1.0000 mg | ORAL_TABLET | Freq: Once | ORAL | Status: AC
  Administered 2020-04-24: 1 mg via ORAL
  Filled 2020-04-24: qty 1

## 2020-04-24 NOTE — Progress Notes (Signed)
OT Cancellation Note  Patient Details Name: [name] MRN: [id number] DOB: 1933/05/18   Cancelled Treatment:    Reason Eval/Treat Not Completed: Pain limiting ability to participate. Requesting pain medicine before attempting therapy. Will f/u as able with patient.  [name] 04/24/2020, 12:20 PM

## 2020-04-24 NOTE — Progress Notes (Signed)
Patient ID: [name], female   DOB: 11/13/1932, 86 y.o.   MRN: [id number]  PROGRESS NOTE    [name]  IRW:[id number] DOB: June 04, 1933 DOA: 05/01/2020 PCP: Binnie Rail, MD   Brief Narrative:  86 year old female with history of COPD, chronic hypoxic respiratory failure on 3 L/min oxygen via nasal cannula, chronic hyponatremia, hyperlipidemia, chronic systolic and diastolic heart failure, hypothyroidism, recent admission from 04/07/2020-04/17/2020 with multiple medical issues including alcohol withdrawal seizure, non-STEMI, COPD exacerbation, CAP and hyponatremia with subsequent discharge to SNF presented on 05/09/2020 with sore throat, lethargy, poor oral intake and chest tightness.  On presentation, she was hypotensive, requiring 5 L oxygen via nasal cannula with sodium of 124, creatinine 1.38, troponin flat at 20x2, WBC of 15.5.  COVID-19 and influenza test were negative and throat swab was negative for strep.  CT of the abdomen and pelvis showed moderate volume pneumoperitoneum possibly from perforated acute sigmoid/distal descending colon diverticulitis with possible ileus.  CT of the chest showed left lower lobe consolidation and opacification of left lower lobe bronchi, favor atelectasis along with bilateral small pleural effusions.  She was started on broad-spectrum antibiotics.  General surgery was consulted; recommended conservative management.  PCCM was also consulted.  Assessment & Plan:   Severe sepsis: Present on admission Acute diverticulitis of sigmoid/descending colon with perforation and pneumoperitoneum -General surgery following and recommending conservative management.  Diet advancement as per general surgery. -Continue cefepime and Flagyl.  Off vancomycin.  DC IV fluids -Palliative care following.  COPD/emphysema Chronic hypoxic respiratory failure on 3 L oxygen via nasal cannula at home Bilateral pleural effusion -Continue Dulera and nebs.  Solu-Medrol DC'd  on 04/22/2020.  Solu-Medrol restarted on 04/23/2020 because of wheezing. -Continue oxygen supplementation.  Currently still on 3-4L oxygen -PCCM has signed off. -Patient looks more short of breath this morning.  Will get stat chest x-ray and give one more dose of Lasix.  Acute on chronic systolic and diastolic heart failure Recent non-STEMI -EF of 45 to 45% in the last echo on 04/07/2020 -Strict input output.  Daily weights. Positive fluid balance of 4947 cc since admission.  She received a dose of IV Lasix on 04/23/2020.  Will give another dose of IV Lasix today.  Chest pain -Nursing staff reported that patient apparently had chest pain this morning.  Subsequently she was given IV morphine.  will get EKG and troponins.  Use nitroglycerin as needed.  Hypertension -Monitor blood pressure.  Blood pressure medications on hold  Hypokalemia -Replace.  Repeat a.m. labs  Leukocytosis -WBC has again increased to 14.4 today.  Monitor  Acute on chronic hyponatremia -Baseline sodium of 132.  Sodium 135 today.  Monitor.  AKI -Improved.  Generalized deconditioning -Palliative care following.  PT recommends SNF placement.  Overall prognosis is very poor.    DVT prophylaxis: Subcutaneous heparin Code Status: DNR Family Communication: Spoke to daughter on phone on 04/24/2020 Disposition Plan: Status is: Inpatient  Remains inpatient appropriate because:Inpatient level of care appropriate due to severity of illness   Dispo: The patient is from: SNF              Anticipated d/c is to: SNF              Anticipated d/c date is: > 3 days              Patient currently is not medically stable to d/c.  Consultants: General surgery/PCCM/palliative care  Procedures: None  Antimicrobials:  Anti-infectives (From admission, onward)  Start     Dose/Rate Route Frequency Ordered Stop   04/22/20 1000  vancomycin (VANCOCIN) IVPB 1000 mg/200 mL premix  Status:  Discontinued        1,000 mg 200  mL/hr over 60 Minutes Intravenous Every 36 hours 04/30/2020 1453 04/21/20 1246   04/21/20 2200  ceFEPIme (MAXIPIME) 2 g in sodium chloride 0.9 % 100 mL IVPB        2 g 200 mL/hr over 30 Minutes Intravenous Every 12 hours 04/21/20 1246     04/21/20 1400  vancomycin (VANCOCIN) IVPB 1000 mg/200 mL premix  Status:  Discontinued        1,000 mg 200 mL/hr over 60 Minutes Intravenous Every 24 hours 04/21/20 1246 04/21/20 2230   04/21/20 1000  ceFEPIme (MAXIPIME) 2 g in sodium chloride 0.9 % 100 mL IVPB  Status:  Discontinued        2 g 200 mL/hr over 30 Minutes Intravenous Every 24 hours 05/10/2020 1453 04/21/20 1246   05/19/2020 2200  metroNIDAZOLE (FLAGYL) IVPB 500 mg        500 mg 100 mL/hr over 60 Minutes Intravenous Every 8 hours 04/30/2020 1340     05/09/2020 1345  ceFEPIme (MAXIPIME) 2 g in sodium chloride 0.9 % 100 mL IVPB  Status:  Discontinued        2 g 200 mL/hr over 30 Minutes Intravenous  Once 05/03/2020 1340 05/17/2020 1454   05/11/2020 1345  vancomycin (VANCOCIN) IVPB 1000 mg/200 mL premix  Status:  Discontinued        1,000 mg 200 mL/hr over 60 Minutes Intravenous  Once 05/04/2020 1340 05/07/2020 1454   04/26/2020 1115  ceFEPIme (MAXIPIME) 2 g in sodium chloride 0.9 % 100 mL IVPB        2 g 200 mL/hr over 30 Minutes Intravenous  Once 04/21/2020 1105 04/26/2020 1156   05/13/2020 1115  metroNIDAZOLE (FLAGYL) IVPB 500 mg        500 mg 100 mL/hr over 60 Minutes Intravenous  Once 04/24/2020 1105 05/11/2020 1442   05/17/2020 1115  vancomycin (VANCOCIN) IVPB 1000 mg/200 mL premix        1,000 mg 200 mL/hr over 60 Minutes Intravenous  Once 04/21/2020 1105 05/13/2020 1253      Subjective: Patient seen and examined at bedside.  She is a poor historian.  Looks more drowsy today, wakes up only very minimally, hardly answers any questions.  Nursing staff reported that patient was complaining of chest pain early this morning and was given IV morphine.  No overnight fevers reported.   Objective: Vitals:   04/23/20 2134  04/24/20 0500 04/24/20 0537 04/24/20 0736  BP: (!) 155/91  (!) 160/85   Pulse: 100  100   Resp: (!) 24  (!) 22   Temp: (!) 97.5 F (36.4 C)  (!) 97.5 F (36.4 C)   TempSrc: Oral  Oral   SpO2: 95%  96% 97%  Weight:  74.4 kg    Height:        Intake/Output Summary (Last 24 hours) at 04/24/2020 0753 Last data filed at 04/24/2020 0600 Gross per 24 hour  Intake 2356.32 ml  Output 1250 ml  Net 1106.32 ml   Filed Weights   04/22/20 0500 04/23/20 0500 04/24/20 0500  Weight: 72.7 kg 72.4 kg 74.4 kg    Examination:  General exam: Chronically ill looking elderly female lying in bed.  No acute distress.  Currently on 3 L oxygen via nasal cannula  respiratory system: Bilateral decreased  breath sounds at bases with intermittent tachypnea and scattered crackles  cardiovascular system: S1-S2 heard rate controlled Gastrointestinal system: Abdomen is distended, soft and tender in the lower quadrant.  Bowel sounds are sluggish  extremities: No cyanosis.  Trace lower extremity edema present Central nervous system: Looks more drowsy today, wakes up only very minimally, hardly answers any questions. No focal neurological deficits.  Moving extremities Skin: No obvious petechiae/rashes Psychiatry: Could not be assessed because of mental status   Data Reviewed: I have personally reviewed following labs and imaging studies  CBC: Recent Labs  Lab 05/16/2020 0748 04/21/20 0219 04/22/20 0405 04/23/20 0359 04/24/20 0329  WBC 15.5* 10.5 9.2 13.4* 14.4*  NEUTROABS 12.6*  --  8.3* 11.8* 12.7*  HGB 10.1* 8.8* 8.1* 8.4* 9.1*  HCT 31.2* 27.3* 24.9* 26.5* 29.1*  MCV 97.5 97.2 96.9 98.5 99.0  PLT 212 195 247 280 833   Basic Metabolic Panel: Recent Labs  Lab 04/24/2020 1921 05/02/2020 1921 04/21/20 0219 04/21/20 2253 04/22/20 0405 04/23/20 0359 04/24/20 0329  NA 128*  --  129*  --  131* 133* 135  K 3.4*   < > 3.3* 4.0 4.0 3.9 2.9*  CL 84*  --  89*  --  92* 95* 95*  CO2 32  --  30  --  32 31 30    GLUCOSE 101*  --  142*  --  159* 128* 106*  BUN 24*  --  23  --  18 15 15   CREATININE 1.14*  --  1.02*  --  0.92 0.81 0.80  CALCIUM 8.1*  --  7.4*  --  8.4* 8.5* 8.5*  MG  --   --  1.9 2.0 2.0 2.1 2.0  PHOS  --   --  3.7 2.3* 2.2*  --   --    < > = values in this interval not displayed.   GFR: Estimated Creatinine Clearance: 53 mL/min (by C-G formula based on SCr of 0.8 mg/dL). Liver Function Tests: Recent Labs  Lab 04/27/2020 0748 04/22/20 0405 04/23/20 0359 04/24/20 0329  AST 40 23 26 27   ALT 30 23 24 24   ALKPHOS 126 91 90 91  BILITOT 0.7 0.8 0.5 0.6  PROT 5.3* 5.3* 5.3* 5.3*  ALBUMIN 2.2* 2.6* 2.5* 2.6*   No results for input(s): LIPASE, AMYLASE in the last 168 hours. No results for input(s): AMMONIA in the last 168 hours. Coagulation Profile: Recent Labs  Lab 04/26/2020 0748  INR 1.0   Cardiac Enzymes: No results for input(s): CKTOTAL, CKMB, CKMBINDEX, TROPONINI in the last 168 hours. BNP (last 3 results) No results for input(s): PROBNP in the last 8760 hours. HbA1C: No results for input(s): HGBA1C in the last 72 hours. CBG: Recent Labs  Lab 04/21/20 2021 04/22/20 0744 04/22/20 1125 04/22/20 1616 04/22/20 2045  GLUCAP 155* 119* 131* 122* 150*   Lipid Profile: No results for input(s): CHOL, HDL, LDLCALC, TRIG, CHOLHDL, LDLDIRECT in the last 72 hours. Thyroid Function Tests: No results for input(s): TSH, T4TOTAL, FREET4, T3FREE, THYROIDAB in the last 72 hours. Anemia Panel: No results for input(s): VITAMINB12, FOLATE, FERRITIN, TIBC, IRON, RETICCTPCT in the last 72 hours. Sepsis Labs: Recent Labs  Lab 05/17/2020 1332 05/03/2020 1921 04/21/20 2238 04/22/20 0405 04/23/20 0359  PROCALCITON  --   --  0.23 0.27 0.19  LATICACIDVEN 2.0* 1.2 1.3 1.2  --     Recent Results (from the past 240 hour(s))  SARS Coronavirus 2 by RT PCR (hospital order, performed in [place]  hospital lab) Nasopharyngeal Nasopharyngeal Swab     Status: None   Collection Time:  04/16/20 11:00 PM   Specimen: Nasopharyngeal Swab  Result Value Ref Range Status   SARS Coronavirus 2 NEGATIVE NEGATIVE Final    Comment: (NOTE) SARS-CoV-2 target nucleic acids are NOT DETECTED.  The SARS-CoV-2 RNA is generally detectable in upper and lower respiratory specimens during the acute phase of infection. The lowest concentration of SARS-CoV-2 viral copies this assay can detect is 250 copies / mL. A negative result does not preclude SARS-CoV-2 infection and should not be used as the sole basis for treatment or other patient management decisions.  A negative result may occur with improper specimen collection / handling, submission of specimen other than nasopharyngeal swab, presence of viral mutation(s) within the areas targeted by this assay, and inadequate number of viral copies (<250 copies / mL). A negative result must be combined with clinical observations, patient history, and epidemiological information.  Fact Sheet for Patients:   StrictlyIdeas.no  Fact Sheet for Healthcare Providers: BankingDealers.co.za  This test is not yet approved or  cleared by the [place] FDA and has been authorized for detection and/or diagnosis of SARS-CoV-2 by FDA under an Emergency Use Authorization (EUA).  This EUA will remain in effect (meaning this test can be used) for the duration of the COVID-19 declaration under Section 564(b)(1) of the Act, 21 U.S.C. section 360bbb-3(b)(1), unless the authorization is terminated or revoked sooner.  Performed at [facility], [place] [address] [place], [place] [address]   Blood Culture (routine x 2)     Status: None (Preliminary result)   Collection Time: 05/05/2020  7:48 AM   Specimen: BLOOD  Result Value Ref Range Status   Specimen Description   Final    BLOOD RIGHT ANTECUBITAL Performed at [facility] [address] [place], [place] [address]     Special Requests   Final    BOTTLES DRAWN AEROBIC AND ANAEROBIC Blood Culture results may not be optimal due to an excessive volume of blood received in culture bottles Performed at [facility] [place] [address] [place], [place] [address]    Culture   Final    NO GROWTH 4 DAYS Performed at [facility], [place] [address], [place], [place] [address]    Report Status PENDING  Incomplete  Respiratory Panel by RT PCR (Flu A&B, Covid) - Nasopharyngeal Swab     Status: None   Collection Time: 05/03/2020  8:50 AM   Specimen: Nasopharyngeal Swab  Result Value Ref Range Status   SARS Coronavirus 2 by RT PCR NEGATIVE NEGATIVE Final    Comment: (NOTE) SARS-CoV-2 target nucleic acids are NOT DETECTED.  The SARS-CoV-2 RNA is generally detectable in upper respiratoy specimens during the acute phase of infection. The lowest concentration of SARS-CoV-2 viral copies this assay can detect is 131 copies/mL. A negative result does not preclude SARS-Cov-2 infection and should not be used as the sole basis for treatment or other patient management decisions. A negative result may occur with  improper specimen collection/handling, submission of specimen other than nasopharyngeal swab, presence of viral mutation(s) within the areas targeted by this assay, and inadequate number of viral copies (<131 copies/mL). A negative result must be combined with clinical observations, patient history, and epidemiological information. The expected result is Negative.  Fact Sheet for Patients:  PinkCheek.be  Fact Sheet for Healthcare Providers:  GravelBags.it  This test is no t yet approved or cleared  by the [place] and  has been authorized for detection and/or diagnosis of SARS-CoV-2 by FDA under an Emergency Use Authorization (EUA). This EUA will remain  in effect (meaning this test can be used) for the duration of the COVID-19  declaration under Section 564(b)(1) of the Act, 21 U.S.C. section 360bbb-3(b)(1), unless the authorization is terminated or revoked sooner.     Influenza A by PCR NEGATIVE NEGATIVE Final   Influenza B by PCR NEGATIVE NEGATIVE Final    Comment: (NOTE) The Xpert Xpress SARS-CoV-2/FLU/RSV assay is intended as an aid in  the diagnosis of influenza from Nasopharyngeal swab specimens and  should not be used as a sole basis for treatment. Nasal washings and  aspirates are unacceptable for Xpert Xpress SARS-CoV-2/FLU/RSV  testing.  Fact Sheet for Patients: PinkCheek.be  Fact Sheet for Healthcare Providers: GravelBags.it  This test is not yet approved or cleared by the [place] FDA and  has been authorized for detection and/or diagnosis of SARS-CoV-2 by  FDA under an Emergency Use Authorization (EUA). This EUA will remain  in effect (meaning this test can be used) for the duration of the  Covid-19 declaration under Section 564(b)(1) of the Act, 21  U.S.C. section 360bbb-3(b)(1), unless the authorization is  terminated or revoked. Performed at [facility], [place] [address] [place], [place] [address]   Group A Strep by PCR     Status: None   Collection Time: 05/06/2020  8:58 AM  Result Value Ref Range Status   Group A Strep by PCR NOT DETECTED NOT DETECTED Final    Comment: Performed at [facility], [place] [address] [place], [place] [address]  Blood Culture (routine x 2)     Status: None (Preliminary result)   Collection Time: 05/18/2020  9:17 AM   Specimen: BLOOD  Result Value Ref Range Status   Specimen Description   Final    BLOOD RIGHT ANTECUBITAL Performed at [facility] [address], [place], [place] [address]    Special Requests   Final    BOTTLES DRAWN AEROBIC AND ANAEROBIC Blood Culture results may not be optimal due to an excessive volume of blood received  in culture bottles Performed at [facility] [address], [place], [place] [address]    Culture   Final    NO GROWTH 4 DAYS Performed at [facility], [place] [address] [place], [place] [address]    Report Status PENDING  Incomplete  Urine culture     Status: None   Collection Time: 05/16/2020 12:37 PM   Specimen: In/Out Cath Urine  Result Value Ref Range Status   Specimen Description   Final    IN/OUT CATH URINE Performed at [facility] [address] [place], [place] [address]    Special Requests   Final    NONE Performed at [facility], [place] [address] [place], [place] [address]    Culture   Final    NO GROWTH Performed at [facility], [place] [address], [place], [place] [address]    Report Status 04/21/2020 FINAL  Final  MRSA PCR Screening     Status: None   Collection Time: 04/30/2020  5:45 PM   Specimen: Nasal Mucosa; Nasopharyngeal  Result Value Ref Range Status   MRSA by PCR NEGATIVE NEGATIVE Final    Comment:        The GeneXpert MRSA Assay (FDA approved for NASAL  specimens only), is one component of a comprehensive MRSA colonization surveillance program. It is not intended to diagnose MRSA infection nor to guide or monitor treatment for MRSA infections. Performed at [facility], [place] [address] [place], [place] [address]   Respiratory Panel by PCR     Status: None   Collection Time: 05/18/2020  9:20 PM  Result Value Ref Range Status   Adenovirus NOT DETECTED NOT DETECTED Final   Coronavirus 229E NOT DETECTED NOT DETECTED Final    Comment: (NOTE) The Coronavirus on the Respiratory Panel, DOES NOT test for the novel  Coronavirus (2019 nCoV)    Coronavirus HKU1 NOT DETECTED NOT DETECTED Final   Coronavirus NL63 NOT DETECTED NOT DETECTED Final   Coronavirus OC43 NOT DETECTED NOT DETECTED Final   Metapneumovirus NOT DETECTED NOT DETECTED Final   Rhinovirus / Enterovirus  NOT DETECTED NOT DETECTED Final   Influenza A NOT DETECTED NOT DETECTED Final   Influenza B NOT DETECTED NOT DETECTED Final   Parainfluenza Virus 1 NOT DETECTED NOT DETECTED Final   Parainfluenza Virus 2 NOT DETECTED NOT DETECTED Final   Parainfluenza Virus 3 NOT DETECTED NOT DETECTED Final   Parainfluenza Virus 4 NOT DETECTED NOT DETECTED Final   Respiratory Syncytial Virus NOT DETECTED NOT DETECTED Final   Bordetella pertussis NOT DETECTED NOT DETECTED Final   Chlamydophila pneumoniae NOT DETECTED NOT DETECTED Final   Mycoplasma pneumoniae NOT DETECTED NOT DETECTED Final    Comment: Performed at [facility], [place] [address], [place], [place] [address]         Radiology Studies: No results found.      Scheduled Meds: . aspirin  81 mg Oral Daily  . Chlorhexidine Gluconate Cloth  6 each Topical Daily  . heparin  5,000 Units Subcutaneous Q8H  . ipratropium-albuterol  3 mL Nebulization TID  . levothyroxine  100 mcg Oral Q0600  . methylPREDNISolone (SOLU-MEDROL) injection  40 mg Intravenous Daily  . mometasone-formoterol  1 puff Inhalation BID  . sodium chloride flush  3 mL Intravenous Q12H   Continuous Infusions: . ceFEPime (MAXIPIME) IV Stopped (04/23/20 2211)  . dextrose 5 % and 0.9% NaCl 75 mL/hr at 04/24/20 0600  . metronidazole 100 mL/hr at 04/24/20 0600          Aline August, MD [facility] 04/24/2020, 7:53 AM

## 2020-04-24 NOTE — Progress Notes (Signed)
Daily Progress Note   Patient Name: [name]       Date: 04/24/2020 DOB: [birth year]  Age: 86 y.o. MRN#: [id number] Attending Physician: Aline August, MD Primary Care Physician: Binnie Rail, MD Admit Date: 04/21/2020  Reason for Consultation/Follow-up: Establishing goals of care  Subjective: I saw and examined [name] today and spoke with her daughter at the bedside.  Patient herself appears to be less oriented today.  She is able to follow some conversation, but she does not fully understand what is going on which is a change from yesterday.  Her breathing also appears to be much more labored.  We discussed pain and she reports she was having chest pain but now it is more in her back.  Reviewed use of pain medication as needed to ensure that she is not uncomfortable.  She is again asking when she can go home and see her husband.  I talked with her daughter, [name], about concern that she may be clinically worsening.  We reviewed clinical course over the past 24 hours and changes [name] has also noticed with her increased labored respirations.  Discussed plan for continuation of current interventions including working to diurese and also treat with continued antibiotics.  [name] expressed she is concerned about her father being able to see [name].  She understands visitation policy, however, her father has medical problems and if he is the designated visitor he can only tolerate visitation of less than an hour due to his own medical problems.  This, unfortunately, then leaves [name] alone and scared for the rest of the day.  Length of Stay: 4  Current Medications: Scheduled Meds:  . aspirin  81 mg Oral Daily  . Chlorhexidine Gluconate Cloth  6 each Topical Daily  .  heparin  5,000 Units Subcutaneous Q8H  . ipratropium-albuterol  3 mL Nebulization TID  . levothyroxine  100 mcg Oral Q0600  . methylPREDNISolone (SOLU-MEDROL) injection  40 mg Intravenous Daily  . mometasone-formoterol  1 puff Inhalation BID  . sodium chloride flush  3 mL Intravenous Q12H    Continuous Infusions: . ceFEPime (MAXIPIME) IV 2 g (04/24/20 1006)  . metronidazole 500 mg (04/24/20 1326)    PRN Meds: acetaminophen **OR** acetaminophen, albuterol, diphenhydrAMINE, lip balm, morphine injection, nitroGLYCERIN, polyethylene glycol, sodium chloride flush  Physical Exam         General: Chronically ill-appearing female.  Tachypneic with increased work of breathing. HEENT: No bruits, no goiter, no JVD Heart: Tachycardic. No murmur appreciated. Lungs: Decreased air movement, crackles Abdomen: Soft, nontender, nondistended, positive bowel sounds.  Ext: No significant edema Skin: Warm and dry Neuro: Grossly intact, nonfocal.   Vital Signs: BP (!) 121/97 (BP Location: Left Arm)   Pulse (!) 102   Temp 97.9 F (36.6 C) (Oral)   Resp 18   Ht 5\' 6"  (1.676 m)   Wt 74.4 kg   SpO2 93%   BMI 26.47 kg/m  SpO2: SpO2: 93 % O2 Device: O2 Device: Nasal Cannula O2 Flow Rate: O2 Flow Rate (L/min): 3 L/min  Intake/output summary:   Intake/Output Summary (Last 24 hours) at 04/24/2020 1604 Last data filed at 04/24/2020 1220 Gross per 24 hour  Intake 1341.93 ml  Output 2200 ml  Net -858.07 ml   LBM: Last BM Date: 04/23/20 Baseline Weight: Weight: 66.2 kg Most recent weight: Weight: 74.4 kg       Palliative Assessment/Data:      Patient Active Problem List   Diagnosis Date Noted  . Diverticulitis of colon 04/21/2020  . Severe sepsis ([place]) 04/21/2020  . Diverticulitis of colon with perforation 04/21/2020  . COPD with emphysema ([place]) 04/21/2020  . Pleural effusion 04/21/2020  . Systolic and diastolic CHF, chronic ([place]) 04/21/2020  . Failure to thrive in adult 04/28/2020    . Chronic combined systolic (congestive) and diastolic (congestive) heart failure ([place]) 05/06/2020  . AKI (acute kidney injury) ([place]) 05/07/2020  . Acute combined systolic (congestive) and diastolic (congestive) heart failure ([place]) 04/15/2020  . Encounter for assessment of decision-making capacity 04/15/2020  . Hyponatremia 04/07/2020  . Alcohol abuse 04/07/2020  . Sialadenitis 03/26/2020  . Arm wound, left, initial encounter 01/15/2020  . Open knee wound, right, initial encounter 01/15/2020  . Diarrhea 01/01/2020  . Blood in stool 01/01/2020  . Left lower quadrant abdominal pain 01/01/2020  . Dark stools 11/05/2019  . GERD (gastroesophageal reflux disease) 11/05/2019  . Hypomagnesemia 10/14/2019  . Skin tear of left upper arm without complication 16/03/9603  . COPD with acute exacerbation ([place]) 05/22/2019  . Urinary incontinence due to immobility 05/22/2019  . Non-healing wound of lower extremity 05/03/2019  . Multiple skin tears 03/02/2019  . Weight loss 11/20/2018  . Depression 11/20/2018  . Recurrent falls 11/20/2018  . Parotiditis 09/17/2018  . Primary osteoarthritis of right hip 06/21/2018  . Degenerative disc disease, cervical 12/06/2017  . DOE (dyspnea on exertion) 11/21/2017  . Bilateral swelling of feet 11/02/2017  . Osteopenia 02/16/2017  . Hyperglycemia 12/08/2016  . Degenerative arthritis of right knee 10/27/2016  . 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 Greater trochanteric bursitis of both hips 04/08/2016  . Pain in both knees 04/08/2016  . Chronic midline low back pain without sciatica 04/08/2016  . Chronic respiratory failure with hypoxia ([place]) 03/07/2015  . Diverticulitis 05/24/2014  . Anxiety   . Bladder prolapse, female, acquired   . Non-small cell carcinoma of lung, stage 1 ([place]) 01/10/2012  . Neuropathy, peripheral 01/28/2011  . COPD mixed type ([place]) 09/08/2010  . Dyslipidemia   . ATONY OF BLADDER 08/10/2009  .  Hypothyroidism 03/12/2008  . Seasonal allergic rhinitis 10/10/2007    Palliative Care Assessment & Plan   Patient Profile: [name] is an 86 year old female with past medical history of COPD, chronic hypoxic respiratory failure on 3 L,  chronic hyponatremia, hyperlipidemia, chronic systolic and diastolic heart failure, hypothyroidism, recent admission from 10/19-10/29 for multiple issues including NSTEMI, COPD exacerbation, CAP, alcohol withdrawal, and hyponatremia who was discharged to skilled facility and represented on 11/1 with sore throat, lethargy poor intake and chest tightness.  She was found to be hypotensive with increased oxygen requirement and CT of the abdomen and pelvis showed moderate volume pneumoperitoneum from possibly perforated sigmoid/distal descending colon diverticulitis.  She has been on broad-spectrum antibiotics general surgery has been consulted.  Recommendation is for conservative management.  Palliative consulted for goals of care.  Recommendations/Plan:  DNR/DNI  Continue current interventions.  Overall, she appears to be clinically worse to me today.  Discussed concerns with daughter who expressed understanding that [name] appears to be worsening.  Patient's daughter, [name], reports that she wants to bring her dad to see her mother.  While the goal moving forward is not comfort at this time, I do think there is a real possibility [name] will continue to decline despite our best medical interventions.  I also spoke with Dr. Starla Link who shares this concern.  My recommendation is to allow her husband to come and see her for short visits each day in addition to her daughter being with her as her designated visitor as the patient's husband has medical problems himself and can only be present for short period of time.     Code Status:    Code Status Orders  (From admission, onward)         Start     Ordered   05/17/2020 1340  Do not attempt resuscitation (DNR)   Continuous       Question Answer Comment  In the event of cardiac or respiratory ARREST Do not call a "code blue"   In the event of cardiac or respiratory ARREST Do not perform Intubation, CPR, defibrillation or ACLS   In the event of cardiac or respiratory ARREST Use medication by any route, position, wound care, and other measures to relive pain and suffering. May use oxygen, suction and manual treatment of airway obstruction as needed for comfort.      05/07/2020 1340        Code Status History    Date Active Date Inactive Code Status Order ID Comments User Context   04/07/2020 0536 04/17/2020 2022 DNR [id number]  Rise Patience, MD ED   05/27/2014 2114 05/30/2014 1803 Full Code [id number]  Jani Gravel, MD Inpatient   05/24/2014 0205 05/25/2014 2127 Full Code [id number]  Toy Baker, MD Inpatient   04/06/2012 1551 04/07/2012 1228 Full Code [id number]  Clayton Bibles, [facility]   Advance Care Planning Activity    Advance Directive Documentation     Most Recent Value  Type of Advance Directive Out of facility DNR (pink MOST or yellow form)  Pre-existing out of facility DNR order (yellow form or pink MOST form) Yellow form placed in chart (order not valid for inpatient use)  "MOST" Form in Place? --       Prognosis: Guarded Discharge Planning:  To be determined  Care plan was discussed with patient, daughter, and Dr. Starla Link  Thank you for allowing the Palliative Medicine Team to assist in the care of this patient.   Time In: 1315 Time Out: 1400 Total Time 45 Prolonged Time Billed No      Greater than 50%  of this time was spent counseling and coordinating care related to the above assessment and plan.  [name], MD  Please contact Palliative Medicine Team phone at [phone] for questions and concerns.

## 2020-04-24 NOTE — Progress Notes (Signed)
Patient was noted dyspneic at rest and tachypneic, patient pulled oxygen and gown off stated she is warm and also C/O chest pain 10 out of 10 pain scale PRN morphine given; Dr. Starla Link notified, in the room assessing patient and orders written, will continue to assess patient.

## 2020-04-24 NOTE — Progress Notes (Signed)
Occupational Therapy Treatment Patient Details Name: [name] MRN: [id number] DOB: March 21, 1933 Today's Date: 04/24/2020    History of present illness 86 year old female with a past medical history of chronic hypoxic respiratory failure on 3 L/min, chronic hyponatremia COPD, hyperlipidemia, combined systolic and diastolic heart failure, hypothyroidism who was recently admitted from 10/19-10/29 with multiple medical issues including alcohol withdrawal seizure, NSTEMI (troponin up to 34,000), COPD exacerbation, secondary to CAP and hyponatremia who was discharged to SNF and presents today with complaints of sore throat and chest tightness. Dx of diverticulitis   OT comments  Treatment focused on activity to improve mobility and activity tolerance. Patient supine in bed and appears short of breath. Patient agreeable to therapy and wants to sit up in chair. Min assist to transfer to side of bed. Unable to come into squat for pivot to recliner. Patient anxious with tremulous upper extremities and exhibits more dyspnea. Patient required assistance for pericare and able to assist with movement in bed but unable to assist with cleaning.  Patient limited by fatigue and shortness of breath.   Follow Up Recommendations  SNF    Equipment Recommendations  None recommended by OT    Recommendations for Other Services      Precautions / Restrictions Precautions Precautions: Fall Precaution Comments: hx falls, HOME 02 3 lts, R LE weakness Restrictions Weight Bearing Restrictions: No       Mobility Bed Mobility Overal bed mobility: Needs Assistance Bed Mobility: Supine to Sit;Sit to Supine     Supine to sit: Min assist;HOB elevated Sit to supine: Mod assist;+2 for safety/equipment   General bed mobility comments: Min assist for hand hold to transfer into sitting at side of bed. needed increased time and bed rail. Mod assist for LEs to return to supine.  Transfers Overall transfer  level: Needs assistance Equipment used: None             General transfer comment: Attempted to perform squat pivot to recliner but unable to power patient up. Patient reports anxiety and exhibits increasing dyspnea. Transferr deferred and returned patient to bed.    Balance Overall balance assessment: Needs assistance Sitting-balance support: No upper extremity supported;Feet supported Sitting balance-Leahy Scale: Fair                                     ADL either performed or assessed with clinical judgement   ADL Overall ADL's : Needs assistance/impaired                           Toilet Transfer Details (indicate cue type and reason): Unable to perform toilet transfer due to weakness and shortness of breath. Toileting- Clothing Manipulation and Hygiene: Total assistance;Bed level Toileting - Clothing Manipulation Details (indicate cue type and reason): Pericare performed after incontinent BM episode.Required total assistance.             Vision Patient Visual Report: No change from baseline     Perception     Praxis      Cognition Arousal/Alertness: Awake/alert Behavior During Therapy: WFL for tasks assessed/performed Overall Cognitive Status: Within Functional Limits for tasks assessed                                          Exercises  Shoulder Instructions       General Comments      Pertinent Vitals/ Pain       Pain Assessment: No/denies pain  Home Living                                          Prior Functioning/Environment              Frequency  Min 2X/week        Progress Toward Goals  OT Goals(current goals can now be found in the care plan section)  Progress towards OT goals: Progressing toward goals  Acute Rehab OT Goals Patient Stated Goal: wants to go home to be with husband OT Goal Formulation: With patient/family Time For Goal Achievement:  05/05/20 Potential to Achieve Goals: [place] Discharge plan remains appropriate    Co-evaluation          OT goals addressed during session: Other (comment);ADL's and self-care (functional mobility, activity tolerance)      AM-PAC OT "6 Clicks" Daily Activity     Outcome Measure       Help from another person toileting, which includes using toliet, bedpan, or urinal?: Total Help from another person bathing (including washing, rinsing, drying)?: A Lot Help from another person to put on and taking off regular upper body clothing?: A Lot Help from another person to put on and taking off regular lower body clothing?: Total 6 Click Score: 6    End of Session Equipment Utilized During Treatment: Oxygen  OT Visit Diagnosis: Unsteadiness on feet (R26.81);Muscle weakness (generalized) (M62.81)   Activity Tolerance Patient limited by fatigue   Patient Left in bed;with call bell/phone within reach;with bed alarm set;with family/visitor present   Nurse Communication  (okay to see per RN, request for fan.)        Time: 2025-4270 OT Time Calculation (min): 15 min  Charges: OT General Charges $OT Visit: 1 Visit OT Treatments $Therapeutic Activity: 8-22 mins  Derl Barrow, OTR/L [place]  Office [phone] Pager: [place] 04/24/2020, 3:02 PM

## 2020-04-24 NOTE — Progress Notes (Signed)
Patient C/O right arm pain by the PICC line, IV team assessed PICC stated it's working well, suggested X-ray, Dr. Starla Link notified, no new order given, will continue to assess patient.

## 2020-04-24 NOTE — Progress Notes (Signed)
Patient wheezing, resp. therapist notified, breathing treatment given without much changes, Dr. Starla Link notified as well of wheezing and increase confusion, more lasix ordered and given. Will continue to assess patient.

## 2020-04-24 NOTE — Progress Notes (Addendum)
   04/24/20 1825  Assess: MEWS Score  Temp 97.6 F (36.4 C)  BP 121/90  Pulse Rate (!) 117  Resp (!) 31  SpO2 95 %  O2 Device Nasal Cannula  O2 Flow Rate (L/min) 3 L/min  Assess: MEWS Score  MEWS Temp 0  MEWS Systolic 0  MEWS Pulse 2  MEWS RR 2  MEWS LOC 0  MEWS Score 4  MEWS Score Color Red  Assess: if the MEWS score is Yellow or Red  Were vital signs taken at a resting state? Yes  Focused Assessment Change from prior assessment (see assessment flowsheet) (increased wheezing and SOB)  Early Detection of Sepsis Score *See Row Information* High  MEWS guidelines implemented *See Row Information* Yes  Treat  MEWS Interventions Administered prn meds/treatments;Escalated (See documentation below)  Pain Scale 0-10  Pain Score 8  Pain Type Acute pain  Pain Location Generalized  Pain Orientation Right;Left  Pain Descriptors / Indicators Other (Comment) (pt unable to describe)  Pain Frequency Intermittent  Pain Onset Gradual  Patients Stated Pain Goal 0  Pain Intervention(s) Repositioned;Emotional support  Multiple Pain Sites No  Complains of Coughing;Itching;Shortness of breath;Anxiety  Interventions Medication (see MAR)  Itching intervention See MAR  Patients response to intervention Unchanged  Take Vital Signs  Increase Vital Sign Frequency  Red: Q 1hr X 4 then Q 4hr X 4, if remains red, continue Q 4hrs  Escalate  MEWS: Escalate Red: discuss with charge nurse/RN and provider, consider discussing with RRT  Notify: Charge Nurse/RN  Name of Charge Nurse/RN Notified Marissa RN  Date Charge Nurse/RN Notified 04/24/20  Time Charge Nurse/RN Notified 1838  Notify: Provider  Provider Name/Title Aline August  Date Provider Notified 04/24/20  Time Provider Notified [id number]  Notification Type Page  Notification Reason Change in status  Response See new orders  Date of Provider Response 04/24/20  Time of Provider Response 1194   Patient increased wheezing and SOB, causing  increased VS.

## 2020-04-24 NOTE — Progress Notes (Signed)
Diverticulitis  Subjective: Appears in some distress, not answering questions  Objective: Vital signs in last 24 hours: Temp:  [97.4 F (36.3 C)-97.5 F (36.4 C)] 97.4 F (36.3 C) (11/05 0816) Pulse Rate:  [94-108] 108 (11/05 0816) Resp:  [18-24] 20 (11/05 0816) BP: (138-160)/(61-91) 140/90 (11/05 0816) SpO2:  [91 %-97 %] 97 % (11/05 0816) Weight:  [74.4 kg] 74.4 kg (11/05 0500) Last BM Date: 04/23/20  Intake/Output from previous day: 11/04 0701 - 11/05 0700 In: 2356.3 [P.O.:100; I.V.:1696.2; IV Piggyback:560.1] Out: 1250 [Urine:1250] Intake/Output this shift: No intake/output data recorded.  General appearance: distressed, breathing heavy GI: normal findings: No TTP LLQ  Lab Results:  Results for orders placed or performed during the hospital encounter of 04/24/2020 (from the past 24 hour(s))  Comprehensive metabolic panel     Status: Abnormal   Collection Time: 04/24/20  3:29 AM  Result Value Ref Range   Sodium 135 135 - 145 mmol/L   Potassium 2.9 (L) 3.5 - 5.1 mmol/L   Chloride 95 (L) 98 - 111 mmol/L   CO2 30 22 - 32 mmol/L   Glucose, Bld 106 (H) 70 - 99 mg/dL   BUN 15 8 - 23 mg/dL   Creatinine, Ser 0.80 0.44 - 1.00 mg/dL   Calcium 8.5 (L) 8.9 - 10.3 mg/dL   Total Protein 5.3 (L) 6.5 - 8.1 g/dL   Albumin 2.6 (L) 3.5 - 5.0 g/dL   AST 27 15 - 41 U/L   ALT 24 0 - 44 U/L   Alkaline Phosphatase 91 38 - 126 U/L   Total Bilirubin 0.6 0.3 - 1.2 mg/dL   GFR, Estimated >60 >60 mL/min   Anion gap 10 5 - 15  Magnesium     Status: None   Collection Time: 04/24/20  3:29 AM  Result Value Ref Range   Magnesium 2.0 1.7 - 2.4 mg/dL  CBC with Differential/Platelet     Status: Abnormal   Collection Time: 04/24/20  3:29 AM  Result Value Ref Range   WBC 14.4 (H) 4.0 - 10.5 K/uL   RBC 2.94 (L) 3.87 - 5.11 MIL/uL   Hemoglobin 9.1 (L) 12.0 - 15.0 g/dL   HCT 29.1 (L) 36 - 46 %   MCV 99.0 80.0 - 100.0 fL   MCH 31.0 26.0 - 34.0 pg   MCHC 31.3 30.0 - 36.0 g/dL   RDW 14.4 11.5 -  15.5 %   Platelets 293 150 - 400 K/uL   nRBC 0.0 0.0 - 0.2 %   Neutrophils Relative % 88 %   Neutro Abs 12.7 (H) 1.7 - 7.7 K/uL   Lymphocytes Relative 7 %   Lymphs Abs 1.0 0.7 - 4.0 K/uL   Monocytes Relative 4 %   Monocytes Absolute 0.5 0.1 - 1.0 K/uL   Eosinophils Relative 0 %   Eosinophils Absolute 0.0 0.0 - 0.5 K/uL   Basophils Relative 0 %   Basophils Absolute 0.0 0.0 - 0.1 K/uL   Immature Granulocytes 1 %   Abs Immature Granulocytes 0.13 (H) 0.00 - 0.07 K/uL     Studies/Results Radiology     MEDS, Scheduled . aspirin  81 mg Oral Daily  . Chlorhexidine Gluconate Cloth  6 each Topical Daily  . furosemide  60 mg Intravenous Once  . heparin  5,000 Units Subcutaneous Q8H  . ipratropium-albuterol  3 mL Nebulization TID  . levothyroxine  100 mcg Oral Q0600  . methylPREDNISolone (SOLU-MEDROL) injection  40 mg Intravenous Daily  . mometasone-formoterol  1 puff  Inhalation BID  . sodium chloride flush  3 mL Intravenous Q12H     Assessment: Diverticulitis with perforation, pain seems to be improving.  WBC up today   Plan: Cont IV abx.  Cont NPO for now.  [place] for sips of clears.  Pt is a poor surgical candidate  LOS: 4 days    Rosario Adie, [facility], [place]    04/24/2020 9:16 AM

## 2020-04-25 ENCOUNTER — Inpatient Hospital Stay (HOSPITAL_COMMUNITY): Payer: PPO

## 2020-04-25 ENCOUNTER — Encounter (HOSPITAL_COMMUNITY): Payer: Self-pay | Admitting: Internal Medicine

## 2020-04-25 DIAGNOSIS — R109 Unspecified abdominal pain: Secondary | ICD-10-CM | POA: Diagnosis not present

## 2020-04-25 DIAGNOSIS — Z7189 Other specified counseling: Secondary | ICD-10-CM | POA: Diagnosis not present

## 2020-04-25 DIAGNOSIS — R0600 Dyspnea, unspecified: Secondary | ICD-10-CM

## 2020-04-25 DIAGNOSIS — K5720 Diverticulitis of large intestine with perforation and abscess without bleeding: Secondary | ICD-10-CM | POA: Diagnosis not present

## 2020-04-25 DIAGNOSIS — J9621 Acute and chronic respiratory failure with hypoxia: Secondary | ICD-10-CM | POA: Diagnosis not present

## 2020-04-25 DIAGNOSIS — N179 Acute kidney failure, unspecified: Secondary | ICD-10-CM | POA: Diagnosis not present

## 2020-04-25 DIAGNOSIS — A419 Sepsis, unspecified organism: Secondary | ICD-10-CM | POA: Diagnosis not present

## 2020-04-25 DIAGNOSIS — J441 Chronic obstructive pulmonary disease with (acute) exacerbation: Secondary | ICD-10-CM | POA: Diagnosis not present

## 2020-04-25 LAB — CBC WITH DIFFERENTIAL/PLATELET
Abs Immature Granulocytes: 0.13 10*3/uL — ABNORMAL HIGH (ref 0.00–0.07)
Basophils Absolute: 0 10*3/uL (ref 0.0–0.1)
Basophils Relative: 0 %
Eosinophils Absolute: 0 10*3/uL (ref 0.0–0.5)
Eosinophils Relative: 0 %
HCT: 28.2 % — ABNORMAL LOW (ref 36.0–46.0)
Hemoglobin: 8.7 g/dL — ABNORMAL LOW (ref 12.0–15.0)
Immature Granulocytes: 1 %
Lymphocytes Relative: 6 %
Lymphs Abs: 1 10*3/uL (ref 0.7–4.0)
MCH: 31.2 pg (ref 26.0–34.0)
MCHC: 30.9 g/dL (ref 30.0–36.0)
MCV: 101.1 fL — ABNORMAL HIGH (ref 80.0–100.0)
Monocytes Absolute: 0.6 10*3/uL (ref 0.1–1.0)
Monocytes Relative: 4 %
Neutro Abs: 13.2 10*3/uL — ABNORMAL HIGH (ref 1.7–7.7)
Neutrophils Relative %: 89 %
Platelets: 248 10*3/uL (ref 150–400)
RBC: 2.79 MIL/uL — ABNORMAL LOW (ref 3.87–5.11)
RDW: 14.5 % (ref 11.5–15.5)
WBC: 14.9 10*3/uL — ABNORMAL HIGH (ref 4.0–10.5)
nRBC: 0 % (ref 0.0–0.2)

## 2020-04-25 LAB — COMPREHENSIVE METABOLIC PANEL
ALT: 26 U/L (ref 0–44)
AST: 37 U/L (ref 15–41)
Albumin: 2.7 g/dL — ABNORMAL LOW (ref 3.5–5.0)
Alkaline Phosphatase: 89 U/L (ref 38–126)
Anion gap: 12 (ref 5–15)
BUN: 17 mg/dL (ref 8–23)
CO2: 29 mmol/L (ref 22–32)
Calcium: 8.4 mg/dL — ABNORMAL LOW (ref 8.9–10.3)
Chloride: 92 mmol/L — ABNORMAL LOW (ref 98–111)
Creatinine, Ser: 0.87 mg/dL (ref 0.44–1.00)
GFR, Estimated: >60 mL/min (ref >60)
Glucose, Bld: 90 mg/dL (ref 70–99)
Potassium: 3.6 mmol/L (ref 3.5–5.1)
Sodium: 133 mmol/L — ABNORMAL LOW (ref 135–145)
Total Bilirubin: 0.9 mg/dL (ref 0.3–1.2)
Total Protein: 5.4 g/dL — ABNORMAL LOW (ref 6.5–8.1)

## 2020-04-25 LAB — MAGNESIUM: Magnesium: 1.9 mg/dL (ref 1.7–2.4)

## 2020-04-25 LAB — BLOOD GAS, ARTERIAL
Acid-Base Excess: 4.8 mmol/L — ABNORMAL HIGH (ref 0.0–2.0)
Bicarbonate: 31.9 mmol/L — ABNORMAL HIGH (ref 20.0–28.0)
Drawn by: 560031
FIO2: 60
O2 Content: 10 L/min
O2 Saturation: 97.9 %
Patient temperature: 97.6
pCO2 arterial: 66.2 mmHg (ref 32.0–48.0)
pH, Arterial: 7.301 — ABNORMAL LOW (ref 7.350–7.450)
pO2, Arterial: 104 mmHg (ref 83.0–108.0)

## 2020-04-25 LAB — CULTURE, BLOOD (ROUTINE X 2)
Culture: NO GROWTH
Culture: NO GROWTH

## 2020-04-25 LAB — GLUCOSE, CAPILLARY: Glucose-Capillary: 100 mg/dL — ABNORMAL HIGH (ref 70–99)

## 2020-04-25 MED ORDER — GLYCOPYRROLATE 0.2 MG/ML IJ SOLN
0.2000 mg | INTRAMUSCULAR | Status: DC | PRN
  Filled 2020-04-25: qty 1

## 2020-04-25 MED ORDER — HYDROMORPHONE BOLUS VIA INFUSION: 0.5000 mg | Freq: Once | INTRAVENOUS | Status: DC

## 2020-04-25 MED ORDER — LORAZEPAM 2 MG/ML IJ SOLN
0.5000 mg | Freq: Once | INTRAMUSCULAR | Status: AC
  Administered 2020-04-25: 0.5 mg via INTRAVENOUS
  Filled 2020-04-25: qty 1

## 2020-04-25 MED ORDER — LORAZEPAM 2 MG/ML IJ SOLN: 1.0000 mg | INTRAMUSCULAR | Status: DC | PRN

## 2020-04-25 MED ORDER — LORAZEPAM 1 MG PO TABS: 1.0000 mg | ORAL_TABLET | ORAL | Status: DC | PRN

## 2020-04-25 MED ORDER — SODIUM CHLORIDE 0.9 % IV SOLN
0.5000 mg/h | INTRAVENOUS | Status: DC
  Administered 2020-04-25: 0.5 mg/h via INTRAVENOUS
  Filled 2020-04-25: qty 5

## 2020-04-25 MED ORDER — HYDROMORPHONE HCL 1 MG/ML IJ SOLN
0.5000 mg | INTRAMUSCULAR | Status: AC
  Administered 2020-04-25: 0.5 mg via INTRAVENOUS

## 2020-04-25 MED ORDER — LORAZEPAM 2 MG/ML PO CONC
1.0000 mg | ORAL | Status: DC | PRN
  Filled 2020-04-25: qty 0.5

## 2020-04-25 MED ORDER — MORPHINE SULFATE (PF) 2 MG/ML IV SOLN: 1.0000 mg | INTRAVENOUS | Status: DC | PRN

## 2020-04-25 MED ORDER — HYDROMORPHONE BOLUS VIA INFUSION
0.5000 mg | INTRAVENOUS | Status: DC | PRN
  Administered 2020-04-25 (×2): 0.5 mg via INTRAVENOUS
  Filled 2020-04-25: qty 1

## 2020-04-25 MED ORDER — HALOPERIDOL LACTATE 5 MG/ML IJ SOLN
1.0000 mg | Freq: Four times a day (QID) | INTRAMUSCULAR | Status: DC | PRN
  Administered 2020-04-25: 1 mg via INTRAVENOUS
  Filled 2020-04-25: qty 1

## 2020-04-25 MED ORDER — GLYCOPYRROLATE 1 MG PO TABS
1.0000 mg | ORAL_TABLET | ORAL | Status: DC | PRN
  Filled 2020-04-25: qty 1

## 2020-04-26 DIAGNOSIS — R0600 Dyspnea, unspecified: Secondary | ICD-10-CM

## 2020-05-05 ENCOUNTER — Ambulatory Visit: Payer: PPO | Admitting: Internal Medicine

## 2020-05-20 NOTE — Consult Note (Signed)
Responded to page, met with 3 family members after pt passed. Offered spiritual/emotional [place] support and prayer. Family is [place], and especially appreciated prayer. They asked Q's about pt placement process; I affirmed their information from nurse, and reiterated they had no time pressure to decide on a funeral home. They wanted to ensure that pt's mouth would be closed before she left room. I assured them the nurse would do that in preparing pt to leave the room.   Rev. Eloise Levels Chaplain

## 2020-05-20 NOTE — Progress Notes (Signed)
Daily Progress Note   Patient Name: [name]       Date: 04/26/2020 DOB: 01-15-1933  Age: 86 y.o. MRN#: [id number] Attending Physician: No att. providers found Primary Care Physician: Binnie Rail, MD Admit Date: 05/17/2020  Reason for Consultation/Follow-up: Establishing goals of care  Subjective: [name] has continued to decline.  At this point, plan is for full comfort moving forward.  Family currently at the bedside including her daughter and husband.  She is restless and agitated on arrival to the room.  Staff is working to initiate continuous infusion of Dilaudid and also working to transition from facemask to nasal cannula as she has been agitated by nonrebreather.  I was present in the room and recommended administration of bolus infusion of Dilaudid.  She remained agitated after approximately 10 minutes with this bolus and we gave a second bolus following which she became much more comfortable and less restless.  Length of Stay: 5   Physical Exam         General: Chronically ill-appearing female.  Tachypneic with increased work of breathing.  Restless and agitated HEENT: No bruits, no goiter, no JVD Heart: Tachycardic. No murmur appreciated. Lungs: Decreased air movement, crackles Abdomen: Soft, nontender, nondistended, positive bowel sounds.  Ext: No significant edema Skin: Warm and dry  Vital Signs: BP (!) 93/56 (BP Location: Left Leg)   Pulse 100   Temp 97.6 F (36.4 C) (Axillary)   Resp 16   Ht 5\' 6"  (1.676 m)   Wt 74.7 kg   SpO2 98%   BMI 26.58 kg/m  SpO2: SpO2: 98 % O2 Device: O2 Device: Partial Rebreather Mask O2 Flow Rate: O2 Flow Rate (L/min): 10 L/min  Intake/output summary:   Intake/Output Summary (Last 24 hours) at 04/26/2020 0916 Last  data filed at 05-04-2020 1225 Gross per 24 hour  Intake 59.47 ml  Output --  Net 59.47 ml   LBM: Last BM Date: 04/23/20 Baseline Weight: Weight: 66.2 kg Most recent weight: Weight: 74.7 kg     Patient Active Problem List   Diagnosis Date Noted  . Diverticulitis of colon 04/21/2020  . Severe sepsis ([place]) 04/21/2020  . Diverticulitis of colon with perforation 04/21/2020  . COPD with emphysema ([place]) 04/21/2020  . Pleural effusion 04/21/2020  . Systolic and diastolic CHF, chronic ([place])  04/21/2020  . Failure to thrive in adult 05/15/2020  . Chronic combined systolic (congestive) and diastolic (congestive) heart failure ([place]) 04/29/2020  . AKI (acute kidney injury) ([place]) 05/03/2020  . Acute combined systolic (congestive) and diastolic (congestive) heart failure ([place]) 04/15/2020  . Encounter for assessment of decision-making capacity 04/15/2020  . Hyponatremia 04/07/2020  . Alcohol abuse 04/07/2020  . Sialadenitis 03/26/2020  . Arm wound, left, initial encounter 01/15/2020  . Open knee wound, right, initial encounter 01/15/2020  . Diarrhea 01/01/2020  . Blood in stool 01/01/2020  . Left lower quadrant abdominal pain 01/01/2020  . Dark stools 11/05/2019  . GERD (gastroesophageal reflux disease) 11/05/2019  . Hypomagnesemia 10/14/2019  . Skin tear of left upper arm without complication 84/13/2440  . COPD with acute exacerbation ([place]) 05/22/2019  . Urinary incontinence due to immobility 05/22/2019  . Non-healing wound of lower extremity 05/03/2019  . Multiple skin tears 03/02/2019  . Weight loss 11/20/2018  . Depression 11/20/2018  . Recurrent falls 11/20/2018  . Parotiditis 09/17/2018  . Primary osteoarthritis of right hip 06/21/2018  . Degenerative disc disease, cervical 12/06/2017  . DOE (dyspnea on exertion) 11/21/2017  . Bilateral swelling of feet 11/02/2017  . Osteopenia 02/16/2017  . Hyperglycemia 12/08/2016  . Degenerative arthritis of right knee 10/27/2016  .  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 Greater trochanteric bursitis of both hips 04/08/2016  . Pain in both knees 04/08/2016  . Chronic midline low back pain without sciatica 04/08/2016  . Chronic respiratory failure with hypoxia ([place]) 03/07/2015  . Diverticulitis 05/24/2014  . Anxiety   . Bladder prolapse, female, acquired   . Non-small cell carcinoma of lung, stage 1 ([place]) 01/10/2012  . Neuropathy, peripheral 01/28/2011  . COPD mixed type ([place]) 09/08/2010  . Dyslipidemia   . ATONY OF BLADDER 08/10/2009  . Hypothyroidism 03/12/2008  . Seasonal allergic rhinitis 10/10/2007    Palliative Care Assessment & Plan   Patient Profile: [name] is an 86 year old female with past medical history of COPD, chronic hypoxic respiratory failure on 3 L, chronic hyponatremia, hyperlipidemia, chronic systolic and diastolic heart failure, hypothyroidism, recent admission from 10/19-10/29 for multiple issues including NSTEMI, COPD exacerbation, CAP, alcohol withdrawal, and hyponatremia who was discharged to skilled facility and represented on 11/1 with sore throat, lethargy poor intake and chest tightness.  She was found to be hypotensive with increased oxygen requirement and CT of the abdomen and pelvis showed moderate volume pneumoperitoneum from possibly perforated sigmoid/distal descending colon diverticulitis.  She has been on broad-spectrum antibiotics general surgery has been consulted.  Recommendation is for conservative management.  Palliative consulted for goals of care.  Recommendations/Plan:  DNR/DNI  Ms. Arel is actively dying.  Plan moving forward is for full comfort.  Orders have been placed for comfort care order set.  She was restless at the time that I entered the room and I stayed at the bedside well medications were given to get her more comfortable.  Discussed with family at bedside.  I expect her prognosis at this point is hours at best.  Code  Status:    Code Status Orders  (From admission, onward)         Start     Ordered   04/29/2020 1340  Do not attempt resuscitation (DNR)  Continuous       Question Answer Comment  In the event of cardiac or respiratory ARREST Do not call a "code blue"   In the event of cardiac or  respiratory ARREST Do not perform Intubation, CPR, defibrillation or ACLS   In the event of cardiac or respiratory ARREST Use medication by any route, position, wound care, and other measures to relive pain and suffering. May use oxygen, suction and manual treatment of airway obstruction as needed for comfort.      05/18/2020 1340        Code Status History    Date Active Date Inactive Code Status Order ID Comments User Context   04/07/2020 0536 04/17/2020 2022 DNR [id number]  Rise Patience, MD ED   05/27/2014 2114 05/30/2014 1803 Full Code [id number]  Jani Gravel, MD Inpatient   05/24/2014 0205 05/25/2014 2127 Full Code [id number]  Toy Baker, MD Inpatient   04/06/2012 1551 04/07/2012 1228 Full Code [id number]  Clayton Bibles, [facility]   Advance Care Planning Activity    Advance Directive Documentation     Most Recent Value  Type of Advance Directive Out of facility DNR (pink MOST or yellow form)  Pre-existing out of facility DNR order (yellow form or pink MOST form) Yellow form placed in chart (order not valid for inpatient use)  "MOST" Form in Place? --       Prognosis: She is actively dying.  Likely hours at best.  Discharge Planning:  This will be a terminal admission.  Care plan was discussed with patient, daughter, and Dr. Starla Link  Thank you for allowing the Palliative Medicine Team to assist in the care of this patient.   Time In: 1115 Time Out: 1150 Total Time 35 Prolonged Time Billed No   Greater than 50%  of this time was spent counseling and coordinating care related to the above assessment and plan.  [name], MD  Please contact Palliative Medicine Team phone at [phone] for  questions and concerns.

## 2020-05-20 NOTE — Progress Notes (Signed)
Pt woken up from sleep complaining of SOB. Pt O2 sats dropped to 50s. Nonrebreather placed on pt and RRT called. MD notified and orders received. Will continue to monitor pt.

## 2020-05-20 NOTE — Progress Notes (Signed)
Patient ID: [name], female   DOB: 1933-01-09, 86 y.o.   MRN: [id number]  PROGRESS NOTE    [name]  EXB:[id number] DOB: [birth year] DOA: 05/14/2020 PCP: Binnie Rail, MD   Brief Narrative:  86 year old female with history of COPD, chronic hypoxic respiratory failure on 3 L/min oxygen via nasal cannula, chronic hyponatremia, hyperlipidemia, chronic systolic and diastolic heart failure, hypothyroidism, recent admission from 04/07/2020-04/17/2020 with multiple medical issues including alcohol withdrawal seizure, non-STEMI, COPD exacerbation, CAP and hyponatremia with subsequent discharge to SNF presented on 05/12/2020 with sore throat, lethargy, poor oral intake and chest tightness.  On presentation, she was hypotensive, requiring 5 L oxygen via nasal cannula with sodium of 124, creatinine 1.38, troponin flat at 20x2, WBC of 15.5.  COVID-19 and influenza test were negative and throat swab was negative for strep.  CT of the abdomen and pelvis showed moderate volume pneumoperitoneum possibly from perforated acute sigmoid/distal descending colon diverticulitis with possible ileus.  CT of the chest showed left lower lobe consolidation and opacification of left lower lobe bronchi, favor atelectasis along with bilateral small pleural effusions.  She was started on broad-spectrum antibiotics.  General surgery was consulted; recommended conservative management.  PCCM was also consulted.  Palliative care was consulted for goals of care discussion.  Assessment & Plan:   Severe sepsis: Present on admission Acute diverticulitis of sigmoid/descending colon with perforation and pneumoperitoneum -General surgery following and recommending conservative management.  Diet advancement as per general surgery. -Continue cefepime and Flagyl.  Off vancomycin.  IV fluids discontinued on 04/24/2020. -Had a detailed discussion with daughter [name] at bedside today and explained very poor prognosis of her mother  and also significant deterioration over the last 24 hours.  Recommended total comfort measures.  Daughter is okay with comfort measures but wants to avoid morphine.  Will start Dilaudid drip.  Discontinue all antibiotics and other medications.  No more labs or other testing.  COPD/emphysema Acute on chronic hypoxic respiratory failure on 3 L oxygen via nasal cannula at home Bilateral pleural effusion -Continue Dulera and nebs.  Solu-Medrol DC'd on 04/22/2020.  Solu-Medrol restarted on 04/23/2020 because of wheezing. -Continue oxygen supplementation.   -PCCM has signed off. -Patient's respiratory status has worsened over the last 24 hours and is currently on nonrebreather oxygen.  Acute on chronic systolic and diastolic heart failure Recent non-STEMI -EF of 45 to 45% in the last echo on 04/07/2020 -Strict input output.  Daily weights. Positive fluid balance of 2597 cc since admission.  She has received IV Lasix on 04/23/2020 and 04/24/2020.  Hypertension -Blood pressure medications on hold.  Blood pressure on the lower side.  Hypokalemia -Improved.  Leukocytosis -WBC has again increased to 14.9 today.   Acute on chronic hyponatremia -Baseline sodium of 132.  Sodium 133 today.   AKI -Improved.  Generalized deconditioning -Palliative care following.  PT recommends SNF placement.  Overall prognosis is very poor. -Plan as above.   DVT prophylaxis: DC heparin for comfort measures. Code Status: DNR Family Communication: Spoke to daughter at bedside on May 11, 2020 Disposition Plan: Status is: Inpatient  Remains inpatient appropriate because:Inpatient level of care appropriate due to severity of illness   Dispo: The patient is from: SNF              Anticipated d/c is to: Expect hospital death.              Anticipated d/c date is: > 3 days  Patient currently is not medically stable to d/c.  Consultants: General surgery/PCCM/palliative care  Procedures:  None  Antimicrobials:  Anti-infectives (From admission, onward)   Start     Dose/Rate Route Frequency Ordered Stop   04/22/20 1000  vancomycin (VANCOCIN) IVPB 1000 mg/200 mL premix  Status:  Discontinued        1,000 mg 200 mL/hr over 60 Minutes Intravenous Every 36 hours 04/21/2020 1453 04/21/20 1246   04/21/20 2200  ceFEPIme (MAXIPIME) 2 g in sodium chloride 0.9 % 100 mL IVPB        2 g 200 mL/hr over 30 Minutes Intravenous Every 12 hours 04/21/20 1246     04/21/20 1400  vancomycin (VANCOCIN) IVPB 1000 mg/200 mL premix  Status:  Discontinued        1,000 mg 200 mL/hr over 60 Minutes Intravenous Every 24 hours 04/21/20 1246 04/21/20 2230   04/21/20 1000  ceFEPIme (MAXIPIME) 2 g in sodium chloride 0.9 % 100 mL IVPB  Status:  Discontinued        2 g 200 mL/hr over 30 Minutes Intravenous Every 24 hours 05/05/2020 1453 04/21/20 1246   04/27/2020 2200  metroNIDAZOLE (FLAGYL) IVPB 500 mg        500 mg 100 mL/hr over 60 Minutes Intravenous Every 8 hours 05/04/2020 1340     05/06/2020 1345  ceFEPIme (MAXIPIME) 2 g in sodium chloride 0.9 % 100 mL IVPB  Status:  Discontinued        2 g 200 mL/hr over 30 Minutes Intravenous  Once 05/08/2020 1340 05/05/2020 1454   05/11/2020 1345  vancomycin (VANCOCIN) IVPB 1000 mg/200 mL premix  Status:  Discontinued        1,000 mg 200 mL/hr over 60 Minutes Intravenous  Once 05/01/2020 1340 05/12/2020 1454   05/17/2020 1115  ceFEPIme (MAXIPIME) 2 g in sodium chloride 0.9 % 100 mL IVPB        2 g 200 mL/hr over 30 Minutes Intravenous  Once 04/30/2020 1105 05/16/2020 1156   05/10/2020 1115  metroNIDAZOLE (FLAGYL) IVPB 500 mg        500 mg 100 mL/hr over 60 Minutes Intravenous  Once 05/14/2020 1105 05/07/2020 1442   04/30/2020 1115  vancomycin (VANCOCIN) IVPB 1000 mg/200 mL premix        1,000 mg 200 mL/hr over 60 Minutes Intravenous  Once 05/11/2020 1105 05/17/2020 1253      Subjective: Patient seen and examined at bedside.  Poor historian, currently on a nonrebreather and hardly wakes up.   As per nursing staff, respiratory status has worsened overnight.  No fever or vomiting reported.   Objective: Vitals:   05-05-20 0325 2020-05-05 0430 2020/05/05 0449 May 05, 2020 0726  BP:  (!) 107/51    Pulse:  96    Resp:  20    Temp:      TempSrc:      SpO2: 97% 97%  96%  Weight:   74.7 kg   Height:        Intake/Output Summary (Last 24 hours) at May 05, 2020 0754 Last data filed at 05-05-2020 0334 Gross per 24 hour  Intake 600 ml  Output 2950 ml  Net -2350 ml   Filed Weights   04/23/20 0500 04/24/20 0500 05/05/20 0449  Weight: 72.4 kg 74.4 kg 74.7 kg    Examination:  General exam: Chronically ill looking elderly female lying in bed.  Poor historian, currently on a nonrebreather and hardly wakes up.  Respiratory system: Bilateral decreased breath sounds at bases with  some crackles, intermittently tachypneic.  No wheezing  cardiovascular system: Rate controlled, S1-S2 heard  gastrointestinal system: Abdomen is slightly distended, soft and tender in the lower quadrant.  Sluggish bowel sounds extremities: Mild lower extremity edema present, no clubbing Central nervous system: Very drowsy.  No focal neurological deficits.  Moves extremities.  Poor historian Skin: No obvious ecchymosis/lesions  psychiatry: Cannot assess because of mental status   Data Reviewed: I have personally reviewed following labs and imaging studies  CBC: Recent Labs  Lab 04/28/2020 0748 04/28/2020 0748 04/21/20 0219 04/22/20 0405 04/23/20 0359 04/24/20 0329 May 01, 2020 0402  WBC 15.5*   < > 10.5 9.2 13.4* 14.4* 14.9*  NEUTROABS 12.6*  --   --  8.3* 11.8* 12.7* 13.2*  HGB 10.1*   < > 8.8* 8.1* 8.4* 9.1* 8.7*  HCT 31.2*   < > 27.3* 24.9* 26.5* 29.1* 28.2*  MCV 97.5   < > 97.2 96.9 98.5 99.0 101.1*  PLT 212   < > 195 247 280 293 248   < > = values in this interval not displayed.   Basic Metabolic Panel: Recent Labs  Lab 04/21/20 0219 04/21/20 0219 04/21/20 2253 04/22/20 0405 04/23/20 0359  04/24/20 0329 2020/05/01 0402  NA 129*  --   --  131* 133* 135 133*  K 3.3*   < > 4.0 4.0 3.9 2.9* 3.6  CL 89*  --   --  92* 95* 95* 92*  CO2 30  --   --  32 31 30 29   GLUCOSE 142*  --   --  159* 128* 106* 90  BUN 23  --   --  18 15 15 17   CREATININE 1.02*  --   --  0.92 0.81 0.80 0.87  CALCIUM 7.4*  --   --  8.4* 8.5* 8.5* 8.4*  MG 1.9   < > 2.0 2.0 2.1 2.0 1.9  PHOS 3.7  --  2.3* 2.2*  --   --   --    < > = values in this interval not displayed.   GFR: Estimated Creatinine Clearance: 48.9 mL/min (by C-G formula based on SCr of 0.87 mg/dL). Liver Function Tests: Recent Labs  Lab 05/09/2020 0748 04/22/20 0405 04/23/20 0359 04/24/20 0329 2020/05/01 0402  AST 40 23 26 27  37  ALT 30 23 24 24 26   ALKPHOS 126 91 90 91 89  BILITOT 0.7 0.8 0.5 0.6 0.9  PROT 5.3* 5.3* 5.3* 5.3* 5.4*  ALBUMIN 2.2* 2.6* 2.5* 2.6* 2.7*   No results for input(s): LIPASE, AMYLASE in the last 168 hours. No results for input(s): AMMONIA in the last 168 hours. Coagulation Profile: Recent Labs  Lab 05/11/2020 0748  INR 1.0   Cardiac Enzymes: No results for input(s): CKTOTAL, CKMB, CKMBINDEX, TROPONINI in the last 168 hours. BNP (last 3 results) No results for input(s): PROBNP in the last 8760 hours. HbA1C: No results for input(s): HGBA1C in the last 72 hours. CBG: Recent Labs  Lab 04/22/20 0744 04/22/20 1125 04/22/20 1616 04/22/20 2045 05/01/2020 0257  GLUCAP 119* 131* 122* 150* 100*   Lipid Profile: No results for input(s): CHOL, HDL, LDLCALC, TRIG, CHOLHDL, LDLDIRECT in the last 72 hours. Thyroid Function Tests: No results for input(s): TSH, T4TOTAL, FREET4, T3FREE, THYROIDAB in the last 72 hours. Anemia Panel: No results for input(s): VITAMINB12, FOLATE, FERRITIN, TIBC, IRON, RETICCTPCT in the last 72 hours. Sepsis Labs: Recent Labs  Lab 05/01/2020 1332 04/27/2020 1921 04/21/20 2238 04/22/20 0405 04/23/20 0359  PROCALCITON  --   --  0.23 0.27 0.19  LATICACIDVEN 2.0* 1.2 1.3 1.2  --      Recent Results (from the past 240 hour(s))  SARS Coronavirus 2 by RT PCR (hospital order, performed in [facility] hospital lab) Nasopharyngeal Nasopharyngeal Swab     Status: None   Collection Time: 04/16/20 11:00 PM   Specimen: Nasopharyngeal Swab  Result Value Ref Range Status   SARS Coronavirus 2 NEGATIVE NEGATIVE Final    Comment: (NOTE) SARS-CoV-2 target nucleic acids are NOT DETECTED.  The SARS-CoV-2 RNA is generally detectable in upper and lower respiratory specimens during the acute phase of infection. The lowest concentration of SARS-CoV-2 viral copies this assay can detect is 250 copies / mL. A negative result does not preclude SARS-CoV-2 infection and should not be used as the sole basis for treatment or other patient management decisions.  A negative result may occur with improper specimen collection / handling, submission of specimen other than nasopharyngeal swab, presence of viral mutation(s) within the areas targeted by this assay, and inadequate number of viral copies (<250 copies / mL). A negative result must be combined with clinical observations, patient history, and epidemiological information.  Fact Sheet for Patients:   StrictlyIdeas.no  Fact Sheet for Healthcare Providers: BankingDealers.co.za  This test is not yet approved or  cleared by the [place] FDA and has been authorized for detection and/or diagnosis of SARS-CoV-2 by FDA under an Emergency Use Authorization (EUA).  This EUA will remain in effect (meaning this test can be used) for the duration of the COVID-19 declaration under Section 564(b)(1) of the Act, 21 U.S.C. section 360bbb-3(b)(1), unless the authorization is terminated or revoked sooner.  Performed at [facility], [place] [address] [place], [place] [address]   Blood Culture (routine x 2)     Status: None   Collection Time: 05/05/2020  7:48 AM   Specimen: BLOOD   Result Value Ref Range Status   Specimen Description   Final    BLOOD RIGHT ANTECUBITAL Performed at [facility] [address], [place], [place] [address]    Special Requests   Final    BOTTLES DRAWN AEROBIC AND ANAEROBIC Blood Culture results may not be optimal due to an excessive volume of blood received in culture bottles Performed at [facility] [address], [place], [place] [address]    Culture   Final    NO GROWTH 5 DAYS Performed at [facility], [place] [address], [place], [place] [address]    Report Status 2020-05-24 FINAL  Final  Respiratory Panel by RT PCR (Flu A&B, Covid) - Nasopharyngeal Swab     Status: None   Collection Time: 05/06/2020  8:50 AM   Specimen: Nasopharyngeal Swab  Result Value Ref Range Status   SARS Coronavirus 2 by RT PCR NEGATIVE NEGATIVE Final    Comment: (NOTE) SARS-CoV-2 target nucleic acids are NOT DETECTED.  The SARS-CoV-2 RNA is generally detectable in upper respiratoy specimens during the acute phase of infection. The lowest concentration of SARS-CoV-2 viral copies this assay can detect is 131 copies/mL. A negative result does not preclude SARS-Cov-2 infection and should not be used as the sole basis for treatment or other patient management decisions. A negative result may occur with  improper specimen collection/handling, submission of specimen other than nasopharyngeal swab, presence of viral mutation(s) within the areas targeted by this assay, and inadequate number of viral copies (<131 copies/mL). A negative result must be combined with clinical observations, patient  history, and epidemiological information. The expected result is Negative.  Fact Sheet for Patients:  PinkCheek.be  Fact Sheet for Healthcare Providers:  GravelBags.it  This test is no t yet approved or cleared by the [place] FDA and  has been authorized for  detection and/or diagnosis of SARS-CoV-2 by FDA under an Emergency Use Authorization (EUA). This EUA will remain  in effect (meaning this test can be used) for the duration of the COVID-19 declaration under Section 564(b)(1) of the Act, 21 U.S.C. section 360bbb-3(b)(1), unless the authorization is terminated or revoked sooner.     Influenza A by PCR NEGATIVE NEGATIVE Final   Influenza B by PCR NEGATIVE NEGATIVE Final    Comment: (NOTE) The Xpert Xpress SARS-CoV-2/FLU/RSV assay is intended as an aid in  the diagnosis of influenza from Nasopharyngeal swab specimens and  should not be used as a sole basis for treatment. Nasal washings and  aspirates are unacceptable for Xpert Xpress SARS-CoV-2/FLU/RSV  testing.  Fact Sheet for Patients: PinkCheek.be  Fact Sheet for Healthcare Providers: GravelBags.it  This test is not yet approved or cleared by the [place] FDA and  has been authorized for detection and/or diagnosis of SARS-CoV-2 by  FDA under an Emergency Use Authorization (EUA). This EUA will remain  in effect (meaning this test can be used) for the duration of the  Covid-19 declaration under Section 564(b)(1) of the Act, 21  U.S.C. section 360bbb-3(b)(1), unless the authorization is  terminated or revoked. Performed at [facility], [place] [address], [place], [place] [address]   Group A Strep by PCR     Status: None   Collection Time: 04/22/2020  8:58 AM  Result Value Ref Range Status   Group A Strep by PCR NOT DETECTED NOT DETECTED Final    Comment: Performed at [facility], [place] [address], [place], [place] [address]  Blood Culture (routine x 2)     Status: None   Collection Time: 05/04/2020  9:17 AM   Specimen: BLOOD  Result Value Ref Range Status   Specimen Description   Final    BLOOD RIGHT ANTECUBITAL Performed at [facility] [address]  [place], [place] [address]    Special Requests   Final    BOTTLES DRAWN AEROBIC AND ANAEROBIC Blood Culture results may not be optimal due to an excessive volume of blood received in culture bottles Performed at [facility] [address] [place], [place] [address]    Culture   Final    NO GROWTH 5 DAYS Performed at [facility], [place] [address] [place], [place] [address]    Report Status May 05, 2020 FINAL  Final  Urine culture     Status: None   Collection Time: 05/02/2020 12:37 PM   Specimen: In/Out Cath Urine  Result Value Ref Range Status   Specimen Description   Final    IN/OUT CATH URINE Performed at [facility] [address], [place], [place] [address]    Special Requests   Final    NONE Performed at [facility], [place] [address], [place],  [address]    Culture   Final    NO GROWTH Performed at [facility], [place] [address], [place],  [address]    Report Status 04/21/2020 FINAL  Final  MRSA PCR Screening     Status: None   Collection Time: 05/06/2020  5:45 PM   Specimen: Nasal Mucosa; Nasopharyngeal  Result Value Ref Range Status   MRSA by PCR NEGATIVE NEGATIVE Final    Comment:        The GeneXpert MRSA Assay (FDA approved for NASAL specimens only), is one component of a comprehensive MRSA colonization surveillance program. It is not intended to diagnose MRSA infection nor to guide or monitor treatment for MRSA infections. Performed at [facility], [place] [address], [place], [place] [address]   Respiratory Panel by PCR     Status: None   Collection Time: 05/10/2020  9:20 PM  Result Value Ref Range Status   Adenovirus NOT DETECTED NOT DETECTED Final   Coronavirus 229E NOT DETECTED NOT DETECTED Final    Comment: (NOTE) The Coronavirus on the Respiratory Panel, DOES NOT test for the novel  Coronavirus (2019 nCoV)    Coronavirus HKU1 NOT DETECTED NOT DETECTED Final    Coronavirus NL63 NOT DETECTED NOT DETECTED Final   Coronavirus OC43 NOT DETECTED NOT DETECTED Final   Metapneumovirus NOT DETECTED NOT DETECTED Final   Rhinovirus / Enterovirus NOT DETECTED NOT DETECTED Final   Influenza A NOT DETECTED NOT DETECTED Final   Influenza B NOT DETECTED NOT DETECTED Final   Parainfluenza Virus 1 NOT DETECTED NOT DETECTED Final   Parainfluenza Virus 2 NOT DETECTED NOT DETECTED Final   Parainfluenza Virus 3 NOT DETECTED NOT DETECTED Final   Parainfluenza Virus 4 NOT DETECTED NOT DETECTED Final   Respiratory Syncytial Virus NOT DETECTED NOT DETECTED Final   Bordetella pertussis NOT DETECTED NOT DETECTED Final   Chlamydophila pneumoniae NOT DETECTED NOT DETECTED Final   Mycoplasma pneumoniae NOT DETECTED NOT DETECTED Final    Comment: Performed at [facility], [place] [address], [place], [place] [address]         Radiology Studies: DG CHEST PORT 1 VIEW  Result Date: 05/04/2020 CLINICAL DATA:  Shortness of breath. EXAM: PORTABLE CHEST 1 VIEW COMPARISON:  04/24/2020 FINDINGS: Right arm PICC line tip is at the cavoatrial junction. Stable cardiomediastinal contours. Aortic atherosclerotic calcifications identified within the aortic arch. No change in complete atelectasis of the left lower lobe. Chronic pleuroparenchymal scarring and volume loss involving the right base. Visualized osseous structures are unremarkable. IMPRESSION: 1. No change in complete atelectasis of the left lower lobe. 2. Stable chronic pleuroparenchymal scarring and volume loss involving the right base. Electronically Signed   By: Kerby Moors M.D.   On: 04-May-2020 04:07   DG CHEST PORT 1 VIEW  Result Date: 04/24/2020 CLINICAL DATA:  Chest tightness and pharyngitis. EXAM: PORTABLE CHEST 1 VIEW COMPARISON:  04/26/2020 FINDINGS: Stable mildly enlarged cardiac silhouette, left pleural effusion and left lower lobe atelectasis. Stable small right pleural effusion and mild right basilar atelectasis  and postsurgical changes. Right PICC tip in the region of the inferior aspect of the superior vena cava, approximately 2 cm above the superior cavoatrial junction. Thoracolumbar spine degenerative changes and mild scoliosis. IMPRESSION: 1. Stable mildly enlarged cardiac silhouette and bilateral pleural effusions. 2. Stable left lower lobe atelectasis and mild right basilar atelectasis and postsurgical changes. Electronically Signed   By: Claudie Revering M.D.   On: 04/24/2020 09:20        Scheduled Meds: . aspirin  81 mg Oral Daily  . Chlorhexidine Gluconate Cloth  6 each Topical Daily  . heparin  5,000 Units Subcutaneous Q8H  . ipratropium-albuterol  3 mL Nebulization TID  . levothyroxine  100 mcg Oral Q0600  . methylPREDNISolone (SOLU-MEDROL) injection  40 mg Intravenous Daily  .  mometasone-formoterol  1 puff Inhalation BID  . sodium chloride flush  3 mL Intravenous Q12H   Continuous Infusions: . ceFEPime (MAXIPIME) IV 2 g (04/24/20 2157)  . metronidazole 500 mg (04/26/2020 5916)          Aline August, MD [facility] 2020-04-26, 7:54 AM

## 2020-05-20 NOTE — [place] Notes (Signed)
[name] [id number] 01-23-1933  CARE TEAM:  PCP: Binnie Rail, MD  Outpatient Care Team: Patient Care Team: Binnie Rail, MD as PCP - General (Internal Medicine) Deneise Lever, MD as Consulting Physician (Pulmonary Disease) Druscilla Brownie, MD (Dermatology)  Inpatient Treatment Team: Treatment Team: Attending Provider: Aline August, MD; Rounding Team: Edison Pace, Md, MD; Rounding Team: Garner Gavel, MD; Technician: Lucila Maine, NT; Student Nurse: Delbert Harness; Utilization Review: Alease Medina, RN; Technician: Gildardo Pounds, NT; Registered Nurse: Baker Pierini, RN; Registered Nurse: Margarette Canada, RN   Problem List:   Active Problems:   Chronic respiratory failure with hypoxia [facility])   COPD with acute exacerbation ([place])   Left lower quadrant abdominal pain   Hyponatremia   Failure to thrive in adult   Chronic combined systolic (congestive) and diastolic (congestive) heart failure ([place])   AKI (acute kidney injury) ([place])   Diverticulitis of colon   Severe sepsis ([place])   Diverticulitis of colon with perforation   COPD with emphysema ([place])   Pleural effusion   Systolic and diastolic CHF, chronic ([place])      * No surgery found *      Assessment: Perforated diverticulitis and elderly woman with numerous medical problems especially with chronic COPD.  Worsening hypoxia despite diuresis and supplemental oxygen  Plan:  At this point I think her natural history is unfortunately leading to a more grim prognosis.  Discussed with Dr. Starla Link.  He is discussed with palliative care.  Moving towards more comfort care.  Again agreed that her operative risks are extremely high and essentially nonsurvivable at this point, unfortunately  May 18, 2020    Subjective: (Chief complaint)  Worsening respiratory distress.  Family and hospitalist at bedside.  Objective:  Vital signs:  Vitals:   05/18/20 0325 05-18-2020 0430 18-May-2020 0449 May 18, 2020 0726    BP:  (!) 107/51    Pulse:  96    Resp:  20    Temp:      TempSrc:      SpO2: 97% 97%  96%  Weight:   74.7 kg   Height:        Last BM Date: 04/23/20  Intake/Output   Yesterday:  11/05 0701 - 11/06 0700 In: 600 [IV Piggyback:600] Out: 2950 [Urine:2950] This shift:  No intake/output data recorded.   Results:   Cultures: Recent Results (from the past 720 hour(s))  Respiratory Panel by RT PCR (Flu A&B, Covid) - Nasopharyngeal Swab     Status: None   Collection Time: 04/07/20  4:30 AM   Specimen: Nasopharyngeal Swab  Result Value Ref Range Status   SARS Coronavirus 2 by RT PCR NEGATIVE NEGATIVE Final    Comment: (NOTE) SARS-CoV-2 target nucleic acids are NOT DETECTED.  The SARS-CoV-2 RNA is generally detectable in upper respiratoy specimens during the acute phase of infection. The lowest concentration of SARS-CoV-2 viral copies this assay can detect is 131 copies/mL. A negative result does not preclude SARS-Cov-2 infection and should not be used as the sole basis for treatment or other patient management decisions. A negative result may occur with  improper specimen collection/handling, submission of specimen other than nasopharyngeal swab, presence of viral mutation(s) within the areas targeted by this assay, and inadequate number of viral copies (<131 copies/mL). A negative result must be combined with clinical observations, patient history, and epidemiological information. The expected result is Negative.  Fact Sheet for Patients:  PinkCheek.be  Fact Sheet for Healthcare Providers:  GravelBags.it  This test is no t yet approved or cleared by the [place] and  has been authorized for detection and/or diagnosis of SARS-CoV-2 by FDA under an Emergency Use Authorization (EUA). This EUA will remain  in effect (meaning this test can be used) for the duration of the COVID-19 declaration under Section  564(b)(1) of the Act, 21 U.S.C. section 360bbb-3(b)(1), unless the authorization is terminated or revoked sooner.     Influenza A by PCR NEGATIVE NEGATIVE Final   Influenza B by PCR NEGATIVE NEGATIVE Final    Comment: (NOTE) The Xpert Xpress SARS-CoV-2/FLU/RSV assay is intended as an aid in  the diagnosis of influenza from Nasopharyngeal swab specimens and  should not be used as a sole basis for treatment. Nasal washings and  aspirates are unacceptable for Xpert Xpress SARS-CoV-2/FLU/RSV  testing.  Fact Sheet for Patients: PinkCheek.be  Fact Sheet for Healthcare Providers: GravelBags.it  This test is not yet approved or cleared by the [place] FDA and  has been authorized for detection and/or diagnosis of SARS-CoV-2 by  FDA under an Emergency Use Authorization (EUA). This EUA will remain  in effect (meaning this test can be used) for the duration of the  Covid-19 declaration under Section 564(b)(1) of the Act, 21  U.S.C. section 360bbb-3(b)(1), unless the authorization is  terminated or revoked. Performed at [facility], [place] [address] [place], [place] [address]   MRSA PCR Screening     Status: None   Collection Time: 04/07/20  5:29 PM   Specimen: Nasal Mucosa; Nasopharyngeal  Result Value Ref Range Status   MRSA by PCR NEGATIVE NEGATIVE Final    Comment:        The GeneXpert MRSA Assay (FDA approved for NASAL specimens only), is one component of a comprehensive MRSA colonization surveillance program. It is not intended to diagnose MRSA infection nor to guide or monitor treatment for MRSA infections. Performed at [facility], [place] [address], [place], [place] [address]   SARS Coronavirus 2 by RT PCR (hospital order, performed in [facility] hospital lab) Nasopharyngeal Nasopharyngeal Swab     Status: None   Collection Time: 04/16/20 11:00 PM   Specimen: Nasopharyngeal  Swab  Result Value Ref Range Status   SARS Coronavirus 2 NEGATIVE NEGATIVE Final    Comment: (NOTE) SARS-CoV-2 target nucleic acids are NOT DETECTED.  The SARS-CoV-2 RNA is generally detectable in upper and lower respiratory specimens during the acute phase of infection. The lowest concentration of SARS-CoV-2 viral copies this assay can detect is 250 copies / mL. A negative result does not preclude SARS-CoV-2 infection and should not be used as the sole basis for treatment or other patient management decisions.  A negative result may occur with improper specimen collection / handling, submission of specimen other than nasopharyngeal swab, presence of viral mutation(s) within the areas targeted by this assay, and inadequate number of viral copies (<250 copies / mL). A negative result must be combined with clinical observations, patient history, and epidemiological information.  Fact Sheet for Patients:   StrictlyIdeas.no  Fact Sheet for Healthcare Providers: BankingDealers.co.za  This test is not yet approved or  cleared by the [place] FDA and has been authorized for detection and/or diagnosis of SARS-CoV-2 by FDA under an Emergency Use Authorization (EUA).  This EUA will remain in effect (meaning this test can be used) for the duration of the COVID-19 declaration under Section 564(b)(1) of the Act, 21 U.S.C. section 360bbb-3(b)(1), unless  the authorization is terminated or revoked sooner.  Performed at [facility], [place] [address], [place], [place] [address]   Blood Culture (routine x 2)     Status: None   Collection Time: 05/04/2020  7:48 AM   Specimen: BLOOD  Result Value Ref Range Status   Specimen Description   Final    BLOOD RIGHT ANTECUBITAL Performed at [place] [address], [place], [place] [address]    Special Requests   Final    BOTTLES DRAWN AEROBIC AND ANAEROBIC  Blood Culture results may not be optimal due to an excessive volume of blood received in culture bottles Performed at [place] [address], [place], [place] [address]    Culture   Final    NO GROWTH 5 DAYS Performed at [facility], [place] [address] [place], [place] [address]    Report Status 2020-04-30 FINAL  Final  Respiratory Panel by RT PCR (Flu A&B, Covid) - Nasopharyngeal Swab     Status: None   Collection Time: 05/09/2020  8:50 AM   Specimen: Nasopharyngeal Swab  Result Value Ref Range Status   SARS Coronavirus 2 by RT PCR NEGATIVE NEGATIVE Final    Comment: (NOTE) SARS-CoV-2 target nucleic acids are NOT DETECTED.  The SARS-CoV-2 RNA is generally detectable in upper respiratoy specimens during the acute phase of infection. The lowest concentration of SARS-CoV-2 viral copies this assay can detect is 131 copies/mL. A negative result does not preclude SARS-Cov-2 infection and should not be used as the sole basis for treatment or other patient management decisions. A negative result may occur with  improper specimen collection/handling, submission of specimen other than nasopharyngeal swab, presence of viral mutation(s) within the areas targeted by this assay, and inadequate number of viral copies (<131 copies/mL). A negative result must be combined with clinical observations, patient history, and epidemiological information. The expected result is Negative.  Fact Sheet for Patients:  PinkCheek.be  Fact Sheet for Healthcare Providers:  GravelBags.it  This test is no t yet approved or cleared by the [place] FDA and  has been authorized for detection and/or diagnosis of SARS-CoV-2 by FDA under an Emergency Use Authorization (EUA). This EUA will remain  in effect (meaning this test can be used) for the duration of the COVID-19 declaration under Section 564(b)(1) of the Act, 21  U.S.C. section 360bbb-3(b)(1), unless the authorization is terminated or revoked sooner.     Influenza A by PCR NEGATIVE NEGATIVE Final   Influenza B by PCR NEGATIVE NEGATIVE Final    Comment: (NOTE) The Xpert Xpress SARS-CoV-2/FLU/RSV assay is intended as an aid in  the diagnosis of influenza from Nasopharyngeal swab specimens and  should not be used as a sole basis for treatment. Nasal washings and  aspirates are unacceptable for Xpert Xpress SARS-CoV-2/FLU/RSV  testing.  Fact Sheet for Patients: PinkCheek.be  Fact Sheet for Healthcare Providers: GravelBags.it  This test is not yet approved or cleared by the [place] FDA and  has been authorized for detection and/or diagnosis of SARS-CoV-2 by  FDA under an Emergency Use Authorization (EUA). This EUA will remain  in effect (meaning this test can be used) for the duration of the  Covid-19 declaration under Section 564(b)(1) of the Act, 21  U.S.C. section 360bbb-3(b)(1), unless the authorization is  terminated or revoked. Performed at [facility], [place] [address], [place], [place] [address]   Group A Strep by PCR  Status: None   Collection Time: 05/17/2020  8:58 AM  Result Value Ref Range Status   Group A Strep by PCR NOT DETECTED NOT DETECTED Final    Comment: Performed at [facility], [place] [address], [place], [place] [address]  Blood Culture (routine x 2)     Status: None   Collection Time: 04/24/2020  9:17 AM   Specimen: BLOOD  Result Value Ref Range Status   Specimen Description   Final    BLOOD RIGHT ANTECUBITAL Performed at [place] [address], [place], [place] [address]    Special Requests   Final    BOTTLES DRAWN AEROBIC AND ANAEROBIC Blood Culture results may not be optimal due to an excessive volume of blood received in culture bottles Performed at [place]  [address], [place], [place] [address]    Culture   Final    NO GROWTH 5 DAYS Performed at [facility], [place] [address], [place], [place] [address]    Report Status 2020-04-27 FINAL  Final  Urine culture     Status: None   Collection Time: 05/06/2020 12:37 PM   Specimen: In/Out Cath Urine  Result Value Ref Range Status   Specimen Description   Final    IN/OUT CATH URINE Performed at [place] [address] [place], [place] [address]    Special Requests   Final    NONE Performed at [facility], [place] [address] [place], [place] [address]    Culture   Final    NO GROWTH Performed at [facility], [place] [address], [place],  [address]    Report Status 04/21/2020 FINAL  Final  MRSA PCR Screening     Status: None   Collection Time: 05/10/2020  5:45 PM   Specimen: Nasal Mucosa; Nasopharyngeal  Result Value Ref Range Status   MRSA by PCR NEGATIVE NEGATIVE Final    Comment:        The GeneXpert MRSA Assay (FDA approved for NASAL specimens only), is one component of a comprehensive MRSA colonization surveillance program. It is not intended to diagnose MRSA infection nor to guide or monitor treatment for MRSA infections. Performed at [facility], [place] [address] [place],  [address]   Respiratory Panel by PCR     Status: None   Collection Time: 04/21/2020  9:20 PM  Result Value Ref Range Status   Adenovirus NOT DETECTED NOT DETECTED Final   Coronavirus 229E NOT DETECTED NOT DETECTED Final    Comment: (NOTE) The Coronavirus on the Respiratory Panel, DOES NOT test for the novel  Coronavirus (2019 nCoV)    Coronavirus HKU1 NOT DETECTED NOT DETECTED Final   Coronavirus NL63 NOT DETECTED NOT DETECTED Final   Coronavirus OC43 NOT DETECTED NOT DETECTED Final   Metapneumovirus NOT DETECTED NOT DETECTED Final   Rhinovirus / Enterovirus NOT DETECTED NOT DETECTED Final   Influenza A NOT DETECTED NOT  DETECTED Final   Influenza B NOT DETECTED NOT DETECTED Final   Parainfluenza Virus 1 NOT DETECTED NOT DETECTED Final   Parainfluenza Virus 2 NOT DETECTED NOT DETECTED Final   Parainfluenza Virus 3 NOT DETECTED NOT DETECTED Final   Parainfluenza Virus 4 NOT DETECTED NOT DETECTED Final   Respiratory Syncytial Virus NOT DETECTED NOT DETECTED Final   Bordetella pertussis NOT DETECTED NOT DETECTED Final   Chlamydophila pneumoniae NOT DETECTED NOT DETECTED Final   Mycoplasma pneumoniae NOT DETECTED  NOT DETECTED Final    Comment: Performed at [facility], [place] [address], [place], [place] [address]    Labs: Results for orders placed or performed during the hospital encounter of 05/07/2020 (from the past 48 hour(s))  Comprehensive metabolic panel     Status: Abnormal   Collection Time: 04/24/20  3:29 AM  Result Value Ref Range   Sodium 135 135 - 145 mmol/L   Potassium 2.9 (L) 3.5 - 5.1 mmol/L    Comment: DELTA CHECK NOTED NO VISIBLE HEMOLYSIS    Chloride 95 (L) 98 - 111 mmol/L   CO2 30 22 - 32 mmol/L   Glucose, Bld 106 (H) 70 - 99 mg/dL    Comment: Glucose reference range applies only to samples taken after fasting for at least 8 hours.   BUN 15 8 - 23 mg/dL   Creatinine, Ser 0.80 0.44 - 1.00 mg/dL   Calcium 8.5 (L) 8.9 - 10.3 mg/dL   Total Protein 5.3 (L) 6.5 - 8.1 g/dL   Albumin 2.6 (L) 3.5 - 5.0 g/dL   AST 27 15 - 41 U/L   ALT 24 0 - 44 U/L   Alkaline Phosphatase 91 38 - 126 U/L   Total Bilirubin 0.6 0.3 - 1.2 mg/dL   GFR, Estimated >60 >60 mL/min    Comment: (NOTE) Calculated using the CKD-EPI Creatinine Equation (2021)    Anion gap 10 5 - 15    Comment: Performed at [facility], [place] [address], [place], [place] [address]  Magnesium     Status: None   Collection Time: 04/24/20  3:29 AM  Result Value Ref Range   Magnesium 2.0 1.7 - 2.4 mg/dL    Comment: Performed at [facility], [place] [address] [place], [place] [address]  CBC with  Differential/Platelet     Status: Abnormal   Collection Time: 04/24/20  3:29 AM  Result Value Ref Range   WBC 14.4 (H) 4.0 - 10.5 K/uL   RBC 2.94 (L) 3.87 - 5.11 MIL/uL   Hemoglobin 9.1 (L) 12.0 - 15.0 g/dL   HCT 29.1 (L) 36 - 46 %   MCV 99.0 80.0 - 100.0 fL   MCH 31.0 26.0 - 34.0 pg   MCHC 31.3 30.0 - 36.0 g/dL   RDW 14.4 11.5 - 15.5 %   Platelets 293 150 - 400 K/uL   nRBC 0.0 0.0 - 0.2 %   Neutrophils Relative % 88 %   Neutro Abs 12.7 (H) 1.7 - 7.7 K/uL   Lymphocytes Relative 7 %   Lymphs Abs 1.0 0.7 - 4.0 K/uL   Monocytes Relative 4 %   Monocytes Absolute 0.5 0.1 - 1.0 K/uL   Eosinophils Relative 0 %   Eosinophils Absolute 0.0 0.0 - 0.5 K/uL   Basophils Relative 0 %   Basophils Absolute 0.0 0.0 - 0.1 K/uL   Immature Granulocytes 1 %   Abs Immature Granulocytes 0.13 (H) 0.00 - 0.07 K/uL    Comment: Performed at [facility], [place] [address], [place], [place] [address]  Troponin I (High Sensitivity)     Status: Abnormal   Collection Time: 04/24/20  9:27 AM  Result Value Ref Range   Troponin I (High Sensitivity) 20 (H) <18 ng/L    Comment: (NOTE) Elevated high sensitivity troponin I (hsTnI) values and significant  changes across serial measurements may suggest ACS but many other  chronic and acute conditions are known to elevate hsTnI results.  Refer to  the "Links" section for chest pain algorithms and additional  guidance. Performed at [facility], [place] [address] [place], [place] [address]   Troponin I (High Sensitivity)     Status: Abnormal   Collection Time: 04/24/20 11:04 AM  Result Value Ref Range   Troponin I (High Sensitivity) 20 (H) <18 ng/L    Comment: (NOTE) Elevated high sensitivity troponin I (hsTnI) values and significant  changes across serial measurements may suggest ACS but many other  chronic and acute conditions are known to elevate hsTnI results.  Refer to the "Links" section for chest pain algorithms and  additional  guidance. Performed at [facility], [place] [address] [place], [place] [address]   Glucose, capillary     Status: Abnormal   Collection Time: May 10, 2020  2:57 AM  Result Value Ref Range   Glucose-Capillary 100 (H) 70 - 99 mg/dL    Comment: Glucose reference range applies only to samples taken after fasting for at least 8 hours.  Comprehensive metabolic panel     Status: Abnormal   Collection Time: 05-10-20  4:02 AM  Result Value Ref Range   Sodium 133 (L) 135 - 145 mmol/L   Potassium 3.6 3.5 - 5.1 mmol/L    Comment: DELTA CHECK NOTED   Chloride 92 (L) 98 - 111 mmol/L   CO2 29 22 - 32 mmol/L   Glucose, Bld 90 70 - 99 mg/dL    Comment: Glucose reference range applies only to samples taken after fasting for at least 8 hours.   BUN 17 8 - 23 mg/dL   Creatinine, Ser 0.87 0.44 - 1.00 mg/dL   Calcium 8.4 (L) 8.9 - 10.3 mg/dL   Total Protein 5.4 (L) 6.5 - 8.1 g/dL   Albumin 2.7 (L) 3.5 - 5.0 g/dL   AST 37 15 - 41 U/L   ALT 26 0 - 44 U/L   Alkaline Phosphatase 89 38 - 126 U/L   Total Bilirubin 0.9 0.3 - 1.2 mg/dL   GFR, Estimated >60 >60 mL/min    Comment: (NOTE) Calculated using the CKD-EPI Creatinine Equation (2021)    Anion gap 12 5 - 15    Comment: Performed at [facility], [place] [address] [place], [place] [address]  Magnesium     Status: None   Collection Time: 05-10-2020  4:02 AM  Result Value Ref Range   Magnesium 1.9 1.7 - 2.4 mg/dL    Comment: Performed at [facility], [place] [address], [place], [place] [address]  CBC with Differential/Platelet     Status: Abnormal   Collection Time: 2020/05/10  4:02 AM  Result Value Ref Range   WBC 14.9 (H) 4.0 - 10.5 K/uL   RBC 2.79 (L) 3.87 - 5.11 MIL/uL   Hemoglobin 8.7 (L) 12.0 - 15.0 g/dL   HCT 28.2 (L) 36 - 46 %   MCV 101.1 (H) 80.0 - 100.0 fL   MCH 31.2 26.0 - 34.0 pg   MCHC 30.9 30.0 - 36.0 g/dL   RDW 14.5 11.5 - 15.5 %   Platelets 248 150 - 400 K/uL   nRBC 0.0  0.0 - 0.2 %   Neutrophils Relative % 89 %   Neutro Abs 13.2 (H) 1.7 - 7.7 K/uL   Lymphocytes Relative 6 %   Lymphs Abs 1.0 0.7 - 4.0 K/uL   Monocytes Relative 4 %   Monocytes Absolute 0.6 0.1 - 1.0 K/uL   Eosinophils Relative 0 %   Eosinophils  Absolute 0.0 0.0 - 0.5 K/uL   Basophils Relative 0 %   Basophils Absolute 0.0 0.0 - 0.1 K/uL   Immature Granulocytes 1 %   Abs Immature Granulocytes 0.13 (H) 0.00 - 0.07 K/uL    Comment: Performed at [facility], [place] [address], [place], [place] [address]    Imaging / Studies: DG CHEST PORT 1 VIEW  Result Date: 2020-04-26 CLINICAL DATA:  Shortness of breath. EXAM: PORTABLE CHEST 1 VIEW COMPARISON:  04/24/2020 FINDINGS: Right arm PICC line tip is at the cavoatrial junction. Stable cardiomediastinal contours. Aortic atherosclerotic calcifications identified within the aortic arch. No change in complete atelectasis of the left lower lobe. Chronic pleuroparenchymal scarring and volume loss involving the right base. Visualized osseous structures are unremarkable. IMPRESSION: 1. No change in complete atelectasis of the left lower lobe. 2. Stable chronic pleuroparenchymal scarring and volume loss involving the right base. Electronically Signed   By: Kerby Moors M.D.   On: 04-26-2020 04:07   DG CHEST PORT 1 VIEW  Result Date: 04/24/2020 CLINICAL DATA:  Chest tightness and pharyngitis. EXAM: PORTABLE CHEST 1 VIEW COMPARISON:  05/15/2020 FINDINGS: Stable mildly enlarged cardiac silhouette, left pleural effusion and left lower lobe atelectasis. Stable small right pleural effusion and mild right basilar atelectasis and postsurgical changes. Right PICC tip in the region of the inferior aspect of the superior vena cava, approximately 2 cm above the superior cavoatrial junction. Thoracolumbar spine degenerative changes and mild scoliosis. IMPRESSION: 1. Stable mildly enlarged cardiac silhouette and bilateral pleural effusions. 2. Stable left  lower lobe atelectasis and mild right basilar atelectasis and postsurgical changes. Electronically Signed   By: Claudie Revering M.D.   On: 04/24/2020 09:20    Medications / Allergies: per chart  Antibiotics: Anti-infectives (From admission, onward)   Start     Dose/Rate Route Frequency Ordered Stop   04/22/20 1000  vancomycin (VANCOCIN) IVPB 1000 mg/200 mL premix  Status:  Discontinued        1,000 mg 200 mL/hr over 60 Minutes Intravenous Every 36 hours 05/16/2020 1453 04/21/20 1246   04/21/20 2200  ceFEPIme (MAXIPIME) 2 g in sodium chloride 0.9 % 100 mL IVPB        2 g 200 mL/hr over 30 Minutes Intravenous Every 12 hours 04/21/20 1246     04/21/20 1400  vancomycin (VANCOCIN) IVPB 1000 mg/200 mL premix  Status:  Discontinued        1,000 mg 200 mL/hr over 60 Minutes Intravenous Every 24 hours 04/21/20 1246 04/21/20 2230   04/21/20 1000  ceFEPIme (MAXIPIME) 2 g in sodium chloride 0.9 % 100 mL IVPB  Status:  Discontinued        2 g 200 mL/hr over 30 Minutes Intravenous Every 24 hours 05/16/2020 1453 04/21/20 1246   04/30/2020 2200  metroNIDAZOLE (FLAGYL) IVPB 500 mg        500 mg 100 mL/hr over 60 Minutes Intravenous Every 8 hours 05/17/2020 1340     05/05/2020 1345  ceFEPIme (MAXIPIME) 2 g in sodium chloride 0.9 % 100 mL IVPB  Status:  Discontinued        2 g 200 mL/hr over 30 Minutes Intravenous  Once 04/24/2020 1340 04/30/2020 1454   05/05/2020 1345  vancomycin (VANCOCIN) IVPB 1000 mg/200 mL premix  Status:  Discontinued        1,000 mg 200 mL/hr over 60 Minutes Intravenous  Once 05/08/2020 1340 05/03/2020 1454   05/12/2020 1115  ceFEPIme (MAXIPIME) 2 g in sodium chloride 0.9 %  100 mL IVPB        2 g 200 mL/hr over 30 Minutes Intravenous  Once 05/10/2020 1105 04/21/2020 1156   05/11/2020 1115  metroNIDAZOLE (FLAGYL) IVPB 500 mg        500 mg 100 mL/hr over 60 Minutes Intravenous  Once 05/06/2020 1105 04/30/2020 1442   05/15/2020 1115  vancomycin (VANCOCIN) IVPB 1000 mg/200 mL premix        1,000 mg 200 mL/hr over  60 Minutes Intravenous  Once 05/10/2020 1105 04/24/2020 1253        Note: Portions of this report may have been transcribed using voice recognition software. Every effort was made to ensure accuracy; however, inadvertent computerized transcription errors may be present.   Any transcriptional errors that result from this process are unintentional.    Adin Hector, MD, FACS, MASCRS Gastrointestinal and Minimally Invasive Surgery  [facility] [address], [place], [place] [address] [phone] Fax [phone] Main/Paging  CONTACT INFORMATION: Weekday (9AM-5PM) concerns: Call [facility] main office at [phone] Weeknight (5PM-9AM) or Weekend/Holiday concerns: Check [email or web address] for General Surgery [facility] coverage (Please, do not use SecureChat as it is not reliable communication to operating surgeons for immediate patient care)      23-May-2020  8:32 AM

## 2020-05-20 NOTE — Progress Notes (Signed)
RN called to room by nurse tech.  Family at bedside. Pt without respirations or pulse.  Pt is DNR and comfort care.  2 RN's  auscultated for respirations and pulse for 1 minute each. Pt death pronounced by Probation officer and second Therapist, sports, [employer].  Kennedy Brines, Laurel Dimmer, RN

## 2020-05-20 NOTE — Plan of Care (Signed)
  Problem: Education: Goal: Knowledge of General Education information will improve Description: Including pain rating scale, medication(s)/side effects and non-pharmacologic comfort measures Outcome: Completed/Met   Problem: Health Behavior/Discharge Planning: Goal: Ability to manage health-related needs will improve Outcome: Completed/Met   Problem: Clinical Measurements: Goal: Ability to maintain clinical measurements within normal limits will improve Outcome: Completed/Met Goal: Will remain free from infection Outcome: Completed/Met Goal: Diagnostic test results will improve Outcome: Completed/Met Goal: Respiratory complications will improve Outcome: Completed/Met Goal: Cardiovascular complication will be avoided Outcome: Completed/Met   Problem: Activity: Goal: Risk for activity intolerance will decrease Outcome: Completed/Met   Problem: Nutrition: Goal: Adequate nutrition will be maintained Outcome: Completed/Met   Problem: Coping: Goal: Level of anxiety will decrease Outcome: Completed/Met   Problem: Elimination: Goal: Will not experience complications related to bowel motility Outcome: Completed/Met Goal: Will not experience complications related to urinary retention Outcome: Completed/Met   Problem: Pain Managment: Goal: General experience of comfort will improve Outcome: Completed/Met   Problem: Safety: Goal: Ability to remain free from injury will improve Outcome: Completed/Met   Problem: Skin Integrity: Goal: Risk for impaired skin integrity will decrease Outcome: Completed/Met   Patient's family at bedside and educated, especially the patient's daughter.

## 2020-05-20 NOTE — Discharge Summary (Signed)
Death Summary  [name] ION:[id number] DOB: Aug 14, 1932 DOA: 04-24-2020  PCP: Binnie Rail, MD  Admit date: 04-24-20 Date of Death: 04-29-2020 Time of Death: Sep 01, 1218   History of present illness:  86 year old female with history of COPD, chronic hypoxic respiratory failure on 3 L/min oxygen via nasal cannula, chronic hyponatremia, hyperlipidemia, chronic systolic and diastolic heart failure, hypothyroidism, recent admission from 04/07/2020-04/17/2020 with multiple medical issues including alcohol withdrawal seizure, non-STEMI, COPD exacerbation, CAP and hyponatremia with subsequent discharge to SNF presented on 24-Apr-2020 with sore throat, lethargy, poor oral intake and chest tightness.  On presentation, she was hypotensive, requiring 5 L oxygen via nasal cannula with sodium of 124, creatinine 1.38, troponin flat at 20x2, WBC of 15.5.  COVID-19 and influenza test were negative and throat swab was negative for strep.  CT of the abdomen and pelvis showed moderate volume pneumoperitoneum possibly from perforated acute sigmoid/distal descending colon diverticulitis with possible ileus.  CT of the chest showed left lower lobe consolidation and opacification of left lower lobe bronchi, favor atelectasis along with bilateral small pleural effusions.  She was started on broad-spectrum antibiotics.  General surgery was consulted; recommended conservative management.  PCCM was also consulted.  Palliative care was consulted for goals of care discussion.  During the hospitalization, her condition did not improve.  Her respiratory status worsened.  After discussion with patient's daughter, she was made comfort measures on Apr 29, 2020.  She passed away on 2020-04-29 at 1220pm.  Family was at bedside.  Final Diagnoses:   Severe sepsis: Present on admission Acute diverticulitis of sigmoid/descending colon with perforation and pneumoperitoneum COPD/emphysema Acute on chronic hypoxic respiratory failure on 3 L  oxygen via nasal cannula at home Bilateral pleural effusion Acute on chronic systolic and diastolic heart failure Recent non-STEMI Hypertension Hypokalemia Leukocytosis Acute on chronic hyponatremia AKI Generalized deconditioning  The results of significant diagnostics from this hospitalization (including imaging, microbiology, ancillary and laboratory) are listed below for reference.    Significant Diagnostic Studies: EEG  Result Date: 04/07/2020 Lora Havens, MD     04/07/2020  5:21 PM Patient Name: [name] MRN: [id number] Epilepsy Attending: Lora Havens Referring Physician/Provider: Dr Gean Birchwood Date: 04/07/2020 Duration: 24.05 mins Patient history: 86yo F with seizures in setting of hyponatremia. EEG to evaluate for seizure Level of alertness: Awake AEDs during EEG study: None Technical aspects: This EEG study was done with scalp electrodes positioned according to the 10-20 International system of electrode placement. Electrical activity was acquired at a sampling rate of 500Hz  and reviewed with a high frequency filter of 70Hz  and a low frequency filter of 1Hz . EEG data were recorded continuously and digitally stored. Description: The posterior dominant rhythm consists of 9-10 Hz activity of moderate voltage (25-35 uV) seen predominantly in posterior head regions, symmetric and reactive to eye opening and eye closing. There is also an excessive amount of 15 to 18 Hz beta activity distributed symmetrically and diffusely.  Hyperventilation and photic stimulation were not performed.   ABNORMALITY -Excessive beta, generalized IMPRESSION: This study is within normal limits. No seizures or epileptiform discharges were seen throughout the recording. The excessive beta activity seen in the background is most likely due to the effect of benzodiazepine and is a benign EEG pattern. Priyanka Barbra Sarks   CT ABDOMEN PELVIS WO CONTRAST  Result Date: 2020/04/24 CLINICAL DATA:   Generalized weakness and sepsis. Suspected diverticulitis. EXAM: CT ABDOMEN AND PELVIS WITHOUT CONTRAST TECHNIQUE: Multidetector CT imaging of the abdomen and pelvis was  performed. CONTRAST:  Intravenous contrast not administered. PO contrast administered. COMPARISON:  X-ray abdomen 05/17/2020, CT abdomen pelvis 04/07/2020, CT chest 05/06/2020 FINDINGS: Lower chest: Please see separately dictated CT chest 04/28/2020. Hepatobiliary: No focal liver abnormality is seen. Calcified gallstone within the gallbladder lumen. No gallbladder wall thickening. No biliary ductal dilatation. Pancreas: Unremarkable. No pancreatic ductal dilatation or surrounding inflammatory changes. Spleen: Normal in size without focal abnormality. Adrenals/Urinary Tract: No adrenal nodule bilaterally. Bilateral renal cortical scarring. No nephrolithiasis, no hydronephrosis, and no contour-deforming renal mass. No ureterolithiasis or hydroureter. The urinary bladder is unremarkable. Stomach/Bowel: Small hiatal hernia. No PO contrast extravasation from the partially opacified lumen of the small bowel and rectum. The small bowel is distended with fluid and gas measuring up to 3.3 cm in caliber on coronal imaging. No associated transition point. No small bowel wall thickening. No pneumatosis. Diffuse sigmoid diverticulosis with mild bowel wall thickening of the distal descending colon/proximal sigmoid colon (no small large bowel obstruction. The appendix is unremarkable. Vascular/Lymphatic: No abdominal aorta or iliac aneurysm. Severe atherosclerotic plaque of the aorta and its branches. No abdominal, pelvic, or inguinal lymphadenopathy. Reproductive: Status post hysterectomy. No adnexal masses. Other: Moderate volume free gas within the abdomen. Several foci of gas along the proximal sigmoid colon as well as along the left pericolic gutter. Trace simple free fluid within the abdomen and pelvis. No organized fluid collection. No high density simple  free fluid within the abdomen or pelvis. Musculoskeletal: Diffuse at least moderate subcutaneus soft tissue edema. Diffuse decreased bone density. No suspicious lytic or blastic osseous lesions. No acute displaced fracture. Multilevel degenerative changes of the spine. Severe right hip degenerative changes with chronic appearing destruction of the right femoral head and associated posterolateral subluxation in relation to the acetabulum. At least mild degenerative changes of the left hip. IMPRESSION: 1. Moderate volume pneumoperitoneum likely arising from a perforated acute sigmoid/distal descending colon diverticulitis. Associated small volume simple free fluid. No associated organized fluid collection. 2. Mild diffuse small bowel distension with no associated transition point suggestive of an ileus. 3. Diffuse at least moderate subcutaneus soft tissue edema. 4.  Aortic Atherosclerosis (ICD10-I70.0) - severe. 5. Please see separately dictated CT chest 04/28/2020. These results were called by telephone at the time of interpretation on 05/16/2020 at 10:21 pm to provider Lang Snow, who verbally acknowledged these results. Electronically Signed   By: Iven Finn M.D.   On: 05/10/2020 22:24   DG Chest 2 View  Result Date: 04/07/2020 CLINICAL DATA:  86 year old female with shortness of breath.  COPD. EXAM: CHEST - 2 VIEW COMPARISON:  03/12/2020 chest radiographs and earlier. FINDINGS: Upright AP and lateral views. 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 Right lung base calcified pleural plaques demonstrated by CT in 2018. Mediastinal contours are stable. No pneumothorax. But there is increased reticulonodular opacity in the right lower lung compared to March of this year. Left lung appears stable. Calcified aortic atherosclerosis. Osteopenia. No acute osseous abnormality identified. Paucity of bowel gas in the upper abdomen. IMPRESSION: 1. Chronic lung  disease with increased reticulonodular opacity in the right lower lung compatible with acute infectious exacerbation. 2. Aortic Atherosclerosis (ICD10-I70.0) and Emphysema (ICD10-J43.9). Electronically Signed   By: Genevie Ann M.D.   On: 04/07/2020 03:05   CT Head Wo Contrast  Result Date: 04/07/2020 CLINICAL DATA:  Nontraumatic seizure EXAM: CT HEAD WITHOUT CONTRAST TECHNIQUE: Contiguous axial images were obtained from the base of the skull through the vertex without intravenous  contrast. COMPARISON:  01/08/2020 FINDINGS: Brain: No evidence of acute infarction, hemorrhage, hydrocephalus, extra-axial collection or mass lesion/mass effect. Generalized atrophy with chronic small vessel ischemic low-density in the deep white matter. Vascular: No hyperdense vessel or unexpected calcification. Skull: Normal. Negative for fracture or focal lesion. Sinuses/Orbits: No acute finding.  Bilateral cataract resection. Other: Motion degraded. IMPRESSION: 1. Aging brain without acute superimposed finding. 2. Intermittent motion artifact. Electronically Signed   By: Monte Fantasia M.D.   On: 04/07/2020 04:23   CT CHEST WO CONTRAST  Result Date: 05/04/2020 CLINICAL DATA:  COPD exacerbation, generalized weakness, sepsis EXAM: CT CHEST WITHOUT CONTRAST TECHNIQUE: Multidetector CT imaging of the chest was performed following the standard protocol without IV contrast. COMPARISON:  05/13/2020 FINDINGS: Cardiovascular: There is extensive atherosclerosis of the aorta and coronary vasculature. No pericardial effusion. The heart is not enlarged. Mediastinum/Nodes: No enlarged mediastinal or axillary lymph nodes. Thyroid gland, trachea, and esophagus demonstrate no significant findings. Lungs/Pleura: There is diffuse emphysema. Small bilateral pleural effusions are identified, left greater than right. Dense left lower lobe consolidation with opacification of the left lower lobe segmental bronchi, favor atelectasis. No pneumothorax.  Upper Abdomen: Please refer to CT abdomen report for important findings in that region related to pneumoperitoneum. Musculoskeletal: No acute or destructive bony lesions. Reconstructed images demonstrate no additional findings. IMPRESSION: 1. Left lower lobe consolidation and opacification of left lower lobe bronchi, favor atelectasis. 2. Small bilateral pleural effusions, left greater than right. 3. Aortic Atherosclerosis (ICD10-I70.0) and Emphysema (ICD10-J43.9). 4. Please refer to separately reported CT abdomen and pelvis exam describing important findings in that region. Electronically Signed   By: Randa Ngo M.D.   On: 05/16/2020 22:08   CT Angio Chest PE W and/or Wo Contrast  Result Date: 04/07/2020 CLINICAL DATA:  Positive D-dimer.  Can not catch breath EXAM: CT ANGIOGRAPHY CHEST WITH CONTRAST TECHNIQUE: Multidetector CT imaging of the chest was performed using the standard protocol during bolus administration of intravenous contrast. Multiplanar CT image reconstructions and MIPs were obtained to evaluate the vascular anatomy. CONTRAST:  165mL OMNIPAQUE IOHEXOL 350 MG/ML SOLN COMPARISON:  02/28/2017 FINDINGS: Cardiovascular: Normal heart size. No pericardial effusion. Advanced atheromatous calcification of the aorta and coronaries. The apical chest was excluded from the field of view on the CTA phase but was covered on a subsequent phase. Bulky calcification causes high-grade narrowing at the left subclavian artery. Mediastinum/Nodes: Negative for adenopathy or mass. Lungs/Pleura: Emphysema and airway thickening. Chronic bronchial at the right lower lobe after prior surgery. There is pleural based calcification and volume loss at the right lower lobe. Subpleural opacity in the right lower lobe not seen in 2018. Bilateral interlobular septal thickening. Upper Abdomen: Reported separately Musculoskeletal: No acute finding. Review of the MIP images confirms the above findings. IMPRESSION: 1. No evidence  of pulmonary embolism. 2. Right lower lobe pneumonia or atelectasis. 3. Borderline interstitial edema 4. Advanced COPD with asymmetric right-sided scarring where there has been prior lung cancer resection. 5. Advanced atherosclerosis. Aortic Atherosclerosis (ICD10-I70.0) and Emphysema (ICD10-J43.9). Electronically Signed   By: Monte Fantasia M.D.   On: 04/07/2020 07:08   CT ABDOMEN PELVIS W CONTRAST  Result Date: 04/07/2020 CLINICAL DATA:  Epigastric pain EXAM: CT ABDOMEN AND PELVIS WITH CONTRAST TECHNIQUE: Multidetector CT imaging of the abdomen and pelvis was performed using the standard protocol following bolus administration of intravenous contrast. CONTRAST:  146mL OMNIPAQUE IOHEXOL 350 MG/ML SOLN COMPARISON:  01/01/2020 FINDINGS: Lower chest:  Reported on dedicated chest CT. Hepatobiliary: Possible steatosis of  the liver.At least 1 calcified gallstone. No findings of cholecystitis. No bile duct dilatation. Pancreas: Unremarkable. Spleen: Unremarkable. Adrenals/Urinary Tract: Negative adrenals. No hydronephrosis or stone. Unremarkable bladder. Stomach/Bowel: Rectocele appearance. No bowel obstruction or visible inflammation. Numerous left colonic diverticula. Negative appendix. Vascular/Lymphatic: Diffuse atheromatous calcification with multiple narrowed visceral arteries, not well assessed on this study. No mass or adenopathy. Reproductive:Hysterectomy.  Pelvic floor laxity Other: Loculated fluid in the low left pelvis, likely peritoneal inclusion cyst given stability from prior. Musculoskeletal: Remarkably severe right hip osteoarthritis with femoral head collapse and fragmentation. Generalized osteopenia and advanced spinal degeneration. IMPRESSION: 1. Motion degraded CT without acute finding. 2. Numerous chronic findings are stable from July 2021 and described above. Electronically Signed   By: Monte Fantasia M.D.   On: 04/07/2020 07:20   DG CHEST PORT 1 VIEW  Result Date: 05-21-20 CLINICAL  DATA:  Shortness of breath. EXAM: PORTABLE CHEST 1 VIEW COMPARISON:  04/24/2020 FINDINGS: Right arm PICC line tip is at the cavoatrial junction. Stable cardiomediastinal contours. Aortic atherosclerotic calcifications identified within the aortic arch. No change in complete atelectasis of the left lower lobe. Chronic pleuroparenchymal scarring and volume loss involving the right base. Visualized osseous structures are unremarkable. IMPRESSION: 1. No change in complete atelectasis of the left lower lobe. 2. Stable chronic pleuroparenchymal scarring and volume loss involving the right base. Electronically Signed   By: Kerby Moors M.D.   On: 2020/05/21 04:07   DG CHEST PORT 1 VIEW  Result Date: 04/24/2020 CLINICAL DATA:  Chest tightness and pharyngitis. EXAM: PORTABLE CHEST 1 VIEW COMPARISON:  05/06/2020 FINDINGS: Stable mildly enlarged cardiac silhouette, left pleural effusion and left lower lobe atelectasis. Stable small right pleural effusion and mild right basilar atelectasis and postsurgical changes. Right PICC tip in the region of the inferior aspect of the superior vena cava, approximately 2 cm above the superior cavoatrial junction. Thoracolumbar spine degenerative changes and mild scoliosis. IMPRESSION: 1. Stable mildly enlarged cardiac silhouette and bilateral pleural effusions. 2. Stable left lower lobe atelectasis and mild right basilar atelectasis and postsurgical changes. Electronically Signed   By: Claudie Revering M.D.   On: 04/24/2020 09:20   DG Chest Port 1 View  Result Date: 05/16/2020 CLINICAL DATA:  Shortness of breath. EXAM: PORTABLE CHEST 1 VIEW COMPARISON:  April 07, 2020. FINDINGS: Stable cardiomediastinal silhouette. No pneumothorax is noted. Mild bibasilar subsegmental atelectasis is noted with probable small pleural effusions. Bony thorax is unremarkable. IMPRESSION: Mild bibasilar subsegmental atelectasis with probable small pleural effusions. Aortic Atherosclerosis (ICD10-I70.0).  Electronically Signed   By: Marijo Conception M.D.   On: 04/30/2020 08:53   DG Abd Portable 1V  Result Date: 05/13/2020 CLINICAL DATA:  Generalized abdominal pain. EXAM: PORTABLE ABDOMEN - 1 VIEW COMPARISON:  None. FINDINGS: Mildly dilated small bowel loops are noted concerning for distal small bowel obstruction or ileus. No colonic dilatation is noted. No radio-opaque calculi or other significant radiographic abnormality are seen. IMPRESSION: Mildly dilated small bowel loops are noted concerning for distal small bowel obstruction or ileus. Electronically Signed   By: Marijo Conception M.D.   On: 05/12/2020 14:34   DG Swallowing Func-Speech Pathology  Result Date: 04/11/2020 Objective Swallowing Evaluation: Type of Study: MBS-Modified Barium Swallow Study  Patient Details Name: [name] MRN: [id number] Date of Birth: 1932/11/17 Today's Date: 04/11/2020 Time: SLP Start Time (ACUTE ONLY): 1450 -SLP Stop Time (ACUTE ONLY): 1510 SLP Time Calculation (min) (ACUTE ONLY): 20 min Past Medical History: Past Medical History: Diagnosis Date .  Allergic rhinitis  . Anxiety  . Arthritis   "back, hands; hips" (12/22/2014) . Asthma  . Bladder atony  . Chronic bronchitis ([place])  . COPD (chronic obstructive pulmonary disease) ([place])  . Diarrhea  . Emphysema of lung ([place])  . GERD (gastroesophageal reflux disease)   "w/spicey foods" (12/22/2014) . Hyperlipidemia  . Non-small cell carcinoma of lung, stage 1 ([place]) 2005  1.4 cm Poorly differentiated Squamous cell RUL  T1N0 resected 09/22/2003 . On home oxygen therapy   "3L q night and prn during the day" (12/22/2014) . Pelvic cyst  . Pneumonia   "this is the 3rd time that I can remember" (12/22/2014) . Unspecified hypothyroidism  Past Surgical History: Past Surgical History: Procedure Laterality Date . ABDOMINAL HYSTERECTOMY  ~ 1976 . BACK SURGERY   . CATARACT EXTRACTION W/ INTRAOCULAR LENS  IMPLANT, BILATERAL Bilateral ~ 2012 . DILATION AND CURETTAGE OF UTERUS   . LUMBAR DISC SURGERY   1970's . LYMPH NODE DISSECTION  2005  "all my lymph nodes under breasts, went thru my back; Dr. Arlyce Dice did it" . Needle aspiration Pelvic cyst  2011  Dr Diona Fanti . VIDEO ASSISTED THORACOSCOPY (VATS)/THOROCOTOMY Right 09/2003  thoracotomy, right upper lobectomy with node dissection; Dr Demetrios Loll 11/02/2010 . VIDEO BRONCHOSCOPY  03/2004  Archie Endo 11/02/2010 HPI: 86 yo female adm to [facility] with AMS, COPD exacerbation and n/v, hospital coarse complicated by seizure.  Pt CT abdomen showed right LL pna.  CT head s howed againg brain without acute changes.  Pt is on 3 liters of oxygen at home at baseline.   Pt with H/O ETOH use, lung cancer s/p partial lobectomy, COPD.   She is currently on heparain for NSTEMI.  Pt admits to h/o reflux and frequent belching for which she took a PPI for a period of time. Pt is essentially chair bound at home within the last few months due to frequent falls in the last few months.  Subjective: pleasant, alert, daughter present prior to and after MBS for education Assessment / Plan / Recommendation CHL IP CLINICAL IMPRESSIONS 04/11/2020 Clinical Impression Patient presents with a mild oral and a mild-mod pharyngeal dysphagia without aspiration of any tested consistencies (thin, puree, regular solids, barium tablet). She exhibited flash penetration during the swallow with thin liquids via straw sips, mixed consistencies (sips of liquids during regular solids intake) but full clearance of penetrate observed each time. Patient with swallow initiation delay at level of vallecular sinus with thin liquids and exhbiited trace to minimal vallecular sinus residuals with puree solids, regular solids and trace residuals with thin liquids which fully cleared after subsequent, uncued swallows. Presence of two cervical osteophytes (no radiologist to confirm) impeding into pharynx but not significantly impacting pharyngeal phase of swallow. Aspiration risk related to dysphagia is in mild range however her  COPD/respiratory issues do put her at a higher risk and patient is advised to monitor respiratory rate and oxygen saturations and take rest breaks when eating. SLP Visit Diagnosis Dysphagia, oropharyngeal phase (R13.12) Attention and concentration deficit following -- Frontal lobe and executive function deficit following -- Impact on safety and function Mild aspiration risk   CHL IP TREATMENT RECOMMENDATION 04/11/2020 Treatment Recommendations Therapy as outlined in treatment plan below   Prognosis 04/11/2020 Prognosis for Safe Diet Advancement Good Barriers to Reach Goals -- Barriers/Prognosis Comment -- CHL IP DIET RECOMMENDATION 04/11/2020 SLP Diet Recommendations Dysphagia 3 (Mech soft) solids;Thin liquid Liquid Administration via Cup;Straw Medication Administration Whole meds with liquid Compensations Minimize environmental distractions;Small sips/bites;Slow rate;Other (Comment)  Postural Changes --   CHL IP OTHER RECOMMENDATIONS 04/11/2020 Recommended Consults -- Oral Care Recommendations Oral care BID Other Recommendations --   CHL IP FOLLOW UP RECOMMENDATIONS 04/11/2020 Follow up Recommendations None   CHL IP FREQUENCY AND DURATION 04/11/2020 Speech Therapy Frequency (ACUTE ONLY) min 1 x/week Treatment Duration 1 week      CHL IP ORAL PHASE 04/11/2020 Oral Phase Impaired Oral - Pudding Teaspoon -- Oral - Pudding Cup -- Oral - Honey Teaspoon -- Oral - Honey Cup -- Oral - Nectar Teaspoon -- Oral - Nectar Cup -- Oral - Nectar Straw -- Oral - Thin Teaspoon -- Oral - Thin Cup -- Oral - Thin Straw -- Oral - Puree -- Oral - Mech Soft -- Oral - Regular Reduced posterior propulsion Oral - Multi-Consistency -- Oral - Pill -- Oral Phase - Comment --  CHL IP PHARYNGEAL PHASE 04/11/2020 Pharyngeal Phase Impaired Pharyngeal- Pudding Teaspoon -- Pharyngeal -- Pharyngeal- Pudding Cup -- Pharyngeal -- Pharyngeal- Honey Teaspoon -- Pharyngeal -- Pharyngeal- Honey Cup -- Pharyngeal -- Pharyngeal- Nectar Teaspoon -- Pharyngeal  -- Pharyngeal- Nectar Cup -- Pharyngeal -- Pharyngeal- Nectar Straw -- Pharyngeal -- Pharyngeal- Thin Teaspoon -- Pharyngeal -- Pharyngeal- Thin Cup Delayed swallow initiation-vallecula;Penetration/Aspiration during swallow Pharyngeal Material enters airway, remains ABOVE vocal cords then ejected out Pharyngeal- Thin Straw Delayed swallow initiation-vallecula;Penetration/Aspiration during swallow Pharyngeal Material enters airway, remains ABOVE vocal cords then ejected out Pharyngeal- Puree WFL Pharyngeal -- Pharyngeal- Mechanical Soft -- Pharyngeal -- Pharyngeal- Regular WFL Pharyngeal -- Pharyngeal- Multi-consistency Penetration/Aspiration during swallow Pharyngeal Material enters airway, remains ABOVE vocal cords then ejected out Pharyngeal- Pill WFL Pharyngeal -- Pharyngeal Comment --  CHL IP CERVICAL ESOPHAGEAL PHASE 04/11/2020 Cervical Esophageal Phase (No Data) Pudding Teaspoon -- Pudding Cup -- Honey Teaspoon -- Honey Cup -- Nectar Teaspoon -- Nectar Cup -- Nectar Straw -- Thin Teaspoon -- Thin Cup -- Thin Straw -- Puree -- Mechanical Soft -- Regular -- Multi-consistency -- Pill -- Cervical Esophageal Comment -- Sonia Baller, MA, CCC-SLP Speech Therapy             ECHOCARDIOGRAM COMPLETE  Result Date: 04/07/2020    ECHOCARDIOGRAM REPORT   Patient Name:   [name] Date of Exam: 04/07/2020 Medical Rec #:  [id number]         Height:       66.0 in Accession #:    [id number]        Weight:       133.2 lb Date of Birth:  [birth year]        BSA:          1.682 m Patient Age:    78 years          BP:           109/70 mmHg Patient Gender: F                 HR:           97 bpm. Exam Location:  Inpatient Procedure: 2D Echo, Cardiac Doppler, Color Doppler and Intracardiac            Opacification Agent Indications:    R07.89 Other chest pain. Elevated troponin.  History:        Patient has prior history of Echocardiogram examinations, most                 recent 11/28/2017. COPD, Signs/Symptoms:Chest  Pain, Shortness of  Breath and Dyspnea; Risk Factors:Former Smoker and Dyslipidemia.                 Elevated troponin. Hypoxia. Lung cancer. ETOH.  Sonographer:    Roseanna Rainbow RDCS Referring Phys: [place]  Sonographer Comments: Technically challenging study due to limited acoustic windows, Technically difficult study due to poor echo windows, suboptimal parasternal window, suboptimal apical window and suboptimal subcostal window. Image acquisition challenging due to COPD and Image acquisition challenging due to respiratory motion. Patient having trouble breathing throughout exam. Extremely difficult and limited study. IMPRESSIONS  1. Left ventricular ejection fraction, by estimation, is 45 to 50%. The left ventricle has mildly decreased function. The left ventricle demonstrates global hypokinesis. Left ventricular diastolic parameters are consistent with Grade I diastolic dysfunction (impaired relaxation).  2. Right ventricular systolic function was not well visualized. The right ventricular size is not well visualized.  3. The mitral valve was not well visualized. No evidence of mitral valve regurgitation.  4. The aortic valve was not well visualized. Aortic valve regurgitation is not visualized.  5. The inferior vena cava is dilated in size with <50% respiratory variability, suggesting right atrial pressure of 15 mmHg. Comparison(s): No prior Echocardiogram. Conclusion(s)/Recommendation(s): Difficult study even with echo constrast. FINDINGS  Left Ventricle: Left ventricular ejection fraction, by estimation, is 45 to 50%. The left ventricle has mildly decreased function. The left ventricle demonstrates global hypokinesis. Definity contrast agent was given IV to delineate the left ventricular  endocardial borders. The left ventricular internal cavity size was normal in size. There is no left ventricular hypertrophy. Left ventricular diastolic parameters are consistent with Grade I  diastolic dysfunction (impaired relaxation). Right Ventricle: The right ventricular size is not well visualized. Right vetricular wall thickness was not well visualized. Right ventricular systolic function was not well visualized. Left Atrium: Left atrial size was normal in size. Right Atrium: Right atrial size was not well visualized. Pericardium: There is no evidence of pericardial effusion. Mitral Valve: The mitral valve was not well visualized. No evidence of mitral valve regurgitation. Tricuspid Valve: The tricuspid valve is not well visualized. Tricuspid valve regurgitation is not demonstrated. Aortic Valve: The aortic valve was not well visualized. Aortic valve regurgitation is not visualized. Pulmonic Valve: The pulmonic valve was not well visualized. Pulmonic valve regurgitation is not visualized. Aorta: The aortic root is normal in size and structure and the ascending aorta was not well visualized. Venous: The pulmonary veins were not well visualized. The inferior vena cava is dilated in size with less than 50% respiratory variability, suggesting right atrial pressure of 15 mmHg. IAS/Shunts: The interatrial septum was not well visualized.  LEFT VENTRICLE PLAX 2D LVIDd:         4.40 cm     Diastology LVIDs:         3.50 cm     LV e' medial:    5.55 cm/s LV PW:         0.90 cm     LV E/e' medial:  13.1 LV IVS:        1.10 cm     LV e' lateral:   9.57 cm/s LVOT diam:     1.70 cm     LV E/e' lateral: 7.6 LV SV:         23 LV SV Index:   14 LVOT Area:     2.27 cm  LV Volumes (MOD) LV vol d, MOD A2C: 63.9 ml LV vol d, MOD A4C: 91.8  ml LV vol s, MOD A2C: 30.3 ml LV vol s, MOD A4C: 49.8 ml LV SV MOD A2C:     33.7 ml LV SV MOD A4C:     91.8 ml LV SV MOD BP:      38.6 ml IVC IVC diam: 2.40 cm LEFT ATRIUM           Index LA diam:      3.10 cm 1.84 cm/m LA Vol (A4C): 33.6 ml 19.97 ml/m  AORTIC VALVE LVOT Vmax:   56.40 cm/s LVOT Vmean:  35.800 cm/s LVOT VTI:    0.103 m  AORTA Ao Root diam: 3.20 cm MITRAL VALVE MV  Area (PHT): 4.06 cm    SHUNTS MV Decel Time: 187 msec    Systemic VTI:  0.10 m MV E velocity: 72.80 cm/s  Systemic Diam: 1.70 cm MV A velocity: 73.70 cm/s MV E/A ratio:  0.99 Rudean Haskell MD Electronically signed by Rudean Haskell MD Signature Date/Time: 04/07/2020/2:46:36 PM    Final    [place] EKG SITE RITE  Result Date: 04/21/2020 If Site Rite image not attached, placement could not be confirmed due to current cardiac rhythm.   Microbiology: Recent Results (from the past 240 hour(s))  SARS Coronavirus 2 by RT PCR (hospital order, performed in [facility] hospital lab) Nasopharyngeal Nasopharyngeal Swab     Status: None   Collection Time: 04/16/20 11:00 PM   Specimen: Nasopharyngeal Swab  Result Value Ref Range Status   SARS Coronavirus 2 NEGATIVE NEGATIVE Final    Comment: (NOTE) SARS-CoV-2 target nucleic acids are NOT DETECTED.  The SARS-CoV-2 RNA is generally detectable in upper and lower respiratory specimens during the acute phase of infection. The lowest concentration of SARS-CoV-2 viral copies this assay can detect is 250 copies / mL. A negative result does not preclude SARS-CoV-2 infection and should not be used as the sole basis for treatment or other patient management decisions.  A negative result may occur with improper specimen collection / handling, submission of specimen other than nasopharyngeal swab, presence of viral mutation(s) within the areas targeted by this assay, and inadequate number of viral copies (<250 copies / mL). A negative result must be combined with clinical observations, patient history, and epidemiological information.  Fact Sheet for Patients:   StrictlyIdeas.no  Fact Sheet for Healthcare Providers: BankingDealers.co.za  This test is not yet approved or  cleared by the [place] FDA and has been authorized for detection and/or diagnosis of SARS-CoV-2 by FDA under an Emergency Use  Authorization (EUA).  This EUA will remain in effect (meaning this test can be used) for the duration of the COVID-19 declaration under Section 564(b)(1) of the Act, 21 U.S.C. section 360bbb-3(b)(1), unless the authorization is terminated or revoked sooner.  Performed at [facility], [place] [address], [place], [place] [address]   Blood Culture (routine x 2)     Status: None   Collection Time: 05/17/2020  7:48 AM   Specimen: BLOOD  Result Value Ref Range Status   Specimen Description   Final    BLOOD RIGHT ANTECUBITAL Performed at [place] [address], [place], [place] [address]    Special Requests   Final    BOTTLES DRAWN AEROBIC AND ANAEROBIC Blood Culture results may not be optimal due to an excessive volume of blood received in culture bottles Performed at [place] [address] [place], [place] [address]    Culture   Final    NO  GROWTH 5 DAYS Performed at [facility], [place] [address], [place], [place] [address]    Report Status 28-Apr-2020 FINAL  Final  Respiratory Panel by RT PCR (Flu A&B, Covid) - Nasopharyngeal Swab     Status: None   Collection Time: 04/28/2020  8:50 AM   Specimen: Nasopharyngeal Swab  Result Value Ref Range Status   SARS Coronavirus 2 by RT PCR NEGATIVE NEGATIVE Final    Comment: (NOTE) SARS-CoV-2 target nucleic acids are NOT DETECTED.  The SARS-CoV-2 RNA is generally detectable in upper respiratoy specimens during the acute phase of infection. The lowest concentration of SARS-CoV-2 viral copies this assay can detect is 131 copies/mL. A negative result does not preclude SARS-Cov-2 infection and should not be used as the sole basis for treatment or other patient management decisions. A negative result may occur with  improper specimen collection/handling, submission of specimen other than nasopharyngeal swab, presence of viral mutation(s) within the areas targeted by this assay,  and inadequate number of viral copies (<131 copies/mL). A negative result must be combined with clinical observations, patient history, and epidemiological information. The expected result is Negative.  Fact Sheet for Patients:  PinkCheek.be  Fact Sheet for Healthcare Providers:  GravelBags.it  This test is no t yet approved or cleared by the [place] FDA and  has been authorized for detection and/or diagnosis of SARS-CoV-2 by FDA under an Emergency Use Authorization (EUA). This EUA will remain  in effect (meaning this test can be used) for the duration of the COVID-19 declaration under Section 564(b)(1) of the Act, 21 U.S.C. section 360bbb-3(b)(1), unless the authorization is terminated or revoked sooner.     Influenza A by PCR NEGATIVE NEGATIVE Final   Influenza B by PCR NEGATIVE NEGATIVE Final    Comment: (NOTE) The Xpert Xpress SARS-CoV-2/FLU/RSV assay is intended as an aid in  the diagnosis of influenza from Nasopharyngeal swab specimens and  should not be used as a sole basis for treatment. Nasal washings and  aspirates are unacceptable for Xpert Xpress SARS-CoV-2/FLU/RSV  testing.  Fact Sheet for Patients: PinkCheek.be  Fact Sheet for Healthcare Providers: GravelBags.it  This test is not yet approved or cleared by the [place] FDA and  has been authorized for detection and/or diagnosis of SARS-CoV-2 by  FDA under an Emergency Use Authorization (EUA). This EUA will remain  in effect (meaning this test can be used) for the duration of the  Covid-19 declaration under Section 564(b)(1) of the Act, 21  U.S.C. section 360bbb-3(b)(1), unless the authorization is  terminated or revoked. Performed at [facility], [place] [address] [place], [place] [address]   Group A Strep by PCR     Status: None   Collection Time: 04/23/2020  8:58 AM    Result Value Ref Range Status   Group A Strep by PCR NOT DETECTED NOT DETECTED Final    Comment: Performed at [facility], [place] [address] [place], [place] [address]  Blood Culture (routine x 2)     Status: None   Collection Time: 05/17/2020  9:17 AM   Specimen: BLOOD  Result Value Ref Range Status   Specimen Description   Final    BLOOD RIGHT ANTECUBITAL Performed at [facility] [address], [place], [place] [address]    Special Requests   Final    BOTTLES DRAWN AEROBIC AND ANAEROBIC Blood Culture results may not be optimal due to an excessive volume of blood received in culture  bottles Performed at [facility] [address], [place], [place] [address]    Culture   Final    NO GROWTH 5 DAYS Performed at [facility], [place] [address] [place], [place] [address]    Report Status 05-09-2020 FINAL  Final  Urine culture     Status: None   Collection Time: 04/30/2020 12:37 PM   Specimen: In/Out Cath Urine  Result Value Ref Range Status   Specimen Description   Final    IN/OUT CATH URINE Performed at [facility] [address], [place], [place] [address]    Special Requests   Final    NONE Performed at [facility], [place] [address] [place], [place] [address]    Culture   Final    NO GROWTH Performed at [facility], [place] [address], [place], [place] [address]    Report Status 04/21/2020 FINAL  Final  MRSA PCR Screening     Status: None   Collection Time: 05/07/2020  5:45 PM   Specimen: Nasal Mucosa; Nasopharyngeal  Result Value Ref Range Status   MRSA by PCR NEGATIVE NEGATIVE Final    Comment:        The GeneXpert MRSA Assay (FDA approved for NASAL specimens only), is one component of a comprehensive MRSA colonization surveillance program. It is not intended to diagnose MRSA infection nor to guide or monitor treatment for MRSA infections. Performed at [facility], [place] [address], [place], [place] [address]   Respiratory Panel by PCR     Status: None   Collection Time: 04/21/2020  9:20 PM  Result Value Ref Range Status   Adenovirus NOT DETECTED NOT DETECTED Final   Coronavirus 229E NOT DETECTED NOT DETECTED Final    Comment: (NOTE) The Coronavirus on the Respiratory Panel, DOES NOT test for the novel  Coronavirus (2019 nCoV)    Coronavirus HKU1 NOT DETECTED NOT DETECTED Final   Coronavirus NL63 NOT DETECTED NOT DETECTED Final   Coronavirus OC43 NOT DETECTED NOT DETECTED Final   Metapneumovirus NOT DETECTED NOT DETECTED Final   Rhinovirus / Enterovirus NOT DETECTED NOT DETECTED Final   Influenza A NOT DETECTED NOT DETECTED Final   Influenza B NOT DETECTED NOT DETECTED Final   Parainfluenza Virus 1 NOT DETECTED NOT DETECTED Final   Parainfluenza Virus 2 NOT DETECTED NOT DETECTED Final   Parainfluenza Virus 3 NOT DETECTED NOT DETECTED Final   Parainfluenza Virus 4 NOT DETECTED NOT DETECTED Final   Respiratory Syncytial Virus NOT DETECTED NOT DETECTED Final   Bordetella pertussis NOT DETECTED NOT DETECTED Final   Chlamydophila pneumoniae NOT DETECTED NOT DETECTED Final   Mycoplasma pneumoniae NOT DETECTED NOT DETECTED Final    Comment: Performed at [facility], [place] [address] [place], [place] [address]     Labs: Basic Metabolic Panel: Recent Labs  Lab 04/21/20 0219 04/21/20 0219 04/21/20 2253 04/21/20 2253 04/22/20 0405 04/22/20 0405 04/23/20 0359 04/23/20 0359 04/24/20 0329 05-09-20 0402  NA 129*  --   --   --  131*  --  133*  --  135 133*  K 3.3*   < > 4.0   < > 4.0   < > 3.9   < > 2.9* 3.6  CL 89*  --   --   --  92*  --  95*  --  95* 92*  CO2 30  --   --   --  32  --  31  --  30 29  GLUCOSE 142*  --   --   --  159*  --  128*  --  106* 90  BUN 23  --   --   --  18  --  15  --  15 17  CREATININE 1.02*  --   --   --  0.92  --  0.81  --  0.80 0.87  CALCIUM 7.4*  --   --   --  8.4*  --  8.5*  --  8.5*  8.4*  MG 1.9   < > 2.0  --  2.0  --  2.1  --  2.0 1.9  PHOS 3.7  --  2.3*  --  2.2*  --   --   --   --   --    < > = values in this interval not displayed.   Liver Function Tests: Recent Labs  Lab 05/06/2020 0748 04/22/20 0405 04/23/20 0359 04/24/20 0329 2020-05-17 0402  AST 40 23 26 27  37  ALT 30 23 24 24 26   ALKPHOS 126 91 90 91 89  BILITOT 0.7 0.8 0.5 0.6 0.9  PROT 5.3* 5.3* 5.3* 5.3* 5.4*  ALBUMIN 2.2* 2.6* 2.5* 2.6* 2.7*   No results for input(s): LIPASE, AMYLASE in the last 168 hours. No results for input(s): AMMONIA in the last 168 hours. CBC: Recent Labs  Lab 05/15/2020 0748 05/16/2020 0748 04/21/20 0219 04/22/20 0405 04/23/20 0359 04/24/20 0329 05/17/20 0402  WBC 15.5*   < > 10.5 9.2 13.4* 14.4* 14.9*  NEUTROABS 12.6*  --   --  8.3* 11.8* 12.7* 13.2*  HGB 10.1*   < > 8.8* 8.1* 8.4* 9.1* 8.7*  HCT 31.2*   < > 27.3* 24.9* 26.5* 29.1* 28.2*  MCV 97.5   < > 97.2 96.9 98.5 99.0 101.1*  PLT 212   < > 195 247 280 293 248   < > = values in this interval not displayed.   Cardiac Enzymes: No results for input(s): CKTOTAL, CKMB, CKMBINDEX, TROPONINI in the last 168 hours. D-Dimer No results for input(s): DDIMER in the last 72 hours. BNP: Invalid input(s): POCBNP CBG: Recent Labs  Lab 04/22/20 0744 04/22/20 1125 04/22/20 1616 04/22/20 2045 05-17-2020 0257  GLUCAP 119* 131* 122* 150* 100*   Anemia work up No results for input(s): VITAMINB12, FOLATE, FERRITIN, TIBC, IRON, RETICCTPCT in the last 72 hours. Urinalysis    Component Value Date/Time   COLORURINE YELLOW 04/26/2020 1237   APPEARANCEUR HAZY (A) 04/23/2020 1237   LABSPEC 1.008 05/01/2020 1237   PHURINE 6.0 05/10/2020 1237   GLUCOSEU NEGATIVE 05/18/2020 1237   GLUCOSEU NEGATIVE 05/08/2018 1657   HGBUR NEGATIVE 05/13/2020 1237   HGBUR negative 07/30/2009 1254   BILIRUBINUR NEGATIVE 04/22/2020 1237   BILIRUBINUR neg 10/10/2012 1334   KETONESUR NEGATIVE 04/29/2020 1237   PROTEINUR NEGATIVE 05/08/2020 1237     UROBILINOGEN 0.2 05/08/2018 1657   NITRITE NEGATIVE 05/02/2020 1237   LEUKOCYTESUR NEGATIVE 05/13/2020 1237   Sepsis Labs Invalid input(s): PROCALCITONIN,  WBC,  LACTICIDVEN     SIGNED:  Aline August, MD  [facility] 05/17/2020, 1:45 PM

## 2020-05-20 NOTE — Progress Notes (Signed)
Wasted 70ml of remaining dilaudid drip into stericycle.  Waste witnessed by Joellen Jersey. Albaraa Swingle, Laurel Dimmer, RN

## 2020-05-20 DEATH — deceased

## 2020-09-15 ENCOUNTER — Ambulatory Visit: Payer: PPO | Admitting: Internal Medicine

## 2020-09-20 IMAGING — DX DG CHEST 2V
2 series · 2 of 2 positions shown · non-contrast
Comparison: 06/29/2017 and CT chest 02/28/2017.

CLINICAL DATA: Cough, history of asthma.

EXAM:
CHEST - 2 VIEW

[chest lat]
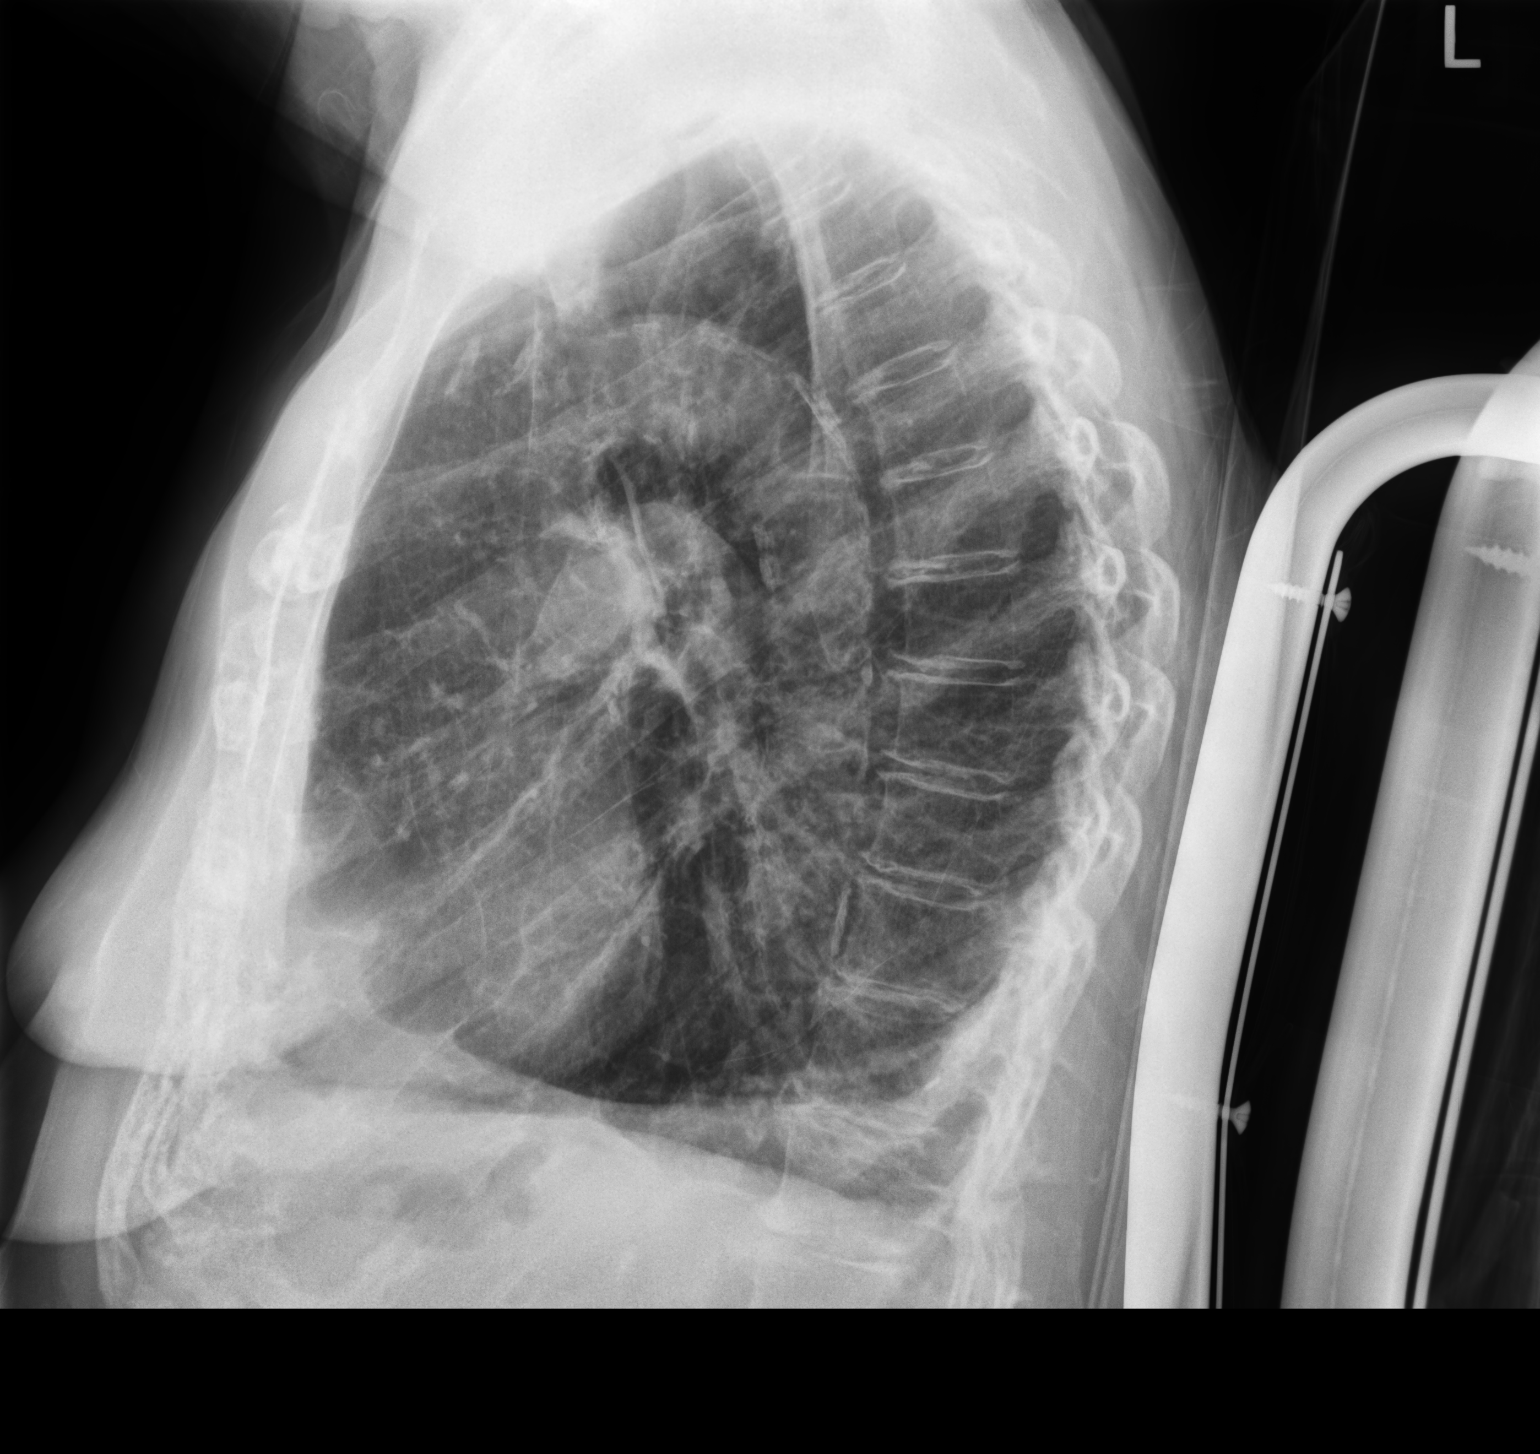

[chest ap]
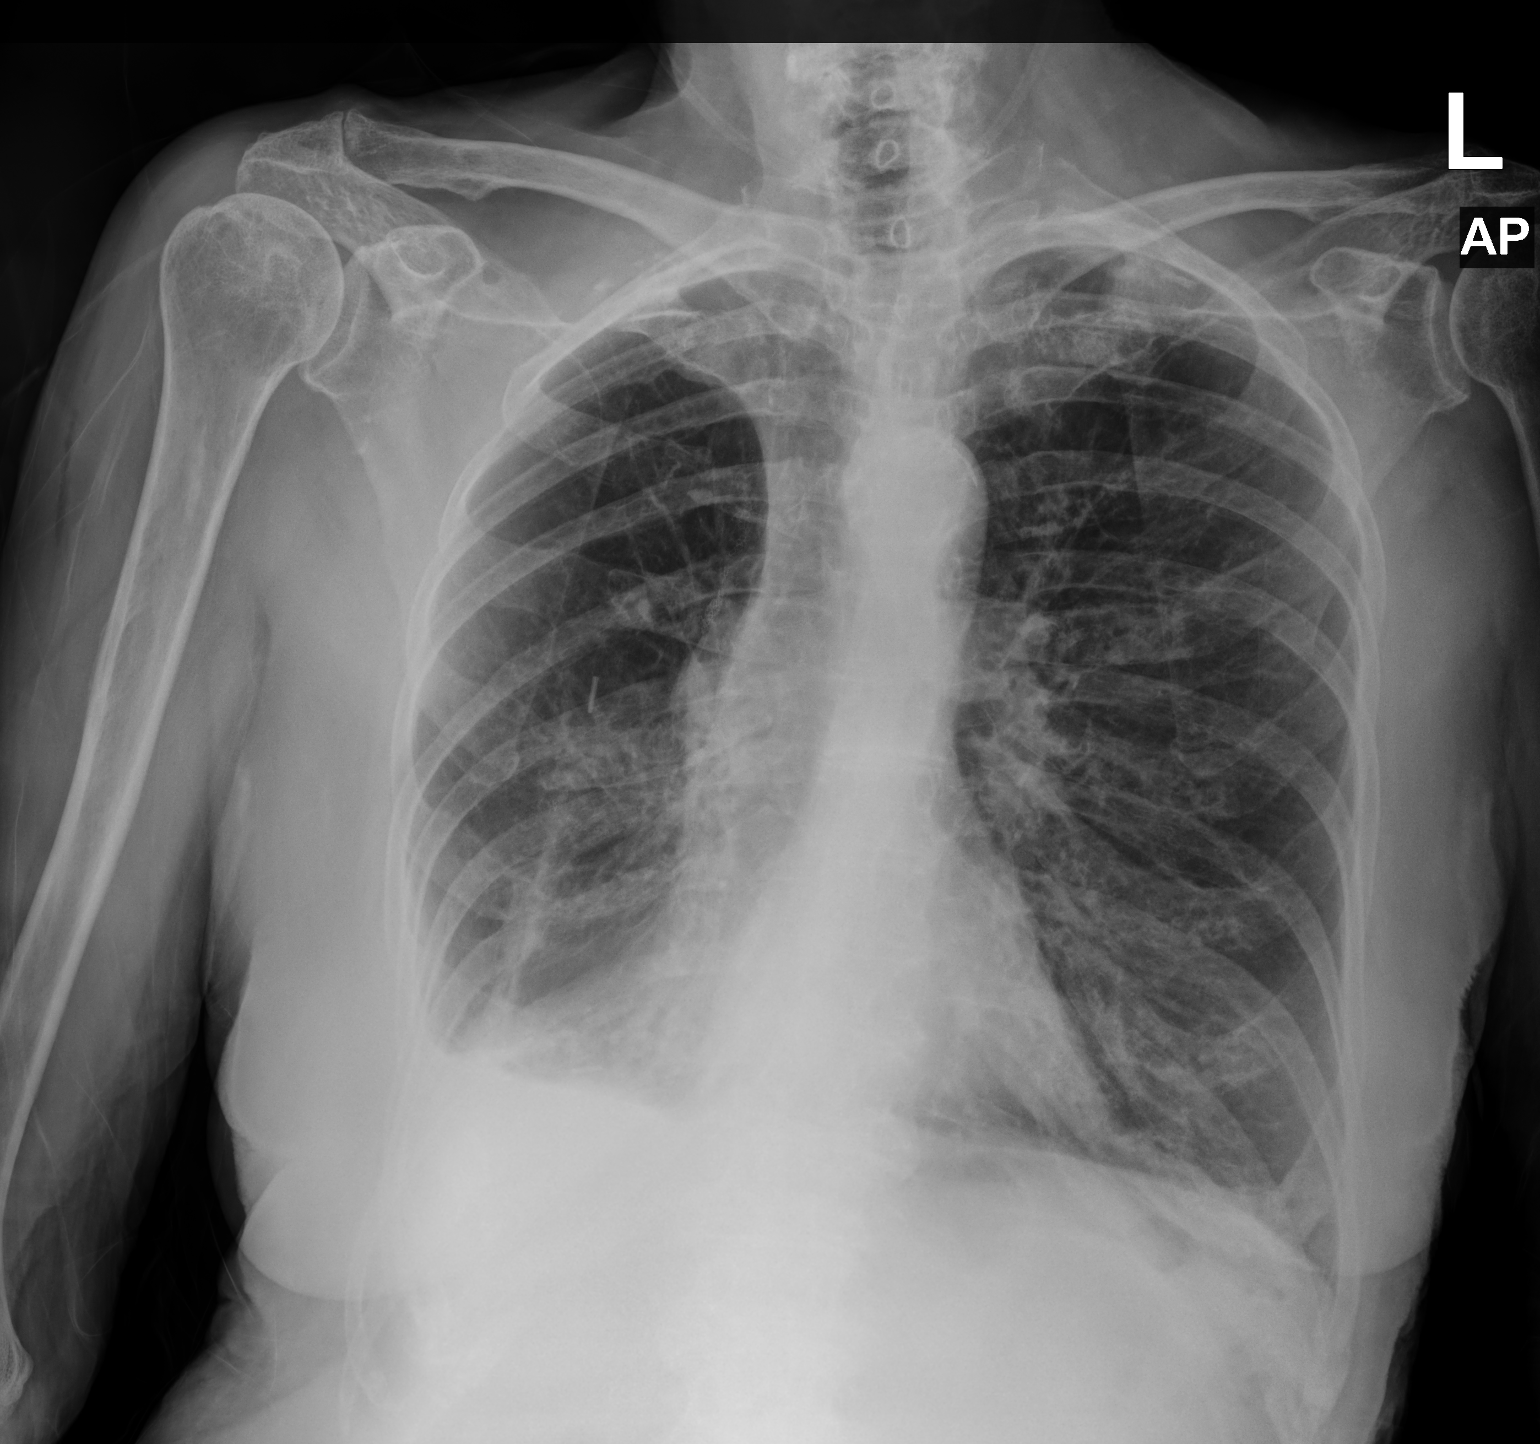

[2 of 2 positions shown; findings below may reference images not displayed]

FINDINGS: Trachea is deviated slightly to the right secondary to right upper
lobectomy. Heart size normal. Thoracic aorta is calcified. Lungs are
emphysematous. Pleuroparenchymal scarring at the base of the right
hemithorax. Lungs are otherwise clear. No pleural fluid.
IMPRESSION: 1. No acute findings.
2.  Aortic atherosclerosis (UBPSK-170.0).
3.  Emphysema (UBPSK-3LE.9).

## 2020-12-12 IMAGING — DX DG KNEE 1-2V*R*
2 series · 2 of 2 positions shown · non-contrast
Comparison: 02/16/2018.

CLINICAL DATA: Fall.

EXAM:
RIGHT KNEE - 1-2 VIEW

[knee ap]
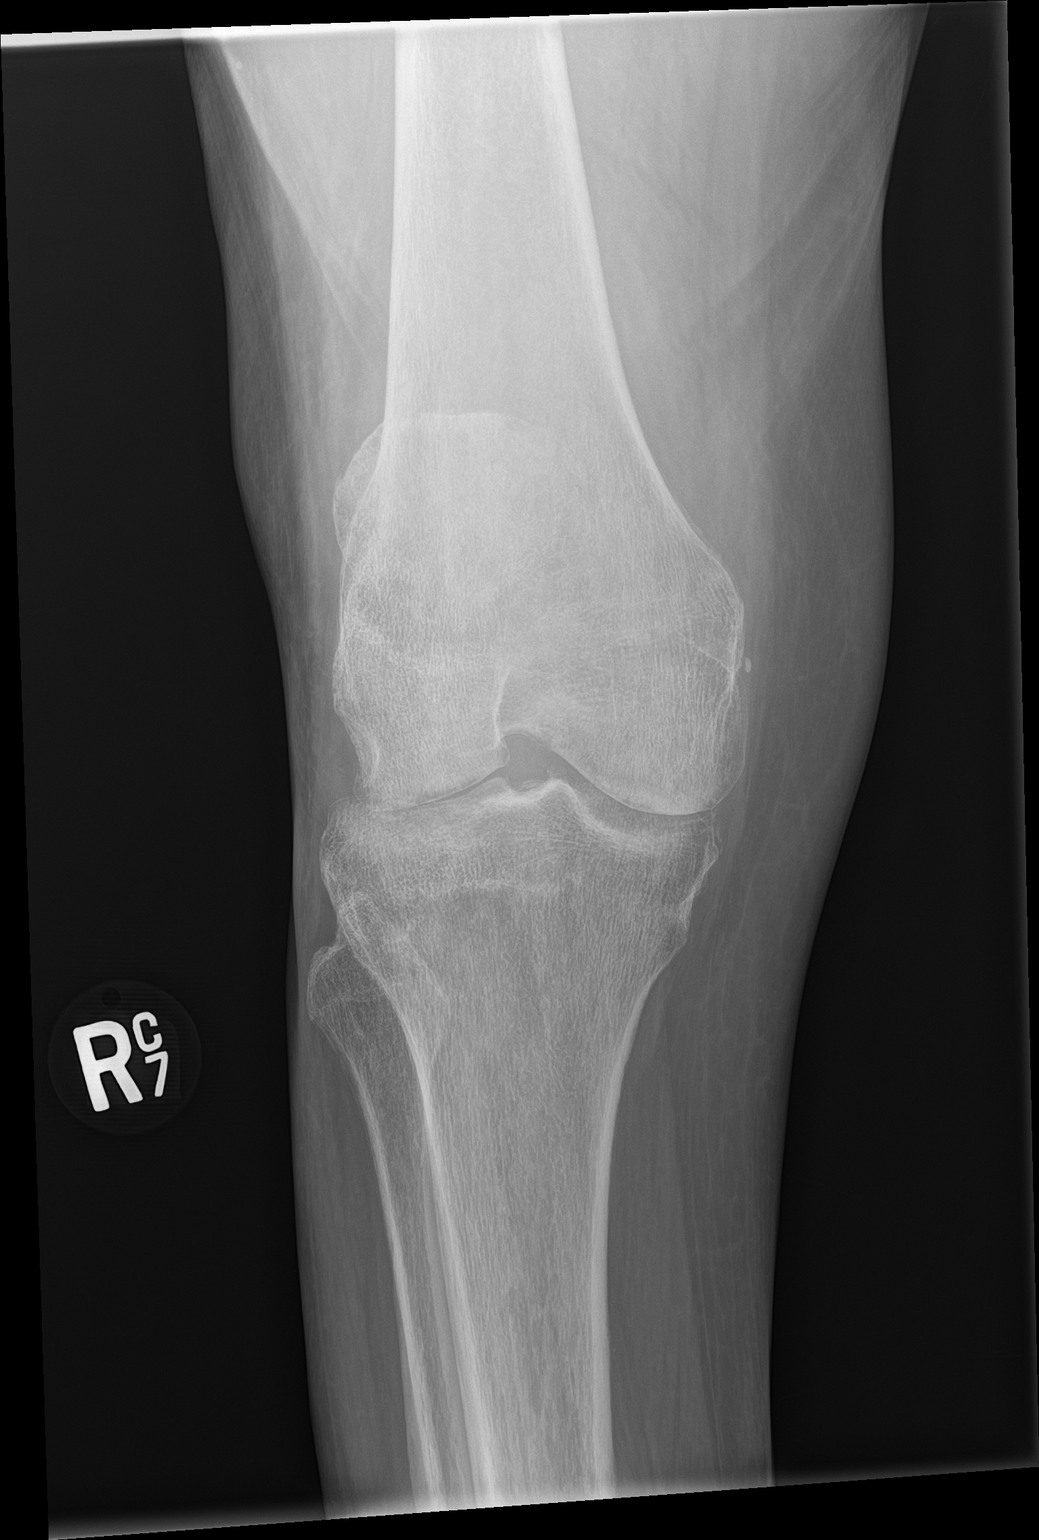

[knee lat]
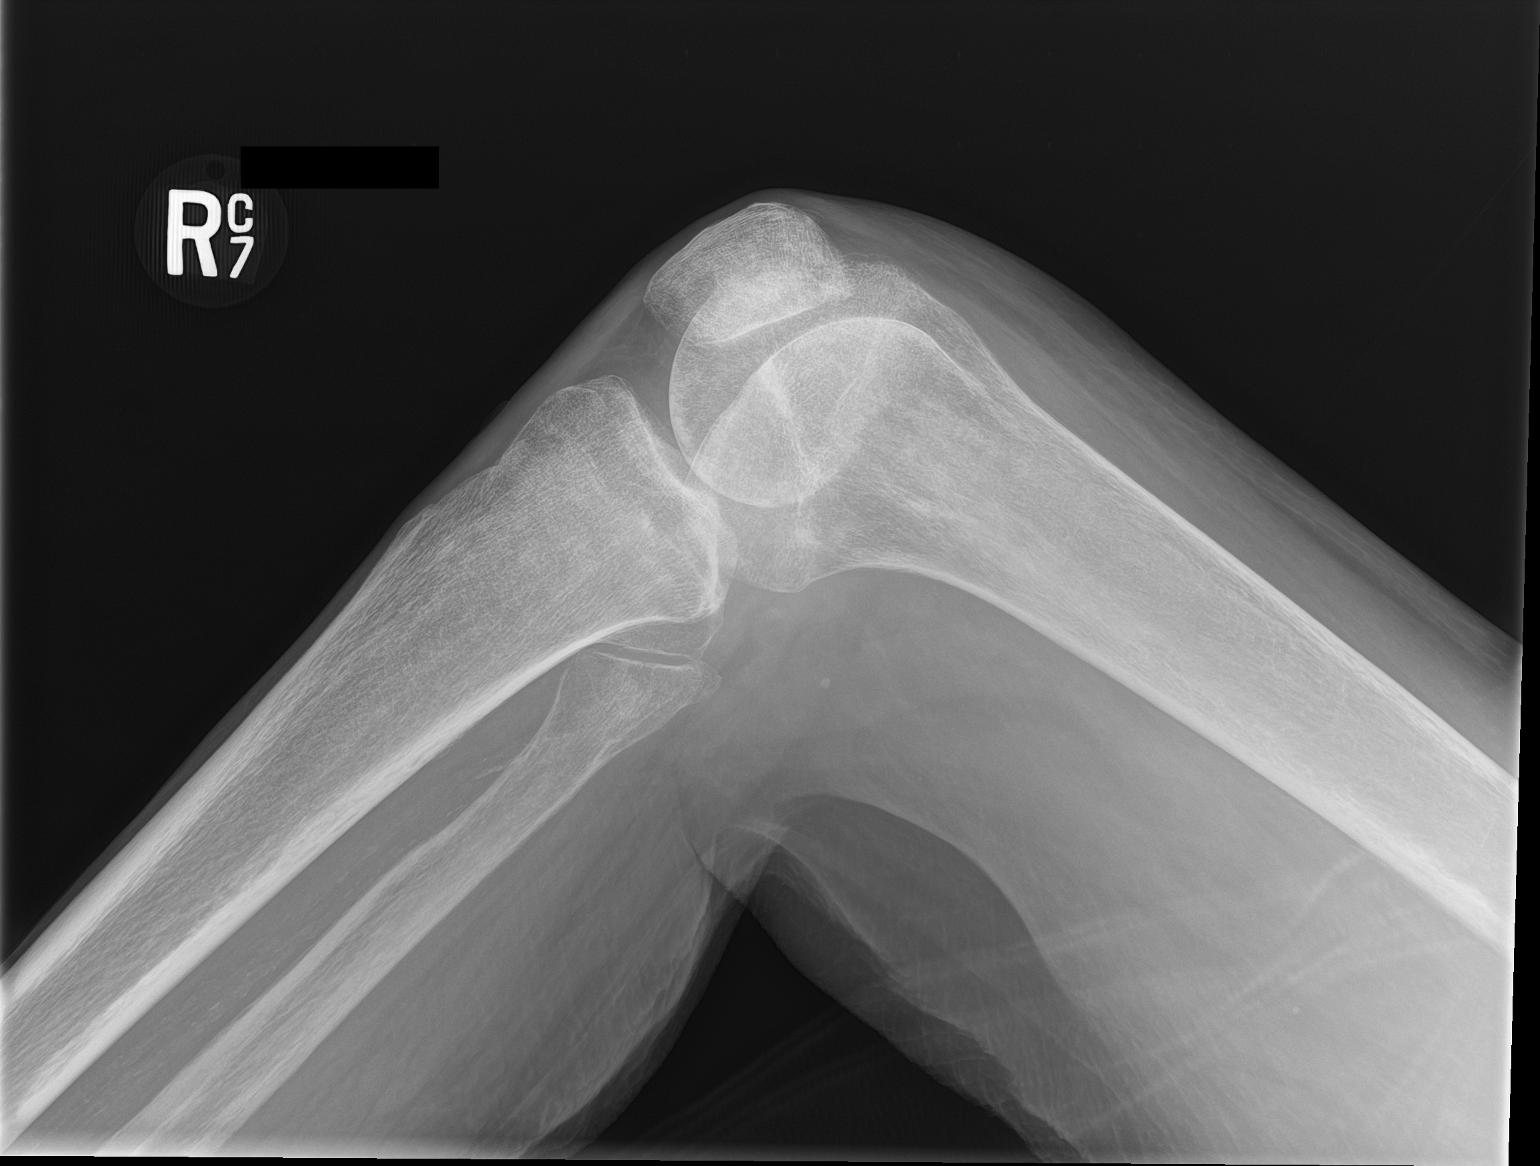

[2 of 2 positions shown; findings below may reference images not displayed]

FINDINGS: Tricompartment degenerative change. Degenerative changes most
prominent about the medial compartment. Punctate density noted
adjacent to the medial femoral condyle possibly related to old
ligamentous injury. No acute bony abnormality identified. No
evidence of fracture or dislocation. Peripheral vascular
calcification.
IMPRESSION: 1. Tricompartment degenerative change. Degenerative changes most
prominent about the medial compartment. No acute abnormality
identified.

2.  Peripheral vascular disease.

## 2020-12-12 IMAGING — DX DG CHEST 2V
2 series · 2 of 2 positions shown · non-contrast
Comparison: Chest radiograph 02/26/2019

CLINICAL DATA: Cough. Additional history provided: Chest pain and
shortness of breath for several days.

EXAM:
CHEST - 2 VIEW

[chest lat]
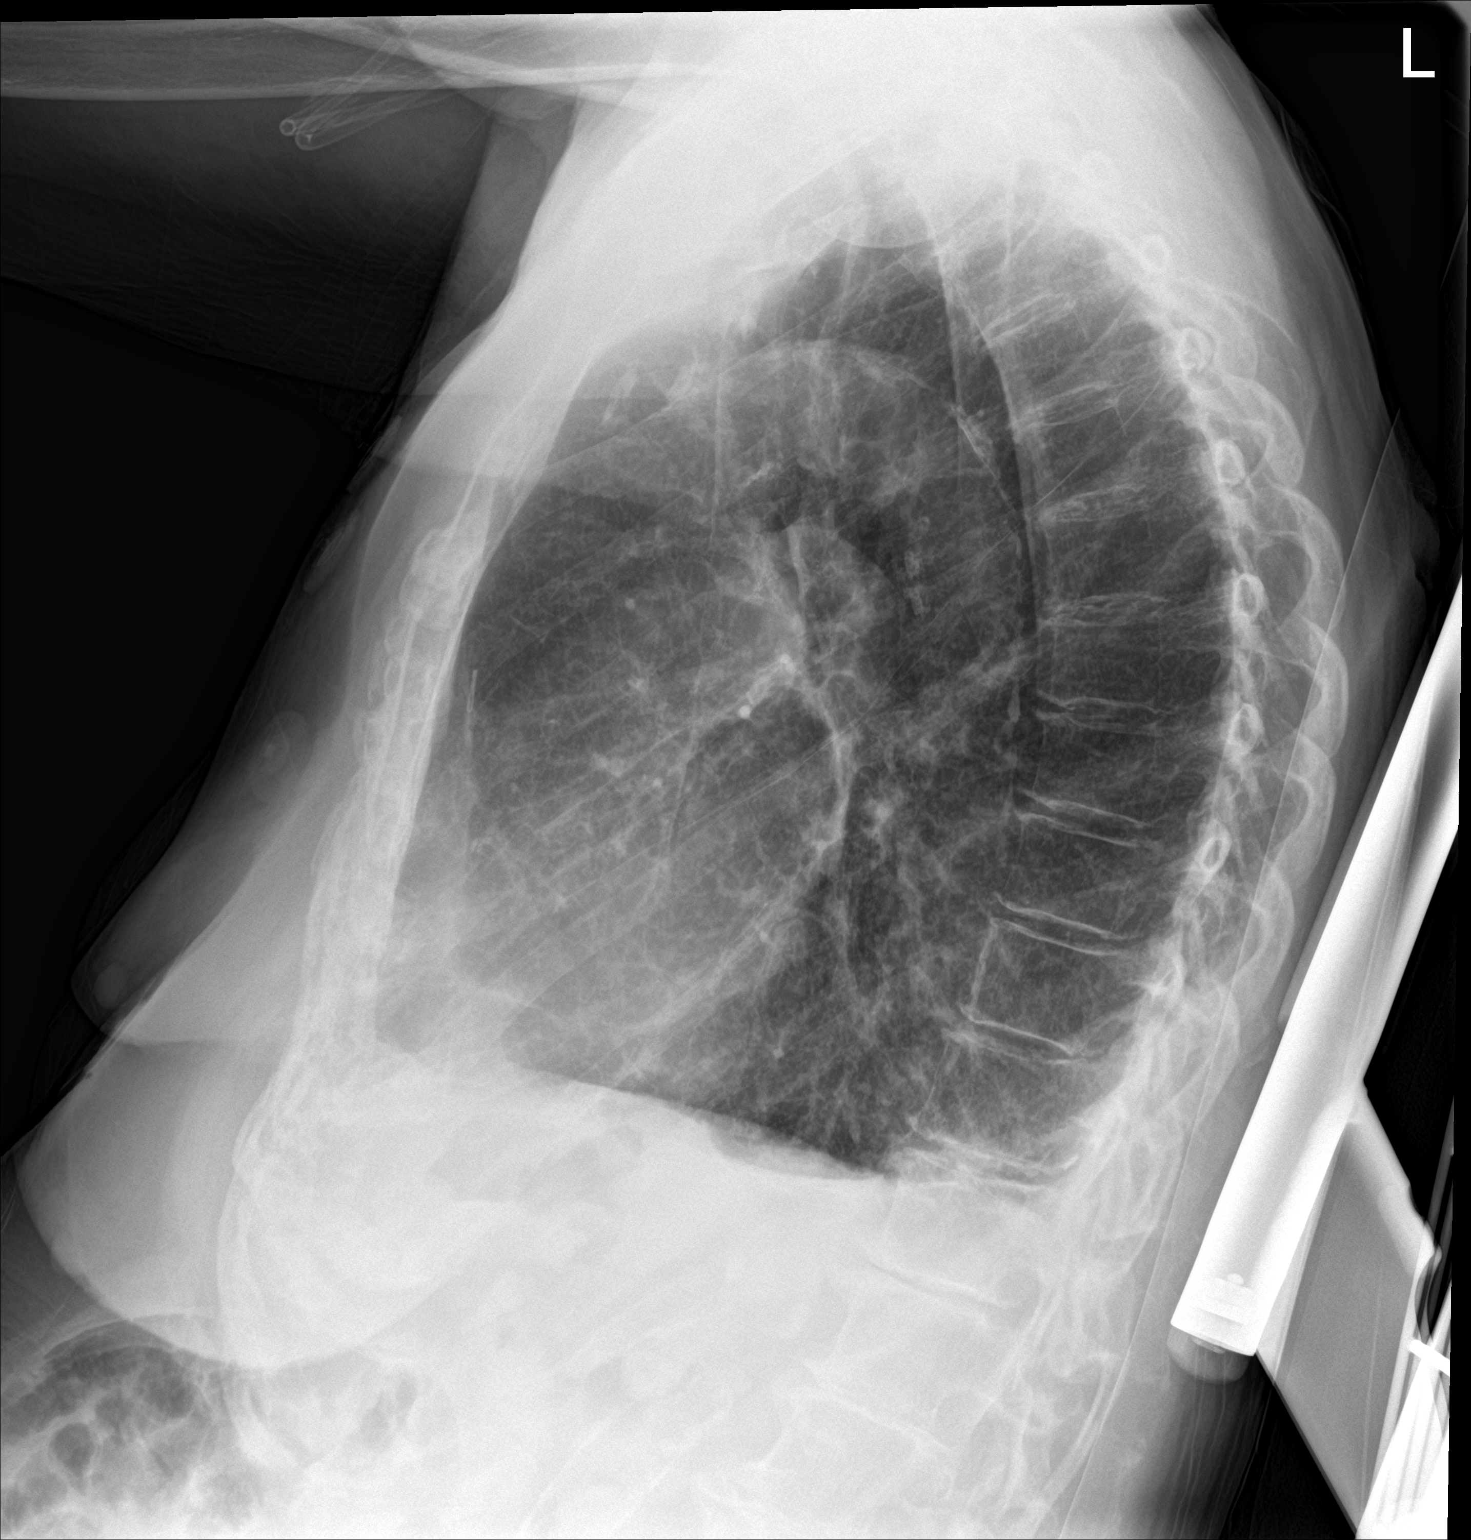

[chest ap]
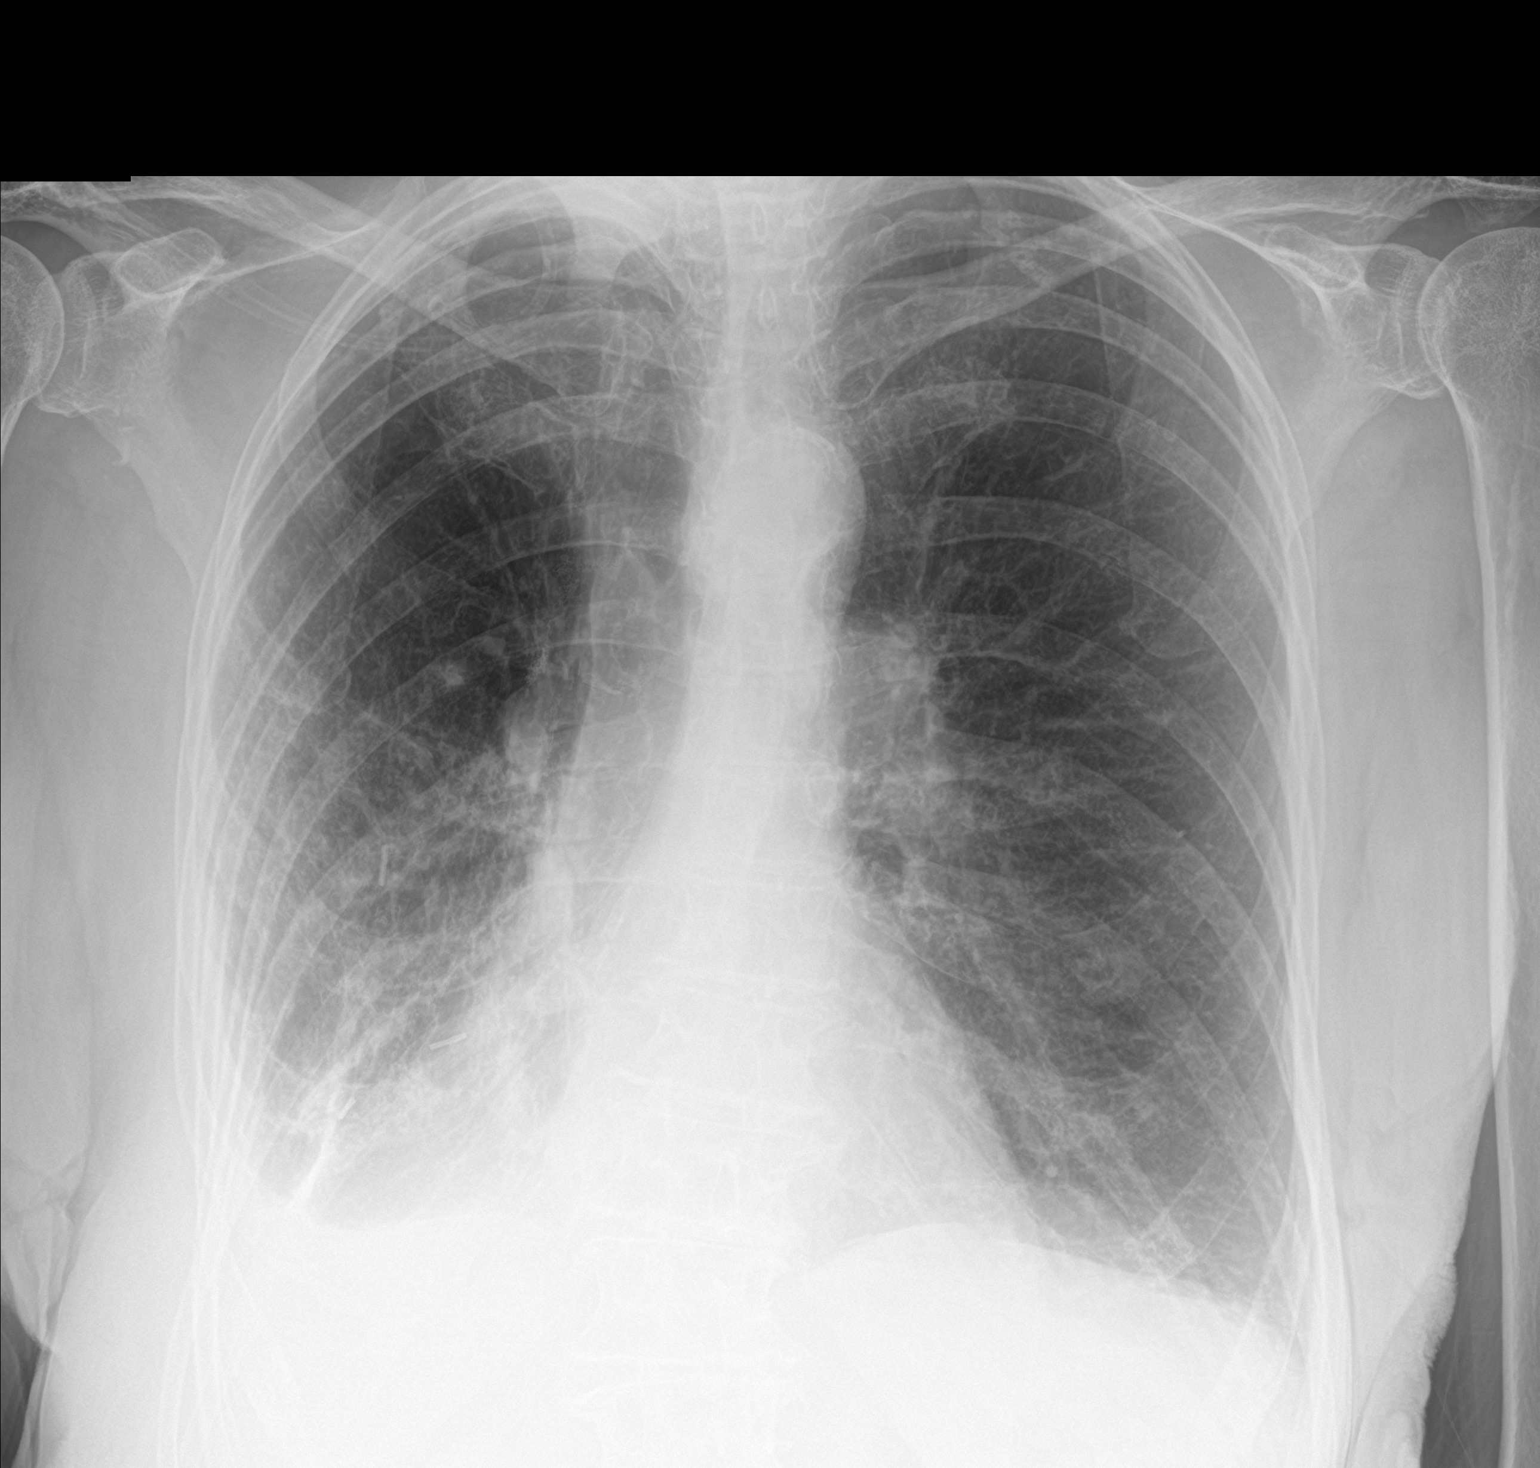

[2 of 2 positions shown; findings below may reference images not displayed]

FINDINGS: As before, there is rightward shift of mediastinal structures
secondary to previous right upper lobectomy.

Heart size within normal limits.  Aortic atherosclerosis.

Similar appearance of pleuroparenchymal scarring at the base of the
right hemithorax as compared to prior chest radiographs 02/26/2019.
No convincing superimposed airspace disease. The left lung is clear.
No evidence of pneumothorax or sizable pleural effusion. No acute
bony abnormality.
IMPRESSION: Similar appearance of pleuroparenchymal scarring at the base of the
right hemithorax as compared to prior examination 02/26/2019. No
convincing superimposed airspace disease. Left lung clear.

Sequela of prior right upper lobectomy.

Aortic atherosclerosis.

## 2020-12-12 IMAGING — DX DG PELVIS 1-2V
1 series · 1 of 1 positions shown · non-contrast
Comparison: May 28, 2018.

CLINICAL DATA: Fall.

EXAM:
PELVIS - 1-2 VIEW

[pelvis ap]
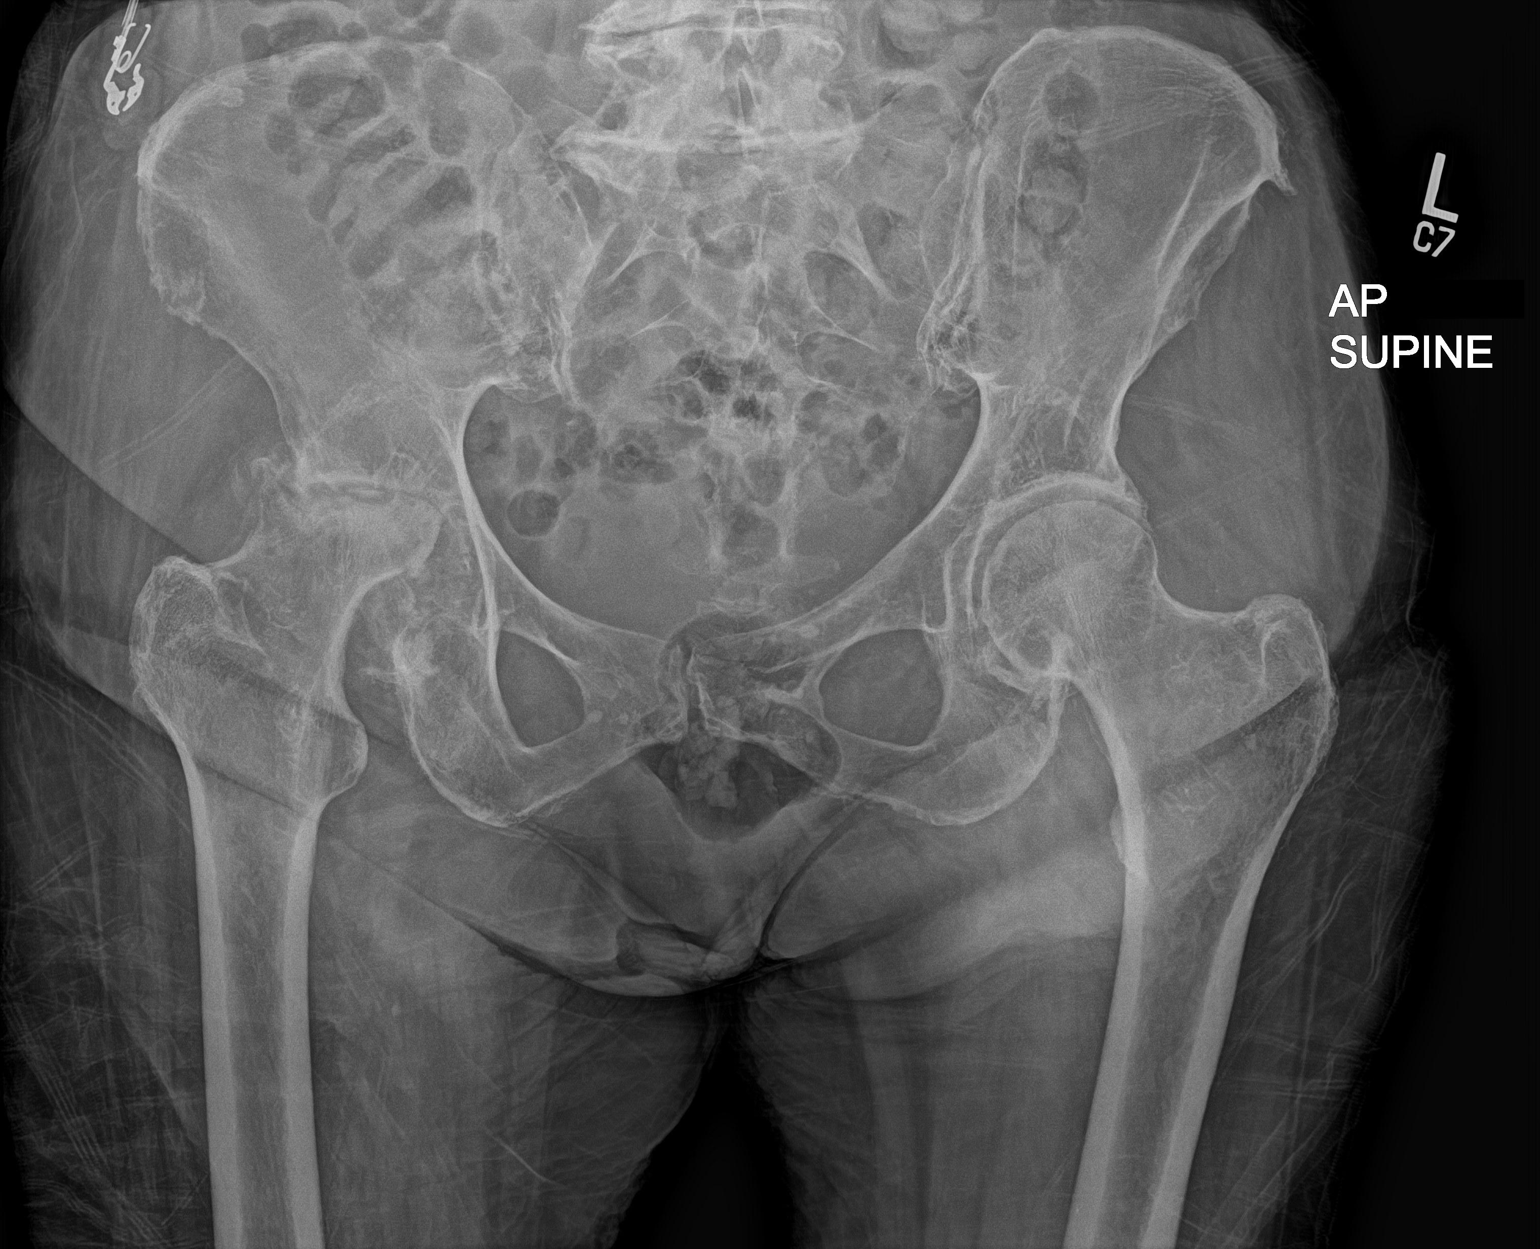

[1 of 1 positions shown; findings below may reference images not displayed]

FINDINGS: There appears to be severe deformity of the right femoral head
consistent with advanced avascular necrosis, which is significantly
worse compared to prior exam. No acute fracture or dislocation is
noted. There appears to be severe subchondral collapse of the
femoral head. Left hip appears normal. Sacroiliac joints are
unremarkable.
IMPRESSION: Severe deformity of the right femoral head is noted consistent with
advanced avascular necrosis resulting in subchondral collapse of the
femoral head, which is significantly worse compared to prior exam.
No acute fracture or dislocation is noted.

## 2021-03-21 IMAGING — DX DG CHEST 2V
2 series · 2 of 2 positions shown · non-contrast
Comparison: Chest x-ray 05/20/2019.

CLINICAL DATA: 86-year-old female with history of COPD
exacerbation.

EXAM:
CHEST - 2 VIEW

[chest pa]
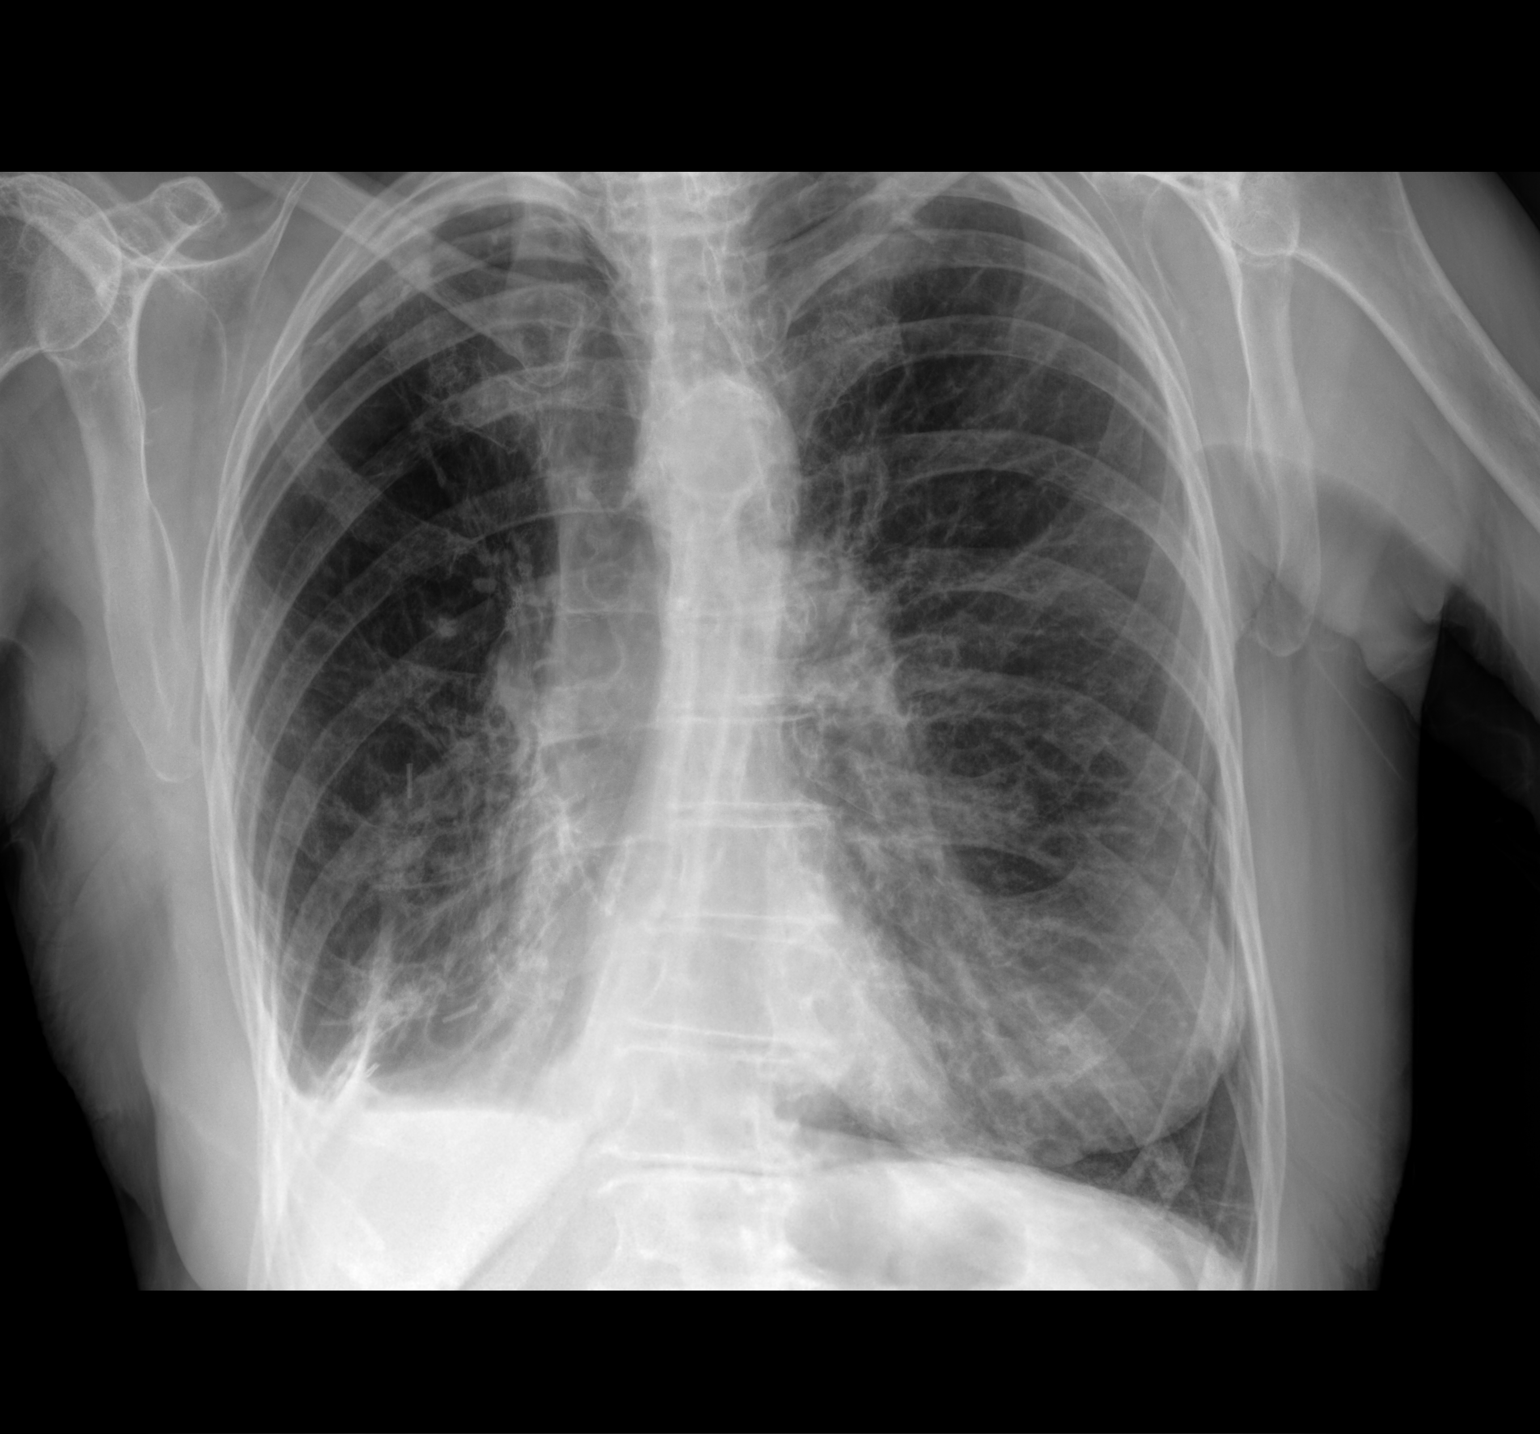

[chest lat]
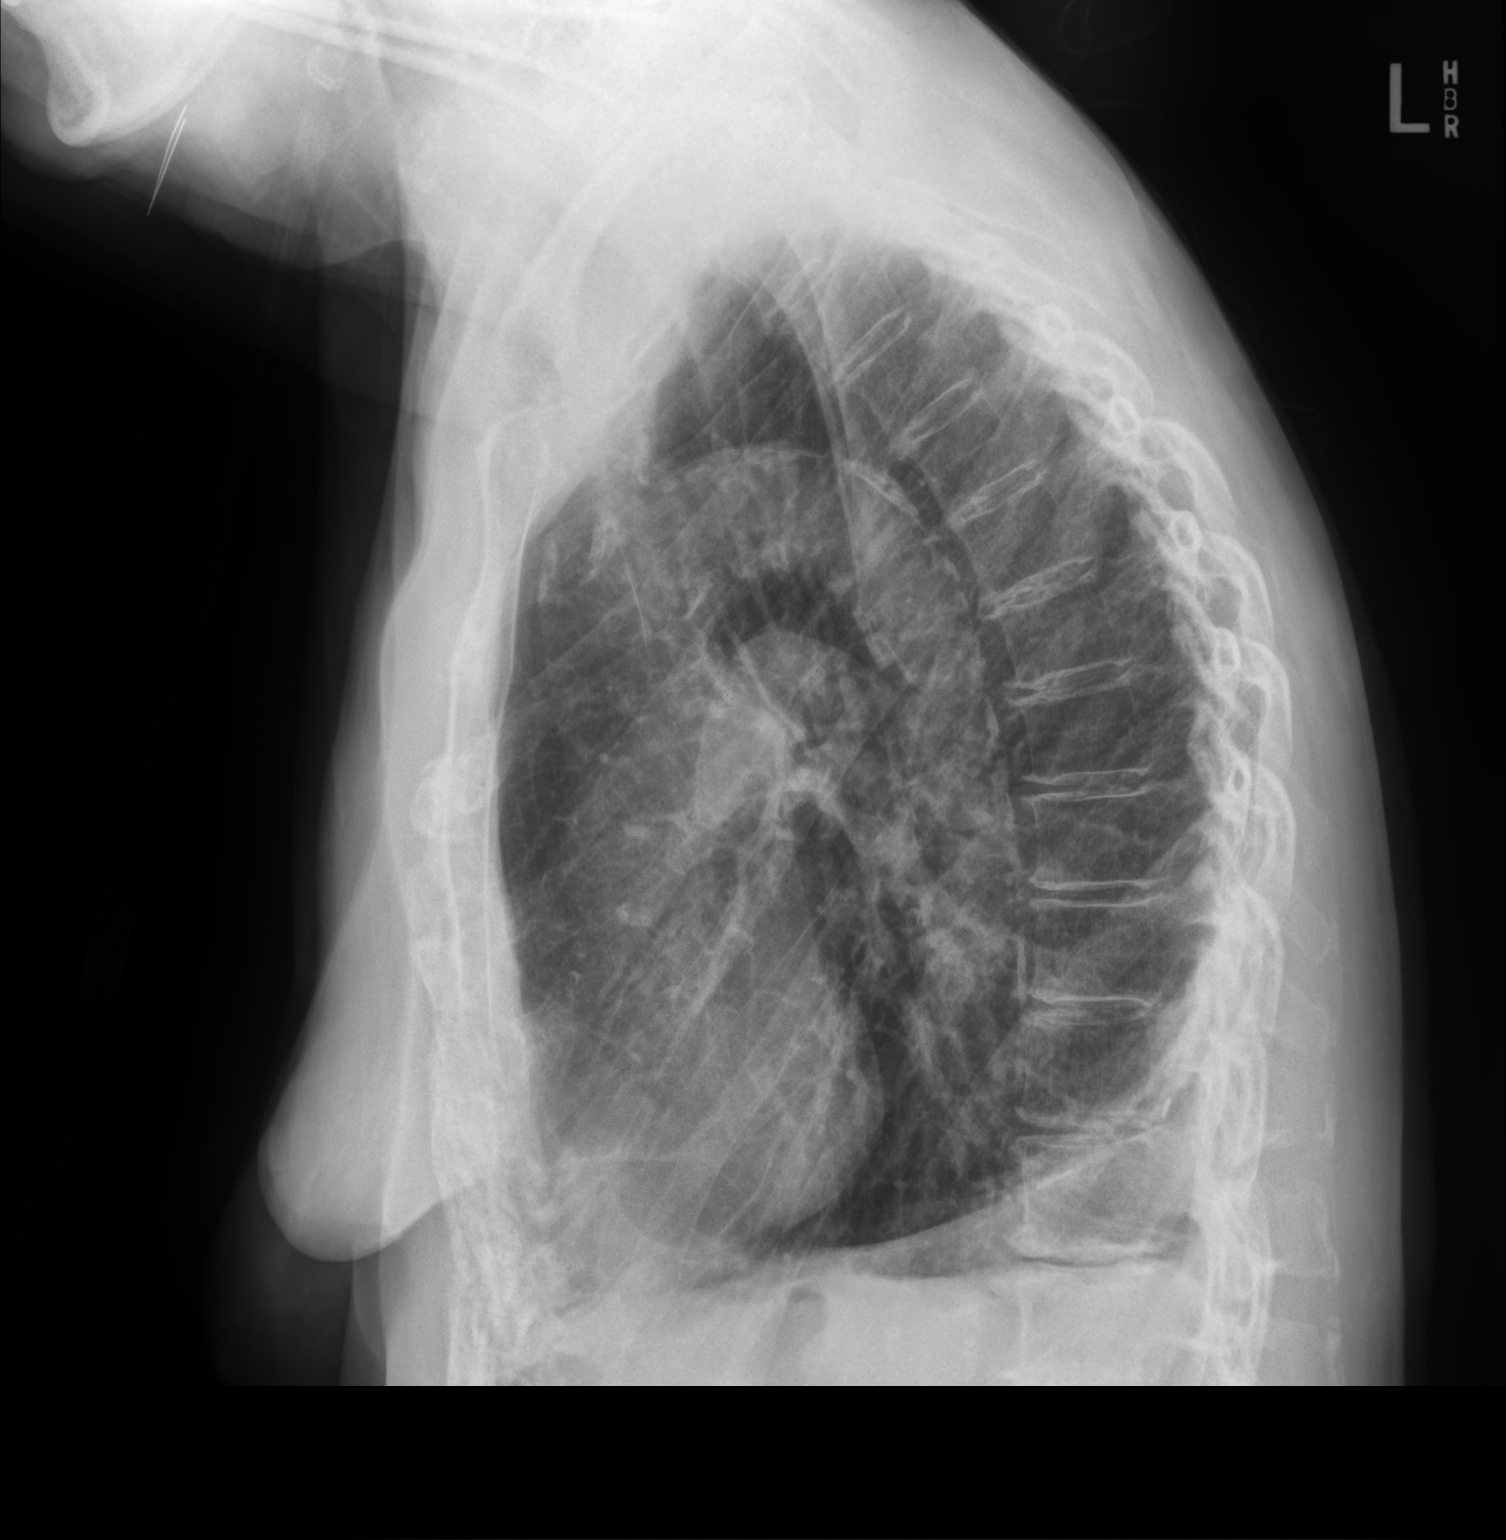

[2 of 2 positions shown; findings below may reference images not displayed]

FINDINGS: Small right pleural effusion. Postoperative changes of wedge
resection in the right lung base. Small calcified granuloma in the
right mid lung. Left lung is clear. No left pleural effusion. No
evidence of pulmonary edema. Heart size is normal. The patient is
rotated to the right on today's exam, resulting in distortion of the
mediastinal contours and reduced diagnostic sensitivity and
specificity for mediastinal pathology. Aortic atherosclerosis.
IMPRESSION: 1. Small right pleural effusion.
2. Aortic atherosclerosis.

## 2021-06-20 IMAGING — DX DG CHEST 1V PORT
1 series · 1 of 1 positions shown · non-contrast
Comparison: PA and lateral chest 08/27/2019 and 05/20/2019. CT
chest 02/28/2017.

CLINICAL DATA: Nausea and weakness for several days.

EXAM:
PORTABLE CHEST 1 VIEW

[chest ap]
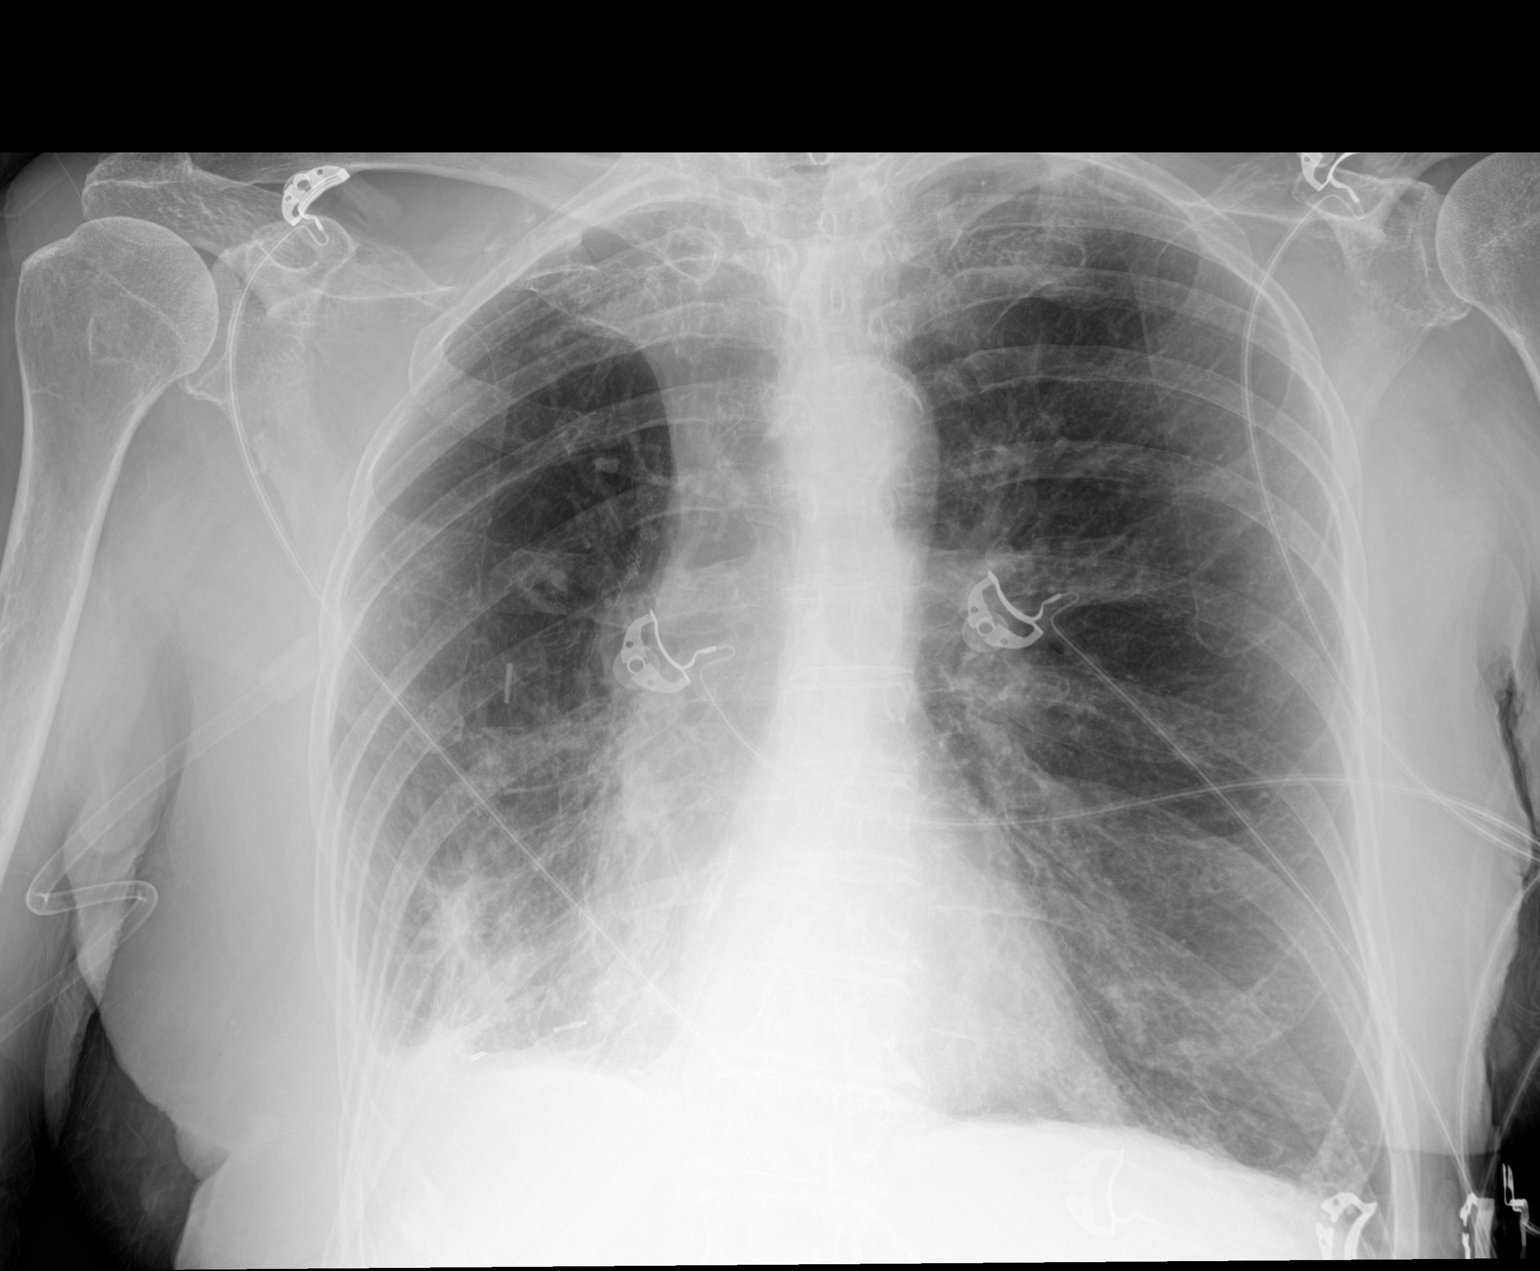

[1 of 1 positions shown; findings below may reference images not displayed]

FINDINGS: The lungs are severely emphysematous. Pleural and parenchymal
scarring in the right mid and lower lung zones is unchanged. Heart
size is normal. Atherosclerosis. No pneumothorax or pleural fluid.
IMPRESSION: No acute disease.

Aortic Atherosclerosis ([id number].X) and Emphysema ([id number].R).

## 2021-06-21 IMAGING — CT CT ABD-PELV W/ CM
2 of 8 series · 13 of 46 positions shown, 18 images · IV contrast (OMNIPAQUE 300)
Comparison: 05/23/2014

CLINICAL DATA: Nausea and vomiting

EXAM:
CT ABDOMEN AND PELVIS WITH CONTRAST
TECHNIQUE: Multidetector CT imaging of the abdomen and pelvis was performed
using the standard protocol following bolus administration of
intravenous contrast.
CONTRAST:  100mL OMNIPAQUE IOHEXOL 300 MG/ML  SOLN

[Series 2: axial st · axial · 0.70mm/px · z∈[+1168,+1494]mm · 10 of 77 slices shown, 15 images]
[im 6/77  soft-tissue]
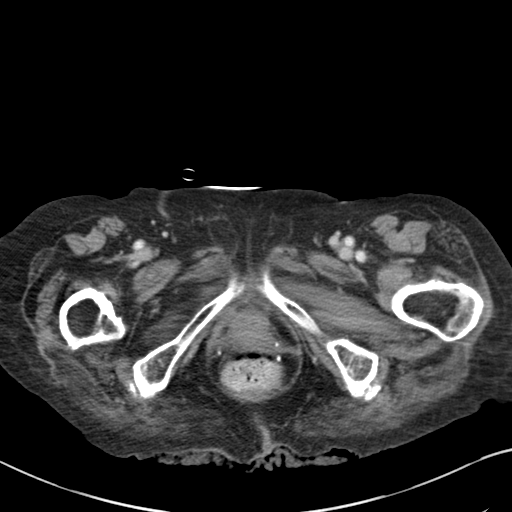
[im 6/77  bone]
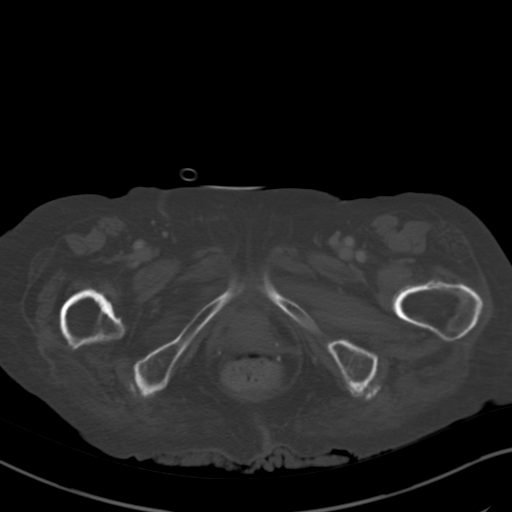
[im 16/77  soft-tissue]
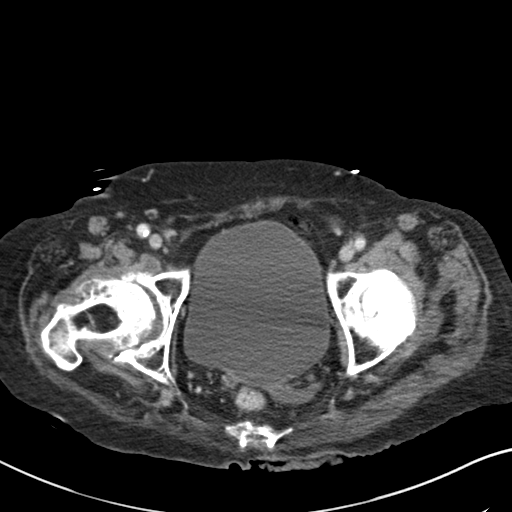
[im 21/77  soft-tissue]
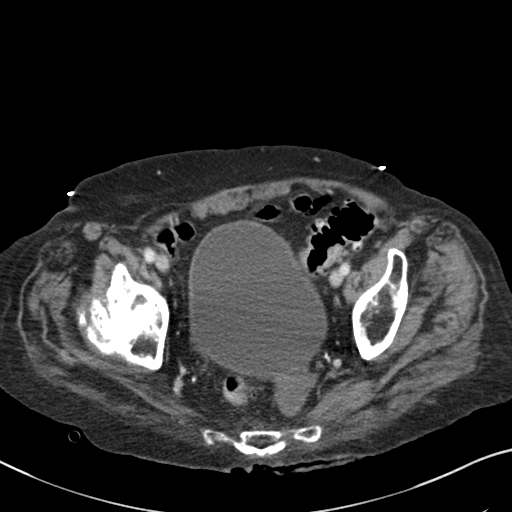
[im 31/77  soft-tissue]
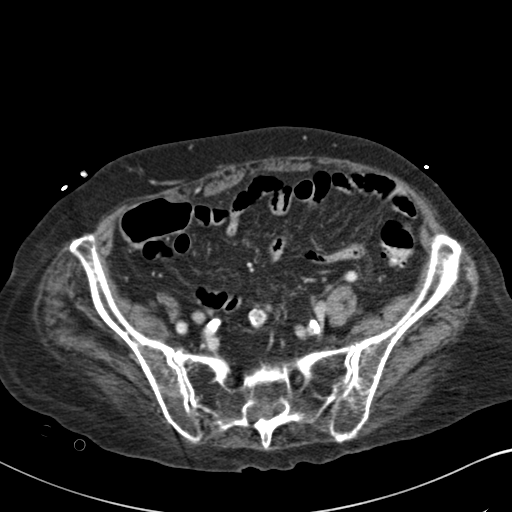
[im 41/77  soft-tissue]
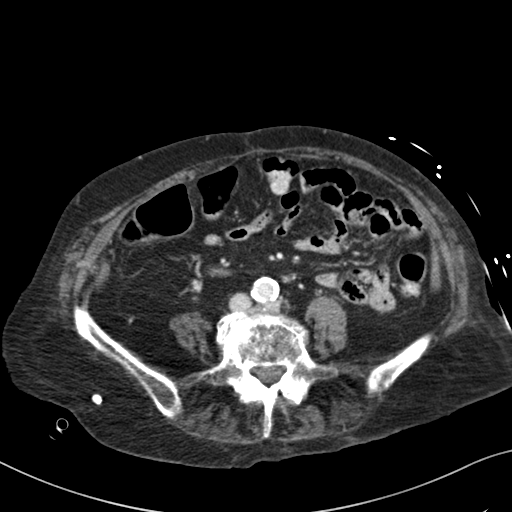
[im 46/77  soft-tissue]
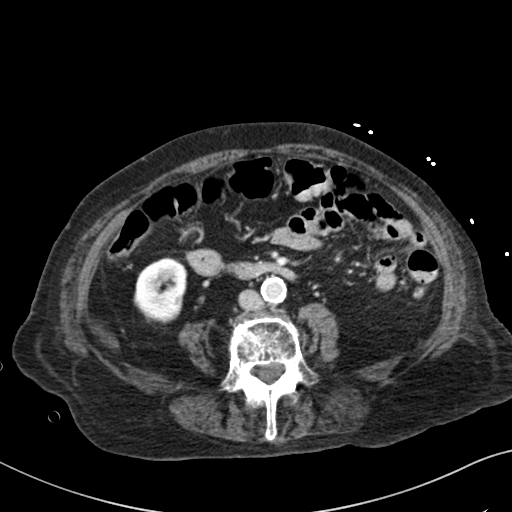
[im 56/77  soft-tissue]
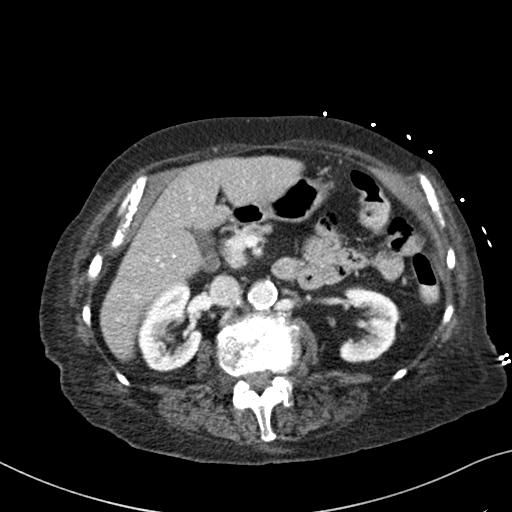
[im 56/77  lung]
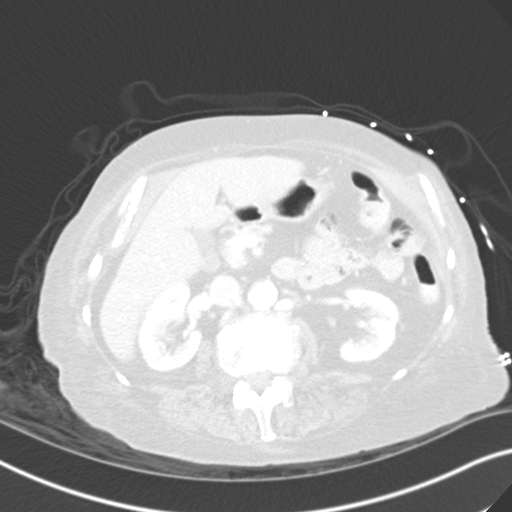
[im 61/77  soft-tissue]
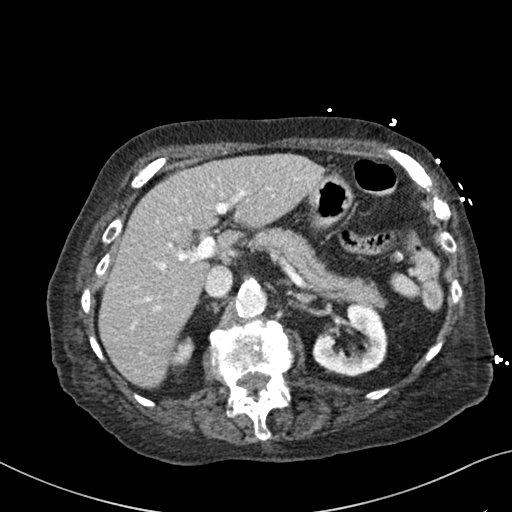
[im 61/77  lung]
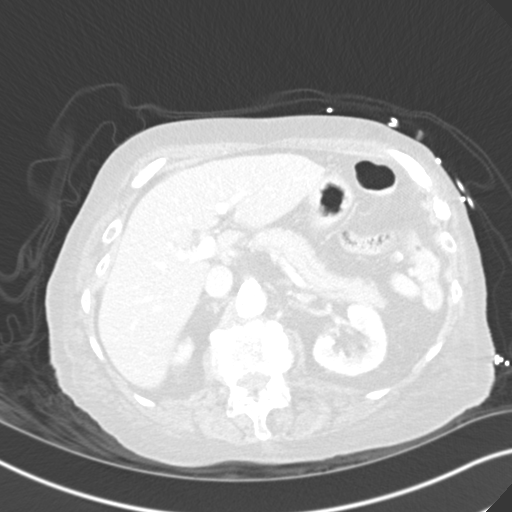
[im 66/77  lung]
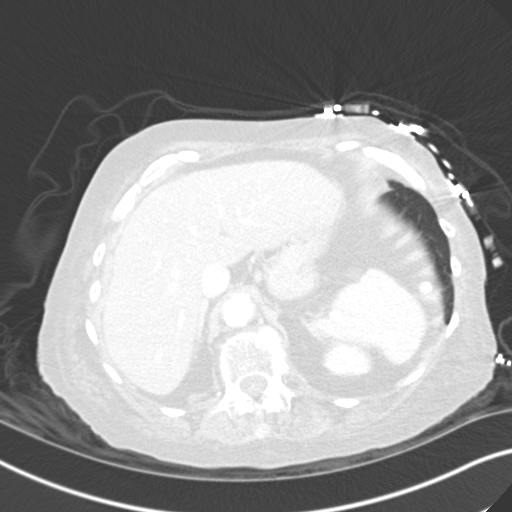
[im 71/77  soft-tissue]
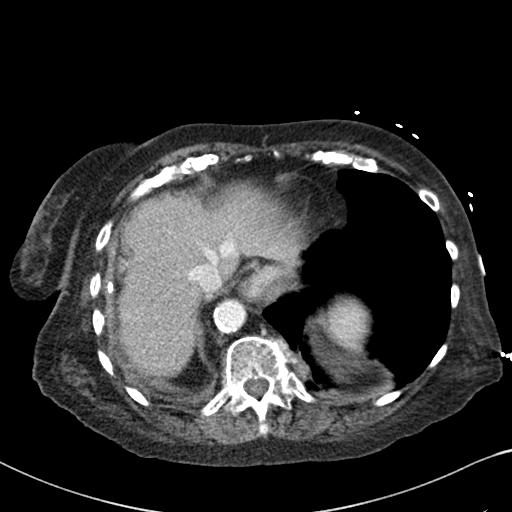
[im 71/77  lung]
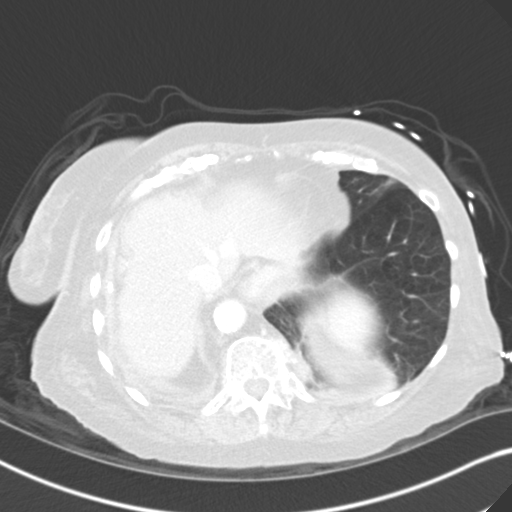
[im 71/77  bone]
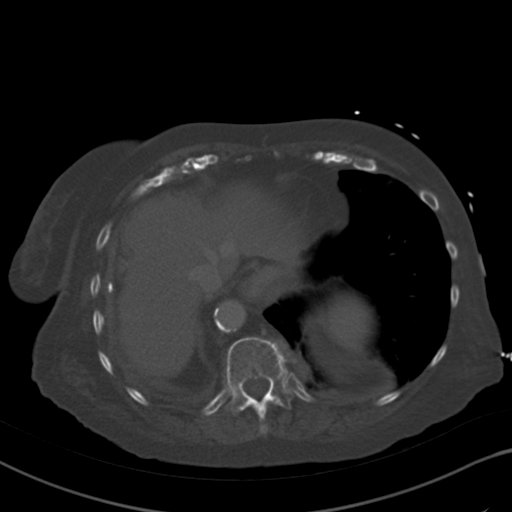

[Series 5: coronal st · coronal · 0.66mm/px · 3 of 119 slices shown]
[im 24/119  soft-tissue]
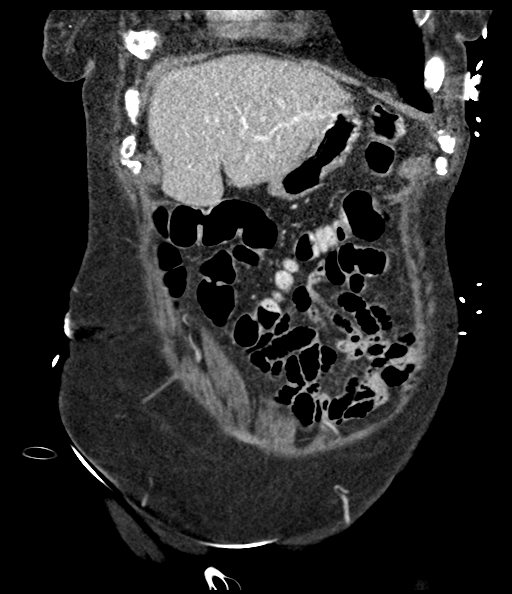
[im 48/119  soft-tissue]
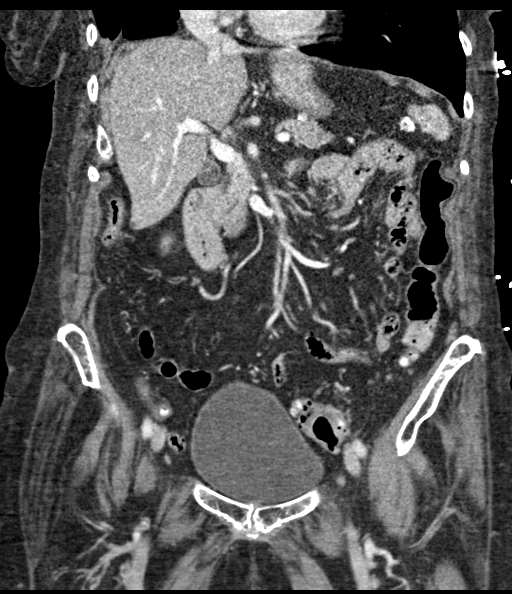
[im 71/119  soft-tissue]
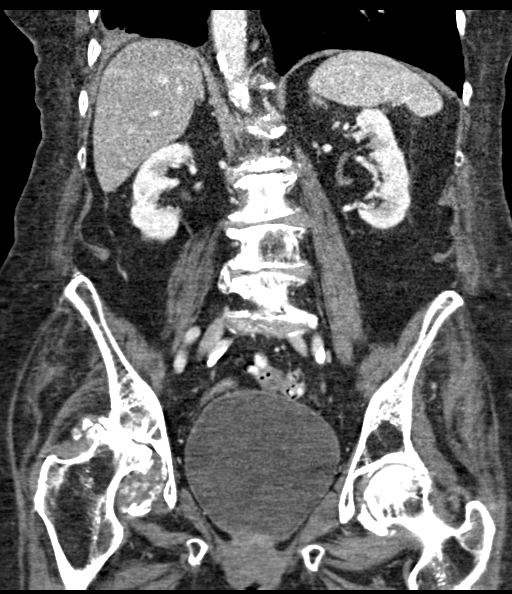

[13 of 46 positions shown; findings below may reference images not displayed]

FINDINGS: LOWER CHEST: Unchanged scarring at the right lung base with pleural
calcification.

HEPATOBILIARY: Normal hepatic contours. No intra- or extrahepatic
biliary dilatation. The gallbladder is normal.

PANCREAS: Normal pancreas. No ductal dilatation or peripancreatic
fluid collection.

SPLEEN: Normal.

ADRENALS/URINARY TRACT: The adrenal glands are normal. No
hydronephrosis, nephroureterolithiasis or solid renal mass. The
urinary bladder is normal for degree of distention

STOMACH/BOWEL: There is no hiatal hernia. Normal duodenal course and
caliber. No small bowel dilatation or inflammation. Rectosigmoid
diverticulosis without acute inflammation. Normal appendix.

VASCULAR/LYMPHATIC: There is calcific atherosclerosis of the
abdominal aorta. No abdominal or pelvic lymphadenopathy.

REPRODUCTIVE: No adnexal mass.  Status post hysterectomy.

MUSCULOSKELETAL. No bony spinal canal stenosis or focal osseous
abnormality.

OTHER: None.
IMPRESSION: 1. No acute abnormality of the abdomen or pelvis.
2. Rectosigmoid diverticulosis without acute inflammation.
3. Aortic Atherosclerosis ([id number]).

## 2021-07-26 IMAGING — CT CT ABD-PELV W/ CM
1 of 3 series · 13 of 32 positions shown, 19 images · IV contrast (APPLIED)
Comparison: November 27, 2019.

CLINICAL DATA: Left lower quadrant abdominal pain.

EXAM:
CT ABDOMEN AND PELVIS WITH CONTRAST
TECHNIQUE: Multidetector CT imaging of the abdomen and pelvis was performed
using the standard protocol following bolus administration of
intravenous contrast.
CONTRAST:  100mL 5AOLZI-CYY IOPAMIDOL (5AOLZI-CYY) INJECTION 61%

[Series 2: abd/pelvis w/cm · axial · 0.77mm/px · z∈[-498,-108]mm · 13 of 90 slices shown, 19 images]
[im 6/90  soft-tissue]
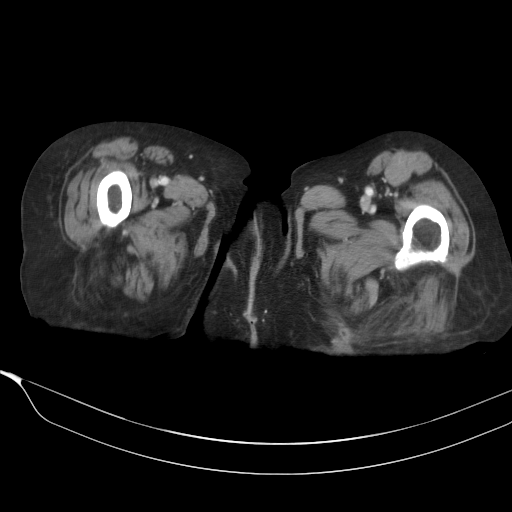
[im 6/90  bone]
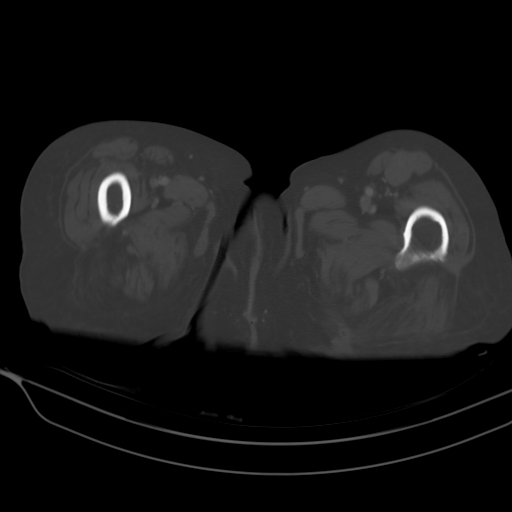
[im 12/90  soft-tissue]
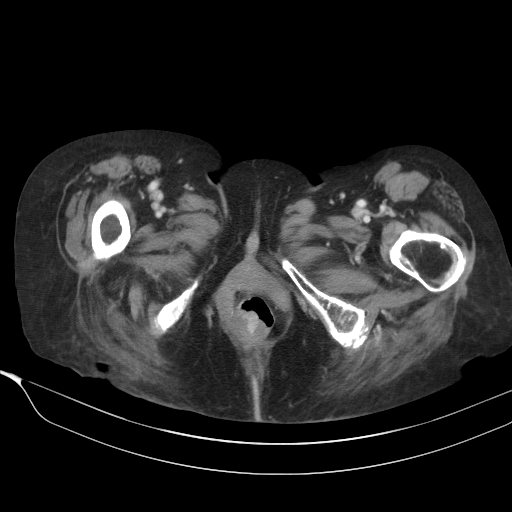
[im 18/90  soft-tissue]
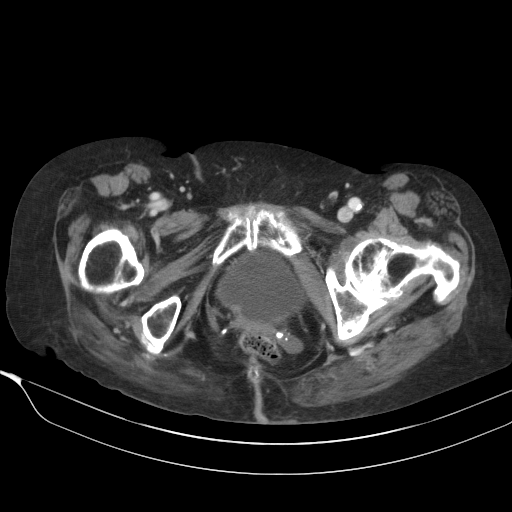
[im 24/90  soft-tissue]
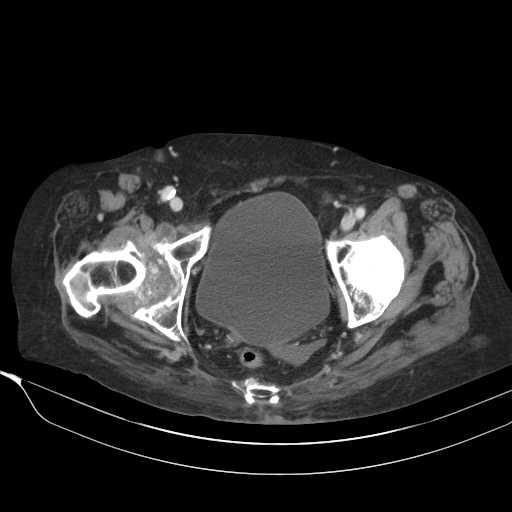
[im 30/90  soft-tissue]
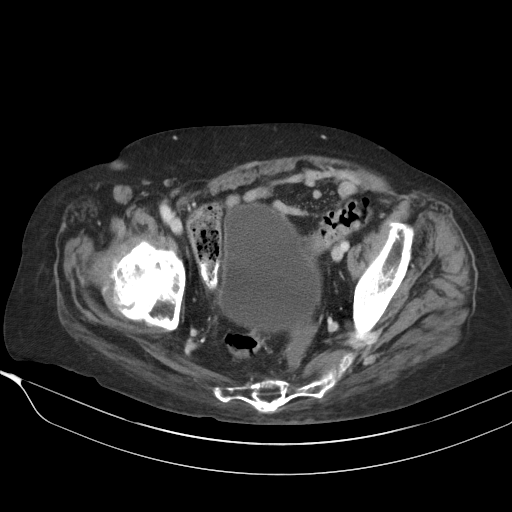
[im 36/90  soft-tissue]
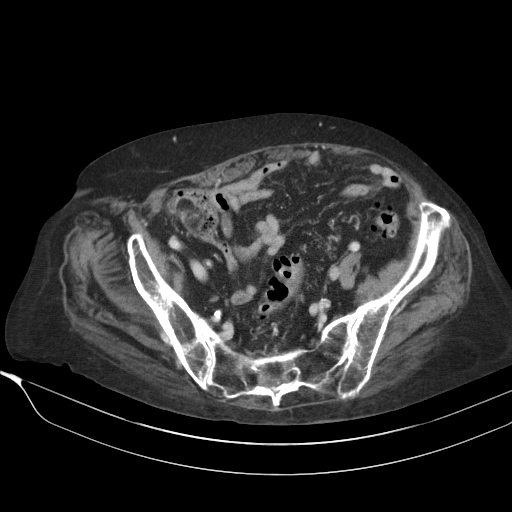
[im 48/90  soft-tissue]
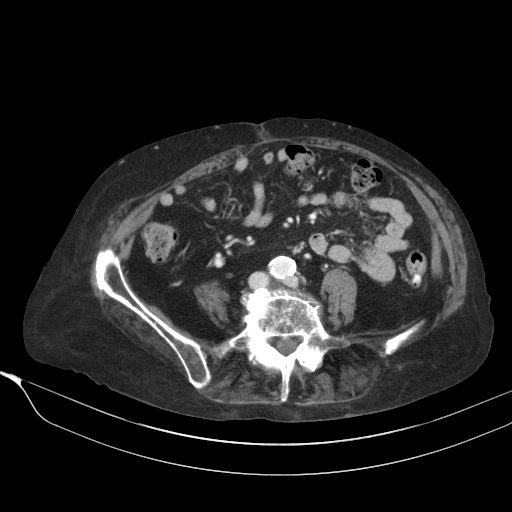
[im 54/90  soft-tissue]
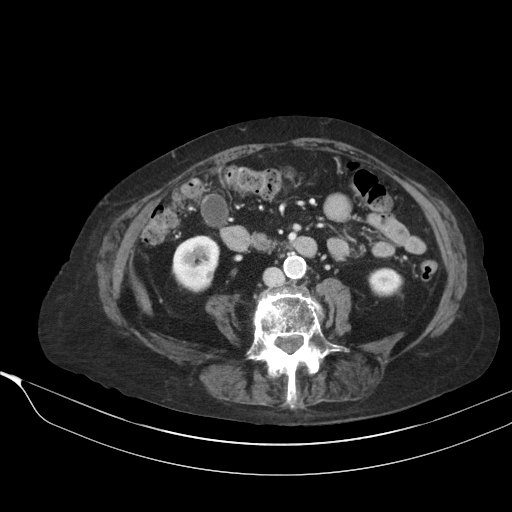
[im 60/90  soft-tissue]
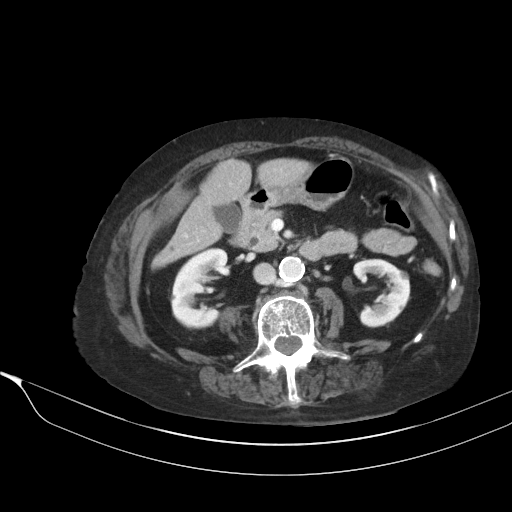
[im 60/90  bone]
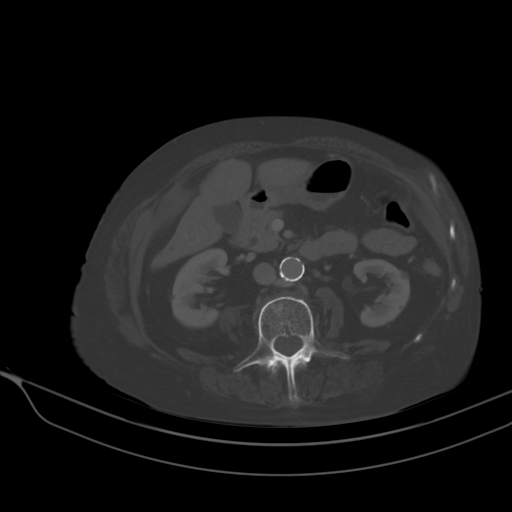
[im 66/90  soft-tissue]
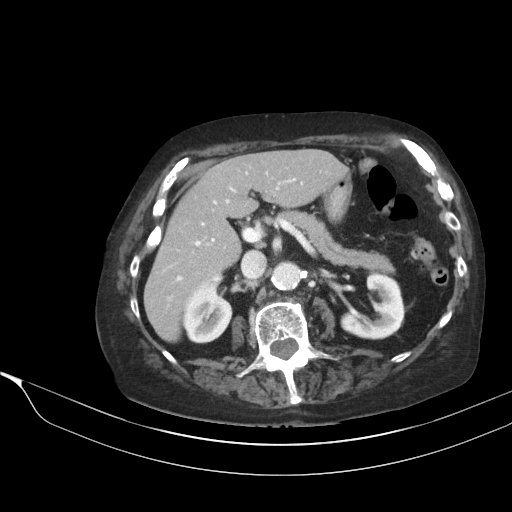
[im 66/90  lung]
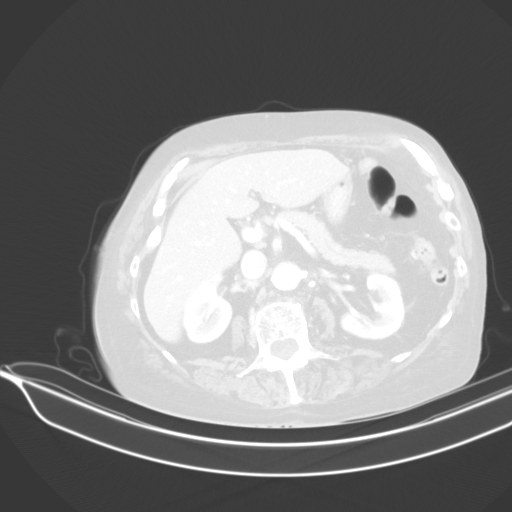
[im 72/90  soft-tissue]
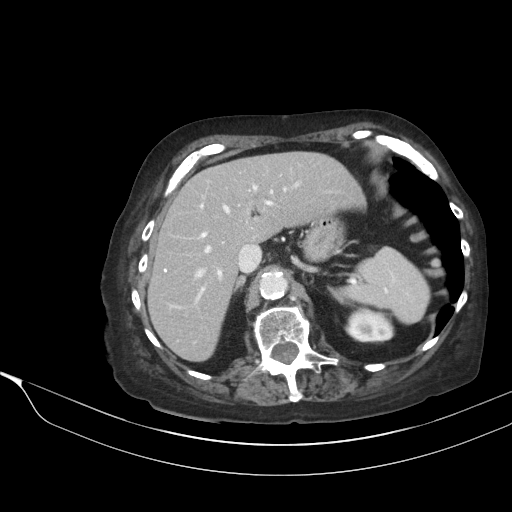
[im 72/90  lung]
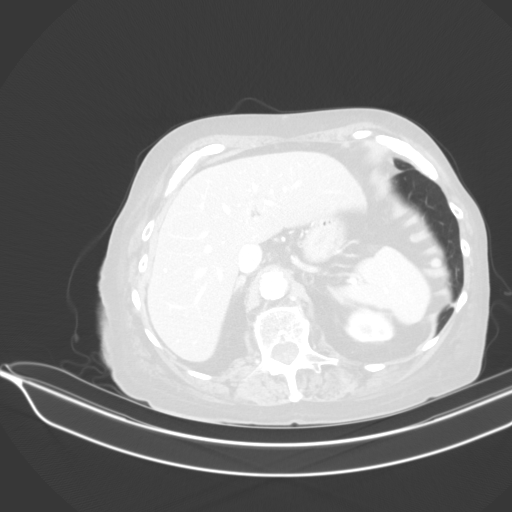
[im 78/90  soft-tissue]
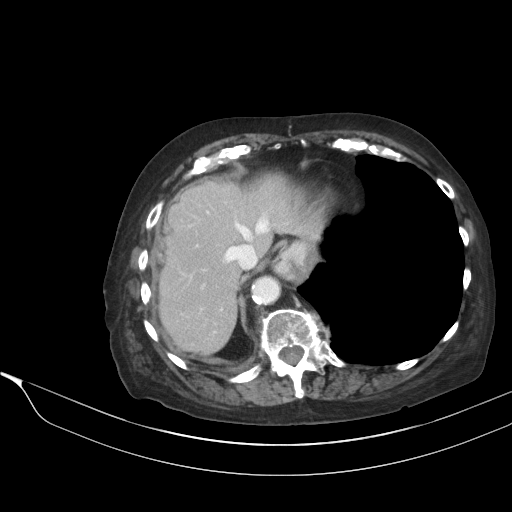
[im 78/90  lung]
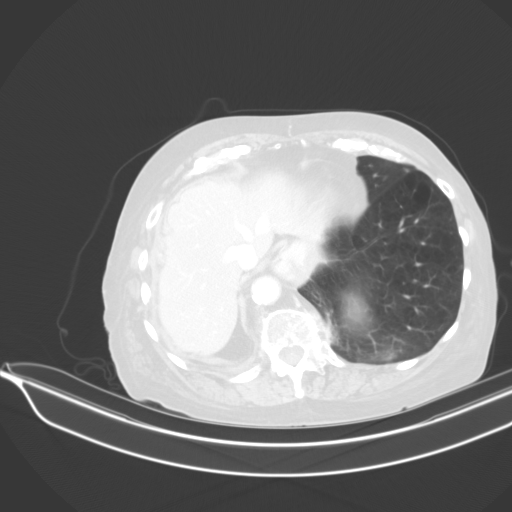
[im 84/90  soft-tissue]
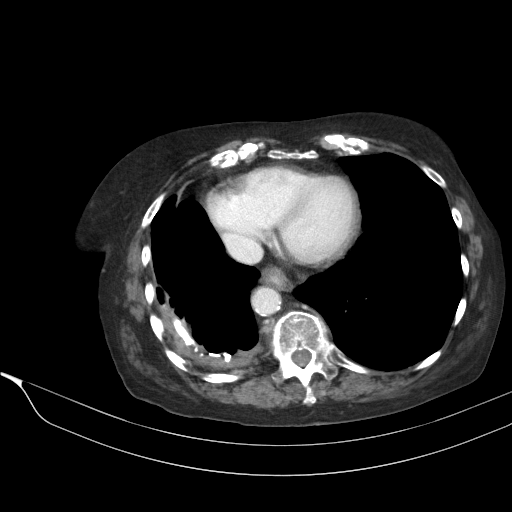
[im 84/90  lung]
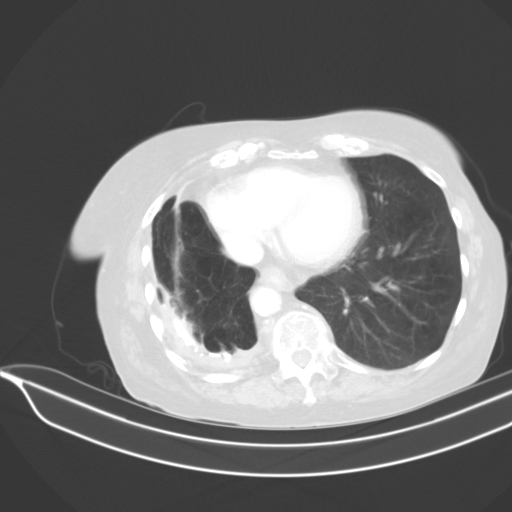

[13 of 32 positions shown; findings below may reference images not displayed]

FINDINGS: Lower chest: No acute abnormality.

Hepatobiliary: Small gallstone is noted. No cholecystitis or biliary
dilatation is noted. Stable hepatic cysts.

Pancreas: Unremarkable. No pancreatic ductal dilatation or
surrounding inflammatory changes.

Spleen: Normal in size without focal abnormality.

Adrenals/Urinary Tract: Adrenal glands are unremarkable. Kidneys are
normal, without renal calculi, focal lesion, or hydronephrosis.
Bladder is unremarkable.

Stomach/Bowel: Stomach is within normal limits. Appendix appears
normal. No evidence of bowel wall thickening, distention, or
inflammatory changes. Sigmoid diverticulosis is noted without
inflammation.

Vascular/Lymphatic: Aortic atherosclerosis. No enlarged abdominal or
pelvic lymph nodes.

Reproductive: Status post hysterectomy. No adnexal masses.

Other: No abdominal wall hernia or abnormality. No abdominopelvic
ascites.

Musculoskeletal: Severe multilevel degenerative disc disease is
noted in the lumbar spine. Severe degenerative changes are seen
involving the right hip joint which may be the sequela of avascular
necrosis.
IMPRESSION: 1. Sigmoid diverticulosis without inflammation.
2. Small gallstone. No cholecystitis or biliary dilatation is noted.
3. Severe multilevel degenerative disc disease is noted in the
lumbar spine.
4. Severe degenerative changes are seen involving the right hip
joint which may be the sequela of avascular necrosis.

Aortic Atherosclerosis (9DYCS-ACV.V).

## 2021-08-02 IMAGING — CR DG FOREARM 2V*L*
2 series · 2 of 2 positions shown · non-contrast
Comparison: None.

CLINICAL DATA: Fall, pain

EXAM:
LEFT WRIST - COMPLETE 3+ VIEW; LEFT FOREARM - 2 VIEW

[x forearm ap left]
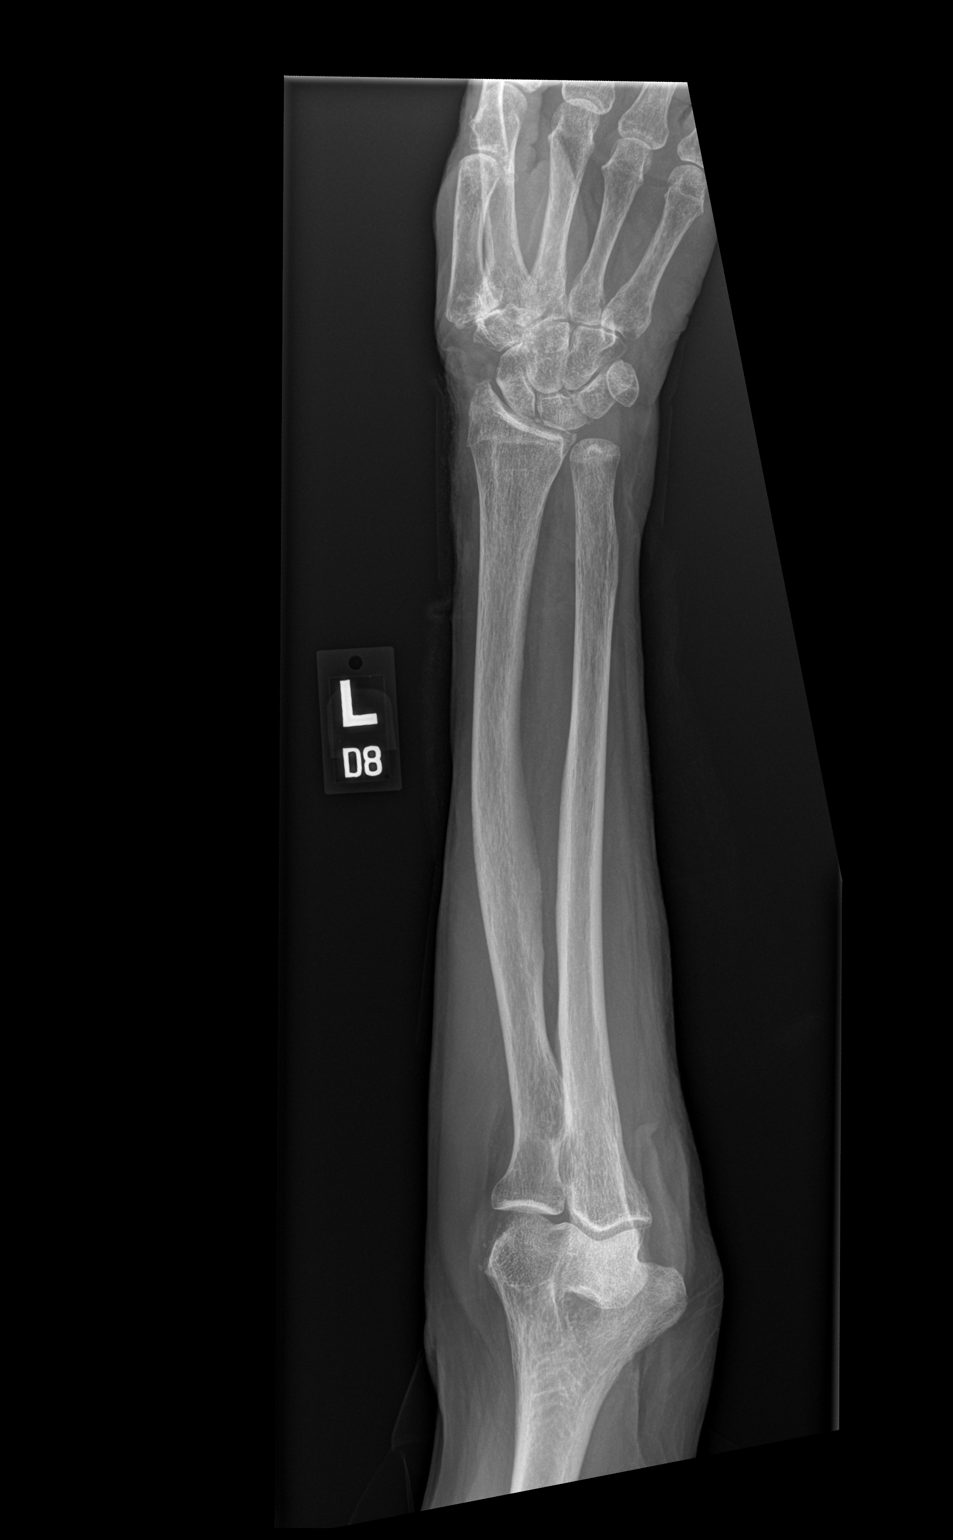

[x forearm lat left]
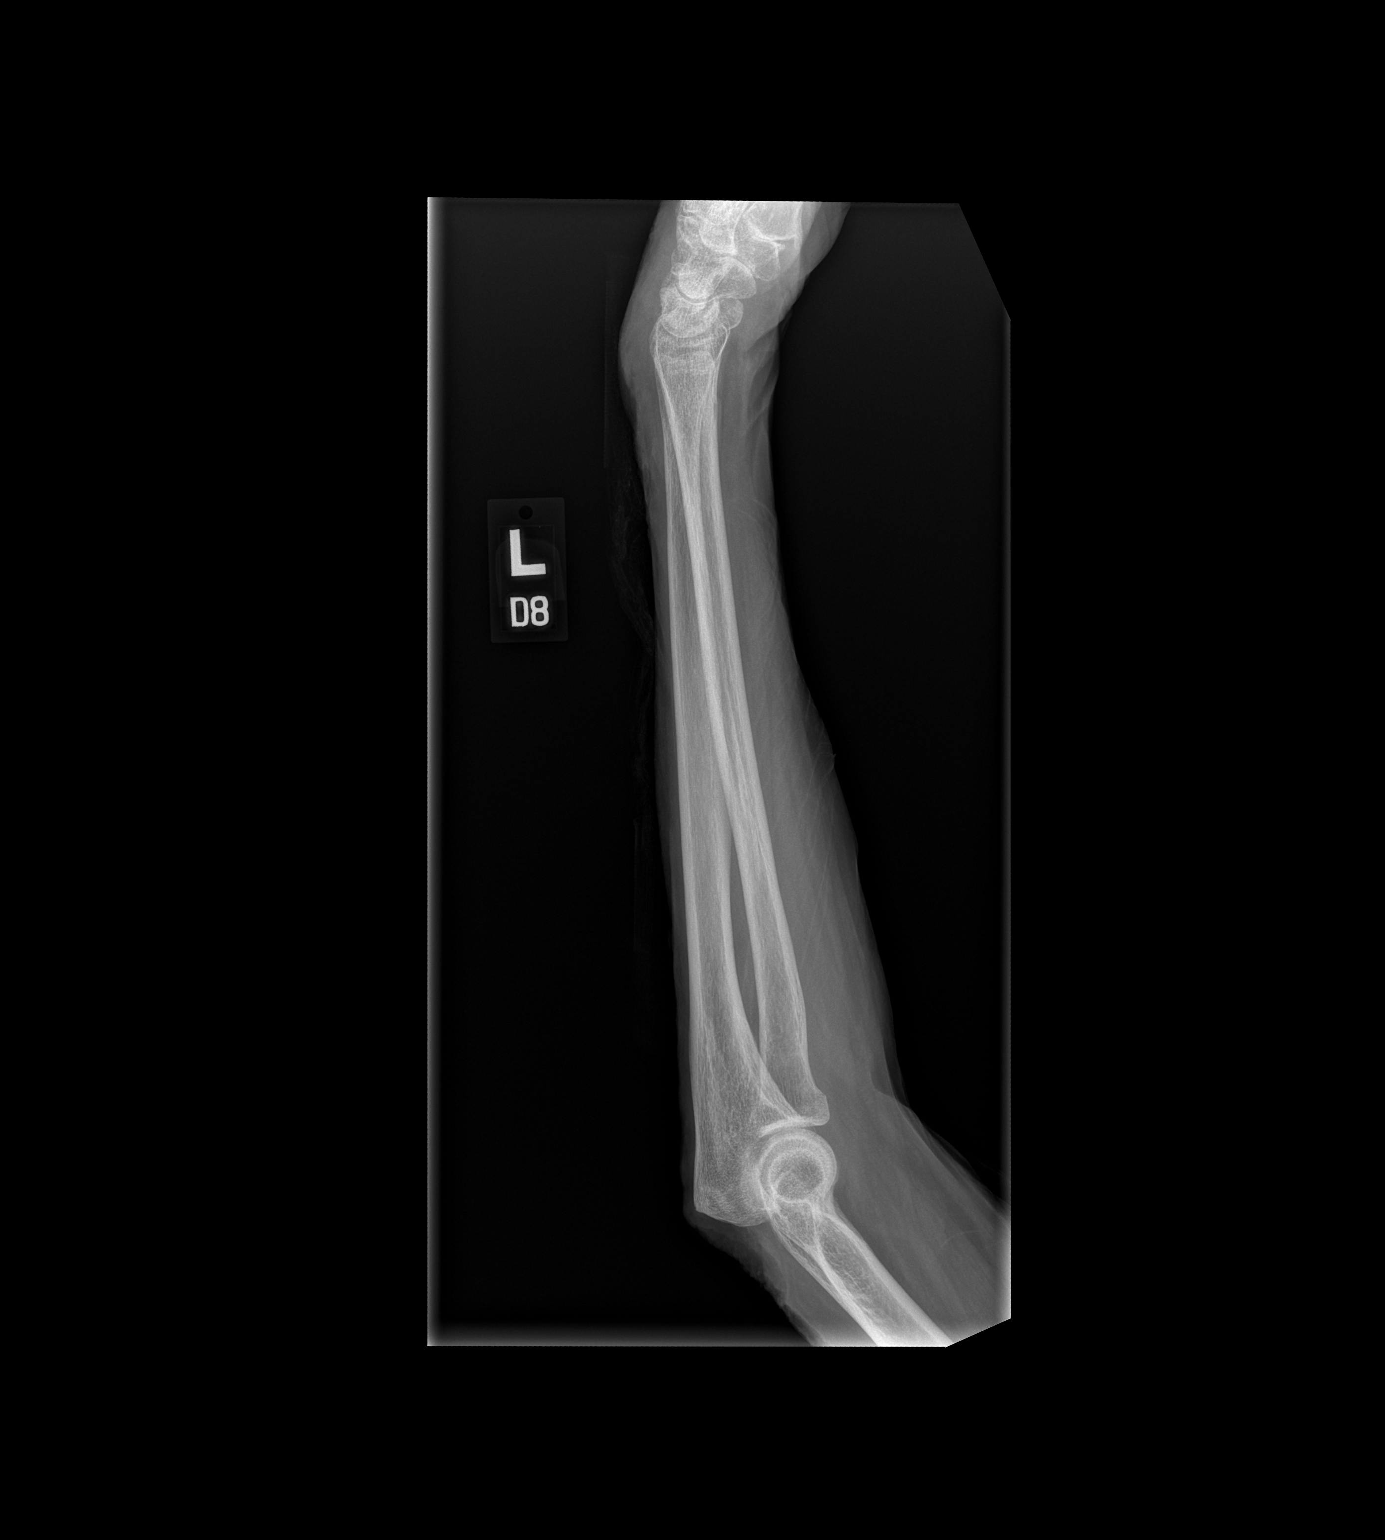

[2 of 2 positions shown; findings below may reference images not displayed]

FINDINGS: No displaced fracture or dislocation of the left radius or ulna or
left wrist. Probable growth arrest lines of the distal radius and
ulna. Osteopenia. Carpal arthrosis. Soft tissues are unremarkable.
IMPRESSION: 1. No displaced fracture or dislocation of the left radius or ulna
or left wrist.

2.  Osteopenia.

## 2021-08-02 IMAGING — CR DG HIP (WITH OR WITHOUT PELVIS) 2-3V*R*
3 series · 3 of 3 positions shown · non-contrast
Comparison: May 20, 2019.

CLINICAL DATA: Right hip pain after fall today.

EXAM:
DG HIP (WITH OR WITHOUT PELVIS) 2-3V RIGHT

[x pelvis]
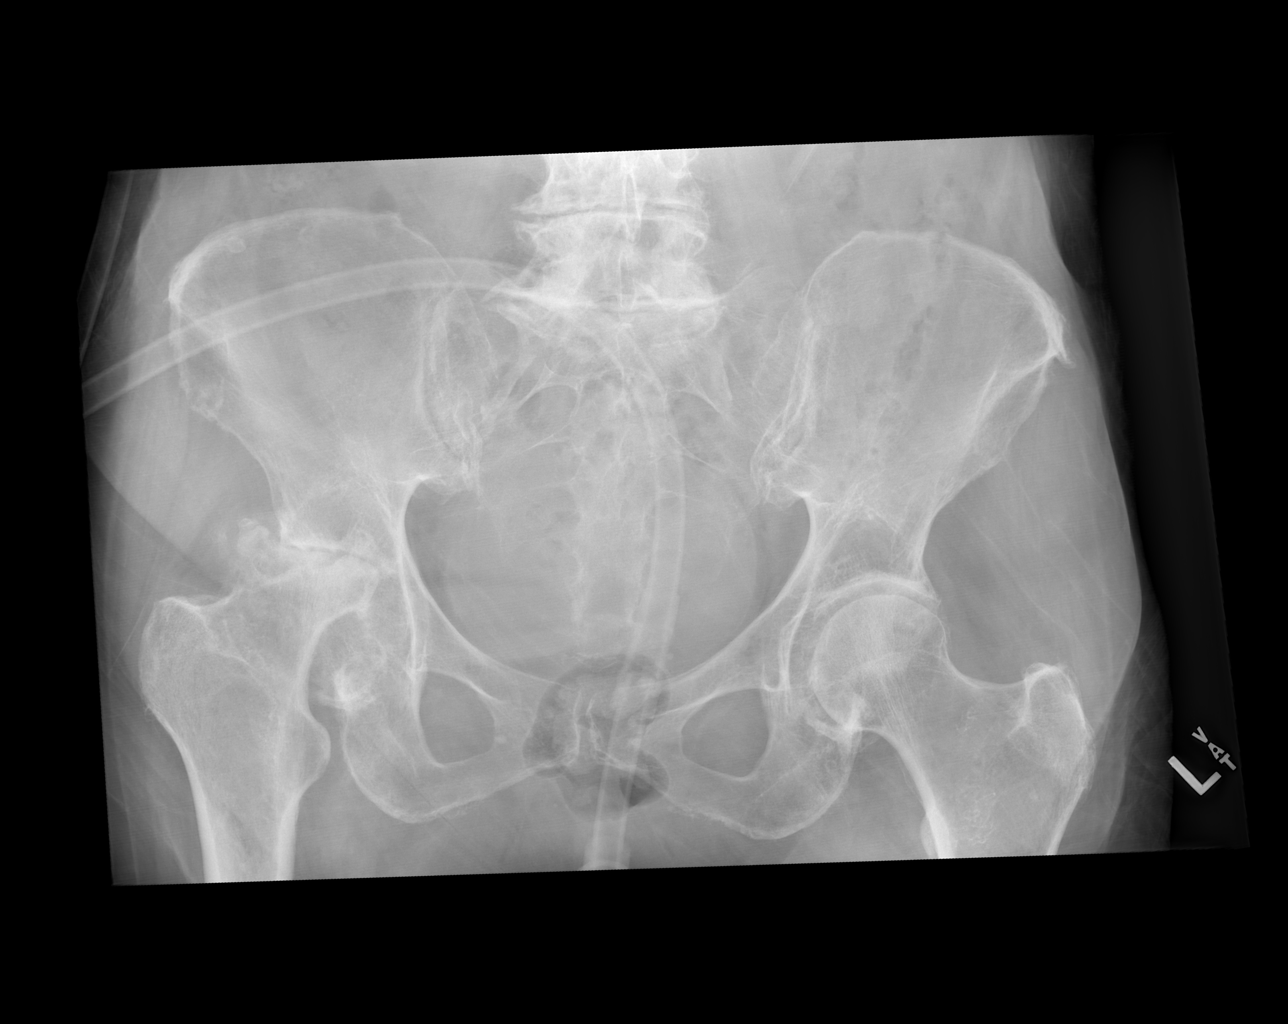

[x hip ap right]
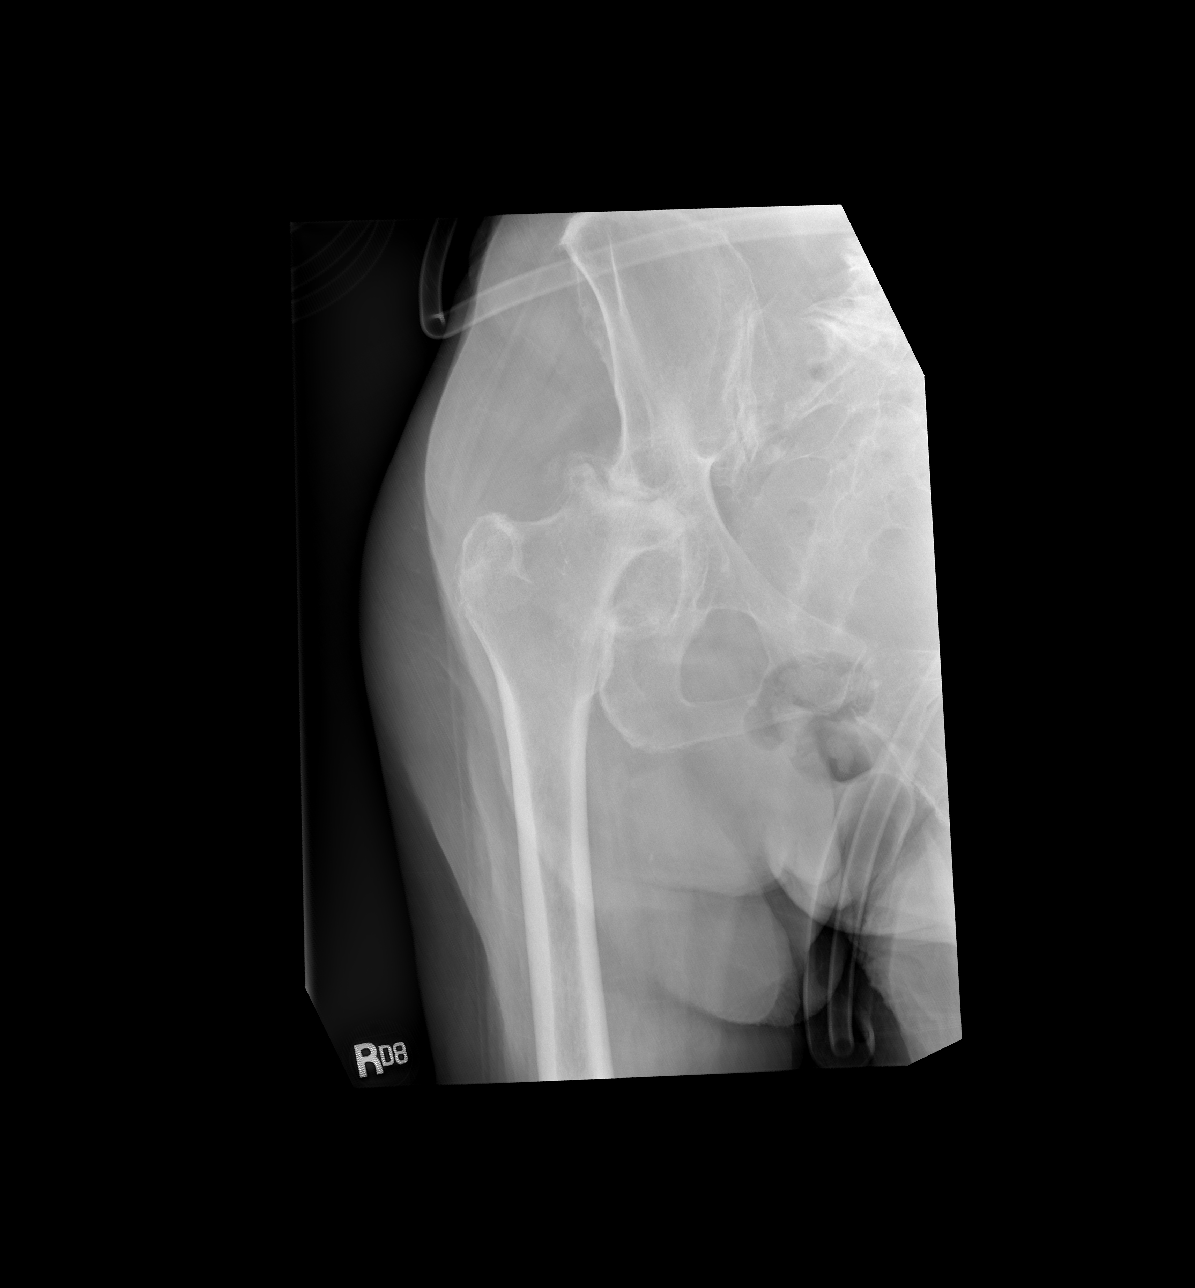

[x hip lat right]
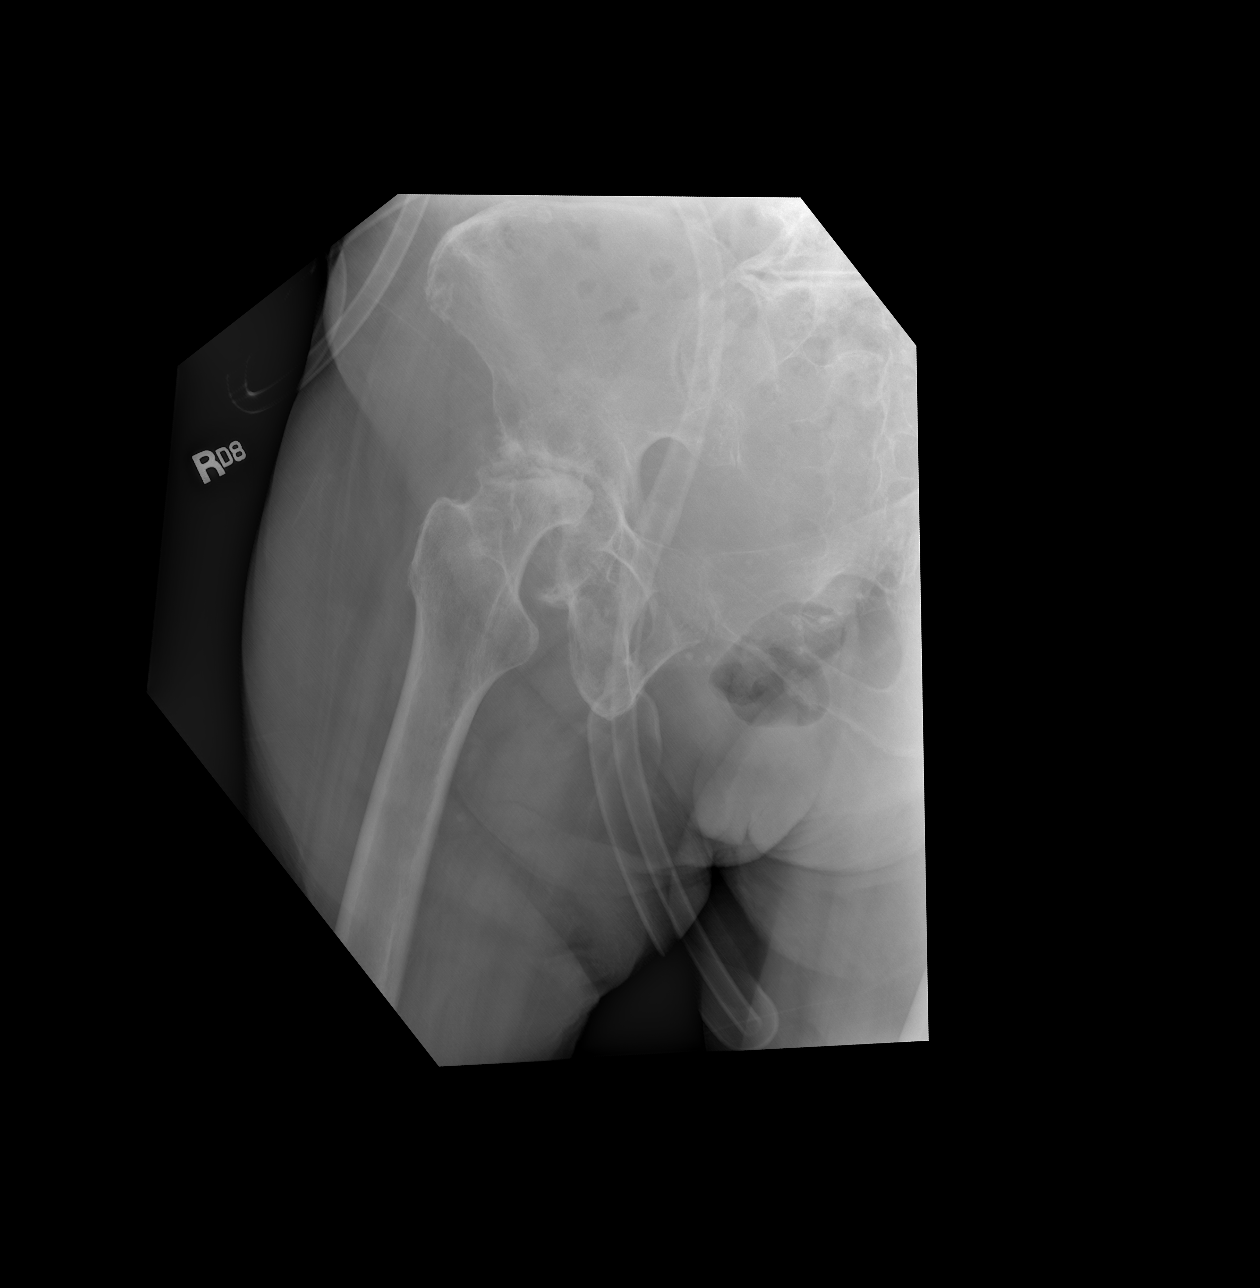

[3 of 3 positions shown; findings below may reference images not displayed]

FINDINGS: No acute fracture is noted. However, there is significant resorption
and deformity of the right femoral head potentially due to avascular
necrosis. This results in severe degenerative joint disease
involving the right hip. Left hip is unremarkable.
IMPRESSION: Severe degenerative joint disease of the right hip is noted,
potentially as a sequela of avascular necrosis. No acute fracture is
noted.

## 2021-08-02 IMAGING — CR DG WRIST COMPLETE 3+V*L*
3 series · 3 of 3 positions shown · non-contrast
Comparison: None.

CLINICAL DATA: Fall, pain

EXAM:
LEFT WRIST - COMPLETE 3+ VIEW; LEFT FOREARM - 2 VIEW

[x wrist pa left]
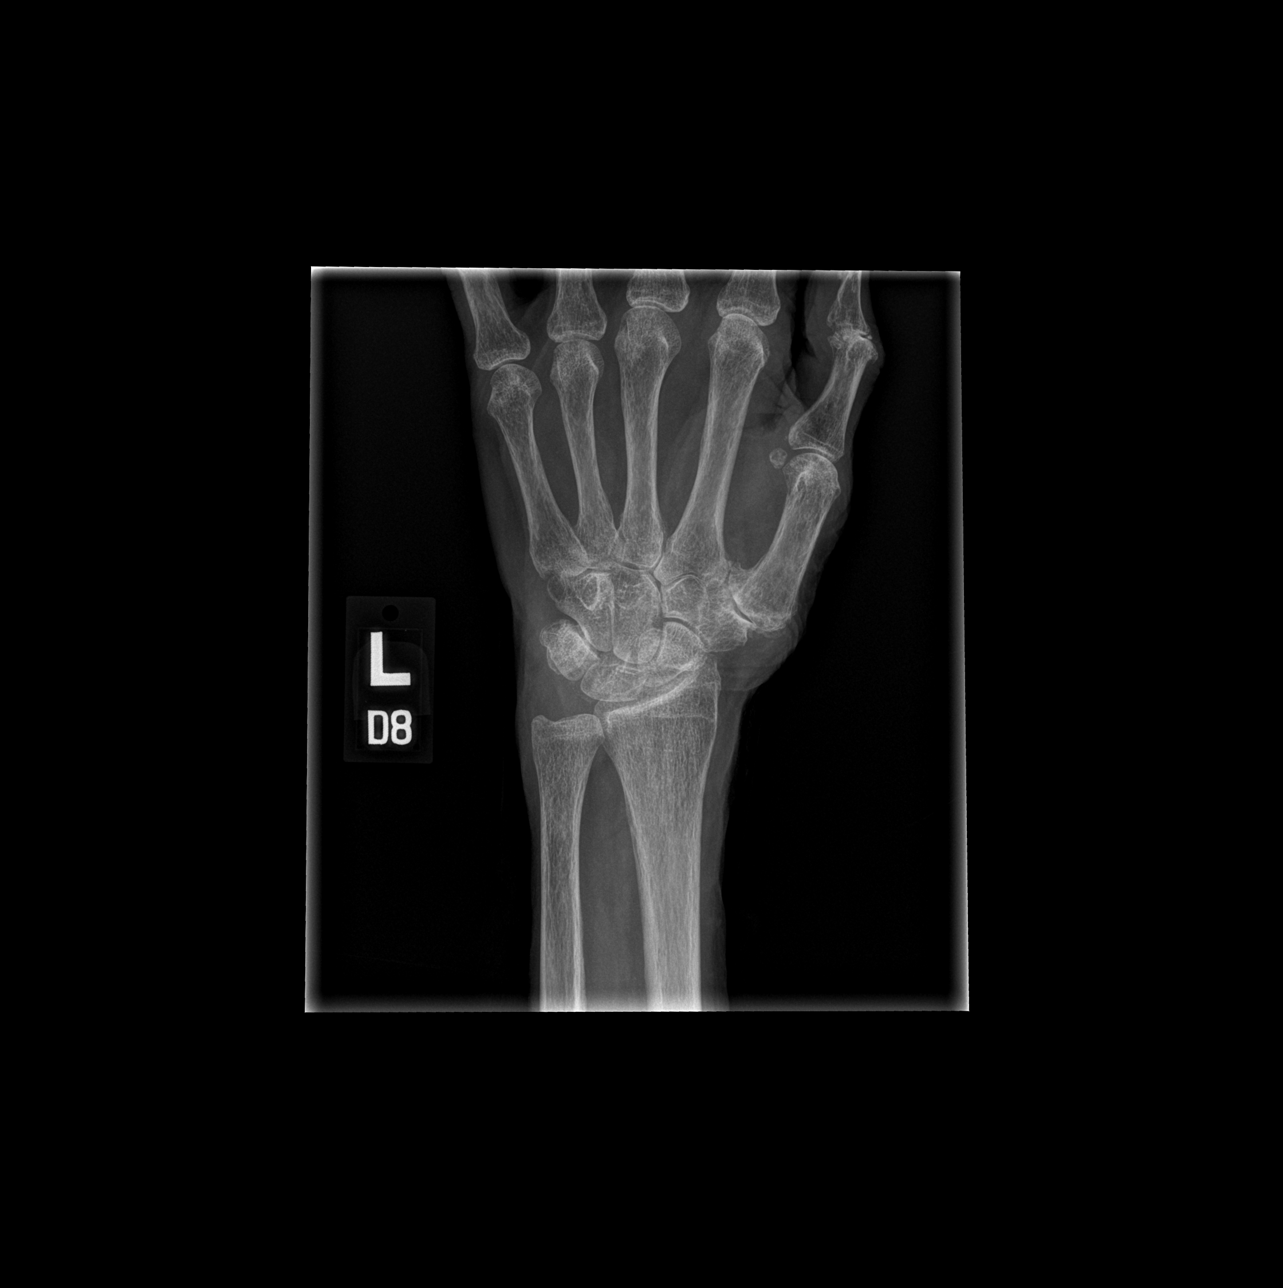

[x wrist obl left]
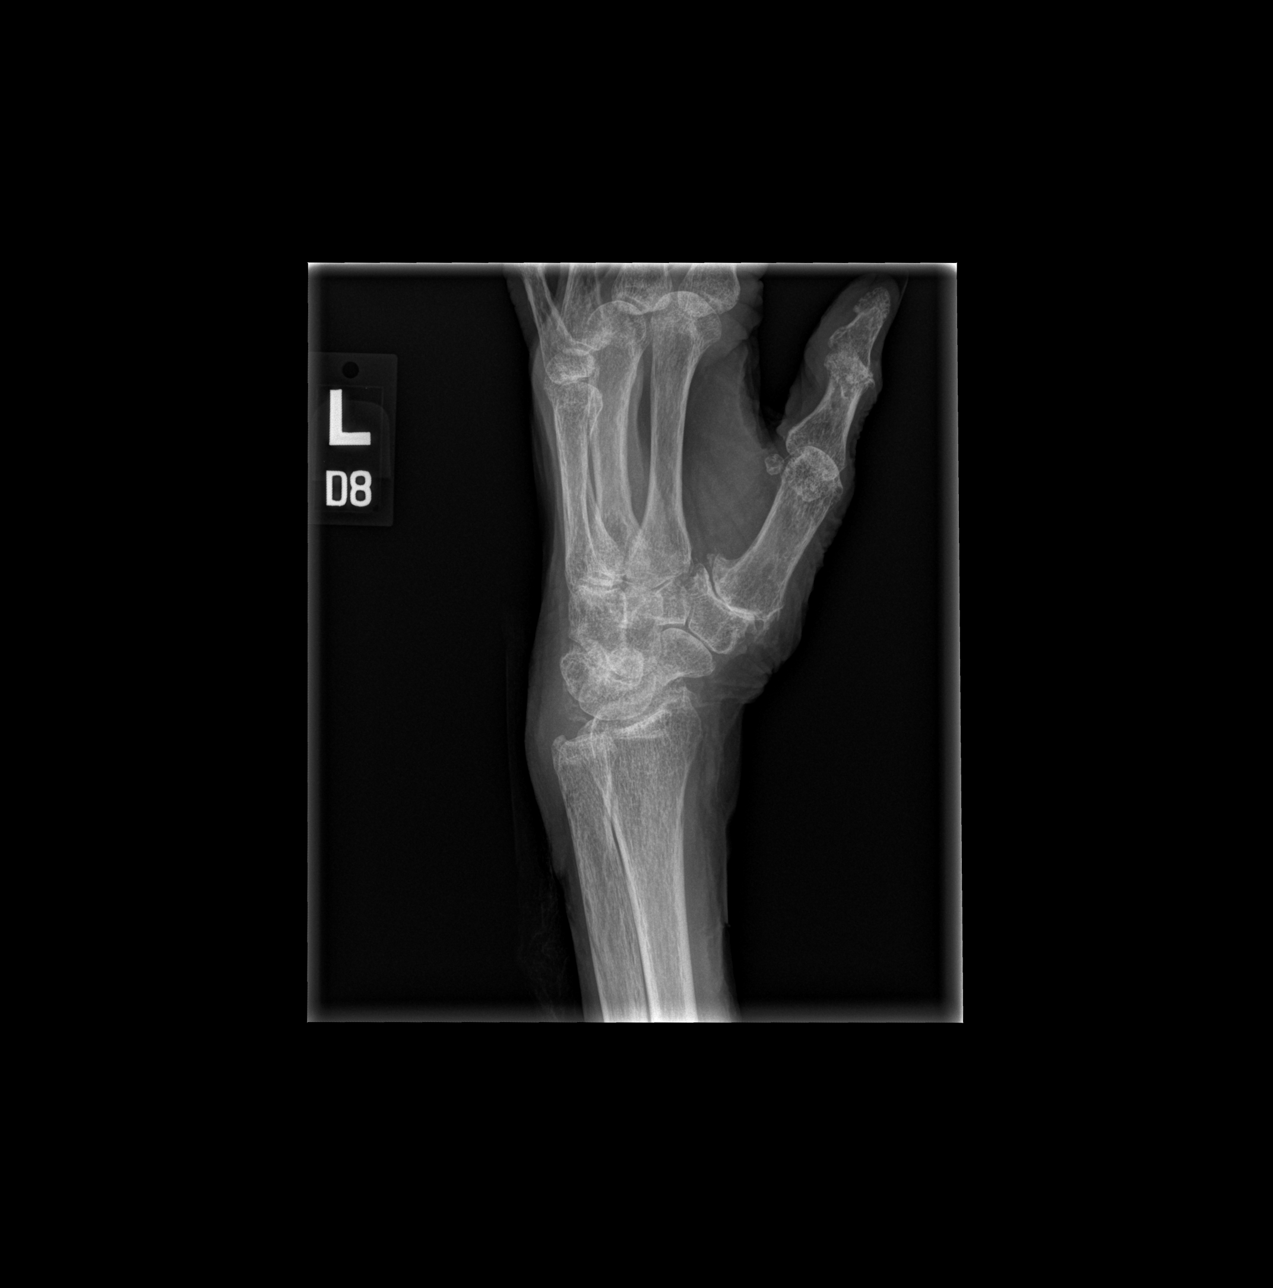

[x wrist lat left]
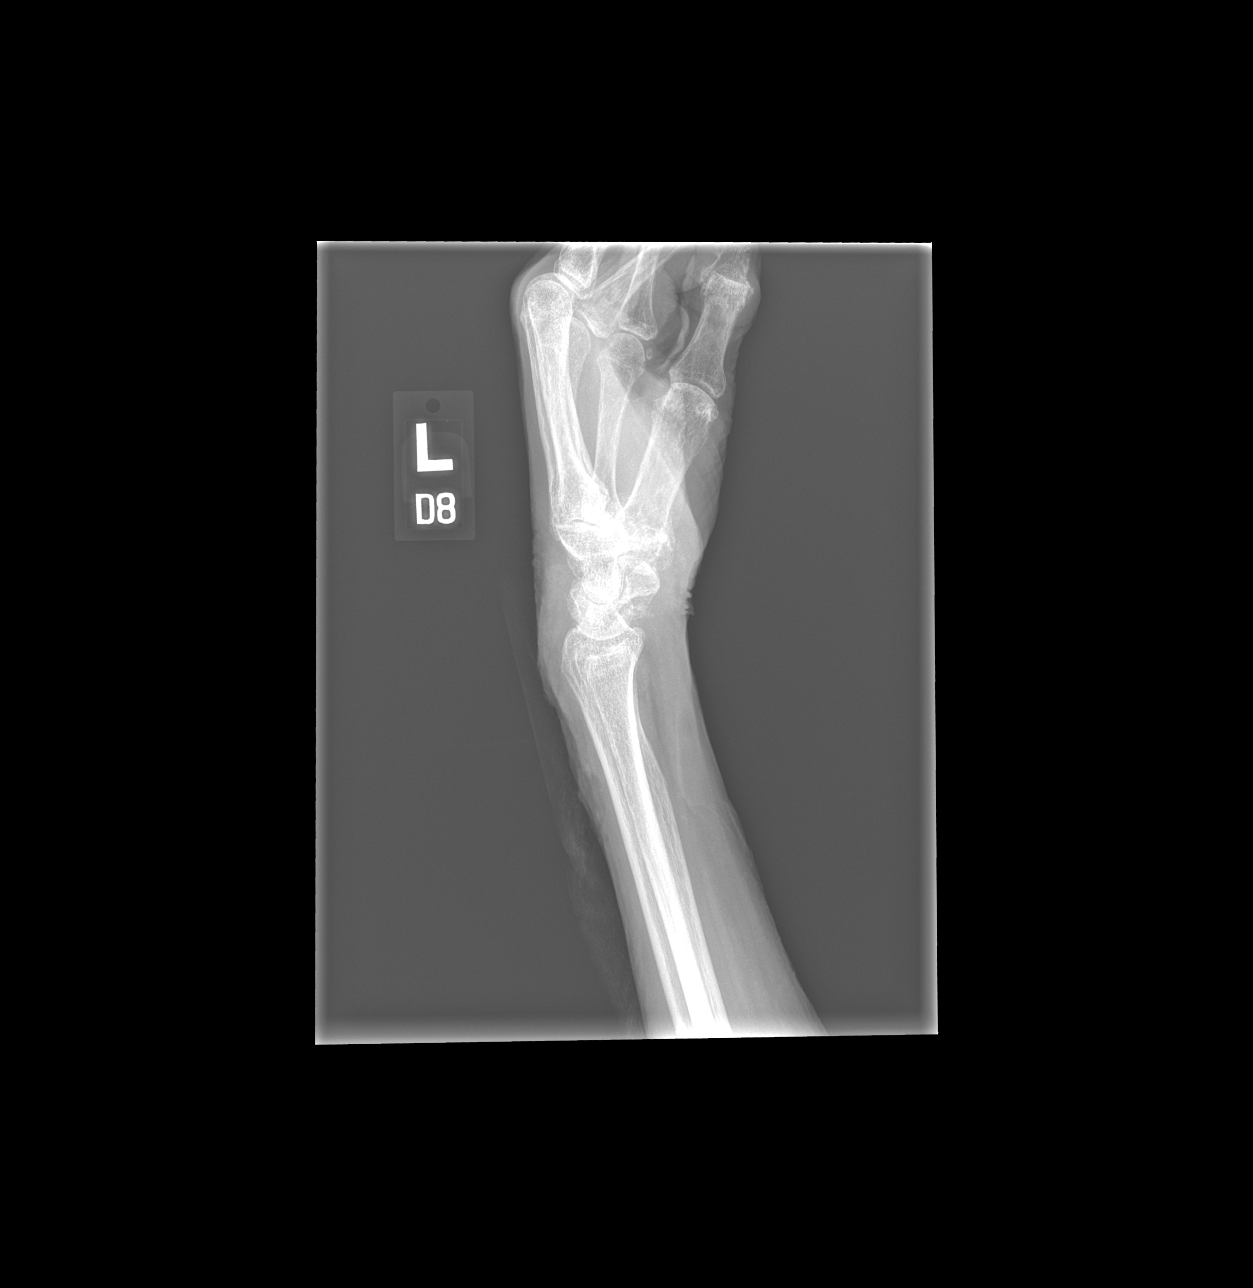

[3 of 3 positions shown; findings below may reference images not displayed]

FINDINGS: No displaced fracture or dislocation of the left radius or ulna or
left wrist. Probable growth arrest lines of the distal radius and
ulna. Osteopenia. Carpal arthrosis. Soft tissues are unremarkable.
IMPRESSION: 1. No displaced fracture or dislocation of the left radius or ulna
or left wrist.

2.  Osteopenia.

## 2021-08-02 IMAGING — CT CT HEAD W/O CM
5 of 7 series · 18 of 47 positions shown, 19 images · non-contrast
Comparison: None.

CLINICAL DATA: Head trauma, fall

EXAM:
CT HEAD WITHOUT CONTRAST
TECHNIQUE: Contiguous axial images were obtained from the base of the skull
through the vertex without intravenous contrast.

[Series 2: head wo · axial · 0.47mm/px · z∈[-71,-26]mm · 2 of 29 slices shown, 3 images]
[im 10/29  brain]
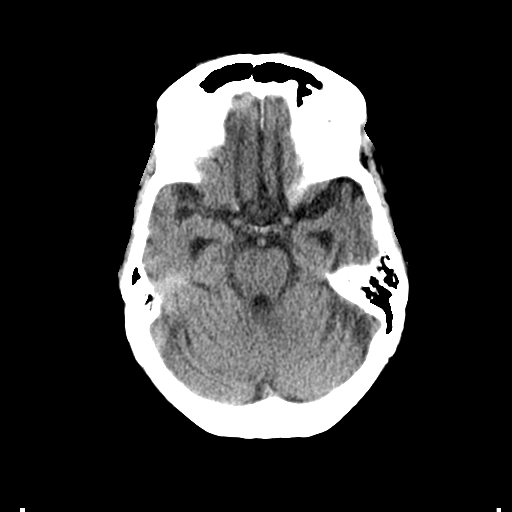
[im 10/29  bone]
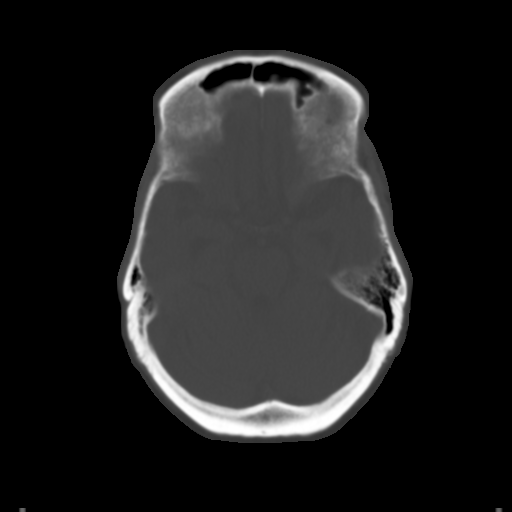
[im 19/29  brain]
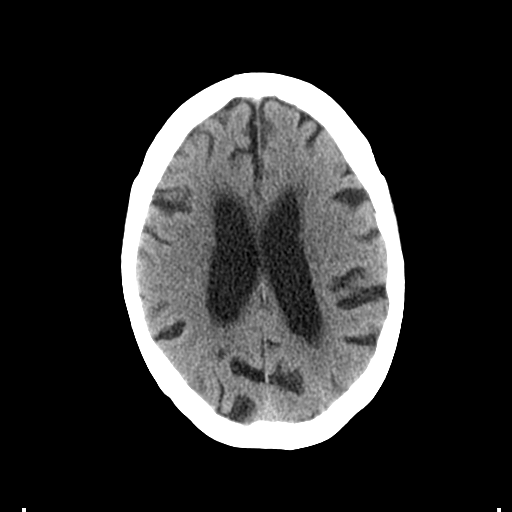

[Series 5: coronal soft tissue · coronal · 0.29mm/px · 3 of 70 slices shown]
[im 24/70  brain]
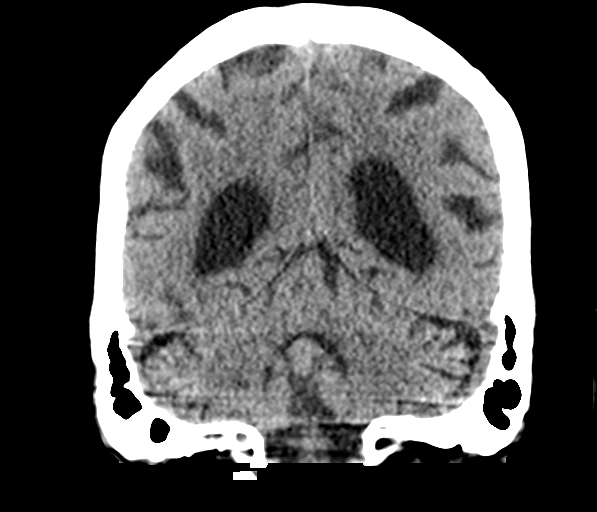
[im 35/70  brain]
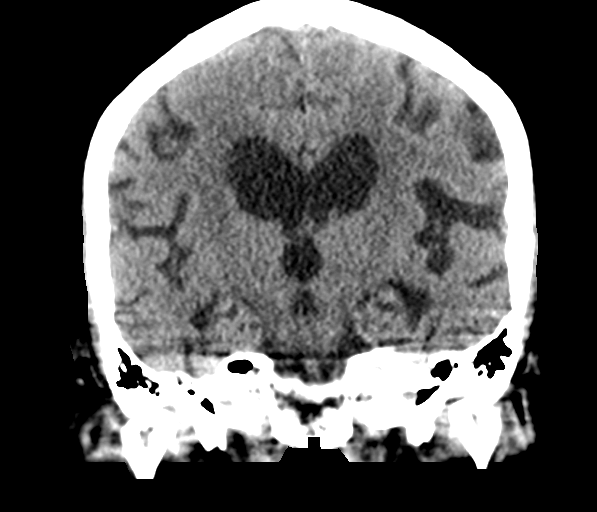
[im 47/70  brain]
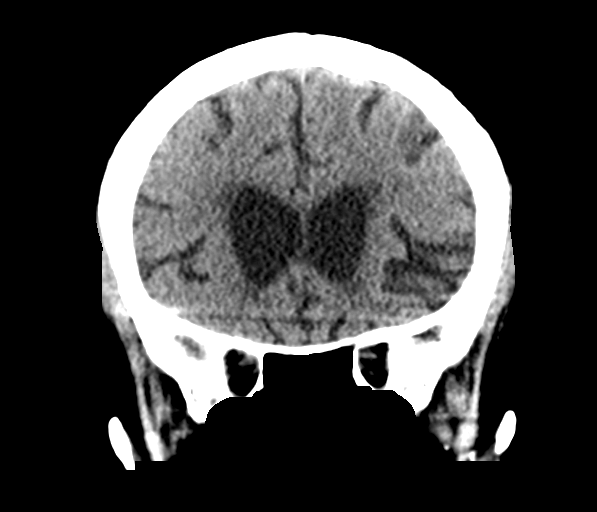

[Series 6: sagittal soft tissue · sagittal · 0.28mm/px · 1 of 56 slices shown]
[im 28/56  brain]
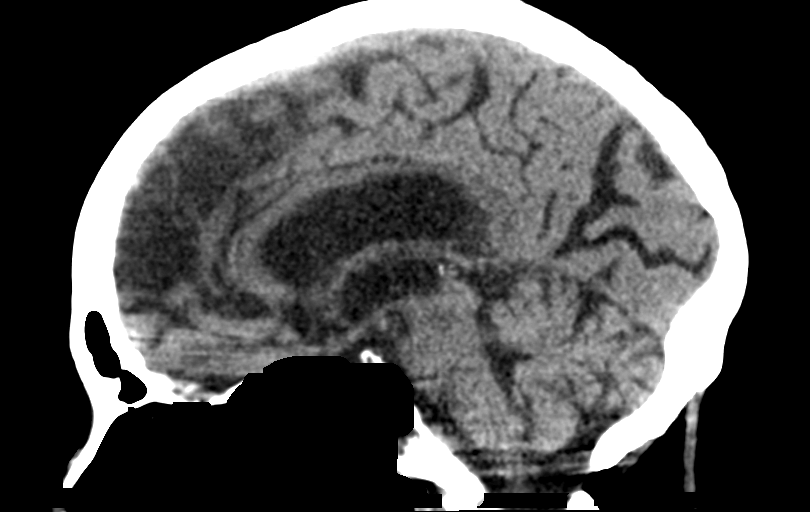

[Series 8: c spine soft · axial · 0.33mm/px · z∈[-236,-180]mm · 4 of 77 slices shown]
[im 7/77  brain]
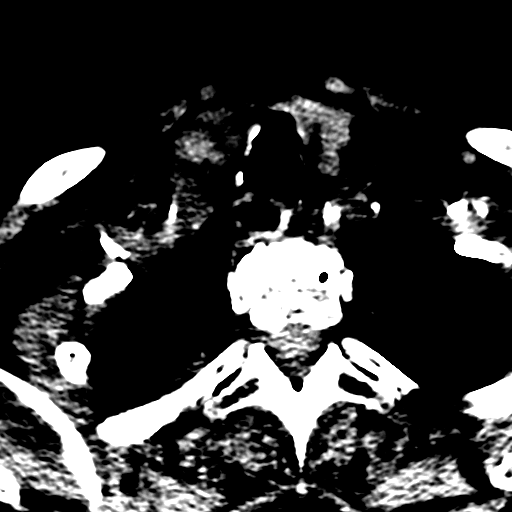
[im 14/77  brain]
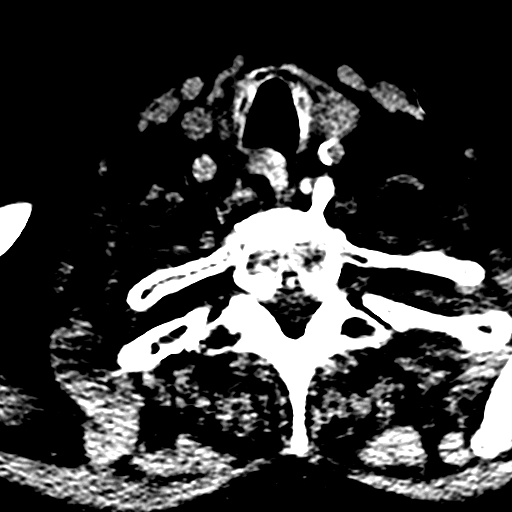
[im 28/77  brain]
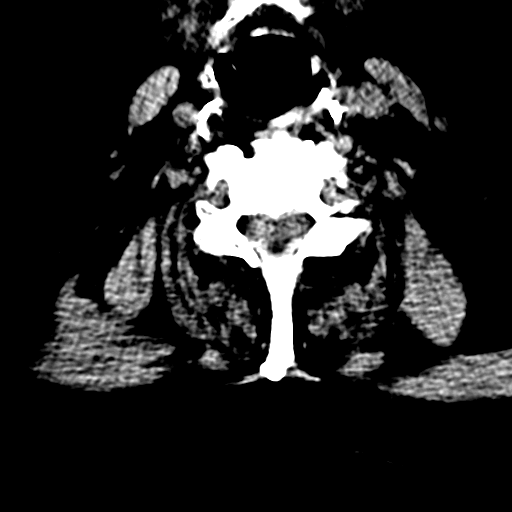
[im 35/77  brain]
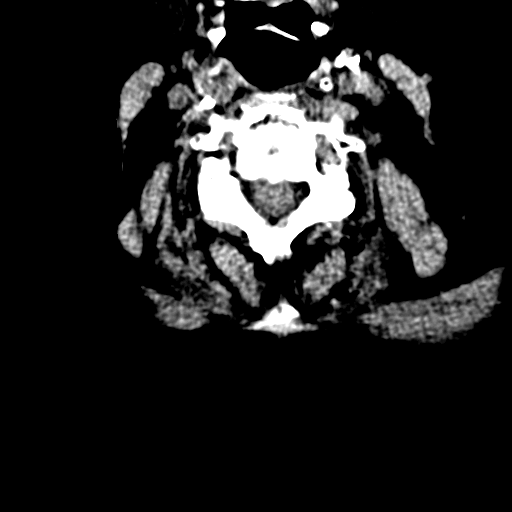

[Series 10: orthogonal bone · axial · 0.23mm/px · z∈[-267,-129]mm · 8 of 90 slices shown]
[im 7/90  bone]
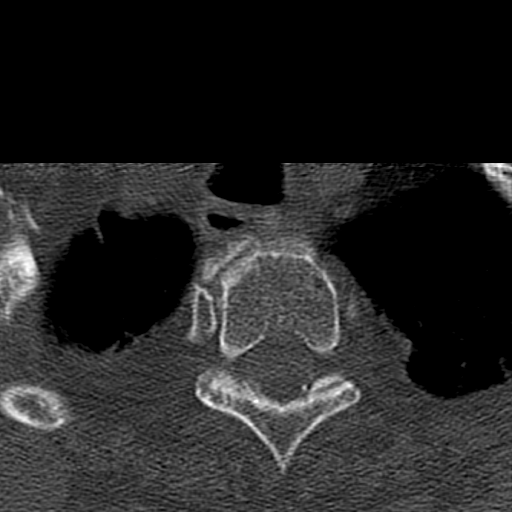
[im 21/90  bone]
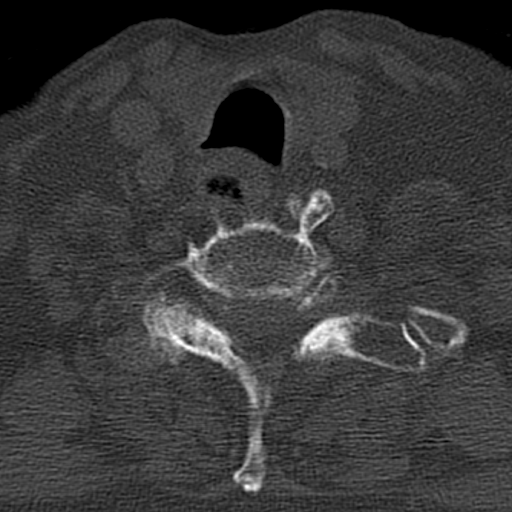
[im 28/90  bone]
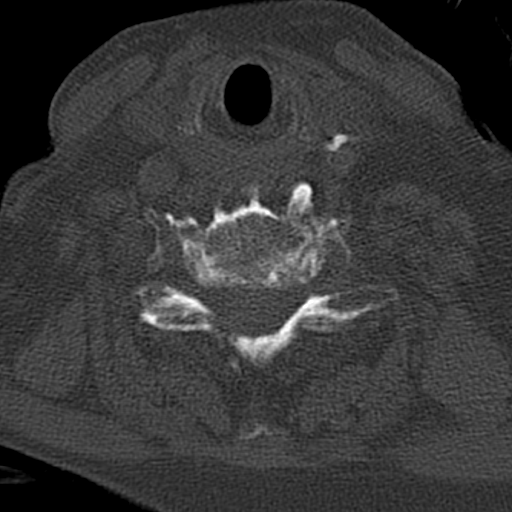
[im 42/90  bone]
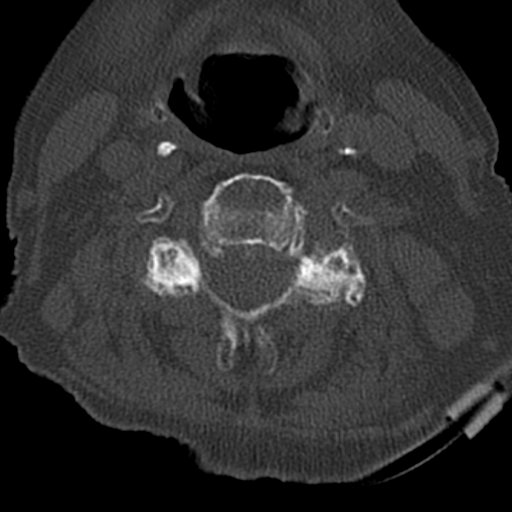
[im 48/90  bone]
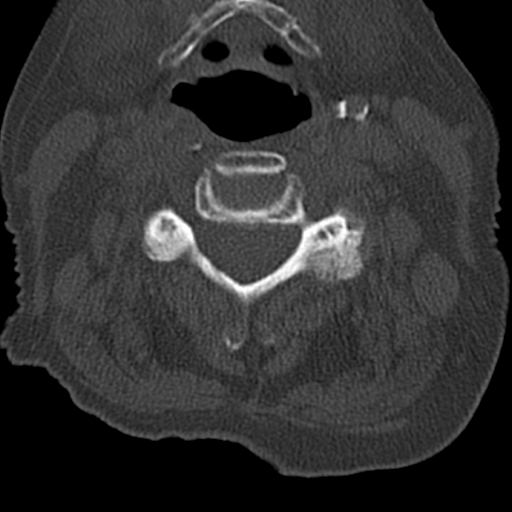
[im 62/90  bone]
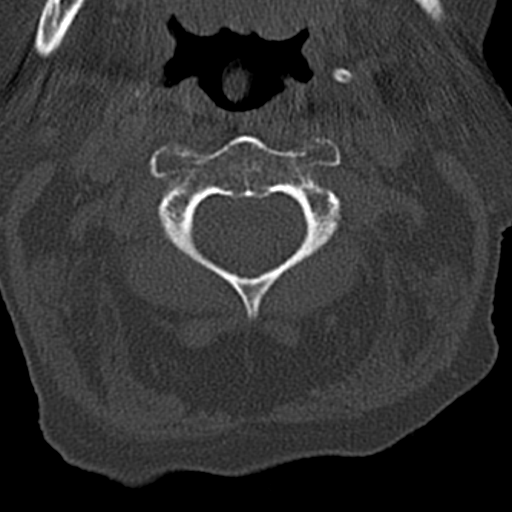
[im 69/90  bone]
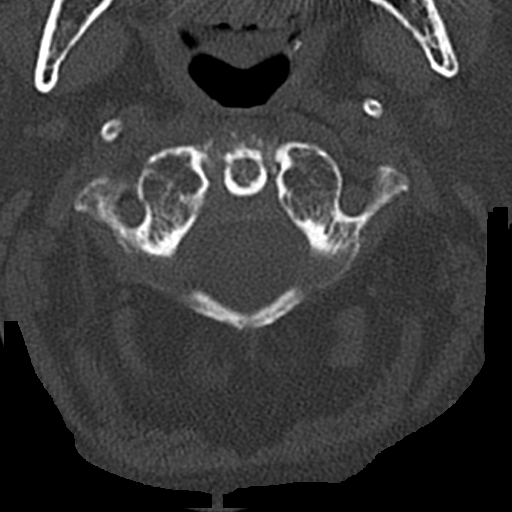
[im 83/90  bone]
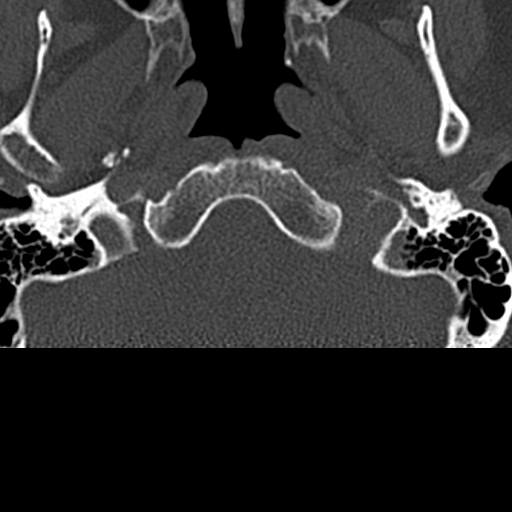

[18 of 47 positions shown; findings below may reference images not displayed]

FINDINGS: Brain: No evidence of acute infarction, hemorrhage, hydrocephalus,
extra-axial collection or mass lesion/mass effect. Periventricular
white matter hypodensity.

Vascular: No hyperdense vessel or unexpected calcification.

Skull: Normal. Negative for fracture or focal lesion.

Sinuses/Orbits: No acute finding.

Other: None.
IMPRESSION: No acute intracranial pathology. Small-vessel white matter disease.

## 2021-08-02 IMAGING — CR DG KNEE COMPLETE 4+V*R*
4 series · 4 of 4 positions shown · non-contrast
Comparison: May 20, 2019.

CLINICAL DATA: Left knee pain after fall today.

EXAM:
RIGHT KNEE - COMPLETE 4+ VIEW

[x knee ap right (1 of 3)]
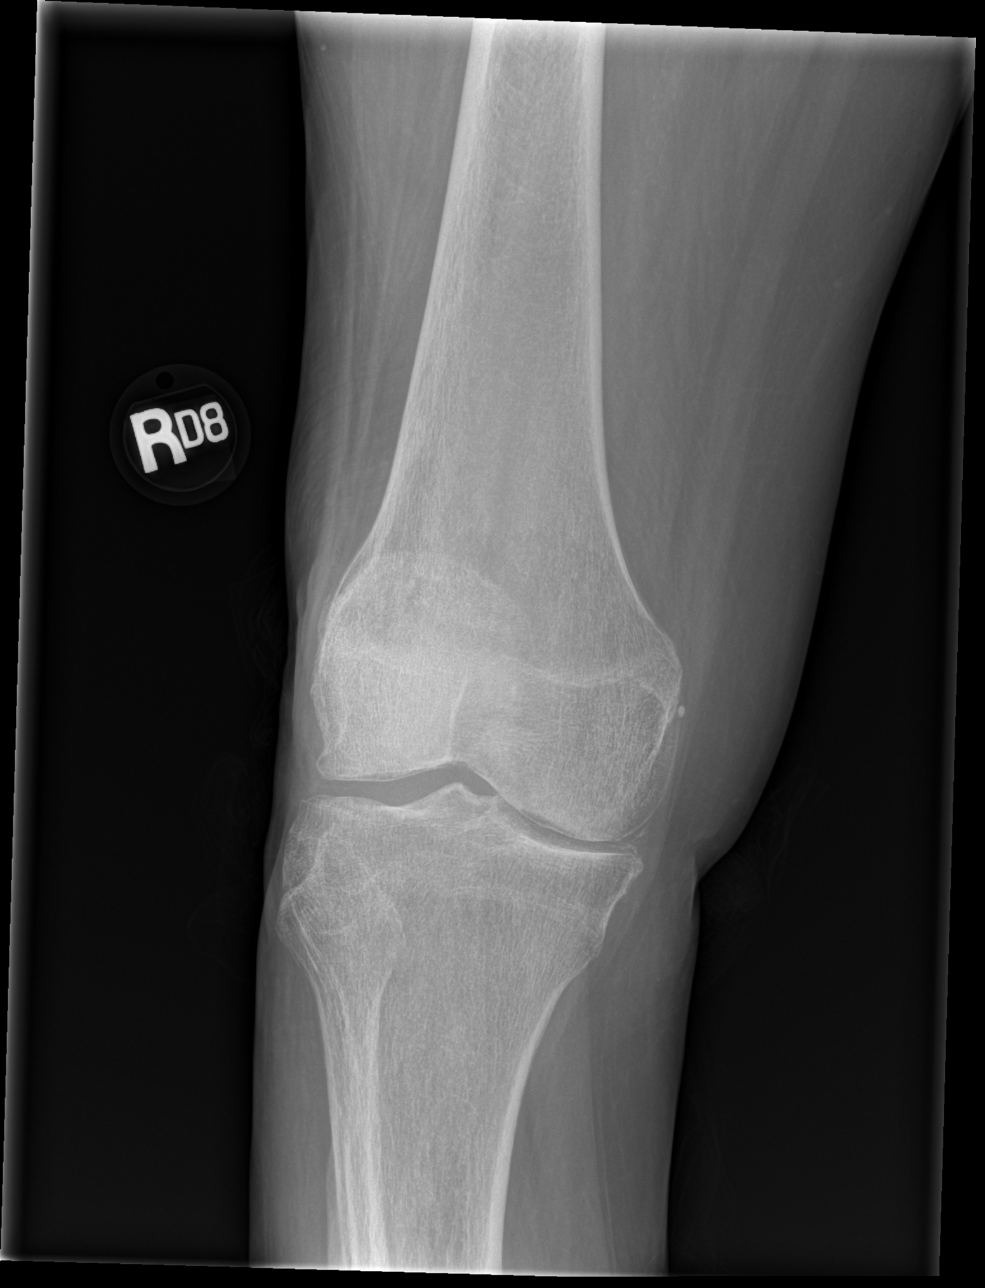

[x knee ap right (2 of 3)]
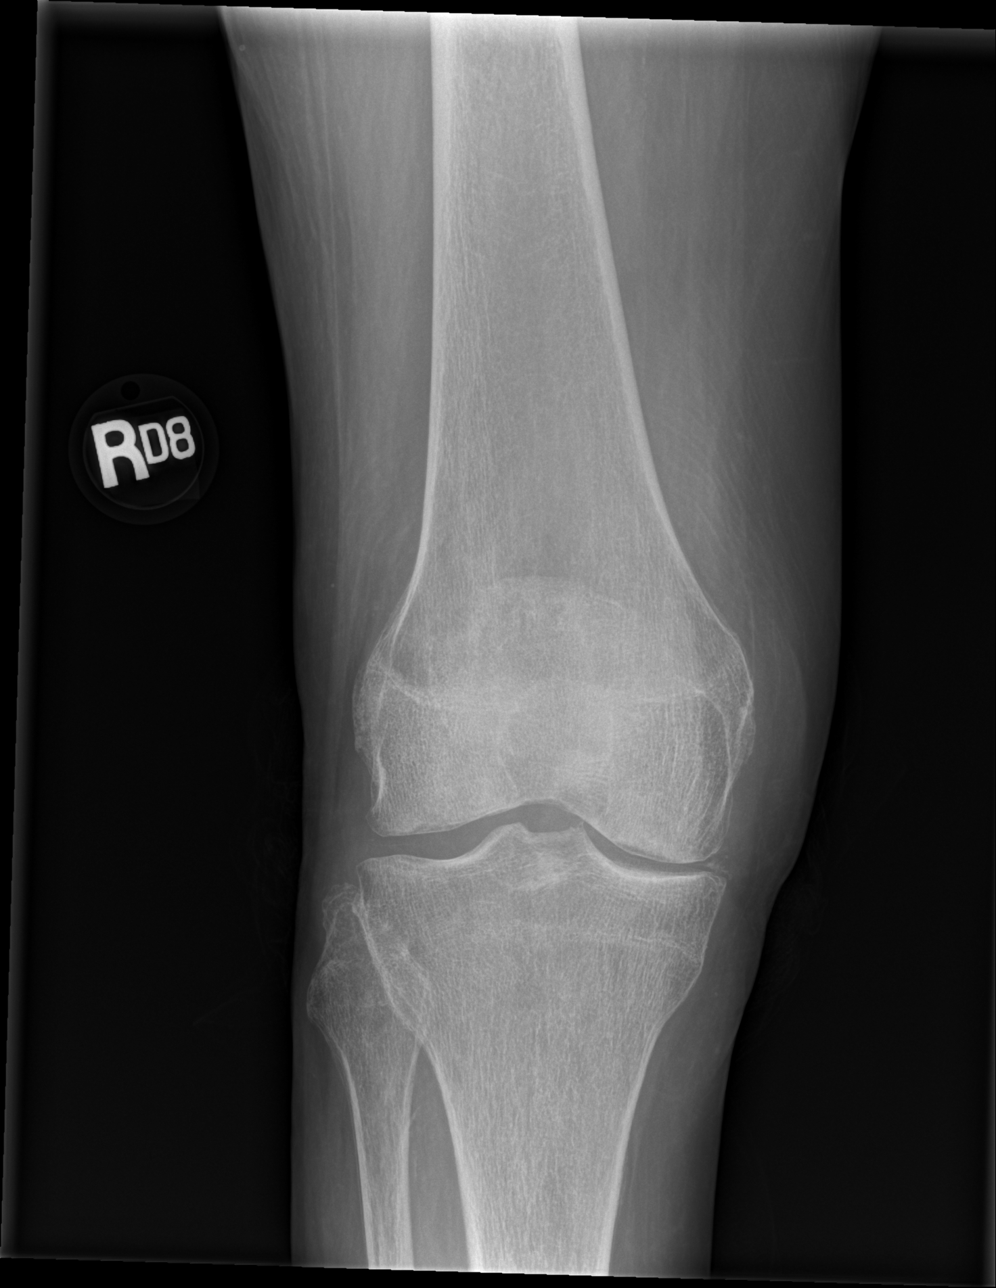

[x knee ap right (3 of 3)]
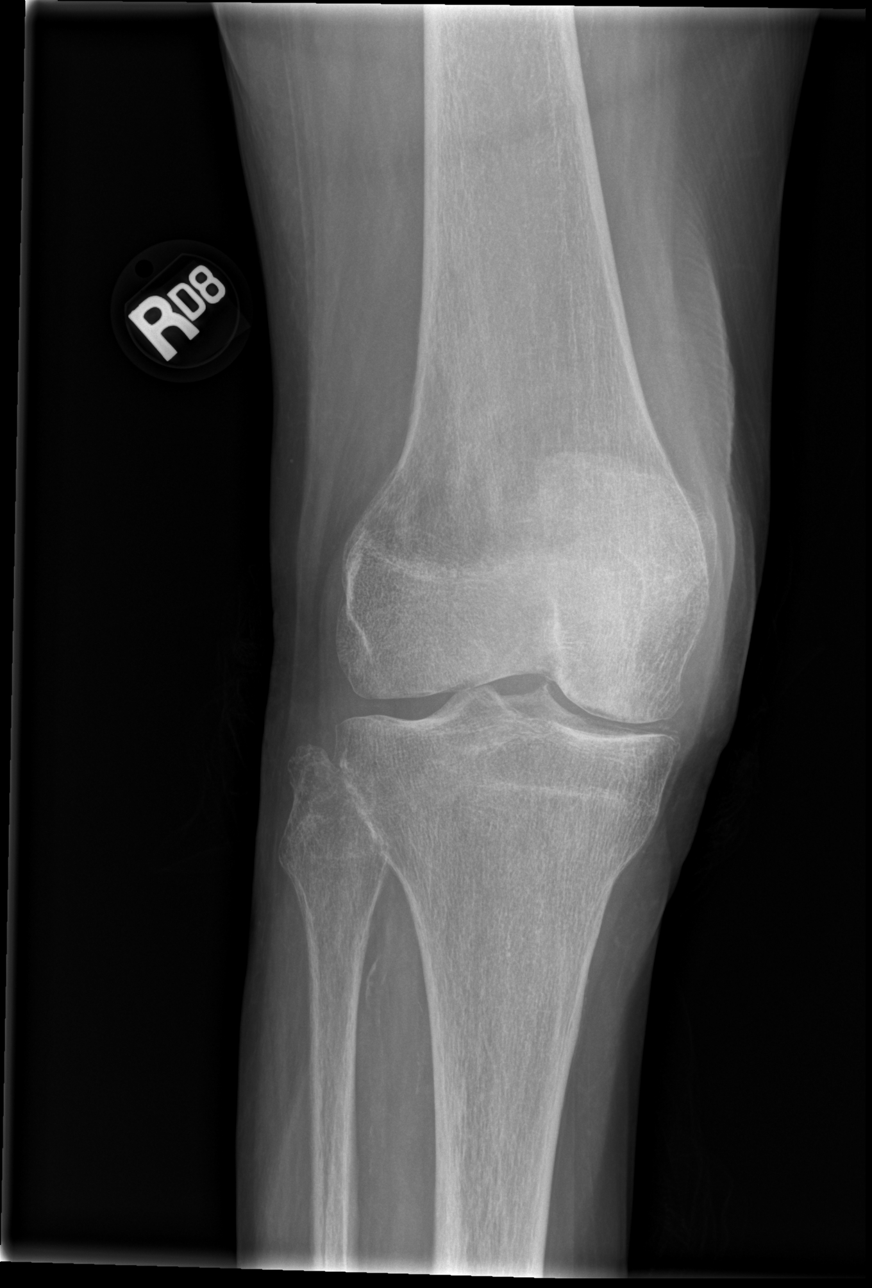

[x knee lat right]
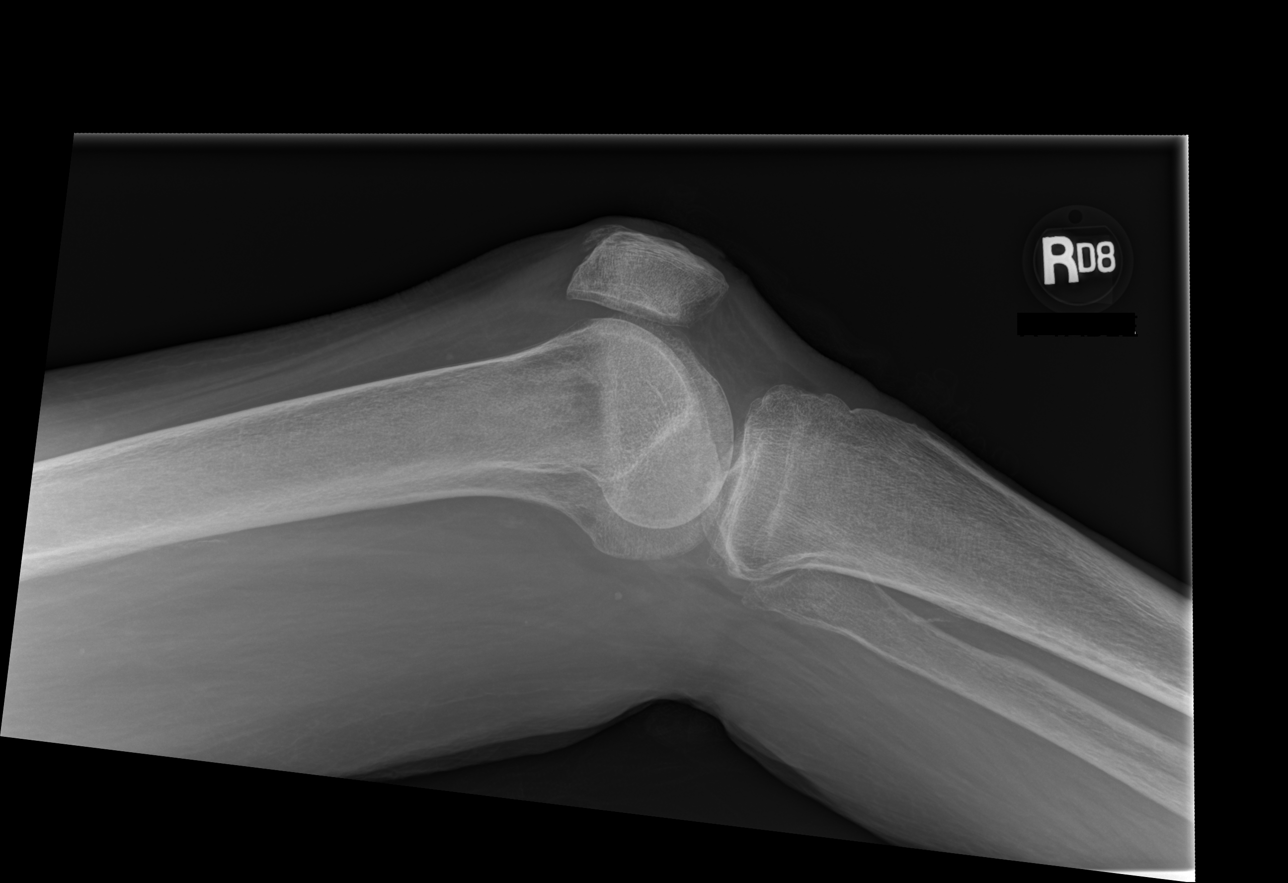

[4 of 4 positions shown; findings below may reference images not displayed]

FINDINGS: No evidence of fracture, dislocation, or joint effusion. Moderate
narrowing of medial joint space is noted. Soft tissues are
unremarkable.
IMPRESSION: Moderate degenerative joint disease is noted medially. No acute
abnormality seen in the right knee.

## 2021-08-02 IMAGING — CT CT CERVICAL SPINE W/O CM
4 of 7 series · 16 of 33 positions shown, 17 images · non-contrast
Comparison: None.

CLINICAL DATA: Status post fall in home.  Neck pain.

EXAM:
CT CERVICAL SPINE WITHOUT CONTRAST
TECHNIQUE: Multidetector CT imaging of the cervical spine was performed without
intravenous contrast. Multiplanar CT image reconstructions were also
generated.

[Series 5: coronal soft tissue · coronal · 0.29mm/px · 3 of 70 slices shown]
[im 18/70  bone]
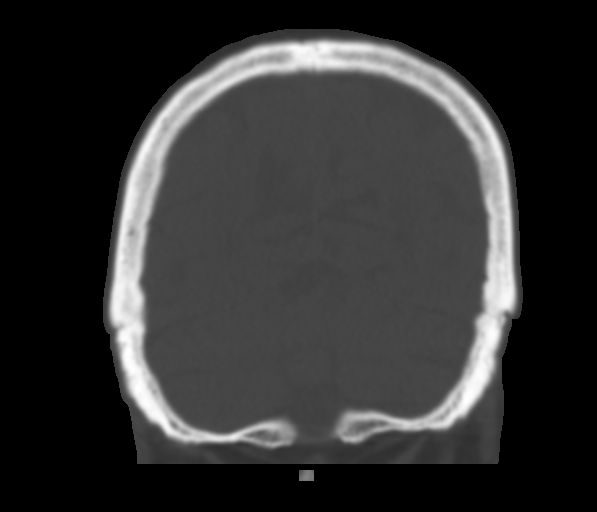
[im 35/70  bone]
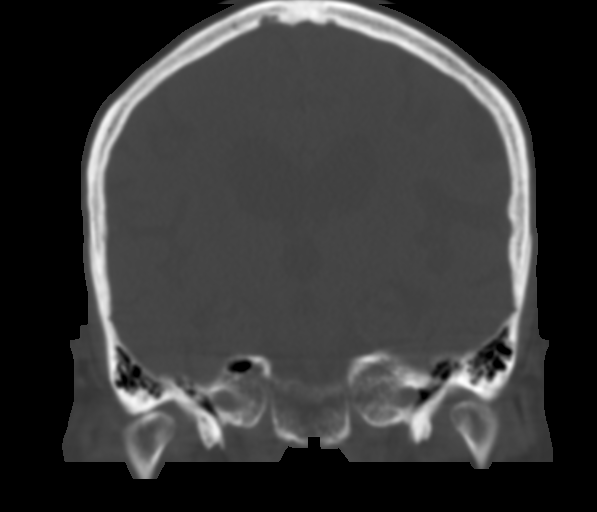
[im 52/70  bone]
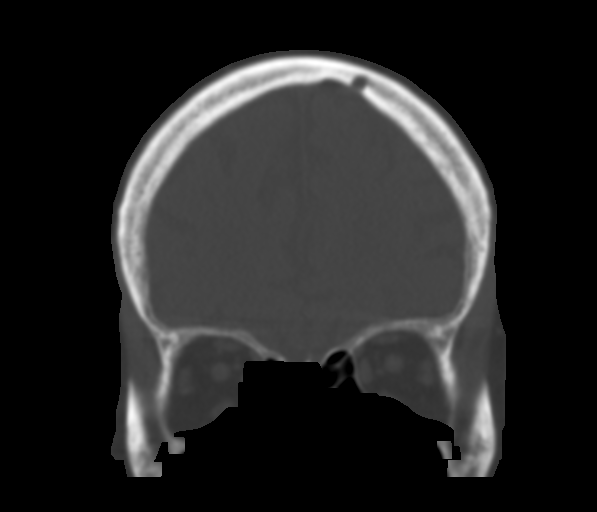

[Series 8: c spine soft · axial · 0.33mm/px · z∈[-218,-128]mm · 4 of 77 slices shown]
[im 16/77  soft-tissue]
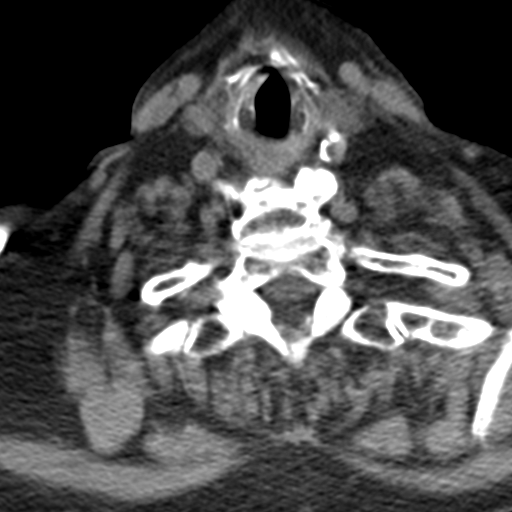
[im 31/77  soft-tissue]
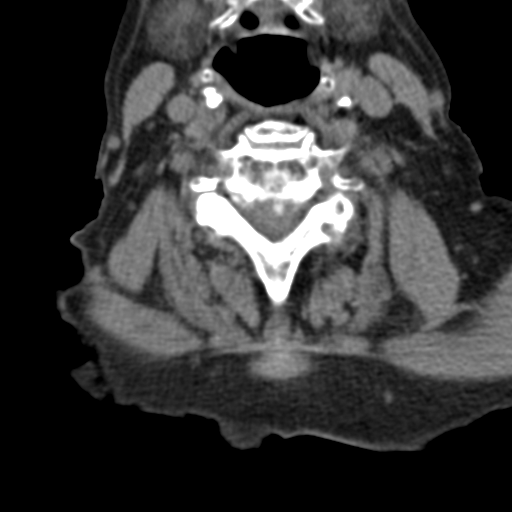
[im 46/77  soft-tissue]
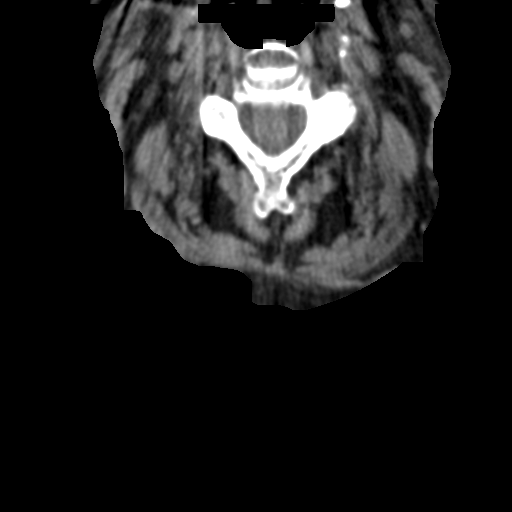
[im 61/77  soft-tissue]
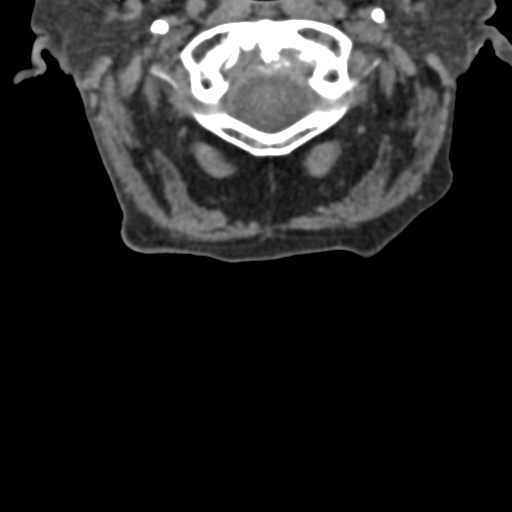

[Series 10: orthogonal bone · axial · 0.23mm/px · z∈[-247,-149]mm · 4 of 90 slices shown, 5 images]
[im 18/90  soft-tissue]
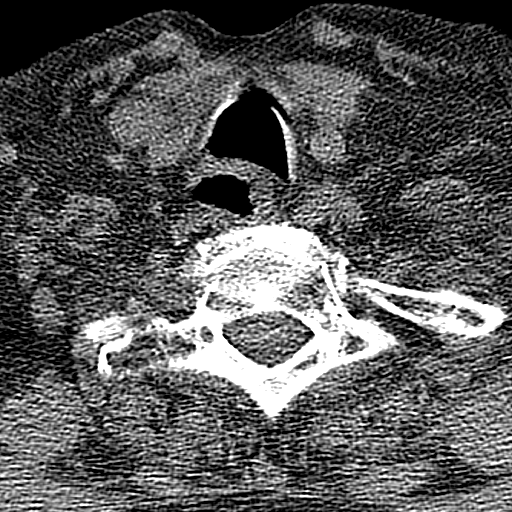
[im 18/90  bone]
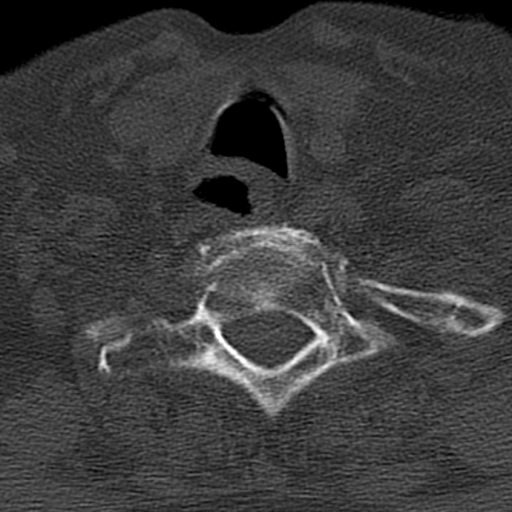
[im 36/90  bone]
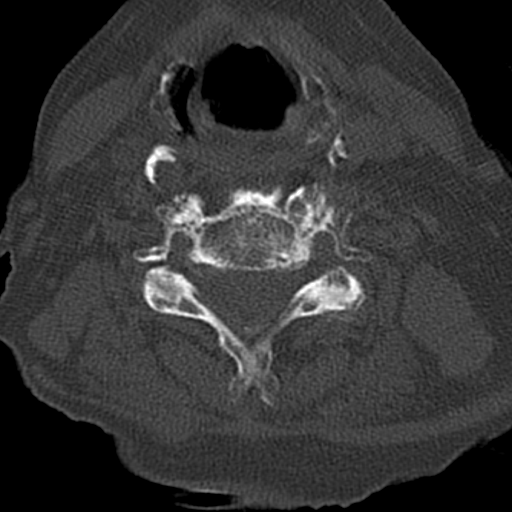
[im 54/90  bone]
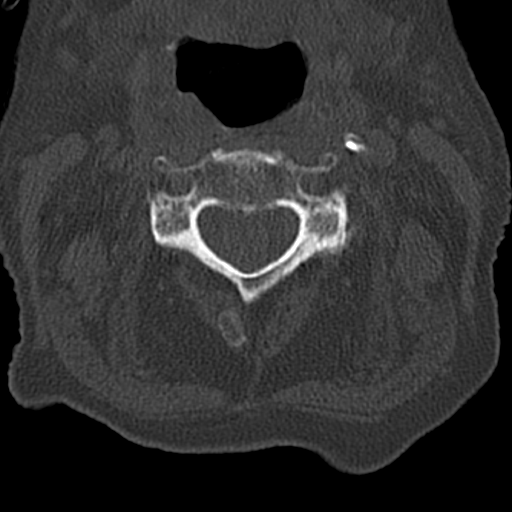
[im 72/90  bone]
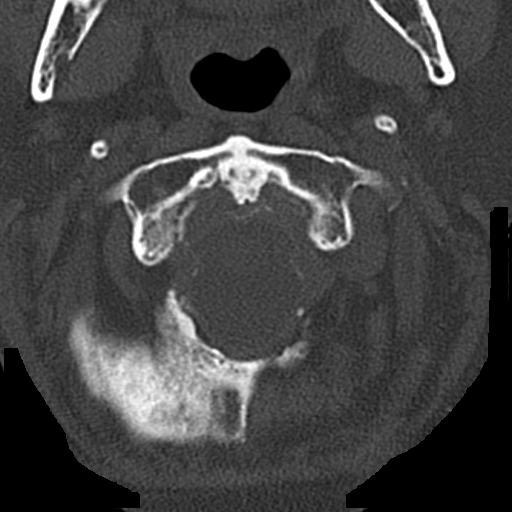

[Series 12: sagittal bone · sagittal · 0.29mm/px · 5 of 61 slices shown]
[im 11/61  bone]
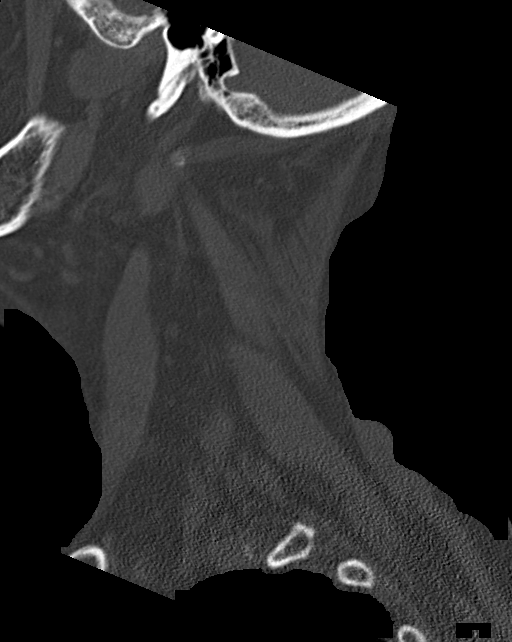
[im 21/61  bone]
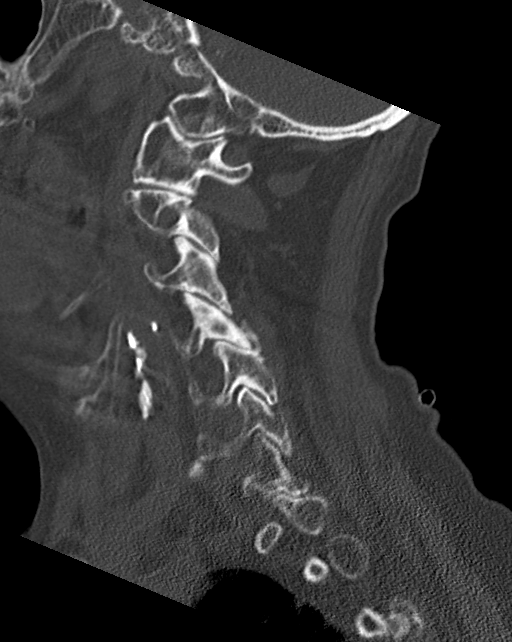
[im 31/61  bone]
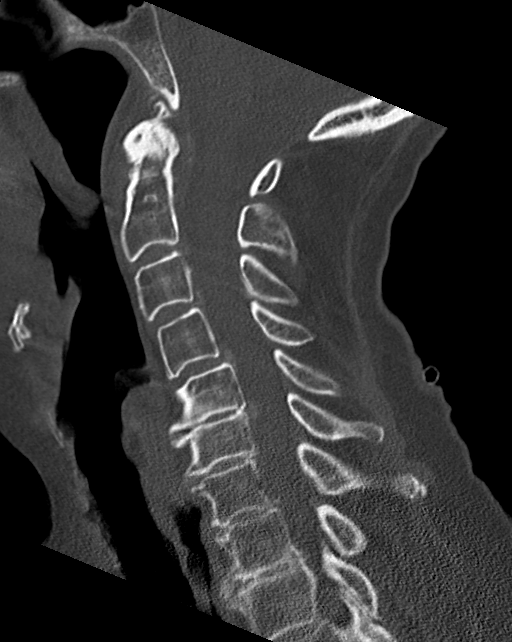
[im 41/61  bone]
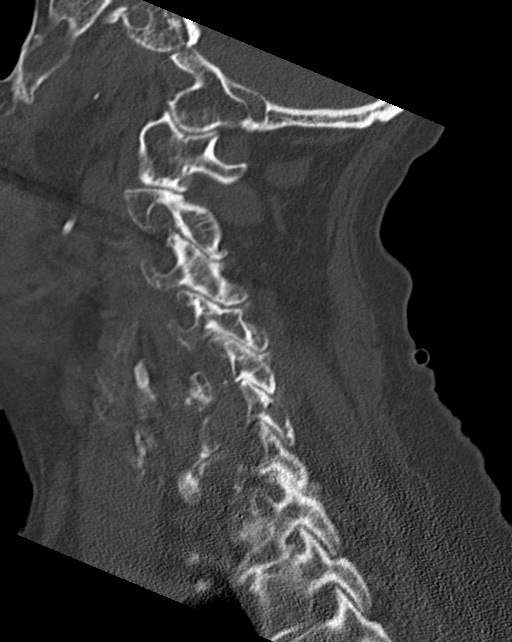
[im 51/61  bone]
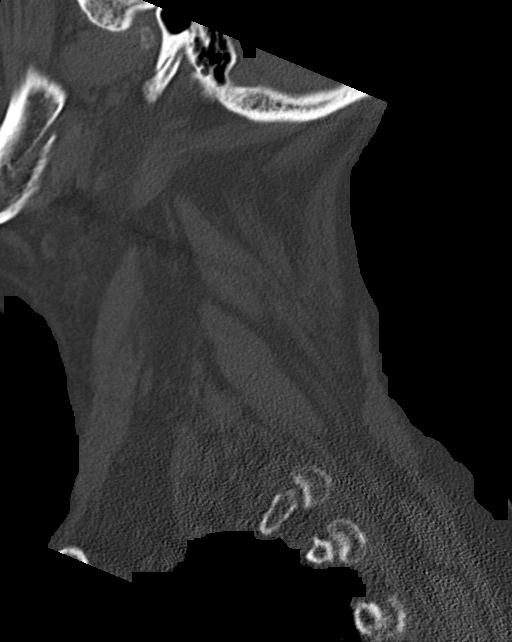

[16 of 33 positions shown; findings below may reference images not displayed]

FINDINGS: Alignment: 2 mm anterolisthesis of C4 on C5 secondary to facet
disease.

Skull base and vertebrae: No acute fracture. No primary bone lesion
or focal pathologic process.

Soft tissues and spinal canal: No prevertebral fluid or swelling. No
visible canal hematoma.

Disc levels: Degenerative disease with disc height loss at C5-6 and
C6-7 and to lesser extent C7-T1. Degenerative disease with severe
disc height loss T1-2. At C2-3 there is moderate left and mild right
facet arthropathy. At C3-4 there is moderate left and mild right
facet arthropathy. At C4-5 there is a mild broad-based disc bulge
and moderate bilateral facet arthropathy. Mild-moderate bilateral
foraminal stenosis at C4-5. At C5-6 there is a broad-based disc
osteophyte complex with a central disc protrusion, bilateral
uncovertebral degenerative changes, bilateral foraminal narrowing
and bilateral facet arthropathy. At C6-7 there is a broad-based disc
osteophyte complex with bilateral mild facet arthropathy and mild
right foraminal stenosis. At C7-T1 there is moderate bilateral facet
arthropathy and mild bilateral foraminal stenosis. At T1-2 there is
bilateral facet arthropathy, bilateral uncovertebral degenerative
changes and bilateral foraminal stenosis.

Upper chest: Lung apices are clear.  Biapical emphysematous changes.

Other: Bilateral carotid artery atherosclerosis.
IMPRESSION: 1. No acute osseous injury of the cervical spine.
2. Cervical spine spondylosis as described above.

## 2021-10-05 IMAGING — DX DG CHEST 2V
2 series · 2 of 2 positions shown · non-contrast
Comparison: 11/26/2019

CLINICAL DATA: COPD exacerbation

EXAM:
CHEST - 2 VIEW

[chest ap]
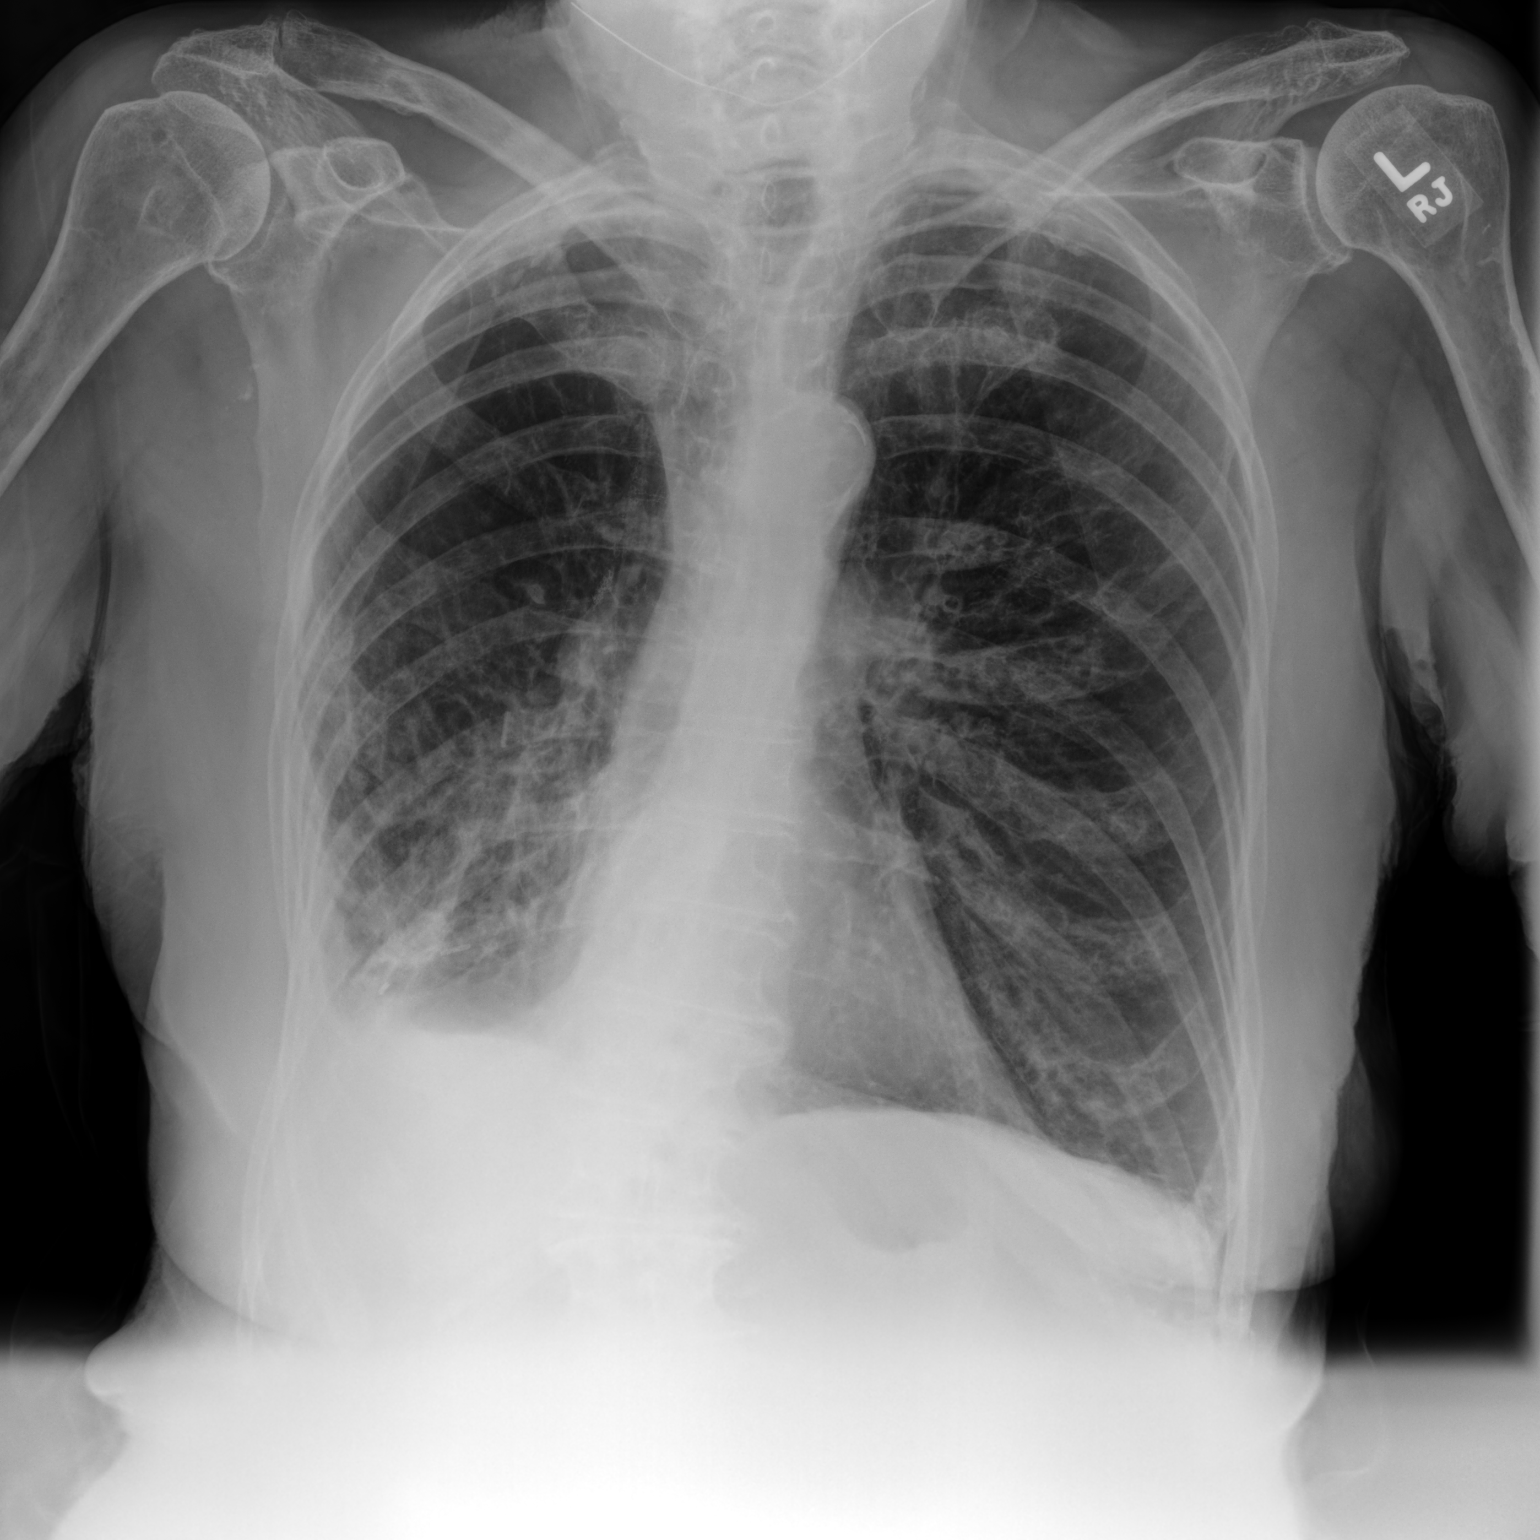

[chest lat]
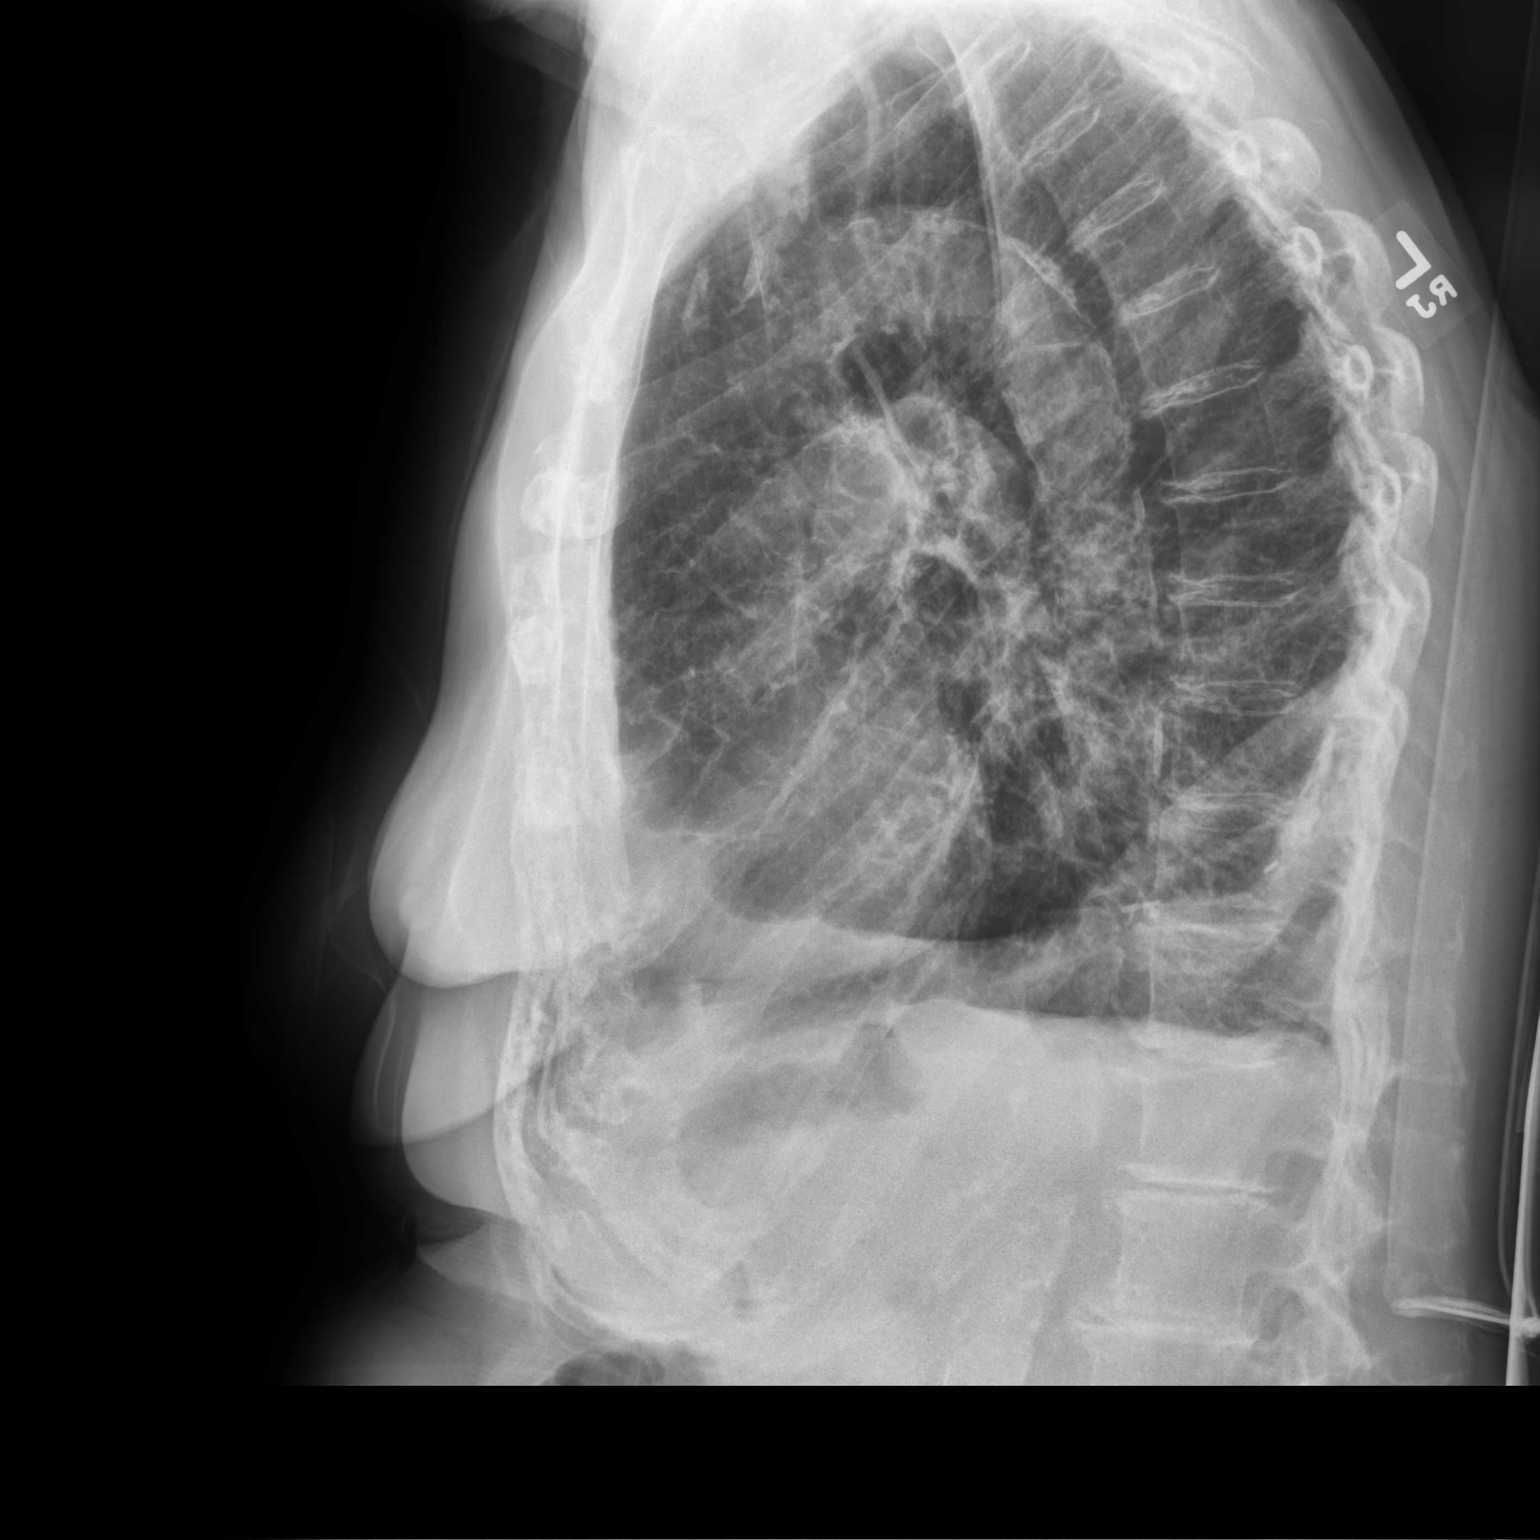

[2 of 2 positions shown; findings below may reference images not displayed]

FINDINGS: Right-sided volume loss is again seen with right basilar pleural
thickening or loculated pleural fluid. Focal nodular opacity at the
right lung base appears improved since prior examination, likely
representing combination of scarring and pleural calcification noted
on prior CT examination. There has, however, developed a right
basilar interstitial infiltrate, possibly infectious or inflammatory
in the acute setting. No pneumothorax. No pleural effusion on the
left. Pulmonary hyperinflation is stable. Cardiac size within normal
limits. No acute bone abnormality.
IMPRESSION: Interval development of right basilar focal pulmonary infiltrate,
possibly infectious in the appropriate clinical setting.
Superimposed right-sided volume loss and right basilar pleural
thickening, unchanged. COPD.

## 2021-10-31 IMAGING — CR DG CHEST 2V
2 series · 2 of 2 positions shown · non-contrast
Comparison: 03/12/2020 chest radiographs and earlier.

CLINICAL DATA: 86-year-old female with shortness of breath.  COPD.

EXAM:
CHEST - 2 VIEW

[w chest lat]
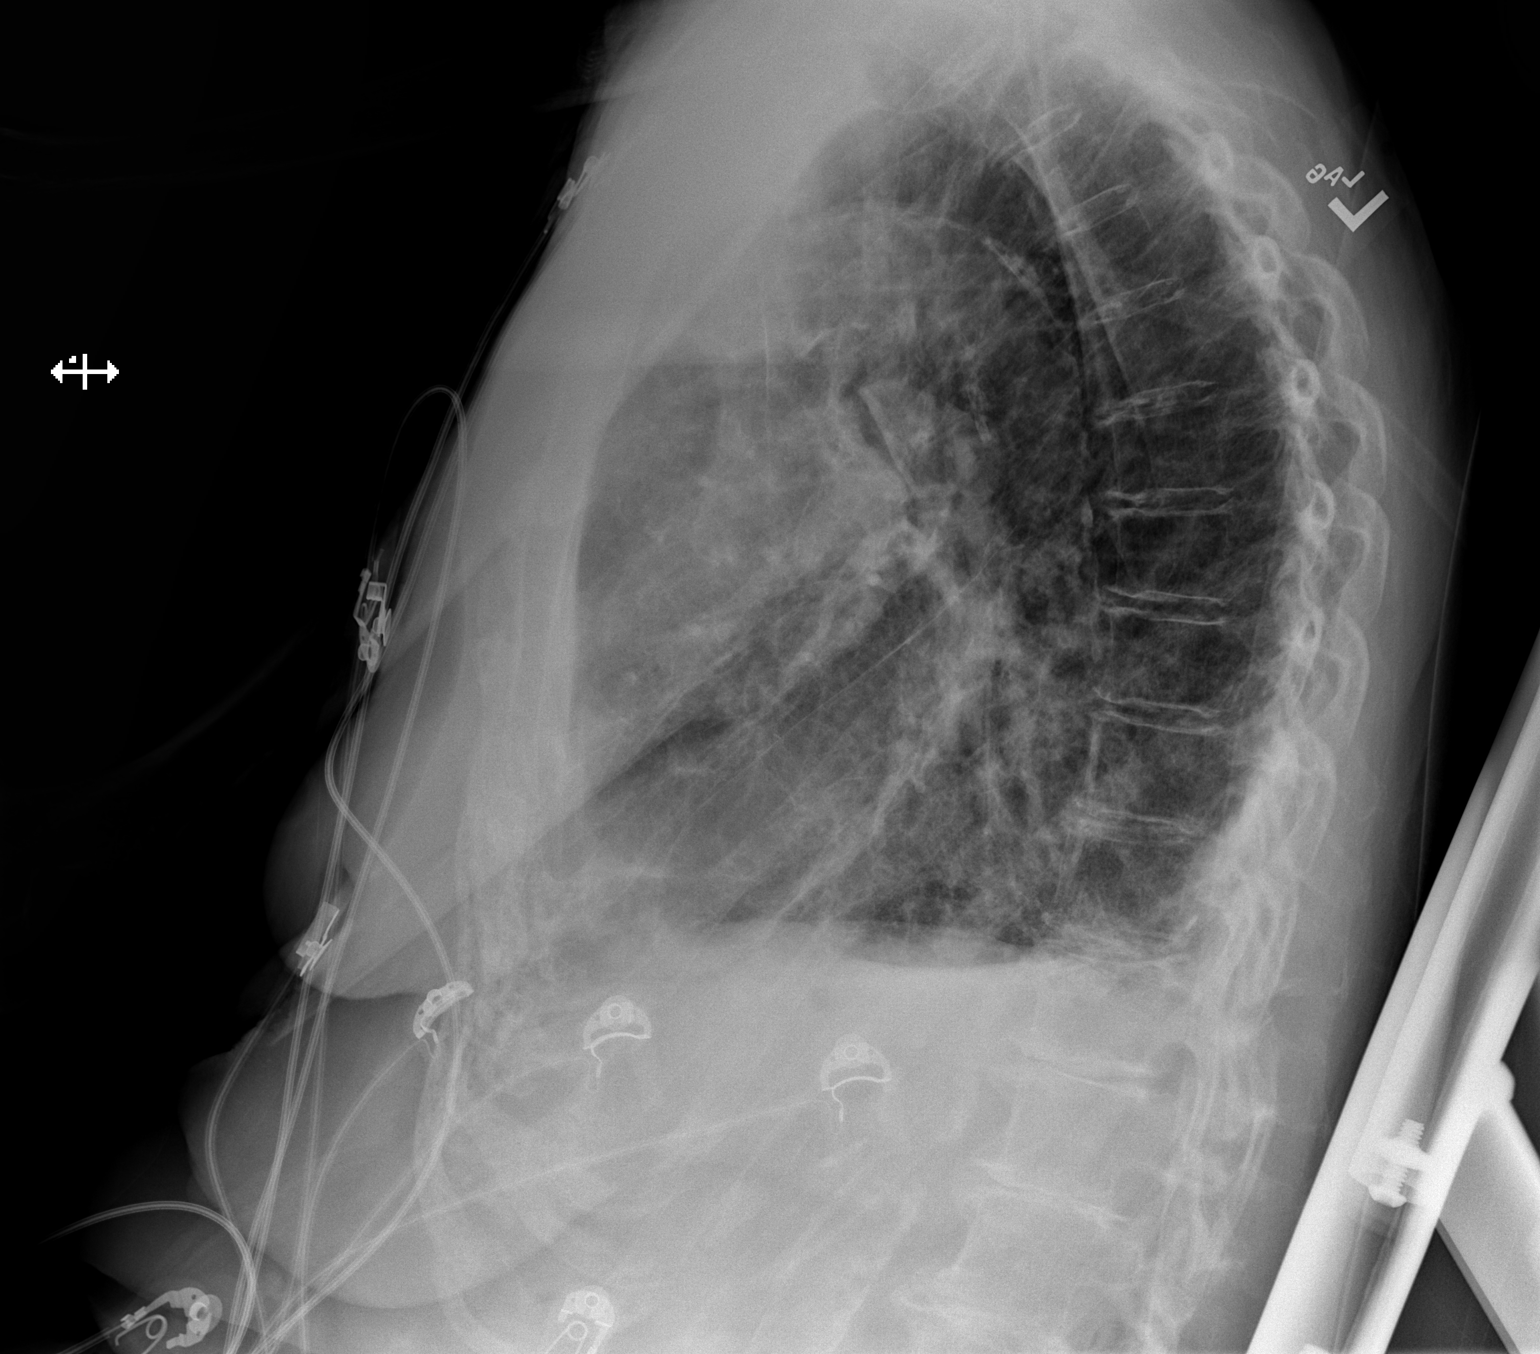

[x chest ap]
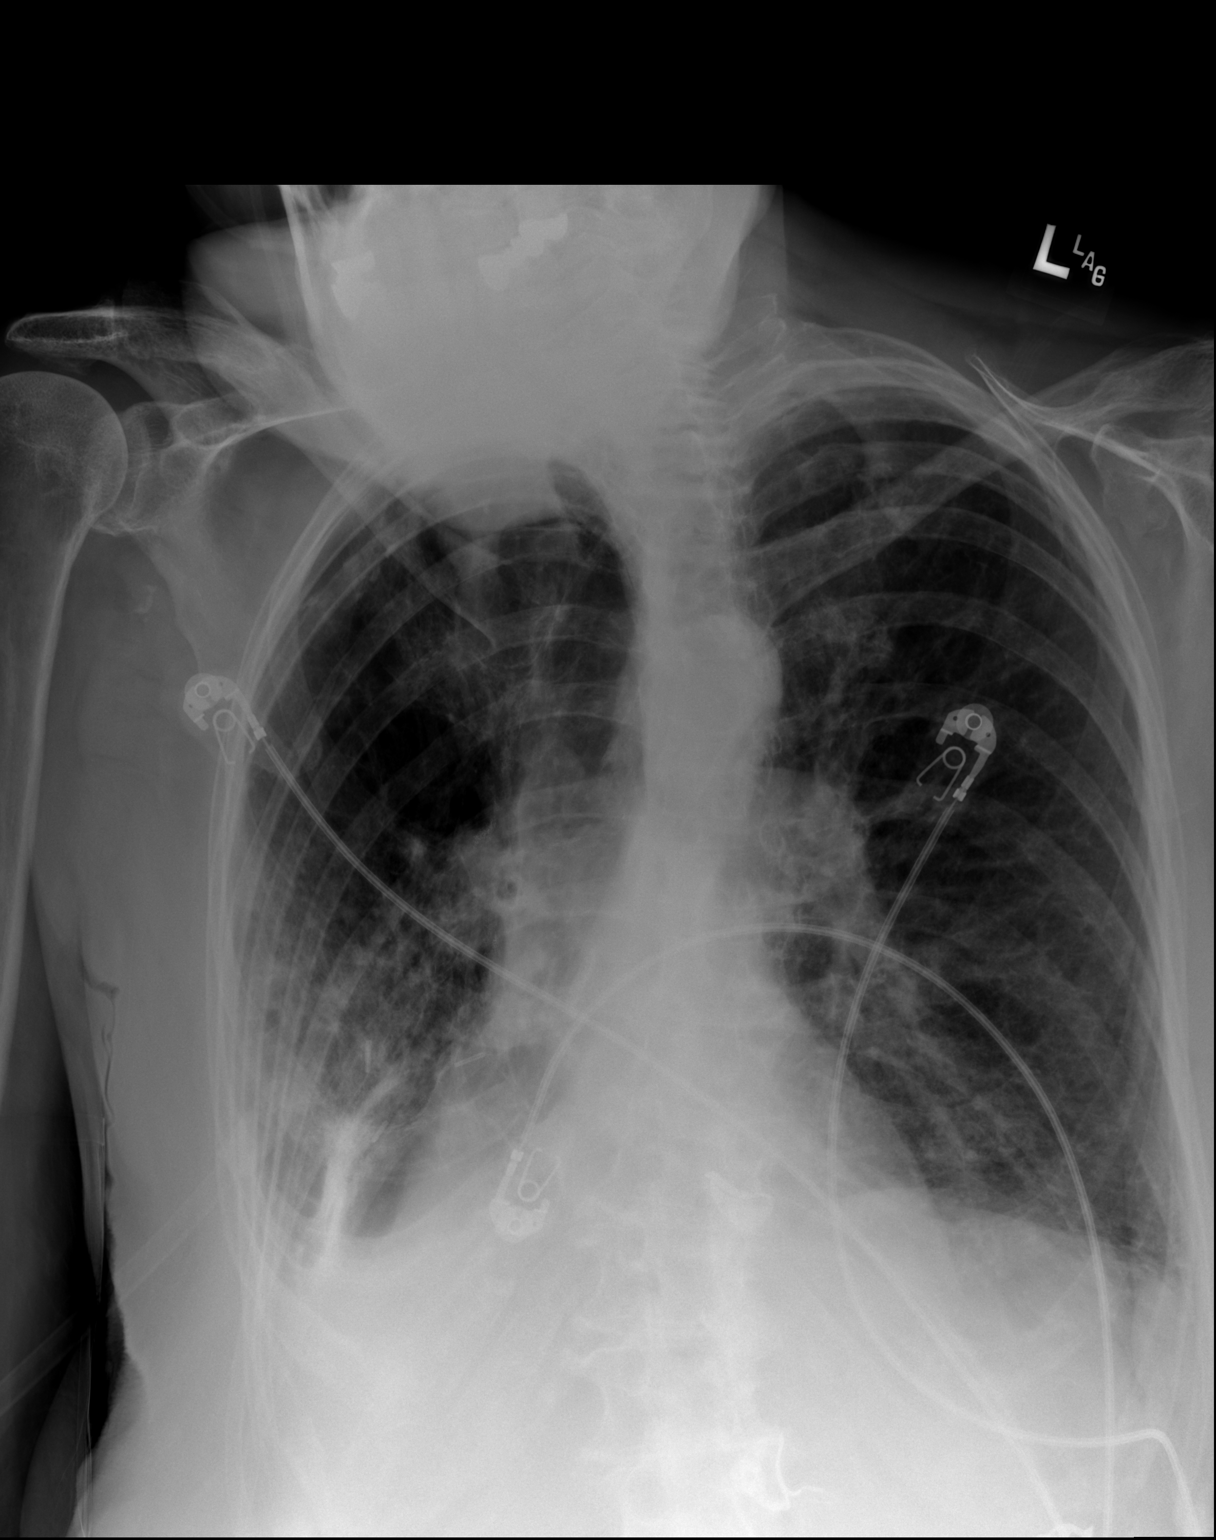

[2 of 2 positions shown; findings below may reference images not displayed]

FINDINGS: Upright AP and lateral views. Chronic hyperinflation plus
architectural distortion at the right lung base with fibrothorax.
Right lung base calcified pleural plaques demonstrated by CT in
0855. Mediastinal contours are stable. No pneumothorax.

But there is increased reticulonodular opacity in the right lower
lung compared to [REDACTED]. Left lung appears stable.

Calcified aortic atherosclerosis. Osteopenia. No acute osseous
abnormality identified. Paucity of bowel gas in the upper abdomen.
IMPRESSION: 1. Chronic lung disease with increased reticulonodular opacity in
the right lower lung compatible with acute infectious exacerbation.
2. Aortic Atherosclerosis (LDD8L-6C7.7) and Emphysema (LDD8L-QFH.7).

## 2021-10-31 IMAGING — CT CT HEAD W/O CM
2 of 6 series · 9 of 47 positions shown, 11 images · non-contrast
Comparison: 01/08/2020

CLINICAL DATA: Nontraumatic seizure

EXAM:
CT HEAD WITHOUT CONTRAST
TECHNIQUE: Contiguous axial images were obtained from the base of the skull
through the vertex without intravenous contrast.

[Series 2: head wo · axial · 0.47mm/px · z∈[+1548,+1678]mm · 6 of 32 slices shown, 8 images]
[im 3/32  brain]
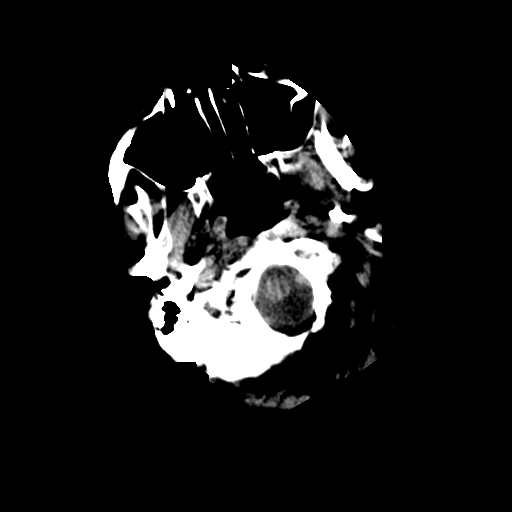
[im 3/32  bone]
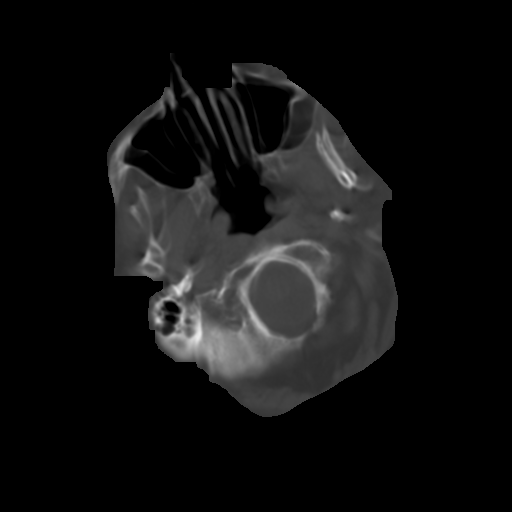
[im 8/32  brain]
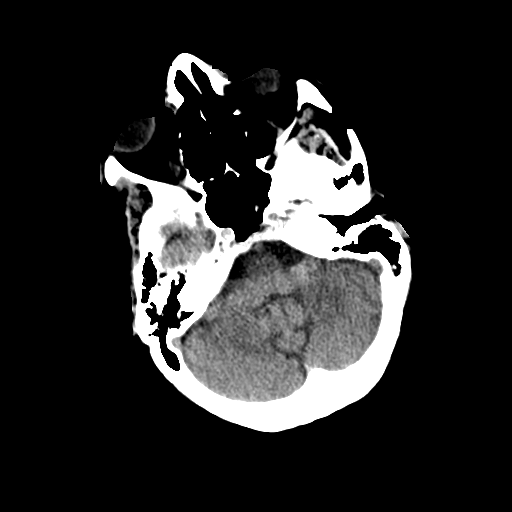
[im 13/32  brain]
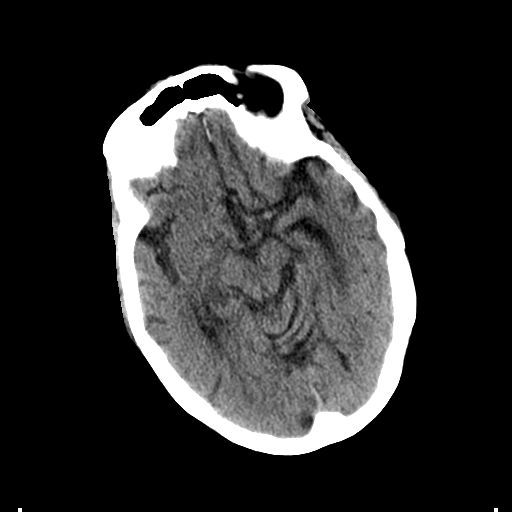
[im 19/32  brain]
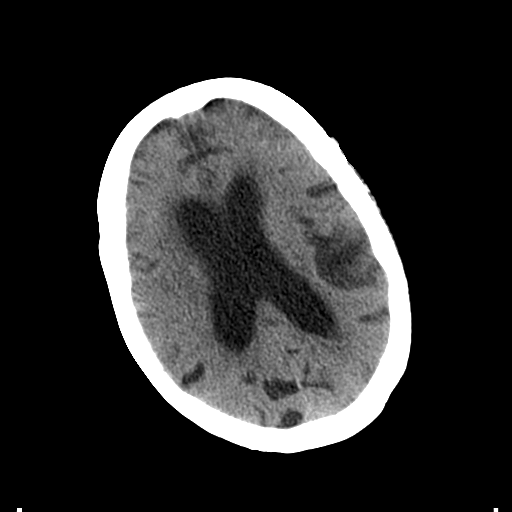
[im 24/32  brain]
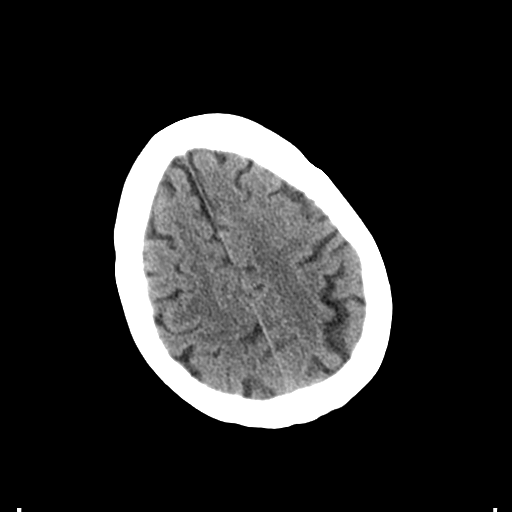
[im 24/32  bone]
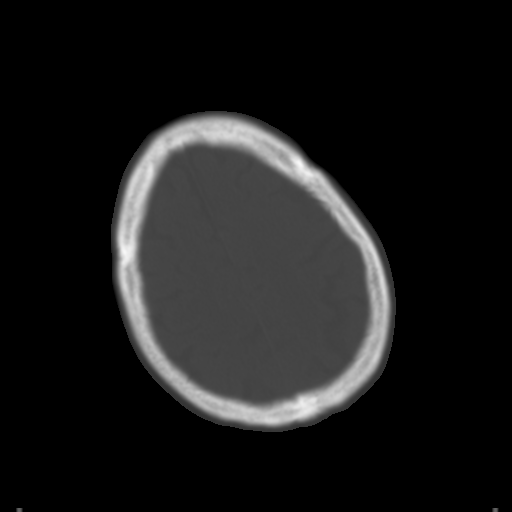
[im 29/32  brain]
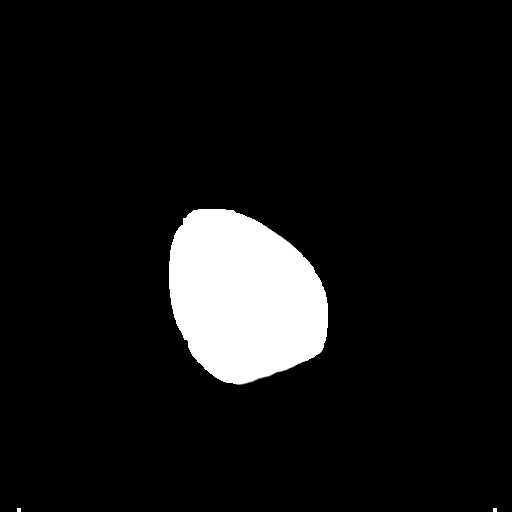

[Series 8: coronal soft tissue · coronal · 0.10mm/px · 3 of 62 slices shown]
[im 18/62  brain]
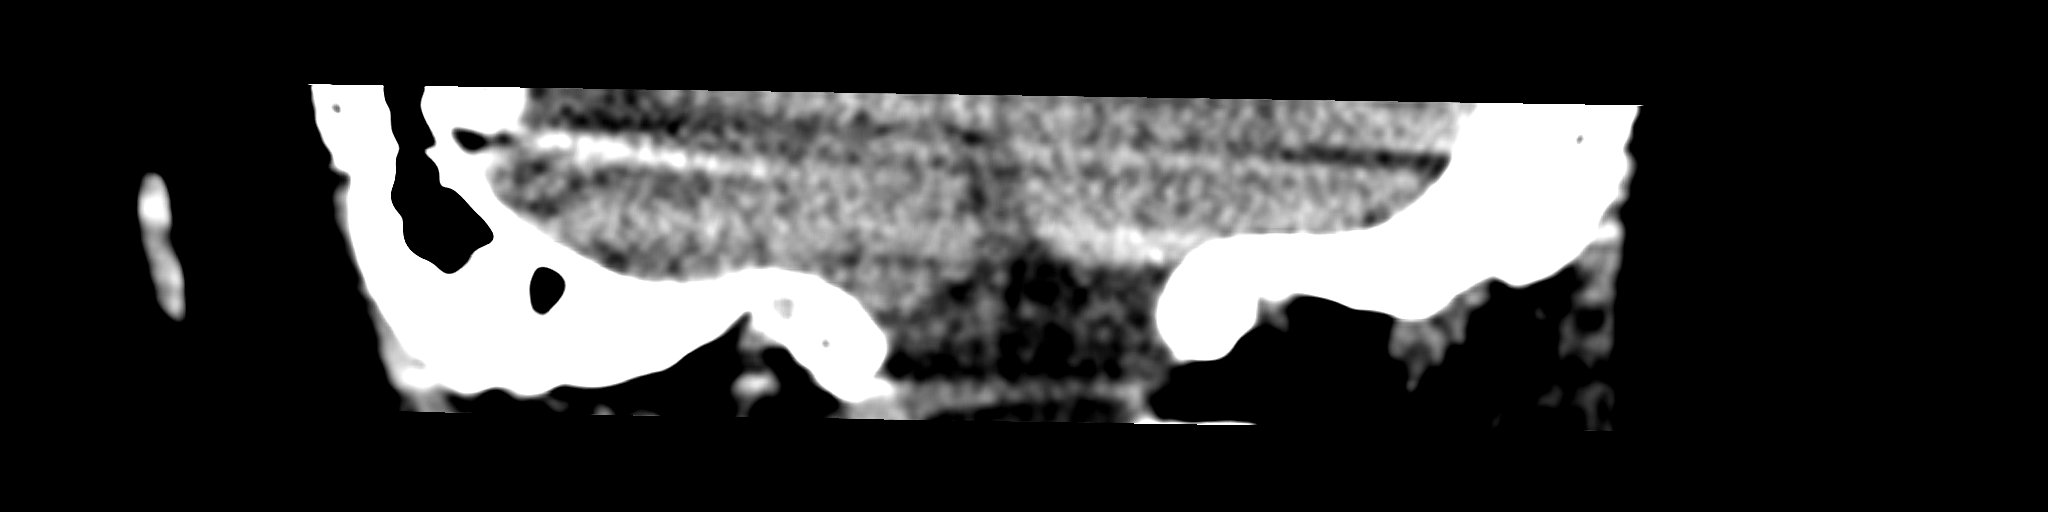
[im 27/62  brain]
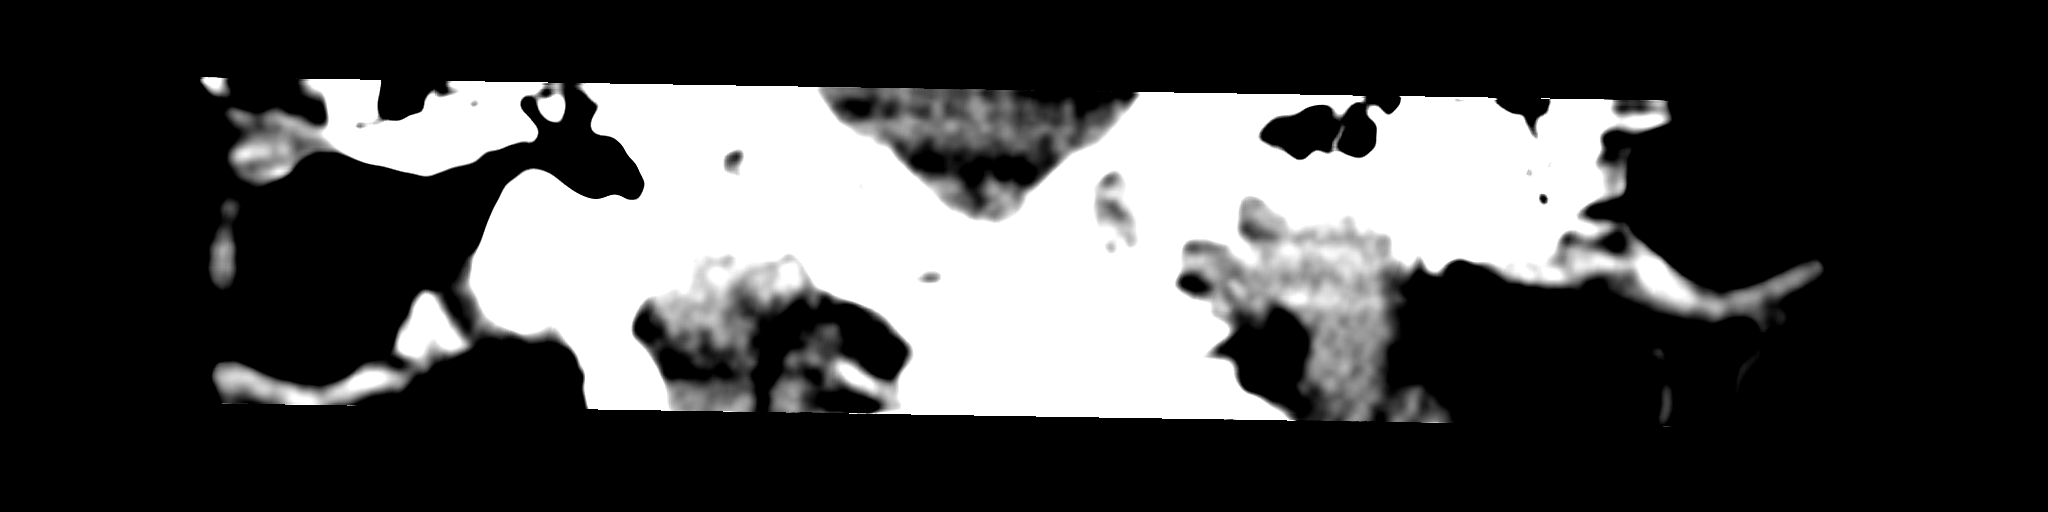
[im 35/62  brain]
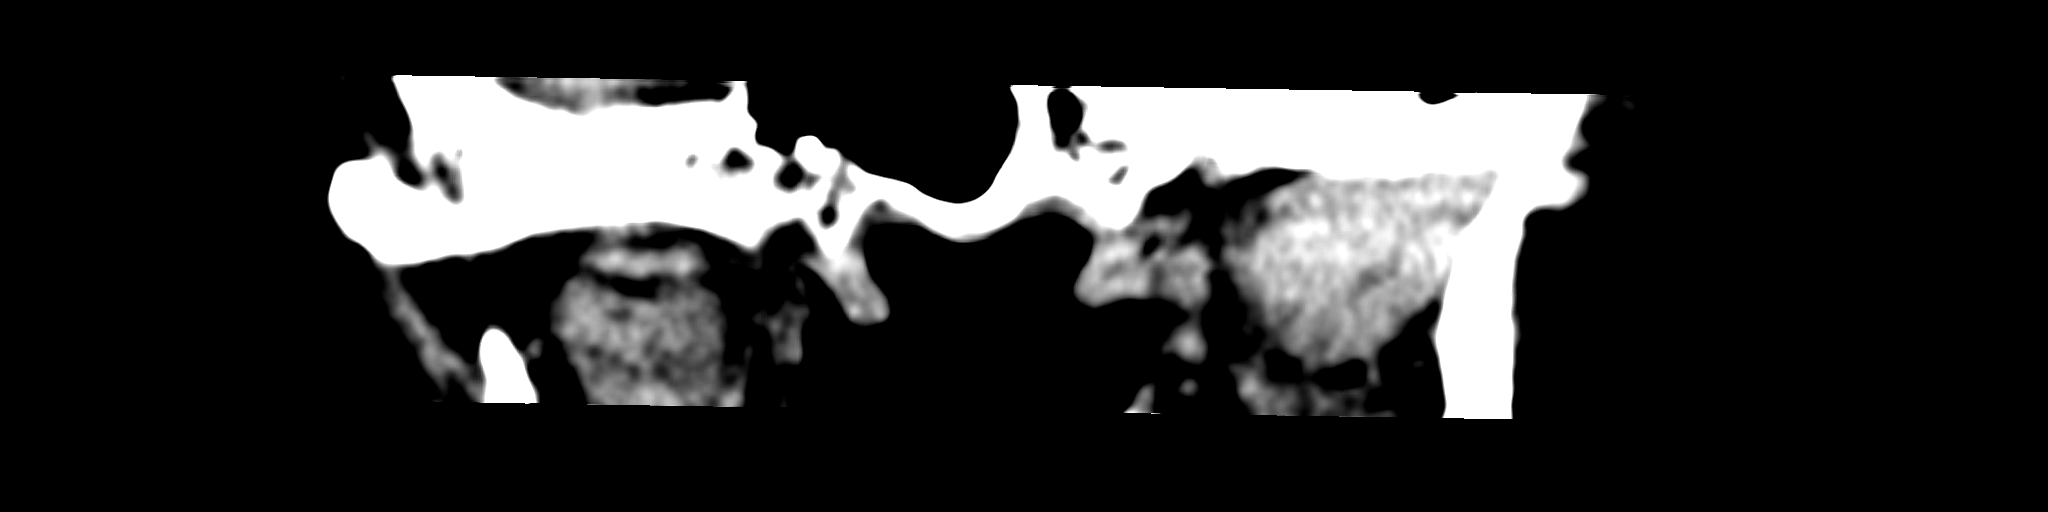

[9 of 47 positions shown; findings below may reference images not displayed]

FINDINGS: Brain: No evidence of acute infarction, hemorrhage, hydrocephalus,
extra-axial collection or mass lesion/mass effect. Generalized
atrophy with chronic small vessel ischemic low-density in the deep
white matter.

Vascular: No hyperdense vessel or unexpected calcification.

Skull: Normal. Negative for fracture or focal lesion.

Sinuses/Orbits: No acute finding.  Bilateral cataract resection.

Other: Motion degraded.
IMPRESSION: 1. Aging brain without acute superimposed finding.
2. Intermittent motion artifact.

## 2021-10-31 IMAGING — CT CT ABD-PELV W/ CM
2 of 5 series · 16 of 46 positions shown, 18 images · IV contrast (OMNIPAQUE 350)
Comparison: 01/01/2020

CLINICAL DATA: Epigastric pain

EXAM:
CT ABDOMEN AND PELVIS WITH CONTRAST
TECHNIQUE: Multidetector CT imaging of the abdomen and pelvis was performed
using the standard protocol following bolus administration of
intravenous contrast.
CONTRAST:  100mL OMNIPAQUE IOHEXOL 350 MG/ML SOLN

[Series 2: axial st · axial · 0.75mm/px · z∈[-567,-232]mm · 13 of 79 slices shown, 15 images]
[im 6/79  soft-tissue]
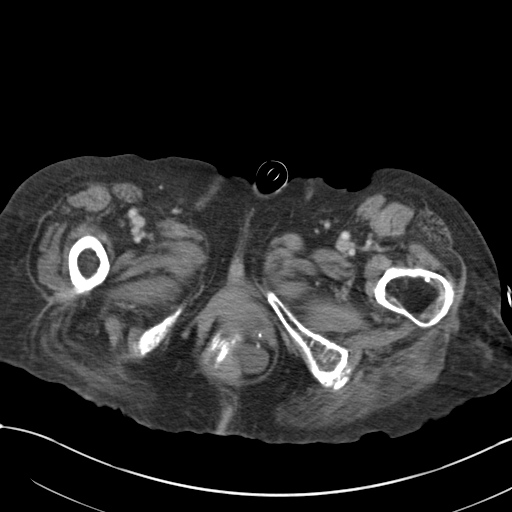
[im 6/79  bone]
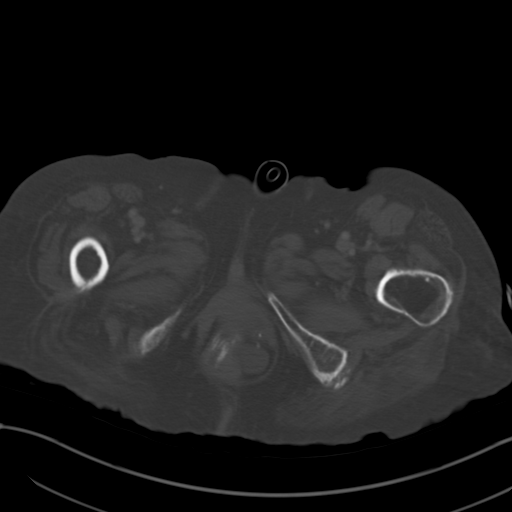
[im 12/79  soft-tissue]
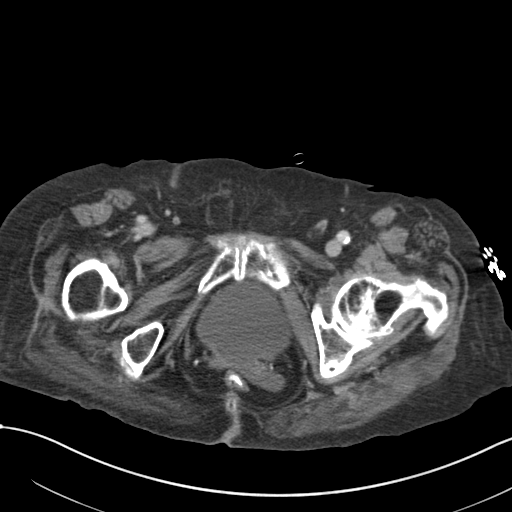
[im 17/79  soft-tissue]
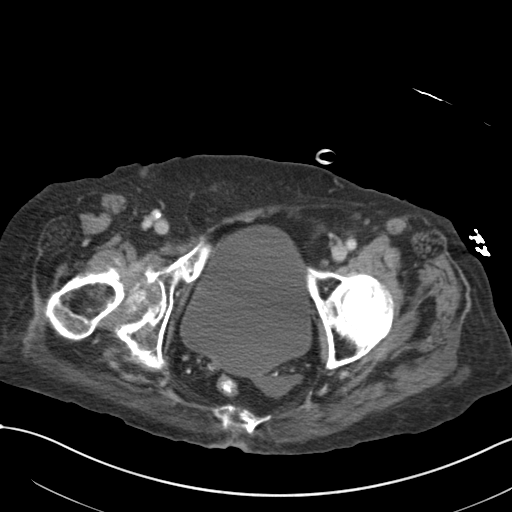
[im 23/79  soft-tissue]
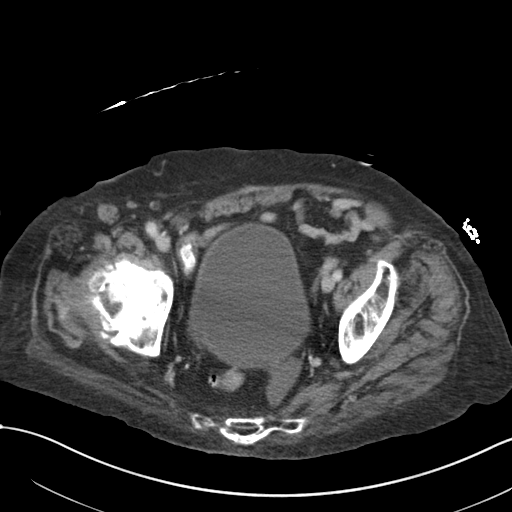
[im 28/79  soft-tissue]
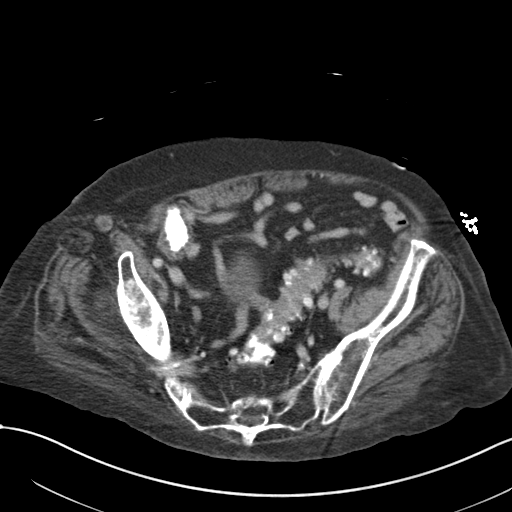
[im 34/79  soft-tissue]
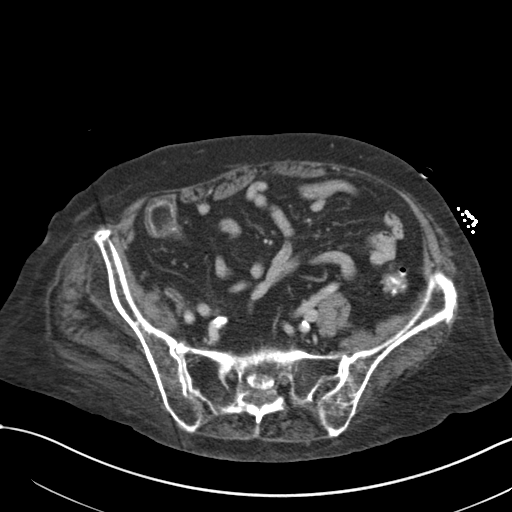
[im 40/79  soft-tissue]
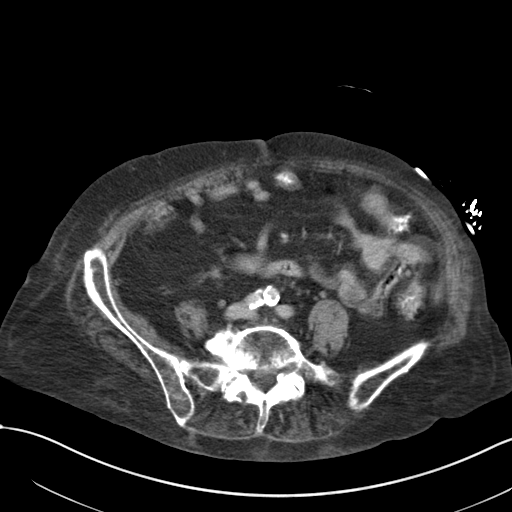
[im 45/79  soft-tissue]
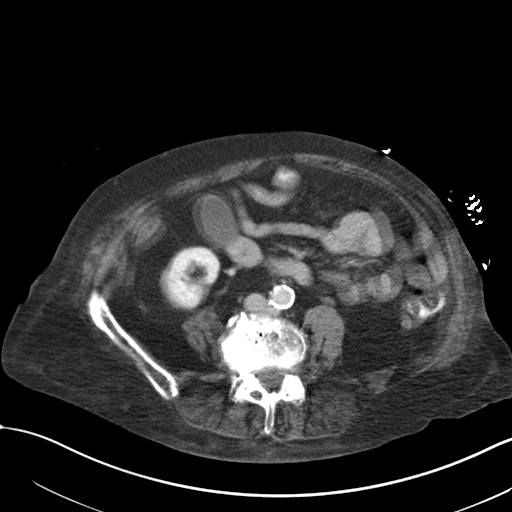
[im 51/79  soft-tissue]
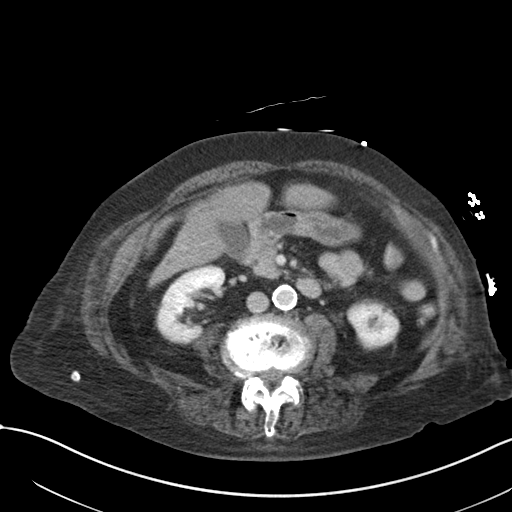
[im 51/79  bone]
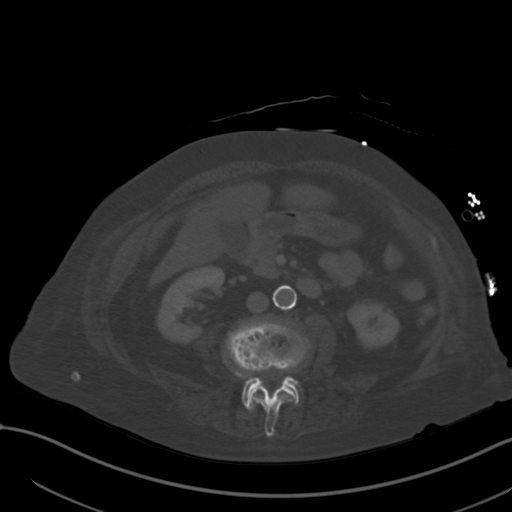
[im 56/79  soft-tissue]
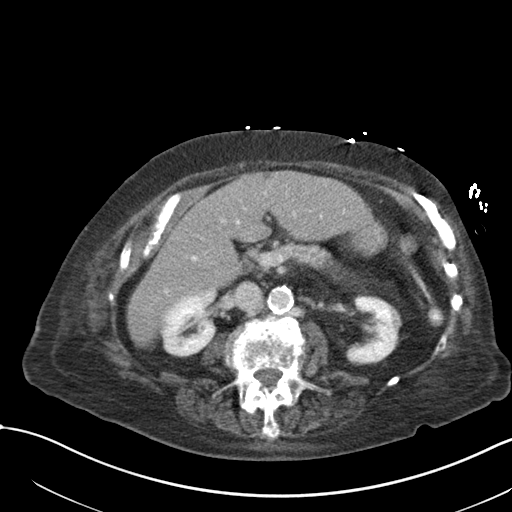
[im 62/79  soft-tissue]
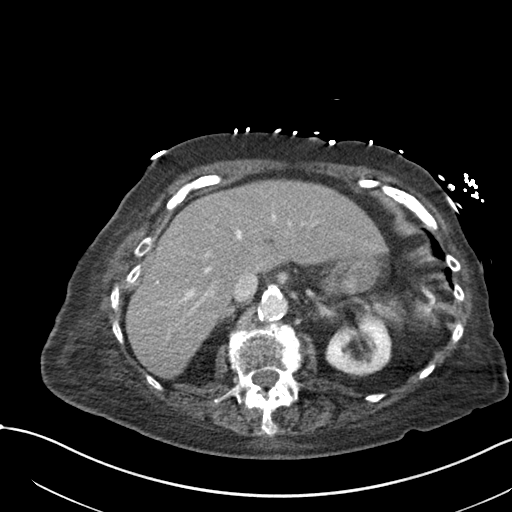
[im 67/79  soft-tissue]
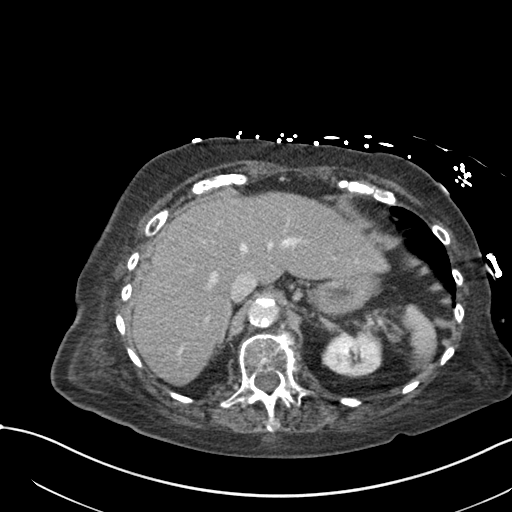
[im 73/79  soft-tissue]
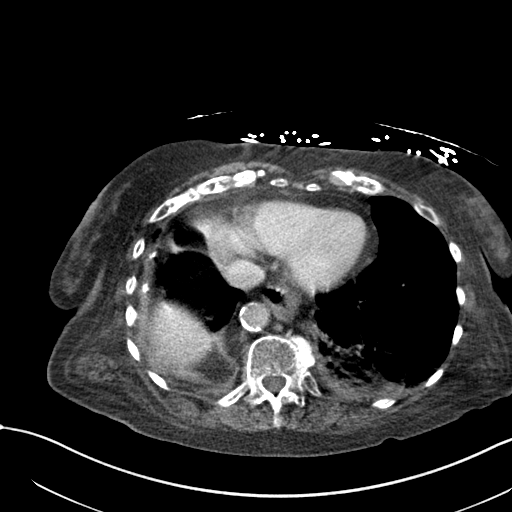

[Series 4: coronal st · coronal · 0.63mm/px · 3 of 80 slices shown]
[im 27/80  soft-tissue]
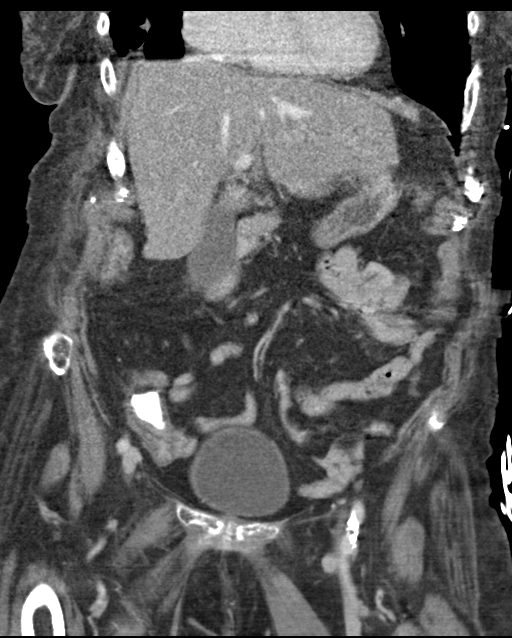
[im 36/80  soft-tissue]
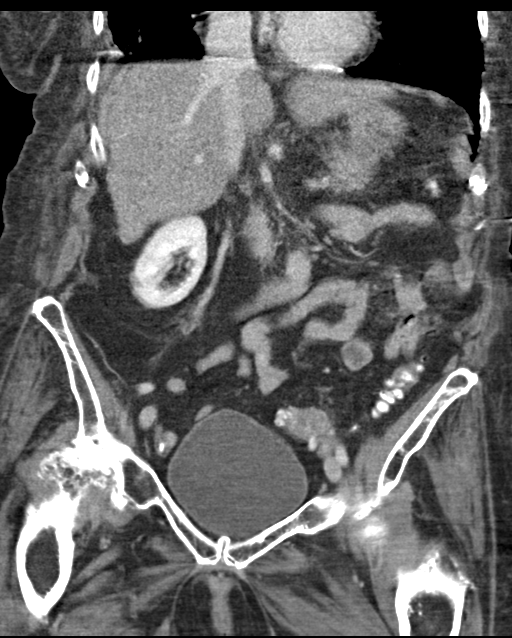
[im 44/80  soft-tissue]
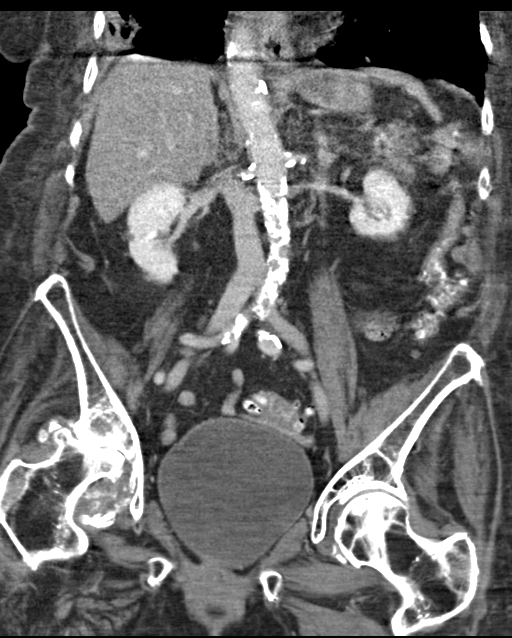

[16 of 46 positions shown; findings below may reference images not displayed]

FINDINGS: Lower chest:  Reported on dedicated chest CT.

Hepatobiliary: Possible steatosis of the liver.At least 1 calcified
gallstone. No findings of cholecystitis. No bile duct dilatation.

Pancreas: Unremarkable.

Spleen: Unremarkable.

Adrenals/Urinary Tract: Negative adrenals. No hydronephrosis or
stone. Unremarkable bladder.

Stomach/Bowel: Rectocele appearance. No bowel obstruction or visible
inflammation. Numerous left colonic diverticula. Negative appendix.

Vascular/Lymphatic: Diffuse atheromatous calcification with multiple
narrowed visceral arteries, not well assessed on this study. No mass
or adenopathy.

Reproductive:Hysterectomy.  Pelvic floor laxity

Other: Loculated fluid in the low left pelvis, likely peritoneal
inclusion cyst given stability from prior.

Musculoskeletal: Remarkably severe right hip osteoarthritis with
femoral head collapse and fragmentation. Generalized osteopenia and
advanced spinal degeneration.
IMPRESSION: 1. Motion degraded CT without acute finding.
2. Numerous chronic findings are stable from December 2019 and described
above.

## 2021-11-13 IMAGING — CT CT CHEST W/O CM
2 of 4 series · 14 of 36 positions shown, 17 images · non-contrast
Comparison: 04/20/2020

CLINICAL DATA: COPD exacerbation, generalized weakness, sepsis

EXAM:
CT CHEST WITHOUT CONTRAST
TECHNIQUE: Multidetector CT imaging of the chest was performed following the
standard protocol without IV contrast.

[Series 2: cap w/o · axial · non-contrast · 0.77mm/px · z∈[-626,-126]mm · 11 of 122 slices shown, 14 images]
[im 11/122  mediastinal]
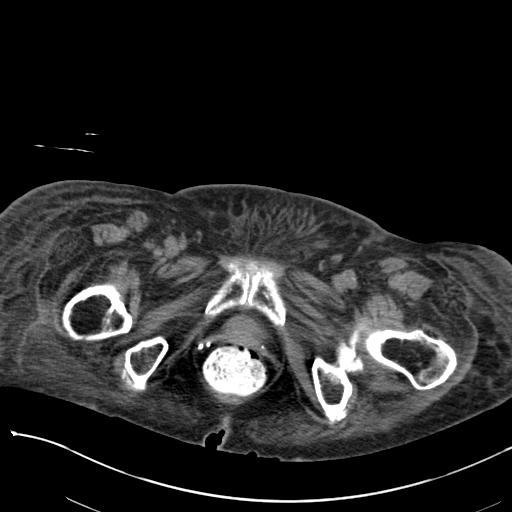
[im 11/122  lung]
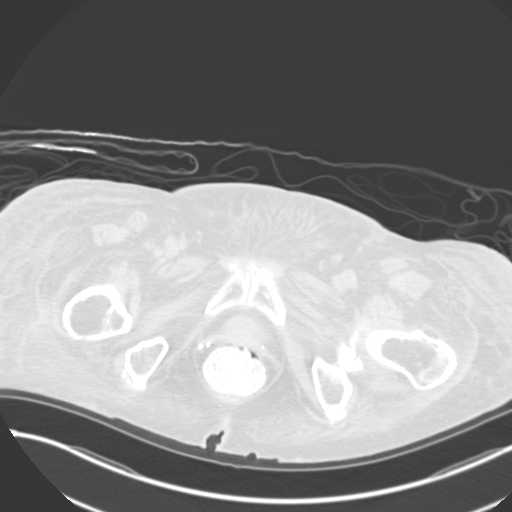
[im 21/122  lung]
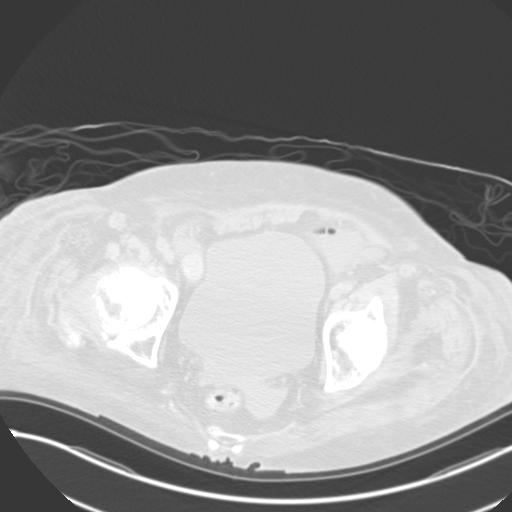
[im 31/122  lung]
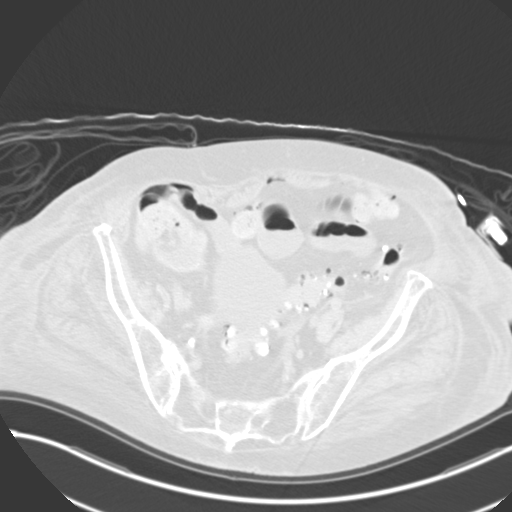
[im 41/122  lung]
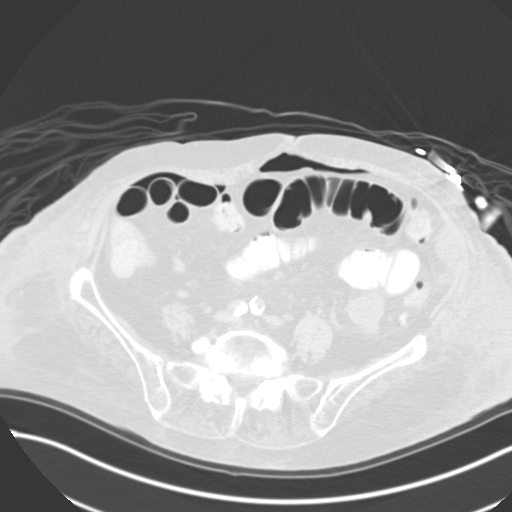
[im 51/122  mediastinal]
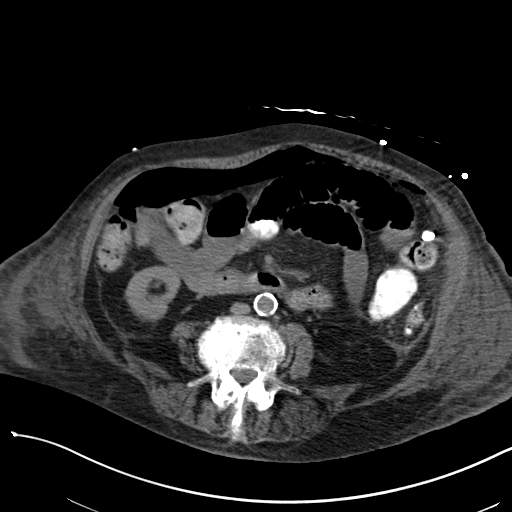
[im 51/122  lung]
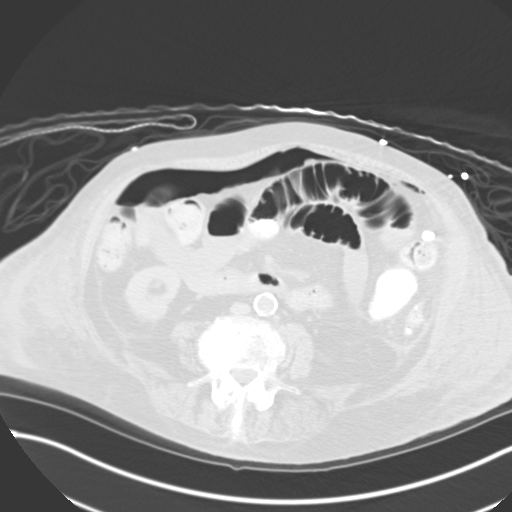
[im 61/122  lung]
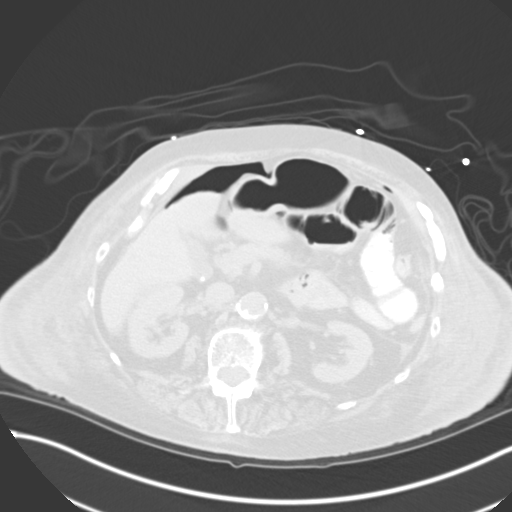
[im 71/122  lung]
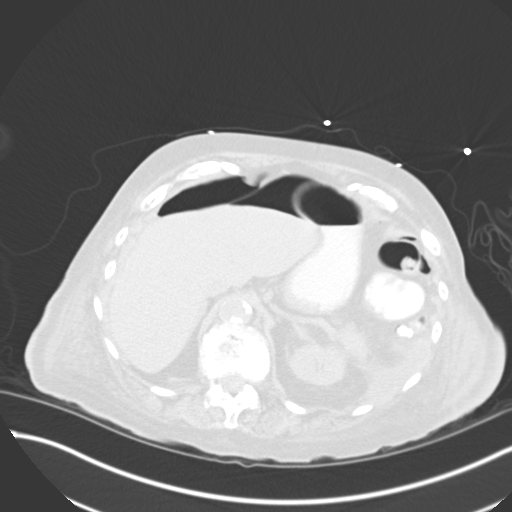
[im 81/122  lung]
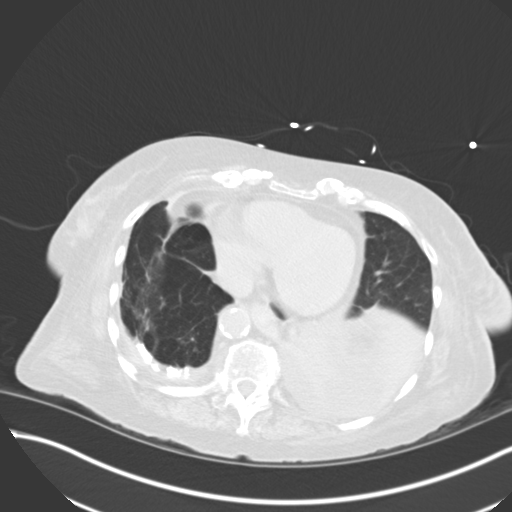
[im 91/122  mediastinal]
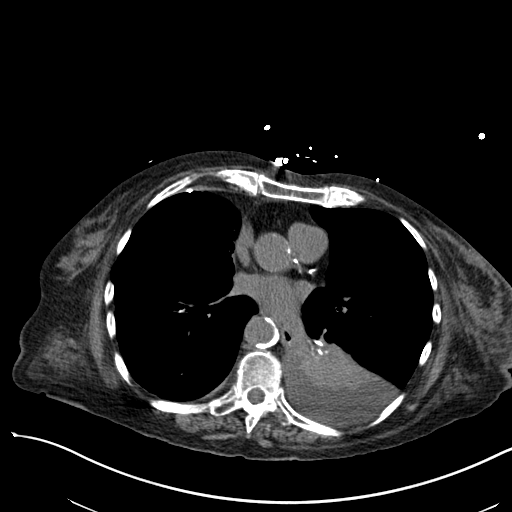
[im 91/122  lung]
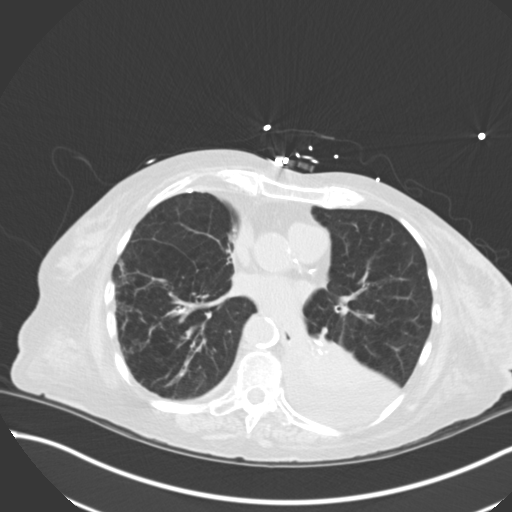
[im 101/122  lung]
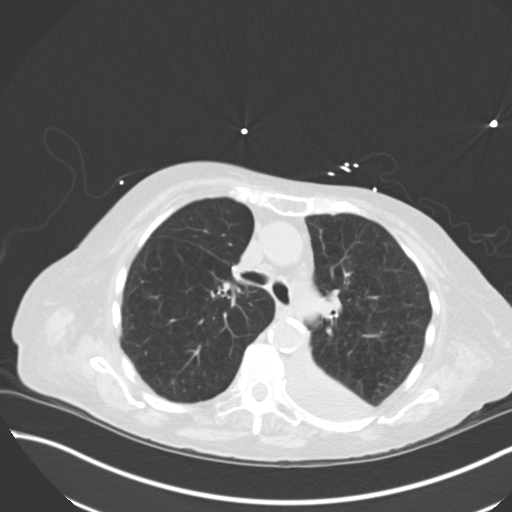
[im 111/122  lung]
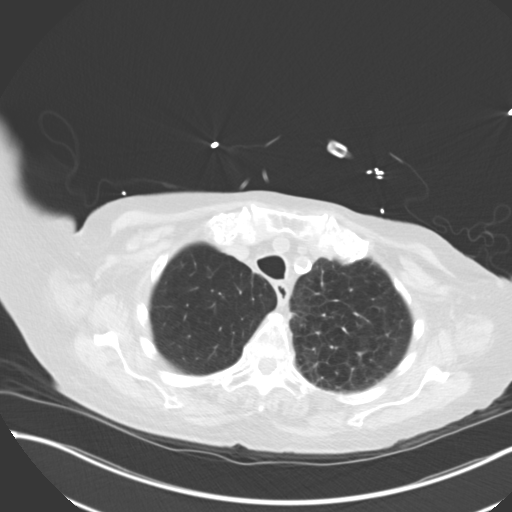

[Series 5: coronals · coronal · 0.82mm/px · 3 of 135 slices shown]
[im 27/135  lung]
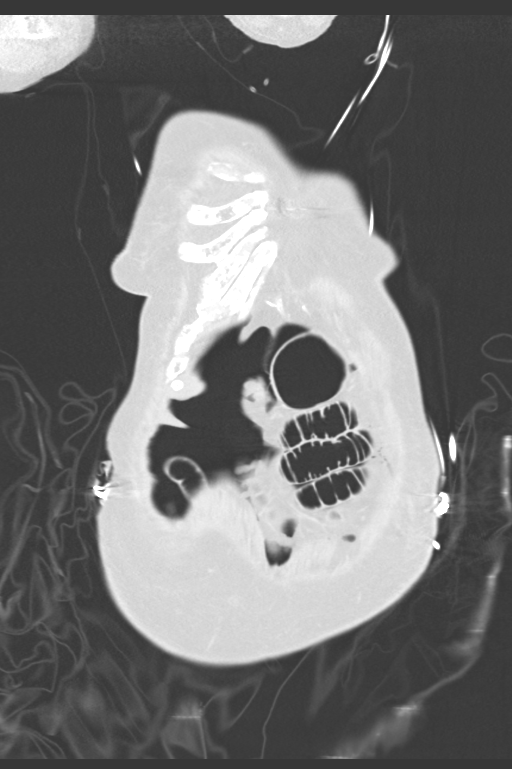
[im 54/135  lung]
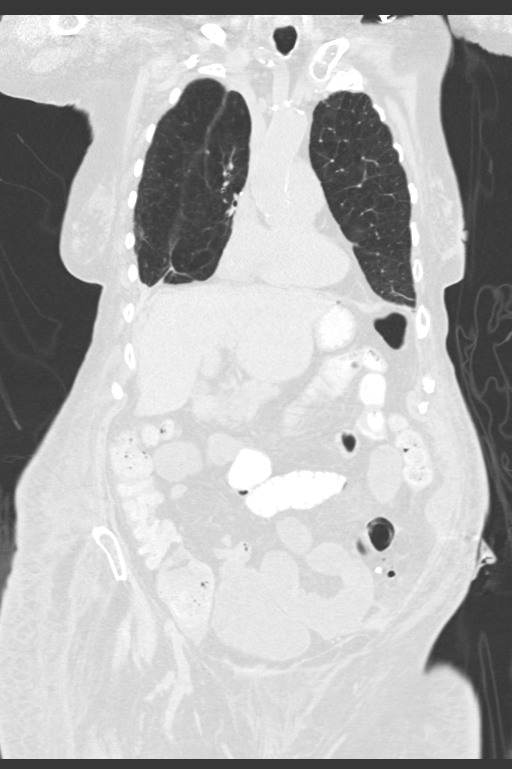
[im 81/135  lung]
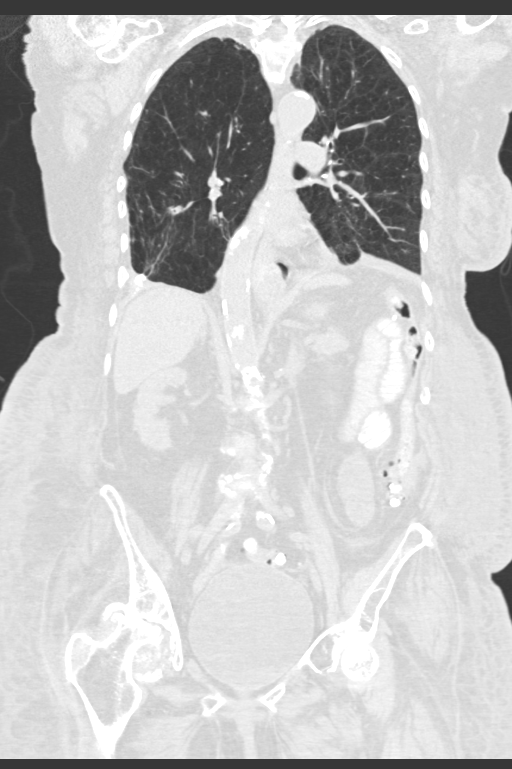

[14 of 36 positions shown; findings below may reference images not displayed]

FINDINGS: Cardiovascular: There is extensive atherosclerosis of the aorta and
coronary vasculature. No pericardial effusion. The heart is not
enlarged.

Mediastinum/Nodes: No enlarged mediastinal or axillary lymph nodes.
Thyroid gland, trachea, and esophagus demonstrate no significant
findings.

Lungs/Pleura: There is diffuse emphysema. Small bilateral pleural
effusions are identified, left greater than right. Dense left lower
lobe consolidation with opacification of the left lower lobe
segmental bronchi, favor atelectasis. No pneumothorax.

Upper Abdomen: Please refer to CT abdomen report for important
findings in that region related to pneumoperitoneum.

Musculoskeletal: No acute or destructive bony lesions. Reconstructed
images demonstrate no additional findings.
IMPRESSION: 1. Left lower lobe consolidation and opacification of left lower
lobe bronchi, favor atelectasis.
2. Small bilateral pleural effusions, left greater than right.
3. Aortic Atherosclerosis ([id number].Q) and Emphysema ([id number]).
4. Please refer to separately reported CT abdomen and pelvis exam
describing important findings in that region.

## 2021-11-13 IMAGING — DX DG ABD PORTABLE 1V
2 series · 2 of 2 positions shown · non-contrast
Comparison: None.

CLINICAL DATA: Generalized abdominal pain.

EXAM:
PORTABLE ABDOMEN - 1 VIEW

[abdomen kub (1 of 2)]
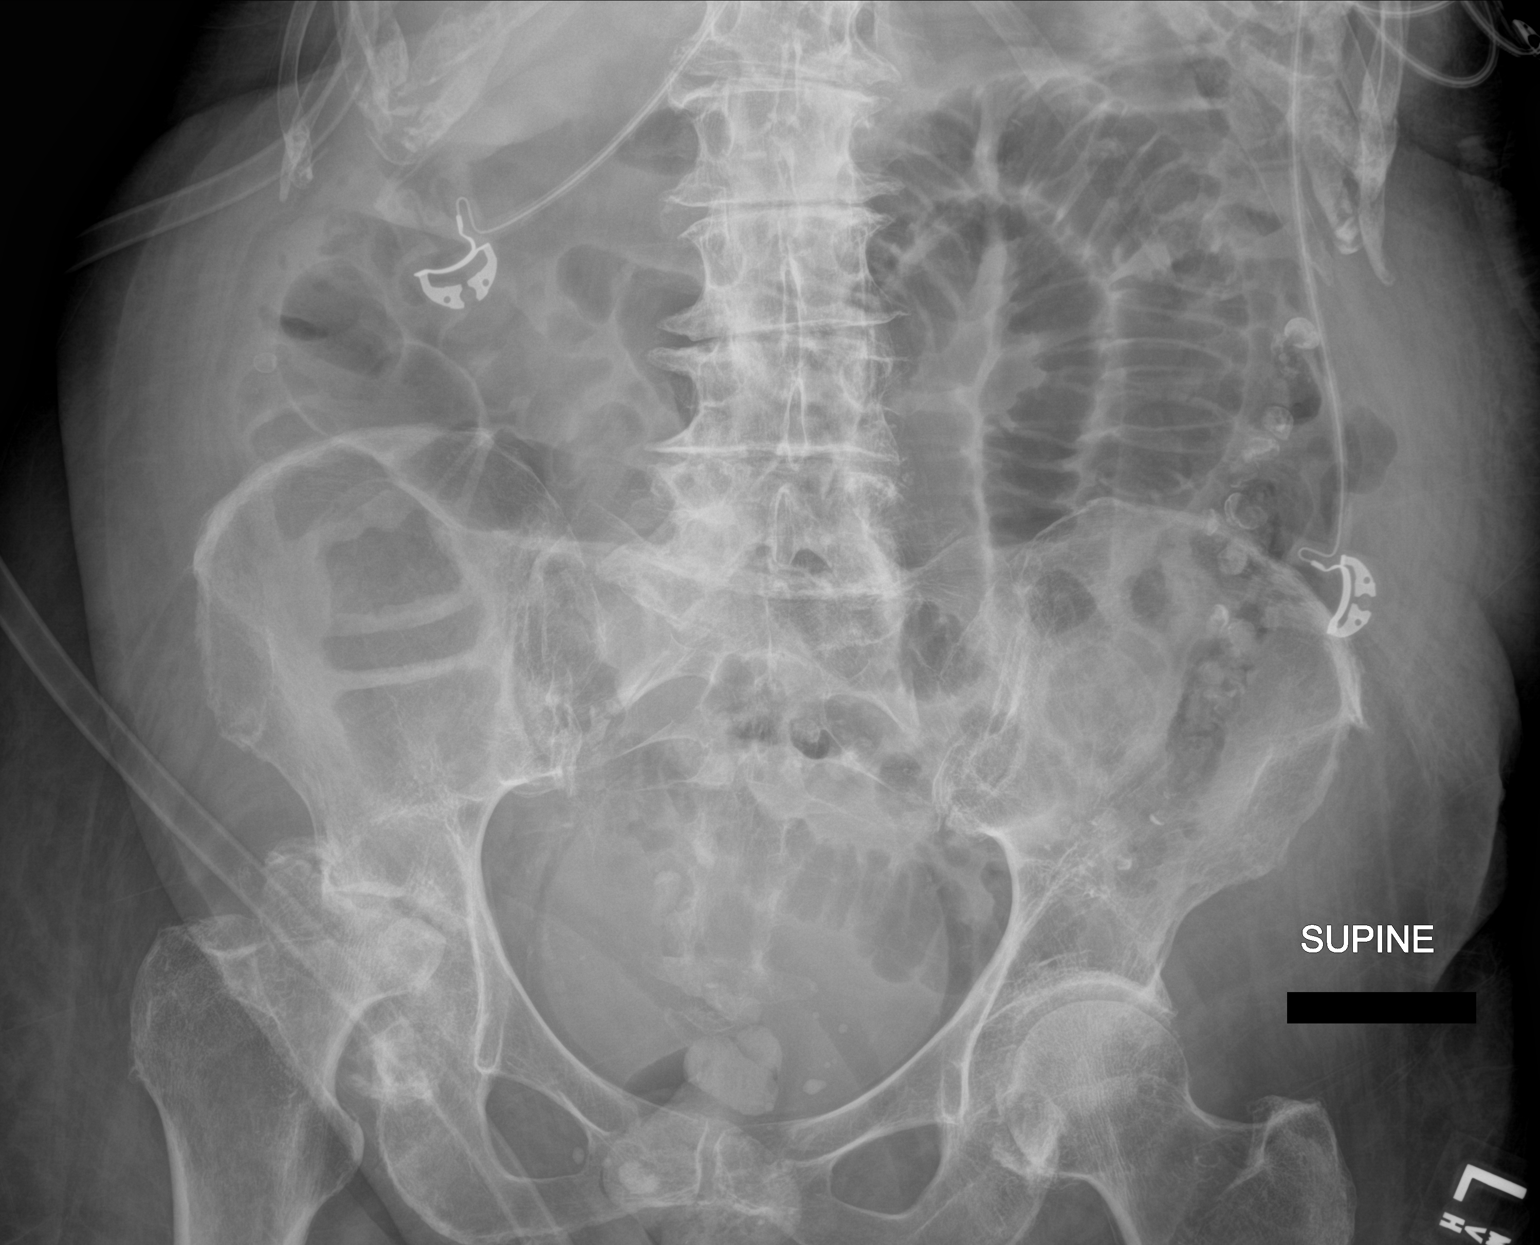

[abdomen kub (2 of 2)]
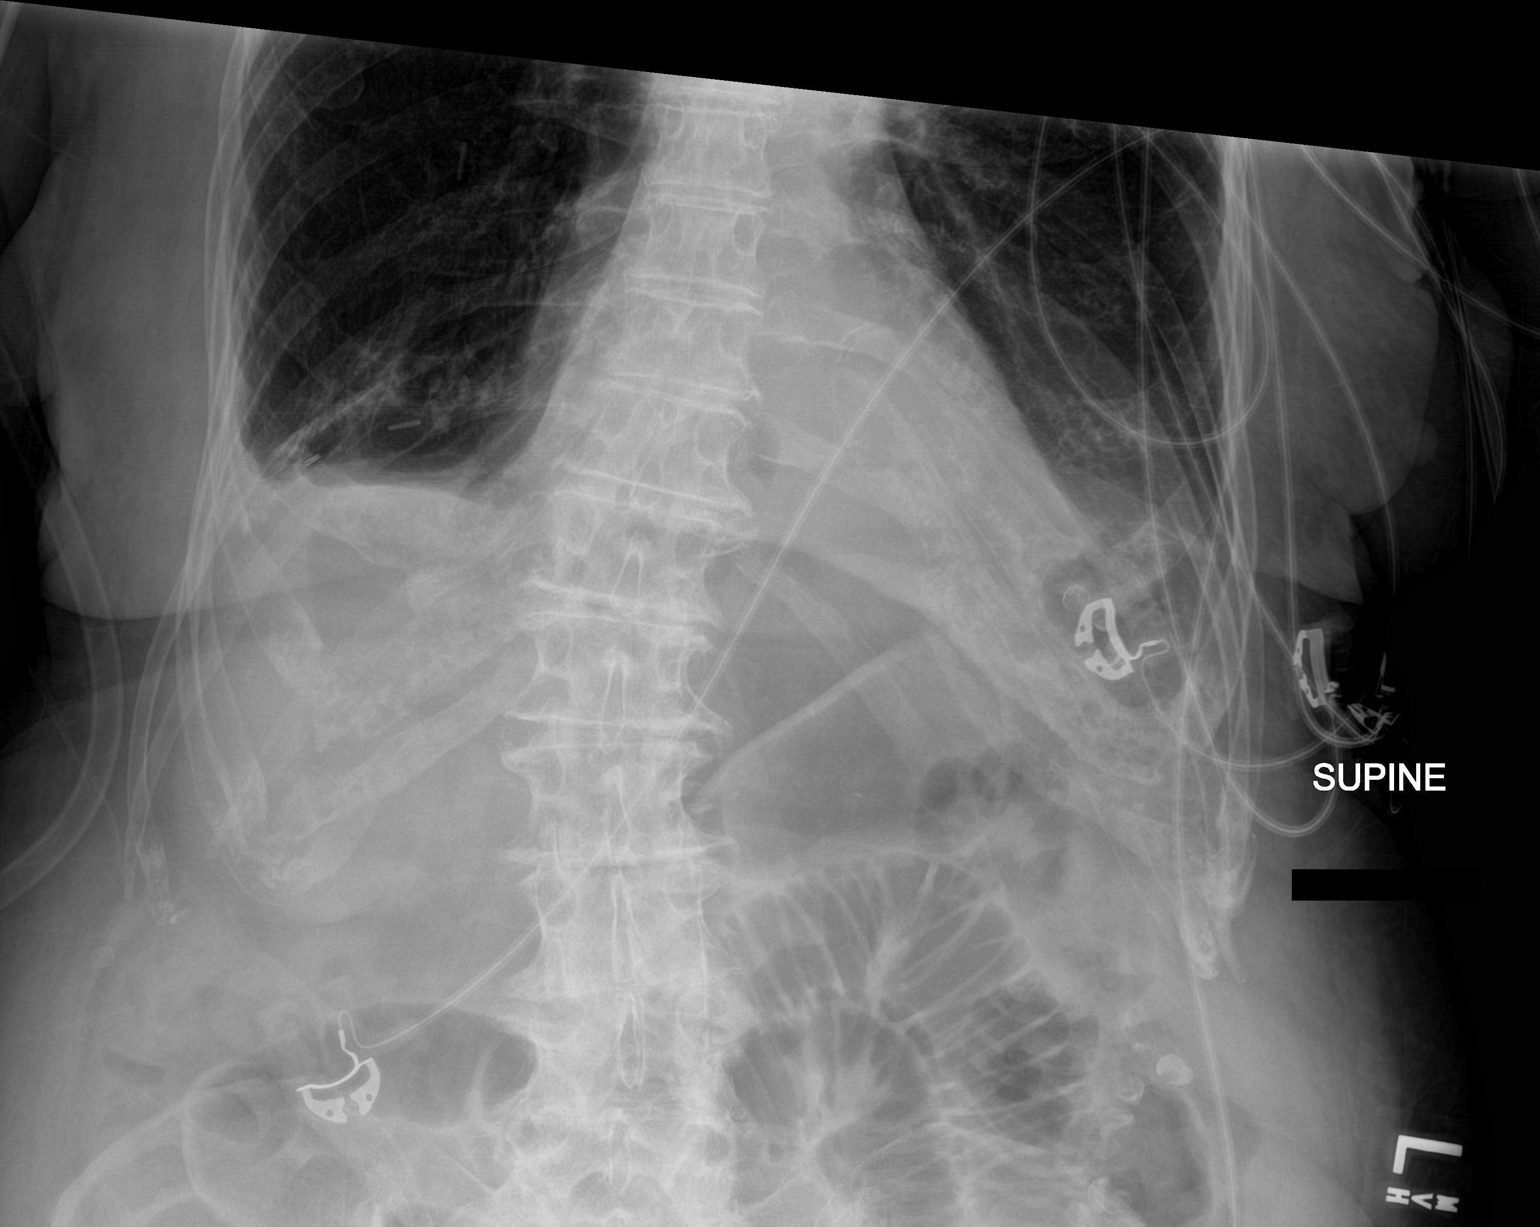

[2 of 2 positions shown; findings below may reference images not displayed]

FINDINGS: Mildly dilated small bowel loops are noted concerning for distal
small bowel obstruction or ileus. No colonic dilatation is noted. No
radio-opaque calculi or other significant radiographic abnormality
are seen.
IMPRESSION: Mildly dilated small bowel loops are noted concerning for distal
small bowel obstruction or ileus.

## 2021-11-13 IMAGING — DX DG CHEST 1V PORT
1 series · 1 of 1 positions shown · non-contrast
Comparison: April 07, 2020.

CLINICAL DATA: Shortness of breath.

EXAM:
PORTABLE CHEST 1 VIEW

[chest ap]
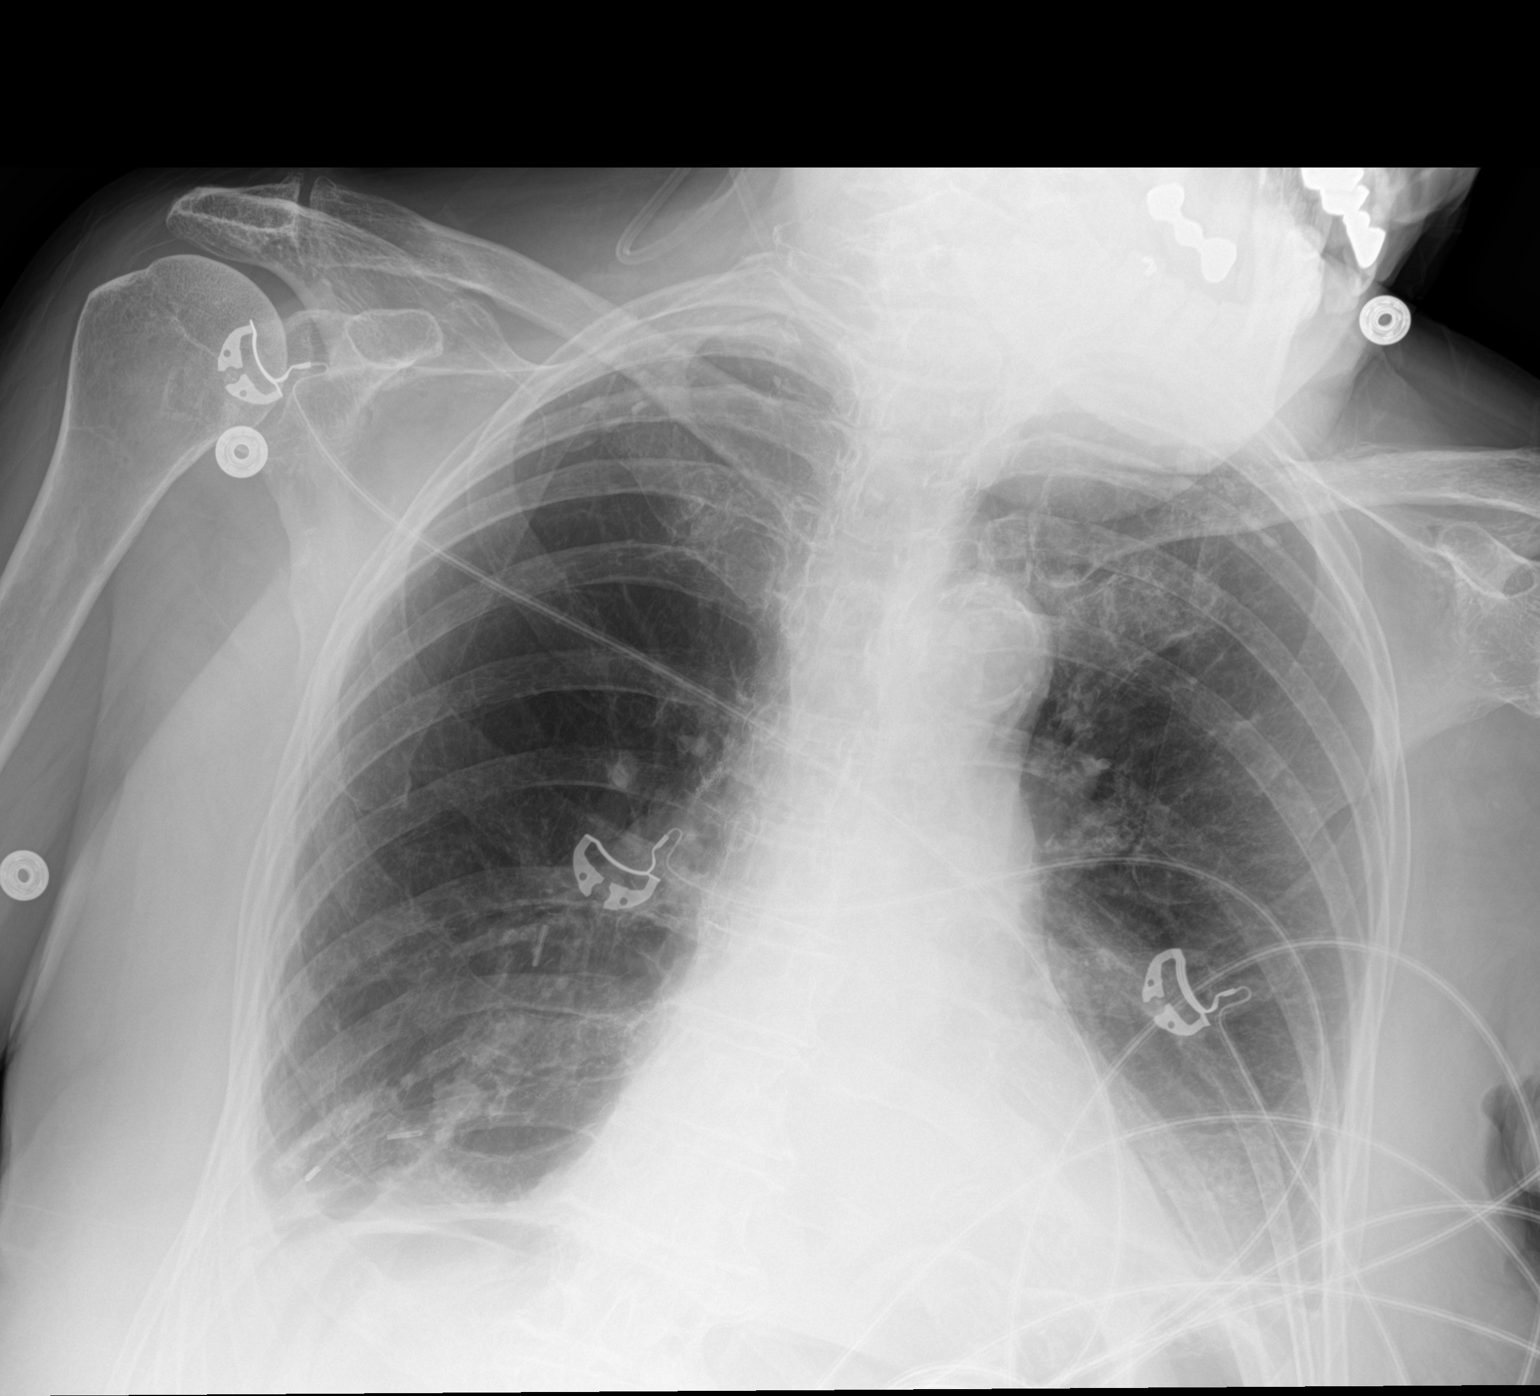

[1 of 1 positions shown; findings below may reference images not displayed]

FINDINGS: Stable cardiomediastinal silhouette. No pneumothorax is noted. Mild
bibasilar subsegmental atelectasis is noted with probable small
pleural effusions. Bony thorax is unremarkable.
IMPRESSION: Mild bibasilar subsegmental atelectasis with probable small pleural
effusions.

Aortic Atherosclerosis (Z4FMW-LD3.3).

## 2021-11-17 IMAGING — DX DG CHEST 1V PORT
1 series · 1 of 1 positions shown · non-contrast
Comparison: 04/20/2020

CLINICAL DATA: Chest tightness and pharyngitis.

EXAM:
PORTABLE CHEST 1 VIEW

[chest ap]
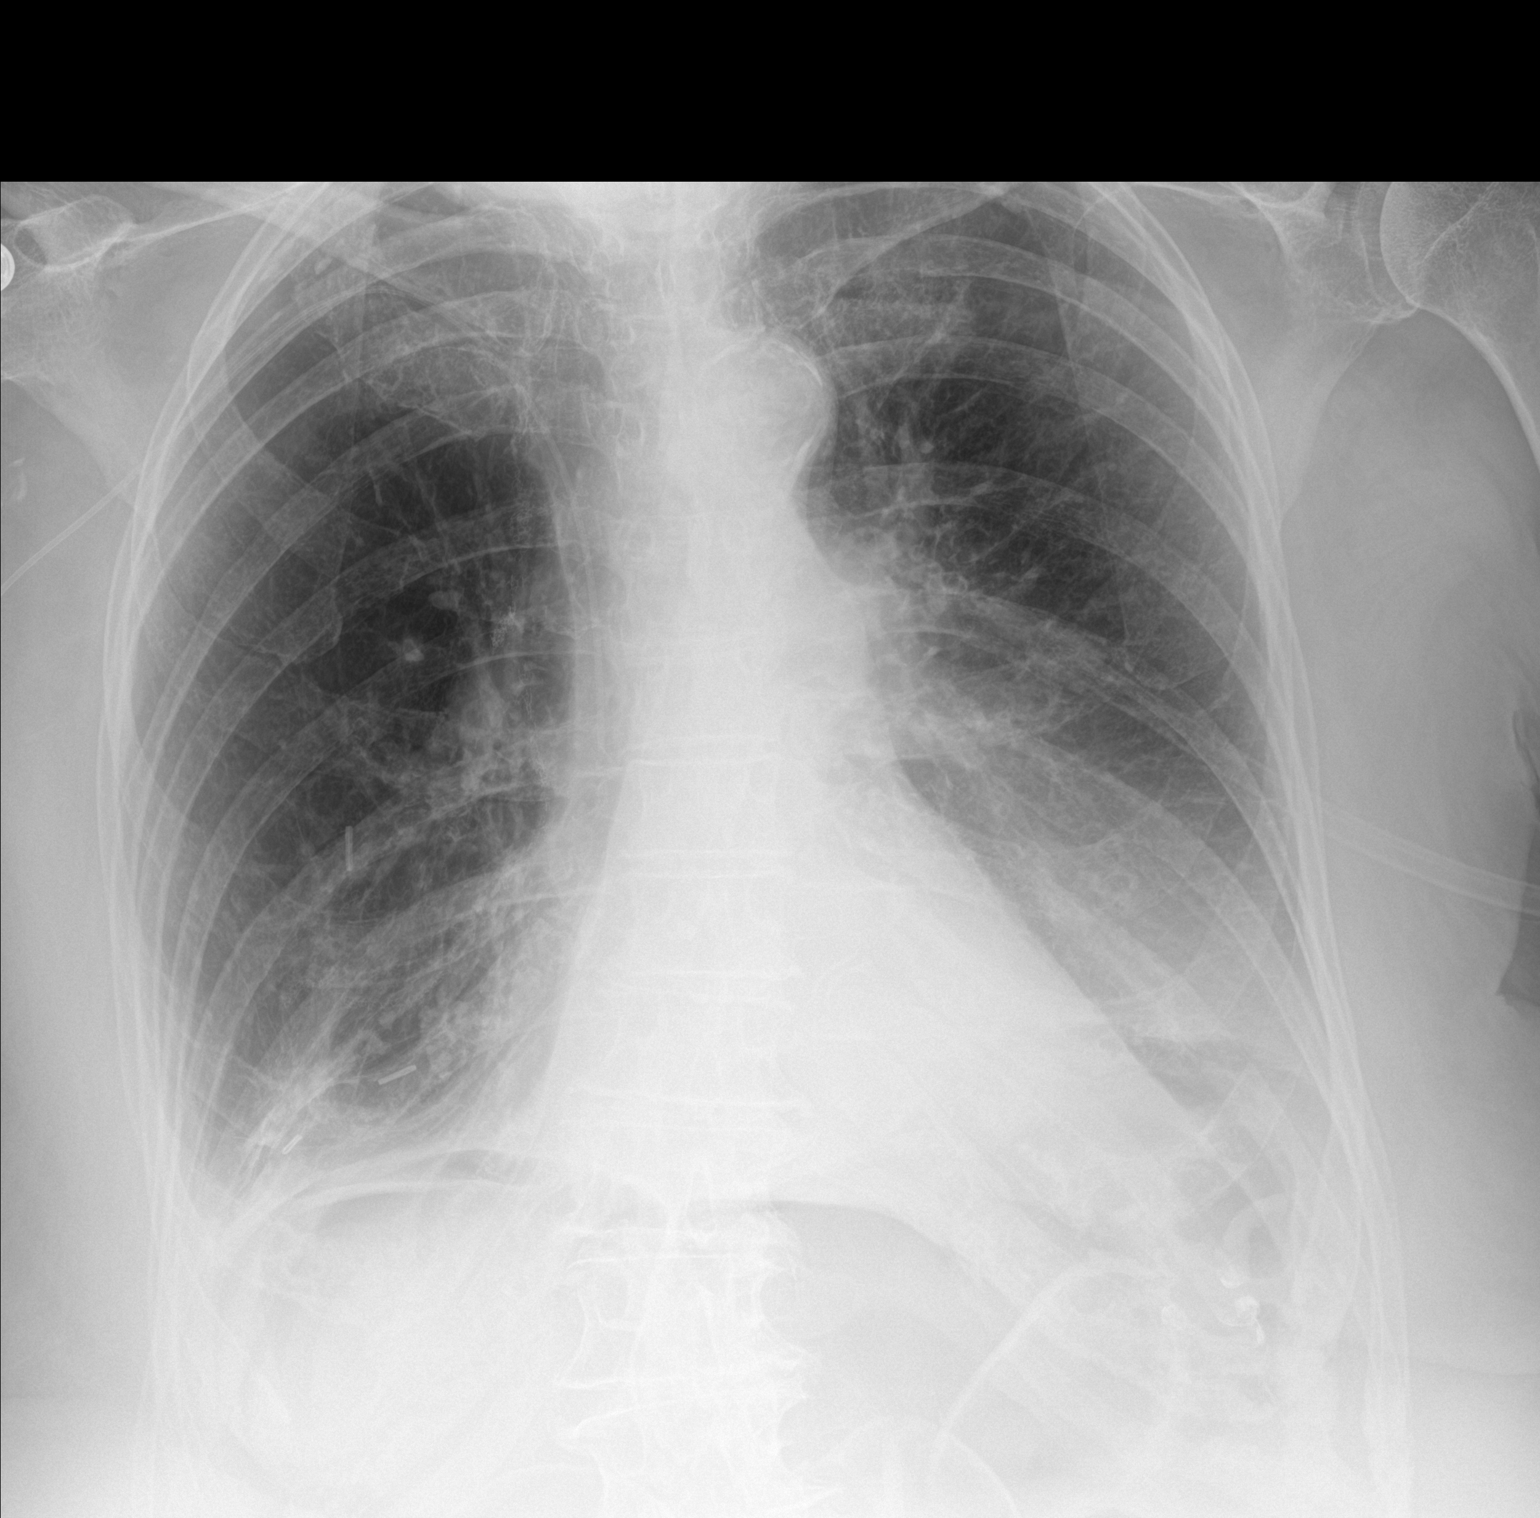

[1 of 1 positions shown; findings below may reference images not displayed]

FINDINGS: Stable mildly enlarged cardiac silhouette, left pleural effusion and
left lower lobe atelectasis. Stable small right pleural effusion and
mild right basilar atelectasis and postsurgical changes. Right PICC
tip in the region of the inferior aspect of the superior vena cava,
approximately 2 cm above the superior cavoatrial junction.
Thoracolumbar spine degenerative changes and mild scoliosis.
IMPRESSION: 1. Stable mildly enlarged cardiac silhouette and bilateral pleural
effusions.
2. Stable left lower lobe atelectasis and mild right basilar
atelectasis and postsurgical changes.

## 2021-11-18 IMAGING — DX DG CHEST 1V PORT
2 series · 2 of 2 positions shown · non-contrast
Comparison: 04/24/2020

CLINICAL DATA: Shortness of breath.

EXAM:
PORTABLE CHEST 1 VIEW

[chest ap (1 of 2)]
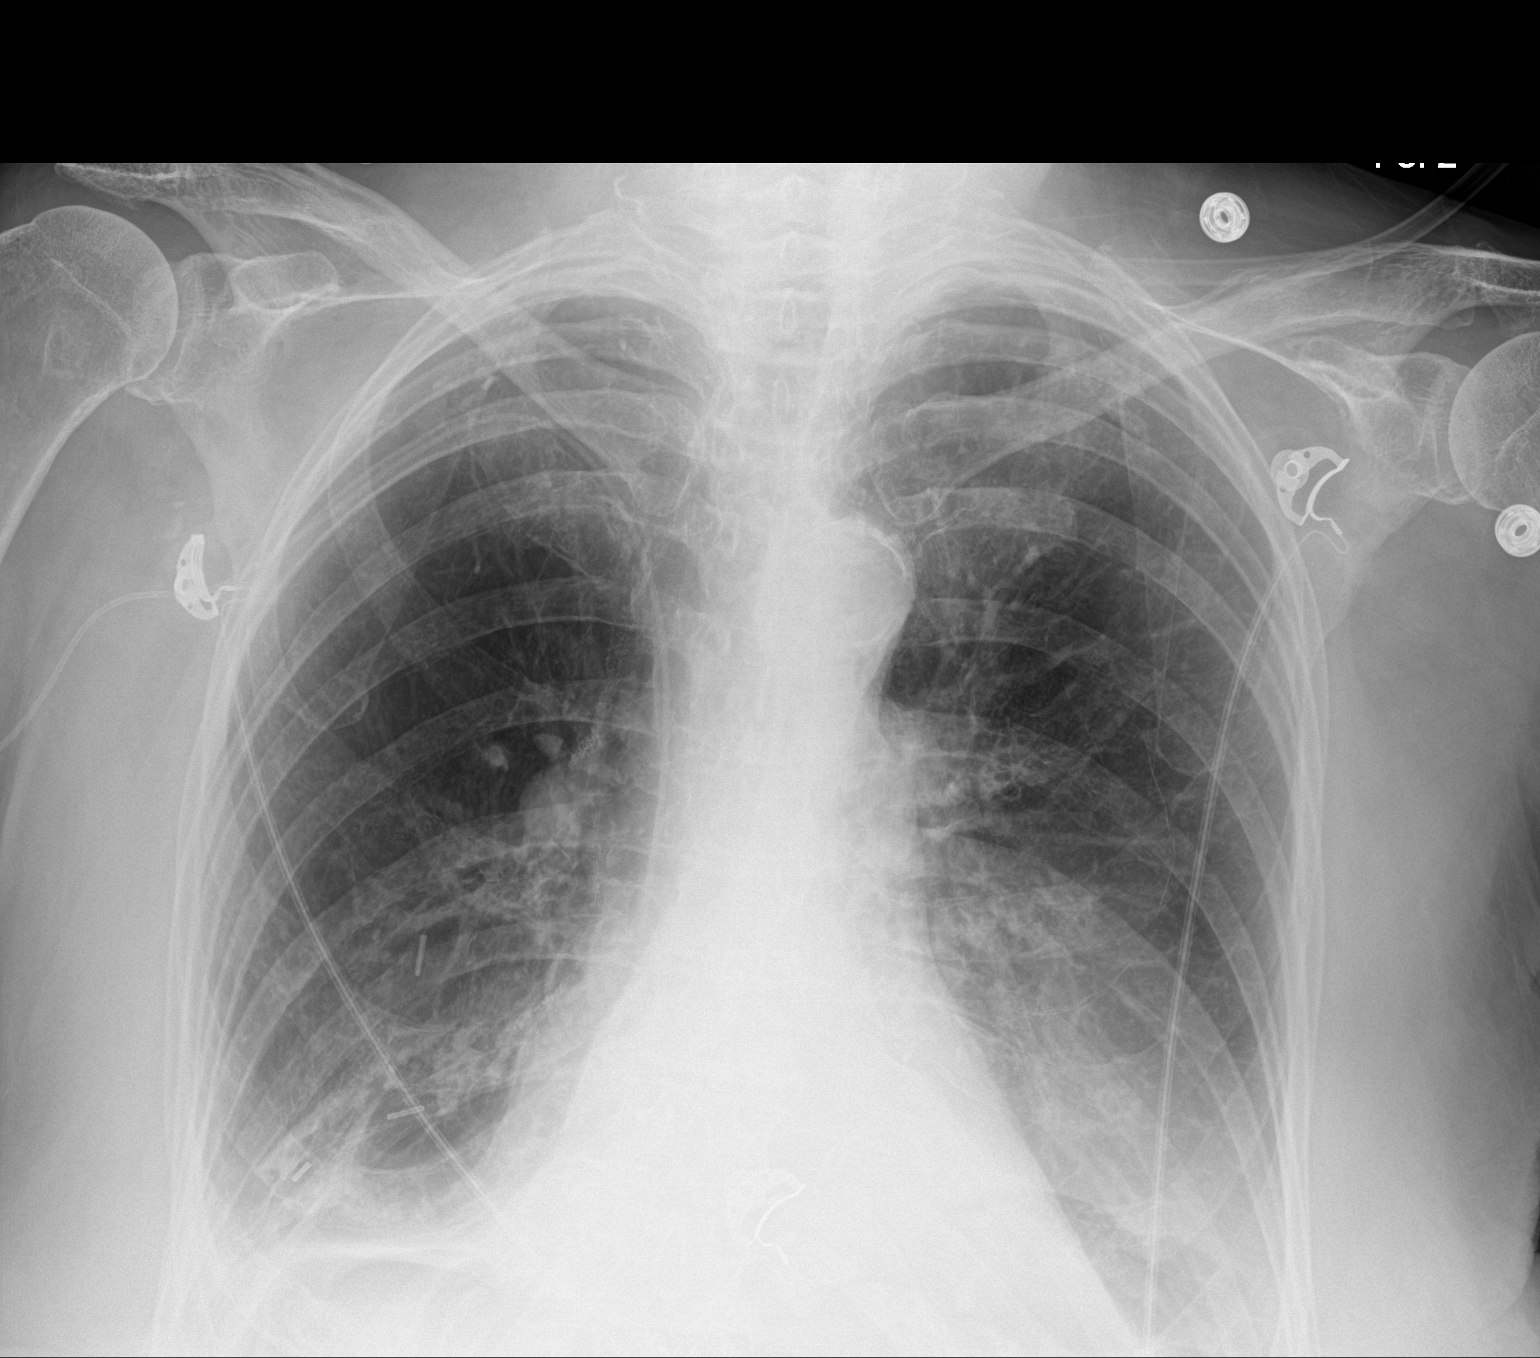

[chest ap (2 of 2)]
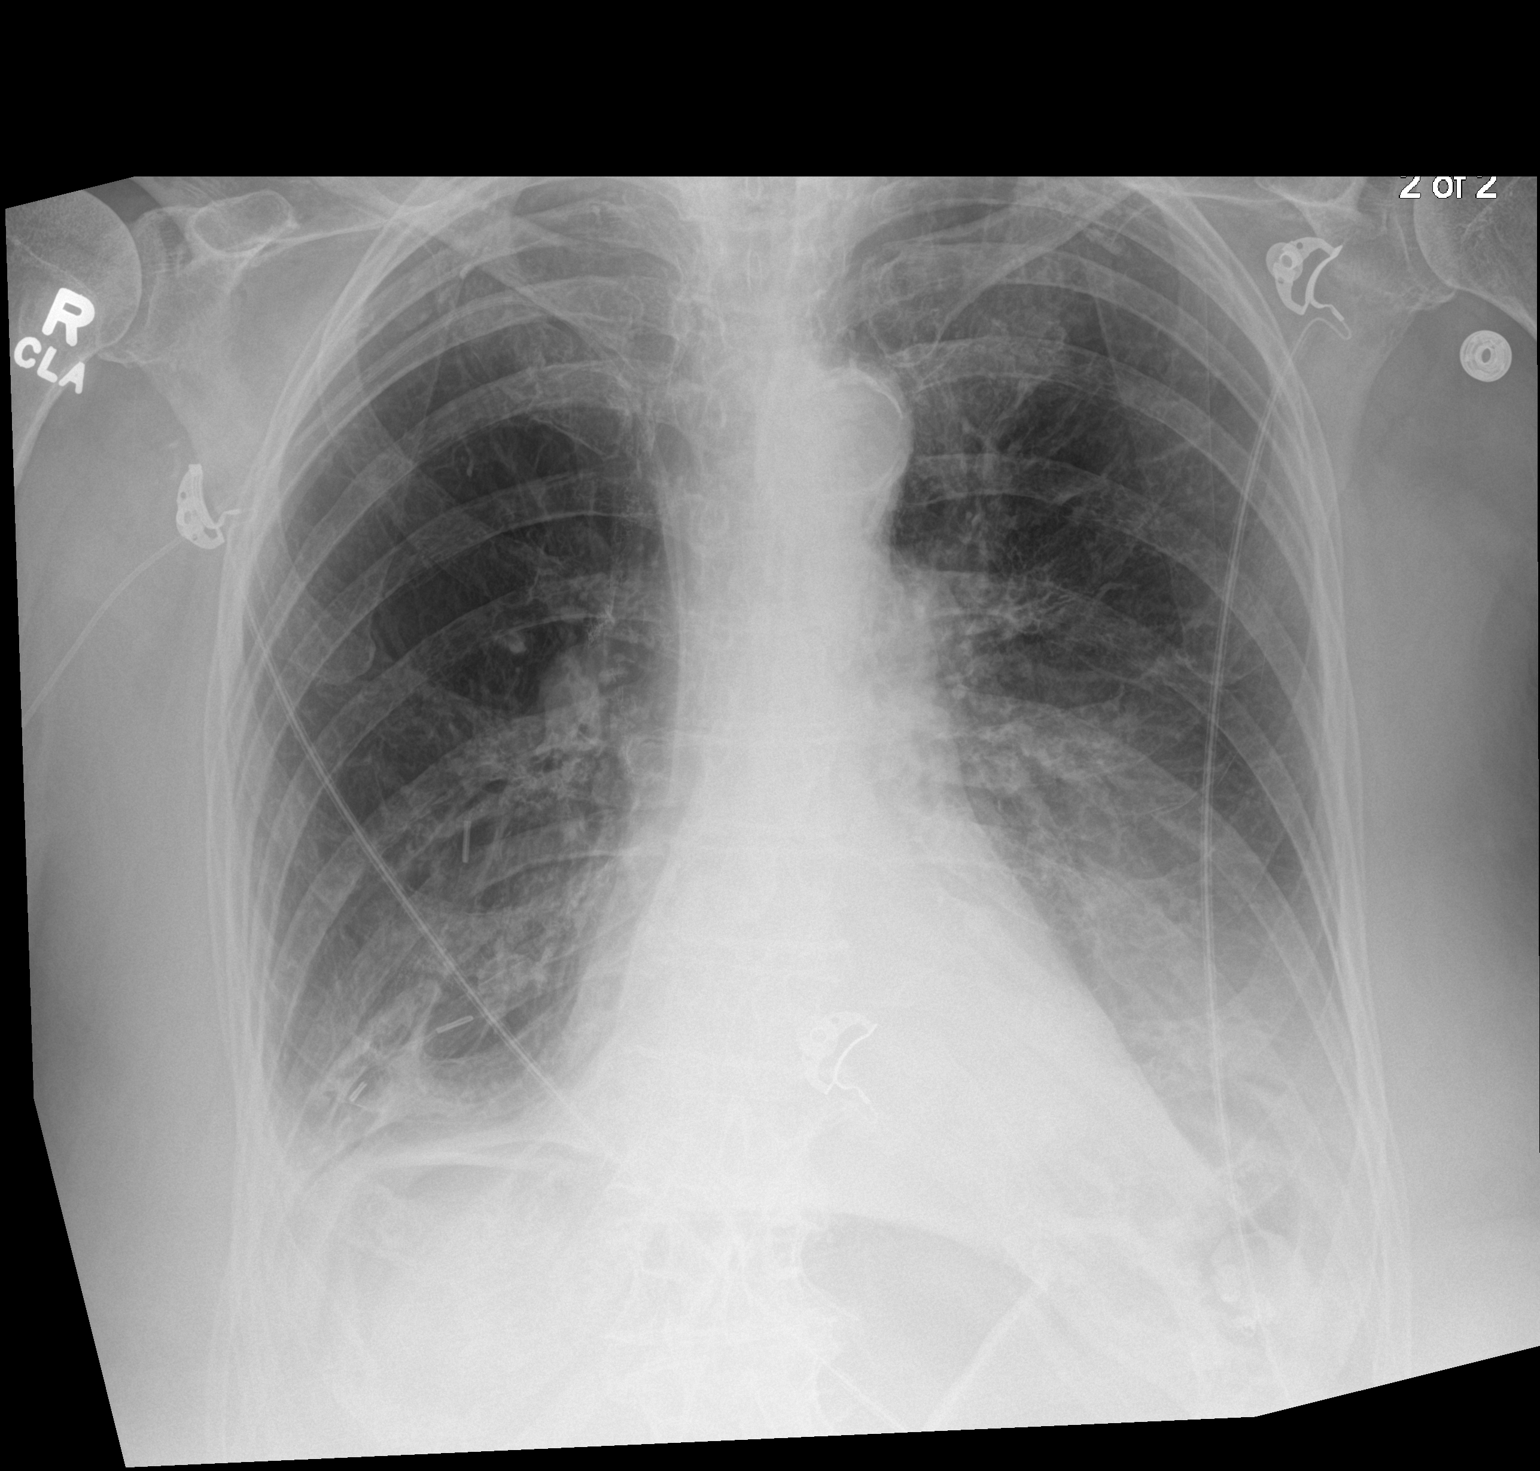

[2 of 2 positions shown; findings below may reference images not displayed]

FINDINGS: Right arm PICC line tip is at the cavoatrial junction. Stable
cardiomediastinal contours. Aortic atherosclerotic calcifications
identified within the aortic arch. No change in complete atelectasis
of the left lower lobe. Chronic pleuroparenchymal scarring and
volume loss involving the right base. Visualized osseous structures
are unremarkable.
IMPRESSION: 1. No change in complete atelectasis of the left lower lobe.
2. Stable chronic pleuroparenchymal scarring and volume loss
involving the right base.
